# Patient Record
Sex: Female | Born: 1935
Health system: Southern US, Community
[De-identification: ages and names within clinical notes are randomized; demographics above are authoritative.]

## PROBLEM LIST (undated history)

## (undated) DIAGNOSIS — I10 Essential (primary) hypertension: Secondary | ICD-10-CM

## (undated) DIAGNOSIS — N189 Chronic kidney disease, unspecified: Secondary | ICD-10-CM

## (undated) DIAGNOSIS — E079 Disorder of thyroid, unspecified: Secondary | ICD-10-CM

## (undated) DIAGNOSIS — M1990 Unspecified osteoarthritis, unspecified site: Secondary | ICD-10-CM

## (undated) DIAGNOSIS — E785 Hyperlipidemia, unspecified: Secondary | ICD-10-CM

## (undated) DIAGNOSIS — H409 Unspecified glaucoma: Secondary | ICD-10-CM

## (undated) DIAGNOSIS — K635 Polyp of colon: Secondary | ICD-10-CM

## (undated) DIAGNOSIS — H269 Unspecified cataract: Secondary | ICD-10-CM

## (undated) DIAGNOSIS — R55 Syncope and collapse: Secondary | ICD-10-CM

## (undated) HISTORY — DX: Chronic kidney disease, unspecified: N18.9

## (undated) HISTORY — DX: Syncope and collapse: R55

## (undated) HISTORY — DX: Unspecified cataract: H26.9

## (undated) HISTORY — PX: INGUINAL HERNIA REPAIR: SUR1180

## (undated) HISTORY — PX: BLADDER SUSPENSION: SHX72

## (undated) HISTORY — PX: COLONOSCOPY: SHX174

## (undated) HISTORY — PX: ABDOMINAL HYSTERECTOMY: SHX81

## (undated) HISTORY — DX: Hyperlipidemia, unspecified: E78.5

## (undated) HISTORY — PX: EYE SURGERY: SHX253

## (undated) HISTORY — DX: Unspecified glaucoma: H40.9

## (undated) HISTORY — DX: Essential (primary) hypertension: I10

## (undated) HISTORY — PX: OTHER SURGICAL HISTORY: SHX169

## (undated) HISTORY — DX: Polyp of colon: K63.5

## (undated) HISTORY — DX: Unspecified osteoarthritis, unspecified site: M19.90

---

## 1998-08-20 ENCOUNTER — Ambulatory Visit (HOSPITAL_COMMUNITY): Admission: RE | Admit: 1998-08-20 | Discharge: 1998-08-20 | Payer: Self-pay | Admitting: Family Medicine

## 1999-09-27 ENCOUNTER — Ambulatory Visit (HOSPITAL_COMMUNITY): Admission: RE | Admit: 1999-09-27 | Discharge: 1999-09-27 | Payer: Self-pay

## 1999-10-18 ENCOUNTER — Encounter: Admission: RE | Admit: 1999-10-18 | Discharge: 2000-01-16 | Payer: Self-pay | Admitting: Family Medicine

## 2000-10-17 ENCOUNTER — Ambulatory Visit (HOSPITAL_COMMUNITY): Admission: RE | Admit: 2000-10-17 | Discharge: 2000-10-17 | Payer: Self-pay | Admitting: Family Medicine

## 2001-01-08 ENCOUNTER — Encounter: Admission: RE | Admit: 2001-01-08 | Discharge: 2001-01-08 | Payer: Self-pay | Admitting: Family Medicine

## 2001-02-05 ENCOUNTER — Encounter: Admission: RE | Admit: 2001-02-05 | Discharge: 2001-02-05 | Payer: Self-pay | Admitting: Family Medicine

## 2001-08-29 ENCOUNTER — Encounter: Admission: RE | Admit: 2001-08-29 | Discharge: 2001-08-29 | Payer: Self-pay | Admitting: Family Medicine

## 2001-09-05 ENCOUNTER — Encounter: Admission: RE | Admit: 2001-09-05 | Discharge: 2001-09-05 | Payer: Self-pay | Admitting: Family Medicine

## 2001-09-07 ENCOUNTER — Encounter: Admission: RE | Admit: 2001-09-07 | Discharge: 2001-10-31 | Payer: Self-pay | Admitting: Sports Medicine

## 2001-09-11 ENCOUNTER — Encounter: Admission: RE | Admit: 2001-09-11 | Discharge: 2001-09-11 | Payer: Self-pay | Admitting: Sports Medicine

## 2001-12-11 ENCOUNTER — Encounter: Admission: RE | Admit: 2001-12-11 | Discharge: 2001-12-11 | Payer: Self-pay | Admitting: Family Medicine

## 2001-12-14 ENCOUNTER — Encounter: Admission: RE | Admit: 2001-12-14 | Discharge: 2001-12-14 | Payer: Self-pay | Admitting: Family Medicine

## 2001-12-20 ENCOUNTER — Encounter: Payer: Self-pay | Admitting: Sports Medicine

## 2001-12-20 ENCOUNTER — Encounter: Admission: RE | Admit: 2001-12-20 | Discharge: 2001-12-20 | Payer: Self-pay | Admitting: Sports Medicine

## 2002-02-19 ENCOUNTER — Encounter (INDEPENDENT_AMBULATORY_CARE_PROVIDER_SITE_OTHER): Payer: Self-pay | Admitting: *Deleted

## 2002-02-19 LAB — CONVERTED CEMR LAB

## 2002-02-27 ENCOUNTER — Encounter: Admission: RE | Admit: 2002-02-27 | Discharge: 2002-02-27 | Payer: Self-pay | Admitting: Family Medicine

## 2002-03-04 ENCOUNTER — Encounter: Payer: Self-pay | Admitting: Sports Medicine

## 2002-03-04 ENCOUNTER — Encounter: Admission: RE | Admit: 2002-03-04 | Discharge: 2002-03-04 | Payer: Self-pay | Admitting: Sports Medicine

## 2002-05-10 ENCOUNTER — Encounter: Admission: RE | Admit: 2002-05-10 | Discharge: 2002-05-10 | Payer: Self-pay | Admitting: Family Medicine

## 2002-06-24 ENCOUNTER — Encounter: Admission: RE | Admit: 2002-06-24 | Discharge: 2002-06-24 | Payer: Self-pay | Admitting: Orthopaedic Surgery

## 2002-06-24 ENCOUNTER — Encounter: Payer: Self-pay | Admitting: Orthopaedic Surgery

## 2002-07-24 ENCOUNTER — Encounter: Admission: RE | Admit: 2002-07-24 | Discharge: 2002-07-24 | Payer: Self-pay | Admitting: Orthopaedic Surgery

## 2002-07-24 ENCOUNTER — Encounter: Payer: Self-pay | Admitting: Orthopaedic Surgery

## 2002-08-28 ENCOUNTER — Encounter: Admission: RE | Admit: 2002-08-28 | Discharge: 2002-08-28 | Payer: Self-pay | Admitting: Family Medicine

## 2002-09-05 ENCOUNTER — Encounter: Admission: RE | Admit: 2002-09-05 | Discharge: 2002-09-05 | Payer: Self-pay | Admitting: Family Medicine

## 2002-09-19 ENCOUNTER — Encounter: Admission: RE | Admit: 2002-09-19 | Discharge: 2002-09-19 | Payer: Self-pay | Admitting: Family Medicine

## 2002-10-11 ENCOUNTER — Encounter: Admission: RE | Admit: 2002-10-11 | Discharge: 2002-10-11 | Payer: Self-pay | Admitting: Family Medicine

## 2002-10-14 ENCOUNTER — Encounter: Admission: RE | Admit: 2002-10-14 | Discharge: 2002-10-14 | Payer: Self-pay | Admitting: Family Medicine

## 2002-11-11 ENCOUNTER — Encounter: Admission: RE | Admit: 2002-11-11 | Discharge: 2002-11-11 | Payer: Self-pay | Admitting: Family Medicine

## 2003-01-09 ENCOUNTER — Encounter: Admission: RE | Admit: 2003-01-09 | Discharge: 2003-01-09 | Payer: Self-pay | Admitting: Sports Medicine

## 2003-02-03 ENCOUNTER — Encounter: Admission: RE | Admit: 2003-02-03 | Discharge: 2003-02-03 | Payer: Self-pay | Admitting: Family Medicine

## 2003-02-25 ENCOUNTER — Encounter: Payer: Self-pay | Admitting: Otolaryngology

## 2003-02-25 ENCOUNTER — Encounter: Admission: RE | Admit: 2003-02-25 | Discharge: 2003-02-25 | Payer: Self-pay | Admitting: Otolaryngology

## 2003-04-11 ENCOUNTER — Emergency Department (HOSPITAL_COMMUNITY): Admission: EM | Admit: 2003-04-11 | Discharge: 2003-04-11 | Payer: Self-pay | Admitting: Emergency Medicine

## 2003-04-21 ENCOUNTER — Encounter: Admission: RE | Admit: 2003-04-21 | Discharge: 2003-04-21 | Payer: Self-pay | Admitting: Sports Medicine

## 2003-05-01 ENCOUNTER — Encounter: Admission: RE | Admit: 2003-05-01 | Discharge: 2003-05-01 | Payer: Self-pay | Admitting: Sports Medicine

## 2003-05-01 ENCOUNTER — Encounter: Payer: Self-pay | Admitting: Sports Medicine

## 2003-06-17 ENCOUNTER — Encounter: Admission: RE | Admit: 2003-06-17 | Discharge: 2003-06-17 | Payer: Self-pay | Admitting: Sports Medicine

## 2003-07-15 ENCOUNTER — Encounter: Admission: RE | Admit: 2003-07-15 | Discharge: 2003-07-15 | Payer: Self-pay | Admitting: Family Medicine

## 2003-12-24 ENCOUNTER — Encounter: Admission: RE | Admit: 2003-12-24 | Discharge: 2003-12-24 | Payer: Self-pay | Admitting: Family Medicine

## 2004-01-21 ENCOUNTER — Encounter: Admission: RE | Admit: 2004-01-21 | Discharge: 2004-01-21 | Payer: Self-pay | Admitting: Sports Medicine

## 2004-02-24 ENCOUNTER — Encounter: Admission: RE | Admit: 2004-02-24 | Discharge: 2004-02-24 | Payer: Self-pay | Admitting: Otolaryngology

## 2004-04-09 ENCOUNTER — Encounter: Admission: RE | Admit: 2004-04-09 | Discharge: 2004-04-09 | Payer: Self-pay | Admitting: Family Medicine

## 2004-06-15 ENCOUNTER — Ambulatory Visit: Payer: Self-pay | Admitting: Family Medicine

## 2004-07-05 ENCOUNTER — Ambulatory Visit: Payer: Self-pay | Admitting: Family Medicine

## 2004-07-27 ENCOUNTER — Ambulatory Visit: Payer: Self-pay | Admitting: Sports Medicine

## 2004-07-30 ENCOUNTER — Encounter: Admission: RE | Admit: 2004-07-30 | Discharge: 2004-07-30 | Payer: Self-pay | Admitting: Sports Medicine

## 2004-11-01 ENCOUNTER — Ambulatory Visit: Payer: Self-pay | Admitting: Sports Medicine

## 2005-01-05 ENCOUNTER — Ambulatory Visit: Payer: Self-pay | Admitting: Family Medicine

## 2005-07-25 ENCOUNTER — Ambulatory Visit: Payer: Self-pay | Admitting: Family Medicine

## 2005-08-04 ENCOUNTER — Ambulatory Visit: Payer: Self-pay | Admitting: Family Medicine

## 2005-08-17 ENCOUNTER — Ambulatory Visit: Payer: Self-pay | Admitting: Family Medicine

## 2005-10-19 ENCOUNTER — Ambulatory Visit: Payer: Self-pay | Admitting: Family Medicine

## 2005-11-04 ENCOUNTER — Encounter: Admission: RE | Admit: 2005-11-04 | Discharge: 2005-11-04 | Payer: Self-pay | Admitting: Sports Medicine

## 2006-01-30 ENCOUNTER — Ambulatory Visit: Payer: Self-pay | Admitting: Family Medicine

## 2006-06-08 ENCOUNTER — Ambulatory Visit: Payer: Self-pay | Admitting: Family Medicine

## 2006-06-13 ENCOUNTER — Ambulatory Visit: Payer: Self-pay | Admitting: Family Medicine

## 2006-06-15 ENCOUNTER — Encounter: Admission: RE | Admit: 2006-06-15 | Discharge: 2006-06-15 | Payer: Self-pay | Admitting: Sports Medicine

## 2006-08-30 ENCOUNTER — Ambulatory Visit: Payer: Self-pay | Admitting: Family Medicine

## 2006-08-31 ENCOUNTER — Encounter (INDEPENDENT_AMBULATORY_CARE_PROVIDER_SITE_OTHER): Payer: Self-pay | Admitting: *Deleted

## 2006-08-31 ENCOUNTER — Ambulatory Visit: Payer: Self-pay | Admitting: Family Medicine

## 2006-08-31 LAB — CONVERTED CEMR LAB
ALT: 23 units/L (ref 0–35)
BUN: 13 mg/dL (ref 6–23)
CO2: 28 meq/L (ref 19–32)
Calcium: 9.3 mg/dL (ref 8.4–10.5)
Chloride: 105 meq/L (ref 96–112)
Cholesterol: 230 mg/dL — ABNORMAL HIGH (ref 0–200)
Creatinine, Ser: 0.8 mg/dL (ref 0.40–1.20)
Glucose, Bld: 71 mg/dL (ref 70–99)
HDL: 82 mg/dL (ref >39)
Total CHOL/HDL Ratio: 2.8

## 2006-10-19 DIAGNOSIS — M159 Polyosteoarthritis, unspecified: Secondary | ICD-10-CM | POA: Insufficient documentation

## 2006-10-19 DIAGNOSIS — I10 Essential (primary) hypertension: Secondary | ICD-10-CM

## 2006-10-19 DIAGNOSIS — H409 Unspecified glaucoma: Secondary | ICD-10-CM | POA: Insufficient documentation

## 2006-10-19 DIAGNOSIS — M1710 Unilateral primary osteoarthritis, unspecified knee: Secondary | ICD-10-CM

## 2006-10-19 DIAGNOSIS — E7800 Pure hypercholesterolemia, unspecified: Secondary | ICD-10-CM

## 2006-10-19 DIAGNOSIS — E1140 Type 2 diabetes mellitus with diabetic neuropathy, unspecified: Secondary | ICD-10-CM | POA: Insufficient documentation

## 2006-10-19 DIAGNOSIS — E1169 Type 2 diabetes mellitus with other specified complication: Secondary | ICD-10-CM | POA: Insufficient documentation

## 2006-10-19 DIAGNOSIS — M8580 Other specified disorders of bone density and structure, unspecified site: Secondary | ICD-10-CM | POA: Insufficient documentation

## 2006-10-19 DIAGNOSIS — E669 Obesity, unspecified: Secondary | ICD-10-CM | POA: Insufficient documentation

## 2006-10-19 DIAGNOSIS — E1159 Type 2 diabetes mellitus with other circulatory complications: Secondary | ICD-10-CM | POA: Insufficient documentation

## 2006-10-20 ENCOUNTER — Encounter (INDEPENDENT_AMBULATORY_CARE_PROVIDER_SITE_OTHER): Payer: Self-pay | Admitting: *Deleted

## 2006-11-03 ENCOUNTER — Telehealth (INDEPENDENT_AMBULATORY_CARE_PROVIDER_SITE_OTHER): Payer: Self-pay | Admitting: *Deleted

## 2006-11-06 ENCOUNTER — Ambulatory Visit: Payer: Self-pay | Admitting: Family Medicine

## 2006-11-06 ENCOUNTER — Encounter (INDEPENDENT_AMBULATORY_CARE_PROVIDER_SITE_OTHER): Payer: Self-pay | Admitting: *Deleted

## 2006-11-09 ENCOUNTER — Encounter (INDEPENDENT_AMBULATORY_CARE_PROVIDER_SITE_OTHER): Payer: Self-pay | Admitting: *Deleted

## 2006-11-09 ENCOUNTER — Encounter: Admission: RE | Admit: 2006-11-09 | Discharge: 2006-11-09 | Payer: Self-pay | Admitting: Sports Medicine

## 2006-11-13 ENCOUNTER — Encounter: Admission: RE | Admit: 2006-11-13 | Discharge: 2006-11-13 | Payer: Self-pay | Admitting: Sports Medicine

## 2006-11-13 ENCOUNTER — Encounter (INDEPENDENT_AMBULATORY_CARE_PROVIDER_SITE_OTHER): Payer: Self-pay | Admitting: *Deleted

## 2006-11-15 ENCOUNTER — Encounter (INDEPENDENT_AMBULATORY_CARE_PROVIDER_SITE_OTHER): Payer: Self-pay | Admitting: *Deleted

## 2006-12-12 ENCOUNTER — Ambulatory Visit: Payer: Self-pay | Admitting: Family Medicine

## 2006-12-12 ENCOUNTER — Encounter (INDEPENDENT_AMBULATORY_CARE_PROVIDER_SITE_OTHER): Payer: Self-pay | Admitting: *Deleted

## 2006-12-13 ENCOUNTER — Encounter (INDEPENDENT_AMBULATORY_CARE_PROVIDER_SITE_OTHER): Payer: Self-pay | Admitting: *Deleted

## 2007-01-19 ENCOUNTER — Emergency Department (HOSPITAL_COMMUNITY): Admission: EM | Admit: 2007-01-19 | Discharge: 2007-01-19 | Payer: Self-pay | Admitting: Emergency Medicine

## 2007-02-12 ENCOUNTER — Ambulatory Visit: Payer: Self-pay | Admitting: Gastroenterology

## 2007-02-16 ENCOUNTER — Encounter: Payer: Self-pay | Admitting: Gastroenterology

## 2007-02-16 ENCOUNTER — Ambulatory Visit: Payer: Self-pay | Admitting: Gastroenterology

## 2007-02-16 DIAGNOSIS — D126 Benign neoplasm of colon, unspecified: Secondary | ICD-10-CM

## 2007-06-20 ENCOUNTER — Emergency Department (HOSPITAL_COMMUNITY): Admission: EM | Admit: 2007-06-20 | Discharge: 2007-06-21 | Payer: Self-pay | Admitting: Emergency Medicine

## 2007-06-27 ENCOUNTER — Ambulatory Visit: Payer: Self-pay | Admitting: Family Medicine

## 2007-07-02 ENCOUNTER — Encounter (INDEPENDENT_AMBULATORY_CARE_PROVIDER_SITE_OTHER): Payer: Self-pay | Admitting: *Deleted

## 2007-07-02 ENCOUNTER — Ambulatory Visit: Payer: Self-pay | Admitting: Family Medicine

## 2007-07-03 LAB — CONVERTED CEMR LAB
Albumin: 4.1 g/dL (ref 3.5–5.2)
CO2: 27 meq/L (ref 19–32)
Calcium: 9.2 mg/dL (ref 8.4–10.5)
Chloride: 102 meq/L (ref 96–112)
Direct LDL: 103 mg/dL — ABNORMAL HIGH
Glucose, Bld: 154 mg/dL — ABNORMAL HIGH (ref 70–99)
Sodium: 140 meq/L (ref 135–145)
Total Bilirubin: 0.6 mg/dL (ref 0.3–1.2)
Total Protein: 6.6 g/dL (ref 6.0–8.3)

## 2007-08-01 ENCOUNTER — Telehealth: Payer: Self-pay | Admitting: *Deleted

## 2007-08-06 ENCOUNTER — Telehealth: Payer: Self-pay | Admitting: *Deleted

## 2007-08-20 ENCOUNTER — Telehealth (INDEPENDENT_AMBULATORY_CARE_PROVIDER_SITE_OTHER): Payer: Self-pay | Admitting: *Deleted

## 2007-11-02 ENCOUNTER — Encounter (INDEPENDENT_AMBULATORY_CARE_PROVIDER_SITE_OTHER): Payer: Self-pay | Admitting: *Deleted

## 2007-11-02 ENCOUNTER — Ambulatory Visit: Payer: Self-pay | Admitting: Family Medicine

## 2007-11-02 DIAGNOSIS — L659 Nonscarring hair loss, unspecified: Secondary | ICD-10-CM | POA: Insufficient documentation

## 2007-11-02 LAB — CONVERTED CEMR LAB: Hgb A1c MFr Bld: 7.3 %

## 2007-11-03 ENCOUNTER — Encounter (INDEPENDENT_AMBULATORY_CARE_PROVIDER_SITE_OTHER): Payer: Self-pay | Admitting: *Deleted

## 2008-02-18 ENCOUNTER — Encounter: Admission: RE | Admit: 2008-02-18 | Discharge: 2008-02-18 | Payer: Self-pay | Admitting: *Deleted

## 2008-03-14 ENCOUNTER — Ambulatory Visit: Payer: Self-pay | Admitting: Family Medicine

## 2008-03-14 LAB — CONVERTED CEMR LAB: Hgb A1c MFr Bld: 8.7 %

## 2008-04-01 ENCOUNTER — Ambulatory Visit: Payer: Self-pay | Admitting: Family Medicine

## 2008-04-01 LAB — CONVERTED CEMR LAB

## 2008-04-07 ENCOUNTER — Encounter: Payer: Self-pay | Admitting: Family Medicine

## 2008-04-07 ENCOUNTER — Telehealth: Payer: Self-pay | Admitting: Family Medicine

## 2008-04-07 LAB — CONVERTED CEMR LAB
OCCULT 1: NEGATIVE
OCCULT 2: NEGATIVE
OCCULT 3: NEGATIVE

## 2008-06-09 ENCOUNTER — Ambulatory Visit: Payer: Self-pay | Admitting: Family Medicine

## 2008-06-09 ENCOUNTER — Telehealth (INDEPENDENT_AMBULATORY_CARE_PROVIDER_SITE_OTHER): Payer: Self-pay | Admitting: *Deleted

## 2008-06-09 LAB — CONVERTED CEMR LAB
Bilirubin Urine: NEGATIVE
Epithelial cells, urine: <5 /lpf
Glucose, Urine, Semiquant: >=1000
Ketones, urine, test strip: NEGATIVE
Specific Gravity, Urine: 1.025
pH: 6.5

## 2008-06-10 ENCOUNTER — Encounter: Payer: Self-pay | Admitting: Family Medicine

## 2008-06-19 ENCOUNTER — Encounter: Payer: Self-pay | Admitting: Family Medicine

## 2008-07-21 ENCOUNTER — Telehealth: Payer: Self-pay | Admitting: *Deleted

## 2008-07-21 ENCOUNTER — Ambulatory Visit: Payer: Self-pay | Admitting: Family Medicine

## 2008-07-21 LAB — CONVERTED CEMR LAB
Nitrite: NEGATIVE
Specific Gravity, Urine: 1.01
Urobilinogen, UA: 1
WBC Urine, dipstick: NEGATIVE

## 2008-07-22 ENCOUNTER — Encounter: Payer: Self-pay | Admitting: Family Medicine

## 2009-01-09 ENCOUNTER — Ambulatory Visit (HOSPITAL_COMMUNITY): Admission: RE | Admit: 2009-01-09 | Discharge: 2009-01-09 | Payer: Self-pay | Admitting: Urology

## 2009-02-03 ENCOUNTER — Ambulatory Visit (HOSPITAL_COMMUNITY): Admission: RE | Admit: 2009-02-03 | Discharge: 2009-02-04 | Payer: Self-pay | Admitting: Urology

## 2009-04-01 ENCOUNTER — Encounter: Admission: RE | Admit: 2009-04-01 | Discharge: 2009-04-01 | Payer: Self-pay | Admitting: Family Medicine

## 2009-04-01 ENCOUNTER — Telehealth: Payer: Self-pay | Admitting: Family Medicine

## 2009-04-03 ENCOUNTER — Ambulatory Visit: Payer: Self-pay | Admitting: Family Medicine

## 2009-04-03 ENCOUNTER — Ambulatory Visit (HOSPITAL_COMMUNITY): Admission: RE | Admit: 2009-04-03 | Discharge: 2009-04-03 | Payer: Self-pay | Admitting: Family Medicine

## 2009-04-03 ENCOUNTER — Encounter: Payer: Self-pay | Admitting: Family Medicine

## 2009-04-03 DIAGNOSIS — E1140 Type 2 diabetes mellitus with diabetic neuropathy, unspecified: Secondary | ICD-10-CM

## 2009-04-03 DIAGNOSIS — B351 Tinea unguium: Secondary | ICD-10-CM | POA: Insufficient documentation

## 2009-04-03 LAB — CONVERTED CEMR LAB
BUN: 16 mg/dL (ref 6–23)
CO2: 28 meq/L (ref 19–32)
Calcium: 9.2 mg/dL (ref 8.4–10.5)
Chloride: 104 meq/L (ref 96–112)
Cholesterol: 214 mg/dL — ABNORMAL HIGH (ref 0–200)
Creatinine, Ser: 0.99 mg/dL (ref 0.40–1.20)
Glucose, Bld: 169 mg/dL — ABNORMAL HIGH (ref 70–99)
HCT: 36.8 % (ref 36.0–46.0)
HDL: 82 mg/dL (ref >39)
Hemoglobin: 12 g/dL (ref 12.0–15.0)
Hgb A1c MFr Bld: 7.9 %
LDL Cholesterol: 124 mg/dL — ABNORMAL HIGH (ref 0–99)
MCHC: 32.6 g/dL (ref 30.0–36.0)
MCV: 95.6 fL (ref 78.0–100.0)
Platelets: 202 10*3/uL (ref 150–400)
Potassium: 4.3 meq/L (ref 3.5–5.3)
RBC: 3.85 M/uL — ABNORMAL LOW (ref 3.87–5.11)
RDW: 13.6 % (ref 11.5–15.5)
Sodium: 141 meq/L (ref 135–145)
Total CHOL/HDL Ratio: 2.6
Triglycerides: 42 mg/dL (ref <150)
VLDL: 8 mg/dL (ref 0–40)
WBC: 3.2 10*3/uL — ABNORMAL LOW (ref 4.0–10.5)

## 2009-04-06 ENCOUNTER — Encounter: Payer: Self-pay | Admitting: Family Medicine

## 2009-04-07 ENCOUNTER — Encounter: Admission: RE | Admit: 2009-04-07 | Discharge: 2009-04-07 | Payer: Self-pay | Admitting: Family Medicine

## 2009-04-07 ENCOUNTER — Encounter: Payer: Self-pay | Admitting: Family Medicine

## 2009-06-15 ENCOUNTER — Encounter: Payer: Self-pay | Admitting: *Deleted

## 2009-07-29 ENCOUNTER — Telehealth: Payer: Self-pay | Admitting: Family Medicine

## 2009-08-18 ENCOUNTER — Telehealth: Payer: Self-pay | Admitting: Family Medicine

## 2009-10-05 ENCOUNTER — Ambulatory Visit: Payer: Self-pay | Admitting: Family Medicine

## 2009-10-06 ENCOUNTER — Encounter: Admission: RE | Admit: 2009-10-06 | Discharge: 2009-10-06 | Payer: Self-pay | Admitting: Family Medicine

## 2009-12-02 ENCOUNTER — Encounter: Admission: RE | Admit: 2009-12-02 | Discharge: 2009-12-02 | Payer: Self-pay | Admitting: Family Medicine

## 2009-12-02 ENCOUNTER — Ambulatory Visit: Payer: Self-pay | Admitting: Family Medicine

## 2009-12-02 ENCOUNTER — Encounter: Payer: Self-pay | Admitting: Family Medicine

## 2009-12-02 LAB — CONVERTED CEMR LAB: Uric Acid, Serum: 4.2 mg/dL (ref 2.4–7.0)

## 2009-12-04 ENCOUNTER — Telehealth: Payer: Self-pay | Admitting: Family Medicine

## 2010-02-01 ENCOUNTER — Ambulatory Visit: Payer: Self-pay | Admitting: Family Medicine

## 2010-02-01 ENCOUNTER — Encounter: Payer: Self-pay | Admitting: Family Medicine

## 2010-02-01 LAB — CONVERTED CEMR LAB
ALT: 22 units/L (ref 0–35)
AST: 21 units/L (ref 0–37)
Albumin: 4 g/dL (ref 3.5–5.2)
Alkaline Phosphatase: 70 units/L (ref 39–117)
BUN: 17 mg/dL (ref 6–23)
Basophils Absolute: 0 10*3/uL (ref 0.0–0.1)
Basophils Relative: 1 % (ref 0–1)
CO2: 26 meq/L (ref 19–32)
Calcium: 9.1 mg/dL (ref 8.4–10.5)
Chloride: 101 meq/L (ref 96–112)
Creatinine, Ser: 0.9 mg/dL (ref 0.40–1.20)
Cyclic Citrullin Peptide Ab: <2 units (ref 0–5)
Eosinophils Absolute: 0.1 10*3/uL (ref 0.0–0.7)
Eosinophils Relative: 3 % (ref 0–5)
Glucose, Bld: 245 mg/dL — ABNORMAL HIGH (ref 70–99)
HCT: 36.2 % (ref 36.0–46.0)
Hemoglobin: 12.1 g/dL (ref 12.0–15.0)
Lymphocytes Relative: 25 % (ref 12–46)
Lymphs Abs: 0.8 10*3/uL (ref 0.7–4.0)
MCHC: 33.4 g/dL (ref 30.0–36.0)
MCV: 92.6 fL (ref 78.0–100.0)
Monocytes Absolute: 0.3 10*3/uL (ref 0.1–1.0)
Monocytes Relative: 10 % (ref 3–12)
Neutro Abs: 2 10*3/uL (ref 1.7–7.7)
Neutrophils Relative %: 62 % (ref 43–77)
Platelets: 231 10*3/uL (ref 150–400)
Potassium: 4.7 meq/L (ref 3.5–5.3)
RBC: 3.91 M/uL (ref 3.87–5.11)
RDW: 13 % (ref 11.5–15.5)
Rhuematoid fact SerPl-aCnc: <20 intl units/mL (ref 0–20)
Sodium: 137 meq/L (ref 135–145)
Total Bilirubin: 0.5 mg/dL (ref 0.3–1.2)
Total Protein: 6.3 g/dL (ref 6.0–8.3)
WBC: 3.2 10*3/uL — ABNORMAL LOW (ref 4.0–10.5)

## 2010-02-03 ENCOUNTER — Telehealth: Payer: Self-pay | Admitting: Family Medicine

## 2010-02-08 ENCOUNTER — Encounter: Admission: RE | Admit: 2010-02-08 | Discharge: 2010-02-08 | Payer: Self-pay | Admitting: Family Medicine

## 2010-02-11 ENCOUNTER — Telehealth: Payer: Self-pay | Admitting: Family Medicine

## 2010-02-17 ENCOUNTER — Ambulatory Visit: Payer: Self-pay | Admitting: Family Medicine

## 2010-02-17 LAB — CONVERTED CEMR LAB
Bilirubin Urine: NEGATIVE
Epithelial cells, urine: UNDETERMINED /lpf
Glucose, Urine, Semiquant: >=1000
Ketones, urine, test strip: NEGATIVE
Nitrite: NEGATIVE
Protein, U semiquant: 30
Specific Gravity, Urine: 1.02
Urobilinogen, UA: 0.2
pH: 5.5

## 2010-02-18 ENCOUNTER — Telehealth: Payer: Self-pay | Admitting: *Deleted

## 2010-02-18 ENCOUNTER — Encounter: Payer: Self-pay | Admitting: Family Medicine

## 2010-02-19 ENCOUNTER — Encounter: Admission: RE | Admit: 2010-02-19 | Discharge: 2010-02-19 | Payer: Self-pay | Admitting: Family Medicine

## 2010-02-25 ENCOUNTER — Encounter: Payer: Self-pay | Admitting: Family Medicine

## 2010-06-29 ENCOUNTER — Encounter: Admission: RE | Admit: 2010-06-29 | Discharge: 2010-06-29 | Payer: Self-pay | Admitting: Family Medicine

## 2010-07-20 ENCOUNTER — Ambulatory Visit: Payer: Self-pay | Admitting: Family Medicine

## 2010-07-20 ENCOUNTER — Telehealth: Payer: Self-pay | Admitting: *Deleted

## 2010-07-20 LAB — CONVERTED CEMR LAB
Bilirubin Urine: NEGATIVE
Glucose, Urine, Semiquant: 100
Ketones, urine, test strip: NEGATIVE
Nitrite: NEGATIVE
Protein, U semiquant: NEGATIVE
Specific Gravity, Urine: 1.015
Urobilinogen, UA: 0.2
pH: 6

## 2010-07-21 ENCOUNTER — Encounter: Payer: Self-pay | Admitting: Family Medicine

## 2010-07-30 ENCOUNTER — Telehealth: Payer: Self-pay | Admitting: Family Medicine

## 2010-09-21 NOTE — Progress Notes (Signed)
Summary: meds prob  Medications Added [facility] ASPIRIN 81 MG CHEW (ASPIRIN) Take 1 tablet by mouth once a day ENALAPRIL MALEATE 20 MG TABS (ENALAPRIL MALEATE) Take 1 tablet by mouth once a day LANTUS 100 UNIT/ML SOLN (INSULIN GLARGINE) Inject 30 unit subcutaneously once a day ZOCOR 40 MG TABS (SIMVASTATIN) Take 1 tablet by mouth at bedtime ZOCOR 80 MG TABS (SIMVASTATIN) One tab by mouth daily SENOKOT S 8.6-50 MG  TABS (SENNOSIDES-DOCUSATE SODIUM) 2 tabs by mouth two times a day DULCOLAX 10 MG  SUPP (BISACODYL) 1 suppository as needed for hard stools or no stools >3 days. MIRALAX   POWD (POLYETHYLENE GLYCOL 3350) 17g by mouth in 8 ounces fluid once daily AIMSCO INSULIN SYR ULTRA THIN 31G X 5/16" 0.3 ML  MISC (INSULIN SYRINGE-NEEDLE U-100) Use for injections as needed KEFLEX 500 MG CAPS (CEPHALEXIN) three times a day for 7 day KEFLEX 500 MG CAPS (CEPHALEXIN) three times a day for 7 day AMOXICILLIN 500 MG CAPS (AMOXICILLIN) three times a day for 7 days( Dx UTI) AMOXICILLIN 500 MG CAPS (AMOXICILLIN) three times a day for 7 days( Dx UTI) AMOXICILLIN 500 MG CAPS (AMOXICILLIN) three times a day for 10 days( Dx UTI) TESSALON PERLES 100 MG CAPS (BENZONATATE) take 1 pill up to 3 times days TESSALON PERLES 100 MG CAPS (BENZONATATE) take 1 pill up to 3 times days MOBIC 7.5 MG TABS (MELOXICAM) 1 tab by mouth daily for knee pain TRAMADOL HCL 50 MG  TABS (TRAMADOL HCL) one by mouth q 6 hours as needed pain KEFLEX 500 MG CAPS (CEPHALEXIN) one by mouth two times a day x 3 days       Phone Note Call from Patient Call back at Home Phone [phone]   Caller: Patient Summary of Call: pt states that the 2 rx's from yesterday went to wrong pharmacy - should be [facility] [address] Initial call taken by: De Nurse,  February 18, 2010 9:31 AM  Follow-up for Phone Call        changed & pt informed Follow-up by: Golden Circle RN,  February 18, 2010 9:41 AM    Prescriptions: KEFLEX 500 MG CAPS  (CEPHALEXIN) one by mouth two times a day x 3 days  #6 x 0   Entered by:   Golden Circle RN   Authorized by:   Helane Rima DO   Signed by:   Golden Circle RN on 02/18/2010   Method used:   Electronically to        [facility] [phone]* (retail)       [address]       [place], [place]  [address]       Ph: [phone]       Fax: [phone]   RxID:   [id number] MOBIC 7.5 MG TABS (MELOXICAM) 1 tab by mouth daily for knee pain  #90 x 0   Entered by:   Golden Circle RN   Authorized by:   Helane Rima DO   Signed by:   Golden Circle RN on 02/18/2010   Method used:   Electronically to        [facility] [phone]* (retail)       [address]       [place], [place]  [address]       Ph: [phone]       Fax: [phone]   RxID:   [id number]

## 2010-09-21 NOTE — Progress Notes (Signed)
Summary: UPDATED PROBLEM LIST

## 2010-09-21 NOTE — Assessment & Plan Note (Signed)
Summary: cloudy,bubbly urine//Kerie Badger   Vital Signs:  Patient profile:   77 year old female Height:      64 inches Weight:      183 pounds BMI:     31.53 Temp:     98.5 degrees F oral Pulse rate:   98 / minute BP sitting:   147 / 86  (left arm) Cuff size:   regular  Vitals Entered By: Tessie Fass CMA (February 17, 2010 3:50 PM) CC: cloudy urine, labs, DM Is Patient Diabetic? Yes Pain Assessment Patient in pain? no        Primary Care Provider:  Helane Rima DO  CC:  cloudy urine, labs, and DM.  History of Present Illness: 77 year old F:   1. Cloudy Urine: concerned about change in urine, denies dysuria, hematuria, abdominal pain, vaginal discharge, lesions, back pain, LE edema, weight gain, CP, SOB, change in BP, dehydration. No Hx of frequent UTIs.  2. Reviewed labs and studies from previous visit. Osteopenia on X-Ray. BG 245.  3. DM: checks BG each am, low 90s, high 140s, Rx Lantus 20 units daily, not watching diet or exercising.  Habits & Providers  Alcohol-Tobacco-Diet     Tobacco Status: quit  Current Medications (verified): 1)  Bayer Childrens Aspirin 81 Mg Chew (Aspirin) .... Take 1 Tablet By Mouth Once A Day 2)  Enalapril Maleate 20 Mg Tabs (Enalapril Maleate) .... Take 1 Tablet By Mouth Once A Day 3)  Lantus 100 Unit/ml Soln (Insulin Glargine) .... Inject 30 Unit Subcutaneously Once A Day 4)  Zocor 80 Mg Tabs (Simvastatin) .... One Tab By Mouth Daily 5)  Senokot S 8.6-50 Mg  Tabs (Sennosides-Docusate Sodium) .... 2 Tabs By Mouth Two Times A Day 6)  Dulcolax 10 Mg  Supp (Bisacodyl) .Marland Kitchen.. 1 Suppository As Needed For Hard Stools or No Stools >3 Days. 7)  Miralax   Powd (Polyethylene Glycol 3350) .Marland Kitchen.. 17g By Mouth in 8 Ounces Fluid Once Daily 8)  Aimsco Insulin Syr Ultra Thin 31g X 5/16" 0.3 Ml  Misc (Insulin Syringe-Needle U-100) .... Use For Injections As Needed 9)  Mobic 7.5 Mg Tabs (Meloxicam) .Marland Kitchen.. 1 Tab By Mouth Daily For Knee Pain 10)  Tramadol Hcl 50 Mg   Tabs (Tramadol Hcl) .... One By Mouth Q 6 Hours As Needed Pain 11)  Keflex 500 Mg Caps (Cephalexin) .... One By Mouth Two Times A Day X 3 Days  Allergies (verified): No Known Drug Allergies PMH-FH-SH reviewed for relevance  Review of Systems      See HPI  Physical Exam  General:  Well-developed,well-nourished,in no acute distress; alert,appropriate and cooperative throughout examination. Vitals reviewed.   Abdomen:  Bowel sounds positive,abdomen soft and non-tender without masses, organomegaly or hernias noted. Extremities:  No edema. Skin:  Intact without suspicious lesions or rashes. Psych:  Oriented X3, memory intact for recent and remote, normally interactive, good eye contact, and not anxious appearing.    Diabetes Management Exam:    Foot Exam (with socks and/or shoes not present):       Sensory-Pinprick/Light touch:          Left medial foot (L-4): normal          Left dorsal foot (L-5): normal          Left lateral foot (S-1): normal          Right medial foot (L-4): normal          Right dorsal foot (L-5): normal  Right lateral foot (S-1): normal       Sensory-Monofilament:          Left foot: normal          Right foot: normal       Inspection:          Left foot: normal          Right foot: normal       Nails:          Left foot: normal          Right foot: normal   Impression & Recommendations:  Problem # 1:  UTI (ICD-599.0) Assessment New  Urine sent for culture. Her updated medication list for this problem includes:    Keflex 500 Mg Caps (Cephalexin) ..... One by mouth two times a day x 3 days  Orders: [facility]- Est  Level 4 ([address]454)  Problem # 2:  DIABETES MELLITUS II, UNCOMPLICATED (ICD-250.00) Assessment: Unchanged  Advised patient to record BG at least two times a day for the next few weeks and to follow-up for review. New A1c will be due then as well.  No change to medications today. Her updated medication list for this problem includes:     Bayer Childrens Aspirin 81 Mg Chew (Aspirin) .Marland Kitchen... Take 1 tablet by mouth once a day    Enalapril Maleate 20 Mg Tabs (Enalapril maleate) .Marland Kitchen... Take 1 tablet by mouth once a day    Lantus 100 Unit/ml Soln (Insulin glargine) ..... Inject 30 unit subcutaneously once a day  Orders: [facility]- Est  Level 4 (09811)  Problem # 3:  OSTEOPENIA (ICD-733.90) Assessment: Unchanged  Ordered DEXA. Advised daily supplement with calcium plus vitamin D.   Orders: [facility]- Est  Level 4 (91478)  Complete Medication List: 1)  Bayer Childrens Aspirin 81 Mg Chew (Aspirin) .... Take 1 tablet by mouth once a day 2)  Enalapril Maleate 20 Mg Tabs (Enalapril maleate) .... Take 1 tablet by mouth once a day 3)  Lantus 100 Unit/ml Soln (Insulin glargine) .... Inject 30 unit subcutaneously once a day 4)  Zocor 80 Mg Tabs (Simvastatin) .... One tab by mouth daily 5)  Senokot S 8.6-50 Mg Tabs (Sennosides-docusate sodium) .... 2 tabs by mouth two times a day 6)  Dulcolax 10 Mg Supp (Bisacodyl) .Marland Kitchen.. 1 suppository as needed for hard stools or no stools >3 days. 7)  Miralax Powd (Polyethylene glycol 3350) .Marland Kitchen.. 17g by mouth in 8 ounces fluid once daily 8)  Aimsco Insulin Syr Ultra Thin 31g X 5/16" 0.3 Ml Misc (Insulin syringe-needle u-100) .... Use for injections as needed 9)  Mobic 7.5 Mg Tabs (Meloxicam) .Marland Kitchen.. 1 tab by mouth daily for knee pain 10)  Tramadol Hcl 50 Mg Tabs (Tramadol hcl) .... One by mouth q 6 hours as needed pain 11)  Keflex 500 Mg Caps (Cephalexin) .... One by mouth two times a day x 3 days  Other Orders: Urinalysis-[facility] (00000) Urine Culture-[facility] ([address]562-13086) Dexa scan (Dexa scan)  Patient Instructions: 1)  You have a Urinary Tract Infection. I am prescribing Keflex for this. 2)  Check your blood sugar two-three times daily or the next 3 weeks and follow up so that we can review them. 3)  We will check your bones for osteoporosis. I suggest taking a vitamin D (800 IU) plus calcium supplement (1200 mg) every  day.  Prescriptions: KEFLEX 500 MG CAPS (CEPHALEXIN) one by mouth two times a day x 3 days  #6 x 0  Entered and Authorized by:   Helane Rima DO   Signed by:   Helane Rima DO on 02/17/2010   Method used:   Electronically to        [facility]  [address] [id number]* (retail)       [address]       [place], [place]  [address]       Ph: [phone]       Fax: [phone]   RxID:   [id number] MOBIC 7.5 MG TABS (MELOXICAM) 1 tab by mouth daily for knee pain  #90 x 0   Entered and Authorized by:   Helane Rima DO   Signed by:   Helane Rima DO on 02/17/2010   Method used:   Print then Give to Patient   RxID:   [id number] MOBIC 7.5 MG TABS (MELOXICAM) 1 tab by mouth daily for knee pain  #90 x 0   Entered and Authorized by:   Helane Rima DO   Signed by:   Helane Rima DO on 02/17/2010   Method used:   Electronically to        [facility]  [address] [id number] (retail)       [address]       [place], [place]  [address]       Ph: [phone]       Fax: [phone]   RxID:   [id number]   Prevention & Chronic Care Immunizations   Influenza vaccine: Fluvax 3+  (06/09/2008)   Influenza vaccine deferral: Not available  (02/17/2010)   Influenza vaccine due: 06/09/2009    Tetanus booster: 08/22/2001: Done.   Tetanus booster due: 08/23/2011    Pneumococcal vaccine: Done.  (08/22/1998)   Pneumococcal vaccine due: None    H. zoster vaccine: Not documented  Colorectal Screening   Hemoccult: normal  (04/07/2008)   Hemoccult action/deferral: Not indicated  (02/17/2010)   Hemoccult due: 04/07/2009    Colonoscopy: Done.  (02/19/2001)   Colonoscopy due: 02/20/2011  Other Screening   Pap smear: Done.  (02/19/2002)   Pap smear due: Not Indicated    Mammogram: BI-RADS CATEGORY 2:  Benign finding(s).^MM DIGITAL DIAG LTD L  (04/07/2009)   Mammogram due: 02/14/2009    DXA bone density scan: Done.  (12/25/2003)   DXA bone density  action/deferral: Ordered  (02/17/2010)   DXA scan due: None    Smoking status: quit  (02/17/2010)  Diabetes Mellitus   HgbA1C: 8.0  (12/02/2009)   Hemoglobin A1C due: 02/02/2008    Eye exam: Not documented    Foot exam: yes  (02/17/2010)   Foot exam action/deferral: Do today   High risk foot: Not documented   Foot care education: Not documented   Foot exam due: 03/14/2009    Urine microalbumin/creatinine ratio: Not documented   Urine microalbumin action/deferral: Not indicated   Urine microalbumin/cr due: 04/01/2009    Diabetes flowsheet reviewed?: Yes   Progress toward A1C goal: Unchanged  Lipids   Total Cholesterol: 214  (04/03/2009)   LDL: 124  (04/03/2009)   LDL Direct: 103  (07/02/2007)   HDL: 82  (04/03/2009)   Triglycerides: 42  (04/03/2009)    SGOT (AST): 21  (02/01/2010)   SGPT (ALT): 22  (02/01/2010)   Alkaline phosphatase: 70  (02/01/2010)   Total bilirubin: 0.5  (02/01/2010)   Liver panel due: 07/04/2009    Lipid flowsheet reviewed?: Yes  Progress toward LDL goal: Unchanged  Hypertension   Last Blood Pressure: 147 / 86  (02/17/2010)   Serum creatinine: 0.90  (02/01/2010)   Serum potassium 4.7  (02/01/2010)    Hypertension flowsheet reviewed?: Yes   Progress toward BP goal: Deteriorated  Self-Management Support :   Personal Goals (by the next clinic visit) :     Personal A1C goal: 7  (02/17/2010)     Personal blood pressure goal: 130/80  (02/17/2010)     Personal LDL goal: 130  (02/17/2010)    Patient will work on the following items until the next clinic visit to reach self-care goals:     Medications and monitoring: take my medicines every day, bring all of my medications to every visit  (02/17/2010)     Eating: drink diet soda or water instead of juice or soda, eat more vegetables, use fresh or frozen vegetables, eat foods that are low in salt, eat baked foods instead of fried foods, eat fruit for snacks and desserts, limit or avoid alcohol   (02/17/2010)     Activity: take a 30 minute walk every day, take the stairs instead of the elevator, park at the far end of the parking lot  (02/17/2010)    Diabetes self-management support: Written self-care plan  (02/17/2010)   Diabetes care plan printed    Hypertension self-management support: Written self-care plan  (02/17/2010)   Hypertension self-care plan printed.    Lipid self-management support: Written self-care plan  (02/17/2010)   Lipid self-care plan printed.   Nursing Instructions: Schedule screening DXA bone density scan (see order)    Laboratory Results   Urine Tests  Date/Time Received: February 17, 2010 4:21 PM  Date/Time Reported: February 17, 2010 4:37 PM   Routine Urinalysis   Color: yellow Appearance: Cloudy Glucose: >=1000   (Normal Range: Negative) Bilirubin: negative   (Normal Range: Negative) Ketone: negative   (Normal Range: Negative) Spec. Gravity: 1.020   (Normal Range: 1.003-1.035) Blood: moderate   (Normal Range: Negative) pH: 5.5   (Normal Range: 5.0-8.0) Protein: 30   (Normal Range: Negative) Urobilinogen: 0.2   (Normal Range: 0-1) Nitrite: negative   (Normal Range: Negative) Leukocyte Esterace: large   (Normal Range: Negative)  Urine Microscopic WBC/HPF: loaded RBC/HPF: few present but unable to count due to large number of RBC's Bacteria/HPF: cocci in chains Epithelial/HPF: unable to see through [facility]    Comments: Ellery Plunk MD  February 17, 2010 4:39 PM

## 2010-09-21 NOTE — Assessment & Plan Note (Signed)
Summary: diabetes wp   Vital Signs:  Patient Profile:   77 Years Old Female Height:     62 inches Weight:      182.13 pounds BMI:     33.43 Temp:     98.4 degrees F Pulse rate:   80 / minute BP sitting:   122 / 70  Pt. in pain?   no  Vitals Entered By: Dennison Nancy RN (March 14, 2008 1:42 PM)              Is Patient Diabetic? Yes      PCP:  Angeline Slim MD   History of Present Illness: Hayley Fisher is a very pleasant 77 year old african [place] lady with:  1. DM, controlled with insulin: Hayley Fisher has been a diabetic for > 10 years. She checks her blood sugar every am and pm. She notes that her am sugars are normally 120-150 and that her pm sugars run a little higher at 150-200. She has had a few episodes of low blood sugar in the am. During those, she feels weak and shakey until she is able to get breakfast.   EYE: she sees her optho twice yearly and has an appt in Sept. FOOT: She checks her feet regularly.  2. HTN: The patient's BP was 122/70 in clinic today. She says that when she checks it at home it is usually good like today. She takes her Enalapril consistently and needs a new prescription today.  3. HLD: She has been on Zocor. Looking back in her records, I saw that she stopped taking this med earlier in the year as she was afraid that the Zocor was causing hair loss. She states that she has been back on the medication since speaking with her previous doctor.   4. Weight gain: Hayley Fisher says that she has gained weight over the past few months, but that she has been exercising regularly for the past few weeks. She does aerobics and/or weights MWF and walks on TTh.   5. Peripheral neuropathy: She does note some numbness and tingling in her hands and feet.     Diabetes Management History:      She says that she is exercising.       Prior Medications Reviewed Using: Patient Recall  Prior Medication List:  BAYER CHILDRENS ASPIRIN 81 MG CHEW (ASPIRIN) Take 1  tablet by mouth once a day ENALAPRIL MALEATE 20 MG TABS (ENALAPRIL MALEATE) Take 1 tablet by mouth once a day LANTUS 100 UNIT/ML SOLN (INSULIN GLARGINE) Inject 30 unit subcutaneously once a day ZOCOR 80 MG TABS (SIMVASTATIN) One tab by mouth daily SENOKOT S 8.6-50 MG  TABS (SENNOSIDES-DOCUSATE SODIUM) 2 tabs by mouth two times a day DULCOLAX 10 MG  SUPP (BISACODYL) 1 suppository as needed for hard stools or no stools >3 days. 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       Post menopause       DM shoes 2005       cardiolite- low risk, QGS=71% - 06/22/2004       HTN  HLD       Obesity       Peripheral Neuropathy  Past Surgical History:    Reviewed history from 12/19/2007 and no changes required:       Inguinal hernia age 13 -       laser cataract repair - 11/20/2004       TAH age 66 -       tympanostomy tube L ear - 08/23/2003       Sinus surgery       Hysterectomy       Hernia Surgery   Family History:    Reviewed history from 10/19/2006 and no changes required:       DTR has hypothyroidism, Father died of HTN and CVA age 52, Mother died of Colon CA age 32, siblings have DM  Social History:    Reviewed history from 10/19/2006 and no changes required:       Remote tobacco use, quit > 20 years ago.  No etoh use. Lives with her sister in a house in [place] with central air. Has four children.  Retired 2/05 and enjoying church work and gardening.  Son was a [employer] and captain of [employer] team Hayley Fisher) who played in the [employer] in the 1980s.   Risk Factors:  Tobacco use:  quit Drug use:  no Alcohol use:  no Exercise:   yes   Review of Systems       The patient complains of weight gain.  The patient denies fever, vision loss, chest pain, syncope, dyspnea on exertion, peripheral edema, prolonged cough, headaches, abdominal pain, severe indigestion/heartburn, muscle weakness, difficulty walking, and depression.     Physical Exam  General:     alert, well-developed, well-nourished, well-hydrated, appropriate dress, normal appearance, healthy-appearing, cooperative to examination, and good hygiene.   Head:     normocephalic and atraumatic.   Eyes:     vision grossly intact, pupils equal, pupils round, and pupils reactive to light.   Ears:     R ear normal and L ear normal.   Neck:     supple, full ROM, and no masses.   Lungs:     normal respiratory effort, normal breath sounds, no dullness, no crackles, and no wheezes.   Heart:     normal rate, regular rhythm, no murmur, no gallop, and no rub.   Abdomen:     soft, non-tender, normal bowel sounds, and no masses.   Msk:     normal ROM and no joint tenderness.   Pulses:     R dorsalis pedis normal and L dorsalis pedis normal.   Neurologic:     alert & oriented X3 and cranial nerves II-XII intact.   Psych:     Oriented X3.    Diabetes Management Exam:    Foot Exam (with socks and/or shoes not present):       Sensory-Pinprick/Light touch:          Left medial foot (L-4): normal          Left dorsal foot (L-5): normal          Left lateral foot (S-1): normal       Sensory-Monofilament:          Left foot: normal       Inspection:          Left foot: normal       Nails:          Left foot: normal  Impression & Recommendations:  Problem # 1:  DIABETES MELLITUS II, UNCOMPLICATED (ICD-250.00) Her A1 c was elevated at 8.7 today. We discussed her diet and new exercise routine. I will follow-up with another A1c in 3 months. No medication change at this time. Her updated medication list for this problem includes:    Bayer Childrens Aspirin 81  Mg Chew (Aspirin) .Marland Kitchen... Take 1 tablet by mouth once a day    Enalapril Maleate 20 Mg Tabs (Enalapril maleate) .Marland Kitchen... Take 1 tablet by mouth once a day    Lantus 100 Unit/ml Soln (Insulin glargine) ..... Inject 30 unit subcutaneously once a day  Orders: A1C-[facility] (16109) [facility]- Est  Level 4 (60454)  Future Orders: CBC w/Diff-[facility] (09811) ... 05/16/2008 Basic Met-[facility] ([id number]) ... 05/16/2008 UA Microalbumin-[facility] (13086) ... 05/16/2008   Problem # 2:  HYPERLIPIDEMIA (ICD-272.4) I am checking a lipid panel today because the patient was not previously taking her medication regularly. Her updated medication list for this problem includes:    Zocor 80 Mg Tabs (Simvastatin) ..... One tab by mouth daily  Orders: Lipid-[facility] ([id number]) [facility]- Est  Level 4 (28413)  Future Orders: CBC w/Diff-[facility] (24401) ... 05/16/2008 Lipid-[facility] ([id number]) ... 05/16/2008   Problem # 3:  HYPERTENSION, BENIGN SYSTEMIC (ICD-401.1) Stable, Refill Enalapril. Her updated medication list for this problem includes:    Enalapril Maleate 20 Mg Tabs (Enalapril maleate) .Marland Kitchen... Take 1 tablet by mouth once a day  Orders: [facility]- Est  Level 4 (03474)   Problem # 4:  OBESITY, NOS (ICD-278.00) I encouraged the patient to continue with her exercise routine. Orders: [facility]- Est  Level 4 (25956)  Future Orders: CBC w/Diff-[facility] (38756) ... 05/16/2008   Problem # 5:  GLAUCOMA (ICD-365.9) Assessment: Comment Only She will follow up with her eye doctor in Sept. Orders: [facility]- Est  Level 4 (43329)   Problem # 6:  PERIPHERAL NEUROPATHY Assessment: Unchanged No medication at this time. The patient states that she has tried Neurontin in the past and that it did not help with the pain, but only made her sleepy. I let her know that if it continues or worsens to let me know and that we can try Lyrica.  Complete Medication List: 1)  Bayer Childrens Aspirin 81 Mg Chew (Aspirin) .... Take 1 tablet by mouth once a day 2)  Enalapril  Maleate 20 Mg Tabs (Enalapril maleate) .... Take 1 tablet by mouth once a day 3)  Lantus 100 Unit/ml Soln (Insulin glargine) .... Inject 30 unit subcutaneously once a day 4)  Zocor 80 Mg Tabs (Simvastatin) .... One tab by mouth daily 5)  Senokot S 8.6-50 Mg Tabs (Sennosides-docusate sodium) .... 2 tabs by mouth two times a day 6)  Dulcolax 10 Mg Supp (Bisacodyl) .Marland Kitchen.. 1 suppository as needed for hard stools or no stools >3 days. 7)  Miralax Powd (Polyethylene glycol 3350) .Marland Kitchen.. 17g by mouth in 8 ounces fluid once daily 8)  Aimsco Insulin Syr Ultra Thin 31g X 5/16" 0.3 Ml Misc (Insulin syringe-needle u-100) .... Use for injections as needed  Diabetes Management Assessment/Plan:      The following lipid goals have been established for the patient: Total cholesterol goal of 200; LDL cholesterol goal of 100; HDL cholesterol goal of 40; Triglyceride goal of 200.     Complete Medication List: 1)  Bayer Childrens Aspirin 81 Mg Chew (Aspirin) .... Take 1 tablet by mouth once a day 2)  Enalapril Maleate 20 Mg Tabs (Enalapril maleate) .... Take 1 tablet by mouth  once a day 3)  Lantus 100 Unit/ml Soln (Insulin glargine) .... Inject 30 unit subcutaneously once a day 4)  Zocor 80 Mg Tabs (Simvastatin) .... One tab by mouth daily 5)  Senokot S 8.6-50 Mg Tabs (Sennosides-docusate sodium) .... 2 tabs by mouth two times a day 6)  Dulcolax 10 Mg Supp (Bisacodyl) .Marland Kitchen.. 1 suppository as needed for hard stools or no stools >3 days. 7)  Miralax Powd (Polyethylene glycol 3350) .Marland Kitchen.. 17g by mouth in 8 ounces fluid once daily 8)  Aimsco Insulin Syr Ultra Thin 31g X 5/16" 0.3 Ml Misc (Insulin syringe-needle u-100) .... Use for injections as needed  Diabetes Management Assessment/Plan:      The following lipid goals have been established for the patient: Total cholesterol goal of 200; LDL cholesterol goal of 100; HDL cholesterol goal of 40; Triglyceride goal of 200.     Patient Instructions: 1)  Please schedule a  follow-up appointment in 3 months. 2)  It is important that you exercise regularly at least 20 minutes 5 times a week. If you develop chest pain, have severe difficulty breathing, or feel very tired , stop exercising immediately and seek medical attention. 3)  You need to lose weight. Consider a lower calorie diet and regular exercise.  4)  Check your blood sugars regularly. If your readings are usually above : or below 70 you should contact our office. 5)  It is important that your Diabetic A1c level is checked every 3 months. 6)  Check your feet each night for sore areas, calluses or signs of infection. 7)  BMP prior to visit, ICD-9: 8)  Lipid Panel prior to visit, ICD-9: 9)  CBC w/ Diff prior to visit, ICD-9: 10)  HbgA1C prior to visit, ICD-9: 11)  Urine Microalbumin prior to visit, ICD-9:   Prescriptions: ENALAPRIL MALEATE 20 MG TABS (ENALAPRIL MALEATE) Take 1 tablet by mouth once a day  #30 x 11   Entered and Authorized by:   Helane Rima MD   Signed by:   Helane Rima MD on 03/14/2008   Method used:   Electronically sent to ...       [address]*       [address]       [place], [place]  [address]       Ph: [phone]       Fax: [phone]   RxID:   [id number]  ]  Vital Signs:  Patient Profile:   77 Years Old Female Height:     62 inches Weight:      182.13 pounds BMI:     33.43 Temp:     98.4 degrees F Pulse rate:   80 / minute BP sitting:   122 / 70                 Laboratory Results   Blood Tests   Date/Time Received: March 14, 2008 1:38 PM  Date/Time Reported: March 14, 2008 1:57 PM   HGBA1C: 8.7%   (Normal Range: Non-Diabetic - 3-6%   Control Diabetic - 6-8%)  Comments: ...........test performed by...........Marland KitchenTerese Door, CMA

## 2010-09-21 NOTE — Assessment & Plan Note (Signed)
Summary: f/up,tcb   Vital Signs:  Patient profile:   77 year old female Height:      62 inches Weight:      178.19 pounds BMI:     32.71 Temp:     97.0 degrees F oral Pulse rate:   87 / minute Pulse rhythm:   regular BP sitting:   116 / 80  (right arm)  Vitals Entered By: Modesta Messing LPN (April 03, 2009 8:53 AM) CC: Follow up visit for DM. Is Patient Diabetic? Yes  Pain Assessment Patient in pain? no        Diabetic Foot Exam Foot Inspection Is there a history of a foot ulcer?              No Is there a foot ulcer now?              No Is there swelling or an abnormal foot shape?          No Are the toenails long?                Yes Are the toenails thick?                Yes Are the toenails ingrown?              No Is there heavy callous build-up?              No Is there pain in the calf muscle (Intermittent claudication) when walking?    NoIs there a claw toe deformity?              No Is there elevated skin temperature?            No Is there limited ankle dorsiflexion?            No Is there foot or ankle muscle weakness?            No  Diabetic Foot Care Education Pulse Check          Right Foot          Left Foot Posterior Tibial:        normal            normal Dorsalis Pedis:        normal            normal    10-g (5.07) Semmes-Weinstein Monofilament Test           Right Foot          Left Foot Visual Inspection               Test Control      normal         normal Site 1         normal         normal Site 2         normal         normal Site 3         normal         normal Site 4         normal         normal Site 5         normal         normal Site 6         normal         normal Site 7  normal         normal Site 8         normal         normal Site 9         normal         normal Site 10         normal         normal  Impression      normal         normal   Primary Care Provider:  Helane Rima DO  CC:  Follow up visit for  DM.Marland Kitchen  History of Present Illness: Hayley Fisher is a pleasant 77 year old AAF presenting for f/u of DM, HTN, HLD.  1. HTN: Controlled. Rx Enalapril, ASA. She endorses mild achy midsternal CP, lasts few seconds, after some meals; not associated with SOB, diaphoresis, or exertion; no radiation to arm or jaw, no N/V/D, no LE edema, no sour taste in mouth, no Hx of reflux or MI. The patient also endorses bilateral intermittent leg cramps x 2 over the past few months. She states that she was evaluated in the hospital for this about 2 years ago. Pain is in legs and thighs, better with walking.  2. HLD: She admits that she has not been taking Zocor, stating "I was just on too many medicines and I think that it made my hair fall out." Last FLP 2008.  3. DM: A1c 7.1 on 11/09. Rx: Lantus 20 units in am. Checks BG two times a day. This am = 142 which the patient states is high for her. Lows x 2 (63,55) in am, felt "nervous," ate breakfast and felt better. + peripheral neuropathy, has been on neurontin in the past but was too sedating, does not want medications. saw eye doctor last month, has glaucoma. followed by podiatrist, has onychomycosis.  4. Weight: has been making better food choices and has lost 5 pounds since her last visit. She has not been exercising.  5. Health Maintenance: recent abnormal mammogram with possible mass left breast. she will be undergoing [place]. colonoscopy: patient does not know when last done, due 5 years (2 polyps removed). Due: BMP, FLP, A1c.      Current Medications (verified): 1)  Bayer Childrens Aspirin 81 Mg Chew (Aspirin) .... Take 1 Tablet By Mouth Once A Day 2)  Enalapril Maleate 20 Mg Tabs (Enalapril Maleate) .... Take 1 Tablet By Mouth Once A Day 3)  Lantus 100 Unit/ml Soln (Insulin Glargine) .... Inject 30 Unit Subcutaneously Once A Day 4)  Zocor 80 Mg Tabs (Simvastatin) .... One Tab By Mouth Daily 5)  Senokot S 8.6-50 Mg  Tabs (Sennosides-Docusate Sodium) .... 2  Tabs By Mouth Two Times A Day 6)  Dulcolax 10 Mg  Supp (Bisacodyl) .Marland Kitchen.. 1 Suppository As Needed For Hard Stools or No Stools >3 Days. 7)  Miralax   Powd (Polyethylene Glycol 3350) .Marland Kitchen.. 17g By Mouth in 8 Ounces Fluid Once Daily 8)  Aimsco Insulin Syr Ultra Thin 31g X 5/16" 0.3 Ml  Misc (Insulin Syringe-Needle U-100) .... Use For Injections As Needed 9)  Keflex 500 Mg Caps (Cephalexin) .... Three Times A Day For 7 Day 10)  Amoxicillin 500 Mg Caps (Amoxicillin) .... Three Times A Day For 7 Days( Dx Uti)  Allergies (verified): No Known Drug Allergies  Past History:  Family History: Last updated: 10/19/2006 DTR has hypothyroidism, Father died of HTN and CVA age 31, Mother died of Colon CA age 75,  siblings have DM  Social History: Last updated: 03/14/2008 Remote tobacco use, quit > 20 years ago.  No etoh use. Lives with her sister in a house in [place] with central air. Has four children.  Retired 2/05 and enjoying church work and gardening.  Son was a [employer] and captain of [employer] team Aleanna Menge) who played in the [employer] in the 1980s.  Past Medical History: G4P4 Leg length discrepency R > L Post menopause DM  Cardiolite- low risk, QGS=71% - 06/22/2004 HTN HLD Obesity Peripheral Neuropathy Glaucoma Onychomycosis  Past Surgical History: Inguinal hernia age 67 - laser cataract repair - 11/20/2004 TAH age 46 - tympanostomy tube L ear - 08/23/2003 Sinus surgery Hysterectomy Hernia Surgery Bladder Sling  Review of Systems       per HPI, otherwise negative  Physical Exam  General:  Well-developed, well-nourished, in no acute distress; alert, appropriate and cooperative throughout examination. Vital signs reviewed. Neck:  Supple, full ROM, and no masses.   Chest Wall:  Nontender to palpation. Lungs:  Normal respiratory effort, chest expands symmetrically. Lungs are clear to auscultation, no crackles or wheezes. Heart:  normal rate, regular rhythm, and grade  2/6 systolic murmur.   Pulses:  R dorsalis pedis normal and L dorsalis pedis normal.   Extremities:  no edema Psych:  Oriented X3, memory intact for recent and remote, normally interactive, good eye contact, and not anxious appearing.    Diabetes Management Exam:    Foot Exam (with socks and/or shoes not present):       Sensory-Monofilament:          Left foot: normal          Right foot: normal   Impression & Recommendations:  Problem # 1:  CHEST PAIN, ATYPICAL (ICD-786.59) Assessment New Atypical CP with normal EKG, reviewed with preceptor. Review of chart shows cardiolyte in 2005 and was low risk. Discussed this with patient, gave RED FLAGS.   Orders: 12 Lead EKG (12 Lead EKG) Cardiology Referral (Cardiology) [facility]- Est  Level 4 ([address]109)  Problem # 2:  DIABETES MELLITUS II, UNCOMPLICATED (ICD-250.00) Assessment: Deteriorated A1c 7.9. Reviewed diet and exercise recommendations. Will not increase Lantus as patient with hypoglycemic episodes. Consider Metformin if labs normal.  Her updated medication list for this problem includes:    Bayer Childrens Aspirin 81 Mg Chew (Aspirin) .Marland Kitchen... Take 1 tablet by mouth once a day    Enalapril Maleate 20 Mg Tabs (Enalapril maleate) .Marland Kitchen... Take 1 tablet by mouth once a day    Lantus 100 Unit/ml Soln (Insulin glargine) ..... Inject 30 unit subcutaneously once a day  Orders: A1C-[facility] (60454) Basic Met-[facility] (09811-91478) Podiatry Referral (Podiatry) Cardiology Referral (Cardiology) [facility]- Est  Level 4 (29562)  Problem # 3:  HYPERTENSION, BENIGN SYSTEMIC (ICD-401.1) Assessment: Improved Continue current treatment. Will check BMP. Her updated medication list for this problem includes:    Enalapril Maleate 20 Mg Tabs (Enalapril maleate) .Marland Kitchen... Take 1 tablet by mouth once a day  Orders: Basic Met-[facility] (13086-57846) Cardiology Referral (Cardiology) [facility]- Est  Level 4 (96295)  Problem # 4:  HYPERLIPIDEMIA (ICD-272.4) Assessment: Unchanged Patient  has not been taking medication. Will recheck FLP today. (Rx: Zocor 80 mg by mouth daily) Her updated medication list for this problem includes:    Zocor 80 Mg Tabs (Simvastatin) ..... One tab by mouth daily  Orders: Lipid-[facility] (28413-24401) Cardiology Referral (Cardiology) [facility]- Est  Level 4 (02725)  Problem # 5:  LEG CRAMPS (ICD-729.82) Assessment: Unchanged NO RED  FLAGS. Will check BMP.  Problem # 6:  GLAUCOMA (ICD-365.9) Assessment: Unchanged Followed by optho q 6 moths.  Problem # 7:  OBESITY, NOS (ICD-278.00) Discussed and encouraged exercise in the form of walking.  Orders: [facility]- Est  Level 4 (25366)  Problem # 8:  PERIPHERAL NEUROPATHY (ICD-356.9) Assessment: Unchanged Patient refuses medication at this time.  Problem # 9:  ONYCHOMYCOSIS, TOENAILS (ICD-110.1) Assessment: Unchanged Referral to podiatry for diabetic foot care including cutting toenails and possibly getting diabetic shoes.  Problem # 10:  Preventive Health Care (ICD-V70.0) Will f/u last colonoscopy. Will f/u breast US results. Labs today.  Complete Medication List: 1)  Bayer Childrens Aspirin 81 Mg Chew (Aspirin) .... Take 1 tablet by mouth once a day 2)  Enalapril Maleate 20 Mg Tabs (Enalapril maleate) .... Take 1 tablet by mouth once a day 3)  Lantus 100 Unit/ml Soln (Insulin glargine) .... Inject 30 unit subcutaneously once a day 4)  Zocor 80 Mg Tabs (Simvastatin) .... One tab by mouth daily 5)  Senokot S 8.6-50 Mg Tabs (Sennosides-docusate sodium) .... 2 tabs by mouth two times a day 6)  Dulcolax 10 Mg Supp (Bisacodyl) .Marland Kitchen.. 1 suppository as needed for hard stools or no stools >3 days. 7)  Miralax Powd (Polyethylene glycol 3350) .Marland Kitchen.. 17g by mouth in 8 ounces fluid once daily 8)  Aimsco Insulin Syr Ultra Thin 31g X 5/16" 0.3 Ml Misc (Insulin syringe-needle u-100) .... Use for injections as needed 9)  Keflex 500 Mg Caps (Cephalexin) .... Three times a day for 7 day 10)  Amoxicillin 500 Mg Caps  (Amoxicillin) .... Three times a day for 7 days( dx uti)  Other Orders: CBC-[facility] (44034)  Patient Instructions: 1)  It was great to see you today! 2)  Your EKG looked normal today. Please seek medical care if you have chest pain, especially if it comes when you are exerting yourself, or if it is associated with problems breathing, sweating, or nausea. 3)  We will let you know if any of your labs are abnormal. 4)  Keep up the good work with the weight loss! 5)  I will let you know if you are due for a colonoscopy. 6)  We are going to refer you to a podiatrist that takes Medicare. 7)  Call me if you have not been contacted about further breast imaging in 2-3 days. 8)  Follow up in 3 months or sooner if you have problems.  Laboratory Results   Blood Tests   Date/Time Received: April 03, 2009 9:01 AM  Date/Time Reported: April 03, 2009 9:53 AM   HGBA1C: 7.9%   (Normal Range: Non-Diabetic - 3-6%   Control Diabetic - 6-8%)  Comments: ...............test performed by......Marland KitchenBonnie A. [place], [place] (ASCP)      Prevention & Chronic Care Immunizations   Influenza vaccine: Fluvax 3+  (06/09/2008)   Influenza vaccine due: 06/09/2009    Tetanus booster: 08/22/2001: Done.   Tetanus booster due: 08/23/2011    Pneumococcal vaccine: Done.  (08/22/1998)   Pneumococcal vaccine due: None    H. zoster vaccine: Not documented  Colorectal Screening   Hemoccult: normal  (04/07/2008)   Hemoccult due: 04/07/2009    Colonoscopy: Done.  (02/19/2001)   Colonoscopy due: 02/20/2011  Other Screening   Pap smear: Done.  (02/19/2002)   Pap smear due: Not Indicated    Mammogram: mammograms for comparison - BI-RADS 0^MM DIGITAL SCREENING  (04/01/2009)   Mammogram due: 02/14/2009    DXA bone density scan: Done.  (  12/25/2003)   DXA scan due: None   Reports requested:  Smoking status: quit  (03/14/2008)  Diabetes Mellitus   HgbA1C: 7.9  (04/03/2009)   Hemoglobin A1C due: 02/02/2008     Eye exam: Not documented   Last eye exam report requested.    Foot exam: yes  (04/03/2009)   Foot exam action/deferral: Do today   High risk foot: Not documented   Foot care education: Not documented   Foot exam due: 03/14/2009    Urine microalbumin/creatinine ratio: Not documented   Urine microalbumin/cr due: 04/01/2009    Diabetes flowsheet reviewed?: Yes   Progress toward A1C goal: Deteriorated  Lipids   Total Cholesterol: 230  (08/31/2006)   LDL: 140  (08/31/2006)   LDL Direct: 103  (07/02/2007)   HDL: 82  (08/31/2006)   Triglycerides: 42  (08/31/2006)    SGOT (AST): 18  (07/02/2007)   SGPT (ALT): 15  (07/02/2007)   Alkaline phosphatase: 59  (07/02/2007)   Total bilirubin: 0.6  (07/02/2007)   Liver panel due: 07/04/2009    Lipid flowsheet reviewed?: Yes   Progress toward LDL goal: Unchanged  Hypertension   Last Blood Pressure: 116 / 80  (04/03/2009)   Serum creatinine: 0.92  (07/02/2007)   Serum potassium 4.4  (07/02/2007)    Hypertension flowsheet reviewed?: Yes   Progress toward BP goal: At goal  Self-Management Support :    Diabetes self-management support: Not documented    Hypertension self-management support: Not documented    Lipid self-management support: Not documented    Nursing Instructions: Diabetic foot exam today Request report of last diabetic eye exam

## 2010-09-21 NOTE — Assessment & Plan Note (Signed)
Summary: leg pain,tcb   Vital Signs:  Patient profile:   77 year old female Height:      64 inches Weight:      182 pounds BMI:     31.35 Temp:     99 degrees F Pulse rate:   95 / minute BP sitting:   136 / 82  (right arm) Cuff size:   regular  Vitals Entered By: Dennison Nancy RN (December 02, 2009 1:34 PM) CC: Right leg pain Is Patient Diabetic? Yes Pain Assessment Patient in pain? yes     Location: knee Intensity: 8   Primary Care Provider:  Helane Rima DO  CC:  Right leg pain.  History of Present Illness: 77 y/o F Dm, HTN, Obesity presents with R leg pain "on and off" for over a year, "feels like it's going to give away."  Painful at knee and travels upwards to hip.  If she sits for a long time, it's difficult to get back up.  No swelling.  +tingling sensation for years.  No falls.  Can bear weight.  Mostly pain in R knee when she goes up and down steps.    Habits & Providers  Alcohol-Tobacco-Diet     Tobacco Status: never  Current Medications (verified): 1)  Bayer Childrens Aspirin 81 Mg Chew (Aspirin) .... Take 1 Tablet By Mouth Once A Day 2)  Enalapril Maleate 20 Mg Tabs (Enalapril Maleate) .... Take 1 Tablet By Mouth Once A Day 3)  Lantus 100 Unit/ml Soln (Insulin Glargine) .... Inject 30 Unit Subcutaneously Once A Day 4)  Zocor 80 Mg Tabs (Simvastatin) .... One Tab By Mouth Daily 5)  Senokot S 8.6-50 Mg  Tabs (Sennosides-Docusate Sodium) .... 2 Tabs By Mouth Two Times A Day 6)  Dulcolax 10 Mg  Supp (Bisacodyl) .Marland Kitchen.. 1 Suppository As Needed For Hard Stools or No Stools >3 Days. 7)  Miralax   Powd (Polyethylene Glycol 3350) .Marland Kitchen.. 17g By Mouth in 8 Ounces Fluid Once Daily 8)  Aimsco Insulin Syr Ultra Thin 31g X 5/16" 0.3 Ml  Misc (Insulin Syringe-Needle U-100) .... Use For Injections As Needed 9)  Tessalon Perles 100 Mg Caps (Benzonatate) .... Take 1 Pill Up To 3 Times Days  Allergies (verified): No Known Drug Allergies  Past History:  Past Medical History: Last  updated: 04/03/2009 G4P4 Leg length discrepency R > L Post menopause DM  Cardiolite- low risk, QGS=71% - 06/22/2004 HTN HLD Obesity Peripheral Neuropathy Glaucoma Onychomycosis  Past Surgical History: Last updated: 04/03/2009 Inguinal hernia age 55 - laser cataract repair - 11/20/2004 TAH age 68 - tympanostomy tube L ear - 08/23/2003 Sinus surgery Hysterectomy Hernia Surgery Bladder Sling  Family History: Last updated: 10/19/2006 DTR has hypothyroidism, Father died of HTN and CVA age 65, Mother died of Colon CA age 13, siblings have DM  Social History: Last updated: 03/14/2008 Remote tobacco use, quit > 20 years ago.  No etoh use. Lives with her sister in a house in [place] with central air. Has four children.  Retired 2/05 and enjoying church work and gardening.  Son was a [employer] and captain of [employer] team Hendy Brindle) who played in the [employer] in the 1980s.  Risk Factors: Exercise: yes (03/14/2008)  Risk Factors: Smoking Status: never (12/02/2009)  Social History: Smoking Status:  never  Physical Exam  General:  Well-developed,well-nourished,in no acute distress; alert,appropriate and cooperative throughout examination. vitals reviewed.   Msk:  Right Knee: Normal to inspection with no erythema or obvious bony  abnormalities. + Effusion  + mild warmth to palpation + medial joint line tenderness  no patellar tenderness or condyle tenderness. ROM normal in flexion and extension and lower leg rotation. Ligaments with solid consistent endpoints including ACL, PCL, LCL, MCL. Negative Mcmurray's and provocative meniscal tests. Non painful patellar compression. Patellar and quadriceps tendons unremarkable. Hamstring and quadriceps strength is normal.      Impression & Recommendations:  Problem # 1:  KNEE PAIN, RIGHT (ICD-719.46) Assessment New Knee pain may be secondary to arthritic process vs gout vs infectious process.  Most likely not gout  or infectious.  Will get xray today to check for OA.  Will also check uric acid level.  Will try mobic for 2 wks. If not improved, we may try steroid injection.  Pt agreed to this plan.   Her updated medication list for this problem includes:    Mobic 7.5 Mg Tabs (Meloxicam) .Marland Kitchen... 1 tab by mouth daily for knee pain  Orders: Radiology other (Radiology Other) [facility]- Est Level  3 (16109) Uric Acid-[facility] (60454-09811)  Problem # 2:  DIABETES MELLITUS II, UNCOMPLICATED (ICD-250.00) Assessment: Comment Only Will check A1C today as pt has not had it checked since 03/2009.  Will defer to PCP for changes if necessary.   Her updated medication list for this problem includes:    Bayer Childrens Aspirin 81 Mg Chew (Aspirin) .Marland Kitchen... Take 1 tablet by mouth once a day    Enalapril Maleate 20 Mg Tabs (Enalapril maleate) .Marland Kitchen... Take 1 tablet by mouth once a day    Lantus 100 Unit/ml Soln (Insulin glargine) ..... Inject 30 unit subcutaneously once a day  Orders: A1C-[facility] (91478)  Complete Medication List: 1)  Bayer Childrens Aspirin 81 Mg Chew (Aspirin) .... Take 1 tablet by mouth once a day 2)  Enalapril Maleate 20 Mg Tabs (Enalapril maleate) .... Take 1 tablet by mouth once a day 3)  Lantus 100 Unit/ml Soln (Insulin glargine) .... Inject 30 unit subcutaneously once a day 4)  Zocor 80 Mg Tabs (Simvastatin) .... One tab by mouth daily 5)  Senokot S 8.6-50 Mg Tabs (Sennosides-docusate sodium) .... 2 tabs by mouth two times a day 6)  Dulcolax 10 Mg Supp (Bisacodyl) .Marland Kitchen.. 1 suppository as needed for hard stools or no stools >3 days. 7)  Miralax Powd (Polyethylene glycol 3350) .Marland Kitchen.. 17g by mouth in 8 ounces fluid once daily 8)  Aimsco Insulin Syr Ultra Thin 31g X 5/16" 0.3 Ml Misc (Insulin syringe-needle u-100) .... Use for injections as needed 9)  Mobic 7.5 Mg Tabs (Meloxicam) .Marland Kitchen.. 1 tab by mouth daily for knee pain  Patient Instructions: 1)  Please schedule a follow-up appointment in 2 weeks if still having knee  pain.  You can see Dr Earlene Plater or Dr Janalyn Harder if Dr Earlene Plater is not available.  2)  I will prescribe Mobic for your knee pain.  Take this once a day. 3)  If this does not help your pain, please come back in 2 wks.  We may need to try an injection into the knee.  Prescriptions: MOBIC 7.5 MG TABS (MELOXICAM) 1 tab by mouth daily for knee pain  #30 x 2   Entered and Authorized by:   Angeline Slim MD   Signed by:   Angeline Slim MD on 12/02/2009   Method used:   Electronically to        [facility] [phone]* (retail)       [address]  [place], [place]  [address]       Ph: [phone]       Fax: [phone]   RxID:   [id number]   Appended Document: A1c  8.0 %     Lab Visit  Laboratory Results   Blood Tests   Date/Time Received: December 02, 2009 2:02 PM  Date/Time Reported: December 02, 2009 2:40 PM   HGBA1C: 8.0%   (Normal Range: Non-Diabetic - 3-6%   Control Diabetic - 6-8%)  Comments: ...............test performed by......Marland KitchenBonnie A. [place], MLS (ASCP)cm    Orders Today:

## 2010-09-21 NOTE — Assessment & Plan Note (Signed)
Summary: cough   Vital Signs:  Patient profile:   77 year old female Weight:      178.5 pounds Temp:     98.7 degrees F oral Pulse rate:   85 / minute Pulse rhythm:   regular BP sitting:   121 / 51  (right arm) Cuff size:   regular  Vitals Entered By: Loralee Pacas CMA (October 05, 2009 1:36 PM)  Primary Care Provider:  Helane Rima DO  CC:  cough x 4 days.  History of Present Illness: 77 y/o F with a cough for the last 4 days. It started with a sore throat and a cough. It is worse at night. She is a little nauseated with eating but she continues to try to eat and is drinking well. She is drinking water, tea and Ginger Ale. She says that she has been taking Diabetic Tussin q4 hr for the cough and it's helping a little. She did not get a flu shot, but is not havin any aching or fever. She is not feeling SOB but is tired and walks more slowly. She is still using 2 pillows (her usual) to sleep. Pt says that when she coughs she has pain that shoots down her sides.   Current Medications (verified): 1)  Bayer Childrens Aspirin 81 Mg Chew (Aspirin) .... Take 1 Tablet By Mouth Once A Day 2)  Enalapril Maleate 20 Mg Tabs (Enalapril Maleate) .... Take 1 Tablet By Mouth Once A Day 3)  Lantus 100 Unit/ml Soln (Insulin Glargine) .... Inject 30 Unit Subcutaneously Once A Day 4)  Zocor 80 Mg Tabs (Simvastatin) .... One Tab By Mouth Daily 5)  Senokot S 8.6-50 Mg  Tabs (Sennosides-Docusate Sodium) .... 2 Tabs By Mouth Two Times A Day 6)  Dulcolax 10 Mg  Supp (Bisacodyl) .Marland Kitchen.. 1 Suppository As Needed For Hard Stools or No Stools >3 Days. 7)  Miralax   Powd (Polyethylene Glycol 3350) .Marland Kitchen.. 17g By Mouth in 8 Ounces Fluid Once Daily 8)  Aimsco Insulin Syr Ultra Thin 31g X 5/16" 0.3 Ml  Misc (Insulin Syringe-Needle U-100) .... Use For Injections As Needed 9)  Tessalon Perles 100 Mg Caps (Benzonatate) .... Take 1 Pill Up To 3 Times Days  Allergies (verified): No Known Drug Allergies  Review of  Systems        vitals reviewed and pertinent negatives and positives seen in HPI   Physical Exam  General:  Well-developed,well-nourished,in no acute distress; alert,appropriate and cooperative throughout examination Mouth:  Oral mucosa and oropharynx without lesions or exudates.  Teeth in good repair. Lungs:  end expiratory wheeze/rhonchi heard at first and then cleared with deep breaths.  Heart:  Normal rate and regular rhythm. S1 and S2 normal without gallop, murmur, click, rub or other extra sounds.   Impression & Recommendations:  Problem # 1:  COUGH (ICD-786.2) Assessment New Pt has been taking ACE-I for 4 years so this is not likely the cause of her cough. She likely ha a viral illness that is causing the cough. PT was given Rx for [employer]. Will also get CXR to r/o mild CHF or PNA as the cause. If pt has questionable CXR would bring the patient back and get a BNP. At this time my suspicion is low enough that I will not order it.   Orders: CXR- [facility]view (CXR) [facility]- Est Level  3 ([address]109)  Complete Medication List: 1)  Bayer Childrens Aspirin 81 Mg Chew (Aspirin) .... Take 1 tablet by mouth once a  day 2)  Enalapril Maleate 20 Mg Tabs (Enalapril maleate) .... Take 1 tablet by mouth once a day 3)  Lantus 100 Unit/ml Soln (Insulin glargine) .... Inject 30 unit subcutaneously once a day 4)  Zocor 80 Mg Tabs (Simvastatin) .... One tab by mouth daily 5)  Senokot S 8.6-50 Mg Tabs (Sennosides-docusate sodium) .... 2 tabs by mouth two times a day 6)  Dulcolax 10 Mg Supp (Bisacodyl) .Marland Kitchen.. 1 suppository as needed for hard stools or no stools >3 days. 7)  Miralax Powd (Polyethylene glycol 3350) .Marland Kitchen.. 17g by mouth in 8 ounces fluid once daily 8)  Aimsco Insulin Syr Ultra Thin 31g X 5/16" 0.3 Ml Misc (Insulin syringe-needle u-100) .... Use for injections as needed 9)  Tessalon Perles 100 Mg Caps (Benzonatate) .... Take 1 pill up to 3 times days  Patient Instructions: 1)  Continue to use  Diabetic Tussin, Use the Tesslon Perrles at night.  2)  Got get chest x-ray today or tomorrow. 3)  Take deep breaths every 15 mintues (during comomercials) 4)  If you have a hard time breathing call our office or go to the ED.  Prescriptions: TESSALON PERLES 100 MG CAPS (BENZONATATE) take 1 pill up to 3 times days  #40 x 1   Entered and Authorized by:   Jamie Brookes MD   Signed by:   Jamie Brookes MD on 10/05/2009   Method used:   Electronically to        [facility] [phone]* (retail)       [address]       [place], [place]  [address]       Ph: [phone]       Fax: [phone]   RxID:   [id number]

## 2010-09-21 NOTE — Assessment & Plan Note (Signed)
Summary: uti?,df   Vital Signs:  Patient profile:   77 year old female Height:      64 inches Weight:      179 pounds BMI:     30.84 Pulse rate:   98 / minute BP sitting:   153 / 97  (left arm) Cuff size:   regular  Vitals Entered By: Tessie Fass CMA (July 20, 2010 2:42 PM)  Primary Care Provider:  Helane Rima DO  CC:  dysuria.  History of Present Illness: 77 yo F:  1. Dysuria: x 2-3 days, associated with urinary frequency. Denies fever/chills, N/V/D, abdominal pain, back pain, rash. Not sexually active. Postmenopausal. Endorses mild vaginal itching with no discharge. + DM, high 200 and low 55. Controlled on Lantus 20 units daily.  Current Medications (verified): 1)  Bayer Childrens Aspirin 81 Mg Chew (Aspirin) .... Take 1 Tablet By Mouth Once A Day 2)  Enalapril Maleate 20 Mg Tabs (Enalapril Maleate) .... Take 1 Tablet By Mouth Once A Day 3)  Lantus 100 Unit/ml Soln (Insulin Glargine) .... Inject 30 Unit Subcutaneously Once A Day 4)  Zocor 80 Mg Tabs (Simvastatin) .... One Tab By Mouth Daily 5)  Senokot S 8.6-50 Mg  Tabs (Sennosides-Docusate Sodium) .... 2 Tabs By Mouth Two Times A Day 6)  Dulcolax 10 Mg  Supp (Bisacodyl) .Marland Kitchen.. 1 Suppository As Needed For Hard Stools or No Stools >3 Days. 7)  Miralax   Powd (Polyethylene Glycol 3350) .Marland Kitchen.. 17g By Mouth in 8 Ounces Fluid Once Daily 8)  Aimsco Insulin Syr Ultra Thin 31g X 5/16" 0.3 Ml  Misc (Insulin Syringe-Needle U-100) .... Use For Injections As Needed 9)  Mobic 7.5 Mg Tabs (Meloxicam) .Marland Kitchen.. 1 Tab By Mouth Daily For Knee Pain 10)  Tramadol Hcl 50 Mg  Tabs (Tramadol Hcl) .... One By Mouth Q 6 Hours As Needed Pain 11)  Keflex 500 Mg Caps (Cephalexin) .... One By Mouth Two Times A Day X 3 Days 12)  Caltrate 600+d 600-400 Mg-Unit  Tabs (Calcium Carbonate-Vitamin D) .... Take 1 Tablet By Mouth Two Times A Day  Allergies (verified): No Known Drug Allergies PMH-FH-SH reviewed for relevance  Review of Systems      See  HPI  Physical Exam  General:  Well-developed,well-nourished,in no acute distress; alert,appropriate and cooperative throughout examination. Vitals reviewed.   Abdomen:  Bowel sounds positive,abdomen soft and non-tender without masses, organomegaly or hernias noted. Genitalia:  Pelvic Exam:        External: normal female genitalia without lesions or masses, atrophic changes        Vagina: normal without lesions or masses        Cervix: normal without lesions or masses           Impression & Recommendations:  Problem # 1:  DYSURIA (ICD-788.1) Assessment New UCx. Rx Keflex. Her updated medication list for this problem includes:    Keflex 500 Mg Caps (Cephalexin) ..... One by mouth two times a day x 3 days  Orders: Urinalysis-[facility] (00000) Urine Culture-[facility] ([address]-60[address]) [facility]- Est Level  3 (09811)  Problem # 2:  UNSPECIFIED VAGINITIS AND VULVOVAGINITIS (ICD-616.10) Assessment: New Likely 2/2 atrophic changes versus hygeine as patient washing vagina with soap. Discussed hygeine.  Her updated medication list for this problem includes:    Keflex 500 Mg Caps (Cephalexin) ..... One by mouth two times a day x 3 days  Orders: Wet Prep- [facility] (87210) [facility]- Est Level  3 (91478)  Problem # 3:  DIABETES MELLITUS  II, UNCOMPLICATED (ICD-250.00) Assessment: Unchanged  Her updated medication list for this problem includes:    Bayer Childrens Aspirin 81 Mg Chew (Aspirin) .Marland Kitchen... Take 1 tablet by mouth once a day    Enalapril Maleate 20 Mg Tabs (Enalapril maleate) .Marland Kitchen... Take 1 tablet by mouth once a day    Lantus 100 Unit/ml Soln (Insulin glargine) ..... Inject 30 unit subcutaneously once a day  Complete Medication List: 1)  Bayer Childrens Aspirin 81 Mg Chew (Aspirin) .... Take 1 tablet by mouth once a day 2)  Enalapril Maleate 20 Mg Tabs (Enalapril maleate) .... Take 1 tablet by mouth once a day 3)  Lantus 100 Unit/ml Soln (Insulin glargine) .... Inject 30 unit subcutaneously once a day 4)   Zocor 80 Mg Tabs (Simvastatin) .... One tab by mouth daily 5)  Senokot S 8.6-50 Mg Tabs (Sennosides-docusate sodium) .... 2 tabs by mouth two times a day 6)  Dulcolax 10 Mg Supp (Bisacodyl) .Marland Kitchen.. 1 suppository as needed for hard stools or no stools >3 days. 7)  Miralax Powd (Polyethylene glycol 3350) .Marland Kitchen.. 17g by mouth in 8 ounces fluid once daily 8)  Aimsco Insulin Syr Ultra Thin 31g X 5/16" 0.3 Ml Misc (Insulin syringe-needle u-100) .... Use for injections as needed 9)  Mobic 7.5 Mg Tabs (Meloxicam) .Marland Kitchen.. 1 tab by mouth daily for knee pain 10)  Tramadol Hcl 50 Mg Tabs (Tramadol hcl) .... One by mouth q 6 hours as needed pain 11)  Keflex 500 Mg Caps (Cephalexin) .... One by mouth two times a day x 3 days 12)  Caltrate 600+d 600-400 Mg-unit Tabs (Calcium carbonate-vitamin d) .... Take 1 tablet by mouth two times a day Prescriptions: KEFLEX 500 MG CAPS (CEPHALEXIN) one by mouth two times a day x 3 days  #6 x 0   Entered and Authorized by:   Helane Rima DO   Signed by:   Helane Rima DO on 07/20/2010   Method used:   Electronically to        [facility] [phone]* (retail)       [address]       [place], [place]  [address]       Ph: [phone]       Fax: [phone]   RxID:   [id number]    Orders Added: 1)  Urinalysis-[facility] [00000] 2)  Wet Prep- [facility] [87210] 3)  Urine Culture-[facility] [[id number]] 4)  [facility]- Est Level  3 [47425]    Laboratory Results   Urine Tests  Date/Time Received: July 20, 2010 2:48 PM  Date/Time Reported: July 20, 2010 3:09 PM   Routine Urinalysis   Color: yellow Appearance: Clear Glucose: 100   (Normal Range: Negative) Bilirubin: negative   (Normal Range: Negative) Ketone: negative   (Normal Range: Negative) Spec. Gravity: 1.015   (Normal Range: 1.003-1.035) Blood: trace-intact   (Normal Range: Negative) pH: 6.0   (Normal Range: 5.0-8.0) Protein: negative   (Normal Range: Negative) Urobilinogen: 0.2   (Normal Range: 0-1) Nitrite:  negative   (Normal Range: Negative) Leukocyte Esterace: moderate   (Normal Range: Negative)  Urine Microscopic WBC/HPF: 1-5 RBC/HPF: rare Bacteria/HPF: 2+ Epithelial/HPF: 1-5    Comments: ...........test performed by...........Marland KitchenTerese Door, CMA  Date/Time Received: July 20, 2010 3:06 PM  Date/Time Reported: July 20, 2010 3:13 PM   [employer] Source: vaginal WBC/hpf: 1-5 Bacteria/hpf: 3+ Clue cells/hpf: none Yeast/hpf: none Trichomonas/hpf: none Comments: ...........test performed by...........Marland KitchenTerese Door, CMA

## 2010-09-21 NOTE — Progress Notes (Signed)
Summary: phn msg   Phone Note Other Incoming Call back at [phone]   Caller: [facility] Summary of Call: needs to know if the pts. testing frequency has changed over the last year.  The ref # is [id number] Initial call taken by: Clydell Hakim,  April 01, 2009 9:34 AM  Follow-up for Phone Call        will send  message to MD. Follow-up by: Theresia Lo RN,  April 01, 2009 1:00 PM  Additional Follow-up for Phone Call Additional follow up Details #1::        please clarify Additional Follow-up by: Helane Rima MD,  April 01, 2009 2:01 PM     Appended Document: phn msg pt needs appt for f/up DM. last A1C 11-09

## 2010-09-21 NOTE — Progress Notes (Signed)
Summary: Rx Req   Phone Note Refill Request Call back at Home Phone [phone] Message from:  Patient  Refills Requested: Medication #1:  ENALAPRIL MALEATE 20 MG TABS Take 1 tablet by mouth once a day PT USES [facility] [address]  Initial call taken by: Clydell Hakim,  July 29, 2009 2:17 PM  Follow-up for Phone Call        will forward to MD. Follow-up by: Theresia Lo RN,  July 29, 2009 2:20 PM    Prescriptions: ENALAPRIL MALEATE 20 MG TABS (ENALAPRIL MALEATE) Take 1 tablet by mouth once a day  #30 x 11   Entered and Authorized by:   Helane Rima DO   Signed by:   Helane Rima DO on 07/29/2009   Method used:   Electronically to        [facility] [phone]* (retail)       [address]       [place], [place]  [address]       Ph: [phone]       Fax: [phone]   RxID:   [id number]

## 2010-09-21 NOTE — Letter (Signed)
Summary: Results Letter  [facility]  [address]   [place], [place] [address]   Phone: [phone]  Fax: [phone]    02/25/2010  Hayley Fisher [address] [place], [place]  [address]  Dear [name],  Your recent bone scan shows that you have low bone mass, this is also known as PRE-Osteoporosis. I recommend that you take a supplement with 1200 mg of calcium and 800 International Units of vitamin D daily to protect your bones. We can discuss starting a medication for this at your next visit.   Sincerely,   Helane Rima DO  Appended Document: Results Letter mailed.

## 2010-09-21 NOTE — Progress Notes (Signed)
Summary: triage   Phone Note Call from Patient Call back at Home Phone [phone]   Caller: Patient Summary of Call: Pt having frequent urination. Initial call taken by: Clydell Hakim,  August 18, 2009 9:18 AM  Follow-up for Phone Call        c/o frequent urination. voiding hourly. denies burning,itching or pain. started last week. wanted something called in. told her she will need to be seen. States she has been drinking a lot.  states she had a "bladder tack" a few months ago. asked her to call her urologist. if they are unable to see her, call back. she agrees that urologist id the md to consult for this complaint Follow-up by: Golden Circle RN,  August 18, 2009 9:26 AM

## 2010-09-21 NOTE — Progress Notes (Signed)
Summary: Xray result, A1C reseult   Phone Note Outgoing Call   Call placed by: Shloimy Michalski MD,  December 04, 2009 8:49 AM Call placed to: Insurer Summary of Call: Called pt about Xray result (Right knee with degernerative disesase, effusion, +osteophyte).  Pt took Mobic for first time today so does not know if it helped pain. Discussed A1C with pt.  Advised pt to come to [facility] to see Dr Earlene Plater re management of her diabetes.  Pt agreed. Initial call taken by: Raiden Haydu MD,  December 04, 2009 8:52 AM    Called pt regar

## 2010-09-21 NOTE — Progress Notes (Signed)
Summary: triage   Phone Note Call from Patient Call back at Home Phone [phone]   Caller: Patient Summary of Call: The pain medicine that Dr. Earlene Plater gave her on Monday is making her feel strange. Initial call taken by: Clydell Hakim,  February 03, 2010 8:52 AM  Follow-up for Phone Call        just got the rx yesterday. took 1st at 2pm. felt like she was drunk, drowsy & nauseous. still feels funny this am. told her not to take another until I call her with md advice. it may help if she takes it with food. will ask md about cutting it in half. pt states tylenol & ibuprofen do not help. Follow-up by: Golden Circle RN,  February 03, 2010 9:00 AM  Additional Follow-up for Phone Call Additional follow up Details #1::        Patient called Emergency Line Regarding followup on her knee pain medication (see above). Advised that she should not take the tramadol for pain since it made her feel drowsy. She is in agreement with this plan, and had not been taking it. I told her that I would pass the message along to Dr. Earlene Plater regarding this, but that she could elevate, rest, and ice her knee to help relieve pain, or take ibuprofen.  Additional Follow-up by: Bobby Rumpf  MD,  February 03, 2010 6:22 PM     Appended Document: triage pt is needing to talk to nurse  Appended Document: triage states she never got the mobic. told her it was sent at same time as the tramadol. states she is taking ibuprofen. told her she cannot take both as the are in same class. must use one or the other & take with a meal. states she has ibu at home & will use that.   will not take tramadol again per pt. declined appt. I reinforced rest, elevation & ice off & on.

## 2010-09-21 NOTE — Letter (Signed)
Summary: Mammogram Result Letter  [facility]  [address]   [place], [place] [address]   Phone: [phone]  Fax: [phone]    04/07/2009  Hayley Fisher [address] [place], [place]  [address]  Dear Ms. Hemminger,  I am happy to inform you that the result of your breast ultrasound was normal. You have no findings worrisome for cancer. Please follow up with another mammogram in 1 year.  Sincerely,   Helane Rima DO   Appended Document: Mammogram Result Letter mailed

## 2010-09-21 NOTE — Progress Notes (Signed)
Summary: re: UTI and meds/TS   ---- Converted from flag ---- ---- 07/20/2010 3:48 PM, Helane Rima DO wrote: please call patient to let her know that she has a uti and i sent in keflex. ------------------------------  called pt. number busy.Arlyss Repress CMA,  July 20, 2010 5:10 PM called pt and informed of above. pt will pick up her abx from the pharmacy.Arlyss Repress CMA,  July 20, 2010 5:21 PM

## 2010-09-21 NOTE — Progress Notes (Signed)
Summary: triage   Phone Note Call from Patient Call back at Home Phone [phone]   Caller: Patient Summary of Call: Having a cloudy urine. Initial call taken by: Clydell Hakim,  February 11, 2010 2:50 PM  Follow-up for Phone Call        started 1 wk ago with cloudy urine. white urine. no pain. lots of bubbles. no burning. thinks she should get checked. no appts here now. she wanted to wait until tomorrow & see [place]. work in with her pcp told her to drink lots of water Follow-up by: Golden Circle RN,  February 11, 2010 2:53 PM

## 2010-09-21 NOTE — Progress Notes (Signed)
Summary: SPEAK TO RN   Phone Note Call from Patient Call back at Home Phone [phone]   Caller: Patient Summary of Call: RIGHT FOOT PAIN AND HAS DM WOULD LIKE TO SPEAK TO RN Initial call taken by: Dedra Skeens CMA,,  April 07, 2008 9:44 AM  Follow-up for Phone Call        has been told it is arthritis and wants to know if it is supposed to hurt. told her yes. she has not taken anything for the pain. advised tylenol every 4-5 hours. states they are slightly swollen at times. advised elevating when sitting & continue her exercise class 3 times a week as tolerated. she does not like taking meds. advised distraction techniques such as socializing, reading, tv. she did not want appt Follow-up by: Golden Circle RN,  April 07, 2008 10:20 AM

## 2010-09-21 NOTE — Assessment & Plan Note (Signed)
Summary: knee pain,df   Vital Signs:  Patient profile:   77 year old female Height:      64 inches Weight:      180 pounds BMI:     31.01 Temp:     98.0 degrees F oral Pulse rate:   80 / minute BP sitting:   109 / 68  (left arm) Cuff size:   large  Vitals Entered By: Tessie Fass CMA (February 01, 2010 1:32 PM) CC: right knee and leg pain Is Patient Diabetic? Yes Pain Assessment Patient in pain? yes     Location: right knee and leg Intensity: 9   Primary Care Provider:  Helane Rima DO  CC:  right knee and leg pain.  History of Present Illness: 77 y/o AAF:  1. Knee Pain: right knee, with radiation up thigh and down leg, worse with walking, using a stick, fell - was walking down steps and missed a step - fell forward and to the side, hit her right side/knee, no immediate pain or swelling, able to walk, no open lesions. Hx degenerative joint disease.  Habits & Providers  Alcohol-Tobacco-Diet     Tobacco Status: quit  Current Medications (verified): 1)  Bayer Childrens Aspirin 81 Mg Chew (Aspirin) .... Take 1 Tablet By Mouth Once A Day 2)  Enalapril Maleate 20 Mg Tabs (Enalapril Maleate) .... Take 1 Tablet By Mouth Once A Day 3)  Lantus 100 Unit/ml Soln (Insulin Glargine) .... Inject 30 Unit Subcutaneously Once A Day 4)  Zocor 80 Mg Tabs (Simvastatin) .... One Tab By Mouth Daily 5)  Senokot S 8.6-50 Mg  Tabs (Sennosides-Docusate Sodium) .... 2 Tabs By Mouth Two Times A Day 6)  Dulcolax 10 Mg  Supp (Bisacodyl) .Marland Kitchen.. 1 Suppository As Needed For Hard Stools or No Stools >3 Days. 7)  Miralax   Powd (Polyethylene Glycol 3350) .Marland Kitchen.. 17g By Mouth in 8 Ounces Fluid Once Daily 8)  Aimsco Insulin Syr Ultra Thin 31g X 5/16" 0.3 Ml  Misc (Insulin Syringe-Needle U-100) .... Use For Injections As Needed 9)  Mobic 7.5 Mg Tabs (Meloxicam) .Marland Kitchen.. 1 Tab By Mouth Daily For Knee Pain 10)  Tramadol Hcl 50 Mg  Tabs (Tramadol Hcl) .... One By Mouth Q 6 Hours As Needed Pain  Allergies  (verified): No Known Drug Allergies PMH-FH-SH reviewed for relevance  Social History: Smoking Status:  quit  Review of Systems General:  Denies chills and fever. MS:  Complains of joint pain and joint swelling; denies joint redness and loss of strength. Derm:  Denies lesion(s) and rash. Neuro:  Complains of falling down; denies numbness and tingling.  Physical Exam  General:  Well-developed,well-nourished,in no acute distress; alert,appropriate and cooperative throughout examination. Vitals reviewed.   Msk:  Right Knee: Normal to inspection with no erythema or obvious bony abnormalities.+ mild effusion. + medial joint line tenderness. no patellar or condyle tenderness. ROM normal in flexion and extension. Ligaments with solid consistent endpoints including ACL, PCL, LCL, MCL. Negative Mcmurray's and provocative meniscal tests. Patellar and quadriceps tendons unremarkable. Hamstring and quadriceps strength is normal. Patient noted to have multiple swollen joints - bilateral hands (PIPs, MCPs). Pulses:  R dorsalis pedis normal and L dorsalis pedis normal.   Extremities:  No edema. Neurologic:  Strength normal in all extremities and sensation intact to light touch.   Skin:  Intact without suspicious lesions or rashes. Psych:  Oriented X3, memory intact for recent and remote, normally interactive, good eye contact, and not anxious appearing.  Impression & Recommendations:  Problem # 1:  KNEE PAIN, RIGHT (ICD-719.46) Assessment New No red flags. Added Ultram to pain regimen. Rec ice to knee, fall precautions, follow up in 3-4 weeks if not improved.  Her updated medication list for this problem includes:    Bayer Childrens Aspirin 81 Mg Chew (Aspirin) .Marland Kitchen... Take 1 tablet by mouth once a day    Mobic 7.5 Mg Tabs (Meloxicam) .Marland Kitchen... 1 tab by mouth daily for knee pain    Tramadol Hcl 50 Mg Tabs (Tramadol hcl) ..... One by mouth q 6 hours as needed pain  Orders: [facility]- Est Level  3  (09811)  Problem # 2:  POLYARTHRITIS (ICD-719.49) Assessment: Unchanged Patient with multiple swollen joints and FamHx of arthritis - unknown type. Will evaluate for RA.  Orders: Comp Met-[facility] [id number]) CBC w/Diff-[facility] [id number]) Rheum Fact-[facility] [id number]) Sed Rate (ESR)-[facility] [id number]) Miscellaneous Lab Charge-[facility] [id number]) [facility]- Est Level  3 (24401)  Complete Medication List: 1)  Bayer Childrens Aspirin 81 Mg Chew (Aspirin) .... Take 1 tablet by mouth once a day 2)  Enalapril Maleate 20 Mg Tabs (Enalapril maleate) .... Take 1 tablet by mouth once a day 3)  Lantus 100 Unit/ml Soln (Insulin glargine) .... Inject 30 unit subcutaneously once a day 4)  Zocor 80 Mg Tabs (Simvastatin) .... One tab by mouth daily 5)  Senokot S 8.6-50 Mg Tabs (Sennosides-docusate sodium) .... 2 tabs by mouth two times a day 6)  Dulcolax 10 Mg Supp (Bisacodyl) .Marland Kitchen.. 1 suppository as needed for hard stools or no stools >3 days. 7)  Miralax Powd (Polyethylene glycol 3350) .Marland Kitchen.. 17g by mouth in 8 ounces fluid once daily 8)  Aimsco Insulin Syr Ultra Thin 31g X 5/16" 0.3 Ml Misc (Insulin syringe-needle u-100) .... Use for injections as needed 9)  Mobic 7.5 Mg Tabs (Meloxicam) .Marland Kitchen.. 1 tab by mouth daily for knee pain 10)  Tramadol Hcl 50 Mg Tabs (Tramadol hcl) .... One by mouth q 6 hours as needed pain  Other Orders: Diagnostic X-Ray/Fluoroscopy (Diagnostic X-Ray/Flu)  Patient Instructions: 1)  It was nice to see you today! 2)  Take the Mobic for pain control daily and the Ultram for breakthrough pain. Ice your knee for 10 minutes three times a day. 3)  Follow up in 1-2 weeks if not improving or if worsening. Prescriptions: TRAMADOL HCL 50 MG  TABS (TRAMADOL HCL) one by mouth q 6 hours as needed pain  #30 x 0   Entered and Authorized by:   Helane Rima DO   Signed by:   Helane Rima DO on 02/01/2010   Method used:   Electronically to        [facility] [phone]* (retail)       [address]       [place],  [place]  [address]       Ph: [phone]       Fax: [phone]   RxID:   [id number] MOBIC 7.5 MG TABS (MELOXICAM) 1 tab by mouth daily for knee pain  #90 x 0   Entered and Authorized by:   Helane Rima DO   Signed by:   Helane Rima DO on 02/01/2010   Method used:   Electronically to        [facility] [facility] [address] [id number] (retail)       [address] [facility]       [place], [place]  [address]  Ph: [phone]       Fax: [phone]   RxID:   [id number]   Laboratory Results   Blood Tests   Date/Time Received: February 01, 2010 1:57 PM  Date/Time Reported: February 01, 2010 3:17 PM   SED rate: 20 mm/hr  Comments: ...........test performed by...........Marland KitchenTerese Door, CMA

## 2010-11-24 ENCOUNTER — Other Ambulatory Visit: Payer: Self-pay | Admitting: Family Medicine

## 2010-11-24 DIAGNOSIS — E119 Type 2 diabetes mellitus without complications: Secondary | ICD-10-CM

## 2010-11-24 MED ORDER — INSULIN DETEMIR 100 UNIT/ML ~~LOC~~ SOLN: 15.0000 [IU] | Freq: Two times a day (BID) | SUBCUTANEOUS | Status: DC

## 2010-11-29 LAB — APTT: aPTT: 36 seconds (ref 24–37)

## 2010-11-29 LAB — BASIC METABOLIC PANEL
BUN: 14 mg/dL (ref 6–23)
CO2: 30 mEq/L (ref 19–32)
Calcium: 8.1 mg/dL — ABNORMAL LOW (ref 8.4–10.5)
Calcium: 8.6 mg/dL (ref 8.4–10.5)
Chloride: 105 mEq/L (ref 96–112)
Creatinine, Ser: 0.77 mg/dL (ref 0.4–1.2)
GFR calc Af Amer: >60 mL/min (ref >60)
GFR calc non Af Amer: >60 mL/min (ref >60)
Glucose, Bld: 162 mg/dL — ABNORMAL HIGH (ref 70–99)
Potassium: 3.8 mEq/L (ref 3.5–5.1)
Sodium: 139 mEq/L (ref 135–145)
Sodium: 137 mEq/L (ref 135–145)

## 2010-11-29 LAB — CBC
HCT: 33.7 % — ABNORMAL LOW (ref 36.0–46.0)
Hemoglobin: 11.5 g/dL — ABNORMAL LOW (ref 12.0–15.0)
Hemoglobin: 12.7 g/dL (ref 12.0–15.0)
MCHC: 34.1 g/dL (ref 30.0–36.0)
MCHC: 33.7 g/dL (ref 30.0–36.0)
MCV: 94.6 fL (ref 78.0–100.0)
Platelets: 200 10*3/uL (ref 150–400)
RBC: 3.56 MIL/uL — ABNORMAL LOW (ref 3.87–5.11)
RDW: 12.4 % (ref 11.5–15.5)

## 2010-11-29 LAB — GLUCOSE, CAPILLARY
Glucose-Capillary: 116 mg/dL — ABNORMAL HIGH (ref 70–99)
Glucose-Capillary: 125 mg/dL — ABNORMAL HIGH (ref 70–99)
Glucose-Capillary: 203 mg/dL — ABNORMAL HIGH (ref 70–99)

## 2010-11-29 LAB — PROTIME-INR
INR: 1 (ref 0.00–1.49)
Prothrombin Time: 13 seconds (ref 11.6–15.2)

## 2010-11-30 ENCOUNTER — Ambulatory Visit (INDEPENDENT_AMBULATORY_CARE_PROVIDER_SITE_OTHER): Payer: Medicare Other | Admitting: Family Medicine

## 2010-11-30 ENCOUNTER — Encounter: Payer: Self-pay | Admitting: Family Medicine

## 2010-11-30 VITALS — BP 125/79 | HR 87 | Temp 98.3°F | Wt 180.0 lb

## 2010-11-30 DIAGNOSIS — E119 Type 2 diabetes mellitus without complications: Secondary | ICD-10-CM

## 2010-11-30 LAB — BASIC METABOLIC PANEL
Calcium: 8.9 mg/dL (ref 8.4–10.5)
GFR calc Af Amer: >60 mL/min (ref >60)
GFR calc non Af Amer: >60 mL/min (ref >60)
Potassium: 5 mEq/L (ref 3.5–5.1)
Sodium: 139 mEq/L (ref 135–145)

## 2010-11-30 LAB — CBC
HCT: 35.8 % — ABNORMAL LOW (ref 36.0–46.0)
Hemoglobin: 11.5 g/dL — ABNORMAL LOW (ref 12.0–15.0)
RDW: 13.7 % (ref 11.5–15.5)
WBC: 2.8 10*3/uL — ABNORMAL LOW (ref 4.0–10.5)

## 2010-11-30 LAB — TYPE AND SCREEN: ABO/RH(D): O POS

## 2010-11-30 LAB — PROTIME-INR: INR: 1.1 (ref 0.00–1.49)

## 2010-11-30 LAB — APTT: aPTT: 30 seconds (ref 24–37)

## 2010-11-30 LAB — ABO/RH: ABO/RH(D): O POS

## 2010-12-08 ENCOUNTER — Encounter: Payer: Self-pay | Admitting: Family Medicine

## 2010-12-08 MED ORDER — INSULIN DETEMIR 100 UNIT/ML ~~LOC~~ SOLN: 10.0000 [IU] | Freq: Two times a day (BID) | SUBCUTANEOUS | Status: DC

## 2010-12-08 NOTE — Assessment & Plan Note (Signed)
Adjusted regimen to 10 units Levemir SQ BID. Patient to record BG and follow up in 3-4 weeks.

## 2010-12-08 NOTE — Progress Notes (Signed)
  Subjective:    Patient ID: Antwan Bribiesca, female    DOB: [birth year], 77 y.o.   MRN: [id number]  HPI  1. DM: Patient recently switched from Lantus to Levemir 2/2 insurance issues. Was well-controlled on Lantus. Since the switch, she has had a few low BG (in 60s and 70s). No CP, SOB, N/V/D, HA, dizziness. Upon detailed questioning, we found out that she was previously taking 20 units daily of Lantus instead of the documented 30 units Lantus daily.  Review of Systems SEE HPI.    Objective:   Physical Exam  Constitutional: She appears well-developed and well-nourished. No distress.  Cardiovascular: Normal rate, regular rhythm, normal heart sounds and intact distal pulses.   Pulmonary/Chest: Effort normal and breath sounds normal.  Abdominal: Soft. Bowel sounds are normal.      Assessment & Plan:

## 2011-01-04 NOTE — Op Note (Signed)
Hayley Fisher, Hayley Fisher              ACCOUNT NO.:  [id number]   MEDICAL RECORD NO.:  [id number]          PATIENT TYPE:  OIB   LOCATION:  1425                         FACILITY:  [facility]   PHYSICIAN:  Martina Sinner, MD DATE OF BIRTH:  Jul 02, 1931   DATE OF PROCEDURE:  02/03/2009  DATE OF DISCHARGE:                               OPERATIVE REPORT   PREOPERATIVE DIAGNOSIS:  Cystocele.   POSTOPERATIVE DIAGNOSIS:  Cystocele repaired plus cystoscopy.   ASSISTANT:  Delia Chimes, NP   SURGEON:  Martina Sinner, MD   HISTORY:  Hayley Fisher has a large grade 2 and one could argue a very  small grade 3 cystocele.  She has a slow flow with a hinge effect.   PROCEDURE IN DETAIL:  The patient was prepped and draped in usual  fashion.  Preoperative antibiotics were given.  Preoperative laboratory  tests were normal.  Leg positioning, extra care was taken to minimize  the risks of compartment syndrome neuropathy and DVT.   Under anesthesia, the vaginal cuff was actually quite well supported.  Only descended approximately 2 cm.  I could easily identify the vaginal  cuff with dimples.  I placed two 3-0 Vicryl sutures in this area.  There  is no question that her bladder neck at the urethrovesical angle was  markedly over corrected.  This could be the cause of her slow-flow.  It  actually made doing the anterior vaginal wall incision up into the level  of the proximal urethra difficult and I had used Allis clamps and placed  the bed very high in lithotomy position.   Between Allis clamps, I made a T-shaped anterior vaginal wall incision  and dissected to the white-line bilaterally.  She had a very short  anterior vaginal wall.  It was obvious that I had not gone back far  enough relative to the first dimples and went back approximately 2 more  centimeters.  I used a lot of sharp dissection and was careful to free  up the bladder apically and laterally.  I really felt if I went much  further, I would start rotating posteriorly.  In spite of this, she  really did not have a lot of length.  I was happy that I went to the  white-line bilaterally.  I did do not the urethrolysis of her over  correction of the urethrovesical angle.   She had a very narrow pelvis.  Her anatomy was very odd and she actually  had good lateral attachments and she was very well- attached near the  urethrovesical angle.  She had some apical weakness, but I was very  concerned about trying to do vault suspension.  I did not like how  narrow her pelvis was and I thought it was very much overkill for her  defect. I did not want to cause a lot of bleeding and again I did not  think there was enough defect to justify the sacrospinous fixation.   I did a two-layer closure.  I used 2-0 Vicryl.  It flattened her small  central defect.  I cystoscoped the patient  and gave methylene blue twice  and there is no question there was excellent blue jets bilaterally.  Cystoscopically, there was reasonable reduction of her cystocele.   I trimmed a few millimeters up to a centimeter of vaginal wall mucosa  bilaterally and closed the anterior vaginal wall with running 2-0 Vicryl  on a CT-1 needle.   At the end of the case, I still felt that there was somewhat of a hinge  especially near the apex.  Obviously, she was still over corrected at  the urethrovesical angle.  I did not feel like there was room to place a  graft and it would be of any value.   Vaginal pack with Estrace cream was inserted.  Urine output was good.  Leg position was good.   I think the only way to help the patient in future if she becomes  symptomatic again would be to do more of a limited apical repair  utilizing a robotic sacrocolpopexy.           ______________________________  Martina Sinner, MD  Electronically Signed     SAM/MEDQ  D:  02/03/2009  T:  02/03/2009  Job:  [id number]

## 2011-01-04 NOTE — Assessment & Plan Note (Signed)
[facility]                         GASTROENTEROLOGY OFFICE NOTE   [name], [name]                       MRN:          [id number]  DATE:02/12/2007                            DOB:          03/04/1931    REASON FOR CONSULTATION:  Family history of colorectal cancer.   Hayley Fisher is a pleasant 77 year old African-American female referred  through the courtesy of  Dr. Brunilda Payor for evaluation.   FAMILY HISTORY:  Pertinent for mother who developed colon cancer in her  37s.   Mrs. Kilburg last underwent colonoscopy in 2002.  Exam was entirely  normal.  She complains of mild chronic constipation with small scybalous  stools.  There is no history of melena or frank hematochezia.   PAST MEDICAL HISTORY:  Pertinent for:  1. Hypertension.  2. Diabetes.  3. Status post herniorrhaphy.  4. Status post hysterectomy.  5. She complains of an irregular urinary stream.   MEDICATIONS:  Include insulin, enalapril, a cholesterol medicine, and  baby aspirin.   ALLERGIES:  None.   SOCIAL HISTORY:  She neither smokes or drinks.  She is married and  retired.   REVIEW OF SYSTEMS:  Positive for polyuria.   PHYSICAL EXAMINATION:  VITAL SIGNS: Pulse 80, blood pressure 98/66.  Weight 183.  HEENT:  Extraocular movements intact.  Pupils equal, round, and reactive  to light and accommodation.  Sclerae are anicteric.  Conjunctivae are  pink.  NECK:  Supple without thyromegaly, adenopathy, or carotid bruits.  CHEST:  Clear to ausculation and percussion without adventitious sounds.  CARDIAC:  Regular rhythm; normal S1, S2.  There are no murmurs, gallops,  or rubs.  ABDOMEN:  Bowel sounds are normoactive.  Abdomen is soft,  nontender.  There are no abdominal masses, tenderness, splenic  enlargement, or hepatomegaly.  EXTREMITIES:  Full range of motion.  No  cyanosis, clubbing, or edema.  RECTAL:  Deferred.   IMPRESSION:  Family history of colorectal cancer.   RECOMMENDATIONS:  Colonoscopy.     Barbette Hair. Arlyce Dice, MD,FACG  Electronically Signed    RDK/MedQ  DD: 02/12/2007  DT: 02/12/2007  Job #: [id number]   cc:   Lindaann Slough, M.D.  Angeline Slim, M.D.

## 2011-03-17 ENCOUNTER — Ambulatory Visit (INDEPENDENT_AMBULATORY_CARE_PROVIDER_SITE_OTHER): Payer: Medicare Other | Admitting: Family Medicine

## 2011-03-17 ENCOUNTER — Encounter: Payer: Self-pay | Admitting: Family Medicine

## 2011-03-17 DIAGNOSIS — R82998 Other abnormal findings in urine: Secondary | ICD-10-CM

## 2011-03-17 DIAGNOSIS — L299 Pruritus, unspecified: Secondary | ICD-10-CM | POA: Insufficient documentation

## 2011-03-17 DIAGNOSIS — E119 Type 2 diabetes mellitus without complications: Secondary | ICD-10-CM

## 2011-03-17 DIAGNOSIS — R8290 Unspecified abnormal findings in urine: Secondary | ICD-10-CM | POA: Insufficient documentation

## 2011-03-17 MED ORDER — INSULIN GLARGINE 100 UNIT/ML ~~LOC~~ SOLN: 20.0000 [IU] | Freq: Every day | SUBCUTANEOUS | Status: DC

## 2011-03-17 NOTE — Assessment & Plan Note (Signed)
Absent any signs or symptoms of a urinary tract INFECTION. She likely has asymptomatic bacteriuria. We discussed the risks vs benefits of treating asymptomatic bacteriuria and she would decline treatment. As she would decline treatment we will not do confirmatory testing with UA. Will test and treat if she becomes symptomatic.

## 2011-03-17 NOTE — Progress Notes (Signed)
Generalized puritis x 1 week. Had a similar episode a few months ago that resolved spontaneously. The itching started for the first time following starting levemir. She denies any rash, fever or chills. No other new medications, soap, exposures, or clothing.   Also notes that her urine is cloudy and funny smelling. No dysuria however. Denies any urgency, pain, frequency fever or chills. No blood in her urine.   Exam:  BP 124/68  Pulse 82  Temp(Src) 97.7 F (36.5 C) (Oral)  Wt 170 lb 3.2 oz (77.202 kg) Gen: Well NAD HEENT: EOMI,  MMM Lungs: CTABL Nl WOB Heart: RRR no MRG Abd: NABS, NT, ND Exts: Non edematous BL  LE, warm and well perfused.  Skin: Some excoriated patches. No rash noted.

## 2011-03-17 NOTE — Patient Instructions (Signed)
Thank you for coming in today. I filled out a prior authorization form for Lantus. It will take a while.  Continue levemir.  Get some GOLD BOND ITCH and use a needed.  If the itching is really bad you can use some benadryl 12.5 - 25 mg at night as needed.  If your urine gets worse or you have fevers, chills,  Pain, feeling like you have to pee all the time or blood in your urine let me know and we will start treatment.  See Dr. Ashley Royalty in a few weeks.

## 2011-03-17 NOTE — Assessment & Plan Note (Signed)
Think may be due to levemir as pruritis is a common reaction. Skin exam is completely benign and she does not have any red flags for dangerous rash.  Plan: Switch to lantus 20 units qhs. Will fill out prior authorization form . Will also use gold bond itch and benadryl prn.  Will f/u with PCP in a few weeks.

## 2011-03-18 ENCOUNTER — Telehealth: Payer: Self-pay | Admitting: *Deleted

## 2011-03-18 NOTE — Telephone Encounter (Signed)
Approval received from medicare for lantus Insulin. Pharmacy notified.

## 2011-03-24 ENCOUNTER — Ambulatory Visit (INDEPENDENT_AMBULATORY_CARE_PROVIDER_SITE_OTHER): Payer: Medicare Other | Admitting: Ophthalmology

## 2011-03-24 DIAGNOSIS — E11319 Type 2 diabetes mellitus with unspecified diabetic retinopathy without macular edema: Secondary | ICD-10-CM

## 2011-03-24 DIAGNOSIS — H43819 Vitreous degeneration, unspecified eye: Secondary | ICD-10-CM

## 2011-03-24 DIAGNOSIS — H3581 Retinal edema: Secondary | ICD-10-CM

## 2011-06-01 LAB — CBC
Platelets: 254
RBC: 3.96
WBC: 3.7 — ABNORMAL LOW

## 2011-06-01 LAB — URINE MICROSCOPIC-ADD ON

## 2011-06-01 LAB — I-STAT 8, (EC8 V) (CONVERTED LAB)
Acid-Base Excess: 2
BUN: 9
Chloride: 104
HCT: 41
Hemoglobin: 13.9
Operator id: 234501
Potassium: 4.1
pCO2, Ven: 46.7

## 2011-06-01 LAB — DIFFERENTIAL
Lymphocytes Relative: 25
Lymphs Abs: 0.9
Monocytes Relative: 8
Neutro Abs: 2.4
Neutrophils Relative %: 65

## 2011-06-01 LAB — OCCULT BLOOD X 1 CARD TO LAB, STOOL: Fecal Occult Bld: NEGATIVE

## 2011-06-01 LAB — URINALYSIS, ROUTINE W REFLEX MICROSCOPIC
Bilirubin Urine: NEGATIVE
Glucose, UA: 500 — AB
Hgb urine dipstick: NEGATIVE
Protein, ur: NEGATIVE
Specific Gravity, Urine: 1.013
Urobilinogen, UA: 1

## 2011-06-01 LAB — HEPATIC FUNCTION PANEL
ALT: 22
AST: 26
Albumin: 3.8
Alkaline Phosphatase: 56
Total Bilirubin: 0.9
Total Protein: 6.8

## 2011-06-01 LAB — LIPASE, BLOOD: Lipase: 14

## 2011-06-29 ENCOUNTER — Other Ambulatory Visit: Payer: Self-pay | Admitting: Family Medicine

## 2011-06-29 DIAGNOSIS — E119 Type 2 diabetes mellitus without complications: Secondary | ICD-10-CM

## 2011-06-29 MED ORDER — INSULIN GLARGINE 100 UNIT/ML ~~LOC~~ SOLN: 20.0000 [IU] | Freq: Every day | SUBCUTANEOUS | Status: DC

## 2011-06-29 MED ORDER — "INSULIN SYRINGE-NEEDLE U-100 31G X 5/16"" 0.3 ML MISC": Status: DC

## 2011-07-01 ENCOUNTER — Ambulatory Visit: Payer: Medicare Other | Admitting: Family Medicine

## 2011-07-05 ENCOUNTER — Ambulatory Visit (INDEPENDENT_AMBULATORY_CARE_PROVIDER_SITE_OTHER): Payer: Medicare Other | Admitting: Family Medicine

## 2011-07-05 VITALS — BP 128/77 | HR 88 | Ht 65.0 in | Wt 176.0 lb

## 2011-07-05 DIAGNOSIS — L738 Other specified follicular disorders: Secondary | ICD-10-CM

## 2011-07-05 DIAGNOSIS — L678 Other hair color and hair shaft abnormalities: Secondary | ICD-10-CM

## 2011-07-05 DIAGNOSIS — L731 Pseudofolliculitis barbae: Secondary | ICD-10-CM

## 2011-07-05 DIAGNOSIS — E119 Type 2 diabetes mellitus without complications: Secondary | ICD-10-CM

## 2011-07-05 DIAGNOSIS — E785 Hyperlipidemia, unspecified: Secondary | ICD-10-CM

## 2011-07-05 DIAGNOSIS — I10 Essential (primary) hypertension: Secondary | ICD-10-CM

## 2011-07-05 LAB — CBC
HCT: 36.5 % (ref 36.0–46.0)
Hemoglobin: 11.8 g/dL — ABNORMAL LOW (ref 12.0–15.0)
MCHC: 32.3 g/dL (ref 30.0–36.0)
MCV: 93.1 fL (ref 78.0–100.0)
RDW: 13.4 % (ref 11.5–15.5)

## 2011-07-05 LAB — COMPREHENSIVE METABOLIC PANEL
ALT: 20 U/L (ref 0–35)
AST: 21 U/L (ref 0–37)
Alkaline Phosphatase: 67 U/L (ref 39–117)
BUN: 11 mg/dL (ref 6–23)
Creat: 0.87 mg/dL (ref 0.50–1.10)
Total Bilirubin: 0.6 mg/dL (ref 0.3–1.2)

## 2011-07-05 LAB — POCT GLYCOSYLATED HEMOGLOBIN (HGB A1C): Hemoglobin A1C: 9.1

## 2011-07-05 MED ORDER — "INSULIN SYRINGE-NEEDLE U-100 31G X 5/16"" 0.3 ML MISC": Status: DC

## 2011-07-05 NOTE — Assessment & Plan Note (Signed)
Well controlled, currently at goal.  Continue enalapril

## 2011-07-05 NOTE — Assessment & Plan Note (Signed)
A1c on 9.1 indicates worsening control of her diabetes.  Given that she reports low sugars in the morning i am hesitant to adjust her lantus at this point.  I think her post-prandial sugars may be running high and she may need meal time coverage.  I will have her keep a record of her am glucose as well as check post prandial glucose for the next two weeks.  She will then follow up and we will see where we need to make adjustments.   -Diabetic foot exam performed and  Recorded under diabetic foot exam portion of EMR

## 2011-07-05 NOTE — Patient Instructions (Signed)
It was nice meeting you today.  I would like for you to keep a log of your sugars for the next two weeks.  Check your sugar in the morning and also check it 1-2 hours after eating.  Come back to see me in two weeks so we can review and see where your sugars are running high.

## 2011-07-05 NOTE — Assessment & Plan Note (Signed)
Bump on neck consistent with ingrown hair, no signs of infection at this point.  Advised will likely resolve on its own, given red flags

## 2011-07-05 NOTE — Progress Notes (Signed)
  Subjective:    Patient ID: Hayley Fisher, female    DOB: 1931/06/27, 77 y.o.   MRN: [id number]  HPI 1.Diabetes: Currently only on Lantus 20 units daily.  She states she has been compliant with taking her insulin and checking her sugars.  She says that her sugar was 160 this am and she averages in the 90's to low 100s most mornings.  She gets little exercise but her diet is consistently high in vegetables. She says she has had episodes of low blood sugars (50-70) but does not occur often and only occurs if she does not eat full meals.  She feels jittery when her sugar is low.  No increased thirst or urination.  2. HTN:  BP has been well controlled on current medication.  She denies any chest pain, nausea, vomiting, headache, weakness.   3. HLD:  Has not been taking simvastatin.  Feels like she does not need medication for cholesterol.  No side effects from medication.  Tries to eat low fat diet high in vegetables and with less meat.   4. Bump on neck:  Has had bump on back side of neck for a couple days.  Recently had haircut and thinks this is a "hair bump."  Area is not painful but is itchy at times.  No fever, chills or change in size.   Review of Systems Per HPI    Objective:   Physical Exam  Constitutional: She appears well-developed and well-nourished. She appears distressed.  HENT:  Head: Normocephalic and atraumatic.  Eyes: Pupils are equal, round, and reactive to light.  Neck: Neck supple. No thyromegaly present.  Cardiovascular: Normal rate, regular rhythm and normal heart sounds.  Exam reveals no gallop and no friction rub.   No murmur heard. Pulmonary/Chest: Effort normal and breath sounds normal. No respiratory distress.  Musculoskeletal: She exhibits no edema.  Skin:       Small bump on back of neck, no fluctuation, redness or tenderness.            Assessment & Plan:

## 2011-07-05 NOTE — Assessment & Plan Note (Signed)
Currently not on her zocor.  Will check direct ldl today to assess need for continued statin.

## 2011-07-22 ENCOUNTER — Encounter: Payer: Self-pay | Admitting: Family Medicine

## 2011-07-22 ENCOUNTER — Ambulatory Visit (INDEPENDENT_AMBULATORY_CARE_PROVIDER_SITE_OTHER): Payer: Medicare Other | Admitting: Family Medicine

## 2011-07-22 VITALS — BP 117/68 | HR 82 | Ht 65.0 in | Wt 178.1 lb

## 2011-07-22 DIAGNOSIS — E119 Type 2 diabetes mellitus without complications: Secondary | ICD-10-CM

## 2011-07-22 DIAGNOSIS — Z23 Encounter for immunization: Secondary | ICD-10-CM

## 2011-07-22 LAB — POCT GLYCOSYLATED HEMOGLOBIN (HGB A1C): Hemoglobin A1C: 8

## 2011-07-22 NOTE — Patient Instructions (Signed)
It was nice seeing you today I would like for you to make an appointment with Dr. Raymondo Band in our pharmacy clinic.  I think you should be started on meal time insulin coverage to help control your sugars. Please bring your booklet with your recorded sugars. For your unsteadiness I would recommend trying a cane to see if this help stabilize you, I would use a four-prong cane for added stability.

## 2011-07-26 NOTE — Assessment & Plan Note (Signed)
It appears that her sugars are running high after meals which is causing her A1c to be elevated.  I think she needs meal time insulin but it is hard to be sure without seeing her log.  I think she could manage a meal time sliding scale.  I will refer her to pharmacy clinic and have her be sure to bring her log in to get started on meal time insulin.

## 2011-07-26 NOTE — Progress Notes (Signed)
  Subjective:    Patient ID: Hayley Fisher, female    DOB: 1931/03/17, 77 y.o.   MRN: [id number]  HPI 1.  Diabetes:  Here to f/u on diabetes.  At last appointment she was asked to keep a log of her post prandial sugars.  She states she has been keeping a log but she left it at home.  From memory she does recall that her post lunch sugars have been running around 300.  Reports occasional lows (50-70) if she does not eat a full meal.  She is compliant  With her lantus.    Review of Systems     Objective:   Physical Exam  Constitutional: She appears well-developed and well-nourished.  Cardiovascular: Normal rate, regular rhythm and normal heart sounds.   Pulmonary/Chest: Effort normal and breath sounds normal.  Musculoskeletal: She exhibits no edema.          Assessment & Plan:

## 2011-08-12 ENCOUNTER — Ambulatory Visit (INDEPENDENT_AMBULATORY_CARE_PROVIDER_SITE_OTHER): Payer: Medicare Other | Admitting: Pharmacist

## 2011-08-12 ENCOUNTER — Encounter: Payer: Self-pay | Admitting: Pharmacist

## 2011-08-12 DIAGNOSIS — E119 Type 2 diabetes mellitus without complications: Secondary | ICD-10-CM

## 2011-08-12 NOTE — Patient Instructions (Addendum)
Thank you for coming into pharmacy clinic today.  We would like to see your morning fasting blood sugars around 100 (90-120). We would like to avoid the symptoms of lows you are having in the morning when you wake up and we would rather see that you have higher blood sugars.   Please continue to take your Lantus but DECREASE the dose to 12 (twelve) units in the morning.  Please continue to check your blood sugar first thing in the morning. Please continue to check your blood sugar before you eat or 2 hours after you eat. Please bring these numbers with you to your next visit.  Please return to pharmacy clinic in one month (late January or early February) to follow up with your progress.

## 2011-08-12 NOTE — Progress Notes (Signed)
  Subjective:    Patient ID: Hayley Fisher, female    DOB: December 06, 1930, 77 y.o.   MRN: [id number]  HPI Pt presents to pharmacy clinic in good spirits for diabetes management with her blood sugar log as requested.  Review of this log book shows multiple AM fasting readings of < 80.  Patient states has sxs of nervousness, shakiness at 5:00 AM every morning this week.   Range in readings include 55-391  (majority of readings < 200)  She notes that she has been trying to eat less.   Following instruction on Novolog Flexpen administration, patient decided NOT to make suggested change.  She refused to start a new insulin; resistant to adding more injections to her regimen. She refused even one more shot daily.     Review of Systems     Objective:   Physical Exam  Blood pressure 170/84, pulse 98, height 5\' 3"  (1.6 m), weight 177 lb (80.287 kg).       Assessment & Plan:   Diabetes of 15 rs duration with insulin used for ~ 10 years currently under poor control of blood glucose based on  . Lab Results  Component Value Date   HGBA1C 8.0 07/22/2011     ,home fasting CBG readings of <80 and random CBG readings of 100-320.  Control is suboptimal due to suboptimal insulin regimen. Reports hypoglycemic events most mornings this week. Able to verbalize appropriate hypoglycemia management plan. Decreased dose of basal insulin Lantus (insulin glargine) from 20 units QAM to 12 units QAM. Patient will work on weight loss plan by eating less and continue on LANTUS only.   Written patient instructions provided.  Follow up in  Pharmacist Clinic Visit in late January.   Total time in face to face counseling 50 minutes.  Patient seen with  Maudry Mayhew, Pharmacy Resident.

## 2011-08-12 NOTE — Progress Notes (Signed)
  Subjective:    Patient ID: Hayley Fisher, female    DOB: [birth year], 77 y.o.   MRN: [id number]  HPI  Reviewed and agree with Dr. Macky Lower management     Review of Systems     Objective:   Physical Exam        Assessment & Plan:

## 2011-08-12 NOTE — Assessment & Plan Note (Signed)
  Diabetes of 15 rs duration with insulin used for ~ 10 years currently under poor control of blood glucose based on  . Lab Results  Component Value Date   HGBA1C 8.0 07/22/2011     ,home fasting CBG readings of <80 and random CBG readings of 100-320.  Control is suboptimal due to suboptimal insulin regimen. Reports hypoglycemic events most mornings this week. Able to verbalize appropriate hypoglycemia management plan. Decreased dose of basal insulin Lantus (insulin glargine) from 20 units QAM to 12 units QAM. Patient will work on weight loss plan by eating less and continue on LANTUS only.   Written patient instructions provided.  Follow up in  Pharmacist Clinic Visit in late January.   Total time in face to face counseling 50 minutes.  Patient seen with  Maudry Mayhew, Pharmacy Resident.

## 2011-09-08 ENCOUNTER — Other Ambulatory Visit: Payer: Self-pay | Admitting: Family Medicine

## 2011-09-08 DIAGNOSIS — Z1231 Encounter for screening mammogram for malignant neoplasm of breast: Secondary | ICD-10-CM

## 2011-09-18 ENCOUNTER — Emergency Department (HOSPITAL_COMMUNITY): Payer: Medicare Other

## 2011-09-18 ENCOUNTER — Emergency Department (HOSPITAL_COMMUNITY)
Admission: EM | Admit: 2011-09-18 | Discharge: 2011-09-18 | Disposition: A | Discharge status: Home / Self Care | Payer: Medicare Other | Attending: Emergency Medicine | Admitting: Emergency Medicine

## 2011-09-18 ENCOUNTER — Other Ambulatory Visit: Payer: Self-pay

## 2011-09-18 ENCOUNTER — Encounter (HOSPITAL_COMMUNITY): Payer: Self-pay | Admitting: Emergency Medicine

## 2011-09-18 DIAGNOSIS — Z7982 Long term (current) use of aspirin: Secondary | ICD-10-CM | POA: Diagnosis not present

## 2011-09-18 DIAGNOSIS — Z79899 Other long term (current) drug therapy: Secondary | ICD-10-CM | POA: Insufficient documentation

## 2011-09-18 DIAGNOSIS — R42 Dizziness and giddiness: Secondary | ICD-10-CM | POA: Diagnosis not present

## 2011-09-18 DIAGNOSIS — I10 Essential (primary) hypertension: Secondary | ICD-10-CM | POA: Diagnosis not present

## 2011-09-18 DIAGNOSIS — R110 Nausea: Secondary | ICD-10-CM | POA: Diagnosis not present

## 2011-09-18 DIAGNOSIS — Z794 Long term (current) use of insulin: Secondary | ICD-10-CM | POA: Insufficient documentation

## 2011-09-18 DIAGNOSIS — E119 Type 2 diabetes mellitus without complications: Secondary | ICD-10-CM | POA: Insufficient documentation

## 2011-09-18 DIAGNOSIS — E785 Hyperlipidemia, unspecified: Secondary | ICD-10-CM | POA: Insufficient documentation

## 2011-09-18 DIAGNOSIS — R5381 Other malaise: Secondary | ICD-10-CM | POA: Diagnosis not present

## 2011-09-18 DIAGNOSIS — R739 Hyperglycemia, unspecified: Secondary | ICD-10-CM

## 2011-09-18 DIAGNOSIS — M129 Arthropathy, unspecified: Secondary | ICD-10-CM | POA: Diagnosis not present

## 2011-09-18 DIAGNOSIS — J9819 Other pulmonary collapse: Secondary | ICD-10-CM | POA: Diagnosis not present

## 2011-09-18 DIAGNOSIS — G319 Degenerative disease of nervous system, unspecified: Secondary | ICD-10-CM | POA: Diagnosis not present

## 2011-09-18 DIAGNOSIS — I6789 Other cerebrovascular disease: Secondary | ICD-10-CM | POA: Diagnosis not present

## 2011-09-18 DIAGNOSIS — R5383 Other fatigue: Secondary | ICD-10-CM | POA: Diagnosis not present

## 2011-09-18 LAB — URINALYSIS, ROUTINE W REFLEX MICROSCOPIC
Bilirubin Urine: NEGATIVE
Glucose, UA: 250 mg/dL — AB
Hgb urine dipstick: NEGATIVE
Protein, ur: NEGATIVE mg/dL
Specific Gravity, Urine: 1.013 (ref 1.005–1.030)
Urobilinogen, UA: 0.2 mg/dL (ref 0.0–1.0)

## 2011-09-18 LAB — CBC
HCT: 36.4 % (ref 36.0–46.0)
Hemoglobin: 12.5 g/dL (ref 12.0–15.0)
MCHC: 34.3 g/dL (ref 30.0–36.0)
MCV: 91 fL (ref 78.0–100.0)
RDW: 12.3 % (ref 11.5–15.5)

## 2011-09-18 LAB — POCT I-STAT, CHEM 8
Chloride: 102 mEq/L (ref 96–112)
Glucose, Bld: 234 mg/dL — ABNORMAL HIGH (ref 70–99)
HCT: 39 % (ref 36.0–46.0)
Hemoglobin: 13.3 g/dL (ref 12.0–15.0)
Potassium: 3.9 mEq/L (ref 3.5–5.1)
Sodium: 138 mEq/L (ref 135–145)

## 2011-09-18 LAB — GLUCOSE, CAPILLARY: Glucose-Capillary: 195 mg/dL — ABNORMAL HIGH (ref 70–99)

## 2011-09-18 LAB — POCT I-STAT TROPONIN I

## 2011-09-18 MED ORDER — SODIUM CHLORIDE 0.9 % IV BOLUS (SEPSIS)
500.0000 mL | INTRAVENOUS | Status: AC
  Administered 2011-09-18: 500 mL via INTRAVENOUS

## 2011-09-18 NOTE — ED Notes (Signed)
Received pt from home with c/o dizziness onset this morning around 0644. Per EMS, 1st responders noted pt to be lethargic. Dizziness resolved during transport. Pt CBG 200 for EMS. Pt AAOx3.

## 2011-09-18 NOTE — ED Provider Notes (Signed)
2:17 PM Patient has been asymptomatic while in the ED and CDU. No significant labs to indicate why she felt dizzy this morning. Patient feels she's been trying to control her blood sugar at home and feels maybe it was too low at the time. Patient has close followup with family practice medicine scheduled for Tuesday. Advised patient should female has returned for worsening symptoms. Patient agrees with plan and is ready for discharge.   Results for orders placed during the hospital encounter of 09/18/11  CBC      Component Value Range   WBC 3.7 (*) 4.0 - 10.5 (K/uL)   RBC 4.00  3.87 - 5.11 (MIL/uL)   Hemoglobin 12.5  12.0 - 15.0 (g/dL)   HCT 16.1  09.6 - 04.5 (%)   MCV 91.0  78.0 - 100.0 (fL)   MCH 31.3  26.0 - 34.0 (pg)   MCHC 34.3  30.0 - 36.0 (g/dL)   RDW 40.9  81.1 - 91.4 (%)   Platelets 167  150 - 400 (K/uL)  URINALYSIS, ROUTINE W REFLEX MICROSCOPIC      Component Value Range   Color, Urine YELLOW  YELLOW    APPearance CLEAR  CLEAR    Specific Gravity, Urine 1.013  1.005 - 1.030    pH 5.0  5.0 - 8.0    Glucose, UA 250 (*) NEGATIVE (mg/dL)   Hgb urine dipstick NEGATIVE  NEGATIVE    Bilirubin Urine NEGATIVE  NEGATIVE    Ketones, ur 15 (*) NEGATIVE (mg/dL)   Protein, ur NEGATIVE  NEGATIVE (mg/dL)   Urobilinogen, UA 0.2  0.0 - 1.0 (mg/dL)   Nitrite NEGATIVE  NEGATIVE    Leukocytes, UA NEGATIVE  NEGATIVE   GLUCOSE, CAPILLARY      Component Value Range   Glucose-Capillary 195 (*) 70 - 99 (mg/dL)   Comment 1 Documented in Chart     Comment 2 Notify RN    POCT I-STAT, CHEM 8      Component Value Range   Sodium 138  135 - 145 (mEq/L)   Potassium 3.9  3.5 - 5.1 (mEq/L)   Chloride 102  96 - 112 (mEq/L)   BUN 17  6 - 23 (mg/dL)   Creatinine, Ser 7.82  0.50 - 1.10 (mg/dL)   Glucose, Bld 956 (*) 70 - 99 (mg/dL)   Calcium, Ion 2.13  0.86 - 1.32 (mmol/L)   TCO2 27  0 - 100 (mmol/L)   Hemoglobin 13.3  12.0 - 15.0 (g/dL)   HCT 57.8  46.9 - 62.9 (%)  POCT I-STAT TROPONIN I   Component Value Range   Troponin i, poc 0.00  0.00 - 0.08 (ng/mL)   Comment 3            Dg Chest 2 View  09/18/2011  *RADIOLOGY REPORT*  Clinical Data: Dizziness, weakness  CHEST - 2 VIEW  Comparison: Chest radiograph 10/06/2009  Findings: Normal mediastinum and heart silhouette ectatic aorta. There is trace atelectasis at the left lung base unchanged from prior.  No pulmonary edema.  No new infiltrate. Degenerative osteophytosis of the thoracic spine.  IMPRESSION: No acute cardiopulmonary process.  Original Report Authenticated By: Genevive Bi, M.D.   Ct Head Wo Contrast  09/18/2011  *RADIOLOGY REPORT*  Clinical Data: Dizziness  CT HEAD WITHOUT CONTRAST  Technique:  Contiguous axial images were obtained from the base of the skull through the vertex without contrast.  Comparison: The sinus CT 02/24/2004  Findings: No acute intracranial hemorrhage.  No focal mass lesion.  No CT evidence of acute infarction.   No midline shift or mass effect.  No hydrocephalus.  Basilar cisterns are patent.  Mild generalized cortical atrophy.  Very mild periventricular white matter hypodensities.  There is a permeative pattern of the clivus and sphenoid bone which is unchanged.   No fluid the paranasal sinuses  IMPRESSION:  1.  No acute intracranial findings. 2.  Mild atrophy.  Original Report Authenticated By: Genevive Bi, M.D.      Thomasene Lot, PA-C 09/18/11 1419

## 2011-09-18 NOTE — ED Provider Notes (Signed)
Medical screening examination/treatment/procedure(s) were conducted as a shared visit with non-physician practitioner(s) and myself.  I personally evaluated the patient during the encounter   Terius Jacuinde A. Patrica Duel, MD 09/18/11 [phone]

## 2011-09-18 NOTE — ED Provider Notes (Signed)
History     CSN: [id number]  Arrival date & time 09/18/11  0740   First MD Initiated Contact with Patient 09/18/11 [phone]      Chief Complaint  Patient presents with  . Dizziness  . Hyperglycemia   shouldn't brought in by EMS from home, reported dizziness while standing in her kitchen. She is unable to characterize whether it was lightheadedness or vertigo. She states, "felt dizzy.". Patient states "felt like colored were coming together.". She had no headache, no vomiting, but did have some mild nausea. No chest pain. No shortness of breath. No fevers. Denies any fall or trauma. EMS reported CBG to be 200. Denies any recent illness.  Any focal weakness. No headache or neck pain. Denies any numbness or tingling. (Consider location/radiation/quality/duration/timing/severity/associated sxs/prior treatment) HPI  Past Medical History  Diagnosis Date  . Hypertension   . Hyperlipidemia   . Diabetes mellitus   . Arthritis     Past Surgical History  Procedure Date  . Bladder tacking   . Bladder suspension     History reviewed. No pertinent family history.  History  Substance Use Topics  . Smoking status: Former Smoker    Quit date: 11/29/1980  . Smokeless tobacco: Never Used  . Alcohol Use: No    OB History    Grav Para Term Preterm Abortions TAB SAB Ect Mult Living                  Review of Systems  All other systems reviewed and are negative.    Allergies  Review of patient's allergies indicates no known allergies.  Home Medications   Current Outpatient Rx  Name Route Sig Dispense Refill  . ASPIRIN 81 MG PO CHEW Oral Chew 81 mg by mouth daily.      . ENALAPRIL MALEATE 20 MG PO TABS Oral Take 20 mg by mouth daily.      . INSULIN GLARGINE 100 UNIT/ML [place] SOLN Subcutaneous Inject 12 Units into the skin every morning.    . INSULIN SYRINGE-NEEDLE U-100 31G X 5/16" 0.3 ML MISC  [place] ultra thin. Use for injections as needed. 100 each 3  . PRESERVISION AREDS PO Oral  Take 1 tablet by mouth daily.      BP 111/61  Pulse 83  Temp(Src) 97.6 F (36.4 C) (Oral)  Resp 16  SpO2 99%  Physical Exam  Nursing note and vitals reviewed. Constitutional: She is oriented to person, place, and time. She appears well-developed and well-nourished.  HENT:  Head: Normocephalic and atraumatic.  Eyes: Conjunctivae and EOM are normal. Pupils are equal, round, and reactive to light.  Neck: Neck supple.  Cardiovascular: Normal rate and regular rhythm.  Exam reveals no gallop and no friction rub.   No murmur heard. Pulmonary/Chest: Breath sounds normal. She has no wheezes. She has no rales. She exhibits no tenderness.  Abdominal: Soft. Bowel sounds are normal. She exhibits no distension. There is no tenderness. There is no rebound and no guarding.  Musculoskeletal: Normal range of motion.  Neurological: She is alert and oriented to person, place, and time. She has normal reflexes. She displays normal reflexes. No cranial nerve deficit. She exhibits normal muscle tone. Coordination normal.  Skin: Skin is warm and dry. No rash noted.  Psychiatric: She has a normal mood and affect.    ED Course  Procedures (including critical care time)  Labs Reviewed  CBC - Abnormal; Notable for the following:    WBC 3.7 (*)  All other components within normal limits  GLUCOSE, CAPILLARY - Abnormal; Notable for the following:    Glucose-Capillary 195 (*)    All other components within normal limits  POCT I-STAT, CHEM 8 - Abnormal; Notable for the following:    Glucose, Bld 234 (*)    All other components within normal limits  POCT I-STAT TROPONIN I  I-STAT TROPONIN I  I-STAT, CHEM 8  URINALYSIS, ROUTINE W REFLEX MICROSCOPIC  POCT CBG MONITORING   Dg Chest 2 View  09/18/2011  *RADIOLOGY REPORT*  Clinical Data: Dizziness, weakness  CHEST - 2 VIEW  Comparison: Chest radiograph 10/06/2009  Findings: Normal mediastinum and heart silhouette ectatic aorta. There is trace atelectasis  at the left lung base unchanged from prior.  No pulmonary edema.  No new infiltrate. Degenerative osteophytosis of the thoracic spine.  IMPRESSION: No acute cardiopulmonary process.  Original Report Authenticated By: Genevive Bi, M.D.   Ct Head Wo Contrast  09/18/2011  *RADIOLOGY REPORT*  Clinical Data: Dizziness  CT HEAD WITHOUT CONTRAST  Technique:  Contiguous axial images were obtained from the base of the skull through the vertex without contrast.  Comparison: The sinus CT 02/24/2004  Findings: No acute intracranial hemorrhage.  No focal mass lesion. No CT evidence of acute infarction.   No midline shift or mass effect.  No hydrocephalus.  Basilar cisterns are patent.  Mild generalized cortical atrophy.  Very mild periventricular white matter hypodensities.  There is a permeative pattern of the clivus and sphenoid bone which is unchanged.   No fluid the paranasal sinuses  IMPRESSION:  1.  No acute intracranial findings. 2.  Mild atrophy.  Original Report Authenticated By: Genevive Bi, M.D.     No diagnosis found.    MDM  Pt is seen and examined;  Initial history and physical completed.  Will follow.    ECG, CBG, CT head, labs, UA, IVF.       Date: 09/18/2011  Rate: 82  Rhythm: normal sinus rhythm  QRS Axis: normal  Intervals: normal  ST/T Wave abnormalities: normal  Conduction Disutrbances:none  Narrative Interpretation:   Old EKG Reviewed: unchanged    Results for orders placed during the hospital encounter of 09/18/11  CBC      Component Value Range   WBC 3.7 (*) 4.0 - 10.5 (K/uL)   RBC 4.00  3.87 - 5.11 (MIL/uL)   Hemoglobin 12.5  12.0 - 15.0 (g/dL)   HCT 16.1  09.6 - 04.5 (%)   MCV 91.0  78.0 - 100.0 (fL)   MCH 31.3  26.0 - 34.0 (pg)   MCHC 34.3  30.0 - 36.0 (g/dL)   RDW 40.9  81.1 - 91.4 (%)   Platelets 167  150 - 400 (K/uL)  GLUCOSE, CAPILLARY      Component Value Range   Glucose-Capillary 195 (*) 70 - 99 (mg/dL)   Comment 1 Documented in Chart      Comment 2 Notify RN    POCT I-STAT, CHEM 8      Component Value Range   Sodium 138  135 - 145 (mEq/L)   Potassium 3.9  3.5 - 5.1 (mEq/L)   Chloride 102  96 - 112 (mEq/L)   BUN 17  6 - 23 (mg/dL)   Creatinine, Ser 7.82  0.50 - 1.10 (mg/dL)   Glucose, Bld 956 (*) 70 - 99 (mg/dL)   Calcium, Ion 2.13  0.86 - 1.32 (mmol/L)   TCO2 27  0 - 100 (mmol/L)   Hemoglobin 13.3  12.0 - 15.0 (g/dL)   HCT 16.1  09.6 - 04.5 (%)  POCT I-STAT TROPONIN I      Component Value Range   Troponin i, poc 0.00  0.00 - 0.08 (ng/mL)   Comment 3            Dg Chest 2 View  09/18/2011  *RADIOLOGY REPORT*  Clinical Data: Dizziness, weakness  CHEST - 2 VIEW  Comparison: Chest radiograph 10/06/2009  Findings: Normal mediastinum and heart silhouette ectatic aorta. There is trace atelectasis at the left lung base unchanged from prior.  No pulmonary edema.  No new infiltrate. Degenerative osteophytosis of the thoracic spine.  IMPRESSION: No acute cardiopulmonary process.  Original Report Authenticated By: Genevive Bi, M.D.           9:58 AM  Reassessed , feels better,  Await urine results.   Athanasia Stanwood A. Patrica Duel, MD 09/18/11 [id number]

## 2011-09-18 NOTE — ED Notes (Signed)
Pt is unable to void at this time. Pt will try again

## 2011-09-18 NOTE — ED Notes (Signed)
Pt report received from Tia, Charity fundraiser. Pt coming to cdu#3.

## 2011-09-18 NOTE — ED Notes (Signed)
Pt report to Marylu Lund, Charity fundraiser.  Pt taken to CDU3.

## 2011-09-18 NOTE — ED Notes (Signed)
Pt. Ambulates without asst.

## 2011-09-20 ENCOUNTER — Encounter: Payer: Self-pay | Admitting: Pharmacist

## 2011-09-20 ENCOUNTER — Ambulatory Visit (INDEPENDENT_AMBULATORY_CARE_PROVIDER_SITE_OTHER): Payer: Medicare Other | Admitting: Pharmacist

## 2011-09-20 VITALS — BP 148/88 | HR 96 | Ht 63.0 in | Wt 170.0 lb

## 2011-09-20 DIAGNOSIS — H409 Unspecified glaucoma: Secondary | ICD-10-CM

## 2011-09-20 DIAGNOSIS — E119 Type 2 diabetes mellitus without complications: Secondary | ICD-10-CM

## 2011-09-20 MED ORDER — NATEGLINIDE 120 MG PO TABS: 120.0000 mg | ORAL_TABLET | Freq: Three times a day (TID) | ORAL | Status: DC

## 2011-09-20 NOTE — Assessment & Plan Note (Signed)
Diabetes of 15-20 yrs duration currently under improving control of blood glucose based on  . Lab Results  Component Value Date   HGBA1C 8.0 07/22/2011     ,home fasting CBG readings of the low 100's with some fasting CBG's in the 50's and random CBG readings wide ranging from the 100s-300's. Control is suboptimal due to need for mealtime insulin for erratic postprandial control.  Able to verbalize appropriate hypoglycemia management plan. Decreased dose of basal insulin Lantus (insulin glargine) to 10 units every morning.  Patient was started on Starlix 120mg  30 minutes before meals.  She is to start taking with her biggest meal of the day.  If she sees improvement in her BG and doesn't notice BG values that are too low, she is to start taking Starlix TID with each meal.  Written patient instructions provided.  Follow up in  Pharmacist Clinic Visit on Tues February 19 at 9 AM.   Total time in face to face counseling 50 minutes.  Patient seen with Tamala Bari, PharmD Candidate, Albertine Grates, Pharmacy Resident, and Despina Hick, MD Resident.

## 2011-09-20 NOTE — Progress Notes (Signed)
  Subjective:    Patient ID: Hayley Fisher, female    DOB: Mar 26, 1931, 78 y.o.   MRN: [id number]  HPI Patient presents in a pleasant mood to discuss her diabetes. She went to the ER 2 days ago. She began to feel tired early that morning after starting chores, feeling nauseated, but wasn't able to check her BG. The paramedics told her that her BG was 200. She reports BG in the 300's only once, and BG normally in the high 100's, low 200's. Her reported lowest BG was 55 and she reports low BG values only once or twice a week. She reports early morning BG is in the low 100's.  From her downloaded BG readings, trends show her BG to be higher in the afternoon and evening hours.  She continues to be hesitant to suggestions of starting any more insulin injections.  She has followed through with her diet and exercise plans, and has lost about 10 lbs within the last month.    Review of Systems     Objective:   Physical Exam        Assessment & Plan:   Diabetes of 15-20 yrs duration currently under improving control of blood glucose based on  . Lab Results  Component Value Date   HGBA1C 8.0 07/22/2011     ,home fasting CBG readings of the low 100's with some fasting CBG's in the 50's and random CBG readings wide ranging from the 100s-300's. Control is suboptimal due to need for mealtime insulin for erratic postprandial control.  Able to verbalize appropriate hypoglycemia management plan. Decreased dose of basal insulin Lantus (insulin glargine) to 10 units every morning.  Patient was started on Starlix 120mg  30 minutes before meals.  She is to start taking with her biggest meal of the day.  If she sees improvement in her BG and doesn't notice BG values that are too low, she is to start taking Starlix TID with each meal.  Written patient instructions provided.  Follow up in  Pharmacist Clinic Visit on Tues February 19 at 9 AM.   Total time in face to face counseling 50 minutes.  Patient seen with Tamala Bari, PharmD Candidate, Albertine Grates, Pharmacy Resident, and Despina Hick, MD Resident.

## 2011-09-20 NOTE — Patient Instructions (Addendum)
It was good to see you today!  We are decreasing your Lantus to 10 units every morning.  We sent in a prescription for Starlix.  Start taking 1 tablet (120mg ) with your biggest meal of the day (30 minutes before you eat) and try to take it 3 times a day with each of your meals if your blood sugar doesn't get too low.  We scheduled you an appointment with Dr. Raymondo Band on Tues, February 19 at [facility]

## 2011-09-20 NOTE — Progress Notes (Deleted)
°  Subjective:    Patient ID: Hayley Fisher, female    DOB: 08-Jan-1931, 78 y.o.   MRN: [id number]  HPI Patient presents in a pleasant mood to discuss her diabetes.  She went to the ER 2 days ago.  She began to feel tired early that morning after starting chores, feeling nauseated, but wasn't able to check her BG.  The paramedics told her that her BG was 200.  She reports BG in the 300's only once, and BG normally in the high 100's, low 200's.  Her reported lowest BG was 55 and she reports low BG values only once or twice a week.  She reports early morning BG is in the low 100's.   Review of Systems     Objective:   Physical Exam        Assessment & Plan:

## 2011-09-20 NOTE — Progress Notes (Signed)
  Subjective:    Patient ID: Hayley Fisher, female    DOB: 12/21/1930, 78 y.o.   MRN: [id number]  HPI  Reviewed and agree with Dr. Koval's management     Review of Systems     Objective:   Physical Exam        Assessment & Plan:   

## 2011-09-29 ENCOUNTER — Ambulatory Visit (INDEPENDENT_AMBULATORY_CARE_PROVIDER_SITE_OTHER): Payer: Medicare Other | Admitting: Ophthalmology

## 2011-09-30 ENCOUNTER — Ambulatory Visit
Admission: RE | Admit: 2011-09-30 | Discharge: 2011-09-30 | Disposition: A | Discharge status: Home / Self Care | Payer: Medicare Other | Source: Ambulatory Visit | Attending: Family Medicine | Admitting: Family Medicine

## 2011-09-30 DIAGNOSIS — Z1231 Encounter for screening mammogram for malignant neoplasm of breast: Secondary | ICD-10-CM

## 2011-10-11 ENCOUNTER — Ambulatory Visit (INDEPENDENT_AMBULATORY_CARE_PROVIDER_SITE_OTHER): Payer: Medicare Other | Admitting: Pharmacist

## 2011-10-11 ENCOUNTER — Encounter: Payer: Self-pay | Admitting: Pharmacist

## 2011-10-11 VITALS — BP 116/74 | HR 88 | Ht 63.0 in | Wt 168.1 lb

## 2011-10-11 DIAGNOSIS — E119 Type 2 diabetes mellitus without complications: Secondary | ICD-10-CM

## 2011-10-11 MED ORDER — GLUCOSE BLOOD VI STRP: ORAL_STRIP | Status: DC

## 2011-10-11 NOTE — Progress Notes (Signed)
  Subjective:    Patient ID: Hayley Fisher, female    DOB: 1931/08/20, 78 y.o.   MRN: [id number]  HPI  Hayley Fisher is a 78yoF that presented for follow-up for her diabetes. At her previous visit, her Lantus dose was decreased from 12 units to 10 units every morning due to reports of hypoglycemia early in the morning. Since this dose decrease, she denies any hypoglycemic events. She was also started on Starlix 120mg  TID with meals. She reports taking the medication as directed but states that she doesn't feel like it's helping much as her CBGs remain >200s. She is running low on her test-strip drums so has not been able to test more often. Patient brought her AccuCheck Compact Plus to the clinic today--AM fasting readings range from 92-170's and bedtime readings 197-308. She continues to work on diet and exercise; she has lost an additional 2 lbs since her last visit with [place].   Review of Systems     Objective:   Physical Exam  Filed Vitals:   10/11/11 0857  Height: 5\' 3"  (1.6 m)  Weight: 168 lb 1.6 oz (76.25 kg)      Assessment & Plan:   Diabetes of 15-20  yrs duration currently under fair control of blood glucose based on  . Lab Results  Component Value Date   HGBA1C 8.0 07/22/2011     ,home fasting CBG readings of 92-170's and random CBG readings of 197-308. Control is suboptimal due to uncontrolled post-prandial CBGs. Denies hypoglycemic events.  Continued 10 units of basal insulin Lantus (insulin glargine) and Starlix 120mg  TID with meals. Instructed patient to test daily in the morning prior to eating as well as 2 hours after her largest meal of the day. Written patient instructions provided. Will consider mealtime insulin if 2-hr PPBG remain elevated on Starlix.  Follow up in  Pharmacist Clinic Visit 11/08/11 at 9am.   Total time in face to face counseling 45 minutes.  Patient seen with Su Hilt, PharmD Candidate and Benjaman Pott, Pharmacy Resident.

## 2011-10-11 NOTE — Patient Instructions (Addendum)
It was good to see you!  1) Continue injecting Lantus 10 units every morning. 2) Continue Starlix 120mg  three times a day before each meal. 3) Continue to check your blood sugar every morning. If the numbers in the 80's or lower often, call the pharmacy clinic to adjust your lantus. 3) Start checking your blood sugar 2 hours after the biggest meal of the day. 4) Keep up the good work with diet and exercise. 5) Call the clinic with the number of your mail-order pharmacy.  Follow-up in the pharmacy clinic in 1 month (March 19th at 9:00AM)

## 2011-10-11 NOTE — Assessment & Plan Note (Signed)
Diabetes of 15-20  yrs duration currently under fair control of blood glucose based on  . Lab Results  Component Value Date   HGBA1C 8.0 07/22/2011     ,home fasting CBG readings of 92-170's and random CBG readings of 197-308. Control is suboptimal due to uncontrolled post-prandial CBGs. Denies hypoglycemic events.  Continued 10 units of basal insulin Lantus (insulin glargine) and Starlix 120mg  TID with meals. Instructed patient to test daily in the morning prior to eating as well as 2 hours after her largest meal of the day. Written patient instructions provided. Will consider mealtime insulin if 2-hr PPBG remain elevated on Starlix.  Follow up in  Pharmacist Clinic Visit 11/08/11 at 9am.   Total time in face to face counseling 45 minutes.  Patient seen with Su Hilt, PharmD Candidate and Benjaman Pott, Pharmacy Resident.

## 2011-10-11 NOTE — Progress Notes (Signed)
  Subjective:    Patient ID: Hayley Fisher, female    DOB: 12/29/1930, 78 y.o.   MRN: [id number]  HPI  Reviewed and agree with Dr. Koval's management     Review of Systems     Objective:   Physical Exam        Assessment & Plan:   

## 2011-10-14 ENCOUNTER — Telehealth: Payer: Self-pay | Admitting: Pharmacist

## 2011-10-14 NOTE — Telephone Encounter (Signed)
Contacted patient in follow up of her "running out of supplies".  Patient requested additional supplies of "lancet drums"  [facility] contacted to increase frequency of supply testing to 3 times daily.   Patient will need more testing as she is potentially increasing to multishot per day regimen.     She is currently out of lancet drums.   [facility] [phone]) will fax request to attn of Dr. Everrett Coombe.

## 2011-11-08 ENCOUNTER — Ambulatory Visit (INDEPENDENT_AMBULATORY_CARE_PROVIDER_SITE_OTHER): Payer: Medicare Other | Admitting: Pharmacist

## 2011-11-08 ENCOUNTER — Encounter: Payer: Self-pay | Admitting: Pharmacist

## 2011-11-08 VITALS — BP 151/77 | HR 93 | Ht 63.0 in | Wt 164.0 lb

## 2011-11-08 DIAGNOSIS — I10 Essential (primary) hypertension: Secondary | ICD-10-CM

## 2011-11-08 DIAGNOSIS — H409 Unspecified glaucoma: Secondary | ICD-10-CM | POA: Diagnosis not present

## 2011-11-08 DIAGNOSIS — H4011X0 Primary open-angle glaucoma, stage unspecified: Secondary | ICD-10-CM | POA: Diagnosis not present

## 2011-11-08 DIAGNOSIS — H26499 Other secondary cataract, unspecified eye: Secondary | ICD-10-CM | POA: Diagnosis not present

## 2011-11-08 DIAGNOSIS — E119 Type 2 diabetes mellitus without complications: Secondary | ICD-10-CM

## 2011-11-08 LAB — POCT GLYCOSYLATED HEMOGLOBIN (HGB A1C): Hemoglobin A1C: 8.5

## 2011-11-08 NOTE — Assessment & Plan Note (Addendum)
Diabetes of many yrs duration currently under fair control of blood glucose based on: Lab Results  Component Value Date   HGBA1C 8.5 11/08/2011  home fasting CBG readings of 100-160 and evening CBG readings consistently in high 200s. Control is suboptimal due to insulin deficiency despite weight loss. Denies hypoglycemic events.  Able to verbalize appropriate hypoglycemia management plan. Started bolus insulin Novolog (insulin aspart) 3 units with evening meal. Samples of this drug were given to the patient, quantity 10 mL, Lot Number [id number] Continued basal insulin Lantus (insulin glargine) 10 units with breakfast. Decreased Starlix 120 mg frequency to BID with Breakfast and Lunch. Encouraged patient to continue exercise and weight loss plan. Written patient instructions provided.  Follow up in  Pharmacist Clinic Visit on April 23.   Total time in face to face counseling 30 minutes.  Patient seen with Birdena Crandall, PharmD Candidate and [place] Smith, Pharmacy Resident.

## 2011-11-08 NOTE — Assessment & Plan Note (Signed)
Blood pressure: not well controlled. Based on today's reading of 151/77 Pt has not yet taken her daily enalapril 20 mg. Instructed patient to take her BP meds prior to coming to next appointment.

## 2011-11-08 NOTE — Progress Notes (Signed)
  Subjective:    Patient ID: Hayley Fisher, female    DOB: October 03, 1930, 78 y.o.   MRN: [id number]  HPI   Reviewed and agree with Dr. Macky Lower documentation and management.     Review of Systems     Objective:   Physical Exam        Assessment & Plan:

## 2011-11-08 NOTE — Patient Instructions (Addendum)
It was a pleasure to see you today.  - Continue to take your 10 units of your LANTUS insulin every morning. - Continue to take your STARLIX at breakfast and lunch. - Stop taking STARLIX with your evening meal  - Start taking 3 units of NOVOLOG insulin in the evening with your dinner. (We have provided you with a sample) - Check blood sugar two times a day with some readings before dinner and 2 hours after dinner. - Follow up with me on April 23rd at 9am - If you have any questions or problems with your medication you can call me directly at [phone]

## 2011-11-08 NOTE — Progress Notes (Signed)
  Subjective:    Patient ID: Hayley Fisher, female    DOB: 1930-12-10, 78 y.o.   MRN: [id number]  HPI  Patient arrives in good spirits, states she left her meter at home. Patient has continued to work on weight loss, has lost an additional 4 lbs. Congratulated her on progress, and she is motivated to continue to work on her weight. No complaints on current regimen. Has some concerns about medications affecting her liver or kidnies - assured her that   Blood sugar has been running between 100-160 in the morning. Bedtime blood sugars have been regularly in the high 200's with some in the high 100's. Reports no readings in the 300 range.  Denies any hypoglycemic events. States that she continues to exercise daily, most days of the week.    Review of Systems As per above     Objective:   Physical Exam        Assessment & Plan:   Diabetes of many yrs duration currently under fair control of blood glucose based on: Lab Results  Component Value Date   HGBA1C 8.5 11/08/2011  home fasting CBG readings of 100-160 and evening CBG readings consistently in high 200s. Control is suboptimal due to insulin deficiency despite weight loss. Denies hypoglycemic events.  Able to verbalize appropriate hypoglycemia management plan. Started bolus insulin Novolog (insulin aspart) 3 units with evening meal. Samples of this drug were given to the patient, quantity 10 mL, Lot Number [id number] Continued basal insulin Lantus (insulin glargine) 10 units with breakfast. Decreased Starlix 120 mg frequency to BID with Breakfast and Lunch. Encouraged patient to continue exercise and weight loss plan. Written patient instructions provided.  Follow up in  Pharmacist Clinic Visit on April 23.   Total time in face to face counseling 30 minutes.  Patient seen with Birdena Crandall, PharmD Candidate and [place] Smith, Pharmacy Resident.   Blood pressure: not well controlled. Based on today's reading of 151/77 Pt has not yet taken her  daily enalapril 20 mg. Instructed patient to take her BP meds prior to coming to next appointment.   11/08/2011 9:50 AM

## 2011-11-16 ENCOUNTER — Other Ambulatory Visit: Payer: Self-pay

## 2011-11-16 ENCOUNTER — Telehealth: Payer: Self-pay | Admitting: Family Medicine

## 2011-11-16 ENCOUNTER — Ambulatory Visit (HOSPITAL_COMMUNITY)
Admission: RE | Admit: 2011-11-16 | Discharge: 2011-11-16 | Disposition: A | Discharge status: Home / Self Care | Payer: Medicare Other | Source: Ambulatory Visit | Attending: Family Medicine | Admitting: Family Medicine

## 2011-11-16 ENCOUNTER — Ambulatory Visit (INDEPENDENT_AMBULATORY_CARE_PROVIDER_SITE_OTHER): Payer: Medicare Other | Admitting: Family Medicine

## 2011-11-16 VITALS — BP 130/70 | HR 92 | Temp 98.2°F | Wt 168.0 lb

## 2011-11-16 DIAGNOSIS — R42 Dizziness and giddiness: Secondary | ICD-10-CM | POA: Diagnosis not present

## 2011-11-16 DIAGNOSIS — R002 Palpitations: Secondary | ICD-10-CM | POA: Diagnosis not present

## 2011-11-16 LAB — BASIC METABOLIC PANEL
BUN: 15 mg/dL (ref 6–23)
Calcium: 9 mg/dL (ref 8.4–10.5)
Creat: 0.82 mg/dL (ref 0.50–1.10)

## 2011-11-16 NOTE — Telephone Encounter (Signed)
Patient started having a rapid heart beat last night that has continued today.  She would like to speak to a nurse about it.

## 2011-11-16 NOTE — Telephone Encounter (Signed)
Patient states she   started having  having feeling of rapid heartbeat last night  while sitting.  This feeling continued all night  but she gradually went to sleep.  She has continued feeling this sensation today . At first of converstation she stated she did not feel this now but later in conversation she does.  Ask her to count her pulse and she counted 15 beats  for 15 seconds However she still feels it pounding. states this AM her BS was 80.  After lunch 224. Yesterday fasting 71 and at bedtime was 200. Consulted with Dr. Sheffield Slider and Dr. Mahala Menghini. Advised patient to come to office now and will evaluate.

## 2011-11-16 NOTE — Patient Instructions (Signed)
Thank you for coming in today. Continue your medicines as normal.  If it happens again check your blood sugar.  We will order a 48 hour EKG called a Holter Monitor.  We will also get some blood work.  Call or go to the emergency room if you get worse, have trouble breathing, have chest pains, or palpitations.  Follow up with Dr. Ashley Royalty about 1-2 weeks after the monitor or within 3-4 weeks.

## 2011-11-16 NOTE — Progress Notes (Signed)
Hayley Fisher is a 78 y.o. female who presents to [facility] today for palpitations. Starting yesterday evening [name] noted palpitations in her chest which lasted all night and into today. They're presently occurring. She denies any pain fevers trouble breathing swelling orthopnea.  She feels well otherwise and does not have a significant cardiac history. While she was having palpitations she checked her blood sugar and found it to be in the 200s.  Her diabetes medications were recently changed.     PMH, SH reviewed: Significant for diabetes ROS as above otherwise neg. No Chest pain, palpitations, SOB, Fever, Chills, Abd pain, N/V/D.  Medications reviewed. Current Outpatient Prescriptions  Medication Sig Dispense Refill  . aspirin 81 MG chewable tablet Chew 81 mg by mouth daily.        . brimonidine (ALPHAGAN P) 0.1 % SOLN Place 1 drop into both eyes 2 (two) times daily.      . enalapril (VASOTEC) 20 MG tablet Take 20 mg by mouth daily.        Marland Kitchen glucose blood (ACCU-CHEK COMPACT TEST DRUM) test strip Use as instructed to test twice daily (before breakfast and 2 hours after your largest meal).  100 each  3  . insulin aspart (NOVOLOG) 100 UNIT/ML injection Inject 3 Units into the skin daily before supper.      . insulin glargine (LANTUS) 100 UNIT/ML injection Inject 10 Units into the skin every morning.      . Insulin Syringe-Needle U-100 31G X 5/16" 0.3 ML MISC Aimsco ultra thin. Use for injections as needed.  100 each  3  . Multiple Vitamins-Minerals (PRESERVISION AREDS PO) Take 1 tablet by mouth daily.      . nateglinide (STARLIX) 120 MG tablet Take 120 mg by mouth 2 (two) times daily with breakfast and lunch.        Exam:  BP 130/70  Pulse 92  Temp 98.2 F (36.8 C)  Wt 168 lb (76.204 kg) Gen: Well NAD HEENT: EOMI,  MMM Lungs: CTABL Nl WOB Heart: RRR no MRG Abd: NABS, NT, ND Exts: Non edematous BL  LE, warm and well perfused.   EKG; normal sinus rhythm at 77 beats per minute with normal  intervals normal PR segment normal QRS normal ST and normal T waves.  Regular rhythm

## 2011-11-16 NOTE — Assessment & Plan Note (Signed)
I am not sure the etiology of the palpitations. However with a normal vital signs normal EKG normal fingerstick glucose I suspect the etiology is benign.  However this patient is 78 years old and does have diabetes.  Her pretest probability of having a dangerous process is higher due to her age and medical comorbidities. Therefore plan to further assess with a TSH, basic metabolic panel and 48 hour Holter monitor.  She will followup with her primary care doctor within 3 weeks or after the Holter monitor.  I discussed warning signs such as chest pain or trouble breathing with the patient who expresses understanding.

## 2011-11-17 LAB — TSH: TSH: 3.794 u[IU]/mL (ref 0.350–4.500)

## 2011-11-17 NOTE — Progress Notes (Signed)
Opened addendum to put in order for EKG . Has already been placed.

## 2011-11-28 ENCOUNTER — Encounter (INDEPENDENT_AMBULATORY_CARE_PROVIDER_SITE_OTHER): Payer: Medicare Other

## 2011-11-28 DIAGNOSIS — R002 Palpitations: Secondary | ICD-10-CM | POA: Diagnosis not present

## 2011-12-06 ENCOUNTER — Other Ambulatory Visit: Payer: Self-pay | Admitting: Family Medicine

## 2011-12-06 MED ORDER — ENALAPRIL MALEATE 20 MG PO TABS: 20.0000 mg | ORAL_TABLET | Freq: Every day | ORAL | Status: DC

## 2011-12-13 ENCOUNTER — Ambulatory Visit (INDEPENDENT_AMBULATORY_CARE_PROVIDER_SITE_OTHER): Payer: Medicare Other | Admitting: Pharmacist

## 2011-12-13 ENCOUNTER — Encounter: Payer: Self-pay | Admitting: Pharmacist

## 2011-12-13 VITALS — BP 136/68 | HR 86 | Ht 62.5 in | Wt 166.0 lb

## 2011-12-13 DIAGNOSIS — E119 Type 2 diabetes mellitus without complications: Secondary | ICD-10-CM

## 2011-12-13 NOTE — Patient Instructions (Signed)
Great to see you today.  Change Lantus to 8 units each morning.  Change Novolog to 4 units prior to evening meal.  Next visit with Dr. Ashley Royalty in the next 2 weeks.

## 2011-12-13 NOTE — Progress Notes (Signed)
  Subjective:    Patient ID: Hayley Fisher, female    DOB: 1931-01-01, 78 y.o.   MRN: [id number]  HPI  Patient arrives in good spirits.  She is happy with her blood sugar control lately.  She notes two episodes of low blood sugar readings but states that her readings are much improved.   Patient is NOT interesting in more shots per day.  She would like to only take one shot per day but realizes that her readings are better with the Novolog.   Patient was interested in learning more about her Holter Monitor report from [facility].  Results were suggested to be reviewed at next visit with Dr. Ashley Royalty.  (report printed and placed in Dr. Ashley Royalty box)  Review of Systems     Objective:   Physical Exam  Review of blood glucose meter reveals 7 day average cBG = 144  AM fasting readings are consistently 80-120  Late PM pre and post dinner remain higher that goals.   Routine readings > 200      Assessment & Plan:   Diabetes of many yrs duration currently under improved control of blood glucose based on   Lab Results  Component Value Date   HGBA1C 8.5 11/08/2011    ,home fasting CBG readings of <150 AND 7 day average of 144.  Patient was congratulated on continued maintenance of wt at 166lbs. CBG control is suboptimal due to lack of late post prandial coverage with breakfast and lunch and patient unwillingness to take more that 2 shots per day. Reports one hypoglycemic event.  Able to verbalize appropriate hypoglycemia management plan. Decreased dose of basal insulin Lantus (insulin glargine) to 8 units QAM. Increased dose of rapid insulin Novolog (insulin aspart) to 4 units prior to evening meal.  Written patient instructions provided.  Next visit with Dr. Ashley Royalty.   Follow up in  Pharmacist Clinic Visit PRN for readjustment of drug therapy regimen.   Total time in face to face counseling 35 minutes.

## 2011-12-13 NOTE — Assessment & Plan Note (Signed)
  Diabetes of many yrs duration currently under improved control of blood glucose based on   Lab Results  Component Value Date   HGBA1C 8.5 11/08/2011    ,home fasting CBG readings of <150 AND 7 day average of 144.  Patient was congratulated on continued maintenance of wt at 166lbs. CBG control is suboptimal due to lack of late post prandial coverage with breakfast and lunch and patient unwillingness to take more that 2 shots per day. Reports one hypoglycemic event.  Able to verbalize appropriate hypoglycemia management plan. Decreased dose of basal insulin Lantus (insulin glargine) to 8 units QAM. Increased dose of rapid insulin Novolog (insulin aspart) to 4 units prior to evening meal.  Written patient instructions provided.  Next visit with Dr. Ashley Royalty.   Follow up in  Pharmacist Clinic Visit PRN for readjustment of drug therapy regimen.   Total time in face to face counseling 35 minutes.

## 2011-12-13 NOTE — Progress Notes (Signed)
  Subjective:    Patient ID: Hayley Fisher, female    DOB: 11/10/1930, 78 y.o.   MRN: [id number]  HPI  Reviewed and agree with Dr. Koval's management     Review of Systems     Objective:   Physical Exam        Assessment & Plan:   

## 2012-01-02 ENCOUNTER — Encounter: Payer: Self-pay | Admitting: Family Medicine

## 2012-01-02 ENCOUNTER — Ambulatory Visit (INDEPENDENT_AMBULATORY_CARE_PROVIDER_SITE_OTHER): Payer: Medicare Other | Admitting: Family Medicine

## 2012-01-02 VITALS — BP 148/85 | HR 84 | Ht 63.0 in | Wt 163.0 lb

## 2012-01-02 DIAGNOSIS — I10 Essential (primary) hypertension: Secondary | ICD-10-CM

## 2012-01-02 DIAGNOSIS — E119 Type 2 diabetes mellitus without complications: Secondary | ICD-10-CM | POA: Diagnosis not present

## 2012-01-02 MED ORDER — ENALAPRIL MALEATE 20 MG PO TABS: 20.0000 mg | ORAL_TABLET | Freq: Every day | ORAL | Status: DC

## 2012-01-02 NOTE — Patient Instructions (Signed)
Thank you for coming in today, it was good to see you I would like for you to come in one month for your A1c, you can stop by the lab for this I will see you back in 2-3 months If the pain in your abdomen continues to bother you please return to see me sooner.

## 2012-01-08 NOTE — Progress Notes (Signed)
  Subjective:    Patient ID: Hayley Fisher, female    DOB: June 20, 1931, 78 y.o.   MRN: [id number]  HPI  1. DM:  Here to f/u on DM.  Has been working with pharmacy clinic to better control diabetes.  Currently using starlix in am and at lunch, lantus 8 units qpm and novolog 4 units before dinner.  She feels like her glucose is better controlled on this current regimen.  She brings in her meter today, that shows she still is getting some lows into the 60's-70's.  This is typically late at night or in the morning when she has eaten early in the evening.  At other times she has glucose >200 late at night and in the am, and her glucose seems to fluctuate quite a bit.  Overall though she doesn't feel that she is getting into the "symptomatic lows" that she was having before.  She last saw her eye doctor 3 months ago, and has another appt in a few weeks.   2. HTN:  Needs refills on enalapril.  Has not taken her BP meds this morning, and typically does not check this at home.  She is eating better to control her DM and BP better, incorporating more vegetables and cutting back on salty foods.  She did have some palpitations a few months ago and was given 24 hour holter that did not show any problems.  She denies any palpiations since that episode a few months ago.   Review of Systems  Constitutional: Negative for activity change and appetite change.  Respiratory: Negative for cough, chest tightness and shortness of breath.   Cardiovascular: Negative for chest pain, palpitations and leg swelling.  Neurological: Negative for dizziness and light-headedness.       Objective:   Physical Exam  Constitutional: She is oriented to person, place, and time. She appears well-developed and well-nourished. No distress.  Neck: Neck supple.  Cardiovascular: Normal rate, regular rhythm and normal heart sounds.   Pulmonary/Chest: Effort normal and breath sounds normal.  Musculoskeletal: She exhibits no edema.    Neurological: She is alert and oriented to person, place, and time.          Assessment & Plan:

## 2012-01-08 NOTE — Assessment & Plan Note (Signed)
BP not well controlled, given her other co-morbidities based on todays reading of 148/85.  She has not taken her enalapril today yet.  Instructed to take meds prior to coming in to next appointment.

## 2012-01-08 NOTE — Assessment & Plan Note (Signed)
CBG's improved on current regimen, continue for now.  Last A1c only 2 months ago, will enter future order for a1c next month and make adjustments as needed.  Foot exam done today.

## 2012-01-10 DIAGNOSIS — H409 Unspecified glaucoma: Secondary | ICD-10-CM | POA: Diagnosis not present

## 2012-01-10 DIAGNOSIS — H4011X0 Primary open-angle glaucoma, stage unspecified: Secondary | ICD-10-CM | POA: Diagnosis not present

## 2012-02-03 ENCOUNTER — Other Ambulatory Visit (INDEPENDENT_AMBULATORY_CARE_PROVIDER_SITE_OTHER): Payer: Medicare Other

## 2012-02-03 DIAGNOSIS — E119 Type 2 diabetes mellitus without complications: Secondary | ICD-10-CM

## 2012-02-03 NOTE — Progress Notes (Signed)
A1c = 8.3 % BAJORDAN, MLS

## 2012-04-17 ENCOUNTER — Other Ambulatory Visit: Payer: Self-pay | Admitting: *Deleted

## 2012-04-17 DIAGNOSIS — E119 Type 2 diabetes mellitus without complications: Secondary | ICD-10-CM

## 2012-04-17 MED ORDER — INSULIN GLARGINE 100 UNIT/ML ~~LOC~~ SOLN: 8.0000 [IU] | SUBCUTANEOUS | Status: DC

## 2012-07-24 DIAGNOSIS — H409 Unspecified glaucoma: Secondary | ICD-10-CM | POA: Diagnosis not present

## 2012-07-24 DIAGNOSIS — H4011X0 Primary open-angle glaucoma, stage unspecified: Secondary | ICD-10-CM | POA: Diagnosis not present

## 2012-07-24 DIAGNOSIS — H26499 Other secondary cataract, unspecified eye: Secondary | ICD-10-CM | POA: Diagnosis not present

## 2012-07-30 ENCOUNTER — Ambulatory Visit (INDEPENDENT_AMBULATORY_CARE_PROVIDER_SITE_OTHER): Payer: Medicare Other | Admitting: Family Medicine

## 2012-07-30 ENCOUNTER — Encounter: Payer: Self-pay | Admitting: Family Medicine

## 2012-07-30 VITALS — BP 137/77 | HR 73 | Ht 63.0 in | Wt 163.0 lb

## 2012-07-30 DIAGNOSIS — R1030 Lower abdominal pain, unspecified: Secondary | ICD-10-CM

## 2012-07-30 DIAGNOSIS — R109 Unspecified abdominal pain: Secondary | ICD-10-CM

## 2012-07-30 DIAGNOSIS — E119 Type 2 diabetes mellitus without complications: Secondary | ICD-10-CM

## 2012-07-30 DIAGNOSIS — Z23 Encounter for immunization: Secondary | ICD-10-CM | POA: Diagnosis not present

## 2012-07-30 LAB — CBC
HCT: 35.4 % — ABNORMAL LOW (ref 36.0–46.0)
Hemoglobin: 11.9 g/dL — ABNORMAL LOW (ref 12.0–15.0)
MCV: 91.5 fL (ref 78.0–100.0)
Platelets: 222 10*3/uL (ref 150–400)
RBC: 3.87 MIL/uL (ref 3.87–5.11)
WBC: 3.1 10*3/uL — ABNORMAL LOW (ref 4.0–10.5)

## 2012-07-30 LAB — COMPREHENSIVE METABOLIC PANEL
BUN: 13 mg/dL (ref 6–23)
CO2: 31 mEq/L (ref 19–32)
Creat: 0.79 mg/dL (ref 0.50–1.10)
Glucose, Bld: 132 mg/dL — ABNORMAL HIGH (ref 70–99)
Total Bilirubin: 0.6 mg/dL (ref 0.3–1.2)

## 2012-07-30 LAB — LIPID PANEL
HDL: 101 mg/dL (ref >39)
Total CHOL/HDL Ratio: 2.5 Ratio

## 2012-07-30 MED ORDER — INSULIN ASPART 100 UNIT/ML ~~LOC~~ SOLN: 2.0000 [IU] | Freq: Every day | SUBCUTANEOUS | Status: DC

## 2012-07-30 NOTE — Patient Instructions (Addendum)
Thank you for coming in today, it was good to see you You should still be on novolog to use before supper, start using 2 units I think the pain in your groin may be from scar tissue.  If you develop a bulge or change in bowels please return Follow up with me in one month, bring your meter.

## 2012-08-06 ENCOUNTER — Other Ambulatory Visit: Payer: Self-pay | Admitting: *Deleted

## 2012-08-06 DIAGNOSIS — R1030 Lower abdominal pain, unspecified: Secondary | ICD-10-CM | POA: Insufficient documentation

## 2012-08-06 NOTE — Assessment & Plan Note (Addendum)
Fairly well controlled, still with some post prandial highs.  Only using lantus at this time.  Stopped novolog.  She stopped starlix because she felt it was making her sick.  Continue lantus and novolog for now.

## 2012-08-06 NOTE — Progress Notes (Signed)
  Subjective:    Patient ID: Hayley Fisher, female    DOB: [birth year], 78 y.o.   MRN: [id number]  HPI 1. CHRONIC DIABETES  Disease Monitoring  Blood Sugar Ranges: 80-90, fasting; 150-250 post prandial  Polyuria: no   Visual problems: No   Medication Compliance: yes  Medication Side Effects  Hypoglycemia: no   Preventitive Health Care  Eye Exam: Last week, being referred to a retina specialist for her L eye  Foot Exam: Today  Diet pattern: Generally fairly healthy, good balance  Exercise: limited    2. Groin Pain:  Pain in groin for a few months off and on.  Over site of previous inguinal hernia repair.  Has not noticed any bulge.  Bowels are moving normally and without blood.  Denies nausea or vomiting, fever.    Review of Systems Per HPI    Objective:   Physical Exam  Constitutional: She appears well-nourished. No distress.  HENT:  Head: Normocephalic.  Cardiovascular: Normal rate and regular rhythm.   Pulmonary/Chest: Effort normal and breath sounds normal.  Genitourinary:       No bulge or pain illicited over inguinal region.   Musculoskeletal: She exhibits no edema.          Assessment & Plan:

## 2012-08-06 NOTE — Assessment & Plan Note (Signed)
No mass, bulge or tenderness on exam.  Possibly scar tissue from previous surgery.  No signs of hernia recurence at this time.

## 2012-08-07 MED ORDER — "INSULIN SYRINGE-NEEDLE U-100 31G X 5/16"" 0.3 ML MISC": Status: DC

## 2012-08-07 MED ORDER — ENALAPRIL MALEATE 20 MG PO TABS: 20.0000 mg | ORAL_TABLET | Freq: Every day | ORAL | Status: DC

## 2012-08-13 DIAGNOSIS — H4011X0 Primary open-angle glaucoma, stage unspecified: Secondary | ICD-10-CM | POA: Diagnosis not present

## 2012-08-13 DIAGNOSIS — E11311 Type 2 diabetes mellitus with unspecified diabetic retinopathy with macular edema: Secondary | ICD-10-CM | POA: Diagnosis not present

## 2012-08-13 DIAGNOSIS — H26499 Other secondary cataract, unspecified eye: Secondary | ICD-10-CM | POA: Diagnosis not present

## 2012-09-10 DIAGNOSIS — E11311 Type 2 diabetes mellitus with unspecified diabetic retinopathy with macular edema: Secondary | ICD-10-CM | POA: Diagnosis not present

## 2012-10-10 DIAGNOSIS — R110 Nausea: Secondary | ICD-10-CM | POA: Diagnosis not present

## 2012-10-10 DIAGNOSIS — R42 Dizziness and giddiness: Secondary | ICD-10-CM | POA: Diagnosis not present

## 2012-10-10 DIAGNOSIS — R404 Transient alteration of awareness: Secondary | ICD-10-CM | POA: Diagnosis not present

## 2012-10-26 ENCOUNTER — Ambulatory Visit: Payer: Medicare Other | Admitting: Family Medicine

## 2012-10-29 ENCOUNTER — Encounter: Payer: Self-pay | Admitting: Family Medicine

## 2012-10-29 ENCOUNTER — Ambulatory Visit (INDEPENDENT_AMBULATORY_CARE_PROVIDER_SITE_OTHER): Payer: Medicare Other | Admitting: Family Medicine

## 2012-10-29 VITALS — BP 144/78 | HR 88 | Temp 97.6°F | Ht 63.0 in | Wt 161.7 lb

## 2012-10-29 DIAGNOSIS — I10 Essential (primary) hypertension: Secondary | ICD-10-CM

## 2012-10-29 DIAGNOSIS — N390 Urinary tract infection, site not specified: Secondary | ICD-10-CM | POA: Diagnosis not present

## 2012-10-29 DIAGNOSIS — R300 Dysuria: Secondary | ICD-10-CM | POA: Diagnosis not present

## 2012-10-29 LAB — POCT UA - MICROSCOPIC ONLY

## 2012-10-29 LAB — POCT URINALYSIS DIPSTICK
Ketones, UA: NEGATIVE
Protein, UA: NEGATIVE
pH, UA: 6.5

## 2012-10-29 MED ORDER — CEPHALEXIN 500 MG PO CAPS: 500.0000 mg | ORAL_CAPSULE | Freq: Four times a day (QID) | ORAL | Status: DC

## 2012-10-29 NOTE — Assessment & Plan Note (Signed)
Treatment with Keflex x 7 days. Treat based on symptoms and U/A. Pyridium prn. Call or return to clinic prn if these symptoms worsen or fail to improve as anticipated, or if she begins to have any back pain, fevers, or chills.

## 2012-10-29 NOTE — Addendum Note (Signed)
Addended by: [place], Lizmary Nader on: 10/29/2012 12:19 PM   Modules accepted: Orders

## 2012-10-29 NOTE — Progress Notes (Signed)
  Subjective:    Patient ID: Hayley Fisher, female    DOB: Dec 17, 1930, 79 y.o.   MRN: [id number]  HPI  HPI 1.  Concern for UTI: Endorses history of hesitancy, urgency, incomplete voiding for 14 days.  Also with suprapubic pressure.  No history of incontinence.  No abdominal pain, nausea, vomiting, back pain, fevers, or chills.  No vaginal discharge, burning, or itching.   Review of Systems See HPI above for review of systems.       Objective:   Physical Exam Gen:  Alert, cooperative patient who appears stated age in no acute distress.  Vital signs reviewed. Mouth: MMM Cardiac:  Regular rate and rhythm without murmur auscultated.  Good S1/S2. Pulm:  Clear to auscultation bilaterally with good air movement.  No wheezes or rales noted.   Abdomen:  Soft/ND/NT.  No CVA tenderness BL       Review of Systems     Objective:   Physical Exam        Assessment & Plan:

## 2012-10-29 NOTE — Patient Instructions (Signed)
It does indeed look like you have a urinary tract infection.  Take the Keflex twice daily for the next 7 days.   We will send this for culture to make sure the antibiotics are the right ones.    Come back in about a month to make sure you no longer have sugar in your urine.  It was good to see you today!

## 2012-10-29 NOTE — Assessment & Plan Note (Signed)
Not at goal for diabetic today. However she has not taken her BP meds. Instructed to take when she returns home.   FU in 2-3 weeks for BP recheck

## 2012-10-31 ENCOUNTER — Telehealth: Payer: Self-pay | Admitting: Family Medicine

## 2012-10-31 MED ORDER — NITROFURANTOIN MONOHYD MACRO 100 MG PO CAPS: 100.0000 mg | ORAL_CAPSULE | Freq: Two times a day (BID) | ORAL | Status: DC

## 2012-10-31 NOTE — Telephone Encounter (Signed)
Called and discussed enteroccocus UTI.  Will switch to Macrobid as susceptibilities are still pending.  Patient has experienced some improvement with symptoms.  No fevers/chills/back pain.  I have sent in new prescription.

## 2012-11-13 ENCOUNTER — Ambulatory Visit (INDEPENDENT_AMBULATORY_CARE_PROVIDER_SITE_OTHER): Payer: Medicare Other | Admitting: Family Medicine

## 2012-11-13 ENCOUNTER — Encounter: Payer: Self-pay | Admitting: Family Medicine

## 2012-11-13 VITALS — BP 132/74 | HR 106 | Temp 98.1°F | Ht 63.0 in | Wt 162.4 lb

## 2012-11-13 DIAGNOSIS — N390 Urinary tract infection, site not specified: Secondary | ICD-10-CM | POA: Diagnosis not present

## 2012-11-13 DIAGNOSIS — R350 Frequency of micturition: Secondary | ICD-10-CM | POA: Diagnosis not present

## 2012-11-13 DIAGNOSIS — N760 Acute vaginitis: Secondary | ICD-10-CM

## 2012-11-13 LAB — POCT WET PREP (WET MOUNT)

## 2012-11-13 LAB — POCT URINALYSIS DIPSTICK
Glucose, UA: 100
Nitrite, UA: NEGATIVE
Protein, UA: NEGATIVE
Spec Grav, UA: 1.015
Urobilinogen, UA: 1

## 2012-11-13 LAB — POCT UA - MICROSCOPIC ONLY

## 2012-11-13 MED ORDER — LEVOFLOXACIN 250 MG PO TABS: 250.0000 mg | ORAL_TABLET | Freq: Every day | ORAL | Status: DC

## 2012-11-13 NOTE — Progress Notes (Signed)
  Subjective:    Patient ID: Hayley Fisher, female    DOB: 1931-02-20, 79 y.o.   MRN: [id number]  HPI   Here for urinary frequency, lower pelvic pain  Was seen on 3/10 for 14 days or urinary frequency, hesitancy, pelvic pressure.  Was started on Keflex which was changed to nitrofurantoin for culture showing enterococcus.  Patient states symptoms completely resolved for 3-4 days, then about 2 days ago started having itching, burning with urination, pelvic pressure, and frequent urination.  I have reviewed patient's  PMH, FH, and Social history and Medications as related to this visit. History of DM (reports cbg's 180's, is close to baseline), no history of bladder instrumentation recently.  Not sexually active.  Denies vaginal discharge Review of Systems See HPI    Objective:   Physical Exam  GEN: Alert & Oriented, No acute distress Abd:  + BS, soft, no tenderness to palpation GYN: no vestibular pain on palpation, no pain in palpation of urthera.  No vaginal discharge.       Assessment & Plan:

## 2012-11-13 NOTE — Patient Instructions (Signed)
Urinary Tract Infection  A urinary tract infection (UTI) is often caused by a germ (bacteria). A UTI is usually helped with medicine (antibiotics) that kills germs. Take all the medicine until it is gone. Do this even if you are feeling better. You are usually better in 7 to 10 days.  HOME CARE    Drink enough water and fluids to keep your pee (urine) clear or pale yellow. Drink:   Cranberry juice.   Water.   Avoid:   Caffeine.   Tea.   Bubbly (carbonated) drinks.   Alcohol.   Only take medicine as told by your doctor.   To prevent further infections:   Pee often.   After pooping (bowel movement), women should wipe from front to back. Use each tissue only once.   Pee before and after having sex (intercourse).  Ask your doctor when your test results will be ready. Make sure you follow up and get your test results.   GET HELP RIGHT AWAY IF:    There is very bad back pain or lower belly (abdominal) pain.   You get the chills.   You have a fever.   Your baby is older than 3 months with a rectal temperature of 102 F (38.9 C) or higher.   Your baby is 3 months old or younger with a rectal temperature of 100.4 F (38 C) or higher.   You feel sick to your stomach (nauseous) or throw up (vomit).   There is continued burning with peeing.   Your problems are not better in 3 days. Return sooner if you are getting worse.  MAKE SURE YOU:    Understand these instructions.   Will watch your condition.   Will get help right away if you are not doing well or get worse.  Document Released: 01/25/2008 Document Revised: 10/31/2011 Document Reviewed: 01/25/2008  [employer] Patient Information 2013 [employer].

## 2012-11-13 NOTE — Assessment & Plan Note (Signed)
Seems to be recurrent uti.  Pos leuk est today.  Will terat with 3 day course of levaquin, culture pending.

## 2012-11-14 LAB — URINE CULTURE
Colony Count: NO GROWTH
Organism ID, Bacteria: NO GROWTH

## 2012-12-21 ENCOUNTER — Encounter: Payer: Self-pay | Admitting: Family Medicine

## 2012-12-21 ENCOUNTER — Ambulatory Visit (INDEPENDENT_AMBULATORY_CARE_PROVIDER_SITE_OTHER): Payer: Medicare Other | Admitting: Family Medicine

## 2012-12-21 VITALS — BP 155/85 | HR 89 | Ht 63.0 in | Wt 163.1 lb

## 2012-12-21 DIAGNOSIS — E119 Type 2 diabetes mellitus without complications: Secondary | ICD-10-CM

## 2012-12-21 DIAGNOSIS — R198 Other specified symptoms and signs involving the digestive system and abdomen: Secondary | ICD-10-CM | POA: Insufficient documentation

## 2012-12-21 DIAGNOSIS — I10 Essential (primary) hypertension: Secondary | ICD-10-CM

## 2012-12-21 NOTE — Assessment & Plan Note (Signed)
Manual recheck 138/85.  No changes to medications at this time.

## 2012-12-21 NOTE — Patient Instructions (Addendum)
Thank you for coming in today, it was good to see you Please record your sugars after meals and follow up in one month Schedule a colonoscopy, if for some reason you need a referral let us know.

## 2012-12-21 NOTE — Progress Notes (Signed)
  Subjective:    Patient ID: Hayley Fisher, female    DOB: 1931/05/29, 79 y.o.   MRN: [id number]  Diabetes   1. DM: CHRONIC DIABETES  Disease Monitoring  Blood Sugar Ranges: Fasting blood glucose 70-130 typically, Has had a couple over 200.  Is not checking post prandial glucose.   Polyuria: no   Visual problems: no   Medication Compliance: yes  Medication Side Effects  Hypoglycemia: no   Preventitive Health Care  Eye Exam: Will schedule soon  Foot Exam: Done today  Diet pattern: Generally healthy, but does tend to eat some sweets and high carb foods  Exercise: Walking occasionally  2. Bowel problem:  Reports feeling of incomplete emptying after bowel movement.  BM are somewhat hard.  Denies any pain with BM.  Has not noticed any bleeding.  Has had colonoscopy in the past but believes >10 years ago.  Denies nausea, abdominal pain, weight loss, diarrhea.     Review of Systems Per HPI    Objective:   Physical Exam  Constitutional: She is oriented to person, place, and time. She appears well-nourished. No distress.  HENT:  Head: Normocephalic and atraumatic.  Neck: Neck supple.  Cardiovascular: Normal rate and regular rhythm.   Pulmonary/Chest: Effort normal and breath sounds normal.  Abdominal: Soft. Bowel sounds are normal. She exhibits no distension. There is no tenderness.  Musculoskeletal: She exhibits no edema.  Neurological: She is alert and oriented to person, place, and time.          Assessment & Plan:

## 2012-12-21 NOTE — Assessment & Plan Note (Signed)
Not well controlled.  Is only using novolog intermittently.  Fasting CBG's sound like they are ok.  Will have her record post prandial, follow up in one month and adjust novolog as needed.  Encouraged walking more.

## 2012-12-21 NOTE — Assessment & Plan Note (Signed)
Feeling of incomplete evacuation of stool.  Declines rectal exam, given information on colon cancer screening instructed to schedule, appears to be overdue for follow up screening.

## 2013-01-03 DIAGNOSIS — E11311 Type 2 diabetes mellitus with unspecified diabetic retinopathy with macular edema: Secondary | ICD-10-CM | POA: Diagnosis not present

## 2013-01-03 DIAGNOSIS — H26499 Other secondary cataract, unspecified eye: Secondary | ICD-10-CM | POA: Diagnosis not present

## 2013-01-03 DIAGNOSIS — H4011X0 Primary open-angle glaucoma, stage unspecified: Secondary | ICD-10-CM | POA: Diagnosis not present

## 2013-01-06 ENCOUNTER — Emergency Department (HOSPITAL_COMMUNITY)
Admission: EM | Admit: 2013-01-06 | Discharge: 2013-01-06 | Disposition: A | Discharge status: Home / Self Care | Payer: Medicare Other | Attending: Emergency Medicine | Admitting: Emergency Medicine

## 2013-01-06 ENCOUNTER — Encounter (HOSPITAL_COMMUNITY): Payer: Self-pay | Admitting: *Deleted

## 2013-01-06 DIAGNOSIS — N390 Urinary tract infection, site not specified: Secondary | ICD-10-CM | POA: Insufficient documentation

## 2013-01-06 DIAGNOSIS — R109 Unspecified abdominal pain: Secondary | ICD-10-CM | POA: Diagnosis not present

## 2013-01-06 DIAGNOSIS — R110 Nausea: Secondary | ICD-10-CM | POA: Diagnosis not present

## 2013-01-06 DIAGNOSIS — Z79899 Other long term (current) drug therapy: Secondary | ICD-10-CM | POA: Diagnosis not present

## 2013-01-06 DIAGNOSIS — E119 Type 2 diabetes mellitus without complications: Secondary | ICD-10-CM | POA: Insufficient documentation

## 2013-01-06 DIAGNOSIS — Z862 Personal history of diseases of the blood and blood-forming organs and certain disorders involving the immune mechanism: Secondary | ICD-10-CM | POA: Insufficient documentation

## 2013-01-06 DIAGNOSIS — R319 Hematuria, unspecified: Secondary | ICD-10-CM | POA: Diagnosis not present

## 2013-01-06 DIAGNOSIS — I10 Essential (primary) hypertension: Secondary | ICD-10-CM | POA: Insufficient documentation

## 2013-01-06 DIAGNOSIS — Z87891 Personal history of nicotine dependence: Secondary | ICD-10-CM | POA: Insufficient documentation

## 2013-01-06 DIAGNOSIS — Z7982 Long term (current) use of aspirin: Secondary | ICD-10-CM | POA: Diagnosis not present

## 2013-01-06 DIAGNOSIS — I440 Atrioventricular block, first degree: Secondary | ICD-10-CM | POA: Diagnosis not present

## 2013-01-06 DIAGNOSIS — Z8639 Personal history of other endocrine, nutritional and metabolic disease: Secondary | ICD-10-CM | POA: Insufficient documentation

## 2013-01-06 DIAGNOSIS — R42 Dizziness and giddiness: Secondary | ICD-10-CM | POA: Diagnosis not present

## 2013-01-06 DIAGNOSIS — M129 Arthropathy, unspecified: Secondary | ICD-10-CM | POA: Insufficient documentation

## 2013-01-06 DIAGNOSIS — Z794 Long term (current) use of insulin: Secondary | ICD-10-CM | POA: Diagnosis not present

## 2013-01-06 LAB — URINALYSIS, ROUTINE W REFLEX MICROSCOPIC
Bilirubin Urine: NEGATIVE
Glucose, UA: >1000 mg/dL — AB
Hgb urine dipstick: NEGATIVE
Ketones, ur: NEGATIVE mg/dL
Protein, ur: NEGATIVE mg/dL
Urobilinogen, UA: 1 mg/dL (ref 0.0–1.0)

## 2013-01-06 LAB — URINE MICROSCOPIC-ADD ON

## 2013-01-06 MED ORDER — NITROFURANTOIN MONOHYD MACRO 100 MG PO CAPS: 100.0000 mg | ORAL_CAPSULE | Freq: Two times a day (BID) | ORAL | Status: DC

## 2013-01-06 MED ORDER — NITROFURANTOIN MONOHYD MACRO 100 MG PO CAPS
100.0000 mg | ORAL_CAPSULE | Freq: Once | ORAL | Status: AC
  Administered 2013-01-06: 100 mg via ORAL
  Filled 2013-01-06: qty 1

## 2013-01-06 MED ORDER — ONDANSETRON 4 MG PO TBDP
4.0000 mg | ORAL_TABLET | Freq: Once | ORAL | Status: AC
  Administered 2013-01-06: 4 mg via ORAL
  Filled 2013-01-06: qty 1

## 2013-01-06 NOTE — ED Notes (Signed)
PT c/o sudden onset of hematuria tonight. Hx UTI, last treated in April for one. Pt c/o nausea starting at same time. Pt denies dysuria at this time. Pt c/o suprapubic pain starting x 2-3 weeks.

## 2013-01-06 NOTE — ED Notes (Signed)
Discharge instructions reviewed w/ pt., verbalizes understanding. One prescription provided at discharge. 

## 2013-01-06 NOTE — ED Provider Notes (Addendum)
History     CSN: [id number]  Arrival date & time 01/06/13  0258   First MD Initiated Contact with Patient 01/06/13 [phone]      Chief Complaint  Patient presents with  . Hematuria   HPI Hayley Fisher is a 79 y.o. female arrives complaining of hematuria, started tonight. Patient has a history of UTI and was treated in April. Patient also had some associated nausea that started at the same time. She's also complaining about intermittent sharp suprapubic pain is been present for about 2-3 weeks. No flank pain, no fever, no chills, chest pain or shortness of breath.  The hematuria occurred once. Patient does have a pertinent surgical history of prior bladder tacking and a bladder suspension. Patient says that she has a urinary tract infection. On review of systems patient, mentions she's had some dizziness on changing position the   Past Medical History  Diagnosis Date  . Hypertension   . Hyperlipidemia   . Diabetes mellitus   . Arthritis     Past Surgical History  Procedure Laterality Date  . Bladder tacking    . Bladder suspension      History reviewed. No pertinent family history.  History  Substance Use Topics  . Smoking status: Former Smoker    Quit date: 11/29/1980  . Smokeless tobacco: Never Used  . Alcohol Use: No    OB History   Grav Para Term Preterm Abortions TAB SAB Ect Mult Living                  Review of Systems At least 10pt or greater review of systems completed and are negative except where specified in the HPI.  Allergies  Levemir  Home Medications   Current Outpatient Rx  Name  Route  Sig  Dispense  Refill  . aspirin 81 MG chewable tablet   Oral   Chew 81 mg by mouth every morning.          . brimonidine (ALPHAGAN P) 0.1 % SOLN   Both Eyes   Place 1 drop into both eyes 2 (two) times daily.         Marland Kitchen docusate sodium (COLACE) 100 MG capsule   Oral   Take 100 mg by mouth 2 (two) times daily as needed for constipation.         .  enalapril (VASOTEC) 20 MG tablet   Oral   Take 20 mg by mouth every morning.         . insulin glargine (LANTUS) 100 UNIT/ML injection   Subcutaneous   Inject 8 Units into the skin every morning.   10 mL   6   . latanoprost (XALATAN) 0.005 % ophthalmic solution   Left Eye   Place 1 drop into the left eye at bedtime.          . Multiple Vitamins-Minerals (PRESERVISION AREDS PO)   Oral   Take 1 tablet by mouth every morning.            BP 108/64  Pulse 80  Temp(Src) 97.9 F (36.6 C) (Oral)  Resp 16  Wt 163 lb (73.936 kg)  BMI 28.88 kg/m2  SpO2 100%  Physical Exam  Nursing notes reviewed.  Electronic medical record reviewed. VITAL SIGNS:   Filed Vitals:   01/06/13 0717 01/06/13 0718 01/06/13 0719 01/06/13 0720  BP:      Pulse: 78 79 86 85  Temp:      TempSrc:  Resp:      Weight:      SpO2: 100% 100% 100% 99%   CONSTITUTIONAL: Awake, oriented, appears non-toxic HENT: Atraumatic, normocephalic, oral mucosa pink and moist, airway patent. Nares patent without drainage. External ears normal. EYES: Conjunctiva clear, EOMI, PERRLA NECK: Trachea midline, non-tender, supple CARDIOVASCULAR: Normal heart rate, Normal rhythm, No murmurs, rubs, gallops PULMONARY/CHEST: Clear to auscultation, no rhonchi, wheezes, or rales. Symmetrical breath sounds. Non-tender. ABDOMINAL: Non-distended, soft, non-tender - no rebound or guarding.  BS normal. NEUROLOGIC: Non-focal, moving all four extremities, no gross sensory or motor deficits. EXTREMITIES: No clubbing, cyanosis, or edema SKIN: Warm, Dry, No erythema, No rash  ED Course  Procedures (including critical care time)  Date: 01/06/2013  Rate: 75  Rhythm: normal sinus rhythm  QRS Axis: normal  Intervals: First degree AV block  ST/T Wave abnormalities: normal  Conduction Disutrbances: none  Narrative Interpretation: First degree AV block is new from prior EKG dated 11/16/2011, nonischemic EKG  Labs Reviewed   URINALYSIS, ROUTINE W REFLEX MICROSCOPIC - Abnormal; Notable for the following:    Glucose, UA >1000 (*)    Leukocytes, UA MODERATE (*)    All other components within normal limits  URINE CULTURE  URINE MICROSCOPIC-ADD ON   No results found.   1. UTI (lower urinary tract infection)       MDM  Patient afebrile, nontoxic, she's not septic appearing, she does have a moderate amount of leukocyte Estrace as well as 36 white cells per high-power field her urine, negative for nitrites however prior urine cultures grew enterococcus susceptible to Macrobid. Place the patient on Macrobid, do not think she needs bactericidal antibiotic at this time. Patient was able to get up walk to the bathroom by herself she was not dizzy, had a normal gait. Patient was not orthostatic when tested by nursing staff. Patient discharged home stable and good condition, her questions have been answered, she accepts and understands medical plan been dictated.        Jones Skene, MD 01/06/13 9604  Jones Skene, MD 01/06/13 1042

## 2013-01-07 LAB — URINE CULTURE: Colony Count: 30000

## 2013-01-31 ENCOUNTER — Encounter: Payer: Medicare Other | Admitting: Family Medicine

## 2013-01-31 ENCOUNTER — Ambulatory Visit (INDEPENDENT_AMBULATORY_CARE_PROVIDER_SITE_OTHER): Payer: Medicare Other | Admitting: Family Medicine

## 2013-01-31 DIAGNOSIS — R051 Acute cough: Secondary | ICD-10-CM | POA: Insufficient documentation

## 2013-01-31 DIAGNOSIS — R05 Cough: Secondary | ICD-10-CM

## 2013-01-31 DIAGNOSIS — R059 Cough, unspecified: Secondary | ICD-10-CM | POA: Diagnosis not present

## 2013-01-31 MED ORDER — HYDROCOD POLST-CHLORPHEN POLST 10-8 MG/5ML PO LQCR: 5.0000 mL | Freq: Every evening | ORAL | Status: DC | PRN

## 2013-01-31 NOTE — Progress Notes (Signed)
  Subjective:    Patient ID: Hayley Fisher, female    DOB: Aug 09, 1931, 79 y.o.   MRN: [id number]  HPI 1. Cough:  C/o cough for the past 7 days.  Cough is productive of yellow sputum. Does endorse some mild nasal congestion.  Denies sick contacts.  She denies any fevers, chills, shortness of breath, wheezing, chest pain, nausea or vomiting, swelling or legs, reflux symptoms.  Her cough is keeping her awake at night.  She has no history of CHF and is a former smoker and quit in 1982.     Review of Systems Per HPI    Objective:   Physical Exam  Constitutional: No distress.  Pleasant, well nourished.  HENT:  Mouth/Throat: Oropharynx is clear and moist. No oropharyngeal exudate.  Neck: Neck supple. No JVD present.  Cardiovascular: Normal rate, regular rhythm and normal heart sounds.   Pulmonary/Chest: Effort normal and breath sounds normal. No respiratory distress. She has no wheezes.  Musculoskeletal: She exhibits no edema and no tenderness (No calf tenderness or swelling. ).  Lymphadenopathy:    She has no cervical adenopathy.  Neurological: She is alert.          Assessment & Plan:

## 2013-01-31 NOTE — Patient Instructions (Addendum)
Thank you for coming in today, it was good to see you Be sure you are getting plenty of fluids to help with coughing up phlegm If you are not improving with 7-10 days please return.

## 2013-01-31 NOTE — Assessment & Plan Note (Addendum)
Likely related to URI.  Lung exam clear without wheezing or diminished air sounds and afebrile, doubt PNA.  Given rx for tussionex to use at night for cough.  Instructed to push fluids, follow up if not improving or for worsening symptoms.

## 2013-02-01 NOTE — Progress Notes (Signed)
Patient ID: Hayley Fisher, female   DOB: 1931/05/07, 79 y.o.   MRN: [id number]

## 2013-02-06 ENCOUNTER — Ambulatory Visit: Payer: Medicare Other | Admitting: Family Medicine

## 2013-02-27 ENCOUNTER — Telehealth: Payer: Self-pay | Admitting: Family Medicine

## 2013-02-27 NOTE — Telephone Encounter (Signed)
Pt is having trouble getting diabetic supplies from [employer] (mail order)  [phone]. The company is saying is not authorized for [employer] and Medicare. Please advise

## 2013-02-28 ENCOUNTER — Other Ambulatory Visit: Payer: Self-pay

## 2013-02-28 NOTE — Telephone Encounter (Signed)
Called pt. Advised to call her mail order and have them fax the request to Attn: Lupita Leash. She will have one of our Faculty doctors sign the request, so she can receive her supplies. Pt agreed. Lorenda Hatchet, Renato Battles

## 2013-03-04 ENCOUNTER — Other Ambulatory Visit: Payer: Self-pay | Admitting: Family Medicine

## 2013-03-05 ENCOUNTER — Other Ambulatory Visit: Payer: Self-pay | Admitting: Family Medicine

## 2013-03-06 NOTE — Telephone Encounter (Signed)
I believe that I have already re-filled the rx for Vasotec on 03/04/13. Would you be able to confirm this? It looks like it was sent.  Thank you. Marko Stai

## 2013-04-01 ENCOUNTER — Other Ambulatory Visit: Payer: Self-pay

## 2013-04-01 DIAGNOSIS — Z1231 Encounter for screening mammogram for malignant neoplasm of breast: Secondary | ICD-10-CM

## 2013-04-15 ENCOUNTER — Ambulatory Visit
Admission: RE | Admit: 2013-04-15 | Discharge: 2013-04-15 | Disposition: A | Discharge status: Home / Self Care | Payer: Medicare Other | Source: Ambulatory Visit

## 2013-04-15 DIAGNOSIS — Z1231 Encounter for screening mammogram for malignant neoplasm of breast: Secondary | ICD-10-CM | POA: Diagnosis not present

## 2013-04-18 DIAGNOSIS — H4011X0 Primary open-angle glaucoma, stage unspecified: Secondary | ICD-10-CM | POA: Diagnosis not present

## 2013-04-18 DIAGNOSIS — H26499 Other secondary cataract, unspecified eye: Secondary | ICD-10-CM | POA: Diagnosis not present

## 2013-04-18 DIAGNOSIS — E11311 Type 2 diabetes mellitus with unspecified diabetic retinopathy with macular edema: Secondary | ICD-10-CM | POA: Diagnosis not present

## 2013-05-01 ENCOUNTER — Ambulatory Visit (INDEPENDENT_AMBULATORY_CARE_PROVIDER_SITE_OTHER): Payer: Medicare Other | Admitting: Family Medicine

## 2013-05-01 ENCOUNTER — Encounter: Payer: Self-pay | Admitting: Family Medicine

## 2013-05-01 VITALS — BP 156/58 | HR 83 | Temp 97.9°F | Ht 63.0 in | Wt 161.2 lb

## 2013-05-01 DIAGNOSIS — E119 Type 2 diabetes mellitus without complications: Secondary | ICD-10-CM | POA: Diagnosis not present

## 2013-05-01 DIAGNOSIS — I10 Essential (primary) hypertension: Secondary | ICD-10-CM | POA: Diagnosis not present

## 2013-05-01 MED ORDER — ENALAPRIL MALEATE 20 MG PO TABS: ORAL_TABLET | ORAL | Status: DC

## 2013-05-01 NOTE — Assessment & Plan Note (Addendum)
Improved DM control. Did not bring glucometer today, but able to report range of readings. Current HgbA1c 7.4 (05/01/13), last HgbA1c 9.0 (12/21/12). CBGs: Low 70, High 200, Avg 100 - Checks CBG 2-3x daily (fasting AM, pre-prandial, PM) Meds: Lantus 8u daily in AM. (Not taking any additional Novolog insulin) - currently on ACEi. Reports compliance. Denies hypoglycemia. Complications: +peripheral neuropathy b/l LE HM: Eye Exam (q 6 months) - last 04/17/13, Foot Exam (05/01/13) Lifestyle: Significant modifications - dec meal size, inc frequency of small meals, dec carbs / sweets. Exercise regimen inc daily aerobic / weights following TV exer program.  Plan: 1. Significantly improved control. Continue current therapy, Lantus 8u daily 2. Advised patient to bring in glucometer or readings to next visit 3. Continue lifestyle modifications. 4. Discussed foot hygiene, examine for ulcers or wounds. Future Consideration: Risk factor modification - discussed plan to potentially resume statin therapy. Pt previously took Zocor, but stopped taking on own. Calculated 34yr ASCVD risk recommends moderate-high intensity statin. Patient will consider, discuss at next appointment.

## 2013-05-01 NOTE — Progress Notes (Signed)
Subjective:     Patient ID: Hayley Fisher, female   DOB: 06-28-1931, 79 y.o.   MRN: [id number]  HPI  CHRONIC DIABETES MELLITUS: Improved DM control. Did not bring glucometer today, but able to report range of readings.  Current HgbA1c 7.4 (05/01/13), last HgbA1c 9.0 (12/21/12).  CBGs: Low 70, High 200, Avg 100 - Checks CBG 2-3x daily (fasting AM, pre-prandial, PM)  Meds: Lantus 8u daily in AM. (Not taking any additional Novolog insulin) - currently on ACEi. Reports compliance.  Denies hypoglycemia, urinary frequency. Complications: +peripheral neuropathy b/l LE  HM: Eye Exam (q 6 months) - last 04/17/13, Foot Exam (05/01/13)  Lifestyle: Significant modifications - dec meal size, inc frequency of small meals, dec carbs / sweets. Exercise regimen inc daily aerobic / weights following TV exer program  CHRONIC HYPERTENSION: Systolic BP initially elevated today 156/58. Improved on re-check at 131/63.  Normally compliant with Enalapril 20mg  daily (although reports did not take today). Tolerating well, without complaints.  Denies HA, CP, edema, dizziness / lightheadedness  Social hx: Former smoker (quit 1982) Past Medical Hx: Glaucoma L > R, follows Ophthalmology every 6 months. Health maintenance: Eye exams every 6 months. Immunizations: needs flu shot today  Review of Systems  See above HPI.     Objective:   Physical Exam  BP 156/58  Pulse 83  Temp(Src) 97.9 F (36.6 C) (Oral)  Ht 5\' 3"  (1.6 m)  Wt 161 lb 3.2 oz (73.12 kg)  BMI 28.56 kg/m2  General - very pleasant, conversational, WDWN, NAD HEENT: PERRLA, EOMI Neck - soft, non-tender, no LAD Heart - RRR, no murmurs Lungs - CTAB Abd - soft, non-tender, non-distended, +BS Ext - no edema, non-tender, +2 peripheral pulses Neuro - awake, alert, oriented. Muscle str 4/5 bilateral extremities. Grossly non-focal. DM Foot Exam: +decreased sensation lateral aspect bilateral feet. No deformities, ulcers, or lesions noted.

## 2013-05-01 NOTE — Patient Instructions (Addendum)
Dear Hayley Fisher, Thank you for coming in to clinic today. HAPPY BIRTHDAY! And it was good to meet you!  Today we discussed your Diabetes, blood pressure, constipation. 1. Congratulations! You are doing a good job controlling your diabetes. Keep up the good work with your improved diet and exercise. 2. Please make sure to take your blood pressure meds daily. Also, try to check your BP at home or at drug store occasionally 3. For your constipation, I can resume your Colace. If you have any problems, please call back and I can send in Miralax prescription. Otherwise, increase fiber in your diet and stay hydrated to help. 4. We also discussed possibility of starting a cholesterol medication. We can discuss this more at your next visit.  Some important numbers from today's visit: HgA1C - 7.4 (last one was 9.0) BP - 156/58, re-checked at 131/63  Please schedule a follow-up appointment with 3 months for a DM follow-up.  If you have any other questions or concerns, please feel free to call the clinic to contact me. You may also schedule an earlier appointment if necessary.  However, if your symptoms get significantly worse, please go to the Emergency Department to seek immediate medical attention.  Saralyn Pilar, DO [facility]

## 2013-05-01 NOTE — Assessment & Plan Note (Addendum)
Systolic BP initially elevated today 156/58. Improved on re-check at 131/63. Normally compliant with Enalapril 20mg  daily (although reports did not take today). Tolerating well, without complaints. Denies HA, CP, edema, dizziness / lightheadedness.  Plan: 1. Continue Enalapril 20mg  daily - sent in refill. No change at this time. 2. Decrease salt intake, increase potassium rich foods (vegetables) 3. Continue exercise regimen 4. Check BP at home or at drug store occasionally

## 2013-05-17 ENCOUNTER — Other Ambulatory Visit: Payer: Self-pay | Admitting: Family Medicine

## 2013-05-17 DIAGNOSIS — E119 Type 2 diabetes mellitus without complications: Secondary | ICD-10-CM

## 2013-05-17 MED ORDER — INSULIN GLARGINE 100 UNIT/ML ~~LOC~~ SOLN
8.0000 [IU] | SUBCUTANEOUS | Status: DC
Start: 2013-05-17 — End: 2013-11-25

## 2013-07-25 ENCOUNTER — Other Ambulatory Visit: Payer: Self-pay | Admitting: Family Medicine

## 2013-07-25 NOTE — Telephone Encounter (Signed)
Will forward Rx refill request to PCP.  Chianti Goh, Darlyne Russian, CMA

## 2013-09-03 ENCOUNTER — Ambulatory Visit (INDEPENDENT_AMBULATORY_CARE_PROVIDER_SITE_OTHER): Payer: Medicare Other | Admitting: Family Medicine

## 2013-09-03 ENCOUNTER — Encounter: Payer: Self-pay | Admitting: Family Medicine

## 2013-09-03 VITALS — BP 137/81 | HR 81 | Temp 98.2°F | Ht 63.0 in | Wt 163.0 lb

## 2013-09-03 DIAGNOSIS — E119 Type 2 diabetes mellitus without complications: Secondary | ICD-10-CM | POA: Diagnosis not present

## 2013-09-03 DIAGNOSIS — I10 Essential (primary) hypertension: Secondary | ICD-10-CM

## 2013-09-03 DIAGNOSIS — E785 Hyperlipidemia, unspecified: Secondary | ICD-10-CM

## 2013-09-03 LAB — POCT GLYCOSYLATED HEMOGLOBIN (HGB A1C): Hemoglobin A1C: 8.2

## 2013-09-03 LAB — LDL CHOLESTEROL, DIRECT: Direct LDL: 119 mg/dL — ABNORMAL HIGH

## 2013-09-03 MED ORDER — ATORVASTATIN CALCIUM 20 MG PO TABS: 20.0000 mg | ORAL_TABLET | Freq: Every day | ORAL | Status: DC

## 2013-09-03 NOTE — Assessment & Plan Note (Signed)
H/o HLD, significant ASCVD risk factors and CAD equivalent with DM, will benefit from mod-high intensity statin. Already on ASA daily  Plan: 1. Start Atorvastatin 20mg  daily (moderate intensity, given age >2) 2. Direct LDL

## 2013-09-03 NOTE — Progress Notes (Signed)
Subjective:     Patient ID: Hayley Fisher, female   DOB: 1930-11-03, 80 y.o.   MRN: [id number]  HPI  CHRONIC DM, Type 2: Last HgbA1c 7.4 (05/01/13), elevated today at 8.2 Reports no specific concerns. CBGs: Avg 100-120s, Low 72, High 180. Checks CBGs up to 3x daily Meds: Lantus 8u daily in morning. Reports good compliance. Tolerating well w/o side-effects Currently on ACEi Complications: continued +peripheral neuropathy b/l LE  Lifestyle: -  Diet (reported poor over holidays, inc eating, inc sugar) -  Exercise (decreased recently, previously had been following TV aerobic regimen) Denies hypoglycemia, polyuria, visual changes, numbness or tingling.  CHRONIC HTN: Normotensive BP today, 137/81. Reports no complaints. Current Meds - Enalapril 20mg  daily   Reports good compliance, took meds today. Tolerating well, w/o complaints. Denies CP, dyspnea, claudication, HA, edema, dizziness / lightheadedness  HLD/ASCVD Risk Reduction: Last lipid panel checked in 2013, due for annual direct LDL monitoring. Currently not on statin therapy. Reports prior history of taking Zocor, unknown why she has stopped. Interested in starting statin today to help reduce 10 yr ASCVD risk, given DM.  Social Hx: Former smoker  Review of Systems  Per above HPI     Objective:   Physical Exam  BP 137/81  Pulse 81  Temp(Src) 98.2 F (36.8 C) (Oral)  Ht 5\' 3"  (1.6 m)  Wt 163 lb (73.936 kg)  BMI 28.88 kg/m2  Gen - very pleasant, well-appearing, NAD HEENT - PERRL, EOMI, patent nares w/o congestion, oropharynx clear, MMM Neck - supple, non-tender Heart - RRR, no murmurs heard Lungs - CTAB, no wheezing, crackles, or rhonchi. Normal work of breathing. Abd - soft, NTND, no masses, +active BS Ext - non-tender, no edema, peripheral pulses intact +2 b/l Skin - warm, dry, no rashes Neuro - awake, alert, oriented, grossly non-focal, intact muscle strength 5/5 b/l. Decreased distal sensation to light touch.  See DM Foot Exam report for monofilament testing.     Assessment:     See specific A&P problem list for details.      Plan:     See specific A&P problem list for details.

## 2013-09-03 NOTE — Patient Instructions (Signed)
Dear Hayley Fisher, Thank you for coming in to clinic today. It was good to see you!  Today we discussed your Diabetes and Cholesterol. 1. Your HgbA1c has increased from 7.4 to 8.2, and this is most likely because of increased food intake and decreased exercise. As we discussed, I do not plan to make changes to your insulin today, but would strongly encourage you to work on meeting lifestyle goals (improved diet and exercise) over the next 3 months to help reduce your HgbA1c. 2. Keep checking your sugar up to 3x daily, monitoring for low / high blood sugars as you are. 3. Keep taking all of your other medications. No changes today  We started a new medication today to help your cholesterol and reduce future cardiovascular risk. Atorvastatin (Lipitor) 20 mg, please take 1 tablet once daily as we discussed. This may cause some muscle aches and pains, if this happens we can discuss it at our next visit, these usually improve after a few weeks. If it is persistently worse, call and come in to be seen.  Some important numbers from today's visit: HgA1C - 8.2 BP - 137/81  Please schedule a follow-up appointment with me in 3 months for Diabetes Check-up.  If you have any other questions or concerns, please feel free to call the clinic to contact me. You may also schedule an earlier appointment if necessary.  However, if your symptoms get significantly worse, please go to the Emergency Department to seek immediate medical attention.  [name], [place]

## 2013-09-03 NOTE — Assessment & Plan Note (Signed)
Well-controlled HTN, BP today 137/81  Meds - Enalapril 20mg  daily No complications   Plan:  1. Continue current BP meds.  2. Lifestyle Mods - Diet/Exercise, Dec salt intake, inc K+ rich vegs

## 2013-09-03 NOTE — Assessment & Plan Note (Addendum)
Recent worsening of previously improved control Current HgbA1c 8.2 (09/03/13), last HgbA1c 7.4(05/01/13). Meds: Lantus 8u daily in AM only Complications - peripheral neuropathy HM: Eye Exam (performed 2014), Foot Exam (performed 09/03/13)  Plan:  1. Continue current therapy - no change today. Allow pt to improve lifestyle, plan to increase Lantus to 10u daily if continued increased A1c in 3 mo 2. Lifestyle Mods - Improved DM diet (dec carbs, inc vegs), exercise 3. Goal: decrease portion size, decrease sugar/sweets, increase TV aerobic exercise 4-5x weekly

## 2013-09-30 ENCOUNTER — Other Ambulatory Visit: Payer: Self-pay | Admitting: Family Medicine

## 2013-09-30 NOTE — Telephone Encounter (Signed)
Will fwd to MD.  Wana Mount L, CMA  

## 2013-10-01 ENCOUNTER — Other Ambulatory Visit: Payer: Self-pay | Admitting: *Deleted

## 2013-10-01 MED ORDER — "INSULIN SYRINGE-NEEDLE U-100 31G X 5/16"" 0.3 ML MISC": Status: DC

## 2013-10-16 DIAGNOSIS — H26499 Other secondary cataract, unspecified eye: Secondary | ICD-10-CM | POA: Diagnosis not present

## 2013-10-16 DIAGNOSIS — H4011X0 Primary open-angle glaucoma, stage unspecified: Secondary | ICD-10-CM | POA: Diagnosis not present

## 2013-11-22 ENCOUNTER — Other Ambulatory Visit: Payer: Self-pay | Admitting: Family Medicine

## 2013-11-29 ENCOUNTER — Encounter: Payer: Self-pay | Admitting: Family Medicine

## 2013-11-29 ENCOUNTER — Ambulatory Visit (INDEPENDENT_AMBULATORY_CARE_PROVIDER_SITE_OTHER): Payer: Medicare Other | Admitting: Family Medicine

## 2013-11-29 VITALS — BP 149/81 | HR 94 | Temp 98.1°F | Ht 63.0 in | Wt 162.0 lb

## 2013-11-29 DIAGNOSIS — R319 Hematuria, unspecified: Secondary | ICD-10-CM | POA: Diagnosis not present

## 2013-11-29 DIAGNOSIS — I10 Essential (primary) hypertension: Secondary | ICD-10-CM | POA: Diagnosis not present

## 2013-11-29 DIAGNOSIS — E119 Type 2 diabetes mellitus without complications: Secondary | ICD-10-CM

## 2013-11-29 DIAGNOSIS — N390 Urinary tract infection, site not specified: Secondary | ICD-10-CM

## 2013-11-29 LAB — POCT URINALYSIS DIPSTICK
BILIRUBIN UA: NEGATIVE
Blood, UA: NEGATIVE
GLUCOSE UA: NEGATIVE
KETONES UA: NEGATIVE
NITRITE UA: NEGATIVE
PH UA: 6
Protein, UA: NEGATIVE
Spec Grav, UA: <=1.005
Urobilinogen, UA: 0.2

## 2013-11-29 LAB — BASIC METABOLIC PANEL
BUN: 12 mg/dL (ref 6–23)
CHLORIDE: 103 meq/L (ref 96–112)
CO2: 28 meq/L (ref 19–32)
CREATININE: 0.77 mg/dL (ref 0.50–1.10)
Calcium: 8.9 mg/dL (ref 8.4–10.5)
Glucose, Bld: 139 mg/dL — ABNORMAL HIGH (ref 70–99)
POTASSIUM: 4.3 meq/L (ref 3.5–5.3)
Sodium: 139 mEq/L (ref 135–145)

## 2013-11-29 LAB — POCT UA - MICROSCOPIC ONLY

## 2013-11-29 LAB — POCT GLYCOSYLATED HEMOGLOBIN (HGB A1C): HEMOGLOBIN A1C: 6.7

## 2013-11-29 MED ORDER — CEPHALEXIN 500 MG PO CAPS: 500.0000 mg | ORAL_CAPSULE | Freq: Three times a day (TID) | ORAL | Status: DC

## 2013-11-29 NOTE — Assessment & Plan Note (Addendum)
Controlled HTN, BP today 149/81 Meds - Enalapril 20mg  daily No complications   Plan:  1. Continue current BP meds.  2. Lifestyle Mods - Diet/Exercise, Dec salt intake, Continue inc K+ rich vegs 3. BMET

## 2013-11-29 NOTE — Progress Notes (Deleted)
Subjective:     Patient ID: Hayley Fisher, female   DOB: 02-Nov-1930, 80 y.o.   MRN: [id number]  HPI   Review of Systems     Objective:   Physical Exam     Assessment:     ***    Plan:     ***

## 2013-11-29 NOTE — Assessment & Plan Note (Signed)
Symptomatic, with +UA - Gram stain with GPC clusters / chains  Plan: 1. Keflex 500mg  TID x 7 days 2. Urine culture (pending)

## 2013-11-29 NOTE — Progress Notes (Signed)
Patient ID: Hayley Fisher, female   DOB: 1930/12/21, 80 y.o.   MRN: [id number] Subjective:    HPI  HEMATURIA: - Reports recent complaint of "small spot of red in urine" 2 days ago. Since resolved. Now with clear urine. - Admits recent intermittent dysuria x past few days  CHRONIC DM, Type 2: Reports no specific concerns. CBGs: Avg 150s, Low 72, High 200. Checks CBGs up to 3x daily Meds: Lantus 8u daily in morning. Reports good compliance. Tolerating well w/o side-effects Currently on ACEi Complications: continued +peripheral neuropathy b/l LE  Lifestyle: -  Diet (inc vegetables / fruit, inc chicken/fish, no beef, dec portion size) -  Exercise (TV aerobic regimen, walking - up to 3-5x weekly) Denies hypoglycemia, polyuria, visual changes  CHRONIC HTN: Reports no complaints. Current Meds - Enalapril 20mg  daily   Reports good compliance, took meds today. Tolerating well, w/o complaints. Denies CP, dyspnea, claudication, HA, edema, dizziness / lightheadedness  Social Hx: Former smoker  Review of Systems  Per above HPI     Objective:   Physical Exam  BP 149/81  Pulse 94  Temp(Src) 98.1 F (36.7 C) (Oral)  Ht 5\' 3"  (1.6 m)  Wt 162 lb (73.483 kg)  BMI 28.70 kg/m2  Gen - very pleasant, well-appearing, NAD Heart - RRR, no murmurs heard Lungs - CTAB, no wheezing, crackles, or rhonchi. Normal work of breathing. Abd - soft, NTND, no masses, +active BS Ext - non-tender, no edema, peripheral pulses intact +2 b/l Skin - warm, dry, no rashes Neuro - awake, alert, oriented, grossly non-focal.Decreased distal sensation to light touch. See DM Foot Exam report for monofilament testing.     Assessment:     See specific A&P problem list for details.      Plan:     See specific A&P problem list for details.

## 2013-11-29 NOTE — Patient Instructions (Signed)
Dear Hayley Fisher, Thank you for coming in to clinic today. It was good to see you!  Today we discussed your Diabetes and Urinary Infection 1. Congratulations! Your HgbA1c has improved from 8.2 to 6.7! Great job! Keep up the good work with your exercise and dietary changes. No change to you insulin dose today. 2. You have a Urinary Tract Infection, we will culture your urine to confirm. I have sent an antibiotic (Keflex) to your pharmacy, you are to take one tablet 3 times a day for 7 days. We will call you if there needs to be a change to this antibiotic based on the culture. 3. I will mail you a letter with the results of your blood work. 4. Your BP is elevated today. Check occasionally at home. We will re-check next time.  Some important numbers from today's visit: HgA1C - 6.7 BP - 149/81  Please schedule a follow-up appointment with me in 3 months for Diabetes Check-up.  If you have any other questions or concerns, please feel free to call the clinic to contact me. You may also schedule an earlier appointment if necessary.  However, if your symptoms get significantly worse, please go to the Emergency Department to seek immediate medical attention.  [name], [place]

## 2013-11-29 NOTE — Assessment & Plan Note (Addendum)
Significant improved control Current HgbA1c 6.7 (11/29/13), last 8.2 (09/03/13) Meds: Lantus 8u daily in AM only Complications - peripheral neuropathy HM: Eye Exam (performed 2014), Foot Exam (performed 11/29/13)  Plan:  1. Continue current therapy 2. Continue successful lifestyle Mods / goals 3. RTC 3 months, if stable. q 6 months

## 2013-12-02 LAB — URINE CULTURE: Colony Count: >=100000

## 2013-12-19 ENCOUNTER — Telehealth: Payer: Self-pay | Admitting: Family Medicine

## 2013-12-19 NOTE — Telephone Encounter (Signed)
Patient seen on 11/29/13 and labs were drawn. Patient never received results by phone or letter. Would like her results. Please call patient.

## 2013-12-19 NOTE — Telephone Encounter (Signed)
Will fwd to PCP for review. Thanks. Mauricia Area

## 2013-12-20 NOTE — Telephone Encounter (Signed)
Reviewed chart. Called patient discussed lab results from 11/29/13, BMET (nml except inc gluc 139), UA (consistent with UTI), culture with strep viridans >100 CFU, should have been appropriately treated on antibiotics. Patient understood results and was appreciative for the call with results. I apologized for not mailing letter with results. Patient did still complain of some occasional back / hip pain, but feels that her UTI has resolved. She was advised to schedule clinic follow-up appointment as needed, and also to continue with routine DM follow-up 3 months after last visit.  [name], [place], PGY-1

## 2014-01-01 ENCOUNTER — Encounter: Payer: Self-pay | Admitting: Family Medicine

## 2014-01-01 ENCOUNTER — Ambulatory Visit (INDEPENDENT_AMBULATORY_CARE_PROVIDER_SITE_OTHER): Payer: Medicare Other | Admitting: Family Medicine

## 2014-01-01 VITALS — BP 118/69 | HR 85 | Temp 97.6°F | Ht 63.0 in | Wt 163.0 lb

## 2014-01-01 DIAGNOSIS — M5430 Sciatica, unspecified side: Secondary | ICD-10-CM | POA: Diagnosis not present

## 2014-01-01 MED ORDER — NAPROXEN 500 MG PO TABS: 500.0000 mg | ORAL_TABLET | Freq: Two times a day (BID) | ORAL | Status: DC

## 2014-01-01 MED ORDER — ACETAMINOPHEN 500 MG PO TABS: 500.0000 mg | ORAL_TABLET | Freq: Three times a day (TID) | ORAL | Status: DC | PRN

## 2014-01-01 NOTE — Patient Instructions (Signed)
Sciatica Sciatica is pain, weakness, numbness, or tingling along the path of the sciatic nerve. The nerve starts in the lower back and runs down the back of each leg. The nerve controls the muscles in the lower leg and in the back of the knee, while also providing sensation to the back of the thigh, lower leg, and the sole of your foot. Sciatica is a symptom of another medical condition. For instance, nerve damage or certain conditions, such as a herniated disk or bone spur on the spine, pinch or put pressure on the sciatic nerve. This causes the pain, weakness, or other sensations normally associated with sciatica. Generally, sciatica only affects one side of the body. CAUSES   Degenerative disk disease.  Herniated or slipped disc.  A pain disorder involving the narrow muscle in the buttocks (piriformis syndrome).  Pelvic injury or fracture.  SYMPTOMS  Symptoms can vary from mild to very severe. The symptoms usually travel from the low back to the buttocks and down the back of the leg. Symptoms can include:  Mild tingling or dull aches in the lower back, leg, or hip.  Numbness in the back of the calf or sole of the foot.  Burning sensations in the lower back, leg, or hip.  Sharp pains in the lower back, leg, or hip.  Leg weakness.  Severe back pain inhibiting movement. These symptoms may get worse with coughing, sneezing, laughing, or prolonged sitting or standing. Also, being overweight may worsen symptoms. DIAGNOSIS  Your caregiver will perform a physical exam to look for common symptoms of sciatica. He or she may ask you to do certain movements or activities that would trigger sciatic nerve pain. Other tests may be performed to find the cause of the sciatica. These may include:  Blood tests.  X-rays.  Imaging tests, such as an MRI or CT scan. TREATMENT  Treatment is directed at the cause of the sciatic pain. Sometimes, treatment is not necessary and the pain and discomfort  goes away on its own. If treatment is needed, your caregiver may suggest:  Over-the-counter medicines to relieve pain.  Prescription medicines, such as anti-inflammatory medicine, muscle relaxants, or narcotics.  Applying heat or ice to the painful area.  Steroid injections to lessen pain, irritation, and inflammation around the nerve.  Reducing activity during periods of pain.  Exercising and stretching to strengthen your abdomen and improve flexibility of your spine. Your caregiver may suggest losing weight if the extra weight makes the back pain worse.  Physical therapy.  Surgery to eliminate what is pressing or pinching the nerve, such as a bone spur or part of a herniated disk. HOME CARE INSTRUCTIONS   Only take over-the-counter or prescription medicines for pain or discomfort as directed by your caregiver.  Apply ice to the affected area for 20 minutes, 3 4 times a day for the first 48 72 hours. Then try heat in the same way.  Exercise, stretch, or perform your usual activities if these do not aggravate your pain.  Attend physical therapy sessions as directed by your caregiver.  Keep all follow-up appointments as directed by your caregiver.  Do not wear high heels or shoes that do not provide proper support.  Check your mattress to see if it is too soft. A firm mattress may lessen your pain and discomfort. SEEK IMMEDIATE MEDICAL CARE IF:   You lose control of your bowel or bladder (incontinence).  You have increasing weakness in the lower back, pelvis, buttocks, or legs.  You have redness or swelling of your back.  You have a burning sensation when you urinate.  You have pain that gets worse when you lie down or awakens you at night.  Your pain is worse than you have experienced in the past.  Your pain is lasting longer than 4 weeks.  You are suddenly losing weight without reason.   Take Naproxen twice a day and Tylenol 3 times a day. Try to schedule them for  3-4 days then as needed. F/u with your primary doctor in 1-2 weeks or sooner if needed.

## 2014-01-01 NOTE — Assessment & Plan Note (Signed)
Only symptomatic treatment at this time since there is no other signs that indicate more extensive work up. Nevertheless if this continues this studies may be appropriated. Recommended close f/u.

## 2014-01-01 NOTE — Progress Notes (Signed)
Family Medicine Office Visit Note   Subjective:   Patient ID: Hayley Fisher, female  DOB: 1930/10/07, 80 y.o.. MRN: [id number]   Pt that comes today complaining of buttock and left leg pain for about a week. She denies noticeable precipitating factor. Denies back pain, saddle anesthesia, focalized weakness to her LE or urinary/fecal incontinence. She also denies fever, chills, nausea vomiting or other systemic symptoms.   Review of Systems:  Per HPI  Objective:   Physical Exam: Gen:  NAD HEENT: Moist mucous membranes CV: Regular rate and rhythm, no murmurs rubs or gallops PULM: Clear to auscultation bilaterally. No wheezes/rales/rhonchi ABD: Soft, non tender, non distended, normal bowel sounds EXT: No edema Neuro: Alert and oriented x3. No focalization Back - Normal skin, Spine with normal alignment and no visual deformity.  No tenderness to vertebral process palpation. Paraspinous muscles are not tender and without spasm. Range of motion is not limited. Straight leg raise is positive on the left. Normal pulses bilaterally.   Assessment & Plan:

## 2014-01-07 ENCOUNTER — Other Ambulatory Visit: Payer: Self-pay | Admitting: Family Medicine

## 2014-01-07 DIAGNOSIS — I10 Essential (primary) hypertension: Secondary | ICD-10-CM

## 2014-02-04 ENCOUNTER — Other Ambulatory Visit: Payer: Self-pay | Admitting: Family Medicine

## 2014-02-10 NOTE — Telephone Encounter (Signed)
Patient calls inquiring status of this refill.

## 2014-03-19 ENCOUNTER — Other Ambulatory Visit: Payer: Self-pay

## 2014-03-19 DIAGNOSIS — Z1231 Encounter for screening mammogram for malignant neoplasm of breast: Secondary | ICD-10-CM

## 2014-04-01 DIAGNOSIS — H4011X0 Primary open-angle glaucoma, stage unspecified: Secondary | ICD-10-CM | POA: Diagnosis not present

## 2014-04-01 DIAGNOSIS — H26499 Other secondary cataract, unspecified eye: Secondary | ICD-10-CM | POA: Diagnosis not present

## 2014-04-16 ENCOUNTER — Ambulatory Visit
Admission: RE | Admit: 2014-04-16 | Discharge: 2014-04-16 | Disposition: A | Discharge status: Home / Self Care | Payer: Medicare Other | Source: Ambulatory Visit

## 2014-04-16 DIAGNOSIS — Z1231 Encounter for screening mammogram for malignant neoplasm of breast: Secondary | ICD-10-CM | POA: Diagnosis not present

## 2014-07-03 ENCOUNTER — Ambulatory Visit (INDEPENDENT_AMBULATORY_CARE_PROVIDER_SITE_OTHER): Payer: Medicare Other | Admitting: Family Medicine

## 2014-07-03 ENCOUNTER — Encounter: Payer: Self-pay | Admitting: Family Medicine

## 2014-07-03 VITALS — BP 144/77 | HR 98 | Temp 98.1°F | Ht 63.0 in | Wt 161.0 lb

## 2014-07-03 DIAGNOSIS — N390 Urinary tract infection, site not specified: Secondary | ICD-10-CM | POA: Insufficient documentation

## 2014-07-03 DIAGNOSIS — R319 Hematuria, unspecified: Secondary | ICD-10-CM

## 2014-07-03 DIAGNOSIS — E119 Type 2 diabetes mellitus without complications: Secondary | ICD-10-CM | POA: Diagnosis not present

## 2014-07-03 DIAGNOSIS — Z23 Encounter for immunization: Secondary | ICD-10-CM | POA: Diagnosis not present

## 2014-07-03 LAB — POCT URINALYSIS DIPSTICK
Bilirubin, UA: NEGATIVE
Glucose, UA: NEGATIVE
KETONES UA: NEGATIVE
NITRITE UA: NEGATIVE
PH UA: 6.5
PROTEIN UA: NEGATIVE
Spec Grav, UA: 1.01
UROBILINOGEN UA: 0.2

## 2014-07-03 LAB — POCT GLYCOSYLATED HEMOGLOBIN (HGB A1C): HEMOGLOBIN A1C: 8

## 2014-07-03 LAB — POCT UA - MICROSCOPIC ONLY

## 2014-07-03 MED ORDER — CEPHALEXIN 500 MG PO CAPS: 500.0000 mg | ORAL_CAPSULE | Freq: Three times a day (TID) | ORAL | Status: AC

## 2014-07-03 NOTE — Patient Instructions (Signed)
Take keflex (the antibiotic for your urinary tract infection) for the next 10 days.  If you continue to notice bleeding you must return to the clinic for evaluation.

## 2014-07-03 NOTE — Addendum Note (Signed)
Addended by: Lianne Bushy on: 07/03/2014 03:16 PM   Modules accepted: Orders

## 2014-07-03 NOTE — Progress Notes (Signed)
Patient ID: Hayley Fisher, female   DOB: March 12, 1931, 80 y.o.   MRN: [id number]   Subjective:  Hayley Fisher is a 80 y.o. female who complains of urinary frequency, and urinary spotting with red/pink x 2 days, without flank pain, fever, chills, or abnormal vaginal discharge or bleeding.   Has a history of UTIs, most recently 7 months ago.  Objective:  BP 144/77 mmHg  Pulse 98  Temp(Src) 98.1 F (36.7 C) (Oral)  Ht 5\' 3"  (1.6 m)  Wt 161 lb (73.029 kg)  BMI 28.53 kg/m2  Gen:  80 y.o. female in no distress Abd: Soft, NTND, BS present, no suprapubic tenderness. No CVA tenderness.  Urine dipstick shows positive for WBC's, positive for RBC's and positive for leukocytes.   Micro exam: 10-20 WBC's per HPF, 20-30 RBC's per HPF and 2+ bacteria.  Assessment & Plan:  Hayley Fisher is a 80 y.o. female with uncomplicated urinary tract infection.  Treatment per orders - also push fluids, may use Pyridium OTC prn. Call or return to clinic prn if these symptoms worsen or fail to improve as anticipated. Problem List Items Addressed This Visit      Genitourinary   Acute lower UTI    Hematuria and increased urinary frequency with positive urinalysis for bacteria, WBC, RBCs. Pt suspects urinary source but cannot rule out other sources. For now we will treat the UTI and reevaluate if bleeding continues past the  days of treatment. She has a history of UTIs and I suspect this is due to age-related pH changes as opposed to treatment failures, but will send this to culture and follow up sensitivities.  - Keflex x7 days - Urine culture pending.     Relevant Medications      cephALEXin (KEFLEX) capsule   Other Relevant Orders      Urine culture    Other Visit Diagnoses    Diabetes mellitus without complication    -  Primary    Relevant Orders       POCT HgB A1C (Completed)    Hematuria        Relevant Orders       POCT urinalysis dipstick (Completed)       POCT UA - Microscopic Only (Completed)

## 2014-07-03 NOTE — Addendum Note (Signed)
Addended by: [place] Pink on: 07/03/2014 03:43 PM   Modules accepted: Orders

## 2014-07-03 NOTE — Assessment & Plan Note (Signed)
Hematuria and increased urinary frequency with positive urinalysis for bacteria, WBC, RBCs. Pt suspects urinary source but cannot rule out other sources. For now we will treat the UTI and reevaluate if bleeding continues past the  days of treatment. She has a history of UTIs and I suspect this is due to age-related pH changes as opposed to treatment failures, but will send this to culture and follow up sensitivities.  - Keflex x7 days - Urine culture pending.

## 2014-07-05 LAB — URINE CULTURE: Colony Count: 50000

## 2014-09-08 ENCOUNTER — Encounter: Payer: Self-pay | Admitting: Family Medicine

## 2014-09-08 ENCOUNTER — Ambulatory Visit (INDEPENDENT_AMBULATORY_CARE_PROVIDER_SITE_OTHER): Payer: Medicare Other | Admitting: Family Medicine

## 2014-09-08 VITALS — BP 141/74 | HR 86 | Temp 97.7°F | Ht 63.0 in | Wt 166.0 lb

## 2014-09-08 DIAGNOSIS — N3000 Acute cystitis without hematuria: Secondary | ICD-10-CM

## 2014-09-08 DIAGNOSIS — N390 Urinary tract infection, site not specified: Secondary | ICD-10-CM

## 2014-09-08 DIAGNOSIS — R358 Other polyuria: Secondary | ICD-10-CM

## 2014-09-08 DIAGNOSIS — R3589 Other polyuria: Secondary | ICD-10-CM

## 2014-09-08 LAB — POCT URINALYSIS DIPSTICK
Bilirubin, UA: NEGATIVE
Glucose, UA: 250
KETONES UA: NEGATIVE
NITRITE UA: NEGATIVE
Protein, UA: NEGATIVE
RBC UA: NEGATIVE
Spec Grav, UA: 1.015
Urobilinogen, UA: 0.2
pH, UA: 6

## 2014-09-08 LAB — POCT UA - MICROSCOPIC ONLY

## 2014-09-08 MED ORDER — CEPHALEXIN 500 MG PO CAPS: 500.0000 mg | ORAL_CAPSULE | Freq: Three times a day (TID) | ORAL | Status: DC

## 2014-09-08 NOTE — Patient Instructions (Signed)
It sounds like a Urinary Tract Infection. Take Keflex antibiotic 3 times daily for 7 days. Finish all pills. If symptoms get significantly worse, please return sooner, otherwise if persistent symptoms return in 2 weeks for re-check  Please schedule a follow-up appointment with Dr. Parks Ranger as needed.  If you have any other questions or concerns, please feel free to call the clinic to contact me. You may also schedule an earlier appointment if necessary.  However, if your symptoms get significantly worse, please go to the Emergency Department to seek immediate medical attention.  Hayley Putnam, DO [facility]   Urinary Tract Infection Urinary tract infections (UTIs) can develop anywhere along your urinary tract. Your urinary tract is your body's drainage system for removing wastes and extra water. Your urinary tract includes two kidneys, two ureters, a bladder, and a urethra. Your kidneys are a pair of bean-shaped organs. Each kidney is about the size of your fist. They are located below your ribs, one on each side of your spine. CAUSES Infections are caused by microbes, which are microscopic organisms, including fungi, viruses, and bacteria. These organisms are so small that they can only be seen through a microscope. Bacteria are the microbes that most commonly cause UTIs. SYMPTOMS  Symptoms of UTIs may vary by age and gender of the patient and by the location of the infection. Symptoms in young women typically include a frequent and intense urge to urinate and a painful, burning feeling in the bladder or urethra during urination. Older women and men are more likely to be tired, shaky, and weak and have muscle aches and abdominal pain. A fever may mean the infection is in your kidneys. Other symptoms of a kidney infection include pain in your back or sides below the ribs, nausea, and vomiting. DIAGNOSIS To diagnose a UTI, your caregiver will ask you about your  symptoms. Your caregiver also will ask to provide a urine sample. The urine sample will be tested for bacteria and white blood cells. White blood cells are made by your body to help fight infection. TREATMENT  Typically, UTIs can be treated with medication. Because most UTIs are caused by a bacterial infection, they usually can be treated with the use of antibiotics. The choice of antibiotic and length of treatment depend on your symptoms and the type of bacteria causing your infection. HOME CARE INSTRUCTIONS  If you were prescribed antibiotics, take them exactly as your caregiver instructs you. Finish the medication even if you feel better after you have only taken some of the medication.  Drink enough water and fluids to keep your urine clear or pale yellow.  Avoid caffeine, tea, and carbonated beverages. They tend to irritate your bladder.  Empty your bladder often. Avoid holding urine for long periods of time.  Empty your bladder before and after sexual intercourse.  After a bowel movement, women should cleanse from front to back. Use each tissue only once. SEEK MEDICAL CARE IF:   You have back pain.  You develop a fever.  Your symptoms do not begin to resolve within 3 days. SEEK IMMEDIATE MEDICAL CARE IF:   You have severe back pain or lower abdominal pain.  You develop chills.  You have nausea or vomiting.  You have continued burning or discomfort with urination. MAKE SURE YOU:   Understand these instructions.  Will watch your condition.  Will get help right away if you are not doing well or get worse. Document Released: 05/18/2005 Document Revised: 02/07/2012  Document Reviewed: 09/16/2011 [facility] Patient Information 2015 [place], [place]. This information is not intended to replace advice given to you by your health care provider. Make sure you discuss any questions you have with your health care provider.

## 2014-09-08 NOTE — Assessment & Plan Note (Signed)
Consistent acute uncomplicated cystitis/UTI, concern with recurrent UTIs symptoms however seems to be (about 2x yearly, with last 11/2013 and 06/2014) previously resolved with Keflex. Unclear etiology of recurrence, can be age-related, seems to have appropriate hygiene and wiping. Known hx bladder suspension and seems to be voiding completely without other urinary symptoms.  Plan: 1. UA / micro - neg nitrite, small leuk, neg RBC, WBC 0-3 and bacteria trace (similar to previous UAs) not overwhelmingly convincing UA but with +symptoms 2. Ordered Urine culture 3. Keflex 500mg  TID x 7 days 4. RTC re-eval if no improvement 2 weeks, return precautions given for pyelo or acute worsening. 5. If continues with recurrent UTIs more frequently 3x within 6 months, would advise follow-up with Urology (Dr. Matilde Sprang)

## 2014-09-08 NOTE — Progress Notes (Signed)
   Subjective:    Patient ID: Hayley Fisher, female    DOB: September 13, 1930, 81 y.o.   MRN: [id number]  Patient presents for a same day appointment.  HPI  URINARY FREQUENCY: - Reports symptoms with increased urinary frequency and nocturia x 1 week without significant improvement, associated with intermittent central lower abdominal pain, some dysuria, and occasional nausea without vomiting. Similar to previous UTI, concerned with multiple UTIs over past few years (last 11/2013 and 06/2014), previously resolved with antibiotics. - History of Bladder Suspension/Tacking surgery per Dr. Matilde Sprang ([facility]), last follow-up about 2-3 years ago. - Denies fevers/chills, flank pain, vomiting, persistent abdominal pain, hematuria, urinary odor  I have reviewed and updated the following as appropriate: allergies and current medications  Social Hx: - Former smoker  Review of Systems  See above HPI    Objective:   Physical Exam  BP 141/74 mmHg  Pulse 86  Temp(Src) 97.7 F (36.5 C) (Oral)  Ht 5\' 3"  (1.6 m)  Wt 166 lb (75.297 kg)  BMI 29.41 kg/m2  Gen - well-appearing, pleasant, cooperative, NAD HEENT - MMM Abd - soft, minimal suprapubic tenderness otherwise abd NTND, no masses, +active BS MSK - Back non-tender over mid-lower back with no CVAT. Skin - warm, dry, no rashes     Assessment & Plan:   See specific A&P problem list for details.

## 2014-09-09 LAB — URINE CULTURE

## 2014-09-22 ENCOUNTER — Encounter: Payer: Self-pay | Admitting: Family Medicine

## 2014-09-22 ENCOUNTER — Ambulatory Visit (INDEPENDENT_AMBULATORY_CARE_PROVIDER_SITE_OTHER): Payer: Medicare Other | Admitting: Family Medicine

## 2014-09-22 VITALS — BP 148/94 | HR 101 | Temp 98.2°F | Ht 63.0 in | Wt 164.4 lb

## 2014-09-22 DIAGNOSIS — N390 Urinary tract infection, site not specified: Secondary | ICD-10-CM

## 2014-09-22 DIAGNOSIS — N760 Acute vaginitis: Secondary | ICD-10-CM

## 2014-09-22 DIAGNOSIS — R300 Dysuria: Secondary | ICD-10-CM | POA: Diagnosis not present

## 2014-09-22 LAB — POCT URINALYSIS DIPSTICK
Bilirubin, UA: NEGATIVE
GLUCOSE UA: 500
Ketones, UA: NEGATIVE
Nitrite, UA: NEGATIVE
PROTEIN UA: NEGATIVE
RBC UA: NEGATIVE
Spec Grav, UA: 1.01
UROBILINOGEN UA: 0.2
pH, UA: 6

## 2014-09-22 LAB — POCT UA - MICROSCOPIC ONLY

## 2014-09-22 MED ORDER — LEVOFLOXACIN 500 MG PO TABS: 500.0000 mg | ORAL_TABLET | Freq: Every day | ORAL | Status: DC

## 2014-09-22 MED ORDER — FLUCONAZOLE 150 MG PO TABS: ORAL_TABLET | ORAL | Status: DC

## 2014-09-22 NOTE — Progress Notes (Signed)
   Subjective:    Patient ID: Hayley Fisher, female    DOB: 22-Mar-1931, 81 y.o.   MRN: [id number]  HPI: Pt presents to clinic for recurrent dysuria (burning after urination) and frequent urination. She saw Dr. Parks Ranger on 1/18 for similar symptoms and was treated with Keflex for 7 days, which helped. Her symptoms cleared up but then returned about 2-3 days ago. She denies frank hematuria or bladder pain while urinating but does have occasional incomplete emptying symptoms. She denies fever / chills, abdominal pain, or frank vomiting, but does have some occasional nausea. She has no flank pain. She does endorse some vaginal itching, occasionally, but no vaginal discharge.  Of note, she had bladder suspension/tacking surgery  by Dr. Matilde Sprang at [facility] about 5 years ago. She last saw him about 2-3 year ago. Also of note, pt is a diabetic, and she takes Lantus 8 units daily. Her CBG's are usually 100's-200's.  Review of Systems: As above.     Objective:   Physical Exam BP 148/94 mmHg  Pulse 101  Temp(Src) 98.2 F (36.8 C) (Oral)  Ht 5\' 3"  (1.6 m)  Wt 164 lb 6.4 oz (74.571 kg)  BMI 29.13 kg/m2 Gen: well-appearing elderly female in NAD HEENT: [place]/AT, EOMI, PERRLA, MMM Cardio: RRR, no murmur appreciated Pulm: CTAB, no wheezes Abd: soft, nontender, BS+  No suprapubic tenderness, no CVA tenderness, no flank tenderness Ext: warm, well-perfused GU: exam deferred (pt declined)  UA: positive only for glucose and trace leukocytes, rare RBC on microscopy     Assessment & Plan:  81yo with recurrent dysuria / UTI-type symptoms, also with some symptoms consistent with vulvovaginitis - s/p recent treatment with Keflex for UTI, with culture showing 60k CFU/mL mixed phenotypes - s/p bladder surgery without having seen urology for a few years, at least - possible component of glucosuria causing some irritation - possible yeast infection given glucosuria and recent abx use  Plan: -  reculturing urine, with new Rx for Levaquin for broader coverage given symptoms did improve but then recurred - strongly recommended f/u with urology; should not need new referral but pt instructed to call if she does - Rx for Diflucan, one pill today and one pill in 1 week, to cover for possible yeast infection - instructed pt to f/u with Dr. Parks Ranger as needed, and to return to the clinic or present to the ED if symptoms worsen or progress  Note FYI to Dr. Henderson Baltimore, MD PGY-3, [facility] 09/22/2014, 7:42 PM

## 2014-09-22 NOTE — Patient Instructions (Signed)
Thank you for coming in, today!  I think you might have a urinary tract infection that didn't completely clear. I want you to take another antibiotic, once a day for 7 days, called Levaquin (levofloxacin). I also want you to take an antifungal medication that should help with the itching. It is called Diflucan (fluconazole). Take one pill today, then one pill next Monday.  Call Dr. Mikle Bosworth office at [phone] and make an appointment to see him about your urination symptoms. Come back to see Dr. Parks Ranger, as you need.  Please feel free to call with any questions or concerns at any time, at [phone]. --Dr. Venetia Maxon

## 2014-09-23 LAB — URINE CULTURE: Colony Count: 30000

## 2014-10-18 ENCOUNTER — Emergency Department (HOSPITAL_COMMUNITY)
Admission: EM | Admit: 2014-10-18 | Discharge: 2014-10-18 | Disposition: A | Discharge status: Home / Self Care | Payer: Medicare Other | Attending: Emergency Medicine | Admitting: Emergency Medicine

## 2014-10-18 ENCOUNTER — Emergency Department (HOSPITAL_COMMUNITY): Payer: Medicare Other

## 2014-10-18 ENCOUNTER — Encounter (HOSPITAL_COMMUNITY): Payer: Self-pay | Admitting: Emergency Medicine

## 2014-10-18 DIAGNOSIS — M5136 Other intervertebral disc degeneration, lumbar region: Secondary | ICD-10-CM

## 2014-10-18 DIAGNOSIS — E119 Type 2 diabetes mellitus without complications: Secondary | ICD-10-CM | POA: Diagnosis not present

## 2014-10-18 DIAGNOSIS — Z79899 Other long term (current) drug therapy: Secondary | ICD-10-CM | POA: Diagnosis not present

## 2014-10-18 DIAGNOSIS — Z87891 Personal history of nicotine dependence: Secondary | ICD-10-CM | POA: Diagnosis not present

## 2014-10-18 DIAGNOSIS — I10 Essential (primary) hypertension: Secondary | ICD-10-CM | POA: Diagnosis not present

## 2014-10-18 DIAGNOSIS — Z794 Long term (current) use of insulin: Secondary | ICD-10-CM | POA: Diagnosis not present

## 2014-10-18 DIAGNOSIS — Z7982 Long term (current) use of aspirin: Secondary | ICD-10-CM | POA: Diagnosis not present

## 2014-10-18 DIAGNOSIS — M1990 Unspecified osteoarthritis, unspecified site: Secondary | ICD-10-CM | POA: Diagnosis not present

## 2014-10-18 DIAGNOSIS — M545 Low back pain: Secondary | ICD-10-CM | POA: Diagnosis present

## 2014-10-18 LAB — URINALYSIS, ROUTINE W REFLEX MICROSCOPIC
Bilirubin Urine: NEGATIVE
Glucose, UA: 100 mg/dL — AB
HGB URINE DIPSTICK: NEGATIVE
KETONES UR: NEGATIVE mg/dL
NITRITE: NEGATIVE
Protein, ur: NEGATIVE mg/dL
Specific Gravity, Urine: 1.01 (ref 1.005–1.030)
Urobilinogen, UA: 0.2 mg/dL (ref 0.0–1.0)
pH: 6.5 (ref 5.0–8.0)

## 2014-10-18 LAB — CBC WITH DIFFERENTIAL/PLATELET
Basophils Absolute: 0 10*3/uL (ref 0.0–0.1)
Basophils Relative: 0 % (ref 0–1)
Eosinophils Absolute: 0.1 10*3/uL (ref 0.0–0.7)
Eosinophils Relative: 4 % (ref 0–5)
HCT: 34.8 % — ABNORMAL LOW (ref 36.0–46.0)
HEMOGLOBIN: 11.5 g/dL — AB (ref 12.0–15.0)
LYMPHS ABS: 0.9 10*3/uL (ref 0.7–4.0)
Lymphocytes Relative: 32 % (ref 12–46)
MCH: 31.1 pg (ref 26.0–34.0)
MCHC: 33 g/dL (ref 30.0–36.0)
MCV: 94.1 fL (ref 78.0–100.0)
Monocytes Absolute: 0.3 10*3/uL (ref 0.1–1.0)
Monocytes Relative: 10 % (ref 3–12)
NEUTROS ABS: 1.5 10*3/uL — AB (ref 1.7–7.7)
NEUTROS PCT: 54 % (ref 43–77)
PLATELETS: 213 10*3/uL (ref 150–400)
RBC: 3.7 MIL/uL — ABNORMAL LOW (ref 3.87–5.11)
RDW: 13.1 % (ref 11.5–15.5)
WBC: 2.7 10*3/uL — ABNORMAL LOW (ref 4.0–10.5)

## 2014-10-18 LAB — BASIC METABOLIC PANEL
Anion gap: 7 (ref 5–15)
BUN: 16 mg/dL (ref 6–23)
CHLORIDE: 102 mmol/L (ref 96–112)
CO2: 30 mmol/L (ref 19–32)
CREATININE: 0.75 mg/dL (ref 0.50–1.10)
Calcium: 9 mg/dL (ref 8.4–10.5)
GFR calc Af Amer: 90 mL/min — ABNORMAL LOW (ref >90)
GFR calc non Af Amer: 77 mL/min — ABNORMAL LOW (ref >90)
GLUCOSE: 104 mg/dL — AB (ref 70–99)
POTASSIUM: 4.2 mmol/L (ref 3.5–5.1)
Sodium: 139 mmol/L (ref 135–145)

## 2014-10-18 LAB — URINE MICROSCOPIC-ADD ON

## 2014-10-18 MED ORDER — TRAMADOL HCL 50 MG PO TABS
50.0000 mg | ORAL_TABLET | Freq: Once | ORAL | Status: AC
  Administered 2014-10-18: 50 mg via ORAL
  Filled 2014-10-18: qty 1

## 2014-10-18 MED ORDER — TRAMADOL HCL 50 MG PO TABS: 50.0000 mg | ORAL_TABLET | Freq: Four times a day (QID) | ORAL | Status: DC | PRN

## 2014-10-18 NOTE — ED Notes (Signed)
Patient transported to X-ray 

## 2014-10-18 NOTE — ED Notes (Signed)
Awake. Verbally responsive. A/O x4. Resp even and unlabored. No audible adventitious breath sounds noted. ABC's intact.  

## 2014-10-18 NOTE — ED Notes (Signed)
Patient states that she will take a cab home ED Charge to call cab company

## 2014-10-18 NOTE — Discharge Instructions (Signed)
Degenerative Disk Disease  Degenerative disk disease is a condition caused by the changes that occur in the cushions of the backbone (spinal disks) as you grow older. Spinal disks are soft and compressible disks located between the bones of the spine (vertebrae). They act like shock absorbers. Degenerative disk disease can affect the whole spine. However, the neck and lower back are most commonly affected. Many changes can occur in the spinal disks with aging, such as:   The spinal disks may dry and shrink.   Small tears may occur in the tough, outer covering of the disk (annulus).   The disk space may become smaller due to loss of water.   Abnormal growths in the bone (spurs) may occur. This can put pressure on the nerve roots exiting the spinal canal, causing pain.   The spinal canal may become narrowed.  CAUSES   Degenerative disk disease is a condition caused by the changes that occur in the spinal disks with aging. The exact cause is not known, but there is a genetic basis for many patients. Degenerative changes can occur due to loss of fluid in the disk. This makes the disk thinner and reduces the space between the backbones. Small cracks can develop in the outer layer of the disk. This can lead to the breakdown of the disk. You are more likely to get degenerative disk disease if you are overweight. Smoking cigarettes and doing heavy work such as weightlifting can also increase your risk of this condition. Degenerative changes can start after a sudden injury. Growth of bone spurs can compress the nerve roots and cause pain.   SYMPTOMS   The symptoms vary from person to person. Some people may have no pain, while others have severe pain. The pain may be so severe that it can limit your activities. The location of the pain depends on the part of your backbone that is affected. You will have neck or arm pain if a disk in the neck area is affected. You will have pain in your back, buttocks, or legs if a disk  in the lower back is affected. The pain becomes worse while bending, reaching up, or with twisting movements. The pain may start gradually and then get worse as time passes. It may also start after a major or minor injury. You may feel numbness or tingling in the arms or legs.   DIAGNOSIS   Your caregiver will ask you about your symptoms and about activities or habits that may cause the pain. He or she may also ask about any injuries, diseases, or treatments you have had earlier. Your caregiver will examine you to check for the range of movement that is possible in the affected area, to check for strength in your extremities, and to check for sensation in the areas of the arms and legs supplied by different nerve roots. An X-ray of the spine may be taken. Your caregiver may suggest other imaging tests, such as magnetic resonance imaging (MRI), if needed.   TREATMENT   Treatment includes rest, modifying your activities, and applying ice and heat. Your caregiver may prescribe medicines to reduce your pain and may ask you to do some exercises to strengthen your back. In some cases, you may need surgery. You and your caregiver will decide on the treatment that is best for you.  HOME CARE INSTRUCTIONS    Follow proper lifting and walking techniques as advised by your caregiver.   Maintain good posture.   Exercise regularly   as advised.   Perform relaxation exercises.   Change your sitting, standing, and sleeping habits as advised. Change positions frequently.   Lose weight as advised.   Stop smoking if you smoke.   Wear supportive footwear.  SEEK MEDICAL CARE IF:   Your pain does not go away within 1 to 4 weeks.  SEEK IMMEDIATE MEDICAL CARE IF:    Your pain is severe.   You notice weakness in your arms, hands, or legs.   You begin to lose control of your bladder or bowel movements.  MAKE SURE YOU:    Understand these instructions.   Will watch your condition.   Will get help right away if you are not doing  well or get worse.  Document Released: 06/05/2007 Document Revised: 10/31/2011 Document Reviewed: 12/10/2013  [employer] Patient Information 2015 [employer]. This information is not intended to replace advice given to you by your health care provider. Make sure you discuss any questions you have with your health care provider.

## 2014-10-18 NOTE — ED Notes (Signed)
Pt states she has seen MD x 2 for UTI, been given Abx, but it has not resolved. Pt c/o hip and leg pain. Pt denies dysuria.

## 2014-10-18 NOTE — ED Notes (Signed)
Updated pt and family that after all labs result, the EDP will be in to update them.

## 2014-10-18 NOTE — ED Notes (Signed)
Dr.Knapp at bedside  

## 2014-10-18 NOTE — ED Provider Notes (Signed)
CSN: [id number]     Arrival date & time 10/18/14  1304 History   First MD Initiated Contact with Patient 10/18/14 1530     Chief Complaint  Patient presents with  . Urinary Tract Infection  . Back Pain    HPI Pt has been having pain in her back for a few weeks to a month.  It has been gradually getting worse.  The pain is primarily in the right hip and lower back and radiates down her leg.  When she coughs, walks, bends her pain increases.   No numbness or weakness.  She tried some tylenol but it has not helped.  She has seen her doctor in the past and was told it could be related to arthritis.    She has not had any xrays  She did have a uti recently and has taken abx. She still has a little bit of dysuria but that is not bad.  She is having some urinary frequency.  No fevers.  Some chills. Past Medical History  Diagnosis Date  . Hypertension   . Hyperlipidemia   . Diabetes mellitus   . Arthritis    Past Surgical History  Procedure Laterality Date  . Bladder tacking    . Bladder suspension     History reviewed. No pertinent family history. History  Substance Use Topics  . Smoking status: Former Smoker    Quit date: 11/29/1980  . Smokeless tobacco: Never Used  . Alcohol Use: No   OB History    No data available     Review of Systems  All other systems reviewed and are negative.     Allergies  Levemir  Home Medications   Prior to Admission medications   Medication Sig Start Date End Date Taking? Authorizing Provider  aspirin 81 MG chewable tablet Chew 81 mg by mouth every morning.    Yes Historical Provider, MD  brimonidine (ALPHAGAN P) 0.1 % SOLN Place 1 drop into both eyes 2 (two) times daily. 09/20/11  Yes   enalapril (VASOTEC) 20 MG tablet TAKE ONE TABLET BY MOUTH ONCE DAILY 01/07/14  Yes [name], DO  Insulin Syringe-Needle U-100 (RELION INSULIN SYR 0.3ML/31G) 31G X 5/16" 0.3 ML MISC Use daily with each insulin injection. 10/01/13  Yes Alexander  Parks Ranger, DO  LANTUS 100 UNIT/ML injection INJECT 8 UNITS SUBCUTANEOUSLY IN THE MORNING   Yes Alexander Karamalegos, DO  latanoprost (XALATAN) 0.005 % ophthalmic solution Place 1 drop into the left eye at bedtime.  09/14/12  Yes Historical Provider, MD  Multiple Vitamins-Minerals (PRESERVISION AREDS PO) Take 1 tablet by mouth every morning.    Yes Historical Provider, MD  naproxen sodium (ANAPROX) 220 MG tablet Take 440 mg by mouth every 8 (eight) hours as needed (pain/headache).   Yes Historical Provider, MD  acetaminophen (TYLENOL) 500 MG tablet Take 1 tablet (500 mg total) by mouth every 8 (eight) hours as needed. 01/01/14   Dayarmys Piloto de Gwendalyn Ege, MD  traMADol (ULTRAM) 50 MG tablet Take 1 tablet (50 mg total) by mouth every 6 (six) hours as needed. 10/18/14   Dorie Rank, MD   BP 121/72 mmHg  Pulse 67  Temp(Src) 97.8 F (36.6 C) (Oral)  Resp 18  SpO2 99% Physical Exam  Constitutional: She appears well-developed and well-nourished. No distress.  HENT:  Head: Normocephalic and atraumatic.  Right Ear: External ear normal.  Left Ear: External ear normal.  Eyes: Conjunctivae are normal. Right eye exhibits no discharge. Left eye exhibits no  discharge. No scleral icterus.  Neck: Neck supple. No tracheal deviation present.  Cardiovascular: Normal rate, regular rhythm and intact distal pulses.   Pulmonary/Chest: Effort normal and breath sounds normal. No stridor. No respiratory distress. She has no wheezes. She has no rales.  Abdominal: Soft. Bowel sounds are normal. She exhibits no distension. There is no tenderness. There is no rebound and no guarding.  Musculoskeletal: She exhibits tenderness (mild paraspinal lumbar). She exhibits no edema.  Neurological: She is alert. She has normal strength. No cranial nerve deficit (no facial droop, extraocular movements intact, no slurred speech) or sensory deficit. She exhibits normal muscle tone. She displays no seizure activity. Coordination normal.   5/5 plantar flexion, sensation intact in the lower extremities   Skin: Skin is warm and dry. No rash noted.  Psychiatric: She has a normal mood and affect.  Nursing note and vitals reviewed.   ED Course  Procedures (including critical care time) Labs Review Labs Reviewed  URINALYSIS, ROUTINE W REFLEX MICROSCOPIC - Abnormal; Notable for the following:    Glucose, UA 100 (*)    Leukocytes, UA SMALL (*)    All other components within normal limits  CBC WITH DIFFERENTIAL/PLATELET - Abnormal; Notable for the following:    WBC 2.7 (*)    RBC 3.70 (*)    Hemoglobin 11.5 (*)    HCT 34.8 (*)    Neutro Abs 1.5 (*)    All other components within normal limits  BASIC METABOLIC PANEL - Abnormal; Notable for the following:    Glucose, Bld 104 (*)    GFR calc non Af Amer 77 (*)    GFR calc Af Amer 90 (*)    All other components within normal limits  URINE MICROSCOPIC-ADD ON    Imaging Review Dg Lumbar Spine Complete  10/18/2014   CLINICAL DATA:  UTI, back pain.  EXAM: LUMBAR SPINE - COMPLETE 4+ VIEW  COMPARISON:  CT 06/21/2007  FINDINGS: Degenerative disc disease diffusely throughout the visualized thoracolumbar spine. Degenerative facet disease throughout the lumbar spine. 8 mm of anterolisthesis of L3 on L4. 7 mm of anterolisthesis of L4 on L5. No fracture.  IMPRESSION: Diffuse degenerative disc disease and facet disease. No acute bony abnormality.   Electronically Signed   By: Rolm Baptise M.D.   On: 10/18/2014 16:11    Medications  traMADol (ULTRAM) tablet 50 mg (not administered)     MDM   Final diagnoses:  DDD (degenerative disc disease), lumbar    Suspect DDD as the etiology.  NV intact.  No uti.  Dc home with Donnie Mesa, MD 10/18/14 [id number]

## 2014-10-18 NOTE — ED Notes (Signed)
Patient called x1-no answer

## 2014-10-20 ENCOUNTER — Other Ambulatory Visit: Payer: Self-pay | Admitting: Family Medicine

## 2014-10-20 DIAGNOSIS — E119 Type 2 diabetes mellitus without complications: Secondary | ICD-10-CM

## 2014-10-27 ENCOUNTER — Other Ambulatory Visit: Payer: Self-pay | Admitting: Family Medicine

## 2014-10-27 DIAGNOSIS — I10 Essential (primary) hypertension: Secondary | ICD-10-CM

## 2014-11-05 DIAGNOSIS — H4011X1 Primary open-angle glaucoma, mild stage: Secondary | ICD-10-CM | POA: Diagnosis not present

## 2014-11-24 ENCOUNTER — Encounter: Payer: Self-pay | Admitting: Family Medicine

## 2014-11-24 ENCOUNTER — Ambulatory Visit (INDEPENDENT_AMBULATORY_CARE_PROVIDER_SITE_OTHER): Payer: Medicare Other | Admitting: Family Medicine

## 2014-11-24 VITALS — BP 159/64 | HR 90 | Temp 97.5°F | Wt 165.7 lb

## 2014-11-24 DIAGNOSIS — K5901 Slow transit constipation: Secondary | ICD-10-CM | POA: Insufficient documentation

## 2014-11-24 DIAGNOSIS — M5137 Other intervertebral disc degeneration, lumbosacral region: Secondary | ICD-10-CM | POA: Diagnosis present

## 2014-11-24 DIAGNOSIS — K5900 Constipation, unspecified: Secondary | ICD-10-CM | POA: Insufficient documentation

## 2014-11-24 DIAGNOSIS — R195 Other fecal abnormalities: Secondary | ICD-10-CM

## 2014-11-24 MED ORDER — TRAMADOL HCL 50 MG PO TABS: 50.0000 mg | ORAL_TABLET | Freq: Three times a day (TID) | ORAL | Status: DC | PRN

## 2014-11-24 MED ORDER — POLYETHYLENE GLYCOL 3350 17 GM/SCOOP PO POWD: 17.0000 g | Freq: Two times a day (BID) | ORAL | Status: DC | PRN

## 2014-11-24 NOTE — Patient Instructions (Signed)
Thank you for coming in, today!  I think your pain is a combination of arthritis and constipation. I want you to keep taking your stool softener. Start taking MiraLAX (polypropylene glycol) twice per day, for at least a week or two. If you have loose stools with it, drop back to once per day.  Take Tylenol for your back pain. You can take Aleve now and then if you need. I will give you a prescription for tramadol (the same pain medicine from the ED) for you to have if you absolutely need it.  Come back to see Dr. Raliegh Ip in 2-3 weeks. He may want to send you back to the stomach doctor at [facility], depending on how you feel. If you feel worse, or if you start having blood in your bowel movements, or if they stay very dark, call or come back sooner.  Please feel free to call with any questions or concerns at any time, at [phone]. --Dr. Venetia Maxon

## 2014-11-24 NOTE — Progress Notes (Signed)
   Subjective:    Patient ID: Hayley Fisher, female    DOB: 06/07/1931, 81 y.o.   MRN: [id number]  HPI: Pt presents to clinic for SDA for back pain for about 1 month; she has no specific injury or increased activity. She states she has not picked up anything heavy and has no pets, and has not picked up any children. She was seen in the ED late in February and had xrays that showed arthritis but no fracture. She was given tramadol, which helped, but she is now out. Aleve helps some, as well. She denies weight loss, night sweats. She denies blood in her stool but states "sometimes it's dark." She sometimes goes 3+ days without a BM. She does have constipation that she takes a stool softener, which helps sometimes; she does not take fiber or MiraLAX. She had a colonoscopy "somewhere around 2009" through [facility].  Review of Systems: As above.     Objective:   Physical Exam BP 159/64 mmHg  Pulse 115  Temp(Src) 97.5 F (36.4 C) (Oral)  Wt 165 lb 11.2 oz (75.161 kg) Manual recheck pulse ~90 Gen: well-appearing elderly adult female in NAD HEENT: [place]/AT, EOMI, PERRLA, MMM Cardio: RRR, no murmur Pulm: CTAB, no wheezes Abd: soft, nontender, BS+; moderately full but nondistended Ext: warm, well-perfused, no LE edema MSK: grossly normal appearance of spine, lumbosacral area, and hips bilaterally  No tenderness in midline over spinous processes of thoracic and lumbar areas  Minimal paraspinal lumbar tenderness  No bony prominence tenderness over sacrum / posterior pelvis  Negative sitting straight-leg lift test  Pt sits / stands / ambulates / changes positions without assistance, though slowly secondary to pain     Assessment & Plan:  81yo female with likely component of constipation on top of arthritic DDD spinal pain - history and exam not worrisome for neoplastic process, recent ED films show no fracture - pain managed reasonably with tramadol and Tylenol  Plan: - new Rx for MiraLAX BID and  continue OTC stool softener - refilled tramadol with instructions to use Tylenol primarily - defer further imaging or referral, for now - defer NSAIDs, given age - advised monitoring for continued "dark" stools or any blood in stool - advised f/u with PCP Dr. Parks Ranger in the relatively near future (2-3 weeks) for re-eval - if pain is persistent, or if there is change in bowel movement size / character, or any frank bleeding, would have low threshold for re-referral to [facility]  Note FYI to Dr. Henderson Baltimore, MD PGY-3, [facility] 11/25/2014, 12:08 AM

## 2014-11-25 ENCOUNTER — Encounter: Payer: Self-pay | Admitting: Family Medicine

## 2014-12-11 DIAGNOSIS — R339 Retention of urine, unspecified: Secondary | ICD-10-CM | POA: Diagnosis not present

## 2014-12-11 DIAGNOSIS — R351 Nocturia: Secondary | ICD-10-CM | POA: Diagnosis not present

## 2014-12-11 DIAGNOSIS — R350 Frequency of micturition: Secondary | ICD-10-CM | POA: Diagnosis not present

## 2014-12-11 DIAGNOSIS — N3020 Other chronic cystitis without hematuria: Secondary | ICD-10-CM | POA: Diagnosis not present

## 2014-12-12 ENCOUNTER — Ambulatory Visit (INDEPENDENT_AMBULATORY_CARE_PROVIDER_SITE_OTHER): Payer: Medicare Other | Admitting: Family Medicine

## 2014-12-12 ENCOUNTER — Encounter: Payer: Self-pay | Admitting: Family Medicine

## 2014-12-12 VITALS — BP 132/62 | HR 93 | Temp 98.2°F | Ht 63.0 in | Wt 161.5 lb

## 2014-12-12 DIAGNOSIS — E119 Type 2 diabetes mellitus without complications: Secondary | ICD-10-CM

## 2014-12-12 DIAGNOSIS — E7800 Pure hypercholesterolemia, unspecified: Secondary | ICD-10-CM

## 2014-12-12 DIAGNOSIS — I10 Essential (primary) hypertension: Secondary | ICD-10-CM

## 2014-12-12 DIAGNOSIS — E1149 Type 2 diabetes mellitus with other diabetic neurological complication: Secondary | ICD-10-CM

## 2014-12-12 DIAGNOSIS — E1140 Type 2 diabetes mellitus with diabetic neuropathy, unspecified: Secondary | ICD-10-CM

## 2014-12-12 LAB — LIPID PANEL
Cholesterol: 191 mg/dL (ref 0–200)
HDL: 82 mg/dL (ref >=46)
LDL Cholesterol: 95 mg/dL (ref 0–99)
TRIGLYCERIDES: 69 mg/dL (ref <150)
Total CHOL/HDL Ratio: 2.3 Ratio
VLDL: 14 mg/dL (ref 0–40)

## 2014-12-12 LAB — POCT GLYCOSYLATED HEMOGLOBIN (HGB A1C): Hemoglobin A1C: 9.1

## 2014-12-12 MED ORDER — HYDROCHLOROTHIAZIDE 12.5 MG PO TABS: 12.5000 mg | ORAL_TABLET | Freq: Every day | ORAL | Status: DC

## 2014-12-12 MED ORDER — ZOSTER VACCINE LIVE 19400 UNT/0.65ML ~~LOC~~ SOLR: 0.6500 mL | Freq: Once | SUBCUTANEOUS | Status: DC

## 2014-12-12 MED ORDER — INSULIN GLARGINE 100 UNIT/ML ~~LOC~~ SOLN: SUBCUTANEOUS | Status: DC

## 2014-12-12 NOTE — Assessment & Plan Note (Addendum)
Systolic BP of approximately 170 at urologist's office on 12/11/2014. 132/62 noted on manual measurement at current visit. Patient reports polyuria which is likely due to a combination glucosuria due to a one week history of CBGs in the 200- 300 range and a UTI currently treated with Cephalexin. Anticipate that blood pressure will rise with improved diabetic control. - prescribed HCTZ  Dois Davenport, MS3  Agree plus recheck in 6 weeks.

## 2014-12-12 NOTE — Assessment & Plan Note (Signed)
Mild but present.  Good foot care

## 2014-12-12 NOTE — Progress Notes (Signed)
Subjective:     Patient ID: Hayley Fisher, female   DOB: 03-Apr-1931, 81 y.o.   MRN: [id number]  HPI Hayley Fisher is an 81 year old female with a past medical history significant for Type II DM and HTN who presents today for follow-up.   HTN: Her blood pressure was elevated to approximately 174 systolic yesterday at her urologist's office and they suggested she make an appointment. She denies being nervous or having exerted herself before this measurement. She is currently taking 20mg  enalapril daily. Her blood pressure has fluctuated in the past-- max reading at this clinic is 159/64 on 11/24/2014. She denies chest pain, shortness of breath, and vision changes.  Type II DM: Over the past week, her glucometer readings have been higher than normal in the 200-300 range. Morning readings this week before eating have been in the 200s, mid-day readings before lunch have been in the 300s, and after dinner readings in the 200s. She has not noted any low blood glucose. She is taking 8 units of Lantus in the morning. She endorses polyuria and numbness/tingling in her toes. She denies vision changes, lightheadedness, sweating, and fatigue suggestive of hypoglycemia.   She is currently on Cephalexin prescribed by her urologist for a UTI.   Health Maintenance: Due for lipid panel, diabetic foot exam, and shingles vaccine.   Review of Systems See HPI.      Objective:   Physical Exam  BP 132/62 mmHg  Pulse 93  Temp(Src) 98.2 F (36.8 C) (Oral)  Ht 5\' 3"  (1.6 m)  Wt 161 lb 8 oz (73.256 kg)  BMI 28.62 kg/m2 Gen: well-appearing in NAD CV: RRR, no MRG, nl S1 and S2, 1+ pitting edema bilaterally Pulm: CTA bilaterally, no wheezes or crackles Extremities: DP and posterior tibial pulses present bilaterally. No lesions noted. No sensation to monofilament testing over dorsal surface of the metatarsophalangeal joints bilaterally. Intact to monofilament testing elsewhere.     Assessment:     Please see Problem  List.     Plan:     Please see Problem List.

## 2014-12-12 NOTE — Patient Instructions (Addendum)
I added a mild fluid pill, hydrocholorthiazide better known as HCTZ for your blood pressure. I sent in a prescription for the shingles vaccine.  Hopefully, Medicaid will pay for it.  Please let us know if you get the vaccine so we can update your records.   Take really good care of your feet.  You  I want you to increase your lantus 1 unit every day based on any morning blood sugar above 150.  See Dr. Raliegh Ip again in 6 weeks.

## 2014-12-12 NOTE — Progress Notes (Deleted)
Subjective:     Patient ID: Hayley Fisher, female   DOB: 02-Jul-1931, 81 y.o.   MRN: [id number] Written by: Dois Davenport, MS3  HPI Mrs. Voigt is an 81 year old female with a past medical history significant for Type II DM and HTN who presents today for follow-up.   HTN: Her blood pressure was elevated to approximately 831 systolic yesterday at her urologist's office and they suggested she make an appointment. She denies being nervous or having exerted herself before this measurement. She is currently taking 20mg  enalapril daily. Her blood pressure has fluctuated in the past-- max reading at this clinic is 159/64 on 11/24/2014. She denies chest pain, shortness of breath, and vision changes.  Type II DM: Over the past week, her glucometer readings have been higher than normal in the 200-300 range. Morning readings this week before eating have been in the 200s, mid-day readings before lunch have been in the 300s, and after dinner readings in the 200s. She has not noted any low blood glucose. She is taking 8 units of Lantus in the morning. She endorses polyuria and numbness/tingling in her toes. She denies vision changes, lightheadedness, sweating, and fatigue suggestive of hypoglycemia.   She is currently on Cephalexin prescribed by her urologist for a UTI.   Health Maintenance: Due for lipid panel, diabetic foot exam, and shingles vaccine.    Review of Systems  See HPI     Objective:   Physical Exam BP 148/76 mmHg  Pulse 93  Temp(Src) 98.2 F (36.8 C) (Oral)  Ht 5\' 3"  (1.6 m)  Wt 161 lb 8 oz (73.256 kg)  BMI 28.62 kg/m2 Gen: well-appearing, NAD CV: RRR, no MRG, nl S1 and S2 Physical Exam    Assessment:     Please see Problem List.     Plan:     Please see Problem List.

## 2014-12-12 NOTE — Progress Notes (Signed)
   Subjective:    Patient ID: Hayley Fisher, female    DOB: June 12, 1931, 81 y.o.   MRN: [id number]  HPI  I saw this patient and personally repeated the elements of the history and physical by med student Mounsey. Patient has high home blood sugars and an A1C documenting poor control BP is borderline in the office but has trend of being elevated.  Review of Systems     Objective:   Physical Exam Lungs clear Cardiac RRR without m or g Diabetic foot exam done and some neuropathy.       Assessment & Plan:

## 2014-12-12 NOTE — Assessment & Plan Note (Addendum)
Foot exam completed. Instructed on foot care. Increase lantus, see instructions.

## 2014-12-15 ENCOUNTER — Encounter: Payer: Self-pay | Admitting: Family Medicine

## 2015-01-02 DIAGNOSIS — R339 Retention of urine, unspecified: Secondary | ICD-10-CM | POA: Diagnosis not present

## 2015-01-02 DIAGNOSIS — N3020 Other chronic cystitis without hematuria: Secondary | ICD-10-CM | POA: Diagnosis not present

## 2015-01-23 ENCOUNTER — Encounter: Payer: Self-pay | Admitting: Family Medicine

## 2015-01-23 ENCOUNTER — Ambulatory Visit (INDEPENDENT_AMBULATORY_CARE_PROVIDER_SITE_OTHER): Payer: Medicare Other | Admitting: Family Medicine

## 2015-01-23 VITALS — BP 123/91 | HR 91 | Temp 98.0°F | Ht 63.0 in | Wt 163.1 lb

## 2015-01-23 DIAGNOSIS — M7918 Myalgia, other site: Secondary | ICD-10-CM

## 2015-01-23 DIAGNOSIS — M25551 Pain in right hip: Secondary | ICD-10-CM | POA: Diagnosis present

## 2015-01-23 DIAGNOSIS — M791 Myalgia: Secondary | ICD-10-CM | POA: Diagnosis not present

## 2015-01-23 DIAGNOSIS — G5701 Lesion of sciatic nerve, right lower limb: Secondary | ICD-10-CM | POA: Insufficient documentation

## 2015-01-23 MED ORDER — TRAMADOL HCL 50 MG PO TABS: 50.0000 mg | ORAL_TABLET | Freq: Three times a day (TID) | ORAL | Status: DC | PRN

## 2015-01-23 NOTE — Progress Notes (Signed)
   Subjective:    Patient ID: Hayley Fisher, female    DOB: [birth year], 81 y.o.   MRN: [id number]  HPI: Pt presents to clinic for SDA for right hip / leg pain, present for about 1 month, worse with standing and walking. She has had similar pain in the past, which was back pain; she was seen at the ED in February and had a lumbar xray which showed diffuse arthritis. She stumbled and fell back in March and fell backwards onto her bottom, but she did not have any pain at that point. Pain is rated 10/10, located in the back of her leg "under her bottom" around the side of her hip and into the front of her hip, with some radiation down past her knee but not all the way to her ankle. She denies frank paresthesias or weakness in either leg. She has taken Tylenol, which hasn't helped much.  Review of Systems: As above. Denies chest pain, SOB, fever / chills, N/V, abdominal pain, loss of weight. She has no constipation or urinary-type symptoms.     Objective:   Physical Exam BP 123/91 mmHg  Pulse 91  Temp(Src) 98 F (36.7 C) (Oral)  Ht 5\' 3"  (1.6 m)  Wt 163 lb 1.6 oz (73.982 kg)  BMI 28.90 kg/m2 Gen: well-appearing elderly adult female in NAD HEENT: [place]/AT, EOMI, PERRLA, MMM Cardio: RRR, no murmur appreciated Pulm: CTAB, no wheezes, normal WOB Abd: soft, nontender, BS+ Ext: warm, well-perfused, no LE edema MSK: right hip with diffuse mild bony tenderness but no tenderness over the greater trochanter  Normal ROM in flexion / extension (slightly more pain with extension) and internal / external rotation  More prominent pain with direct palpation over the piriformis on the right  Negative sitting straight-leg lift test for radicular-type symptoms Neuro: alert, oriented, gait obviously antalgic and slow, walks with cane  Strength 4+/5 in bilateral lower extremities (resisted extension especially increases pain in hip)     Assessment & Plan:  81yo female with known severe degenerative low back  arthritis presenting with right hip pain, likely osteoarthritis with possible piriformis muscle pain - strongly doubt frank fracture given fall was 3+ months ago and she had no pain at the time - doubt malignant process with no weight loss or other systemic symptoms - uncertain etiology for piriformis strain if this is present  Plan: - discussed various options for work-up treatment with pt, including xrays today, referral to specialist care, etc - pt opts for referral to orthopedics and xray at that time - Rx for tramadol for pain and Aleve for inflammation, in the meantime - pt with scheduled PCP Dr. Parks Ranger f/u on 6/15; will keep that appt or call back or return to clinic before then if needed  Note FYI to Dr. Demetrios Isaacs, MD PGY-3, [facility] 01/23/2015, 2:46 PM

## 2015-01-23 NOTE — Patient Instructions (Signed)
Thank you for coming in, today!  I think you have an irritated muscle in your hip and you probably also have arthritis in that hip. I will refer you to the orthopedic doctor -- they will do xrays there. Our office or their office will call you with an appointment date and time and can give you directions how to get there. I'm not sure which orthopedic office it will be.  You can take tramadol for pain and Aleve to help with inflammation, in the meantime. It's safe to take both of these medicines together.  Come back to see Dr. Raliegh Ip as scheduled, or sooner if you need. Please feel free to call with any questions or concerns at any time, at [phone]. --Dr. Venetia Maxon

## 2015-02-04 ENCOUNTER — Encounter: Payer: Self-pay | Admitting: Family Medicine

## 2015-02-04 ENCOUNTER — Ambulatory Visit (INDEPENDENT_AMBULATORY_CARE_PROVIDER_SITE_OTHER): Payer: Medicare Other | Admitting: Family Medicine

## 2015-02-04 VITALS — BP 152/90 | HR 101 | Temp 98.5°F | Ht 63.0 in | Wt 163.2 lb

## 2015-02-04 DIAGNOSIS — M5137 Other intervertebral disc degeneration, lumbosacral region: Secondary | ICD-10-CM

## 2015-02-04 DIAGNOSIS — G5701 Lesion of sciatic nerve, right lower limb: Secondary | ICD-10-CM | POA: Diagnosis not present

## 2015-02-04 DIAGNOSIS — N390 Urinary tract infection, site not specified: Secondary | ICD-10-CM

## 2015-02-04 DIAGNOSIS — I10 Essential (primary) hypertension: Secondary | ICD-10-CM | POA: Diagnosis not present

## 2015-02-04 MED ORDER — TRAMADOL HCL 50 MG PO TABS: 50.0000 mg | ORAL_TABLET | Freq: Three times a day (TID) | ORAL | Status: DC | PRN

## 2015-02-04 NOTE — Patient Instructions (Signed)
Dear Hayley Fisher, Thank you for coming in to clinic today. It was good to see you!  1. For your Low Back and Hips - It sounds like this is caused by your Arthritis and Muscle Spasms - this can lead to some nerve irritation with pain down your legs.  - Refilled Tramadol - take one tablet every 8 hours (or up to 3 times a day), try the 2-3x daily dosing for 1-2 weeks then can use more as needed - Continue over the counter Aleve 250mg  tabs - take 2 of these (with food) twice daily (morning and night) for 1-2 weeks, then Stop or reduce to 1 tab every 12 hours as needed - Try OTC Icy Hot spray or rub, continue heating pad 2. Follow-up with Orthopedics on Friday 6/17 - they should get X-rays 3. For your Urinary Symptoms - I don't think you have an infection - Continue the antibiotic for prevention. Call [facility] and ask to see a different one of their doctors if you would like a second opinion  Some important numbers from today's visit: BP - 152/90 Results -  Please schedule a follow-up appointment with Dr. Parks Ranger in 1 to 2 months for Diabetes / Blood Pressure, and can follow-up Back Pain  If you have any other questions or concerns, please feel free to call the clinic to contact me. You may also schedule an earlier appointment if necessary.  However, if your symptoms get significantly worse, please go to the Emergency Department to seek immediate medical attention.  Hayley Fisher, [name]

## 2015-02-04 NOTE — Progress Notes (Signed)
   Subjective:    Patient ID: Hayley Fisher, female    DOB: 03/12/1931, 81 y.o.   MRN: [id number]  HPI  BACK PAIN, acute on chronic osteoarthritis: - Last [facility] visit 01/23/15 for chronic LBP and R-hip pain, known history of severe degenerative OA, thought to be combination of OA + piriformis syndrome with gluteal MSK pain bilaterally - Has scheduled Ortho apt on Friday 02/06/15, did not have X-rays previously (decision was to get X-rays at ortho office) - Describes same pain as previously, stable without worsening, initial 10/10 since improved now about 6/10 on flares and improves to 2-3/10 with Tramadol and rest, describes bilateral lower back with some radiation bilaterally to buttocks/piriformis region, and radiates sometimes below her knees but not to her toes. - Taking Tramadol 50mg  q 8 hours (taking 1-2x daily) with significant relief. No longer taking regular Tylenol (minimal relief), but does take OTC Aleve 220 vs 250mg  PRN (not regularly and BID) also with significant improvement. - Ambulates with cane, improved since last visit - Denies any fevers/chills, n/v, falls / trauma / injury, leg weakness, numbness or tingling, saddle anesthesia, bladder / bowel incontinence or urinary retention  URINARY FREQUENCY / ODOR: - Followed by Dr. Matilde Sprang at [facility], h/o bladder suspension/tack surgery - Recent course with UTI symptoms in 08/2014 treated with Keflex with improvement and then intermittent worsening. She had followed up in interval with Urology, given course of Cipro for repeat UTI, and then started on Trimethoprim100mg  daily for UTI prophylaxis. - Currently states that her urinary symptoms have mostly resolved with exception of urinary odor. Improved frequency. - Denies any abd pain, dysuria, flank pain, hematuria, n/v  CHRONIC HTN: Reports - no new concerns. No recent home BP measurements. Current Meds - Enalapril 20mg  daily, HCTZ 12.5mg  daily   Reports good compliance, took  meds today. Tolerating well, w/o complaints. Denies CP, dyspnea, HA, edema, dizziness / lightheadedness  I have reviewed and updated the following as appropriate: allergies and current medications  Social Hx: - Former smoker  Review of Systems  See above HPI    Objective:   Physical Exam  BP 152/90 mmHg  Pulse 101  Temp(Src) 98.5 F (36.9 C) (Oral)  Ht 5\' 3"  (1.6 m)  Wt 163 lb 3.2 oz (74.027 kg)  BMI 28.92 kg/m2  Gen - well-appearing, NAD HEENT - MMM Heart - Mildly tachycardic, regular rhythm, no murmurs Abd - soft, resolved suprapubic tenderness, NTND, no masses, no guarding, +active BS MSK - Back - no deformity or spinous process tenderness. mild bilateral lower Lumbar paraspinal muscle +TTP and muscle hypertonicity/spasm. No CVAT. Seated SLR negative for radicular symptoms. Also +TTP mildly L>R lower gluteal piriformis region Skin - warm, dry, no rashes Neuro - awake, alert, oriented. Muscle str intact 5/5 grip and ankle dorsiflexion, intact distal sensation     Assessment & Plan:   See specific A&P problem list for details.

## 2015-02-05 NOTE — Assessment & Plan Note (Addendum)
Stable to mildly improved acute on chronic LBP, likely secondary to severe known L-spine OA, also current flare with presumed piriformis syndrome spasm with some intermittent sciatic symptoms - No recent imaging - Recently referred to Ortho - to f/u on 6/17 for imaging and eval - Pain improved on Tramadol PRN  Plan: 1. Continue current supportive care / symptom management 2. Refilled Tramadol x 1 month 3. Continue OTC Aleve at 500mg  BID x 1-2 weeks (stable SCr, renal function, no h/o GIB) for short course then can do PRN. May resume Tylenol, recommend topical icy hot and heating pad 4. Follow-up with Ortho on 6/17 5. RTC 1-3 months for LBP - consider adding Baclofen for spasms if persistence

## 2015-02-05 NOTE — Assessment & Plan Note (Signed)
Stable without acute UTI, just +urinary odor. Resolved symptoms - Followed by Dr. Matilde Sprang ([facility]) - Abx - s/p Keflex, Cipro within past 6 months  Plan: 1. Continue Trimethoprim 100mg  daily - UTI prophylaxis per Urology 2. Advised pt to continue f/u with Urology, may ask to see different provider if wants second opinion from their office

## 2015-02-05 NOTE — Assessment & Plan Note (Signed)
See Lumbar DDD above - Consider adding Gabapentin for sciatic pain if worsening (also known h/o peripheral DM neuropathy)

## 2015-02-05 NOTE — Assessment & Plan Note (Signed)
Elevated BP today (goal < 140/90), despite previously added HCTZ 12.5mg  - however likely mild inc BP due to acute LBP. No complications   Plan:  1. No change to meds today - continue Enalapril 20mg  daily, HCTZ 12.5mg  daily 2. Not due for labs - last SCr 0.75 (09/2014) 3. Lifestyle Mods - stay active, Dec salt intake, inc K+ rich vegs 4. Monitor BP at home or at drug store occasionally. 5. Consider titrate up HCTZ to 25mg  daily in future if BP remains elevated

## 2015-02-06 DIAGNOSIS — M545 Low back pain: Secondary | ICD-10-CM | POA: Diagnosis not present

## 2015-02-16 DIAGNOSIS — R339 Retention of urine, unspecified: Secondary | ICD-10-CM | POA: Diagnosis not present

## 2015-02-16 DIAGNOSIS — N3020 Other chronic cystitis without hematuria: Secondary | ICD-10-CM | POA: Diagnosis not present

## 2015-03-06 DIAGNOSIS — M545 Low back pain: Secondary | ICD-10-CM | POA: Diagnosis not present

## 2015-03-09 ENCOUNTER — Ambulatory Visit: Payer: Medicare Other | Attending: Orthopaedic Surgery | Admitting: Physical Therapy

## 2015-03-09 DIAGNOSIS — R209 Unspecified disturbances of skin sensation: Secondary | ICD-10-CM

## 2015-03-09 DIAGNOSIS — R293 Abnormal posture: Secondary | ICD-10-CM | POA: Diagnosis not present

## 2015-03-09 DIAGNOSIS — R29898 Other symptoms and signs involving the musculoskeletal system: Secondary | ICD-10-CM

## 2015-03-09 DIAGNOSIS — M5441 Lumbago with sciatica, right side: Secondary | ICD-10-CM | POA: Diagnosis not present

## 2015-03-09 DIAGNOSIS — M5442 Lumbago with sciatica, left side: Secondary | ICD-10-CM | POA: Diagnosis not present

## 2015-03-09 DIAGNOSIS — M6289 Other specified disorders of muscle: Secondary | ICD-10-CM | POA: Insufficient documentation

## 2015-03-09 DIAGNOSIS — R208 Other disturbances of skin sensation: Secondary | ICD-10-CM | POA: Insufficient documentation

## 2015-03-09 NOTE — Therapy (Signed)
[place], [place], [address] Phone: [phone]   Fax:  [phone]  Physical Therapy Evaluation  Patient Details  Name: Hayley Fisher MRN: [id number] Date of Birth: [birth year] Referring Provider:  Melrose Nakayama, MD  Encounter Date: 03/09/2015      PT End of Session - 03/09/15 1316    Visit Number 1   Number of Visits 12   Date for PT Re-Evaluation 04/24/15   PT Start Time 1020   PT Stop Time 1110   PT Time Calculation (min) 50 min   Activity Tolerance Patient tolerated treatment well      Past Medical History  Diagnosis Date  . Hypertension   . Hyperlipidemia   . Diabetes mellitus   . Arthritis     Past Surgical History  Procedure Laterality Date  . Bladder tacking    . Bladder suspension      There were no vitals filed for this visit.  Visit Diagnosis:  Bilateral low back pain with sciatica, sciatica laterality unspecified  Weakness of both hips  Abnormal posture  Sensory disturbance      Subjective Assessment - 03/09/15 1024    Subjective Patient complains of back pain and legs/hips, sensory disturbance in bilateral LEs, difficulty walking, back/ legs feel "tired".    R LE more painful than L LE.  She    Limitations Sitting;Lifting;Standing;Walking;House hold activities   How long can you sit comfortably? gets stiff   How long can you stand comfortably? 30 min    Patient Stated Goals to be able to walk and stand longer and work in my flowers longer, walk without the stick   Currently in Pain? Yes   Pain Score 6    Pain Location Back   Pain Orientation Posterior;Right;Left;Lower   Pain Descriptors / Indicators Nagging;Aching   Pain Type Chronic pain   Pain Radiating Towards post thighs and down back of calves   Pain Onset More than a month ago   Pain Frequency Intermittent   Aggravating Factors  bending, activity   Pain Relieving Factors positioning, meds   Effect of  Pain on Daily Activities keeps her from doing all she likes to do   Multiple Pain Sites No            [facility] PT Assessment - 03/09/15 1034    Assessment   Medical Diagnosis DDD   Onset Date/Surgical Date 10/09/14   Prior Therapy long ago   Precautions   Precautions None   Restrictions   Weight Bearing Restrictions No   Balance Screen   Has the patient fallen in the past 6 months Yes   How many times? 1  tripped over her shoe   Has the patient had a decrease in activity level because of a fear of falling?  Yes   Is the patient reluctant to leave their home because of a fear of falling?  No   Home Environment   Living Environment Private residence   Living Arrangements Other relatives   Home Access Stairs to enter   Entrance Stairs-Number of Steps 3   Entrance Stairs-Rails Left   Additional Comments uses cane    Prior Function   Level of Independence Independent   Vocation Retired   Leisure gardening   Cognition   Overall Cognitive Status Within Functional Limits for tasks assessed   Observation/Other Assessments   Focus on Therapeutic Outcomes (FOTO)  53%   Sensation   Light Touch Appears Intact  Additional Comments reports sensory changes in bilateral LEs   Coordination   Gross Motor Movements are Fluid and Coordinated Not tested   Posture/Postural Control   Posture/Postural Control Postural limitations   Postural Limitations Right pelvic obliquity;Flexed trunk;Weight shift left   Posture Comments high Rt. shoudler and high Rt. hip knees flexed  leans L   AROM   Lumbar Flexion WNL   Lumbar Extension 80-90 %  limited by pain   Lumbar - Right Side Bend WNL, felt good stretch on L    Lumbar - Left Side Bend can reach past the knee   Lumbar - Right Rotation 75%   Lumbar - Left Rotation 10-15%    Strength   Right Hip Flexion 3+/5   Right Hip ABduction 3-/5   Left Hip Flexion 3+/5   Left Hip ABduction 3/5   Right Knee Flexion 5/5   Right Knee Extension 5/5    Left Knee Flexion 3+/5   Left Knee Extension 4+/5   Right Ankle Dorsiflexion 4+/5   Left Ankle Dorsiflexion 4/5   Palpation   Palpation comment hypomobile and soreness in lumbar spine, no soreness in gluteals            PT Education - 03/09/15 1105    Education provided Yes   Education Details PT/POC, HEP, pain vs stretch, posture, MHP   Person(s) Educated Patient   Methods Explanation;Demonstration;Handout   Comprehension Verbalized understanding;Returned demonstration;Need further instruction             PT Long Term Goals - 03/09/15 1549    PT LONG TERM GOAL #1   Title Pt will be I with HEP as of last visit for core, hip and low back   Time 6   Period Weeks   Status New   PT LONG TERM GOAL #2   Title Pt will score <40% on FOTO to demo functional improvement   Time 6   Period Weeks   Status New   PT LONG TERM GOAL #3   Title Pt will be able do sit for 45 min for meals with min discomfort in low back   Time 6   Period Weeks   Status New   PT LONG TERM GOAL #4   Title Pt will be able to demo corrected standing posture without cueing   Time 6   Period Weeks   Status New   PT LONG TERM GOAL #5   Title Pt will be able to return to community exercise with no lasting increase in pain    Time 6   Period Weeks   Status New               Plan - 03/09/15 1318    Clinical Impression Statement This patient presents with signs and symptoms of degenerative process in spine, with abnormal posture, hip/core weakness and difficulty with all aspects of mobility.  She will benefit from skilled PT to improve functional mobility.    Pt will benefit from skilled therapeutic intervention in order to improve on the following deficits Abnormal gait;Decreased range of motion;Difficulty walking;Increased fascial restricitons;Impaired UE functional use;Decreased endurance;Pain;Decreased balance;Hypomobility;Impaired flexibility;Postural dysfunction;Impaired sensation;Decreased  strength;Decreased mobility;Decreased cognition   Rehab Potential Good   PT Frequency 2x / week   PT Duration 6 weeks   PT Treatment/Interventions ADLs/Self Care Home Management;Electrical Stimulation;Cryotherapy;Gait training;Stair training;DME Instruction;Moist Heat;Traction;Ultrasound;Functional mobility training;Therapeutic activities;Therapeutic exercise;Balance training;Neuromuscular re-education;Manual techniques;Passive range of motion;Patient/family education   PT Next Visit Plan check HEP given today, NuStep, modalities/manual for  pain    PT Home Exercise Plan knee to chest, hamstring, LTR and hip abd   Consulted and Agree with Plan of Care Patient          G-Codes - 03-24-2015 1554    Functional Assessment Tool Used FOTO   Functional Limitation Mobility: Walking and moving around   Mobility: Walking and Moving Around Current Status [phone]) At least 40 percent but less than 60 percent impaired, limited or restricted   Mobility: Walking and Moving Around Goal Status [phone]) At least 20 percent but less than 40 percent impaired, limited or restricted       Problem List Patient Active Problem List   Diagnosis Date Noted  . Piriformis syndrome of right side 01/23/2015  . DDD (degenerative disc disease), lumbosacral 11/24/2014  . Constipation 11/24/2014  . Dark stools 11/24/2014  . Acute lower UTI 07/03/2014  . Sciatic pain 01/01/2014  . Cough 01/31/2013  . Tenesmus (rectal) 12/21/2012  . Recurrent UTI 10/29/2012  . Groin pain 08/06/2012  . Vertigo-Dizzy, vague feeling 11/16/2011  . Heart palpitations 11/16/2011  . ONYCHOMYCOSIS, TOENAILS 04/03/2009  . Diabetic neuropathy associated with type 2 diabetes mellitus 04/03/2009  . ALOPECIA 11/02/2007  . COLONIC POLYPS, HYPERPLASTIC 02/16/2007  . Type 2 diabetes mellitus with diabetic neuropathy 10/19/2006  . Pure hypercholesterolemia 10/19/2006  . OBESITY, NOS 10/19/2006  . GLAUCOMA 10/19/2006  . HYPERTENSION, BENIGN  SYSTEMIC 10/19/2006  . OSTEOARTHRITIS, LOWER LEG 10/19/2006  . OSTEOPENIA 10/19/2006    Neri Vieyra 24-Mar-2015, 3:56 PM  [facility] [address] [place], [place], [address] Phone: [phone]   Fax:  [phone]   Raeford Razor, PT 03/24/15 3:56 PM Phone: [phone] Fax: [phone]

## 2015-03-09 NOTE — Patient Instructions (Signed)
Abduction: Side Leg Lift (Eccentric) - Side-Lying  1-2 Lie on side. Lift top leg slightly higher than shoulder level. Keep top leg straight with body, toes pointing forward. Slowly lower for 3-5 seconds. _10__ reps per set, __1-2_ sets per day, __5_ days per week.   Copyright  VHI. All rights reserved.      Lower Trunk Rotation Stretch   Keeping back flat and feet together, rotate knees to left side. Hold ___10-15_ seconds. Repeat _10___ times per set. Do __1-2__ sets per session. Do ___2_ sessions per day.  http://orth.exer.us/123   Copyright  VHI. All rights reserved.  Hamstring Stretch - Supine   Lie on back, uninvolved leg raised toward ceiling, towel around knee. Pull thigh toward chest until a stretch is felt on back of thigh. Hold for __20-30_ seconds. If possible, move towel up to calf and repeat. Repeat on involved leg. Repeat 3___ times. Do _2__ times per day.  Copyright  VHI. All rights reserved.  Knee to Chest   Lying supine, bend involved knee to chest _3__ times. Repeat with other leg. CAN ALSO PULL KNEE TO OPPOSITE SHOULDER Do __2_ times per day.  Copyright  VHI. All rights reserved.

## 2015-03-10 ENCOUNTER — Ambulatory Visit: Payer: Medicare Other | Admitting: Physical Therapy

## 2015-03-10 DIAGNOSIS — M5441 Lumbago with sciatica, right side: Secondary | ICD-10-CM | POA: Diagnosis not present

## 2015-03-10 DIAGNOSIS — R293 Abnormal posture: Secondary | ICD-10-CM | POA: Diagnosis not present

## 2015-03-10 DIAGNOSIS — R29898 Other symptoms and signs involving the musculoskeletal system: Secondary | ICD-10-CM

## 2015-03-10 DIAGNOSIS — R208 Other disturbances of skin sensation: Secondary | ICD-10-CM | POA: Diagnosis not present

## 2015-03-10 DIAGNOSIS — M5442 Lumbago with sciatica, left side: Principal | ICD-10-CM

## 2015-03-10 DIAGNOSIS — M6289 Other specified disorders of muscle: Secondary | ICD-10-CM | POA: Diagnosis not present

## 2015-03-10 DIAGNOSIS — R209 Unspecified disturbances of skin sensation: Secondary | ICD-10-CM

## 2015-03-10 NOTE — Therapy (Signed)
[place], [place], [address] Phone: [phone]   Fax:  [phone]  Physical Therapy Treatment  Patient Details  Name: Hayley Fisher MRN: [id number] Date of Birth: 1930/09/22 Referring Provider:  [name] *  Encounter Date: 03/10/2015      PT End of Session - 03/10/15 1614    Visit Number 2   Number of Visits 12   Date for PT Re-Evaluation 04/24/15   PT Start Time 8299   PT Stop Time 3716   PT Time Calculation (min) 56 min   Activity Tolerance Patient tolerated treatment well      Past Medical History  Diagnosis Date  . Hypertension   . Hyperlipidemia   . Diabetes mellitus   . Arthritis     Past Surgical History  Procedure Laterality Date  . Bladder tacking    . Bladder suspension      There were no vitals filed for this visit.  Visit Diagnosis:  Bilateral low back pain with sciatica, sciatica laterality unspecified  Weakness of both hips  Abnormal posture  Sensory disturbance      Subjective Assessment - 03/10/15 1555    Subjective Felt a little better after the treatment.    Currently in Pain? Yes   Pain Score 6    Pain Location Back   Pain Type Chronic pain            [facility] PT Assessment - 03/09/15 1034    Assessment   Medical Diagnosis DDD   Onset Date/Surgical Date 10/09/14   Prior Therapy long ago   Precautions   Precautions None   Restrictions   Weight Bearing Restrictions No   Balance Screen   Has the patient fallen in the past 6 months Yes   How many times? 1  tripped over her shoe   Has the patient had a decrease in activity level because of a fear of falling?  Yes   Is the patient reluctant to leave their home because of a fear of falling?  No   Home [employer] residence   Living Arrangements Other relatives   Home Access Stairs to enter   Entrance Stairs-Number of Steps 3   Entrance Stairs-Rails Left   Additional Comments uses cane    Prior Function   Level of Independence Independent   Vocation Retired   Leisure gardening   Cognition   Overall Cognitive Status Within Functional Limits for tasks assessed   Observation/Other Assessments   Focus on Therapeutic Outcomes (FOTO)  53%   Sensation   Light Touch Appears Intact   Additional Comments reports sensory changes in bilateral LEs   Coordination   Gross Motor Movements are Fluid and Coordinated Not tested   Posture/Postural Control   Posture/Postural Control Postural limitations   Postural Limitations Right pelvic obliquity;Flexed trunk;Weight shift left   Posture Comments high Rt. shoudler and high Rt. hip knees flexed  leans L   AROM   Lumbar Flexion WNL   Lumbar Extension 80-90 %  limited by pain   Lumbar - Right Side Bend WNL, felt good stretch on L    Lumbar - Left Side Bend can reach past the knee   Lumbar - Right Rotation 75%   Lumbar - Left Rotation 10-15%    Strength   Right Hip Flexion 3+/5   Right Hip ABduction 3-/5   Left Hip Flexion 3+/5   Left Hip ABduction 3/5   Right Knee  Flexion 5/5   Right Knee Extension 5/5   Left Knee Flexion 3+/5   Left Knee Extension 4+/5   Right Ankle Dorsiflexion 4+/5   Left Ankle Dorsiflexion 4/5   Palpation   Palpation comment hypomobile and soreness in lumbar spine, no soreness in gluteals                     [facility] Adult PT Treatment/Exercise - 03/10/15 1558    Transfers   Transfers Sit to Stand   Sit to Stand 5: Supervision  no UE support   Sit to Stand Details Tactile cues for weight shifting   Comments used mirror once in standing to correct lateral lean to L    Lumbar Exercises: Stretches   Active Hamstring Stretch 3 reps   Single Knee to Chest Stretch 3 reps;10 seconds   Lower Trunk Rotation Other (comment)   Lower Trunk Rotation Limitations 10 reps   Lumbar Exercises: Aerobic   Stationary Bike NuStep L5 for 5 min    Lumbar Exercises: Seated    Other Seated Lumbar Exercises used bolster to stretch trunk 3x 20 sec each side, cues to elongate trunk    Lumbar Exercises: Sidelying   Hip Abduction 10 reps   Hip Abduction Weights (lbs) 2 sets   Other Sidelying Lumbar Exercises QL stretch with towel roll to stretch L side for cryo   Cryotherapy   Number Minutes Cryotherapy 10 Minutes   Cryotherapy Location Lumbar Spine   Type of Cryotherapy Ice pack                PT Education - 03/10/15 1613    Education provided Yes   Education Details HEP reinforced   Person(s) Educated Patient   Methods Explanation   Comprehension Verbalized understanding;Returned demonstration             PT Long Term Goals - 03-12-2015 1549    PT LONG TERM GOAL #1   Title Pt will be I with HEP as of last visit for core, hip and low back   Time 6   Period Weeks   Status New   PT LONG TERM GOAL #2   Title Pt will score <40% on FOTO to demo functional improvement   Time 6   Period Weeks   Status New   PT LONG TERM GOAL #3   Title Pt will be able do sit for 45 min for meals with min discomfort in low back   Time 6   Period Weeks   Status New   PT LONG TERM GOAL #4   Title Pt will be able to demo corrected standing posture without cueing   Time 6   Period Weeks   Status New   PT LONG TERM GOAL #5   Title Pt will be able to return to community exercise with no lasting increase in pain    Time 6   Period Weeks   Status New               Plan - 03/10/15 1634    Clinical Impression Statement Patient was evaluated yesterday, tolerated exercises well, fatigued.    PT Next Visit Plan progress as tolerated, assess standing posture and self-correction (?lateral shift)   PT Home Exercise Plan knee to chest, hamstring, LTR and hip abd   Consulted and Agree with Plan of Care Patient          G-Codes - March 12, 2015 1554    Functional Assessment Tool  Used FOTO   Functional Limitation Mobility: Walking and moving around   Mobility:  Walking and Moving Around Current Status [phone]) At least 40 percent but less than 60 percent impaired, limited or restricted   Mobility: Walking and Moving Around Goal Status [phone]) At least 20 percent but less than 40 percent impaired, limited or restricted      Problem List Patient Active Problem List   Diagnosis Date Noted  . Piriformis syndrome of right side 01/23/2015  . DDD (degenerative disc disease), lumbosacral 11/24/2014  . Constipation 11/24/2014  . Dark stools 11/24/2014  . Acute lower UTI 07/03/2014  . Sciatic pain 01/01/2014  . Cough 01/31/2013  . Tenesmus (rectal) 12/21/2012  . Recurrent UTI 10/29/2012  . Groin pain 08/06/2012  . Vertigo-Dizzy, vague feeling 11/16/2011  . Heart palpitations 11/16/2011  . ONYCHOMYCOSIS, TOENAILS 04/03/2009  . Diabetic neuropathy associated with type 2 diabetes mellitus 04/03/2009  . ALOPECIA 11/02/2007  . COLONIC POLYPS, HYPERPLASTIC 02/16/2007  . Type 2 diabetes mellitus with diabetic neuropathy 10/19/2006  . Pure hypercholesterolemia 10/19/2006  . OBESITY, NOS 10/19/2006  . GLAUCOMA 10/19/2006  . HYPERTENSION, BENIGN SYSTEMIC 10/19/2006  . OSTEOARTHRITIS, LOWER LEG 10/19/2006  . OSTEOPENIA 10/19/2006    Aime Meloche 03/10/2015, 4:37 PM  [place], [place], [address] Phone: [phone]   Fax:  [phone]   Raeford Razor, PT 03/10/2015 4:38 PM Phone: [phone] Fax: [phone]

## 2015-03-17 ENCOUNTER — Ambulatory Visit: Payer: Medicare Other | Admitting: Rehabilitative and Restorative Service Providers"

## 2015-03-17 DIAGNOSIS — M5441 Lumbago with sciatica, right side: Secondary | ICD-10-CM

## 2015-03-17 DIAGNOSIS — R208 Other disturbances of skin sensation: Secondary | ICD-10-CM | POA: Diagnosis not present

## 2015-03-17 DIAGNOSIS — M6289 Other specified disorders of muscle: Secondary | ICD-10-CM | POA: Diagnosis not present

## 2015-03-17 DIAGNOSIS — M5442 Lumbago with sciatica, left side: Secondary | ICD-10-CM | POA: Diagnosis not present

## 2015-03-17 DIAGNOSIS — R293 Abnormal posture: Secondary | ICD-10-CM

## 2015-03-17 NOTE — Patient Instructions (Signed)
HEP issued: lumbar stabilization 1 issued; reviewed previously issued HEP with pt I.

## 2015-03-17 NOTE — Therapy (Signed)
[place], [place], [address] Phone: [phone]   Fax:  [phone]  Physical Therapy Treatment  Patient Details  Name: Hayley Fisher MRN: [id number] Date of Birth: Jun 08, 1931 Referring Provider:  [name] *  Encounter Date: 03/17/2015      PT End of Session - 03/17/15 1327    Visit Number 3   Number of Visits 12   Date for PT Re-Evaluation 04/24/15   PT Start Time 9326   PT Stop Time 1325   PT Time Calculation (min) 40 min   Activity Tolerance Patient tolerated treatment well   Behavior During Therapy [facility] for tasks assessed/performed      Past Medical History  Diagnosis Date  . Hypertension   . Hyperlipidemia   . Diabetes mellitus   . Arthritis     Past Surgical History  Procedure Laterality Date  . Bladder tacking    . Bladder suspension      There were no vitals filed for this visit.  Visit Diagnosis:  Bilateral low back pain with sciatica, sciatica laterality unspecified  Abnormal posture      Subjective Assessment - 03/17/15 1300    Subjective 5-6/10 pain radiating to my butt   Limitations Sitting;Lifting;Standing;Walking;House hold activities   How long can you sit comfortably? gets stiff   How long can you stand comfortably? 30-35 min   How long can you walk comfortably? can walk comfortably    Patient Stated Goals to be able to walk and stand longer and work in my flowers longer, walk without the stick   Currently in Pain? Yes   Pain Score 5    Pain Location Back   Pain Orientation Right   Pain Descriptors / Indicators Burning;Aching   Pain Type Chronic pain   Pain Radiating Towards bil glutes R > L   Pain Onset More than a month ago   Aggravating Factors  lumbar flexion   Pain Relieving Factors neutral spine, meds   Effect of Pain on Daily Activities keeps her from doing ADLs   Multiple Pain Sites No                         [facility]  Adult PT Treatment/Exercise - 03/17/15 0001    Lumbar Exercises: Seated   Other Seated Lumbar Exercises Nustep bil UE/LEs level 5 x 5 min with PT verbal cues for tilt and posture   Lumbar Exercises: Supine   Other Supine Lumbar Exercises tilt x 10, tilt with march x 20, tilt with SLR x 20; tilt with bridge x 20; tilt with unilat and bil clam shell x 20; supine figure 4 2x30 sec; HEP issued   Ankle Exercises: Standing   Other Standing Ankle Exercises bil Gastroc stretch off of 6 inch step 2x30 sec                     PT Long Term Goals - 03/17/15 1323    PT LONG TERM GOAL #1   Title Pt will be I with HEP as of last visit for core, hip and low back   Time 6   Period Weeks   Status On-going   PT LONG TERM GOAL #2   Title Pt will score <40% on FOTO to demo functional improvement   Time 6   Period Weeks   Status On-going   PT LONG TERM GOAL #3   Title Pt will be able do  sit for 45 min for meals with min discomfort in low back   Time 6   Period Weeks   Status On-going   PT LONG TERM GOAL #4   Title Pt will be able to demo corrected standing posture without cueing   Time 6   Period Weeks   Status On-going               Plan - 03/17/15 1311    Clinical Impression Statement pt prefers flexion based exercises and tolerates pelvic tilt and R supine piriformis stretch with reduction of pain.   Pt will benefit from skilled therapeutic intervention in order to improve on the following deficits Decreased activity tolerance;Difficulty walking;Pain   Rehab Potential Good   PT Frequency 2x / week   PT Duration 6 weeks   PT Treatment/Interventions ADLs/Self Care Home Management;Electrical Stimulation;Cryotherapy;Gait training;Stair training;DME Instruction;Moist Heat;Traction;Ultrasound;Functional mobility training;Therapeutic activities;Therapeutic exercise;Balance training;Neuromuscular re-education;Manual techniques;Passive range of motion;Patient/family education   PT  Next Visit Plan progress as tolerated, assess standing posture and self-correction (?lateral shift)   PT Home Exercise Plan reviewed HEP; issued new HEP of lumbar stabilization 1   Consulted and Agree with Plan of Care Patient        Problem List Patient Active Problem List   Diagnosis Date Noted  . Piriformis syndrome of right side 01/23/2015  . DDD (degenerative disc disease), lumbosacral 11/24/2014  . Constipation 11/24/2014  . Dark stools 11/24/2014  . Acute lower UTI 07/03/2014  . Sciatic pain 01/01/2014  . Cough 01/31/2013  . Tenesmus (rectal) 12/21/2012  . Recurrent UTI 10/29/2012  . Groin pain 08/06/2012  . Vertigo-Dizzy, vague feeling 11/16/2011  . Heart palpitations 11/16/2011  . ONYCHOMYCOSIS, TOENAILS 04/03/2009  . Diabetic neuropathy associated with type 2 diabetes mellitus 04/03/2009  . ALOPECIA 11/02/2007  . COLONIC POLYPS, HYPERPLASTIC 02/16/2007  . Type 2 diabetes mellitus with diabetic neuropathy 10/19/2006  . Pure hypercholesterolemia 10/19/2006  . OBESITY, NOS 10/19/2006  . GLAUCOMA 10/19/2006  . HYPERTENSION, BENIGN SYSTEMIC 10/19/2006  . OSTEOARTHRITIS, LOWER LEG 10/19/2006  . OSTEOPENIA 10/19/2006    ARTIS,Jayli Fogleman 03/17/2015, 1:29 PM  [facility] [address] [place], [place], [address] Phone: [phone]   Fax:  [phone]

## 2015-03-27 ENCOUNTER — Ambulatory Visit: Payer: Medicare Other | Attending: Orthopaedic Surgery | Admitting: Rehabilitative and Restorative Service Providers"

## 2015-03-27 DIAGNOSIS — R293 Abnormal posture: Secondary | ICD-10-CM | POA: Insufficient documentation

## 2015-03-27 DIAGNOSIS — M5441 Lumbago with sciatica, right side: Secondary | ICD-10-CM

## 2015-03-27 DIAGNOSIS — M5442 Lumbago with sciatica, left side: Secondary | ICD-10-CM | POA: Diagnosis not present

## 2015-03-27 NOTE — Therapy (Addendum)
[place], [place], [address] Phone: [phone]   Fax:  [phone]  Physical Therapy Treatment  Patient Details  Name: Hayley Fisher MRN: [id number] Date of Birth: Oct 19, 1930 Referring Provider:  [name] *  Encounter Date: 03/27/2015      PT End of Session - 03/27/15 1051    Visit Number 4   Number of Visits 12   Date for PT Re-Evaluation 04/24/15   PT Start Time 0934   PT Stop Time 1029   PT Time Calculation (min) 55 min   Activity Tolerance Patient tolerated treatment well;Patient limited by pain   Behavior During Therapy [facility] for tasks assessed/performed      Past Medical History  Diagnosis Date  . Hypertension   . Hyperlipidemia   . Diabetes mellitus   . Arthritis     Past Surgical History  Procedure Laterality Date  . Bladder tacking    . Bladder suspension      There were no vitals filed for this visit.  Visit Diagnosis:  Bilateral low back pain with sciatica, sciatica laterality unspecified  Abnormal posture      Subjective Assessment - 03/27/15 0939    Subjective 5-6/10 pain radiating to my butt; not much change   Limitations Sitting;Lifting;Standing;Walking;House hold activities   How long can you sit comfortably? 20-30 min   How long can you stand comfortably? 30-35 min   How long can you walk comfortably? about an hour    Patient Stated Goals to be able to walk and stand longer and work in my flowers longer, walk without the stick   Currently in Pain? Yes   Pain Score 7    Pain Location Hip   Pain Orientation Right   Pain Descriptors / Indicators Aching   Pain Type Chronic pain   Pain Radiating Towards bil glutes R > L   Pain Onset More than a month ago   Pain Frequency Intermittent   Aggravating Factors  lumbar extension   Pain Relieving Factors neutral spine or lumbar flexion   Effect of Pain on Daily Activities keeps her from doing too many  standing activities   Multiple Pain Sites No                         [facility] Adult PT Treatment/Exercise - 03/27/15 0001    Lumbar Exercises: Seated   Other Seated Lumbar Exercises NuStep bil UE/LEs level 5 x 5 min with PT verbal cues for proper posture and maintaining pelvic tilt   Lumbar Exercises: Supine   Other Supine Lumbar Exercises Review HEP; R leg lengthener x 10 with 5 sec hold; R ITB/Piriformis knee crossover 3x30 sec; butterfly stretch 3x30 sec  pt needed max verbal/tactile cues for correct tilt   Moist Heat Therapy   Number Minutes Moist Heat 15 Minutes   Moist Heat Location --  R glute   Manual Therapy   Manual Therapy --  STW/trigger point R glute med/Piriformis                     PT Long Term Goals - 03/27/15 0955    PT LONG TERM GOAL #1   Title Pt will be I with HEP as of last visit for core, hip and low back   Time 6   Period Weeks   Status On-going   PT LONG TERM GOAL #2   Title Pt will score <40% on FOTO  to demo functional improvement   Period Weeks   Status On-going   PT LONG TERM GOAL #3   Title Pt will be able do sit for 45 min for meals with min discomfort in low back   Time 6   Period Weeks   Status On-going   PT LONG TERM GOAL #4   Title Pt will be able to demo corrected standing posture without cueing   Time 6   Period Weeks   Status On-going               Plan - 03/27/15 0950    Clinical Impression Statement pt continues to prefer flexion based exercises; states she has good days and bad days; core/lumbar strength continues to be poor which is increasing LBP. R glute pain still present with Piriformis stretching needed   Pt will benefit from skilled therapeutic intervention in order to improve on the following deficits Decreased activity tolerance;Pain   Rehab Potential Good   Clinical Impairments Affecting Rehab Potential Pain   PT Frequency 2x / week   PT Duration 6 weeks   PT Treatment/Interventions  ADLs/Self Care Home Management;Electrical Stimulation;Cryotherapy;Gait training;Stair training;DME Instruction;Moist Heat;Traction;Ultrasound;Functional mobility training;Therapeutic activities;Therapeutic exercise;Balance training;Neuromuscular re-education;Manual techniques;Passive range of motion;Patient/family education   PT Next Visit Plan progress as tolerated, assess standing posture and self-correction (?lateral shift); continue to progress lumbar flexion exercises   PT Home Exercise Plan no new exercises performed due to pt intermittent compliance of HEP given at last tx   Consulted and Agree with Plan of Care Patient        Problem List Patient Active Problem List   Diagnosis Date Noted  . Piriformis syndrome of right side 01/23/2015  . DDD (degenerative disc disease), lumbosacral 11/24/2014  . Constipation 11/24/2014  . Dark stools 11/24/2014  . Acute lower UTI 07/03/2014  . Sciatic pain 01/01/2014  . Cough 01/31/2013  . Tenesmus (rectal) 12/21/2012  . Recurrent UTI 10/29/2012  . Groin pain 08/06/2012  . Vertigo-Dizzy, vague feeling 11/16/2011  . Heart palpitations 11/16/2011  . ONYCHOMYCOSIS, TOENAILS 04/03/2009  . Diabetic neuropathy associated with type 2 diabetes mellitus 04/03/2009  . ALOPECIA 11/02/2007  . COLONIC POLYPS, HYPERPLASTIC 02/16/2007  . Type 2 diabetes mellitus with diabetic neuropathy 10/19/2006  . Pure hypercholesterolemia 10/19/2006  . OBESITY, NOS 10/19/2006  . GLAUCOMA 10/19/2006  . HYPERTENSION, BENIGN SYSTEMIC 10/19/2006  . OSTEOARTHRITIS, LOWER LEG 10/19/2006  . OSTEOPENIA 10/19/2006    Myra Rude, PT 03/27/2015, 11:02 AM  [facility] [address] [place], [place], [address] Phone: [phone]   Fax:  [phone]    PHYSICAL THERAPY DISCHARGE SUMMARY  Visits from Start of Care: 4  Current functional level related to goals / functional outcomes: See above for goals met    Remaining deficits: See above, no DC assessment   Education / Equipment: HEP and posture, body mech Plan: Patient agrees to discharge.  Patient goals were not met. Patient is being discharged due to the physician's request.  ?????   Raeford Razor, PT 05/07/2015 3:56 PM Phone: [phone] Fax: [phone]

## 2015-03-30 DIAGNOSIS — M545 Low back pain: Secondary | ICD-10-CM | POA: Diagnosis not present

## 2015-03-31 ENCOUNTER — Ambulatory Visit: Payer: Medicare Other | Admitting: Physical Therapy

## 2015-04-03 ENCOUNTER — Encounter: Payer: Medicare Other | Admitting: Rehabilitative and Restorative Service Providers"

## 2015-04-03 DIAGNOSIS — M545 Low back pain: Secondary | ICD-10-CM | POA: Diagnosis not present

## 2015-04-07 ENCOUNTER — Ambulatory Visit: Payer: Medicare Other | Admitting: Family Medicine

## 2015-04-07 ENCOUNTER — Encounter: Payer: Medicare Other | Admitting: Physical Therapy

## 2015-04-08 ENCOUNTER — Ambulatory Visit (INDEPENDENT_AMBULATORY_CARE_PROVIDER_SITE_OTHER): Payer: Medicare Other | Admitting: Family Medicine

## 2015-04-08 ENCOUNTER — Encounter: Payer: Self-pay | Admitting: Family Medicine

## 2015-04-08 VITALS — BP 146/70 | HR 90 | Temp 98.2°F | Ht 63.0 in | Wt 157.0 lb

## 2015-04-08 DIAGNOSIS — E1149 Type 2 diabetes mellitus with other diabetic neurological complication: Secondary | ICD-10-CM

## 2015-04-08 DIAGNOSIS — E1140 Type 2 diabetes mellitus with diabetic neuropathy, unspecified: Secondary | ICD-10-CM | POA: Diagnosis not present

## 2015-04-08 LAB — GLUCOSE, CAPILLARY: Glucose-Capillary: 317 mg/dL — ABNORMAL HIGH (ref 65–99)

## 2015-04-08 LAB — POCT GLYCOSYLATED HEMOGLOBIN (HGB A1C): HEMOGLOBIN A1C: 10.6

## 2015-04-08 MED ORDER — INSULIN GLARGINE 100 UNIT/ML ~~LOC~~ SOLN: 12.0000 [IU] | Freq: Every day | SUBCUTANEOUS | Status: DC

## 2015-04-08 NOTE — Assessment & Plan Note (Addendum)
Control worsening since April 2015, continues to worsen chronically likely due to decreased physical activity (limited by back pain) and dietary compliance. There are no signs of DKA, infection (including recurrent UTI), etc. so no need for urgent tx. Will increase lantus modestly at first to 12u qAM and start a daily record of fasting AM CBGs. She will follow up in 2 - 3 weeks with PCP for further titration. She doesn't need perfect control but definitely needs improvement.

## 2015-04-08 NOTE — Patient Instructions (Signed)
Thank you for coming in today!  - Increase lantus insulin to 12 units every day.  - Bring a log of your daily morning blood sugars. - Follow up with Dr. Parks Ranger, your primary doctor, to discuss further changes to your insulin.  - Stay away from bread, rice, tortillas and potatoes and try to get more exercise even if this is just walking daily.   Our clinic's number is [phone]. Feel free to call any time with questions or concerns. We will answer any questions after hours with our 24-hour emergency line at that number as well.   - Dr. Bonner Puna

## 2015-04-08 NOTE — Progress Notes (Signed)
Subjective: Hayley Fisher is a 81 y.o. female here for diabetes follow up.   She was diagnosed with T2DM over 15 years ago. Began insulin "years ago." Takes 10 units of lantus in the mornings, reporting 100% compliance without recent fever, chills, night sweats, cough, dysuria, urinary urgency or frequency, polyuria, polydipsia, abdominal pain, N/V/D, foot wounds/ulcers. Checks blood sugar at home in the morning and sometimes in the middle of the day. It is difficult for her to recall the specific average numbers but was alarmed that last night it was > 200mg /dl and this morning it was 137. She does not record values or have her glucometer with her. Denies low blood sugars (had CBG in 60-70s > 6 months ago but none since). She is able to explain common hypoglycemic symptoms and appropriate action plan in the event of these symptoms/low CBG. No recent hospitalizations. She has recurrent UTIs with urinary retention for which she takes trimethoprim suppression for the past several months. Again, she denies UTI symptoms and also no vaginal discharge, bleeding, itching, irritation. She has not been exercising like she used to due to back pain and endorses eating bacon and fruit most days for breakfast. Doesn't eat a lot of rice, tortillas, or bread but eats a fair amount of potatoes.   - PMFSH: Non-smoker, no EtOH, no illicit drugs. - Medications: reviewed and updated  Objective: BP 146/70 mmHg  Pulse 90  Temp(Src) 98.2 F (36.8 C) (Oral)  Ht 5\' 3"  (1.6 m)  Wt 157 lb (71.215 kg)  BMI 27.82 kg/m2 Gen: Well-appearing 81 y.o.female in no distress HEENT: Normocephalic, sclerae/conjunctivae clear, PERRL, MMM, posterior oropharynx clear, fair dentition Neck: Neck supple, no masses or lymphadenopathy; thyroid not enlarged  Pulm: Non-labored; CTAB, no wheezes  CV: Regular rate, no murmur appreciated; no LE edema, no JVD GI: Normoactive BS; soft, non-tender, non-distended, no HSM Skin: No wounds or rashes,  no acanthosis nigricans Neuro: CN II-XII without deficits, sensation intact to light touch, steady gait.  Wt Readings from Last 3 Encounters:  04/08/15 157 lb (71.215 kg)  02/04/15 163 lb 3.2 oz (74.027 kg)  01/23/15 163 lb 1.6 oz (73.982 kg)   Lab Results  Component Value Date   HGBA1C 9.1 12/12/2014   Assessment & Plan: Hayley Fisher is a 81 y.o. female here for hyperglycemia.    See problem list for problem-specific plans.

## 2015-04-09 ENCOUNTER — Encounter: Payer: Medicare Other | Admitting: Physical Therapy

## 2015-04-14 ENCOUNTER — Encounter: Payer: Medicare Other | Admitting: Physical Therapy

## 2015-04-15 DIAGNOSIS — M545 Low back pain: Secondary | ICD-10-CM | POA: Diagnosis not present

## 2015-04-16 ENCOUNTER — Encounter: Payer: Medicare Other | Admitting: Physical Therapy

## 2015-04-24 DIAGNOSIS — M4806 Spinal stenosis, lumbar region: Secondary | ICD-10-CM | POA: Diagnosis not present

## 2015-05-01 ENCOUNTER — Telehealth: Payer: Self-pay | Admitting: Family Medicine

## 2015-05-01 NOTE — Telephone Encounter (Signed)
Patient also asking if pcp received the note she left here last month re: her medical supplies she needs.

## 2015-05-01 NOTE — Telephone Encounter (Signed)
Reviewed chart.  1. Colonoscopy - Last done by Dr. Erskine Emery ([facility]) 2008, I do not see full details of this report explaining when to do repeat, some hyperplastic colon polyps were found, likely anywhere from 5 to 10 years, so she is either due now or would be 2018 at latest. Patient has Medicare A & B, and Medicaid listed, as far as I know patient should simply call Dr. Kelby Fam office to discuss scheduling with them. Normally we do not do referral for colonoscopy. I have not received anything from their office regarding setting up this colonoscopy.  2. DM supplies - Yes. I did receive the form that she is referring to for DM testing supplies etc, and it was completed as usual and submitted back via fax. Because it is just a supply form, I do not believe it was scanned. If she has not received anything, then I would recommend that she contact her supplier / pharmacy and check on the status, alternatively she can have it re-sent.  [name], [place], PGY-3

## 2015-05-01 NOTE — Telephone Encounter (Signed)
Patient checking to see if pcp has received anything from an outside office requesting to set her up for a colonoscopy? If not, patient wants to know if this can be set up?

## 2015-05-04 NOTE — Telephone Encounter (Signed)
Spoke with patient and informed her for message from MD.

## 2015-05-14 DIAGNOSIS — M4806 Spinal stenosis, lumbar region: Secondary | ICD-10-CM | POA: Diagnosis not present

## 2015-05-20 DIAGNOSIS — M4806 Spinal stenosis, lumbar region: Secondary | ICD-10-CM | POA: Diagnosis not present

## 2015-05-22 ENCOUNTER — Telehealth: Payer: Self-pay | Admitting: Family Medicine

## 2015-05-22 DIAGNOSIS — D126 Benign neoplasm of colon, unspecified: Secondary | ICD-10-CM

## 2015-05-22 NOTE — Telephone Encounter (Signed)
Need referral to get a colonoscopy to [place] office.  Please let patient know when scheduleld.

## 2015-05-22 NOTE — Telephone Encounter (Signed)
Patient states they told her she needed referral when called, will forward to MD.

## 2015-05-25 NOTE — Telephone Encounter (Signed)
Referral placed for GI (colonoscopy), last colonoscopy 2008 with hyperplastic polyps (done by Dr. Deatra Ina - [facility])  [name], [place], PGY-3

## 2015-05-26 ENCOUNTER — Encounter: Payer: Self-pay | Admitting: Gastroenterology

## 2015-06-18 DIAGNOSIS — M160 Bilateral primary osteoarthritis of hip: Secondary | ICD-10-CM | POA: Diagnosis not present

## 2015-07-18 DIAGNOSIS — R55 Syncope and collapse: Secondary | ICD-10-CM | POA: Diagnosis not present

## 2015-07-18 DIAGNOSIS — Z794 Long term (current) use of insulin: Secondary | ICD-10-CM | POA: Diagnosis not present

## 2015-07-18 DIAGNOSIS — Z87891 Personal history of nicotine dependence: Secondary | ICD-10-CM | POA: Diagnosis not present

## 2015-07-18 DIAGNOSIS — Z79899 Other long term (current) drug therapy: Secondary | ICD-10-CM | POA: Diagnosis not present

## 2015-07-18 DIAGNOSIS — M1990 Unspecified osteoarthritis, unspecified site: Secondary | ICD-10-CM | POA: Diagnosis not present

## 2015-07-18 DIAGNOSIS — E119 Type 2 diabetes mellitus without complications: Secondary | ICD-10-CM | POA: Diagnosis not present

## 2015-07-18 DIAGNOSIS — R9431 Abnormal electrocardiogram [ECG] [EKG]: Secondary | ICD-10-CM | POA: Diagnosis not present

## 2015-07-18 DIAGNOSIS — R61 Generalized hyperhidrosis: Secondary | ICD-10-CM | POA: Diagnosis not present

## 2015-07-18 DIAGNOSIS — R110 Nausea: Secondary | ICD-10-CM | POA: Diagnosis not present

## 2015-07-18 DIAGNOSIS — I10 Essential (primary) hypertension: Secondary | ICD-10-CM | POA: Diagnosis not present

## 2015-07-22 ENCOUNTER — Encounter: Payer: Self-pay | Admitting: Gastroenterology

## 2015-07-22 ENCOUNTER — Ambulatory Visit (INDEPENDENT_AMBULATORY_CARE_PROVIDER_SITE_OTHER): Payer: Medicare Other | Admitting: Gastroenterology

## 2015-07-22 VITALS — BP 110/60 | HR 80 | Ht 61.5 in | Wt 152.2 lb

## 2015-07-22 DIAGNOSIS — Z800 Family history of malignant neoplasm of digestive organs: Secondary | ICD-10-CM | POA: Diagnosis not present

## 2015-07-22 DIAGNOSIS — K5909 Other constipation: Secondary | ICD-10-CM

## 2015-07-22 DIAGNOSIS — E119 Type 2 diabetes mellitus without complications: Secondary | ICD-10-CM | POA: Diagnosis not present

## 2015-07-22 DIAGNOSIS — Z794 Long term (current) use of insulin: Secondary | ICD-10-CM | POA: Diagnosis not present

## 2015-07-22 DIAGNOSIS — IMO0001 Reserved for inherently not codable concepts without codable children: Secondary | ICD-10-CM

## 2015-07-22 MED ORDER — NA SULFATE-K SULFATE-MG SULF 17.5-3.13-1.6 GM/177ML PO SOLN: 1.0000 | Freq: Once | ORAL | Status: DC

## 2015-07-22 NOTE — Patient Instructions (Signed)

## 2015-07-23 ENCOUNTER — Encounter: Payer: Self-pay | Admitting: Gastroenterology

## 2015-07-23 DIAGNOSIS — E119 Type 2 diabetes mellitus without complications: Secondary | ICD-10-CM

## 2015-07-23 DIAGNOSIS — Z794 Long term (current) use of insulin: Secondary | ICD-10-CM

## 2015-07-23 DIAGNOSIS — IMO0001 Reserved for inherently not codable concepts without codable children: Secondary | ICD-10-CM | POA: Insufficient documentation

## 2015-07-23 DIAGNOSIS — Z800 Family history of malignant neoplasm of digestive organs: Secondary | ICD-10-CM | POA: Insufficient documentation

## 2015-07-23 NOTE — Progress Notes (Signed)
07/23/2015 Hayley Fisher [id number] [birth year]   HISTORY OF PRESENT ILLNESS:  This is a pleasant 81 year old female who is previously known to Dr. Deatra Ina.  Her care will be assumed by Dr. Silverio Decamp in his absence.  Her last colonoscopy was in 01/2007 at which time she was found to have some colon polyps that were removed and were hyperplastic on pathology.  She was entered for recall in 2013 but did not proceed with colonoscopy at that time.  Has family history of colon cancer in her mother who was not diagnosed until in her 41's, but this was the reason for colonoscopy recall.  Comes in today to discuss colonoscopy.  She is concerned because she has back pain and she had a friend who complained of back pain and then had colon cancer.  She also complains of constipation and describes her stools as hard clumps like marbles.  Has sensation that she needs to move her bowels but then nothing comes out.  Uses miralax sporadically.  Denies rectal bleeding.  Says that she has lost about 11 pounds in 5 months but appetite is good.    Had an episode of syncope last weekend while at her daughter's in [place] and was taken to the hospital.  Records in [facility] show that EKG was ok, troponins negative.  CBC with Hgb of 11 grams.  Blood sugar quite high at 407, but otherwise CMP ok.  No further issues with syncope since that time and will be following up with PCP.   Past Medical History  Diagnosis Date  . Hypertension   . Hyperlipidemia   . Diabetes mellitus   . Arthritis   . Arthritis   . Hyperplastic colon polyp   . Glaucoma    Past Surgical History  Procedure Laterality Date  . Bladder tacking    . Bladder suspension    . Abdominal hysterectomy    . Inguinal hernia repair Right     reports that she quit smoking about 34 years ago. Her smoking use included Cigarettes. She has never used smokeless tobacco. She reports that she does not drink alcohol or use illicit drugs. family history  includes Bone cancer in her daughter; Breast cancer in her daughter; Colon cancer in her mother; Diabetes in her brother and sister; Other in her father. Allergies  Allergen Reactions  . Levemir [Insulin Detemir] Itching      Outpatient Encounter Prescriptions as of 07/22/2015  Medication Sig  . acetaminophen (TYLENOL) 500 MG tablet Take 1 tablet (500 mg total) by mouth every 8 (eight) hours as needed.  Marland Kitchen aspirin 81 MG chewable tablet Chew 81 mg by mouth every morning.   . B-D INS SYR HALF-UNIT .3CC/31G 31G X 5/16" 0.3 ML MISC USE DAILY WITH EACH INSULIN INJECTION  . brimonidine (ALPHAGAN P) 0.1 % SOLN Place 1 drop into both eyes 2 (two) times daily.  . enalapril (VASOTEC) 20 MG tablet TAKE ONE TABLET BY MOUTH ONCE DAILY  . insulin glargine (LANTUS) 100 UNIT/ML injection Inject 0.12 mLs (12 Units total) into the skin daily. Dose varies, currently on 10 units daily  . latanoprost (XALATAN) 0.005 % ophthalmic solution Place 1 drop into the left eye at bedtime.   . Multiple Vitamins-Minerals (PRESERVISION AREDS PO) Take 1 tablet by mouth every morning.   . polyethylene glycol powder (GLYCOLAX/MIRALAX) powder Take 17 g by mouth 2 (two) times daily as needed.  . Na Sulfate-K Sulfate-Mg Sulf (SUPREP BOWEL PREP) SOLN Take 1 kit  by mouth once.  . [DISCONTINUED] hydrochlorothiazide (HYDRODIURIL) 12.5 MG tablet Take 1 tablet (12.5 mg total) by mouth daily.  . [DISCONTINUED] Naproxen Sodium (ALEVE) 220 MG CAPS Take 250-500 mg by mouth 2 (two) times daily as needed.  . [DISCONTINUED] traMADol (ULTRAM) 50 MG tablet Take 1 tablet (50 mg total) by mouth every 8 (eight) hours as needed.  . [DISCONTINUED] trimethoprim (TRIMPEX) 100 MG tablet   . [DISCONTINUED] zoster vaccine live, PF, (ZOSTAVAX) 49753 UNT/0.65ML injection Inject 19,400 Units into the skin once. (Patient not taking: Reported on 02/05/2015)   No facility-administered encounter medications on file as of 07/22/2015.     REVIEW OF SYSTEMS  :  All other systems reviewed and negative except where noted in the History of Present Illness.   PHYSICAL EXAM: BP 110/60 mmHg  Pulse 80  Ht 5' 1.5" (1.562 m)  Wt 152 lb 4 oz (69.06 kg)  BMI 28.31 kg/m2 General: Well developed black female in no acute distress Head: Normocephalic and atraumatic Eyes:  Sclerae anicteric, conjunctiva pink. Ears: Normal auditory acuity Lungs: Clear throughout to auscultation Heart: Regular rate and rhythm Abdomen: Soft, non-distended.  Normal bowel sounds.  Non-tender. Rectal:  Will be done at the time of colonoscopy. Musculoskeletal: Symmetrical with no gross deformities  Skin: No lesions on visible extremities Extremities: No edema  Neurological: Alert oriented x 4, grossly non-focal Psychological:  Alert and cooperative. Normal mood and affect  ASSESSMENT AND PLAN: -Family history of colon cancer in patient's mother:  Last colonoscopy was 8 years ago.  Will schedule for colonoscopy with Dr. Silverio Decamp.  The risks, benefits, and alternatives to colonoscopy were discussed with the patient and she consents to proceed.  -Constipation:  Will begin using Miralax daily instead of prn. -IDDM:  Insulin will be adjusted prior to endoscopic procedure per protocol. Will resume normal dosing after procedure.  CC:  Hayley Fisher *

## 2015-07-24 NOTE — Progress Notes (Signed)
Reviewed and agree with documentation and assessment and plan. K. Veena Craig Ionescu , MD   

## 2015-07-29 ENCOUNTER — Ambulatory Visit (AMBULATORY_SURGERY_CENTER): Payer: Medicare Other | Admitting: Gastroenterology

## 2015-07-29 ENCOUNTER — Encounter: Payer: Self-pay | Admitting: Gastroenterology

## 2015-07-29 VITALS — BP 144/81 | HR 81 | Temp 97.1°F | Resp 16 | Ht 61.5 in | Wt 152.0 lb

## 2015-07-29 DIAGNOSIS — Z538 Procedure and treatment not carried out for other reasons: Secondary | ICD-10-CM | POA: Diagnosis not present

## 2015-07-29 DIAGNOSIS — Z1211 Encounter for screening for malignant neoplasm of colon: Secondary | ICD-10-CM | POA: Diagnosis not present

## 2015-07-29 DIAGNOSIS — Z800 Family history of malignant neoplasm of digestive organs: Secondary | ICD-10-CM | POA: Diagnosis not present

## 2015-07-29 LAB — GLUCOSE, CAPILLARY
Glucose-Capillary: 180 mg/dL — ABNORMAL HIGH (ref 65–99)
Glucose-Capillary: 145 mg/dL — ABNORMAL HIGH (ref 65–99)

## 2015-07-29 MED ORDER — SODIUM CHLORIDE 0.9 % IV SOLN: 500.0000 mL | INTRAVENOUS | Status: DC

## 2015-07-29 NOTE — Patient Instructions (Signed)
YOU HAD AN ENDOSCOPIC PROCEDURE TODAY AT THE [facility]:   Refer to the procedure report that was given to you for any specific questions about what was found during the examination.  If the procedure report does not answer your questions, please call your gastroenterologist to clarify.  If you requested that your care partner not be given the details of your procedure findings, then the procedure report has been included in a sealed envelope for you to review at your convenience later.  YOU SHOULD EXPECT: Some feelings of bloating in the abdomen. Passage of more gas than usual.  Walking can help get rid of the air that was put into your GI tract during the procedure and reduce the bloating. If you had a lower endoscopy (such as a colonoscopy or flexible sigmoidoscopy) you may notice spotting of blood in your stool or on the toilet paper. If you underwent a bowel prep for your procedure, you may not have a normal bowel movement for a few days.  Please Note:  You might notice some irritation and congestion in your nose or some drainage.  This is from the oxygen used during your procedure.  There is no need for concern and it should clear up in a day or so.  SYMPTOMS TO REPORT IMMEDIATELY:   Following lower endoscopy (colonoscopy or flexible sigmoidoscopy):  Excessive amounts of blood in the stool  Significant tenderness or worsening of abdominal pains  Swelling of the abdomen that is new, acute  Fever of 100F or higher   For urgent or emergent issues, a gastroenterologist can be reached at any hour by calling [phone].   DIET: Your first meal following the procedure should be a small meal and then it is ok to progress to your normal diet. Heavy or fried foods are harder to digest and may make you feel nauseous or bloated.  Likewise, meals heavy in dairy and vegetables can increase bloating.  Drink plenty of fluids but you should avoid alcoholic beverages for 24  hours.  ACTIVITY:  You should plan to take it easy for the rest of today and you should NOT DRIVE or use heavy machinery until tomorrow (because of the sedation medicines used during the test).    FOLLOW UP: Our staff will call the number listed on your records the next business day following your procedure to check on you and address any questions or concerns that you may have regarding the information given to you following your procedure. If we do not reach you, we will leave a message.  However, if you are feeling well and you are not experiencing any problems, there is no need to return our call.  We will assume that you have returned to your regular daily activities without incident.  If any biopsies were taken you will be contacted by phone or by letter within the next 1-3 weeks.  Please call us at [phone] if you have not heard about the biopsies in 3 weeks.    SIGNATURES/CONFIDENTIALITY: You and/or your care partner have signed paperwork which will be entered into your electronic medical record.  These signatures attest to the fact that that the information above on your After Visit Summary has been reviewed and is understood.  Full responsibility of the confidentiality of this discharge information lies with you and/or your care-partner. 

## 2015-07-29 NOTE — Progress Notes (Signed)
A/ox3 pleased with MAC, report to Karen RN 

## 2015-07-29 NOTE — Progress Notes (Signed)
Per pt she was transported to the ED in [place], [place].  She said she was at her daughter's house, and she felt light headed and dizzy.  Per pt, "I saw white spots and my daughter said I went out".  Pt report her daughter called emergent response and was taken to the ED.  Pt said she "checked" out ok.  And toldshe had "syncope".. Pt was d/c. maw

## 2015-07-29 NOTE — Op Note (Signed)
[place]  [address] [place], [address]   COLONOSCOPY PROCEDURE REPORT  PATIENT: Hayley Fisher, Hayley Fisher  MR#: [id number] BIRTHDATE: [birth year] , 89  yrs. old GENDER: female ENDOSCOPIST: Harl Bowie, MD REFERRED OF:[id number] [place]:  07/29/2015 PROCEDURE:   Colonoscopy, screening First Screening Colonoscopy - Avg.  risk and is 50 yrs.  old or older - No.  Prior Negative Screening - Now for repeat screening. Less than 10 yrs Prior Negative Screening - Now for repeat screening.  Above average risk  History of Adenoma - Now for follow-up colonoscopy & has been > or = to 3 yrs.  N/A  Polyps removed today? No ASA CLASS:   Class II INDICATIONS:Screening for colonic neoplasia and Colorectal Neoplasm Risk Assessment for this procedure is average risk.   Mother had colon cancer at age 2 MEDICATIONS: Propofol 150 mg IV  DESCRIPTION OF PROCEDURE:   After the risks benefits and alternatives of the procedure were thoroughly explained, informed consent was obtained.  The digital rectal exam revealed no abnormalities of the rectum.   The LB [id number]  endoscope was introduced through the anus and advanced to the poor prep with solid stool in the rectum, procedure was aborted. No adverse events experienced.   The quality of the prep was poor.  The instrument was then slowly withdrawn as the colon was fully examined. Estimated blood loss is zero unless otherwise noted in this procedure report.      COLON FINDINGS: Poor prep with solid stool in the rectum and procedure was aborted.  Retroflexion was not performed. The time to cecum = 0.4 Withdrawal time = 0.4   The scope was withdrawn and the procedure completed. COMPLICATIONS: There were no immediate complications.  ENDOSCOPIC IMPRESSION: Poor prep with solid stool in the rectum and procedure was aborted  RECOMMENDATIONS: Given your age, you will not need another colonoscopy for  colon cancer screening or polyp surveillance.  These types of tests usually stop around the age 45.  eSigned:  Harl Bowie, MD 07/29/2015 4:08 PM

## 2015-07-30 ENCOUNTER — Telehealth: Payer: Self-pay

## 2015-07-30 NOTE — Telephone Encounter (Signed)
  Follow up Call-  Call back number 07/29/2015  Post procedure Call Back phone  # [phone] hm  Permission to leave phone message Yes    Patient was called for follow up from procedure on 07/29/15. There was no answer, but left a message on voice mail to call if she had any questions or concerns.

## 2015-08-14 ENCOUNTER — Telehealth: Payer: Self-pay | Admitting: Gastroenterology

## 2015-08-14 NOTE — Telephone Encounter (Signed)
Patient stopped her Miralax several days ago. Yesterday she had 3 stools. Today she has had one. She has taken Kaopectate. Denies any abdominal pain. No nausea, vomiting or blood with her bowel movements. She agrees not to take any more anti-diarrheal medications. She will eat her normal diet and fluids. Call back if she acutely worsens or she fails to continue to improve.

## 2015-09-08 DIAGNOSIS — H401123 Primary open-angle glaucoma, left eye, severe stage: Secondary | ICD-10-CM | POA: Diagnosis not present

## 2015-09-08 DIAGNOSIS — H401112 Primary open-angle glaucoma, right eye, moderate stage: Secondary | ICD-10-CM | POA: Diagnosis not present

## 2015-09-08 DIAGNOSIS — E119 Type 2 diabetes mellitus without complications: Secondary | ICD-10-CM | POA: Diagnosis not present

## 2015-09-09 ENCOUNTER — Telehealth: Payer: Self-pay | Admitting: Family Medicine

## 2015-09-09 ENCOUNTER — Ambulatory Visit (INDEPENDENT_AMBULATORY_CARE_PROVIDER_SITE_OTHER): Payer: Medicare Other | Admitting: Family Medicine

## 2015-09-09 ENCOUNTER — Encounter: Payer: Self-pay | Admitting: Family Medicine

## 2015-09-09 VITALS — BP 130/70 | HR 99 | Temp 98.6°F | Ht 62.0 in | Wt 147.0 lb

## 2015-09-09 DIAGNOSIS — M5137 Other intervertebral disc degeneration, lumbosacral region: Secondary | ICD-10-CM

## 2015-09-09 DIAGNOSIS — E1140 Type 2 diabetes mellitus with diabetic neuropathy, unspecified: Secondary | ICD-10-CM | POA: Diagnosis not present

## 2015-09-09 DIAGNOSIS — E119 Type 2 diabetes mellitus without complications: Secondary | ICD-10-CM

## 2015-09-09 DIAGNOSIS — I10 Essential (primary) hypertension: Secondary | ICD-10-CM | POA: Diagnosis not present

## 2015-09-09 DIAGNOSIS — Z794 Long term (current) use of insulin: Secondary | ICD-10-CM

## 2015-09-09 DIAGNOSIS — Z23 Encounter for immunization: Secondary | ICD-10-CM

## 2015-09-09 LAB — POCT GLYCOSYLATED HEMOGLOBIN (HGB A1C): HEMOGLOBIN A1C: 9.6

## 2015-09-09 MED ORDER — ENALAPRIL MALEATE 20 MG PO TABS: 20.0000 mg | ORAL_TABLET | Freq: Every day | ORAL | Status: DC

## 2015-09-09 MED ORDER — METFORMIN HCL 500 MG PO TABS: 500.0000 mg | ORAL_TABLET | Freq: Two times a day (BID) | ORAL | Status: DC

## 2015-09-09 MED ORDER — INSULIN GLARGINE 100 UNIT/ML ~~LOC~~ SOLN: 10.0000 [IU] | Freq: Every day | SUBCUTANEOUS | Status: DC

## 2015-09-09 NOTE — Telephone Encounter (Signed)
Pt unable to receive her diabetic medical supplies because she states that the PCP needs to "update his records with Medicare" and then he needs to contact [facility] about this mater @ [phone]. Please advise. Sadie Reynolds, ASA

## 2015-09-09 NOTE — Patient Instructions (Signed)
Thank you for coming in to clinic today.  1. A1c is improved from 10.6 to 9.6. - Keep taking Lantus, start 12 units daily, every 3 to 5 days, if fasting morning blood sugar is >150 persistently then increase by 2 units, until you reach 20 units, then stop - Start Metformin 500mg  at night with dinner for 1 to 2 weeks, then if tolerated (can cause upset stomach, cramping, diarrhea, then gradually increase to twice daily one tab in morning with breakfast and one in evening with dinner - please call us back and leave message with your medical supply company fax number, and we will send them the test strip and lancet rx  2. For Back, keep following with [facility], keep taking pain medicine as prescribed, use heating pad, stay active - we requested records today  3. For Blood pressure. This is controlled. No changes today, future may consider additional med if elevated.  Please schedule a follow-up appointment with Dr Parks Ranger in 3 months for Diabetes  If you have any other questions or concerns, please feel free to call the clinic to contact me. You may also schedule an earlier appointment if necessary.  However, if your symptoms get significantly worse, please go to the Emergency Department to seek immediate medical attention.  Hayley Fisher, [place]

## 2015-09-09 NOTE — Progress Notes (Signed)
Subjective:    Patient ID: Hayley Fisher, female    DOB: 1931-04-04, 82 y.o.   MRN: [id number]  HPI   CHRONIC DM, Type 2: Reports -  She is concerned blood sugars still elevated since last visit few months ago. Has glucometer, she is unsure how to reset the dates / times info on this. CBGs: Avg 200s, Low 110, High 308. Checks CBGs 1x daily fasting AM - needs test strips and lancets Meds: Lantus 10u daily, was advised to go up to 12 previously but then stopped this Reports  good compliance. Tolerating well w/o side-effects Currently on ACEi Lifestyle: Diet (not following regular DM diet) / Exercise (no regular exercise) - Followed by Ophthalmology, reports no DM retinopathy. Does have history of Glaucoma worse in Left eye Denies hypoglycemia, polyuria, visual changes, numbness or tingling.  BACK PAIN, acute on chronic osteoarthritis: - Last [facility] visit 02/04/15 for chronic LBP and R-hip pain, known history of severe degenerative OA lumbar spine. Established now with [facility] for chronic low back pain. Has had multiple imaging studies including MRI. Additional treatments include Lumbar spinal epidural injections with some mixed results. Last injection possibly 05/2015 by her memory. - Today pain is stable, intermittent flares, currently 5/10, improves with rest. Using occasional heating pad. Taking Tylenol 3 (codeine) 300-30mg  1-2x daily, not every day, prescribed by Ortho. Discontinued Tramadol and no longer taking - Ambulates with cane, improved since last visit - Denies any fevers/chills, n/v, falls / trauma / injury, leg weakness, numbness or tingling, saddle anesthesia, bladder / bowel incontinence or urinary retention  CHRONIC HTN: Reports - no new concerns. No recent home BP measurements. Current Meds - Enalapril 20mg  daily Reports good compliance, took meds today. Tolerating well, w/o complaints. Denies CP, dyspnea, HA, edema, dizziness / lightheadedness  I have  reviewed and updated the following as appropriate: allergies and current medications  Social Hx: - Former smoker  Review of Systems  See above HPI    Objective:   Physical Exam  Constitutional: She appears well-developed and well-nourished. No distress.  Well appearing, elderly AAF  HENT:  Head: Normocephalic and atraumatic.  Mouth/Throat: Oropharynx is clear and moist.  Cardiovascular: Regular rhythm, normal heart sounds and intact distal pulses.   No murmur heard. Mildly tachycardic  Pulmonary/Chest: Effort normal and breath sounds normal. No respiratory distress. She has no wheezes. She has no rales.  Musculoskeletal: She exhibits no edema.  Back: no deformity, mild tenderness to palpation lower lumbar paraspinal muscles with mild spasms L>R. Seated SLR without radiculopathy.  Neurological: She is alert.  Skin: Skin is warm and dry. No rash noted. She is not diaphoretic.  Nursing note and vitals reviewed.   BP 130/70 mmHg  Pulse 99  Temp(Src) 98.6 F (37 C) (Oral)  Ht 5\' 2"  (1.575 m)  Wt 147 lb (66.679 kg)  BMI 26.88 kg/m2     Assessment & Plan:   Problem List Items Addressed This Visit    DDD (degenerative disc disease), lumbosacral    Stable, chronic persistent lumbar OA. No acute flare today. No worsening radicular symptoms. Last imaging - MRI Ortho 2016 (awaiting records)  Plan: 1. Request records, release signed today, from [facility] 2. Discontinued Tramadol - now taking Tylenol 3 1-2x daily from Ortho 3. Discontinue NSAIDs, can use during flares. Consider topical voltaren gel 4. Follow-up Ortho, return PRN      HYPERTENSION, BENIGN SYSTEMIC    Initially elevated SBP 180, recheck normal 130/70 manual, suspect initial false  reading. Otherwise BP seems controlled. May be raised with chronic back pain. No complications  Plan: 1. Continue Enalapril 20mg  daily. Never started HCTZ by report, can hold this for now 2. Lifestyle modifications -  reduced sodium in diet, start mild gradual exercise 3. RTC 3 months - check BMET, re-check BP, consider resuming low dose HCTZ 12.5mg  daily if needed in future      Relevant Medications   enalapril (VASOTEC) 20 MG tablet   Type 2 diabetes mellitus with diabetic neuropathy ([place]) - Primary    Improved control but still overall poor control. Current HgbA1c 9.6 (09/09/15) improved from last 10.6 (03/2015), concern given previously A1c as low as 6.7 ([id number]) Complications - peripheral neuropathy HM: Eye Exam (performed 08/2015)  Plan:  1. Increase Lantus as previously instructed to 12u daily, titrate up by +2 units every 3 days if fasting CBG AM persistently >150, max titrate up to 20u 2. Start Metformin 500mg  BID, titrate up gradually, unclear why had stopped metformin years ago 3. Continue lifestyle modifications - improve diet and exercise (limited by back pain) 4. RTC 3 months      Relevant Medications   metFORMIN (GLUCOPHAGE) 500 MG tablet   insulin glargine (LANTUS) 100 UNIT/ML injection   enalapril (VASOTEC) 20 MG tablet   Other Relevant Orders   POCT HgB A1C (CPT 83036) (Completed)    Other Visit Diagnoses    Encounter for immunization          Meds ordered this encounter  Medications  . metFORMIN (GLUCOPHAGE) 500 MG tablet    Sig: Take 1 tablet (500 mg total) by mouth 2 (two) times daily with a meal.    Dispense:  60 tablet    Refill:  3  . insulin glargine (LANTUS) 100 UNIT/ML injection    Sig: Inject 0.1-0.2 mLs (10-20 Units total) into the skin daily. Dose varies, currently on 12 units daily    Dispense:  10 vial    Refill:  3  . enalapril (VASOTEC) 20 MG tablet    Sig: Take 1 tablet (20 mg total) by mouth daily.    Dispense:  90 tablet    Refill:  3     Follow-up Return in about 3 months (around 12/08/2015) for diabetes, blood pressure.  [name], [place], PGY-3

## 2015-09-10 MED ORDER — ONETOUCH ULTRASOFT LANCETS MISC: Status: DC

## 2015-09-10 MED ORDER — "INSULIN SYRINGE-NEEDLE U-100 31G X 5/16"" 0.3 ML MISC": Status: DC

## 2015-09-10 MED ORDER — GLUCOSE BLOOD VI STRP: ORAL_STRIP | Status: DC

## 2015-09-10 NOTE — Assessment & Plan Note (Signed)
Stable, chronic persistent lumbar OA. No acute flare today. No worsening radicular symptoms. Last imaging - MRI Ortho 2016 (awaiting records)  Plan: 1. Request records, release signed today, from [facility] 2. Discontinued Tramadol - now taking Tylenol 3 1-2x daily from Ortho 3. Discontinue NSAIDs, can use during flares. Consider topical voltaren gel 4. Follow-up Ortho, return PRN

## 2015-09-10 NOTE — Assessment & Plan Note (Signed)
Initially elevated SBP 180, recheck normal 130/70 manual, suspect initial false reading. Otherwise BP seems controlled. May be raised with chronic back pain. No complications  Plan: 1. Continue Enalapril 20mg  daily. Never started HCTZ by report, can hold this for now 2. Lifestyle modifications - reduced sodium in diet, start mild gradual exercise 3. RTC 3 months - check BMET, re-check BP, consider resuming low dose HCTZ 12.5mg  daily if needed in future

## 2015-09-10 NOTE — Assessment & Plan Note (Addendum)
Improved control but still overall poor control. Current HgbA1c 9.6 (09/09/15) improved from last 10.6 (03/2015), concern given previously A1c as low as 6.7 ([id number]) Complications - peripheral neuropathy HM: Eye Exam (performed 08/2015)  Plan:  1. Increase Lantus as previously instructed to 12u daily, titrate up by +2 units every 3 days if fasting CBG AM persistently >150, max titrate up to 20u 2. Start Metformin 500mg  BID, titrate up gradually, unclear why had stopped metformin years ago 3. Continue lifestyle modifications - improve diet and exercise (limited by back pain) 4. RTC 3 months

## 2015-09-10 NOTE — Telephone Encounter (Addendum)
Patient was unable to get diabetic testing supplies (test strips and lancets) last time through [employer] (contact phone [phone]), she needs an attending physician that is [employer] certified to sign and provide NPI on new prescription for supplies, new rx to be faxed back to [facility] at Fax# [phone]  Medicare / [facility] [place]) Onetouch Ultra(Blue) VI strip Onetouch Ultrasoft ([employer], medicare also) lancets Up to 100 strips / lancets per mo - quantity sufficient for 3 times daily testing  Prescriptions printed, signed, and also signed by preceptor Dr Andrena Mews ([place] certified) including NPI # on each rx. Faxed to above # today 09/10/15.  [name], [place], PGY-3

## 2015-09-28 DIAGNOSIS — E113311 Type 2 diabetes mellitus with moderate nonproliferative diabetic retinopathy with macular edema, right eye: Secondary | ICD-10-CM | POA: Diagnosis not present

## 2015-10-02 ENCOUNTER — Telehealth: Payer: Self-pay | Admitting: Family Medicine

## 2015-10-02 NOTE — Telephone Encounter (Signed)
Patient asks PCP to complete and fax form for her medication.

## 2015-10-05 NOTE — Telephone Encounter (Signed)
Placed in md box. Hayley Fisher, CMA

## 2015-10-07 NOTE — Telephone Encounter (Signed)
Received form from [facility] requesting DM supplies. See telephone note from 09/09/15, I had preceptor attending Dr Gwendlyn Deutscher complete rx pad written for all requested DM supplies, attending is [employer] certified, however it did not work apparently and new form was sent by [facility].  [employer] form for DM supplies was completed as before, for DM2, on insulin, check CBG x 3 daily. Signed and replaced NPI by attending Dr Andrena Mews. To be faxed 10/07/15.  Hayley Fisher, [place], PGY-3

## 2015-11-02 DIAGNOSIS — E113311 Type 2 diabetes mellitus with moderate nonproliferative diabetic retinopathy with macular edema, right eye: Secondary | ICD-10-CM | POA: Diagnosis not present

## 2015-11-02 DIAGNOSIS — H401123 Primary open-angle glaucoma, left eye, severe stage: Secondary | ICD-10-CM | POA: Diagnosis not present

## 2015-11-09 ENCOUNTER — Ambulatory Visit (INDEPENDENT_AMBULATORY_CARE_PROVIDER_SITE_OTHER): Payer: Medicare Other | Admitting: Family Medicine

## 2015-11-09 ENCOUNTER — Encounter: Payer: Self-pay | Admitting: Family Medicine

## 2015-11-09 VITALS — BP 143/67 | HR 77 | Temp 98.1°F | Wt 157.0 lb

## 2015-11-09 DIAGNOSIS — I872 Venous insufficiency (chronic) (peripheral): Secondary | ICD-10-CM | POA: Diagnosis not present

## 2015-11-09 DIAGNOSIS — I10 Essential (primary) hypertension: Secondary | ICD-10-CM

## 2015-11-09 DIAGNOSIS — E1140 Type 2 diabetes mellitus with diabetic neuropathy, unspecified: Secondary | ICD-10-CM | POA: Diagnosis not present

## 2015-11-09 LAB — POCT GLYCOSYLATED HEMOGLOBIN (HGB A1C): Hemoglobin A1C: 8.5

## 2015-11-09 NOTE — Assessment & Plan Note (Addendum)
Continued improved control. A1c 9.6 to 8.5 (down overall from 10.6 in 03/2015). Now adhering and titrating insulin, checking CBGs. Complications - peripheral neuropathy  Plan:  1. Continue Lantus 20u daily for now 2. Resume Metformin 500mg  BID (self discontinued 1-2 month ago due to urinary frequency, seems unrelated, no GI side-effects, repeat trial) 3. Continue DM diet 4. On ASA 5. RTC 2-3 month for annual physical, will check labs CMET, lipids - counsel on starting statin therapy

## 2015-11-09 NOTE — Progress Notes (Signed)
Subjective:    Patient ID: Hayley Fisher, female    DOB: 1931/04/01, 82 y.o.   MRN: [id number]  CC: Diabetes f/u, Leg Swelling  HPI  CHRONIC DM, Type 2: Reports - improved blood sugars overall since last visit, now checking sugars regularly. She has titrated up her insulin dose CBGs: Avg 120-140s, Low 80, High 200. Checks CBGs 1x daily fasting AM - dose not have log today Meds: Lantus 20u daily (from 10u, gradual titration), in morning Reports  good compliance. Tolerating well w/o side-effects Currently on ACEi Lifestyle: Diet (now eating less starchy foods, more vegetables, drinks more water, no more night eating) / Exercise (no regular exercise) Denies hypoglycemia, polyuria, visual changes, numbness or tingling.  LOWER LEG SWELLING: Reports history of bilateral Left > Right lower extremity swelling, some swelling present for months off and on, recently few weeks worsening Left. Improves with elevation and worse at end of day. Does elevate feet (but not above heart level), no strict low sodium diet, not wearing compression. No history of DVT. Denies recent immobilization or long travel. Denies leg/calf pain or redness.  CHRONIC HTN: Reports - no home BP measurements. Current Meds - Enalapril 20mg  daily Reports good compliance, took meds today. Tolerating well, w/o complaints. Denies CP, dyspnea, HA, edema, dizziness / lightheadedness  Social History  Substance Use Topics  . Smoking status: Former Smoker    Types: Cigarettes    Quit date: 11/29/1980  . Smokeless tobacco: Never Used  . Alcohol Use: No   Review of Systems  See above HPI    Objective:   Physical Exam  Constitutional: She appears well-developed and well-nourished. No distress.  Well appearing, elderly AAF, resting in wheelchair, comfortable  HENT:  Mouth/Throat: Oropharynx is clear and moist.  Cardiovascular: Regular rhythm, normal heart sounds and intact distal pulses.   No murmur  heard. Pulmonary/Chest: Effort normal and breath sounds normal. No respiratory distress. She has no wheezes. She has no rales.  Musculoskeletal: She exhibits edema (bilateral lower extremity ankle and lower leg +1 pitting edema Left > Right extending to anterior lower leg/shin. No significant calf asymmetry, non-tender, Homan's negative, no erythema.).  Neurological: She is alert.  Skin: Skin is warm and dry. No rash noted. She is not diaphoretic.  Nursing note and vitals reviewed.   BP 143/67 mmHg  Pulse 77  Temp(Src) 98.1 F (36.7 C) (Oral)  Wt 157 lb (71.215 kg)     Assessment & Plan:   Problem List Items Addressed This Visit    Chronic venous insufficiency    Most consistent with venous insufficiency. Not concerning for DVT. Diff dx: prior labs and work-up unremarkable for CKD, CHF, or liver injury as other medical cause of edema. - Inadequate conservative care (not elevating above heart)  Plan: 1. Advised on proper leg elevation, may use additional pillows, low sodium diet, compression 2. Follow-up      HYPERTENSION, BENIGN SYSTEMIC    Improved today. Mild initial elevated SBP 150s, re-check in 140s. Within range for age. No complications  Plan: 1. Continue Enalapril 20mg  daily. 2. Lifestyle modifications - reduced sodium in diet, again advised to start mild exercise 3. RTC 2-3 months for DM f/u, will check labs CMET at that visit, future can add low dose HCTZ 12.5mg  if persistently elevated BP      Type 2 diabetes mellitus with diabetic neuropathy ([place]) - Primary    Continued improved control. A1c 9.6 to 8.5 (down overall from 10.6 in 03/2015). Now adhering and  titrating insulin, checking CBGs. Complications - peripheral neuropathy  Plan:  1. Continue Lantus 20u daily for now 2. Resume Metformin 500mg  BID (self discontinued 1-2 month ago due to urinary frequency, seems unrelated, no GI side-effects, repeat trial) 3. Continue DM diet 4. On ASA 5. RTC 2-3 month for  annual physical, will check labs CMET, lipids - counsel on starting statin therapy      Relevant Orders   POCT A1C (Completed)     No orders of the defined types were placed in this encounter.     Follow-up Return in about 2 months (around 01/09/2016) for annual physical, labs.  [name], [place], PGY-3

## 2015-11-09 NOTE — Assessment & Plan Note (Addendum)
Most consistent with venous insufficiency. Not concerning for DVT. Diff dx: prior labs and work-up unremarkable for CKD, CHF, or liver injury as other medical cause of edema. - Inadequate conservative care (not elevating above heart)  Plan: 1. Advised on proper leg elevation, may use additional pillows, low sodium diet, compression 2. Follow-up

## 2015-11-09 NOTE — Patient Instructions (Signed)
Thank you for coming in to clinic today.  1. Your Diabetes is doing well, A1c down to 8.5 from 9.6 and then 10.6 prior to that. - Keep up good work with Lantus insulin 20 units every day - Restart the Metformin 500mg  twice a day one tablet, it may cause some upset stomach and diarrhea but frequently urinating is not a side effect, this is from high blood sugar - Keep up with your improved diet it is working well!  2. Leg swelling from Venous Insufficiency - Elevate toes above your nose or above heart level - Low sodium diet   Please schedule a follow-up appointment with Dr Parks Ranger in 1 to 2 months for Annual Physical, Labs  If you have any other questions or concerns, please feel free to call the clinic to contact me. You may also schedule an earlier appointment if necessary.  However, if your symptoms get significantly worse, please go to the Emergency Department to seek immediate medical attention.  [name], [name]

## 2015-11-09 NOTE — Assessment & Plan Note (Signed)
Improved today. Mild initial elevated SBP 150s, re-check in 140s. Within range for age. No complications  Plan: 1. Continue Enalapril 20mg  daily. 2. Lifestyle modifications - reduced sodium in diet, again advised to start mild exercise 3. RTC 2-3 months for DM f/u, will check labs CMET at that visit, future can add low dose HCTZ 12.5mg  if persistently elevated BP

## 2015-12-11 ENCOUNTER — Other Ambulatory Visit: Payer: Self-pay | Admitting: *Deleted

## 2015-12-11 DIAGNOSIS — E119 Type 2 diabetes mellitus without complications: Secondary | ICD-10-CM

## 2015-12-11 MED ORDER — "INSULIN SYRINGE-NEEDLE U-100 31G X 5/16"" 0.3 ML MISC": Status: DC

## 2015-12-14 ENCOUNTER — Other Ambulatory Visit: Payer: Self-pay | Admitting: Family Medicine

## 2015-12-14 DIAGNOSIS — Z794 Long term (current) use of insulin: Principal | ICD-10-CM

## 2015-12-14 DIAGNOSIS — M545 Low back pain: Secondary | ICD-10-CM | POA: Diagnosis not present

## 2015-12-14 DIAGNOSIS — IMO0001 Reserved for inherently not codable concepts without codable children: Secondary | ICD-10-CM

## 2015-12-14 DIAGNOSIS — M160 Bilateral primary osteoarthritis of hip: Secondary | ICD-10-CM | POA: Diagnosis not present

## 2015-12-14 DIAGNOSIS — E119 Type 2 diabetes mellitus without complications: Principal | ICD-10-CM

## 2015-12-25 DIAGNOSIS — M545 Low back pain: Secondary | ICD-10-CM | POA: Diagnosis not present

## 2015-12-28 DIAGNOSIS — M47816 Spondylosis without myelopathy or radiculopathy, lumbar region: Secondary | ICD-10-CM | POA: Diagnosis not present

## 2015-12-28 DIAGNOSIS — M4806 Spinal stenosis, lumbar region: Secondary | ICD-10-CM | POA: Diagnosis not present

## 2015-12-29 DIAGNOSIS — H401112 Primary open-angle glaucoma, right eye, moderate stage: Secondary | ICD-10-CM | POA: Diagnosis not present

## 2015-12-29 DIAGNOSIS — H401123 Primary open-angle glaucoma, left eye, severe stage: Secondary | ICD-10-CM | POA: Diagnosis not present

## 2016-01-13 ENCOUNTER — Encounter: Payer: Self-pay | Admitting: Family Medicine

## 2016-01-13 DIAGNOSIS — M48061 Spinal stenosis, lumbar region without neurogenic claudication: Secondary | ICD-10-CM | POA: Insufficient documentation

## 2016-01-19 DIAGNOSIS — M47816 Spondylosis without myelopathy or radiculopathy, lumbar region: Secondary | ICD-10-CM | POA: Diagnosis not present

## 2016-03-04 DIAGNOSIS — M47816 Spondylosis without myelopathy or radiculopathy, lumbar region: Secondary | ICD-10-CM | POA: Diagnosis not present

## 2016-03-07 ENCOUNTER — Other Ambulatory Visit: Payer: Self-pay | Admitting: Family Medicine

## 2016-03-25 ENCOUNTER — Ambulatory Visit (INDEPENDENT_AMBULATORY_CARE_PROVIDER_SITE_OTHER): Payer: Medicare Other | Admitting: Internal Medicine

## 2016-03-25 ENCOUNTER — Encounter: Payer: Self-pay | Admitting: Internal Medicine

## 2016-03-25 VITALS — BP 140/80 | HR 84 | Temp 98.8°F | Ht 62.0 in | Wt 157.0 lb

## 2016-03-25 DIAGNOSIS — I10 Essential (primary) hypertension: Secondary | ICD-10-CM

## 2016-03-25 DIAGNOSIS — E1149 Type 2 diabetes mellitus with other diabetic neurological complication: Secondary | ICD-10-CM | POA: Diagnosis not present

## 2016-03-25 DIAGNOSIS — E1140 Type 2 diabetes mellitus with diabetic neuropathy, unspecified: Secondary | ICD-10-CM | POA: Diagnosis not present

## 2016-03-25 DIAGNOSIS — R209 Unspecified disturbances of skin sensation: Secondary | ICD-10-CM | POA: Diagnosis not present

## 2016-03-25 LAB — LIPID PANEL
Cholesterol: 211 mg/dL — ABNORMAL HIGH (ref 125–200)
HDL: 100 mg/dL (ref >=46)
LDL CALC: 105 mg/dL (ref <130)
TRIGLYCERIDES: 28 mg/dL (ref <150)
Total CHOL/HDL Ratio: 2.1 Ratio (ref <=5.0)
VLDL: 6 mg/dL (ref <30)

## 2016-03-25 LAB — COMPLETE METABOLIC PANEL WITHOUT GFR
ALT: 16 U/L (ref 6–29)
AST: 22 U/L (ref 10–35)
Albumin: 3.9 g/dL (ref 3.6–5.1)
Alkaline Phosphatase: 81 U/L (ref 33–130)
BUN: 13 mg/dL (ref 7–25)
CO2: 31 mmol/L (ref 20–31)
Calcium: 8.9 mg/dL (ref 8.6–10.4)
Chloride: 101 mmol/L (ref 98–110)
Creat: 0.74 mg/dL (ref 0.60–0.88)
GFR, Est African American: 87 mL/min
GFR, Est Non African American: 76 mL/min
Glucose, Bld: 130 mg/dL — ABNORMAL HIGH (ref 65–99)
Potassium: 4.2 mmol/L (ref 3.5–5.3)
Sodium: 137 mmol/L (ref 135–146)
Total Bilirubin: 0.5 mg/dL (ref 0.2–1.2)
Total Protein: 6.1 g/dL (ref 6.1–8.1)

## 2016-03-25 LAB — POCT GLYCOSYLATED HEMOGLOBIN (HGB A1C): Hemoglobin A1C: 7.6

## 2016-03-25 MED ORDER — GABAPENTIN 100 MG PO CAPS: 100.0000 mg | ORAL_CAPSULE | Freq: Two times a day (BID) | ORAL | 1 refills | Status: DC

## 2016-03-25 MED ORDER — ATORVASTATIN CALCIUM 40 MG PO TABS: 40.0000 mg | ORAL_TABLET | Freq: Every day | ORAL | 2 refills | Status: DC

## 2016-03-25 NOTE — Assessment & Plan Note (Addendum)
Glycemic control improving. A1C today 7.6 (improved from 8.5 five months ago).  - Continue 15U with additional 5U for blood sugar greater than 150 - Continue metformin 500 mg BID - F/u in three months with repeat A1C at that time

## 2016-03-25 NOTE — Patient Instructions (Addendum)
It was nice meeting you today Ms. Hilligoss!  Please begin taking gabapentin (Neurontin) twice a day every day for the tingling in your feet.   Also, please begin taking atorvastatin 40 mg once a day for high cholesterol.   I will call you with the results of your lab tests if we need to make any medication changes. I will see you back in three months to follow-up on your diabetes and nerve pain.  If you have any questions or concerns, please feel free to call the clinic.   Be well,  Dr. Avon Gully

## 2016-03-25 NOTE — Progress Notes (Signed)
   Subjective:    Patient ID: Hayley Fisher, female    DOB: [birth year], 82 y.o.   MRN: [id number]  HPI  Patient presents for HTN and DM f/u, as well as concern for boil on buttocks.   Type II DM Thinks DM is fairly well-controlled recently. Has been taking Lantus 15U daily, with instructions to take 5 additional units if fasting blood sugar is >150. She reports requiring 20U typically 1-2 times per week. Only checks blood sugar in the AM, and usually has values in the low 100s. Lowest she can remember is 70 but that was a while ago. Does endorse stinging in her lower extremities. Denies numbness or tingling in upper extremities. Denies numbness or weakness. Thinks she was on gabapentin in the past but is unsure why she stopped it. Is interested in beginning gabapentin again. Patient sees her ophthalmologist every six months.   Concern for boil Patient thinks she may have a boil on her buttocks or perhaps in the gluteal fold. Reports first noticing a bump a few months ago that went away then returned. She has been putting Vaseline and Neosporin on the area. Endorses pain when sitting. She is unsure if there has been any drainage.   HTN Taking medications as prescribed. Does not measure BP at home. Denies vision changes or HA.   Review of Systems See HPI.     Objective:   Physical Exam  Constitutional: She is oriented to person, place, and time. She appears well-developed and well-nourished. No distress.  HENT:  Head: Normocephalic and atraumatic.  Cardiovascular: Normal rate, regular rhythm and normal heart sounds.   No murmur heard. Pulmonary/Chest: Effort normal and breath sounds normal. No respiratory distress. She has no wheezes.  Neurological: She is alert and oriented to person, place, and time.  Skin: Skin is warm and dry.  Exam performed with chaperone in room. No masses or lesions noted on buttocks or in gluteal fold in area where patient reports pain.   Psychiatric: She has a  normal mood and affect. Her behavior is normal.  Vitals reviewed.     Assessment & Plan:  HYPERTENSION, BENIGN SYSTEMIC Well-controlled today at 140/80. Per Dr. Parks Ranger' most recent note, consider beginning low dose HCTZ if BP elevated, but since it is within acceptable range at this visit, will not begin HCTZ at this time.  - Continue current med regimen - Refills not needed today, but will be happy to fill out necessary form for patient to receive refills when necessary  Type 2 diabetes mellitus with diabetic neuropathy Glycemic control improving. A1C today 7.6 (improved from 8.5 five months ago).  - Continue 15U with additional 5U for blood sugar greater than 150 - Continue metformin 500 mg BID - F/u in three months with repeat A1C at that time  Diabetic neuropathy associated with type 2 diabetes mellitus Worsening and not currently on gabapentin.  - Resume gabapentin beginning with 100mg  BID - Can increase dose accordingly at f/u appt  Abnormal sensation of buttocks Patient with concern for boil, however no abnormal findings on physical exam. Patient can continue to use Vaseline on the affected area if she feels this improves her symptoms.  - Continue to monitor  Adin Hector, MD, MPH PGY-2 [place] Pager [phone]

## 2016-03-25 NOTE — Assessment & Plan Note (Signed)
Worsening and not currently on gabapentin.  - Resume gabapentin beginning with 100mg  BID - Can increase dose accordingly at f/u appt

## 2016-03-25 NOTE — Assessment & Plan Note (Signed)
Well-controlled today at 140/80. Per Dr. Parks Ranger' most recent note, consider beginning low dose HCTZ if BP elevated, but since it is within acceptable range at this visit, will not begin HCTZ at this time.  - Continue current med regimen - Refills not needed today, but will be happy to fill out necessary form for patient to receive refills when necessary

## 2016-03-25 NOTE — Assessment & Plan Note (Signed)
Patient with concern for boil, however no abnormal findings on physical exam. Patient can continue to use Vaseline on the affected area if she feels this improves her symptoms.  - Continue to monitor

## 2016-04-12 DIAGNOSIS — E113311 Type 2 diabetes mellitus with moderate nonproliferative diabetic retinopathy with macular edema, right eye: Secondary | ICD-10-CM | POA: Diagnosis not present

## 2016-04-12 DIAGNOSIS — H401112 Primary open-angle glaucoma, right eye, moderate stage: Secondary | ICD-10-CM | POA: Diagnosis not present

## 2016-04-12 DIAGNOSIS — H401123 Primary open-angle glaucoma, left eye, severe stage: Secondary | ICD-10-CM | POA: Diagnosis not present

## 2016-04-21 ENCOUNTER — Other Ambulatory Visit: Payer: Self-pay | Admitting: Internal Medicine

## 2016-05-04 ENCOUNTER — Other Ambulatory Visit: Payer: Self-pay | Admitting: Family Medicine

## 2016-05-04 DIAGNOSIS — E1140 Type 2 diabetes mellitus with diabetic neuropathy, unspecified: Secondary | ICD-10-CM

## 2016-05-04 DIAGNOSIS — Z794 Long term (current) use of insulin: Principal | ICD-10-CM

## 2016-06-01 ENCOUNTER — Other Ambulatory Visit: Payer: Self-pay | Admitting: Internal Medicine

## 2016-06-01 ENCOUNTER — Other Ambulatory Visit: Payer: Self-pay | Admitting: *Deleted

## 2016-06-01 MED ORDER — ATORVASTATIN CALCIUM 40 MG PO TABS: 40.0000 mg | ORAL_TABLET | Freq: Every day | ORAL | 2 refills | Status: DC

## 2016-07-01 ENCOUNTER — Other Ambulatory Visit: Payer: Self-pay | Admitting: Internal Medicine

## 2016-07-11 ENCOUNTER — Other Ambulatory Visit: Payer: Self-pay | Admitting: Family Medicine

## 2016-07-11 DIAGNOSIS — Z794 Long term (current) use of insulin: Principal | ICD-10-CM

## 2016-07-11 DIAGNOSIS — E1140 Type 2 diabetes mellitus with diabetic neuropathy, unspecified: Secondary | ICD-10-CM

## 2016-07-12 ENCOUNTER — Other Ambulatory Visit: Payer: Self-pay | Admitting: Internal Medicine

## 2016-07-12 DIAGNOSIS — IMO0001 Reserved for inherently not codable concepts without codable children: Secondary | ICD-10-CM

## 2016-07-12 DIAGNOSIS — E119 Type 2 diabetes mellitus without complications: Secondary | ICD-10-CM

## 2016-07-12 DIAGNOSIS — Z794 Long term (current) use of insulin: Principal | ICD-10-CM

## 2016-07-12 DIAGNOSIS — E1140 Type 2 diabetes mellitus with diabetic neuropathy, unspecified: Secondary | ICD-10-CM

## 2016-07-12 NOTE — Telephone Encounter (Signed)
Pt needs refills on lantus and syringes. Pt uses [facility]. Pt only has 3 doses left, needs refill ASAP. Please advise. Thanks! ep

## 2016-07-13 ENCOUNTER — Other Ambulatory Visit: Payer: Self-pay | Admitting: Internal Medicine

## 2016-07-13 DIAGNOSIS — E119 Type 2 diabetes mellitus without complications: Secondary | ICD-10-CM

## 2016-07-13 MED ORDER — "INSULIN SYRINGE-NEEDLE U-100 31G X 5/16"" 0.3 ML MISC": 12 refills | Status: DC

## 2016-07-13 MED ORDER — INSULIN GLARGINE 100 UNIT/ML ~~LOC~~ SOLN: 10.0000 [IU] | Freq: Every day | SUBCUTANEOUS | 3 refills | Status: DC

## 2016-08-09 ENCOUNTER — Encounter: Payer: Self-pay | Admitting: Internal Medicine

## 2016-08-09 ENCOUNTER — Ambulatory Visit (INDEPENDENT_AMBULATORY_CARE_PROVIDER_SITE_OTHER): Payer: Medicare Other | Admitting: Internal Medicine

## 2016-08-09 VITALS — BP 150/90 | HR 85 | Temp 97.7°F | Ht 62.0 in | Wt 155.0 lb

## 2016-08-09 DIAGNOSIS — N390 Urinary tract infection, site not specified: Secondary | ICD-10-CM

## 2016-08-09 DIAGNOSIS — R350 Frequency of micturition: Secondary | ICD-10-CM

## 2016-08-09 DIAGNOSIS — E1140 Type 2 diabetes mellitus with diabetic neuropathy, unspecified: Secondary | ICD-10-CM

## 2016-08-09 DIAGNOSIS — Z23 Encounter for immunization: Secondary | ICD-10-CM

## 2016-08-09 DIAGNOSIS — Z794 Long term (current) use of insulin: Secondary | ICD-10-CM | POA: Diagnosis not present

## 2016-08-09 DIAGNOSIS — E7800 Pure hypercholesterolemia, unspecified: Secondary | ICD-10-CM

## 2016-08-09 DIAGNOSIS — E1142 Type 2 diabetes mellitus with diabetic polyneuropathy: Secondary | ICD-10-CM | POA: Diagnosis not present

## 2016-08-09 DIAGNOSIS — I10 Essential (primary) hypertension: Secondary | ICD-10-CM | POA: Diagnosis not present

## 2016-08-09 LAB — POCT URINALYSIS DIPSTICK
Bilirubin, UA: NEGATIVE
Glucose, UA: NEGATIVE
Ketones, UA: NEGATIVE
Nitrite, UA: NEGATIVE
PROTEIN UA: NEGATIVE
SPEC GRAV UA: 1.015
UROBILINOGEN UA: 0.2
pH, UA: 6

## 2016-08-09 LAB — POCT UA - MICROSCOPIC ONLY: WBC, Ur, HPF, POC: >20

## 2016-08-09 LAB — POCT GLYCOSYLATED HEMOGLOBIN (HGB A1C): Hemoglobin A1C: 7.1

## 2016-08-09 MED ORDER — CEPHALEXIN 500 MG PO CAPS: 500.0000 mg | ORAL_CAPSULE | Freq: Three times a day (TID) | ORAL | 0 refills | Status: DC

## 2016-08-09 MED ORDER — ENALAPRIL MALEATE 20 MG PO TABS: 20.0000 mg | ORAL_TABLET | Freq: Every day | ORAL | 3 refills | Status: DC

## 2016-08-09 MED ORDER — GABAPENTIN 100 MG PO CAPS
ORAL_CAPSULE | ORAL | 9 refills | Status: DC
Start: 2016-08-09 — End: 2017-03-16

## 2016-08-09 NOTE — Assessment & Plan Note (Signed)
Glycemic control continues to improve, with A1C further decreased to 7.1 today (from 7.6 three months ago).  - Continue 15U Lantus with additional 5U for blood sugar >150 - Continue metformin 500mg  BID - Continue gabapentin 100mg  BID for neuropathy - F/u with ophtho next week as planned - F/u in three months for repeat A1C

## 2016-08-09 NOTE — Progress Notes (Signed)
   Subjective:   Patient: Hayley Fisher       Birthdate: [birth year]       MRN: [id number]      HPI  Hayley Fisher is a 82 y.o. female presenting for DM, HTN, and HLD f/u. Also with concerns for UTI.    Type II DM Currently taking 15U Lantus with additional 5U if CBG >150. Reports having to take the additional five units about 1-2 times per month. Only checking blood sugar in the morning before breakfast. Usually readings between 100-150. Lowest number she can remember is 60. Highest number was this morning at 200, however she ate cherries last night and knew it would be elevated. She never misses doses of her insulin. Is also taking metformin 500mg  BID, which she does miss occasionally. Reports significant improvement in LE neuropathy since beginning gabapentin 100mg  BID at last visit. Would like to continue on this. Is seeing her ophthalmologist next Tuesday.   HTN Taking enalapril. Does not miss doses. Does not check her blood pressure at home or at the pharmacy. Denies HA or changes in vision.   HLD Taking Lipitor as prescribed.   Concern for UTI Increased urinary frequency for past two weeks, as well as malodorous urine. Some vaginal itching but denies dysuria. No hematuria. Denies fevers, chills, abdominal pain. Of note, patient has history of recurrent UTIs for which she was previously seen at [facility] by Dr. Matilde Sprang. She was taking trimethoprim daily for UTI prophylaxis, but self-discontinued this a while ago, as she felt it was not necessary. She also has not been seen by urology in almost a year in a half. Last recorded UTI was in 01/2015. She does not think she has had a UTI since then.   Smoking status reviewed.   Review of Systems See HPI.     Objective:  Physical Exam  Constitutional: She is oriented to person, place, and time and well-developed, well-nourished, and in no distress.  HENT:  Head: Normocephalic and atraumatic.  Eyes: Conjunctivae and EOM are  normal. Right eye exhibits no discharge. Left eye exhibits no discharge.  Pulmonary/Chest: Effort normal. No respiratory distress.  Neurological: She is alert and oriented to person, place, and time.  Skin: Skin is warm and dry.  Psychiatric: Affect and judgment normal.      Assessment & Plan:  Type 2 diabetes mellitus with diabetic neuropathy Glycemic control continues to improve, with A1C further decreased to 7.1 today (from 7.6 three months ago).  - Continue 15U Lantus with additional 5U for blood sugar >150 - Continue metformin 500mg  BID - Continue gabapentin 100mg  BID for neuropathy - F/u with ophtho next week as planned - F/u in three months for repeat A1C  Acute lower UTI Increased urinary frequency and malodorous urine, with cloudy urine and leukocytes noted on UA. As patient has not had a UTI in over a year and a half, do not think re-establishing care with urology or resuming UTI prophylaxis is necessary at this time, but will certainly consider doing so if patient has another UTI soon.  - Keflex 500mg  TID x7d - Return if no improvement after abx  Pure hypercholesterolemia Continue Lipitor  HYPERTENSION, BENIGN SYSTEMIC BP 150/90, then improved to 148/82.  - Continue enalapril   Adin Hector, MD, MPH PGY-2 [facility] Pager [phone]

## 2016-08-09 NOTE — Assessment & Plan Note (Signed)
-  Continue Lipitor °

## 2016-08-09 NOTE — Patient Instructions (Signed)
It was nice seeing you again today Ms. Masaki!  Congratulations on your improved A1C! Please keep taking your diabetes medications as you have been (15 units of Lantus daily with 5 additional units if blood sugar above 150, as well as metformin 500 mg twice a day). It is also important to keep your appointment with your eye doctor next week.   For your urinary tract infection, please begin taking Keflex (an antibiotic) three times a day for the next seven days. If you are still having symptoms after finishing the antibiotic, please call to schedule another appointment.   Please continue to take all your other medications as you have been.   We will see you back in three months for your next diabetes follow up.   If you have any questions or concerns, please feel free to call the clinic.   Be well,  Dr. Avon Gully

## 2016-08-09 NOTE — Assessment & Plan Note (Signed)
Increased urinary frequency and malodorous urine, with cloudy urine and leukocytes noted on UA. As patient has not had a UTI in over a year and a half, do not think re-establishing care with urology or resuming UTI prophylaxis is necessary at this time, but will certainly consider doing so if patient has another UTI soon.  - Keflex 500mg  TID x7d - Return if no improvement after abx

## 2016-08-09 NOTE — Assessment & Plan Note (Signed)
BP 150/90, then improved to 148/82.  - Continue enalapril

## 2016-08-17 DIAGNOSIS — H401112 Primary open-angle glaucoma, right eye, moderate stage: Secondary | ICD-10-CM | POA: Diagnosis not present

## 2016-08-17 DIAGNOSIS — H401123 Primary open-angle glaucoma, left eye, severe stage: Secondary | ICD-10-CM | POA: Diagnosis not present

## 2016-08-31 ENCOUNTER — Other Ambulatory Visit: Payer: Self-pay | Admitting: Internal Medicine

## 2016-08-31 DIAGNOSIS — E1140 Type 2 diabetes mellitus with diabetic neuropathy, unspecified: Secondary | ICD-10-CM

## 2016-08-31 NOTE — Telephone Encounter (Signed)
Pt has switched to [employer] for her diabetic supplies--strips and lancets.  The company says they sent a letter to dr Avon Gully but it hasnt been returned.  Please advise the pt when the letter is received and faxed back to [employer]

## 2016-09-02 MED ORDER — ACCU-CHEK AVIVA PLUS W/DEVICE KIT: PACK | 0 refills | Status: DC

## 2016-09-02 MED ORDER — GLUCOSE BLOOD VI STRP: ORAL_STRIP | 12 refills | Status: DC

## 2016-09-02 MED ORDER — ACCU-CHEK SOFTCLIX LANCET DEV KIT: PACK | 0 refills | Status: DC

## 2016-09-02 MED ORDER — ACCU-CHEK SOFTCLIX LANCETS MISC: 12 refills | Status: DC

## 2016-09-02 NOTE — Telephone Encounter (Signed)
Pt informed and pt stated said she would get the insurance company to resend letter. Godric Lavell Kennon Holter, CMA

## 2016-09-13 ENCOUNTER — Telehealth: Payer: Self-pay | Admitting: Internal Medicine

## 2016-09-13 ENCOUNTER — Ambulatory Visit (INDEPENDENT_AMBULATORY_CARE_PROVIDER_SITE_OTHER): Payer: Medicare Other | Admitting: Family Medicine

## 2016-09-13 ENCOUNTER — Encounter: Payer: Self-pay | Admitting: Family Medicine

## 2016-09-13 VITALS — BP 140/78 | HR 66 | Temp 98.9°F | Wt 156.0 lb

## 2016-09-13 DIAGNOSIS — L89312 Pressure ulcer of right buttock, stage 2: Secondary | ICD-10-CM

## 2016-09-13 NOTE — Patient Instructions (Signed)
Pressure Injury A pressure injury, sometimes called a bedsore, is an injury to the skin and underlying tissue caused by pressure. Pressure on blood vessels causes decreased blood flow to the skin, which can eventually cause the skin tissue to die and break down into a wound. Pressure injuries usually occur:  Over bony parts of the body such as the tailbone, shoulders, elbows, hips, and heels.  Under medical devices such as respiratory equipment, stockings, tubes, and splints. Pressure injuries start as reddened areas on the skin and can lead to pain, muscle damage, and infection. Pressure injuries can vary in severity. What are the causes? Pressure injuries are caused by a lack of blood supply to an area of skin. They can occur from intense pressure over a short period of time or from less intense pressure over a long period of time. What increases the risk? This condition is more likely to develop in people who:  Are in the hospital or an extended care facility.  Are bedridden or in a wheelchair.  Have an injury or disease that keeps them from:  Moving normally.  Feeling pain or pressure.  Have a condition that:  Makes them sleepy or less alert.  Causes poor blood flow.  Need to wear a medical device.  Have poor control of their bladder or bowel functions (incontinence).  Have poor nutrition (malnutrition).  Are of certain ethnicities. People of African American and Latino or Hispanic descent are at higher risk compared to other ethnic groups. If you are at risk for pressure ulcers, your health care provider may recommend certain types of bedding to help prevent them. These may include foam or gel mattresses covered with one of the following:  A sheepskin blanket.  A pad that is filled with gel, air, water, or foam. What are the signs or symptoms? The main symptom is a blister or change in skin color that opens into a wound. Other symptoms include:  Red or dark areas of  skin that do not turn white or pale when pressed with a finger.  Pain, warmth, or change of skin texture. How is this diagnosed? This condition is diagnosed with a medical history and physical exam. You may also have tests, including:  Blood tests to check for infection or signs of poor nutrition.  Imaging studies to check for damage to the deep tissues under your skin.  Blood flow studies. Your pressure injury will be staged to determine its severity. Staging is an assessment of:  The depth of the pressure injury.  Which tissues are exposed because of the pressure injury.  The causes of the pressure injury. How is this treated? The main focus of treatment is to help your injury heal. This may be done by:  Relieving or redistributing pressure on your skin. This includes:  Frequently changing your position.  Eliminating or minimizing positions that caused the wound or that can make the wound worse.  Using specific bed mattresses and chair cushions.  Refitting, resizing, or replacing any medical devices, or padding the skin under them.  Using creams or powders to prevent rubbing (friction) on the skin.  Keeping your skin clean and dry. This may include using a skin cleanser or skin protectant as told by your health care provider. This may be a lotion, ointment, or spray.  Cleaning your injury and removing any dead tissue from the wound (debridement).  Placing a bandage (dressing) over your injury.  Preventing or treating infection. This may include antibiotic, antimicrobial, or   antiseptic medicines. Treatment may also include medicine for pain. Sometimes surgery is needed to close the wound with a flap of healthy skin or a piece of skin from another area of your body (graft). You may need surgery if other treatments are not working or if your injury is very deep. Follow these instructions at home: Wound care   Follow instructions from your health care provider about:  How  to take care of your wound.  When and how you should change your dressing.  When you should remove your dressing. If your dressing is dry and stuck when you try to remove it, moisten or wet the dressing with saline or water so that it can be removed without harming your skin or wound tissue.  Check your wound every day for signs of infection. Have a caregiver do this for you if you are not able. Watch for:  More redness, swelling, or pain.  More fluid, blood, or pus.  A bad smell. Skin Care   Keep your skin clean and dry. Gently pat your skin dry.  Do not rub or massage your skin.  Use a skin protectant only as told by your health care provider.  Check your skin every day for any changes in color or any new blisters or sores (ulcers). Have a caregiver do this for you if you are not able. Medicines   Take over-the-counter and prescription medicines only as told by your health care provider.  If you were prescribed an antibiotic medicine, take it or apply it as told by your health care provider. Do not stop taking or using the antibiotic even if your condition improves. Reducing and Redistributing Pressure   Do not lie or sit in one position for a long time. Move or change position every two hours or as told by your health care provider.  Use pillows or cushions to reduce pressure. Ask your health care provider to recommend cushions or pads for you.  Use medical devices that do not rub your skin. Tell your health care provider if one of your medical devices is causing a pressure injury to develop. General instructions    Eat a healthy diet that includes lots of protein. Ask your health care provider for diet advice.  Drink enough fluid to keep your urine clear or pale yellow.  Be as active as you can every day. Ask your health care provider to suggest safe exercises or activities.  Do not abuse drugs or alcohol.  Keep all follow-up visits as told by your health care  provider. This is important.  Do not smoke. Contact a health care provider if:   You have chills or fever.  Your pain medicine is not helping.  You have any changes in skin color.  You have new blisters or sores.  You develop warmth, redness, or swelling near a pressure injury.  You have a bad odor or pus coming from your pressure injury.  You lose control of your bowels or bladder.  You develop new symptoms.  Your wound does not improve after 1-2 weeks of treatment.  You develop a new medical condition, such as diabetes, peripheral vascular disease, or conditions that affect your defense (immune) system. This information is not intended to replace advice given to you by your health care provider. Make sure you discuss any questions you have with your health care provider. Document Released: 08/08/2005 Document Revised: 01/11/2016 Document Reviewed: 12/17/2014 Elsevier Interactive Patient Education  2017 [employer]vier Inc[employer]  

## 2016-09-13 NOTE — Progress Notes (Signed)
   Subjective: CC: buttock concern [id number] Profit is a 83 y.o. female presenting to clinic today for same day appointment. PCP: Adin Hector, MD Concerns today include:  1. Buttock concern Patient reports that she has had several months history of buttock concern.  She reports that sometimes it is painful and she feels like a lump is present.  She reports that she does sit a lot.  Denies fevers, chills, pain, nausea, vomiting, pain with sitting, exudate/ drainage from site.  She has been applying rubbing alcohol, peroxide and neosporin to area.  Patient does have a PMH of controlled DM.  Allergies  Allergen Reactions  . Levemir [Insulin Detemir] Itching    Social Hx reviewed: non smoker. MedHx, current medications and allergies reviewed.  Please see EMR. ROS: Per HPI  Objective: Office vital signs reviewed. BP 140/78   Pulse 66   Temp 98.9 F (37.2 C) (Oral)   Wt 156 lb (70.8 kg)   BMI 28.53 kg/m   Physical Examination:  General: Awake, alert, elderly, well appearing female, No acute distress Cardio: regular rate Pulm: normal work of breathing on room air MSK: uses walker for ambulation Skin: dry; 1.5 x 1 cm area of hypopigmentation with very early stages of skin breakdown on the right intergluteal cleft. Nontender, nonerythematous, nonbleeding, nonexudative, nonindurated, no fluctuance.  Assessment/ Plan: 83 y.o. female   1. Decubitus ulcer of right buttock, stage 2.  Clinically appears to be an early pressure wound.  There is no evidence of infection on exam.  Reviewed home care with patient, See AVS - Patient to keep area clean and dry - Change positions frequently - Apply barrier topical like A&D ointment or Vaseline to area - Return precautions reviewed - Follow up with PCP prn.   Janora Norlander, DO PGY-3, [facility]

## 2016-09-21 DIAGNOSIS — H35372 Puckering of macula, left eye: Secondary | ICD-10-CM | POA: Diagnosis not present

## 2016-09-21 DIAGNOSIS — H26492 Other secondary cataract, left eye: Secondary | ICD-10-CM | POA: Diagnosis not present

## 2016-11-02 ENCOUNTER — Other Ambulatory Visit: Payer: Self-pay | Admitting: Internal Medicine

## 2016-11-26 ENCOUNTER — Encounter (HOSPITAL_COMMUNITY): Payer: Self-pay

## 2016-11-26 ENCOUNTER — Emergency Department (HOSPITAL_COMMUNITY)
Admission: EM | Admit: 2016-11-26 | Discharge: 2016-11-26 | Disposition: A | Discharge status: Home / Self Care | Payer: Medicare Other | Attending: Emergency Medicine | Admitting: Emergency Medicine

## 2016-11-26 DIAGNOSIS — I129 Hypertensive chronic kidney disease with stage 1 through stage 4 chronic kidney disease, or unspecified chronic kidney disease: Secondary | ICD-10-CM | POA: Insufficient documentation

## 2016-11-26 DIAGNOSIS — N189 Chronic kidney disease, unspecified: Secondary | ICD-10-CM | POA: Insufficient documentation

## 2016-11-26 DIAGNOSIS — Z7982 Long term (current) use of aspirin: Secondary | ICD-10-CM | POA: Diagnosis not present

## 2016-11-26 DIAGNOSIS — Z87891 Personal history of nicotine dependence: Secondary | ICD-10-CM | POA: Insufficient documentation

## 2016-11-26 DIAGNOSIS — R310 Gross hematuria: Secondary | ICD-10-CM | POA: Diagnosis not present

## 2016-11-26 DIAGNOSIS — E1140 Type 2 diabetes mellitus with diabetic neuropathy, unspecified: Secondary | ICD-10-CM | POA: Insufficient documentation

## 2016-11-26 DIAGNOSIS — Z794 Long term (current) use of insulin: Secondary | ICD-10-CM | POA: Insufficient documentation

## 2016-11-26 DIAGNOSIS — E1122 Type 2 diabetes mellitus with diabetic chronic kidney disease: Secondary | ICD-10-CM | POA: Diagnosis not present

## 2016-11-26 DIAGNOSIS — N3001 Acute cystitis with hematuria: Secondary | ICD-10-CM | POA: Insufficient documentation

## 2016-11-26 DIAGNOSIS — R319 Hematuria, unspecified: Secondary | ICD-10-CM | POA: Diagnosis present

## 2016-11-26 LAB — URINALYSIS, ROUTINE W REFLEX MICROSCOPIC
Bilirubin Urine: NEGATIVE
GLUCOSE, UA: NEGATIVE mg/dL
Ketones, ur: NEGATIVE mg/dL
NITRITE: POSITIVE — AB
Protein, ur: 100 mg/dL — AB
SPECIFIC GRAVITY, URINE: 1.003 — AB (ref 1.005–1.030)
pH: 7 (ref 5.0–8.0)

## 2016-11-26 MED ORDER — CEPHALEXIN 500 MG PO CAPS: 500.0000 mg | ORAL_CAPSULE | Freq: Four times a day (QID) | ORAL | 0 refills | Status: DC

## 2016-11-26 MED ORDER — PHENAZOPYRIDINE HCL 200 MG PO TABS
200.0000 mg | ORAL_TABLET | Freq: Three times a day (TID) | ORAL | 0 refills | Status: DC
Start: 2016-11-26 — End: 2017-11-16

## 2016-11-26 MED ORDER — CEPHALEXIN 500 MG PO CAPS
500.0000 mg | ORAL_CAPSULE | Freq: Once | ORAL | Status: AC
  Administered 2016-11-26: 500 mg via ORAL
  Filled 2016-11-26: qty 1

## 2016-11-26 NOTE — ED Provider Notes (Signed)
[place] DEPT Provider Note   CSN: [id number] Arrival date & time: 11/26/16  0151     History   Chief Complaint Chief Complaint  Patient presents with  . Hematuria  . Polyuria  . Dysuria    HPI Hayley Fisher is a 83 y.o. female.  Patient with history of HTN, HLD, DM, CKD presents with complaint of hematuria for the past 2-3 days. No fever, abdominal or flank pain, nausea or vomiting. She also reports frequency of urination and dysuria. She is here with daughter whom she lives with and who denies any recent periods of confusion, disorientation or altered mental status. She is eating and drinking per her usual. No diarrhea, constipation, melena or bloody stools.    The history is provided by the patient. No language interpreter was used.    Past Medical History:  Diagnosis Date  . Arthritis   . Arthritis   . Cataract    bil cateracts removed  . Chronic kidney disease    "spot on one of my kidneys" per pt  . Diabetes mellitus   . Glaucoma   . Hyperlipidemia   . Hyperplastic colon polyp   . Hypertension   . Syncope     Patient Active Problem List   Diagnosis Date Noted  . Abnormal sensation of buttocks 03/25/2016  . Spinal stenosis of lumbar region 01/13/2016  . Chronic venous insufficiency 11/09/2015  . Family hx of colon cancer 07/23/2015  . IDDM (insulin dependent diabetes mellitus) ([place]) 07/23/2015  . Piriformis syndrome of right side 01/23/2015  . DDD (degenerative disc disease), lumbosacral 11/24/2014  . Constipation 11/24/2014  . Dark stools 11/24/2014  . Acute lower UTI 07/03/2014  . Sciatic pain 01/01/2014  . Cough 01/31/2013  . Tenesmus (rectal) 12/21/2012  . Recurrent UTI 10/29/2012  . Groin pain 08/06/2012  . Vertigo-Dizzy, vague feeling 11/16/2011  . Heart palpitations 11/16/2011  . ONYCHOMYCOSIS, TOENAILS 04/03/2009  . Diabetic neuropathy associated with type 2 diabetes mellitus ([place]) 04/03/2009  . ALOPECIA 11/02/2007  . COLONIC POLYPS,  HYPERPLASTIC 02/16/2007  . Type 2 diabetes mellitus with diabetic neuropathy ([place]) 10/19/2006  . Pure hypercholesterolemia 10/19/2006  . OBESITY, NOS 10/19/2006  . GLAUCOMA 10/19/2006  . HYPERTENSION, BENIGN SYSTEMIC 10/19/2006  . OSTEOARTHRITIS, LOWER LEG 10/19/2006  . OSTEOPENIA 10/19/2006    Past Surgical History:  Procedure Laterality Date  . ABDOMINAL HYSTERECTOMY    . BLADDER SUSPENSION    . bladder tacking    . COLONOSCOPY    . INGUINAL HERNIA REPAIR Right     OB History    No data available       Home Medications    Prior to Admission medications   Medication Sig Start Date End Date Taking? Authorizing Provider  ACCU-CHEK SOFTCLIX LANCETS lancets Use to test blood sugar up to 3 times a day. ICD-10 code: E11.40. 09/02/16   Verner Mould, MD  acetaminophen (TYLENOL) 500 MG tablet Take 1 tablet (500 mg total) by mouth every 8 (eight) hours as needed. 01/01/14   Dayarmys Piloto de Gwendalyn Ege, MD  aspirin 81 MG chewable tablet Chew 81 mg by mouth every morning.     Historical Provider, MD  atorvastatin (LIPITOR) 40 MG tablet TAKE 1 TABLET BY MOUTH EVERY DAY 07/01/16   Verner Mould, MD  BD INSULIN SYRINGE ULTRAFINE 31G X 15/64" 0.3 ML MISC USE TO INJECT daily WITH EACH insulin injection. 12/14/15   Olin Hauser, DO  Blood Glucose Monitoring Suppl (ACCU-CHEK AVIVA PLUS) w/Device KIT  Use to test blood sugar up to 3 times a day. ICD-10 code: E11.40. 09/02/16   Verner Mould, MD  brimonidine (ALPHAGAN P) 0.1 % SOLN Place 1 drop into both eyes 2 (two) times daily. 09/20/11     cephALEXin (KEFLEX) 500 MG capsule Take 1 capsule (500 mg total) by mouth 3 (three) times daily. 08/09/16   Verner Mould, MD  enalapril (VASOTEC) 20 MG tablet Take 1 tablet (20 mg total) by mouth daily. 08/09/16   Verner Mould, MD  gabapentin (NEURONTIN) 100 MG capsule TAKE 1 CAPSULE BY MOUTH 2 TIMES DAILY 08/09/16   Verner Mould, MD    gabapentin (NEURONTIN) 100 MG capsule TAKE 1 CAPSULE BY MOUTH 2 TIMES DAILY 11/02/16   Verner Mould, MD  glucose blood (ACCU-CHEK AVIVA PLUS) test strip Use to test blood sugar up to 3 times a day. ICD-10 code: E11.40. 09/02/16   Verner Mould, MD  insulin glargine (LANTUS) 100 UNIT/ML injection Inject 0.1-0.2 mLs (10-20 Units total) into the skin daily. Dose varies, currently on 12 units daily 07/13/16   Verner Mould, MD  Insulin Syringe-Needle U-100 (B-D INS SYR HALF-UNIT .3CC/31G) 31G X 5/16" 0.3 ML MISC USE DAILY WITH EACH INSULIN INJECTION 07/13/16   Verner Mould, MD  Lancets [facility] ULTRASOFT) lancets Check blood sugar up to 3 times daily as instructed. 09/10/15   Olin Hauser, DO  Lancets Misc. (ACCU-CHEK SOFTCLIX LANCET DEV) KIT Use as directed. ICD-10 code: E11.40. 09/02/16   Verner Mould, MD  latanoprost (XALATAN) 0.005 % ophthalmic solution Place 1 drop into the left eye at bedtime.  09/14/12   Historical Provider, MD  metFORMIN (GLUCOPHAGE) 500 MG tablet TAKE 1 TABLET BY MOUTH 2 TIMES DAILY WITH FOOD 04/26/16   Verner Mould, MD  Multiple Vitamins-Minerals (PRESERVISION AREDS PO) Take 1 tablet by mouth every morning.     Historical Provider, MD  polyethylene glycol powder (GLYCOLAX/MIRALAX) powder Take 17 g by mouth 2 (two) times daily as needed. 11/24/14   Sharon [address], MD    Family History Family History  Problem Relation Age of Onset  . Colon cancer Mother   . Diabetes Brother   . Diabetes Sister     x 2  . Other Father     cerebral hemorrhage  . Bone cancer Daughter   . Breast cancer Daughter   . Esophageal cancer Neg Hx   . Rectal cancer Neg Hx   . Stomach cancer Neg Hx     Social History Social History  Substance Use Topics  . Smoking status: Former Smoker    Types: Cigarettes    Quit date: 11/29/1980  . Smokeless tobacco: Never Used  . Alcohol use No     Allergies   Levemir  [insulin detemir]   Review of Systems Review of Systems  Constitutional: Negative for activity change, appetite change, chills and fever.  Respiratory: Negative.  Negative for cough.   Cardiovascular: Negative.  Negative for chest pain.  Gastrointestinal: Negative.  Negative for abdominal pain, nausea and vomiting.  Genitourinary: Positive for dysuria, frequency and hematuria. Negative for flank pain.  Musculoskeletal: Negative.  Negative for back pain.  Skin: Negative.   Neurological: Negative.  Negative for weakness.     Physical Exam Updated Vital Signs BP (!) 153/82 (BP Location: Left Arm)   Pulse 97   Temp 98.2 F (36.8 C) (Oral)   Resp 16   SpO2 98%   Physical Exam  Constitutional:  She is oriented to person, place, and time. She appears well-developed and well-nourished.  Very well appearing patient in NAD.  Neck: Normal range of motion.  Cardiovascular: Normal rate.   Pulmonary/Chest: Effort normal.  Abdominal: Soft. She exhibits no distension. There is no tenderness. There is no rebound and no guarding.  Musculoskeletal: Normal range of motion.  Neurological: She is alert and oriented to person, place, and time.  Skin: Skin is warm and dry.     ED Treatments / Results  Labs (all labs ordered are listed, but only abnormal results are displayed) Labs Reviewed  URINALYSIS, ROUTINE W REFLEX MICROSCOPIC - Abnormal; Notable for the following:       Result Value   APPearance HAZY (*)    Specific Gravity, Urine 1.003 (*)    Hgb urine dipstick LARGE (*)    Protein, ur 100 (*)    Nitrite POSITIVE (*)    Leukocytes, UA LARGE (*)    Bacteria, UA MANY (*)    Squamous Epithelial / LPF 0-5 (*)    All other components within normal limits    EKG  EKG Interpretation None       Radiology No results found.  Procedures Procedures (including critical care time)  Medications Ordered in ED Medications - No data to display   Initial Impression / Assessment and  Plan / ED Course  I have reviewed the triage vital signs and the nursing notes.  Pertinent labs & imaging results that were available during my care of the patient were reviewed by me and considered in my medical decision making (see chart for details).     Patient with symptoms of UTI and evidence infection on her lab results. She is very well appearing and without confusion, activity change, fever, vomiting or loss of appetite. VSS. Discussed with Dr. [name]. The patient can be discharged home with oral abx and PCP follow up. Return precautions discussed.   Final Clinical Impressions(s) / ED Diagnoses   Final diagnoses:  None   1. UTI 2. Hematuria   New Prescriptions New Prescriptions   No medications on file     Charlann Lange, Hershal Coria 11/26/16 [employer], MD 11/26/16 2330

## 2016-11-26 NOTE — ED Triage Notes (Signed)
Pt reports dysuria, polyuria, and hematuria since yesterday.

## 2016-11-28 LAB — URINE CULTURE

## 2016-11-29 ENCOUNTER — Telehealth: Payer: Self-pay | Admitting: Emergency Medicine

## 2016-11-29 NOTE — Telephone Encounter (Signed)
Post ED Visit - Positive Culture Follow-up  Culture report reviewed by antimicrobial stewardship pharmacist:  []  Elenor Quinones, Pharm.D. [x]  Heide Guile, Pharm.D., BCPS AQ-ID []  Parks Neptune, Pharm.D., BCPS []  Alycia Rossetti, Pharm.D., BCPS []  [place], [place].D., BCPS, AAHIVP []  Legrand Como, Pharm.D., BCPS, AAHIVP []  Salome Arnt, PharmD, BCPS []  Dimitri Ped, PharmD, BCPS []  Vincenza Hews, PharmD, BCPS  Positive urine culture Treated with cephalexin, organism sensitive to the same and no further patient follow-up is required at this time.  Hazle Nordmann 11/29/2016, 9:47 AM

## 2017-01-18 ENCOUNTER — Other Ambulatory Visit: Payer: Self-pay | Admitting: Internal Medicine

## 2017-01-18 DIAGNOSIS — Z794 Long term (current) use of insulin: Principal | ICD-10-CM

## 2017-01-18 DIAGNOSIS — E1140 Type 2 diabetes mellitus with diabetic neuropathy, unspecified: Secondary | ICD-10-CM

## 2017-02-14 ENCOUNTER — Ambulatory Visit: Payer: Medicare Other | Admitting: Family Medicine

## 2017-03-16 ENCOUNTER — Encounter: Payer: Self-pay | Admitting: Internal Medicine

## 2017-03-16 ENCOUNTER — Ambulatory Visit (INDEPENDENT_AMBULATORY_CARE_PROVIDER_SITE_OTHER): Payer: Medicare Other | Admitting: Internal Medicine

## 2017-03-16 VITALS — BP 142/90 | HR 111 | Temp 98.4°F | Ht 62.0 in | Wt 155.0 lb

## 2017-03-16 DIAGNOSIS — L98421 Non-pressure chronic ulcer of back limited to breakdown of skin: Secondary | ICD-10-CM | POA: Diagnosis not present

## 2017-03-16 DIAGNOSIS — K644 Residual hemorrhoidal skin tags: Secondary | ICD-10-CM | POA: Diagnosis not present

## 2017-03-16 DIAGNOSIS — E1149 Type 2 diabetes mellitus with other diabetic neurological complication: Secondary | ICD-10-CM

## 2017-03-16 DIAGNOSIS — L98429 Non-pressure chronic ulcer of back with unspecified severity: Secondary | ICD-10-CM

## 2017-03-16 DIAGNOSIS — R110 Nausea: Secondary | ICD-10-CM | POA: Diagnosis not present

## 2017-03-16 LAB — POCT GLYCOSYLATED HEMOGLOBIN (HGB A1C): HEMOGLOBIN A1C: 8.2

## 2017-03-16 MED ORDER — GABAPENTIN 300 MG PO CAPS: 300.0000 mg | ORAL_CAPSULE | Freq: Two times a day (BID) | ORAL | 0 refills | Status: DC

## 2017-03-16 MED ORDER — HYDROCORTISONE 2.5 % RE CREA: 1.0000 "application " | TOPICAL_CREAM | Freq: Two times a day (BID) | RECTAL | 0 refills | Status: DC

## 2017-03-16 NOTE — Assessment & Plan Note (Signed)
Infrequent and not particularly bothersome to patient. As such, will continue to monitor with no intervention currently indicated.

## 2017-03-16 NOTE — Patient Instructions (Addendum)
It was nice seeing you again today Ms. Ahlin!  We are increasing your gabapentin to 300 mg twice a day. You can continue taking three 100 mg tablets until you run out, then begin taking the 300 mg tablets that I prescribed today. Continue taking your insulin as you have been. Try to cut down on breads and fried foods like we discussed to try to bring down your A1C.   For your hemorrhoids, you can apply the Anusol rectal cream twice daily.   Please apply Vaseline to the ulcer on your bottom. If it becomes painful or seems to be getting worse, please schedule an appointment. Be sure to make sure you are not sitting or laying down all day, and that you are repositioning frequently when you are sitting or laying, so you will not make the ulcer worse.   I will see you back in three months to see how your blood sugars and A1C are doing.   If you have any questions or concerns, please feel free to call the clinic.   Be well,  Dr. Avon Gully

## 2017-03-16 NOTE — Assessment & Plan Note (Signed)
Stage 1. Currently not painful to patient. Discussed with Dr. Erin Hearing, who recommended reducing friction with use of Vaseline. Discussed this as well as importance of remaining mobile and frequent repositioning when sitting or laying down.  - Apply Vaseline BID - F/u if worsening or no improvement or becomes painful - Will be sure to reassess at next visit

## 2017-03-16 NOTE — Assessment & Plan Note (Signed)
A1C increased to 8.2 today, up from 7.1 at last visit 7 months ago. Likely due to patient's reported dietary changes, with increased carbohydrate intake. As patient with identifiable cause for elevated A1C, will not make insulin changes today, and will rather focus on resuming healthier diet that Hayley Fisher was following previously. No hypoglycemic episodes reported. Neuropathy not currently well-controlled with low dose of gabapentin 100mg  BID, so will increase.  - Continue Lantus 15U with additional 5U for CBG >500 - Increase gabapentin to 300mg  BID - F/u with ophtho next month as planned - F/u in three months for repeat A1C. Consider increasing insulin if A1C not improving or worsening.  - Continue metformin BID

## 2017-03-16 NOTE — Progress Notes (Signed)
Subjective:   Patient: Hayley Fisher       Birthdate: [birth year]       MRN: [id number]      HPI  Hayley Fisher is a 83 y.o. female presenting for DM f/u. Also reporting rectal itching and nausea.    Type II DM Taking 15U Lantus with additional 5U if CBG >500. Still takes additional 5U only about 1-2 times per month. Checks blood sugar first thing in AM. Normal readings between 100-150. Lowest 75. Says that her diet has changed some since her last appt in December, though she cannot say why. Has been eating more bread and fried foods. Also eating a lot of fruits, especially strawberries. Went to ophtho in December, and has another appt next month. Still endorses numbness and tingling of feet. Says that gabapentin does not seem to be helping anymore.   Rectal itching Patient reports rectal itching ongoing for the past week. Has also noticed a fishy odor when using the bathroom. Denies vaginal discharge or bleeding. Denies vaginal irritation or itching. Denies dysuria. Denies dyschezia, hematochezia, or melena. Has a history of hemorrhoids a long time ago and said they were pruritic. Has not tried anything to help with her symptoms.   Nausea Reports nausea occurring intermittently. Denies associated vomiting. Typically occurs after eating dinner. Goes away after drinking ginger ale. Cannot identify any specific foods which seem to cause nausea. Has had a couple episodes of nausea first thing in the morning. Is not particularly bothersome but she wanted to mention it.    Smoking status reviewed. Patient is former smoker.   Review of Systems See HPI.     Objective:  Physical Exam  Constitutional: She is oriented to person, place, and time and well-developed, well-nourished, and in no distress.  HENT:  Head: Normocephalic and atraumatic.  Pulmonary/Chest: Effort normal. No respiratory distress.  Genitourinary:  Genitourinary Comments: Exam performed with chaperone in room. Non-thrombosed  external hemorrhoids present.   Neurological: She is alert and oriented to person, place, and time. Gait normal.  Skin:  Small stage 1 sacral ulcer present. Mild erythema however no drainage, no signs of infection. Non-tender to palpation.   Psychiatric: Affect and judgment normal.      Assessment & Plan:  Diabetic neuropathy associated with type 2 diabetes mellitus A1C increased to 8.2 today, up from 7.1 at last visit 7 months ago. Likely due to patient's reported dietary changes, with increased carbohydrate intake. As patient with identifiable cause for elevated A1C, will not make insulin changes today, and will rather focus on resuming healthier diet that she was following previously. No hypoglycemic episodes reported. Neuropathy not currently well-controlled with low dose of gabapentin 100mg  BID, so will increase.  - Continue Lantus 15U with additional 5U for CBG >500 - Increase gabapentin to 300mg  BID - F/u with ophtho next month as planned - F/u in three months for repeat A1C. Consider increasing insulin if A1C not improving or worsening.  - Continue metformin BID   Nausea without vomiting Infrequent and not particularly bothersome to patient. As such, will continue to monitor with no intervention currently indicated.   External hemorrhoids Likely cause of patient's reported pruritis. Not thrombosed.  - Anusol BID  Sacral ulcer ([place]) Stage 1. Currently not painful to patient. Discussed with Dr. Erin Hearing, who recommended reducing friction with use of Vaseline. Discussed this as well as importance of remaining mobile and frequent repositioning when sitting or laying down.  - Apply Vaseline BID - F/u if worsening  or no improvement or becomes painful - Will be sure to reassess at next visit    Precepted with Dr. Erin Hearing.   Adin Hector, MD, MPH PGY-3 [facility] Pager [phone]

## 2017-03-16 NOTE — Assessment & Plan Note (Signed)
Likely cause of patient's reported pruritis. Not thrombosed.  - Anusol BID

## 2017-03-23 DIAGNOSIS — H401123 Primary open-angle glaucoma, left eye, severe stage: Secondary | ICD-10-CM | POA: Diagnosis not present

## 2017-03-23 DIAGNOSIS — E113293 Type 2 diabetes mellitus with mild nonproliferative diabetic retinopathy without macular edema, bilateral: Secondary | ICD-10-CM | POA: Diagnosis not present

## 2017-03-23 DIAGNOSIS — H401112 Primary open-angle glaucoma, right eye, moderate stage: Secondary | ICD-10-CM | POA: Diagnosis not present

## 2017-04-10 ENCOUNTER — Other Ambulatory Visit: Payer: Self-pay | Admitting: Internal Medicine

## 2017-06-06 ENCOUNTER — Other Ambulatory Visit: Payer: Self-pay | Admitting: Internal Medicine

## 2017-07-03 ENCOUNTER — Other Ambulatory Visit: Payer: Self-pay | Admitting: Internal Medicine

## 2017-07-04 ENCOUNTER — Other Ambulatory Visit: Payer: Self-pay | Admitting: Internal Medicine

## 2017-07-12 ENCOUNTER — Ambulatory Visit: Payer: Medicare Other | Admitting: Internal Medicine

## 2017-07-17 ENCOUNTER — Other Ambulatory Visit: Payer: Self-pay

## 2017-07-17 ENCOUNTER — Ambulatory Visit (INDEPENDENT_AMBULATORY_CARE_PROVIDER_SITE_OTHER): Payer: Medicare Other | Admitting: Internal Medicine

## 2017-07-17 ENCOUNTER — Encounter: Payer: Self-pay | Admitting: Internal Medicine

## 2017-07-17 VITALS — BP 166/90 | HR 95 | Temp 98.4°F | Ht 62.0 in | Wt 149.6 lb

## 2017-07-17 DIAGNOSIS — I10 Essential (primary) hypertension: Secondary | ICD-10-CM

## 2017-07-17 DIAGNOSIS — E1142 Type 2 diabetes mellitus with diabetic polyneuropathy: Secondary | ICD-10-CM

## 2017-07-17 DIAGNOSIS — L98421 Non-pressure chronic ulcer of back limited to breakdown of skin: Secondary | ICD-10-CM | POA: Diagnosis not present

## 2017-07-17 DIAGNOSIS — E1140 Type 2 diabetes mellitus with diabetic neuropathy, unspecified: Secondary | ICD-10-CM

## 2017-07-17 DIAGNOSIS — Z794 Long term (current) use of insulin: Secondary | ICD-10-CM | POA: Diagnosis not present

## 2017-07-17 LAB — POCT GLYCOSYLATED HEMOGLOBIN (HGB A1C): Hemoglobin A1C: 6.8

## 2017-07-17 NOTE — Assessment & Plan Note (Signed)
BP elevated to 166/90 in office today, however patient has not taken medication today. Is taking enalapril as prescribed.  - Continue enalapril - BMP today to monitor Cr

## 2017-07-17 NOTE — Progress Notes (Signed)
   Subjective:   Patient: Hayley Fisher       Birthdate: February 06, 1931       MRN: [id number]      HPI  Hayley Fisher is a 83 y.o. female presenting for f/u of DM, HTN, and sacral ulcer.   DM Has been taking Lantus 15U daily, as well as an additional 5U if blood sugar >150. Is prescribed additional 5U if blood sugar >500. Says she has only taken the additional 5 units about three times in the past few months. Checks blood sugar once a day around 7AM prior to eating breakfast. Readings typically in 100s. Blood sugar was 77 today. Patient said she felt well and did not feel dizzy or lightheaded. Saw ophtho a couple months ago as recommended. Was told there were no issues. Is still taking gabapentin. Was confused and thought she should increase dose to 600mg  BID after last visit. Michela Pitcher this made her feel "drunk", so she resumed 300mg  BID and is doing well on this dose.   HTN Does not check BP at home. Did not take medication today. Denies recurrent headaches. Denies vision changes.   Sacral ulcer Noted at last visit in 07/18. Stage 1 at that time. Encouraged patient to begin using Vaseline on the affected area. Patient has continued to do this, and has also been using hydrocortisone cream twice daily as well. Thinks that it is improving. Endorses minimal pain and only occasionally if she is in a certain position. Denies drainage or bleeding. Denies fevers, chills.   Smoking status reviewed. Patient is former smoker.   Review of Systems See HPI.     Objective:  Physical Exam  Constitutional: She is oriented to person, place, and time and well-developed, well-nourished, and in no distress.  HENT:  Head: Normocephalic and atraumatic.  Eyes: Conjunctivae and EOM are normal. Right eye exhibits no discharge. Left eye exhibits no discharge.  Pulmonary/Chest: Effort normal. No respiratory distress.  Neurological: She is alert and oriented to person, place, and time.  Able to walk, sit, and stand with  walker without difficulty  Skin:  Sacral ulcer in gluteal cleft on R no longer present. Small patch of dry skin at site of ulcer, however no ulceration or skin breakdown present at this time. Skin otherwise warm and dry.   Psychiatric: Affect and judgment normal.      Assessment & Plan:  Sacral ulcer ([place]) Now resolved. Only small area of dry skin noted at site with no ulceration or skin breakdown. Patient can continue putting Vaseline on the area for comfort and to reduce friction. Does not need to continue applying hydrocortisone.   HYPERTENSION, BENIGN SYSTEMIC BP elevated to 166/90 in office today, however patient has not taken medication today. Is taking enalapril as prescribed.  - Continue enalapril - BMP today to monitor Cr  Diabetic neuropathy associated with type 2 diabetes mellitus Well-controlled. A1C improved today to 6.8 (down from 8 in July). Neuropathy well-controlled on current dose of gabapentin. No reported episodes of hypoglycemia. Up to do with ophtho exam.  - Continue Lantus 15U qd with additional 5U if blood sugar >500 (clarified that she does not need to take 5U for blood sugar >150) - Continue metformin BID - Continue gabapentin 300mg  BID - F/u in 3 months for repeat A1C   Adin Hector, MD, MPH PGY-3 [place] Pager [phone]

## 2017-07-17 NOTE — Assessment & Plan Note (Signed)
Now resolved. Only small area of dry skin noted at site with no ulceration or skin breakdown. Patient can continue putting Vaseline on the area for comfort and to reduce friction. Does not need to continue applying hydrocortisone.

## 2017-07-17 NOTE — Patient Instructions (Addendum)
It was nice seeing you again today Ms. Strider!  Please continue taking 15 units of Lantus as you have been. Only take the 5 additional units if your blood sugar is greater than 500. Continue taking gabapentin 300 mg twice a day and metformin twice a day.   You can stop putting hydrocortisone cream on the ulcer and begin using only Vaseline.   Please continue taking all of your other medications as you have been.   We will see you back in three months to check your A1C again to see how your diabetes is doing.   If you have any questions or concerns, please feel free to call the clinic.   Be well,  Dr. Avon Gully

## 2017-07-17 NOTE — Assessment & Plan Note (Signed)
Well-controlled. A1C improved today to 6.8 (down from 8 in July). Neuropathy well-controlled on current dose of gabapentin. No reported episodes of hypoglycemia. Up to do with ophtho exam.  - Continue Lantus 15U qd with additional 5U if blood sugar >500 (clarified that she does not need to take 5U for blood sugar >150) - Continue metformin BID - Continue gabapentin 300mg  BID - F/u in 3 months for repeat A1C

## 2017-07-18 LAB — BASIC METABOLIC PANEL
BUN / CREAT RATIO: 19 (ref 12–28)
BUN: 14 mg/dL (ref 8–27)
CO2: 27 mmol/L (ref 20–29)
CREATININE: 0.75 mg/dL (ref 0.57–1.00)
Calcium: 9.4 mg/dL (ref 8.7–10.3)
Chloride: 102 mmol/L (ref 96–106)
GFR calc Af Amer: 85 mL/min/{1.73_m2} (ref >59)
GFR, EST NON AFRICAN AMERICAN: 73 mL/min/{1.73_m2} (ref >59)
GLUCOSE: 76 mg/dL (ref 65–99)
Potassium: 4.7 mmol/L (ref 3.5–5.2)
Sodium: 143 mmol/L (ref 134–144)

## 2017-08-01 ENCOUNTER — Other Ambulatory Visit: Payer: Self-pay | Admitting: Internal Medicine

## 2017-08-01 DIAGNOSIS — I10 Essential (primary) hypertension: Secondary | ICD-10-CM

## 2017-08-02 ENCOUNTER — Other Ambulatory Visit: Payer: Self-pay | Admitting: Internal Medicine

## 2017-08-02 DIAGNOSIS — E119 Type 2 diabetes mellitus without complications: Secondary | ICD-10-CM

## 2017-08-05 ENCOUNTER — Other Ambulatory Visit: Payer: Self-pay | Admitting: Family Medicine

## 2017-08-05 DIAGNOSIS — IMO0001 Reserved for inherently not codable concepts without codable children: Secondary | ICD-10-CM

## 2017-08-05 DIAGNOSIS — E119 Type 2 diabetes mellitus without complications: Principal | ICD-10-CM

## 2017-08-05 DIAGNOSIS — Z794 Long term (current) use of insulin: Principal | ICD-10-CM

## 2017-09-29 DIAGNOSIS — H401112 Primary open-angle glaucoma, right eye, moderate stage: Secondary | ICD-10-CM | POA: Diagnosis not present

## 2017-09-29 DIAGNOSIS — E113313 Type 2 diabetes mellitus with moderate nonproliferative diabetic retinopathy with macular edema, bilateral: Secondary | ICD-10-CM | POA: Diagnosis not present

## 2017-10-27 DIAGNOSIS — H401112 Primary open-angle glaucoma, right eye, moderate stage: Secondary | ICD-10-CM | POA: Diagnosis not present

## 2017-10-27 DIAGNOSIS — H401123 Primary open-angle glaucoma, left eye, severe stage: Secondary | ICD-10-CM | POA: Diagnosis not present

## 2017-10-30 ENCOUNTER — Other Ambulatory Visit: Payer: Self-pay | Admitting: Internal Medicine

## 2017-10-30 DIAGNOSIS — E1140 Type 2 diabetes mellitus with diabetic neuropathy, unspecified: Secondary | ICD-10-CM

## 2017-11-15 ENCOUNTER — Ambulatory Visit: Payer: Medicare Other | Admitting: Internal Medicine

## 2017-11-16 ENCOUNTER — Other Ambulatory Visit: Payer: Self-pay

## 2017-11-16 ENCOUNTER — Encounter: Payer: Self-pay | Admitting: Internal Medicine

## 2017-11-16 ENCOUNTER — Ambulatory Visit (INDEPENDENT_AMBULATORY_CARE_PROVIDER_SITE_OTHER): Payer: Medicare Other | Admitting: Internal Medicine

## 2017-11-16 VITALS — BP 150/80 | HR 97 | Temp 98.2°F | Wt 159.6 lb

## 2017-11-16 DIAGNOSIS — E1142 Type 2 diabetes mellitus with diabetic polyneuropathy: Secondary | ICD-10-CM

## 2017-11-16 DIAGNOSIS — Z794 Long term (current) use of insulin: Secondary | ICD-10-CM

## 2017-11-16 DIAGNOSIS — R350 Frequency of micturition: Secondary | ICD-10-CM | POA: Diagnosis not present

## 2017-11-16 DIAGNOSIS — I10 Essential (primary) hypertension: Secondary | ICD-10-CM

## 2017-11-16 DIAGNOSIS — IMO0001 Reserved for inherently not codable concepts without codable children: Secondary | ICD-10-CM

## 2017-11-16 DIAGNOSIS — E1140 Type 2 diabetes mellitus with diabetic neuropathy, unspecified: Secondary | ICD-10-CM | POA: Diagnosis not present

## 2017-11-16 DIAGNOSIS — E119 Type 2 diabetes mellitus without complications: Secondary | ICD-10-CM

## 2017-11-16 DIAGNOSIS — N390 Urinary tract infection, site not specified: Secondary | ICD-10-CM | POA: Diagnosis not present

## 2017-11-16 LAB — POCT GLYCOSYLATED HEMOGLOBIN (HGB A1C): Hemoglobin A1C: 7.3

## 2017-11-16 LAB — POCT UA - MICROSCOPIC ONLY: WBC, Ur, HPF, POC: >20

## 2017-11-16 LAB — POCT URINALYSIS DIP (MANUAL ENTRY)
Bilirubin, UA: NEGATIVE
Glucose, UA: 250 mg/dL — AB
Ketones, POC UA: NEGATIVE mg/dL
Nitrite, UA: NEGATIVE
PROTEIN UA: NEGATIVE mg/dL
Urobilinogen, UA: 0.2 E.U./dL
pH, UA: 6 (ref 5.0–8.0)

## 2017-11-16 MED ORDER — DULOXETINE HCL 60 MG PO CPEP: 60.0000 mg | ORAL_CAPSULE | Freq: Every day | ORAL | 1 refills | Status: DC

## 2017-11-16 MED ORDER — SULFAMETHOXAZOLE-TRIMETHOPRIM 400-80 MG PO TABS: 1.0000 | ORAL_TABLET | Freq: Two times a day (BID) | ORAL | 0 refills | Status: DC

## 2017-11-16 NOTE — Assessment & Plan Note (Signed)
A1C slightly increased from 06/2017 to 7.3 today, however still at goal for patient's age. Will continue current insulin regimen of Lantus 15U qd, with extra 5U if blood sugar >150. No abnormalities noted on foot exam today, and following closely with ophtho. F/u in 3 mo for repeat A1C.

## 2017-11-16 NOTE — Assessment & Plan Note (Signed)
BP at goal for age at 150/80. BMP checked at last visit with Cr WNL so will not repeat today.  - Continue enalapril

## 2017-11-16 NOTE — Assessment & Plan Note (Signed)
Urinary odor only, no dysuria. UA with leuks. Will treat with 3d course of Bactrim. Unclear if patient still following with [facility]. If has another UTI at next appt, would inquire about this and encourage her to reestablish care with them.

## 2017-11-16 NOTE — Assessment & Plan Note (Signed)
Poorly controlled with gabapentin 300mg  BID. Cannot increase dose further, as patient had significant side effects when increased at last appt. As sx becoming more troublesome, patient would like to try a different medication to see if this would be more effective. Will taper off gabapentin and begin Cymbalta (chosen over Lyrica as safer in geriatric pts). Decrease gabapentin to one 300mg  tab for next 7d, then stop altogether and begin Cymbalta 60mg  qd. F/u at next appt in 65mo, however encouraged pt to call sooner if having SE or worsening of sx.

## 2017-11-16 NOTE — Progress Notes (Signed)
Subjective:   Patient: Hayley Fisher       Birthdate: [birth year]       MRN: [id number]      HPI  Hayley Fisher is a 84 y.o. female presenting for f/u of DM, HTN, and malodorous urine.   Malodorous urine Began about 2w ago. Denies any other urinary sx, including dysuria, hematuria. Says that urine is not darker than usual. Denies abd pain, fevers, vomiting. Sometimes has N first thing in the AM but says this is not new. Has had flatulence recently but also not a new issue.   DM Blood sugars have been higher at home recently which she attributes to eating more starches. Says she has not started eating more starches for any particular reason, she just has. Highest blood sugar around 200. Lowest around 70. Endorses numbness and tingling in hands; denies sx in feet. Says this is not a new thing, but is becoming more bothersome, as she is now having difficulty writing. Is followed closely by ophtho as she has glaucoma. Has next ophtho appt in a couple weeks.   BP Taking enalapril as prescribed. Does not check BP at home. Denies headaches, changes in vision, dizziness, lightheadedness.   Smoking status reviewed. Patient is former smoker.   Review of Systems See HPI.     Objective:  Physical Exam  Constitutional: She is oriented to person, place, and time and well-developed, well-nourished, and in no distress.  HENT:  Head: Normocephalic and atraumatic.  Eyes: Pupils are equal, round, and reactive to light. EOM are normal. Right eye exhibits no discharge. Left eye exhibits no discharge. No scleral icterus.  Cardiovascular: Normal rate, regular rhythm and normal heart sounds.  No murmur heard. Pulmonary/Chest: Effort normal and breath sounds normal. No respiratory distress. She has no wheezes.  Neurological: She is alert and oriented to person, place, and time.  Skin: Skin is warm and dry.  Psychiatric: Affect and judgment normal.    Diabetic Foot Exam - Simple   Simple Foot  Form Diabetic Foot exam was performed with the following findings:  Yes 11/16/2017 12:06 PM  Visual Inspection No deformities, no ulcerations, no other skin breakdown bilaterally:  Yes Sensation Testing Intact to touch and monofilament testing bilaterally:  Yes Pulse Check Comments      Assessment & Plan:  HYPERTENSION, BENIGN SYSTEMIC BP at goal for age at 150/80. BMP checked at last visit with Cr WNL so will not repeat today.  - Continue enalapril  Diabetic neuropathy associated with type 2 diabetes mellitus Poorly controlled with gabapentin 300mg  BID. Cannot increase dose further, as patient had significant side effects when increased at last appt. As sx becoming more troublesome, patient would like to try a different medication to see if this would be more effective. Will taper off gabapentin and begin Cymbalta (chosen over Lyrica as safer in geriatric pts). Decrease gabapentin to one 300mg  tab for next 7d, then stop altogether and begin Cymbalta 60mg  qd. F/u at next appt in 24mo, however encouraged pt to call sooner if having SE or worsening of sx.   IDDM (insulin dependent diabetes mellitus) ([place]) A1C slightly increased from 06/2017 to 7.3 today, however still at goal for patient's age. Will continue current insulin regimen of Lantus 15U qd, with extra 5U if blood sugar >150. No abnormalities noted on foot exam today, and following closely with ophtho. F/u in 3 mo for repeat A1C.   Recurrent UTI Urinary odor only, no dysuria. UA with leuks. Will treat with  3d course of Bactrim. Unclear if patient still following with [facility]. If has another UTI at next appt, would inquire about this and encourage her to reestablish care with them.     Adin Hector, MD, MPH PGY-3 [facility] Pager [phone]

## 2017-11-16 NOTE — Patient Instructions (Addendum)
It was nice seeing you again today Hayley Fisher!  For your urinary tract infection, please begin taking Bactrim (antibiotic) one tablet twice a day for the next 3 days. If you are still having symptoms after finishing the antibiotic, please call to let us know.   Today we decided to stop taking gabapentin and begin taking the new medicine to help with nerve pain, Cymbalta.  Begin only taking one gabapentin tablet (300 mg) a day for the next 7 days. After that, you can stop taking gabapentin altogether, and begin taking one Cymbalta tablet a day (60 mg).   I will see you back in 3 months to see how you are doing.   If you have any questions or concerns, please feel free to call the clinic.   Be well,  Dr. Avon Gully

## 2017-11-17 ENCOUNTER — Telehealth: Payer: Self-pay | Admitting: Internal Medicine

## 2017-11-17 MED ORDER — DULOXETINE HCL 60 MG PO CPEP: 60.0000 mg | ORAL_CAPSULE | Freq: Every day | ORAL | 1 refills | Status: DC

## 2017-11-17 MED ORDER — SULFAMETHOXAZOLE-TRIMETHOPRIM 400-80 MG PO TABS: 1.0000 | ORAL_TABLET | Freq: Two times a day (BID) | ORAL | 0 refills | Status: DC

## 2017-11-17 NOTE — Telephone Encounter (Signed)
Pt was seen yesterday by Avon Gully and got 2 medications filled. Cymbalta and Bactrim. They were both sent to the wrong pharmacy. Her pharmacy is [facility] and that's the only pharmacy she uses. She would like these meds sent to [place] [facility] so she can get them. Please advise

## 2017-11-17 NOTE — Telephone Encounter (Signed)
Rx resent as requested. Pt informed and Rx canceled at previous pharmacy. Fleeger, Salome Spotted, CMA

## 2017-12-09 ENCOUNTER — Emergency Department (HOSPITAL_COMMUNITY)
Admission: EM | Admit: 2017-12-09 | Discharge: 2017-12-09 | Disposition: A | Discharge status: Home / Self Care | Payer: Medicare Other | Attending: Emergency Medicine | Admitting: Emergency Medicine

## 2017-12-09 ENCOUNTER — Emergency Department (HOSPITAL_COMMUNITY): Payer: Medicare Other

## 2017-12-09 ENCOUNTER — Other Ambulatory Visit: Payer: Self-pay

## 2017-12-09 ENCOUNTER — Encounter (HOSPITAL_COMMUNITY): Payer: Self-pay | Admitting: *Deleted

## 2017-12-09 DIAGNOSIS — I129 Hypertensive chronic kidney disease with stage 1 through stage 4 chronic kidney disease, or unspecified chronic kidney disease: Secondary | ICD-10-CM | POA: Diagnosis not present

## 2017-12-09 DIAGNOSIS — Z87891 Personal history of nicotine dependence: Secondary | ICD-10-CM | POA: Insufficient documentation

## 2017-12-09 DIAGNOSIS — Z79899 Other long term (current) drug therapy: Secondary | ICD-10-CM | POA: Diagnosis not present

## 2017-12-09 DIAGNOSIS — R112 Nausea with vomiting, unspecified: Secondary | ICD-10-CM | POA: Insufficient documentation

## 2017-12-09 DIAGNOSIS — N189 Chronic kidney disease, unspecified: Secondary | ICD-10-CM | POA: Diagnosis not present

## 2017-12-09 DIAGNOSIS — Z794 Long term (current) use of insulin: Secondary | ICD-10-CM | POA: Diagnosis not present

## 2017-12-09 DIAGNOSIS — K5900 Constipation, unspecified: Secondary | ICD-10-CM | POA: Insufficient documentation

## 2017-12-09 DIAGNOSIS — E119 Type 2 diabetes mellitus without complications: Secondary | ICD-10-CM | POA: Diagnosis not present

## 2017-12-09 DIAGNOSIS — R109 Unspecified abdominal pain: Secondary | ICD-10-CM | POA: Diagnosis not present

## 2017-12-09 DIAGNOSIS — J069 Acute upper respiratory infection, unspecified: Secondary | ICD-10-CM | POA: Diagnosis not present

## 2017-12-09 DIAGNOSIS — Z7982 Long term (current) use of aspirin: Secondary | ICD-10-CM | POA: Diagnosis not present

## 2017-12-09 LAB — COMPREHENSIVE METABOLIC PANEL WITH GFR
ALT: 23 U/L (ref 14–54)
AST: 25 U/L (ref 15–41)
Albumin: 3.5 g/dL (ref 3.5–5.0)
Alkaline Phosphatase: 62 U/L (ref 38–126)
Anion gap: 9 (ref 5–15)
BUN: 12 mg/dL (ref 6–20)
CO2: 26 mmol/L (ref 22–32)
Calcium: 8.5 mg/dL — ABNORMAL LOW (ref 8.9–10.3)
Chloride: 97 mmol/L — ABNORMAL LOW (ref 101–111)
Creatinine, Ser: 0.59 mg/dL (ref 0.44–1.00)
GFR calc Af Amer: >60 mL/min
GFR calc non Af Amer: >60 mL/min
Glucose, Bld: 304 mg/dL — ABNORMAL HIGH (ref 65–99)
Potassium: 4.2 mmol/L (ref 3.5–5.1)
Sodium: 132 mmol/L — ABNORMAL LOW (ref 135–145)
Total Bilirubin: 1 mg/dL (ref 0.3–1.2)
Total Protein: 6.5 g/dL (ref 6.5–8.1)

## 2017-12-09 LAB — URINALYSIS, ROUTINE W REFLEX MICROSCOPIC
Bilirubin Urine: NEGATIVE
Glucose, UA: >=500 mg/dL — AB
Hgb urine dipstick: NEGATIVE
Ketones, ur: NEGATIVE mg/dL
Nitrite: NEGATIVE
Protein, ur: NEGATIVE mg/dL
Specific Gravity, Urine: 1.013 (ref 1.005–1.030)
pH: 5 (ref 5.0–8.0)

## 2017-12-09 LAB — CBC
HCT: 36.3 % (ref 36.0–46.0)
Hemoglobin: 12.1 g/dL (ref 12.0–15.0)
MCH: 30.9 pg (ref 26.0–34.0)
MCHC: 33.3 g/dL (ref 30.0–36.0)
MCV: 92.8 fL (ref 78.0–100.0)
Platelets: 180 K/uL (ref 150–400)
RBC: 3.91 MIL/uL (ref 3.87–5.11)
RDW: 13.2 % (ref 11.5–15.5)
WBC: 4.1 K/uL (ref 4.0–10.5)

## 2017-12-09 LAB — LIPASE, BLOOD: Lipase: 133 U/L — ABNORMAL HIGH (ref 11–51)

## 2017-12-09 LAB — CBG MONITORING, ED: Glucose-Capillary: 207 mg/dL — ABNORMAL HIGH (ref 65–99)

## 2017-12-09 MED ORDER — ENALAPRIL MALEATE 10 MG PO TABS
20.0000 mg | ORAL_TABLET | Freq: Every day | ORAL | Status: DC
  Administered 2017-12-09: 20 mg via ORAL
  Filled 2017-12-09: qty 2

## 2017-12-09 MED ORDER — SODIUM CHLORIDE 0.9 % IJ SOLN
INTRAMUSCULAR | Status: AC
  Administered 2017-12-09: 23:00:00
  Filled 2017-12-09: qty 50

## 2017-12-09 MED ORDER — CEPHALEXIN 500 MG PO CAPS: 500.0000 mg | ORAL_CAPSULE | Freq: Two times a day (BID) | ORAL | 0 refills | Status: AC

## 2017-12-09 MED ORDER — IOPAMIDOL (ISOVUE-300) INJECTION 61%
INTRAVENOUS | Status: AC
  Administered 2017-12-09: 23:00:00
  Filled 2017-12-09: qty 100

## 2017-12-09 MED ORDER — IOPAMIDOL (ISOVUE-300) INJECTION 61%
100.0000 mL | Freq: Once | INTRAVENOUS | Status: AC | PRN
  Administered 2017-12-09: 100 mL via INTRAVENOUS

## 2017-12-09 MED ORDER — BENZONATATE 100 MG PO CAPS: 100.0000 mg | ORAL_CAPSULE | Freq: Three times a day (TID) | ORAL | 0 refills | Status: DC

## 2017-12-09 MED ORDER — ENALAPRIL MALEATE 10 MG PO TABS: 20.0000 mg | ORAL_TABLET | Freq: Every day | ORAL | Status: DC

## 2017-12-09 MED ORDER — SODIUM CHLORIDE 0.9 % IV BOLUS
500.0000 mL | Freq: Once | INTRAVENOUS | Status: AC
Start: 2017-12-09 — End: 2017-12-09
  Administered 2017-12-09: 500 mL via INTRAVENOUS

## 2017-12-09 NOTE — ED Notes (Signed)
Patient transported to CT 

## 2017-12-09 NOTE — Discharge Instructions (Signed)
You were given a prescription for antibiotics. Please take the antibiotic prescription fully.  You are also given a perception for benzonatate which can help with your cough.  I have prescribed a new medication for you today. It is important that when you pick the prescription up you discuss the potential interactions of this medication with other medications you are taking, including over the counter medications, with the pharmacists.   This new medication has potential side effects. Be sure to contact your primary care provider or return to the emergency department if you are experiencing new symptoms that you are unable to tolerate after starting the medication. You need to receive medical evaluation immediately if you start to experience blistering of the skin, rash, swelling, or difficulty breathing as these signs could indicate a more serious medication side effect.   You were given a referral for a gynecology office and you should call the office to schedule an appointment for follow-up about the fluid collection that was found on your CT scan.  You should follow-up with your primary care doctor in 1 week for reevaluation.  You should return to the emergency department immediately if you have any new or worsening symptoms.

## 2017-12-09 NOTE — ED Provider Notes (Signed)
[facility] DEPT Provider Note   CSN: [id number] Arrival date & time: 12/09/17  1828     History   Chief Complaint Chief Complaint  Patient presents with  . Emesis  . Weakness    HPI Hayley Fisher is a 84 y.o. female.  HPI   Patient is an 84 year old female with history of CKD, diabetes, hyperlipidemia, hypertension who presents the ED today with multiple complaints.  States that she started to have nausea 4 days ago after she ate dinner.  Had one episode of emesis that was nonbloody.  No Further episodes.  She has continued to feel nauseated since that time.  She also developed a cough on 4 days ago.  She has had sputum production that is yellow/clear.  No bloody sputum production.  She denies any associated shortness of breath.  She does endorse diffuse chest wall pain and abdominal pain with coughing only.  She has no chest pain when she does not cough.  She has no chest pain when she takes a deep breath.  She has had no fevers.  She does endorse rhinorrhea and congestion.  No sore throat or bilateral ear pain.  No abdominal pain or back pain. Reporting frequency but no dysuria or hematuria.  Patient has not taken her blood pressure medications today.  Past Medical History:  Diagnosis Date  . Arthritis   . Arthritis   . Cataract    bil cateracts removed  . Chronic kidney disease    "spot on one of my kidneys" per pt  . Diabetes mellitus   . Glaucoma   . Hyperlipidemia   . Hyperplastic colon polyp   . Hypertension   . Syncope     Patient Active Problem List   Diagnosis Date Noted  . Nausea without vomiting 03/16/2017  . External hemorrhoids 03/16/2017  . Sacral ulcer ([place]) 03/16/2017  . Abnormal sensation of buttocks 03/25/2016  . Spinal stenosis of lumbar region 01/13/2016  . Chronic venous insufficiency 11/09/2015  . Family hx of colon cancer 07/23/2015  . IDDM (insulin dependent diabetes mellitus) ([place]) 07/23/2015  . Piriformis  syndrome of right side 01/23/2015  . DDD (degenerative disc disease), lumbosacral 11/24/2014  . Constipation 11/24/2014  . Dark stools 11/24/2014  . Acute lower UTI 07/03/2014  . Sciatic pain 01/01/2014  . Cough 01/31/2013  . Tenesmus (rectal) 12/21/2012  . Recurrent UTI 10/29/2012  . Groin pain 08/06/2012  . Vertigo-Dizzy, vague feeling 11/16/2011  . Heart palpitations 11/16/2011  . ONYCHOMYCOSIS, TOENAILS 04/03/2009  . Diabetic neuropathy associated with type 2 diabetes mellitus ([place]) 04/03/2009  . ALOPECIA 11/02/2007  . COLONIC POLYPS, HYPERPLASTIC 02/16/2007  . Type 2 diabetes mellitus with diabetic neuropathy ([place]) 10/19/2006  . Pure hypercholesterolemia 10/19/2006  . OBESITY, NOS 10/19/2006  . GLAUCOMA 10/19/2006  . HYPERTENSION, BENIGN SYSTEMIC 10/19/2006  . OSTEOARTHRITIS, LOWER LEG 10/19/2006  . OSTEOPENIA 10/19/2006    Past Surgical History:  Procedure Laterality Date  . ABDOMINAL HYSTERECTOMY    . BLADDER SUSPENSION    . bladder tacking    . COLONOSCOPY    . EYE SURGERY    . INGUINAL HERNIA REPAIR Right      OB History   None      Home Medications    Prior to Admission medications   Medication Sig Start Date End Date Taking? Authorizing Provider  ACCU-CHEK AVIVA PLUS test strip USE TO TEST BLOOD SUGAR UP  TO 3 TIMES A DAY. 10/31/17  Yes Verner Mould, MD  ACCU-CHEK SOFTCLIX LANCETS lancets USE TO TEST BLOOD SUGAR UP  TO 3 TIMES A DAY. 10/31/17  Yes Verner Mould, MD  aspirin 81 MG chewable tablet Chew 81 mg by mouth every morning.    Yes [provider]  atorvastatin (LIPITOR) 40 MG tablet TAKE 1 TABLET BY MOUTH EVERY DAY 07/03/17  Yes Verner Mould, MD  BD INSULIN SYRINGE ULTRAFINE 31G X 15/64" 0.3 ML MISC USE TO INJECT daily WITH EACH insulin injection. 12/14/15  Yes Karamalegos, Devonne Doughty, DO  Blood Glucose Monitoring Suppl (ACCU-CHEK AVIVA PLUS) w/Device KIT Use to test blood sugar up to 3 times a day. ICD-10  code: E11.40. 09/02/16  Yes Verner Mould, MD  brimonidine (ALPHAGAN P) 0.1 % SOLN Place 1 drop into the left eye 2 (two) times daily.  09/20/11  Yes   DULoxetine (CYMBALTA) 60 MG capsule Take 1 capsule (60 mg total) by mouth daily. 11/17/17  Yes Verner Mould, MD  enalapril (VASOTEC) 20 MG tablet TAKE 1 TABLET BY MOUTH EVERY DAY 08/02/17  Yes Verner Mould, MD  guaiFENesin (DIABETIC TUSSIN EX) 100 MG/5ML liquid Take 200 mg by mouth 3 (three) times daily as needed for cough.   Yes [provider]  Insulin Syringe-Needle U-100 (BD INSULIN SYRINGE U/F) 31G X 5/16" 0.3 ML MISC Use with insulin daily 08/02/17  Yes Verner Mould, MD  Lancets [facility] ULTRASOFT) lancets Check blood sugar up to 3 times daily as instructed. 09/10/15  Yes Karamalegos, Devonne Doughty, DO  Lancets Misc. (ACCU-CHEK SOFTCLIX LANCET DEV) KIT Use as directed. ICD-10 code: E11.40. 09/02/16  Yes Verner Mould, MD  LANTUS 100 UNIT/ML injection Inject 10-20 units below the skin daily Patient taking differently: Inject 15 units below the skin daily 01/20/17  Yes Verner Mould, MD  latanoprost (XALATAN) 0.005 % ophthalmic solution Place 1 drop into the left eye at bedtime.  09/14/12  Yes [provider]  metFORMIN (GLUCOPHAGE) 500 MG tablet TAKE 1 TABLET BY MOUTH 2 TIMES DAILY WITH FOOD 06/06/17  Yes Verner Mould, MD  Multiple Vitamins-Minerals (PRESERVISION AREDS PO) Take 1 tablet by mouth every morning.    Yes [provider]  polyethylene glycol powder (GLYCOLAX/MIRALAX) powder Take 17 g by mouth 2 (two) times daily as needed. 11/24/14  Yes Street, Sharon Mt, MD  VYZULTA 0.024 % SOLN Place 1 drop into the right eye daily. 11/06/17  Yes [provider]  acetaminophen (TYLENOL) 500 MG tablet Take 1 tablet (500 mg total) by mouth every 8 (eight) hours as needed. Patient not taking: Reported on 12/09/2017 01/01/14   [place] Ege, Glennon Mac, MD  benzonatate (TESSALON) 100 MG capsule Take 1 capsule (100 mg total) by mouth every 8 (eight) hours. 12/09/17   Tamarius Rosenfield S, PA-C  cephALEXin (KEFLEX) 500 MG capsule Take 1 capsule (500 mg total) by mouth 2 (two) times daily for 5 days. 12/09/17 12/14/17  Abdalrahman Clementson S, PA-C  hydrocortisone 2.5 % cream Apply 1 application rectally 2 times daily Patient not taking: Reported on 12/09/2017 07/04/17   Verner Mould, MD  sulfamethoxazole-trimethoprim (BACTRIM) 400-80 MG tablet Take 1 tablet by mouth 2 (two) times daily. Patient not taking: Reported on 12/09/2017 11/17/17   Verner Mould, MD    Family History Family History  Problem Relation Age of Onset  . Colon cancer Mother   . Other Father        cerebral hemorrhage  . Diabetes Brother   .  Diabetes Sister        x 2  . Bone cancer Daughter   . Breast cancer Daughter   . Esophageal cancer Neg Hx   . Rectal cancer Neg Hx   . Stomach cancer Neg Hx     Social History Social History   Tobacco Use  . Smoking status: Former Smoker    Types: Cigarettes    Last attempt to quit: 11/29/1980    Years since quitting: 37.0  . Smokeless tobacco: Never Used  Substance Use Topics  . Alcohol use: No    Alcohol/week: 0.0 oz  . Drug use: No     Allergies   Levemir [insulin detemir]   Review of Systems Review of Systems  Constitutional: Negative for chills and fever.  HENT: Negative for ear pain and sore throat.   Eyes: Negative for visual disturbance.  Respiratory: Positive for cough.   Cardiovascular: Negative for chest pain.  Gastrointestinal: Positive for nausea. Negative for abdominal pain, blood in stool, constipation, diarrhea and vomiting.  Genitourinary: Positive for frequency. Negative for dysuria, hematuria and urgency.  Musculoskeletal: Negative for back pain.  Skin: Negative for rash.  Neurological: Negative for dizziness, light-headedness, numbness and headaches.  All other  systems reviewed and are negative.    Physical Exam Updated Vital Signs BP (!) 167/98 (BP Location: Left Arm)   Pulse 90   Temp 99 F (37.2 C) (Oral)   Resp 18   Ht '5\' 5"'  (1.651 m)   Wt 71.2 kg (157 lb)   SpO2 98%   BMI 26.13 kg/m   Physical Exam  Constitutional: She is oriented to person, place, and time. She appears well-developed and well-nourished. No distress.  HENT:  Head: Normocephalic and atraumatic.  Right Ear: External ear normal.  Left Ear: External ear normal.  Mouth/Throat: Oropharynx is clear and moist.  No pharyngeal erythema.  No tonsillar swelling or exudate.  Uvula midline.  No evidence of PTA.  Bilateral TMs without erythema or effusion.  No cervical adenopathy.  Nose normal.  Eyes: Pupils are equal, round, and reactive to light. Conjunctivae and EOM are normal.  Neck: Neck supple.  Cardiovascular: Normal rate, regular rhythm, normal heart sounds and intact distal pulses.  No murmur heard. Pulmonary/Chest: Effort normal and breath sounds normal. No respiratory distress. She has no wheezes.  Abdominal: Soft. Bowel sounds are normal. She exhibits no distension. There is no tenderness. There is no guarding.  Musculoskeletal: She exhibits no edema.  Neurological: She is alert and oriented to person, place, and time.  Skin: Skin is warm and dry.  Psychiatric: She has a normal mood and affect.  Nursing note and vitals reviewed.    ED Treatments / Results  Labs (all labs ordered are listed, but only abnormal results are displayed) Labs Reviewed  LIPASE, BLOOD - Abnormal; Notable for the following components:      Result Value   Lipase 133 (*)    All other components within normal limits  COMPREHENSIVE METABOLIC PANEL - Abnormal; Notable for the following components:   Sodium 132 (*)    Chloride 97 (*)    Glucose, Bld 304 (*)    Calcium 8.5 (*)    All other components within normal limits  URINALYSIS, ROUTINE W REFLEX MICROSCOPIC - Abnormal; Notable for  the following components:   APPearance HAZY (*)    Glucose, UA >=500 (*)    Leukocytes, UA SMALL (*)    Bacteria, UA MANY (*)    Squamous Epithelial /  LPF 0-5 (*)    All other components within normal limits  CBG MONITORING, ED - Abnormal; Notable for the following components:   Glucose-Capillary 207 (*)    All other components within normal limits  URINE CULTURE  CBC    EKG None  Radiology Dg Chest 2 View  Result Date: 12/09/2017 CLINICAL DATA:  Cough EXAM: CHEST - 2 VIEW COMPARISON:  09/18/2011 FINDINGS: Minimal left base atelectasis. Right lung clear. Heart is normal size. No effusions. No acute bony abnormality. IMPRESSION: Minimal left base atelectasis. Electronically Signed   By: Rolm Baptise M.D.   On: 12/09/2017 20:42   Ct Abdomen Pelvis W Contrast  Result Date: 12/09/2017 CLINICAL DATA:  Nausea and vomiting EXAM: CT ABDOMEN AND PELVIS WITH CONTRAST TECHNIQUE: Multidetector CT imaging of the abdomen and pelvis was performed using the standard protocol following bolus administration of intravenous contrast. CONTRAST:  139m ISOVUE-300 IOPAMIDOL (ISOVUE-300) INJECTION 61% COMPARISON:  Chest x-ray 12/09/2017, CT 06/21/2007 FINDINGS: Lower chest: Lung bases demonstrate no acute consolidation or effusion. Coronary vascular calcification. Hepatobiliary: No focal liver abnormality is seen. No gallstones, gallbladder wall thickening, or biliary dilatation. Pancreas: Unremarkable. No pancreatic ductal dilatation or surrounding inflammatory changes. Spleen: Normal in size without focal abnormality. Adrenals/Urinary Tract: Adrenal glands are within normal limits. Large 7.2 cm cyst mid left kidney. Distended urinary bladder. Stomach/Bowel: The stomach is nonenlarged. No dilated small bowel. Large volume of stool in the colon. Negative appendix. Vascular/Lymphatic: Moderate-to-marked aortic atherosclerosis. No aneurysmal dilatation. No significantly enlarged lymph nodes. Reproductive: Status post  hysterectomy. There appears to be complex fluid distension of the vaginal remnant. No adnexal mass. Other: Negative for free air or free fluid. Musculoskeletal: Advanced degenerative changes of the spine with 8 mm anterolisthesis L3 on L4 and grade 1 anterolisthesis of L4 on L5. IMPRESSION: 1. Negative for bowel obstruction or bowel wall thickening. Large volume of stool in the colon suggesting constipation 2. 7.2 cm cyst in the left kidney 3. Patient is status post hysterectomy. There appears to be new complex fluid distension of the upper vaginal remnant. Gynecologic consultation recommended. Electronically Signed   By: KDonavan FoilM.D.   On: 12/09/2017 23:00    Procedures Procedures (including critical care time)  Medications Ordered in ED Medications  enalapril (VASOTEC) tablet 20 mg (20 mg Oral Given 12/09/17 2305)  sodium chloride 0.9 % bolus 500 mL (0 mLs Intravenous Stopped 12/09/17 2337)  iopamidol (ISOVUE-300) 61 % injection (  Contrast Given 12/09/17 2322)  sodium chloride 0.9 % injection (  Given 12/09/17 2323)  iopamidol (ISOVUE-300) 61 % injection 100 mL (100 mLs Intravenous Contrast Given 12/09/17 2226)     Initial Impression / Assessment and Plan / ED Course  I have reviewed the triage vital signs and the nursing notes.  Pertinent labs & imaging results that were available during my care of the patient were reviewed by me and considered in my medical decision making (see chart for details).     Final Clinical Impressions(s) / ED Diagnoses   Final diagnoses:  Non-intractable vomiting with nausea, unspecified vomiting type  Upper respiratory tract infection, unspecified type  Constipation, unspecified constipation type   Patient presented with URI symptoms as well as nausea and one episode of vomiting last week.  Patient mildly hypertensive and not take her blood pressure medications today.  No evidence of endorgan damage.  Otherwise her vital signs are stable and she is  afebrile.  He is nontoxic appearing in no acute distress.  Pulmonary exam is benign.  Cardiac abdominal exam is also benign.  Labs reviewed in significant for elevated lipase.  CMP grossly benign however elevated glucose but no elevated anion gap no clinical signs or symptoms to suggest DKA.  UA negative for ketones.  Does have leukocytes, will send for culture.  CBC without leukocytosis or anemia.  Chest x-ray negative for pneumonia.  CT negative for acute intra-abdominal process.  Large volume stool in the colon suggesting constipation.  Also new complex fluid distention of her vaginal remnant recommended [place] consult.  Given hysterectomy was completed greater than 15 years ago suspect that this is not a new finding.  Gave referral to gynecology and communicated results to patient.  Suspect that patient's nausea and vomiting could be due to her constipation advised her to take MiraLAX at home.  Gave her cough medication for cold as well as Rx for antibiotic to treat her UTI.  Urine culture sent.  Advised patient follow-up with PCP next week and also have repeat blood pressure rechecked at that time.  Gave strict return precautions for any new or worsening symptoms.  All questions answered and patient and her family member at bedside understands plan and reasons to return immediately to the ED.   ED Discharge Orders        Ordered    cephALEXin (KEFLEX) 500 MG capsule  2 times daily     12/09/17 2321    benzonatate (TESSALON) 100 MG capsule  Every 8 hours     12/09/17 2321       Rodney Booze, PA-C 12/10/17 0055    Pixie Casino, MD 12/12/17 [phone]

## 2017-12-09 NOTE — ED Triage Notes (Signed)
URI symptoms started wed, cough with clear sputum, weakness, nausea since. In general "don't feel good"

## 2017-12-12 LAB — URINE CULTURE

## 2017-12-13 ENCOUNTER — Telehealth: Payer: Self-pay | Admitting: Emergency Medicine

## 2017-12-13 NOTE — Telephone Encounter (Signed)
Post ED Visit - Positive Culture Follow-up  Culture report reviewed by antimicrobial stewardship pharmacist:  []  Elenor Quinones, Pharm.D. []  Heide Guile, Pharm.D., BCPS AQ-ID []  Parks Ne[place]une, Pharm.D., BCPS []  Alycia Rossetti, Pharm.D., BCPS []  [place], Pharm.D., BCPS, AAHIVP []  Legrand Como, Pharm.D., BCPS, AAHIVP []  Salome Arnt, PharmD, BCPS []  Jalene Mullet, PharmD []  Vincenza Hews, PharmD, BCPS Jimmy Footman PharmD  Positive urine culture Treated with cephalexin, organism sensitive to the same and no further patient follow-up is required at this time.  Hazle Nordmann 12/13/2017, 12:58 PM

## 2017-12-22 ENCOUNTER — Encounter: Payer: Self-pay | Admitting: Internal Medicine

## 2017-12-22 ENCOUNTER — Ambulatory Visit (INDEPENDENT_AMBULATORY_CARE_PROVIDER_SITE_OTHER): Payer: Medicare Other | Admitting: Internal Medicine

## 2017-12-22 VITALS — BP 155/80 | HR 100 | Temp 98.7°F | Wt 152.4 lb

## 2017-12-22 DIAGNOSIS — N899 Noninflammatory disorder of vagina, unspecified: Secondary | ICD-10-CM | POA: Diagnosis not present

## 2017-12-22 DIAGNOSIS — R110 Nausea: Secondary | ICD-10-CM

## 2017-12-22 DIAGNOSIS — N898 Other specified noninflammatory disorders of vagina: Secondary | ICD-10-CM

## 2017-12-22 DIAGNOSIS — K5900 Constipation, unspecified: Secondary | ICD-10-CM | POA: Diagnosis not present

## 2017-12-22 MED ORDER — PEG 3350-KCL-NA BICARB-NACL 420 G PO SOLR: 236.0000 mL | Freq: Every day | ORAL | 0 refills | Status: DC | PRN

## 2017-12-22 NOTE — Assessment & Plan Note (Addendum)
Nausea is likely due to constipation.  I did a Hemoccult which was negative for any blood -NOTED significant stool burden in her rectal vault.  No concern for acute abdomen, given completely nontender abdominal exam. No history of  swallowing issues.  Negative CT for abdominal problems noted on 4/20. Does have hx of Diabetes however well controlled - therefore unlikely gastroparesis. Given age will do a light clean out - polyethylene glycol-electrolytes (NULYTELY/GOLYTELY) 420 g solution; Take 236 mLs by mouth daily as needed. Please take 8 oz or 1 glass of medication a day.  Dispense: 4000 mL; Refill: 0 - Continue miralax daily  - Ambulatory referral to Gastroenterology for further work up - Follow up in 3-4 days if no improvement in nausea or BMs

## 2017-12-22 NOTE — Progress Notes (Signed)
   [facility] Kerrin Mo, MD Phone: [phone]  Reason For Visit: SDA for Nausea   # Nausea  Patient states she feels nauseated - has been going for about 5 years.. Patient denies any feeling of something being stuck her throat. She states she swallowing just fine. She denies any abdominal pain. Patient states she threw up one time in April when she was seen in the  ED for nausea. She had a work a work up that included a CT positive for constipation and vaginal mass. Patient states she has severe constipation She has small little round bowels when she goes to the bathroom. She states yesterday she noticed some spotting on her panties and wonder if she had bleeding from her anus of vagina she was not sure.   No urinary symptoms  No fevers   Patient does have hx of diabetes with A1C 7.3 - well controlled   # Vaginal Mass  Vaginal mass noted on CT done in the ED.  She was given a referral to OB/GYN however she lost her papers.  She does note some spotting as above x1 on her underpants.  Past Medical History Reviewed problem list.  Medications- reviewed and updated No additions to family history  Objective: BP (!) 155/80 (BP Location: Left Arm, Patient Position: Sitting, Cuff Size: Normal)   Pulse 100   Temp 98.7 F (37.1 C) (Oral)   Wt 152 lb 6.4 oz (69.1 kg)   SpO2 96%   BMI 25.36 kg/m  Gen: NAD, alert, cooperative with exam GI: soft, non-tender, non-distended, bowel sounds present, no hepatomegaly, no splenomegaly Rectal Exam: Hard stool noted in the rectal vault    Assessment/Plan: See problem based a/p  Nausea without vomiting Nausea is likely due to constipation.  I did a Hemoccult which was negative for any blood -NOTED significant stool burden in her rectal vault.  No concern for acute abdomen, given completely nontender abdominal exam. No history of  swallowing issues.  Negative CT for abdominal problems noted on 4/20. Does have hx of Diabetes  however well controlled - therefore unlikely gastroparesis. Given age will do a light clean out - polyethylene glycol-electrolytes (NULYTELY/GOLYTELY) 420 g solution; Take 236 mLs by mouth daily as needed. Please take 8 oz or 1 glass of medication a day.  Dispense: 4000 mL; Refill: 0 - Continue miralax daily  - Ambulatory referral to Gastroenterology for further work up - Follow up in 3-4 days if no improvement      Vaginal mass CT done on 4/20 with vaginal mass. Patient indicates have spotting x1 last night on underwear. hemoccult negative therefore unlikely from rectum  - Urgent referral to [place]

## 2017-12-22 NOTE — Assessment & Plan Note (Signed)
CT done on 4/20 with vaginal mass. Patient indicates have spotting x1 last night on underwear. hemoccult negative therefore unlikely from rectum  - Urgent referral to [facility]

## 2017-12-22 NOTE — Patient Instructions (Addendum)
I am going to refer you to gynecology they should follow with with you soon.  I want you to start taking GoLYTELY medication that I have sent in for you.  I want you to take 1 glass of this every day to help with your bowel movements or 8 ounces.  I believe that your nausea is from severe constipation.  I will also place consult to gastroenterology to follow-up with you. I

## 2017-12-23 LAB — CBC
Hematocrit: 35.2 % (ref 34.0–46.6)
Hemoglobin: 11.4 g/dL (ref 11.1–15.9)
MCH: 30.7 pg (ref 26.6–33.0)
MCHC: 32.4 g/dL (ref 31.5–35.7)
MCV: 95 fL (ref 79–97)
Platelets: 404 10*3/uL — ABNORMAL HIGH (ref 150–379)
RBC: 3.71 x10E6/uL — AB (ref 3.77–5.28)
RDW: 13.7 % (ref 12.3–15.4)
WBC: 4.5 10*3/uL (ref 3.4–10.8)

## 2017-12-25 ENCOUNTER — Encounter: Payer: Self-pay | Admitting: Internal Medicine

## 2018-01-10 ENCOUNTER — Encounter: Payer: Self-pay | Admitting: Obstetrics and Gynecology

## 2018-01-10 ENCOUNTER — Other Ambulatory Visit: Payer: Self-pay | Admitting: Obstetrics and Gynecology

## 2018-01-10 ENCOUNTER — Ambulatory Visit (INDEPENDENT_AMBULATORY_CARE_PROVIDER_SITE_OTHER): Payer: Medicare Other | Admitting: Obstetrics and Gynecology

## 2018-01-10 VITALS — BP 173/115 | HR 115 | Resp 18 | Ht 61.0 in | Wt 153.4 lb

## 2018-01-10 DIAGNOSIS — N899 Noninflammatory disorder of vagina, unspecified: Secondary | ICD-10-CM

## 2018-01-10 DIAGNOSIS — N898 Other specified noninflammatory disorders of vagina: Secondary | ICD-10-CM

## 2018-01-16 NOTE — Progress Notes (Signed)
Ms Stencil presents for evaluation of "vaginal mass" noted on CT scan in April. Pt was being seen in ER at that time for N/V and abd pain.  She is S/P TAH in the past she thinks  For fibroids and heavy cycles. She also had a "bladder suspension" a few yrs ago. She has CKD,DM and HTN which are managed by her PCP. She is not sexual active Last pap is unknown  PE Pleasant elderly female in NAD Lungs clear  Heart RRR ABd soft + BS non tender GU nl EGBUS vaginal mucosa atrophic cuff intact Bimanual fullness noted above the vaginal cuff extending into the abd, no adnexal masses ot tenderness noted  CT scan reviewed  A/P " Vaginal Mass"  Uncertain as to etiology of " vaginal mass". Feel additional evaluation by MRI warranted. Discussed with pt. MRI of pelvis will be scheduled. F/U or referral to general surgery following MRI results.

## 2018-01-19 ENCOUNTER — Ambulatory Visit (HOSPITAL_COMMUNITY)
Admission: RE | Admit: 2018-01-19 | Discharge: 2018-01-19 | Disposition: A | Discharge status: Home / Self Care | Payer: Medicare Other | Source: Ambulatory Visit | Attending: Obstetrics and Gynecology | Admitting: Obstetrics and Gynecology

## 2018-01-19 DIAGNOSIS — N898 Other specified noninflammatory disorders of vagina: Secondary | ICD-10-CM

## 2018-01-19 DIAGNOSIS — Z90710 Acquired absence of both cervix and uterus: Secondary | ICD-10-CM | POA: Diagnosis not present

## 2018-01-19 DIAGNOSIS — N899 Noninflammatory disorder of vagina, unspecified: Secondary | ICD-10-CM | POA: Diagnosis not present

## 2018-01-19 MED ORDER — GADOBENATE DIMEGLUMINE 529 MG/ML IV SOLN
15.0000 mL | Freq: Once | INTRAVENOUS | Status: AC | PRN
  Administered 2018-01-19: 15 mL via INTRAVENOUS

## 2018-01-23 ENCOUNTER — Telehealth: Payer: Self-pay

## 2018-01-23 DIAGNOSIS — N898 Other specified noninflammatory disorders of vagina: Secondary | ICD-10-CM

## 2018-01-23 DIAGNOSIS — R1907 Generalized intra-abdominal and pelvic swelling, mass and lump: Secondary | ICD-10-CM

## 2018-01-23 NOTE — Telephone Encounter (Signed)
Left message for patient to call office regarding her MRI results and recommendations.

## 2018-01-23 NOTE — Telephone Encounter (Signed)
Patient aware of MRI results and recommendations.  CT scheduled, patient aware.

## 2018-01-29 ENCOUNTER — Encounter: Payer: Self-pay | Admitting: Internal Medicine

## 2018-01-29 ENCOUNTER — Ambulatory Visit (INDEPENDENT_AMBULATORY_CARE_PROVIDER_SITE_OTHER): Payer: Medicare Other | Admitting: Internal Medicine

## 2018-01-29 VITALS — BP 160/80 | HR 111 | Temp 98.1°F | Wt 152.6 lb

## 2018-01-29 DIAGNOSIS — R319 Hematuria, unspecified: Secondary | ICD-10-CM | POA: Insufficient documentation

## 2018-01-29 DIAGNOSIS — R310 Gross hematuria: Secondary | ICD-10-CM | POA: Diagnosis not present

## 2018-01-29 LAB — POCT URINALYSIS DIP (MANUAL ENTRY)
BILIRUBIN UA: NEGATIVE
BILIRUBIN UA: NEGATIVE mg/dL
NITRITE UA: POSITIVE — AB
Protein Ur, POC: NEGATIVE mg/dL
RBC UA: NEGATIVE
Spec Grav, UA: 1.01 (ref 1.010–1.025)
Urobilinogen, UA: 1 E.U./dL
pH, UA: 5.5 (ref 5.0–8.0)

## 2018-01-29 MED ORDER — SULFAMETHOXAZOLE-TRIMETHOPRIM 800-160 MG PO TABS: 1.0000 | ORAL_TABLET | Freq: Two times a day (BID) | ORAL | 0 refills | Status: DC

## 2018-01-29 NOTE — Addendum Note (Signed)
Addended by: [place] Pink on: 01/29/2018 03:00 PM   Modules accepted: Orders

## 2018-01-29 NOTE — Patient Instructions (Addendum)
It was nice seeing you again today Hayley Fisher!  Please begin taking Bactrim (antibiotic) one tablet twice a day for the next 3 days. Be sure to take all 6 tablets even if your symptoms improve. If you continue to see blood in your urine even after you have completed the antibiotics, please let us know.   If you have any questions or concerns, please feel free to call the clinic.   Be well,  Dr. Avon Gully

## 2018-01-29 NOTE — Progress Notes (Signed)
   Subjective:   Patient: Hayley Fisher       Birthdate: 02/10/1931       MRN: [id number]      HPI  Hayley Fisher is a 84 y.o. female presenting for same day appt for hematuria.   Hematuria Noticed first last night, then twice today. Not with every urination. Toilet bowl is red, not only red stains on toilet paper. Endorses some discomfort and itching with urination, no burning. Endorses increased urinary frequency but has been drinking more water than usual recently. Denies abdomina pain, vomiting, fevers. Does endorse nausea. Endorses malodorous urine.  Smoking status reviewed. Patient is former smoker.   Review of Systems See HPI.     Objective:  Physical Exam  Constitutional: She is oriented to person, place, and time. She appears well-developed and well-nourished. No distress.  HENT:  Head: Normocephalic and atraumatic.  Mouth/Throat: Oropharynx is clear and moist.  Pulmonary/Chest: Effort normal. No respiratory distress.  Neurological: She is alert and oriented to person, place, and time.  Psychiatric: She has a normal mood and affect. Her behavior is normal.      Assessment & Plan:  Hematuria Most likely 2/2 UTI, as UA with nitrites and leuks. Patient did not provide enough urine to perform microscopy, however no blood noted on dipstick. Hematuria less likely due to dehydration, as no reason for extreme dehydration/rhabdo and MMM on exam. Less likely ureteral stone or kidney stone, as patient denying any pain. Will treat with Bactrim x3d. F/u if no improvement after completing abx course.   Adin Hector, MD, MPH PGY-3 [facility] Pager [phone]

## 2018-01-29 NOTE — Assessment & Plan Note (Signed)
Most likely 2/2 UTI, as UA with nitrites and leuks. Patient did not provide enough urine to perform microscopy, however no blood noted on dipstick. Hematuria less likely due to dehydration, as no reason for extreme dehydration/rhabdo and MMM on exam. Less likely ureteral stone or kidney stone, as patient denying any pain. Will treat with Bactrim x3d. F/u if no improvement after completing abx course.

## 2018-01-31 LAB — URINE CULTURE

## 2018-02-01 ENCOUNTER — Telehealth: Payer: Self-pay | Admitting: Internal Medicine

## 2018-02-01 MED ORDER — AMOXICILLIN-POT CLAVULANATE 500-125 MG PO TABS: 500.0000 mg | ORAL_TABLET | Freq: Two times a day (BID) | ORAL | 0 refills | Status: DC

## 2018-02-01 NOTE — Telephone Encounter (Signed)
Called patient regarding urine culture sensitivities. Patient placed on Bactrim, however E.coli from culture resistant to Bactrim, along with multiple other abx. Will start on course of Augmentin. Patient says she has continued to see blood in her urine even after beginning Bactrim. Will start Augmentin today. Patient voiced understanding and appreciation.   Adin Hector, MD, MPH PGY-3 [facility] Pager [phone]

## 2018-02-08 ENCOUNTER — Other Ambulatory Visit: Payer: Self-pay | Admitting: Internal Medicine

## 2018-02-08 DIAGNOSIS — E1140 Type 2 diabetes mellitus with diabetic neuropathy, unspecified: Secondary | ICD-10-CM

## 2018-02-08 DIAGNOSIS — Z794 Long term (current) use of insulin: Principal | ICD-10-CM

## 2018-03-06 ENCOUNTER — Other Ambulatory Visit: Payer: Self-pay | Admitting: Internal Medicine

## 2018-03-07 ENCOUNTER — Telehealth: Payer: Self-pay | Admitting: Family Medicine

## 2018-03-07 NOTE — Telephone Encounter (Signed)
Daughter in law dropped off FL2 form to be completed by PCP.  Patient has appt. Scheduled for 03/21/18. Placed form in envelope and put in Dr. Ericka Pontiff mail box .

## 2018-03-09 NOTE — Telephone Encounter (Signed)
Noted. I will complete after appointment as scheduled.  Harriet Butte, [place], PGY-3

## 2018-03-21 ENCOUNTER — Ambulatory Visit: Payer: Medicare Other | Admitting: Family Medicine

## 2018-03-22 DIAGNOSIS — R54 Age-related physical debility: Secondary | ICD-10-CM | POA: Insufficient documentation

## 2018-04-03 ENCOUNTER — Encounter: Payer: Self-pay | Admitting: Family Medicine

## 2018-04-03 ENCOUNTER — Ambulatory Visit (INDEPENDENT_AMBULATORY_CARE_PROVIDER_SITE_OTHER): Payer: Medicare Other | Admitting: Family Medicine

## 2018-04-03 VITALS — BP 130/80 | HR 90 | Temp 98.6°F | Wt 145.0 lb

## 2018-04-03 DIAGNOSIS — R296 Repeated falls: Secondary | ICD-10-CM | POA: Insufficient documentation

## 2018-04-03 DIAGNOSIS — R2681 Unsteadiness on feet: Secondary | ICD-10-CM | POA: Diagnosis not present

## 2018-04-03 DIAGNOSIS — M8580 Other specified disorders of bone density and structure, unspecified site: Secondary | ICD-10-CM | POA: Diagnosis not present

## 2018-04-03 DIAGNOSIS — E1142 Type 2 diabetes mellitus with diabetic polyneuropathy: Secondary | ICD-10-CM

## 2018-04-03 NOTE — Patient Instructions (Signed)
Thank you for coming in to see [place] today. Please see below to review our plan for today's visit.  1.  I will arrange for physical therapy to evaluate your ability to walk along with home health aides to assess the safety of your home along with the necessary services so that you can perform your ADLs.   2.  I will have the forms filled out later today and you can pick them up at any point later tomorrow or any day after.  Please call the clinic at [phone] if your symptoms worsen or you have any concerns. It was our pleasure to serve you.  Harriet Butte, [place], PGY-3

## 2018-04-03 NOTE — Assessment & Plan Note (Signed)
Chronic.  Not on vitamin D or calcium supplements.  Does take multivitamin. - Ambulatory referral to home health for home safety assessment and consideration for home health aide particularly for bathing - FL 2 form completed along with CAP/DA form

## 2018-04-03 NOTE — Assessment & Plan Note (Signed)
Chronic.  On insulin.  No medications for polyneuropathy. - Advised patient to consider initiating Cymbalta daily as prescribed

## 2018-04-03 NOTE — Assessment & Plan Note (Signed)
Mechanical in nature.  Likely related to advanced age and degenerative joint disease. - Continue use of ambulatory walker at all times - Ambulatory referral to physical therapy for gait assessment

## 2018-04-03 NOTE — Progress Notes (Signed)
Subjective   Patient ID: Hayley Fisher    DOB: Nov 10, 1930, 84 y.o. female   MRN: [id number]  CC: "FL2"  HPI: Hayley Fisher is a 84 y.o. female who presents to clinic today for the following:  Stable gait: Patient is ambulatory by walker alone.  She is accompanied by her daughter who is primary caregiver and reports 3 falls over the last 2 months are related to mechanical falls.  Daughter states patient tends to lean forward and often grab objects too quickly before getting good footing and falls forward unable to maintain balance.  Patient does well when she takes her time.  She has good adherence with use of walker.  Diabetes with polyneuropathy: Patient has long-standing diabetes well controlled with insulin.  Patient is able to take her own scheduled medications and currently takes 15 units of Lantus daily.  She does have a history of Cymbalta after being discontinued on gabapentin which patient did not take because she was not aware of the new medication.  Osteopenia: Patient has diagnosis of osteopenia and is frail on exam.  She does have history of multiple falls.  She has use of walker with ambulation but has not been formally evaluated by physical therapy for gait assessment.  Daughter is hopeful that she can receive this along with home health care especially with her recent falls.  Her primary ADL need is for bathing and daughter would like home to be evaluated for safety.  She has a FL to form but is hopeful to avoid assisted living along with patient.  ROS: see HPI for pertinent.  [place]: DM with neuropathy, glaucoma, DDD with h/o mechanical falls, HTN, HLD, venous insufficiency, osteopenia, sacral ulcer. Surgical history right hernia repair, left glaucoma surgery, bladder tacking, TAH-BSO. Family history colon cancer (mother), breast cancer (daughter) DM. Smoking status reviewed. Medications reviewed.  Objective   BP 130/80   Pulse 90   Temp 98.6 F (37 C)   Wt 145 lb (65.8  kg)   SpO2 96%   BMI 27.40 kg/m  Vitals and nursing note reviewed.  General: frail elderly female, well nourished, well developed, NAD with non-toxic appearance HEENT: normocephalic, atraumatic, moist mucous membranes Cardiovascular: regular rate and rhythm without murmurs, rubs, or gallops Lungs: clear to auscultation bilaterally with normal work of breathing Abdomen: soft, non-tender, non-distended, normoactive bowel sounds Skin: warm, dry, no rashes or lesions, cap refill < 2 seconds Extremities: warm and well perfused, normal tone, no edema, slow and unsteady gait unless holding onto walker, 3/5 motor strength in all 4 extremities  Assessment & Plan   Frequent falls Mechanical in nature.  Likely related to advanced age and degenerative joint disease. - Continue use of ambulatory walker at all times - Ambulatory referral to physical therapy for gait assessment  Diabetic neuropathy associated with type 2 diabetes mellitus Chronic.  On insulin.  No medications for polyneuropathy. - Advised patient to consider initiating Cymbalta daily as prescribed  Osteopenia Chronic.  Not on vitamin D or calcium supplements.  Does take multivitamin. - Ambulatory referral to home health for home safety assessment and consideration for home health aide particularly for bathing - FL 2 form completed along with CAP/DA form  Orders Placed This Encounter  Procedures  . Ambulatory referral to Home Health    Referral Priority:   Routine    Referral Type:   Home Health Care    Referral Reason:   Specialty Services Required    Requested Specialty:   [place]  Services    Number of Visits Requested:   1   No orders of the defined types were placed in this encounter.   Harriet Butte, [place], PGY-3 04/03/2018, 6:34 PM

## 2018-04-20 ENCOUNTER — Telehealth: Payer: Self-pay

## 2018-04-20 NOTE — Telephone Encounter (Signed)
Orders for personal care services completed and faxed by Dr. Yisroel Ramming, copy in scan box. Copies also left at front desk for daughter-Jacqueline- to pick up. Wallace Cullens, RN

## 2018-04-24 ENCOUNTER — Other Ambulatory Visit: Payer: Self-pay | Admitting: General Practice

## 2018-04-24 DIAGNOSIS — N898 Other specified noninflammatory disorders of vagina: Secondary | ICD-10-CM

## 2018-04-25 ENCOUNTER — Ambulatory Visit (HOSPITAL_COMMUNITY): Admission: RE | Admit: 2018-04-25 | Payer: Medicare Other | Source: Ambulatory Visit

## 2018-04-25 ENCOUNTER — Telehealth: Payer: Self-pay

## 2018-04-25 ENCOUNTER — Other Ambulatory Visit: Payer: Medicare Other

## 2018-04-25 DIAGNOSIS — Z01812 Encounter for preprocedural laboratory examination: Secondary | ICD-10-CM

## 2018-04-25 NOTE — Telephone Encounter (Signed)
Received a call from [facility], Radiology, that the pt has a CT scan appt today at 1000 but has not had a recent BUN/Creatinine level drawn.  Pt called and pt's daughter stated that the pt is currently sleeping however they wanted to change her appt anyway.  I explained to the daughter that she can come in today btwn 8am and 7pm with the office closing btwn 12-1 for lunch for the blood draw and if she comes before 5pm will be able to reschedule her appt and also take her to pick up her contrast.  Pt's daughter stated understanding and that they will be here today.

## 2018-04-26 ENCOUNTER — Other Ambulatory Visit: Payer: Medicare Other

## 2018-04-26 DIAGNOSIS — Z01812 Encounter for preprocedural laboratory examination: Secondary | ICD-10-CM

## 2018-04-27 LAB — CREATININE, SERUM
CREATININE: 0.73 mg/dL (ref 0.57–1.00)
GFR, EST AFRICAN AMERICAN: 87 mL/min/{1.73_m2} (ref >59)
GFR, EST NON AFRICAN AMERICAN: 76 mL/min/{1.73_m2} (ref >59)

## 2018-04-27 LAB — BUN: BUN: 10 mg/dL (ref 8–27)

## 2018-05-02 ENCOUNTER — Ambulatory Visit (HOSPITAL_COMMUNITY): Admission: RE | Admit: 2018-05-02 | Payer: Medicare Other | Source: Ambulatory Visit

## 2018-05-16 ENCOUNTER — Ambulatory Visit (HOSPITAL_COMMUNITY)
Admission: RE | Admit: 2018-05-16 | Discharge: 2018-05-16 | Disposition: A | Discharge status: Home / Self Care | Payer: Medicare Other | Source: Ambulatory Visit | Attending: Obstetrics and Gynecology | Admitting: Obstetrics and Gynecology

## 2018-05-16 DIAGNOSIS — N281 Cyst of kidney, acquired: Secondary | ICD-10-CM | POA: Diagnosis not present

## 2018-05-16 DIAGNOSIS — N898 Other specified noninflammatory disorders of vagina: Secondary | ICD-10-CM | POA: Diagnosis not present

## 2018-05-16 DIAGNOSIS — Z90710 Acquired absence of both cervix and uterus: Secondary | ICD-10-CM | POA: Insufficient documentation

## 2018-05-16 MED ORDER — IOPAMIDOL (ISOVUE-300) INJECTION 61%
100.0000 mL | Freq: Once | INTRAVENOUS | Status: AC | PRN
  Administered 2018-05-16: 80 mL via INTRAVENOUS

## 2018-05-17 ENCOUNTER — Telehealth: Payer: Self-pay

## 2018-05-17 NOTE — Telephone Encounter (Signed)
Beth, nurse with [facility], LVM on nurse line stating the patient has declined PT and OT services because she is going to be out of town. Beth needs new VO allowing OT and Pt to start 10/3.   [phone]

## 2018-05-17 NOTE — Telephone Encounter (Signed)
Done.  Hayley Butte, DO [place], PGY-3

## 2018-05-24 ENCOUNTER — Telehealth: Payer: Self-pay | Admitting: *Deleted

## 2018-05-24 NOTE — Telephone Encounter (Signed)
Sharyn Lull requesting OT orders as follows:  1 x week for 1 week, then 2 x week for 3 weeks, then 1 x week for 1 week.  You can leave a detailed message on VM  Marinell Igarashi, Salome Spotted, [place]

## 2018-05-25 NOTE — Telephone Encounter (Signed)
Done

## 2018-05-28 ENCOUNTER — Telehealth: Payer: Self-pay | Admitting: Family Medicine

## 2018-05-28 NOTE — Telephone Encounter (Signed)
Done.  Hayley Butte, DO [place], PGY-3

## 2018-05-28 NOTE — Telephone Encounter (Signed)
Delila Pereyra Physical Therapist from [facility] is calling to request continued verbal orders.   Physical therapy 1 x for one week.  2x's a week for six weeks for strength balance and fall prevention.   The best call back number for [place] is [phone].

## 2018-05-29 ENCOUNTER — Telehealth: Payer: Self-pay | Admitting: *Deleted

## 2018-05-29 NOTE — Telephone Encounter (Signed)
Called pt to inform her that her CT scan was normal.  Informed her that she has, what appears, to be chronic constipation and that she should follow up with her PCP as needed for that constipation.  No further GYN follow up needed.  Pt verbalized understanding.

## 2018-05-29 NOTE — Telephone Encounter (Signed)
-----   Message from Chancy Milroy, MD sent at 05/28/2018  2:08 PM EDT ----- Please let pt know that her CT scan was normal. No evidence of vaginal mass Pt does have what appears to be chronic constipation. No further evaluation needed from a GYN standpoint.  Follow up with PCP as need for constipation. Thanks Legrand Como

## 2018-06-04 ENCOUNTER — Other Ambulatory Visit: Payer: Self-pay | Admitting: Internal Medicine

## 2018-06-18 ENCOUNTER — Other Ambulatory Visit: Payer: Self-pay | Admitting: Internal Medicine

## 2018-06-18 DIAGNOSIS — E119 Type 2 diabetes mellitus without complications: Secondary | ICD-10-CM

## 2018-07-05 ENCOUNTER — Telehealth: Payer: Self-pay | Admitting: Family Medicine

## 2018-07-05 NOTE — Telephone Encounter (Signed)
Physical Therapist Shanon Brow  from [place] is calling and would like to have verbal orders extending pt's therapy.  He would like to extend balance mobility and strength training 2 x week for 2 weeks.  David's call back number is [phone].

## 2018-07-06 NOTE — Telephone Encounter (Signed)
Verbal orders placed.  Harriet Butte, [place], PGY-3

## 2018-07-09 ENCOUNTER — Encounter (HOSPITAL_COMMUNITY): Payer: Self-pay | Admitting: Emergency Medicine

## 2018-07-09 ENCOUNTER — Emergency Department (HOSPITAL_COMMUNITY)
Admission: EM | Admit: 2018-07-09 | Discharge: 2018-07-09 | Disposition: A | Discharge status: Home / Self Care | Payer: Medicare Other | Attending: Emergency Medicine | Admitting: Emergency Medicine

## 2018-07-09 ENCOUNTER — Other Ambulatory Visit: Payer: Self-pay

## 2018-07-09 ENCOUNTER — Emergency Department (HOSPITAL_COMMUNITY): Payer: Medicare Other

## 2018-07-09 DIAGNOSIS — I129 Hypertensive chronic kidney disease with stage 1 through stage 4 chronic kidney disease, or unspecified chronic kidney disease: Secondary | ICD-10-CM | POA: Diagnosis not present

## 2018-07-09 DIAGNOSIS — Z87891 Personal history of nicotine dependence: Secondary | ICD-10-CM | POA: Diagnosis not present

## 2018-07-09 DIAGNOSIS — N189 Chronic kidney disease, unspecified: Secondary | ICD-10-CM | POA: Insufficient documentation

## 2018-07-09 DIAGNOSIS — N390 Urinary tract infection, site not specified: Secondary | ICD-10-CM

## 2018-07-09 DIAGNOSIS — Z7982 Long term (current) use of aspirin: Secondary | ICD-10-CM | POA: Diagnosis not present

## 2018-07-09 DIAGNOSIS — Z79899 Other long term (current) drug therapy: Secondary | ICD-10-CM | POA: Insufficient documentation

## 2018-07-09 DIAGNOSIS — E1122 Type 2 diabetes mellitus with diabetic chronic kidney disease: Secondary | ICD-10-CM | POA: Insufficient documentation

## 2018-07-09 DIAGNOSIS — R55 Syncope and collapse: Secondary | ICD-10-CM | POA: Diagnosis present

## 2018-07-09 DIAGNOSIS — Z794 Long term (current) use of insulin: Secondary | ICD-10-CM | POA: Insufficient documentation

## 2018-07-09 LAB — URINALYSIS, ROUTINE W REFLEX MICROSCOPIC
BILIRUBIN URINE: NEGATIVE
Glucose, UA: 150 mg/dL — AB
Hgb urine dipstick: NEGATIVE
KETONES UR: NEGATIVE mg/dL
Nitrite: NEGATIVE
Protein, ur: NEGATIVE mg/dL
SPECIFIC GRAVITY, URINE: 1.006 (ref 1.005–1.030)
pH: 7 (ref 5.0–8.0)

## 2018-07-09 LAB — BASIC METABOLIC PANEL
ANION GAP: 6 (ref 5–15)
BUN: 12 mg/dL (ref 8–23)
CALCIUM: 8.8 mg/dL — AB (ref 8.9–10.3)
CO2: 29 mmol/L (ref 22–32)
CREATININE: 0.73 mg/dL (ref 0.44–1.00)
Chloride: 102 mmol/L (ref 98–111)
GFR calc non Af Amer: >60 mL/min (ref >60)
Glucose, Bld: 271 mg/dL — ABNORMAL HIGH (ref 70–99)
Potassium: 3.8 mmol/L (ref 3.5–5.1)
SODIUM: 137 mmol/L (ref 135–145)

## 2018-07-09 LAB — CBC
HCT: 36.7 % (ref 36.0–46.0)
Hemoglobin: 11.8 g/dL — ABNORMAL LOW (ref 12.0–15.0)
MCH: 30.6 pg (ref 26.0–34.0)
MCHC: 32.2 g/dL (ref 30.0–36.0)
MCV: 95.3 fL (ref 80.0–100.0)
NRBC: 0 % (ref 0.0–0.2)
PLATELETS: 194 10*3/uL (ref 150–400)
RBC: 3.85 MIL/uL — AB (ref 3.87–5.11)
RDW: 13 % (ref 11.5–15.5)
WBC: 3.7 10*3/uL — AB (ref 4.0–10.5)

## 2018-07-09 LAB — TROPONIN I: Troponin I: <0.03 ng/mL (ref <0.03)

## 2018-07-09 MED ORDER — CEPHALEXIN 250 MG PO CAPS: 250.0000 mg | ORAL_CAPSULE | Freq: Four times a day (QID) | ORAL | 0 refills | Status: DC

## 2018-07-09 MED ORDER — SODIUM CHLORIDE 0.9 % IV BOLUS
1000.0000 mL | Freq: Once | INTRAVENOUS | Status: AC
  Administered 2018-07-09: 1000 mL via INTRAVENOUS

## 2018-07-09 MED ORDER — SODIUM CHLORIDE 0.9 % IV SOLN
1.0000 g | Freq: Once | INTRAVENOUS | Status: AC
  Administered 2018-07-09: 1 g via INTRAVENOUS
  Filled 2018-07-09: qty 10

## 2018-07-09 NOTE — Telephone Encounter (Signed)
Called and informed Shanon Brow on verbal orders per Dr. Yisroel Ramming.  Ozella Almond, [place]

## 2018-07-09 NOTE — ED Provider Notes (Signed)
[facility] EMERGENCY DEPARTMENT Provider Note   CSN: [id number] Arrival date & time: 07/09/18  0757     History   Chief Complaint Chief Complaint  Patient presents with  . Loss of Consciousness    HPI Hayley Fisher is a 84 y.o. female.  She presents by EMS after having a possible syncopal event at home.  She said she was on the toilet having a bowel movement when she had this feeling that she was going to pass out.  She denies that she passed all the way out and says she was able to call her sister.  She said that feeling lasted about 10 minutes.  She was nauseous with the episode.  No headache no chest pain no shortness of breath no vomiting no diarrhea no urinary symptoms.  She says she been constipated recently had a CAT scan.  She has some new eyedrops for her glaucoma.  She does not think she is been drinking as much she should.  The history is provided by the patient.  Loss of Consciousness   This is a new problem. The current episode started less than 1 hour ago. The problem has been resolved. There was no loss of consciousness. The problem is associated with bowel movements. Associated symptoms include light-headedness and nausea. Pertinent negatives include abdominal pain, chest pain, fever, focal weakness, headaches, seizures and vomiting. She has tried nothing for the symptoms. The treatment provided significant relief. Her past medical history is significant for DM and HTN.    Past Medical History:  Diagnosis Date  . Arthritis   . Arthritis   . Cataract    bil cateracts removed  . Chronic kidney disease    "spot on one of my kidneys" per pt  . Diabetes mellitus   . Glaucoma   . Hyperlipidemia   . Hyperplastic colon polyp   . Hypertension   . Syncope     Patient Active Problem List   Diagnosis Date Noted  . Unstable gait 04/03/2018  . Frequent falls 04/03/2018  . Hematuria 01/29/2018  . Vaginal mass 12/22/2017  . Nausea without vomiting  03/16/2017  . External hemorrhoids 03/16/2017  . Sacral ulcer ([place]) 03/16/2017  . Abnormal sensation of buttocks 03/25/2016  . Spinal stenosis of lumbar region 01/13/2016  . Chronic venous insufficiency 11/09/2015  . Family hx of colon cancer 07/23/2015  . IDDM (insulin dependent diabetes mellitus) ([place]) 07/23/2015  . Piriformis syndrome of right side 01/23/2015  . DDD (degenerative disc disease), lumbosacral 11/24/2014  . Constipation 11/24/2014  . Dark stools 11/24/2014  . Acute lower UTI 07/03/2014  . Sciatic pain 01/01/2014  . Cough 01/31/2013  . Tenesmus (rectal) 12/21/2012  . Recurrent UTI 10/29/2012  . Groin pain 08/06/2012  . Vertigo-Dizzy, vague feeling 11/16/2011  . Heart palpitations 11/16/2011  . ONYCHOMYCOSIS, TOENAILS 04/03/2009  . Diabetic neuropathy associated with type 2 diabetes mellitus ([place]) 04/03/2009  . ALOPECIA 11/02/2007  . COLONIC POLYPS, HYPERPLASTIC 02/16/2007  . Type 2 diabetes mellitus with diabetic neuropathy ([place]) 10/19/2006  . Pure hypercholesterolemia 10/19/2006  . OBESITY, NOS 10/19/2006  . GLAUCOMA 10/19/2006  . HYPERTENSION, BENIGN SYSTEMIC 10/19/2006  . OSTEOARTHRITIS, LOWER LEG 10/19/2006  . Osteopenia 10/19/2006    Past Surgical History:  Procedure Laterality Date  . ABDOMINAL HYSTERECTOMY    . BLADDER SUSPENSION    . bladder tacking    . COLONOSCOPY    . EYE SURGERY    . INGUINAL HERNIA REPAIR Right  OB History   None      Home Medications    Prior to Admission medications   Medication Sig Start Date End Date Taking? Authorizing Provider  ACCU-CHEK AVIVA PLUS test strip USE TO TEST BLOOD SUGAR UP  TO 3 TIMES A DAY. 10/31/17   Verner Mould, MD  ACCU-CHEK SOFTCLIX LANCETS lancets USE TO TEST BLOOD SUGAR UP  TO 3 TIMES A DAY. 10/31/17   Verner Mould, MD  acetaminophen (TYLENOL) 500 MG tablet Take 1 tablet (500 mg total) by mouth every 8 (eight) hours as needed. Patient not taking: Reported on  04/03/2018 01/01/14   [place] Ege, Glennon Mac, MD  amoxicillin-clavulanate (AUGMENTIN) 500-125 MG tablet Take 1 tablet (500 mg total) by mouth 2 (two) times daily. 02/01/18   Verner Mould, MD  aspirin 81 MG chewable tablet Chew 81 mg by mouth every morning.     [provider]  atorvastatin (LIPITOR) 40 MG tablet TAKE 1 TABLET BY MOUTH EVERY DAY 03/07/18   [place] Bing, DO  BD INSULIN SYRINGE ULTRAFINE 31G X 15/64" 0.3 ML MISC USE TO INJECT daily WITH EACH insulin injection. 12/14/15   Karamalegos, Devonne Doughty, DO  Blood Glucose Monitoring Suppl (ACCU-CHEK AVIVA PLUS) w/Device KIT Use to test blood sugar up to 3 times a day. ICD-10 code: E11.40. 09/02/16   Verner Mould, MD  brimonidine (ALPHAGAN P) 0.1 % SOLN Place 1 drop into the left eye 2 (two) times daily.  09/20/11     DULoxetine (CYMBALTA) 60 MG capsule TAKE 1 CAPSULE BY MOUTH EVERY DAY Patient not taking: Reported on 04/03/2018 03/06/18   [place] Bing, DO  enalapril (VASOTEC) 20 MG tablet TAKE 1 TABLET BY MOUTH EVERY DAY 08/02/17   Verner Mould, MD  guaiFENesin (DIABETIC TUSSIN EX) 100 MG/5ML liquid Take 200 mg by mouth 3 (three) times daily as needed for cough.    [provider]  hydrocortisone 2.5 % cream Apply 1 application rectally 2 times daily 07/04/17   Verner Mould, MD  insulin glargine (LANTUS) 100 UNIT/ML injection Inject 15 units below the skin daily 02/09/18   Verner Mould, MD  Lancets [facility] ULTRASOFT) lancets Check blood sugar up to 3 times daily as instructed. 09/10/15   Karamalegos, Devonne Doughty, DO  Lancets Misc. (ACCU-CHEK SOFTCLIX LANCET DEV) KIT Use as directed. ICD-10 code: E11.40. 09/02/16   Verner Mould, MD  latanoprost (XALATAN) 0.005 % ophthalmic solution Place 1 drop into the left eye at bedtime.  09/14/12   [provider]  metFORMIN (GLUCOPHAGE) 500 MG tablet TAKE 1 TABLET BY MOUTH 2 TIMES DAILY WITH FOOD  06/04/18   [place] Bing, DO  Multiple Vitamins-Minerals (PRESERVISION AREDS PO) Take 1 tablet by mouth every morning.     [provider]  polyethylene glycol powder (GLYCOLAX/MIRALAX) powder Take 17 g by mouth 2 (two) times daily as needed. 11/24/14   Street, Sharon Mt, MD  polyethylene glycol-electrolytes (NULYTELY/GOLYTELY) 420 g solution Take 236 mLs by mouth daily as needed. Please take 8 oz or 1 glass of medication a day. 12/22/17   Mikell, Jeani Sow, MD  sulfamethoxazole-trimethoprim (BACTRIM DS,SEPTRA DS) 800-160 MG tablet Take 1 tablet by mouth 2 (two) times daily. 01/29/18   Verner Mould, MD  TRUEPLUS INSULIN SYRINGE 31G X 5/16" 0.3 ML MISC Use syringe with insulin daily 06/18/18   [place] Bing, DO  VYZULTA 0.024 % SOLN Place 1 drop into the right eye daily. 11/06/17  [provider]    Family History Family History  Problem Relation Age of Onset  . Colon cancer Mother   . Other Father        cerebral hemorrhage  . Diabetes Brother   . Diabetes Sister        x 2  . Bone cancer Daughter   . Breast cancer Daughter 40  . Esophageal cancer Neg Hx   . Rectal cancer Neg Hx   . Stomach cancer Neg Hx     Social History Social History   Tobacco Use  . Smoking status: Former Smoker    Types: Cigarettes    Last attempt to quit: 11/29/1980    Years since quitting: 37.6  . Smokeless tobacco: Never Used  Substance Use Topics  . Alcohol use: No    Alcohol/week: 0.0 standard drinks  . Drug use: No     Allergies   Levemir [insulin detemir]   Review of Systems Review of Systems  Constitutional: Negative for fever.  HENT: Negative for sore throat.   Eyes: Negative for pain.  Respiratory: Negative for shortness of breath.   Cardiovascular: Positive for syncope. Negative for chest pain.  Gastrointestinal: Positive for constipation and nausea. Negative for abdominal pain and vomiting.  Genitourinary: Negative for dysuria.    Musculoskeletal: Negative for neck pain.  Skin: Negative for rash.  Neurological: Positive for light-headedness. Negative for focal weakness, seizures and headaches.     Physical Exam Updated Vital Signs BP (!) 185/100 (BP Location: Right Arm)   Pulse 80   Temp 98 F (36.7 C) (Oral)   Resp 16   Ht '5\' 1"'  (1.549 m)   Wt 66.7 kg   SpO2 100%   BMI 27.78 kg/m   Physical Exam  Constitutional: She appears well-developed and well-nourished. No distress.  HENT:  Head: Normocephalic and atraumatic.  Eyes: Conjunctivae are normal.  Neck: Neck supple.  Cardiovascular: Normal rate and regular rhythm.  No murmur heard. Pulmonary/Chest: Effort normal and breath sounds normal. No respiratory distress.  Abdominal: Soft. There is no tenderness.  Musculoskeletal: She exhibits no edema, tenderness or deformity.  Neurological: She is alert. She has normal strength. No sensory deficit. GCS eye subscore is 4. GCS verbal subscore is 5. GCS motor subscore is 6.  Skin: Skin is warm and dry. Capillary refill takes less than 2 seconds.  She is very mild skin breakdown in her sacral area.  Psychiatric: She has a normal mood and affect.  Nursing note and vitals reviewed.    ED Treatments / Results  Labs (all labs ordered are listed, but only abnormal results are displayed) Labs Reviewed  BASIC METABOLIC PANEL - Abnormal; Notable for the following components:      Result Value   Glucose, Bld 271 (*)    Calcium 8.8 (*)    All other components within normal limits  CBC - Abnormal; Notable for the following components:   WBC 3.7 (*)    RBC 3.85 (*)    Hemoglobin 11.8 (*)    All other components within normal limits  URINALYSIS, ROUTINE W REFLEX MICROSCOPIC - Abnormal; Notable for the following components:   APPearance CLOUDY (*)    Glucose, UA 150 (*)    Leukocytes, UA LARGE (*)    Bacteria, UA FEW (*)    All other components within normal limits  TROPONIN I    EKG EKG  Interpretation  Date/Time:  Monday July 09 2018 08:04:07 EST Ventricular Rate:  81 PR Interval:  QRS Duration: 85 QT Interval:  413 QTC Calculation: 480 R Axis:   -18 Text Interpretation:  Sinus rhythm Probable left atrial enlargement Borderline left axis deviation similar to prior 5/14 Confirmed by Aletta Edouard [phone]) on 07/09/2018 8:15:12 AM   Radiology Dg Chest 2 View  Result Date: 07/09/2018 CLINICAL DATA:  Cough and syncope EXAM: CHEST - 2 VIEW COMPARISON:  12/09/2017 FINDINGS: Hypoventilation. Mild bibasilar atelectasis. Negative for heart failure or effusion. IMPRESSION: Hypoventilation with mild bibasilar atelectasis. Electronically Signed   By: Franchot Gallo M.D.   On: 07/09/2018 08:36    Procedures Procedures (including critical care time)  Medications Ordered in ED Medications  sodium chloride 0.9 % bolus 1,000 mL (has no administration in time range)     Initial Impression / Assessment and Plan / ED Course  I have reviewed the triage vital signs and the nursing notes.  Pertinent labs & imaging results that were available during my care of the patient were reviewed by me and considered in my medical decision making (see chart for details).  Clinical Course as of Jul 09 1821  [facility] Jul 09, 2018  1134 Patient uses a walker at baseline.  She ambulated with assistance to the bathroom.  Antibiotics have infused in for a possible UTI.  She is feeling better now so we will discharge.   [MB]    Clinical Course User Index [MB] Hayden Rasmussen, MD     Final Clinical Impressions(s) / ED Diagnoses   Final diagnoses:  Near syncope  Lower urinary tract infectious disease    ED Discharge Orders         Ordered    cephALEXin (KEFLEX) 250 MG capsule  4 times daily     07/09/18 1136           Hayden Rasmussen, MD 07/09/18 Vernelle [place]

## 2018-07-09 NOTE — Discharge Instructions (Signed)
You were evaluated in the emergency department for a fainting episode.  You had lab work EKG and urinalysis done, and you have a possible urine infection.  You were feeling better after some IV fluids.  We are sending you home with a prescription for antibiotics to finish out for 1 week.  Please keep well-hydrated.  Follow-up with your doctor this week for recheck.  Return if any concerns.

## 2018-07-09 NOTE — ED Triage Notes (Signed)
Pt arrives via EMS from home with syncopal episode at home while trying to have a bowel movement, began feeling dizzy, passed out of the toilet. Neg head trauma. Pt slumped over on fire arrival. Pt alert, oriented x4 for EMS. 12 lead NSR. 160/80, HR 78, cbg 236. Ambulates with walker.

## 2018-10-04 ENCOUNTER — Other Ambulatory Visit: Payer: Self-pay

## 2018-10-04 DIAGNOSIS — I10 Essential (primary) hypertension: Secondary | ICD-10-CM

## 2018-10-04 MED ORDER — ENALAPRIL MALEATE 20 MG PO TABS: 20.0000 mg | ORAL_TABLET | Freq: Every day | ORAL | 3 refills | Status: DC

## 2018-10-04 MED ORDER — ATORVASTATIN CALCIUM 40 MG PO TABS: 40.0000 mg | ORAL_TABLET | Freq: Every day | ORAL | 3 refills | Status: DC

## 2018-10-16 ENCOUNTER — Other Ambulatory Visit: Payer: Self-pay

## 2018-10-16 ENCOUNTER — Encounter: Payer: Self-pay | Admitting: Family Medicine

## 2018-10-16 ENCOUNTER — Ambulatory Visit (INDEPENDENT_AMBULATORY_CARE_PROVIDER_SITE_OTHER): Payer: Medicare Other | Admitting: Family Medicine

## 2018-10-16 VITALS — BP 142/80 | HR 90 | Temp 98.4°F | Ht 63.0 in | Wt 144.8 lb

## 2018-10-16 DIAGNOSIS — E119 Type 2 diabetes mellitus without complications: Secondary | ICD-10-CM

## 2018-10-16 DIAGNOSIS — Z23 Encounter for immunization: Secondary | ICD-10-CM

## 2018-10-16 DIAGNOSIS — E1140 Type 2 diabetes mellitus with diabetic neuropathy, unspecified: Secondary | ICD-10-CM

## 2018-10-16 DIAGNOSIS — R059 Cough, unspecified: Secondary | ICD-10-CM

## 2018-10-16 DIAGNOSIS — Z794 Long term (current) use of insulin: Secondary | ICD-10-CM | POA: Diagnosis not present

## 2018-10-16 DIAGNOSIS — IMO0001 Reserved for inherently not codable concepts without codable children: Secondary | ICD-10-CM

## 2018-10-16 DIAGNOSIS — R05 Cough: Secondary | ICD-10-CM

## 2018-10-16 DIAGNOSIS — H6123 Impacted cerumen, bilateral: Secondary | ICD-10-CM | POA: Diagnosis not present

## 2018-10-16 DIAGNOSIS — K644 Residual hemorrhoidal skin tags: Secondary | ICD-10-CM

## 2018-10-16 LAB — POCT GLYCOSYLATED HEMOGLOBIN (HGB A1C): HbA1c, POC (controlled diabetic range): 8.4 % — AB (ref 0.0–7.0)

## 2018-10-16 MED ORDER — ALBUTEROL SULFATE HFA 108 (90 BASE) MCG/ACT IN AERS: 2.0000 | INHALATION_SPRAY | Freq: Four times a day (QID) | RESPIRATORY_TRACT | 0 refills | Status: DC | PRN

## 2018-10-16 MED ORDER — HYDROCORTISONE 2.5 % EX CREA: TOPICAL_CREAM | CUTANEOUS | 1 refills | Status: DC

## 2018-10-16 MED ORDER — CARBAMIDE PEROXIDE 6.5 % OT SOLN: 5.0000 [drp] | Freq: Two times a day (BID) | OTIC | 0 refills | Status: DC

## 2018-10-16 NOTE — Patient Instructions (Signed)
Use the Albuterol inhaler 2 puffs every 6 hours as needed for cough.  You can try this before you go to bed as well.  Let me know if this is helping in about 4 weeks.   Use the Debrox 5 drops in each ear once in the AM and once in the PM.  Come back in about 4 weeks to recheck you ears and your coughing.    I have refilled the hydrocortisone cream for your bottom.    It was good to see you today!

## 2018-10-18 ENCOUNTER — Encounter: Payer: Self-pay | Admitting: Family Medicine

## 2018-10-18 DIAGNOSIS — H6120 Impacted cerumen, unspecified ear: Secondary | ICD-10-CM | POA: Insufficient documentation

## 2018-10-18 NOTE — Progress Notes (Signed)
Subjective:    Hayley Fisher is a 85 y.o. female who presents to [facility] today for cough and runny nose:  1.  URI symptoms: Present 1 to 2 weeks ago.  Now with persistent cough.  Cough is worse at nighttime or when she goes out in the cold.  Cough is dry and nonproductive.  She has no postnasal drip symptoms or sinus congestion currently.  She has had no history of reflux or indigestion.  She was a prior long-term smoker.  Her daughter is present today and both report they sometimes hear wheezing.  No fevers or chills.  She is eating and drinking well.  2.  Hearing issues: Patient and daughter both state that she is having trouble hearing.  Patient thinks it is worse in her right ear.  Unclear exactly how this is been but sounds like at least months.  May be some occasional tinnitus.  No falls.  No vertigo symptoms.  Predated her URI.  3.  Diabetes:  Currently on 15 units insulin and BID Metformin.  No adverse effects from medication.  No hypoglycemic events.  No paresthesia or peripheral nerve pain.  No polyuria/polydipsia.    Lab Results  Component Value Date   HGBA1C 8.4 (A) 10/16/2018     ROS as above per HPI.     The following portions of the patient's history were reviewed and updated as appropriate: allergies, current medications, past medical history, family and social history, and problem list. Patient is a nonsmoker.    PMH reviewed.  Past Medical History:  Diagnosis Date  . Arthritis   . Arthritis   . Cataract    bil cateracts removed  . Chronic kidney disease    "spot on one of my kidneys" per pt  . Diabetes mellitus   . Glaucoma   . Hyperlipidemia   . Hyperplastic colon polyp   . Hypertension   . Syncope    Past Surgical History:  Procedure Laterality Date  . ABDOMINAL HYSTERECTOMY    . BLADDER SUSPENSION    . bladder tacking    . COLONOSCOPY    . EYE SURGERY    . INGUINAL HERNIA REPAIR Right     Medications reviewed. Current Outpatient Medications   Medication Sig Dispense Refill  . ACCU-CHEK AVIVA PLUS test strip USE TO TEST BLOOD SUGAR UP  TO 3 TIMES A DAY. 300 each 3  . ACCU-CHEK SOFTCLIX LANCETS lancets USE TO TEST BLOOD SUGAR UP  TO 3 TIMES A DAY. 300 each 3  . acetaminophen (TYLENOL) 500 MG tablet Take 1 tablet (500 mg total) by mouth every 8 (eight) hours as needed. (Patient not taking: Reported on 04/03/2018) 30 tablet 0  . albuterol (PROVENTIL HFA;VENTOLIN HFA) 108 (90 Base) MCG/ACT inhaler Inhale 2 puffs into the lungs every 6 (six) hours as needed for wheezing or shortness of breath. 1 Inhaler 0  . aspirin 81 MG chewable tablet Chew 81 mg by mouth every morning.     Marland Kitchen atorvastatin (LIPITOR) 40 MG tablet Take 1 tablet (40 mg total) by mouth daily. 90 tablet 3  . BD INSULIN SYRINGE ULTRAFINE 31G X 15/64" 0.3 ML MISC USE TO INJECT daily WITH EACH insulin injection. 100 each 11  . Blood Glucose Monitoring Suppl (ACCU-CHEK AVIVA PLUS) w/Device KIT Use to test blood sugar up to 3 times a day. ICD-10 code: E11.40. 1 kit 0  . brimonidine (ALPHAGAN P) 0.1 % SOLN Place 1 drop into the left eye 2 (two) times daily.     Marland Kitchen  carbamide peroxide (DEBROX) 6.5 % OTIC solution Place 5 drops into both ears 2 (two) times daily. 15 mL 0  . DULoxetine (CYMBALTA) 60 MG capsule TAKE 1 CAPSULE BY MOUTH EVERY DAY (Patient not taking: Reported on 04/03/2018) 90 capsule 1  . enalapril (VASOTEC) 20 MG tablet Take 1 tablet (20 mg total) by mouth daily. 90 tablet 3  . hydrocortisone 2.5 % cream Apply 1 application rectally 2 times daily 28 g 1  . insulin glargine (LANTUS) 100 UNIT/ML injection Inject 15 units below the skin daily 10 mL 11  . Lancets (ONETOUCH ULTRASOFT) lancets Check blood sugar up to 3 times daily as instructed. 100 each 12  . Lancets Misc. (ACCU-CHEK SOFTCLIX LANCET DEV) KIT Use as directed. ICD-10 code: E11.40. 1 kit 0  . latanoprost (XALATAN) 0.005 % ophthalmic solution Place 1 drop into the left eye at bedtime.     . metFORMIN (GLUCOPHAGE) 500  MG tablet TAKE 1 TABLET BY MOUTH 2 TIMES DAILY WITH FOOD 180 tablet 3  . Multiple Vitamins-Minerals (PRESERVISION AREDS PO) Take 1 tablet by mouth every morning.     Karen Chafe INSULIN SYRINGE 31G X 5/16" 0.3 ML MISC Use syringe with insulin daily 100 each 3  . VYZULTA 0.024 % SOLN Place 1 drop into the right eye daily.  0   No current facility-administered medications for this visit.      Objective:   Physical Exam BP (!) 142/80   Pulse 90   Temp 98.4 F (36.9 C) (Oral)   Ht _0  (1.6 m)   Wt 144 lb 12.8 oz (65.7 kg)   SpO2 97%   BMI 25.65 kg/m  Gen:  Patient sitting on exam table, appears stated age in no acute distress Head: Normocephalic atraumatic Eyes: EOMI, PERRL, sclera and conjunctiva non-erythematous Ears:  With cerumen impaction BL.  RIght worse than Left.  Left is moist.  Right is very dry appearing.   Nose:  Nasal turbinates normal BL Mouth: Mucosa membranes moist. Tonsils +2, nonenlarged, non-erythematous. Neck: No cervical lymphadenopathy noted Heart:  RRR, no murmurs auscultated. Pulm:  Minimal wheezing at bases.

## 2018-10-18 NOTE — Assessment & Plan Note (Signed)
Main symptom currently. -Prior smoker. -She has been treated in the past with albuterol.  Short-term course of this again today. -She has no diagnosis of COPD or asthma.  However does have wheezing. -Possibly start of bronchitis reactive/secondary to recent URI.  Her URI symptoms are now resolved. -Follow-up for reevaluation if no improvement in the next 1 week.

## 2018-10-18 NOTE — Assessment & Plan Note (Signed)
Mention she would like a refill in passing of her hydrocortisone cream.  I sent this in for her.

## 2018-10-18 NOTE — Assessment & Plan Note (Signed)
A1c today is 8.4.  She is just barely above 8.  Would not want to risk hypoglycemia.  Defer any changes to PCP but I am okay with continuing her insulin regimen at current dose.

## 2018-10-18 NOTE — Assessment & Plan Note (Signed)
Bilateral. Treat with Debrox. Follow-up in about a month or so for reexamination possible ear irrigation here in clinic.  Especially on the right she needs some moisturizer drops before any attempted irrigation because of how dry cerumen is on that side. This is likely cause of her hearing issues. If she still has issues after this may need referral to audiologist.

## 2018-10-22 ENCOUNTER — Other Ambulatory Visit: Payer: Self-pay

## 2018-10-22 MED ORDER — DULOXETINE HCL 60 MG PO CPEP: ORAL_CAPSULE | ORAL | 0 refills | Status: DC

## 2018-10-23 ENCOUNTER — Telehealth: Payer: Self-pay

## 2018-10-23 NOTE — Telephone Encounter (Signed)
Called patient and left voice mail asking her to call office to schedule and appointment for paperwork to be renewed per Dr. Yisroel Ramming.  Ozella Almond, [place]

## 2018-10-29 DIAGNOSIS — H401112 Primary open-angle glaucoma, right eye, moderate stage: Secondary | ICD-10-CM | POA: Diagnosis not present

## 2018-11-22 ENCOUNTER — Other Ambulatory Visit: Payer: Self-pay

## 2018-11-22 DIAGNOSIS — Z794 Long term (current) use of insulin: Principal | ICD-10-CM

## 2018-11-22 DIAGNOSIS — E1140 Type 2 diabetes mellitus with diabetic neuropathy, unspecified: Secondary | ICD-10-CM

## 2018-11-23 MED ORDER — INSULIN GLARGINE 100 UNIT/ML ~~LOC~~ SOLN: SUBCUTANEOUS | 11 refills | Status: DC

## 2018-12-20 DIAGNOSIS — H401123 Primary open-angle glaucoma, left eye, severe stage: Secondary | ICD-10-CM | POA: Diagnosis not present

## 2018-12-20 DIAGNOSIS — H401112 Primary open-angle glaucoma, right eye, moderate stage: Secondary | ICD-10-CM | POA: Diagnosis not present

## 2019-02-04 ENCOUNTER — Ambulatory Visit: Payer: Medicare Other | Admitting: Family Medicine

## 2019-03-19 ENCOUNTER — Ambulatory Visit (INDEPENDENT_AMBULATORY_CARE_PROVIDER_SITE_OTHER): Payer: Medicare Other | Admitting: Family Medicine

## 2019-03-19 ENCOUNTER — Other Ambulatory Visit: Payer: Self-pay

## 2019-03-19 ENCOUNTER — Encounter: Payer: Self-pay | Admitting: Family Medicine

## 2019-03-19 VITALS — BP 130/70 | HR 86 | Temp 98.4°F | Wt 142.0 lb

## 2019-03-19 DIAGNOSIS — Z794 Long term (current) use of insulin: Secondary | ICD-10-CM | POA: Diagnosis not present

## 2019-03-19 DIAGNOSIS — E119 Type 2 diabetes mellitus without complications: Secondary | ICD-10-CM | POA: Diagnosis not present

## 2019-03-19 DIAGNOSIS — L98421 Non-pressure chronic ulcer of back limited to breakdown of skin: Secondary | ICD-10-CM | POA: Diagnosis not present

## 2019-03-19 DIAGNOSIS — E1140 Type 2 diabetes mellitus with diabetic neuropathy, unspecified: Secondary | ICD-10-CM | POA: Diagnosis not present

## 2019-03-19 DIAGNOSIS — IMO0001 Reserved for inherently not codable concepts without codable children: Secondary | ICD-10-CM

## 2019-03-19 DIAGNOSIS — I872 Venous insufficiency (chronic) (peripheral): Secondary | ICD-10-CM

## 2019-03-19 LAB — POCT GLYCOSYLATED HEMOGLOBIN (HGB A1C): HbA1c, POC (controlled diabetic range): 8.3 % — AB (ref 0.0–7.0)

## 2019-03-19 MED ORDER — METFORMIN HCL ER 500 MG PO TB24: 1000.0000 mg | ORAL_TABLET | Freq: Two times a day (BID) | ORAL | 3 refills | Status: DC

## 2019-03-19 MED ORDER — INSULIN GLARGINE 100 UNIT/ML SOLOSTAR PEN: 100.0000 [IU] | PEN_INJECTOR | SUBCUTANEOUS | 11 refills | Status: DC

## 2019-03-19 MED ORDER — MUPIROCIN 2 % EX OINT: 1.0000 "application " | TOPICAL_OINTMENT | CUTANEOUS | 0 refills | Status: DC | PRN

## 2019-03-19 NOTE — Assessment & Plan Note (Addendum)
A1C slightly improved from 8.4 to 8.3 today. Target goal for age is 7.5-8.0. Does endorse occasional symptoms of hypoglycemia with current regimen if she forgets to eat. Given her age, I do not think tight control is necessary however I do believe there is room for small adjustments in order to obtain better glycemic control without risking hypoglycemia. Discussed options with patient and she is amendable to change. - Increase Metformin from 500mg  BID to 1000mg  BID - Patient instructed she can take 1500mg  QD (1000mg  qAM and 500mg  qHS) x 1 week, then increase to 1000mg  BID in order to avoid GI upset - Decrease Lantus from 15U to 10U qAM - continue to monitor CBGs - DME order for new glucometer placed - Per patient request, Lantus pen ordered to help with administration as arthritis makes it difficult. If not covered, patient will continue current method of administration - RTC in 3 months for A1C check - Return precautions discussed at length

## 2019-03-19 NOTE — Assessment & Plan Note (Addendum)
Chronic. Symmetric bilateral pitting edema, worse with prolonged sitting and improves with elevation.  No concern for DVT. No prior echocardiogram in chart however prior CXR without signs of cardiomegaly. Also most recent labs unremarkable for CKD or liver injury. Patient denies adequate feet elevation. - Advised proper leg elevation whenever possible, low sodium diet, compression socks - continue to monitor at follow up visit - consider echocardiogram if worsens or develops other symptoms concerning for CHF

## 2019-03-19 NOTE — Assessment & Plan Note (Signed)
Chronic sacral lesion with notable hypopigmentation and small scabbed area. No signs of acute ulceration, skin breakdown, or infection.  - recommended continuing use of doughnut when sitting - Mupirocin ointment PRN  - Advised to discontinue hydrocortisone and Neosporin - continue to monitor. Discussed return precautions including worsening pain, redness, bleeding, or discharge. Patient understood and agreed to plan.

## 2019-03-19 NOTE — Patient Instructions (Signed)
Thank you for coming to see me today! It was so nice to meet you.   Your A1C today was 8.3. Your goal is 7.5 to 8.0. We have decided to increase your Metformin to 1000mg  twice a day. You can take two 500mg  tablets in the morning and one 500mg  tablet in the evening for one week to help with stomach side effects. After one week you can increase to two 500mg  tablets twice a day. I would also like you to decrease your lantus to 10 units every morning. Please call me if you have any side effects with these changes.  Please come see me in 3 months for your next A1C check.  Take care, Dr. Tarry Kos

## 2019-03-19 NOTE — Progress Notes (Signed)
Subjective:   Patient ID: Hayley Fisher    DOB: May 10, 1931, 85 y.o. female   MRN: [id number]  Hayley Fisher is a 85 y.o. female here for diabetes recheck and ulcer on bottom.  Diabetes: Last A1C 8.4. Currently on 15U Lantus and Metformin 500mg  BID. Average blood sugar is 150 in the mornings, occasionally has up to 200's if something sweet later in the evening the night before.  No adverse effects from medication. Occasionally has episodes of lower blood sugar where she begins to feel bad if she skips a meal. This is occasional and has not had any loss of consciousness. Resolves with food.  Has numbness/tingling in feet bilaterally, unchanged.  No polyuria/polydipsia.   Ulcer on Bottom: Sore in center of bottom x 2 years. She notes it is the size nickel. Denies any blood or discharge. Occasionally has a scab. She has treated in the past with OTC antibiotic ointment and hydrocortisone. Improved with antibiotic ointment, otherwise Neosporin and hydrocortisone did not help. She also uses a donut whenever she sits to keep off the pressure. Pain has not worsened any, it is just there and she wants it to be looked at.   Review of Systems:  Per HPI.   [place], medications and smoking status reviewed.  Objective:   BP 130/70   Pulse 86   Temp 98.4 F (36.9 C) (Oral)   Wt 142 lb (64.4 kg)   SpO2 97%   BMI 25.15 kg/m  Vitals and nursing note reviewed.  General: pleasant elderly lady, in no acute distress with non-toxic appearance, sitting comfortably in exam chair with daughter at side HEENT: normocephalic, atraumatic CV: regular rate and rhythm without murmurs, rubs, or gallops, 2+ pitting edema bilaterally Lungs: clear to auscultation bilaterally with normal work of breathing Abdomen: soft, non-tender, non-distended, normoactive bowel sounds Skin: warm, dry Extremities: warm and well perfused, see foot exam below, small 1.5 x 1.5 cm hypopigmented area with small area of overlying scab at  apex of gluteal cleft without erythema, increased warmth, or signs of infection, no bleeding or discharge appreciated (see media) MSK: ROM grossly intact, strength intact, gait normal Neuro: Alert and oriented, speech normal  Diabetic Foot Exam - Simple   Simple Foot Form Diabetic Foot exam was performed with the following findings: Yes 03/19/2019  2:19 PM  Visual Inspection No deformities, no ulcerations, no other skin breakdown bilaterally: Yes See comments: Yes Sensation Testing Intact to touch and monofilament testing bilaterally: Yes See comments: Yes Pulse Check Posterior Tibialis and Dorsalis pulse intact bilaterally: Yes Comments Onychomycosis along toes bilaterally.  Mildly decreased sensation along dorsal aspect of foot bilaterally primarily along areas of increased pitting edema.         Assessment & Plan:   Type 2 diabetes mellitus with diabetic neuropathy A1C slightly improved from 8.4 to 8.3 today. Target goal for age is 7.5-8.0. Does endorse occasional symptoms of hypoglycemia with current regimen if she forgets to eat. Given her age, I do not think tight control is necessary however I do believe there is room for small adjustments in order to obtain better glycemic control without risking hypoglycemia. Discussed options with patient and she is amendable to change. - Increase Metformin from 500mg  BID to 1000mg  BID - Patient instructed she can take 1500mg  QD (1000mg  qAM and 500mg  qHS) x 1 week, then increase to 1000mg  BID in order to avoid GI upset - Decrease Lantus from 15U to 10U qAM - continue to monitor CBGs - DME order  for new glucometer placed - Per patient request, Lantus pen ordered to help with administration as arthritis makes it difficult. If not covered, patient will continue current method of administration - RTC in 3 months for A1C check - Return precautions discussed at length  Skin ulcer of sacrum, limited to breakdown of skin (HCC) Chronic sacral  lesion with notable hypopigmentation and small scabbed area. No signs of acute ulceration, skin breakdown, or infection.  - recommended continuing use of doughnut when sitting - Mupirocin ointment PRN  - Advised to discontinue hydrocortisone and Neosporin - continue to monitor. Discussed return precautions including worsening pain, redness, bleeding, or discharge. Patient understood and agreed to plan.   Chronic venous insufficiency Chronic. Symmetric bilateral pitting edema, worse with prolonged sitting and improves with elevation.  No concern for DVT. No prior echocardiogram in chart however prior CXR without signs of cardiomegaly. Also most recent labs unremarkable for CKD or liver injury. Patient denies adequate feet elevation. - Advised proper leg elevation whenever possible, low sodium diet, compression socks - continue to monitor at follow up visit - consider echocardiogram if worsens or develops other symptoms concerning for CHF  Orders Placed This Encounter  Procedures  . For home use only DME Glucometer  . HgB A1c   Meds ordered this encounter  Medications  . mupirocin ointment (BACTROBAN) 2 %    Sig: Place 1 application into the nose as needed.    Dispense:  22 g    Refill:  0  . Insulin Glargine (LANTUS) 100 UNIT/ML Solostar Pen    Sig: Inject 100 Units into the skin every morning.    Dispense:  3 mL    Refill:  11  . metFORMIN (GLUCOPHAGE-XR) 500 MG 24 hr tablet    Sig: Take 2 tablets (1,000 mg total) by mouth 2 (two) times a day.    Dispense:  360 tablet    Refill:  Roann, DO PGY-2, [facility] 03/19/2019 10:56 PM

## 2019-04-15 ENCOUNTER — Other Ambulatory Visit: Payer: Self-pay

## 2019-04-15 NOTE — Patient Outreach (Signed)
[facility]) Care Management  04/15/2019  Hayley Fisher 15-May-1931 [id number]   Medication Adherence call to Mrs. Hayley Fisher HIPPA Compliant Voice message left with a call back number. Mrs. Hayley Fisher is showing past due on Enalepril 20 mg under Hayley Fisher.  [place] Management Direct Dial [phone]  Fax [phone] [email or web address]

## 2019-04-17 ENCOUNTER — Other Ambulatory Visit: Payer: Self-pay | Admitting: Family Medicine

## 2019-04-17 MED ORDER — INSULIN GLARGINE 100 UNIT/ML ~~LOC~~ SOLN: 10.0000 [IU] | Freq: Every day | SUBCUTANEOUS | 11 refills | Status: DC

## 2019-04-17 NOTE — Progress Notes (Signed)
Patient called clinic to request the Lantus vial rather than lantus pen. Order placed.

## 2019-04-24 ENCOUNTER — Other Ambulatory Visit: Payer: Self-pay

## 2019-04-24 NOTE — Patient Outreach (Signed)
[facility]) Care Management  04/24/2019  Lamica Ostrow Dec 21, 1930 [id number]   Medication Adherence call to Mrs. Winifred Olive HIPPA Compliant Voice message left with a call back number. Mrs. Shehata is showing past due on Enalepril 20 mg and Atorvastatin 40 mg under [place].   [place] Management Direct Dial [phone]  Fax [phone] [email or web address]

## 2019-05-16 ENCOUNTER — Other Ambulatory Visit: Payer: Self-pay

## 2019-05-16 DIAGNOSIS — E1140 Type 2 diabetes mellitus with diabetic neuropathy, unspecified: Secondary | ICD-10-CM

## 2019-05-16 NOTE — Telephone Encounter (Signed)
[facility] calling to have the test strips and lancet Rx sent to them. Ottis Stain, CMA

## 2019-05-17 MED ORDER — ACCU-CHEK AVIVA PLUS VI STRP: ORAL_STRIP | 3 refills | Status: DC

## 2019-05-17 MED ORDER — ACCU-CHEK SOFTCLIX LANCETS MISC: 3 refills | Status: DC

## 2019-05-17 NOTE — Telephone Encounter (Signed)
Pt left a voicemail requesting her lancets and strips. She doesn't remember what pharmacy she is using since it has been a long time since she needed some supplies. jw

## 2019-06-26 ENCOUNTER — Other Ambulatory Visit: Payer: Self-pay

## 2019-06-26 DIAGNOSIS — E1140 Type 2 diabetes mellitus with diabetic neuropathy, unspecified: Secondary | ICD-10-CM

## 2019-06-26 DIAGNOSIS — Z794 Long term (current) use of insulin: Secondary | ICD-10-CM

## 2019-06-26 MED ORDER — METFORMIN HCL ER 500 MG PO TB24: 1000.0000 mg | ORAL_TABLET | Freq: Two times a day (BID) | ORAL | 3 refills | Status: DC

## 2019-06-26 NOTE — Telephone Encounter (Signed)
Refill ordered, however please have patient schedule diabetes follow up at earliest convenience as I would like to recheck A1C and check in on medication regimen. Thank you.

## 2019-07-10 ENCOUNTER — Other Ambulatory Visit: Payer: Self-pay

## 2019-07-10 DIAGNOSIS — I10 Essential (primary) hypertension: Secondary | ICD-10-CM

## 2019-07-10 MED ORDER — ENALAPRIL MALEATE 20 MG PO TABS: 20.0000 mg | ORAL_TABLET | Freq: Every day | ORAL | 3 refills | Status: DC

## 2019-07-10 NOTE — Telephone Encounter (Signed)
Pt informed. Fareeha Evon, CMA  

## 2019-07-10 NOTE — Telephone Encounter (Signed)
Will refill, however please have patient schedule an appointment at earliest convenience for a diabetes and HTN follow up. Thank you!

## 2019-07-24 ENCOUNTER — Other Ambulatory Visit: Payer: Self-pay | Admitting: *Deleted

## 2019-07-24 MED ORDER — ATORVASTATIN CALCIUM 40 MG PO TABS: 40.0000 mg | ORAL_TABLET | Freq: Every day | ORAL | 3 refills | Status: DC

## 2019-08-05 ENCOUNTER — Ambulatory Visit (INDEPENDENT_AMBULATORY_CARE_PROVIDER_SITE_OTHER): Payer: Medicare Other | Admitting: Family Medicine

## 2019-08-05 ENCOUNTER — Other Ambulatory Visit: Payer: Self-pay

## 2019-08-05 ENCOUNTER — Encounter: Payer: Self-pay | Admitting: Family Medicine

## 2019-08-05 VITALS — BP 127/80 | HR 100 | Wt 143.4 lb

## 2019-08-05 DIAGNOSIS — Z794 Long term (current) use of insulin: Secondary | ICD-10-CM | POA: Diagnosis not present

## 2019-08-05 DIAGNOSIS — I872 Venous insufficiency (chronic) (peripheral): Secondary | ICD-10-CM

## 2019-08-05 DIAGNOSIS — E1140 Type 2 diabetes mellitus with diabetic neuropathy, unspecified: Secondary | ICD-10-CM

## 2019-08-05 DIAGNOSIS — Z23 Encounter for immunization: Secondary | ICD-10-CM

## 2019-08-05 DIAGNOSIS — L98421 Non-pressure chronic ulcer of back limited to breakdown of skin: Secondary | ICD-10-CM

## 2019-08-05 DIAGNOSIS — E7800 Pure hypercholesterolemia, unspecified: Secondary | ICD-10-CM

## 2019-08-05 DIAGNOSIS — I10 Essential (primary) hypertension: Secondary | ICD-10-CM | POA: Diagnosis not present

## 2019-08-05 LAB — POCT GLYCOSYLATED HEMOGLOBIN (HGB A1C): HbA1c, POC (controlled diabetic range): 6.8 % (ref 0.0–7.0)

## 2019-08-05 NOTE — Patient Instructions (Addendum)
Please decrease your Lantus to 5 units every night. Keep a log of your blood sugars. If you continue to get low blood sugars in the <80 then I would say stop the Lantus altogether. Lets plan to see each other in 3 months for a check up (March 2021).  I obtained some blood work today. I will call you with the results.   Take care and have a happy holiday!  Dr. Tarry Kos

## 2019-08-05 NOTE — Progress Notes (Addendum)
Subjective:   Patient ID: Hayley Fisher    DOB: 1930/10/24, 85 y.o. female   MRN: [id number]  Hayley Fisher is a 85 y.o. female with a history of chronic venous insufficiency, HTN, T2DM, diabetic neuropathy, DDD, osteopenia, chronic skin ulcer of sacrum, and HLD here for diabetes follow up.   T2DM: Last A1C 8.3. A1C today 6.8. Currently taking Metformin 1000mg  BID and Lantus 10U qAm. Notes two episodes of hypoglycemia of 60-70 this past week. She notes this has happened several other times before as well. Notes she isnt eating as much recently which is probably why. CBG's range 70-150's with occasional 200-300's near the holidays. Denies any polyuria, polyphagia, polydispia. Due for diabetic eye exam. Appointment with ophthalmologist on 12/16.   HTN: BP 127/80 today. Currently on Enalapril 20mg  QD. Endorses compliance. Denies any chest pain, SOB, headaches or vision changes.   Chronic Ulcer: Patient has had chronic sore at gluteal cleft x 3 years. Patient would like for me to check on it again today. Denies any excessive pain, bleeding, or discharge.   Health Maintenace: Due for diabetic eye exam and Flu vaccine  Review of Systems:  Per HPI.   [place], medications and smoking status reviewed.  Objective:   BP 127/80   Pulse 100   Wt 143 lb 6.4 oz (65 kg)   SpO2 98%   BMI 25.40 kg/m  Vitals and nursing note reviewed.  General: pleasant older lady, sitting comfortably in exam chair with walker in front of her, well nourished, well developed, in no acute distress with non-toxic appearance CV: regular rate and rhythm without murmurs, rubs, or gallops, 1+ pitting edema to mid shin bilaterally Lungs: clear to auscultation bilaterally with normal work of breathing , breathing comfortably on room air Abdomen: soft, non-tender, non-distended,normoactive bowel sounds Skin: warm, dry, no rashes or lesions. Hypopigmented spot at gluteal cleft that is clearly a well healed prior ulcer, no  signs of skin breakdown, erythema, edema, or warmth.  Extremities: warm and well perfused, normal tone  Assessment & Plan:   HYPERTENSION, BENIGN SYSTEMIC BP well controlled on Enalapril. CMP obtained with good kidney function. - continue current medicines  Chronic venous insufficiency Stable. Continue to recommend low salt diet, LE elevation, and compression stockings. Discussed with patient. She understood and agreed to plan.  Type 2 diabetes mellitus with diabetic neuropathy (HCC) A1C significantly improved. Though could represent those several times of hypoglycemia. Will decrease Lantus. Patient instructed to monitor blood sugars. If fasting BS persistently <90 then she should stop Lantus altogether. She understood and agreed.  - Decrease Lantus to 5U qAm - Continue Metformin 1000mg  BID - RTC in 3 months for repeat A1C, sooner if any concerns - CMP obtained with stable kidney function   Skin ulcer of sacrum, limited to breakdown of skin (HCC) Chronic sacral lesion. No sign of active ulcer, skin break down, or infection. - continue doughnut when sitting - Mupirocin ointment PRN - Continue to monitor. Discussed strict return precautions including worsening pain, development of bleeding/discharge, redness or warmth. Patient understood and agreed to plan.  Pure hypercholesterolemia Lipid panel obtained and very well controlled with Lipitor. - continue Lipitor 40mg  QD   Health Maintenance: - Diabetic eye exam scheduled for 08/07/19 - Up to date on PNA vaccine  Orders Placed This Encounter  Procedures  . Flu Vaccine QUAD High Dose(Fluad)  . Comprehensive metabolic panel  . Lipid Panel  . HgB A1c    Mina Marble, DO PGY-2, [facility] 08/06/2019 8:21 PM

## 2019-08-06 ENCOUNTER — Encounter: Payer: Self-pay | Admitting: Family Medicine

## 2019-08-06 ENCOUNTER — Encounter (HOSPITAL_COMMUNITY): Payer: Self-pay | Admitting: Family Medicine

## 2019-08-06 LAB — LIPID PANEL
Chol/HDL Ratio: 1.9 ratio (ref 0.0–4.4)
Cholesterol, Total: 132 mg/dL (ref 100–199)
HDL: 68 mg/dL (ref >39)
LDL Chol Calc (NIH): 55 mg/dL (ref 0–99)
Triglycerides: 32 mg/dL (ref 0–149)
VLDL Cholesterol Cal: 9 mg/dL (ref 5–40)

## 2019-08-06 LAB — COMPREHENSIVE METABOLIC PANEL
ALT: 20 IU/L (ref 0–32)
AST: 28 IU/L (ref 0–40)
Albumin/Globulin Ratio: 2.3 — ABNORMAL HIGH (ref 1.2–2.2)
Albumin: 4.2 g/dL (ref 3.6–4.6)
Alkaline Phosphatase: 54 IU/L (ref 39–117)
BUN/Creatinine Ratio: 23 (ref 12–28)
BUN: 15 mg/dL (ref 8–27)
Bilirubin Total: 0.6 mg/dL (ref 0.0–1.2)
CO2: 26 mmol/L (ref 20–29)
Calcium: 9.3 mg/dL (ref 8.7–10.3)
Chloride: 101 mmol/L (ref 96–106)
Creatinine, Ser: 0.65 mg/dL (ref 0.57–1.00)
GFR calc Af Amer: 93 mL/min/{1.73_m2} (ref >59)
GFR calc non Af Amer: 81 mL/min/{1.73_m2} (ref >59)
Globulin, Total: 1.8 g/dL (ref 1.5–4.5)
Glucose: 119 mg/dL — ABNORMAL HIGH (ref 65–99)
Potassium: 4.5 mmol/L (ref 3.5–5.2)
Sodium: 140 mmol/L (ref 134–144)
Total Protein: 6 g/dL (ref 6.0–8.5)

## 2019-08-06 NOTE — Assessment & Plan Note (Signed)
Stable. Continue to recommend low salt diet, LE elevation, and compression stockings. Discussed with patient. She understood and agreed to plan.

## 2019-08-06 NOTE — Assessment & Plan Note (Signed)
Chronic sacral lesion. No sign of active ulcer, skin break down, or infection. - continue doughnut when sitting - Mupirocin ointment PRN - Continue to monitor. Discussed strict return precautions including worsening pain, development of bleeding/discharge, redness or warmth. Patient understood and agreed to plan.

## 2019-08-06 NOTE — Assessment & Plan Note (Signed)
BP well controlled on Enalapril. CMP obtained with good kidney function. - continue current medicines

## 2019-08-06 NOTE — Assessment & Plan Note (Signed)
A1C significantly improved. Though could represent those several times of hypoglycemia. Will decrease Lantus. Patient instructed to monitor blood sugars. If fasting BS persistently <90 then she should stop Lantus altogether. She understood and agreed.  - Decrease Lantus to 5U qAm - Continue Metformin 1000mg  BID - RTC in 3 months for repeat A1C, sooner if any concerns - CMP obtained with stable kidney function

## 2019-08-06 NOTE — Assessment & Plan Note (Signed)
Lipid panel obtained and very well controlled with Lipitor. - continue Lipitor 40mg  QD

## 2019-10-04 ENCOUNTER — Other Ambulatory Visit: Payer: Self-pay

## 2019-10-04 DIAGNOSIS — E119 Type 2 diabetes mellitus without complications: Secondary | ICD-10-CM

## 2019-10-04 MED ORDER — "INSULIN SYRINGE-NEEDLE U-100 31G X 5/16"" 0.3 ML MISC": 3 refills | Status: DC

## 2019-10-24 DIAGNOSIS — E113311 Type 2 diabetes mellitus with moderate nonproliferative diabetic retinopathy with macular edema, right eye: Secondary | ICD-10-CM | POA: Diagnosis not present

## 2019-10-31 ENCOUNTER — Encounter: Payer: Self-pay | Admitting: Family Medicine

## 2019-10-31 ENCOUNTER — Other Ambulatory Visit: Payer: Self-pay

## 2019-10-31 ENCOUNTER — Ambulatory Visit (INDEPENDENT_AMBULATORY_CARE_PROVIDER_SITE_OTHER): Payer: Medicare Other | Admitting: Family Medicine

## 2019-10-31 VITALS — BP 150/64 | HR 106 | Ht 63.0 in | Wt 145.2 lb

## 2019-10-31 DIAGNOSIS — E1140 Type 2 diabetes mellitus with diabetic neuropathy, unspecified: Secondary | ICD-10-CM

## 2019-10-31 DIAGNOSIS — I10 Essential (primary) hypertension: Secondary | ICD-10-CM

## 2019-10-31 DIAGNOSIS — Z794 Long term (current) use of insulin: Secondary | ICD-10-CM

## 2019-10-31 DIAGNOSIS — R3989 Other symptoms and signs involving the genitourinary system: Secondary | ICD-10-CM | POA: Insufficient documentation

## 2019-10-31 LAB — POCT WET PREP (WET MOUNT)
Clue Cells Wet Prep Whiff POC: NEGATIVE
Trichomonas Wet Prep HPF POC: ABSENT
WBC, Wet Prep HPF POC: >20

## 2019-10-31 LAB — POCT URINALYSIS DIP (MANUAL ENTRY)
Bilirubin, UA: NEGATIVE
Glucose, UA: NEGATIVE mg/dL
Nitrite, UA: NEGATIVE
Protein Ur, POC: 100 mg/dL — AB
Spec Grav, UA: 1.02 (ref 1.010–1.025)
Urobilinogen, UA: 0.2 E.U./dL
pH, UA: 6 (ref 5.0–8.0)

## 2019-10-31 LAB — POCT GLYCOSYLATED HEMOGLOBIN (HGB A1C): HbA1c, POC (controlled diabetic range): 7 % (ref 0.0–7.0)

## 2019-10-31 NOTE — Patient Instructions (Addendum)
Thank you for coming to see me today. It was a pleasure to see you.   Your diabetes is doing great. STOP the Lantus all together. Continue the Metformin. Follow up in 3 months.  Your blood pressure was elevated today. We will want to monitor this. Follow up in 3 months, sooner if problems.  I obtained some labs to evaluate your abnormal discharge.  I will call you with the results.  Please follow-up in 1 month if this continues. You may try Replens vaginal moisturizer if this is uncomfortable for you. We could also consider vaginal estrogen cream if this still is bothersome to you.  Please follow-up with me in 3 months for diabetes follow up.  If you have any questions or concerns, please do not hesitate to call the office at [phone].  Take Care,   Dr. Mina Marble, DO Resident Physician Hayley Fisher [phone]

## 2019-10-31 NOTE — Progress Notes (Signed)
Subjective:   Patient ID: Hayley Fisher    DOB: 03/05/1931, 86 y.o. female   MRN: [id number]  Donta Schwarm is a 86 y.o. female with a history of hypertension, type 2 diabetes, degenerative disc disease, osteopenia, chronic skin ulcer at sacrum, pure hypercholesterolemia here for diabetes follow-up  Diabetes: Last three A1C's below. Currently on Lantus 5U qAM, Metformin 1000mg  BID. Her lantus was decreased last visit due to some episodes of hypoglycemia. Endorses compliance. Notes CBGs range 120's, but had several in the 60-70's with occasional lightheadedness/dizziness. Denies any polyuria, polydipsia, polyphagia. Due for diabetic eye exam. She notes she saw her eye doctor 10/24/19  Lab Results  Component Value Date   HGBA1C 7.0 10/31/2019   HGBA1C 6.8 08/05/2019   HGBA1C 8.3 (A) 03/19/2019    HTN:  BP: (!) 150/113 today, Repeat 150/64. Currently on Enalapril 20mg  QD. Endorses compliance. Denies any chest pain, SOB, vision changes, or headaches.   Abnormal Urogenital Discharge: Patient endorsed "pink discharge" x 2 weeks. Denies any odor. She notes it occurs when she wipes. Denies any pelvic pain. Denies being sexually active. Denies any dysuria. She notes increased urination and frequency x 1 month. Denies any hematuria. Denies any pain with bowel movements. She notes her bowel movements have been on the softer side. History of abdominal hysterectomy.   Review of Systems:  Per HPI.   [place], medications and smoking status reviewed.  Objective:   BP (!) 150/64   Pulse (!) 106   Ht 5\' 3"  (1.6 m)   Wt 145 lb 4 oz (65.9 kg)   SpO2 96%   BMI 25.73 kg/m  Vitals and nursing note reviewed.  General: pleasant elderly female, well nourished, well developed, in no acute distress with non-toxic appearance CV: regular rate and rhythm without murmurs, rubs, or gallops Lungs: clear to auscultation bilaterally with normal work of breathing Skin: warm, dry Extremities: warm and well  perfused Neuro: Alert and oriented, speech normal Pelvic exam: VULVA: normal appearing vulva with no masses, tenderness or lesions, VAGINA: severely atrophic, small areas of bleeding along vaginal wall from speculum, signs of  Exam chaperoned by Cherrie Distance L.  Assessment & Plan:   HYPERTENSION, BENIGN SYSTEMIC Chronic, uncontrolled. BP elevated today. Denies any symptoms. Will continue to monitor. Continue Enalapril 20mg  QD. Follow up in 3 months.   Type 2 diabetes mellitus with diabetic neuropathy (HCC) A1C stable at 7.0 today. Continues to have few episodes of low blood sugars with hypoglycemic symptoms.  - Will stop Lantus - Continue Metformin 1000mg  BID - Diabetic eye exam completed last week. Will request results. - Follow up in 3 months for diabetes follow up.   Abnormal urogenital discharge Patient reports 2 weeks of pink vaginal discharge. Denies any pain, itching, or discomfort. Patient is s/p menopause and abdominal hysterectomy. Pelvic exam notable for atrophic vaginitis and easy bleeding with manipulation. Nontender to palpation. UA notable for red color, large leuks, negative nitrites, 100 protein and large RBC's. Urine culture pending. Wet prep negative.  Expect secondary to atrophic vaginitis, however unclear if also coming from bladder. Will follow up remaining urine studies. Would like to obtain follow up UA in about 1 month to evaluate for resolution of abnormal UA, sooner if worsening.   - Patient may try OTC  Replens vaginal moisturizer if becomes uncomfortable. Can also consider vaginal estrogen cream in the future  - repeat UA in 1 month to re-evaluate, future lab order placed.   Orders Placed This Encounter  Procedures  .  Urine Culture  . POCT glycosylated hemoglobin (Hb A1C)  . POCT urinalysis dipstick  . POCT Wet Prep [employer])  . POCT urinalysis dipstick    Standing Status:   Future    Standing Expiration Date:   02/03/2020    Mina Marble, DO PGY-2, [facility] 11/03/2019 12:48 PM

## 2019-10-31 NOTE — Assessment & Plan Note (Signed)
A1C stable at 7.0 today. Continues to have few episodes of low blood sugars with hypoglycemic symptoms.  - Will stop Lantus - Continue Metformin 1000mg  BID - Diabetic eye exam completed last week. Will request results. - Follow up in 3 months for diabetes follow up.

## 2019-10-31 NOTE — Assessment & Plan Note (Addendum)
Patient reports 2 weeks of pink vaginal discharge. Denies any pain, itching, or discomfort. Patient is s/p menopause and abdominal hysterectomy. Pelvic exam notable for atrophic vaginitis and easy bleeding with manipulation. Nontender to palpation. UA notable for red color, large leuks, negative nitrites, 100 protein and large RBC's. Urine culture pending. Wet prep negative.  Expect secondary to atrophic vaginitis, however unclear if also coming from bladder. Will follow up remaining urine studies. Would like to obtain follow up UA in about 1 month to evaluate for resolution of abnormal UA, sooner if worsening.   - Patient may try OTC  Replens vaginal moisturizer if becomes uncomfortable. Can also consider vaginal estrogen cream in the future  - repeat UA in 1 month to re-evaluate, future lab order placed.

## 2019-10-31 NOTE — Assessment & Plan Note (Addendum)
Chronic, uncontrolled. BP elevated today. Denies any symptoms. Will continue to monitor. Continue Enalapril 20mg  QD. Follow up in 3 months.

## 2019-11-17 IMAGING — CT CT ABD-PELV W/ CM
1 of 4 series · 14 of 32 positions shown, 19 images · IV contrast (OMNIPAQUE)
Comparison: MRI on 01/19/2018, and CTs on 12/09/2017 and 06/21/2007

CLINICAL DATA: Follow-up vaginal lesion.  Previous hysterectomy.

EXAM:
CT ABDOMEN AND PELVIS WITH CONTRAST
TECHNIQUE: Multidetector CT imaging of the abdomen and pelvis was performed
using the standard protocol following bolus administration of
intravenous contrast.
CONTRAST:  80mL PFXXW5-WOO IOPAMIDOL (PFXXW5-WOO) INJECTION 61%

[Series 2: routine abdomen/pelvis with · axial · 0.68mm/px · z∈[-436,-36]mm · 14 of 90 slices shown, 19 images]
[im 5/90  soft-tissue]
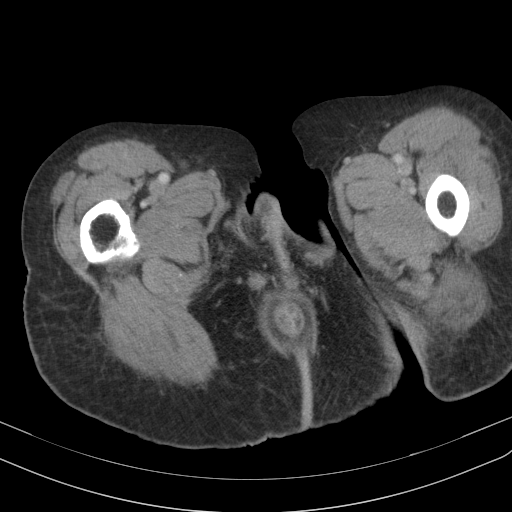
[im 5/90  bone]
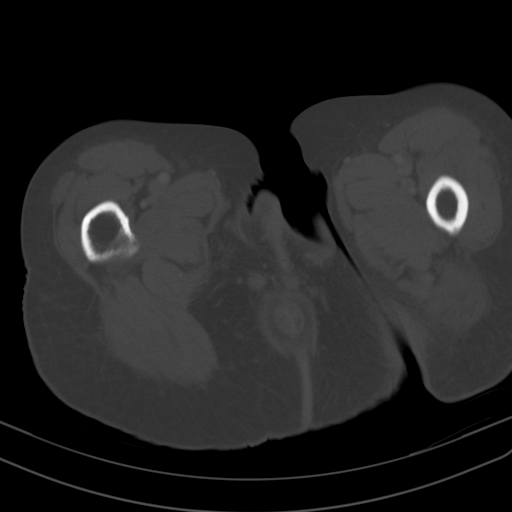
[im 10/90  soft-tissue]
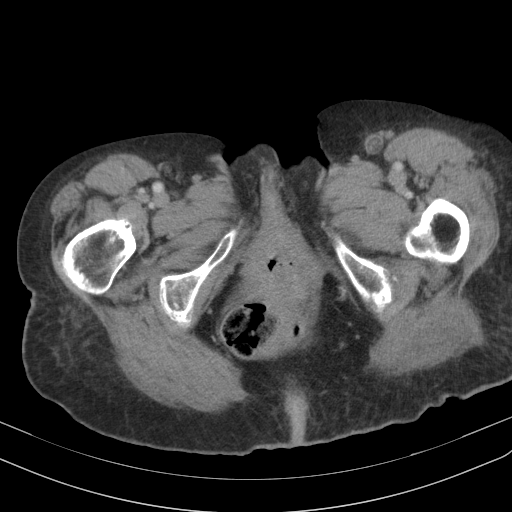
[im 20/90  soft-tissue]
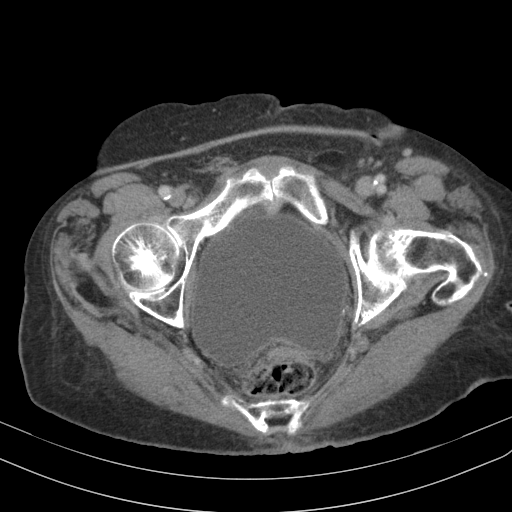
[im 25/90  soft-tissue]
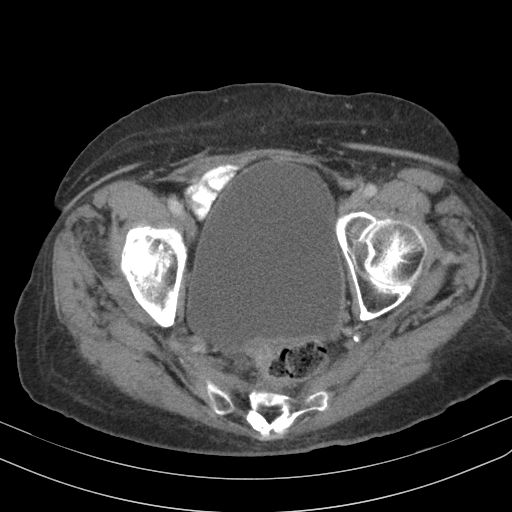
[im 30/90  soft-tissue]
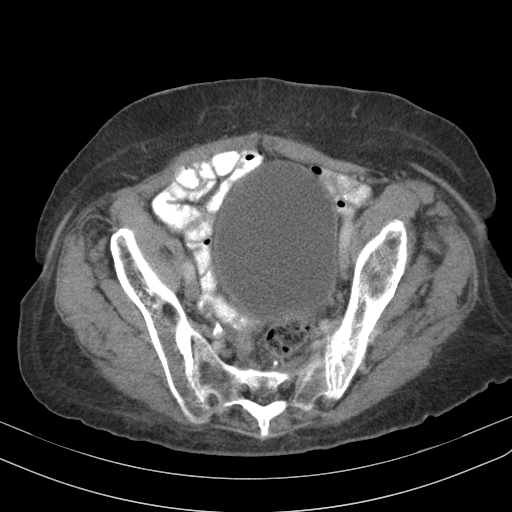
[im 40/90  soft-tissue]
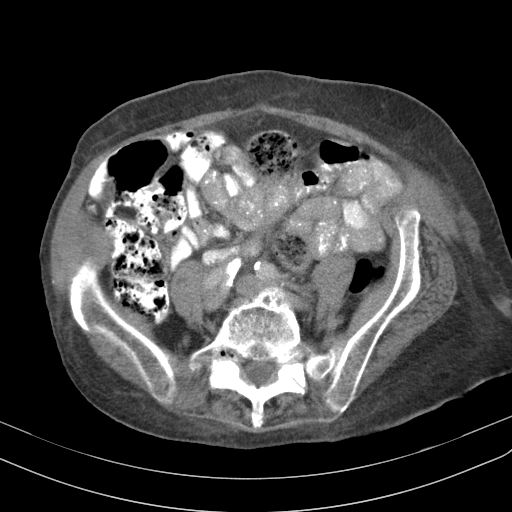
[im 45/90  soft-tissue]
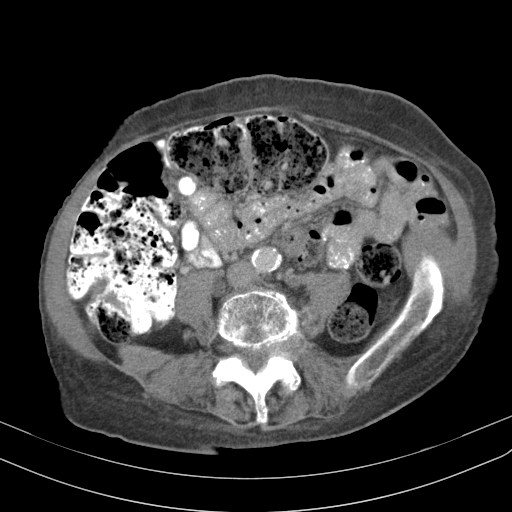
[im 50/90  soft-tissue]
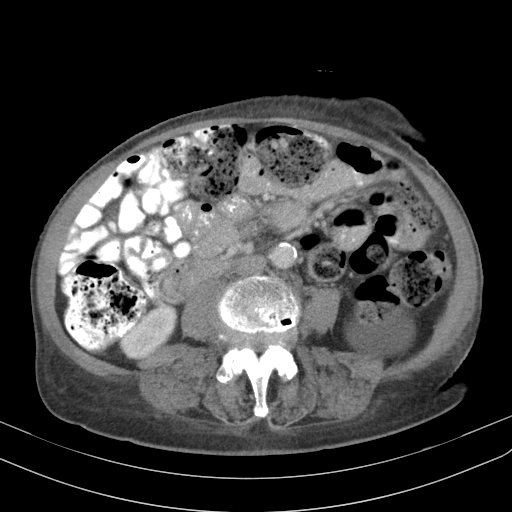
[im 60/90  soft-tissue]
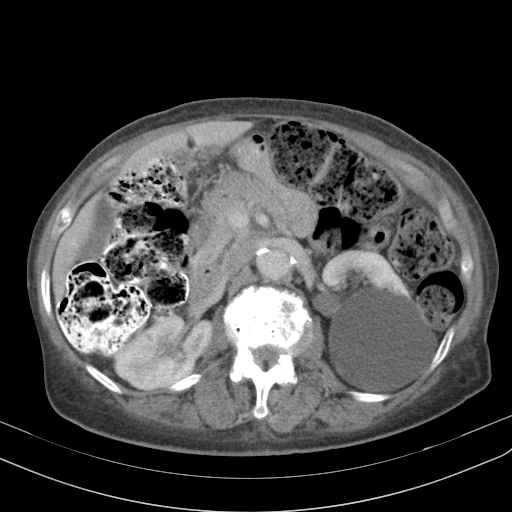
[im 60/90  bone]
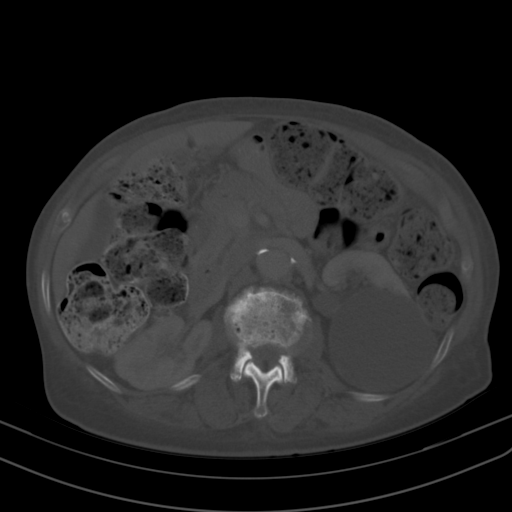
[im 65/90  soft-tissue]
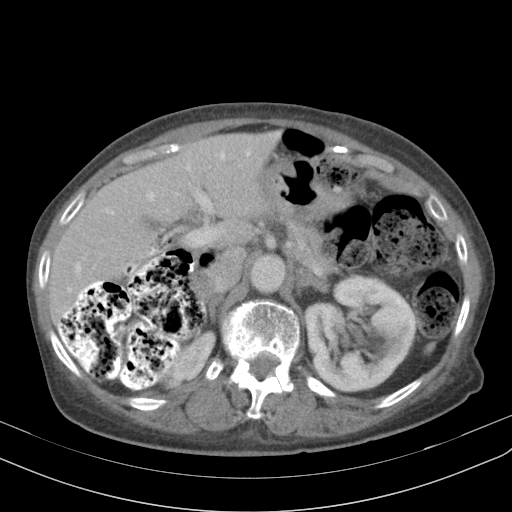
[im 70/90  soft-tissue]
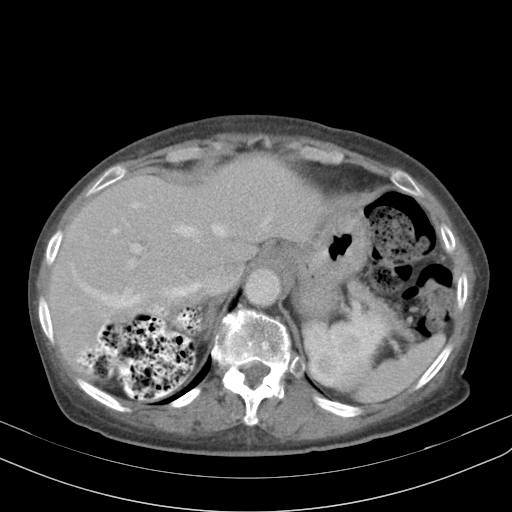
[im 70/90  lung]
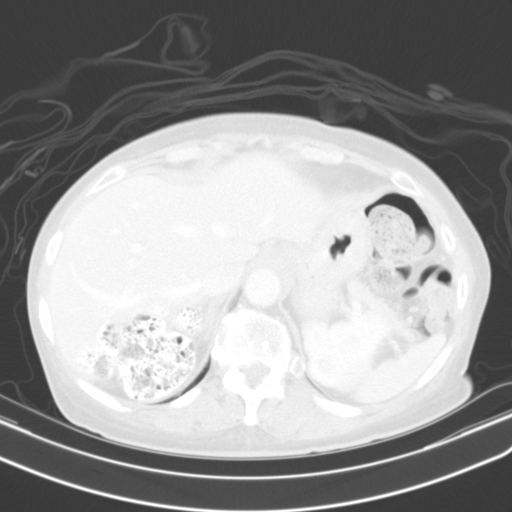
[im 75/90  lung]
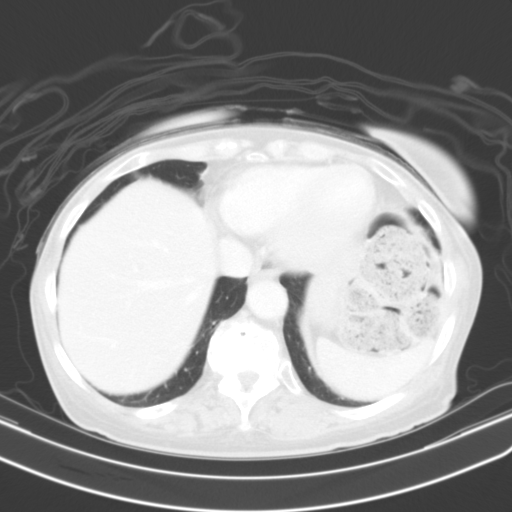
[im 80/90  soft-tissue]
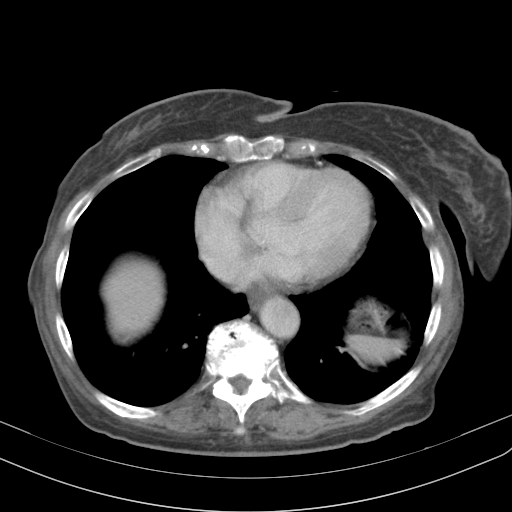
[im 80/90  lung]
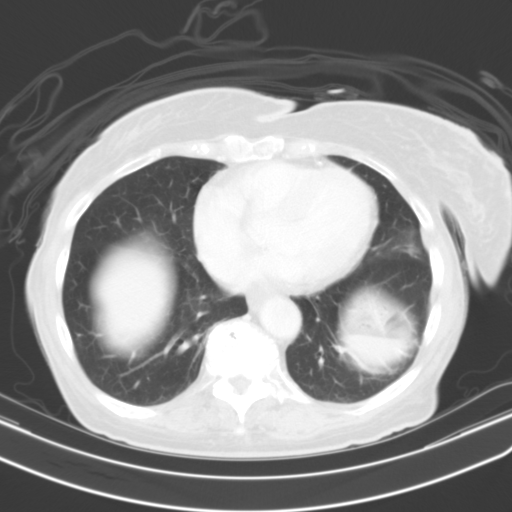
[im 85/90  soft-tissue]
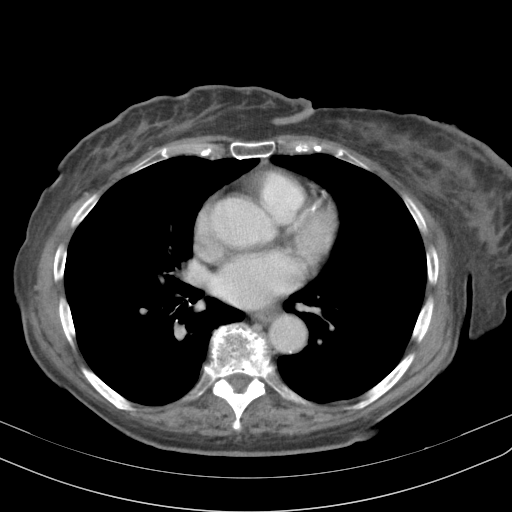
[im 85/90  lung]
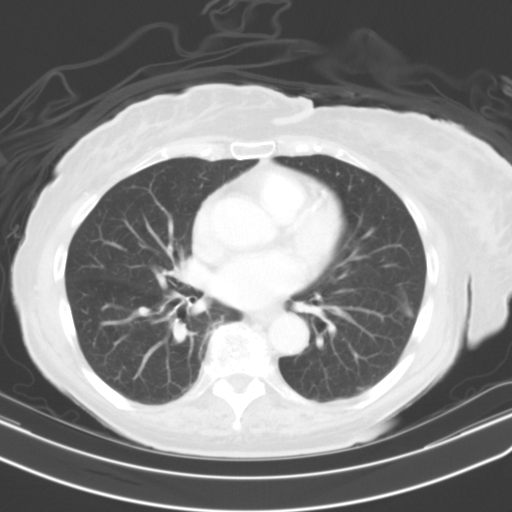

[14 of 32 positions shown; findings below may reference images not displayed]

FINDINGS: Lower Chest: No acute findings.

Hepatobiliary: No hepatic masses identified. Gallbladder is
unremarkable.

Pancreas:  No mass or inflammatory changes.

Spleen: Within normal limits in size and appearance.

Adrenals/Urinary Tract: No masses identified. No evidence of
hydronephrosis. Mild right renal parenchymal scarring and 7.5 cm
benign-appearing left renal cyst are again noted. Urinary bladder is
distended but otherwise unremarkable in appearance.

Stomach/Bowel: No evidence of obstruction, inflammatory process or
abnormal fluid collections. Large amount of stool seen throughout
the colon.

Vascular/Lymphatic: No pathologically enlarged lymph nodes. No
abdominal aortic aneurysm. Aortic atherosclerosis.

Reproductive: Prior hysterectomy again noted. Previously seen
complex fluid collection involving the vaginal cuff is no longer
visualized. No mass or other significant abnormality identified.

Other:  None.

Musculoskeletal:  No suspicious bone lesions identified.
IMPRESSION: No acute findings.

Previous hysterectomy. No residual fluid collection or mass
involving the vaginal cuff.

Large stool burden noted; recommend clinical correlation for
possible constipation.

## 2019-11-29 DIAGNOSIS — H401112 Primary open-angle glaucoma, right eye, moderate stage: Secondary | ICD-10-CM | POA: Diagnosis not present

## 2019-11-29 DIAGNOSIS — E113312 Type 2 diabetes mellitus with moderate nonproliferative diabetic retinopathy with macular edema, left eye: Secondary | ICD-10-CM | POA: Diagnosis not present

## 2019-11-29 DIAGNOSIS — H401123 Primary open-angle glaucoma, left eye, severe stage: Secondary | ICD-10-CM | POA: Diagnosis not present

## 2019-11-29 DIAGNOSIS — E113311 Type 2 diabetes mellitus with moderate nonproliferative diabetic retinopathy with macular edema, right eye: Secondary | ICD-10-CM | POA: Diagnosis not present

## 2019-12-05 ENCOUNTER — Other Ambulatory Visit: Payer: Medicare Other

## 2019-12-06 ENCOUNTER — Other Ambulatory Visit (INDEPENDENT_AMBULATORY_CARE_PROVIDER_SITE_OTHER): Payer: Medicare Other

## 2019-12-06 ENCOUNTER — Other Ambulatory Visit: Payer: Self-pay

## 2019-12-06 DIAGNOSIS — R3989 Other symptoms and signs involving the genitourinary system: Secondary | ICD-10-CM

## 2019-12-06 LAB — POCT UA - MICROSCOPIC ONLY

## 2019-12-06 LAB — POCT URINALYSIS DIP (MANUAL ENTRY)
Bilirubin, UA: NEGATIVE
Blood, UA: NEGATIVE
Glucose, UA: 500 mg/dL — AB
Ketones, POC UA: NEGATIVE mg/dL
Nitrite, UA: NEGATIVE
Protein Ur, POC: NEGATIVE mg/dL
Spec Grav, UA: 1.015 (ref 1.010–1.025)
Urobilinogen, UA: 0.2 E.U./dL
pH, UA: 5.5 (ref 5.0–8.0)

## 2020-01-29 DIAGNOSIS — E113311 Type 2 diabetes mellitus with moderate nonproliferative diabetic retinopathy with macular edema, right eye: Secondary | ICD-10-CM | POA: Diagnosis not present

## 2020-01-29 DIAGNOSIS — E113312 Type 2 diabetes mellitus with moderate nonproliferative diabetic retinopathy with macular edema, left eye: Secondary | ICD-10-CM | POA: Diagnosis not present

## 2020-01-29 DIAGNOSIS — H401123 Primary open-angle glaucoma, left eye, severe stage: Secondary | ICD-10-CM | POA: Diagnosis not present

## 2020-02-26 ENCOUNTER — Other Ambulatory Visit: Payer: Self-pay

## 2020-02-26 ENCOUNTER — Encounter: Payer: Self-pay | Admitting: Family Medicine

## 2020-02-26 ENCOUNTER — Ambulatory Visit (INDEPENDENT_AMBULATORY_CARE_PROVIDER_SITE_OTHER): Payer: Medicare Other | Admitting: Family Medicine

## 2020-02-26 VITALS — BP 145/80 | HR 96 | Ht 64.0 in | Wt 139.4 lb

## 2020-02-26 DIAGNOSIS — L98421 Non-pressure chronic ulcer of back limited to breakdown of skin: Secondary | ICD-10-CM | POA: Diagnosis not present

## 2020-02-26 DIAGNOSIS — E1140 Type 2 diabetes mellitus with diabetic neuropathy, unspecified: Secondary | ICD-10-CM

## 2020-02-26 DIAGNOSIS — M8580 Other specified disorders of bone density and structure, unspecified site: Secondary | ICD-10-CM

## 2020-02-26 DIAGNOSIS — I872 Venous insufficiency (chronic) (peripheral): Secondary | ICD-10-CM

## 2020-02-26 DIAGNOSIS — I10 Essential (primary) hypertension: Secondary | ICD-10-CM | POA: Diagnosis not present

## 2020-02-26 LAB — POCT GLYCOSYLATED HEMOGLOBIN (HGB A1C): HbA1c, POC (controlled diabetic range): 8.2 % — AB (ref 0.0–7.0)

## 2020-02-26 NOTE — Assessment & Plan Note (Signed)
No active ulcer present today on exam today.  Recommended frequent position changing and seat cushions to help protect formation of recurrent ulcers.  Patient to continue to monitor and RTC if worsening pain, erythema, discharge.

## 2020-02-26 NOTE — Assessment & Plan Note (Signed)
Lower extremity swelling present on exam today.  She notes this is chronic, and intermittent.  No other symptoms to indicate cardiac or renal contribution.  Last UA in April 2021 - for protein.  Recommended lower extremity elevation, low-salt diet, and compression stockings to help improve her swelling.  Patient voiced understanding agreement with plan.

## 2020-02-26 NOTE — Patient Instructions (Signed)
Thank you for coming to see me today. It was a pleasure to see you.   Please start a Vitamin D and Calcium Supplement.   - Take Vitamin D 1000IU daily  - Take Calcium 250-500mg  daily  We are checking some labs today, I will call you if they are abnormal will send you a MyChart message or a letter if they are normal.  If you do not hear about your labs in the next 2 weeks please let us know.  Please follow-up with me in 6 months for check up.  If you have any questions or concerns, please do not hesitate to call the office at [phone].  Take Care,  Dr. Mina Marble, DO Resident Physician [place] [phone]

## 2020-02-26 NOTE — Assessment & Plan Note (Signed)
Recommended starting calcium and vitamin D supplementation for bone protection.

## 2020-02-26 NOTE — Assessment & Plan Note (Addendum)
Chronic, controlled.  - continue Metformin 1000 mg BID - lifestyle modifications including healthy eating, portion control, and limiting carbohydrates - follow up 6 months for A1C, BMP, lipid panel

## 2020-02-26 NOTE — Assessment & Plan Note (Signed)
Blood pressure below goal.  Continue enalapril 20 mg daily.  Last BMP with normal kidney function.  Will obtain repeat BMP in December 2021.

## 2020-02-26 NOTE — Progress Notes (Signed)
   Subjective:   Patient ID: Hayley Fisher    DOB: November 22, 1930, 86 y.o. female   MRN: [id number]  Hayley Fisher is a 86 y.o. female with a history of hypertension, type 2 diabetes, degenerative disc disease, osteopenia, chronic skin ulcer at sacrum, pure hypercholesterolemia here for follow-up.  Diabetes: Last three A1C's below. Currently on Metformin 1000mg  BID. Endorses compliance. Denies any hypoglycemia. Notes CBG's average 250, lowest number 90. Notes her BS this AM was 112. Denies any polyuria, polydipsia, polyphagia.   Lab Results  Component Value Date   HGBA1C 8.2 (A) 02/26/2020   HGBA1C 7.0 10/31/2019   HGBA1C 6.8 08/05/2019    HTN:  BP: (!) 145/80 today. Currently on Enalapril 20mg  QD. Endorses compliance. Non-smoker. Denies any chest pain, SOB, vision changes, or headaches.   Moderate Osteopenia: Last Dexa scan in 2011 notable for T-score of -1.8. Not currently on any Vitamin D or calcium supplementation. Notes she had a fall about 1 month ago where she stepped down "a little too far" and just continued downward. Did not hit her head or lose consciousness.   Review of Systems:  Per HPI.   Objective:   BP (!) 145/80   Pulse 96   Ht 5\' 4"  (1.626 m)   Wt 139 lb 6.4 oz (63.2 kg)   SpO2 97%   BMI 23.93 kg/m  Vitals and nursing note reviewed.  General: Pleasant elderly female, sitting comfortably in exam chair, well nourished, well developed, in no acute distress with non-toxic appearance HEENT: normocephalic, atraumatic, moist mucous membranes, oropharynx without erythema or exudate Neck: supple, normal ROM CV: regular rate and rhythm without murmurs, rubs, or gallops, 2-3+ pitting edema to knees bilaterally Lungs: clear to auscultation bilaterally with normal work of breathing Skin: warm, dry, chronic hypopigmentation and thickened skin overlying where chronic skin ulcer was present at superior gluteal cleft, no erythema, discharge or bleeding, no active ulcer  present Extremities: warm and well perfused, normal tone MSK: gait normal but slow with rolling walker Neuro: Alert and oriented, speech normal  Assessment & Plan:   HYPERTENSION, BENIGN SYSTEMIC Blood pressure below goal.  Continue enalapril 20 mg daily.  Last BMP with normal kidney function.  Will obtain repeat BMP in December 2021.  Chronic venous insufficiency Lower extremity swelling present on exam today.  She notes this is chronic, and intermittent.  No other symptoms to indicate cardiac or renal contribution.  Last UA in April 2021 - for protein.  Recommended lower extremity elevation, low-salt diet, and compression stockings to help improve her swelling.  Patient voiced understanding agreement with plan.  Type 2 diabetes mellitus with diabetic neuropathy (HCC) Chronic, controlled.  - continue Metformin 1000 mg BID - lifestyle modifications including healthy eating, portion control, and limiting carbohydrates - follow up 6 months for A1C, BMP, lipid panel  Osteopenia Recommended starting calcium and vitamin D supplementation for bone protection.  Skin ulcer of sacrum, limited to breakdown of skin (HCC) No active ulcer present today on exam today.  Recommended frequent position changing and seat cushions to help protect formation of recurrent ulcers.  Patient to continue to monitor and RTC if worsening pain, erythema, discharge.  Orders Placed This Encounter  Procedures  . POCT glycosylated hemoglobin (Hb A1C)   No orders of the defined types were placed in this encounter.   Mina Marble, DO PGY-3, [facility] 02/26/2020 5:03 PM

## 2020-03-26 ENCOUNTER — Other Ambulatory Visit: Payer: Self-pay | Admitting: Family Medicine

## 2020-03-26 DIAGNOSIS — Z794 Long term (current) use of insulin: Secondary | ICD-10-CM

## 2020-05-08 ENCOUNTER — Other Ambulatory Visit: Payer: Self-pay | Admitting: Family Medicine

## 2020-05-27 DIAGNOSIS — E113311 Type 2 diabetes mellitus with moderate nonproliferative diabetic retinopathy with macular edema, right eye: Secondary | ICD-10-CM | POA: Diagnosis not present

## 2020-05-27 DIAGNOSIS — E113312 Type 2 diabetes mellitus with moderate nonproliferative diabetic retinopathy with macular edema, left eye: Secondary | ICD-10-CM | POA: Diagnosis not present

## 2020-06-17 ENCOUNTER — Other Ambulatory Visit: Payer: Self-pay | Admitting: Family Medicine

## 2020-06-17 DIAGNOSIS — I10 Essential (primary) hypertension: Secondary | ICD-10-CM

## 2020-06-18 NOTE — Assessment & Plan Note (Addendum)
Chronic.  - lipid panel obtained today for follow up and at goal - continue lipitor 40mg  QD

## 2020-06-18 NOTE — Assessment & Plan Note (Addendum)
Chronic, uncontrolled. A1C 10.2 today with hyperglycemia.  A1C goal <8.  - Continue Metformin 1000mg  BID - Start Victoza 0.6mg  daily, increase to 1.2mg  daily after 1 week - continue lifestyle modifications including healthy eating, portion control and limiting carbohydrates - due for diabetic foot exam - plan to get at f/u visit in Jan - follow up 3 months

## 2020-06-18 NOTE — Assessment & Plan Note (Addendum)
Chronic, below goal. Continue Enalapril 20mg  QD BMP obtained with stable kidney function

## 2020-06-18 NOTE — Progress Notes (Signed)
Subjective:   Patient ID: Hayley Fisher    DOB: [birth year], 86 y.o. female   MRN: [id number]  Stanislawa Gaffin is a 86 y.o. female with a history of HTN, chronic venous insufficiency, T2DM, DDD of lumbosacral area, osteopenia, chronic skin sore of sacrum, HLD here for chronic condition follow up.  Frequent Urination: Patient endorses frequent urination. Denies dysuria. Has noted some spots of blood on her depends at times. She also noted some abnormal smell from her genital area.  Diabetes: Last three A1C's below. Currently on Metformin 500mg  QD. Endorses compliance. Denies any hypoglycemia. Endorses polyuria. Due for diabetic foot exam  Lab Results  Component Value Date   HGBA1C 10.2 (A) 06/19/2020   HGBA1C 8.2 (A) 02/26/2020   HGBA1C 7.0 10/31/2019    Health Maintenance: Health Maintenance Due  Topic  . FOOT EXAM   . INFLUENZA VACCINE     Review of Systems:  Per HPI.   Objective:   BP (!) 146/90   Pulse 88   Wt 130 lb 6.4 oz (59.1 kg)   SpO2 96%   BMI 22.38 kg/m  Vitals and nursing note reviewed.  General: pleasant elderly woman, sitting comfortably in exam chair, well nourished, well developed, in no acute distress with non-toxic appearance Resp: breathing comfortably on room air, speaking in full sentences Skin: warm, dry, thickened skin-colored patch on superior sacrum without erythema, edema, bleeding or discharge  Extremities: warm and well perfused, normal tone MSK:  gait normal Neuro: Alert and oriented, speech normal Pelvic exam: VULVA: normal appearing vulva with no masses, tenderness or lesions, VAGINA: severely atrophic, scant blood obtained with application of q-tip to vaginal wall, exam chaperoned by Eusebio Friendly.  Assessment & Plan:   HYPERTENSION, BENIGN SYSTEMIC Chronic, below goal. Continue Enalapril 20mg  QD BMP obtained with stable kidney function  Type 2 diabetes mellitus with diabetic neuropathy (HCC) Chronic, uncontrolled. A1C 10.2 today  with hyperglycemia.  A1C goal <8.  - Continue Metformin 1000mg  BID - Start Victoza 0.6mg  daily, increase to 1.2mg  daily after 1 week - continue lifestyle modifications including healthy eating, portion control and limiting carbohydrates - due for diabetic foot exam - plan to get at f/u visit in Jan - follow up 3 months  Hyperlipidemia associated with type 2 diabetes mellitus ([place]) Chronic.  - lipid panel obtained today for follow up and at goal - continue lipitor 40mg  QD  Vaginal candidiasis Likely secondary to hyperglycemia. Diflucan 150mg  x 1, then repeat in 72 hours.  Skin ulcer of sacrum, limited to breakdown of skin ([place]) Thickened skin. No evidence of breakdown or infection. Provided reassurance. Recommended frequent repositioning. Patient to continue to monitor and RTC if increase pain or develops discharge or swelling  Atrophic vaginitis Evident on pelvic exam. No obvious blood present however application of Q-tip to vaginal wall resulted in scant blood. Likely cause for spots of blood complicated by acute vaginal candidiasis.  Osteopenia Order placed. Will discuss and have patient schedule in jan 2022.  Positive Blood on UA Dipstick: No UTI symptoms. Small leuks, negative nitrites, moderate bacteria. Suspect colonization. Vaginal exam notable for significant atrophy complicated with candidiasis. Given asymptomatic, will treat yeast infection and hyperglycemia. Suspect false positive blood on dipstick. Given age and asymptomatic will not further evaluate at this time.  Orders Placed This Encounter  Procedures  . DG Bone Density    Standing Status:   Future    Standing Expiration Date:   06/18/2021    Order Specific Question:   Reason  for Exam (SYMPTOM  OR DIAGNOSIS REQUIRED)    Answer:   osteopenia    Order Specific Question:   Preferred imaging location?    Answer:   [facility]  . Basic Metabolic Panel  . Lipid Panel  . POCT glycosylated hemoglobin (Hb A1C)  .  POCT urinalysis dipstick  . POCT Wet Prep [employer])  . POCT UA - Microscopic Only   Meds ordered this encounter  Medications  . liraglutide (VICTOZA) 18 MG/3ML SOPN    Sig: Inject 0.6 mg into the skin daily. 0.6 mg once daily for 1 week,then increase to 1.2 mg once daily.    Dispense:  6 mL    Refill:  3  . fluconazole (DIFLUCAN) 150 MG tablet    Sig: Take 1 tablet (150 mg total) by mouth once for 1 dose. Then take 1 tablet in 72 hours.    Dispense:  2 tablet    Refill:  0    Mina Marble, DO PGY-3, [facility] 06/22/2020 5:43 PM

## 2020-06-19 ENCOUNTER — Other Ambulatory Visit: Payer: Self-pay

## 2020-06-19 ENCOUNTER — Encounter: Payer: Self-pay | Admitting: Family Medicine

## 2020-06-19 ENCOUNTER — Ambulatory Visit (INDEPENDENT_AMBULATORY_CARE_PROVIDER_SITE_OTHER): Payer: Medicare Other | Admitting: Family Medicine

## 2020-06-19 VITALS — BP 146/90 | HR 88 | Wt 130.4 lb

## 2020-06-19 DIAGNOSIS — B373 Candidiasis of vulva and vagina: Secondary | ICD-10-CM | POA: Diagnosis not present

## 2020-06-19 DIAGNOSIS — R3129 Other microscopic hematuria: Secondary | ICD-10-CM

## 2020-06-19 DIAGNOSIS — E1140 Type 2 diabetes mellitus with diabetic neuropathy, unspecified: Secondary | ICD-10-CM | POA: Diagnosis not present

## 2020-06-19 DIAGNOSIS — Z13820 Encounter for screening for osteoporosis: Secondary | ICD-10-CM

## 2020-06-19 DIAGNOSIS — E1169 Type 2 diabetes mellitus with other specified complication: Secondary | ICD-10-CM

## 2020-06-19 DIAGNOSIS — B3731 Acute candidiasis of vulva and vagina: Secondary | ICD-10-CM

## 2020-06-19 DIAGNOSIS — E785 Hyperlipidemia, unspecified: Secondary | ICD-10-CM

## 2020-06-19 DIAGNOSIS — I10 Essential (primary) hypertension: Secondary | ICD-10-CM

## 2020-06-19 DIAGNOSIS — N952 Postmenopausal atrophic vaginitis: Secondary | ICD-10-CM

## 2020-06-19 DIAGNOSIS — N898 Other specified noninflammatory disorders of vagina: Secondary | ICD-10-CM

## 2020-06-19 DIAGNOSIS — Z794 Long term (current) use of insulin: Secondary | ICD-10-CM

## 2020-06-19 DIAGNOSIS — L98421 Non-pressure chronic ulcer of back limited to breakdown of skin: Secondary | ICD-10-CM

## 2020-06-19 DIAGNOSIS — M8580 Other specified disorders of bone density and structure, unspecified site: Secondary | ICD-10-CM

## 2020-06-19 LAB — POCT URINALYSIS DIP (MANUAL ENTRY)
Bilirubin, UA: NEGATIVE
Glucose, UA: >=1000 mg/dL — AB
Nitrite, UA: NEGATIVE
Protein Ur, POC: NEGATIVE mg/dL
Spec Grav, UA: 1.015 (ref 1.010–1.025)
Urobilinogen, UA: 0.2 E.U./dL
pH, UA: 5 (ref 5.0–8.0)

## 2020-06-19 LAB — POCT WET PREP (WET MOUNT)
Clue Cells Wet Prep Whiff POC: NEGATIVE
Trichomonas Wet Prep HPF POC: ABSENT

## 2020-06-19 LAB — POCT GLYCOSYLATED HEMOGLOBIN (HGB A1C): Hemoglobin A1C: 10.2 % — AB (ref 4.0–5.6)

## 2020-06-19 LAB — POCT UA - MICROSCOPIC ONLY

## 2020-06-19 NOTE — Patient Instructions (Signed)
It was a pleasure to see you today!  Thank you for choosing [facility] for your primary care.  Hayley Fisher was seen for diabetes  Our plans for today were:  Your A1C increased. I will start a new medicine. I will call you with the details of this medicien  I have obtained labs. I will call you with the results and recommended treatments.    You should return to our clinic in 3 months for diabetes follow up.   Best Wishes,   Mina Marble, DO

## 2020-06-20 LAB — BASIC METABOLIC PANEL
BUN/Creatinine Ratio: 21 (ref 12–28)
BUN: 16 mg/dL (ref 8–27)
CO2: 25 mmol/L (ref 20–29)
Calcium: 9.1 mg/dL (ref 8.7–10.3)
Chloride: 95 mmol/L — ABNORMAL LOW (ref 96–106)
Creatinine, Ser: 0.75 mg/dL (ref 0.57–1.00)
GFR calc Af Amer: 83 mL/min/{1.73_m2} (ref >59)
GFR calc non Af Amer: 72 mL/min/{1.73_m2} (ref >59)
Glucose: 357 mg/dL — ABNORMAL HIGH (ref 65–99)
Potassium: 4.2 mmol/L (ref 3.5–5.2)
Sodium: 134 mmol/L (ref 134–144)

## 2020-06-20 LAB — LIPID PANEL
Chol/HDL Ratio: 2 ratio (ref 0.0–4.4)
Cholesterol, Total: 137 mg/dL (ref 100–199)
HDL: 67 mg/dL (ref >39)
LDL Chol Calc (NIH): 57 mg/dL (ref 0–99)
Triglycerides: 61 mg/dL (ref 0–149)
VLDL Cholesterol Cal: 13 mg/dL (ref 5–40)

## 2020-06-21 DIAGNOSIS — N952 Postmenopausal atrophic vaginitis: Secondary | ICD-10-CM | POA: Insufficient documentation

## 2020-06-21 DIAGNOSIS — B373 Candidiasis of vulva and vagina: Secondary | ICD-10-CM | POA: Insufficient documentation

## 2020-06-21 DIAGNOSIS — R3129 Other microscopic hematuria: Secondary | ICD-10-CM | POA: Insufficient documentation

## 2020-06-21 DIAGNOSIS — B3731 Acute candidiasis of vulva and vagina: Secondary | ICD-10-CM | POA: Insufficient documentation

## 2020-06-21 MED ORDER — FLUCONAZOLE 150 MG PO TABS: 150.0000 mg | ORAL_TABLET | Freq: Once | ORAL | 0 refills | Status: AC

## 2020-06-21 MED ORDER — LIRAGLUTIDE 18 MG/3ML ~~LOC~~ SOPN: 0.6000 mg | PEN_INJECTOR | Freq: Every day | SUBCUTANEOUS | 3 refills | Status: DC

## 2020-06-21 NOTE — Assessment & Plan Note (Deleted)
No UTI symptoms. Small leuks, negative nitrites, moderate bacteria. Suspect colonization. Vaginal exam notable for significant atrophy complicated with candidiasis. Given asymptomatic, will treat yeast infection and hyperglycemia and obtain repeat UA +/- urine culture in 1 month to evaluate for resolution.  - lab visit scheduled for 12/1, future orders placed

## 2020-06-21 NOTE — Assessment & Plan Note (Addendum)
Thickened skin. No evidence of breakdown or infection. Provided reassurance. Recommended frequent repositioning. Patient to continue to monitor and RTC if increase pain or develops discharge or swelling

## 2020-06-21 NOTE — Assessment & Plan Note (Signed)
Likely secondary to hyperglycemia. Diflucan 150mg  x 1, then repeat in 72 hours.

## 2020-06-21 NOTE — Assessment & Plan Note (Signed)
Evident on pelvic exam. No obvious blood present however application of Q-tip to vaginal wall resulted in scant blood. Likely cause for spots of blood complicated by acute vaginal candidiasis.

## 2020-06-22 NOTE — Assessment & Plan Note (Signed)
Order placed. Will discuss and have patient schedule in jan 2022.

## 2020-06-29 ENCOUNTER — Other Ambulatory Visit: Payer: Self-pay | Admitting: Family Medicine

## 2020-06-29 DIAGNOSIS — E1140 Type 2 diabetes mellitus with diabetic neuropathy, unspecified: Secondary | ICD-10-CM

## 2020-07-02 ENCOUNTER — Encounter (HOSPITAL_COMMUNITY): Payer: Self-pay | Admitting: Emergency Medicine

## 2020-07-02 ENCOUNTER — Emergency Department (HOSPITAL_COMMUNITY): Payer: Medicare Other

## 2020-07-02 ENCOUNTER — Other Ambulatory Visit: Payer: Self-pay

## 2020-07-02 ENCOUNTER — Inpatient Hospital Stay (HOSPITAL_COMMUNITY)
Admission: EM | Admit: 2020-07-02 | Discharge: 2020-07-06 | DRG: 639 | Disposition: A | Discharge status: Skilled Nursing Facility | Payer: Medicare Other | Attending: Internal Medicine | Admitting: Internal Medicine

## 2020-07-02 DIAGNOSIS — R000 Tachycardia, unspecified: Secondary | ICD-10-CM | POA: Diagnosis not present

## 2020-07-02 DIAGNOSIS — E860 Dehydration: Secondary | ICD-10-CM

## 2020-07-02 DIAGNOSIS — I10 Essential (primary) hypertension: Secondary | ICD-10-CM | POA: Diagnosis not present

## 2020-07-02 DIAGNOSIS — R279 Unspecified lack of coordination: Secondary | ICD-10-CM | POA: Diagnosis not present

## 2020-07-02 DIAGNOSIS — Z800 Family history of malignant neoplasm of digestive organs: Secondary | ICD-10-CM | POA: Diagnosis not present

## 2020-07-02 DIAGNOSIS — E785 Hyperlipidemia, unspecified: Secondary | ICD-10-CM | POA: Diagnosis present

## 2020-07-02 DIAGNOSIS — H409 Unspecified glaucoma: Secondary | ICD-10-CM | POA: Diagnosis present

## 2020-07-02 DIAGNOSIS — Z743 Need for continuous supervision: Secondary | ICD-10-CM | POA: Diagnosis not present

## 2020-07-02 DIAGNOSIS — Z87891 Personal history of nicotine dependence: Secondary | ICD-10-CM

## 2020-07-02 DIAGNOSIS — Z803 Family history of malignant neoplasm of breast: Secondary | ICD-10-CM | POA: Diagnosis not present

## 2020-07-02 DIAGNOSIS — E1165 Type 2 diabetes mellitus with hyperglycemia: Secondary | ICD-10-CM

## 2020-07-02 DIAGNOSIS — R739 Hyperglycemia, unspecified: Secondary | ICD-10-CM | POA: Diagnosis not present

## 2020-07-02 DIAGNOSIS — E875 Hyperkalemia: Secondary | ICD-10-CM

## 2020-07-02 DIAGNOSIS — Z20822 Contact with and (suspected) exposure to covid-19: Secondary | ICD-10-CM | POA: Diagnosis present

## 2020-07-02 DIAGNOSIS — Z833 Family history of diabetes mellitus: Secondary | ICD-10-CM | POA: Diagnosis not present

## 2020-07-02 DIAGNOSIS — E1169 Type 2 diabetes mellitus with other specified complication: Secondary | ICD-10-CM | POA: Diagnosis not present

## 2020-07-02 DIAGNOSIS — E1159 Type 2 diabetes mellitus with other circulatory complications: Secondary | ICD-10-CM | POA: Diagnosis present

## 2020-07-02 DIAGNOSIS — E1100 Type 2 diabetes mellitus with hyperosmolarity without nonketotic hyperglycemic-hyperosmolar coma (NKHHC): Secondary | ICD-10-CM | POA: Diagnosis not present

## 2020-07-02 DIAGNOSIS — Z7984 Long term (current) use of oral hypoglycemic drugs: Secondary | ICD-10-CM | POA: Diagnosis not present

## 2020-07-02 DIAGNOSIS — E872 Acidosis, unspecified: Secondary | ICD-10-CM

## 2020-07-02 DIAGNOSIS — Z888 Allergy status to other drugs, medicaments and biological substances status: Secondary | ICD-10-CM | POA: Diagnosis not present

## 2020-07-02 DIAGNOSIS — E1110 Type 2 diabetes mellitus with ketoacidosis without coma: Principal | ICD-10-CM | POA: Diagnosis present

## 2020-07-02 DIAGNOSIS — Z7982 Long term (current) use of aspirin: Secondary | ICD-10-CM

## 2020-07-02 DIAGNOSIS — I499 Cardiac arrhythmia, unspecified: Secondary | ICD-10-CM | POA: Diagnosis not present

## 2020-07-02 DIAGNOSIS — Z79899 Other long term (current) drug therapy: Secondary | ICD-10-CM

## 2020-07-02 DIAGNOSIS — M6281 Muscle weakness (generalized): Secondary | ICD-10-CM | POA: Diagnosis not present

## 2020-07-02 DIAGNOSIS — N390 Urinary tract infection, site not specified: Secondary | ICD-10-CM

## 2020-07-02 DIAGNOSIS — R0902 Hypoxemia: Secondary | ICD-10-CM | POA: Diagnosis not present

## 2020-07-02 DIAGNOSIS — R531 Weakness: Secondary | ICD-10-CM | POA: Diagnosis not present

## 2020-07-02 DIAGNOSIS — F0390 Unspecified dementia without behavioral disturbance: Secondary | ICD-10-CM | POA: Diagnosis present

## 2020-07-02 HISTORY — DX: Hyperkalemia: E87.5

## 2020-07-02 HISTORY — DX: Acidosis: E87.2

## 2020-07-02 HISTORY — DX: Acidosis, unspecified: E87.20

## 2020-07-02 LAB — BLOOD GAS, VENOUS
Acid-base deficit: 14.2 mmol/L — ABNORMAL HIGH (ref 0.0–2.0)
Bicarbonate: 12.5 mmol/L — ABNORMAL LOW (ref 20.0–28.0)
O2 Saturation: 47.6 %
Patient temperature: 98.6
pCO2, Ven: 33 mmHg — ABNORMAL LOW (ref 44.0–60.0)
pH, Ven: 7.203 — ABNORMAL LOW (ref 7.250–7.430)
pO2, Ven: 31.9 mmHg — CL (ref 32.0–45.0)

## 2020-07-02 LAB — URINALYSIS, ROUTINE W REFLEX MICROSCOPIC
Bilirubin Urine: NEGATIVE
Glucose, UA: >=500 mg/dL — AB
Hgb urine dipstick: NEGATIVE
Ketones, ur: 80 mg/dL — AB
Nitrite: NEGATIVE
Protein, ur: NEGATIVE mg/dL
Specific Gravity, Urine: 1.021 (ref 1.005–1.030)
pH: 5 (ref 5.0–8.0)

## 2020-07-02 LAB — CBC
HCT: 40.2 % (ref 36.0–46.0)
Hemoglobin: 12.5 g/dL (ref 12.0–15.0)
MCH: 31.2 pg (ref 26.0–34.0)
MCHC: 31.1 g/dL (ref 30.0–36.0)
MCV: 100.2 fL — ABNORMAL HIGH (ref 80.0–100.0)
Platelets: 235 10*3/uL (ref 150–400)
RBC: 4.01 MIL/uL (ref 3.87–5.11)
RDW: 13.8 % (ref 11.5–15.5)
WBC: 4.2 10*3/uL (ref 4.0–10.5)
nRBC: 0 % (ref 0.0–0.2)

## 2020-07-02 LAB — BASIC METABOLIC PANEL
Anion gap: 22 — ABNORMAL HIGH (ref 5–15)
BUN: 31 mg/dL — ABNORMAL HIGH (ref 8–23)
CO2: 13 mmol/L — ABNORMAL LOW (ref 22–32)
Calcium: 9.1 mg/dL (ref 8.9–10.3)
Chloride: 98 mmol/L (ref 98–111)
Creatinine, Ser: 1.14 mg/dL — ABNORMAL HIGH (ref 0.44–1.00)
GFR, Estimated: 47 mL/min — ABNORMAL LOW (ref >60)
Glucose, Bld: 610 mg/dL (ref 70–99)
Potassium: 6 mmol/L — ABNORMAL HIGH (ref 3.5–5.1)
Sodium: 133 mmol/L — ABNORMAL LOW (ref 135–145)

## 2020-07-02 LAB — CBG MONITORING, ED
Glucose-Capillary: 348 mg/dL — ABNORMAL HIGH (ref 70–99)
Glucose-Capillary: 378 mg/dL — ABNORMAL HIGH (ref 70–99)
Glucose-Capillary: 544 mg/dL (ref 70–99)

## 2020-07-02 MED ORDER — DEXTROSE 50 % IV SOLN: 0.0000 mL | INTRAVENOUS | Status: DC | PRN

## 2020-07-02 MED ORDER — SODIUM CHLORIDE 0.9 % IV SOLN: INTRAVENOUS | Status: DC

## 2020-07-02 MED ORDER — INSULIN REGULAR(HUMAN) IN NACL 100-0.9 UT/100ML-% IV SOLN
INTRAVENOUS | Status: DC
  Administered 2020-07-02: 8.5 [IU]/h via INTRAVENOUS
  Filled 2020-07-02: qty 100

## 2020-07-02 MED ORDER — INSULIN ASPART 100 UNIT/ML ~~LOC~~ SOLN
10.0000 [IU] | Freq: Once | SUBCUTANEOUS | Status: AC
  Administered 2020-07-02: 10 [IU] via INTRAVENOUS
  Filled 2020-07-02: qty 0.1

## 2020-07-02 MED ORDER — SODIUM CHLORIDE 0.9 % IV BOLUS
500.0000 mL | Freq: Once | INTRAVENOUS | Status: AC
  Administered 2020-07-02: 500 mL via INTRAVENOUS

## 2020-07-02 MED ORDER — DIPHENHYDRAMINE HCL 50 MG/ML IJ SOLN
12.5000 mg | Freq: Once | INTRAMUSCULAR | Status: AC
  Administered 2020-07-02: 12.5 mg via INTRAVENOUS
  Filled 2020-07-02: qty 1

## 2020-07-02 MED ORDER — DEXTROSE IN LACTATED RINGERS 5 % IV SOLN: INTRAVENOUS | Status: DC

## 2020-07-02 MED ORDER — LACTATED RINGERS IV SOLN: INTRAVENOUS | Status: DC

## 2020-07-02 NOTE — ED Provider Notes (Addendum)
[facility] DEPT Provider Note   CSN: [id number] Arrival date & time: 07/02/20  1813     History Chief Complaint  Patient presents with  . Hyperglycemia    Hayley Fisher is a 86 y.o. female.  Patient brought in by EMS from home.  Complaining of weakness confusion but patient's main complaint to me was that she was thirsty.  Patient upon arrival here was quite alert and oriented.  Patient not appearing lethargic.  Patient was seen by family medicine at [facility] who she is followed by on October 29.  They noted that her A1c on that day was 10.2.  Blood sugars that were running high.  Her was Metformin was to be continued at 1000 mg twice daily.  And they started Victoza 0.6 mg daily which was to be increased to 1.2 mg after 1 week.  Not clear whether patient did that increase or not.  Patient afebrile here heart rate around 100.  Blood pressure 149/81 room air oxygen sats upper 90s to 100%.  Respiratory rate 16        Past Medical History:  Diagnosis Date  . Arthritis   . Arthritis   . Cataract    bil cateracts removed  . Chronic kidney disease    "spot on one of my kidneys" per pt  . Diabetes mellitus   . Glaucoma   . Hyperlipidemia   . Hyperplastic colon polyp   . Hypertension   . Syncope     Patient Active Problem List   Diagnosis Date Noted  . Vaginal candidiasis 06/21/2020  . Atrophic vaginitis 06/21/2020  . Skin ulcer of sacrum, limited to breakdown of skin ([place]) 03/16/2017  . Chronic venous insufficiency 11/09/2015  . DDD (degenerative disc disease), lumbosacral 11/24/2014  . Type 2 diabetes mellitus with diabetic neuropathy ([place]) 10/19/2006  . Hyperlipidemia associated with type 2 diabetes mellitus ([place]) 10/19/2006  . HYPERTENSION, BENIGN SYSTEMIC 10/19/2006  . Osteopenia 10/19/2006    Past Surgical History:  Procedure Laterality Date  . ABDOMINAL HYSTERECTOMY    . BLADDER SUSPENSION    . bladder tacking    . COLONOSCOPY      . EYE SURGERY    . INGUINAL HERNIA REPAIR Right      OB History   No obstetric history on file.     Family History  Problem Relation Age of Onset  . Colon cancer Mother   . Other Father        cerebral hemorrhage  . Diabetes Brother   . Diabetes Sister        x 2  . Bone cancer Daughter   . Breast cancer Daughter 12  . Esophageal cancer Neg Hx   . Rectal cancer Neg Hx   . Stomach cancer Neg Hx     Social History   Tobacco Use  . Smoking status: Former Smoker    Types: Cigarettes    Quit date: 11/29/1980    Years since quitting: 39.6  . Smokeless tobacco: Never Used  Substance Use Topics  . Alcohol use: No    Alcohol/week: 0.0 standard drinks  . Drug use: No    Home Medications Prior to Admission medications   Medication Sig Start Date End Date Taking? Authorizing Provider  ACCU-CHEK AVIVA PLUS test strip USE TO TEST BLOOD SUGAR UP TO 3 TIMES A DAY. 06/30/20   Mullis, Kiersten P, DO  Accu-Chek Softclix Lancets lancets USE TO TEST BLOOD SUGAR UP TO 3 TIMES A DAY. 06/30/20  Mullis, Kiersten P, DO  aspirin 81 MG chewable tablet Chew 81 mg by mouth every morning.    Yes [provider]  atorvastatin (LIPITOR) 40 MG tablet Take 1 tablet (40 mg total) by mouth daily. 07/24/19  Yes Milus Banister C, DO  brimonidine (ALPHAGAN P) 0.1 % SOLN Place 1 drop into the left eye 2 (two) times daily.  09/20/11  Yes   carbamide peroxide (DEBROX) 6.5 % OTIC solution Place 5 drops into both ears 2 (two) times daily. Patient taking differently: Place 2 drops into both ears 2 (two) times daily.  10/16/18  Yes Alveda Reasons, MD  dorzolamide-timolol (COSOPT) 22.3-6.8 MG/ML ophthalmic solution Place 1 drop into the left eye 2 (two) times daily. 05/18/20  Yes [provider]  DULoxetine (CYMBALTA) 60 MG capsule TAKE 1 CAPSULE BY MOUTH EVERY DAY Patient taking differently: Take 60 mg by mouth daily.  10/22/18  Yes Harriet Butte, DO  enalapril (VASOTEC) 20 MG tablet TAKE 1  TABLET BY MOUTH EVERY DAY Patient taking differently: Take 20 mg by mouth daily.  06/17/20  Yes Mullis, Kiersten P, DO  liraglutide (VICTOZA) 18 MG/3ML SOPN Inject 0.6 mg into the skin daily. 0.6 mg once daily for 1 week,then increase to 1.2 mg once daily. Patient taking differently: Inject 0.6 mg into the skin daily.  06/21/20  Yes Mullis, Kiersten P, DO  metFORMIN (GLUCOPHAGE-XR) 500 MG 24 hr tablet TAKE 2 TABLETS BY MOUTH 2 TIMES DAILY Patient taking differently: Take 1,000 mg by mouth in the morning and at bedtime.  03/26/20  Yes Mullis, Kiersten P, DO  VYZULTA 0.024 % SOLN Place 1 drop into the right eye daily. 11/06/17  Yes [provider]  albuterol (PROVENTIL HFA;VENTOLIN HFA) 108 (90 Base) MCG/ACT inhaler Inhale 2 puffs into the lungs every 6 (six) hours as needed for wheezing or shortness of breath. Patient not taking: Reported on 07/02/2020 10/16/18   Alveda Reasons, MD  hydrocortisone 2.5 % cream Apply 1 application rectally 2 times daily Patient not taking: Reported on 07/02/2020 10/16/18   Alveda Reasons, MD  mupirocin ointment (BACTROBAN) 2 % Place 1 application into the nose as needed. Patient not taking: Reported on 07/02/2020 03/19/19   Mina Marble P, DO  Accu-Chek Softclix Lancets lancets USE TO TEST BLOOD SUGAR UP  TO 3 TIMES A DAY. 05/17/19   Mullis, Kiersten P, DO  glucose blood (ACCU-CHEK AVIVA PLUS) test strip USE TO TEST BLOOD SUGAR UP  TO 3 TIMES A DAY. 05/17/19   Mullis, Kiersten P, DO    Allergies    Levemir [insulin detemir]  Review of Systems   Review of Systems  Constitutional: Negative for chills and fever.  HENT: Negative for congestion, rhinorrhea and sore throat.   Eyes: Negative for visual disturbance.  Respiratory: Negative for cough and shortness of breath.   Cardiovascular: Negative for chest pain and leg swelling.  Gastrointestinal: Negative for abdominal pain, diarrhea, nausea and vomiting.  Endocrine: Positive for polyuria.    Genitourinary: Positive for frequency. Negative for dysuria.  Musculoskeletal: Negative for back pain and neck pain.  Skin: Negative for rash.  Neurological: Negative for dizziness, light-headedness and headaches.  Hematological: Does not bruise/bleed easily.  Psychiatric/Behavioral: Negative for confusion.    Physical Exam Updated Vital Signs BP 129/74 (BP Location: Left Arm)   Pulse (!) 102   Temp 97.9 F (36.6 C) (Oral)   Resp 15   SpO2 94%   Physical Exam Vitals and nursing note reviewed.  Constitutional:      General: She is not in acute distress.    Appearance: Normal appearance. She is well-developed. She is not ill-appearing or toxic-appearing.  HENT:     Head: Normocephalic and atraumatic.  Eyes:     Extraocular Movements: Extraocular movements intact.     Conjunctiva/sclera: Conjunctivae normal.     Pupils: Pupils are equal, round, and reactive to light.  Cardiovascular:     Rate and Rhythm: Regular rhythm. Tachycardia present.     Heart sounds: No murmur heard.   Pulmonary:     Effort: Pulmonary effort is normal. No respiratory distress.     Breath sounds: Normal breath sounds.  Abdominal:     Palpations: Abdomen is soft.     Tenderness: There is no abdominal tenderness.  Musculoskeletal:        General: Swelling present.     Cervical back: Neck supple.  Skin:    General: Skin is warm and dry.  Neurological:     General: No focal deficit present.     Mental Status: She is alert and oriented to person, place, and time.     Cranial Nerves: No cranial nerve deficit.     Sensory: No sensory deficit.     Motor: No weakness.     ED Results / Procedures / Treatments   Labs (all labs ordered are listed, but only abnormal results are displayed) Labs Reviewed  BASIC METABOLIC PANEL - Abnormal; Notable for the following components:      Result Value   Sodium 133 (*)    Potassium 6.0 (*)    CO2 13 (*)    Glucose, Bld 610 (*)    BUN 31 (*)    Creatinine,  Ser 1.14 (*)    GFR, Estimated 47 (*)    Anion gap 22 (*)    All other components within normal limits  CBC - Abnormal; Notable for the following components:   MCV 100.2 (*)    All other components within normal limits  URINALYSIS, ROUTINE W REFLEX MICROSCOPIC - Abnormal; Notable for the following components:   APPearance HAZY (*)    Glucose, UA >=500 (*)    Ketones, ur 80 (*)    Leukocytes,Ua MODERATE (*)    Bacteria, UA RARE (*)    All other components within normal limits  BLOOD GAS, VENOUS - Abnormal; Notable for the following components:   pH, Ven 7.203 (*)    pCO2, Ven 33.0 (*)    pO2, Ven 31.9 (*)    Bicarbonate 12.5 (*)    Acid-base deficit 14.2 (*)    All other components within normal limits  CBG MONITORING, ED - Abnormal; Notable for the following components:   Glucose-Capillary 544 (*)    All other components within normal limits  RESPIRATORY PANEL BY RT PCR (FLU A&B, COVID)  URINE CULTURE  BETA-HYDROXYBUTYRIC ACID  OSMOLALITY  CBG MONITORING, ED  CBG MONITORING, ED  CBG MONITORING, ED  CBG MONITORING, ED    EKG EKG Interpretation  Date/Time:  Thursday July 02 2020 20:32:52 EST Ventricular Rate:  99 PR Interval:    QRS Duration: 95 QT Interval:  348 QTC Calculation: 447 R Axis:   32 Text Interpretation: Sinus tachycardia LAE, consider biatrial enlargement Consider inferior infarct Consider anterior infarct Nonspecific T abnormalities, lateral leads Confirmed by Fredia Sorrow [phone]) on 07/02/2020 8:42:52 PM   Radiology DG Chest Port 1 View  Result Date: 07/02/2020 CLINICAL DATA:  Hyperglycemia EXAM: PORTABLE CHEST 1 VIEW COMPARISON:  07/09/2018 FINDINGS: The heart size and mediastinal contours are within normal limits. Both lungs are clear. The visualized skeletal structures are unremarkable. IMPRESSION: No active disease. Electronically Signed   By: Randa Ngo M.D.   On: 07/02/2020 20:22    Procedures Procedures (including critical care  time) CRITICAL CARE Performed by: Fredia Sorrow Total critical care time: 60 minutes Critical care time was exclusive of separately billable procedures and treating other patients. Critical care was necessary to treat or prevent imminent or life-threatening deterioration. Critical care was time spent personally by me on the following activities: development of treatment plan with patient and/or surrogate as well as nursing, discussions with consultants, evaluation of patient's response to treatment, examination of patient, obtaining history from patient or surrogate, ordering and performing treatments and interventions, ordering and review of laboratory studies, ordering and review of radiographic studies, pulse oximetry and re-evaluation of patient's condition.   Medications Ordered in ED Medications  0.9 %  sodium chloride infusion ( Intravenous New Bag/Given 07/02/20 2009)  insulin regular, human (MYXREDLIN) 100 units/ 100 mL infusion (has no administration in time range)  lactated ringers infusion (has no administration in time range)  dextrose 5 % in lactated ringers infusion (has no administration in time range)  dextrose 50 % solution 0-50 mL (has no administration in time range)  sodium chloride 0.9 % bolus 500 mL (500 mLs Intravenous New Bag/Given 07/02/20 2009)  insulin aspart (novoLOG) injection 10 Units (10 Units Intravenous Given 07/02/20 2047)  diphenhydrAMINE (BENADRYL) injection 12.5 mg (12.5 mg Intravenous Given 07/02/20 2048)    ED Course  I have reviewed the triage vital signs and the nursing notes.  Pertinent labs & imaging results that were available during my care of the patient were reviewed by me and considered in my medical decision making (see chart for details).    MDM Rules/Calculators/A&P                         Patient clinically quite alert.  Mental status standpoint.  Blood sugar elevated in the 500s here.  Based on that patient ordered 10 units of  regular insulin 500 cc bolus of fluid and then 100 cc an hour.  While labs were pending.  Chest x-ray came back without any acute findings.  EKG was some changes.  But mostly will the tachycardia.  Patient denies any chest pain.  No abdominal pain no vomiting.  Patient's venous blood gas has significant pH of 7.2.  Room air sats are fine upper 90s to 100%.  Urinalysis raises some question of urinary tract infection urine culture sent.  Suspect a mixed picture here.  Not classically HHS.  Patient's mental status is pretty darn good.  Blood sugar being over 600 goes along with that.  BPH being is low as it is goes more along with DKA.  But further labs will help sort this out.  Insulin infusion ordered.  Patient's mental status being quite normal goes against HHS.  Blood sugar being over 600 goes along with it.  pH being very low goes more along with DKA.  Go ahead and order the hyperglycemia crisis panel.   Discussed with critical care.  They felt patient was adequate for stepdown admission even on the insulin drip.  Hospitalist has seen patient and feels comfortable admitting her.   Final Clinical Impression(s) / ED Diagnoses Final diagnoses:  Hyperglycemia  Dehydration    Rx / DC Orders ED Discharge Orders  None       Fredia Sorrow, MD 07/02/20 2246    Fredia Sorrow, MD 07/02/20 [phone]

## 2020-07-02 NOTE — ED Triage Notes (Signed)
BIBA from home, c/c weakness and confusion. Patient is currently AOx4 but lethargic. Had insulin change earlier this week and states her sugar has been trending upwards since.  500 cc bolus CBG 575 BP 160/92 P 78 RR 16 99% RA

## 2020-07-02 NOTE — ED Notes (Signed)
Date and time results received: 07/02/20 7:53 PM  Test: Glucose Critical Value: 610  Name of Provider Notified: Rogene Houston, EDP

## 2020-07-02 NOTE — H&P (Signed)
History and Physical    Hayley Fisher NOM:[id number] DOB: 09/21/1930 DOA: 07/02/2020  PCP: Danna Hefty, DO   Patient coming from:  home  Chief Complaint: elevated blood sugar, weakness, confusion.  HPI: Hayley Fisher is a 86 y.o. female with medical history significant for DMT2, HTN, OA presents by EMS for elevated blood sugar with generalized weakness and confusion per report. Pt is alert and able to answer questions at this time but is not a good historian. She reports that she is more thirsty than normal and has to urinate more frequently the past two days. She denies any burning or pain with urination.  She does not appear lethargic. Patient was seen by family medicine at [facility] who she is followed by on October 29.  They noted that her blood sugars at home were consistently high and her A1c on that day was 10.2.  Her medication of Metformin was to be continued at 1000 mg twice daily and they started Victoza 0.6 mg daily which was to be increased to 1.2 mg after 1 week.  Not clear whether patient did that increase or not as pt is not a good historian in terms of her medication and doses.  ED Course:  She was found to have a blood sugar over 600 and a pH of 7.2 on ABG. She was started on insulin in the ER and given IVF hydration. UA shows UTI and culture was sent to lab.   Review of Systems:  General: Denies weakness, fever, chills, weight loss, night sweats.  Denies dizziness.  Denies change in appetite HENT: Denies head trauma, headache, denies change in hearing, tinnitus.  Denies nasal congestion or bleeding.  Denies sore throat, sores in mouth.  Denies difficulty swallowing Eyes: Denies blurry vision, pain in eye, drainage.  Denies discoloration of eyes. Neck: Denies pain.  Denies swelling.  Denies pain with movement. Cardiovascular: Denies chest pain, palpitations.  Denies edema.  Denies orthopnea Respiratory: Denies shortness of breath, cough.  Denies wheezing.  Denies sputum  production Gastrointestinal: Denies abdominal pain, swelling.  Denies nausea, vomiting, diarrhea.  Denies melena.  Denies hematemesis. Musculoskeletal: Denies limitation of movement.  Denies deformity or swelling.  Denies pain.  Denies arthralgias or myalgias. Genitourinary: Denies pelvic pain.  Denies urinary frequency or hesitancy.  Denies dysuria.  Skin: Denies rash.  Denies petechiae, purpura, ecchymosis. Neurological: Denies headache.  Denies syncope.  Denies seizure activity.  Denies weakness or paresthesia.  Denies slurred speech, drooping face.  Denies visual change. Psychiatric: Denies depression, anxiety.  Denies suicidal thoughts or ideation.  Denies hallucinations.  Past Medical History:  Diagnosis Date  . Arthritis   . Arthritis   . Cataract    bil cateracts removed  . Chronic kidney disease    "spot on one of my kidneys" per pt  . Diabetes mellitus   . Glaucoma   . Hyperlipidemia   . Hyperplastic colon polyp   . Hypertension   . Syncope     Past Surgical History:  Procedure Laterality Date  . ABDOMINAL HYSTERECTOMY    . BLADDER SUSPENSION    . bladder tacking    . COLONOSCOPY    . EYE SURGERY    . INGUINAL HERNIA REPAIR Right     Social History  reports that she quit smoking about 39 years ago. Her smoking use included cigarettes. She has never used smokeless tobacco. She reports that she does not drink alcohol and does not use drugs.  Allergies  Allergen Reactions  .  Levemir [Insulin Detemir] Itching    Family History  Problem Relation Age of Onset  . Colon cancer Mother   . Other Father        cerebral hemorrhage  . Diabetes Brother   . Diabetes Sister        x 2  . Bone cancer Daughter   . Breast cancer Daughter 90  . Esophageal cancer Neg Hx   . Rectal cancer Neg Hx   . Stomach cancer Neg Hx      Prior to Admission medications   Medication Sig Start Date End Date Taking? Authorizing Provider  ACCU-CHEK AVIVA PLUS test strip USE TO TEST  BLOOD SUGAR UP TO 3 TIMES A DAY. 06/30/20   Mullis, Kiersten P, DO  Accu-Chek Softclix Lancets lancets USE TO TEST BLOOD SUGAR UP TO 3 TIMES A DAY. 06/30/20   Mullis, Kiersten P, DO  aspirin 81 MG chewable tablet Chew 81 mg by mouth every morning.    Yes [provider]  atorvastatin (LIPITOR) 40 MG tablet Take 1 tablet (40 mg total) by mouth daily. 07/24/19  Yes Milus Banister C, DO  brimonidine (ALPHAGAN P) 0.1 % SOLN Place 1 drop into the left eye 2 (two) times daily.  09/20/11  Yes   carbamide peroxide (DEBROX) 6.5 % OTIC solution Place 5 drops into both ears 2 (two) times daily. Patient taking differently: Place 2 drops into both ears 2 (two) times daily.  10/16/18  Yes Alveda Reasons, MD  dorzolamide-timolol (COSOPT) 22.3-6.8 MG/ML ophthalmic solution Place 1 drop into the left eye 2 (two) times daily. 05/18/20  Yes [provider]  DULoxetine (CYMBALTA) 60 MG capsule TAKE 1 CAPSULE BY MOUTH EVERY DAY Patient taking differently: Take 60 mg by mouth daily.  10/22/18  Yes Harriet Butte, DO  enalapril (VASOTEC) 20 MG tablet TAKE 1 TABLET BY MOUTH EVERY DAY Patient taking differently: Take 20 mg by mouth daily.  06/17/20  Yes Mullis, Kiersten P, DO  liraglutide (VICTOZA) 18 MG/3ML SOPN Inject 0.6 mg into the skin daily. 0.6 mg once daily for 1 week,then increase to 1.2 mg once daily. Patient taking differently: Inject 0.6 mg into the skin daily.  06/21/20  Yes Mullis, Kiersten P, DO  metFORMIN (GLUCOPHAGE-XR) 500 MG 24 hr tablet TAKE 2 TABLETS BY MOUTH 2 TIMES DAILY Patient taking differently: Take 1,000 mg by mouth in the morning and at bedtime.  03/26/20  Yes Mullis, Kiersten P, DO  VYZULTA 0.024 % SOLN Place 1 drop into the right eye daily. 11/06/17  Yes [provider]  albuterol (PROVENTIL HFA;VENTOLIN HFA) 108 (90 Base) MCG/ACT inhaler Inhale 2 puffs into the lungs every 6 (six) hours as needed for wheezing or shortness of breath. Patient not taking: Reported on  07/02/2020 10/16/18   Alveda Reasons, MD  hydrocortisone 2.5 % cream Apply 1 application rectally 2 times daily Patient not taking: Reported on 07/02/2020 10/16/18   Alveda Reasons, MD  mupirocin ointment (BACTROBAN) 2 % Place 1 application into the nose as needed. Patient not taking: Reported on 07/02/2020 03/19/19   Mina Marble P, DO  Accu-Chek Softclix Lancets lancets USE TO TEST BLOOD SUGAR UP  TO 3 TIMES A DAY. 05/17/19   Mullis, Kiersten P, DO  glucose blood (ACCU-CHEK AVIVA PLUS) test strip USE TO TEST BLOOD SUGAR UP  TO 3 TIMES A DAY. 05/17/19   Danna Hefty, DO    Physical Exam: Vitals:   07/02/20 2130 07/02/20 2200 07/02/20 2230 07/02/20  2300  BP: 129/74 128/89 114/72 115/68  Pulse: (!) 102     Resp: 15 17 (!) 27 (!) 26  Temp:      TempSrc:      SpO2: 94%       Constitutional: NAD, calm, comfortable Vitals:   07/02/20 2130 07/02/20 2200 07/02/20 2230 07/02/20 2300  BP: 129/74 128/89 114/72 115/68  Pulse: (!) 102     Resp: 15 17 (!) 27 (!) 26  Temp:      TempSrc:      SpO2: 94%      General: WDWN, Alert and oriented x2.  Eyes: EOMI, PERRL, lids and conjunctivae normal.  Sclera nonicteric HENT:  [place]/AT, external ears normal.  Nares patent without epistasis.  Mucous membranes are dry. Posterior pharynx clear of any exudate or lesions.  Neck: Soft, normal range of motion, supple, no masses, no thyromegaly.  Trachea midline Respiratory: clear to auscultation bilaterally, no wheezing, no crackles. Normal respiratory effort. No accessory muscle use.  Cardiovascular: Regular rate and rhythm, no murmurs / rubs / gallops. Mild lower extremity edema. 1+ pedal pulses.  Abdomen: Soft, no tenderness, nondistended, no rebound or guarding.  No masses palpated. No hepatosplenomegaly. Bowel sounds normoactive Musculoskeletal: FROM. no clubbing / cyanosis. Normal muscle tone.  Skin: Warm, dry, intact no rashes, lesions, ulcers. No induration Neurologic: CN 2-12 grossly  intact.  Normal speech.  Sensation intact, patella DTR +1 bilaterally. Strength 4/5 in all extremities.   Psychiatric: Normal mood and affect.    Labs on Admission: I have personally reviewed following labs and imaging studies  CBC: Recent Labs  Lab 07/02/20 1843  WBC 4.2  HGB 12.5  HCT 40.2  MCV 100.2*  PLT 585    Basic Metabolic Panel: Recent Labs  Lab 07/02/20 1843  NA 133*  K 6.0*  CL 98  CO2 13*  GLUCOSE 610*  BUN 31*  CREATININE 1.14*  CALCIUM 9.1    GFR: Estimated Creatinine Clearance: 30 mL/min (A) (by C-G formula based on SCr of 1.14 mg/dL (H)).  Liver Function Tests: No results for input(s): AST, ALT, ALKPHOS, BILITOT, PROT, ALBUMIN in the last 168 hours.  Urine analysis:    Component Value Date/Time   COLORURINE YELLOW 07/02/2020 1945   APPEARANCEUR HAZY (A) 07/02/2020 1945   LABSPEC 1.021 07/02/2020 1945   PHURINE 5.0 07/02/2020 1945   GLUCOSEU >=500 (A) 07/02/2020 1945   HGBUR NEGATIVE 07/02/2020 1945   HGBUR trace-intact 07/20/2010 1433   BILIRUBINUR NEGATIVE 07/02/2020 1945   BILIRUBINUR negative 06/19/2020 1630   BILIRUBINUR NEG 08/09/2016 1500   KETONESUR 80 (A) 07/02/2020 1945   PROTEINUR NEGATIVE 07/02/2020 1945   UROBILINOGEN 0.2 06/19/2020 1630   UROBILINOGEN 0.2 10/18/2014 1624   NITRITE NEGATIVE 07/02/2020 1945   LEUKOCYTESUR MODERATE (A) 07/02/2020 1945    Radiological Exams on Admission: DG Chest Port 1 View  Result Date: 07/02/2020 CLINICAL DATA:  Hyperglycemia EXAM: PORTABLE CHEST 1 VIEW COMPARISON:  07/09/2018 FINDINGS: The heart size and mediastinal contours are within normal limits. Both lungs are clear. The visualized skeletal structures are unremarkable. IMPRESSION: No active disease. Electronically Signed   By: Randa Ngo M.D.   On: 07/02/2020 20:22    EKG: Independently reviewed. NSR. Nonspecific ST changes. No acute ST elevation or depression. QTc is 447.   Assessment/Plan Principal Problem:   Uncontrolled  type 2 diabetes mellitus with hyperglycemia ([place]) Hayley Fisher is admitted to ICU.  She is started on insulin infusion in ER which will be continued  until blood sugars controlled.  Patient on IV fluids with LR.  Will change to D5 LR with 20 mEq of potassium once blood sugars below 250.  When blood sugars below 250 will wean insulin infusion per protocol Kept n.p.o. except for sips of water until blood sugar below 250.  Active Problems:   Metabolic acidosis We will monitor with above therapies.    Hyperkalemia Dissipate potassium level will improve with insulin treatment.  We will check every 6 hour BMPs.    HYPERTENSION, BENIGN SYSTEMIC Continue home dose of enalapril.  Monitor blood pressure.    UTI (urinary tract infection) Started on Rocephin.  Urine culture obtained and will be monitored.    DVT prophylaxis: Lovenox for DVT prophylaxis.  Code Status:   Full code  Family Communication:  Diagnosis and plan discussed with patient.  Patient verbales understanding and agrees with plan.  Further recommendations to follow as clinically indicated Disposition Plan:   Patient is from:  Home  Anticipated DC to:  Home  Anticipated DC date:  Anticipate greater than 2 midnight stay in the hospital to treat acute medical condition  Anticipated DC barriers: No barriers to discharge identified at this time  Admission status:  Inpatient   Yevonne Aline Denham Mose MD [facility]  How to contact the [facility] Attending or Consulting provider [place] or covering provider during after hours [place], for this patient?   1. Check the care team in [facility] and look for a) attending/consulting TR[facility] provider listed and b) the [facility] team listed 2. Log into [email or web address] and use [place]'s universal password to access. If you do not have the password, please contact the hospital operator. 3. Locate the [facility] provider you are looking for under [facility] and page to a number that you can be directly  reached. 4. If you still have difficulty reaching the provider, please page the [facility] (Director on Call) for the Hospitalists listed on amion for assistance.  07/02/2020, 11:30 PM

## 2020-07-02 NOTE — ED Notes (Signed)
Pt ambulated with walker to restroom and stated her legs felt "tired in the back."

## 2020-07-02 NOTE — ED Notes (Signed)
Date and time results received: 07/02/20 2035 (use smartphrase ".now" to insert current time)  Test: PO2  (VBG)  Critical Value: 31.9  Name of Provider Notified:Dr. Farris Has  Orders Received? Or Actions Taken?: N/A

## 2020-07-02 NOTE — ED Notes (Signed)
Pt report given to Rutherford Nail, RN.  SBAR information covered at this time.  NO additional questions were asked at this time.  Pt resting quietly in bed.  NADN.  Will continue to monitor.

## 2020-07-02 NOTE — ED Notes (Signed)
PT to Rescus A at this time.  Pt reports having her diabetes meds changed 4 days ago and that her glucose has been running high ever since.  Pt A&O x 4 at this time.  NADN.  Will continue to monitor.

## 2020-07-03 DIAGNOSIS — E1165 Type 2 diabetes mellitus with hyperglycemia: Secondary | ICD-10-CM

## 2020-07-03 LAB — GLUCOSE, CAPILLARY
Glucose-Capillary: 102 mg/dL — ABNORMAL HIGH (ref 70–99)
Glucose-Capillary: 116 mg/dL — ABNORMAL HIGH (ref 70–99)
Glucose-Capillary: 133 mg/dL — ABNORMAL HIGH (ref 70–99)
Glucose-Capillary: 159 mg/dL — ABNORMAL HIGH (ref 70–99)
Glucose-Capillary: 167 mg/dL — ABNORMAL HIGH (ref 70–99)
Glucose-Capillary: 191 mg/dL — ABNORMAL HIGH (ref 70–99)
Glucose-Capillary: 201 mg/dL — ABNORMAL HIGH (ref 70–99)
Glucose-Capillary: 202 mg/dL — ABNORMAL HIGH (ref 70–99)
Glucose-Capillary: 205 mg/dL — ABNORMAL HIGH (ref 70–99)
Glucose-Capillary: 211 mg/dL — ABNORMAL HIGH (ref 70–99)
Glucose-Capillary: 226 mg/dL — ABNORMAL HIGH (ref 70–99)
Glucose-Capillary: 240 mg/dL — ABNORMAL HIGH (ref 70–99)
Glucose-Capillary: 246 mg/dL — ABNORMAL HIGH (ref 70–99)
Glucose-Capillary: 289 mg/dL — ABNORMAL HIGH (ref 70–99)
Glucose-Capillary: 78 mg/dL (ref 70–99)
Glucose-Capillary: 97 mg/dL (ref 70–99)

## 2020-07-03 LAB — BASIC METABOLIC PANEL
Anion gap: 10 (ref 5–15)
Anion gap: 8 (ref 5–15)
BUN: 27 mg/dL — ABNORMAL HIGH (ref 8–23)
BUN: 27 mg/dL — ABNORMAL HIGH (ref 8–23)
CO2: 19 mmol/L — ABNORMAL LOW (ref 22–32)
CO2: 22 mmol/L (ref 22–32)
Calcium: 8.6 mg/dL — ABNORMAL LOW (ref 8.9–10.3)
Calcium: 8.8 mg/dL — ABNORMAL LOW (ref 8.9–10.3)
Chloride: 106 mmol/L (ref 98–111)
Chloride: 108 mmol/L (ref 98–111)
Creatinine, Ser: 0.77 mg/dL (ref 0.44–1.00)
Creatinine, Ser: 0.76 mg/dL (ref 0.44–1.00)
GFR, Estimated: >60 mL/min (ref >60)
GFR, Estimated: >60 mL/min (ref >60)
Glucose, Bld: 220 mg/dL — ABNORMAL HIGH (ref 70–99)
Glucose, Bld: 120 mg/dL — ABNORMAL HIGH (ref 70–99)
Potassium: 4.7 mmol/L (ref 3.5–5.1)
Potassium: 4.6 mmol/L (ref 3.5–5.1)
Sodium: 135 mmol/L (ref 135–145)
Sodium: 138 mmol/L (ref 135–145)

## 2020-07-03 LAB — URINE CULTURE: Culture: <10000 — AB

## 2020-07-03 LAB — OSMOLALITY: Osmolality: 315 mOsm/kg — ABNORMAL HIGH (ref 275–295)

## 2020-07-03 LAB — CBC
HCT: 34.3 % — ABNORMAL LOW (ref 36.0–46.0)
Hemoglobin: 11 g/dL — ABNORMAL LOW (ref 12.0–15.0)
MCH: 31.1 pg (ref 26.0–34.0)
MCHC: 32.1 g/dL (ref 30.0–36.0)
MCV: 96.9 fL (ref 80.0–100.0)
Platelets: 204 10*3/uL (ref 150–400)
RBC: 3.54 MIL/uL — ABNORMAL LOW (ref 3.87–5.11)
RDW: 13.6 % (ref 11.5–15.5)
WBC: 4 10*3/uL (ref 4.0–10.5)
nRBC: 0 % (ref 0.0–0.2)

## 2020-07-03 LAB — RESPIRATORY PANEL BY RT PCR (FLU A&B, COVID)
Influenza A by PCR: NEGATIVE
Influenza B by PCR: NEGATIVE
SARS Coronavirus 2 by RT PCR: NEGATIVE

## 2020-07-03 LAB — TROPONIN I (HIGH SENSITIVITY)
Troponin I (High Sensitivity): 3 ng/L (ref <18)
Troponin I (High Sensitivity): 4 ng/L (ref <18)
Troponin I (High Sensitivity): 4 ng/L (ref <18)

## 2020-07-03 LAB — BETA-HYDROXYBUTYRIC ACID: Beta-Hydroxybutyric Acid: 3.79 mmol/L — ABNORMAL HIGH (ref 0.05–0.27)

## 2020-07-03 LAB — MRSA PCR SCREENING: MRSA by PCR: NEGATIVE

## 2020-07-03 MED ORDER — KCL-LACTATED RINGERS-D5W 20 MEQ/L IV SOLN
INTRAVENOUS | Status: DC
  Filled 2020-07-03 (×2): qty 1000

## 2020-07-03 MED ORDER — BRIMONIDINE TARTRATE 0.15 % OP SOLN
1.0000 [drp] | Freq: Two times a day (BID) | OPHTHALMIC | Status: DC
  Administered 2020-07-03 – 2020-07-06 (×7): 1 [drp] via OPHTHALMIC
  Filled 2020-07-03: qty 5

## 2020-07-03 MED ORDER — LACTATED RINGERS IV SOLN: INTRAVENOUS | Status: DC

## 2020-07-03 MED ORDER — LATANOPROST 0.005 % OP SOLN
1.0000 [drp] | Freq: Every day | OPHTHALMIC | Status: DC
  Administered 2020-07-03 – 2020-07-05 (×3): 1 [drp] via OPHTHALMIC
  Filled 2020-07-03: qty 2.5

## 2020-07-03 MED ORDER — DORZOLAMIDE HCL-TIMOLOL MAL 2-0.5 % OP SOLN
1.0000 [drp] | Freq: Two times a day (BID) | OPHTHALMIC | Status: DC
  Administered 2020-07-03 – 2020-07-06 (×7): 1 [drp] via OPHTHALMIC
  Filled 2020-07-03: qty 10

## 2020-07-03 MED ORDER — INSULIN ASPART 100 UNIT/ML ~~LOC~~ SOLN
0.0000 [IU] | Freq: Three times a day (TID) | SUBCUTANEOUS | Status: DC
  Administered 2020-07-03: 3 [IU] via SUBCUTANEOUS
  Administered 2020-07-03: 0 [IU] via SUBCUTANEOUS
  Administered 2020-07-04 (×2): 5 [IU] via SUBCUTANEOUS
  Administered 2020-07-04: 7 [IU] via SUBCUTANEOUS
  Administered 2020-07-05: 3 [IU] via SUBCUTANEOUS
  Administered 2020-07-05: 2 [IU] via SUBCUTANEOUS
  Administered 2020-07-05: 9 [IU] via SUBCUTANEOUS
  Administered 2020-07-06: 3 [IU] via SUBCUTANEOUS
  Administered 2020-07-06: 1 [IU] via SUBCUTANEOUS
  Administered 2020-07-06: 5 [IU] via SUBCUTANEOUS

## 2020-07-03 MED ORDER — ENOXAPARIN SODIUM 40 MG/0.4ML ~~LOC~~ SOLN
40.0000 mg | SUBCUTANEOUS | Status: DC
  Administered 2020-07-03 – 2020-07-06 (×4): 40 mg via SUBCUTANEOUS
  Filled 2020-07-03 (×4): qty 0.4

## 2020-07-03 MED ORDER — ORAL CARE MOUTH RINSE
15.0000 mL | Freq: Two times a day (BID) | OROMUCOSAL | Status: DC
  Administered 2020-07-03 – 2020-07-06 (×8): 15 mL via OROMUCOSAL

## 2020-07-03 MED ORDER — DULOXETINE HCL 60 MG PO CPEP
60.0000 mg | ORAL_CAPSULE | Freq: Every day | ORAL | Status: DC
  Administered 2020-07-03 – 2020-07-06 (×4): 60 mg via ORAL
  Filled 2020-07-03: qty 2
  Filled 2020-07-03: qty 1
  Filled 2020-07-03: qty 2
  Filled 2020-07-03: qty 1

## 2020-07-03 MED ORDER — ENALAPRIL MALEATE 10 MG PO TABS
20.0000 mg | ORAL_TABLET | Freq: Every day | ORAL | Status: DC
  Administered 2020-07-03: 20 mg via ORAL
  Filled 2020-07-03 (×4): qty 2

## 2020-07-03 MED ORDER — INSULIN ASPART 100 UNIT/ML ~~LOC~~ SOLN
0.0000 [IU] | Freq: Every day | SUBCUTANEOUS | Status: DC
  Administered 2020-07-03: 2 [IU] via SUBCUTANEOUS
  Administered 2020-07-05: 4 [IU] via SUBCUTANEOUS

## 2020-07-03 MED ORDER — INSULIN GLARGINE 100 UNIT/ML ~~LOC~~ SOLN
10.0000 [IU] | SUBCUTANEOUS | Status: DC
  Administered 2020-07-03: 10 [IU] via SUBCUTANEOUS
  Filled 2020-07-03 (×2): qty 0.1

## 2020-07-03 MED ORDER — ATORVASTATIN CALCIUM 40 MG PO TABS
40.0000 mg | ORAL_TABLET | Freq: Every day | ORAL | Status: DC
  Administered 2020-07-03 – 2020-07-06 (×4): 40 mg via ORAL
  Filled 2020-07-03 (×4): qty 1

## 2020-07-03 MED ORDER — ASPIRIN 81 MG PO CHEW
81.0000 mg | CHEWABLE_TABLET | Freq: Every morning | ORAL | Status: DC
  Administered 2020-07-03 – 2020-07-06 (×4): 81 mg via ORAL
  Filled 2020-07-03 (×4): qty 1

## 2020-07-03 MED ORDER — SODIUM CHLORIDE 0.9 % IV SOLN
1.0000 g | INTRAVENOUS | Status: DC
  Administered 2020-07-03 – 2020-07-04 (×2): 1 g via INTRAVENOUS
  Filled 2020-07-03: qty 10
  Filled 2020-07-03: qty 1

## 2020-07-03 MED ORDER — CHLORHEXIDINE GLUCONATE CLOTH 2 % EX PADS
6.0000 | MEDICATED_PAD | Freq: Every day | CUTANEOUS | Status: DC
  Administered 2020-07-03 – 2020-07-05 (×3): 6 via TOPICAL

## 2020-07-03 MED ORDER — LATANOPROSTENE BUNOD 0.024 % OP SOLN: 1.0000 [drp] | Freq: Every day | OPHTHALMIC | Status: DC

## 2020-07-03 MED ORDER — DEXTROSE 50 % IV SOLN: 0.0000 mL | INTRAVENOUS | Status: DC | PRN

## 2020-07-03 NOTE — Progress Notes (Signed)
During OT therapist requested assistance with getting patient back into bed due to patient being unresponsive. Patient was unresponsive to sternal rub. Patients pulse could not be palpated. BP was checked and responsiveness returned. Patient return to baseline.  Contacted physician and transfer orders were dc'ed. Will continue monitoring throughout the night.

## 2020-07-03 NOTE — Progress Notes (Signed)
PT Cancellation Note  Patient Details Name: Hayley Fisher MRN: [id number] DOB: August 30, 1930   Cancelled Treatment:    Reason Eval/Treat Not Completed: Other (comment) (RN requests hold until tomorrow. pt experienced episode where she lost conciousness while up on [facility] today. Will follow up at later date/time as schedule allows and pt able.)   Gwynneth Albright PT, [place]  Office [phone] Pager [phone]  07/03/2020 4:56 PM

## 2020-07-03 NOTE — Progress Notes (Signed)
Inpatient Diabetes Program Recommendations  AACE/ADA: New Consensus Statement on Inpatient Glycemic Control (2015)  Target Ranges:  Prepandial:   less than 140 mg/dL      Peak postprandial:   less than 180 mg/dL (1-2 hours)      Critically ill patients:  140 - 180 mg/dL   Lab Results  Component Value Date   GLUCAP 201 (H) 07/03/2020   HGBA1C 10.2 (A) 06/19/2020    Review of Glycemic Control  Diabetes history: DM2 Outpatient Diabetes medications: Victoza 0.6 mg QD, metformin 1000 mg bid Current orders for Inpatient glycemic control: Lantus 10 units QD, Novolog 0-9 units tidwc and hs  HgbA1C - 10.2%  Inpatient Diabetes Program Recommendations:  Agree with orders.  Pt may do well with checking blood sugars 2-3x/day and taking metformin 1000 mg bid regularly each day.  Would not restart SGLT2 d/t risk for DKA.  Consider glimepiride 1-2 mg QD  May need low dose Lantus if FBS is consistently > 180 mg/dL.  Will need to f/u with PCP and take blood sugar log to appt for MD to review.  Continue to follow.  Thank you. Lorenda Peck, RD, LDN, CDE Inpatient Diabetes Coordinator [phone]

## 2020-07-03 NOTE — Evaluation (Signed)
Occupational Therapy Evaluation Patient Details Name: Dianely Krehbiel MRN: [id number] DOB: 1931-01-14 Today's Date: 07/03/2020    History of Present Illness 86 year old female with history of type 2 diabetes on Victoza and Metformin, hypertension, dementia who lives at home with her sister brought to ER with very high blood sugar.   Clinical Impression   Ms. Shyanne Mcclary is an 86 year old woman who typically lives at home with her sister and per reports uses a RW and able to perform BADLs - though reports could be unreliable. Family reports an aide comes daily. On evaluation patient demonstrates generalized weakness, poor activity tolerance, and poor balance resulting in a significant decline in functional abilities. Patient max assist to transfer to side of bed, exhibits poor balance and using UEs to prop her self. BP taken at edge of bed and found to be 108/75. Patient total assist to don socks. Patient reporting she needs to use the bathroom. Patient mod assist to stand and max assist to transfer to [facility] with RW. Patient became unresponsive on [facility] and requiring total assist for toileting and quick return to bed with assistance of nursing. HR noted to drop into 70s during unresponsiveness and BP 146/88 in supine. Patient will benefit from skilled OT services while in hospital to improve deficits and learn compensatory strategies as needed in order to return to PLOF.  At this time therapist has to recommend short term rehab. If patient improves significantly will update plan of care.     Follow Up Recommendations  SNF    Equipment Recommendations  None recommended by OT    Recommendations for Other Services       Precautions / Restrictions Precautions Precautions: Fall Restrictions Weight Bearing Restrictions: No      Mobility Bed Mobility Overal bed mobility: Needs Assistance Bed Mobility: Supine to Sit     Supine to sit: Max assist;HOB elevated     General bed mobility  comments: Patient attempting to assist with transfer using bed rails - needing assistance for trunk lift off and scooting to edge.    Transfers Overall transfer level: Needs assistance Equipment used: Rolling walker (2 wheeled) Transfers: Sit to/from [facility] Sit to Stand: Mod assist Stand pivot transfers: Max assist       General transfer comment: Mod assist to stand from bed with RW. Max assist to perform stand pivot to BSc - needed assistance to manage walker and physical assist to complete pivot. Poor posture over walker.    Balance Overall balance assessment: Needs assistance Sitting-balance support: Bilateral upper extremity supported;Feet supported Sitting balance-Leahy Scale: Poor     Standing balance support: During functional activity;Bilateral upper extremity supported Standing balance-Leahy Scale: Poor                             ADL either performed or assessed with clinical judgement   ADL Overall ADL's : Needs assistance/impaired Eating/Feeding: Set up;Sitting   Grooming: Set up;Sitting   Upper Body Bathing: Set up;Minimal assistance;Sitting   Lower Body Bathing: Maximal assistance;Sitting/lateral leans   Upper Body Dressing : Minimal assistance;Set up   Lower Body Dressing: Total assistance;Bed level   Toilet Transfer: Maximal assistance;Stand-pivot;RW;BSC;Total assistance Toilet Transfer Details (indicate cue type and reason): use of RW to stand pivot to [facility]. Patient required max assist to complete pivot and manage RW. Total assist to transfer back to bed after becoming unresponsive. Toileting- Clothing Manipulation and Hygiene: Total assistance Toileting - Clothing  Manipulation Details (indicate cue type and reason): Patient became unresponsive on BSC. Rn in room to assist. Total assist for pericare.             Vision   Vision Assessment?: No apparent visual deficits     Perception     Praxis      Pertinent  Vitals/Pain Pain Assessment: No/denies pain     Hand Dominance     Extremity/Trunk Assessment Upper Extremity Assessment Upper Extremity Assessment: Generalized weakness   Lower Extremity Assessment Lower Extremity Assessment: Defer to PT evaluation   Cervical / Trunk Assessment Cervical / Trunk Assessment: Kyphotic   Communication Communication Communication: No difficulties   Cognition Arousal/Alertness: Awake/alert Behavior During Therapy: WFL for tasks assessed/performed Overall Cognitive Status: No family/caregiver present to determine baseline cognitive functioning                                 General Comments: Initially alert and able to follow commands. Slow processing time.   General Comments       Exercises     Shoulder Instructions      Home Living Family/patient expects to be discharged to:: Skilled nursing facility Living Arrangements: Other relatives (sister)                           Home Equipment: Gilford Rile - 2 wheels          Prior Functioning/Environment Level of Independence: Independent with assistive device(s)        Comments: Per Rn report and chart patient lives at home with her sister. Patient states she uses a rw and independent with BADLs. Rn reports patient has an aide that comes daily.        OT Problem List: Decreased strength;Decreased activity tolerance;Impaired balance (sitting and/or standing);Decreased safety awareness;Decreased cognition;Decreased knowledge of use of DME or AE      OT Treatment/Interventions: Self-care/ADL training;Therapeutic exercise;DME and/or AE instruction;Therapeutic activities;Patient/family education;Balance training    OT Goals(Current goals can be found in the care plan section) Acute Rehab OT Goals OT Goal Formulation: Patient unable to participate in goal setting Time For Goal Achievement: 07/17/20 Potential to Achieve Goals: Good  OT Frequency: Min 2X/week    Barriers to D/C:            Co-evaluation              AM-PAC OT "6 Clicks" Daily Activity     Outcome Measure Help from another person eating meals?: A Little Help from another person taking care of personal grooming?: A Little Help from another person toileting, which includes using toliet, bedpan, or urinal?: Total Help from another person bathing (including washing, rinsing, drying)?: A Lot Help from another person to put on and taking off regular upper body clothing?: A Little Help from another person to put on and taking off regular lower body clothing?: Total 6 Click Score: 13   End of Session Equipment Utilized During Treatment: Rolling walker Nurse Communication: Mobility status  Activity Tolerance: Patient limited by fatigue (became unresponsive) Patient left:    OT Visit Diagnosis: Unsteadiness on feet (R26.81);Muscle weakness (generalized) (M62.81)                Time: 3500-9381 OT Time Calculation (min): 32 min Charges:  OT General Charges $OT Visit: 1 Visit OT Evaluation $OT Eval Moderate Complexity: 1 Mod  Delrose Rohwer, OTR/L Acute Care  Rehab Services  Office [phone] Pager: [place] 07/03/2020, 4:03 PM

## 2020-07-03 NOTE — TOC Initial Note (Signed)
Transition of Care [facility]) - Initial/Assessment Note    Patient Details  Name: Hayley Fisher MRN: [id number] Date of Birth: 09-08-1930  Transition of Care [facility] CM/SW Contact:    Leeroy Cha, RN Phone Number: 07/03/2020, 8:53 AM  Clinical Narrative:                 86 y.o. female with medical history significant for DMT2, HTN, OA presents by EMS for elevated blood sugar with generalized weakness and confusion per report. Pt is alert and able to answer questions at this time but is not a good historian. She reports that she is more thirsty than normal and has to urinate more frequently the past two days. She denies any burning or pain with urination. She does not appear lethargic. Patient was seen by family medicine at [facility] who she is followed by on October 29. They noted that her blood sugars at home were consistently high and her A1c on that day was 10.2. Her medication of Metformin was to be continued at 1000 mg twice daily and they started Victoza 0.6 mg daily which was to be increased to 1.2 mg after 1 week. Not clear whether patient did that increase or not as pt is not a good historian in terms of her medication and doses.  ED Course:  She was found to have a blood sugar over 600 and a pH of 7.2 on ABG. She was started on insulin in the ER and given IVF hydration. UA shows UTI and culture was sent to lab.  plan is to return to home lives alone but does have family in the area Following for progression.  Expected Discharge Plan: Home/Self Care Barriers to Discharge: No Barriers Identified   Patient Goals and CMS Choice Patient states their goals for this hospitalization and ongoing recovery are:: to go home CMS Medicare.gov Compare Post Acute Care list provided to:: Patient    Expected Discharge Plan and Services Expected Discharge Plan: Home/Self Care   Discharge Planning Services: CM Consult   Living arrangements for the past 2 months: Single Family Home                                       Prior Living Arrangements/Services Living arrangements for the past 2 months: Single Family Home Lives with:: Self Patient language and need for interpreter reviewed:: Yes Do you feel safe going back to the place where you live?: Yes      Need for Family Participation in Patient Care: Yes (Comment) Care giver support system in place?: Yes (comment)   Criminal Activity/Legal Involvement Pertinent to Current Situation/Hospitalization: No - Comment as needed  Activities of Daily Living      Permission Sought/Granted                  Emotional Assessment Appearance:: Appears stated age Attitude/Demeanor/Rapport: Engaged Affect (typically observed): Calm Orientation: : Oriented to Place, Oriented to Self, Oriented to  Time, Oriented to Situation Alcohol / Substance Use: Not Applicable Psych Involvement: No (comment)  Admission diagnosis:  Dehydration [E86.0] Hyperglycemia [R73.9] Type 2 diabetes mellitus with hyperosmolar hyperglycemic state (HHS) ([place]) [E11.00, E11.65] Patient Active Problem List   Diagnosis Date Noted  . Uncontrolled type 2 diabetes mellitus with hyperglycemia ([place]) 07/02/2020  . Metabolic acidosis 35/00/9381  . UTI (urinary tract infection) 07/02/2020  . Type 2 diabetes mellitus with hyperosmolar hyperglycemic state (HHS) ([place])  07/02/2020  . Hyperkalemia 07/02/2020  . Vaginal candidiasis 06/21/2020  . Atrophic vaginitis 06/21/2020  . Skin ulcer of sacrum, limited to breakdown of skin ([place]) 03/16/2017  . Chronic venous insufficiency 11/09/2015  . DDD (degenerative disc disease), lumbosacral 11/24/2014  . Type 2 diabetes mellitus with diabetic neuropathy ([place]) 10/19/2006  . Hyperlipidemia associated with type 2 diabetes mellitus ([place]) 10/19/2006  . HYPERTENSION, BENIGN SYSTEMIC 10/19/2006  . Osteopenia 10/19/2006   PCP:  Danna Hefty, DO Pharmacy:   [facility] [place], [place] - [address]  [address] [place] [address] Phone: [phone] Fax: [phone]     Social Determinants of Health (SDOH) Interventions    Readmission Risk Interventions No flowsheet data found.

## 2020-07-03 NOTE — Progress Notes (Signed)
PROGRESS NOTE    Hayley Fisher  YTK:[id number] DOB: [birth year] DOA: 07/02/2020 PCP: Danna Hefty, DO    Brief Narrative:  86 year old female with history of type 2 diabetes on Victoza and Metformin, hypertension, dementia who lives at home with her sister brought to ER with very high blood sugar.  Patient is poor historian.  She was noted to be urinating more frequently for past 2 days.  Her blood sugars were consistently high, unsure who called the ambulance. In the emergency room, hemodynamically stable.  Blood sugars more than 600 with pH 7.2 and anion gap more than 20.  Urinalysis abnormal.  Assessment & Plan:   Principal Problem:   Uncontrolled type 2 diabetes mellitus with hyperglycemia (HCC) Active Problems:   HYPERTENSION, BENIGN SYSTEMIC   Metabolic acidosis   UTI (urinary tract infection)   Hyperkalemia  Diabetic ketoacidosis with underlying history of type 2 diabetes, uncontrolled: Uncontrolled blood sugars, probably acidosis precipitated by concomitant UTI. Unsure medication intake at home, she has progressive dementia and apparently takes her Victoza herself. Admitted and treated with IV fluid, electrolyte replacement, IV insulin with clinical and biochemical improvement.  Anion gap closed. Change to subcu insulin, low-dose Lantus and sliding scale insulin.  Patient likely has very low insulin requirement.  A1c 10. Recently increased dose of Victoza to 1.2 mg/day and on Metformin 1000 mg twice a day.  This should be sufficient to control her blood sugars. Discussed with family members, they will have more supervision about medicine intake at home.  Essential hypertension: Blood pressure is stable.  All home medications resumed including enalapril.  Acute UTI present on admission: On Rocephin.  Continue until final cultures.  Dementia without behavioral disturbances: Lives at home with family.  Has adequate support.  Will have PT OT evaluation today.  Will need  evaluation for safe discharge home.  Called and discussed with patient's daughter on the phone and updated. Patient is clinically stabilized.  She can be transferred to [place] bed.   DVT prophylaxis: enoxaparin (LOVENOX) injection 40 mg Start: 07/03/20 1000   Code Status: Full code Family Communication: Daughter on the phone Disposition Plan: Status is: Inpatient  Remains inpatient appropriate because:Inpatient level of care appropriate due to severity of illness   Dispo: The patient is from: Home              Anticipated d/c is to: Home              Anticipated d/c date is: 2 days              Patient currently is not medically stable to d/c.         Consultants:   None  Procedures:   None  Antimicrobials:   Rocephin, 11/11---   Subjective: Patient seen and examined.  Pleasant and conversant but mostly forgetful.  No other overnight events.  Blood sugars noted to be less than 100.  Anion gap closed.  Patient herself denies any nausea vomiting.  She is hungry.  Objective: Vitals:   07/03/20 0320 07/03/20 0400 07/03/20 0500 07/03/20 0800  BP:  (!) 89/47 (!) 114/57   Pulse:      Resp:  15 16   Temp: (!) 97.5 F (36.4 C)   97.9 F (36.6 C)  TempSrc: Oral   Oral  SpO2:  97% 98%     Intake/Output Summary (Last 24 hours) at 07/03/2020 1131 Last data filed at 07/03/2020 0800 Gross per 24 hour  Intake 850.63 ml  Output 150 ml  Net 700.63 ml   There were no vitals filed for this visit.  Examination:  General exam: Appears calm and comfortable, not in any distress. Pleasantly confused and unable to recall most of the events. Respiratory system: Clear to auscultation. Respiratory effort normal. Cardiovascular system: S1 & S2 heard, RRR. No JVD, murmurs, rubs, gallops or clicks. No pedal edema. Gastrointestinal system: Abdomen is nondistended, soft and nontender. No organomegaly or masses felt. Normal bowel sounds heard. Central nervous system: Alert and  oriented x1. Extremities: Symmetric 5 x 5 power. Skin: No rashes, lesions or ulcers Psychiatry: Judgement and insight appear compromised.Mood & affect appropriate.     Data Reviewed: I have personally reviewed following labs and imaging studies  CBC: Recent Labs  Lab 07/02/20 1843 07/03/20 0255  WBC 4.2 4.0  HGB 12.5 11.0*  HCT 40.2 34.3*  MCV 100.2* 96.9  PLT 235 810   Basic Metabolic Panel: Recent Labs  Lab 07/02/20 1843 07/03/20 0255 07/03/20 0635  NA 133* 135 138  K 6.0* 4.7 4.6  CL 98 106 108  CO2 13* 19* 22  GLUCOSE 610* 220* 120*  BUN 31* 27* 27*  CREATININE 1.14* 0.77 0.76  CALCIUM 9.1 8.6* 8.8*   GFR: Estimated Creatinine Clearance: 42.8 mL/min (by C-G formula based on SCr of 0.76 mg/dL). Liver Function Tests: No results for input(s): AST, ALT, ALKPHOS, BILITOT, PROT, ALBUMIN in the last 168 hours. No results for input(s): LIPASE, AMYLASE in the last 168 hours. No results for input(s): AMMONIA in the last 168 hours. Coagulation Profile: No results for input(s): INR, PROTIME in the last 168 hours. Cardiac Enzymes: No results for input(s): CKTOTAL, CKMB, CKMBINDEX, TROPONINI in the last 168 hours. BNP (last 3 results) No results for input(s): PROBNP in the last 8760 hours. HbA1C: No results for input(s): HGBA1C in the last 72 hours. CBG: Recent Labs  Lab 07/03/20 0639 07/03/20 0750 07/03/20 0854 07/03/20 0950 07/03/20 1057  GLUCAP 167* 102* 78 97 116*   Lipid Profile: No results for input(s): CHOL, HDL, LDLCALC, TRIG, CHOLHDL, LDLDIRECT in the last 72 hours. Thyroid Function Tests: No results for input(s): TSH, T4TOTAL, FREET4, T3FREE, THYROIDAB in the last 72 hours. Anemia Panel: No results for input(s): VITAMINB12, FOLATE, FERRITIN, TIBC, IRON, RETICCTPCT in the last 72 hours. Sepsis Labs: No results for input(s): PROCALCITON, LATICACIDVEN in the last 168 hours.  Recent Results (from the past 240 hour(s))  Respiratory Panel by RT PCR (Flu  A&B, Covid) - Nasopharyngeal Swab     Status: None   Collection Time: 07/02/20  9:47 PM   Specimen: Nasopharyngeal Swab  Result Value Ref Range Status   SARS Coronavirus 2 by RT PCR NEGATIVE NEGATIVE Final    Comment: (NOTE) SARS-CoV-2 target nucleic acids are NOT DETECTED.  The SARS-CoV-2 RNA is generally detectable in upper respiratoy specimens during the acute phase of infection. The lowest concentration of SARS-CoV-2 viral copies this assay can detect is 131 copies/mL. A negative result does not preclude SARS-Cov-2 infection and should not be used as the sole basis for treatment or other patient management decisions. A negative result may occur with  improper specimen collection/handling, submission of specimen other than nasopharyngeal swab, presence of viral mutation(s) within the areas targeted by this assay, and inadequate number of viral copies (<131 copies/mL). A negative result must be combined with clinical observations, patient history, and epidemiological information. The expected result is Negative.  Fact Sheet for Patients:  PinkCheek.be  Fact Sheet for Healthcare Providers:  GravelBags.it  This test is no t yet approved or cleared by the [place] and  has been authorized for detection and/or diagnosis of SARS-CoV-2 by FDA under an Emergency Use Authorization (EUA). This EUA will remain  in effect (meaning this test can be used) for the duration of the COVID-19 declaration under Section 564(b)(1) of the Act, 21 U.S.C. section 360bbb-3(b)(1), unless the authorization is terminated or revoked sooner.     Influenza A by PCR NEGATIVE NEGATIVE Final   Influenza B by PCR NEGATIVE NEGATIVE Final    Comment: (NOTE) The Xpert Xpress SARS-CoV-2/FLU/RSV assay is intended as an aid in  the diagnosis of influenza from Nasopharyngeal swab specimens and  should not be used as a sole basis for treatment. Nasal  washings and  aspirates are unacceptable for Xpert Xpress SARS-CoV-2/FLU/RSV  testing.  Fact Sheet for Patients: PinkCheek.be  Fact Sheet for Healthcare Providers: GravelBags.it  This test is not yet approved or cleared by the [place] FDA and  has been authorized for detection and/or diagnosis of SARS-CoV-2 by  FDA under an Emergency Use Authorization (EUA). This EUA will remain  in effect (meaning this test can be used) for the duration of the  Covid-19 declaration under Section 564(b)(1) of the Act, 21  U.S.C. section 360bbb-3(b)(1), unless the authorization is  terminated or revoked. Performed at [facility], [place] [address], [place], [place] [address]   MRSA PCR Screening     Status: None   Collection Time: 07/03/20 12:09 AM   Specimen: Nasopharyngeal  Result Value Ref Range Status   MRSA by PCR NEGATIVE NEGATIVE Final    Comment:        The GeneXpert MRSA Assay (FDA approved for NASAL specimens only), is one component of a comprehensive MRSA colonization surveillance program. It is not intended to diagnose MRSA infection nor to guide or monitor treatment for MRSA infections. Performed at [facility], [place] [address], [place], [place] [address]          Radiology Studies: DG Chest Port 1 View  Result Date: 07/02/2020 CLINICAL DATA:  Hyperglycemia EXAM: PORTABLE CHEST 1 VIEW COMPARISON:  07/09/2018 FINDINGS: The heart size and mediastinal contours are within normal limits. Both lungs are clear. The visualized skeletal structures are unremarkable. IMPRESSION: No active disease. Electronically Signed   By: Randa Ngo M.D.   On: 07/02/2020 20:22        Scheduled Meds: . aspirin  81 mg Oral q morning - 10a  . atorvastatin  40 mg Oral Daily  . brimonidine  1 drop Left Eye BID  . Chlorhexidine Gluconate Cloth  6 each Topical Daily  . dorzolamide-timolol  1  drop Left Eye BID  . DULoxetine  60 mg Oral Daily  . enalapril  20 mg Oral Daily  . enoxaparin (LOVENOX) injection  40 mg Subcutaneous Q24H  . insulin aspart  0-5 Units Subcutaneous QHS  . insulin aspart  0-9 Units Subcutaneous TID WC  . insulin glargine  10 Units Subcutaneous Q24H  . latanoprost  1 drop Right Eye QHS  . mouth rinse  15 mL Mouth Rinse BID   Continuous Infusions: . cefTRIAXone (ROCEPHIN)  IV Stopped (07/03/20 0226)  . insulin Stopped (07/03/20 0752)     LOS: 1 day    Time spent: 35 minutes    Barb Merino, MD [facility] Pager [phone]

## 2020-07-04 LAB — PHOSPHORUS: Phosphorus: 2.9 mg/dL (ref 2.5–4.6)

## 2020-07-04 LAB — COMPREHENSIVE METABOLIC PANEL
ALT: 25 U/L (ref 0–44)
AST: 23 U/L (ref 15–41)
Albumin: 3 g/dL — ABNORMAL LOW (ref 3.5–5.0)
Alkaline Phosphatase: 52 U/L (ref 38–126)
Anion gap: 7 (ref 5–15)
BUN: 30 mg/dL — ABNORMAL HIGH (ref 8–23)
CO2: 21 mmol/L — ABNORMAL LOW (ref 22–32)
Calcium: 8.4 mg/dL — ABNORMAL LOW (ref 8.9–10.3)
Chloride: 107 mmol/L (ref 98–111)
Creatinine, Ser: 0.83 mg/dL (ref 0.44–1.00)
GFR, Estimated: >60 mL/min (ref >60)
Glucose, Bld: 260 mg/dL — ABNORMAL HIGH (ref 70–99)
Potassium: 4.9 mmol/L (ref 3.5–5.1)
Sodium: 135 mmol/L (ref 135–145)
Total Bilirubin: 1 mg/dL (ref 0.3–1.2)
Total Protein: 5 g/dL — ABNORMAL LOW (ref 6.5–8.1)

## 2020-07-04 LAB — CBC WITH DIFFERENTIAL/PLATELET
Abs Immature Granulocytes: 0.02 10*3/uL (ref 0.00–0.07)
Basophils Absolute: 0 10*3/uL (ref 0.0–0.1)
Basophils Relative: 0 %
Eosinophils Absolute: 0 10*3/uL (ref 0.0–0.5)
Eosinophils Relative: 1 %
HCT: 33.9 % — ABNORMAL LOW (ref 36.0–46.0)
Hemoglobin: 11.1 g/dL — ABNORMAL LOW (ref 12.0–15.0)
Immature Granulocytes: 1 %
Lymphocytes Relative: 9 %
Lymphs Abs: 0.3 10*3/uL — ABNORMAL LOW (ref 0.7–4.0)
MCH: 31.3 pg (ref 26.0–34.0)
MCHC: 32.7 g/dL (ref 30.0–36.0)
MCV: 95.5 fL (ref 80.0–100.0)
Monocytes Absolute: 0.3 10*3/uL (ref 0.1–1.0)
Monocytes Relative: 9 %
Neutro Abs: 2.7 10*3/uL (ref 1.7–7.7)
Neutrophils Relative %: 80 %
Platelets: 171 10*3/uL (ref 150–400)
RBC: 3.55 MIL/uL — ABNORMAL LOW (ref 3.87–5.11)
RDW: 13.6 % (ref 11.5–15.5)
WBC: 3.3 10*3/uL — ABNORMAL LOW (ref 4.0–10.5)
nRBC: 0 % (ref 0.0–0.2)

## 2020-07-04 LAB — GLUCOSE, CAPILLARY
Glucose-Capillary: 338 mg/dL — ABNORMAL HIGH (ref 70–99)
Glucose-Capillary: 283 mg/dL — ABNORMAL HIGH (ref 70–99)
Glucose-Capillary: 297 mg/dL — ABNORMAL HIGH (ref 70–99)
Glucose-Capillary: 179 mg/dL — ABNORMAL HIGH (ref 70–99)

## 2020-07-04 LAB — MAGNESIUM: Magnesium: 1.8 mg/dL (ref 1.7–2.4)

## 2020-07-04 MED ORDER — INSULIN GLARGINE 100 UNIT/ML ~~LOC~~ SOLN
15.0000 [IU] | Freq: Every day | SUBCUTANEOUS | Status: DC
  Administered 2020-07-04 – 2020-07-06 (×3): 15 [IU] via SUBCUTANEOUS
  Filled 2020-07-04 (×3): qty 0.15

## 2020-07-04 MED ORDER — MAGNESIUM OXIDE 400 (241.3 MG) MG PO TABS
400.0000 mg | ORAL_TABLET | Freq: Two times a day (BID) | ORAL | Status: DC
  Administered 2020-07-04 – 2020-07-06 (×5): 400 mg via ORAL
  Filled 2020-07-04 (×5): qty 1

## 2020-07-04 NOTE — Progress Notes (Signed)
While receiving report from off going nurse, pt noted to have no IV access. Pt has no IV medications ordered tonight and per MD note is planned to d/c tomorrow. NP on call, [place], notified. Ok to leave IV out for now.

## 2020-07-04 NOTE — Evaluation (Signed)
Physical Therapy Evaluation Patient Details Name: Hayley Fisher MRN: [id number] DOB: [birth year] Today's Date: 07/04/2020   History of Present Illness  86 year old female with history of type 2 diabetes on Victoza and Metformin, hypertension, dementia who lives at home with her sister brought to ER with very high blood sugar.  Clinical Impression  Pt admitted as above and presenting with functional mobility limitations 2* generalized weakness, limited endurance and ambulatory balance deficits.  Pt would benefit from follow up rehab at SNF level to maximize IND and safety prior to return home with 29 yo sister.    Follow Up Recommendations SNF    Equipment Recommendations  None recommended by PT    Recommendations for Other Services       Precautions / Restrictions Precautions Precautions: Fall Restrictions Weight Bearing Restrictions: No      Mobility  Bed Mobility Overal bed mobility: Needs Assistance Bed Mobility: Supine to Sit     Supine to sit: Min assist;Mod assist     General bed mobility comments: Increased time with assist to manage LEs and to bring trunk to upright    Transfers Overall transfer level: Needs assistance Equipment used: Rolling walker (2 wheeled) Transfers: Sit to/from Stand Sit to Stand: Min assist;Mod assist         General transfer comment: cues for safe transition position and use of UEs to self assist  Ambulation/Gait Ambulation/Gait assistance: Min assist;+2 safety/equipment Gait Distance (Feet): 48 Feet (and 22) Assistive device: Rolling walker (2 wheeled) Gait Pattern/deviations: Step-to pattern;Step-through pattern;Decreased step length - right;Decreased step length - left;Shuffle;Trunk flexed Gait velocity: decr   General Gait Details: cues for posture and position from RW.  Physical assist for support/stability.  Distance ltd by LE fatigue  Stairs            Wheelchair Mobility    Modified Rankin (Stroke Patients  Only)       Balance Overall balance assessment: Needs assistance Sitting-balance support: Bilateral upper extremity supported;Feet supported Sitting balance-Leahy Scale: Fair     Standing balance support: During functional activity;Bilateral upper extremity supported Standing balance-Leahy Scale: Poor                               Pertinent Vitals/Pain Pain Assessment: No/denies pain    Home Living Family/patient expects to be discharged to:: Skilled nursing facility Living Arrangements: Other relatives             Home Equipment: Gilford Rile - 2 wheels      Prior Function Level of Independence: Independent with assistive device(s)         Comments: Per Rn report and chart patient lives at home with her sister. Patient states she uses a rw and independent with BADLs. Rn reports patient has an aide that comes daily.     Hand Dominance        Extremity/Trunk Assessment   Upper Extremity Assessment Upper Extremity Assessment: Generalized weakness    Lower Extremity Assessment Lower Extremity Assessment: Generalized weakness    Cervical / Trunk Assessment Cervical / Trunk Assessment: Kyphotic  Communication   Communication: No difficulties  Cognition Arousal/Alertness: Awake/alert Behavior During Therapy: WFL for tasks assessed/performed Overall Cognitive Status: No family/caregiver present to determine baseline cognitive functioning                                 General Comments: Initially alert  and able to follow commands. Slow processing time.      General Comments      Exercises     Assessment/Plan    PT Assessment Patient needs continued PT services  PT Problem List Decreased strength;Decreased activity tolerance;Decreased balance;Decreased mobility;Decreased knowledge of use of DME       PT Treatment Interventions DME instruction;Gait training;Functional mobility training;Therapeutic activities;Therapeutic  exercise;Patient/family education    PT Goals (Current goals can be found in the Care Plan section)  Acute Rehab PT Goals Patient Stated Goal: I have to get my legs stronger so I can get around PT Goal Formulation: With patient Time For Goal Achievement: 07/18/20 Potential to Achieve Goals: Good    Frequency Min 3X/week   Barriers to discharge Decreased caregiver support Lives with 96 y/o sister    Co-evaluation               AM-PAC PT "6 Clicks" Mobility  Outcome Measure Help needed turning from your back to your side while in a flat bed without using bedrails?: A Little Help needed moving from lying on your back to sitting on the side of a flat bed without using bedrails?: A Lot Help needed moving to and from a bed to a chair (including a wheelchair)?: A Lot Help needed standing up from a chair using your arms (e.g., wheelchair or bedside chair)?: A Lot Help needed to walk in hospital room?: A Lot Help needed climbing 3-5 steps with a railing? : A Lot 6 Click Score: 13    End of Session Equipment Utilized During Treatment: Gait belt Activity Tolerance: Patient tolerated treatment well;Patient limited by fatigue Patient left: in chair;with call bell/phone within reach;with chair alarm set Nurse Communication: Mobility status PT Visit Diagnosis: Difficulty in walking, not elsewhere classified (R26.2);Muscle weakness (generalized) (M62.81)    Time: 5366-4403 PT Time Calculation (min) (ACUTE ONLY): 24 min   Charges:   PT Evaluation $PT Eval Low Complexity: 1 Low PT Treatments $Gait Training: 8-22 mins        Debe Coder PT Acute Rehabilitation Services Pager [phone] Office [phone]   Flecia Shutter 07/04/2020, 1:13 PM

## 2020-07-04 NOTE — Progress Notes (Signed)
PROGRESS NOTE    Hayley Fisher  NGE:[id number] DOB: 1931-05-22 DOA: 07/02/2020 PCP: Danna Hefty, DO    Brief Narrative:  86 year old female with history of type 2 diabetes on Victoza and Metformin, hypertension, dementia who lives at home with her sister brought to ER with very high blood sugar.  Patient is poor historian.  She was noted to be urinating more frequently for past 2 days.  Her blood sugars were consistently high, unsure who called the ambulance. In the emergency room, hemodynamically stable.  Blood sugars more than 600 with pH 7.2 and anion gap more than 20.  Urinalysis abnormal.  Assessment & Plan:   Principal Problem:   Uncontrolled type 2 diabetes mellitus with hyperglycemia (HCC) Active Problems:   HYPERTENSION, BENIGN SYSTEMIC   Metabolic acidosis   UTI (urinary tract infection)   Hyperkalemia  Diabetic ketoacidosis with underlying history of type 2 diabetes, uncontrolled: Unsure medication intake at home, she has progressive dementia and apparently takes her Victoza herself. Admitted and treated with IV fluid, electrolyte replacement, IV insulin with clinical and biochemical improvement.  Anion gap closed. Hemoglobin A1c is 10.   Patient developed DKA, will need treatment with insulin equivalent.   On Lantus 10 units, increase to 15 units.  Keep on sliding scale insulin. We will discharge patient on Lantus 15 units at night and Metformin 1000 mg twice a day.  She will receive insulin supervised by her family.  Essential hypertension: Blood pressure is stable.  All home medications resumed including enalapril.  Acute UTI present on admission: Ruled out.  Less than 10,000 colonies of E. coli.  Discontinue Rocephin.    Dementia without behavioral disturbances: Lives at home with family.  Has adequate support.  Discharged with home health OT PT.  Still has significantly elevated blood sugars.  Continue to work on adjusting dose of insulin. Can transfer to  telemetry bed. Work with PT OT again today. Discussed with patient's daughter at the bedside, she will make necessary arrangements at home today for her to facilitate go home tomorrow.   DVT prophylaxis: enoxaparin (LOVENOX) injection 40 mg Start: 07/03/20 1000   Code Status: Full code Family Communication: Daughter at the bedside. Disposition Plan: Status is: Inpatient  Remains inpatient appropriate because:Inpatient level of care appropriate due to severity of illness   Dispo: The patient is from: Home              Anticipated d/c is to: Home with home health.              Anticipated d/c date is: Architectural technologist.              Patient currently is not medically stable to d/c.         Consultants:   None  Procedures:   None  Antimicrobials:   Rocephin, 11/11---   Subjective: Seen and examined.  No overnight events.  She is more awake and conversant today.  She wanted me to look at her sore on her back.  Blood sugars more than 200.  Denies any nausea vomiting.  Daughter at the bedside.  Objective: Vitals:   07/04/20 0428 07/04/20 0500 07/04/20 0600 07/04/20 0700  BP:   128/71   Pulse:   90 88  Resp:  14 12 13   Temp: 98.2 F (36.8 C)     TempSrc: Oral     SpO2:   (!) 89% 98%    Intake/Output Summary (Last 24 hours) at 07/04/2020 1043 Last data filed  at 07/04/2020 0050 Gross per 24 hour  Intake 420 ml  Output 1000 ml  Net -580 ml   There were no vitals filed for this visit.  Examination:  General exam: Appears calm and comfortable, not in any distress. Patient is alert and awake.  She is oriented x2.  She is more conversant today. Respiratory system: Clear to auscultation. Respiratory effort normal. Cardiovascular system: S1 & S2 heard, RRR. No JVD, murmurs, rubs, gallops or clicks. No pedal edema. Gastrointestinal system: Abdomen is nondistended, soft and nontender. No organomegaly or masses felt. Normal bowel sounds heard. Central nervous system: Alert  and oriented x2. Extremities: Symmetric 5 x 5 power. Skin: No rashes, lesions or ulcers Psychiatry: Judgement and insight appear compromised.Mood & affect appropriate.  Patient has healed pressure ulcer scar on her buttock.    Data Reviewed: I have personally reviewed following labs and imaging studies  CBC: Recent Labs  Lab 07/02/20 1843 07/03/20 0255 07/04/20 0258  WBC 4.2 4.0 3.3*  NEUTROABS  --   --  2.7  HGB 12.5 11.0* 11.1*  HCT 40.2 34.3* 33.9*  MCV 100.2* 96.9 95.5  PLT 235 204 811   Basic Metabolic Panel: Recent Labs  Lab 07/02/20 1843 07/03/20 0255 07/03/20 0635 07/04/20 0258  NA 133* 135 138 135  K 6.0* 4.7 4.6 4.9  CL 98 106 108 107  CO2 13* 19* 22 21*  GLUCOSE 610* 220* 120* 260*  BUN 31* 27* 27* 30*  CREATININE 1.14* 0.77 0.76 0.83  CALCIUM 9.1 8.6* 8.8* 8.4*  MG  --   --   --  1.8  PHOS  --   --   --  2.9   GFR: CrCl cannot be calculated (Unknown ideal weight.). Liver Function Tests: Recent Labs  Lab 07/04/20 0258  AST 23  ALT 25  ALKPHOS 52  BILITOT 1.0  PROT 5.0*  ALBUMIN 3.0*   No results for input(s): LIPASE, AMYLASE in the last 168 hours. No results for input(s): AMMONIA in the last 168 hours. Coagulation Profile: No results for input(s): INR, PROTIME in the last 168 hours. Cardiac Enzymes: No results for input(s): CKTOTAL, CKMB, CKMBINDEX, TROPONINI in the last 168 hours. BNP (last 3 results) No results for input(s): PROBNP in the last 8760 hours. HbA1C: No results for input(s): HGBA1C in the last 72 hours. CBG: Recent Labs  Lab 07/03/20 1442 07/03/20 1553 07/03/20 1639 07/03/20 2134 07/04/20 0948  GLUCAP 191* 211* 201* 202* 338*   Lipid Profile: No results for input(s): CHOL, HDL, LDLCALC, TRIG, CHOLHDL, LDLDIRECT in the last 72 hours. Thyroid Function Tests: No results for input(s): TSH, T4TOTAL, FREET4, T3FREE, THYROIDAB in the last 72 hours. Anemia Panel: No results for input(s): VITAMINB12, FOLATE, FERRITIN,  TIBC, IRON, RETICCTPCT in the last 72 hours. Sepsis Labs: No results for input(s): PROCALCITON, LATICACIDVEN in the last 168 hours.  Recent Results (from the past 240 hour(s))  Respiratory Panel by RT PCR (Flu A&B, Covid) - Nasopharyngeal Swab     Status: None   Collection Time: 07/02/20  9:47 PM   Specimen: Nasopharyngeal Swab  Result Value Ref Range Status   SARS Coronavirus 2 by RT PCR NEGATIVE NEGATIVE Final    Comment: (NOTE) SARS-CoV-2 target nucleic acids are NOT DETECTED.  The SARS-CoV-2 RNA is generally detectable in upper respiratoy specimens during the acute phase of infection. The lowest concentration of SARS-CoV-2 viral copies this assay can detect is 131 copies/mL. A negative result does not preclude SARS-Cov-2 infection and should not  be used as the sole basis for treatment or other patient management decisions. A negative result may occur with  improper specimen collection/handling, submission of specimen other than nasopharyngeal swab, presence of viral mutation(s) within the areas targeted by this assay, and inadequate number of viral copies (<131 copies/mL). A negative result must be combined with clinical observations, patient history, and epidemiological information. The expected result is Negative.  Fact Sheet for Patients:  PinkCheek.be  Fact Sheet for Healthcare Providers:  GravelBags.it  This test is no t yet approved or cleared by the [place] FDA and  has been authorized for detection and/or diagnosis of SARS-CoV-2 by FDA under an Emergency Use Authorization (EUA). This EUA will remain  in effect (meaning this test can be used) for the duration of the COVID-19 declaration under Section 564(b)(1) of the Act, 21 U.S.C. section 360bbb-3(b)(1), unless the authorization is terminated or revoked sooner.     Influenza A by PCR NEGATIVE NEGATIVE Final   Influenza B by PCR NEGATIVE NEGATIVE Final     Comment: (NOTE) The Xpert Xpress SARS-CoV-2/FLU/RSV assay is intended as an aid in  the diagnosis of influenza from Nasopharyngeal swab specimens and  should not be used as a sole basis for treatment. Nasal washings and  aspirates are unacceptable for Xpert Xpress SARS-CoV-2/FLU/RSV  testing.  Fact Sheet for Patients: PinkCheek.be  Fact Sheet for Healthcare Providers: GravelBags.it  This test is not yet approved or cleared by the [place] FDA and  has been authorized for detection and/or diagnosis of SARS-CoV-2 by  FDA under an Emergency Use Authorization (EUA). This EUA will remain  in effect (meaning this test can be used) for the duration of the  Covid-19 declaration under Section 564(b)(1) of the Act, 21  U.S.C. section 360bbb-3(b)(1), unless the authorization is  terminated or revoked. Performed at [name], [place] [address] [place], [place] [address]   Urine Culture     Status: Abnormal   Collection Time: 07/02/20 10:14 PM   Specimen: Urine, Random  Result Value Ref Range Status   Specimen Description   Final    URINE, RANDOM Performed at [place] [address], [place], [place] [address]    Special Requests   Final    NONE Performed at [facility], [place] [address] [place], [place] [address]    Culture (A)  Final    <10,000 COLONIES/mL INSIGNIFICANT GROWTH Performed at [place] [address] [place], [place] [address]    Report Status 07/03/2020 FINAL  Final  MRSA PCR Screening     Status: None   Collection Time: 07/03/20 12:09 AM   Specimen: Nasopharyngeal  Result Value Ref Range Status   MRSA by PCR NEGATIVE NEGATIVE Final    Comment:        The GeneXpert MRSA Assay (FDA approved for NASAL specimens only), is one component of a comprehensive MRSA colonization surveillance program. It is not intended to diagnose  MRSA infection nor to guide or monitor treatment for MRSA infections. Performed at [facility], [place] [address] [place], [place] [address]          Radiology Studies: DG Chest Port 1 View  Result Date: 07/02/2020 CLINICAL DATA:  Hyperglycemia EXAM: PORTABLE CHEST 1 VIEW COMPARISON:  07/09/2018 FINDINGS: The heart size and mediastinal contours are within normal limits. Both lungs are clear. The visualized skeletal structures are unremarkable. IMPRESSION: No active disease. Electronically Signed   By: Legrand Como  Owens Shark M.D.   On: 07/02/2020 20:22        Scheduled Meds: . aspirin  81 mg Oral q morning - 10a  . atorvastatin  40 mg Oral Daily  . brimonidine  1 drop Left Eye BID  . Chlorhexidine Gluconate Cloth  6 each Topical Daily  . dorzolamide-timolol  1 drop Left Eye BID  . DULoxetine  60 mg Oral Daily  . enalapril  20 mg Oral Daily  . enoxaparin (LOVENOX) injection  40 mg Subcutaneous Q24H  . insulin aspart  0-5 Units Subcutaneous QHS  . insulin aspart  0-9 Units Subcutaneous TID WC  . insulin glargine  15 Units Subcutaneous Daily  . latanoprost  1 drop Right Eye QHS  . mouth rinse  15 mL Mouth Rinse BID   Continuous Infusions: . cefTRIAXone (ROCEPHIN)  IV Stopped (07/04/20 0050)     LOS: 2 days    Time spent: 30 minutes    Barb Merino, MD [facility] Pager [phone]

## 2020-07-05 LAB — GLUCOSE, CAPILLARY
Glucose-Capillary: 186 mg/dL — ABNORMAL HIGH (ref 70–99)
Glucose-Capillary: 356 mg/dL — ABNORMAL HIGH (ref 70–99)
Glucose-Capillary: 217 mg/dL — ABNORMAL HIGH (ref 70–99)
Glucose-Capillary: 347 mg/dL — ABNORMAL HIGH (ref 70–99)

## 2020-07-05 NOTE — TOC Progression Note (Signed)
Transition of Care [facility]) - Progression Note    Patient Details  Name: Valjean Ruppel MRN: [id number] Date of Birth: 1931-03-16  Transition of Care [facility]) CM/SW Contact  Shade Flood, LCSW Phone Number: 07/05/2020, 1:14 PM  Clinical Narrative:     Received update from MD that pt and family now want SNF at dc. Spoke with pt's daughter who states that she spoke with her mother again and they have agree upon SNF. Discussed CMS provider options and will refer as requested. Pt will need insurance auth prior to dc.   TOC will follow.  Expected Discharge Plan: [place] Barriers to Discharge: Continued Medical Work up  Expected Discharge Plan and Services Expected Discharge Plan: [place]   Discharge Planning Services: CM Consult   Living arrangements for the past 2 months: Single Family Home                                       Social Determinants of Health (SDOH) Interventions    Readmission Risk Interventions Readmission Risk Prevention Plan 07/05/2020  Transportation Screening Complete  Home Care Screening Complete  Medication Review (RN CM) Complete  Some recent data might be hidden

## 2020-07-05 NOTE — TOC Progression Note (Signed)
Transition of Care [facility]) - Progression Note    Patient Details  Name: Hayley Fisher MRN: [id number] Date of Birth: September 18, 1930  Transition of Care [facility]) CM/SW Contact  Shade Flood, LCSW Phone Number: 07/05/2020, 11:00 AM  Clinical Narrative:     TOC following. PT recommending SNF rehab at dc. Met with pt today to discuss. Per pt, she lives with her sister and she was independent in ADLs prior to admission. Pt uses a RW at home though she states she needs a new one. She would like TOC to arrange prior to dc. Pt states that neither she nor her sister drive but that other family members or friends help with transport on errands, appointments, pharmacy, etc. Pt states she prefers to go home with [facility] at dc. She states she has had [facility] in the past though she does not remember which provider she had.   Pt's RN came by the room during [facility] visit and stated that she had spoken with pt's daughter and family is agreeable to pt returning home with HH at dc. RN also stated that pt did very well with her this AM when she got up to get to the bathroom and then the recliner.   TOC will follow for [facility] and DME needs. Anticipating dc home tomorrow.  Expected Discharge Plan: [place] Barriers to Discharge: Continued Medical Work up  Expected Discharge Plan and Services Expected Discharge Plan: [place]   Discharge Planning Services: CM Consult   Living arrangements for the past 2 months: Single Family Home                                       Social Determinants of Health (SDOH) Interventions    Readmission Risk Interventions Readmission Risk Prevention Plan 07/05/2020  Transportation Screening Complete  Home Care Screening Complete  Medication Review (RN CM) Complete  Some recent data might be hidden

## 2020-07-05 NOTE — Progress Notes (Signed)
PROGRESS NOTE    Hayley Fisher  ERX:[id number] DOB: 11-03-1930 DOA: 07/02/2020 PCP: Danna Hefty, DO    Brief Narrative:  86 year old female with history of type 2 diabetes on Victoza and Metformin, hypertension, dementia who lives at home with her sister brought to ER with very high blood sugar.  Patient is poor historian.  She was noted to be urinating more frequently for past 2 days.  Her blood sugars were consistently high, unsure who called the ambulance. In the emergency room, hemodynamically stable.  Blood sugars more than 600 with pH 7.2 and anion gap more than 20.  Urinalysis abnormal.  Assessment & Plan:   Principal Problem:   Uncontrolled type 2 diabetes mellitus with hyperglycemia (HCC) Active Problems:   HYPERTENSION, BENIGN SYSTEMIC   Metabolic acidosis   UTI (urinary tract infection)   Hyperkalemia  Diabetic ketoacidosis with underlying history of type 2 diabetes, uncontrolled: Unsure medication intake at home, she has progressive dementia and apparently takes her Victoza herself. Admitted and treated with IV fluid, electrolyte replacement, IV insulin with clinical and biochemical improvement.  Anion gap closed. Hemoglobin A1c is 10.   Patient developed DKA, will need treatment with insulin equivalent.   On Lantus 15 units .Keep on sliding scale insulin. We will discharge patient on Lantus 15 units at night and Metformin 1000 mg twice a day.    Essential hypertension: Blood pressure is stable.  All home medications resumed including enalapril.  Acute UTI present on admission: Ruled out.  Less than 10,000 colonies of E. coli.  Discontinue Rocephin.    Dementia without behavioral disturbances: Lives at home with family. Deconditioned with ongoing medical issues and DKA. She mobilized with PT OT. Recommended inpatient therapies at a skilled nursing facility.   DVT prophylaxis: enoxaparin (LOVENOX) injection 40 mg Start: 07/03/20 1000   Code Status: Full  code Family Communication: Daughter on the phone. Disposition Plan: Status is: Inpatient  Remains inpatient appropriate because:Inpatient level of care appropriate due to severity of illness   Dispo: The patient is from: Home              Anticipated d/c is to: Skilled nursing facility.              Anticipated d/c date is: Architectural technologist.              Patient currently is medically stable.         Consultants:   None  Procedures:   None  Antimicrobials:   Rocephin, 11/11--- 11/13.   Subjective: Seen and examined. No overnight events. Blood sugars better now. Patient is more awake and interactive. Found very deconditioned and needed 2 people support to mobilize. She is more worried about her sore on her back.  Objective: Vitals:   07/04/20 2101 07/05/20 0549 07/05/20 0937 07/05/20 0939  BP: (!) 96/54 118/88 (!) 88/52 (!) 96/53  Pulse: 89 90 94 92  Resp: 20 20    Temp: 98.3 F (36.8 C) 98.4 F (36.9 C)    TempSrc:      SpO2: 100% 99%    Weight:      Height:        Intake/Output Summary (Last 24 hours) at 07/05/2020 1117 Last data filed at 07/05/2020 0030 Gross per 24 hour  Intake 480 ml  Output 200 ml  Net 280 ml   Filed Weights   07/04/20 1613  Weight: 58 kg    Examination:  Physical Exam Constitutional:      Appearance:  Normal appearance.     Comments: Patient looks comfortable. She is frail looking and appropriate for her age.  HENT:     Head: Atraumatic.  Cardiovascular:     Heart sounds: Normal heart sounds.  Pulmonary:     Breath sounds: Normal breath sounds.  Musculoskeletal:     Cervical back: Neck supple.  Skin:    General: Skin is warm and dry.     Comments: Patient has a dry healed pressure ulcer on her buttock.      Data Reviewed: I have personally reviewed following labs and imaging studies  CBC: Recent Labs  Lab 07/02/20 1843 07/03/20 0255 07/04/20 0258  WBC 4.2 4.0 3.3*  NEUTROABS  --   --  2.7  HGB 12.5 11.0* 11.1*   HCT 40.2 34.3* 33.9*  MCV 100.2* 96.9 95.5  PLT 235 204 883   Basic Metabolic Panel: Recent Labs  Lab 07/02/20 1843 07/03/20 0255 07/03/20 0635 07/04/20 0258  NA 133* 135 138 135  K 6.0* 4.7 4.6 4.9  CL 98 106 108 107  CO2 13* 19* 22 21*  GLUCOSE 610* 220* 120* 260*  BUN 31* 27* 27* 30*  CREATININE 1.14* 0.77 0.76 0.83  CALCIUM 9.1 8.6* 8.8* 8.4*  MG  --   --   --  1.8  PHOS  --   --   --  2.9   GFR: Estimated Creatinine Clearance: 39.1 mL/min (by C-G formula based on SCr of 0.83 mg/dL). Liver Function Tests: Recent Labs  Lab 07/04/20 0258  AST 23  ALT 25  ALKPHOS 52  BILITOT 1.0  PROT 5.0*  ALBUMIN 3.0*   No results for input(s): LIPASE, AMYLASE in the last 168 hours. No results for input(s): AMMONIA in the last 168 hours. Coagulation Profile: No results for input(s): INR, PROTIME in the last 168 hours. Cardiac Enzymes: No results for input(s): CKTOTAL, CKMB, CKMBINDEX, TROPONINI in the last 168 hours. BNP (last 3 results) No results for input(s): PROBNP in the last 8760 hours. HbA1C: No results for input(s): HGBA1C in the last 72 hours. CBG: Recent Labs  Lab 07/04/20 0948 07/04/20 1141 07/04/20 1710 07/04/20 2100 07/05/20 0745  GLUCAP 338* 283* 297* 179* 186*   Lipid Profile: No results for input(s): CHOL, HDL, LDLCALC, TRIG, CHOLHDL, LDLDIRECT in the last 72 hours. Thyroid Function Tests: No results for input(s): TSH, T4TOTAL, FREET4, T3FREE, THYROIDAB in the last 72 hours. Anemia Panel: No results for input(s): VITAMINB12, FOLATE, FERRITIN, TIBC, IRON, RETICCTPCT in the last 72 hours. Sepsis Labs: No results for input(s): PROCALCITON, LATICACIDVEN in the last 168 hours.  Recent Results (from the past 240 hour(s))  Respiratory Panel by RT PCR (Flu A&B, Covid) - Nasopharyngeal Swab     Status: None   Collection Time: 07/02/20  9:47 PM   Specimen: Nasopharyngeal Swab  Result Value Ref Range Status   SARS Coronavirus 2 by RT PCR NEGATIVE NEGATIVE  Final    Comment: (NOTE) SARS-CoV-2 target nucleic acids are NOT DETECTED.  The SARS-CoV-2 RNA is generally detectable in upper respiratoy specimens during the acute phase of infection. The lowest concentration of SARS-CoV-2 viral copies this assay can detect is 131 copies/mL. A negative result does not preclude SARS-Cov-2 infection and should not be used as the sole basis for treatment or other patient management decisions. A negative result may occur with  improper specimen collection/handling, submission of specimen other than nasopharyngeal swab, presence of viral mutation(s) within the areas targeted by this assay, and inadequate number  of viral copies (<131 copies/mL). A negative result must be combined with clinical observations, patient history, and epidemiological information. The expected result is Negative.  Fact Sheet for Patients:  PinkCheek.be  Fact Sheet for Healthcare Providers:  GravelBags.it  This test is no t yet approved or cleared by the [place] FDA and  has been authorized for detection and/or diagnosis of SARS-CoV-2 by FDA under an Emergency Use Authorization (EUA). This EUA will remain  in effect (meaning this test can be used) for the duration of the COVID-19 declaration under Section 564(b)(1) of the Act, 21 U.S.C. section 360bbb-3(b)(1), unless the authorization is terminated or revoked sooner.     Influenza A by PCR NEGATIVE NEGATIVE Final   Influenza B by PCR NEGATIVE NEGATIVE Final    Comment: (NOTE) The Xpert Xpress SARS-CoV-2/FLU/RSV assay is intended as an aid in  the diagnosis of influenza from Nasopharyngeal swab specimens and  should not be used as a sole basis for treatment. Nasal washings and  aspirates are unacceptable for Xpert Xpress SARS-CoV-2/FLU/RSV  testing.  Fact Sheet for Patients: PinkCheek.be  Fact Sheet for Healthcare  Providers: GravelBags.it  This test is not yet approved or cleared by the [place] FDA and  has been authorized for detection and/or diagnosis of SARS-CoV-2 by  FDA under an Emergency Use Authorization (EUA). This EUA will remain  in effect (meaning this test can be used) for the duration of the  Covid-19 declaration under Section 564(b)(1) of the Act, 21  U.S.C. section 360bbb-3(b)(1), unless the authorization is  terminated or revoked. Performed at [facility], [place] [address], [place], [place] [address]   Urine Culture     Status: Abnormal   Collection Time: 07/02/20 10:14 PM   Specimen: Urine, Random  Result Value Ref Range Status   Specimen Description   Final    URINE, RANDOM Performed at [facility] [address], [place], [place] [address]    Special Requests   Final    NONE Performed at [facility], [place] [address] [place], [place] [address]    Culture (A)  Final    <10,000 COLONIES/mL INSIGNIFICANT GROWTH Performed at [facility] [address] [place], [place] [address]    Report Status 07/03/2020 FINAL  Final  MRSA PCR Screening     Status: None   Collection Time: 07/03/20 12:09 AM   Specimen: Nasopharyngeal  Result Value Ref Range Status   MRSA by PCR NEGATIVE NEGATIVE Final    Comment:        The GeneXpert MRSA Assay (FDA approved for NASAL specimens only), is one component of a comprehensive MRSA colonization surveillance program. It is not intended to diagnose MRSA infection nor to guide or monitor treatment for MRSA infections. Performed at [facility], [place] [address] [place], [place] [address]          Radiology Studies: No results found.      Scheduled Meds: . aspirin  81 mg Oral q morning - 10a  . atorvastatin  40 mg Oral Daily  . brimonidine  1 drop Left Eye BID  . Chlorhexidine Gluconate Cloth  6 each Topical  Daily  . dorzolamide-timolol  1 drop Left Eye BID  . DULoxetine  60 mg Oral Daily  . enalapril  20 mg Oral Daily  . enoxaparin (LOVENOX) injection  40 mg Subcutaneous Q24H  . insulin aspart  0-5 Units Subcutaneous QHS  . insulin aspart  0-9 Units Subcutaneous TID WC  . insulin glargine  15 Units Subcutaneous Daily  . latanoprost  1 drop Right Eye QHS  . magnesium oxide  400 mg Oral BID  . mouth rinse  15 mL Mouth Rinse BID   Continuous Infusions:    LOS: 3 days    Time spent: 30 minutes    Barb Merino, MD [facility] Pager [phone]

## 2020-07-05 NOTE — NC FL2 (Signed)
[place] MEDICAID FL2 LEVEL OF CARE SCREENING TOOL     IDENTIFICATION  Patient Name: Hayley Fisher Birthdate: April 09, 1931 Sex: female Admission Date (Current Location): 07/02/2020  [facility] and [place] Number:  Herbalist and Address:  [facility],  [place] [address], [place]      Provider Number: [id number]  Attending Physician Name and Address:  Barb Merino, MD  Relative Name and Phone Number:       Current Level of Care: Hospital Recommended Level of Care: [place] Prior Approval Number:    Date Approved/Denied:   PASRR Number: [id number]  Discharge Plan: SNF    Current Diagnoses: Patient Active Problem List   Diagnosis Date Noted  . Uncontrolled type 2 diabetes mellitus with hyperglycemia ([place]) 07/02/2020  . Metabolic acidosis 31/49/7026  . UTI (urinary tract infection) 07/02/2020  . Type 2 diabetes mellitus with hyperosmolar hyperglycemic state (HHS) ([place]) 07/02/2020  . Hyperkalemia 07/02/2020  . Vaginal candidiasis 06/21/2020  . Atrophic vaginitis 06/21/2020  . Skin ulcer of sacrum, limited to breakdown of skin ([place]) 03/16/2017  . Chronic venous insufficiency 11/09/2015  . DDD (degenerative disc disease), lumbosacral 11/24/2014  . Type 2 diabetes mellitus with diabetic neuropathy ([place]) 10/19/2006  . Hyperlipidemia associated with type 2 diabetes mellitus ([place]) 10/19/2006  . HYPERTENSION, BENIGN SYSTEMIC 10/19/2006  . Osteopenia 10/19/2006    Orientation RESPIRATION BLADDER Height & Weight     Self, Time, Situation, Place  Normal Continent Weight: 127 lb 13.9 oz (58 kg) Height:  5\' 1"  (154.9 cm)  BEHAVIORAL SYMPTOMS/MOOD NEUROLOGICAL BOWEL NUTRITION STATUS      Continent Diet (see dc summary)  AMBULATORY STATUS COMMUNICATION OF NEEDS Skin   Extensive Assist Verbally Normal                       Personal Care Assistance Level of Assistance  Bathing, Feeding, Dressing Bathing Assistance: Limited  assistance Feeding assistance: Independent Dressing Assistance: Limited assistance     Functional Limitations Info  Sight, Hearing, Speech Sight Info: Adequate Hearing Info: Adequate Speech Info: Adequate    SPECIAL CARE FACTORS FREQUENCY  PT (By licensed PT), OT (By licensed OT)     PT Frequency: 5x week OT Frequency: 3x week            Contractures Contractures Info: Not present    Additional Factors Info  Code Status, Allergies Code Status Info: Full Allergies Info: Levemir           Current Medications (07/05/2020):  This is the current hospital active medication list Current Facility-Administered Medications  Medication Dose Route Frequency Provider Last Rate Last Admin  . aspirin chewable tablet 81 mg  81 mg Oral q morning - 10a Chotiner, Yevonne Aline, MD   81 mg at 07/05/20 0940  . atorvastatin (LIPITOR) tablet 40 mg  40 mg Oral Daily Chotiner, Yevonne Aline, MD   40 mg at 07/05/20 0941  . brimonidine (ALPHAGAN) 0.15 % ophthalmic solution 1 drop  1 drop Left Eye BID Chotiner, Yevonne Aline, MD   1 drop at 07/05/20 0942  . Chlorhexidine Gluconate Cloth 2 % PADS 6 each  6 each Topical Daily Chotiner, Yevonne Aline, MD   6 each at 07/05/20 0941  . dextrose 50 % solution 0-50 mL  0-50 mL Intravenous PRN Fredia Sorrow, MD      . dextrose 50 % solution 0-50 mL  0-50 mL Intravenous PRN Chotiner, Yevonne Aline, MD      .  dorzolamide-timolol (COSOPT) 22.3-6.8 MG/ML ophthalmic solution 1 drop  1 drop Left Eye BID Chotiner, Yevonne Aline, MD   1 drop at 07/05/20 0942  . DULoxetine (CYMBALTA) DR capsule 60 mg  60 mg Oral Daily Chotiner, Yevonne Aline, MD   60 mg at 07/05/20 0941  . enalapril (VASOTEC) tablet 20 mg  20 mg Oral Daily Chotiner, Yevonne Aline, MD   20 mg at 07/03/20 1017  . enoxaparin (LOVENOX) injection 40 mg  40 mg Subcutaneous Q24H Chotiner, Yevonne Aline, MD   40 mg at 07/05/20 0941  . insulin aspart (novoLOG) injection 0-5 Units  0-5 Units Subcutaneous QHS Barb Merino, MD   2 Units at  07/03/20 2234  . insulin aspart (novoLOG) injection 0-9 Units  0-9 Units Subcutaneous TID WC Barb Merino, MD   9 Units at 07/05/20 1207  . insulin glargine (LANTUS) injection 15 Units  15 Units Subcutaneous Daily Barb Merino, MD   15 Units at 07/05/20 0941  . latanoprost (XALATAN) 0.005 % ophthalmic solution 1 drop  1 drop Right Eye QHS Chotiner, Yevonne Aline, MD   1 drop at 07/04/20 2145  . magnesium oxide (MAG-OX) tablet 400 mg  400 mg Oral BID Barb Merino, MD   400 mg at 07/05/20 0941  . MEDLINE mouth rinse  15 mL Mouth Rinse BID Chotiner, Yevonne Aline, MD   15 mL at 07/05/20 7829     Discharge Medications: Please see discharge summary for a list of discharge medications.  Relevant Imaging Results:  Relevant Lab Results:   Additional Information SSN: [id number], LCSW

## 2020-07-05 NOTE — Plan of Care (Signed)
  Problem: Education: Goal: Knowledge of General Education information will improve Description: Including pain rating scale, medication(s)/side effects and non-pharmacologic comfort measures Outcome: Progressing   Problem: Clinical Measurements: Goal: Will remain free from infection Outcome: Progressing Goal: Respiratory complications will improve Outcome: Progressing   Problem: Activity: Goal: Risk for activity intolerance will decrease Outcome: Progressing   Problem: Nutrition: Goal: Adequate nutrition will be maintained Outcome: Progressing   Problem: Coping: Goal: Level of anxiety will decrease Outcome: Progressing   Problem: Elimination: Goal: Will not experience complications related to bowel motility Outcome: Progressing Goal: Will not experience complications related to urinary retention Outcome: Progressing   Problem: Pain Managment: Goal: General experience of comfort will improve Outcome: Progressing   Problem: Safety: Goal: Ability to remain free from injury will improve Outcome: Progressing   Problem: Skin Integrity: Goal: Risk for impaired skin integrity will decrease Outcome: Progressing   

## 2020-07-06 DIAGNOSIS — M6281 Muscle weakness (generalized): Secondary | ICD-10-CM | POA: Diagnosis not present

## 2020-07-06 DIAGNOSIS — R54 Age-related physical debility: Secondary | ICD-10-CM | POA: Diagnosis not present

## 2020-07-06 DIAGNOSIS — G301 Alzheimer's disease with late onset: Secondary | ICD-10-CM | POA: Diagnosis not present

## 2020-07-06 DIAGNOSIS — R279 Unspecified lack of coordination: Secondary | ICD-10-CM | POA: Diagnosis not present

## 2020-07-06 DIAGNOSIS — E1100 Type 2 diabetes mellitus with hyperosmolarity without nonketotic hyperglycemic-hyperosmolar coma (NKHHC): Secondary | ICD-10-CM | POA: Diagnosis not present

## 2020-07-06 DIAGNOSIS — R262 Difficulty in walking, not elsewhere classified: Secondary | ICD-10-CM | POA: Diagnosis not present

## 2020-07-06 DIAGNOSIS — E875 Hyperkalemia: Secondary | ICD-10-CM | POA: Diagnosis not present

## 2020-07-06 DIAGNOSIS — E785 Hyperlipidemia, unspecified: Secondary | ICD-10-CM | POA: Diagnosis not present

## 2020-07-06 DIAGNOSIS — E1165 Type 2 diabetes mellitus with hyperglycemia: Secondary | ICD-10-CM | POA: Diagnosis not present

## 2020-07-06 DIAGNOSIS — R531 Weakness: Secondary | ICD-10-CM | POA: Diagnosis not present

## 2020-07-06 DIAGNOSIS — E1110 Type 2 diabetes mellitus with ketoacidosis without coma: Secondary | ICD-10-CM | POA: Diagnosis not present

## 2020-07-06 DIAGNOSIS — I10 Essential (primary) hypertension: Secondary | ICD-10-CM | POA: Diagnosis not present

## 2020-07-06 DIAGNOSIS — H409 Unspecified glaucoma: Secondary | ICD-10-CM | POA: Diagnosis not present

## 2020-07-06 DIAGNOSIS — Z743 Need for continuous supervision: Secondary | ICD-10-CM | POA: Diagnosis not present

## 2020-07-06 DIAGNOSIS — E1169 Type 2 diabetes mellitus with other specified complication: Secondary | ICD-10-CM | POA: Diagnosis not present

## 2020-07-06 DIAGNOSIS — N390 Urinary tract infection, site not specified: Secondary | ICD-10-CM | POA: Diagnosis not present

## 2020-07-06 LAB — GLUCOSE, CAPILLARY
Glucose-Capillary: 126 mg/dL — ABNORMAL HIGH (ref 70–99)
Glucose-Capillary: 296 mg/dL — ABNORMAL HIGH (ref 70–99)
Glucose-Capillary: 211 mg/dL — ABNORMAL HIGH (ref 70–99)

## 2020-07-06 LAB — RESPIRATORY PANEL BY RT PCR (FLU A&B, COVID)
Influenza A by PCR: NEGATIVE
Influenza B by PCR: NEGATIVE
SARS Coronavirus 2 by RT PCR: NEGATIVE

## 2020-07-06 MED ORDER — INSULIN ASPART 100 UNIT/ML ~~LOC~~ SOLN
0.0000 [IU] | Freq: Three times a day (TID) | SUBCUTANEOUS | 11 refills | Status: DC
Start: 2020-07-06 — End: 2020-07-30

## 2020-07-06 MED ORDER — INSULIN GLARGINE 100 UNIT/ML ~~LOC~~ SOLN: 15.0000 [IU] | Freq: Every day | SUBCUTANEOUS | 11 refills | Status: DC

## 2020-07-06 MED ORDER — METFORMIN HCL 500 MG PO TABS
1000.0000 mg | ORAL_TABLET | Freq: Two times a day (BID) | ORAL | Status: DC
  Administered 2020-07-06 (×2): 1000 mg via ORAL
  Filled 2020-07-06 (×2): qty 2

## 2020-07-06 NOTE — Progress Notes (Signed)
PTAR on unit to transport patient to SNF, telemetry monitor removed and personal belongings with patient

## 2020-07-06 NOTE — Discharge Summary (Signed)
Physician Discharge Summary  Hayley Fisher WVP:[id number] DOB: 04-Jan-1931 DOA: 07/02/2020  PCP: Danna Hefty, DO  Admit date: 07/02/2020 Discharge date: 07/06/2020  Admitted From: Home Disposition: Skilled nursing facility  Recommendations for Outpatient Follow-up:  1. Follow up with PCP in 1-2 weeks, schedule follow-up   Home Health: Not applicable Equipment/Devices: Not applicable  Discharge Condition: Stable CODE STATUS: Full code Diet recommendation: Low-carb diet  Discharge summary: 86 year old female with history of type 2 diabetes on Victoza and Metformin, hypertension, dementia who lives at home with her sister brought to ER with very high blood sugar.  Patient is poor historian.  She was noted to be urinating more frequently for past 2 days.  Her blood sugars were consistently high, unsure who called the ambulance. In the emergency room, hemodynamically stable.  Blood sugars more than 600 with pH 7.2 and anion gap more than 20.  Urinalysis abnormal.  Assessment & Plan of care:  Diabetic ketoacidosis with underlying history of type 2 diabetes, uncontrolled: Unsure medication intake at home, she has progressive dementia and apparently takes her Victoza herself. Admitted and treated with IV fluid, electrolyte replacement, IV insulin with clinical and biochemical improvement.  Anion gap closed. Hemoglobin A1c is 10.   Patient developed DKA, will need treatment with insulin equivalent.   On Lantus 15 units .Keep on sliding scale insulin. We will discharge patient on Lantus 15 units at night and Metformin 1000 mg twice a day as well as sliding scale as she is going to a skilled nursing facility. From skilled nursing facility when she go home, she should be on her simplified regimen  of once a day long-acting insulin and Metformin.  Essential hypertension: Blood pressure is stable.  All home medications resumed including enalapril.  Acute UTI present on admission:   Ruled out.   Dementia without behavioral disturbances: Lives at home with family. Deconditioned with ongoing medical issues and DKA. She mobilized with PT OT. Recommended inpatient therapies at a skilled nursing facility.  Patient is clinically stable to transfer to skilled level of care today.  She will benefit with inpatient therapies before returning home independent to live with her families.   Discharge Diagnoses:  Principal Problem:   Uncontrolled type 2 diabetes mellitus with hyperglycemia (HCC) Active Problems:   HYPERTENSION, BENIGN SYSTEMIC   Metabolic acidosis   UTI (urinary tract infection)   Hyperkalemia    Discharge Instructions  Discharge Instructions    Diet - low sodium heart healthy   Complete by: As directed    Diet Carb Modified   Complete by: As directed    Increase activity slowly   Complete by: As directed      Allergies as of 07/06/2020      Reactions   Levemir [insulin Detemir] Itching      Medication List    STOP taking these medications   albuterol 108 (90 Base) MCG/ACT inhaler Commonly known as: VENTOLIN HFA   hydrocortisone 2.5 % cream   liraglutide 18 MG/3ML Sopn Commonly known as: VICTOZA   mupirocin ointment 2 % Commonly known as: Bactroban     TAKE these medications   Accu-Chek Aviva Plus test strip Generic drug: glucose blood USE TO TEST BLOOD SUGAR UP TO 3 TIMES A DAY.   Accu-Chek Softclix Lancets lancets USE TO TEST BLOOD SUGAR UP TO 3 TIMES A DAY.   aspirin 81 MG chewable tablet Chew 81 mg by mouth every morning.   atorvastatin 40 MG tablet Commonly known as: LIPITOR Take  1 tablet (40 mg total) by mouth daily.   brimonidine 0.1 % Soln Commonly known as: ALPHAGAN P Place 1 drop into the left eye 2 (two) times daily.   carbamide peroxide 6.5 % OTIC solution Commonly known as: Debrox Place 5 drops into both ears 2 (two) times daily. What changed: how much to take   dorzolamide-timolol 22.3-6.8 MG/ML  ophthalmic solution Commonly known as: COSOPT Place 1 drop into the left eye 2 (two) times daily.   DULoxetine 60 MG capsule Commonly known as: CYMBALTA TAKE 1 CAPSULE BY MOUTH EVERY DAY What changed:   how much to take  how to take this  when to take this  additional instructions   enalapril 20 MG tablet Commonly known as: VASOTEC TAKE 1 TABLET BY MOUTH EVERY DAY   insulin aspart 100 UNIT/ML injection Commonly known as: novoLOG Inject 0-9 Units into the skin 3 (three) times daily with meals.   insulin glargine 100 UNIT/ML injection Commonly known as: LANTUS Inject 0.15 mLs (15 Units total) into the skin daily. Start taking on: July 07, 2020   metFORMIN 500 MG 24 hr tablet Commonly known as: GLUCOPHAGE-XR TAKE 2 TABLETS BY MOUTH 2 TIMES DAILY What changed: when to take this   Vyzulta 0.024 % Soln Generic drug: Latanoprostene Bunod Place 1 drop into the right eye daily.       Contact information for follow-up providers    Mullis, Kiersten P, DO Follow up in 2 week(s).   Specialty: Family Medicine Contact information: [address] [place] [address] [phone]            Contact information for after-discharge care    Destination    [facility] Preferred SNF .   Service: Skilled Nursing Contact information: [address] [address] [phone]                 Allergies  Allergen Reactions  . Levemir [Insulin Detemir] Itching    Consultations:  None   Procedures/Studies: DG Chest Port 1 View  Result Date: 07/02/2020 CLINICAL DATA:  Hyperglycemia EXAM: PORTABLE CHEST 1 VIEW COMPARISON:  07/09/2018 FINDINGS: The heart size and mediastinal contours are within normal limits. Both lungs are clear. The visualized skeletal structures are unremarkable. IMPRESSION: No active disease. Electronically Signed   By: Randa Ngo M.D.   On: 07/02/2020 20:22    (Echo, Carotid, EGD,  Colonoscopy, ERCP)    Subjective: Patient was seen and examined.  No overnight events.  She is very pleasant.  She is aware about need to go to a skilled nursing facility.   Discharge Exam: Vitals:   07/05/20 2140 07/06/20 0552  BP: 123/70 102/82  Pulse: (!) 101 94  Resp:  18  Temp: 97.9 F (36.6 C) 98.2 F (36.8 C)  SpO2: 97% 98%   Vitals:   07/05/20 0939 07/05/20 1319 07/05/20 2140 07/06/20 0552  BP: (!) 96/53 (!) 100/53 123/70 102/82  Pulse: 92 (!) 52 (!) 101 94  Resp:  17  18  Temp:  98.5 F (36.9 C) 97.9 F (36.6 C) 98.2 F (36.8 C)  TempSrc:  Oral Oral Oral  SpO2:  97% 97% 98%  Weight:      Height:        General: Pt is alert, awake, not in acute distress Patient is oriented x2.  She is mostly very well oriented and communicating.  She understands most of the instructions. Cardiovascular: RRR, S1/S2 +, no rubs, no gallops Respiratory:  CTA bilaterally, no wheezing, no rhonchi Abdominal: Soft, NT, ND, bowel sounds + Extremities: no edema, no cyanosis Patient has a healed ulcer on her buttock with some dry flaky skin.    The results of significant diagnostics from this hospitalization (including imaging, microbiology, ancillary and laboratory) are listed below for reference.     Microbiology: Recent Results (from the past 240 hour(s))  Respiratory Panel by RT PCR (Flu A&B, Covid) - Nasopharyngeal Swab     Status: None   Collection Time: 07/02/20  9:47 PM   Specimen: Nasopharyngeal Swab  Result Value Ref Range Status   SARS Coronavirus 2 by RT PCR NEGATIVE NEGATIVE Final    Comment: (NOTE) SARS-CoV-2 target nucleic acids are NOT DETECTED.  The SARS-CoV-2 RNA is generally detectable in upper respiratoy specimens during the acute phase of infection. The lowest concentration of SARS-CoV-2 viral copies this assay can detect is 131 copies/mL. A negative result does not preclude SARS-Cov-2 infection and should not be used as the sole basis for treatment  or other patient management decisions. A negative result may occur with  improper specimen collection/handling, submission of specimen other than nasopharyngeal swab, presence of viral mutation(s) within the areas targeted by this assay, and inadequate number of viral copies (<131 copies/mL). A negative result must be combined with clinical observations, patient history, and epidemiological information. The expected result is Negative.  Fact Sheet for Patients:  PinkCheek.be  Fact Sheet for Healthcare Providers:  GravelBags.it  This test is no t yet approved or cleared by the [place] FDA and  has been authorized for detection and/or diagnosis of SARS-CoV-2 by FDA under an Emergency Use Authorization (EUA). This EUA will remain  in effect (meaning this test can be used) for the duration of the COVID-19 declaration under Section 564(b)(1) of the Act, 21 U.S.C. section 360bbb-3(b)(1), unless the authorization is terminated or revoked sooner.     Influenza A by PCR NEGATIVE NEGATIVE Final   Influenza B by PCR NEGATIVE NEGATIVE Final    Comment: (NOTE) The Xpert Xpress SARS-CoV-2/FLU/RSV assay is intended as an aid in  the diagnosis of influenza from Nasopharyngeal swab specimens and  should not be used as a sole basis for treatment. Nasal washings and  aspirates are unacceptable for Xpert Xpress SARS-CoV-2/FLU/RSV  testing.  Fact Sheet for Patients: PinkCheek.be  Fact Sheet for Healthcare Providers: GravelBags.it  This test is not yet approved or cleared by the [place] FDA and  has been authorized for detection and/or diagnosis of SARS-CoV-2 by  FDA under an Emergency Use Authorization (EUA). This EUA will remain  in effect (meaning this test can be used) for the duration of the  Covid-19 declaration under Section 564(b)(1) of the Act, 21  U.S.C.  section 360bbb-3(b)(1), unless the authorization is  terminated or revoked. Performed at [facility], [place] [address], [place], [place] [address]   Urine Culture     Status: Abnormal   Collection Time: 07/02/20 10:14 PM   Specimen: Urine, Random  Result Value Ref Range Status   Specimen Description   Final    URINE, RANDOM Performed at [place] [address], [place], [place] [address]    Special Requests   Final    NONE Performed at [facility], [place] [address], [place], [place] [address]    Culture (A)  Final    <10,000 COLONIES/mL INSIGNIFICANT GROWTH Performed at [place] [address] [place], [place] [address]  Report Status 07/03/2020 FINAL  Final  MRSA PCR Screening     Status: None   Collection Time: 07/03/20 12:09 AM   Specimen: Nasopharyngeal  Result Value Ref Range Status   MRSA by PCR NEGATIVE NEGATIVE Final    Comment:        The GeneXpert MRSA Assay (FDA approved for NASAL specimens only), is one component of a comprehensive MRSA colonization surveillance program. It is not intended to diagnose MRSA infection nor to guide or monitor treatment for MRSA infections. Performed at [facility], [place] [address] [place], [place] [address]      Labs: BNP (last 3 results) No results for input(s): BNP in the last 8760 hours. Basic Metabolic Panel: Recent Labs  Lab 07/02/20 1843 07/03/20 0255 07/03/20 0635 07/04/20 0258  NA 133* 135 138 135  K 6.0* 4.7 4.6 4.9  CL 98 106 108 107  CO2 13* 19* 22 21*  GLUCOSE 610* 220* 120* 260*  BUN 31* 27* 27* 30*  CREATININE 1.14* 0.77 0.76 0.83  CALCIUM 9.1 8.6* 8.8* 8.4*  MG  --   --   --  1.8  PHOS  --   --   --  2.9   Liver Function Tests: Recent Labs  Lab 07/04/20 0258  AST 23  ALT 25  ALKPHOS 52  BILITOT 1.0  PROT 5.0*  ALBUMIN 3.0*   No results for input(s): LIPASE, AMYLASE in the last 168 hours. No  results for input(s): AMMONIA in the last 168 hours. CBC: Recent Labs  Lab 07/02/20 1843 07/03/20 0255 07/04/20 0258  WBC 4.2 4.0 3.3*  NEUTROABS  --   --  2.7  HGB 12.5 11.0* 11.1*  HCT 40.2 34.3* 33.9*  MCV 100.2* 96.9 95.5  PLT 235 204 171   Cardiac Enzymes: No results for input(s): CKTOTAL, CKMB, CKMBINDEX, TROPONINI in the last 168 hours. BNP: Invalid input(s): POCBNP CBG: Recent Labs  Lab 07/05/20 1140 07/05/20 1657 07/05/20 2108 07/06/20 0731 07/06/20 1155  GLUCAP 356* 217* 347* 126* 296*   D-Dimer No results for input(s): DDIMER in the last 72 hours. Hgb A1c No results for input(s): HGBA1C in the last 72 hours. Lipid Profile No results for input(s): CHOL, HDL, LDLCALC, TRIG, CHOLHDL, LDLDIRECT in the last 72 hours. Thyroid function studies No results for input(s): TSH, T4TOTAL, T3FREE, THYROIDAB in the last 72 hours.  Invalid input(s): FREET3 Anemia work up No results for input(s): VITAMINB12, FOLATE, FERRITIN, TIBC, IRON, RETICCTPCT in the last 72 hours. Urinalysis    Component Value Date/Time   COLORURINE YELLOW 07/02/2020 1945   APPEARANCEUR HAZY (A) 07/02/2020 1945   LABSPEC 1.021 07/02/2020 1945   PHURINE 5.0 07/02/2020 1945   GLUCOSEU >=500 (A) 07/02/2020 1945   HGBUR NEGATIVE 07/02/2020 1945   HGBUR trace-intact 07/20/2010 1433   BILIRUBINUR NEGATIVE 07/02/2020 1945   BILIRUBINUR negative 06/19/2020 1630   BILIRUBINUR NEG 08/09/2016 1500   KETONESUR 80 (A) 07/02/2020 1945   PROTEINUR NEGATIVE 07/02/2020 1945   UROBILINOGEN 0.2 06/19/2020 1630   UROBILINOGEN 0.2 10/18/2014 1624   NITRITE NEGATIVE 07/02/2020 1945   LEUKOCYTESUR MODERATE (A) 07/02/2020 1945   Sepsis Labs Invalid input(s): PROCALCITONIN,  WBC,  LACTICIDVEN Microbiology Recent Results (from the past 240 hour(s))  Respiratory Panel by RT PCR (Flu A&B, Covid) - Nasopharyngeal Swab     Status: None   Collection Time: 07/02/20  9:47 PM   Specimen: Nasopharyngeal Swab  Result  Value Ref Range Status   SARS Coronavirus 2 by  RT PCR NEGATIVE NEGATIVE Final    Comment: (NOTE) SARS-CoV-2 target nucleic acids are NOT DETECTED.  The SARS-CoV-2 RNA is generally detectable in upper respiratoy specimens during the acute phase of infection. The lowest concentration of SARS-CoV-2 viral copies this assay can detect is 131 copies/mL. A negative result does not preclude SARS-Cov-2 infection and should not be used as the sole basis for treatment or other patient management decisions. A negative result may occur with  improper specimen collection/handling, submission of specimen other than nasopharyngeal swab, presence of viral mutation(s) within the areas targeted by this assay, and inadequate number of viral copies (<131 copies/mL). A negative result must be combined with clinical observations, patient history, and epidemiological information. The expected result is Negative.  Fact Sheet for Patients:  PinkCheek.be  Fact Sheet for Healthcare Providers:  GravelBags.it  This test is no t yet approved or cleared by the [place] FDA and  has been authorized for detection and/or diagnosis of SARS-CoV-2 by FDA under an Emergency Use Authorization (EUA). This EUA will remain  in effect (meaning this test can be used) for the duration of the COVID-19 declaration under Section 564(b)(1) of the Act, 21 U.S.C. section 360bbb-3(b)(1), unless the authorization is terminated or revoked sooner.     Influenza A by PCR NEGATIVE NEGATIVE Final   Influenza B by PCR NEGATIVE NEGATIVE Final    Comment: (NOTE) The Xpert Xpress SARS-CoV-2/FLU/RSV assay is intended as an aid in  the diagnosis of influenza from Nasopharyngeal swab specimens and  should not be used as a sole basis for treatment. Nasal washings and  aspirates are unacceptable for Xpert Xpress SARS-CoV-2/FLU/RSV  testing.  Fact Sheet for  Patients: PinkCheek.be  Fact Sheet for Healthcare Providers: GravelBags.it  This test is not yet approved or cleared by the [place] FDA and  has been authorized for detection and/or diagnosis of SARS-CoV-2 by  FDA under an Emergency Use Authorization (EUA). This EUA will remain  in effect (meaning this test can be used) for the duration of the  Covid-19 declaration under Section 564(b)(1) of the Act, 21  U.S.C. section 360bbb-3(b)(1), unless the authorization is  terminated or revoked. Performed at [facility], [place] [address] [place], [place] [address]   Urine Culture     Status: Abnormal   Collection Time: 07/02/20 10:14 PM   Specimen: Urine, Random  Result Value Ref Range Status   Specimen Description   Final    URINE, RANDOM Performed at [facility] [address], [place], [place] [address]    Special Requests   Final    NONE Performed at [facility], [place] [address], [place], [place] [address]    Culture (A)  Final    <10,000 COLONIES/mL INSIGNIFICANT GROWTH Performed at [place] [address], [place], [place] [address]    Report Status 07/03/2020 FINAL  Final  MRSA PCR Screening     Status: None   Collection Time: 07/03/20 12:09 AM   Specimen: Nasopharyngeal  Result Value Ref Range Status   MRSA by PCR NEGATIVE NEGATIVE Final    Comment:        The GeneXpert MRSA Assay (FDA approved for NASAL specimens only), is one component of a comprehensive MRSA colonization surveillance program. It is not intended to diagnose MRSA infection nor to guide or monitor treatment for MRSA infections. Performed at [facility], [place] [address], [place], [place] [address]  Time coordinating discharge:  35 minutes  SIGNED:   Barb Merino, MD  [facility] 07/06/2020, 1:14 PM

## 2020-07-06 NOTE — Care Management Important Message (Signed)
Important Message  Patient Details IM Letter given to the Patient. Name: Hayley Fisher MRN: [id number] Date of Birth: 08-Jan-1931   Medicare Important Message Given:  Yes     Kerin Salen 07/06/2020, 12:10 PM

## 2020-07-06 NOTE — TOC Progression Note (Addendum)
Transition of Care [facility]) - Progression Note    Patient Details  Name: Hayley Fisher MRN: [id number] Date of Birth: [birth year]  Transition of Care [facility]) CM/SW Contact  Kevontay Burks, Juliann Pulse, RN Phone Number: 07/06/2020, 9:32 AM  Clinical Narrative:   Awaiting insurance auth,SNF choice, & covid results within 24hrs of d/c.Jackie dtr will call back to review SNF choices. 11:45a-[facility] chosen for SNF.received DCVU-DTHY#[id number],VJK 11/17-fax clinicals [phone].need covid ordered once I know if [facility] has bed available. 1p-[facility] has bed.await covid, & d/c summary.   Expected Discharge Plan: Skilled Nursing Facility Barriers to Discharge: Insurance Authorization  Expected Discharge Plan and Services Expected Discharge Plan: [place]   Discharge Planning Services: CM Consult Post Acute Care Choice: [place] Living arrangements for the past 2 months: Single Family Home                                       Social Determinants of Health (SDOH) Interventions    Readmission Risk Interventions Readmission Risk Prevention Plan 07/05/2020  Transportation Screening Complete  Home Care Screening Complete  Medication Review (RN CM) Complete  Some recent data might be hidden

## 2020-07-06 NOTE — Plan of Care (Signed)

## 2020-07-06 NOTE — TOC Transition Note (Addendum)
Transition of Care [facility]) - CM/SW Discharge Note   Patient Details  Name: Hayley Fisher MRN: [id number] Date of Birth: [birth year]  [facility] Care [facility]) CM/SW Contact:  Dessa Phi, RN Phone Number: 07/06/2020, 2:01 PM   Clinical Narrative: d/c today [place] rep Juliann Pulse aware going to rm#[id number],nsg report tel#[phone]-PTAR called. Nsg to call patient's dtr [name] once Corey Harold arrives-she lives 2 blocks away & will meet patient @ facility-nsg aware.No further CM needs.      Final next level of care: Skilled Nursing Facility Barriers to Discharge: No Barriers Identified   Patient Goals and CMS Choice Patient states their goals for this hospitalization and ongoing recovery are:: to go home CMS Medicare.gov Compare Post Acute Care list provided to:: Patient Represenative (must comment) Choice offered to / list presented to : Adult Children  Discharge Placement                       Discharge Plan and Services   Discharge Planning Services: CM Consult Post Acute Care Choice: Skilled Nursing Facility                               Social Determinants of Health (SDOH) Interventions     Readmission Risk Interventions Readmission Risk Prevention Plan 07/05/2020  Transportation Screening Complete  Home Care Screening Complete  Medication Review (RN CM) Complete  Some recent data might be hidden

## 2020-07-06 NOTE — Progress Notes (Signed)
Physical Therapy Treatment Patient Details Name: Hayley Fisher MRN: [id number] DOB: August 27, 1930 Today's Date: 07/06/2020    History of Present Illness 86 year old female with history of type 2 diabetes on Victoza and Metformin, hypertension, dementia who lives at home with her sister brought to ER with very high blood sugar.    PT Comments    Progressing with mobility. Pt continues to require Mod assist for mobility. Pt is at risk for falls when mobilizing. Continue to recommend SNF.    Follow Up Recommendations  SNF     Equipment Recommendations  None recommended by PT    Recommendations for Other Services       Precautions / Restrictions Precautions Precautions: Fall Restrictions Weight Bearing Restrictions: No    Mobility  Bed Mobility               General bed mobility comments: oob in recliner  Transfers Overall transfer level: Needs assistance Equipment used: Rolling walker (2 wheeled) Transfers: Sit to/from Stand Sit to Stand: Mod assist         General transfer comment: Assist to power up, stabilize, control descent. VCs safety, hand placement. Increased time. Wide BOS. Mild posterior bias.  Ambulation/Gait Ambulation/Gait assistance: Min assist Gait Distance (Feet): 15 Feet (x2) Assistive device: Rolling walker (2 wheeled) Gait Pattern/deviations: Wide base of support;Trunk flexed;Decreased stride length;Step-through pattern;Decreased step length - right;Decreased step length - left     General Gait Details: Cues for safety, posture, RW proximity. Assist to stabilize pt and manage RW. Seated rest break between walks. Pt fatigues easily.   Stairs             Wheelchair Mobility    Modified Rankin (Stroke Patients Only)       Balance Overall balance assessment: Needs assistance         Standing balance support: Bilateral upper extremity supported Standing balance-Leahy Scale: Poor                               Cognition Arousal/Alertness: Awake/alert Behavior During Therapy: WFL for tasks assessed/performed Overall Cognitive Status: No family/caregiver present to determine baseline cognitive functioning                                        Exercises General Exercises - Lower Extremity Long Arc Quad: AROM;Both;10 reps;Seated Hip ABduction/ADduction: AROM;Both;10 reps;Seated Hip Flexion/Marching: AROM;Both;10 reps;Seated    General Comments        Pertinent Vitals/Pain Pain Assessment: No/denies pain    Home Living                      Prior Function            PT Goals (current goals can now be found in the care plan section) Progress towards PT goals: Progressing toward goals    Frequency    Min 3X/week      PT Plan Current plan remains appropriate    Co-evaluation              AM-PAC PT "6 Clicks" Mobility   Outcome Measure  Help needed turning from your back to your side while in a flat bed without using bedrails?: A Little Help needed moving from lying on your back to sitting on the side of a flat bed without using bedrails?: A Lot Help needed  moving to and from a bed to a chair (including a wheelchair)?: A Lot Help needed standing up from a chair using your arms (e.g., wheelchair or bedside chair)?: A Lot Help needed to walk in hospital room?: A Lot Help needed climbing 3-5 steps with a railing? : A Lot 6 Click Score: 13    End of Session Equipment Utilized During Treatment: Gait belt Activity Tolerance: Patient limited by fatigue;Patient tolerated treatment well Patient left: in chair;with call bell/phone within reach;with chair alarm set   PT Visit Diagnosis: Muscle weakness (generalized) (M62.81);Difficulty in walking, not elsewhere classified (R26.2)     Time: 1884-1660 PT Time Calculation (min) (ACUTE ONLY): 13 min  Charges:  $Gait Training: 8-22 mins                         Doreatha Massed, PT Acute Rehabilitation   Office: [phone] Pager: [phone]

## 2020-07-06 NOTE — Progress Notes (Signed)
Pt alert and aware sitting up in chair. Pt's door was open I spoke and introduced myself. She was happy that I stopped by. She said that hated that she was not able to go to church yesterday. She stated that she belongs to a [employer] here in [place]. The chaplain offered caring and supportive presence, prayers and song and blessings. Further wills will be offered.

## 2020-07-09 DIAGNOSIS — E1110 Type 2 diabetes mellitus with ketoacidosis without coma: Secondary | ICD-10-CM | POA: Diagnosis not present

## 2020-07-09 DIAGNOSIS — G301 Alzheimer's disease with late onset: Secondary | ICD-10-CM | POA: Diagnosis not present

## 2020-07-09 DIAGNOSIS — N390 Urinary tract infection, site not specified: Secondary | ICD-10-CM | POA: Diagnosis not present

## 2020-07-09 DIAGNOSIS — I10 Essential (primary) hypertension: Secondary | ICD-10-CM | POA: Diagnosis not present

## 2020-07-21 DIAGNOSIS — E1165 Type 2 diabetes mellitus with hyperglycemia: Secondary | ICD-10-CM | POA: Diagnosis not present

## 2020-07-21 DIAGNOSIS — M6281 Muscle weakness (generalized): Secondary | ICD-10-CM | POA: Diagnosis not present

## 2020-07-21 DIAGNOSIS — R262 Difficulty in walking, not elsewhere classified: Secondary | ICD-10-CM | POA: Diagnosis not present

## 2020-07-21 DIAGNOSIS — R54 Age-related physical debility: Secondary | ICD-10-CM | POA: Diagnosis not present

## 2020-07-22 ENCOUNTER — Other Ambulatory Visit: Payer: Medicare Other

## 2020-07-23 DIAGNOSIS — I10 Essential (primary) hypertension: Secondary | ICD-10-CM | POA: Diagnosis not present

## 2020-07-23 DIAGNOSIS — M6281 Muscle weakness (generalized): Secondary | ICD-10-CM | POA: Diagnosis not present

## 2020-07-23 DIAGNOSIS — E1165 Type 2 diabetes mellitus with hyperglycemia: Secondary | ICD-10-CM | POA: Diagnosis not present

## 2020-07-23 DIAGNOSIS — E785 Hyperlipidemia, unspecified: Secondary | ICD-10-CM | POA: Diagnosis not present

## 2020-07-25 DIAGNOSIS — I10 Essential (primary) hypertension: Secondary | ICD-10-CM | POA: Diagnosis not present

## 2020-07-25 DIAGNOSIS — E785 Hyperlipidemia, unspecified: Secondary | ICD-10-CM | POA: Diagnosis not present

## 2020-07-25 DIAGNOSIS — Z9181 History of falling: Secondary | ICD-10-CM | POA: Diagnosis not present

## 2020-07-25 DIAGNOSIS — E1110 Type 2 diabetes mellitus with ketoacidosis without coma: Secondary | ICD-10-CM | POA: Diagnosis not present

## 2020-07-25 DIAGNOSIS — E1165 Type 2 diabetes mellitus with hyperglycemia: Secondary | ICD-10-CM | POA: Diagnosis not present

## 2020-07-25 DIAGNOSIS — Z87891 Personal history of nicotine dependence: Secondary | ICD-10-CM | POA: Diagnosis not present

## 2020-07-25 DIAGNOSIS — N390 Urinary tract infection, site not specified: Secondary | ICD-10-CM | POA: Diagnosis not present

## 2020-07-25 DIAGNOSIS — M1990 Unspecified osteoarthritis, unspecified site: Secondary | ICD-10-CM | POA: Diagnosis not present

## 2020-07-25 DIAGNOSIS — Z7984 Long term (current) use of oral hypoglycemic drugs: Secondary | ICD-10-CM | POA: Diagnosis not present

## 2020-07-25 DIAGNOSIS — Z794 Long term (current) use of insulin: Secondary | ICD-10-CM | POA: Diagnosis not present

## 2020-07-26 ENCOUNTER — Telehealth: Payer: Self-pay | Admitting: Family Medicine

## 2020-07-26 ENCOUNTER — Other Ambulatory Visit: Payer: Self-pay | Admitting: Family Medicine

## 2020-07-26 NOTE — Telephone Encounter (Signed)
Received Hayley Fisher from [facility] asking whether I can give a verbal order for home health nurse to go to patient's home.   [name] MD PGY-2, [place] [facility]

## 2020-07-27 ENCOUNTER — Emergency Department (HOSPITAL_COMMUNITY): Payer: Medicare Other

## 2020-07-27 ENCOUNTER — Encounter (HOSPITAL_COMMUNITY): Payer: Self-pay

## 2020-07-27 ENCOUNTER — Emergency Department (HOSPITAL_COMMUNITY)
Admission: EM | Admit: 2020-07-27 | Discharge: 2020-07-28 | Disposition: A | Discharge status: Home / Self Care | Payer: Medicare Other | Attending: Emergency Medicine | Admitting: Emergency Medicine

## 2020-07-27 ENCOUNTER — Other Ambulatory Visit: Payer: Self-pay

## 2020-07-27 DIAGNOSIS — Z7982 Long term (current) use of aspirin: Secondary | ICD-10-CM | POA: Diagnosis not present

## 2020-07-27 DIAGNOSIS — R110 Nausea: Secondary | ICD-10-CM | POA: Diagnosis not present

## 2020-07-27 DIAGNOSIS — R1013 Epigastric pain: Secondary | ICD-10-CM | POA: Diagnosis not present

## 2020-07-27 DIAGNOSIS — Z794 Long term (current) use of insulin: Secondary | ICD-10-CM | POA: Diagnosis not present

## 2020-07-27 DIAGNOSIS — N3090 Cystitis, unspecified without hematuria: Secondary | ICD-10-CM

## 2020-07-27 DIAGNOSIS — Z7984 Long term (current) use of oral hypoglycemic drugs: Secondary | ICD-10-CM | POA: Diagnosis not present

## 2020-07-27 DIAGNOSIS — I129 Hypertensive chronic kidney disease with stage 1 through stage 4 chronic kidney disease, or unspecified chronic kidney disease: Secondary | ICD-10-CM | POA: Insufficient documentation

## 2020-07-27 DIAGNOSIS — Z87891 Personal history of nicotine dependence: Secondary | ICD-10-CM | POA: Diagnosis not present

## 2020-07-27 DIAGNOSIS — R739 Hyperglycemia, unspecified: Secondary | ICD-10-CM | POA: Diagnosis present

## 2020-07-27 DIAGNOSIS — L89151 Pressure ulcer of sacral region, stage 1: Secondary | ICD-10-CM | POA: Insufficient documentation

## 2020-07-27 DIAGNOSIS — E1140 Type 2 diabetes mellitus with diabetic neuropathy, unspecified: Secondary | ICD-10-CM | POA: Insufficient documentation

## 2020-07-27 DIAGNOSIS — E1165 Type 2 diabetes mellitus with hyperglycemia: Secondary | ICD-10-CM | POA: Diagnosis not present

## 2020-07-27 DIAGNOSIS — K5900 Constipation, unspecified: Secondary | ICD-10-CM | POA: Diagnosis not present

## 2020-07-27 DIAGNOSIS — R6889 Other general symptoms and signs: Secondary | ICD-10-CM | POA: Diagnosis not present

## 2020-07-27 DIAGNOSIS — Z743 Need for continuous supervision: Secondary | ICD-10-CM | POA: Diagnosis not present

## 2020-07-27 DIAGNOSIS — E1122 Type 2 diabetes mellitus with diabetic chronic kidney disease: Secondary | ICD-10-CM | POA: Insufficient documentation

## 2020-07-27 DIAGNOSIS — N189 Chronic kidney disease, unspecified: Secondary | ICD-10-CM | POA: Diagnosis not present

## 2020-07-27 DIAGNOSIS — R109 Unspecified abdominal pain: Secondary | ICD-10-CM | POA: Diagnosis not present

## 2020-07-27 DIAGNOSIS — I499 Cardiac arrhythmia, unspecified: Secondary | ICD-10-CM | POA: Diagnosis not present

## 2020-07-27 LAB — BASIC METABOLIC PANEL
Anion gap: 14 (ref 5–15)
BUN: 16 mg/dL (ref 8–23)
CO2: 26 mmol/L (ref 22–32)
Calcium: 9.2 mg/dL (ref 8.9–10.3)
Chloride: 97 mmol/L — ABNORMAL LOW (ref 98–111)
Creatinine, Ser: 0.5 mg/dL (ref 0.44–1.00)
GFR, Estimated: >60 mL/min (ref >60)
Glucose, Bld: 286 mg/dL — ABNORMAL HIGH (ref 70–99)
Potassium: 4 mmol/L (ref 3.5–5.1)
Sodium: 137 mmol/L (ref 135–145)

## 2020-07-27 LAB — CBC
HCT: 35.2 % — ABNORMAL LOW (ref 36.0–46.0)
Hemoglobin: 11.4 g/dL — ABNORMAL LOW (ref 12.0–15.0)
MCH: 31.8 pg (ref 26.0–34.0)
MCHC: 32.4 g/dL (ref 30.0–36.0)
MCV: 98.3 fL (ref 80.0–100.0)
Platelets: 210 10*3/uL (ref 150–400)
RBC: 3.58 MIL/uL — ABNORMAL LOW (ref 3.87–5.11)
RDW: 13.8 % (ref 11.5–15.5)
WBC: 2.5 10*3/uL — ABNORMAL LOW (ref 4.0–10.5)
nRBC: 0 % (ref 0.0–0.2)

## 2020-07-27 LAB — CBG MONITORING, ED
Glucose-Capillary: 235 mg/dL — ABNORMAL HIGH (ref 70–99)
Glucose-Capillary: 286 mg/dL — ABNORMAL HIGH (ref 70–99)

## 2020-07-27 MED ORDER — IOHEXOL 300 MG/ML  SOLN
100.0000 mL | Freq: Once | INTRAMUSCULAR | Status: AC | PRN
  Administered 2020-07-27: 100 mL via INTRAVENOUS

## 2020-07-27 NOTE — ED Triage Notes (Addendum)
Per EMS- Patient c/o abdominal pain, intermittent nausea, and hyperglycemia x 4-5 days. EMS [id number]. Patient received 500 ml NS prior to arrival to the ED.  During triage, the patient states she has pain on her tailbone from an ulcer. Patient states 10/10 pain

## 2020-07-27 NOTE — ED Notes (Signed)
Pt placed on purewick at 80 mmHg.  

## 2020-07-27 NOTE — ED Provider Notes (Signed)
[place] DEPT Provider Note   CSN: [id number] Arrival date & time: 07/27/20  1656     History Chief Complaint  Patient presents with  . Hyperglycemia  . Abdominal Pain  . coccyx ulcer    Hayley Fisher is a 86 y.o. female.  The history is provided by the patient and medical records.  Hyperglycemia Associated symptoms: abdominal pain and nausea   Abdominal Pain Associated symptoms: nausea     86 year old female with history of arthritis, chronic kidney disease, diabetes, hyperlipidemia, hypertension, presenting to the ED with multiple concerns.  1.  Hyperglycemia--admission for same in November.  Medications have been adjusted (started victoza) but sugars have still been spiking.  Does report eating cereal and pizza today which may have contributed.  Admit to urinary frequency but denies any dysuria or hematuria.  2.  Abdominal pain-- started over the past 2 days.  Epigastric and wrap around her side.  She has had nausea without vomiting.  Able to eat/drink without issue.  No diarrhea.    3.  decubitus ulcer-- stage I, no opening, bleeding, or purulent drainage.  States she was given barrier cream to use but area is still quite sore.  Past Medical History:  Diagnosis Date  . Arthritis   . Arthritis   . Cataract    bil cateracts removed  . Chronic kidney disease    "spot on one of my kidneys" per pt  . Diabetes mellitus   . Glaucoma   . Hyperlipidemia   . Hyperplastic colon polyp   . Hypertension   . Syncope     Patient Active Problem List   Diagnosis Date Noted  . Uncontrolled type 2 diabetes mellitus with hyperglycemia ([place]) 07/02/2020  . Metabolic acidosis 56/43/3295  . UTI (urinary tract infection) 07/02/2020  . Type 2 diabetes mellitus with hyperosmolar hyperglycemic state (HHS) ([place]) 07/02/2020  . Hyperkalemia 07/02/2020  . Vaginal candidiasis 06/21/2020  . Atrophic vaginitis 06/21/2020  . Skin ulcer of sacrum, limited to  breakdown of skin ([place]) 03/16/2017  . Chronic venous insufficiency 11/09/2015  . DDD (degenerative disc disease), lumbosacral 11/24/2014  . Type 2 diabetes mellitus with diabetic neuropathy ([place]) 10/19/2006  . Hyperlipidemia associated with type 2 diabetes mellitus ([place]) 10/19/2006  . HYPERTENSION, BENIGN SYSTEMIC 10/19/2006  . Osteopenia 10/19/2006    Past Surgical History:  Procedure Laterality Date  . ABDOMINAL HYSTERECTOMY    . BLADDER SUSPENSION    . bladder tacking    . COLONOSCOPY    . EYE SURGERY    . INGUINAL HERNIA REPAIR Right      OB History   No obstetric history on file.     Family History  Problem Relation Age of Onset  . Colon cancer Mother   . Other Father        cerebral hemorrhage  . Diabetes Brother   . Diabetes Sister        x 2  . Bone cancer Daughter   . Breast cancer Daughter 80  . Esophageal cancer Neg Hx   . Rectal cancer Neg Hx   . Stomach cancer Neg Hx     Social History   Tobacco Use  . Smoking status: Former Smoker    Types: Cigarettes    Quit date: 11/29/1980    Years since quitting: 39.6  . Smokeless tobacco: Never Used  Vaping Use  . Vaping Use: Never used  Substance Use Topics  . Alcohol use: No    Alcohol/week: 0.0 standard  drinks  . Drug use: No    Home Medications Prior to Admission medications   Medication Sig Start Date End Date Taking? Authorizing Provider  ACCU-CHEK AVIVA PLUS test strip USE TO TEST BLOOD SUGAR UP TO 3 TIMES A DAY. 06/30/20   Mullis, Kiersten P, DO  Accu-Chek Softclix Lancets lancets USE TO TEST BLOOD SUGAR UP TO 3 TIMES A DAY. 06/30/20   Mullis, Kiersten P, DO  aspirin 81 MG chewable tablet Chew 81 mg by mouth every morning.     [provider]  atorvastatin (LIPITOR) 40 MG tablet Take 1 tablet (40 mg total) by mouth daily. 07/24/19   Milus Banister C, DO  brimonidine (ALPHAGAN P) 0.1 % SOLN Place 1 drop into the left eye 2 (two) times daily.  09/20/11     carbamide peroxide (DEBROX) 6.5 %  OTIC solution Place 5 drops into both ears 2 (two) times daily. Patient taking differently: Place 2 drops into both ears 2 (two) times daily.  10/16/18   Alveda Reasons, MD  dorzolamide-timolol (COSOPT) 22.3-6.8 MG/ML ophthalmic solution Place 1 drop into the left eye 2 (two) times daily. 05/18/20   [provider]  DULoxetine (CYMBALTA) 60 MG capsule TAKE 1 CAPSULE BY MOUTH EVERY DAY Patient taking differently: Take 60 mg by mouth daily.  10/22/18   Harriet Butte, DO  enalapril (VASOTEC) 20 MG tablet TAKE 1 TABLET BY MOUTH EVERY DAY Patient taking differently: Take 20 mg by mouth daily.  06/17/20   Mullis, Kiersten P, DO  insulin aspart (NOVOLOG) 100 UNIT/ML injection Inject 0-9 Units into the skin 3 (three) times daily with meals. 07/06/20   Barb Merino, MD  insulin glargine (LANTUS) 100 UNIT/ML injection Inject 0.15 mLs (15 Units total) into the skin daily. 07/07/20   Barb Merino, MD  metFORMIN (GLUCOPHAGE-XR) 500 MG 24 hr tablet TAKE 2 TABLETS BY MOUTH 2 TIMES DAILY Patient taking differently: Take 1,000 mg by mouth in the morning and at bedtime.  03/26/20   Mullis, Kiersten P, DO  VYZULTA 0.024 % SOLN Place 1 drop into the right eye daily. 11/06/17   [provider]  Accu-Chek Softclix Lancets lancets USE TO TEST BLOOD SUGAR UP  TO 3 TIMES A DAY. 05/17/19   Mullis, Kiersten P, DO  glucose blood (ACCU-CHEK AVIVA PLUS) test strip USE TO TEST BLOOD SUGAR UP  TO 3 TIMES A DAY. 05/17/19   Mullis, Kiersten P, DO    Allergies    Levemir [insulin detemir]  Review of Systems   Review of Systems  Gastrointestinal: Positive for abdominal pain and nausea.  Skin: Positive for wound.  All other systems reviewed and are negative.   Physical Exam Updated Vital Signs BP 127/73 (BP Location: Left Arm)   Pulse 100   Temp 98.2 F (36.8 C) (Oral)   Resp 17   Ht 5\' 1"  (1.549 m)   Wt 54.4 kg   SpO2 97%   BMI 22.67 kg/m   Physical Exam Vitals and nursing note reviewed.   Constitutional:      Appearance: She is well-developed.  HENT:     Head: Normocephalic and atraumatic.  Eyes:     Conjunctiva/sclera: Conjunctivae normal.     Pupils: Pupils are equal, round, and reactive to light.  Cardiovascular:     Rate and Rhythm: Normal rate and regular rhythm.     Heart sounds: Normal heart sounds.  Pulmonary:     Effort: Pulmonary effort is normal.     Breath sounds:  Normal breath sounds.  Abdominal:     General: Bowel sounds are normal.     Palpations: Abdomen is soft.     Tenderness: There is abdominal tenderness in the right upper quadrant and epigastric area.       Comments: Mild epigastric tenderness > RUQ, no murphy's sign  Genitourinary:   Musculoskeletal:        General: Normal range of motion.     Cervical back: Normal range of motion.  Skin:    General: Skin is warm and dry.  Neurological:     Mental Status: She is alert and oriented to person, place, and time.     ED Results / Procedures / Treatments   Labs (all labs ordered are listed, but only abnormal results are displayed) Labs Reviewed  BASIC METABOLIC PANEL - Abnormal; Notable for the following components:      Result Value   Chloride 97 (*)    Glucose, Bld 286 (*)    All other components within normal limits  CBC - Abnormal; Notable for the following components:   WBC 2.5 (*)    RBC 3.58 (*)    Hemoglobin 11.4 (*)    HCT 35.2 (*)    All other components within normal limits  URINALYSIS, ROUTINE W REFLEX MICROSCOPIC - Abnormal; Notable for the following components:   APPearance CLOUDY (*)    Glucose, UA >=500 (*)    Hgb urine dipstick SMALL (*)    Ketones, ur 5 (*)    Leukocytes,Ua LARGE (*)    WBC, UA >50 (*)    Bacteria, UA RARE (*)    All other components within normal limits  CBG MONITORING, ED - Abnormal; Notable for the following components:   Glucose-Capillary 235 (*)    All other components within normal limits  CBG MONITORING, ED - Abnormal; Notable for  the following components:   Glucose-Capillary 286 (*)    All other components within normal limits  HEPATIC FUNCTION PANEL  LIPASE, BLOOD  CBG MONITORING, ED    EKG None  Radiology CT ABDOMEN PELVIS W CONTRAST  Result Date: 07/27/2020 CLINICAL DATA:  Abdominal pain and epigastric pain EXAM: CT ABDOMEN AND PELVIS WITH CONTRAST TECHNIQUE: Multidetector CT imaging of the abdomen and pelvis was performed using the standard protocol following bolus administration of intravenous contrast. CONTRAST:  115mL OMNIPAQUE IOHEXOL 300 MG/ML  SOLN COMPARISON:  None. FINDINGS: Lower chest: The visualized heart size within normal limits. No pericardial fluid/thickening. No hiatal hernia. The visualized portions of the lungs are clear. Hepatobiliary: The liver is normal in density without focal abnormality.The main portal vein is patent. No evidence of calcified gallstones, gallbladder wall thickening or biliary dilatation. Pancreas: Unremarkable. No pancreatic ductal dilatation or surrounding inflammatory changes. Spleen: Normal in size without focal abnormality. Adrenals/Urinary Tract: Both adrenal glands appear normal. There is a 6 cm low-density lesion seen within the upper pole of the left kidney. The right kidney is unremarkable. The bladder is fluid-filled and mildly distended. Stomach/Bowel: The stomach is unremarkable. Of there appears to be some fecalization of the distal ileal loops. A large amount colonic stool is seen throughout to the level the rectum. Vascular/Lymphatic: There are no enlarged mesenteric, retroperitoneal, or pelvic lymph nodes. Scattered aortic atherosclerotic calcifications are seen without aneurysmal dilatation. Reproductive: The patient is status post hysterectomy. No adnexal masses is seen. There is a small amount of complex fluid again seen within the vaginal cuff dating back to 2019. Other: No evidence of abdominal wall mass  or hernia. Mild diffuse anasarca seen. Musculoskeletal: No  acute or significant osseous findings. Degenerative changes seen throughout the thoracolumbar spine. IMPRESSION: Large amount of colonic stool seen throughout. No definite evidence of obstruction. Aortic Atherosclerosis (ICD10-I70.0). Electronically Signed   By: Prudencio Pair M.D.   On: 07/27/2020 22:59    Procedures Procedures (including critical care time)  Medications Ordered in ED Medications  iohexol (OMNIPAQUE) 300 MG/ML solution 100 mL (100 mLs Intravenous Contrast Given 07/27/20 2244)    ED Course  I have reviewed the triage vital signs and the nursing notes.  Pertinent labs & imaging results that were available during my care of the patient were reviewed by me and considered in my medical decision making (see chart for details).    MDM Rules/Calculators/A&P  86 year old female presenting to the ED with multiple complaints.  1.  Hyperglycemia-- recent admission last month for same.  Medications have been adjusted.  Still having spikes in blood sugar.  Does admit to eating cereal and pizza today.  Does have mild hyperglycemia here at 286, but normal bicarb and anion gap.  Clinically not concerning for DKA.  Has already received  77cc NS with EMS.  Continue home regimen, carb modified diet.  2.  Abdominal pain-- states epigastric w/nausea but no vomiting.  No diarrhea.  No fever/chills.  Urinary frequency reported, however this may be due to her hyperglycemia.  It appears she was recently started on Victoza which has possibility to cause pancreatitis.  Initial labs reassuring.  Added LFT's and lipase which are normal.  CT with findings of stool burden, encouraged stool softener if needed.Marland Kitchen  UA does have bacteria, > 50 WBC.  Given urinary symptoms, will treat with course of keflex.  3.  Decubitus ulcer-- stage 1 along gluteal cleft.  Skin remains closed without bleeding or signs of infection.  No acute findings on CT.  Has been using barrier cream without relief.  Will be given patch to  use for added skin protection.  Continue good wound care and frequent position changes at home.  Close follow-up with PCP encouraged.  Return here for any new/acute changes.  Final Clinical Impression(s) / ED Diagnoses Final diagnoses:  Hyperglycemia  Epigastric pain  Cystitis    Rx / DC Orders ED Discharge Orders         Ordered    cephALEXin (KEFLEX) 500 MG capsule  2 times daily        07/28/20 0040    ondansetron (ZOFRAN ODT) 4 MG disintegrating tablet  Every 8 hours PRN        07/28/20 0040           Larene Pickett, PA-C 07/28/20 0124    Merryl Hacker, MD 07/28/20 [phone]

## 2020-07-28 LAB — HEPATIC FUNCTION PANEL
ALT: 22 U/L (ref 0–44)
AST: 25 U/L (ref 15–41)
Albumin: 4 g/dL (ref 3.5–5.0)
Alkaline Phosphatase: 53 U/L (ref 38–126)
Bilirubin, Direct: 0.1 mg/dL (ref 0.0–0.2)
Indirect Bilirubin: 0.7 mg/dL (ref 0.3–0.9)
Total Bilirubin: 0.8 mg/dL (ref 0.3–1.2)
Total Protein: 6.8 g/dL (ref 6.5–8.1)

## 2020-07-28 LAB — URINALYSIS, ROUTINE W REFLEX MICROSCOPIC
Bilirubin Urine: NEGATIVE
Glucose, UA: >=500 mg/dL — AB
Ketones, ur: 5 mg/dL — AB
Nitrite: NEGATIVE
Protein, ur: NEGATIVE mg/dL
Specific Gravity, Urine: 1.014 (ref 1.005–1.030)
WBC, UA: >50 WBC/hpf — ABNORMAL HIGH (ref 0–5)
pH: 5 (ref 5.0–8.0)

## 2020-07-28 LAB — LIPASE, BLOOD: Lipase: 18 U/L (ref 11–51)

## 2020-07-28 MED ORDER — CEPHALEXIN 500 MG PO CAPS: 500.0000 mg | ORAL_CAPSULE | Freq: Two times a day (BID) | ORAL | 0 refills | Status: DC

## 2020-07-28 MED ORDER — ONDANSETRON 4 MG PO TBDP: 4.0000 mg | ORAL_TABLET | Freq: Three times a day (TID) | ORAL | 0 refills | Status: DC | PRN

## 2020-07-28 NOTE — Telephone Encounter (Signed)
Not entirely sure why you received the message. But yes this is appropriate. Please let me know if you need me to do anything.

## 2020-07-28 NOTE — Discharge Instructions (Signed)
Labs today looked good.  CT did show some stool back up, can try stool softener if you like or increase vegetables/fiber.  Urine did show some infection. Can try the patch on the buttock to see if this helps sore area. Take the prescribed medication as directed.  Can take probiotic with antibiotic. Follow-up with your primary care doctor. Return to the ED for new or worsening symptoms.

## 2020-07-29 ENCOUNTER — Other Ambulatory Visit: Payer: Self-pay

## 2020-07-29 ENCOUNTER — Encounter: Payer: Self-pay | Admitting: Family Medicine

## 2020-07-29 ENCOUNTER — Other Ambulatory Visit: Payer: Self-pay | Admitting: Family Medicine

## 2020-07-29 ENCOUNTER — Ambulatory Visit (INDEPENDENT_AMBULATORY_CARE_PROVIDER_SITE_OTHER): Payer: Medicare Other | Admitting: Family Medicine

## 2020-07-29 ENCOUNTER — Telehealth: Payer: Self-pay

## 2020-07-29 VITALS — BP 165/80 | HR 100 | Wt 128.2 lb

## 2020-07-29 DIAGNOSIS — Z9181 History of falling: Secondary | ICD-10-CM | POA: Diagnosis not present

## 2020-07-29 DIAGNOSIS — I152 Hypertension secondary to endocrine disorders: Secondary | ICD-10-CM

## 2020-07-29 DIAGNOSIS — E1140 Type 2 diabetes mellitus with diabetic neuropathy, unspecified: Secondary | ICD-10-CM

## 2020-07-29 DIAGNOSIS — E1159 Type 2 diabetes mellitus with other circulatory complications: Secondary | ICD-10-CM

## 2020-07-29 DIAGNOSIS — Z794 Long term (current) use of insulin: Secondary | ICD-10-CM

## 2020-07-29 DIAGNOSIS — Z7984 Long term (current) use of oral hypoglycemic drugs: Secondary | ICD-10-CM | POA: Diagnosis not present

## 2020-07-29 DIAGNOSIS — N3000 Acute cystitis without hematuria: Secondary | ICD-10-CM

## 2020-07-29 DIAGNOSIS — E1110 Type 2 diabetes mellitus with ketoacidosis without coma: Secondary | ICD-10-CM | POA: Diagnosis not present

## 2020-07-29 DIAGNOSIS — E785 Hyperlipidemia, unspecified: Secondary | ICD-10-CM | POA: Diagnosis not present

## 2020-07-29 DIAGNOSIS — M1990 Unspecified osteoarthritis, unspecified site: Secondary | ICD-10-CM | POA: Diagnosis not present

## 2020-07-29 DIAGNOSIS — E1165 Type 2 diabetes mellitus with hyperglycemia: Secondary | ICD-10-CM | POA: Diagnosis not present

## 2020-07-29 DIAGNOSIS — Z87891 Personal history of nicotine dependence: Secondary | ICD-10-CM | POA: Diagnosis not present

## 2020-07-29 DIAGNOSIS — L89301 Pressure ulcer of unspecified buttock, stage 1: Secondary | ICD-10-CM | POA: Diagnosis not present

## 2020-07-29 DIAGNOSIS — I10 Essential (primary) hypertension: Secondary | ICD-10-CM | POA: Diagnosis not present

## 2020-07-29 DIAGNOSIS — N390 Urinary tract infection, site not specified: Secondary | ICD-10-CM | POA: Diagnosis not present

## 2020-07-29 MED ORDER — INSULIN GLARGINE 100 UNIT/ML SOLOSTAR PEN: 15.0000 [IU] | PEN_INJECTOR | SUBCUTANEOUS | 0 refills | Status: DC

## 2020-07-29 NOTE — Progress Notes (Signed)
SUBJECTIVE:   CHIEF COMPLAINT / HPI: Follow-up hospitalization/ED visit/endocrinology referral  Hayley Fisher is an 86 year old female presenting with her daughter to discuss the following:  Hospitalization/ED visit follow-up: Admitted for DKA from 11/11-11/15 with CBGs over 600 on arrival.  Unclear etiology.  Managed with DKA protocol and discharged to SNF.  She was discharged home with Lantus, however has not been taking this and is continued her Victoza/Metformin.  She was seen again in the ED on 12/6 for hyperglycemia and stage I decubitus ulcer.  No evidence of DKA/HHS.  CT abdomen without any acute findings.  Started on Keflex for urinary symptoms, reports these symptoms have now resolved.  Diabetes:  Still having elevated sugars and intermittently elevated blood pressures (after having trouble with lower ones before), would like referral to endocrinology. Ever since starting Victoza in 05/2020, sugars have been elevated. Anywhere from 200-400s before meals.  Prior to switching to Victoza, she was on Lantus 10 units and metformin 2000 mg daily.  Reports that her CBGs were under reasonable control with this regimen, however had gotten slightly higher than before in the past several months to what she suspects was dietary indiscretions.  States " I have eaten what I want in the past few months."  She often feels nauseous with the Victoza which has become increasingly bothersome to her, however still eating as normal.  Ulcer: Stage I.  She reports that the decubitus ulcer has not improved, however has not worsened in severity.  Has not noticed any signs of infection.  Daughter is placing a barrier cream every day.  They have not been using any pads on the area.   Hypertension: Daughter is worried that her blood pressure is elevated today.  She has been taking enalapril 20 mg at home.  Has a BP cuff at home, but has not checked yet.  She denies any vision changes, lightheadedness/dizziness, chest  pain, headache, shortness of breath.  PERTINENT  PMH / PSH: Hyperlipidemia, T2DM, chronic venous insufficiency, hypertension, osteopenia  OBJECTIVE:   BP (!) 165/80   Pulse 100   Wt 128 lb 3.2 oz (58.2 kg)   SpO2 98%   BMI 24.22 kg/m   General: Alert, NAD HEENT: NCAT, MMM Lungs: No increased WOB  Abdomen: soft Msk: Moves all extremities spontaneously, gait with rolling walker   Ext: Warm, dry, 2+ distal pulses, no edema  Derm: mild superficial skin abrasion without breakdown (approximately 1.5cm, stage I) with barrier cream overlying region present in superior intergluteal cleft. No surrounding erythema, drainage, or warmth to touch.   ASSESSMENT/PLAN:   Type 2 diabetes mellitus with diabetic neuropathy (HCC) A1c 10.2 in 05/2020, poorly controlled currently. Due to patient intolerance and subjectively erratic CBGS, will discontinue Victoza and restart Lantus. Rx'd Lantus pen 15 U qam. Instructed to monitor fasting/2-hr postprandial glucose and keep a journal, bring into next visit. Encouraged balancing diet for further A1c reduction. Maintain metformin XR 1000 BID as is. Placed endocrine referral per family request.   UTI (urinary tract infection) Currently on Keflex with symptomatic resolution. Cont to monitor.   Hypertension associated with diabetes ([place]) Elevated today, 165/80, however goal for patient would be optimally be around 140-150/90. Given age, history of orthostatic hypotension with pre-syncope, and limited mobility--would not strive towards more stringent goal. Discussed monitoring BP at home and keeping journal to bring in at next visit. Will keep Enalapril 20mg  as is for now.    Stage 1 decubitus ulcer No s/sx of infection on exam.  Encouraged frequent position movements to avoid constant pressure. Continue barrier cream and start placing nonadherent padding for support and protection. Cont to monitor.     F/u in 1-2 weeks with journals in hand for above concerns,  sooner if needed.   Hayley Fisher, [place]

## 2020-07-29 NOTE — Telephone Encounter (Signed)
Hayley Fisher, [place] OT, calls nurse line requesting VO for [facility] OT as follows:  1x a week for 1 week  2x a week for 1 week  1x a week for 3 weeks  Verbal given per [facility] protocol.

## 2020-07-29 NOTE — Telephone Encounter (Signed)
Patients daughter calls nurse line reporting Lantus was supposed to be called in today. Will forward to [place] who saw patient.

## 2020-07-29 NOTE — Patient Instructions (Signed)
It was wonderful to see you both today.  For her blood pressure: Continue to take the enalapril daily.  Please check her blood pressure daily and keep a journal of this and bring this into next visit.  Ultimate goal for her would be around 140-150/80-90.   For her sugar: Please continue to check her sugar fasting before she eats and 2 hours after her largest meal and keep a journal of these numbers as well.   For ulcer: You can continue to use the cream on it and make sure that it stays clean.  You can place the nonadherent pad for some extra cushion and protection.  Please follow-up in the next 1-2 weeks to check in.

## 2020-07-30 ENCOUNTER — Encounter: Payer: Self-pay | Admitting: Family Medicine

## 2020-07-30 DIAGNOSIS — M1990 Unspecified osteoarthritis, unspecified site: Secondary | ICD-10-CM | POA: Diagnosis not present

## 2020-07-30 DIAGNOSIS — N390 Urinary tract infection, site not specified: Secondary | ICD-10-CM | POA: Diagnosis not present

## 2020-07-30 DIAGNOSIS — Z794 Long term (current) use of insulin: Secondary | ICD-10-CM | POA: Diagnosis not present

## 2020-07-30 DIAGNOSIS — E1165 Type 2 diabetes mellitus with hyperglycemia: Secondary | ICD-10-CM | POA: Diagnosis not present

## 2020-07-30 DIAGNOSIS — E785 Hyperlipidemia, unspecified: Secondary | ICD-10-CM | POA: Diagnosis not present

## 2020-07-30 DIAGNOSIS — Z87891 Personal history of nicotine dependence: Secondary | ICD-10-CM | POA: Diagnosis not present

## 2020-07-30 DIAGNOSIS — Z7984 Long term (current) use of oral hypoglycemic drugs: Secondary | ICD-10-CM | POA: Diagnosis not present

## 2020-07-30 DIAGNOSIS — Z9181 History of falling: Secondary | ICD-10-CM | POA: Diagnosis not present

## 2020-07-30 DIAGNOSIS — L8991 Pressure ulcer of unspecified site, stage 1: Secondary | ICD-10-CM | POA: Insufficient documentation

## 2020-07-30 DIAGNOSIS — I10 Essential (primary) hypertension: Secondary | ICD-10-CM | POA: Diagnosis not present

## 2020-07-30 DIAGNOSIS — E1110 Type 2 diabetes mellitus with ketoacidosis without coma: Secondary | ICD-10-CM | POA: Diagnosis not present

## 2020-07-30 NOTE — Assessment & Plan Note (Signed)
Currently on Keflex with symptomatic resolution. Cont to monitor.

## 2020-07-30 NOTE — Assessment & Plan Note (Addendum)
Elevated today, 165/80, however goal for patient would be optimally be around 140-150/90. Given age, history of orthostatic hypotension with pre-syncope, and limited mobility--would not strive towards more stringent goal. Discussed monitoring BP at home and keeping journal to bring in at next visit. Will keep Enalapril 20mg  as is for now.

## 2020-07-30 NOTE — Assessment & Plan Note (Signed)
No s/sx of infection on exam. Encouraged frequent position movements to avoid constant pressure. Continue barrier cream and start placing nonadherent padding for support and protection. Cont to monitor.

## 2020-07-30 NOTE — Assessment & Plan Note (Addendum)
A1c 10.2 in 05/2020, poorly controlled currently. Due to patient intolerance and subjectively erratic CBGS, will discontinue Victoza and restart Lantus. Rx'd Lantus pen 15 U qam. Instructed to monitor fasting/2-hr postprandial glucose and keep a journal, bring into next visit. Encouraged balancing diet for further A1c reduction. Maintain metformin XR 1000 BID as is. Placed endocrine referral per family request.

## 2020-07-31 ENCOUNTER — Telehealth: Payer: Self-pay

## 2020-07-31 DIAGNOSIS — E1165 Type 2 diabetes mellitus with hyperglycemia: Secondary | ICD-10-CM | POA: Diagnosis not present

## 2020-07-31 DIAGNOSIS — Z7984 Long term (current) use of oral hypoglycemic drugs: Secondary | ICD-10-CM | POA: Diagnosis not present

## 2020-07-31 DIAGNOSIS — M1990 Unspecified osteoarthritis, unspecified site: Secondary | ICD-10-CM | POA: Diagnosis not present

## 2020-07-31 DIAGNOSIS — N390 Urinary tract infection, site not specified: Secondary | ICD-10-CM | POA: Diagnosis not present

## 2020-07-31 DIAGNOSIS — Z794 Long term (current) use of insulin: Secondary | ICD-10-CM | POA: Diagnosis not present

## 2020-07-31 DIAGNOSIS — I10 Essential (primary) hypertension: Secondary | ICD-10-CM | POA: Diagnosis not present

## 2020-07-31 DIAGNOSIS — Z9181 History of falling: Secondary | ICD-10-CM | POA: Diagnosis not present

## 2020-07-31 DIAGNOSIS — E1110 Type 2 diabetes mellitus with ketoacidosis without coma: Secondary | ICD-10-CM | POA: Diagnosis not present

## 2020-07-31 DIAGNOSIS — E785 Hyperlipidemia, unspecified: Secondary | ICD-10-CM | POA: Diagnosis not present

## 2020-07-31 DIAGNOSIS — Z87891 Personal history of nicotine dependence: Secondary | ICD-10-CM | POA: Diagnosis not present

## 2020-07-31 NOTE — Telephone Encounter (Signed)
Janett Billow from [place] calling for nursing evaluation verbal orders.  Verbal orders given per [facility] protocol  Talbot Grumbling, RN

## 2020-08-03 DIAGNOSIS — E785 Hyperlipidemia, unspecified: Secondary | ICD-10-CM | POA: Diagnosis not present

## 2020-08-03 DIAGNOSIS — Z7984 Long term (current) use of oral hypoglycemic drugs: Secondary | ICD-10-CM | POA: Diagnosis not present

## 2020-08-03 DIAGNOSIS — E1165 Type 2 diabetes mellitus with hyperglycemia: Secondary | ICD-10-CM | POA: Diagnosis not present

## 2020-08-03 DIAGNOSIS — E1110 Type 2 diabetes mellitus with ketoacidosis without coma: Secondary | ICD-10-CM | POA: Diagnosis not present

## 2020-08-03 DIAGNOSIS — N390 Urinary tract infection, site not specified: Secondary | ICD-10-CM | POA: Diagnosis not present

## 2020-08-03 DIAGNOSIS — Z87891 Personal history of nicotine dependence: Secondary | ICD-10-CM | POA: Diagnosis not present

## 2020-08-03 DIAGNOSIS — M1990 Unspecified osteoarthritis, unspecified site: Secondary | ICD-10-CM | POA: Diagnosis not present

## 2020-08-03 DIAGNOSIS — Z794 Long term (current) use of insulin: Secondary | ICD-10-CM | POA: Diagnosis not present

## 2020-08-03 DIAGNOSIS — I10 Essential (primary) hypertension: Secondary | ICD-10-CM | POA: Diagnosis not present

## 2020-08-03 DIAGNOSIS — Z9181 History of falling: Secondary | ICD-10-CM | POA: Diagnosis not present

## 2020-08-04 ENCOUNTER — Other Ambulatory Visit: Payer: Self-pay | Admitting: Family Medicine

## 2020-08-04 DIAGNOSIS — Z7984 Long term (current) use of oral hypoglycemic drugs: Secondary | ICD-10-CM | POA: Diagnosis not present

## 2020-08-04 DIAGNOSIS — M1990 Unspecified osteoarthritis, unspecified site: Secondary | ICD-10-CM | POA: Diagnosis not present

## 2020-08-04 DIAGNOSIS — I10 Essential (primary) hypertension: Secondary | ICD-10-CM | POA: Diagnosis not present

## 2020-08-04 DIAGNOSIS — Z794 Long term (current) use of insulin: Secondary | ICD-10-CM | POA: Diagnosis not present

## 2020-08-04 DIAGNOSIS — E785 Hyperlipidemia, unspecified: Secondary | ICD-10-CM | POA: Diagnosis not present

## 2020-08-04 DIAGNOSIS — E1165 Type 2 diabetes mellitus with hyperglycemia: Secondary | ICD-10-CM | POA: Diagnosis not present

## 2020-08-04 DIAGNOSIS — Z87891 Personal history of nicotine dependence: Secondary | ICD-10-CM | POA: Diagnosis not present

## 2020-08-04 DIAGNOSIS — Z9181 History of falling: Secondary | ICD-10-CM | POA: Diagnosis not present

## 2020-08-04 DIAGNOSIS — N390 Urinary tract infection, site not specified: Secondary | ICD-10-CM | POA: Diagnosis not present

## 2020-08-04 DIAGNOSIS — E1110 Type 2 diabetes mellitus with ketoacidosis without coma: Secondary | ICD-10-CM | POA: Diagnosis not present

## 2020-08-05 ENCOUNTER — Ambulatory Visit: Payer: Medicare Other | Admitting: Family Medicine

## 2020-08-06 DIAGNOSIS — I10 Essential (primary) hypertension: Secondary | ICD-10-CM | POA: Diagnosis not present

## 2020-08-06 DIAGNOSIS — M1990 Unspecified osteoarthritis, unspecified site: Secondary | ICD-10-CM | POA: Diagnosis not present

## 2020-08-06 DIAGNOSIS — Z7984 Long term (current) use of oral hypoglycemic drugs: Secondary | ICD-10-CM | POA: Diagnosis not present

## 2020-08-06 DIAGNOSIS — Z87891 Personal history of nicotine dependence: Secondary | ICD-10-CM | POA: Diagnosis not present

## 2020-08-06 DIAGNOSIS — Z794 Long term (current) use of insulin: Secondary | ICD-10-CM | POA: Diagnosis not present

## 2020-08-06 DIAGNOSIS — E785 Hyperlipidemia, unspecified: Secondary | ICD-10-CM | POA: Diagnosis not present

## 2020-08-06 DIAGNOSIS — Z9181 History of falling: Secondary | ICD-10-CM | POA: Diagnosis not present

## 2020-08-06 DIAGNOSIS — E1165 Type 2 diabetes mellitus with hyperglycemia: Secondary | ICD-10-CM | POA: Diagnosis not present

## 2020-08-06 DIAGNOSIS — N390 Urinary tract infection, site not specified: Secondary | ICD-10-CM | POA: Diagnosis not present

## 2020-08-06 DIAGNOSIS — E1110 Type 2 diabetes mellitus with ketoacidosis without coma: Secondary | ICD-10-CM | POA: Diagnosis not present

## 2020-08-07 DIAGNOSIS — I10 Essential (primary) hypertension: Secondary | ICD-10-CM | POA: Diagnosis not present

## 2020-08-07 DIAGNOSIS — E1110 Type 2 diabetes mellitus with ketoacidosis without coma: Secondary | ICD-10-CM | POA: Diagnosis not present

## 2020-08-07 DIAGNOSIS — N390 Urinary tract infection, site not specified: Secondary | ICD-10-CM | POA: Diagnosis not present

## 2020-08-07 DIAGNOSIS — Z794 Long term (current) use of insulin: Secondary | ICD-10-CM | POA: Diagnosis not present

## 2020-08-07 DIAGNOSIS — Z7984 Long term (current) use of oral hypoglycemic drugs: Secondary | ICD-10-CM | POA: Diagnosis not present

## 2020-08-07 DIAGNOSIS — M1990 Unspecified osteoarthritis, unspecified site: Secondary | ICD-10-CM | POA: Diagnosis not present

## 2020-08-07 DIAGNOSIS — Z87891 Personal history of nicotine dependence: Secondary | ICD-10-CM | POA: Diagnosis not present

## 2020-08-07 DIAGNOSIS — E785 Hyperlipidemia, unspecified: Secondary | ICD-10-CM | POA: Diagnosis not present

## 2020-08-07 DIAGNOSIS — E1165 Type 2 diabetes mellitus with hyperglycemia: Secondary | ICD-10-CM | POA: Diagnosis not present

## 2020-08-07 DIAGNOSIS — Z9181 History of falling: Secondary | ICD-10-CM | POA: Diagnosis not present

## 2020-08-10 ENCOUNTER — Telehealth: Payer: Self-pay

## 2020-08-10 ENCOUNTER — Other Ambulatory Visit: Payer: Self-pay | Admitting: Family Medicine

## 2020-08-10 ENCOUNTER — Encounter: Payer: Self-pay | Admitting: Endocrinology

## 2020-08-10 DIAGNOSIS — Z87891 Personal history of nicotine dependence: Secondary | ICD-10-CM | POA: Diagnosis not present

## 2020-08-10 DIAGNOSIS — Z794 Long term (current) use of insulin: Secondary | ICD-10-CM | POA: Diagnosis not present

## 2020-08-10 DIAGNOSIS — N390 Urinary tract infection, site not specified: Secondary | ICD-10-CM | POA: Diagnosis not present

## 2020-08-10 DIAGNOSIS — E785 Hyperlipidemia, unspecified: Secondary | ICD-10-CM | POA: Diagnosis not present

## 2020-08-10 DIAGNOSIS — M1990 Unspecified osteoarthritis, unspecified site: Secondary | ICD-10-CM | POA: Diagnosis not present

## 2020-08-10 DIAGNOSIS — E1110 Type 2 diabetes mellitus with ketoacidosis without coma: Secondary | ICD-10-CM | POA: Diagnosis not present

## 2020-08-10 DIAGNOSIS — E1165 Type 2 diabetes mellitus with hyperglycemia: Secondary | ICD-10-CM | POA: Diagnosis not present

## 2020-08-10 DIAGNOSIS — I10 Essential (primary) hypertension: Secondary | ICD-10-CM | POA: Diagnosis not present

## 2020-08-10 DIAGNOSIS — Z9181 History of falling: Secondary | ICD-10-CM | POA: Diagnosis not present

## 2020-08-10 DIAGNOSIS — Z7984 Long term (current) use of oral hypoglycemic drugs: Secondary | ICD-10-CM | POA: Diagnosis not present

## 2020-08-10 MED ORDER — INSULIN GLARGINE 100 UNIT/ML ~~LOC~~ SOLN: 15.0000 [IU] | Freq: Every day | SUBCUTANEOUS | 11 refills | Status: DC

## 2020-08-10 MED ORDER — ALCOHOL SWABS PADS: 1.0000 | MEDICATED_PAD | Freq: Every day | 2 refills | Status: DC

## 2020-08-10 MED ORDER — "INSULIN SYRINGE-NEEDLE U-100 31G X 5/16"" 0.3 ML MISC": 1.0000 | Freq: Every day | 1 refills | Status: DC

## 2020-08-10 NOTE — Telephone Encounter (Signed)
Please inform [place] nurse that vial rx has been called in per request.

## 2020-08-10 NOTE — Telephone Encounter (Signed)
Silvana Newness, [place] Nurse, calls nurse line requesting vial and supplies to be sent in for Lantus. Anissa reports the patient just can not get the hang of using the pen and is much better using the vial. Please send to [employer].

## 2020-08-12 DIAGNOSIS — E1110 Type 2 diabetes mellitus with ketoacidosis without coma: Secondary | ICD-10-CM | POA: Diagnosis not present

## 2020-08-12 DIAGNOSIS — Z7984 Long term (current) use of oral hypoglycemic drugs: Secondary | ICD-10-CM | POA: Diagnosis not present

## 2020-08-12 DIAGNOSIS — I10 Essential (primary) hypertension: Secondary | ICD-10-CM | POA: Diagnosis not present

## 2020-08-12 DIAGNOSIS — E1165 Type 2 diabetes mellitus with hyperglycemia: Secondary | ICD-10-CM | POA: Diagnosis not present

## 2020-08-12 DIAGNOSIS — M1990 Unspecified osteoarthritis, unspecified site: Secondary | ICD-10-CM | POA: Diagnosis not present

## 2020-08-12 DIAGNOSIS — E785 Hyperlipidemia, unspecified: Secondary | ICD-10-CM | POA: Diagnosis not present

## 2020-08-12 DIAGNOSIS — Z87891 Personal history of nicotine dependence: Secondary | ICD-10-CM | POA: Diagnosis not present

## 2020-08-12 DIAGNOSIS — N390 Urinary tract infection, site not specified: Secondary | ICD-10-CM | POA: Diagnosis not present

## 2020-08-12 DIAGNOSIS — Z794 Long term (current) use of insulin: Secondary | ICD-10-CM | POA: Diagnosis not present

## 2020-08-12 DIAGNOSIS — Z9181 History of falling: Secondary | ICD-10-CM | POA: Diagnosis not present

## 2020-08-17 ENCOUNTER — Other Ambulatory Visit: Payer: Self-pay

## 2020-08-17 ENCOUNTER — Ambulatory Visit (INDEPENDENT_AMBULATORY_CARE_PROVIDER_SITE_OTHER): Payer: Medicare Other | Admitting: Family Medicine

## 2020-08-17 ENCOUNTER — Encounter: Payer: Self-pay | Admitting: Family Medicine

## 2020-08-17 VITALS — BP 130/70 | HR 98 | Ht 64.0 in | Wt 130.5 lb

## 2020-08-17 DIAGNOSIS — E1159 Type 2 diabetes mellitus with other circulatory complications: Secondary | ICD-10-CM

## 2020-08-17 DIAGNOSIS — I10 Essential (primary) hypertension: Secondary | ICD-10-CM | POA: Diagnosis not present

## 2020-08-17 DIAGNOSIS — Z9181 History of falling: Secondary | ICD-10-CM | POA: Diagnosis not present

## 2020-08-17 DIAGNOSIS — E1140 Type 2 diabetes mellitus with diabetic neuropathy, unspecified: Secondary | ICD-10-CM

## 2020-08-17 DIAGNOSIS — M1990 Unspecified osteoarthritis, unspecified site: Secondary | ICD-10-CM | POA: Diagnosis not present

## 2020-08-17 DIAGNOSIS — Z7984 Long term (current) use of oral hypoglycemic drugs: Secondary | ICD-10-CM | POA: Diagnosis not present

## 2020-08-17 DIAGNOSIS — I152 Hypertension secondary to endocrine disorders: Secondary | ICD-10-CM | POA: Diagnosis not present

## 2020-08-17 DIAGNOSIS — E1110 Type 2 diabetes mellitus with ketoacidosis without coma: Secondary | ICD-10-CM | POA: Diagnosis not present

## 2020-08-17 DIAGNOSIS — E1165 Type 2 diabetes mellitus with hyperglycemia: Secondary | ICD-10-CM | POA: Diagnosis not present

## 2020-08-17 DIAGNOSIS — Z23 Encounter for immunization: Secondary | ICD-10-CM

## 2020-08-17 DIAGNOSIS — Z794 Long term (current) use of insulin: Secondary | ICD-10-CM | POA: Diagnosis not present

## 2020-08-17 DIAGNOSIS — Z87891 Personal history of nicotine dependence: Secondary | ICD-10-CM | POA: Diagnosis not present

## 2020-08-17 DIAGNOSIS — E785 Hyperlipidemia, unspecified: Secondary | ICD-10-CM | POA: Diagnosis not present

## 2020-08-17 DIAGNOSIS — N390 Urinary tract infection, site not specified: Secondary | ICD-10-CM | POA: Diagnosis not present

## 2020-08-17 NOTE — Patient Instructions (Signed)
It was a pleasure to see you today!  Thank you for choosing [facility] for your primary care.   Our plans for today were:  Continue your Lantus 15 units and your Metformin 1000mg  twice a day  Continue yoru Lipitor 40mg  daily for your cholesterol  Continue your Enalapril 20mg  daily for your blood pressure   Follow up on 09/21/20 at 10:30am with Dr. Tarry Kos  Best Wishes,   Mina Marble, DO

## 2020-08-17 NOTE — Assessment & Plan Note (Addendum)
Chronic, improving. CBG's much better controlled.  Continue lantus 15U daily and Metformin 1000mg  BID Follow up 09/21/20 at 10:30 am with PCP for A1C Diabetic foot exam performed today and WNL Referral to podiatry for diabetic foot care given thickened toe nails

## 2020-08-17 NOTE — Progress Notes (Signed)
   Subjective:   Patient ID: Hayley Fisher    DOB: 1930/11/30, 86 y.o. female   MRN: [id number]  Hayley Fisher is a 86 y.o. female with a history of chronic venous insufficiency, HTN, HLD, T2DM, DDD, osteopenia, chronic skin ulcer of skin here for follow up  Diabetes: Last three A1C's below. Currently on Lantus 15U QD, Metformin 1000mg  BID. Endorses compliance. Notes CBGs are on average in the 120's. Yesterday morning was 112, this morning was 137. Denies any hypoglycemia. Denies any polyuria, polydipsia, polyphagia. Due for diabetic foot exam.  Lab Results  Component Value Date   HGBA1C 10.2 (A) 06/19/2020   HGBA1C 8.2 (A) 02/26/2020   HGBA1C 7.0 10/31/2019    HTN:  BP: 130/70 today. Currently on Enalapril 20mg  QD. Endorses compliance. Non-smoker. Denies any chest pain, SOB, vision changes, or headaches.   Health Maintenance: There are no preventive care reminders to display for this patient. Review of Systems:  Per HPI.   Objective:   BP 130/70   Pulse 98   Ht 5\' 4"  (1.626 m)   Wt 130 lb 8 oz (59.2 kg)   SpO2 97%   BMI 22.40 kg/m  Vitals and nursing note reviewed.  General: pleasant elderly woman, sitting comfortably in exam chair, well nourished, well developed, in no acute distress with non-toxic appearance Resp: breathing comfortably on room air, speaking in full sentences Skin: warm, dry Extremities: warm and well perfused, normal tone Neuro: Alert and oriented, speech normal  Diabetic foot exam was performed with the following findings:   No deformities, ulcerations, or other skin breakdown Normal sensation of 10g monofilament Intact posterior tibialis and dorsalis pedis pulses Thickened toenails    Assessment & Plan:   Hypertension associated with diabetes ([place]) Chronic, well controlled. On statin. Nonsmoker. Continue Enalapril 20mg  QD Last BMP on 07/27/20 with normal kidney function.  Type 2 diabetes mellitus with diabetic neuropathy (HCC) Chronic,  improving. CBG's much better controlled.  Continue lantus 15U daily and Metformin 1000mg  BID Follow up 09/21/20 at 10:30 am with PCP for A1C Diabetic foot exam performed today and WNL Referral to podiatry for diabetic foot care given thickened toe nails  Orders Placed This Encounter  Procedures  . Flu Vaccine QUAD High Dose(Fluad)  . Ambulatory referral to Podiatry    Referral Priority:   Routine    Referral Type:   Consultation    Referral Reason:   Specialty Services Required    Requested Specialty:   Podiatry    Number of Visits Requested:   1   No orders of the defined types were placed in this encounter.   Mina Marble, DO PGY-3, [facility] 08/17/2020 11:43 AM

## 2020-08-17 NOTE — Assessment & Plan Note (Signed)
Chronic, well controlled. On statin. Nonsmoker. Continue Enalapril 20mg  QD Last BMP on 07/27/20 with normal kidney function.

## 2020-08-18 DIAGNOSIS — N390 Urinary tract infection, site not specified: Secondary | ICD-10-CM | POA: Diagnosis not present

## 2020-08-18 DIAGNOSIS — E785 Hyperlipidemia, unspecified: Secondary | ICD-10-CM | POA: Diagnosis not present

## 2020-08-18 DIAGNOSIS — Z87891 Personal history of nicotine dependence: Secondary | ICD-10-CM | POA: Diagnosis not present

## 2020-08-18 DIAGNOSIS — M1990 Unspecified osteoarthritis, unspecified site: Secondary | ICD-10-CM | POA: Diagnosis not present

## 2020-08-18 DIAGNOSIS — E1110 Type 2 diabetes mellitus with ketoacidosis without coma: Secondary | ICD-10-CM | POA: Diagnosis not present

## 2020-08-18 DIAGNOSIS — Z7984 Long term (current) use of oral hypoglycemic drugs: Secondary | ICD-10-CM | POA: Diagnosis not present

## 2020-08-18 DIAGNOSIS — Z794 Long term (current) use of insulin: Secondary | ICD-10-CM | POA: Diagnosis not present

## 2020-08-18 DIAGNOSIS — E1165 Type 2 diabetes mellitus with hyperglycemia: Secondary | ICD-10-CM | POA: Diagnosis not present

## 2020-08-18 DIAGNOSIS — I10 Essential (primary) hypertension: Secondary | ICD-10-CM | POA: Diagnosis not present

## 2020-08-18 DIAGNOSIS — Z9181 History of falling: Secondary | ICD-10-CM | POA: Diagnosis not present

## 2020-08-20 DIAGNOSIS — E1165 Type 2 diabetes mellitus with hyperglycemia: Secondary | ICD-10-CM | POA: Diagnosis not present

## 2020-08-20 DIAGNOSIS — I10 Essential (primary) hypertension: Secondary | ICD-10-CM | POA: Diagnosis not present

## 2020-08-20 DIAGNOSIS — Z9181 History of falling: Secondary | ICD-10-CM | POA: Diagnosis not present

## 2020-08-20 DIAGNOSIS — Z794 Long term (current) use of insulin: Secondary | ICD-10-CM | POA: Diagnosis not present

## 2020-08-20 DIAGNOSIS — E1110 Type 2 diabetes mellitus with ketoacidosis without coma: Secondary | ICD-10-CM | POA: Diagnosis not present

## 2020-08-20 DIAGNOSIS — M1990 Unspecified osteoarthritis, unspecified site: Secondary | ICD-10-CM | POA: Diagnosis not present

## 2020-08-20 DIAGNOSIS — Z87891 Personal history of nicotine dependence: Secondary | ICD-10-CM | POA: Diagnosis not present

## 2020-08-20 DIAGNOSIS — Z7984 Long term (current) use of oral hypoglycemic drugs: Secondary | ICD-10-CM | POA: Diagnosis not present

## 2020-08-20 DIAGNOSIS — E785 Hyperlipidemia, unspecified: Secondary | ICD-10-CM | POA: Diagnosis not present

## 2020-08-20 DIAGNOSIS — N390 Urinary tract infection, site not specified: Secondary | ICD-10-CM | POA: Diagnosis not present

## 2020-08-24 DIAGNOSIS — E785 Hyperlipidemia, unspecified: Secondary | ICD-10-CM | POA: Diagnosis not present

## 2020-08-24 DIAGNOSIS — E1165 Type 2 diabetes mellitus with hyperglycemia: Secondary | ICD-10-CM | POA: Diagnosis not present

## 2020-08-24 DIAGNOSIS — N390 Urinary tract infection, site not specified: Secondary | ICD-10-CM | POA: Diagnosis not present

## 2020-08-24 DIAGNOSIS — Z87891 Personal history of nicotine dependence: Secondary | ICD-10-CM | POA: Diagnosis not present

## 2020-08-24 DIAGNOSIS — Z9181 History of falling: Secondary | ICD-10-CM | POA: Diagnosis not present

## 2020-08-24 DIAGNOSIS — E1110 Type 2 diabetes mellitus with ketoacidosis without coma: Secondary | ICD-10-CM | POA: Diagnosis not present

## 2020-08-24 DIAGNOSIS — M1990 Unspecified osteoarthritis, unspecified site: Secondary | ICD-10-CM | POA: Diagnosis not present

## 2020-08-24 DIAGNOSIS — I10 Essential (primary) hypertension: Secondary | ICD-10-CM | POA: Diagnosis not present

## 2020-08-24 DIAGNOSIS — Z794 Long term (current) use of insulin: Secondary | ICD-10-CM | POA: Diagnosis not present

## 2020-08-24 DIAGNOSIS — Z7984 Long term (current) use of oral hypoglycemic drugs: Secondary | ICD-10-CM | POA: Diagnosis not present

## 2020-08-25 DIAGNOSIS — E785 Hyperlipidemia, unspecified: Secondary | ICD-10-CM | POA: Diagnosis not present

## 2020-08-25 DIAGNOSIS — E1165 Type 2 diabetes mellitus with hyperglycemia: Secondary | ICD-10-CM | POA: Diagnosis not present

## 2020-08-25 DIAGNOSIS — M1990 Unspecified osteoarthritis, unspecified site: Secondary | ICD-10-CM | POA: Diagnosis not present

## 2020-08-25 DIAGNOSIS — Z7984 Long term (current) use of oral hypoglycemic drugs: Secondary | ICD-10-CM | POA: Diagnosis not present

## 2020-08-25 DIAGNOSIS — Z9181 History of falling: Secondary | ICD-10-CM | POA: Diagnosis not present

## 2020-08-25 DIAGNOSIS — Z794 Long term (current) use of insulin: Secondary | ICD-10-CM | POA: Diagnosis not present

## 2020-08-25 DIAGNOSIS — Z87891 Personal history of nicotine dependence: Secondary | ICD-10-CM | POA: Diagnosis not present

## 2020-08-25 DIAGNOSIS — E1110 Type 2 diabetes mellitus with ketoacidosis without coma: Secondary | ICD-10-CM | POA: Diagnosis not present

## 2020-08-25 DIAGNOSIS — I10 Essential (primary) hypertension: Secondary | ICD-10-CM | POA: Diagnosis not present

## 2020-08-25 DIAGNOSIS — N390 Urinary tract infection, site not specified: Secondary | ICD-10-CM | POA: Diagnosis not present

## 2020-08-26 DIAGNOSIS — E113311 Type 2 diabetes mellitus with moderate nonproliferative diabetic retinopathy with macular edema, right eye: Secondary | ICD-10-CM | POA: Diagnosis not present

## 2020-08-26 DIAGNOSIS — E113312 Type 2 diabetes mellitus with moderate nonproliferative diabetic retinopathy with macular edema, left eye: Secondary | ICD-10-CM | POA: Diagnosis not present

## 2020-08-26 DIAGNOSIS — E113313 Type 2 diabetes mellitus with moderate nonproliferative diabetic retinopathy with macular edema, bilateral: Secondary | ICD-10-CM | POA: Diagnosis not present

## 2020-08-27 ENCOUNTER — Telehealth: Payer: Self-pay

## 2020-08-27 ENCOUNTER — Ambulatory Visit: Payer: Medicare Other | Admitting: Podiatry

## 2020-08-27 DIAGNOSIS — E1110 Type 2 diabetes mellitus with ketoacidosis without coma: Secondary | ICD-10-CM | POA: Diagnosis not present

## 2020-08-27 DIAGNOSIS — Z7984 Long term (current) use of oral hypoglycemic drugs: Secondary | ICD-10-CM | POA: Diagnosis not present

## 2020-08-27 DIAGNOSIS — N390 Urinary tract infection, site not specified: Secondary | ICD-10-CM | POA: Diagnosis not present

## 2020-08-27 DIAGNOSIS — E1165 Type 2 diabetes mellitus with hyperglycemia: Secondary | ICD-10-CM | POA: Diagnosis not present

## 2020-08-27 DIAGNOSIS — I10 Essential (primary) hypertension: Secondary | ICD-10-CM | POA: Diagnosis not present

## 2020-08-27 DIAGNOSIS — R300 Dysuria: Secondary | ICD-10-CM

## 2020-08-27 DIAGNOSIS — Z794 Long term (current) use of insulin: Secondary | ICD-10-CM | POA: Diagnosis not present

## 2020-08-27 DIAGNOSIS — E785 Hyperlipidemia, unspecified: Secondary | ICD-10-CM | POA: Diagnosis not present

## 2020-08-27 DIAGNOSIS — M1990 Unspecified osteoarthritis, unspecified site: Secondary | ICD-10-CM | POA: Diagnosis not present

## 2020-08-27 DIAGNOSIS — Z9181 History of falling: Secondary | ICD-10-CM | POA: Diagnosis not present

## 2020-08-27 DIAGNOSIS — Z87891 Personal history of nicotine dependence: Secondary | ICD-10-CM | POA: Diagnosis not present

## 2020-08-27 NOTE — Telephone Encounter (Signed)
Agree and order cosigned.

## 2020-08-27 NOTE — Telephone Encounter (Signed)
Saunders Glance, HH Nurse, calls nurse line reporting possible UTI in patient. Anissa reports the patient is experiencing frequency and burning, no other symptoms at this time. Anissa reports symptoms have been present since Monday. Anissa requesting a VO to collect a urine on patient. Will place orders in epic for [facility].

## 2020-08-28 ENCOUNTER — Telehealth: Payer: Self-pay | Admitting: Family Medicine

## 2020-08-28 NOTE — Telephone Encounter (Signed)
Patients daughter Jethro Bastos is calling and would like to know if Dr. Tarry Kos can place a new referral for her home health. She is very unhappy with [facility]. She would like a referral to be placed with [facility] or [facility].   Please call daughter with any questions at [phone]

## 2020-08-30 ENCOUNTER — Other Ambulatory Visit: Payer: Self-pay | Admitting: Family Medicine

## 2020-08-30 DIAGNOSIS — Z794 Long term (current) use of insulin: Secondary | ICD-10-CM

## 2020-08-30 DIAGNOSIS — R2681 Unsteadiness on feet: Secondary | ICD-10-CM

## 2020-08-30 DIAGNOSIS — E1140 Type 2 diabetes mellitus with diabetic neuropathy, unspecified: Secondary | ICD-10-CM

## 2020-08-30 NOTE — Progress Notes (Signed)
alth

## 2020-08-30 NOTE — Telephone Encounter (Signed)
Please inform daughter that I have placed new referral. I am not sure if this referral will be able to replace current [place] aid given selection is based on insurance and availability. However I have done my best to help them if possible.

## 2020-08-31 DIAGNOSIS — E1110 Type 2 diabetes mellitus with ketoacidosis without coma: Secondary | ICD-10-CM | POA: Diagnosis not present

## 2020-08-31 DIAGNOSIS — E1165 Type 2 diabetes mellitus with hyperglycemia: Secondary | ICD-10-CM | POA: Diagnosis not present

## 2020-08-31 DIAGNOSIS — Z794 Long term (current) use of insulin: Secondary | ICD-10-CM | POA: Diagnosis not present

## 2020-08-31 DIAGNOSIS — M1990 Unspecified osteoarthritis, unspecified site: Secondary | ICD-10-CM | POA: Diagnosis not present

## 2020-08-31 DIAGNOSIS — Z87891 Personal history of nicotine dependence: Secondary | ICD-10-CM | POA: Diagnosis not present

## 2020-08-31 DIAGNOSIS — Z7984 Long term (current) use of oral hypoglycemic drugs: Secondary | ICD-10-CM | POA: Diagnosis not present

## 2020-08-31 DIAGNOSIS — E785 Hyperlipidemia, unspecified: Secondary | ICD-10-CM | POA: Diagnosis not present

## 2020-08-31 DIAGNOSIS — Z9181 History of falling: Secondary | ICD-10-CM | POA: Diagnosis not present

## 2020-08-31 DIAGNOSIS — N390 Urinary tract infection, site not specified: Secondary | ICD-10-CM | POA: Diagnosis not present

## 2020-08-31 DIAGNOSIS — I10 Essential (primary) hypertension: Secondary | ICD-10-CM | POA: Diagnosis not present

## 2020-09-01 ENCOUNTER — Other Ambulatory Visit: Payer: Self-pay | Admitting: Family Medicine

## 2020-09-01 ENCOUNTER — Telehealth: Payer: Self-pay | Admitting: Family Medicine

## 2020-09-01 DIAGNOSIS — R300 Dysuria: Secondary | ICD-10-CM

## 2020-09-01 MED ORDER — NITROFURANTOIN MONOHYD MACRO 100 MG PO CAPS: 100.0000 mg | ORAL_CAPSULE | Freq: Two times a day (BID) | ORAL | 0 refills | Status: AC

## 2020-09-01 NOTE — Telephone Encounter (Signed)
Patient daughter calling requesting the result from her mothers urine test. She says her mother is in pain and no one has called her regarding this. Please advise.

## 2020-09-01 NOTE — Telephone Encounter (Signed)
Follow up. No evidence of pyelo at this time per daughter. Normal kidney function.

## 2020-09-01 NOTE — Telephone Encounter (Signed)
Spoke to daughter. Informed her that no urine results are in the system. Recommended she bring in urine sample today to have evaluated. Will due POC UA and urine culture. She notes that she has frequency, urgency, and dysuria. Will empirically treat with Nitrofurantoin 100mg  BID x 5d. Prior urine cultures with resistance to Bactrim and Cirpo. Sensative to cephalosporin and Macrobid. Recently treated with Keflex in December.

## 2020-09-02 ENCOUNTER — Telehealth: Payer: Self-pay | Admitting: Family Medicine

## 2020-09-02 DIAGNOSIS — H9193 Unspecified hearing loss, bilateral: Secondary | ICD-10-CM

## 2020-09-02 NOTE — Telephone Encounter (Signed)
Spoke to daughter about urine results which indicate E.coli positive urine sensitive to nitrofurantoin. Patient already feeling somewhat better. Plan to repeat urine at follow up visit on 1/31 to ensure improvement.  Daughter also notes that patient's morning blood sugars have been low, sometimes in the 50's. Recommended she decrease lantus to 10 units. Keep blood sugar log. Follow up on 09/21/20.   Daughter notes "hard of hearing - she cant hear thunder". Has never been evaluated or had hearing aids before. Would like audiology referral. Referral placed.

## 2020-09-02 NOTE — Telephone Encounter (Signed)
Spoke to daughter. See phone note for details.

## 2020-09-02 NOTE — Telephone Encounter (Signed)
Spoke to patient's daughter Geni Bers, she states that she is currently going to " try out a new girl form the same agency for 30 days".  Please keep the referral open for a transfer to another agency if things don't work out with this one.  She also states that her mother needs a referral to see and Audiologist.  Geni Bers states that she is very appreciative of all the help she has received from [facility] especially Dr. Tarry Kos.  Ozella Almond, [place]

## 2020-09-04 DIAGNOSIS — M1990 Unspecified osteoarthritis, unspecified site: Secondary | ICD-10-CM | POA: Diagnosis not present

## 2020-09-04 DIAGNOSIS — E1165 Type 2 diabetes mellitus with hyperglycemia: Secondary | ICD-10-CM | POA: Diagnosis not present

## 2020-09-04 DIAGNOSIS — Z87891 Personal history of nicotine dependence: Secondary | ICD-10-CM | POA: Diagnosis not present

## 2020-09-04 DIAGNOSIS — Z794 Long term (current) use of insulin: Secondary | ICD-10-CM | POA: Diagnosis not present

## 2020-09-04 DIAGNOSIS — N390 Urinary tract infection, site not specified: Secondary | ICD-10-CM | POA: Diagnosis not present

## 2020-09-04 DIAGNOSIS — Z7984 Long term (current) use of oral hypoglycemic drugs: Secondary | ICD-10-CM | POA: Diagnosis not present

## 2020-09-04 DIAGNOSIS — Z9181 History of falling: Secondary | ICD-10-CM | POA: Diagnosis not present

## 2020-09-04 DIAGNOSIS — E1110 Type 2 diabetes mellitus with ketoacidosis without coma: Secondary | ICD-10-CM | POA: Diagnosis not present

## 2020-09-04 DIAGNOSIS — I10 Essential (primary) hypertension: Secondary | ICD-10-CM | POA: Diagnosis not present

## 2020-09-04 DIAGNOSIS — E785 Hyperlipidemia, unspecified: Secondary | ICD-10-CM | POA: Diagnosis not present

## 2020-09-05 ENCOUNTER — Other Ambulatory Visit: Payer: Self-pay | Admitting: Student in an Organized Health Care Education/Training Program

## 2020-09-05 NOTE — Progress Notes (Signed)
**  After Hours/ Emergency Line Call*  S:  Patient's daughter calls from [place] with concerns about her mother. Per her report, EMS just left the house. Pt called them because she just didn't feel good, just has a weird sensation.  Completed UTI tx and continues to have urinary frequency and dysuria with cloudy appearing urine.  Not nauseous, just gets dizzy. Denies fevers.  BP was 116/74 BS 3 hundred-something. Recent decrease in lantus dose.  A/P: symptoms possibly due to medication side effects, urine infection, hyperglycemia, COVID. EMS assessed and did not transport to hospital due to stable vitals and glucose.  Offered ED evaluation vs appointment for f/u and daughter elected to make apt in clinic. - scheduled with Dr. Jeannine Kitten 1/17 afternoon.  - ED precautions given

## 2020-09-07 ENCOUNTER — Ambulatory Visit: Payer: Medicare Other | Admitting: Family Medicine

## 2020-09-07 ENCOUNTER — Telehealth (INDEPENDENT_AMBULATORY_CARE_PROVIDER_SITE_OTHER): Payer: Medicare Other | Admitting: Family Medicine

## 2020-09-07 DIAGNOSIS — E1140 Type 2 diabetes mellitus with diabetic neuropathy, unspecified: Secondary | ICD-10-CM | POA: Diagnosis not present

## 2020-09-07 DIAGNOSIS — Z794 Long term (current) use of insulin: Secondary | ICD-10-CM

## 2020-09-07 DIAGNOSIS — N390 Urinary tract infection, site not specified: Secondary | ICD-10-CM

## 2020-09-07 DIAGNOSIS — L299 Pruritus, unspecified: Secondary | ICD-10-CM

## 2020-09-07 MED ORDER — FLUCONAZOLE 150 MG PO TABS: ORAL_TABLET | ORAL | 0 refills | Status: DC

## 2020-09-07 NOTE — Assessment & Plan Note (Signed)
Chronic. Most recent UTI treated with Macrobid (culture sensitive).  Still having some persistent symptoms. Daughter reports 6-7 UTI's in the last year. Diabetes appears to be well controlled. Concern for other cause of patient's frequent UTI symptoms. Per daughter request, urology referral placed.

## 2020-09-07 NOTE — Assessment & Plan Note (Signed)
Chronic, well controlled. No further hypoglycemic episodes with well controlled fasting blood sugars. Continue Lantus 10U and Metformin 1000mg  BID Follow up with Endocrinology as scheduled

## 2020-09-07 NOTE — Assessment & Plan Note (Signed)
Endorsing genital irritation and itching with dysuria. S/p completion of Macrobid x 5 days (culture sensitive). Suspect possible yeast infection causing the dysuria. Will empirically treat with Diflucan x2. If no improvement, patient will need to follow up in person for UA, urine culture, and pelvic exam. Daughter, patient, and home health aid understand and agrees with plan.

## 2020-09-07 NOTE — Progress Notes (Signed)
[place] Telemedicine Visit  Patient consented to have virtual visit and was identified by name and date of birth. Method of visit: Video  Encounter participants: Patient: Hayley Fisher - located at home Provider: Danna Hefty - located at home Others (if applicable): [name], Home health aid - Verdene Lennert (CNA)   Chief Complaint: diabetes and urinary symptoms.   HPI: Urinary symptoms: Patient was treated for UTI that was sensitive to [place]. She completed her antibiotics. Still urinating frequently, very itchy, dysuria. Having to wake up 20 times a night.  Per daughter, she has had about 6-7 UTI's over the last year. Denies any fevers, flank pain.   Diabetes: Last three A1C's below. Currently on Lantus 10U and Metformin 1000mg  BID. Endorses compliance. Notes Fasting CBGs range 109, 59 (change from 15U to 10U), 90, 260, 144, 129. Denies any further hypoglycemia after change.  Lab Results  Component Value Date   HGBA1C 10.2 (A) 06/19/2020   HGBA1C 8.2 (A) 02/26/2020   HGBA1C 7.0 10/31/2019    ROS: per HPI  Pertinent PMHx: frequent UTIs, Type 2 DM  Exam:  There were no vitals taken for this visit.  Respiratory: breathing comfortably on room air, speaking in full sentences  Assessment/Plan:  Type 2 diabetes mellitus with diabetic neuropathy (HCC) Chronic, well controlled. No further hypoglycemic episodes with well controlled fasting blood sugars. Continue Lantus 10U and Metformin 1000mg  BID Follow up with Endocrinology as scheduled  [place] Endorsing genital irritation and itching with dysuria. S/p completion of Macrobid x 5 days (culture sensitive). Suspect possible yeast infection causing the dysuria. Will empirically treat with Diflucan x2. If no improvement, patient will need to follow up in person for UA, urine culture, and pelvic exam. Daughter, patient, and home health aid understand and agrees with plan.   Frequent  UTI Chronic. Most recent UTI treated with Macrobid (culture sensitive).  Still having some persistent symptoms. Daughter reports 6-7 UTI's in the last year. Diabetes appears to be well controlled. Concern for other cause of patient's frequent UTI symptoms. Per daughter request, urology referral placed.    Time spent during visit with patient: 15 minutes  Mina Marble, [place], PGY3 09/07/2020 12:12 PM

## 2020-09-09 ENCOUNTER — Telehealth (INDEPENDENT_AMBULATORY_CARE_PROVIDER_SITE_OTHER): Payer: Medicare Other | Admitting: Family Medicine

## 2020-09-09 ENCOUNTER — Other Ambulatory Visit (INDEPENDENT_AMBULATORY_CARE_PROVIDER_SITE_OTHER): Payer: Medicare Other

## 2020-09-09 ENCOUNTER — Other Ambulatory Visit: Payer: Self-pay

## 2020-09-09 DIAGNOSIS — R300 Dysuria: Secondary | ICD-10-CM

## 2020-09-09 DIAGNOSIS — R3589 Other polyuria: Secondary | ICD-10-CM

## 2020-09-09 LAB — POCT URINALYSIS DIP (MANUAL ENTRY)
Bilirubin, UA: NEGATIVE
Glucose, UA: 100 mg/dL — AB
Ketones, POC UA: NEGATIVE mg/dL
Nitrite, UA: NEGATIVE
Protein Ur, POC: 30 mg/dL — AB
Spec Grav, UA: 1.01 (ref 1.010–1.025)
Urobilinogen, UA: 0.2 E.U./dL
pH, UA: 5.5 (ref 5.0–8.0)

## 2020-09-09 NOTE — Progress Notes (Signed)
[place] Telemedicine Visit  Patient consented to have virtual visit and was identified by name and date of birth. Method of visit: Video  Encounter participants: Patient: Meeyah Ovitt - located at home Provider: Danna Hefty - located at home Others (if applicable): [name]  Chief Complaint: increased urine frequency  HPI: Spoke to [place] regarding continued increased urinary frequency. She has completed course of diflucan as prescribed. She also completed course of Macrobid for UTI (culture sensitive). Urology referral has been placed. She notes that she just constantly has to pee. Denies any difficulty urinating - can be small or large amount. She does not have difficulty with incontinence. She denies difficulty getting to the bathroom in time. Denies fevers or chills.   ROS: per HPI  Pertinent PMHx: h/o frequent UTI's, h/o bladder suspension,   Exam:  There were no vitals taken for this visit.  Respiratory: breathing comfortably on room air, speaking in full sentences  Assessment/Plan:  Polyuria Acute. Unclear etiology. Report of freqeuent urination of at least 40 times per night. This is affecting patient's sleep. No associated dysuria or incontinenence. No concern for obstruction. No signs of systemic infection. Diabetes appears to be better controlled based on last assessment 2 days ago. Currently being treated for yeast infection. Does have a history of bladder suspension surgery years ago. Differential at this time includes another UTI, DI, glucosuria/osmotic diuresis, overactive bladder. - Lab visit scheduled for CMP, UA with microcsopy and urine culture - if unremarkable for cause, plan to obtain 24-hour urine for further urine studies - urology referral already in place    Time spent during visit with patient: 25 minutes  Mina Marble, [place], PGY3 09/09/2020 11:30 AM

## 2020-09-09 NOTE — Assessment & Plan Note (Addendum)
Acute. Unclear etiology. Report of freqeuent urination of at least 40 times per night. This is affecting patient's sleep. No associated dysuria or incontinenence. No concern for obstruction. No signs of systemic infection. Diabetes appears to be better controlled based on last assessment 2 days ago. Currently being treated for yeast infection. Does have a history of bladder suspension surgery years ago. Differential at this time includes another UTI, DI, glucosuria/osmotic diuresis, overactive bladder. - Lab visit scheduled for CMP, UA with microcsopy and urine culture - if unremarkable for cause, plan to obtain 24-hour urine for further urine studies - urology referral already in place

## 2020-09-10 ENCOUNTER — Other Ambulatory Visit: Payer: Self-pay | Admitting: Family Medicine

## 2020-09-10 DIAGNOSIS — Z87891 Personal history of nicotine dependence: Secondary | ICD-10-CM | POA: Diagnosis not present

## 2020-09-10 DIAGNOSIS — R3589 Other polyuria: Secondary | ICD-10-CM | POA: Diagnosis not present

## 2020-09-10 DIAGNOSIS — E785 Hyperlipidemia, unspecified: Secondary | ICD-10-CM | POA: Diagnosis not present

## 2020-09-10 DIAGNOSIS — Z7984 Long term (current) use of oral hypoglycemic drugs: Secondary | ICD-10-CM | POA: Diagnosis not present

## 2020-09-10 DIAGNOSIS — M1990 Unspecified osteoarthritis, unspecified site: Secondary | ICD-10-CM | POA: Diagnosis not present

## 2020-09-10 DIAGNOSIS — Z794 Long term (current) use of insulin: Secondary | ICD-10-CM | POA: Diagnosis not present

## 2020-09-10 DIAGNOSIS — E1165 Type 2 diabetes mellitus with hyperglycemia: Secondary | ICD-10-CM | POA: Diagnosis not present

## 2020-09-10 DIAGNOSIS — Z9181 History of falling: Secondary | ICD-10-CM | POA: Diagnosis not present

## 2020-09-10 DIAGNOSIS — N390 Urinary tract infection, site not specified: Secondary | ICD-10-CM | POA: Diagnosis not present

## 2020-09-10 DIAGNOSIS — I10 Essential (primary) hypertension: Secondary | ICD-10-CM | POA: Diagnosis not present

## 2020-09-10 DIAGNOSIS — E1110 Type 2 diabetes mellitus with ketoacidosis without coma: Secondary | ICD-10-CM | POA: Diagnosis not present

## 2020-09-11 ENCOUNTER — Telehealth: Payer: Self-pay | Admitting: Family Medicine

## 2020-09-11 ENCOUNTER — Encounter: Payer: Self-pay | Admitting: Family Medicine

## 2020-09-11 DIAGNOSIS — N3289 Other specified disorders of bladder: Secondary | ICD-10-CM | POA: Insufficient documentation

## 2020-09-11 DIAGNOSIS — R350 Frequency of micturition: Secondary | ICD-10-CM | POA: Insufficient documentation

## 2020-09-11 LAB — URINE CULTURE

## 2020-09-11 NOTE — Telephone Encounter (Signed)
Patients daughter is calling and would like for Dr. Tarry Kos to call her to discuss the continued issues she is having with her uti.   The best call back is [phone]

## 2020-09-11 NOTE — Telephone Encounter (Signed)
**   After Hours Emergency Line Call **  The patient's daughter [name] called the after-hours emergency line reporting that the patient, Hayley Fisher, continues to have urinary frequency at home. The patient reports that she cannot sleep, that she is urinating every 15-20 minutes. She does experience some burning with urination but reports that the Diflucan she took for the yeast helped some. She denies having any issues with incontinence, she can always tell when she needs to go to the bathroom, however sometimes she has difficulty making it to the bathroom in time and may have a small accident.  The patient's PCP Dr. Tarry Kos has been working this patient up for various causes of her urinary frequency. I reassured the patient's daughter that the most recent urine labs are negative for any sort of infection, however bladder irritation and overactive bladder are other potential causes of the patient's symptoms. Low suspicion at this point in time for her diabetes insipidus, however this is not yet officially been ruled out.  After speaking with the patient's daughter, I sent to the patient's daughter a message via [facility] listing various bladder irritants that the patient can avoid in hopes of reducing the urinary frequency and urgency. Also recommended she try Azo and Zyrtec to help reduce any bladder irritation. Ideally the patient would take hydroxyzine, however I am hesitant to prescribe this in an 87 year old patient.  With help from the patient's daughter, I scheduled the patient with her PCP for Wednesday, January 26 at 3:30 PM.   Milus Banister, [place], PGY-3 09/11/2020 9:19 PM

## 2020-09-14 ENCOUNTER — Ambulatory Visit: Payer: Medicare Other | Admitting: Podiatry

## 2020-09-15 NOTE — Progress Notes (Signed)
   Subjective:   Patient ID: Hayley Fisher    DOB: 04-12-1931, 87 y.o. female   MRN: [id number]  Caragh Gasper is a 87 y.o. female with a history of chronic venous insufficiency, HTN, HLD, T2DM, DDD, osteopenia, chronic skin ulcer of sacrum, frequent UTI, bladder irritation, polyuria  here for follow up of polyuria  Polyuria  Pelvic pressure; Patient presents today for follow up of continued polyuria. She endorses going at least 40 times a night. She hasnt slept due to this. Recently treated for UTI with macrobid. Repeat UA with large leuks, 100 glucose, small RBC. Urine culture showed mixed flora, reincubated for further analysis. She has been referred to urology - still awaiting appointment with [facility].  She has been avoiding bladder irritants and has tried Azo, Zyrtec 10mg  QD over the last two days. She is not sure if she has seen any improvement.  Denies use of mannitol, SGLT-2, lasix, or any other OTC medications not listed. She notes her blood sugars have been well controlled and are usually in the 120's. She denies any dysuria, hematuria..  Denies excessive water intake.  Review of Systems:  Per HPI.   Objective:   BP 130/75   Pulse (!) 103   Wt 130 lb 12.8 oz (59.3 kg)   SpO2 95%   BMI 22.45 kg/m  Vitals and nursing note reviewed.  General: pleasant elderly woman, sitting comfortably in exam chair, well nourished, well developed, in no acute distress with non-toxic appearance Resp: breathing comfortably on room air, speaking in full sentences Neuro: Alert and oriented, speech normal  Assessment & Plan:   Urinary frequency Acute. Does not appear to be related to hyperglycemia, primary polydipsia, or infection. CMP obtained to rule out diabetes insipidus. Still awaiting urology appointment.  - trial Myrbetriq 25mg  daily - continue Zyrtec and Azo - follow up if no improvement, repeat UA and consider urine studies at that time - follow up CMP    Gastroesophageal  reflux disease without esophagitis Chronic. Has used omeprazole in the past. Open to trial of Pepcid given less long-term side effects. If inadequate control of symptoms will restart PPI - Pepcid 20mg  QD to BID  - OTC Tums, Gaviscon, Maalox, Mylanta PRN for breakthrough pain - Daughter to call office if inadequate control of symptoms with H2-blocker and will plan to switch back to PPI.   Orders Placed This Encounter  Procedures  . Comprehensive metabolic panel   Meds ordered this encounter  Medications  . mirabegron ER (MYRBETRIQ) 25 MG TB24 tablet    Sig: Take 1 tablet (25 mg total) by mouth daily.    Dispense:  30 tablet    Refill:  1  . famotidine (PEPCID) 20 MG tablet    Sig: Take 1 tablet (20 mg total) by mouth daily. If needed can take twice a day.    Dispense:  60 tablet    Refill:  [place], DO PGY-3, [facility] 09/16/2020 8:39 PM

## 2020-09-16 ENCOUNTER — Encounter: Payer: Self-pay | Admitting: Family Medicine

## 2020-09-16 ENCOUNTER — Ambulatory Visit (INDEPENDENT_AMBULATORY_CARE_PROVIDER_SITE_OTHER): Payer: Medicare Other | Admitting: Family Medicine

## 2020-09-16 ENCOUNTER — Other Ambulatory Visit: Payer: Self-pay

## 2020-09-16 VITALS — BP 130/75 | HR 103 | Wt 130.8 lb

## 2020-09-16 DIAGNOSIS — I10 Essential (primary) hypertension: Secondary | ICD-10-CM | POA: Diagnosis not present

## 2020-09-16 DIAGNOSIS — R350 Frequency of micturition: Secondary | ICD-10-CM

## 2020-09-16 DIAGNOSIS — M1990 Unspecified osteoarthritis, unspecified site: Secondary | ICD-10-CM | POA: Diagnosis not present

## 2020-09-16 DIAGNOSIS — K219 Gastro-esophageal reflux disease without esophagitis: Secondary | ICD-10-CM

## 2020-09-16 DIAGNOSIS — E1165 Type 2 diabetes mellitus with hyperglycemia: Secondary | ICD-10-CM | POA: Diagnosis not present

## 2020-09-16 DIAGNOSIS — Z794 Long term (current) use of insulin: Secondary | ICD-10-CM | POA: Diagnosis not present

## 2020-09-16 DIAGNOSIS — N390 Urinary tract infection, site not specified: Secondary | ICD-10-CM | POA: Diagnosis not present

## 2020-09-16 DIAGNOSIS — E1110 Type 2 diabetes mellitus with ketoacidosis without coma: Secondary | ICD-10-CM | POA: Diagnosis not present

## 2020-09-16 DIAGNOSIS — E785 Hyperlipidemia, unspecified: Secondary | ICD-10-CM | POA: Diagnosis not present

## 2020-09-16 DIAGNOSIS — Z9181 History of falling: Secondary | ICD-10-CM | POA: Diagnosis not present

## 2020-09-16 DIAGNOSIS — Z87891 Personal history of nicotine dependence: Secondary | ICD-10-CM | POA: Diagnosis not present

## 2020-09-16 DIAGNOSIS — Z7984 Long term (current) use of oral hypoglycemic drugs: Secondary | ICD-10-CM | POA: Diagnosis not present

## 2020-09-16 HISTORY — DX: Gastro-esophageal reflux disease without esophagitis: K21.9

## 2020-09-16 MED ORDER — FAMOTIDINE 20 MG PO TABS: 20.0000 mg | ORAL_TABLET | Freq: Every day | ORAL | 11 refills | Status: DC

## 2020-09-16 MED ORDER — MIRABEGRON ER 25 MG PO TB24: 25.0000 mg | ORAL_TABLET | Freq: Every day | ORAL | 1 refills | Status: DC

## 2020-09-16 NOTE — Assessment & Plan Note (Signed)
Acute. Does not appear to be related to hyperglycemia, primary polydipsia, or infection. CMP obtained to rule out diabetes insipidus. Still awaiting urology appointment.  - trial Myrbetriq 25mg  daily - continue Zyrtec and Azo - follow up if no improvement, repeat UA and consider urine studies at that time - follow up CMP

## 2020-09-16 NOTE — Assessment & Plan Note (Signed)
Chronic. Has used omeprazole in the past. Open to trial of Pepcid given less long-term side effects. If inadequate control of symptoms will restart PPI - Pepcid 20mg  QD to BID  - OTC Tums, Gaviscon, Maalox, Mylanta PRN for breakthrough pain - Daughter to call office if inadequate control of symptoms with H2-blocker and will plan to switch back to PPI.

## 2020-09-16 NOTE — Patient Instructions (Signed)
It was a pleasure to see you today!  Thank you for choosing [facility] for your primary care.  Our plans for today were:  I am checking blood word to find out why you  May be peeing so much  You can start the medicine called Mirabegron 25mg . Take this medicine daily.   Please expect a call from the urologist office to schedule an appointment for further evaluation.   Best Wishes,   Mina Marble, DO

## 2020-09-17 LAB — COMPREHENSIVE METABOLIC PANEL
ALT: 23 IU/L (ref 0–32)
AST: 23 IU/L (ref 0–40)
Albumin/Globulin Ratio: 1.9 (ref 1.2–2.2)
Albumin: 4.2 g/dL (ref 3.6–4.6)
Alkaline Phosphatase: 74 IU/L (ref 44–121)
BUN/Creatinine Ratio: 23 (ref 12–28)
BUN: 16 mg/dL (ref 8–27)
Bilirubin Total: 0.4 mg/dL (ref 0.0–1.2)
CO2: 25 mmol/L (ref 20–29)
Calcium: 9.4 mg/dL (ref 8.7–10.3)
Chloride: 98 mmol/L (ref 96–106)
Creatinine, Ser: 0.69 mg/dL (ref 0.57–1.00)
GFR calc Af Amer: 91 mL/min/{1.73_m2} (ref >59)
GFR calc non Af Amer: 79 mL/min/{1.73_m2} (ref >59)
Globulin, Total: 2.2 g/dL (ref 1.5–4.5)
Glucose: 188 mg/dL — ABNORMAL HIGH (ref 65–99)
Potassium: 5 mmol/L (ref 3.5–5.2)
Sodium: 139 mmol/L (ref 134–144)
Total Protein: 6.4 g/dL (ref 6.0–8.5)

## 2020-09-18 ENCOUNTER — Other Ambulatory Visit: Payer: Self-pay | Admitting: Family Medicine

## 2020-09-18 DIAGNOSIS — R350 Frequency of micturition: Secondary | ICD-10-CM

## 2020-09-18 DIAGNOSIS — N390 Urinary tract infection, site not specified: Secondary | ICD-10-CM

## 2020-09-18 DIAGNOSIS — L299 Pruritus, unspecified: Secondary | ICD-10-CM

## 2020-09-18 LAB — URINE CULTURE

## 2020-09-18 MED ORDER — SULFAMETHOXAZOLE-TRIMETHOPRIM 800-160 MG PO TABS
1.0000 | ORAL_TABLET | Freq: Two times a day (BID) | ORAL | 0 refills | Status: AC
Start: 2020-09-18 — End: 2020-09-21

## 2020-09-18 MED ORDER — FLUCONAZOLE 150 MG PO TABS
ORAL_TABLET | ORAL | 0 refills | Status: DC
Start: 2020-09-18 — End: 2020-12-08

## 2020-09-20 NOTE — Progress Notes (Unsigned)
Subjective:   Patient ID: Hayley Fisher    DOB: 01/12/1931, 87 y.o. female   MRN: [id number]  Hayley Fisher is a 87 y.o. female with a history of frequent UTIs with persistent polyuria, HTN, GERD, HLD, T2DM, DDD, osteopenia, chronic ulcer of sacrum here for Dm follow up  Diabetes: Last three A1C's below. Currently on Metformin 1000mg  BID and Lantus 10U QD. Endorses compliance. Notes CBGs range 143 this AM, range 97-175. Denies any hypoglycemia. Denies any polyuria, polydipsia, polyphagia.  Lab Results  Component Value Date   HGBA1C 7.3 (A) 09/21/2020   HGBA1C 10.2 (A) 06/19/2020   HGBA1C 8.2 (A) 02/26/2020    HTN:  BP: 118/62 today. Currently on Enalapril 20mg  QD. Endorses compliance. Non-smoker. Denies any chest pain, SOB, vision changes, or headaches.  Last CM Pon 09/16/20 with stable kidney function and electrolytes    HLD: Last lipid panel below. Currently on Lipitor 40mg  QD. Endorses compliance. Denies any muscles aches or weakness.   Lab Results  Component Value Date   CHOL 137 06/19/2020   HDL 67 06/19/2020   LDLCALC 57 06/19/2020   LDLDIRECT 119 (H) 09/03/2013   TRIG 61 06/19/2020   CHOLHDL 2.0 06/19/2020   Frequent UTI's: Patient diagnosed with 3rd UTI since December. Most recent urine culture notable for Klebsiella and E.Coli. Results discussed with daughter/caregiver Hayley Fisher last week. Patient and home health aid present for exam today. Note that they were unaware to start Bactrim BID x 3 days based on susceptibilities. She was told to hold Myrbetriq. She has been referred to urology with appointment scheduled for 2/17. Denies fever/chills.  GERD:  Previously on PPI. Recently transitioned to Pepcid 20mg  QD to BID. Patient notes improved symptom control.  Review of Systems:  Per HPI.   Objective:   BP 118/62   Pulse 88   Wt 130 lb (59 kg)   BMI 22.31 kg/m  Vitals and nursing note reviewed.  General: pleasant elderly female, sitting comfortably in exam  chair, well nourished, well developed, in no acute distress with non-toxic appearance Resp: breathing comfortably on room air, speaking in full sentences Skin: warm, dry, fingers do not feel cold to touch  Extremities: warm and well perfused MSK: gait slow, uses walker at baseline Neuro: Alert and oriented, speech normal  Assessment & Plan:   Hypertension associated with diabetes ([place]) Chronic. Nonsmoker. On statin. Recent CMP with normal and stable kidney function. BP goal <150/90 - continue Enalapril 20mg  QD  Gastroesophageal reflux disease without esophagitis Chronic. Transitioned to H2-blocker given long term risks.  - continue Pepcid - If inadequate control of symptoms, will restart Omeprazole  Osteopenia Due for DEXA scan however given age, unclear if benefit of starting bisphosphate Strongly encourage continue Vitamin D and calcium supplementation Continue to discuss at follow up visit   Frequent UTI Acute. Recurrent. Diabetes controlled, thus unlikely cause - Follow up with urology on 2/17 - Start Bacrim DS BID x 3 days - Ensure good hydration to help flush bladder - Continue Azo PRN for symptoms - Hold Zyrtec and Myrbetriq until UTI is treated. Restart if frequent urination continues after treatment of UTI - follow up if no improvement or worsening symptoms.  Hyperlipidemia associated with type 2 diabetes mellitus (HCC) Chronic. LDL<70 - at goal. - continue Atorvastatin 40mg  QD  Normocytic anemia Chronic. Baseline Hgb 11. Last Hgb 11 in Dec. 2021. Repeat CBC obtained today per patient request. Langley Gauss acute bleed. Ferritin today to monitor for cause. Given low WBC, RBC, and  Hgb - possibly decline in bone marrow cellularity with age. Will follow up results and further evaluate if indicated.  Orders Placed This Encounter  Procedures  . CBC  . Ferritin  . HgB A1c   Meds ordered this encounter  Medications  . metFORMIN (GLUCOPHAGE-XR) 500 MG 24 hr tablet    Sig:  Take 2 tablets (1,000 mg total) by mouth 2 (two) times daily.    Dispense:  360 tablet    Refill:  3    This prescription was filled on 03/26/2020. Any refills authorized will be placed on file.  . enalapril (VASOTEC) 20 MG tablet    Sig: Take 1 tablet (20 mg total) by mouth daily.    Dispense:  90 tablet    Refill:  3    This prescription was filled on 06/17/2020. Any refills authorized will be placed on file.  Marland Kitchen atorvastatin (LIPITOR) 40 MG tablet    Sig: Take 1 tablet (40 mg total) by mouth daily.    Dispense:  90 tablet    Refill:  [place], DO PGY-3, [facility] 09/21/2020 11:31 AM

## 2020-09-21 ENCOUNTER — Other Ambulatory Visit: Payer: Self-pay

## 2020-09-21 ENCOUNTER — Encounter: Payer: Self-pay | Admitting: Family Medicine

## 2020-09-21 ENCOUNTER — Ambulatory Visit (INDEPENDENT_AMBULATORY_CARE_PROVIDER_SITE_OTHER): Payer: Medicare Other | Admitting: Family Medicine

## 2020-09-21 VITALS — BP 118/62 | HR 88 | Wt 130.0 lb

## 2020-09-21 DIAGNOSIS — K219 Gastro-esophageal reflux disease without esophagitis: Secondary | ICD-10-CM | POA: Diagnosis not present

## 2020-09-21 DIAGNOSIS — E785 Hyperlipidemia, unspecified: Secondary | ICD-10-CM

## 2020-09-21 DIAGNOSIS — M8580 Other specified disorders of bone density and structure, unspecified site: Secondary | ICD-10-CM | POA: Diagnosis not present

## 2020-09-21 DIAGNOSIS — D649 Anemia, unspecified: Secondary | ICD-10-CM

## 2020-09-21 DIAGNOSIS — E1159 Type 2 diabetes mellitus with other circulatory complications: Secondary | ICD-10-CM

## 2020-09-21 DIAGNOSIS — E1169 Type 2 diabetes mellitus with other specified complication: Secondary | ICD-10-CM | POA: Diagnosis not present

## 2020-09-21 DIAGNOSIS — I152 Hypertension secondary to endocrine disorders: Secondary | ICD-10-CM

## 2020-09-21 DIAGNOSIS — Z794 Long term (current) use of insulin: Secondary | ICD-10-CM | POA: Diagnosis not present

## 2020-09-21 DIAGNOSIS — N390 Urinary tract infection, site not specified: Secondary | ICD-10-CM

## 2020-09-21 DIAGNOSIS — R6889 Other general symptoms and signs: Secondary | ICD-10-CM

## 2020-09-21 DIAGNOSIS — I10 Essential (primary) hypertension: Secondary | ICD-10-CM

## 2020-09-21 DIAGNOSIS — E1140 Type 2 diabetes mellitus with diabetic neuropathy, unspecified: Secondary | ICD-10-CM

## 2020-09-21 LAB — POCT GLYCOSYLATED HEMOGLOBIN (HGB A1C): Hemoglobin A1C: 7.3 % — AB (ref 4.0–5.6)

## 2020-09-21 MED ORDER — METFORMIN HCL ER 500 MG PO TB24: 1000.0000 mg | ORAL_TABLET | Freq: Two times a day (BID) | ORAL | 3 refills | Status: DC

## 2020-09-21 MED ORDER — ENALAPRIL MALEATE 20 MG PO TABS: 20.0000 mg | ORAL_TABLET | Freq: Every day | ORAL | 3 refills | Status: DC

## 2020-09-21 MED ORDER — ATORVASTATIN CALCIUM 40 MG PO TABS: 40.0000 mg | ORAL_TABLET | Freq: Every day | ORAL | 3 refills | Status: DC

## 2020-09-21 NOTE — Patient Instructions (Addendum)
It was a pleasure to see you today!  Thank you for choosing [facility] for your primary care.    Our plans for today were:  Diabetes: Continue Lantus 10 units daily and Metformin 1000mg  twice a day  UTI: She has a UTI. Her urine culture showed two bug: E. Coli and Klebsiella. Start taking the antibiotic called Bactrim twice a day for 3 days.   While taking the antibiotic, be sure to drink lots of fluids to help flush out the bladder  Hold the Myrbetric (mirabegron) while taking the antibiotic. Restart if your frequent urination continues.  If she is having pelvic pain or burning when she pees - you can take the Azo to help with this. Otherwise, you can hold this medicine.  Hold the Zyrtec until after the antibiotics. Restart if her frequent urination continues after completing the antibiotics  I have called in Fluconazole (Diflucan) to be used if she develops a yeast infection after she completes the antibiotic.  Blood pressure: continue the Elanapril   Cholesterol: continue Atorvastatin 40mg  daily  You should return to our clinic in 3 months for diabetes follow up.   Best Wishes,   Mina Marble, DO

## 2020-09-21 NOTE — Assessment & Plan Note (Signed)
Chronic. Transitioned to H2-blocker given long term risks.  - continue Pepcid - If inadequate control of symptoms, will restart Omeprazole

## 2020-09-21 NOTE — Assessment & Plan Note (Signed)
Chronic. Baseline Hgb 11. Last Hgb 11 in Dec. 2021. Repeat CBC obtained today per patient request. Langley Gauss acute bleed. Ferritin today to monitor for cause. Given low WBC, RBC, and Hgb - possibly decline in bone marrow cellularity with age. Will follow up results and further evaluate if indicated.

## 2020-09-21 NOTE — Assessment & Plan Note (Signed)
Chronic. LDL<70 - at goal. - continue Atorvastatin 40mg  QD

## 2020-09-21 NOTE — Assessment & Plan Note (Signed)
Chronic. Nonsmoker. On statin. Recent CMP with normal and stable kidney function. BP goal <150/90 - continue Enalapril 20mg  QD

## 2020-09-21 NOTE — Assessment & Plan Note (Signed)
Acute. Recurrent. Diabetes controlled, thus unlikely cause - Follow up with urology on 2/17 - Start Bacrim DS BID x 3 days - Ensure good hydration to help flush bladder - Continue Azo PRN for symptoms - Hold Zyrtec and Myrbetriq until UTI is treated. Restart if frequent urination continues after treatment of UTI - follow up if no improvement or worsening symptoms.

## 2020-09-21 NOTE — Assessment & Plan Note (Signed)
Due for DEXA scan however given age, unclear if benefit of starting bisphosphate Strongly encourage continue Vitamin D and calcium supplementation Continue to discuss at follow up visit

## 2020-09-22 LAB — CBC
Hematocrit: 32 % — ABNORMAL LOW (ref 34.0–46.6)
Hemoglobin: 10.5 g/dL — ABNORMAL LOW (ref 11.1–15.9)
MCH: 30.3 pg (ref 26.6–33.0)
MCHC: 32.8 g/dL (ref 31.5–35.7)
MCV: 93 fL (ref 79–97)
Platelets: 305 10*3/uL (ref 150–450)
RBC: 3.46 x10E6/uL — ABNORMAL LOW (ref 3.77–5.28)
RDW: 12.6 % (ref 11.7–15.4)
WBC: 4.4 10*3/uL (ref 3.4–10.8)

## 2020-09-22 LAB — FERRITIN: Ferritin: 30 ng/mL (ref 15–150)

## 2020-09-25 ENCOUNTER — Other Ambulatory Visit: Payer: Self-pay | Admitting: Family Medicine

## 2020-09-25 DIAGNOSIS — D509 Iron deficiency anemia, unspecified: Secondary | ICD-10-CM

## 2020-09-25 MED ORDER — FERROUS BISGLYCINATE CHELATE 28 MG PO CAPS: 1.0000 | ORAL_CAPSULE | Freq: Every day | ORAL | 11 refills | Status: DC

## 2020-09-28 ENCOUNTER — Other Ambulatory Visit: Payer: Self-pay

## 2020-09-30 ENCOUNTER — Ambulatory Visit: Payer: Medicare Other | Admitting: Audiologist

## 2020-09-30 ENCOUNTER — Ambulatory Visit (INDEPENDENT_AMBULATORY_CARE_PROVIDER_SITE_OTHER): Payer: Medicare Other | Admitting: Endocrinology

## 2020-09-30 ENCOUNTER — Other Ambulatory Visit: Payer: Self-pay

## 2020-09-30 VITALS — BP 150/74 | HR 127 | Ht 62.0 in | Wt 132.4 lb

## 2020-09-30 DIAGNOSIS — Z794 Long term (current) use of insulin: Secondary | ICD-10-CM

## 2020-09-30 DIAGNOSIS — E1140 Type 2 diabetes mellitus with diabetic neuropathy, unspecified: Secondary | ICD-10-CM | POA: Diagnosis not present

## 2020-09-30 LAB — TSH: TSH: 6.97 u[IU]/mL — ABNORMAL HIGH (ref 0.35–4.50)

## 2020-09-30 LAB — POCT GLYCOSYLATED HEMOGLOBIN (HGB A1C): Hemoglobin A1C: 8 % — AB (ref 4.0–5.6)

## 2020-09-30 LAB — T4, FREE: Free T4: 0.76 ng/dL (ref 0.60–1.60)

## 2020-09-30 MED ORDER — LEVOTHYROXINE SODIUM 25 MCG PO TABS: 25.0000 ug | ORAL_TABLET | Freq: Every day | ORAL | 3 refills | Status: DC

## 2020-09-30 MED ORDER — METFORMIN HCL ER 500 MG PO TB24: 1000.0000 mg | ORAL_TABLET | ORAL | 3 refills | Status: DC

## 2020-09-30 MED ORDER — INSULIN GLARGINE 100 UNIT/ML ~~LOC~~ SOLN
10.0000 [IU] | SUBCUTANEOUS | 11 refills | Status: DC
Start: 2020-09-30 — End: 2020-11-09

## 2020-09-30 NOTE — Progress Notes (Signed)
Subjective:    Patient ID: Hayley Fisher, female    DOB: 1930/12/16, 87 y.o.   MRN: [id number]  HPI pt is referred by Dr Tarry Kos, for diabetes.  Pt states DM was dx'ed in 6967; it is complicated by DR and PN; pt says her diet is good, but exercise is not good; she has never had GDM, pancreatitis, pancreatic surgery, or severe hypoglycemia.  Main symptom is fatigue.  Care assistant is with pt today.  Pt says she gives her own insulin, and checks her own cbg.  Pt says cbg varies from 65-300.  It is in general higher as the day goes on.  She prefers vial over pen.  Past Medical History:  Diagnosis Date  . Arthritis   . Arthritis   . Cataract    bil cateracts removed  . Chronic kidney disease    "spot on one of my kidneys" per pt  . Diabetes mellitus   . Glaucoma   . Hyperkalemia 07/02/2020  . Hyperlipidemia   . Hyperplastic colon polyp   . Hypertension   . Metabolic acidosis 89/38/1017  . Syncope     Past Surgical History:  Procedure Laterality Date  . ABDOMINAL HYSTERECTOMY    . BLADDER SUSPENSION    . bladder tacking    . COLONOSCOPY    . EYE SURGERY    . INGUINAL HERNIA REPAIR Right     Social History   Socioeconomic History  . Marital status: Single    Spouse name: Not on file  . Number of children: 4  . Years of education: Not on file  . Highest education level: Not on file  Occupational History  . Occupation: retired  Tobacco Use  . Smoking status: Former Smoker    Types: Cigarettes    Quit date: 11/29/1980    Years since quitting: 39.8  . Smokeless tobacco: Never Used  Vaping Use  . Vaping Use: Never used  Substance and Sexual Activity  . Alcohol use: No    Alcohol/week: 0.0 standard drinks  . Drug use: No  . Sexual activity: Yes    Birth control/protection: None  Other Topics Concern  . Not on file  Social History Narrative  . Not on file   Social Determinants of Health   Financial Resource Strain: Not on file  Food Insecurity: Not on file   Transportation Needs: Not on file  Physical Activity: Not on file  Stress: Not on file  Social Connections: Not on file  Intimate Partner Violence: Not on file    Current Outpatient Medications on File Prior to Visit  Medication Sig Dispense Refill  . ACCU-CHEK AVIVA PLUS test strip USE TO TEST BLOOD SUGAR UP TO 3 TIMES A DAY. 300 strip 3  . Accu-Chek Softclix Lancets lancets USE TO TEST BLOOD SUGAR UP TO 3 TIMES A DAY. 300 each 3  . Alcohol Swabs PADS 1 Piece by Does not apply route daily. 120 each 2  . aspirin 81 MG chewable tablet Chew 81 mg by mouth every morning.    Marland Kitchen atorvastatin (LIPITOR) 40 MG tablet Take 1 tablet (40 mg total) by mouth daily. 90 tablet 3  . brimonidine (ALPHAGAN P) 0.1 % SOLN Place 1 drop into the left eye 2 (two) times daily.     . carbamide peroxide (DEBROX) 6.5 % OTIC solution Place 5 drops into both ears 2 (two) times daily. (Patient taking differently: Place 2 drops into both ears 2 (two) times daily.) 15 mL 0  .  dorzolamide-timolol (COSOPT) 22.3-6.8 MG/ML ophthalmic solution Place 1 drop into the left eye 2 (two) times daily.    . DULoxetine (CYMBALTA) 60 MG capsule TAKE 1 CAPSULE BY MOUTH EVERY DAY (Patient taking differently: Take 60 mg by mouth daily.) 90 capsule 0  . enalapril (VASOTEC) 20 MG tablet Take 1 tablet (20 mg total) by mouth daily. 90 tablet 3  . famotidine (PEPCID) 20 MG tablet Take 1 tablet (20 mg total) by mouth daily. If needed can take twice a day. 60 tablet 11  . Ferrous Bisglycinate Chelate 28 MG CAPS Take 1 tablet by mouth daily. 90 capsule 11  . fluconazole (DIFLUCAN) 150 MG tablet Take one tablet (150mg ) now, then take one tablet in 72 hours. 2 tablet 0  . Insulin Syringe-Needle U-100 (BD INSULIN SYRINGE U/F) 31G X 5/16" 0.3 ML MISC 1 Syringe by Does not apply route daily. 100 each 1  . mirabegron ER (MYRBETRIQ) 25 MG TB24 tablet Take 1 tablet (25 mg total) by mouth daily. 30 tablet 1  . ondansetron (ZOFRAN ODT) 4 MG disintegrating  tablet Take 1 tablet (4 mg total) by mouth every 8 (eight) hours as needed for nausea. 10 tablet 0  . VYZULTA 0.024 % SOLN Place 1 drop into the right eye daily.  0   No current facility-administered medications on file prior to visit.    Allergies  Allergen Reactions  . Levemir [Insulin Detemir] Itching    Family History  Problem Relation Age of Onset  . Colon cancer Mother   . Other Father        cerebral hemorrhage  . Diabetes Brother   . Diabetes Sister        x 2  . Bone cancer Daughter   . Breast cancer Daughter 53  . Esophageal cancer Neg Hx   . Rectal cancer Neg Hx   . Stomach cancer Neg Hx     BP (!) 150/74 (BP Location: Right Arm, Patient Position: Sitting, Cuff Size: Normal)   Pulse (!) 127   Ht 5\' 2"  (1.575 m)   Wt 132 lb 6.4 oz (60.1 kg)   SpO2 93%   BMI 24.22 kg/m   Review of Systems denies chest pain, sob, n/v, and depression.  She has regained the weight she lost in late 2021.  She has mild memory loss.    Objective:   Physical Exam VITAL SIGNS:  See vs page GENERAL: no distress Pulses: dorsalis pedis intact bilat.   MSK: no deformity of the feet CV: no leg edema Skin:  no ulcer on the feet.  normal color and temp on the feet. Neuro: sensation is intact to touch on the feet.    Lab Results  Component Value Date   HGBA1C 7.3 (A) 09/21/2020   Lab Results  Component Value Date   CREATININE 0.69 09/16/2020   BUN 16 09/16/2020   NA 139 09/16/2020   K 5.0 09/16/2020   CL 98 09/16/2020   CO2 25 09/16/2020    I have reviewed outside records, and summarized: Pt was noted to have elevated A1c, and referred here.  She had DKA 11/21, and was started on insulin then.  Later, lantus was decreased to 10 units qam, due to hypoglycemia     Assessment & Plan:  Type 1 DM: she can phase out metformin, to simplify med regimen.  Tachycardia: check TFT  Patient Instructions  good diet and exercise significantly improve the control of your diabetes.   please let me know  if you wish to be referred to a dietician.  high blood sugar is very risky to your health.  you should see an eye doctor and dentist every year.  It is very important to get all recommended vaccinations.  Controlling your blood pressure and cholesterol drastically reduces the damage diabetes does to your body.  Those who smoke should quit.  Please discuss these with your doctor.  check your blood sugar 4 times a day.  vary the time of day when you check, between before the 3 meals, and at bedtime.  also check if you have symptoms of your blood sugar being too high or too low.  please keep a record of the readings and bring it to your next appointment here (or you can bring the meter itself).  You can write it on any piece of paper.  please call us sooner if your blood sugar goes below 70, or if most of your readings are over 200. Please reduce the metformin to the morning only.  Blood tests are requested for you today.  We'll let you know about the results.  Please come back for a follow-up appointment in 1 month.

## 2020-09-30 NOTE — Patient Instructions (Addendum)
good diet and exercise significantly improve the control of your diabetes.  please let me know if you wish to be referred to a dietician.  high blood sugar is very risky to your health.  you should see an eye doctor and dentist every year.  It is very important to get all recommended vaccinations.  Controlling your blood pressure and cholesterol drastically reduces the damage diabetes does to your body.  Those who smoke should quit.  Please discuss these with your doctor.  check your blood sugar 4 times a day.  vary the time of day when you check, between before the 3 meals, and at bedtime.  also check if you have symptoms of your blood sugar being too high or too low.  please keep a record of the readings and bring it to your next appointment here (or you can bring the meter itself).  You can write it on any piece of paper.  please call us sooner if your blood sugar goes below 70, or if most of your readings are over 200. Please reduce the metformin to the morning only.  Blood tests are requested for you today.  We'll let you know about the results.  Please come back for a follow-up appointment in 1 month.

## 2020-10-01 ENCOUNTER — Ambulatory Visit: Payer: Medicare Other | Attending: Family Medicine | Admitting: Audiologist

## 2020-10-01 DIAGNOSIS — H903 Sensorineural hearing loss, bilateral: Secondary | ICD-10-CM | POA: Insufficient documentation

## 2020-10-01 NOTE — Procedures (Signed)
Outpatient Audiology and [place] [place], [place]  [id number] [phone]  AUDIOLOGICAL  EVALUATION  NAME: Hayley Fisher     DOB:   08/23/1930      MRN: [id number]                                                                                     DATE: 10/01/2020     REFERENT: Danna Hefty, DO STATUS: Outpatient DIAGNOSIS: Sensorineural hearing loss, bilateral   History: Hayley Fisher was seen for an audiological evaluation. Hayley Fisher was accompanied to the appointment by her caretaker.  Hayley Fisher is receiving a hearing evaluation due to concerns for hearing loss. Hayley Fisher has difficulty hearing in background noise, crowds, and when people are at a distance. This difficulty began gradually. She had a hearing test a few years ago that showed mild loss in both ears, per her reporting.  No pain or pressure reported in either ear.  Tinnitus denied for both ears.  Medical history positive for diabetes with neuropathy which is a risk factor for hearing loss. No other relevant case history reported.    Evaluation:   Otoscopy showed significant cerumen buildup, bilaterally  Tympanometry results were consistent with normal middle ear function, bilaterally    Audiometric testing was completed using conventional audiometry with supraural transducer. Speech Recognition Thresholds were consistent with pure tone averages. Word Recognition was excellent at an elevated level. Pure tone thresholds show normal sloping to severe sensorineural hearing loss in both ears. Test results are consistent with presbycusis.   Results:  The test results were reviewed with Hayley Fisher and her caretaker. She was counseled on the nature and degree of hers hearing loss. She was provided with a copy of her audiogram that illustrate her degree of hearing loss in both ears. Her hearing loss is in the high frequencies preventing [place] from hearing high frequency consonants such as /s/ /sh/  /f/ /t/ and /th/. These sounds help differentiate the words she hears. Without these sounds, speech is muffled and unclear unless someone is face to face within 5 feet without a mask. Hayley Fisher is an excellent hearing aid candidate. With hearing aids the clarity of speech will improve and Hayley Fisher will not have to work so hard to hear. Hayley Fisher was provided with a list of audiologists that dispense hearing aids.   Recommendations: 1. Amplification is necessary for both ears. Hearing aids can be purchased from a variety of locations. See provided list for locations in the [place] area.  2. TV Ears and Caption Call phone recommended. However, patient does not have Internet in the home. Use of closed captions on TV recommended.  3. Use Debrox with bulb to regularly remove wax.  Debrox Earwax Removal Drops are a safe and inexpensive in-home solution for wax removal. Debrox Earwax Removal Kit includes a soft rubber bulb syringe to rinse your ear after using Debrox Earwax Removal Drops. Excessive earwax build-up can lead to ear discomfort and reduced hearing, which can affect your day-to-day life. The kit can be purchased over the counter at [facility], [facility], [facility], and most other pharmacies.  How to use the Debrox Earwax Removal Drops Kit:  4. tilt  head sideways. 5. place 5 to 10 drops into ear. 6. tip of applicator should not enter ear canal. 7. keep drops in ear for several minutes by keeping head tilted or placing cotton in the ear. 8. use twice daily for up to four days  9. gently flush ear with water, using soft rubber bulb syringe after final treatment (on 4th day)    Laurence Ferrari, Au.D., CCC-A 10/01/2020  11:48 AM  Cc: Danna Hefty, DO

## 2020-10-08 DIAGNOSIS — R3914 Feeling of incomplete bladder emptying: Secondary | ICD-10-CM | POA: Diagnosis not present

## 2020-10-08 DIAGNOSIS — N8111 Cystocele, midline: Secondary | ICD-10-CM | POA: Diagnosis not present

## 2020-10-08 DIAGNOSIS — N390 Urinary tract infection, site not specified: Secondary | ICD-10-CM | POA: Diagnosis not present

## 2020-10-15 DIAGNOSIS — R3914 Feeling of incomplete bladder emptying: Secondary | ICD-10-CM | POA: Diagnosis not present

## 2020-10-20 DIAGNOSIS — R413 Other amnesia: Secondary | ICD-10-CM | POA: Insufficient documentation

## 2020-10-22 ENCOUNTER — Other Ambulatory Visit: Payer: Self-pay

## 2020-10-22 ENCOUNTER — Encounter (HOSPITAL_COMMUNITY): Payer: Self-pay | Admitting: Emergency Medicine

## 2020-10-22 ENCOUNTER — Emergency Department (HOSPITAL_COMMUNITY)
Admission: EM | Admit: 2020-10-22 | Discharge: 2020-10-23 | Disposition: A | Discharge status: Home / Self Care | Payer: Medicare Other | Attending: Emergency Medicine | Admitting: Emergency Medicine

## 2020-10-22 DIAGNOSIS — Z5321 Procedure and treatment not carried out due to patient leaving prior to being seen by health care provider: Secondary | ICD-10-CM | POA: Insufficient documentation

## 2020-10-22 DIAGNOSIS — N3020 Other chronic cystitis without hematuria: Secondary | ICD-10-CM | POA: Diagnosis not present

## 2020-10-22 DIAGNOSIS — R319 Hematuria, unspecified: Secondary | ICD-10-CM | POA: Insufficient documentation

## 2020-10-22 DIAGNOSIS — R300 Dysuria: Secondary | ICD-10-CM | POA: Insufficient documentation

## 2020-10-22 DIAGNOSIS — R109 Unspecified abdominal pain: Secondary | ICD-10-CM | POA: Diagnosis not present

## 2020-10-22 DIAGNOSIS — R3914 Feeling of incomplete bladder emptying: Secondary | ICD-10-CM | POA: Diagnosis not present

## 2020-10-22 LAB — BASIC METABOLIC PANEL
Anion gap: 15 (ref 5–15)
BUN: 19 mg/dL (ref 8–23)
CO2: 24 mmol/L (ref 22–32)
Calcium: 8.5 mg/dL — ABNORMAL LOW (ref 8.9–10.3)
Chloride: 95 mmol/L — ABNORMAL LOW (ref 98–111)
Creatinine, Ser: 0.78 mg/dL (ref 0.44–1.00)
GFR, Estimated: >60 mL/min (ref >60)
Glucose, Bld: 255 mg/dL — ABNORMAL HIGH (ref 70–99)
Potassium: 4.3 mmol/L (ref 3.5–5.1)
Sodium: 134 mmol/L — ABNORMAL LOW (ref 135–145)

## 2020-10-22 LAB — URINALYSIS, ROUTINE W REFLEX MICROSCOPIC
Bacteria, UA: NONE SEEN
Bilirubin Urine: NEGATIVE
Glucose, UA: 150 mg/dL — AB
Ketones, ur: NEGATIVE mg/dL
Nitrite: NEGATIVE
Protein, ur: 100 mg/dL — AB
RBC / HPF: >50 RBC/hpf — ABNORMAL HIGH (ref 0–5)
Specific Gravity, Urine: 1.019 (ref 1.005–1.030)
WBC, UA: >50 WBC/hpf — ABNORMAL HIGH (ref 0–5)
pH: 5 (ref 5.0–8.0)

## 2020-10-22 LAB — CBC WITH DIFFERENTIAL/PLATELET
Abs Immature Granulocytes: 0.02 10*3/uL (ref 0.00–0.07)
Basophils Absolute: 0 10*3/uL (ref 0.0–0.1)
Basophils Relative: 0 %
Eosinophils Absolute: 0 10*3/uL (ref 0.0–0.5)
Eosinophils Relative: 1 %
HCT: 31.9 % — ABNORMAL LOW (ref 36.0–46.0)
Hemoglobin: 10.3 g/dL — ABNORMAL LOW (ref 12.0–15.0)
Immature Granulocytes: 1 %
Lymphocytes Relative: 26 %
Lymphs Abs: 0.8 10*3/uL (ref 0.7–4.0)
MCH: 30.9 pg (ref 26.0–34.0)
MCHC: 32.3 g/dL (ref 30.0–36.0)
MCV: 95.8 fL (ref 80.0–100.0)
Monocytes Absolute: 0.4 10*3/uL (ref 0.1–1.0)
Monocytes Relative: 14 %
Neutro Abs: 1.9 10*3/uL (ref 1.7–7.7)
Neutrophils Relative %: 58 %
Platelets: 227 10*3/uL (ref 150–400)
RBC: 3.33 MIL/uL — ABNORMAL LOW (ref 3.87–5.11)
RDW: 14.6 % (ref 11.5–15.5)
WBC: 3.2 10*3/uL — ABNORMAL LOW (ref 4.0–10.5)
nRBC: 0 % (ref 0.0–0.2)

## 2020-10-22 NOTE — ED Triage Notes (Signed)
Patient reports bloody urine at foley catheter drainage bag today ( inserted today ) with dysuria , currently taking oral antibiotic . No fever or chills .

## 2020-10-22 NOTE — ED Notes (Signed)
Pt friend/family wit her states she is taking her home, LWBS

## 2020-10-23 ENCOUNTER — Telehealth: Payer: Self-pay

## 2020-10-23 ENCOUNTER — Ambulatory Visit (INDEPENDENT_AMBULATORY_CARE_PROVIDER_SITE_OTHER)
Admission: EM | Admit: 2020-10-23 | Discharge: 2020-10-23 | Disposition: A | Discharge status: Home / Self Care | Payer: Medicare Other | Source: Home / Self Care

## 2020-10-23 ENCOUNTER — Other Ambulatory Visit: Payer: Self-pay

## 2020-10-23 ENCOUNTER — Encounter: Payer: Self-pay | Admitting: Family Medicine

## 2020-10-23 ENCOUNTER — Ambulatory Visit (INDEPENDENT_AMBULATORY_CARE_PROVIDER_SITE_OTHER): Payer: Medicare Other

## 2020-10-23 ENCOUNTER — Encounter (HOSPITAL_COMMUNITY): Payer: Self-pay

## 2020-10-23 DIAGNOSIS — K6289 Other specified diseases of anus and rectum: Secondary | ICD-10-CM

## 2020-10-23 DIAGNOSIS — R319 Hematuria, unspecified: Secondary | ICD-10-CM

## 2020-10-23 DIAGNOSIS — R102 Pelvic and perineal pain: Secondary | ICD-10-CM

## 2020-10-23 DIAGNOSIS — R109 Unspecified abdominal pain: Secondary | ICD-10-CM | POA: Diagnosis not present

## 2020-10-23 DIAGNOSIS — K529 Noninfective gastroenteritis and colitis, unspecified: Secondary | ICD-10-CM

## 2020-10-23 DIAGNOSIS — K5900 Constipation, unspecified: Secondary | ICD-10-CM

## 2020-10-23 HISTORY — DX: Disorder of thyroid, unspecified: E07.9

## 2020-10-23 LAB — CBC WITH DIFFERENTIAL/PLATELET
Abs Immature Granulocytes: 0.03 10*3/uL (ref 0.00–0.07)
Basophils Absolute: 0 10*3/uL (ref 0.0–0.1)
Basophils Relative: 0 %
Eosinophils Absolute: 0 10*3/uL (ref 0.0–0.5)
Eosinophils Relative: 1 %
HCT: 31.5 % — ABNORMAL LOW (ref 36.0–46.0)
Hemoglobin: 10.5 g/dL — ABNORMAL LOW (ref 12.0–15.0)
Immature Granulocytes: 1 %
Lymphocytes Relative: 30 %
Lymphs Abs: 0.9 10*3/uL (ref 0.7–4.0)
MCH: 31.3 pg (ref 26.0–34.0)
MCHC: 33.3 g/dL (ref 30.0–36.0)
MCV: 93.8 fL (ref 80.0–100.0)
Monocytes Absolute: 0.3 10*3/uL (ref 0.1–1.0)
Monocytes Relative: 11 %
Neutro Abs: 1.7 10*3/uL (ref 1.7–7.7)
Neutrophils Relative %: 57 %
Platelets: 228 10*3/uL (ref 150–400)
RBC: 3.36 MIL/uL — ABNORMAL LOW (ref 3.87–5.11)
RDW: 14.6 % (ref 11.5–15.5)
WBC: 2.9 10*3/uL — ABNORMAL LOW (ref 4.0–10.5)
nRBC: 0 % (ref 0.0–0.2)

## 2020-10-23 LAB — POCT URINALYSIS DIPSTICK, ED / UC
Bilirubin Urine: NEGATIVE
Glucose, UA: NEGATIVE mg/dL
Ketones, ur: NEGATIVE mg/dL
Nitrite: NEGATIVE
Protein, ur: >=300 mg/dL — AB
Specific Gravity, Urine: 1.015 (ref 1.005–1.030)
Urobilinogen, UA: 1 mg/dL (ref 0.0–1.0)
pH: 6.5 (ref 5.0–8.0)

## 2020-10-23 LAB — COMPREHENSIVE METABOLIC PANEL
ALT: 16 U/L (ref 0–44)
AST: 20 U/L (ref 15–41)
Albumin: 2.9 g/dL — ABNORMAL LOW (ref 3.5–5.0)
Alkaline Phosphatase: 51 U/L (ref 38–126)
Anion gap: 11 (ref 5–15)
BUN: 13 mg/dL (ref 8–23)
CO2: 27 mmol/L (ref 22–32)
Calcium: 8.7 mg/dL — ABNORMAL LOW (ref 8.9–10.3)
Chloride: 97 mmol/L — ABNORMAL LOW (ref 98–111)
Creatinine, Ser: 0.7 mg/dL (ref 0.44–1.00)
GFR, Estimated: >60 mL/min (ref >60)
Glucose, Bld: 130 mg/dL — ABNORMAL HIGH (ref 70–99)
Potassium: 4.7 mmol/L (ref 3.5–5.1)
Sodium: 135 mmol/L (ref 135–145)
Total Bilirubin: 0.6 mg/dL (ref 0.3–1.2)
Total Protein: 5.8 g/dL — ABNORMAL LOW (ref 6.5–8.1)

## 2020-10-23 MED ORDER — POLYETHYLENE GLYCOL 3350 17 GM/SCOOP PO POWD: 17.0000 g | Freq: Every day | ORAL | 0 refills | Status: DC

## 2020-10-23 MED ORDER — BISACODYL 10 MG RE SUPP
10.0000 mg | RECTAL | 0 refills | Status: DC | PRN
Start: 2020-10-23 — End: 2020-12-15

## 2020-10-23 NOTE — Telephone Encounter (Signed)
Im not able to call patient at the moment as I am in clinic. I would recommend she reach out to the urology office if she is having bleeding and leaking from the catheter. If she is in severe pain, she should likely be seen in ED (possibly shorter wait time at [facility] or [facility]).

## 2020-10-23 NOTE — ED Provider Notes (Signed)
[address]    CSN: [id number] Arrival date & time: 10/23/20  1256      History   Chief Complaint Chief Complaint  Patient presents with  . Hematuria  . Vaginal Pain  . Rectal Pain    HPI Hayley Fisher is a 87 y.o. female.   Hayley Fisher presents with her caregiver with complaints of hematuria as well as rectal and vaginal pressure. She has had diarrhea now for approximately 2 weeks. She previously had been constipated as she had been taking immodium. She stopped the immodium and now has approximately 8 stools a day, even over night it wakes her. She has a foley catheter, which is managed by urology, she had it replaced yesterday. Urine output was noted to be bloody, so doxycycline was initiated for concern for UTI. Blood/ dark urine still present. No known fevers. No nausea, vomiting or loss of appetite. She went to the ER last night and labs were drawn but she left before being seen. Caregiver states that Hayley Fisher has had complaints of much rectal pain which prompts today's visit. Prior to antibiotics started yesterday, most recent antibiotics were given approximately 1.5 months ago. Hayley Fisher is sitting comfortably on exam table on initial history and exam without acute distress.     ROS per HPI, negative if not otherwise mentioned.      Past Medical History:  Diagnosis Date  . Arthritis   . Arthritis   . Cataract    bil cateracts removed  . Chronic kidney disease    "spot on one of my kidneys" per pt  . Diabetes mellitus   . Glaucoma   . Hyperkalemia 07/02/2020  . Hyperlipidemia   . Hyperplastic colon polyp   . Hypertension   . Metabolic acidosis 14/43/1540  . Syncope   . Thyroid disease     Patient Active Problem List   Diagnosis Date Noted  . Normocytic anemia 09/21/2020  . Gastroesophageal reflux disease without esophagitis 09/16/2020  . Urinary frequency 09/11/2020  . Stage 1 decubitus ulcer 07/30/2020  . Skin ulcer of sacrum, limited to  breakdown of skin ([place]) 03/16/2017  . Chronic venous insufficiency 11/09/2015  . DDD (degenerative disc disease), lumbosacral 11/24/2014  . Frequent UTI 10/29/2012  . Type 2 diabetes mellitus with diabetic neuropathy ([place]) 10/19/2006  . Hyperlipidemia associated with type 2 diabetes mellitus ([place]) 10/19/2006  . Hypertension associated with diabetes ([place]) 10/19/2006  . Osteopenia 10/19/2006    Past Surgical History:  Procedure Laterality Date  . ABDOMINAL HYSTERECTOMY    . BLADDER SUSPENSION    . bladder tacking    . COLONOSCOPY    . EYE SURGERY    . INGUINAL HERNIA REPAIR Right     OB History   No obstetric history on file.      Home Medications    Prior to Admission medications   Medication Sig Start Date End Date Taking? Authorizing Provider  bisacodyl (DULCOLAX) 10 MG suppository Place 1 suppository (10 mg total) rectally as needed for moderate constipation. 10/23/20  Yes Gwendolyne Welford, Lanelle Bal B, NP  ferrous sulfate 324 MG TBEC Take 65 mg by mouth.   Yes [provider]  polyethylene glycol powder (GLYCOLAX/MIRALAX) 17 GM/SCOOP powder Take 17 g by mouth daily. 10/23/20  Yes Aleysia Oltmann, Lanelle Bal B, NP  ACCU-CHEK AVIVA PLUS test strip USE TO TEST BLOOD SUGAR UP TO 3 TIMES A DAY. 06/30/20   Mullis, Kiersten P, DO  Accu-Chek Softclix Lancets lancets USE TO TEST BLOOD SUGAR UP TO 3  TIMES A DAY. 06/30/20   Mullis, Kiersten P, DO  Alcohol Swabs PADS 1 Piece by Does not apply route daily. 08/10/20   Mullis, Kiersten P, DO  aspirin 81 MG chewable tablet Chew 81 mg by mouth every morning.    [provider]  atorvastatin (LIPITOR) 40 MG tablet Take 1 tablet (40 mg total) by mouth daily. 09/21/20   Mullis, Kiersten P, DO  brimonidine (ALPHAGAN P) 0.1 % SOLN Place 1 drop into the left eye 2 (two) times daily.  09/20/11     carbamide peroxide (DEBROX) 6.5 % OTIC solution Place 5 drops into both ears 2 (two) times daily. Patient taking differently: Place 2 drops into both ears 2 (two) times  daily. 10/16/18   Alveda Reasons, MD  dorzolamide-timolol (COSOPT) 22.3-6.8 MG/ML ophthalmic solution Place 1 drop into the left eye 2 (two) times daily. 05/18/20   [provider]  DULoxetine (CYMBALTA) 60 MG capsule TAKE 1 CAPSULE BY MOUTH EVERY DAY Patient taking differently: Take 60 mg by mouth daily. 10/22/18   Harriet Butte, DO  enalapril (VASOTEC) 20 MG tablet Take 1 tablet (20 mg total) by mouth daily. 09/21/20   Mullis, Kiersten P, DO  famotidine (PEPCID) 20 MG tablet Take 1 tablet (20 mg total) by mouth daily. If needed can take twice a day. 09/16/20   Mullis, Kiersten P, DO  Ferrous Bisglycinate Chelate 28 MG CAPS Take 1 tablet by mouth daily. 09/25/20   Mullis, Kiersten P, DO  fluconazole (DIFLUCAN) 150 MG tablet Take one tablet (150mg ) now, then take one tablet in 72 hours. 09/18/20   Mullis, Kiersten P, DO  insulin glargine (LANTUS) 100 UNIT/ML injection Inject 0.1 mLs (10 Units total) into the skin every morning. 09/30/20   Renato Shin, MD  Insulin Syringe-Needle U-100 (BD INSULIN SYRINGE U/F) 31G X 5/16" 0.3 ML MISC 1 Syringe by Does not apply route daily. 08/10/20   Mullis, Kiersten P, DO  levothyroxine (SYNTHROID) 25 MCG tablet Take 1 tablet (25 mcg total) by mouth daily before breakfast. 09/30/20   Renato Shin, MD  metFORMIN (GLUCOPHAGE-XR) 500 MG 24 hr tablet Take 2 tablets (1,000 mg total) by mouth every morning. 09/30/20   Renato Shin, MD  mirabegron ER (MYRBETRIQ) 25 MG TB24 tablet Take 1 tablet (25 mg total) by mouth daily. 09/16/20   Mullis, Kiersten P, DO  ondansetron (ZOFRAN ODT) 4 MG disintegrating tablet Take 1 tablet (4 mg total) by mouth every 8 (eight) hours as needed for nausea. 07/28/20   Larene Pickett, PA-C  tamsulosin (FLOMAX) 0.4 MG CAPS capsule Take 0.4 mg by mouth daily. 10/08/20   [provider]  VYZULTA 0.024 % SOLN Place 1 drop into the right eye daily. 11/06/17   [provider]    Family History Family History  Problem Relation Age  of Onset  . Colon cancer Mother   . Other Father        cerebral hemorrhage  . Diabetes Brother   . Diabetes Sister        x 2  . Bone cancer Daughter   . Breast cancer Daughter 72  . Esophageal cancer Neg Hx   . Rectal cancer Neg Hx   . Stomach cancer Neg Hx     Social History Social History   Tobacco Use  . Smoking status: Former Smoker    Types: Cigarettes    Quit date: 11/29/1980    Years since quitting: 39.9  . Smokeless tobacco: Never Used  Vaping  Use  . Vaping Use: Never used  Substance Use Topics  . Alcohol use: No    Alcohol/week: 0.0 standard drinks  . Drug use: No     Allergies   Levemir [insulin detemir]   Review of Systems Review of Systems   Physical Exam Triage Vital Signs ED Triage Vitals  Enc Vitals Group     BP 10/23/20 1344 134/75     Pulse Rate 10/23/20 1344 (!) 101     Resp 10/23/20 1344 17     Temp 10/23/20 1344 98.2 F (36.8 C)     Temp src --      SpO2 10/23/20 1344 97 %     Weight --      Height --      Head Circumference --      Peak Flow --      Pain Score 10/23/20 1339 10     Pain Loc --      Pain Edu? --      Excl. in [place]? --    No data found.  Updated Vital Signs BP 134/75   Pulse (!) 101   Temp 98.2 F (36.8 C)   Resp 17   SpO2 97%   Visual Acuity Right Eye Distance:   Left Eye Distance:   Bilateral Distance:    Right Eye Near:   Left Eye Near:    Bilateral Near:     Physical Exam Exam conducted with a chaperone present.  Constitutional:      General: She is not in acute distress.    Appearance: She is well-developed.     Comments: Sitting comfortably on exam table in no distress   Cardiovascular:     Rate and Rhythm: Normal rate.  Pulmonary:     Effort: Pulmonary effort is normal.  Genitourinary:    Comments: External vaginal and vulva without visible abnormalities or any visible prolapse; rectum with some external hemorrhoids but otherwise without visible prolapse or abscess; no abdnormal findings  on DRE or any gross blood or palpable stool; foley catheter in place with amber urine present; stool collected is solid small stool with small amount of mucus, no liquid stool Skin:    General: Skin is warm and dry.  Neurological:     Mental Status: She is alert and oriented to person, place, and time.      UC Treatments / Results  Labs (all labs ordered are listed, but only abnormal results are displayed) Labs Reviewed  COMPREHENSIVE METABOLIC PANEL - Abnormal; Notable for the following components:      Result Value   Chloride 97 (*)    Glucose, Bld 130 (*)    Calcium 8.7 (*)    Total Protein 5.8 (*)    Albumin 2.9 (*)    All other components within normal limits  CBC WITH DIFFERENTIAL/PLATELET - Abnormal; Notable for the following components:   WBC 2.9 (*)    RBC 3.36 (*)    Hemoglobin 10.5 (*)    HCT 31.5 (*)    All other components within normal limits  POCT URINALYSIS DIPSTICK, ED / UC - Abnormal; Notable for the following components:   Hgb urine dipstick LARGE (*)    Protein, ur >=300 (*)    Leukocytes,Ua SMALL (*)    All other components within normal limits  URINE CULTURE  GASTROINTESTINAL PANEL BY PCR, STOOL (REPLACES STOOL CULTURE)    EKG   Radiology DG Abd 2 Views  Result Date: 10/23/2020 CLINICAL DATA:  Hematuria, pelvic, rectal and vaginal pain for a few days. EXAM: ABDOMEN - 2 VIEW COMPARISON:  CT abdomen and pelvis 07/27/2020. FINDINGS: No free intraperitoneal air is identified. The bowel gas pattern is nonobstructive. There is a large volume of stool throughout the colon. Aortic atherosclerosis is noted. No acute bony abnormality. Convex left scoliosis and multilevel lumbar degenerative change. IMPRESSION: No acute finding. Large volume of stool throughout the colon. Electronically Signed   By: Inge Rise M.D.   On: 10/23/2020 15:32    Procedures Procedures (including critical care time)  Medications Ordered in UC Medications - No data to  display  Initial Impression / Assessment and Plan / UC Course  I have reviewed the triage vital signs and the nursing notes.  Pertinent labs & imaging results that were available during my care of the patient were reviewed by me and considered in my medical decision making (see chart for details).     Patient is comfortable appearing without acute distress or pain here in clinic. Exam is overall benign. Labs without acute findings. On antibiotics already for UTI, to continue these. Large stool burden on plain films- I suspect this is source of pain, pressure and stool frequency. Stool here is not liquid, deferred cdif testing. Constipation treatment discussed. Caregiver with concerns about leaking catheter, nursing staff reinflated balloon to try to troubleshoot this. They state they have appointment next week with PCP and urology already. Er precautions provided. Patient and caregiver verbalized understanding and agreeable to plan.   Final Clinical Impressions(s) / UC Diagnoses   Final diagnoses:  Constipation, unspecified constipation type  Rectal pain  Hematuria, unspecified type     Discharge Instructions     There is a large amount of stool visible on xray which I feel is contributing to your frequent stool as well as the pain you are experiencing.  You may try a suppository to see if this is helpful in passing stool and relieving rectal pain.  Miralax daily with increased water intake can also help to promote a large stool  Drink plenty of water and fiber in your diet to prevent constipation.  Continue with the antibiotics you have previously been prescribed.  Go to the ER for any worsening of symptoms.  Your basic labs today look well.     ED Prescriptions    Medication Sig Dispense Auth. Provider   bisacodyl (DULCOLAX) 10 MG suppository Place 1 suppository (10 mg total) rectally as needed for moderate constipation. 12 suppository Sahvanna Mcmanigal B, NP   polyethylene glycol  powder (GLYCOLAX/MIRALAX) 17 GM/SCOOP powder Take 17 g by mouth daily. 255 g Zigmund Gottron, NP     PDMP not reviewed this encounter.   Zigmund Gottron, NP 10/23/20 1555

## 2020-10-23 NOTE — Telephone Encounter (Signed)
Patients daughter calls nurse line requesting I call patients caregiver to discuss "some things," as patients daughter lives in [place]. I called Verdene Lennert (patients caregiver) and she reported increased pain in patients hip and UTI infection. Verdene Lennert reported they went to her Urologist yesterday and was told to followup with PCP office for vaginal polyps and "rectal issues." Verdene Lennert reported they also gave her an antibiotic shot and sent her home with doxycycline 100mg  BID for 14 days. Verdene Lennert reports she is unsure why, urology just stated she has an infection, she assumes UTI. Patient had blood in her bag yesterday, however has resolved once she started antibiotics. Verdene Lennert is more concerned for increase pain in her hip. I could hear the patient crying in the background. Verdene Lennert stated she has been crying all night. Verdene Lennert took her to ED last night, however the wait time was long so they left. Apt with [facility] scheduled for Monday. Verdene Lennert advised to take her to [facility] for pain control. Veronica voiced understanding.

## 2020-10-23 NOTE — Discharge Instructions (Signed)
There is a large amount of stool visible on xray which I feel is contributing to your frequent stool as well as the pain you are experiencing.  You may try a suppository to see if this is helpful in passing stool and relieving rectal pain.  Miralax daily with increased water intake can also help to promote a large stool  Drink plenty of water and fiber in your diet to prevent constipation.  Continue with the antibiotics you have previously been prescribed.  Go to the ER for any worsening of symptoms.  Your basic labs today look well.

## 2020-10-23 NOTE — ED Triage Notes (Signed)
Pt in with c/o hematuria and vaginal pain that has been going on for a few days. Also c/o anal pain.  Pt states that it feels like a lot of pressure in her anus  Pt has not had medication for pain

## 2020-10-24 LAB — GASTROINTESTINAL PANEL BY PCR, STOOL (REPLACES STOOL CULTURE)

## 2020-10-24 LAB — URINE CULTURE: Culture: NO GROWTH

## 2020-10-26 ENCOUNTER — Other Ambulatory Visit: Payer: Self-pay

## 2020-10-26 ENCOUNTER — Ambulatory Visit (INDEPENDENT_AMBULATORY_CARE_PROVIDER_SITE_OTHER): Payer: Medicare Other | Admitting: Student in an Organized Health Care Education/Training Program

## 2020-10-26 DIAGNOSIS — K5901 Slow transit constipation: Secondary | ICD-10-CM | POA: Diagnosis not present

## 2020-10-26 DIAGNOSIS — N3001 Acute cystitis with hematuria: Secondary | ICD-10-CM

## 2020-10-26 MED ORDER — CATHETER SELF-ADHESIVE URINARY MISC: 1.0000 | Freq: Every day | 0 refills | Status: DC

## 2020-10-26 MED ORDER — SENNA 8.6 MG PO TABS: 1.0000 | ORAL_TABLET | Freq: Every day | ORAL | 0 refills | Status: DC

## 2020-10-26 NOTE — Progress Notes (Signed)
   SUBJECTIVE:   CHIEF COMPLAINT / HPI: f/u ED  Foley catheter-patient was having discomfort, leakage, bleeding with Foley catheter and was seen in ED on 3/3 where they replaced her Foley catheter and initiated treatment for UTI.  Patient endorses great improvement in her comfort level with the catheter where it is placed now and has not noticed any more bleeding.  She continues to be adherent with the antibiotic treatment.  She would like for me to recheck the placement of the Foley today.  Constipation-at urgent care on 3/4 patient was diagnosed with constipation based off physical exam and KUB.  She was prescribed MiraLAX and suppositories.  Patient's daughter has been giving her MiraLAX up to 3 times per day and gave her 1 suppository yesterday which came out with small bowel movement today.  Patient states that she is not uncomfortable or feeling backed up or full of stool at the moment.  She is having very small amount of stool described as a palpable amount daily.  She denies straining with bowel movements. She and her daughter endorse that she has a healthy diet and eats a fair amount of food and drinks a lot of water and ginger ale, Cranberry juice Eats a lot of fruits, apples, bananas, oranges. Banana couple times a week Almond milk in cereal. Small amount of cheese.   OBJECTIVE:   BP 118/75   Pulse (!) 117   Wt 135 lb (61.2 kg)   SpO2 96%   BMI 24.69 kg/m   Daughter was in room for sensitive exam General: NAD, pleasant, able to participate in exam Cardiac: RRR, normal heart sounds, positive systolic ejection murmur 2+ radial and PT pulses bilaterally Respiratory: CTAB, normal effort, No wheezes, rales or rhonchi Abdomen: soft, nontender, nondistended, no hepatic or splenomegaly, +BS  GU: External exam of Foley catheter revealed proper placement and drainage of straw-colored urine without frank blood. Extremities: Trace edema. WWP. Skin: warm and dry, no rashes noted Neuro:  alert and oriented, no focal deficits Psych: Normal affect and mood  ASSESSMENT/PLAN:   UTI (urinary tract infection) Patient currently on antibiotic treatment and has Foley catheter in place and draining well. Patient endorses mild discomfort with movement of the Foley Prescribed adhesive pads to place on patient's leg and minimize the amount of commotion with the Foley Follow-up with urology 3/10.  Caregiver states that they plan to remove the Foley at that visit  Constipation by delayed colonic transit Chronic constipation.  Patient failed MiraLAX and suppository x1 Based on chart review from PCP and urgent care provider, it does not appear that patient has obstructive bowel but more likely slow transit.  Patient has history of hemorrhoids and likely rectocele which would exacerbate her condition -Recommended continuing MiraLAX and suppository as needed but also paring with 1-2 times per day senna which is prescribed today.  -Continue drinking plenty of water and decrease bananas and dairy in diet and increase fiber -Consider referral to gynecologist for evaluation of rectocele  -S/p total abdominal hysterectomy ~1998. Has a history of atrophic vaginitis with cystocele.        [place]

## 2020-10-26 NOTE — Patient Instructions (Addendum)
It was a pleasure to see you today!  To summarize our discussion for this visit:  I am prescribing an additional laxative that is a stimulant to pair with the miralax.   Continue drinking plenty of water. Cut back on bananas and try to eat more leafy vegetables.   I sent in some adhesive patches to help keep your catheter in place. Please follow up with your urologist this week to discuss when you can discontinue the catheter.   Some additional health maintenance measures we should update are: Health Maintenance Due  Topic Date Due  . OPHTHALMOLOGY EXAM  10/23/2020  .   Please let me know in a week or so if you are not seeing improvement.  Call the clinic at [phone] if your symptoms worsen or you have any concerns.   Thank you for allowing me to take part in your care,  Dr. Doristine Mango

## 2020-10-27 ENCOUNTER — Encounter: Payer: Self-pay | Admitting: Student in an Organized Health Care Education/Training Program

## 2020-10-27 NOTE — Assessment & Plan Note (Signed)
Patient currently on antibiotic treatment and has Foley catheter in place and draining well. Patient endorses mild discomfort with movement of the Foley Prescribed adhesive pads to place on patient's leg and minimize the amount of commotion with the Foley Follow-up with urology 3/10.  Caregiver states that they plan to remove the Foley at that visit

## 2020-10-27 NOTE — Assessment & Plan Note (Addendum)
Chronic constipation.  Patient failed MiraLAX and suppository x1 Based on chart review from PCP and urgent care provider, it does not appear that patient has obstructive bowel but more likely slow transit.  Patient has history of hemorrhoids and likely rectocele which would exacerbate her condition -Recommended continuing MiraLAX and suppository as needed but also paring with 1-2 times per day senna which is prescribed today.  -Continue drinking plenty of water and decrease bananas and dairy in diet and increase fiber -Consider referral to gynecologist for evaluation of rectocele  -S/p total abdominal hysterectomy ~1998. Has a history of atrophic vaginitis with cystocele.

## 2020-11-03 ENCOUNTER — Ambulatory Visit: Payer: Medicare Other | Admitting: Endocrinology

## 2020-11-04 DIAGNOSIS — R3914 Feeling of incomplete bladder emptying: Secondary | ICD-10-CM | POA: Diagnosis not present

## 2020-11-04 DIAGNOSIS — N139 Obstructive and reflux uropathy, unspecified: Secondary | ICD-10-CM | POA: Diagnosis not present

## 2020-11-04 DIAGNOSIS — R8271 Bacteriuria: Secondary | ICD-10-CM | POA: Diagnosis not present

## 2020-11-05 ENCOUNTER — Other Ambulatory Visit: Payer: Self-pay | Admitting: Family Medicine

## 2020-11-05 DIAGNOSIS — E1140 Type 2 diabetes mellitus with diabetic neuropathy, unspecified: Secondary | ICD-10-CM

## 2020-11-05 DIAGNOSIS — Z794 Long term (current) use of insulin: Secondary | ICD-10-CM

## 2020-11-06 DIAGNOSIS — R3914 Feeling of incomplete bladder emptying: Secondary | ICD-10-CM | POA: Diagnosis not present

## 2020-11-06 DIAGNOSIS — N3020 Other chronic cystitis without hematuria: Secondary | ICD-10-CM | POA: Diagnosis not present

## 2020-11-06 DIAGNOSIS — R338 Other retention of urine: Secondary | ICD-10-CM | POA: Diagnosis not present

## 2020-11-09 ENCOUNTER — Ambulatory Visit (INDEPENDENT_AMBULATORY_CARE_PROVIDER_SITE_OTHER): Payer: Medicare Other | Admitting: Endocrinology

## 2020-11-09 ENCOUNTER — Other Ambulatory Visit: Payer: Self-pay

## 2020-11-09 VITALS — BP 160/90 | HR 111 | Ht 62.0 in | Wt 140.2 lb

## 2020-11-09 DIAGNOSIS — E1140 Type 2 diabetes mellitus with diabetic neuropathy, unspecified: Secondary | ICD-10-CM

## 2020-11-09 DIAGNOSIS — Z794 Long term (current) use of insulin: Secondary | ICD-10-CM | POA: Diagnosis not present

## 2020-11-09 LAB — POCT GLYCOSYLATED HEMOGLOBIN (HGB A1C): Hemoglobin A1C: 7.1 % — AB (ref 4.0–5.6)

## 2020-11-09 MED ORDER — RYBELSUS 3 MG PO TABS: 3.0000 mg | ORAL_TABLET | Freq: Every day | ORAL | 11 refills | Status: DC

## 2020-11-09 MED ORDER — METFORMIN HCL ER 500 MG PO TB24: 1000.0000 mg | ORAL_TABLET | ORAL | 3 refills | Status: DC

## 2020-11-09 NOTE — Patient Instructions (Addendum)
Your blood pressure is high today.  Please see your primary care provider soon, to have it rechecked check your blood sugar 4 times a day.  vary the time of day when you check, between before the 3 meals, and at bedtime.  also check if you have symptoms of your blood sugar being too high or too low.  please keep a record of the readings and bring it to your next appointment here (or you can bring the meter itself).  You can write it on any piece of paper.  please call us sooner if your blood sugar goes below 70, or if most of your readings are over 200.   I have sent a prescription to your pharmacy, to change the Lantus to "Rybelsus," and: Please continue the same metformin.   Blood tests are requested for you today.  We'll let you know about the results.   Please come back for a follow-up appointment in 2 months.

## 2020-11-09 NOTE — Progress Notes (Unsigned)
Subjective:    Patient ID: Hayley Fisher, female    DOB: 11/26/1930, 87 y.o.   MRN: [id number]  HPI Pt returns for f/u of diabetes mellitus: DM type: 2 Dx'ed: 2703 Complications: DR and PN  Therapy: insulin since 2021, and metofrmin GDM: never DKA: never Severe hypoglycemia: never Pancreatitis: never Pancreatic imaging: normal on 2021 CT SDOH: Care assistant is with pt for appts.  However, pt gives her own insulin, and checks her own cbg Other: She prefers vial over pen Interval history: no cbg record, but states cbg's varies from 65-200.  It is in general higher as the day goes on.  She takes metformin, 1000 units BID.  Pt also has chronic primary hypothyroidism.  She takes synthroid as rx'ed.    Past Medical History:  Diagnosis Date  . Arthritis   . Arthritis   . Cataract    bil cateracts removed  . Chronic kidney disease    "spot on one of my kidneys" per pt  . Diabetes mellitus   . Glaucoma   . Hyperkalemia 07/02/2020  . Hyperlipidemia   . Hyperplastic colon polyp   . Hypertension   . Metabolic acidosis 50/04/3817  . Syncope   . Thyroid disease     Past Surgical History:  Procedure Laterality Date  . ABDOMINAL HYSTERECTOMY    . BLADDER SUSPENSION    . bladder tacking    . COLONOSCOPY    . EYE SURGERY    . INGUINAL HERNIA REPAIR Right     Social History   Socioeconomic History  . Marital status: Single    Spouse name: Not on file  . Number of children: 4  . Years of education: Not on file  . Highest education level: Not on file  Occupational History  . Occupation: retired  Tobacco Use  . Smoking status: Former Smoker    Types: Cigarettes    Quit date: 11/29/1980    Years since quitting: 39.9  . Smokeless tobacco: Never Used  Vaping Use  . Vaping Use: Never used  Substance and Sexual Activity  . Alcohol use: No    Alcohol/week: 0.0 standard drinks  . Drug use: No  . Sexual activity: Yes    Birth control/protection: None  Other Topics  Concern  . Not on file  Social History Narrative  . Not on file   Social Determinants of Health   Financial Resource Strain: Not on file  Food Insecurity: Not on file  Transportation Needs: Not on file  Physical Activity: Not on file  Stress: Not on file  Social Connections: Not on file  Intimate Partner Violence: Not on file    Current Outpatient Medications on File Prior to Visit  Medication Sig Dispense Refill  . ACCU-CHEK AVIVA PLUS test strip USE TO TEST BLOOD SUGAR UP TO 3 TIMES A DAY. 300 strip 3  . Accu-Chek Softclix Lancets lancets USE TO TEST BLOOD SUGAR UP TO 3 TIMES A DAY. 300 each 3  . Alcohol Swabs PADS 1 Piece by Does not apply route daily. 120 each 2  . aspirin 81 MG chewable tablet Chew 81 mg by mouth every morning.    Marland Kitchen atorvastatin (LIPITOR) 40 MG tablet Take 1 tablet (40 mg total) by mouth daily. 90 tablet 3  . bisacodyl (DULCOLAX) 10 MG suppository Place 1 suppository (10 mg total) rectally as needed for moderate constipation. 12 suppository 0  . brimonidine (ALPHAGAN P) 0.1 % SOLN Place 1 drop into the left eye 2 (two)  times daily.     . carbamide peroxide (DEBROX) 6.5 % OTIC solution Place 5 drops into both ears 2 (two) times daily. (Patient taking differently: Place 2 drops into both ears 2 (two) times daily.) 15 mL 0  . Catheter Self-Adhesive Urinary MISC 1 patch by Does not apply route daily. 12 each 0  . dorzolamide-timolol (COSOPT) 22.3-6.8 MG/ML ophthalmic solution Place 1 drop into the left eye 2 (two) times daily.    . DULoxetine (CYMBALTA) 60 MG capsule TAKE 1 CAPSULE BY MOUTH EVERY DAY (Patient taking differently: Take 60 mg by mouth daily.) 90 capsule 0  . enalapril (VASOTEC) 20 MG tablet Take 1 tablet (20 mg total) by mouth daily. 90 tablet 3  . famotidine (PEPCID) 20 MG tablet Take 1 tablet (20 mg total) by mouth daily. If needed can take twice a day. 60 tablet 11  . Ferrous Bisglycinate Chelate 28 MG CAPS Take 1 tablet by mouth daily. 90 capsule 11   . ferrous sulfate 324 MG TBEC Take 65 mg by mouth.    . fluconazole (DIFLUCAN) 150 MG tablet Take one tablet (150mg ) now, then take one tablet in 72 hours. 2 tablet 0  . Insulin Syringe-Needle U-100 (BD INSULIN SYRINGE U/F) 31G X 5/16" 0.3 ML MISC 1 Syringe by Does not apply route daily. 100 each 1  . mirabegron ER (MYRBETRIQ) 25 MG TB24 tablet Take 1 tablet (25 mg total) by mouth daily. 30 tablet 1  . ondansetron (ZOFRAN ODT) 4 MG disintegrating tablet Take 1 tablet (4 mg total) by mouth every 8 (eight) hours as needed for nausea. 10 tablet 0  . polyethylene glycol powder (GLYCOLAX/MIRALAX) 17 GM/SCOOP powder Take 17 g by mouth daily. 255 g 0  . senna (SENOKOT) 8.6 MG TABS tablet Take 1 tablet (8.6 mg total) by mouth daily. 120 tablet 0  . tamsulosin (FLOMAX) 0.4 MG CAPS capsule Take 0.4 mg by mouth daily.    Marland Kitchen VYZULTA 0.024 % SOLN Place 1 drop into the right eye daily.  0   No current facility-administered medications on file prior to visit.    Allergies  Allergen Reactions  . Levemir [Insulin Detemir] Itching    Family History  Problem Relation Age of Onset  . Colon cancer Mother   . Other Father        cerebral hemorrhage  . Diabetes Brother   . Diabetes Sister        x 2  . Bone cancer Daughter   . Breast cancer Daughter 65  . Esophageal cancer Neg Hx   . Rectal cancer Neg Hx   . Stomach cancer Neg Hx     BP (!) 160/90 (BP Location: Right Arm, Patient Position: Sitting, Cuff Size: Normal)   Pulse (!) 111   Ht 5\' 2"  (1.575 m)   Wt 140 lb 3.2 oz (63.6 kg)   SpO2 94%   BMI 25.64 kg/m    Review of Systems     Objective:   Physical Exam VITAL SIGNS:  See vs page GENERAL: no distress Pulses: dorsalis pedis are absent bilat (poss due to edema).   MSK: no deformity of the feet CV: 2+ leg edema Skin:  no ulcer on the feet.  normal color and temp on the feet.  Neuro: sensation is intact to touch on the feet.  Ext: there is bilateral onychomycosis of the toenails.     Lab Results  Component Value Date   HGBA1C 7.1 (A) 11/09/2020    Lab Results  Component Value Date   CREATININE 0.70 10/23/2020   BUN 13 10/23/2020   NA 135 10/23/2020   K 4.7 10/23/2020   CL 97 (L) 10/23/2020   CO2 27 10/23/2020   Lab Results  Component Value Date   TSH 5.38 (H) 11/09/2020        Assessment & Plan:  HTN: is noted today.  Type 2 DM: well-controlled.  She may be manageable off insulin.  I have sent a prescription to your pharmacy, to change to Rybelsus. Hypothyroidism: uncontrolled. I have sent a prescription to your pharmacy, to increase synthroid

## 2020-11-10 LAB — T4, FREE: Free T4: 0.97 ng/dL (ref 0.60–1.60)

## 2020-11-10 LAB — TSH: TSH: 5.38 u[IU]/mL — ABNORMAL HIGH (ref 0.35–4.50)

## 2020-11-10 MED ORDER — LEVOTHYROXINE SODIUM 50 MCG PO TABS: 50.0000 ug | ORAL_TABLET | Freq: Every day | ORAL | 3 refills | Status: DC

## 2020-11-11 ENCOUNTER — Ambulatory Visit: Payer: Medicare Other | Admitting: Podiatry

## 2020-11-18 ENCOUNTER — Telehealth: Payer: Self-pay | Admitting: Endocrinology

## 2020-11-18 NOTE — Telephone Encounter (Signed)
Ok, please resume the insulin, and continue the same Rybelsus.  I'll see you soon.

## 2020-11-18 NOTE — Telephone Encounter (Signed)
Ever since the change in her insulin, starting 3/26 pt's blood sugars have been running consistently over 300. Pt and daughter are really worried and is asking for the nurse to give her a call to see what could be going on. She has scheduled an appt to see Dr. Loanne Drilling for this Friday 4/1 and just wants to see if she should keep it and come in for Dr to assess?    Callback # [phone]

## 2020-11-18 NOTE — Telephone Encounter (Signed)
Daughter says mother is not drinking anything sweet, nor eating any deserts or having cold cereal and milk.  Says blood sugars started going high when Dr. Loanne Drilling stopped the insulin and started the Rybellus.   Reminded her of the appointment with dr. Loanne Drilling on Friday.

## 2020-11-19 ENCOUNTER — Ambulatory Visit (INDEPENDENT_AMBULATORY_CARE_PROVIDER_SITE_OTHER): Payer: Medicare Other | Admitting: Podiatry

## 2020-11-19 ENCOUNTER — Encounter: Payer: Self-pay | Admitting: Podiatry

## 2020-11-19 ENCOUNTER — Other Ambulatory Visit: Payer: Self-pay

## 2020-11-19 ENCOUNTER — Ambulatory Visit: Payer: Medicare Other | Admitting: Podiatry

## 2020-11-19 DIAGNOSIS — E1140 Type 2 diabetes mellitus with diabetic neuropathy, unspecified: Secondary | ICD-10-CM

## 2020-11-19 DIAGNOSIS — B351 Tinea unguium: Secondary | ICD-10-CM | POA: Diagnosis not present

## 2020-11-19 DIAGNOSIS — B353 Tinea pedis: Secondary | ICD-10-CM | POA: Diagnosis not present

## 2020-11-19 DIAGNOSIS — M79675 Pain in left toe(s): Secondary | ICD-10-CM

## 2020-11-19 DIAGNOSIS — M79674 Pain in right toe(s): Secondary | ICD-10-CM

## 2020-11-19 DIAGNOSIS — Z794 Long term (current) use of insulin: Secondary | ICD-10-CM

## 2020-11-19 MED ORDER — KETOCONAZOLE 2 % EX CREA: 1.0000 "application " | TOPICAL_CREAM | Freq: Every day | CUTANEOUS | 2 refills | Status: DC

## 2020-11-19 NOTE — Progress Notes (Signed)
  Subjective:  Patient ID: Hayley Fisher, female    DOB: 12/05/1930,  MRN: [id number]  Chief Complaint  Patient presents with  . Nail Problem    Nail trim     87 y.o. female presents with the above complaint. History confirmed with patient.  Here with her daughter, the nails become thickened elongated and painful  Objective:  Physical Exam: warm, good capillary refill, no trophic changes or ulcerative lesions, normal DP and PT pulses, abnormal sensory exam with loss of protective sensation.  She has thickened elongated nails with subungual debris and brown and yellow discoloration x10.  She has a dry scaling rash on the plantar foot and interdigitally this is pruritic. Assessment:   1. Type 2 diabetes mellitus with diabetic neuropathy, with long-term current use of insulin ([place])   2. Onychomycosis   3. Pain due to onychomycosis of toenails of both feet   4. Tinea pedis of both feet      Plan:  Patient was evaluated and treated and all questions answered.  Patient educated on diabetes. Discussed proper diabetic foot care and discussed risks and complications of disease. Educated patient in depth on reasons to return to the office immediately should he/she discover anything concerning or new on the feet. All questions answered. Discussed proper shoes as well.   Discussed the etiology and treatment options for the condition in detail with the patient. Educated patient on the topical and oral treatment options for mycotic nails. Recommended debridement of the nails today. Sharp and mechanical debridement performed of all painful and mycotic nails today. Nails debrided in length and thickness using a nail nipper to level of comfort. Discussed treatment options including appropriate shoe gear. Follow up as needed for painful nails.  Discussed the etiology and treatment options for tinea pedis.  Discussed topical and oral treatment.  Recommended topical treatment with 2% ketoconazole cream.   This was sent to the patient's pharmacy.  Also discussed appropriate foot hygiene, use of antifungal spray such as Tinactin in shoes, as well as cleaning her foot surfaces such as showers and bathroom floors with bleach.   Return in about 3 months (around 02/18/2021) for at risk diabetic foot care.

## 2020-11-20 ENCOUNTER — Ambulatory Visit (INDEPENDENT_AMBULATORY_CARE_PROVIDER_SITE_OTHER): Payer: Medicare Other | Admitting: Endocrinology

## 2020-11-20 ENCOUNTER — Ambulatory Visit: Payer: Medicare Other | Admitting: Endocrinology

## 2020-11-20 VITALS — BP 160/60 | HR 115 | Ht 61.0 in | Wt 131.8 lb

## 2020-11-20 DIAGNOSIS — E1140 Type 2 diabetes mellitus with diabetic neuropathy, unspecified: Secondary | ICD-10-CM | POA: Diagnosis not present

## 2020-11-20 DIAGNOSIS — Z794 Long term (current) use of insulin: Secondary | ICD-10-CM | POA: Diagnosis not present

## 2020-11-20 LAB — POCT GLYCOSYLATED HEMOGLOBIN (HGB A1C): Hemoglobin A1C: 7.9 % — AB (ref 4.0–5.6)

## 2020-11-20 MED ORDER — INSULIN GLARGINE 100 UNIT/ML ~~LOC~~ SOLN: 5.0000 [IU] | SUBCUTANEOUS | 11 refills | Status: DC

## 2020-11-20 NOTE — Patient Instructions (Addendum)
Your blood pressure is high today.  Please see your primary care provider soon, to have it rechecked check your blood sugar 4 times a day.  vary the time of day when you check, between before the 3 meals, and at bedtime.  also check if you have symptoms of your blood sugar being too high or too low.  please keep a record of the readings and bring it to your next appointment here (or you can bring the meter itself).  You can write it on any piece of paper.  please call us sooner if your blood sugar goes below 70, or if most of your readings are over 200.   Please resume the Lantus at 5 units each morning; and continue the same metformin and Rybelsus.    Please come back for a follow-up appointment in 1 month.

## 2020-11-20 NOTE — Progress Notes (Signed)
Subjective:    Patient ID: Hayley Fisher, female    DOB: 18-Oct-1930, 87 y.o.   MRN: [id number]  HPI Pt returns for f/u of diabetes mellitus:  DM type: Insulin-requiring type 2.   Dx'ed: 8850 Complications: DR and PN  Therapy: insulin since 2021, and metformin.   GDM: never DKA: never Severe hypoglycemia: never Pancreatitis: never Pancreatic imaging: normal on 2021 CT SDOH: Care assistant is with pt for appts.  However, pt gives her own insulin, and checks her own cbg.   Other: She prefers vial over pen Interval history: no cbg record, but states cbg's varies from 200-300.  It is in general higher as the day goes on.  She has not resumed the Lantus.    Pt also has chronic primary hypothyroidism (she has never had thyroid imaging).  She takes synthroid as rx'ed.  Past Medical History:  Diagnosis Date  . Arthritis   . Arthritis   . Cataract    bil cateracts removed  . Chronic kidney disease    "spot on one of my kidneys" per pt  . Diabetes mellitus   . Glaucoma   . Hyperkalemia 07/02/2020  . Hyperlipidemia   . Hyperplastic colon polyp   . Hypertension   . Metabolic acidosis 27/74/1287  . Syncope   . Thyroid disease     Past Surgical History:  Procedure Laterality Date  . ABDOMINAL HYSTERECTOMY    . BLADDER SUSPENSION    . bladder tacking    . COLONOSCOPY    . EYE SURGERY    . INGUINAL HERNIA REPAIR Right     Social History   Socioeconomic History  . Marital status: Single    Spouse name: Not on file  . Number of children: 4  . Years of education: Not on file  . Highest education level: Not on file  Occupational History  . Occupation: retired  Tobacco Use  . Smoking status: Former Smoker    Types: Cigarettes    Quit date: 11/29/1980    Years since quitting: 40.0  . Smokeless tobacco: Never Used  Vaping Use  . Vaping Use: Never used  Substance and Sexual Activity  . Alcohol use: No    Alcohol/week: 0.0 standard drinks  . Drug use: No  . Sexual  activity: Yes    Birth control/protection: None  Other Topics Concern  . Not on file  Social History Narrative  . Not on file   Social Determinants of Health   Financial Resource Strain: Not on file  Food Insecurity: Not on file  Transportation Needs: Not on file  Physical Activity: Not on file  Stress: Not on file  Social Connections: Not on file  Intimate Partner Violence: Not on file    Current Outpatient Medications on File Prior to Visit  Medication Sig Dispense Refill  . ACCU-CHEK AVIVA PLUS test strip USE TO TEST BLOOD SUGAR UP TO 3 TIMES A DAY. 300 strip 3  . Accu-Chek Softclix Lancets lancets USE TO TEST BLOOD SUGAR UP TO 3 TIMES A DAY. 300 each 3  . Alcohol Swabs PADS 1 Piece by Does not apply route daily. 120 each 2  . aspirin 81 MG chewable tablet Chew 81 mg by mouth every morning.    Marland Kitchen atorvastatin (LIPITOR) 40 MG tablet Take 1 tablet (40 mg total) by mouth daily. 90 tablet 3  . bisacodyl (DULCOLAX) 10 MG suppository Place 1 suppository (10 mg total) rectally as needed for moderate constipation. 12 suppository 0  .  brimonidine (ALPHAGAN P) 0.1 % SOLN Place 1 drop into the left eye 2 (two) times daily.     . carbamide peroxide (DEBROX) 6.5 % OTIC solution Place 5 drops into both ears 2 (two) times daily. (Patient taking differently: Place 2 drops into both ears 2 (two) times daily.) 15 mL 0  . Catheter Self-Adhesive Urinary MISC 1 patch by Does not apply route daily. 12 each 0  . dorzolamide-timolol (COSOPT) 22.3-6.8 MG/ML ophthalmic solution Place 1 drop into the left eye 2 (two) times daily.    . DULoxetine (CYMBALTA) 60 MG capsule TAKE 1 CAPSULE BY MOUTH EVERY DAY (Patient taking differently: Take 60 mg by mouth daily.) 90 capsule 0  . enalapril (VASOTEC) 20 MG tablet Take 1 tablet (20 mg total) by mouth daily. 90 tablet 3  . famotidine (PEPCID) 20 MG tablet Take 1 tablet (20 mg total) by mouth daily. If needed can take twice a day. 60 tablet 11  . Ferrous Bisglycinate  Chelate 28 MG CAPS Take 1 tablet by mouth daily. 90 capsule 11  . ferrous sulfate 324 MG TBEC Take 65 mg by mouth.    . fluconazole (DIFLUCAN) 150 MG tablet Take one tablet (150mg ) now, then take one tablet in 72 hours. 2 tablet 0  . ketoconazole (NIZORAL) 2 % cream Apply 1 application topically daily. 60 g 2  . levothyroxine (SYNTHROID) 50 MCG tablet Take 1 tablet (50 mcg total) by mouth daily. 90 tablet 3  . metFORMIN (GLUCOPHAGE-XR) 500 MG 24 hr tablet Take 2 tablets (1,000 mg total) by mouth every morning. 360 tablet 3  . mirabegron ER (MYRBETRIQ) 25 MG TB24 tablet Take 1 tablet (25 mg total) by mouth daily. 30 tablet 1  . ondansetron (ZOFRAN ODT) 4 MG disintegrating tablet Take 1 tablet (4 mg total) by mouth every 8 (eight) hours as needed for nausea. 10 tablet 0  . polyethylene glycol powder (GLYCOLAX/MIRALAX) 17 GM/SCOOP powder Take 17 g by mouth daily. 255 g 0  . Semaglutide (RYBELSUS) 3 MG TABS Take 3 mg by mouth daily. 30 tablet 11  . senna (SENOKOT) 8.6 MG TABS tablet Take 1 tablet (8.6 mg total) by mouth daily. 120 tablet 0  . SURE COMFORT INSULIN SYRINGE 31G X 5/16" 0.3 ML MISC use 1 syringe with insulin daily 100 each 1  . tamsulosin (FLOMAX) 0.4 MG CAPS capsule Take 0.4 mg by mouth daily.    Marland Kitchen VYZULTA 0.024 % SOLN Place 1 drop into the right eye daily.  0   No current facility-administered medications on file prior to visit.    Allergies  Allergen Reactions  . Levemir [Insulin Detemir] Itching    Family History  Problem Relation Age of Onset  . Colon cancer Mother   . Other Father        cerebral hemorrhage  . Diabetes Brother   . Diabetes Sister        x 2  . Bone cancer Daughter   . Breast cancer Daughter 73  . Esophageal cancer Neg Hx   . Rectal cancer Neg Hx   . Stomach cancer Neg Hx     BP (!) 160/60 (BP Location: Right Arm, Patient Position: Sitting, Cuff Size: Normal)   Pulse (!) 115   Ht 5\' 1"  (1.549 m)   Wt 131 lb 12.8 oz (59.8 kg)   SpO2 93%   BMI  24.90 kg/m    Review of Systems     Objective:   Physical Exam VITAL SIGNS:  See vs page GENERAL: no distress GAIT: steady, with a walker.     Lab Results  Component Value Date   HGBA1C 7.9 (A) 11/20/2020       Assessment & Plan:  HTN: is noted today Insulin-requiring type 2 DM: uncontrolled.  We may still be able to d/c insulin, buts she first needs to increase oral rx.    Patient Instructions  Your blood pressure is high today.  Please see your primary care provider soon, to have it rechecked check your blood sugar 4 times a day.  vary the time of day when you check, between before the 3 meals, and at bedtime.  also check if you have symptoms of your blood sugar being too high or too low.  please keep a record of the readings and bring it to your next appointment here (or you can bring the meter itself).  You can write it on any piece of paper.  please call us sooner if your blood sugar goes below 70, or if most of your readings are over 200.   Please resume the Lantus at 5 units each morning; and continue the same metformin and Rybelsus.    Please come back for a follow-up appointment in 1 month.

## 2020-11-20 NOTE — Telephone Encounter (Signed)
Pt have appt with Dr. Loanne Drilling today.

## 2020-11-24 ENCOUNTER — Telehealth: Payer: Self-pay | Admitting: Endocrinology

## 2020-11-24 NOTE — Telephone Encounter (Signed)
LVM @ [phone]. I do not know which family member called regarding pt but I did LVM at the # provided and advised them of what Ellison's recommendations were.

## 2020-11-24 NOTE — Telephone Encounter (Signed)
Family member calling to advise that patients fasting blood sugar is 489. She has taken AM medication with no reduction.  Patient is slow and sluggish at this time.  Call back number is [phone]

## 2020-11-24 NOTE — Telephone Encounter (Signed)
If AMS, go to ER.  If not, increase back to 10 units qd, starting today

## 2020-11-24 NOTE — Telephone Encounter (Signed)
Please Advise

## 2020-11-27 DIAGNOSIS — E113313 Type 2 diabetes mellitus with moderate nonproliferative diabetic retinopathy with macular edema, bilateral: Secondary | ICD-10-CM | POA: Diagnosis not present

## 2020-11-27 DIAGNOSIS — H401123 Primary open-angle glaucoma, left eye, severe stage: Secondary | ICD-10-CM | POA: Diagnosis not present

## 2020-11-27 DIAGNOSIS — E113311 Type 2 diabetes mellitus with moderate nonproliferative diabetic retinopathy with macular edema, right eye: Secondary | ICD-10-CM | POA: Diagnosis not present

## 2020-11-27 DIAGNOSIS — E113312 Type 2 diabetes mellitus with moderate nonproliferative diabetic retinopathy with macular edema, left eye: Secondary | ICD-10-CM | POA: Diagnosis not present

## 2020-11-30 NOTE — Telephone Encounter (Signed)
Spoke with daughter [name] and gave her Ellison's recommendations for her mom.

## 2020-11-30 NOTE — Telephone Encounter (Signed)
Daughter [name] called back regarding the message of pt's high BS. She states she didn't get a voicemail and that she would like a call ASAP with those instructions from the Dr at [phone].

## 2020-12-01 ENCOUNTER — Emergency Department (HOSPITAL_COMMUNITY): Payer: Medicare Other

## 2020-12-01 ENCOUNTER — Encounter (HOSPITAL_COMMUNITY): Payer: Self-pay

## 2020-12-01 ENCOUNTER — Emergency Department (HOSPITAL_COMMUNITY)
Admission: EM | Admit: 2020-12-01 | Discharge: 2020-12-01 | Disposition: A | Discharge status: Home / Self Care | Payer: Medicare Other | Attending: Emergency Medicine | Admitting: Emergency Medicine

## 2020-12-01 DIAGNOSIS — E1122 Type 2 diabetes mellitus with diabetic chronic kidney disease: Secondary | ICD-10-CM | POA: Diagnosis not present

## 2020-12-01 DIAGNOSIS — R739 Hyperglycemia, unspecified: Secondary | ICD-10-CM | POA: Diagnosis present

## 2020-12-01 DIAGNOSIS — Z87891 Personal history of nicotine dependence: Secondary | ICD-10-CM | POA: Diagnosis not present

## 2020-12-01 DIAGNOSIS — E1140 Type 2 diabetes mellitus with diabetic neuropathy, unspecified: Secondary | ICD-10-CM | POA: Diagnosis not present

## 2020-12-01 DIAGNOSIS — R350 Frequency of micturition: Secondary | ICD-10-CM | POA: Insufficient documentation

## 2020-12-01 DIAGNOSIS — Z7984 Long term (current) use of oral hypoglycemic drugs: Secondary | ICD-10-CM | POA: Insufficient documentation

## 2020-12-01 DIAGNOSIS — Z79899 Other long term (current) drug therapy: Secondary | ICD-10-CM | POA: Insufficient documentation

## 2020-12-01 DIAGNOSIS — E1165 Type 2 diabetes mellitus with hyperglycemia: Secondary | ICD-10-CM | POA: Diagnosis not present

## 2020-12-01 DIAGNOSIS — D72829 Elevated white blood cell count, unspecified: Secondary | ICD-10-CM | POA: Diagnosis not present

## 2020-12-01 DIAGNOSIS — I499 Cardiac arrhythmia, unspecified: Secondary | ICD-10-CM | POA: Diagnosis not present

## 2020-12-01 DIAGNOSIS — N189 Chronic kidney disease, unspecified: Secondary | ICD-10-CM | POA: Insufficient documentation

## 2020-12-01 DIAGNOSIS — R531 Weakness: Secondary | ICD-10-CM | POA: Insufficient documentation

## 2020-12-01 DIAGNOSIS — Z7982 Long term (current) use of aspirin: Secondary | ICD-10-CM | POA: Insufficient documentation

## 2020-12-01 DIAGNOSIS — R0602 Shortness of breath: Secondary | ICD-10-CM | POA: Diagnosis not present

## 2020-12-01 DIAGNOSIS — R0902 Hypoxemia: Secondary | ICD-10-CM | POA: Diagnosis not present

## 2020-12-01 DIAGNOSIS — J9811 Atelectasis: Secondary | ICD-10-CM | POA: Diagnosis not present

## 2020-12-01 DIAGNOSIS — I129 Hypertensive chronic kidney disease with stage 1 through stage 4 chronic kidney disease, or unspecified chronic kidney disease: Secondary | ICD-10-CM | POA: Insufficient documentation

## 2020-12-01 DIAGNOSIS — Z743 Need for continuous supervision: Secondary | ICD-10-CM | POA: Diagnosis not present

## 2020-12-01 DIAGNOSIS — Z794 Long term (current) use of insulin: Secondary | ICD-10-CM | POA: Diagnosis not present

## 2020-12-01 LAB — CBC WITH DIFFERENTIAL/PLATELET
Abs Immature Granulocytes: 0.03 10*3/uL (ref 0.00–0.07)
Basophils Absolute: 0 10*3/uL (ref 0.0–0.1)
Basophils Relative: 0 %
Eosinophils Absolute: 0 10*3/uL (ref 0.0–0.5)
Eosinophils Relative: 1 %
HCT: 31.2 % — ABNORMAL LOW (ref 36.0–46.0)
Hemoglobin: 10.2 g/dL — ABNORMAL LOW (ref 12.0–15.0)
Immature Granulocytes: 1 %
Lymphocytes Relative: 13 %
Lymphs Abs: 0.5 10*3/uL — ABNORMAL LOW (ref 0.7–4.0)
MCH: 30 pg (ref 26.0–34.0)
MCHC: 32.7 g/dL (ref 30.0–36.0)
MCV: 91.8 fL (ref 80.0–100.0)
Monocytes Absolute: 0.8 10*3/uL (ref 0.1–1.0)
Monocytes Relative: 19 %
Neutro Abs: 2.6 10*3/uL (ref 1.7–7.7)
Neutrophils Relative %: 66 %
Platelets: 318 10*3/uL (ref 150–400)
RBC: 3.4 MIL/uL — ABNORMAL LOW (ref 3.87–5.11)
RDW: 14.3 % (ref 11.5–15.5)
WBC: 4 10*3/uL (ref 4.0–10.5)
nRBC: 0 % (ref 0.0–0.2)

## 2020-12-01 LAB — URINALYSIS, ROUTINE W REFLEX MICROSCOPIC
Bacteria, UA: NONE SEEN
Bilirubin Urine: NEGATIVE
Glucose, UA: >=500 mg/dL — AB
Hgb urine dipstick: NEGATIVE
Ketones, ur: 5 mg/dL — AB
Nitrite: NEGATIVE
Protein, ur: 100 mg/dL — AB
Specific Gravity, Urine: 1.012 (ref 1.005–1.030)
WBC, UA: >50 WBC/hpf — ABNORMAL HIGH (ref 0–5)
pH: 5 (ref 5.0–8.0)

## 2020-12-01 LAB — COMPREHENSIVE METABOLIC PANEL
ALT: 16 U/L (ref 0–44)
AST: 21 U/L (ref 15–41)
Albumin: 3 g/dL — ABNORMAL LOW (ref 3.5–5.0)
Alkaline Phosphatase: 69 U/L (ref 38–126)
Anion gap: 14 (ref 5–15)
BUN: 18 mg/dL (ref 8–23)
CO2: 22 mmol/L (ref 22–32)
Calcium: 8.5 mg/dL — ABNORMAL LOW (ref 8.9–10.3)
Chloride: 97 mmol/L — ABNORMAL LOW (ref 98–111)
Creatinine, Ser: 0.91 mg/dL (ref 0.44–1.00)
GFR, Estimated: >60 mL/min (ref >60)
Glucose, Bld: 262 mg/dL — ABNORMAL HIGH (ref 70–99)
Potassium: 4.7 mmol/L (ref 3.5–5.1)
Sodium: 133 mmol/L — ABNORMAL LOW (ref 135–145)
Total Bilirubin: 0.5 mg/dL (ref 0.3–1.2)
Total Protein: 5.9 g/dL — ABNORMAL LOW (ref 6.5–8.1)

## 2020-12-01 MED ORDER — FLUCONAZOLE 150 MG PO TABS
150.0000 mg | ORAL_TABLET | Freq: Once | ORAL | Status: AC
  Administered 2020-12-01: 150 mg via ORAL
  Filled 2020-12-01: qty 1

## 2020-12-01 MED ORDER — LACTATED RINGERS IV BOLUS
1500.0000 mL | Freq: Once | INTRAVENOUS | Status: AC
  Administered 2020-12-01: 1500 mL via INTRAVENOUS

## 2020-12-01 NOTE — Discharge Instructions (Signed)
Please call your endocrinologist and let them know how your blood sugars have been trending.  See if they want to make any medication changes.

## 2020-12-01 NOTE — ED Provider Notes (Signed)
[facility] DEPT Provider Note   CSN: [id number] Arrival date & time: 12/01/20  1935     History Chief Complaint  Patient presents with  . Weakness  . Hyperglycemia  . Urinary Frequency    Hayley Fisher is a 87 y.o. female.  87 yo F with a chief complaints of hyperglycemia.  Going on for a few weeks.  Change medication but with persistent hyperglycemia.  Complaining of increased urinary frequency but denies any other specific complaint.  Denies chest pain abdominal pain cough or fever.   Weakness Associated symptoms: frequency   Associated symptoms: no arthralgias, no chest pain, no dizziness, no dysuria, no fever, no headaches, no myalgias, no nausea, no shortness of breath, no urgency and no vomiting   Hyperglycemia Associated symptoms: weakness   Associated symptoms: no chest pain, no dizziness, no dysuria, no fever, no nausea, no shortness of breath and no vomiting   Urinary Frequency Pertinent negatives include no chest pain, no headaches and no shortness of breath.       Past Medical History:  Diagnosis Date  . Arthritis   . Arthritis   . Cataract    bil cateracts removed  . Chronic kidney disease    "spot on one of my kidneys" per pt  . Diabetes mellitus   . Glaucoma   . Hyperkalemia 07/02/2020  . Hyperlipidemia   . Hyperplastic colon polyp   . Hypertension   . Metabolic acidosis 31/49/7026  . Syncope   . Thyroid disease     Patient Active Problem List   Diagnosis Date Noted  . Normocytic anemia 09/21/2020  . Gastroesophageal reflux disease without esophagitis 09/16/2020  . Urinary frequency 09/11/2020  . Stage 1 decubitus ulcer 07/30/2020  . UTI (urinary tract infection) 07/02/2020  . Skin ulcer of sacrum, limited to breakdown of skin ([place]) 03/16/2017  . Chronic venous insufficiency 11/09/2015  . DDD (degenerative disc disease), lumbosacral 11/24/2014  . Constipation by delayed colonic transit 11/24/2014  . Frequent  UTI 10/29/2012  . Type 2 diabetes mellitus with diabetic neuropathy ([place]) 10/19/2006  . Hyperlipidemia associated with type 2 diabetes mellitus ([place]) 10/19/2006  . Hypertension associated with diabetes ([place]) 10/19/2006  . Osteopenia 10/19/2006    Past Surgical History:  Procedure Laterality Date  . ABDOMINAL HYSTERECTOMY    . BLADDER SUSPENSION    . bladder tacking    . COLONOSCOPY    . EYE SURGERY    . INGUINAL HERNIA REPAIR Right      OB History   No obstetric history on file.     Family History  Problem Relation Age of Onset  . Colon cancer Mother   . Other Father        cerebral hemorrhage  . Diabetes Brother   . Diabetes Sister        x 2  . Bone cancer Daughter   . Breast cancer Daughter 60  . Esophageal cancer Neg Hx   . Rectal cancer Neg Hx   . Stomach cancer Neg Hx     Social History   Tobacco Use  . Smoking status: Former Smoker    Types: Cigarettes    Quit date: 11/29/1980    Years since quitting: 40.0  . Smokeless tobacco: Never Used  Vaping Use  . Vaping Use: Never used  Substance Use Topics  . Alcohol use: No    Alcohol/week: 0.0 standard drinks  . Drug use: No    Home Medications Prior to Admission medications  Medication Sig Start Date End Date Taking? Authorizing Provider  ACCU-CHEK AVIVA PLUS test strip USE TO TEST BLOOD SUGAR UP TO 3 TIMES A DAY. 06/30/20   Mullis, Kiersten P, DO  Accu-Chek Softclix Lancets lancets USE TO TEST BLOOD SUGAR UP TO 3 TIMES A DAY. 06/30/20   Mullis, Kiersten P, DO  Alcohol Swabs PADS 1 Piece by Does not apply route daily. 08/10/20   Mullis, Kiersten P, DO  aspirin 81 MG chewable tablet Chew 81 mg by mouth every morning.    [provider]  atorvastatin (LIPITOR) 40 MG tablet Take 1 tablet (40 mg total) by mouth daily. 09/21/20   Mullis, Kiersten P, DO  bisacodyl (DULCOLAX) 10 MG suppository Place 1 suppository (10 mg total) rectally as needed for moderate constipation. 10/23/20   Zigmund Gottron, NP   brimonidine (ALPHAGAN P) 0.1 % SOLN Place 1 drop into the left eye 2 (two) times daily.  09/20/11     carbamide peroxide (DEBROX) 6.5 % OTIC solution Place 5 drops into both ears 2 (two) times daily. Patient taking differently: Place 2 drops into both ears 2 (two) times daily. 10/16/18   Alveda Reasons, MD  Catheter Self-Adhesive Urinary MISC 1 patch by Does not apply route daily. 10/26/20   Anderson, Chelsey L, DO  dorzolamide-timolol (COSOPT) 22.3-6.8 MG/ML ophthalmic solution Place 1 drop into the left eye 2 (two) times daily. 05/18/20   [provider]  DULoxetine (CYMBALTA) 60 MG capsule TAKE 1 CAPSULE BY MOUTH EVERY DAY Patient taking differently: Take 60 mg by mouth daily. 10/22/18   Harriet Butte, DO  enalapril (VASOTEC) 20 MG tablet Take 1 tablet (20 mg total) by mouth daily. 09/21/20   Mullis, Kiersten P, DO  famotidine (PEPCID) 20 MG tablet Take 1 tablet (20 mg total) by mouth daily. If needed can take twice a day. 09/16/20   Mullis, Kiersten P, DO  Ferrous Bisglycinate Chelate 28 MG CAPS Take 1 tablet by mouth daily. 09/25/20   Mullis, Kiersten P, DO  ferrous sulfate 324 MG TBEC Take 65 mg by mouth.    [provider]  fluconazole (DIFLUCAN) 150 MG tablet Take one tablet (150mg ) now, then take one tablet in 72 hours. 09/18/20   Mullis, Kiersten P, DO  insulin glargine (LANTUS) 100 UNIT/ML injection Inject 0.05 mLs (5 Units total) into the skin every morning. 11/20/20   Renato Shin, MD  ketoconazole (NIZORAL) 2 % cream Apply 1 application topically daily. 11/19/20   Criselda Peaches, DPM  levothyroxine (SYNTHROID) 50 MCG tablet Take 1 tablet (50 mcg total) by mouth daily. 11/10/20   Renato Shin, MD  metFORMIN (GLUCOPHAGE-XR) 500 MG 24 hr tablet Take 2 tablets (1,000 mg total) by mouth every morning. 11/09/20   Renato Shin, MD  mirabegron ER (MYRBETRIQ) 25 MG TB24 tablet Take 1 tablet (25 mg total) by mouth daily. 09/16/20   Mullis, Kiersten P, DO  ondansetron (ZOFRAN ODT) 4  MG disintegrating tablet Take 1 tablet (4 mg total) by mouth every 8 (eight) hours as needed for nausea. 07/28/20   Larene Pickett, PA-C  polyethylene glycol powder (GLYCOLAX/MIRALAX) 17 GM/SCOOP powder Take 17 g by mouth daily. 10/23/20   Zigmund Gottron, NP  Semaglutide (RYBELSUS) 3 MG TABS Take 3 mg by mouth daily. 11/09/20   Renato Shin, MD  senna (SENOKOT) 8.6 MG TABS tablet Take 1 tablet (8.6 mg total) by mouth daily. 10/26/20   Anderson, Chelsey L, DO  SURE COMFORT INSULIN SYRINGE 31G X  5/16" 0.3 ML MISC use 1 syringe with insulin daily 11/12/20   Mullis, Kiersten P, DO  tamsulosin (FLOMAX) 0.4 MG CAPS capsule Take 0.4 mg by mouth daily. 10/08/20   [provider]  VYZULTA 0.024 % SOLN Place 1 drop into the right eye daily. 11/06/17   [provider]    Allergies    Levemir [insulin detemir]  Review of Systems   Review of Systems  Constitutional: Negative for chills and fever.  HENT: Negative for congestion and rhinorrhea.   Eyes: Negative for redness and visual disturbance.  Respiratory: Negative for shortness of breath and wheezing.   Cardiovascular: Negative for chest pain and palpitations.  Gastrointestinal: Negative for nausea and vomiting.  Genitourinary: Positive for frequency. Negative for dysuria and urgency.  Musculoskeletal: Negative for arthralgias and myalgias.  Skin: Negative for pallor and wound.  Neurological: Positive for weakness. Negative for dizziness and headaches.    Physical Exam Updated Vital Signs BP 120/69   Pulse 90   Resp 12   SpO2 99%   Physical Exam Vitals and nursing note reviewed.  Constitutional:      General: She is not in acute distress.    Appearance: She is well-developed. She is not diaphoretic.  HENT:     Head: Normocephalic and atraumatic.  Eyes:     Pupils: Pupils are equal, round, and reactive to light.  Cardiovascular:     Rate and Rhythm: Normal rate and regular rhythm.     Heart sounds: No murmur heard. No  friction rub. No gallop.   Pulmonary:     Effort: Pulmonary effort is normal.     Breath sounds: No wheezing or rales.  Abdominal:     General: There is no distension.     Palpations: Abdomen is soft.     Tenderness: There is no abdominal tenderness.  Musculoskeletal:        General: No tenderness.     Cervical back: Normal range of motion and neck supple.  Skin:    General: Skin is warm and dry.  Neurological:     Mental Status: She is alert and oriented to person, place, and time.  Psychiatric:        Behavior: Behavior normal.     ED Results / Procedures / Treatments   Labs (all labs ordered are listed, but only abnormal results are displayed) Labs Reviewed  CBC WITH DIFFERENTIAL/PLATELET - Abnormal; Notable for the following components:      Result Value   RBC 3.40 (*)    Hemoglobin 10.2 (*)    HCT 31.2 (*)    Lymphs Abs 0.5 (*)    All other components within normal limits  COMPREHENSIVE METABOLIC PANEL - Abnormal; Notable for the following components:   Sodium 133 (*)    Chloride 97 (*)    Glucose, Bld 262 (*)    Calcium 8.5 (*)    Total Protein 5.9 (*)    Albumin 3.0 (*)    All other components within normal limits  URINALYSIS, ROUTINE W REFLEX MICROSCOPIC - Abnormal; Notable for the following components:   APPearance TURBID (*)    Glucose, UA >=500 (*)    Ketones, ur 5 (*)    Protein, ur 100 (*)    Leukocytes,Ua LARGE (*)    WBC, UA >50 (*)    Non Squamous Epithelial 0-5 (*)    All other components within normal limits    EKG None  Radiology DG Chest [facility] 1 View  Result  Date: 12/01/2020 CLINICAL DATA:  Shortness of breath. EXAM: PORTABLE CHEST 1 VIEW COMPARISON:  July 02, 2020 FINDINGS: Persistently decreased lung volumes are seen. Very mild linear atelectasis noted within the bilateral lung bases, left greater than right. There is no evidence of a pleural effusion or pneumothorax. The heart size and mediastinal contours are within normal limits. The  visualized skeletal structures are unremarkable. IMPRESSION: Persistently decreased lung volumes with very mild bibasilar linear atelectasis. Electronically Signed   By: Virgina Norfolk M.D.   On: 12/01/2020 20:26    Procedures Procedures   Medications Ordered in ED Medications  fluconazole (DIFLUCAN) tablet 150 mg (has no administration in time range)  lactated ringers bolus 1,500 mL (1,500 mLs Intravenous New Bag/Given 12/01/20 2018)    ED Course  I have reviewed the triage vital signs and the nursing notes.  Pertinent labs & imaging results that were available during my care of the patient were reviewed by me and considered in my medical decision making (see chart for details).    MDM Rules/Calculators/A&P                          87 yo F with a cc of increased urinary frequency and hyperglycemia.  Going on for a few weeks now.  Is seen her family doctor and had had some medication changes.  Has had persistent hypoglycemia fatigue urinary frequency.  Will check blood work to evaluate for DKA.  Fluid bolus.  Reassess.  Lab work without acidosis to her anion gap.  Mild elevation of her blood sugar in the 200s.  Will discharge the patient home.  UA with large leukocyte esterase but no visible bacteria.  There were yeast seen.  Given Diflucan.  10:32 PM:  I have discussed the diagnosis/risks/treatment options with the patient and believe the pt to be eligible for discharge home to follow-up with PCP. We also discussed returning to the ED immediately if new or worsening sx occur. We discussed the sx which are most concerning (e.g., sudden worsening pain, fever, inability to tolerate by mouth) that necessitate immediate return. Medications administered to the patient during their visit and any new prescriptions provided to the patient are listed below.  Medications given during this visit Medications  fluconazole (DIFLUCAN) tablet 150 mg (has no administration in time range)  lactated  ringers bolus 1,500 mL (1,500 mLs Intravenous New Bag/Given 12/01/20 2018)     The patient appears reasonably screen and/or stabilized for discharge and I doubt any other medical condition or other [facility] requiring further screening, evaluation, or treatment in the ED at this time prior to discharge.   Final Clinical Impression(s) / ED Diagnoses Final diagnoses:  Hyperglycemia    Rx / DC Orders ED Discharge Orders    None       Deno Etienne, DO 12/01/20 2232

## 2020-12-01 NOTE — ED Triage Notes (Addendum)
Pt BIB EMS  Per EMS pt is being brought in for urinary frequency for a week, dark color, foul smelling. History of UTI pt does in and out cath herself at home, Hyperglycemia, 200-500 for a week now. Weakness for a week. Pt has a home health nurse that reported these symptoms to EMS. Her primary provider was treating her for high blood sugar rather than the urinary symptoms. Reported to be more weak than normal. Not eating very well, reports that she has been drinking fluids. Pt has a daughter that is in [employer] that has been contacted. (Daughters [name] and [employer]). Veronica (home health nurse) is supposed to pick up pt upon discharge.   Vitals  128/70 Hr 108 Temp 98.7 F  98% room air  cbg 282

## 2020-12-07 ENCOUNTER — Telehealth: Payer: Self-pay | Admitting: *Deleted

## 2020-12-07 ENCOUNTER — Telehealth: Payer: Self-pay | Admitting: Endocrinology

## 2020-12-07 NOTE — Telephone Encounter (Signed)
Patient's daughter returns call to nurse line to check status of previous message.   Talbot Grumbling, RN

## 2020-12-07 NOTE — Telephone Encounter (Signed)
Daughter Hayley Fisher calls and is requesting an abx for her mom's UTI.  Hayley Fisher reports that she was given one in the hospital but is still having cloudiness, dysuria and dark urine.  Advised I would send a message to PCP to see if appt needed to be made. Christen Bame, CMA

## 2020-12-07 NOTE — Telephone Encounter (Signed)
Pt daughter is calling regarding pt blood sugar being 302 this morning and blood sugar 312 last night. Pt's daughter would like a call back regarding on what to do.

## 2020-12-08 ENCOUNTER — Telehealth: Payer: Self-pay

## 2020-12-08 ENCOUNTER — Other Ambulatory Visit: Payer: Self-pay | Admitting: Family Medicine

## 2020-12-08 DIAGNOSIS — L299 Pruritus, unspecified: Secondary | ICD-10-CM

## 2020-12-08 DIAGNOSIS — R300 Dysuria: Secondary | ICD-10-CM

## 2020-12-08 MED ORDER — CEPHALEXIN 500 MG PO CAPS: 500.0000 mg | ORAL_CAPSULE | Freq: Four times a day (QID) | ORAL | 0 refills | Status: DC

## 2020-12-08 MED ORDER — FLUCONAZOLE 150 MG PO TABS: ORAL_TABLET | ORAL | 0 refills | Status: DC

## 2020-12-08 NOTE — Telephone Encounter (Signed)
Patient's daughter returns call to nurse line. Informed of below. Urine collection kit will be picked up from our office and returned when sample has been provided.   Talbot Grumbling, RN

## 2020-12-08 NOTE — Telephone Encounter (Signed)
Berle Mull pt's caregiver stopped by asking if a script could be called in for  Hemorrhoids. And wanted to inform that pt's urine maybe orange due to taking AZO pills. Call her daughter [name]

## 2020-12-08 NOTE — Telephone Encounter (Signed)
Please inform Hayley Fisher that it would be helpful to have a urine sample for antibiotic selection given her history of some resistance. Color of urine does not indicate infection, but the pain with urination does. She can either come in for a visit or drop of urine to be sampled. I will call in a antibiotic in the mean time until we get the culture results. She should report to the ED if she develops any fevers, change in mental status, or back pain. She should drink plenty of water.

## 2020-12-08 NOTE — Telephone Encounter (Signed)
LVM with daughter [name] with the new instructions for pt in reguards to starting glimepiride and increasing Rybelsus and ask that she give [place] a call if she has any questions/concerns.

## 2020-12-08 NOTE — Telephone Encounter (Signed)
I have called in an antibiotic to treat her empirically at this time. I have also called in a antifungal that she can take after she completes the antibiotics to treat any possible yeast infections since she has had those before. I have placed the future orders for the urine and urine culture. I will contact them with the results when I get them. I recommend that she follow up with the urologist to look to see if she is retaining urine again.

## 2020-12-09 ENCOUNTER — Other Ambulatory Visit: Payer: Self-pay

## 2020-12-09 ENCOUNTER — Encounter (HOSPITAL_COMMUNITY): Payer: Self-pay

## 2020-12-09 ENCOUNTER — Inpatient Hospital Stay (HOSPITAL_COMMUNITY)
Admission: EM | Admit: 2020-12-09 | Discharge: 2020-12-15 | DRG: 637 | Disposition: A | Discharge status: Skilled Nursing Facility | Payer: Medicare Other | Attending: Internal Medicine | Admitting: Internal Medicine

## 2020-12-09 DIAGNOSIS — Z87440 Personal history of urinary (tract) infections: Secondary | ICD-10-CM

## 2020-12-09 DIAGNOSIS — F05 Delirium due to known physiological condition: Secondary | ICD-10-CM | POA: Diagnosis not present

## 2020-12-09 DIAGNOSIS — E785 Hyperlipidemia, unspecified: Secondary | ICD-10-CM | POA: Diagnosis present

## 2020-12-09 DIAGNOSIS — Z833 Family history of diabetes mellitus: Secondary | ICD-10-CM | POA: Diagnosis not present

## 2020-12-09 DIAGNOSIS — E1159 Type 2 diabetes mellitus with other circulatory complications: Secondary | ICD-10-CM | POA: Diagnosis present

## 2020-12-09 DIAGNOSIS — Z888 Allergy status to other drugs, medicaments and biological substances status: Secondary | ICD-10-CM | POA: Diagnosis not present

## 2020-12-09 DIAGNOSIS — Z20822 Contact with and (suspected) exposure to covid-19: Secondary | ICD-10-CM | POA: Diagnosis not present

## 2020-12-09 DIAGNOSIS — R739 Hyperglycemia, unspecified: Secondary | ICD-10-CM | POA: Diagnosis not present

## 2020-12-09 DIAGNOSIS — R17 Unspecified jaundice: Secondary | ICD-10-CM | POA: Diagnosis present

## 2020-12-09 DIAGNOSIS — E1165 Type 2 diabetes mellitus with hyperglycemia: Principal | ICD-10-CM | POA: Diagnosis present

## 2020-12-09 DIAGNOSIS — Z87891 Personal history of nicotine dependence: Secondary | ICD-10-CM | POA: Diagnosis not present

## 2020-12-09 DIAGNOSIS — Z743 Need for continuous supervision: Secondary | ICD-10-CM | POA: Diagnosis not present

## 2020-12-09 DIAGNOSIS — I10 Essential (primary) hypertension: Secondary | ICD-10-CM | POA: Diagnosis not present

## 2020-12-09 DIAGNOSIS — G9341 Metabolic encephalopathy: Secondary | ICD-10-CM | POA: Diagnosis present

## 2020-12-09 DIAGNOSIS — D631 Anemia in chronic kidney disease: Secondary | ICD-10-CM | POA: Diagnosis present

## 2020-12-09 DIAGNOSIS — Y92009 Unspecified place in unspecified non-institutional (private) residence as the place of occurrence of the external cause: Secondary | ICD-10-CM

## 2020-12-09 DIAGNOSIS — Z803 Family history of malignant neoplasm of breast: Secondary | ICD-10-CM

## 2020-12-09 DIAGNOSIS — Z7982 Long term (current) use of aspirin: Secondary | ICD-10-CM

## 2020-12-09 DIAGNOSIS — Z90710 Acquired absence of both cervix and uterus: Secondary | ICD-10-CM

## 2020-12-09 DIAGNOSIS — E871 Hypo-osmolality and hyponatremia: Secondary | ICD-10-CM | POA: Diagnosis not present

## 2020-12-09 DIAGNOSIS — E1169 Type 2 diabetes mellitus with other specified complication: Secondary | ICD-10-CM | POA: Diagnosis present

## 2020-12-09 DIAGNOSIS — I152 Hypertension secondary to endocrine disorders: Secondary | ICD-10-CM | POA: Diagnosis not present

## 2020-12-09 DIAGNOSIS — N189 Chronic kidney disease, unspecified: Secondary | ICD-10-CM | POA: Diagnosis present

## 2020-12-09 DIAGNOSIS — Z8719 Personal history of other diseases of the digestive system: Secondary | ICD-10-CM

## 2020-12-09 DIAGNOSIS — N390 Urinary tract infection, site not specified: Secondary | ICD-10-CM | POA: Diagnosis present

## 2020-12-09 DIAGNOSIS — G9340 Encephalopathy, unspecified: Secondary | ICD-10-CM | POA: Diagnosis not present

## 2020-12-09 DIAGNOSIS — I499 Cardiac arrhythmia, unspecified: Secondary | ICD-10-CM | POA: Diagnosis not present

## 2020-12-09 DIAGNOSIS — E1122 Type 2 diabetes mellitus with diabetic chronic kidney disease: Secondary | ICD-10-CM | POA: Diagnosis not present

## 2020-12-09 DIAGNOSIS — E872 Acidosis: Secondary | ICD-10-CM | POA: Diagnosis not present

## 2020-12-09 DIAGNOSIS — W19XXXA Unspecified fall, initial encounter: Secondary | ICD-10-CM | POA: Diagnosis present

## 2020-12-09 DIAGNOSIS — R0902 Hypoxemia: Secondary | ICD-10-CM | POA: Diagnosis not present

## 2020-12-09 DIAGNOSIS — N3001 Acute cystitis with hematuria: Secondary | ICD-10-CM | POA: Diagnosis not present

## 2020-12-09 DIAGNOSIS — K5900 Constipation, unspecified: Secondary | ICD-10-CM | POA: Diagnosis present

## 2020-12-09 DIAGNOSIS — Z800 Family history of malignant neoplasm of digestive organs: Secondary | ICD-10-CM

## 2020-12-09 DIAGNOSIS — D649 Anemia, unspecified: Secondary | ICD-10-CM | POA: Diagnosis not present

## 2020-12-09 DIAGNOSIS — Z79899 Other long term (current) drug therapy: Secondary | ICD-10-CM

## 2020-12-09 DIAGNOSIS — Z7984 Long term (current) use of oral hypoglycemic drugs: Secondary | ICD-10-CM

## 2020-12-09 DIAGNOSIS — Z794 Long term (current) use of insulin: Secondary | ICD-10-CM

## 2020-12-09 DIAGNOSIS — K644 Residual hemorrhoidal skin tags: Secondary | ICD-10-CM | POA: Diagnosis present

## 2020-12-09 DIAGNOSIS — K649 Unspecified hemorrhoids: Secondary | ICD-10-CM | POA: Diagnosis not present

## 2020-12-09 DIAGNOSIS — F0390 Unspecified dementia without behavioral disturbance: Secondary | ICD-10-CM | POA: Diagnosis present

## 2020-12-09 DIAGNOSIS — Z79890 Hormone replacement therapy (postmenopausal): Secondary | ICD-10-CM

## 2020-12-09 DIAGNOSIS — E039 Hypothyroidism, unspecified: Secondary | ICD-10-CM | POA: Diagnosis present

## 2020-12-09 DIAGNOSIS — R531 Weakness: Secondary | ICD-10-CM | POA: Diagnosis not present

## 2020-12-09 HISTORY — DX: Hypothyroidism, unspecified: E03.9

## 2020-12-09 LAB — VITAMIN B12: Vitamin B-12: 609 pg/mL (ref 180–914)

## 2020-12-09 LAB — BASIC METABOLIC PANEL
Anion gap: 10 (ref 5–15)
Anion gap: 8 (ref 5–15)
BUN: 30 mg/dL — ABNORMAL HIGH (ref 8–23)
BUN: 25 mg/dL — ABNORMAL HIGH (ref 8–23)
CO2: 25 mmol/L (ref 22–32)
CO2: 27 mmol/L (ref 22–32)
Calcium: 8.8 mg/dL — ABNORMAL LOW (ref 8.9–10.3)
Calcium: 8.4 mg/dL — ABNORMAL LOW (ref 8.9–10.3)
Chloride: 94 mmol/L — ABNORMAL LOW (ref 98–111)
Chloride: 100 mmol/L (ref 98–111)
Creatinine, Ser: 0.85 mg/dL (ref 0.44–1.00)
Creatinine, Ser: 0.88 mg/dL (ref 0.44–1.00)
GFR, Estimated: >60 mL/min (ref >60)
GFR, Estimated: >60 mL/min (ref >60)
Glucose, Bld: 414 mg/dL — ABNORMAL HIGH (ref 70–99)
Glucose, Bld: 306 mg/dL — ABNORMAL HIGH (ref 70–99)
Potassium: 5.1 mmol/L (ref 3.5–5.1)
Potassium: 4.7 mmol/L (ref 3.5–5.1)
Sodium: 129 mmol/L — ABNORMAL LOW (ref 135–145)
Sodium: 135 mmol/L (ref 135–145)

## 2020-12-09 LAB — IRON AND TIBC
Iron: 82 ug/dL (ref 28–170)
Saturation Ratios: 32 % — ABNORMAL HIGH (ref 10.4–31.8)
TIBC: 260 ug/dL (ref 250–450)
UIBC: 178 ug/dL

## 2020-12-09 LAB — CBC
HCT: 26.1 % — ABNORMAL LOW (ref 36.0–46.0)
Hemoglobin: 8.3 g/dL — ABNORMAL LOW (ref 12.0–15.0)
MCH: 30.2 pg (ref 26.0–34.0)
MCHC: 31.8 g/dL (ref 30.0–36.0)
MCV: 94.9 fL (ref 80.0–100.0)
Platelets: 356 10*3/uL (ref 150–400)
RBC: 2.75 MIL/uL — ABNORMAL LOW (ref 3.87–5.11)
RDW: 14.9 % (ref 11.5–15.5)
WBC: 6.4 10*3/uL (ref 4.0–10.5)
nRBC: 0.3 % — ABNORMAL HIGH (ref 0.0–0.2)

## 2020-12-09 LAB — URINALYSIS, ROUTINE W REFLEX MICROSCOPIC
Bilirubin Urine: NEGATIVE
Glucose, UA: >=500 mg/dL — AB
Ketones, ur: NEGATIVE mg/dL
Nitrite: POSITIVE — AB
Protein, ur: 100 mg/dL — AB
Specific Gravity, Urine: 1.013 (ref 1.005–1.030)
WBC, UA: >50 WBC/hpf — ABNORMAL HIGH (ref 0–5)
pH: 5 (ref 5.0–8.0)

## 2020-12-09 LAB — RETICULOCYTES
Immature Retic Fract: 23.2 % — ABNORMAL HIGH (ref 2.3–15.9)
RBC.: 2.77 MIL/uL — ABNORMAL LOW (ref 3.87–5.11)
Retic Count, Absolute: 114.4 10*3/uL (ref 19.0–186.0)
Retic Ct Pct: 4.1 % — ABNORMAL HIGH (ref 0.4–3.1)

## 2020-12-09 LAB — LACTIC ACID, PLASMA: Lactic Acid, Venous: 1.3 mmol/L (ref 0.5–1.9)

## 2020-12-09 LAB — POC OCCULT BLOOD, ED: Fecal Occult Bld: NEGATIVE

## 2020-12-09 LAB — FERRITIN: Ferritin: 547 ng/mL — ABNORMAL HIGH (ref 11–307)

## 2020-12-09 LAB — HEPATIC FUNCTION PANEL
ALT: 15 U/L (ref 0–44)
AST: 19 U/L (ref 15–41)
Albumin: 3.3 g/dL — ABNORMAL LOW (ref 3.5–5.0)
Alkaline Phosphatase: 73 U/L (ref 38–126)
Bilirubin, Direct: 0.7 mg/dL — ABNORMAL HIGH (ref 0.0–0.2)
Indirect Bilirubin: 1.5 mg/dL — ABNORMAL HIGH (ref 0.3–0.9)
Total Bilirubin: 2.2 mg/dL — ABNORMAL HIGH (ref 0.3–1.2)
Total Protein: 6.5 g/dL (ref 6.5–8.1)

## 2020-12-09 LAB — CBG MONITORING, ED
Glucose-Capillary: 377 mg/dL — ABNORMAL HIGH (ref 70–99)
Glucose-Capillary: 342 mg/dL — ABNORMAL HIGH (ref 70–99)
Glucose-Capillary: 346 mg/dL — ABNORMAL HIGH (ref 70–99)

## 2020-12-09 LAB — GLUCOSE, CAPILLARY
Glucose-Capillary: 258 mg/dL — ABNORMAL HIGH (ref 70–99)
Glucose-Capillary: 296 mg/dL — ABNORMAL HIGH (ref 70–99)
Glucose-Capillary: 192 mg/dL — ABNORMAL HIGH (ref 70–99)

## 2020-12-09 LAB — RESP PANEL BY RT-PCR (FLU A&B, COVID) ARPGX2
Influenza A by PCR: NEGATIVE
Influenza B by PCR: NEGATIVE
SARS Coronavirus 2 by RT PCR: NEGATIVE

## 2020-12-09 LAB — LACTATE DEHYDROGENASE: LDH: 152 U/L (ref 98–192)

## 2020-12-09 LAB — FOLATE: Folate: 15.2 ng/mL (ref >5.9)

## 2020-12-09 MED ORDER — INSULIN ASPART 100 UNIT/ML ~~LOC~~ SOLN
7.0000 [IU] | Freq: Once | SUBCUTANEOUS | Status: AC
  Administered 2020-12-09: 7 [IU] via SUBCUTANEOUS

## 2020-12-09 MED ORDER — LACTATED RINGERS IV BOLUS
1000.0000 mL | Freq: Once | INTRAVENOUS | Status: AC
  Administered 2020-12-09: 1000 mL via INTRAVENOUS

## 2020-12-09 MED ORDER — ACETAMINOPHEN 650 MG RE SUPP: 650.0000 mg | Freq: Four times a day (QID) | RECTAL | Status: DC | PRN

## 2020-12-09 MED ORDER — INSULIN ASPART 100 UNIT/ML ~~LOC~~ SOLN
0.0000 [IU] | Freq: Every day | SUBCUTANEOUS | Status: DC
  Administered 2020-12-10: 4 [IU] via SUBCUTANEOUS
  Administered 2020-12-11: 2 [IU] via SUBCUTANEOUS
  Administered 2020-12-14: 3 [IU] via SUBCUTANEOUS
  Filled 2020-12-09: qty 0.05

## 2020-12-09 MED ORDER — HYDROCORTISONE (PERIANAL) 2.5 % EX CREA
TOPICAL_CREAM | Freq: Two times a day (BID) | CUTANEOUS | Status: DC
  Administered 2020-12-10 – 2020-12-14 (×2): 1 via RECTAL
  Filled 2020-12-09: qty 28.35

## 2020-12-09 MED ORDER — ACETAMINOPHEN 325 MG PO TABS
650.0000 mg | ORAL_TABLET | Freq: Four times a day (QID) | ORAL | Status: DC | PRN
  Administered 2020-12-09 – 2020-12-13 (×3): 650 mg via ORAL
  Filled 2020-12-09 (×4): qty 2

## 2020-12-09 MED ORDER — LEVOTHYROXINE SODIUM 50 MCG PO TABS
50.0000 ug | ORAL_TABLET | Freq: Every day | ORAL | Status: DC
  Administered 2020-12-09 – 2020-12-15 (×7): 50 ug via ORAL
  Filled 2020-12-09 (×7): qty 1

## 2020-12-09 MED ORDER — BISACODYL 5 MG PO TBEC: 5.0000 mg | DELAYED_RELEASE_TABLET | Freq: Every day | ORAL | Status: DC | PRN

## 2020-12-09 MED ORDER — INSULIN ASPART 100 UNIT/ML ~~LOC~~ SOLN
0.0000 [IU] | Freq: Three times a day (TID) | SUBCUTANEOUS | Status: DC
  Administered 2020-12-09: 7 [IU] via SUBCUTANEOUS
  Administered 2020-12-09 – 2020-12-10 (×3): 5 [IU] via SUBCUTANEOUS
  Administered 2020-12-10: 2 [IU] via SUBCUTANEOUS
  Administered 2020-12-10: 5 [IU] via SUBCUTANEOUS
  Administered 2020-12-11: 3 [IU] via SUBCUTANEOUS
  Administered 2020-12-11 (×2): 7 [IU] via SUBCUTANEOUS
  Filled 2020-12-09: qty 0.09

## 2020-12-09 MED ORDER — FAMOTIDINE 20 MG PO TABS
20.0000 mg | ORAL_TABLET | Freq: Every day | ORAL | Status: DC
  Administered 2020-12-09 – 2020-12-15 (×6): 20 mg via ORAL
  Filled 2020-12-09 (×7): qty 1

## 2020-12-09 MED ORDER — LACTATED RINGERS IV SOLN: INTRAVENOUS | Status: DC

## 2020-12-09 MED ORDER — METOPROLOL TARTRATE 5 MG/5ML IV SOLN
2.5000 mg | Freq: Once | INTRAVENOUS | Status: AC
  Administered 2020-12-09: 2.5 mg via INTRAVENOUS
  Filled 2020-12-09: qty 5

## 2020-12-09 MED ORDER — ONDANSETRON HCL 4 MG/2ML IJ SOLN: 4.0000 mg | Freq: Four times a day (QID) | INTRAMUSCULAR | Status: DC | PRN

## 2020-12-09 MED ORDER — SODIUM CHLORIDE 0.9 % IV SOLN
1.0000 g | INTRAVENOUS | Status: DC
  Administered 2020-12-09 – 2020-12-14 (×6): 1 g via INTRAVENOUS
  Filled 2020-12-09: qty 10
  Filled 2020-12-09 (×2): qty 1
  Filled 2020-12-09 (×2): qty 10
  Filled 2020-12-09: qty 1

## 2020-12-09 MED ORDER — INSULIN GLARGINE 100 UNIT/ML ~~LOC~~ SOLN
5.0000 [IU] | Freq: Every day | SUBCUTANEOUS | Status: DC
  Administered 2020-12-09 – 2020-12-10 (×2): 5 [IU] via SUBCUTANEOUS
  Filled 2020-12-09 (×4): qty 0.05

## 2020-12-09 MED ORDER — POLYETHYLENE GLYCOL 3350 17 G PO PACK: 17.0000 g | PACK | Freq: Every day | ORAL | Status: DC | PRN

## 2020-12-09 MED ORDER — TAMSULOSIN HCL 0.4 MG PO CAPS
0.4000 mg | ORAL_CAPSULE | Freq: Every day | ORAL | Status: DC
  Administered 2020-12-09 – 2020-12-15 (×7): 0.4 mg via ORAL
  Filled 2020-12-09 (×7): qty 1

## 2020-12-09 MED ORDER — WITCH HAZEL-GLYCERIN EX PADS
MEDICATED_PAD | CUTANEOUS | Status: DC | PRN
  Administered 2020-12-10 (×2): 1 via TOPICAL
  Filled 2020-12-09: qty 100

## 2020-12-09 MED ORDER — ONDANSETRON HCL 4 MG PO TABS
4.0000 mg | ORAL_TABLET | Freq: Four times a day (QID) | ORAL | Status: DC | PRN
Start: 2020-12-09 — End: 2020-12-15

## 2020-12-09 NOTE — ED Notes (Signed)
Pt departed the dept to the floor via stretcher and transport by ED tech.

## 2020-12-09 NOTE — H&P (Signed)
History and Physical    Hayley Fisher GOT:[id number] DOB: 05-19-1931 DOA: 12/09/2020  PCP: Danna Hefty, DO   Patient coming from: Home   Chief Complaint: Increased confusion and fatigue, elevated CBGs, UTI   HPI: Hayley Fisher is a 87 y.o. female with medical history significant for insulin-dependent diabetes mellitus, hypothyroidism, hypertension, anemia, presenting to emergency department for evaluation of fatigue, increased confusion, general weakness, and elevated blood sugars.  Patient is accompanied by a daughter who assists with the history.  Patient was diagnosed with UTI approximately 1 week ago but had trouble getting the antibiotic and is only taken 1 dose.  She has been increasingly confused and fatigued, family reports that her glucose has been running high.  Patient denies any chest pain, shortness of breath, cough, abdominal pain, vomiting, or diarrhea.  EMS was called out and reports CBG of 535.  ED Course: Upon arrival to the ED, patient is found to be afebrile, saturating mid to upper 90s on room air, tachycardic, and with stable blood pressure.  Chemistry panel notable for glucose of 414 and total bilirubin 2.2.  CBC features a hemoglobin of 8.3, down from 10.2 a week ago.  Fecal occult blood testing is negative.  Patient was given a liter of LR.  Urinalysis and COVID-19 screening test are pending.  Review of Systems:  All other systems reviewed and apart from HPI, are negative.  Past Medical History:  Diagnosis Date  . Arthritis   . Arthritis   . Cataract    bil cateracts removed  . Chronic kidney disease    "spot on one of my kidneys" per pt  . Diabetes mellitus   . Glaucoma   . Hyperkalemia 07/02/2020  . Hyperlipidemia   . Hyperplastic colon polyp   . Hypertension   . Hypothyroidism 12/09/2020  . Metabolic acidosis 59/74/1638  . Syncope   . Thyroid disease     Past Surgical History:  Procedure Laterality Date  . ABDOMINAL HYSTERECTOMY    . BLADDER  SUSPENSION    . bladder tacking    . COLONOSCOPY    . EYE SURGERY    . INGUINAL HERNIA REPAIR Right     Social History:   reports that she quit smoking about 40 years ago. Her smoking use included cigarettes. She has never used smokeless tobacco. She reports that she does not drink alcohol and does not use drugs.  Allergies  Allergen Reactions  . Levemir [Insulin Detemir] Itching    Family History  Problem Relation Age of Onset  . Colon cancer Mother   . Other Father        cerebral hemorrhage  . Diabetes Brother   . Diabetes Sister        x 2  . Bone cancer Daughter   . Breast cancer Daughter 43  . Esophageal cancer Neg Hx   . Rectal cancer Neg Hx   . Stomach cancer Neg Hx      Prior to Admission medications   Medication Sig Start Date End Date Taking? Authorizing Provider  ACCU-CHEK AVIVA PLUS test strip USE TO TEST BLOOD SUGAR UP TO 3 TIMES A DAY. 06/30/20   Mullis, Kiersten P, DO  Accu-Chek Softclix Lancets lancets USE TO TEST BLOOD SUGAR UP TO 3 TIMES A DAY. 06/30/20   Mullis, Kiersten P, DO  Alcohol Swabs PADS 1 Piece by Does not apply route daily. 08/10/20   Mullis, Kiersten P, DO  aspirin 81 MG chewable tablet Chew 81 mg by mouth  every morning.    [provider]  atorvastatin (LIPITOR) 40 MG tablet Take 1 tablet (40 mg total) by mouth daily. 09/21/20   Mullis, Kiersten P, DO  bisacodyl (DULCOLAX) 10 MG suppository Place 1 suppository (10 mg total) rectally as needed for moderate constipation. 10/23/20   Zigmund Gottron, NP  brimonidine (ALPHAGAN P) 0.1 % SOLN Place 1 drop into the left eye 2 (two) times daily.  09/20/11     carbamide peroxide (DEBROX) 6.5 % OTIC solution Place 5 drops into both ears 2 (two) times daily. Patient taking differently: Place 2 drops into both ears 2 (two) times daily. 10/16/18   Alveda Reasons, MD  Catheter Self-Adhesive Urinary MISC 1 patch by Does not apply route daily. 10/26/20   Anderson, Chelsey L, DO  cephALEXin (KEFLEX) 500  MG capsule Take 1 capsule (500 mg total) by mouth 4 (four) times daily for 5 days. 12/08/20 12/13/20  Mullis, Kiersten P, DO  dorzolamide-timolol (COSOPT) 22.3-6.8 MG/ML ophthalmic solution Place 1 drop into the left eye 2 (two) times daily. 05/18/20   [provider]  DULoxetine (CYMBALTA) 60 MG capsule TAKE 1 CAPSULE BY MOUTH EVERY DAY Patient taking differently: Take 60 mg by mouth daily. 10/22/18   Harriet Butte, DO  enalapril (VASOTEC) 20 MG tablet Take 1 tablet (20 mg total) by mouth daily. 09/21/20   Mullis, Kiersten P, DO  famotidine (PEPCID) 20 MG tablet Take 1 tablet (20 mg total) by mouth daily. If needed can take twice a day. 09/16/20   Mullis, Kiersten P, DO  Ferrous Bisglycinate Chelate 28 MG CAPS Take 1 tablet by mouth daily. 09/25/20   Mullis, Kiersten P, DO  ferrous sulfate 324 MG TBEC Take 65 mg by mouth.    [provider]  fluconazole (DIFLUCAN) 150 MG tablet Take one tablet (151m) now, then take one tablet in 72 hours. Take after completion of antibiotics. 12/08/20   Mullis, Kiersten P, DO  insulin glargine (LANTUS) 100 UNIT/ML injection Inject 0.05 mLs (5 Units total) into the skin every morning. 11/20/20   ERenato Shin MD  ketoconazole (NIZORAL) 2 % cream Apply 1 application topically daily. 11/19/20   MCriselda Peaches DPM  levothyroxine (SYNTHROID) 50 MCG tablet Take 1 tablet (50 mcg total) by mouth daily. 11/10/20   ERenato Shin MD  metFORMIN (GLUCOPHAGE-XR) 500 MG 24 hr tablet Take 2 tablets (1,000 mg total) by mouth every morning. 11/09/20   ERenato Shin MD  mirabegron ER (MYRBETRIQ) 25 MG TB24 tablet Take 1 tablet (25 mg total) by mouth daily. 09/16/20   Mullis, Kiersten P, DO  ondansetron (ZOFRAN ODT) 4 MG disintegrating tablet Take 1 tablet (4 mg total) by mouth every 8 (eight) hours as needed for nausea. 07/28/20   SLarene Pickett PA-C  polyethylene glycol powder (GLYCOLAX/MIRALAX) 17 GM/SCOOP powder Take 17 g by mouth daily. 10/23/20   BZigmund Gottron NP   Semaglutide (RYBELSUS) 3 MG TABS Take 3 mg by mouth daily. 11/09/20   ERenato Shin MD  senna (SENOKOT) 8.6 MG TABS tablet Take 1 tablet (8.6 mg total) by mouth daily. 10/26/20   ADoristine MangoL, DO  SURE COMFORT INSULIN SYRINGE 31G X 5/16" 0.3 ML MISC use 1 syringe with insulin daily 11/12/20   Mullis, Kiersten P, DO  tamsulosin (FLOMAX) 0.4 MG CAPS capsule Take 0.4 mg by mouth daily. 10/08/20   [provider]  VYZULTA 0.024 % SOLN Place 1 drop into the right eye daily. 11/06/17   [provider]    Physical Exam: Vitals:   12/09/20 0353 12/09/20 0400 12/09/20 0430 12/09/20 0500  BP:  119/69 (!) 143/70 127/66  Pulse: (!) 114 (!) 115 (!) 116 (!) 110  Resp: 20 13 (!) 25 14  Temp:      TempSrc:      SpO2: 96% 97% 96% 98%    Constitutional: NAD, calm  Eyes: PERTLA, lids and conjunctivae normal ENMT: Mucous membranes are moist. Posterior pharynx clear of any exudate or lesions.   Neck:  supple, no masses  Respiratory:  no wheezing, no crackles. No accessory muscle use.  Cardiovascular: Rate ~100 and regular. Pretibial pitting edema.   Abdomen: No distension, no tenderness, soft. Bowel sounds active.  Musculoskeletal: no clubbing / cyanosis. No joint deformity upper and lower extremities.   Skin: no significant rashes, lesions, ulcers. Warm, dry, well-perfused. Neurologic: CN 2-12 grossly intact. Sensation intact. Moving all extremities.  Psychiatric: Alert and oriented to person, place, and situation. Calm and cooperative.    Labs and Imaging on Admission: I have personally reviewed following labs and imaging studies  CBC: Recent Labs  Lab 12/09/20 0315  WBC 6.4  HGB 8.3*  HCT 26.1*  MCV 94.9  PLT 212   Basic Metabolic Panel: Recent Labs  Lab 12/09/20 0315  NA 129*  K 5.1  CL 94*  CO2 25  GLUCOSE 414*  BUN 30*  CREATININE 0.85  CALCIUM 8.8*   GFR: CrCl cannot be calculated (Unknown ideal weight.). Liver Function Tests: Recent Labs  Lab  12/09/20 0315  AST 19  ALT 15  ALKPHOS 73  BILITOT 2.2*  PROT 6.5  ALBUMIN 3.3*   No results for input(s): LIPASE, AMYLASE in the last 168 hours. No results for input(s): AMMONIA in the last 168 hours. Coagulation Profile: No results for input(s): INR, PROTIME in the last 168 hours. Cardiac Enzymes: No results for input(s): CKTOTAL, CKMB, CKMBINDEX, TROPONINI in the last 168 hours. BNP (last 3 results) No results for input(s): PROBNP in the last 8760 hours. HbA1C: No results for input(s): HGBA1C in the last 72 hours. CBG: Recent Labs  Lab 12/09/20 0241 12/09/20 0601  GLUCAP 377* 342*   Lipid Profile: No results for input(s): CHOL, HDL, LDLCALC, TRIG, CHOLHDL, LDLDIRECT in the last 72 hours. Thyroid Function Tests: No results for input(s): TSH, T4TOTAL, FREET4, T3FREE, THYROIDAB in the last 72 hours. Anemia Panel: Recent Labs    12/09/20 0315  RETICCTPCT 4.1*   Urine analysis:    Component Value Date/Time   COLORURINE YELLOW 12/01/2020 2100   APPEARANCEUR TURBID (A) 12/01/2020 2100   LABSPEC 1.012 12/01/2020 2100   PHURINE 5.0 12/01/2020 2100   GLUCOSEU >=500 (A) 12/01/2020 2100   HGBUR NEGATIVE 12/01/2020 2100   HGBUR trace-intact 07/20/2010 1433   BILIRUBINUR NEGATIVE 12/01/2020 2100   BILIRUBINUR negative 09/09/2020 1604   BILIRUBINUR NEG 08/09/2016 1500   KETONESUR 5 (A) 12/01/2020 2100   PROTEINUR 100 (A) 12/01/2020 2100   UROBILINOGEN 1.0 10/23/2020 1420   NITRITE NEGATIVE 12/01/2020 2100   LEUKOCYTESUR LARGE (A) 12/01/2020 2100   Sepsis Labs: '@LABRCNTIP' (procalcitonin:4,lacticidven:4) )No results found for this or any previous visit (from the past 240 hour(s)).   Radiological Exams on Admission: No results found.  EKG: Independently reviewed. Sinus tachycardia, rate 107.   Assessment/Plan   1. Acute encephalopathy  - Presents with increased confusion and fatigue, attributed by her family to recurrent UTI  - Anticipate improvement with treatment  of UTI, glycemic-control, and IVF hydration  -  Culture urine and start Rocephin, control sugars, continue IVF hydration, continue supportive care    2. UTI  - Diagnosed with UTI 1 wk ago but was delayed in picking up antibiotic and has only taken one dose, now presenting with increased confusion and fatigue  - Not septic on admission  - Culture urine, start Rocephin for now    3. Anemia - Hgb is 8.3 on admission, down from 10.2 one week ago  - FOBT is negative; bilirubin is 2.2, previously normal  - Check anemia panel, LDH, and haptoglobin, repeat CBC tomorrow    4. Hyperbilirubinemia  - Conjugated bilirubin is 0.7 and unconjugated 1.5 with normal alk phos and transaminases  - Abdominal exam benign, Hgb down 2 g in one wk with negative FOBT  - Check LDH and haptoglobin, treat UTI, continue IVF hydration, repeat CMP tomorrow    5. Insulin-dependent DM  - Serum glucose 414 in ED without DKA  - A1c was 7.9% three wks ago - Continue CBG checks, insulin    6. Hypothyroidism  - TSH was 5.38 and free T4 0.97 one month ago  - Continue Synthroid    DVT prophylaxis: SCDs  Code Status: Full  Level of Care: Level of care: Med-Surg Family Communication: Daughter updated at bedside Disposition Plan:  Patient is from: Home  Anticipated d/c is to: TBD Anticipated d/c date is: 12/10/20 Patient currently: Pending glycemic-control, repeat CBC, UA/culture  Consults called: None  Admission status: Observation     Vianne Bulls, MD [facility]  12/09/2020, 6:25 AM

## 2020-12-09 NOTE — ED Notes (Signed)
Pt unable to sign waiver for triage for mse, pt confused, no family or visitor at bedside with pt.

## 2020-12-09 NOTE — ED Notes (Signed)
Pt had a large urine output, pt was change and reposition.

## 2020-12-09 NOTE — ED Notes (Signed)
Pt awake and alert. Attached to the cardiac monitor x3. Pt's HR is tachy at 111. VS are otherwise stable.

## 2020-12-09 NOTE — ED Notes (Signed)
Report received from Caroline, RN.

## 2020-12-09 NOTE — ED Provider Notes (Signed)
[place] DEPT Provider Note   CSN: [id number] Arrival date & time: 12/09/20  0230   History Chief Complaint  Patient presents with  . Hyperglycemia    Hayley Fisher is a 87 y.o. female.  The history is provided by a relative. The history is limited by the condition of the patient (Confusion).  Hyperglycemia She has history of hypertension, diabetes, hyperlipidemia and was brought in because of a fall at home tonight and elevated glucose.  Glucose was 522.  Daughter has also noted increasing confusion.  She was diagnosed with a UTI and started on cephalexin tonight.  She is only received 1 dose of cephalexin.  She has not had any known fever.  She frequently gets confused when she has a UTI.  She does have some underlying dementia.  Her only other complaint is a painful hemorrhoid.  EMS reports blood glucose of 535.  Past Medical History:  Diagnosis Date  . Arthritis   . Arthritis   . Cataract    bil cateracts removed  . Chronic kidney disease    "spot on one of my kidneys" per pt  . Diabetes mellitus   . Glaucoma   . Hyperkalemia 07/02/2020  . Hyperlipidemia   . Hyperplastic colon polyp   . Hypertension   . Metabolic acidosis 57/84/6962  . Syncope   . Thyroid disease     Patient Active Problem List   Diagnosis Date Noted  . Normocytic anemia 09/21/2020  . Gastroesophageal reflux disease without esophagitis 09/16/2020  . Urinary frequency 09/11/2020  . Stage 1 decubitus ulcer 07/30/2020  . UTI (urinary tract infection) 07/02/2020  . Skin ulcer of sacrum, limited to breakdown of skin ([place]) 03/16/2017  . Chronic venous insufficiency 11/09/2015  . DDD (degenerative disc disease), lumbosacral 11/24/2014  . Constipation by delayed colonic transit 11/24/2014  . Frequent UTI 10/29/2012  . Type 2 diabetes mellitus with diabetic neuropathy ([place]) 10/19/2006  . Hyperlipidemia associated with type 2 diabetes mellitus ([place]) 10/19/2006  .  Hypertension associated with diabetes ([place]) 10/19/2006  . Osteopenia 10/19/2006    Past Surgical History:  Procedure Laterality Date  . ABDOMINAL HYSTERECTOMY    . BLADDER SUSPENSION    . bladder tacking    . COLONOSCOPY    . EYE SURGERY    . INGUINAL HERNIA REPAIR Right      OB History   No obstetric history on file.     Family History  Problem Relation Age of Onset  . Colon cancer Mother   . Other Father        cerebral hemorrhage  . Diabetes Brother   . Diabetes Sister        x 2  . Bone cancer Daughter   . Breast cancer Daughter 32  . Esophageal cancer Neg Hx   . Rectal cancer Neg Hx   . Stomach cancer Neg Hx     Social History   Tobacco Use  . Smoking status: Former Smoker    Types: Cigarettes    Quit date: 11/29/1980    Years since quitting: 40.0  . Smokeless tobacco: Never Used  Vaping Use  . Vaping Use: Never used  Substance Use Topics  . Alcohol use: No    Alcohol/week: 0.0 standard drinks  . Drug use: No    Home Medications Prior to Admission medications   Medication Sig Start Date End Date Taking? Authorizing Provider  ACCU-CHEK AVIVA PLUS test strip USE TO TEST BLOOD SUGAR UP TO 3 TIMES  A DAY. 06/30/20   Mullis, Kiersten P, DO  Accu-Chek Softclix Lancets lancets USE TO TEST BLOOD SUGAR UP TO 3 TIMES A DAY. 06/30/20   Mullis, Kiersten P, DO  Alcohol Swabs PADS 1 Piece by Does not apply route daily. 08/10/20   Mullis, Kiersten P, DO  aspirin 81 MG chewable tablet Chew 81 mg by mouth every morning.    [provider]  atorvastatin (LIPITOR) 40 MG tablet Take 1 tablet (40 mg total) by mouth daily. 09/21/20   Mullis, Kiersten P, DO  bisacodyl (DULCOLAX) 10 MG suppository Place 1 suppository (10 mg total) rectally as needed for moderate constipation. 10/23/20   Zigmund Gottron, NP  brimonidine (ALPHAGAN P) 0.1 % SOLN Place 1 drop into the left eye 2 (two) times daily.  09/20/11     carbamide peroxide (DEBROX) 6.5 % OTIC solution Place 5 drops into  both ears 2 (two) times daily. Patient taking differently: Place 2 drops into both ears 2 (two) times daily. 10/16/18   Alveda Reasons, MD  Catheter Self-Adhesive Urinary MISC 1 patch by Does not apply route daily. 10/26/20   Anderson, Chelsey L, DO  cephALEXin (KEFLEX) 500 MG capsule Take 1 capsule (500 mg total) by mouth 4 (four) times daily for 5 days. 12/08/20 12/13/20  Mullis, Kiersten P, DO  dorzolamide-timolol (COSOPT) 22.3-6.8 MG/ML ophthalmic solution Place 1 drop into the left eye 2 (two) times daily. 05/18/20   [provider]  DULoxetine (CYMBALTA) 60 MG capsule TAKE 1 CAPSULE BY MOUTH EVERY DAY Patient taking differently: Take 60 mg by mouth daily. 10/22/18   Harriet Butte, DO  enalapril (VASOTEC) 20 MG tablet Take 1 tablet (20 mg total) by mouth daily. 09/21/20   Mullis, Kiersten P, DO  famotidine (PEPCID) 20 MG tablet Take 1 tablet (20 mg total) by mouth daily. If needed can take twice a day. 09/16/20   Mullis, Kiersten P, DO  Ferrous Bisglycinate Chelate 28 MG CAPS Take 1 tablet by mouth daily. 09/25/20   Mullis, Kiersten P, DO  ferrous sulfate 324 MG TBEC Take 65 mg by mouth.    [provider]  fluconazole (DIFLUCAN) 150 MG tablet Take one tablet (150mg ) now, then take one tablet in 72 hours. Take after completion of antibiotics. 12/08/20   Mullis, Kiersten P, DO  insulin glargine (LANTUS) 100 UNIT/ML injection Inject 0.05 mLs (5 Units total) into the skin every morning. 11/20/20   Renato Shin, MD  ketoconazole (NIZORAL) 2 % cream Apply 1 application topically daily. 11/19/20   Criselda Peaches, DPM  levothyroxine (SYNTHROID) 50 MCG tablet Take 1 tablet (50 mcg total) by mouth daily. 11/10/20   Renato Shin, MD  metFORMIN (GLUCOPHAGE-XR) 500 MG 24 hr tablet Take 2 tablets (1,000 mg total) by mouth every morning. 11/09/20   Renato Shin, MD  mirabegron ER (MYRBETRIQ) 25 MG TB24 tablet Take 1 tablet (25 mg total) by mouth daily. 09/16/20   Mullis, Kiersten P, DO  ondansetron  (ZOFRAN ODT) 4 MG disintegrating tablet Take 1 tablet (4 mg total) by mouth every 8 (eight) hours as needed for nausea. 07/28/20   Larene Pickett, PA-C  polyethylene glycol powder (GLYCOLAX/MIRALAX) 17 GM/SCOOP powder Take 17 g by mouth daily. 10/23/20   Zigmund Gottron, NP  Semaglutide (RYBELSUS) 3 MG TABS Take 3 mg by mouth daily. 11/09/20   Renato Shin, MD  senna (SENOKOT) 8.6 MG TABS tablet Take 1 tablet (8.6 mg total) by mouth daily. 10/26/20   Doristine Mango  L, DO  SURE COMFORT INSULIN SYRINGE 31G X 5/16" 0.3 ML MISC use 1 syringe with insulin daily 11/12/20   Mullis, Kiersten P, DO  tamsulosin (FLOMAX) 0.4 MG CAPS capsule Take 0.4 mg by mouth daily. 10/08/20   [provider]  VYZULTA 0.024 % SOLN Place 1 drop into the right eye daily. 11/06/17   [provider]    Allergies    Levemir [insulin detemir]  Review of Systems   Review of Systems  Unable to perform ROS: Mental status change    Physical Exam Updated Vital Signs BP (!) 143/70   Pulse (!) 116   Temp 98.1 F (36.7 C) (Oral)   Resp (!) 25   SpO2 96%   Physical Exam Vitals and nursing note reviewed.   87 year old female, resting comfortably and in no acute distress. Vital signs are significant for elevated heart rate, elevated respiratory rate, borderline elevated blood pressure. Oxygen saturation is 96%, which is normal. Head is normocephalic and atraumatic. PERRLA, EOMI. Oropharynx is clear. Neck is nontender and supple without adenopathy or JVD. Back is nontender and there is no CVA tenderness. Lungs are clear without rales, wheezes, or rhonchi. Chest is nontender. Heart has regular rate and rhythm without murmur. Abdomen is soft, flat, nontender without masses or hepatosplenomegaly and peristalsis is normoactive. Rectal: Large external hemorrhoid present.  Sphincter tone is slightly decreased.  Small amount of stool present which is brown and tested Hemoccult negative. Extremities have no  cyanosis or edema, full range of motion is present. Skin is warm and dry without rash. Neurologic: Mental status is normal, cranial nerves are intact, there are no motor or sensory deficits.  ED Results / Procedures / Treatments   Labs (all labs ordered are listed, but only abnormal results are displayed) Labs Reviewed  BASIC METABOLIC PANEL - Abnormal; Notable for the following components:      Result Value   Sodium 129 (*)    Chloride 94 (*)    Glucose, Bld 414 (*)    BUN 30 (*)    Calcium 8.8 (*)    All other components within normal limits  CBC - Abnormal; Notable for the following components:   RBC 2.75 (*)    Hemoglobin 8.3 (*)    HCT 26.1 (*)    nRBC 0.3 (*)    All other components within normal limits  CBG MONITORING, ED - Abnormal; Notable for the following components:   Glucose-Capillary 377 (*)    All other components within normal limits  URINALYSIS, ROUTINE W REFLEX MICROSCOPIC  HEPATIC FUNCTION PANEL  LACTIC ACID, PLASMA  LACTIC ACID, PLASMA  VITAMIN B12  FOLATE  IRON AND TIBC  FERRITIN  RETICULOCYTES  POC OCCULT BLOOD, ED  CBG MONITORING, ED  TYPE AND SCREEN   Radiology No results found.  Procedures Procedures   Medications Ordered in ED Medications  lactated ringers bolus 1,000 mL (has no administration in time range)    ED Course  I have reviewed the triage vital signs and the nursing notes.  Pertinent lab results that were available during my care of the patient were reviewed by me and considered in my medical decision making (see chart for details).  MDM Rules/Calculators/A&P Increasing confusion in setting of UTI, for which treatment was just initiated tonight.  Fall with generalized weakness and increasing confusion.  No apparent injury from fall.  Glucose in the ED is 414.  Sodium is slightly low at 129, but appropriate for degree of  hyperglycemia.  Hemoglobin is low at 8.3 which is a significant drop compared with 4/12 at which time  hemoglobin was 10.2.  BUN has increased while creatinine is been stable consistent with upper GI bleed which may have been transient.  Patient's stool is Hemoccult negative, source of blood loss is not clear.  Urinalysis is still pending.  She will need to be admitted for probable IV antibiotics and consideration for possible blood transfusion.  Case is discussed with Dr. Myna Hidalgo of [facility], who agrees to admit the patient.  Final Clinical Impression(s) / ED Diagnoses Final diagnoses:  Acute on chronic anemia  Hyperglycemia    Rx / DC Orders ED Discharge Orders    None       Delora Fuel, MD 79/02/40 [phone]

## 2020-12-09 NOTE — ED Notes (Signed)
ED TO INPATIENT HANDOFF REPORT  Name/Age/Gender Hayley Fisher 87 y.o. female  Code Status    Code Status Orders  (From admission, onward)         Start     Ordered   12/09/20 0617  Full code  Continuous        12/09/20 0617        Code Status History    Date Active Date Inactive Code Status Order ID Comments User Context   07/03/2020 0050 07/06/2020 2358 Full Code [id number]  Chotiner, Yevonne Aline, MD Inpatient   Advance Care Planning Activity      Home/SNF/Other home  Chief Complaint Acute encephalopathy [G93.40]  Level of Care/Admitting Diagnosis ED Disposition    ED Disposition Condition [place]: [facility] [[id number]]  Level of Care: Med-Surg [16]  Covid Evaluation: Asymptomatic Screening Protocol (No Symptoms)  Diagnosis: Acute encephalopathy [914782]  Admitting Physician: Vianne Bulls [[id number]]  Attending Physician: Vianne Bulls [[id number]]       Medical History Past Medical History:  Diagnosis Date  . Arthritis   . Arthritis   . Cataract    bil cateracts removed  . Chronic kidney disease    "spot on one of my kidneys" per pt  . Diabetes mellitus   . Glaucoma   . Hyperkalemia 07/02/2020  . Hyperlipidemia   . Hyperplastic colon polyp   . Hypertension   . Hypothyroidism 12/09/2020  . Metabolic acidosis 96/29/5284  . Syncope   . Thyroid disease     Allergies Allergies  Allergen Reactions  . Levemir [Insulin Detemir] Itching    IV Location/Drains/Wounds Patient Lines/Drains/Airways Status    Active Line/Drains/Airways    Name Placement date Placement time Site Days   Peripheral IV 12/09/20 Left Forearm 12/09/20  0343  Forearm  less than 1          Labs/Imaging Results for orders placed or performed during the hospital encounter of 12/09/20 (from the past 48 hour(s))  CBG monitoring, ED     Status: Abnormal   Collection Time: 12/09/20  2:41 AM  Result Value Ref Range   Glucose-Capillary  377 (H) 70 - 99 mg/dL    Comment: Glucose reference range applies only to samples taken after fasting for at least 8 hours.  Basic metabolic panel     Status: Abnormal   Collection Time: 12/09/20  3:15 AM  Result Value Ref Range   Sodium 129 (L) 135 - 145 mmol/L   Potassium 5.1 3.5 - 5.1 mmol/L   Chloride 94 (L) 98 - 111 mmol/L   CO2 25 22 - 32 mmol/L   Glucose, Bld 414 (H) 70 - 99 mg/dL    Comment: Glucose reference range applies only to samples taken after fasting for at least 8 hours.   BUN 30 (H) 8 - 23 mg/dL   Creatinine, Ser 0.85 0.44 - 1.00 mg/dL   Calcium 8.8 (L) 8.9 - 10.3 mg/dL   GFR, Estimated >60 >60 mL/min    Comment: (NOTE) Calculated using the CKD-EPI Creatinine Equation (2021)    Anion gap 10 5 - 15    Comment: Performed at [facility], [place] [address] [place], [place] [address]  CBC     Status: Abnormal   Collection Time: 12/09/20  3:15 AM  Result Value Ref Range   WBC 6.4 4.0 - 10.5 K/uL   RBC 2.75 (L) 3.87 - 5.11 MIL/uL   Hemoglobin 8.3 (L) 12.0 -  15.0 g/dL   HCT 26.1 (L) 36.0 - 46.0 %   MCV 94.9 80.0 - 100.0 fL   MCH 30.2 26.0 - 34.0 pg   MCHC 31.8 30.0 - 36.0 g/dL   RDW 14.9 11.5 - 15.5 %   Platelets 356 150 - 400 K/uL   nRBC 0.3 (H) 0.0 - 0.2 %    Comment: Performed at [facility], [place] [address], [place], [place] [address]  Hepatic function panel     Status: Abnormal   Collection Time: 12/09/20  3:15 AM  Result Value Ref Range   Total Protein 6.5 6.5 - 8.1 g/dL   Albumin 3.3 (L) 3.5 - 5.0 g/dL   AST 19 15 - 41 U/L   ALT 15 0 - 44 U/L   Alkaline Phosphatase 73 38 - 126 U/L   Total Bilirubin 2.2 (H) 0.3 - 1.2 mg/dL   Bilirubin, Direct 0.7 (H) 0.0 - 0.2 mg/dL   Indirect Bilirubin 1.5 (H) 0.3 - 0.9 mg/dL    Comment: Performed at [facility], [place] [address], [place], [place] [address]  Reticulocytes     Status: Abnormal   Collection Time: 12/09/20  3:15 AM  Result Value Ref Range   Retic Ct  Pct 4.1 (H) 0.4 - 3.1 %   RBC. 2.77 (L) 3.87 - 5.11 MIL/uL   Retic Count, Absolute 114.4 19.0 - 186.0 K/uL   Immature Retic Fract 23.2 (H) 2.3 - 15.9 %    Comment: Performed at [facility], [place] [address], [place], [place] [address]  POC occult blood, ED Provider will collect     Status: None   Collection Time: 12/09/20  5:43 AM  Result Value Ref Range   Fecal Occult Bld NEGATIVE NEGATIVE  POC CBG, ED     Status: Abnormal   Collection Time: 12/09/20  6:01 AM  Result Value Ref Range   Glucose-Capillary 342 (H) 70 - 99 mg/dL    Comment: Glucose reference range applies only to samples taken after fasting for at least 8 hours.   No results found.  Pending Labs Unresulted Labs (From admission, onward)          Start     Ordered   12/10/20 0500  Comprehensive metabolic panel  Tomorrow morning,   R        12/09/20 0617   12/10/20 0500  CBC WITH DIFFERENTIAL  Tomorrow morning,   R        12/09/20 0617   12/09/20 0609  Lactate dehydrogenase  Once,   STAT        12/09/20 0608   12/09/20 0609  Haptoglobin  Once,   STAT        12/09/20 0608   12/09/20 0601  Culture, Urine  Once,   STAT        12/09/20 0600   12/09/20 0547  SARS CORONAVIRUS 2 (TAT 6-24 HRS) Nasopharyngeal Urine, Catheterized  (Tier 3 - Symptomatic/asymptomatic)  Once,   STAT       Question Answer Comment  Is this test for diagnosis or screening Screening   Symptomatic for COVID-19 as defined by CDC No   Hospitalized for COVID-19 No   Admitted to ICU for COVID-19 No   Previously tested for COVID-19 Yes   Resident in a congregate (group) care setting No   Employed in healthcare setting No   Pregnant No   Has patient completed COVID vaccination(s) (2 doses of Pfizer/Moderna 1 dose of [employer]  Johnson) Yes   Has patient completed COVID Booster / 3rd dose Yes      12/09/20 0548   12/09/20 0536  Lactic acid, plasma  Now then every 2 hours,   STAT      12/09/20 0536   12/09/20 0536  Type and screen  [facility]  Once,   STAT       Comments: No Name    12/09/20 0536   12/09/20 0536  Vitamin B12  (Anemia Panel (PNL))  ONCE - STAT,   STAT        12/09/20 0536   12/09/20 0536  Folate  (Anemia Panel (PNL))  ONCE - STAT,   STAT        12/09/20 0536   12/09/20 0536  Iron and TIBC  (Anemia Panel (PNL))  ONCE - STAT,   STAT        12/09/20 0536   12/09/20 0536  Ferritin  (Anemia Panel (PNL))  ONCE - STAT,   STAT        12/09/20 0536   12/09/20 0232  Urinalysis, Routine w reflex microscopic Urine, Catheterized  Once,   STAT        12/09/20 0231          Vitals/Pain Today's Vitals   12/09/20 0353 12/09/20 0400 12/09/20 0430 12/09/20 0500  BP:  119/69 (!) 143/70 127/66  Pulse: (!) 114 (!) 115 (!) 116 (!) 110  Resp: 20 13 (!) 25 14  Temp:      TempSrc:      SpO2: 96% 97% 96% 98%    Isolation Precautions No active isolations  Medications Medications  levothyroxine (SYNTHROID) tablet 50 mcg (50 mcg Oral Given 12/09/20 0175)  famotidine (PEPCID) tablet 20 mg (has no administration in time range)  tamsulosin (FLOMAX) capsule 0.4 mg (has no administration in time range)  insulin glargine (LANTUS) injection 5 Units (has no administration in time range)  insulin aspart (novoLOG) injection 0-9 Units (has no administration in time range)  insulin aspart (novoLOG) injection 0-5 Units (has no administration in time range)  lactated ringers infusion ( Intravenous New Bag/Given 12/09/20 1025)  acetaminophen (TYLENOL) tablet 650 mg (has no administration in time range)    Or  acetaminophen (TYLENOL) suppository 650 mg (has no administration in time range)  polyethylene glycol (MIRALAX / GLYCOLAX) packet 17 g (has no administration in time range)  bisacodyl (DULCOLAX) EC tablet 5 mg (has no administration in time range)  ondansetron (ZOFRAN) tablet 4 mg (has no administration in time range)    Or  ondansetron (ZOFRAN) injection 4 mg (has no  administration in time range)  cefTRIAXone (ROCEPHIN) 1 g in sodium chloride 0.9 % 100 mL IVPB (has no administration in time range)  lactated ringers bolus 1,000 mL (1,000 mLs Intravenous New Bag/Given 12/09/20 0608)    Mobility Assistance needed

## 2020-12-09 NOTE — ED Triage Notes (Signed)
Pt arrived by EMS from home.  EMS called by pt's daughter because pt fell at home. Pt with hx of diabetes, blood glucose pta 535 reported by EMS. EMS reported that  pt lowered herself to the ground, no injuries found.  Daughter reported to EMS that pt had a uti on 4/12 and did not start taking the antibiotic until yesterday 4/19 due to issue picking up the medication.  Daughter told EMS the pt has been experiencing increased weakness and episodes of mania with uti over past few days. BGL 377 on arrival. Pt a&ox3.

## 2020-12-09 NOTE — Progress Notes (Signed)
Hayley Fisher is a 87 y.o. female with a history of IDDM, hypothyroidism, hypertension, anemia who was admitted early this morning by Dr. Myna Hidalgo for acute encephalopathy secondary to a UTI.  Currently, she is awake and alert with family at bedside. She does not appear to be altered mentally. She is complaining of rectal pain from hemorrhoids and states that Preparation H at home does not work. Other than that she denies any complaints   PE: General: Awake and alert, no acute distress  Heart: S1 and S2 auscultated, no murmurs  Lungs: Clear to auscultation bilaterally, no wheeze  Rectal: external hemorrhoids that are not erythematous, swollen, bleeding or tender to palpation   A/P  1. Acute encephalopathy, likely delirium secondary to UTI, hyponatremia and hyperglycemia a. Appears at/near baseline mental status 2. UTI a. Follow up cultures b. Continue IV fluids c. Continue antibiotics 3. External hemorrhoids a. They do not appear thrombosed, no surgical intervention warranted at this time b. Anusol cream BID c. Tucks Pads d. Would hold off on stimulant laxatives like senna or dulcolax and stick with fiber/miralax 4. Insulin Dependent DM with hyperglycemia a. Not in DKA b. Likely from underlying infection c. Continue current management 5. Hypothyroidism a. Continue synthroid 6. Hyperlipidemia a. Continue statin 7. Hypertension a. Stable off ACE-inhibitor    Harold Hedge, DO [facility] Pager [phone]

## 2020-12-10 DIAGNOSIS — Z888 Allergy status to other drugs, medicaments and biological substances status: Secondary | ICD-10-CM | POA: Diagnosis not present

## 2020-12-10 DIAGNOSIS — Z8719 Personal history of other diseases of the digestive system: Secondary | ICD-10-CM | POA: Diagnosis not present

## 2020-12-10 DIAGNOSIS — K5900 Constipation, unspecified: Secondary | ICD-10-CM | POA: Diagnosis not present

## 2020-12-10 DIAGNOSIS — Z833 Family history of diabetes mellitus: Secondary | ICD-10-CM | POA: Diagnosis not present

## 2020-12-10 DIAGNOSIS — Y92009 Unspecified place in unspecified non-institutional (private) residence as the place of occurrence of the external cause: Secondary | ICD-10-CM | POA: Diagnosis not present

## 2020-12-10 DIAGNOSIS — R404 Transient alteration of awareness: Secondary | ICD-10-CM | POA: Diagnosis not present

## 2020-12-10 DIAGNOSIS — K644 Residual hemorrhoidal skin tags: Secondary | ICD-10-CM | POA: Diagnosis present

## 2020-12-10 DIAGNOSIS — K649 Unspecified hemorrhoids: Secondary | ICD-10-CM | POA: Diagnosis not present

## 2020-12-10 DIAGNOSIS — E118 Type 2 diabetes mellitus with unspecified complications: Secondary | ICD-10-CM | POA: Diagnosis not present

## 2020-12-10 DIAGNOSIS — E1122 Type 2 diabetes mellitus with diabetic chronic kidney disease: Secondary | ICD-10-CM | POA: Diagnosis present

## 2020-12-10 DIAGNOSIS — Z90710 Acquired absence of both cervix and uterus: Secondary | ICD-10-CM | POA: Diagnosis not present

## 2020-12-10 DIAGNOSIS — N3001 Acute cystitis with hematuria: Secondary | ICD-10-CM | POA: Diagnosis not present

## 2020-12-10 DIAGNOSIS — W19XXXA Unspecified fall, initial encounter: Secondary | ICD-10-CM | POA: Diagnosis present

## 2020-12-10 DIAGNOSIS — R279 Unspecified lack of coordination: Secondary | ICD-10-CM | POA: Diagnosis not present

## 2020-12-10 DIAGNOSIS — E1169 Type 2 diabetes mellitus with other specified complication: Secondary | ICD-10-CM | POA: Diagnosis not present

## 2020-12-10 DIAGNOSIS — R488 Other symbolic dysfunctions: Secondary | ICD-10-CM | POA: Diagnosis not present

## 2020-12-10 DIAGNOSIS — Z87891 Personal history of nicotine dependence: Secondary | ICD-10-CM | POA: Diagnosis not present

## 2020-12-10 DIAGNOSIS — E871 Hypo-osmolality and hyponatremia: Secondary | ICD-10-CM | POA: Diagnosis not present

## 2020-12-10 DIAGNOSIS — M6281 Muscle weakness (generalized): Secondary | ICD-10-CM | POA: Diagnosis not present

## 2020-12-10 DIAGNOSIS — Z7984 Long term (current) use of oral hypoglycemic drugs: Secondary | ICD-10-CM | POA: Diagnosis not present

## 2020-12-10 DIAGNOSIS — G9340 Encephalopathy, unspecified: Secondary | ICD-10-CM | POA: Diagnosis not present

## 2020-12-10 DIAGNOSIS — R278 Other lack of coordination: Secondary | ICD-10-CM | POA: Diagnosis not present

## 2020-12-10 DIAGNOSIS — D649 Anemia, unspecified: Secondary | ICD-10-CM | POA: Diagnosis not present

## 2020-12-10 DIAGNOSIS — E785 Hyperlipidemia, unspecified: Secondary | ICD-10-CM | POA: Diagnosis present

## 2020-12-10 DIAGNOSIS — E039 Hypothyroidism, unspecified: Secondary | ICD-10-CM | POA: Diagnosis not present

## 2020-12-10 DIAGNOSIS — E872 Acidosis: Secondary | ICD-10-CM | POA: Diagnosis not present

## 2020-12-10 DIAGNOSIS — D631 Anemia in chronic kidney disease: Secondary | ICD-10-CM | POA: Diagnosis present

## 2020-12-10 DIAGNOSIS — Z743 Need for continuous supervision: Secondary | ICD-10-CM | POA: Diagnosis not present

## 2020-12-10 DIAGNOSIS — N189 Chronic kidney disease, unspecified: Secondary | ICD-10-CM | POA: Diagnosis present

## 2020-12-10 DIAGNOSIS — R17 Unspecified jaundice: Secondary | ICD-10-CM | POA: Diagnosis not present

## 2020-12-10 DIAGNOSIS — Z803 Family history of malignant neoplasm of breast: Secondary | ICD-10-CM | POA: Diagnosis not present

## 2020-12-10 DIAGNOSIS — F05 Delirium due to known physiological condition: Secondary | ICD-10-CM | POA: Diagnosis not present

## 2020-12-10 DIAGNOSIS — R739 Hyperglycemia, unspecified: Secondary | ICD-10-CM | POA: Diagnosis not present

## 2020-12-10 DIAGNOSIS — I152 Hypertension secondary to endocrine disorders: Secondary | ICD-10-CM | POA: Diagnosis not present

## 2020-12-10 DIAGNOSIS — Z20822 Contact with and (suspected) exposure to covid-19: Secondary | ICD-10-CM | POA: Diagnosis not present

## 2020-12-10 DIAGNOSIS — E1159 Type 2 diabetes mellitus with other circulatory complications: Secondary | ICD-10-CM | POA: Diagnosis not present

## 2020-12-10 DIAGNOSIS — N390 Urinary tract infection, site not specified: Secondary | ICD-10-CM | POA: Diagnosis not present

## 2020-12-10 DIAGNOSIS — G9341 Metabolic encephalopathy: Secondary | ICD-10-CM | POA: Diagnosis not present

## 2020-12-10 DIAGNOSIS — E1165 Type 2 diabetes mellitus with hyperglycemia: Secondary | ICD-10-CM | POA: Diagnosis not present

## 2020-12-10 LAB — COMPREHENSIVE METABOLIC PANEL
ALT: 31 U/L (ref 0–44)
AST: 47 U/L — ABNORMAL HIGH (ref 15–41)
Albumin: 2.6 g/dL — ABNORMAL LOW (ref 3.5–5.0)
Alkaline Phosphatase: 53 U/L (ref 38–126)
Anion gap: 6 (ref 5–15)
BUN: 23 mg/dL (ref 8–23)
CO2: 28 mmol/L (ref 22–32)
Calcium: 8.6 mg/dL — ABNORMAL LOW (ref 8.9–10.3)
Chloride: 105 mmol/L (ref 98–111)
Creatinine, Ser: 0.83 mg/dL (ref 0.44–1.00)
GFR, Estimated: >60 mL/min (ref >60)
Glucose, Bld: 110 mg/dL — ABNORMAL HIGH (ref 70–99)
Potassium: 4.4 mmol/L (ref 3.5–5.1)
Sodium: 139 mmol/L (ref 135–145)
Total Bilirubin: 1.7 mg/dL — ABNORMAL HIGH (ref 0.3–1.2)
Total Protein: 5.2 g/dL — ABNORMAL LOW (ref 6.5–8.1)

## 2020-12-10 LAB — CBC WITH DIFFERENTIAL/PLATELET
Abs Immature Granulocytes: 0.09 10*3/uL — ABNORMAL HIGH (ref 0.00–0.07)
Basophils Absolute: 0 10*3/uL (ref 0.0–0.1)
Basophils Relative: 0 %
Eosinophils Absolute: 0.1 10*3/uL (ref 0.0–0.5)
Eosinophils Relative: 1 %
HCT: 21 % — ABNORMAL LOW (ref 36.0–46.0)
Hemoglobin: 6.6 g/dL — CL (ref 12.0–15.0)
Immature Granulocytes: 2 %
Lymphocytes Relative: 10 %
Lymphs Abs: 0.4 10*3/uL — ABNORMAL LOW (ref 0.7–4.0)
MCH: 30 pg (ref 26.0–34.0)
MCHC: 31.4 g/dL (ref 30.0–36.0)
MCV: 95.5 fL (ref 80.0–100.0)
Monocytes Absolute: 0.8 10*3/uL (ref 0.1–1.0)
Monocytes Relative: 18 %
Neutro Abs: 3 10*3/uL (ref 1.7–7.7)
Neutrophils Relative %: 69 %
Platelets: 299 10*3/uL (ref 150–400)
RBC: 2.2 MIL/uL — ABNORMAL LOW (ref 3.87–5.11)
RDW: 15.5 % (ref 11.5–15.5)
WBC: 4.3 10*3/uL (ref 4.0–10.5)
nRBC: 1.2 % — ABNORMAL HIGH (ref 0.0–0.2)

## 2020-12-10 LAB — PREPARE RBC (CROSSMATCH)

## 2020-12-10 LAB — GLUCOSE, CAPILLARY
Glucose-Capillary: 168 mg/dL — ABNORMAL HIGH (ref 70–99)
Glucose-Capillary: 274 mg/dL — ABNORMAL HIGH (ref 70–99)
Glucose-Capillary: 297 mg/dL — ABNORMAL HIGH (ref 70–99)
Glucose-Capillary: 313 mg/dL — ABNORMAL HIGH (ref 70–99)

## 2020-12-10 LAB — HEMOGLOBIN AND HEMATOCRIT, BLOOD
HCT: 28.5 % — ABNORMAL LOW (ref 36.0–46.0)
HCT: 29.6 % — ABNORMAL LOW (ref 36.0–46.0)
Hemoglobin: 9.2 g/dL — ABNORMAL LOW (ref 12.0–15.0)
Hemoglobin: 9.5 g/dL — ABNORMAL LOW (ref 12.0–15.0)

## 2020-12-10 LAB — HAPTOGLOBIN: Haptoglobin: 168 mg/dL (ref 41–333)

## 2020-12-10 MED ORDER — DOCUSATE SODIUM 100 MG PO CAPS
100.0000 mg | ORAL_CAPSULE | Freq: Two times a day (BID) | ORAL | Status: DC
  Administered 2020-12-10 – 2020-12-15 (×11): 100 mg via ORAL
  Filled 2020-12-10 (×11): qty 1

## 2020-12-10 MED ORDER — SODIUM CHLORIDE 0.9% IV SOLUTION: Freq: Once | INTRAVENOUS | Status: AC

## 2020-12-10 MED ORDER — INSULIN ASPART 100 UNIT/ML ~~LOC~~ SOLN
2.0000 [IU] | Freq: Three times a day (TID) | SUBCUTANEOUS | Status: DC
  Administered 2020-12-10 – 2020-12-11 (×2): 2 [IU] via SUBCUTANEOUS

## 2020-12-10 NOTE — Progress Notes (Signed)
PROGRESS NOTE    Hayley Fisher    Code Status: Full Code  SAY:[id number] DOB: Nov 05, 1930 DOA: 12/09/2020 LOS: 0 days  PCP: Danna Hefty, DO CC:  Chief Complaint  Patient presents with  . Hyperglycemia       Hospital Summary   This is an 87 year old female with a history of IDDM, hypothyroidism, hypertension, anemia, constipation who was admitted on 4/20 for acute delirium/metabolic encephalopathy thought to be secondary to a UTI. Her encephalopathy has since resolved but she has had a drop in her Hb from 10.2 on 4/12 -> 8.3 on 4/20 and 6.6 on 4/21 which required 1 u PRBCs. She has not had any overt GI bleeding and iron studies indicated acute phase reaction.     A & P   Principal Problem:   Acute encephalopathy Active Problems:   Hypertension associated with diabetes ([place])   Uncontrolled type 2 diabetes mellitus with hyperglycemia ([facility])   UTI (urinary tract infection)   Acute on chronic anemia   Hyperbilirubinemia   Hypothyroidism  1. Acute encephalopathy, likely metabolic and delirium secondary to UTI, hyponatremia and hyperglycemia, resolved a. Appears at/near baseline mental status  2. UTI a. Day 2/3 antibiotics b. Continue ceftriaxone pending urine cultures  3. Acute on chronic normocytic anemia, suspect dilutional  a. Hb 10.2 on 4/12 -> 8.3 on admit -> 6.6 today b. Iron studies indicate an acute phase reaction, FOBT negative, LDH normal and haptoglobin pending c. Hemodynamically stable d. 1 u PRBCs ordered overnight e. Repeat H/H after transfusion f. Transfuse for Hb < 7.0 or symptomatic  4. Sinus tachycardia a. Possibly reactive to anemia and/or infection b. Follow up after blood transfusion  5. External hemorrhoids  Constipation a. They do not appear thrombosed, no surgical intervention warranted at this time b. Anusol cream BID c. Tucks Pads d. Would hold off on stimulant laxatives like senna or dulcolax  e. Continue miralax and add on  docusate  6. Insulin Dependent DM with hyperglycemia, Likely from underlying infection a. Not in DKA b. Hyperglycemia resolved c. Continue current management  7. Hypothyroidism a. Continue synthroid  8. Hyperlipidemia a. Continue statin  9. Hypertension a. Stable off ACE-inhibitor    DVT prophylaxis: SCDs Start: 12/09/20 2355   Family Communication: Patient's daughter has been updated   Disposition Plan:  Status is: Observation  The patient will require care spanning > 2 midnights and should be moved to inpatient because: IV treatments appropriate due to intensity of illness or inability to take PO and Inpatient level of care appropriate due to severity of illness  Dispo: The patient is from: Home              Anticipated d/c is to: Home              Patient currently is not medically stable to d/c.   Difficult to place patient No           Pressure injury documentation    None  Consultants  None  Procedures  4/21: 1 u PRBC  Antibiotics   Anti-infectives (From admission, onward)   Start     Dose/Rate Route Frequency Ordered Stop   12/09/20 0630  cefTRIAXone (ROCEPHIN) 1 g in sodium chloride 0.9 % 100 mL IVPB        1 g 200 mL/hr over 30 Minutes Intravenous Every 24 hours 12/09/20 7322          Subjective   Patient seen and examined at bedside  in no acute distress and resting comfortably. Blood ordered for Hb 6.6 overnight. Complaining of persistent rectal pain from her hemorrhoids. Admits to constipation and denies any bloody BM. Denies any other complaints at this time. Tolerating diet well.   Objective   Vitals:   12/09/20 2013 12/10/20 0057 12/10/20 0535 12/10/20 0802  BP: (!) 101/54 112/60 125/63 129/68  Pulse: (!) 109 (!) 101 (!) 102 97  Resp: 18 15 16 18   Temp: 99.1 F (37.3 C) 98.7 F (37.1 C) 97.9 F (36.6 C) 98.5 F (36.9 C)  TempSrc: Oral Oral Oral Oral  SpO2: 100% 99% 100% 100%    Intake/Output Summary (Last 24 hours) at  12/10/2020 0840 Last data filed at 12/10/2020 0548 Gross per 24 hour  Intake 1212.53 ml  Output 1900 ml  Net -687.47 ml   There were no vitals filed for this visit.  Examination:  Physical Exam Vitals and nursing note reviewed.  Constitutional:      Appearance: Normal appearance.  HENT:     Head: Normocephalic and atraumatic.  Eyes:     Conjunctiva/sclera: Conjunctivae normal.  Cardiovascular:     Rate and Rhythm: Normal rate and regular rhythm.  Pulmonary:     Effort: Pulmonary effort is normal.     Breath sounds: Normal breath sounds.  Abdominal:     General: Abdomen is flat.     Palpations: Abdomen is soft.  Musculoskeletal:        General: No swelling or tenderness.  Skin:    Coloration: Skin is not jaundiced or pale.  Neurological:     Mental Status: She is alert. Mental status is at baseline.  Psychiatric:        Mood and Affect: Mood normal.        Behavior: Behavior normal.     Data Reviewed: I have personally reviewed following labs and imaging studies  CBC: Recent Labs  Lab 12/09/20 0315 12/10/20 0319  WBC 6.4 4.3  NEUTROABS  --  3.0  HGB 8.3* 6.6*  HCT 26.1* 21.0*  MCV 94.9 95.5  PLT 356 101   Basic Metabolic Panel: Recent Labs  Lab 12/09/20 0315 12/09/20 1459 12/10/20 0319  NA 129* 135 139  K 5.1 4.7 4.4  CL 94* 100 105  CO2 25 27 28   GLUCOSE 414* 306* 110*  BUN 30* 25* 23  CREATININE 0.85 0.88 0.83  CALCIUM 8.8* 8.4* 8.6*   GFR: CrCl cannot be calculated (Unknown ideal weight.). Liver Function Tests: Recent Labs  Lab 12/09/20 0315 12/10/20 0319  AST 19 47*  ALT 15 31  ALKPHOS 73 53  BILITOT 2.2* 1.7*  PROT 6.5 5.2*  ALBUMIN 3.3* 2.6*   No results for input(s): LIPASE, AMYLASE in the last 168 hours. No results for input(s): AMMONIA in the last 168 hours. Coagulation Profile: No results for input(s): INR, PROTIME in the last 168 hours. Cardiac Enzymes: No results for input(s): CKTOTAL, CKMB, CKMBINDEX, TROPONINI in the  last 168 hours. BNP (last 3 results) No results for input(s): PROBNP in the last 8760 hours. HbA1C: No results for input(s): HGBA1C in the last 72 hours. CBG: Recent Labs  Lab 12/09/20 0741 12/09/20 1250 12/09/20 1754 12/09/20 2036 12/10/20 0738  GLUCAP 346* 258* 296* 192* 168*   Lipid Profile: No results for input(s): CHOL, HDL, LDLCALC, TRIG, CHOLHDL, LDLDIRECT in the last 72 hours. Thyroid Function Tests: No results for input(s): TSH, T4TOTAL, FREET4, T3FREE, THYROIDAB in the last 72 hours. Anemia Panel: Recent Labs  12/09/20 0315 12/09/20 0609  VITAMINB12  --  609  FOLATE  --  15.2  FERRITIN  --  547*  TIBC  --  260  IRON  --  82  RETICCTPCT 4.1*  --    Sepsis Labs: Recent Labs  Lab 12/09/20 0615  LATICACIDVEN 1.3    Recent Results (from the past 240 hour(s))  Resp Panel by RT-PCR (Flu A&B, Covid) Nasopharyngeal Swab     Status: None   Collection Time: 12/09/20  6:09 AM   Specimen: Nasopharyngeal Swab; Nasopharyngeal(NP) swabs in vial transport medium  Result Value Ref Range Status   SARS Coronavirus 2 by RT PCR NEGATIVE NEGATIVE Final    Comment: (NOTE) SARS-CoV-2 target nucleic acids are NOT DETECTED.  The SARS-CoV-2 RNA is generally detectable in upper respiratory specimens during the acute phase of infection. The lowest concentration of SARS-CoV-2 viral copies this assay can detect is 138 copies/mL. A negative result does not preclude SARS-Cov-2 infection and should not be used as the sole basis for treatment or other patient management decisions. A negative result may occur with  improper specimen collection/handling, submission of specimen other than nasopharyngeal swab, presence of viral mutation(s) within the areas targeted by this assay, and inadequate number of viral copies(<138 copies/mL). A negative result must be combined with clinical observations, patient history, and epidemiological information. The expected result is Negative.  Fact  Sheet for Patients:  EntrepreneurPulse.com.au  Fact Sheet for Healthcare Providers:  IncredibleEmployment.be  This test is no t yet approved or cleared by the [place] FDA and  has been authorized for detection and/or diagnosis of SARS-CoV-2 by FDA under an Emergency Use Authorization (EUA). This EUA will remain  in effect (meaning this test can be used) for the duration of the COVID-19 declaration under Section 564(b)(1) of the Act, 21 U.S.C.section 360bbb-3(b)(1), unless the authorization is terminated  or revoked sooner.       Influenza A by PCR NEGATIVE NEGATIVE Final   Influenza B by PCR NEGATIVE NEGATIVE Final    Comment: (NOTE) The Xpert Xpress SARS-CoV-2/FLU/RSV plus assay is intended as an aid in the diagnosis of influenza from Nasopharyngeal swab specimens and should not be used as a sole basis for treatment. Nasal washings and aspirates are unacceptable for Xpert Xpress SARS-CoV-2/FLU/RSV testing.  Fact Sheet for Patients: EntrepreneurPulse.com.au  Fact Sheet for Healthcare Providers: IncredibleEmployment.be  This test is not yet approved or cleared by the [place] FDA and has been authorized for detection and/or diagnosis of SARS-CoV-2 by FDA under an Emergency Use Authorization (EUA). This EUA will remain in effect (meaning this test can be used) for the duration of the COVID-19 declaration under Section 564(b)(1) of the Act, 21 U.S.C. section 360bbb-3(b)(1), unless the authorization is terminated or revoked.  Performed at [facility], [place] [address], [place], [place] [address]          Radiology Studies: No results found.      Scheduled Meds: . docusate sodium  100 mg Oral BID  . famotidine  20 mg Oral Daily  . hydrocortisone   Rectal BID  . insulin aspart  0-5 Units Subcutaneous QHS  . insulin aspart  0-9 Units Subcutaneous TID WC  . insulin  glargine  5 Units Subcutaneous Daily  . levothyroxine  50 mcg Oral Q0600  . tamsulosin  0.4 mg Oral Daily   Continuous Infusions: . cefTRIAXone (ROCEPHIN)  IV 1 g (12/10/20 6503)     Time spent: 28 minutes with over  50% of the time coordinating the patient's care    Harold Hedge, DO [facility]   Call night coverage person covering after 7pm

## 2020-12-10 NOTE — Progress Notes (Signed)
Critical Hgb 6.6. Paged B. Kyere.

## 2020-12-10 NOTE — Evaluation (Signed)
Physical Therapy Evaluation Patient Details Name: Hayley Fisher MRN: [id number] DOB: 01-21-1931 Today's Date: 12/10/2020   History of Present Illness  Patient is 87 y.o. female admitted on 12/09/20 for acute delirium/metabolic encephalopathy thought to be secondary to a UTI. PMH significant for IDDM, hypothyroidism, HTN, anemia, constipation.    Clinical Impression  Lynsey Ange is 87 y.o. female admitted with above HPI and diagnosis. Patient is currently limited by functional impairments below (see PT problem list). Patient lives alone and reports she has assist from a home aid ~ 4hrs per day to complete ADL's. She uses a rollator for mobilitiy at baseline. Currently patient requires min assist for trasnsfers and gait with RW and was limited to ambulate ~ 10' due to fatigue. Patient will benefit from continued skilled PT interventions to address impairments and progress independence with mobility, recommending SNFvs HHPT pendign progress in acute setting and assist available at discharge. Acute PT will follow and progress as able.     Follow Up Recommendations Home health PT;SNF (HHPT vs SNF pending progress and assist available from family and home aid)    Equipment Recommendations  None recommended by PT    Recommendations for Other Services OT consult     Precautions / Restrictions Precautions Precautions: Fall Restrictions Weight Bearing Restrictions: No      Mobility  Bed Mobility Overal bed mobility: Needs Assistance Bed Mobility: Supine to Sit     Supine to sit: Min guard;HOB elevated     General bed mobility comments: pt taking extra time to pivot to EOB, able to bring LE's off EOB and use bed rail to raise trunk without assist.    Transfers Overall transfer level: Needs assistance Equipment used: Rolling walker (2 wheeled) Transfers: Sit to/from Stand Sit to Stand: Min assist         General transfer comment: cues for technique to rise from EOB, pt using  single UE for power up, min assist to steady with rise to RW.  Ambulation/Gait Ambulation/Gait assistance: Min assist Gait Distance (Feet): 10 Feet Assistive device: Rolling walker (2 wheeled) Gait Pattern/deviations: Step-through pattern;Decreased stride length;Trunk flexed;Shuffle Gait velocity: decr   General Gait Details: cues required for posture and to sequence steps to turn and ambulate towards doorway. assist needed to steady due to shuffling gait and low foot clearance 2/2 decreased hip/knee flexion. assist for walker position. distance limited by fatigue.  Stairs            Wheelchair Mobility    Modified Rankin (Stroke Patients Only)       Balance                                             Pertinent Vitals/Pain Pain Assessment: No/denies pain    Home Living Family/patient expects to be discharged to:: Private residence Living Arrangements: Other relatives Available Help at Discharge: Family Type of Home: House Home Access: [place]: One [place]: Environmental consultant - 4 wheels;Shower seat Additional Comments: pt reports she lives with her older sister and they help each other.    Prior Function Level of Independence: Independent with assistive device(s);Needs assistance   Gait / Transfers Assistance Needed: pt reports she using a rollator for household mobility.     Comments: pt has an aid who assists with bathing/dressing and is there for 4 hours every day.  Hand Dominance        Extremity/Trunk Assessment   Upper Extremity Assessment Upper Extremity Assessment: Defer to OT evaluation;Generalized weakness    Lower Extremity Assessment Lower Extremity Assessment: Generalized weakness    Cervical / Trunk Assessment Cervical / Trunk Assessment: Kyphotic  Communication   Communication: No difficulties  Cognition Arousal/Alertness: Awake/alert Behavior During Therapy: WFL for tasks  assessed/performed Overall Cognitive Status: No family/caregiver present to determine baseline cognitive functioning                                 General Comments: pt is pleasant and A&O to self, place, time, and somewhat situation. She follows all cues/commands safely and in appropriate time.      General Comments      Exercises     Assessment/Plan    PT Assessment Patient needs continued PT services  PT Problem List Decreased strength;Decreased balance;Decreased activity tolerance;Decreased mobility;Decreased knowledge of use of DME;Decreased safety awareness;Decreased knowledge of precautions       PT Treatment Interventions DME instruction;Gait training;Functional mobility training;Therapeutic activities;Therapeutic exercise;Balance training;Neuromuscular re-education;Patient/family education    PT Goals (Current goals can be found in the Care Plan section)  Acute Rehab PT Goals Patient Stated Goal: go home PT Goal Formulation: With patient Time For Goal Achievement: 12/24/20 Potential to Achieve Goals: Good    Frequency Min 3X/week   Barriers to discharge        Co-evaluation               AM-PAC PT "6 Clicks" Mobility  Outcome Measure Help needed turning from your back to your side while in a flat bed without using bedrails?: A Little Help needed moving from lying on your back to sitting on the side of a flat bed without using bedrails?: A Little Help needed moving to and from a bed to a chair (including a wheelchair)?: A Little Help needed standing up from a chair using your arms (e.g., wheelchair or bedside chair)?: A Little Help needed to walk in hospital room?: A Little Help needed climbing 3-5 steps with a railing? : A Lot 6 Click Score: 17    End of Session Equipment Utilized During Treatment: Gait belt Activity Tolerance: Patient tolerated treatment well Patient left: in chair;with call bell/phone within reach;with chair alarm  set;with family/visitor present Nurse Communication: Mobility status PT Visit Diagnosis: Muscle weakness (generalized) (M62.81);Difficulty in walking, not elsewhere classified (R26.2);Unsteadiness on feet (R26.81)    Time: 5784-6962 PT Time Calculation (min) (ACUTE ONLY): 24 min   Charges:   PT Evaluation $PT Eval Low Complexity: 1 Low PT Treatments $Gait Training: 8-22 mins        Verner Mould, DPT Acute Rehabilitation Services Office [phone] Pager [phone]    Jacques Navy 12/10/2020, 5:25 PM

## 2020-12-10 NOTE — Progress Notes (Signed)
Inpatient Diabetes Program Recommendations  AACE/ADA: New Consensus Statement on Inpatient Glycemic Control (2015)  Target Ranges:  Prepandial:   less than 140 mg/dL      Peak postprandial:   less than 180 mg/dL (1-2 hours)      Critically ill patients:  140 - 180 mg/dL   Lab Results  Component Value Date   GLUCAP 274 (H) 12/10/2020   HGBA1C 7.9 (A) 11/20/2020    Review of Glycemic Control Results for Hayley Fisher, Hayley Fisher (MRN [id number]) as of 12/10/2020 12:15  Ref. Range 12/09/2020 07:41 12/09/2020 12:50 12/09/2020 17:54 12/09/2020 20:36 12/10/2020 07:38 12/10/2020 11:35  Glucose-Capillary Latest Ref Range: 70 - 99 mg/dL 346 (H) 258 (H) 296 (H) 192 (H) 168 (H) 274 (H)   Diabetes history: DM 2 Outpatient Diabetes medications:  Lantus 5 units daily Metformin 1000 mg bid Rybelsus 3 mg daily  Hospital meds:  Novolog sensitive tid with meals and HS Lantus 5 units daily  Inpatient Diabetes Program Recommendations:    Consider adding Novolog 2 units tid with meals (hold if patient eats less than 50% or NPO).  Thanks, Adah Perl, RN, BC-ADM Inpatient Diabetes Coordinator Pager [phone] (8a-5p)

## 2020-12-11 LAB — TYPE AND SCREEN
ABO/RH(D): O POS
Antibody Screen: NEGATIVE
Unit division: 0

## 2020-12-11 LAB — BASIC METABOLIC PANEL
Anion gap: 8 (ref 5–15)
BUN: 21 mg/dL (ref 8–23)
CO2: 26 mmol/L (ref 22–32)
Calcium: 8.2 mg/dL — ABNORMAL LOW (ref 8.9–10.3)
Chloride: 100 mmol/L (ref 98–111)
Creatinine, Ser: 0.74 mg/dL (ref 0.44–1.00)
GFR, Estimated: >60 mL/min (ref >60)
Glucose, Bld: 189 mg/dL — ABNORMAL HIGH (ref 70–99)
Potassium: 4.2 mmol/L (ref 3.5–5.1)
Sodium: 134 mmol/L — ABNORMAL LOW (ref 135–145)

## 2020-12-11 LAB — CBC
HCT: 27.9 % — ABNORMAL LOW (ref 36.0–46.0)
Hemoglobin: 9.1 g/dL — ABNORMAL LOW (ref 12.0–15.0)
MCH: 30 pg (ref 26.0–34.0)
MCHC: 32.6 g/dL (ref 30.0–36.0)
MCV: 92.1 fL (ref 80.0–100.0)
Platelets: 279 10*3/uL (ref 150–400)
RBC: 3.03 MIL/uL — ABNORMAL LOW (ref 3.87–5.11)
RDW: 17.2 % — ABNORMAL HIGH (ref 11.5–15.5)
WBC: 4.1 10*3/uL (ref 4.0–10.5)
nRBC: 0.7 % — ABNORMAL HIGH (ref 0.0–0.2)

## 2020-12-11 LAB — BPAM RBC
Blood Product Expiration Date: 202205162359
ISSUE DATE / TIME: 202204210811
Unit Type and Rh: 5100

## 2020-12-11 LAB — GLUCOSE, CAPILLARY
Glucose-Capillary: 223 mg/dL — ABNORMAL HIGH (ref 70–99)
Glucose-Capillary: 319 mg/dL — ABNORMAL HIGH (ref 70–99)
Glucose-Capillary: 347 mg/dL — ABNORMAL HIGH (ref 70–99)
Glucose-Capillary: 204 mg/dL — ABNORMAL HIGH (ref 70–99)

## 2020-12-11 MED ORDER — INSULIN ASPART 100 UNIT/ML ~~LOC~~ SOLN
0.0000 [IU] | Freq: Three times a day (TID) | SUBCUTANEOUS | Status: DC
  Administered 2020-12-12: 5 [IU] via SUBCUTANEOUS
  Administered 2020-12-12: 3 [IU] via SUBCUTANEOUS
  Administered 2020-12-12: 5 [IU] via SUBCUTANEOUS
  Administered 2020-12-13: 11 [IU] via SUBCUTANEOUS
  Administered 2020-12-13: 3 [IU] via SUBCUTANEOUS
  Administered 2020-12-13: 10 [IU] via SUBCUTANEOUS
  Administered 2020-12-14: 15 [IU] via SUBCUTANEOUS
  Administered 2020-12-14: 11 [IU] via SUBCUTANEOUS
  Administered 2020-12-14: 5 [IU] via SUBCUTANEOUS
  Administered 2020-12-15: 2 [IU] via SUBCUTANEOUS
  Administered 2020-12-15: 5 [IU] via SUBCUTANEOUS

## 2020-12-11 MED ORDER — SODIUM CHLORIDE 0.9 % IV SOLN
INTRAVENOUS | Status: DC | PRN
  Administered 2020-12-11 – 2020-12-14 (×3): 250 mL via INTRAVENOUS

## 2020-12-11 MED ORDER — INSULIN GLARGINE 100 UNIT/ML ~~LOC~~ SOLN
7.0000 [IU] | Freq: Every day | SUBCUTANEOUS | Status: DC
  Administered 2020-12-11 – 2020-12-14 (×4): 7 [IU] via SUBCUTANEOUS
  Filled 2020-12-11 (×4): qty 0.07

## 2020-12-11 MED ORDER — INSULIN ASPART 100 UNIT/ML ~~LOC~~ SOLN
3.0000 [IU] | Freq: Three times a day (TID) | SUBCUTANEOUS | Status: DC
  Administered 2020-12-11 – 2020-12-12 (×4): 3 [IU] via SUBCUTANEOUS

## 2020-12-11 MED ORDER — INSULIN ASPART 100 UNIT/ML ~~LOC~~ SOLN
5.0000 [IU] | Freq: Once | SUBCUTANEOUS | Status: AC
  Administered 2020-12-11: 5 [IU] via SUBCUTANEOUS

## 2020-12-11 NOTE — Progress Notes (Signed)
Physical Therapy Treatment Patient Details Name: Hayley Fisher MRN: [id number] DOB: [birth year] Today's Date: 12/11/2020    History of Present Illness Patient is 87 y.o. female admitted on 12/09/20 for acute delirium/metabolic encephalopathy thought to be secondary to a UTI. PMH significant for IDDM, hypothyroidism, HTN, anemia, constipation.    PT Comments    Patient progressing well and increased ambulation distance to 25' in one bout prior to requiring rest break. Patient continues to require min assist and cues for posture and safe management of RW. She required assist for pericare in bathroom due to difficulty reaching and maintaining balance. EOS educated on need for increased assistance at home if pt returns home and on HHPT services. Pt still wishes to go home and does not want to rehab in SNF setting. Acute PT will follow and continue to progress pt as able.    Follow Up Recommendations  Home health PT;SNF (HHPT vs SNF pending progress and assist available from family and home aid)     Equipment Recommendations  None recommended by PT    Recommendations for Other Services OT consult     Precautions / Restrictions Precautions Precautions: Fall Restrictions Weight Bearing Restrictions: No    Mobility  Bed Mobility Overal bed mobility: Needs Assistance Bed Mobility: Supine to Sit     Supine to sit: Min guard;HOB elevated     General bed mobility comments: pt taking extra time to pivot to EOB, able to bring LE's off EOB and use bed rail to raise trunk without assist.    Transfers Overall transfer level: Needs assistance Equipment used: Rolling walker (2 wheeled) Transfers: Sit to/from Stand Sit to Stand: Min assist         General transfer comment: cues for hand placement with power up and assit for rise from EOB, recliner, and toilet.  Ambulation/Gait Ambulation/Gait assistance: Min assist Gait Distance (Feet): 45 Feet (25,12,8) Assistive device: Rolling  walker (2 wheeled) Gait Pattern/deviations: Step-through pattern;Decreased stride length;Trunk flexed;Shuffle Gait velocity: decr   General Gait Details: cues for proximity to RW as pt has tendency to keep walker too far ahead with trunk flexed. pt fatigues easily and requested seated rest after ~25', no overt LOB however min assist for cues and walker mangement.   Stairs             Wheelchair Mobility    Modified Rankin (Stroke Patients Only)       Balance Overall balance assessment: Needs assistance Sitting-balance support: Feet supported;Bilateral upper extremity supported Sitting balance-Leahy Scale: Fair     Standing balance support: During functional activity;Bilateral upper extremity supported Standing balance-Leahy Scale: Poor Standing balance comment: heavily reliant on UE support for gait. pt attempting toileting hygiene; assist needed for pericare as pt unable to effectively reach.                            Cognition Arousal/Alertness: Awake/alert Behavior During Therapy: WFL for tasks assessed/performed Overall Cognitive Status: No family/caregiver present to determine baseline cognitive functioning                                 General Comments: pt is pleasant and A&O to self, place, time, and somewhat situation. She follows all cues/commands safely and in appropriate time. Pt wants to go home.      Exercises      General Comments  Pertinent Vitals/Pain Pain Assessment: No/denies pain    Home Living                      Prior Function            PT Goals (current goals can now be found in the care plan section) Acute Rehab PT Goals Patient Stated Goal: go home PT Goal Formulation: With patient Time For Goal Achievement: 12/24/20 Potential to Achieve Goals: Good Progress towards PT goals: Progressing toward goals    Frequency    Min 3X/week      PT Plan Current plan remains appropriate     Co-evaluation              AM-PAC PT "6 Clicks" Mobility   Outcome Measure  Help needed turning from your back to your side while in a flat bed without using bedrails?: A Little Help needed moving from lying on your back to sitting on the side of a flat bed without using bedrails?: A Little Help needed moving to and from a bed to a chair (including a wheelchair)?: A Little Help needed standing up from a chair using your arms (e.g., wheelchair or bedside chair)?: A Little Help needed to walk in hospital room?: A Little Help needed climbing 3-5 steps with a railing? : A Lot 6 Click Score: 17    End of Session Equipment Utilized During Treatment: Gait belt Activity Tolerance: Patient tolerated treatment well Patient left: in chair;with call bell/phone within reach;with chair alarm set;with family/visitor present Nurse Communication: Mobility status PT Visit Diagnosis: Muscle weakness (generalized) (M62.81);Difficulty in walking, not elsewhere classified (R26.2);Unsteadiness on feet (R26.81)     Time: 5277-8242 PT Time Calculation (min) (ACUTE ONLY): 31 min  Charges:  $Gait Training: 8-22 mins $Therapeutic Activity: 8-22 mins                     Verner Mould, DPT Acute Rehabilitation Services Office [phone] Pager [phone]     Jacques Navy 12/11/2020, 12:33 PM

## 2020-12-11 NOTE — Plan of Care (Signed)
  Problem: Education: Goal: Knowledge of General Education information will improve Description Including pain rating scale, medication(s)/side effects and non-pharmacologic comfort measures Outcome: Progressing   

## 2020-12-11 NOTE — TOC Initial Note (Signed)
Transition of Care [facility]) - Initial/Assessment Note    Patient Details  Name: Hayley Fisher MRN: [id number] Date of Birth: 18-Jul-1931  [facility] CM/SW Contact:    Lennart Pall, LCSW Phone Number: 12/11/2020, 4:58 PM  Clinical Narrative:                 Met briefly with pt and spoke with daughter, [name], today to review dc needs.  Daughter reports that she is currently staying with pt and providing assistance as well as pt having an aide.  Daughter confirms plan is for pt to dc home - not considering SNF. MD has placed orders for HHPT/OT. Daughter requests agency, [facility], as she has had them in the past.  Have contacted Angie with [facility] who is still waiting to here from branch if they can staff pt's case.  Will ask weekend TOC to follow up.  Expected Discharge Plan: [place] Barriers to Discharge: Continued Medical Work up   Patient Goals and CMS Choice Patient states their goals for this hospitalization and ongoing recovery are:: return home      Expected Discharge Plan and Services Expected Discharge Plan: [place] In-house Referral: Clinical Social Work     Living arrangements for the past 2 months: Single Family Home                                      Prior Living Arrangements/Services Living arrangements for the past 2 months: Single Family Home Lives with:: Siblings Patient language and need for interpreter reviewed:: Yes Do you feel safe going back to the place where you live?: Yes      Need for Family Participation in Patient Care: Yes (Comment) Care giver support system in place?: Yes (comment)   Criminal Activity/Legal Involvement Pertinent to Current Situation/Hospitalization: No - Comment as needed  Activities of Daily Living Home Assistive Devices/Equipment: Eyeglasses,CBG Meter,Walker (specify type),Wheelchair,Other (Comment) (self cath kits, front wheeled walker) ADL Screening (condition at  time of admission) Patient's cognitive ability adequate to safely complete daily activities?: No Is the patient deaf or have difficulty hearing?: Yes Does the patient have difficulty seeing, even when wearing glasses/contacts?: Yes Does the patient have difficulty concentrating, remembering, or making decisions?: Yes Patient able to express need for assistance with ADLs?: Yes Does the patient have difficulty dressing or bathing?: Yes Independently performs ADLs?: No Communication: Independent Dressing (OT): Needs assistance Is this a change from baseline?: Change from baseline, expected to last >3 days Grooming: Independent Feeding: Needs assistance Is this a change from baseline?: Change from baseline, expected to last >3 days Bathing: Needs assistance Is this a change from baseline?: Change from baseline, expected to last >3 days Toileting: Needs assistance Is this a change from baseline?: Change from baseline, expected to last >3days In/Out Bed: Needs assistance Is this a change from baseline?: Change from baseline, expected to last >3 days Walks in Home: Needs assistance Is this a change from baseline?: Change from baseline, expected to last >3 days Does the patient have difficulty walking or climbing stairs?: Yes (secondary to weakness) Weakness of Legs: Both Weakness of Arms/Hands: Both  Permission Sought/Granted Permission sought to share information with : Family Supports Permission granted to share information with : Yes, Verbal Permission Granted  Share Information with NAME: [name]     Permission granted to share info w Relationship: daughter  Permission granted to share info w Contact Information: [phone]  Emotional Assessment Appearance:: Appears stated age Attitude/Demeanor/Rapport: Gracious Affect (typically observed): Accepting Orientation: : Oriented to Place,Oriented to Situation Alcohol / Substance Use: Not Applicable Psych Involvement: No  (comment)  Admission diagnosis:  Hyperglycemia [R73.9] Acute encephalopathy [G93.40] Acute on chronic anemia [D64.9] UTI (urinary tract infection) [N39.0] Patient Active Problem List   Diagnosis Date Noted  . Acute encephalopathy 12/09/2020  . Hyperbilirubinemia 12/09/2020  . Hypothyroidism 12/09/2020  . Acute on chronic anemia 09/21/2020  . Gastroesophageal reflux disease without esophagitis 09/16/2020  . Urinary frequency 09/11/2020  . Stage 1 decubitus ulcer 07/30/2020  . Uncontrolled type 2 diabetes mellitus with hyperglycemia ([place]) 07/02/2020  . UTI (urinary tract infection) 07/02/2020  . Skin ulcer of sacrum, limited to breakdown of skin ([place]) 03/16/2017  . Chronic venous insufficiency 11/09/2015  . DDD (degenerative disc disease), lumbosacral 11/24/2014  . Constipation by delayed colonic transit 11/24/2014  . Frequent UTI 10/29/2012  . Type 2 diabetes mellitus with diabetic neuropathy ([place]) 10/19/2006  . Hyperlipidemia associated with type 2 diabetes mellitus ([place]) 10/19/2006  . Hypertension associated with diabetes ([place]) 10/19/2006  . Osteopenia 10/19/2006   PCP:  Danna Hefty, DO Pharmacy:   [facility] [place], [place] - [address] [place] [address] Phone: [phone] Fax: [phone]     Social Determinants of Health ([place]) Interventions    Readmission Risk Interventions Readmission Risk Prevention Plan 07/05/2020  Transportation Screening Complete  Home Care Screening Complete  Medication Review (RN CM) Complete  Some recent data might be hidden

## 2020-12-11 NOTE — Progress Notes (Addendum)
PROGRESS NOTE    Hayley Fisher    Code Status: Full Code  FAO:[id number] DOB: May 13, 1931 DOA: 12/09/2020 LOS: 1 days  PCP: Danna Hefty, DO CC:  Chief Complaint  Patient presents with  . Hyperglycemia       Hospital Summary   This is an 87 year old female with a history of IDDM, hypothyroidism, hypertension, anemia, constipation who was admitted on 4/20 for acute delirium/metabolic encephalopathy thought to be secondary to a UTI. Her encephalopathy has since resolved but she has had a drop in her Hb from 10.2 on 4/12 -> 8.3 on 4/20 and 6.6 on 4/21 which required 1 u PRBCs. She has not had any overt GI bleeding and iron studies indicated acute phase reaction.     A & P   Principal Problem:   Acute encephalopathy Active Problems:   Hypertension associated with diabetes ([place])   Uncontrolled type 2 diabetes mellitus with hyperglycemia (HCC)   UTI (urinary tract infection)   Acute on chronic anemia   Hyperbilirubinemia   Hypothyroidism  1. Acute encephalopathy, likely metabolic and delirium secondary to UTI, hyponatremia and hyperglycemia, resolved  2. UTI a. Day 3/3 ceftriaxone  3. Acute on chronic normocytic anemia, suspect dilutional, resolved a. Hb 10.2 on 4/12 -> 8.3 on admit -> 6.6 today -> received 1 unit PRBCs and now stable at 9 b. Iron studies indicate an acute phase reaction, FOBT negative, LDH normal and haptoglobin pending c. Transfuse for Hb < 7.0 or symptomatic  4. Sinus tachycardia, Possibly reactive to anemia and/or infection a. Resolved with blood transfusion and treatment of infection  5. External hemorrhoids  Constipation, improved a. Significantly improved symptoms today b. Anusol cream BID c. Tucks Pads d. Would hold off on stimulant laxatives like senna or dulcolax  e. Continue miralax and added on docusate this hospitalization  6. Insulin Dependent DM with hyperglycemia, Likely from underlying infection a. Not in DKA b. Increase Lantus to  7 units c. Continue current management  7. Hypothyroidism a. Continue synthroid  8. Hyperlipidemia a. Continue statin  9. Hypertension a. Stable off ACE-inhibitor    DVT prophylaxis: SCDs Start: 12/09/20 0616   Family Communication: daughter updated today  Disposition Plan: Plan to discharge home tomorrow with home health once she has more resources/help available at home Status is: Inpatient  Remains inpatient appropriate because:Unsafe d/c plan   Dispo: The patient is from: Home              Anticipated d/c is to: Home              Patient currently is medically stable to d/c.   Difficult to place patient No                 Pressure injury documentation    None  Consultants  None  Procedures  4/21: 1 u PRBC  Antibiotics   Anti-infectives (From admission, onward)   Start     Dose/Rate Route Frequency Ordered Stop   12/09/20 0630  cefTRIAXone (ROCEPHIN) 1 g in sodium chloride 0.9 % 100 mL IVPB        1 g 200 mL/hr over 30 Minutes Intravenous Every 24 hours 12/09/20 6962          Subjective   Patient feeling much better today and states that her hemorrhoids are significantly improved symptomatically.  She denies any complaints at this time and no overnight events.  Objective   Vitals:   12/10/20 0841 12/10/20 1157 12/10/20  2052 12/11/20 0608  BP: 129/76 (!) 152/96 118/69 139/74  Pulse: (!) 102 (!) 109 95 92  Resp: 18 18 18 15   Temp: 98.4 F (36.9 C) 97.9 F (36.6 C) 98.1 F (36.7 C) 98 F (36.7 C)  TempSrc: Oral Oral Oral Oral  SpO2: 98% 100%  100%  Weight:  59.8 kg    Height:  5' 0.98" (1.549 m)      Intake/Output Summary (Last 24 hours) at 12/11/2020 1235 Last data filed at 12/11/2020 0920 Gross per 24 hour  Intake 60 ml  Output 1050 ml  Net -990 ml   Filed Weights   12/10/20 1157  Weight: 59.8 kg    Examination:  Physical Exam Vitals and nursing note reviewed.  Constitutional:      Appearance: Normal appearance.   HENT:     Head: Normocephalic and atraumatic.  Eyes:     Conjunctiva/sclera: Conjunctivae normal.  Cardiovascular:     Rate and Rhythm: Normal rate and regular rhythm.  Pulmonary:     Effort: Pulmonary effort is normal.     Breath sounds: Normal breath sounds.  Abdominal:     General: Abdomen is flat.     Palpations: Abdomen is soft.  Musculoskeletal:        General: No swelling or tenderness.  Skin:    Coloration: Skin is not jaundiced or pale.  Neurological:     Mental Status: She is alert. Mental status is at baseline.  Psychiatric:        Mood and Affect: Mood normal.        Behavior: Behavior normal.     Data Reviewed: I have personally reviewed following labs and imaging studies  CBC: Recent Labs  Lab 12/09/20 0315 12/10/20 0319 12/10/20 1430 12/10/20 2023 12/11/20 0307  WBC 6.4 4.3  --   --  4.1  NEUTROABS  --  3.0  --   --   --   HGB 8.3* 6.6* 9.2* 9.5* 9.1*  HCT 26.1* 21.0* 28.5* 29.6* 27.9*  MCV 94.9 95.5  --   --  92.1  PLT 356 299  --   --  [id number]   Basic Metabolic Panel: Recent Labs  Lab 12/09/20 0315 12/09/20 1459 12/10/20 0319 12/11/20 0307  NA 129* 135 139 134*  K 5.1 4.7 4.4 4.2  CL 94* 100 105 100  CO2 25 27 28 26   GLUCOSE 414* 306* 110* 189*  BUN 30* 25* 23 21  CREATININE 0.85 0.88 0.83 0.74  CALCIUM 8.8* 8.4* 8.6* 8.2*   GFR: Estimated Creatinine Clearance: 41.1 mL/min (by C-G formula based on SCr of 0.74 mg/dL). Liver Function Tests: Recent Labs  Lab 12/09/20 0315 12/10/20 0319  AST 19 47*  ALT 15 31  ALKPHOS 73 53  BILITOT 2.2* 1.7*  PROT 6.5 5.2*  ALBUMIN 3.3* 2.6*   No results for input(s): LIPASE, AMYLASE in the last 168 hours. No results for input(s): AMMONIA in the last 168 hours. Coagulation Profile: No results for input(s): INR, PROTIME in the last 168 hours. Cardiac Enzymes: No results for input(s): CKTOTAL, CKMB, CKMBINDEX, TROPONINI in the last 168 hours. BNP (last 3 results) No results for input(s): PROBNP  in the last 8760 hours. HbA1C: No results for input(s): HGBA1C in the last 72 hours. CBG: Recent Labs  Lab 12/10/20 1135 12/10/20 1702 12/10/20 2049 12/11/20 0811 12/11/20 1147  GLUCAP 274* 297* 313* 223* 319*   Lipid Profile: No results for input(s): CHOL, HDL, LDLCALC, TRIG, CHOLHDL, LDLDIRECT in the  last 72 hours. Thyroid Function Tests: No results for input(s): TSH, T4TOTAL, FREET4, T3FREE, THYROIDAB in the last 72 hours. Anemia Panel: Recent Labs    12/09/20 0315 12/09/20 0609  VITAMINB12  --  609  FOLATE  --  15.2  FERRITIN  --  547*  TIBC  --  260  IRON  --  82  RETICCTPCT 4.1*  --    Sepsis Labs: Recent Labs  Lab 12/09/20 0615  LATICACIDVEN 1.3    Recent Results (from the past 240 hour(s))  Resp Panel by RT-PCR (Flu A&B, Covid) Nasopharyngeal Swab     Status: None   Collection Time: 12/09/20  6:09 AM   Specimen: Nasopharyngeal Swab; Nasopharyngeal(NP) swabs in vial transport medium  Result Value Ref Range Status   SARS Coronavirus 2 by RT PCR NEGATIVE NEGATIVE Final    Comment: (NOTE) SARS-CoV-2 target nucleic acids are NOT DETECTED.  The SARS-CoV-2 RNA is generally detectable in upper respiratory specimens during the acute phase of infection. The lowest concentration of SARS-CoV-2 viral copies this assay can detect is 138 copies/mL. A negative result does not preclude SARS-Cov-2 infection and should not be used as the sole basis for treatment or other patient management decisions. A negative result may occur with  improper specimen collection/handling, submission of specimen other than nasopharyngeal swab, presence of viral mutation(s) within the areas targeted by this assay, and inadequate number of viral copies(<138 copies/mL). A negative result must be combined with clinical observations, patient history, and epidemiological information. The expected result is Negative.  Fact Sheet for Patients:  EntrepreneurPulse.com.au  Fact  Sheet for Healthcare Providers:  IncredibleEmployment.be  This test is no t yet approved or cleared by the [place] FDA and  has been authorized for detection and/or diagnosis of SARS-CoV-2 by FDA under an Emergency Use Authorization (EUA). This EUA will remain  in effect (meaning this test can be used) for the duration of the COVID-19 declaration under Section 564(b)(1) of the Act, 21 U.S.C.section 360bbb-3(b)(1), unless the authorization is terminated  or revoked sooner.       Influenza A by PCR NEGATIVE NEGATIVE Final   Influenza B by PCR NEGATIVE NEGATIVE Final    Comment: (NOTE) The Xpert Xpress SARS-CoV-2/FLU/RSV plus assay is intended as an aid in the diagnosis of influenza from Nasopharyngeal swab specimens and should not be used as a sole basis for treatment. Nasal washings and aspirates are unacceptable for Xpert Xpress SARS-CoV-2/FLU/RSV testing.  Fact Sheet for Patients: EntrepreneurPulse.com.au  Fact Sheet for Healthcare Providers: IncredibleEmployment.be  This test is not yet approved or cleared by the [place] FDA and has been authorized for detection and/or diagnosis of SARS-CoV-2 by FDA under an Emergency Use Authorization (EUA). This EUA will remain in effect (meaning this test can be used) for the duration of the COVID-19 declaration under Section 564(b)(1) of the Act, 21 U.S.C. section 360bbb-3(b)(1), unless the authorization is terminated or revoked.  Performed at [facility], [place] [address] [place], [place] [address]          Radiology Studies: No results found.      Scheduled Meds: . docusate sodium  100 mg Oral BID  . famotidine  20 mg Oral Daily  . hydrocortisone   Rectal BID  . insulin aspart  0-5 Units Subcutaneous QHS  . insulin aspart  0-9 Units Subcutaneous TID WC  . insulin aspart  2 Units Subcutaneous TID WC  . insulin glargine  7 Units  Subcutaneous Daily  .  levothyroxine  50 mcg Oral Q0600  . tamsulosin  0.4 mg Oral Daily   Continuous Infusions: . cefTRIAXone (ROCEPHIN)  IV 1 g (12/11/20 0538)     Time spent: 20 minutes with over 50% of the time coordinating the patient's care    Harold Hedge, DO [facility]   Call night coverage person covering after 7pm

## 2020-12-12 LAB — URINALYSIS, ROUTINE W REFLEX MICROSCOPIC
Bacteria, UA: NONE SEEN
Bilirubin Urine: NEGATIVE
Glucose, UA: >=500 mg/dL — AB
Ketones, ur: NEGATIVE mg/dL
Nitrite: NEGATIVE
Protein, ur: 100 mg/dL — AB
Specific Gravity, Urine: 1.015 (ref 1.005–1.030)
WBC, UA: >50 WBC/hpf — ABNORMAL HIGH (ref 0–5)
pH: 5 (ref 5.0–8.0)

## 2020-12-12 LAB — GLUCOSE, CAPILLARY
Glucose-Capillary: 159 mg/dL — ABNORMAL HIGH (ref 70–99)
Glucose-Capillary: 223 mg/dL — ABNORMAL HIGH (ref 70–99)
Glucose-Capillary: 209 mg/dL — ABNORMAL HIGH (ref 70–99)
Glucose-Capillary: 106 mg/dL — ABNORMAL HIGH (ref 70–99)

## 2020-12-12 MED ORDER — CARVEDILOL 6.25 MG PO TABS
6.2500 mg | ORAL_TABLET | Freq: Two times a day (BID) | ORAL | Status: DC
  Administered 2020-12-12 – 2020-12-13 (×2): 6.25 mg via ORAL
  Filled 2020-12-12 (×2): qty 1

## 2020-12-12 MED ORDER — INSULIN ASPART 100 UNIT/ML ~~LOC~~ SOLN
5.0000 [IU] | Freq: Three times a day (TID) | SUBCUTANEOUS | Status: DC
  Administered 2020-12-13 – 2020-12-15 (×8): 5 [IU] via SUBCUTANEOUS

## 2020-12-12 MED ORDER — CARVEDILOL 6.25 MG PO TABS: 6.2500 mg | ORAL_TABLET | Freq: Two times a day (BID) | ORAL | Status: DC

## 2020-12-12 MED ORDER — FLUCONAZOLE 100 MG PO TABS
100.0000 mg | ORAL_TABLET | Freq: Once | ORAL | Status: AC
  Administered 2020-12-12: 100 mg via ORAL
  Filled 2020-12-12: qty 1

## 2020-12-12 MED ORDER — METOPROLOL TARTRATE 5 MG/5ML IV SOLN: 2.5000 mg | Freq: Once | INTRAVENOUS | Status: DC

## 2020-12-12 NOTE — TOC Progression Note (Signed)
Transition of Care [facility]) - Progression Note    Patient Details  Name: Hayley Fisher MRN: [id number] Date of Birth: [birth year]  Transition of Care [facility]) CM/SW Contact  Ross Ludwig, [place] Phone Number: 12/12/2020, 5:10 PM  Clinical Narrative:     Patient has been faxed out for SNF placement awaiting bed offers.  Patient will also need insurance authorization.   Expected Discharge Plan: [place] Barriers to Discharge: Continued Medical Work up  Expected Discharge Plan and Services Expected Discharge Plan: [place] In-house Referral: Clinical Social Work     Living arrangements for the past 2 months: [place]: PT,OT [place]: [place] Date [place]: 12/12/20 Time [place]: [id number] Representative spoke with at [place]: [place] ([place]) Interventions    Readmission Risk Interventions Readmission Risk Prevention Plan 07/05/2020  Transportation Screening Complete  Home Care Screening Complete  Medication Review (RN CM) Complete  Some recent data might be hidden

## 2020-12-12 NOTE — TOC Progression Note (Signed)
Transition of Care [facility]) - Progression Note    Patient Details  Name: Hayley Fisher MRN: [id number] Date of Birth: [birth year]  Transition of [facility]) CM/SW Contact  Joaquin Courts, RN Phone Number: 12/12/2020, 1:28 PM  Clinical Narrative:    CM reached out to patient's daughter to update on status of [place] services, unfortunately [facility] is unable to service.  CM confirmed with [facility] rep Tommi Rumps that agency would be ab;e to provide HHPT/OT services, daughter is aware and in agreement with [facility].  [name] (daughter) reports she is now ill and will not be abel to manage her mothers care in the home, reports she is now in agreement to place patient in SNF for short term rehab.  CM discussed placement process and spoke with CSW to initiate bed search.  [name] states she would prefer [facility] but is agreeable to a broad facility search in case [facility] is unable to offer bed.      Expected Discharge Plan: [place] Barriers to Discharge: Continued Medical Work up  Expected Discharge Plan and Services Expected Discharge Plan: [place] In-house Referral: Clinical Social Work     Living arrangements for the past 2 months: [place]: PT,OT [place]: [place] Date [place]: 12/12/20 Time [place]: 9924 Representative spoke with at [place]: [place] ([place]) Interventions    Readmission Risk Interventions Readmission Risk Prevention Plan 07/05/2020  Transportation Screening Complete  Home Care Screening Complete  Medication Review (RN CM) Complete  Some recent data might be hidden

## 2020-12-12 NOTE — NC FL2 (Signed)
[place] LEVEL OF CARE SCREENING TOOL     IDENTIFICATION  Patient Name: Hayley Fisher Birthdate: 03-10-1931 Sex: female Admission Date (Current Location): 12/09/2020  [place] and [place] Number:  Kathleen Argue [phone] [place] and Address:  [facility],  [place], [place]      Provider Number: [id number]  Attending Physician Name and Address:  Harold Hedge, MD  Relative Name and Phone Number:  [name] Daughter [phone]  [phone]  [name] Sister [phone]    Current Level of Care: Hospital Recommended Level of Care: [place] Prior Approval Number:    Date Approved/Denied:   PASRR Number: [id number]  Discharge Plan: SNF    Current Diagnoses: Patient Active Problem List   Diagnosis Date Noted  . Acute encephalopathy 12/09/2020  . Hyperbilirubinemia 12/09/2020  . Hypothyroidism 12/09/2020  . Acute on chronic anemia 09/21/2020  . Gastroesophageal reflux disease without esophagitis 09/16/2020  . Urinary frequency 09/11/2020  . Stage 1 decubitus ulcer 07/30/2020  . Uncontrolled type 2 diabetes mellitus with hyperglycemia ([place]) 07/02/2020  . UTI (urinary tract infection) 07/02/2020  . Skin ulcer of sacrum, limited to breakdown of skin ([place]) 03/16/2017  . Chronic venous insufficiency 11/09/2015  . DDD (degenerative disc disease), lumbosacral 11/24/2014  . Constipation by delayed colonic transit 11/24/2014  . Frequent UTI 10/29/2012  . Type 2 diabetes mellitus with diabetic neuropathy ([place]) 10/19/2006  . Hyperlipidemia associated with type 2 diabetes mellitus ([place]) 10/19/2006  . Hypertension associated with diabetes ([place]) 10/19/2006  . Osteopenia 10/19/2006    Orientation RESPIRATION BLADDER Height & Weight     Self,Place,Situation,Time  Normal Incontinent Weight: 131 lb 13.4 oz (59.8 kg) Height:  5' 0.98" (154.9 cm)  BEHAVIORAL SYMPTOMS/MOOD NEUROLOGICAL BOWEL NUTRITION STATUS       Continent Diet  AMBULATORY STATUS COMMUNICATION OF NEEDS Skin   Limited Assist Verbally Normal                       Personal Care Assistance Level of Assistance  Bathing,Dressing,Feeding Bathing Assistance: Limited assistance Feeding assistance: Independent Dressing Assistance: Limited assistance     Functional Limitations Info  Sight,Speech,Hearing Sight Info: Adequate Hearing Info: Adequate Speech Info: Adequate    SPECIAL CARE FACTORS FREQUENCY  PT (By licensed PT),OT (By licensed OT)     PT Frequency: Minimum 5x a week OT Frequency: Minimum 5x a week            Contractures Contractures Info: Not present    Additional Factors Info  Code Status,Allergies Code Status Info: Full Code Allergies Info: Levemir           Current Medications (12/12/2020):  This is the current hospital active medication list Current Facility-Administered Medications  Medication Dose Route Frequency Provider Last Rate Last Admin  . 0.9 %  sodium chloride infusion   Intravenous PRN Harold Hedge, MD 10 mL/hr at 12/12/20 1648 250 mL at 12/12/20 1648  . acetaminophen (TYLENOL) tablet 650 mg  650 mg Oral Q6H PRN Opyd, Ilene Qua, MD   650 mg at 12/10/20 2106   Or  . acetaminophen (TYLENOL) suppository 650 mg  650 mg Rectal Q6H PRN Opyd, Ilene Qua, MD      . cefTRIAXone (ROCEPHIN) 1 g in sodium chloride 0.9 % 100 mL IVPB  1 g Intravenous Q24H Opyd, Ilene Qua, MD 200 mL/hr at 12/12/20 0648 1 g at 12/12/20 0648  . docusate sodium (COLACE) capsule 100 mg  100 mg Oral BID  Harold Hedge, MD   100 mg at 12/12/20 0845  . famotidine (PEPCID) tablet 20 mg  20 mg Oral Daily Opyd, Ilene Qua, MD   20 mg at 12/12/20 0845  . hydrocortisone (ANUSOL-HC) 2.5 % rectal cream   Rectal BID Harold Hedge, MD   Given at 12/12/20 [phone]  . insulin aspart (novoLOG) injection 0-15 Units  0-15 Units Subcutaneous TID WC Harold Hedge, MD   5 Units at 12/12/20 1649  . insulin aspart (novoLOG) injection 0-5 Units   0-5 Units Subcutaneous QHS Vianne Bulls, MD   2 Units at 12/11/20 2237  . insulin aspart (novoLOG) injection 3 Units  3 Units Subcutaneous TID WC Harold Hedge, MD   3 Units at 12/12/20 1650  . insulin glargine (LANTUS) injection 7 Units  7 Units Subcutaneous Daily Harold Hedge, MD   7 Units at 12/12/20 [phone]  . levothyroxine (SYNTHROID) tablet 50 mcg  50 mcg Oral Q0600 Vianne Bulls, MD   50 mcg at 12/12/20 [phone]  . ondansetron (ZOFRAN) tablet 4 mg  4 mg Oral Q6H PRN Opyd, Ilene Qua, MD       Or  . ondansetron (ZOFRAN) injection 4 mg  4 mg Intravenous Q6H PRN Opyd, Ilene Qua, MD      . polyethylene glycol (MIRALAX / GLYCOLAX) packet 17 g  17 g Oral Daily PRN Opyd, Ilene Qua, MD      . tamsulosin (FLOMAX) capsule 0.4 mg  0.4 mg Oral Daily Opyd, Ilene Qua, MD   0.4 mg at 12/12/20 0845  . witch hazel-glycerin (TUCKS) pad   Topical PRN Harold Hedge, MD   1 application at 93/81/01 2107     Discharge Medications: Please see discharge summary for a list of discharge medications.  Relevant Imaging Results:  Relevant Lab Results:   Additional Information SSN [id number]  Ross Ludwig, LCSW

## 2020-12-12 NOTE — Plan of Care (Signed)

## 2020-12-12 NOTE — Plan of Care (Signed)
°  Problem: Health Behavior/Discharge Planning: °Goal: Ability to manage health-related needs will improve °Outcome: Progressing °  °Problem: Skin Integrity: °Goal: Risk for impaired skin integrity will decrease °Outcome: Progressing °  °

## 2020-12-12 NOTE — Progress Notes (Signed)
PROGRESS NOTE    Hayley Fisher    Code Status: Full Code  RFX:[id number] DOB: 14-Oct-1930 DOA: 12/09/2020 LOS: 2 days  PCP: Danna Hefty, DO CC:  Chief Complaint  Patient presents with  . Hyperglycemia       Hospital Summary   This is an 87 year old female with a history of IDDM, hypothyroidism, hypertension, anemia, constipation who was admitted on 4/20 for acute delirium/metabolic encephalopathy thought to be secondary to a UTI. Her encephalopathy has since resolved but she has had a drop in her Hb from 10.2 on 4/12 -> 8.3 on 4/20 and 6.6 on 4/21 which required 1 u PRBCs. She has not had any overt GI bleeding and iron studies indicated acute phase reaction.   Patient completed 3 days of ceftriaxone for a UTI and had improvement in her blood sugars.  She was evaluated by PT who recommended Home health or SNF.  Patient and family initially opted for home health and when she was medically stable for discharge on 4/23 family changed their mind to SNF.  Discharge was canceled for 4/23.    A & P   Principal Problem:   Acute encephalopathy Active Problems:   Hypertension associated with diabetes ([place])   Uncontrolled type 2 diabetes mellitus with hyperglycemia ([facility])   UTI (urinary tract infection)   Acute on chronic anemia   Hyperbilirubinemia   Hypothyroidism  1. Acute encephalopathy, likely metabolic and delirium secondary to UTI, hyponatremia and hyperglycemia, resolved  2. UTI a. Completed 3 days ceftriaxone b. UA cloudy with sediment on exam today and noted multiple positive UAs in the past with yeast c. repeat UA d. Given fluconazole x1  3. Acute on chronic normocytic anemia, suspect dilutional, resolved a. Hb 10.2 on 4/12 -> 8.3 on admit -> 6.6 -> received 1 unit PRBCs and now stable b. Iron studies indicate an acute phase reaction, FOBT negative, LDH normal and haptoglobin negative c. Transfuse for Hb < 7.0 or symptomatic  4. Sinus tachycardia, Possibly reactive  to anemia and/or infection a. Coreg as below  5. External hemorrhoids  Constipation, improved a. Symptoms resolved b. Anusol cream BID c. Tucks Pads d. Would hold off on stimulant laxatives like senna or dulcolax  e. Continue miralax and added on docusate this hospitalization  6. Insulin Dependent DM with hyperglycemia a. HbA1c 7.9 b. Continue Lantus 7 units, NovoLog increased to 5 units 3 times daily with meals, moderate and at bedtime sliding scale  7. Hypothyroidism a. Continue synthroid  8. Hyperlipidemia a. Continue statin  9. Hypertension a. Will start Coreg due to tachycardia and hypertension    DVT prophylaxis: SCDs Start: 12/09/20 0616   Family Communication: daughter updated today   Disposition Plan:  available at home Status is: Inpatient  Remains inpatient appropriate because:Unsafe d/c plan   Dispo: The patient is from: Home              Anticipated d/c is to: SNF              Patient currently is medically stable to d/c.   Difficult to place patient No                 Pressure injury documentation    None  Consultants  None  Procedures  4/21: 1 u PRBC  Antibiotics   Anti-infectives (From admission, onward)   Start     Dose/Rate Route Frequency Ordered Stop   12/12/20 1000  fluconazole (DIFLUCAN) tablet 100 mg  100 mg Oral  Once 12/12/20 0912 12/12/20 1104   12/09/20 0630  cefTRIAXone (ROCEPHIN) 1 g in sodium chloride 0.9 % 100 mL IVPB        1 g 200 mL/hr over 30 Minutes Intravenous Every 24 hours 12/09/20 4818          Subjective   Patient feels well today with no complaints and no overnight events. Objective   Vitals:   12/11/20 0608 12/11/20 2148 12/12/20 0622 12/12/20 1354  BP: 139/74 (!) 121/92 (!) 153/76 (!) 141/77  Pulse: 92 (!) 104 100 (!) 101  Resp: 15 17 20 19   Temp: 98 F (36.7 C) 99 F (37.2 C) 98.6 F (37 C) 99.7 F (37.6 C)  TempSrc: Oral Oral Oral Oral  SpO2: 100% 98% 100% 100%  Weight:       Height:        Intake/Output Summary (Last 24 hours) at 12/12/2020 1723 Last data filed at 12/12/2020 1400 Gross per 24 hour  Intake 1017.56 ml  Output 725 ml  Net 292.56 ml   Filed Weights   12/10/20 1157  Weight: 59.8 kg    Examination:  Physical Exam Vitals and nursing note reviewed.  Constitutional:      Appearance: Normal appearance.  HENT:     Head: Normocephalic and atraumatic.  Eyes:     Conjunctiva/sclera: Conjunctivae normal.  Cardiovascular:     Rate and Rhythm: Normal rate and regular rhythm.  Pulmonary:     Effort: Pulmonary effort is normal.     Breath sounds: Normal breath sounds.  Abdominal:     General: Abdomen is flat.     Palpations: Abdomen is soft.  Genitourinary:    Comments: Urine yellow and cloudy with white sediment Musculoskeletal:        General: No swelling or tenderness.  Skin:    Coloration: Skin is not jaundiced or pale.  Neurological:     Mental Status: She is alert. Mental status is at baseline.  Psychiatric:        Mood and Affect: Mood normal.        Behavior: Behavior normal.     Data Reviewed: I have personally reviewed following labs and imaging studies  CBC: Recent Labs  Lab 12/09/20 0315 12/10/20 0319 12/10/20 1430 12/10/20 2023 12/11/20 0307  WBC 6.4 4.3  --   --  4.1  NEUTROABS  --  3.0  --   --   --   HGB 8.3* 6.6* 9.2* 9.5* 9.1*  HCT 26.1* 21.0* 28.5* 29.6* 27.9*  MCV 94.9 95.5  --   --  92.1  PLT 356 299  --   --  563   Basic Metabolic Panel: Recent Labs  Lab 12/09/20 0315 12/09/20 1459 12/10/20 0319 12/11/20 0307  NA 129* 135 139 134*  K 5.1 4.7 4.4 4.2  CL 94* 100 105 100  CO2 25 27 28 26   GLUCOSE 414* 306* 110* 189*  BUN 30* 25* 23 21  CREATININE 0.85 0.88 0.83 0.74  CALCIUM 8.8* 8.4* 8.6* 8.2*   GFR: Estimated Creatinine Clearance: 41.1 mL/min (by C-G formula based on SCr of 0.74 mg/dL). Liver Function Tests: Recent Labs  Lab 12/09/20 0315 12/10/20 0319  AST 19 47*  ALT 15 31   ALKPHOS 73 53  BILITOT 2.2* 1.7*  PROT 6.5 5.2*  ALBUMIN 3.3* 2.6*   No results for input(s): LIPASE, AMYLASE in the last 168 hours. No results for input(s): AMMONIA in the last 168 hours. Coagulation  Profile: No results for input(s): INR, PROTIME in the last 168 hours. Cardiac Enzymes: No results for input(s): CKTOTAL, CKMB, CKMBINDEX, TROPONINI in the last 168 hours. BNP (last 3 results) No results for input(s): PROBNP in the last 8760 hours. HbA1C: No results for input(s): HGBA1C in the last 72 hours. CBG: Recent Labs  Lab 12/11/20 1709 12/11/20 2150 12/12/20 0735 12/12/20 1149 12/12/20 1633  GLUCAP 347* 204* 159* 223* 209*   Lipid Profile: No results for input(s): CHOL, HDL, LDLCALC, TRIG, CHOLHDL, LDLDIRECT in the last 72 hours. Thyroid Function Tests: No results for input(s): TSH, T4TOTAL, FREET4, T3FREE, THYROIDAB in the last 72 hours. Anemia Panel: No results for input(s): VITAMINB12, FOLATE, FERRITIN, TIBC, IRON, RETICCTPCT in the last 72 hours. Sepsis Labs: Recent Labs  Lab 12/09/20 0615  LATICACIDVEN 1.3    Recent Results (from the past 240 hour(s))  Resp Panel by RT-PCR (Flu A&B, Covid) Nasopharyngeal Swab     Status: None   Collection Time: 12/09/20  6:09 AM   Specimen: Nasopharyngeal Swab; Nasopharyngeal(NP) swabs in vial transport medium  Result Value Ref Range Status   SARS Coronavirus 2 by RT PCR NEGATIVE NEGATIVE Final    Comment: (NOTE) SARS-CoV-2 target nucleic acids are NOT DETECTED.  The SARS-CoV-2 RNA is generally detectable in upper respiratory specimens during the acute phase of infection. The lowest concentration of SARS-CoV-2 viral copies this assay can detect is 138 copies/mL. A negative result does not preclude SARS-Cov-2 infection and should not be used as the sole basis for treatment or other patient management decisions. A negative result may occur with  improper specimen collection/handling, submission of specimen other than  nasopharyngeal swab, presence of viral mutation(s) within the areas targeted by this assay, and inadequate number of viral copies(<138 copies/mL). A negative result must be combined with clinical observations, patient history, and epidemiological information. The expected result is Negative.  Fact Sheet for Patients:  EntrepreneurPulse.com.au  Fact Sheet for Healthcare Providers:  IncredibleEmployment.be  This test is no t yet approved or cleared by the [place] FDA and  has been authorized for detection and/or diagnosis of SARS-CoV-2 by FDA under an Emergency Use Authorization (EUA). This EUA will remain  in effect (meaning this test can be used) for the duration of the COVID-19 declaration under Section 564(b)(1) of the Act, 21 U.S.C.section 360bbb-3(b)(1), unless the authorization is terminated  or revoked sooner.       Influenza A by PCR NEGATIVE NEGATIVE Final   Influenza B by PCR NEGATIVE NEGATIVE Final    Comment: (NOTE) The Xpert Xpress SARS-CoV-2/FLU/RSV plus assay is intended as an aid in the diagnosis of influenza from Nasopharyngeal swab specimens and should not be used as a sole basis for treatment. Nasal washings and aspirates are unacceptable for Xpert Xpress SARS-CoV-2/FLU/RSV testing.  Fact Sheet for Patients: EntrepreneurPulse.com.au  Fact Sheet for Healthcare Providers: IncredibleEmployment.be  This test is not yet approved or cleared by the [place] FDA and has been authorized for detection and/or diagnosis of SARS-CoV-2 by FDA under an Emergency Use Authorization (EUA). This EUA will remain in effect (meaning this test can be used) for the duration of the COVID-19 declaration under Section 564(b)(1) of the Act, 21 U.S.C. section 360bbb-3(b)(1), unless the authorization is terminated or revoked.  Performed at [facility], [place] [address] [place], [place] [address]          Radiology Studies: No results found.      Scheduled Meds: . docusate sodium  100  mg Oral BID  . famotidine  20 mg Oral Daily  . hydrocortisone   Rectal BID  . insulin aspart  0-15 Units Subcutaneous TID WC  . insulin aspart  0-5 Units Subcutaneous QHS  . insulin aspart  3 Units Subcutaneous TID WC  . insulin glargine  7 Units Subcutaneous Daily  . levothyroxine  50 mcg Oral Q0600  . tamsulosin  0.4 mg Oral Daily   Continuous Infusions: . sodium chloride 250 mL (12/12/20 1648)  . cefTRIAXone (ROCEPHIN)  IV 1 g (12/12/20 7341)     Time spent: 26 minutes with over 50% of the time coordinating the patient's care    Harold Hedge, DO [facility]   Call night coverage person covering after 7pm

## 2020-12-13 DIAGNOSIS — I152 Hypertension secondary to endocrine disorders: Secondary | ICD-10-CM

## 2020-12-13 DIAGNOSIS — E1159 Type 2 diabetes mellitus with other circulatory complications: Secondary | ICD-10-CM

## 2020-12-13 LAB — BASIC METABOLIC PANEL
Anion gap: 8 (ref 5–15)
BUN: 15 mg/dL (ref 8–23)
CO2: 27 mmol/L (ref 22–32)
Calcium: 8.1 mg/dL — ABNORMAL LOW (ref 8.9–10.3)
Chloride: 100 mmol/L (ref 98–111)
Creatinine, Ser: 0.69 mg/dL (ref 0.44–1.00)
GFR, Estimated: >60 mL/min (ref >60)
Glucose, Bld: 131 mg/dL — ABNORMAL HIGH (ref 70–99)
Potassium: 4.2 mmol/L (ref 3.5–5.1)
Sodium: 135 mmol/L (ref 135–145)

## 2020-12-13 LAB — URINE CULTURE
Culture: 10000 — AB
Culture: NO GROWTH

## 2020-12-13 LAB — CBC
HCT: 28.5 % — ABNORMAL LOW (ref 36.0–46.0)
Hemoglobin: 9.3 g/dL — ABNORMAL LOW (ref 12.0–15.0)
MCH: 30.5 pg (ref 26.0–34.0)
MCHC: 32.6 g/dL (ref 30.0–36.0)
MCV: 93.4 fL (ref 80.0–100.0)
Platelets: 264 10*3/uL (ref 150–400)
RBC: 3.05 MIL/uL — ABNORMAL LOW (ref 3.87–5.11)
RDW: 17.6 % — ABNORMAL HIGH (ref 11.5–15.5)
WBC: 4.2 10*3/uL (ref 4.0–10.5)
nRBC: 0 % (ref 0.0–0.2)

## 2020-12-13 LAB — GLUCOSE, CAPILLARY
Glucose-Capillary: 175 mg/dL — ABNORMAL HIGH (ref 70–99)
Glucose-Capillary: 238 mg/dL — ABNORMAL HIGH (ref 70–99)
Glucose-Capillary: 321 mg/dL — ABNORMAL HIGH (ref 70–99)
Glucose-Capillary: 176 mg/dL — ABNORMAL HIGH (ref 70–99)

## 2020-12-13 MED ORDER — CARVEDILOL 3.125 MG PO TABS: 3.1250 mg | ORAL_TABLET | Freq: Two times a day (BID) | ORAL | Status: DC

## 2020-12-13 MED ORDER — SODIUM CHLORIDE 0.9 % IV BOLUS
500.0000 mL | Freq: Once | INTRAVENOUS | Status: AC
  Administered 2020-12-13: 500 mL via INTRAVENOUS

## 2020-12-13 MED ORDER — SENNA 8.6 MG PO TABS
1.0000 | ORAL_TABLET | Freq: Every day | ORAL | Status: DC
  Administered 2020-12-13 – 2020-12-14 (×2): 8.6 mg via ORAL
  Filled 2020-12-13 (×2): qty 1

## 2020-12-13 NOTE — TOC Progression Note (Signed)
Transition of Care [facility]) - Progression Note    Patient Details  Name: Iolanda Folson MRN: [id number] Date of Birth: 10-04-1930  Transition of [facility]) CM/SW Contact  Elliot Gurney [place], [place] Phone Number: [phone] 12/13/2020, 9:49 AM  Clinical Narrative:    Phone call to patient's daughter to present her with bed offers for [facility], [facility] and [facility]. Patient's daughter declined Geophysicist/field seismologist, she is willing to consider [facility] and [facility], however would really like patient to go to [facility]. Per patient's daughter, they do not have transportation and [facility] closer to their home. Phone call to [facility] to discuss referral sent and bed availability.  Voicemail message left requesting a return call.  Transition of Care to continue to follow patient for discharge needs.  [address], LCSW Transition of Care [phone]   Expected Discharge Plan: [place] Barriers to Discharge: Continued Medical Work up  Expected Discharge Plan and Services Expected Discharge Plan: [place] In-house Referral: Clinical Social Work     Living arrangements for the past 2 months: [place]: PT,OT [place]: [place] Date [place]: 12/12/20 Time [place]: 4920 Representative spoke with at [place]: [place] ([place]) Interventions    Readmission Risk Interventions Readmission Risk Prevention Plan 07/05/2020  Transportation Screening Complete  Home Care Screening Complete  Medication Review (RN CM) Complete  Some recent data might be hidden

## 2020-12-13 NOTE — Progress Notes (Signed)
Attempted to return [name] (daughter)'s phone call and no answer. Spoke with patient's sister to give an update.

## 2020-12-13 NOTE — Plan of Care (Signed)
  Problem: Pain Managment: Goal: General experience of comfort will improve Outcome: Progressing   

## 2020-12-13 NOTE — Progress Notes (Signed)
PROGRESS NOTE    Knute NeuCharlene Durnin    Code Status: Full Code  WGN:[id number] DOB: [birth year] DOA: 12/09/2020 LOS: 3 days  PCP: Joana ReamerMullis, Kiersten P, DO CC:  Chief Complaint  Patient presents with  . Hyperglycemia       Hospital Summary   This is an 87 year old female with a history of IDDM, hypothyroidism, hypertension, anemia, constipation who was admitted on 4/20 for acute delirium/metabolic encephalopathy thought to be secondary to a UTI. Her encephalopathy has since resolved but she has had a drop in her Hb from 10.2 on 4/12 -> 8.3 on 4/20 and 6.6 on 4/21 which required 1 u PRBCs. She has not had any overt GI bleeding and iron studies indicated acute phase reaction.   Patient completed 3 days of ceftriaxone for a UTI and had improvement in her blood sugars.  She was evaluated by PT who recommended Home health or SNF.  Patient and family initially opted for home health and when she was medically stable for discharge on 4/23 family changed their mind to SNF.  Discharge was canceled for 4/23.    A & P   Principal Problem:   Acute encephalopathy Active Problems:   Hypertension associated with diabetes ([facility])   Uncontrolled type 2 diabetes mellitus with hyperglycemia ([facility])   UTI (urinary tract infection)   Acute on chronic anemia   Hyperbilirubinemia   Hypothyroidism  1. Acute encephalopathy, likely metabolic and delirium secondary to UTI, hyponatremia and hyperglycemia, resolved  2. UTI, resolved a. Completed 3 days ceftriaxone b. Urine culture still pending as it was received at the lab late and still in process c. UA cloudy with sediment on exam 4/23 and noted multiple positive UAs in the past with yeast and Given fluconazole x1, repeat UA turbid, with glucosuria and moderate leukocytes  3. Acute on chronic normocytic anemia, suspect dilutional, resolved a. Hb 10.2 on 4/12 -> 8.3 on admit -> 6.6 -> received 1 unit PRBCs and now stable b. Iron studies indicate an acute phase  reaction, FOBT negative, LDH normal and haptoglobin negative c. Transfuse for Hb < 7.0 or symptomatic  4. Sinus tachycardia  hypertension a. Started on Coreg for rates and pressure but had borderline hypotension today and was given 500 cc NS bolus and Coreg discontinued.  Rates were better controlled.  Follow-up in a.m. and consider starting lower dose Coreg 3.125 mg twice daily  5. External hemorrhoids  Constipation, symptomatically resolved a. Anusol cream BID b. Tucks Pads c. Continue miralax and added on docusate this hospitalization.  We will try short course of senna  6. Insulin Dependent DM with hyperglycemia a. HbA1c 7.9 b. Continue Lantus 7 units, NovoLog increased to 5 units 3 times daily with meals, moderate and at bedtime sliding scale  7. Hypothyroidism a. Continue synthroid  8. Hyperlipidemia a. Continue statin  9. Rales suspect atelectasis a. Incentive spirometry    DVT prophylaxis: SCDs Start: 12/09/20 0616   Family Communication: daughter updated yesterday  Disposition Plan: Pending SNF availability Status is: Inpatient  Remains inpatient appropriate because:Unsafe d/c plan   Dispo: The patient is from: Home              Anticipated d/c is to: SNF              Patient currently is medically stable to d/c.   Difficult to place patient No                 Pressure injury documentation  None  Consultants  None  Procedures  4/21: 1 u PRBC  Antibiotics   Anti-infectives (From admission, onward)   Start     Dose/Rate Route Frequency Ordered Stop   12/12/20 1000  fluconazole (DIFLUCAN) tablet 100 mg        100 mg Oral  Once 12/12/20 0912 12/12/20 1104   12/09/20 0630  cefTRIAXone (ROCEPHIN) 1 g in sodium chloride 0.9 % 100 mL IVPB        1 g 200 mL/hr over 30 Minutes Intravenous Every 24 hours 12/09/20 2725          Subjective   She is frustrated she still hospitalized and is hoping that she can potentially go home but  understands that she will need support from her daughter who is unable to care for her currently and asking for SNF.  Patient is understanding at this time.  No overnight events or complaints other than constipation  Objective   Vitals:   12/12/20 2214 12/13/20 0714 12/13/20 0957 12/13/20 1349  BP: 122/80 114/65 (!) 97/47 99/66  Pulse: 98 100  84  Resp: 16 18  17   Temp: 99.6 F (37.6 C) 98.8 F (37.1 C)  98.3 F (36.8 C)  TempSrc: Oral Oral  Oral  SpO2: 100% 100%  98%  Weight:      Height:        Intake/Output Summary (Last 24 hours) at 12/13/2020 1436 Last data filed at 12/13/2020 0930 Gross per 24 hour  Intake 619.96 ml  Output 900 ml  Net -280.04 ml   Filed Weights   12/10/20 1157  Weight: 59.8 kg    Examination:  Physical Exam Vitals and nursing note reviewed.  Constitutional:      Appearance: Normal appearance.  HENT:     Head: Normocephalic and atraumatic.  Eyes:     Conjunctiva/sclera: Conjunctivae normal.  Cardiovascular:     Rate and Rhythm: Normal rate and regular rhythm.  Pulmonary:     Effort: Pulmonary effort is normal.     Breath sounds: Rales present.  Abdominal:     General: Abdomen is flat.     Palpations: Abdomen is soft.  Musculoskeletal:        General: No swelling or tenderness.  Skin:    Coloration: Skin is not jaundiced or pale.  Neurological:     Mental Status: She is alert. Mental status is at baseline.  Psychiatric:        Mood and Affect: Mood normal.        Behavior: Behavior normal.     Data Reviewed: I have personally reviewed following labs and imaging studies  CBC: Recent Labs  Lab 12/09/20 0315 12/10/20 0319 12/10/20 1430 12/10/20 2023 12/11/20 0307 12/13/20 0329  WBC 6.4 4.3  --   --  4.1 4.2  NEUTROABS  --  3.0  --   --   --   --   HGB 8.3* 6.6* 9.2* 9.5* 9.1* 9.3*  HCT 26.1* 21.0* 28.5* 29.6* 27.9* 28.5*  MCV 94.9 95.5  --   --  92.1 93.4  PLT 356 299  --   --  279 366   Basic Metabolic Panel: Recent  Labs  Lab 12/09/20 0315 12/09/20 1459 12/10/20 0319 12/11/20 0307 12/13/20 0329  NA 129* 135 139 134* 135  K 5.1 4.7 4.4 4.2 4.2  CL 94* 100 105 100 100  CO2 25 27 28 26 27   GLUCOSE 414* 306* 110* 189* 131*  BUN 30* 25* 23  21 15  CREATININE 0.85 0.88 0.83 0.74 0.69  CALCIUM 8.8* 8.4* 8.6* 8.2* 8.1*   GFR: Estimated Creatinine Clearance: 41.1 mL/min (by C-G formula based on SCr of 0.69 mg/dL). Liver Function Tests: Recent Labs  Lab 12/09/20 0315 12/10/20 0319  AST 19 47*  ALT 15 31  ALKPHOS 73 53  BILITOT 2.2* 1.7*  PROT 6.5 5.2*  ALBUMIN 3.3* 2.6*   No results for input(s): LIPASE, AMYLASE in the last 168 hours. No results for input(s): AMMONIA in the last 168 hours. Coagulation Profile: No results for input(s): INR, PROTIME in the last 168 hours. Cardiac Enzymes: No results for input(s): CKTOTAL, CKMB, CKMBINDEX, TROPONINI in the last 168 hours. BNP (last 3 results) No results for input(s): PROBNP in the last 8760 hours. HbA1C: No results for input(s): HGBA1C in the last 72 hours. CBG: Recent Labs  Lab 12/12/20 1149 12/12/20 1633 12/12/20 2215 12/13/20 0819 12/13/20 1158  GLUCAP 223* 209* 106* 175* 238*   Lipid Profile: No results for input(s): CHOL, HDL, LDLCALC, TRIG, CHOLHDL, LDLDIRECT in the last 72 hours. Thyroid Function Tests: No results for input(s): TSH, T4TOTAL, FREET4, T3FREE, THYROIDAB in the last 72 hours. Anemia Panel: No results for input(s): VITAMINB12, FOLATE, FERRITIN, TIBC, IRON, RETICCTPCT in the last 72 hours. Sepsis Labs: Recent Labs  Lab 12/09/20 0615  LATICACIDVEN 1.3    Recent Results (from the past 240 hour(s))  Resp Panel by RT-PCR (Flu A&B, Covid) Nasopharyngeal Swab     Status: None   Collection Time: 12/09/20  6:09 AM   Specimen: Nasopharyngeal Swab; Nasopharyngeal(NP) swabs in vial transport medium  Result Value Ref Range Status   SARS Coronavirus 2 by RT PCR NEGATIVE NEGATIVE Final    Comment: (NOTE) SARS-CoV-2  target nucleic acids are NOT DETECTED.  The SARS-CoV-2 RNA is generally detectable in upper respiratory specimens during the acute phase of infection. The lowest concentration of SARS-CoV-2 viral copies this assay can detect is 138 copies/mL. A negative result does not preclude SARS-Cov-2 infection and should not be used as the sole basis for treatment or other patient management decisions. A negative result may occur with  improper specimen collection/handling, submission of specimen other than nasopharyngeal swab, presence of viral mutation(s) within the areas targeted by this assay, and inadequate number of viral copies(<138 copies/mL). A negative result must be combined with clinical observations, patient history, and epidemiological information. The expected result is Negative.  Fact Sheet for Patients:  EntrepreneurPulse.com.au  Fact Sheet for Healthcare Providers:  IncredibleEmployment.be  This test is no t yet approved or cleared by the [place] FDA and  has been authorized for detection and/or diagnosis of SARS-CoV-2 by FDA under an Emergency Use Authorization (EUA). This EUA will remain  in effect (meaning this test can be used) for the duration of the COVID-19 declaration under Section 564(b)(1) of the Act, 21 U.S.C.section 360bbb-3(b)(1), unless the authorization is terminated  or revoked sooner.       Influenza A by PCR NEGATIVE NEGATIVE Final   Influenza B by PCR NEGATIVE NEGATIVE Final    Comment: (NOTE) The Xpert Xpress SARS-CoV-2/FLU/RSV plus assay is intended as an aid in the diagnosis of influenza from Nasopharyngeal swab specimens and should not be used as a sole basis for treatment. Nasal washings and aspirates are unacceptable for Xpert Xpress SARS-CoV-2/FLU/RSV testing.  Fact Sheet for Patients: EntrepreneurPulse.com.au  Fact Sheet for Healthcare  Providers: IncredibleEmployment.be  This test is not yet approved or cleared by the [place] FDA and has  been authorized for detection and/or diagnosis of SARS-CoV-2 by FDA under an Emergency Use Authorization (EUA). This EUA will remain in effect (meaning this test can be used) for the duration of the COVID-19 declaration under Section 564(b)(1) of the Act, 21 U.S.C. section 360bbb-3(b)(1), unless the authorization is terminated or revoked.  Performed at [facility], [place] [address], [place], [place] [address]   Urine Culture     Status: None (Preliminary result)   Collection Time: 12/09/20  6:09 AM   Specimen: Urine, Catheterized  Result Value Ref Range Status   Specimen Description   Final    URINE, CATHETERIZED Performed at [place] [address], [place], [place] [address]    Special Requests   Final    NONE Performed at [facility], [place] [address] [place], [place] [address]    Culture   Final    CULTURE REINCUBATED FOR BETTER GROWTH Performed at [facility], [place] [address] [place], [place] [address]    Report Status PENDING  Incomplete         Radiology Studies: No results found.      Scheduled Meds: . carvedilol  3.125 mg Oral BID WC  . docusate sodium  100 mg Oral BID  . famotidine  20 mg Oral Daily  . hydrocortisone   Rectal BID  . insulin aspart  0-15 Units Subcutaneous TID WC  . insulin aspart  0-5 Units Subcutaneous QHS  . insulin aspart  5 Units Subcutaneous TID WC  . insulin glargine  7 Units Subcutaneous Daily  . levothyroxine  50 mcg Oral Q0600  . senna  1 tablet Oral Daily  . tamsulosin  0.4 mg Oral Daily   Continuous Infusions: . sodium chloride 10 mL/hr at 12/12/20 1800  . cefTRIAXone (ROCEPHIN)  IV 1 g (12/13/20 5170)     Time spent: 25 minutes with over 50% of the time coordinating the patient's care    Harold Hedge, DO [facility]   Call night coverage person covering after 7pm

## 2020-12-14 DIAGNOSIS — K649 Unspecified hemorrhoids: Secondary | ICD-10-CM

## 2020-12-14 LAB — RESP PANEL BY RT-PCR (FLU A&B, COVID) ARPGX2
Influenza A by PCR: NEGATIVE
Influenza B by PCR: NEGATIVE
SARS Coronavirus 2 by RT PCR: NEGATIVE

## 2020-12-14 LAB — GLUCOSE, CAPILLARY
Glucose-Capillary: 239 mg/dL — ABNORMAL HIGH (ref 70–99)
Glucose-Capillary: 311 mg/dL — ABNORMAL HIGH (ref 70–99)
Glucose-Capillary: 400 mg/dL — ABNORMAL HIGH (ref 70–99)
Glucose-Capillary: 239 mg/dL — ABNORMAL HIGH (ref 70–99)

## 2020-12-14 MED ORDER — POLYETHYLENE GLYCOL 3350 17 G PO PACK
17.0000 g | PACK | Freq: Every day | ORAL | Status: DC
  Administered 2020-12-15: 17 g via ORAL
  Filled 2020-12-14: qty 1

## 2020-12-14 MED ORDER — INSULIN GLARGINE 100 UNIT/ML ~~LOC~~ SOLN
12.0000 [IU] | Freq: Every day | SUBCUTANEOUS | Status: DC
  Administered 2020-12-15: 12 [IU] via SUBCUTANEOUS
  Filled 2020-12-14: qty 0.12

## 2020-12-14 NOTE — Progress Notes (Signed)
Physical Therapy Treatment Patient Details Name: Hayley Fisher MRN: [id number] DOB: 1930-10-05 Today's Date: 12/14/2020    History of Present Illness Patient is 87 y.o. female admitted on 12/09/20 for acute delirium/metabolic encephalopathy thought to be secondary to a UTI. PMH significant for IDDM, hypothyroidism, HTN, anemia, constipation.    PT Comments    Patient continues to require min assist for safety with transfers and gait. She is heavily reliant on external support of RW and therapist to perform dynamic gait and static standing activities for self care and dressing. Repeated cues needed for safe use of RW during gait as pt has flexed posture and poor safety awareness for walker position. EOS pt completed seated LE exercises for strengthening and encouraged to perform throughout the day while sitting up. Acute PT will continue to progress pt as able.   Follow Up Recommendations  SNF     Equipment Recommendations  None recommended by PT    Recommendations for Other Services OT consult     Precautions / Restrictions Precautions Precautions: Fall Restrictions Weight Bearing Restrictions: No    Mobility  Bed Mobility               General bed mobility comments: pt OOB in recliner    Transfers Overall transfer level: Needs assistance Equipment used: Rolling walker (2 wheeled) Transfers: Sit to/from Stand Sit to Stand: Min assist         General transfer comment: cues for hand placement with power up and assist for rise and safe reach back to recliner.  Ambulation/Gait Ambulation/Gait assistance: Min assist Gait Distance (Feet): 30 Feet Assistive device: Rolling walker (2 wheeled) Gait Pattern/deviations: Step-through pattern;Decreased stride length;Trunk flexed;Shuffle Gait velocity: decr   General Gait Details: Cues for safe proximity to RW as pt tends to keep walker too far ahead of self. VC's for posture as pt tends to remain in flexed position. pt  ambulated short distance in hallway and back to .room   Stairs             Wheelchair Mobility    Modified Rankin (Stroke Patients Only)       Balance Overall balance assessment: Needs assistance Sitting-balance support: Feet supported;Bilateral upper extremity supported Sitting balance-Leahy Scale: Fair Sitting balance - Comments: pt required min gaurd/assist to steady while leaning forward to start donning breif in sitting.   Standing balance support: During functional activity;Bilateral upper extremity supported Standing balance-Leahy Scale: Poor Standing balance comment: pt reliant on external support of counter to perform pericare and use of RW and min assist from therapist to steady self while donning/pulling up new brief.                            Cognition Arousal/Alertness: Awake/alert Behavior During Therapy: WFL for tasks assessed/performed Overall Cognitive Status: No family/caregiver present to determine baseline cognitive functioning                                 General Comments: pt is pleasant and A&O to self, place, time, and somewhat situation..      Exercises General Exercises - Lower Extremity Ankle Circles/Pumps: AROM;Both;10 reps;Seated Long Arc Quad: AROM;Both;10 reps;Seated    General Comments        Pertinent Vitals/Pain Pain Assessment: No/denies pain    Home Living  Prior Function            PT Goals (current goals can now be found in the care plan section) Acute Rehab PT Goals Patient Stated Goal: go home PT Goal Formulation: With patient Time For Goal Achievement: 12/24/20 Potential to Achieve Goals: Good Progress towards PT goals: Progressing toward goals    Frequency    Min 3X/week      PT Plan Current plan remains appropriate    Co-evaluation              AM-PAC PT "6 Clicks" Mobility   Outcome Measure  Help needed turning from your back to your  side while in a flat bed without using bedrails?: A Little Help needed moving from lying on your back to sitting on the side of a flat bed without using bedrails?: A Little Help needed moving to and from a bed to a chair (including a wheelchair)?: A Little Help needed standing up from a chair using your arms (e.g., wheelchair or bedside chair)?: A Little Help needed to walk in hospital room?: A Little Help needed climbing 3-5 steps with a railing? : A Lot 6 Click Score: 17    End of Session Equipment Utilized During Treatment: Gait belt Activity Tolerance: Patient tolerated treatment well Patient left: in chair;with call bell/phone within reach;with chair alarm set;with family/visitor present Nurse Communication: Mobility status PT Visit Diagnosis: Muscle weakness (generalized) (M62.81);Difficulty in walking, not elsewhere classified (R26.2);Unsteadiness on feet (R26.81)     Time: 0867-6195 PT Time Calculation (min) (ACUTE ONLY): 26 min  Charges:  $Gait Training: 8-22 mins $Therapeutic Activity: 8-22 mins                     Hayley Fisher, DPT Acute Rehabilitation Services Office [phone] Pager [phone]     Hayley Fisher 12/14/2020, 1:32 PM

## 2020-12-14 NOTE — TOC Progression Note (Signed)
Transition of Care [facility] - Progression Note    Patient Details  Name: Hayley Fisher MRN: [id number] Date of Birth: 07-Feb-1931  Transition of [facility]) CM/SW Contact  Lennart Pall, LCSW Phone Number: 12/14/2020, 12:46 PM  Clinical Narrative:    Have presented SNF bed offers to daughter and she is researching and to let me know choice this afternoon.  Still await insurance auth.  Hope for [employer] tomorrow.  COVID test negative.   Expected Discharge Plan: [place] Barriers to Discharge: Continued Medical Work up  Expected Discharge Plan and Services Expected Discharge Plan: [place] In-house Referral: Clinical Social Work     Living arrangements for the past 2 months: [place]: PT,OT [place]: [place] Date [place]: 12/12/20 Time [place]: [id number] Representative spoke with at [place]: [place] ([place]) Interventions    Readmission Risk Interventions Readmission Risk Prevention Plan 07/05/2020  Transportation Screening Complete  Home Care Screening Complete  Medication Review (RN CM) Complete  Some recent data might be hidden

## 2020-12-14 NOTE — Plan of Care (Signed)
  Problem: Nutrition: Goal: Adequate nutrition will be maintained Outcome: Progressing   Problem: Elimination: Goal: Will not experience complications related to bowel motility Outcome: Progressing   Problem: Pain Managment: Goal: General experience of comfort will improve Outcome: Progressing   Problem: Safety: Goal: Ability to remain free from injury will improve Outcome: Progressing   

## 2020-12-14 NOTE — Care Management Important Message (Signed)
Important Message  Patient Details IM Letter given to the Patient. Name: Hayley Fisher MRN: [id number] Date of Birth: [birth year]   Medicare Important Message Given:  Yes     Kerin Salen 12/14/2020, 12:41 PM

## 2020-12-14 NOTE — Progress Notes (Signed)
PROGRESS NOTE    Hayley Fisher    Code Status: Full Code  [id number] DOB: 12-10-1930 DOA: 12/09/2020 LOS: 4 days  PCP: Danna Hefty, DO CC:  Chief Complaint  Patient presents with  . Hyperglycemia       Hospital Summary   This is an 87 year old female with a history of IDDM, hypothyroidism, hypertension, anemia, constipation who was admitted on 4/20 for acute delirium/metabolic encephalopathy thought to be secondary to a UTI and possibly from hyperglycemia (414). Her encephalopathy has since resolved after treatment for her UTI and hyperglycemia but she has had a drop in her Hb from 10.2 on 4/12 -> 8.3 on 4/20 and 6.6 on 4/21 which required 1 u PRBCs. She has not had any overt GI bleeding and iron studies indicated acute phase reaction.   Patient completed ceftriaxone for a UTI and had improvement in her blood sugars. She was also given fluconazole x1 for yeast.  She was evaluated by PT who recommended Home health or SNF.  Patient and family initially opted for home health and when she was medically stable for discharge on 4/23 family changed their mind to SNF.  Discharge was canceled for 4/23. Currently waiting on SNF availability    A & P   Principal Problem:   Acute encephalopathy Active Problems:   Hypertension associated with diabetes ([place])   Uncontrolled type 2 diabetes mellitus with hyperglycemia ([facility])   UTI (urinary tract infection)   Acute on chronic anemia   Hyperbilirubinemia   Hypothyroidism  1. Acute encephalopathy, likely metabolic and delirium secondary to UTI  and hyperglycemia, resolved  2. UTI, resolved a. UA borderline positive with pyuria b. Completed ceftriaxone and fluconazole x1 c. Urine culture with yeast and no bacteria  3. Acute on chronic normocytic anemia, suspect dilutional, resolved a. Hb 10.2 on 4/12 -> 8.3 on admit -> 6.6 -> received 1 unit PRBCs and now stable b. Iron studies indicate an acute phase reaction, FOBT negative, LDH  normal and haptoglobin negative c. Transfuse for Hb < 7.0 or symptomatic  4. Sinus tachycardia  hypertension, stable  5. External hemorrhoids  Constipation, symptomatically resolved a. Had a BM yesterday b. Hemorrhoids are stable today and non tender c. No need for surgical consult at this time d. Anusol cream BID e. Tucks Pads f. Change miralax to daily and continue colace BID g. Discontinue senna for now and try to stay away from bowel stimulants  6. Insulin Dependent DM with hyperglycemia a. HbA1c 7.9 b. Continue Lantus 7 units, NovoLog increased to 5 units 3 times daily with meals, moderate and at bedtime sliding scale  7. Hypothyroidism a. Continue synthroid  8. Hyperlipidemia a. Continue statin  9. Rales suspect atelectasis, improved a. Incentive spirometry    DVT prophylaxis: SCDs Start: 12/09/20 0616   Family Communication: daughter updated today  Disposition Plan: Pending SNF availability Status is: Inpatient  Remains inpatient appropriate because:Unsafe d/c plan   Dispo: The patient is from: Home              Anticipated d/c is to: SNF              Patient currently is medically stable to d/c.   Difficult to place patient No                 Pressure injury documentation    None  Consultants  None  Procedures  4/21: 1 u PRBC  Antibiotics   Anti-infectives (From admission, onward)  Start     Dose/Rate Route Frequency Ordered Stop   12/12/20 1000  fluconazole (DIFLUCAN) tablet 100 mg        100 mg Oral  Once 12/12/20 0912 12/12/20 1104   12/09/20 0630  cefTRIAXone (ROCEPHIN) 1 g in sodium chloride 0.9 % 100 mL IVPB  Status:  Discontinued        1 g 200 mL/hr over 30 Minutes Intravenous Every 24 hours 12/09/20 [id number] 12/14/20 1342        Subjective   Doing well today and no complaints. Says her hemorrhoids are feeling better but had one firm BM last night followed by a softer one afterwards. No other issues or overnight  events.  Objective   Vitals:   12/13/20 1700 12/13/20 2200 12/14/20 0530 12/14/20 1339  BP: 104/72 121/66 136/73 122/66  Pulse:  91 90 (!) 101  Resp:  16 16 18   Temp:  98.8 F (37.1 C) 98.2 F (36.8 C) 98.6 F (37 C)  TempSrc:   Oral Oral  SpO2:  100% 100% 100%  Weight:      Height:        Intake/Output Summary (Last 24 hours) at 12/14/2020 1437 Last data filed at 12/14/2020 1300 Gross per 24 hour  Intake 1460 ml  Output 1800 ml  Net -340 ml   Filed Weights   12/10/20 1157  Weight: 59.8 kg    Examination:  Physical Exam Vitals and nursing note reviewed. Exam conducted with a chaperone present.  Constitutional:      Appearance: Normal appearance.  HENT:     Head: Normocephalic and atraumatic.  Eyes:     Conjunctiva/sclera: Conjunctivae normal.  Cardiovascular:     Rate and Rhythm: Normal rate and regular rhythm.  Pulmonary:     Effort: Pulmonary effort is normal.     Breath sounds: Normal breath sounds.  Abdominal:     General: Abdomen is flat.     Palpations: Abdomen is soft.  Genitourinary:    Comments: External hemorrhoids, nontender not erythematous Musculoskeletal:        General: No swelling or tenderness.  Skin:    Coloration: Skin is not jaundiced or pale.  Neurological:     Mental Status: She is alert. Mental status is at baseline.  Psychiatric:        Mood and Affect: Mood normal.        Behavior: Behavior normal.        Data Reviewed: I have personally reviewed following labs and imaging studies  CBC: Recent Labs  Lab 12/09/20 0315 12/10/20 0319 12/10/20 1430 12/10/20 2023 12/11/20 0307 12/13/20 0329  WBC 6.4 4.3  --   --  4.1 4.2  NEUTROABS  --  3.0  --   --   --   --   HGB 8.3* 6.6* 9.2* 9.5* 9.1* 9.3*  HCT 26.1* 21.0* 28.5* 29.6* 27.9* 28.5*  MCV 94.9 95.5  --   --  92.1 93.4  PLT 356 299  --   --  279 [id number]   Basic Metabolic Panel: Recent Labs  Lab 12/09/20 0315 12/09/20 1459 12/10/20 0319 12/11/20 0307 12/13/20 0329   NA 129* 135 139 134* 135  K 5.1 4.7 4.4 4.2 4.2  CL 94* 100 105 100 100  CO2 25 27 28 26 27   GLUCOSE 414* 306* 110* 189* 131*  BUN 30* 25* 23 21 15   CREATININE 0.85 0.88 0.83 0.74 0.69  CALCIUM 8.8* 8.4* 8.6* 8.2* 8.1*   GFR: Estimated  Creatinine Clearance: 41.1 mL/min (by C-G formula based on SCr of 0.69 mg/dL). Liver Function Tests: Recent Labs  Lab 12/09/20 0315 12/10/20 0319  AST 19 47*  ALT 15 31  ALKPHOS 73 53  BILITOT 2.2* 1.7*  PROT 6.5 5.2*  ALBUMIN 3.3* 2.6*   No results for input(s): LIPASE, AMYLASE in the last 168 hours. No results for input(s): AMMONIA in the last 168 hours. Coagulation Profile: No results for input(s): INR, PROTIME in the last 168 hours. Cardiac Enzymes: No results for input(s): CKTOTAL, CKMB, CKMBINDEX, TROPONINI in the last 168 hours. BNP (last 3 results) No results for input(s): PROBNP in the last 8760 hours. HbA1C: No results for input(s): HGBA1C in the last 72 hours. CBG: Recent Labs  Lab 12/13/20 1158 12/13/20 1649 12/13/20 2201 12/14/20 0737 12/14/20 1108  GLUCAP 238* 321* 176* 239* 311*   Lipid Profile: No results for input(s): CHOL, HDL, LDLCALC, TRIG, CHOLHDL, LDLDIRECT in the last 72 hours. Thyroid Function Tests: No results for input(s): TSH, T4TOTAL, FREET4, T3FREE, THYROIDAB in the last 72 hours. Anemia Panel: No results for input(s): VITAMINB12, FOLATE, FERRITIN, TIBC, IRON, RETICCTPCT in the last 72 hours. Sepsis Labs: Recent Labs  Lab 12/09/20 0615  LATICACIDVEN 1.3    Recent Results (from the past 240 hour(s))  Resp Panel by RT-PCR (Flu A&B, Covid) Nasopharyngeal Swab     Status: None   Collection Time: 12/09/20  6:09 AM   Specimen: Nasopharyngeal Swab; Nasopharyngeal(NP) swabs in vial transport medium  Result Value Ref Range Status   SARS Coronavirus 2 by RT PCR NEGATIVE NEGATIVE Final    Comment: (NOTE) SARS-CoV-2 target nucleic acids are NOT DETECTED.  The SARS-CoV-2 RNA is generally detectable in  upper respiratory specimens during the acute phase of infection. The lowest concentration of SARS-CoV-2 viral copies this assay can detect is 138 copies/mL. A negative result does not preclude SARS-Cov-2 infection and should not be used as the sole basis for treatment or other patient management decisions. A negative result may occur with  improper specimen collection/handling, submission of specimen other than nasopharyngeal swab, presence of viral mutation(s) within the areas targeted by this assay, and inadequate number of viral copies(<138 copies/mL). A negative result must be combined with clinical observations, patient history, and epidemiological information. The expected result is Negative.  Fact Sheet for Patients:  EntrepreneurPulse.com.au  Fact Sheet for Healthcare Providers:  IncredibleEmployment.be  This test is no t yet approved or cleared by the [place] FDA and  has been authorized for detection and/or diagnosis of SARS-CoV-2 by FDA under an Emergency Use Authorization (EUA). This EUA will remain  in effect (meaning this test can be used) for the duration of the COVID-19 declaration under Section 564(b)(1) of the Act, 21 U.S.C.section 360bbb-3(b)(1), unless the authorization is terminated  or revoked sooner.       Influenza A by PCR NEGATIVE NEGATIVE Final   Influenza B by PCR NEGATIVE NEGATIVE Final    Comment: (NOTE) The Xpert Xpress SARS-CoV-2/FLU/RSV plus assay is intended as an aid in the diagnosis of influenza from Nasopharyngeal swab specimens and should not be used as a sole basis for treatment. Nasal washings and aspirates are unacceptable for Xpert Xpress SARS-CoV-2/FLU/RSV testing.  Fact Sheet for Patients: EntrepreneurPulse.com.au  Fact Sheet for Healthcare Providers: IncredibleEmployment.be  This test is not yet approved or cleared by the [place] FDA and has been  authorized for detection and/or diagnosis of SARS-CoV-2 by FDA under an Emergency Use Authorization (EUA). This EUA will remain  in effect (meaning this test can be used) for the duration of the COVID-19 declaration under Section 564(b)(1) of the Act, 21 U.S.C. section 360bbb-3(b)(1), unless the authorization is terminated or revoked.  Performed at [facility], [place] [address], [place], [place] [address]   Urine Culture     Status: Abnormal   Collection Time: 12/09/20  6:09 AM   Specimen: Urine, Catheterized  Result Value Ref Range Status   Specimen Description   Final    URINE, CATHETERIZED Performed at [place] [address] [place], [place] [address]    Special Requests   Final    NONE Performed at [facility], [place] [address] [place], [place] [address]    Culture 10,000 COLONIES/mL YEAST (A)  Final   Report Status 12/13/2020 FINAL  Final  Culture, Urine     Status: None   Collection Time: 12/12/20  1:54 PM   Specimen: Urine, Clean Catch  Result Value Ref Range Status   Specimen Description   Final    URINE, CLEAN CATCH Performed at [facility], [place] [address] [place], [place] [address]    Special Requests   Final    NONE Performed at [facility], [place] [address], [place], [place] [address]    Culture   Final    NO GROWTH Performed at [facility], [place] [address] [place],  [address]    Report Status 12/13/2020 FINAL  Final  Resp Panel by RT-PCR (Flu A&B, Covid) Nasopharyngeal Swab     Status: None   Collection Time: 12/14/20 11:16 AM   Specimen: Nasopharyngeal Swab; Nasopharyngeal(NP) swabs in vial transport medium  Result Value Ref Range Status   SARS Coronavirus 2 by RT PCR NEGATIVE NEGATIVE Final    Comment: (NOTE) SARS-CoV-2 target nucleic acids are NOT DETECTED.  The SARS-CoV-2 RNA is generally detectable in upper respiratory specimens during  the acute phase of infection. The lowest concentration of SARS-CoV-2 viral copies this assay can detect is 138 copies/mL. A negative result does not preclude SARS-Cov-2 infection and should not be used as the sole basis for treatment or other patient management decisions. A negative result may occur with  improper specimen collection/handling, submission of specimen other than nasopharyngeal swab, presence of viral mutation(s) within the areas targeted by this assay, and inadequate number of viral copies(<138 copies/mL). A negative result must be combined with clinical observations, patient history, and epidemiological information. The expected result is Negative.  Fact Sheet for Patients:  EntrepreneurPulse.com.au  Fact Sheet for Healthcare Providers:  IncredibleEmployment.be  This test is no t yet approved or cleared by the [place] FDA and  has been authorized for detection and/or diagnosis of SARS-CoV-2 by FDA under an Emergency Use Authorization (EUA). This EUA will remain  in effect (meaning this test can be used) for the duration of the COVID-19 declaration under Section 564(b)(1) of the Act, 21 U.S.C.section 360bbb-3(b)(1), unless the authorization is terminated  or revoked sooner.       Influenza A by PCR NEGATIVE NEGATIVE Final   Influenza B by PCR NEGATIVE NEGATIVE Final    Comment: (NOTE) The Xpert Xpress SARS-CoV-2/FLU/RSV plus assay is intended as an aid in the diagnosis of influenza from Nasopharyngeal swab specimens and should not be used as a sole basis for treatment. Nasal washings and aspirates are unacceptable for Xpert Xpress SARS-CoV-2/FLU/RSV testing.  Fact Sheet for Patients: EntrepreneurPulse.com.au  Fact Sheet for Healthcare Providers: IncredibleEmployment.be  This test is not yet approved or cleared by the [place] and has been authorized for detection and/or  diagnosis of SARS-CoV-2 by FDA under an Emergency Use Authorization (EUA). This EUA will remain in effect (meaning this test can be used) for the duration of the COVID-19 declaration under Section 564(b)(1) of the Act, 21 U.S.C. section 360bbb-3(b)(1), unless the authorization is terminated or revoked.  Performed at [facility], [place] [address] [place], [place] [address]          Radiology Studies: No results found.      Scheduled Meds: . docusate sodium  100 mg Oral BID  . famotidine  20 mg Oral Daily  . hydrocortisone   Rectal BID  . insulin aspart  0-15 Units Subcutaneous TID WC  . insulin aspart  0-5 Units Subcutaneous QHS  . insulin aspart  5 Units Subcutaneous TID WC  . insulin glargine  7 Units Subcutaneous Daily  . levothyroxine  50 mcg Oral Q0600  . senna  1 tablet Oral Daily  . tamsulosin  0.4 mg Oral Daily   Continuous Infusions: . sodium chloride 250 mL (12/14/20 0645)     Time spent: 26 minutes with over 50% of the time coordinating the patient's care    Harold Hedge, DO [facility]   Call night coverage person covering after 7pm

## 2020-12-15 ENCOUNTER — Telehealth: Payer: Self-pay | Admitting: Family Medicine

## 2020-12-15 DIAGNOSIS — E782 Mixed hyperlipidemia: Secondary | ICD-10-CM | POA: Diagnosis not present

## 2020-12-15 DIAGNOSIS — D638 Anemia in other chronic diseases classified elsewhere: Secondary | ICD-10-CM | POA: Diagnosis not present

## 2020-12-15 DIAGNOSIS — R739 Hyperglycemia, unspecified: Secondary | ICD-10-CM | POA: Diagnosis not present

## 2020-12-15 DIAGNOSIS — R6889 Other general symptoms and signs: Secondary | ICD-10-CM | POA: Diagnosis not present

## 2020-12-15 DIAGNOSIS — E118 Type 2 diabetes mellitus with unspecified complications: Secondary | ICD-10-CM | POA: Diagnosis not present

## 2020-12-15 DIAGNOSIS — K649 Unspecified hemorrhoids: Secondary | ICD-10-CM | POA: Diagnosis not present

## 2020-12-15 DIAGNOSIS — E785 Hyperlipidemia, unspecified: Secondary | ICD-10-CM | POA: Diagnosis not present

## 2020-12-15 DIAGNOSIS — I959 Hypotension, unspecified: Secondary | ICD-10-CM | POA: Diagnosis not present

## 2020-12-15 DIAGNOSIS — R3914 Feeling of incomplete bladder emptying: Secondary | ICD-10-CM | POA: Diagnosis not present

## 2020-12-15 DIAGNOSIS — G9340 Encephalopathy, unspecified: Secondary | ICD-10-CM | POA: Diagnosis not present

## 2020-12-15 DIAGNOSIS — I10 Essential (primary) hypertension: Secondary | ICD-10-CM | POA: Diagnosis not present

## 2020-12-15 DIAGNOSIS — L98421 Non-pressure chronic ulcer of back limited to breakdown of skin: Secondary | ICD-10-CM | POA: Diagnosis not present

## 2020-12-15 DIAGNOSIS — J9811 Atelectasis: Secondary | ICD-10-CM | POA: Diagnosis not present

## 2020-12-15 DIAGNOSIS — E039 Hypothyroidism, unspecified: Secondary | ICD-10-CM | POA: Diagnosis not present

## 2020-12-15 DIAGNOSIS — R319 Hematuria, unspecified: Secondary | ICD-10-CM | POA: Diagnosis not present

## 2020-12-15 DIAGNOSIS — R488 Other symbolic dysfunctions: Secondary | ICD-10-CM | POA: Diagnosis not present

## 2020-12-15 DIAGNOSIS — F32A Depression, unspecified: Secondary | ICD-10-CM | POA: Diagnosis not present

## 2020-12-15 DIAGNOSIS — R4182 Altered mental status, unspecified: Secondary | ICD-10-CM | POA: Diagnosis present

## 2020-12-15 DIAGNOSIS — E1165 Type 2 diabetes mellitus with hyperglycemia: Secondary | ICD-10-CM | POA: Diagnosis not present

## 2020-12-15 DIAGNOSIS — R609 Edema, unspecified: Secondary | ICD-10-CM | POA: Diagnosis not present

## 2020-12-15 DIAGNOSIS — R5381 Other malaise: Secondary | ICD-10-CM | POA: Diagnosis not present

## 2020-12-15 DIAGNOSIS — E440 Moderate protein-calorie malnutrition: Secondary | ICD-10-CM | POA: Diagnosis not present

## 2020-12-15 DIAGNOSIS — R339 Retention of urine, unspecified: Secondary | ICD-10-CM | POA: Diagnosis not present

## 2020-12-15 DIAGNOSIS — D649 Anemia, unspecified: Secondary | ICD-10-CM | POA: Diagnosis not present

## 2020-12-15 DIAGNOSIS — R6520 Severe sepsis without septic shock: Secondary | ICD-10-CM | POA: Diagnosis not present

## 2020-12-15 DIAGNOSIS — R2681 Unsteadiness on feet: Secondary | ICD-10-CM | POA: Diagnosis not present

## 2020-12-15 DIAGNOSIS — A419 Sepsis, unspecified organism: Secondary | ICD-10-CM | POA: Diagnosis not present

## 2020-12-15 DIAGNOSIS — R279 Unspecified lack of coordination: Secondary | ICD-10-CM | POA: Diagnosis not present

## 2020-12-15 DIAGNOSIS — K5900 Constipation, unspecified: Secondary | ICD-10-CM | POA: Diagnosis not present

## 2020-12-15 DIAGNOSIS — R531 Weakness: Secondary | ICD-10-CM | POA: Diagnosis not present

## 2020-12-15 DIAGNOSIS — E1169 Type 2 diabetes mellitus with other specified complication: Secondary | ICD-10-CM | POA: Diagnosis not present

## 2020-12-15 DIAGNOSIS — R404 Transient alteration of awareness: Secondary | ICD-10-CM | POA: Diagnosis not present

## 2020-12-15 DIAGNOSIS — I82412 Acute embolism and thrombosis of left femoral vein: Secondary | ICD-10-CM | POA: Diagnosis not present

## 2020-12-15 DIAGNOSIS — Z20822 Contact with and (suspected) exposure to covid-19: Secondary | ICD-10-CM | POA: Diagnosis not present

## 2020-12-15 DIAGNOSIS — Z515 Encounter for palliative care: Secondary | ICD-10-CM | POA: Diagnosis not present

## 2020-12-15 DIAGNOSIS — I152 Hypertension secondary to endocrine disorders: Secondary | ICD-10-CM | POA: Diagnosis not present

## 2020-12-15 DIAGNOSIS — N3020 Other chronic cystitis without hematuria: Secondary | ICD-10-CM | POA: Diagnosis not present

## 2020-12-15 DIAGNOSIS — N390 Urinary tract infection, site not specified: Secondary | ICD-10-CM | POA: Diagnosis not present

## 2020-12-15 DIAGNOSIS — Z7189 Other specified counseling: Secondary | ICD-10-CM | POA: Diagnosis not present

## 2020-12-15 DIAGNOSIS — L89151 Pressure ulcer of sacral region, stage 1: Secondary | ICD-10-CM | POA: Diagnosis not present

## 2020-12-15 DIAGNOSIS — I82403 Acute embolism and thrombosis of unspecified deep veins of lower extremity, bilateral: Secondary | ICD-10-CM | POA: Diagnosis not present

## 2020-12-15 DIAGNOSIS — Z743 Need for continuous supervision: Secondary | ICD-10-CM | POA: Diagnosis not present

## 2020-12-15 DIAGNOSIS — F0390 Unspecified dementia without behavioral disturbance: Secondary | ICD-10-CM | POA: Diagnosis present

## 2020-12-15 DIAGNOSIS — R410 Disorientation, unspecified: Secondary | ICD-10-CM | POA: Diagnosis not present

## 2020-12-15 DIAGNOSIS — M6281 Muscle weakness (generalized): Secondary | ICD-10-CM | POA: Diagnosis not present

## 2020-12-15 DIAGNOSIS — R278 Other lack of coordination: Secondary | ICD-10-CM | POA: Diagnosis not present

## 2020-12-15 DIAGNOSIS — E11649 Type 2 diabetes mellitus with hypoglycemia without coma: Secondary | ICD-10-CM | POA: Diagnosis not present

## 2020-12-15 DIAGNOSIS — I129 Hypertensive chronic kidney disease with stage 1 through stage 4 chronic kidney disease, or unspecified chronic kidney disease: Secondary | ICD-10-CM | POA: Diagnosis not present

## 2020-12-15 DIAGNOSIS — G9341 Metabolic encephalopathy: Secondary | ICD-10-CM | POA: Diagnosis not present

## 2020-12-15 DIAGNOSIS — R8271 Bacteriuria: Secondary | ICD-10-CM | POA: Diagnosis not present

## 2020-12-15 DIAGNOSIS — G928 Other toxic encephalopathy: Secondary | ICD-10-CM | POA: Diagnosis not present

## 2020-12-15 DIAGNOSIS — E1122 Type 2 diabetes mellitus with diabetic chronic kidney disease: Secondary | ICD-10-CM | POA: Diagnosis not present

## 2020-12-15 DIAGNOSIS — E1159 Type 2 diabetes mellitus with other circulatory complications: Secondary | ICD-10-CM | POA: Diagnosis not present

## 2020-12-15 DIAGNOSIS — N179 Acute kidney failure, unspecified: Secondary | ICD-10-CM | POA: Diagnosis not present

## 2020-12-15 DIAGNOSIS — N189 Chronic kidney disease, unspecified: Secondary | ICD-10-CM | POA: Diagnosis not present

## 2020-12-15 DIAGNOSIS — R627 Adult failure to thrive: Secondary | ICD-10-CM | POA: Diagnosis not present

## 2020-12-15 DIAGNOSIS — R413 Other amnesia: Secondary | ICD-10-CM | POA: Diagnosis not present

## 2020-12-15 DIAGNOSIS — I739 Peripheral vascular disease, unspecified: Secondary | ICD-10-CM | POA: Diagnosis not present

## 2020-12-15 LAB — GLUCOSE, CAPILLARY
Glucose-Capillary: 140 mg/dL — ABNORMAL HIGH (ref 70–99)
Glucose-Capillary: 238 mg/dL — ABNORMAL HIGH (ref 70–99)

## 2020-12-15 MED ORDER — WITCH HAZEL-GLYCERIN EX PADS: MEDICATED_PAD | CUTANEOUS | 12 refills | Status: DC | PRN

## 2020-12-15 MED ORDER — POLYETHYLENE GLYCOL 3350 17 GM/SCOOP PO POWD: 17.0000 g | Freq: Every day | ORAL | 0 refills | Status: DC

## 2020-12-15 MED ORDER — DOCUSATE SODIUM 100 MG PO CAPS
100.0000 mg | ORAL_CAPSULE | Freq: Two times a day (BID) | ORAL | 0 refills | Status: DC
Start: 2020-12-15 — End: 2021-08-09

## 2020-12-15 MED ORDER — INSULIN GLARGINE 100 UNIT/ML ~~LOC~~ SOLN: 15.0000 [IU] | Freq: Every day | SUBCUTANEOUS | 11 refills | Status: DC

## 2020-12-15 NOTE — Discharge Summary (Signed)
Physician Discharge Summary  Hayley Fisher [id number] DOB: 1930-11-09   PCP: Danna Hefty, DO  Admit date: 12/09/2020 Discharge date: 12/15/2020 Length of Stay: 5 days   Code Status: Full Code  Admitted From:  Home Discharged to:  SNF Discharge Condition:  Stable  Recommendations for Outpatient Follow-up   1. Follow up with PCP after Dc from SNF to discuss diabetes 2. Recommend against routine use of stimulant laxatives: senna, dulcolax and use stool softeners, stiz baths, anusol, tucks pads, etc. For hemorrhoids  Hospital Summary   This is an 87 year old female with a history of IDDM, hypothyroidism, hypertension, anemia, constipation who was admitted on 4/20 for acute delirium/metabolic encephalopathy thought to be secondary to a UTI and possibly from hyperglycemia (414). Her encephalopathy has since resolved after treatment for her UTI and hyperglycemia but she has had a drop in her Hb from 10.2 on 4/12 -> 8.3 on 4/20 and 6.6 on 4/21 which required 1 u PRBCs, suspected to be dilutional. She didn't have any overt GI bleeding and iron studies indicated acute phase reaction.   Patient completed empiric ceftriaxone for a UTI and several days later a dose of empiric fluconazole prior to her urine culture resulting which eventually ended up only growing yeast after she completed treatment.  She had improvement in her blood sugars. She was evaluated by PT who recommended Home health or SNF.  Patient and family initially opted for home health and when she was medically stable for discharge on 4/23 family changed their mind to SNF which prolonged her hospitalization. She was discharged in stable condition with increased Lantus to 15 u daily.   She also complained of rectal pain during her hospitalization due to external hemorrhoids which were not thrombosed. She had improved symptoms after anusol cream and tucks pads and BID colace and QD miralax and PRN senna   A & P   Principal  Problem:   Acute encephalopathy Active Problems:   Hypertension associated with diabetes ([place])   Uncontrolled type 2 diabetes mellitus with hyperglycemia ([facility])   UTI (urinary tract infection)   Acute on chronic anemia   Hyperbilirubinemia   Hypothyroidism    Acute encephalopathy, likely metabolic and delirium secondary to UTI  and hyperglycemia, resolved  UTI, resolved a. UA borderline positive with pyuria b. Completed ceftriaxone and fluconazole x1 c. Urine culture with yeast and no bacteria  Acute on chronic normocytic anemia, suspect dilutional, resolved a. Hb 10.2 on 4/12 -> 8.3 on admit -> 6.6 -> received 1 unit PRBCs and now stable b. Iron studies indicate an acute phase reaction, FOBT negative, LDH normal and haptoglobin negative  Sinus tachycardia  hypertension, stable  External hemorrhoids  Constipation, symptomatically resolved a. No need for surgical consult at this time b. Anusol cream BID c. Tucks Pads d. Change miralax to daily and continue colace BID e. Discontinue senna for now and try to stay away from bowel stimulants if able and prolonged sitting on the toilet  Insulin Dependent DM with hyperglycemia a. HbA1c 7.9 b. Lantus increased to 15 units. Continue home metformin and semaglutide  Hypothyroidism a. Continue synthroid  Hyperlipidemia a. Continue statin  Rales suspect atelectasis, resolved     Consultants  . none  Procedures  . none  Antibiotics   Anti-infectives (From admission, onward)   Start     Dose/Rate Route Frequency Ordered Stop   12/12/20 1000  fluconazole (DIFLUCAN) tablet 100 mg        100 mg Oral  Once 12/12/20 0912 12/12/20 1104   12/09/20 0630  cefTRIAXone (ROCEPHIN) 1 g in sodium chloride 0.9 % 100 mL IVPB  Status:  Discontinued        1 g 200 mL/hr over 30 Minutes Intravenous Every 24 hours 12/09/20 6283 12/14/20 1342       Subjective  Patient seen and examined at bedside no acute distress and resting  comfortably.  No events overnight.  Tolerating diet. In good spirits and anticipating discharge.   Denies any chest pain, shortness of breath, fever, nausea, vomiting, urinary or bowel complaints. Otherwise ROS negative    Objective   Discharge Exam: Vitals:   12/14/20 2054 12/15/20 0543  BP: 128/70 121/65  Pulse: (!) 103 93  Resp: 16 16  Temp: 99.3 F (37.4 C) 99 F (37.2 C)  SpO2: 99% 100%   Vitals:   12/14/20 0530 12/14/20 1339 12/14/20 2054 12/15/20 0543  BP: 136/73 122/66 128/70 121/65  Pulse: 90 (!) 101 (!) 103 93  Resp: 16 18 16 16   Temp: 98.2 F (36.8 C) 98.6 F (37 C) 99.3 F (37.4 C) 99 F (37.2 C)  TempSrc: Oral Oral Oral Oral  SpO2: 100% 100% 99% 100%  Weight:      Height:        Physical Exam Vitals and nursing note reviewed.  Constitutional:      Appearance: Normal appearance.  HENT:     Head: Normocephalic and atraumatic.  Eyes:     Conjunctiva/sclera: Conjunctivae normal.  Cardiovascular:     Rate and Rhythm: Normal rate and regular rhythm.  Pulmonary:     Effort: Pulmonary effort is normal.     Breath sounds: Normal breath sounds.  Abdominal:     General: Abdomen is flat.     Palpations: Abdomen is soft.  Musculoskeletal:        General: No swelling or tenderness.  Skin:    Coloration: Skin is not jaundiced or pale.  Neurological:     Mental Status: She is alert. Mental status is at baseline.  Psychiatric:        Mood and Affect: Mood normal.        Behavior: Behavior normal.       The results of significant diagnostics from this hospitalization (including imaging, microbiology, ancillary and laboratory) are listed below for reference.     Microbiology: Recent Results (from the past 240 hour(s))  Resp Panel by RT-PCR (Flu A&B, Covid) Nasopharyngeal Swab     Status: None   Collection Time: 12/09/20  6:09 AM   Specimen: Nasopharyngeal Swab; Nasopharyngeal(NP) swabs in vial transport medium  Result Value Ref Range Status   SARS  Coronavirus 2 by RT PCR NEGATIVE NEGATIVE Final    Comment: (NOTE) SARS-CoV-2 target nucleic acids are NOT DETECTED.  The SARS-CoV-2 RNA is generally detectable in upper respiratory specimens during the acute phase of infection. The lowest concentration of SARS-CoV-2 viral copies this assay can detect is 138 copies/mL. A negative result does not preclude SARS-Cov-2 infection and should not be used as the sole basis for treatment or other patient management decisions. A negative result may occur with  improper specimen collection/handling, submission of specimen other than nasopharyngeal swab, presence of viral mutation(s) within the areas targeted by this assay, and inadequate number of viral copies(<138 copies/mL). A negative result must be combined with clinical observations, patient history, and epidemiological information. The expected result is Negative.  Fact Sheet for Patients:  EntrepreneurPulse.com.au  Fact Sheet for Healthcare Providers:  IncredibleEmployment.be  This test is no t yet approved or cleared by the [place] and  has been authorized for detection and/or diagnosis of SARS-CoV-2 by FDA under an Emergency Use Authorization (EUA). This EUA will remain  in effect (meaning this test can be used) for the duration of the COVID-19 declaration under Section 564(b)(1) of the Act, 21 U.S.C.section 360bbb-3(b)(1), unless the authorization is terminated  or revoked sooner.       Influenza A by PCR NEGATIVE NEGATIVE Final   Influenza B by PCR NEGATIVE NEGATIVE Final    Comment: (NOTE) The Xpert Xpress SARS-CoV-2/FLU/RSV plus assay is intended as an aid in the diagnosis of influenza from Nasopharyngeal swab specimens and should not be used as a sole basis for treatment. Nasal washings and aspirates are unacceptable for Xpert Xpress SARS-CoV-2/FLU/RSV testing.  Fact Sheet for  Patients: EntrepreneurPulse.com.au  Fact Sheet for Healthcare Providers: IncredibleEmployment.be  This test is not yet approved or cleared by the [place] FDA and has been authorized for detection and/or diagnosis of SARS-CoV-2 by FDA under an Emergency Use Authorization (EUA). This EUA will remain in effect (meaning this test can be used) for the duration of the COVID-19 declaration under Section 564(b)(1) of the Act, 21 U.S.C. section 360bbb-3(b)(1), unless the authorization is terminated or revoked.  Performed at [facility], [place] [address] [place], [place] [address]   Urine Culture     Status: Abnormal   Collection Time: 12/09/20  6:09 AM   Specimen: Urine, Catheterized  Result Value Ref Range Status   Specimen Description   Final    URINE, CATHETERIZED Performed at [facility] [address] [place], [place] [address]    Special Requests   Final    NONE Performed at [facility], [place] [address], [place], [place] [address]    Culture 10,000 COLONIES/mL YEAST (A)  Final   Report Status 12/13/2020 FINAL  Final  Culture, Urine     Status: None   Collection Time: 12/12/20  1:54 PM   Specimen: Urine, Clean Catch  Result Value Ref Range Status   Specimen Description   Final    URINE, CLEAN CATCH Performed at [facility], [place] [address], [place], [place] [address]    Special Requests   Final    NONE Performed at [facility], [place] [address] [place], [place] [address]    Culture   Final    NO GROWTH Performed at [facility], [place] [address], [place], [place] [address]    Report Status 12/13/2020 FINAL  Final  Resp Panel by RT-PCR (Flu A&B, Covid) Nasopharyngeal Swab     Status: None   Collection Time: 12/14/20 11:16 AM   Specimen: Nasopharyngeal Swab; Nasopharyngeal(NP) swabs in vial transport medium  Result Value Ref Range  Status   SARS Coronavirus 2 by RT PCR NEGATIVE NEGATIVE Final    Comment: (NOTE) SARS-CoV-2 target nucleic acids are NOT DETECTED.  The SARS-CoV-2 RNA is generally detectable in upper respiratory specimens during the acute phase of infection. The lowest concentration of SARS-CoV-2 viral copies this assay can detect is 138 copies/mL. A negative result does not preclude SARS-Cov-2 infection and should not be used as the sole basis for treatment or other patient management decisions. A negative result may occur with  improper specimen collection/handling, submission of specimen other than nasopharyngeal swab, presence of viral mutation(s) within the areas targeted by this assay, and inadequate number of  viral copies(<138 copies/mL). A negative result must be combined with clinical observations, patient history, and epidemiological information. The expected result is Negative.  Fact Sheet for Patients:  EntrepreneurPulse.com.au  Fact Sheet for Healthcare Providers:  IncredibleEmployment.be  This test is no t yet approved or cleared by the [place] FDA and  has been authorized for detection and/or diagnosis of SARS-CoV-2 by FDA under an Emergency Use Authorization (EUA). This EUA will remain  in effect (meaning this test can be used) for the duration of the COVID-19 declaration under Section 564(b)(1) of the Act, 21 U.S.C.section 360bbb-3(b)(1), unless the authorization is terminated  or revoked sooner.       Influenza A by PCR NEGATIVE NEGATIVE Final   Influenza B by PCR NEGATIVE NEGATIVE Final    Comment: (NOTE) The Xpert Xpress SARS-CoV-2/FLU/RSV plus assay is intended as an aid in the diagnosis of influenza from Nasopharyngeal swab specimens and should not be used as a sole basis for treatment. Nasal washings and aspirates are unacceptable for Xpert Xpress SARS-CoV-2/FLU/RSV testing.  Fact Sheet for  Patients: EntrepreneurPulse.com.au  Fact Sheet for Healthcare Providers: IncredibleEmployment.be  This test is not yet approved or cleared by the [place] FDA and has been authorized for detection and/or diagnosis of SARS-CoV-2 by FDA under an Emergency Use Authorization (EUA). This EUA will remain in effect (meaning this test can be used) for the duration of the COVID-19 declaration under Section 564(b)(1) of the Act, 21 U.S.C. section 360bbb-3(b)(1), unless the authorization is terminated or revoked.  Performed at [facility], [place] [address], [place], [place] [address]      Labs: BNP (last 3 results) No results for input(s): BNP in the last 8760 hours. Basic Metabolic Panel: Recent Labs  Lab 12/09/20 0315 12/09/20 1459 12/10/20 0319 12/11/20 0307 12/13/20 0329  NA 129* 135 139 134* 135  K 5.1 4.7 4.4 4.2 4.2  CL 94* 100 105 100 100  CO2 25 27 28 26 27   GLUCOSE 414* 306* 110* 189* 131*  BUN 30* 25* 23 21 15   CREATININE 0.85 0.88 0.83 0.74 0.69  CALCIUM 8.8* 8.4* 8.6* 8.2* 8.1*   Liver Function Tests: Recent Labs  Lab 12/09/20 0315 12/10/20 0319  AST 19 47*  ALT 15 31  ALKPHOS 73 53  BILITOT 2.2* 1.7*  PROT 6.5 5.2*  ALBUMIN 3.3* 2.6*   No results for input(s): LIPASE, AMYLASE in the last 168 hours. No results for input(s): AMMONIA in the last 168 hours. CBC: Recent Labs  Lab 12/09/20 0315 12/10/20 0319 12/10/20 1430 12/10/20 2023 12/11/20 0307 12/13/20 0329  WBC 6.4 4.3  --   --  4.1 4.2  NEUTROABS  --  3.0  --   --   --   --   HGB 8.3* 6.6* 9.2* 9.5* 9.1* 9.3*  HCT 26.1* 21.0* 28.5* 29.6* 27.9* 28.5*  MCV 94.9 95.5  --   --  92.1 93.4  PLT 356 299  --   --  279 264   Cardiac Enzymes: No results for input(s): CKTOTAL, CKMB, CKMBINDEX, TROPONINI in the last 168 hours. BNP: Invalid input(s): POCBNP CBG: Recent Labs  Lab 12/14/20 0737 12/14/20 1108 12/14/20 1657 12/14/20 2056  12/15/20 0755  GLUCAP 239* 311* 400* 239* 140*   D-Dimer No results for input(s): DDIMER in the last 72 hours. Hgb A1c No results for input(s): HGBA1C in the last 72 hours. Lipid Profile No results for input(s): CHOL, HDL, LDLCALC, TRIG, CHOLHDL, LDLDIRECT in the last 72 hours.  Thyroid function studies No results for input(s): TSH, T4TOTAL, T3FREE, THYROIDAB in the last 72 hours.  Invalid input(s): FREET3 Anemia work up No results for input(s): VITAMINB12, FOLATE, FERRITIN, TIBC, IRON, RETICCTPCT in the last 72 hours. Urinalysis    Component Value Date/Time   COLORURINE YELLOW 12/12/2020 1354   APPEARANCEUR TURBID (A) 12/12/2020 1354   LABSPEC 1.015 12/12/2020 1354   PHURINE 5.0 12/12/2020 1354   GLUCOSEU >=500 (A) 12/12/2020 1354   HGBUR SMALL (A) 12/12/2020 1354   HGBUR trace-intact 07/20/2010 1433   BILIRUBINUR NEGATIVE 12/12/2020 1354   BILIRUBINUR negative 09/09/2020 1604   BILIRUBINUR NEG 08/09/2016 1500   KETONESUR NEGATIVE 12/12/2020 1354   PROTEINUR 100 (A) 12/12/2020 1354   UROBILINOGEN 1.0 10/23/2020 1420   NITRITE NEGATIVE 12/12/2020 1354   LEUKOCYTESUR MODERATE (A) 12/12/2020 1354   Sepsis Labs Invalid input(s): PROCALCITONIN,  WBC,  LACTICIDVEN Microbiology Recent Results (from the past 240 hour(s))  Resp Panel by RT-PCR (Flu A&B, Covid) Nasopharyngeal Swab     Status: None   Collection Time: 12/09/20  6:09 AM   Specimen: Nasopharyngeal Swab; Nasopharyngeal(NP) swabs in vial transport medium  Result Value Ref Range Status   SARS Coronavirus 2 by RT PCR NEGATIVE NEGATIVE Final    Comment: (NOTE) SARS-CoV-2 target nucleic acids are NOT DETECTED.  The SARS-CoV-2 RNA is generally detectable in upper respiratory specimens during the acute phase of infection. The lowest concentration of SARS-CoV-2 viral copies this assay can detect is 138 copies/mL. A negative result does not preclude SARS-Cov-2 infection and should not be used as the sole basis for  treatment or other patient management decisions. A negative result may occur with  improper specimen collection/handling, submission of specimen other than nasopharyngeal swab, presence of viral mutation(s) within the areas targeted by this assay, and inadequate number of viral copies(<138 copies/mL). A negative result must be combined with clinical observations, patient history, and epidemiological information. The expected result is Negative.  Fact Sheet for Patients:  EntrepreneurPulse.com.au  Fact Sheet for Healthcare Providers:  IncredibleEmployment.be  This test is no t yet approved or cleared by the [place] FDA and  has been authorized for detection and/or diagnosis of SARS-CoV-2 by FDA under an Emergency Use Authorization (EUA). This EUA will remain  in effect (meaning this test can be used) for the duration of the COVID-19 declaration under Section 564(b)(1) of the Act, 21 U.S.C.section 360bbb-3(b)(1), unless the authorization is terminated  or revoked sooner.       Influenza A by PCR NEGATIVE NEGATIVE Final   Influenza B by PCR NEGATIVE NEGATIVE Final    Comment: (NOTE) The Xpert Xpress SARS-CoV-2/FLU/RSV plus assay is intended as an aid in the diagnosis of influenza from Nasopharyngeal swab specimens and should not be used as a sole basis for treatment. Nasal washings and aspirates are unacceptable for Xpert Xpress SARS-CoV-2/FLU/RSV testing.  Fact Sheet for Patients: EntrepreneurPulse.com.au  Fact Sheet for Healthcare Providers: IncredibleEmployment.be  This test is not yet approved or cleared by the [place] FDA and has been authorized for detection and/or diagnosis of SARS-CoV-2 by FDA under an Emergency Use Authorization (EUA). This EUA will remain in effect (meaning this test can be used) for the duration of the COVID-19 declaration under Section 564(b)(1) of the Act, 21  U.S.C. section 360bbb-3(b)(1), unless the authorization is terminated or revoked.  Performed at [facility], [place] [address] [place], [place] [address]   Urine Culture     Status: Abnormal   Collection Time: 12/09/20  6:09 AM   Specimen: Urine, Catheterized  Result Value Ref Range Status   Specimen Description   Final    URINE, CATHETERIZED Performed at [facility] [address], [place], [place] [address]    Special Requests   Final    NONE Performed at [facility], [place] [address] [place], [place] [address]    Culture 10,000 COLONIES/mL YEAST (A)  Final   Report Status 12/13/2020 FINAL  Final  Culture, Urine     Status: None   Collection Time: 12/12/20  1:54 PM   Specimen: Urine, Clean Catch  Result Value Ref Range Status   Specimen Description   Final    URINE, CLEAN CATCH Performed at [facility], [place] [address] [place], [place] [address]    Special Requests   Final    NONE Performed at [facility], [place] [address] [place], [place] [address]    Culture   Final    NO GROWTH Performed at [facility], [place] [address], [place], [place] [address]    Report Status 12/13/2020 FINAL  Final  Resp Panel by RT-PCR (Flu A&B, Covid) Nasopharyngeal Swab     Status: None   Collection Time: 12/14/20 11:16 AM   Specimen: Nasopharyngeal Swab; Nasopharyngeal(NP) swabs in vial transport medium  Result Value Ref Range Status   SARS Coronavirus 2 by RT PCR NEGATIVE NEGATIVE Final    Comment: (NOTE) SARS-CoV-2 target nucleic acids are NOT DETECTED.  The SARS-CoV-2 RNA is generally detectable in upper respiratory specimens during the acute phase of infection. The lowest concentration of SARS-CoV-2 viral copies this assay can detect is 138 copies/mL. A negative result does not preclude SARS-Cov-2 infection and should not be used as the sole basis for treatment or other patient  management decisions. A negative result may occur with  improper specimen collection/handling, submission of specimen other than nasopharyngeal swab, presence of viral mutation(s) within the areas targeted by this assay, and inadequate number of viral copies(<138 copies/mL). A negative result must be combined with clinical observations, patient history, and epidemiological information. The expected result is Negative.  Fact Sheet for Patients:  EntrepreneurPulse.com.au  Fact Sheet for Healthcare Providers:  IncredibleEmployment.be  This test is no t yet approved or cleared by the [place] FDA and  has been authorized for detection and/or diagnosis of SARS-CoV-2 by FDA under an Emergency Use Authorization (EUA). This EUA will remain  in effect (meaning this test can be used) for the duration of the COVID-19 declaration under Section 564(b)(1) of the Act, 21 U.S.C.section 360bbb-3(b)(1), unless the authorization is terminated  or revoked sooner.       Influenza A by PCR NEGATIVE NEGATIVE Final   Influenza B by PCR NEGATIVE NEGATIVE Final    Comment: (NOTE) The Xpert Xpress SARS-CoV-2/FLU/RSV plus assay is intended as an aid in the diagnosis of influenza from Nasopharyngeal swab specimens and should not be used as a sole basis for treatment. Nasal washings and aspirates are unacceptable for Xpert Xpress SARS-CoV-2/FLU/RSV testing.  Fact Sheet for Patients: EntrepreneurPulse.com.au  Fact Sheet for Healthcare Providers: IncredibleEmployment.be  This test is not yet approved or cleared by the [place] FDA and has been authorized for detection and/or diagnosis of SARS-CoV-2 by FDA under an Emergency Use Authorization (EUA). This EUA will remain in effect (meaning this test can be used) for the duration of the COVID-19 declaration under Section 564(b)(1) of the Act, 21 U.S.C. section 360bbb-3(b)(1),  unless  the authorization is terminated or revoked.  Performed at [facility], [place] [address], [place], [place] [address]     Discharge Instructions     Discharge Instructions    Diet - low sodium heart healthy   Complete by: As directed    Diet Carb Modified   Complete by: As directed    Discharge instructions   Complete by: As directed    You were seen in the hospital for confusion thought to be from a UTI. You also had anemia that required a blood transfusion.   Upon discharge: - increase your Lantus to 15 units daily and continue your metformin and Semaglutide - monitor your stool for any blood or a black tarry consistency as this can be signs of a GI bleed - You can take miralax daily for constipation and purchase Colace (docusate) from the pharmacy to use as needed for constipation as well. Try not to use Dulcolax and Senna to treat constipation as this can worsen your hemorrhoids  - You can use Anusol rectal cream and Tucks Pad for your hemorrhoids - see your primary care physician if any worsening pain   If you have any change or worsening of your symptoms do not hesitate to contact your primary care physician.   Increase activity slowly   Complete by: As directed      Allergies as of 12/15/2020      Reactions   Levemir [insulin Detemir] Itching      Medication List    STOP taking these medications   bisacodyl 10 MG suppository Commonly known as: Dulcolax   cephALEXin 500 MG capsule Commonly known as: KEFLEX   Ferrous Bisglycinate Chelate 28 MG Caps   fluconazole 150 MG tablet Commonly known as: DIFLUCAN   senna 8.6 MG Tabs tablet Commonly known as: SENOKOT     TAKE these medications   Accu-Chek Aviva Plus test strip Generic drug: glucose blood USE TO TEST BLOOD SUGAR UP TO 3 TIMES A DAY.   Accu-Chek Softclix Lancets lancets USE TO TEST BLOOD SUGAR UP TO 3 TIMES A DAY.   Alcohol Swabs Pads 1 Piece by Does not apply route  daily.   aspirin 81 MG chewable tablet Chew 81 mg by mouth every morning.   atorvastatin 40 MG tablet Commonly known as: LIPITOR Take 1 tablet (40 mg total) by mouth daily.   brimonidine 0.1 % Soln Commonly known as: ALPHAGAN P Place 1 drop into both eyes 2 (two) times daily.   carbamide peroxide 6.5 % OTIC solution Commonly known as: Debrox Place 5 drops into both ears 2 (two) times daily. What changed:   how much to take  when to take this  reasons to take this   Catheter Self-Adhesive Urinary Misc 1 patch by Does not apply route daily.   docusate sodium 100 MG capsule Commonly known as: COLACE Take 1 capsule (100 mg total) by mouth 2 (two) times daily.   dorzolamide-timolol 22.3-6.8 MG/ML ophthalmic solution Commonly known as: COSOPT Place 1 drop into both eyes 2 (two) times daily.   DULoxetine 60 MG capsule Commonly known as: CYMBALTA TAKE 1 CAPSULE BY MOUTH EVERY DAY What changed:   how much to take  how to take this  when to take this  additional instructions   enalapril 20 MG tablet Commonly known as: VASOTEC Take 1 tablet (20 mg total) by mouth daily.   famotidine 20 MG tablet Commonly known as: Pepcid Take 1 tablet (20 mg total) by  mouth daily. If needed can take twice a day.   ferrous sulfate 325 (65 FE) MG tablet Take 325 mg by mouth daily with breakfast.   insulin glargine 100 UNIT/ML injection Commonly known as: LANTUS Inject 0.15 mLs (15 Units total) into the skin daily. What changed:   how much to take  when to take this   ketoconazole 2 % cream Commonly known as: NIZORAL Apply 1 application topically daily. What changed:   when to take this  reasons to take this   levothyroxine 50 MCG tablet Commonly known as: SYNTHROID Take 1 tablet (50 mcg total) by mouth daily.   metFORMIN 500 MG 24 hr tablet Commonly known as: GLUCOPHAGE-XR Take 2 tablets (1,000 mg total) by mouth every morning.   mirabegron ER 25 MG Tb24  tablet Commonly known as: Myrbetriq Take 1 tablet (25 mg total) by mouth daily.   ondansetron 4 MG disintegrating tablet Commonly known as: Zofran ODT Take 1 tablet (4 mg total) by mouth every 8 (eight) hours as needed for nausea.   polyethylene glycol powder 17 GM/SCOOP powder Commonly known as: GLYCOLAX/MIRALAX Take 17 g by mouth daily. What changed:   when to take this  reasons to take this   PreserVision AREDS 2+Multi Vit Caps Take 1 capsule by mouth daily.   Rybelsus 3 MG Tabs Generic drug: Semaglutide Take 3 mg by mouth daily.   Sure Comfort Insulin Syringe 31G X 5/16" 0.3 ML Misc Generic drug: Insulin Syringe-Needle U-100 use 1 syringe with insulin daily   tamsulosin 0.4 MG Caps capsule Commonly known as: FLOMAX Take 0.4 mg by mouth daily.   Vyzulta 0.024 % Soln Generic drug: Latanoprostene Bunod Place 1 drop into both eyes at bedtime.   witch hazel-glycerin pad Commonly known as: TUCKS Apply topically as needed for itching.       Allergies  Allergen Reactions  . Levemir [Insulin Detemir] Itching    Dispo: The patient is from: Home              Anticipated d/c is to: SNF              Patient currently is medically stable to d/c.        Time coordinating discharge: Over 30 minutes   SIGNED:   Harold Hedge, D.O. [facility] Pager: [phone]  12/15/2020, 9:25 AM

## 2020-12-15 NOTE — Plan of Care (Signed)
Pt ready for DC to [facility]

## 2020-12-15 NOTE — Consult Note (Signed)
   [facility] Inpatient Consult   12/15/2020  Hayley Fisher December 15, 1930 [id number]  Patient screened for high risk score for unplanned readmission. Chart reviewed to assess for potential [place] [facility] community service needs. Per review, patient is being recommended for a skilled nursing facility level of care.  Of note, [facility] Care Management services does not replace or interfere with any services that are arranged by inpatient case management or social work.  Netta Cedars, MSN, [facility] Liaison Nurse Mobile Phone [phone]  Toll free office [phone]

## 2020-12-15 NOTE — Telephone Encounter (Signed)
Patient's daughter [name] is calling and would like for Dr. Tarry Kos to call her as soon as possible. She would like to discuss issues patient has been having since being in the hospital.   The best call back is [phone]

## 2020-12-15 NOTE — TOC Transition Note (Signed)
Transition of Care [facility]) - CM/SW Discharge Note   Patient Details  Name: Hayley Fisher MRN: [id number] Date of Birth: 14-Jun-1931  [facility] CM/SW Contact:  Lennart Pall, LCSW Phone Number: 12/15/2020, 10:35 AM   Clinical Narrative:     Pt and daughter have accepted a SNF bed at [facility] and pt medically cleared for dc today.  Insurance auth received (ref# [id number]).  Will transport via [place] - called at 10:45am.  RN to call report to [phone].  No further TOC needs.  Final next level of care: Skilled Nursing Facility Barriers to Discharge: Barriers Resolved   Patient Goals and CMS Choice Patient states their goals for this hospitalization and ongoing recovery are:: return home CMS Medicare.gov Compare Post Acute Care list provided to:: Patient Represenative (must comment) (daughter) Choice offered to / list presented to : Adult Children  Discharge Placement   Existing PASRR number confirmed : 12/12/20          Patient chooses bed at: [facility] Patient to be transferred to facility by: [place] Name of family member notified: daughter, [name] Patient and family notified of of transfer: 12/15/20  Discharge Plan and Services In-house Referral: Clinical Social Work              DME Arranged: N/A DME Agency: NA       HH Arranged: NA HH Agency: NA Date HH Agency Contacted: 12/12/20 Time [place]: [id number] Representative spoke with at [place]: NA  Social Determinants of Health ([place]) Interventions     Readmission Risk Interventions Readmission Risk Prevention Plan 07/05/2020  Transportation Screening Complete  Home Care Screening Complete  Medication Review (RN CM) Complete  Some recent data might be hidden

## 2020-12-15 NOTE — Plan of Care (Signed)
Plan of care reviewed. Continue current plan. 

## 2020-12-16 ENCOUNTER — Encounter: Payer: Self-pay | Admitting: Family Medicine

## 2020-12-16 DIAGNOSIS — R5381 Other malaise: Secondary | ICD-10-CM | POA: Diagnosis not present

## 2020-12-16 DIAGNOSIS — D649 Anemia, unspecified: Secondary | ICD-10-CM | POA: Diagnosis not present

## 2020-12-16 DIAGNOSIS — N390 Urinary tract infection, site not specified: Secondary | ICD-10-CM | POA: Diagnosis not present

## 2020-12-16 NOTE — Telephone Encounter (Signed)
Spoke to daughter [name] regarding concerns. Patient recently discharged and currently in [facility] for skill nursing. [name] requests forms of Community Alternatives Program for Disabled Adults be completed in order to help keep [name] in her home and to receive the assistance they need for this to happen. Forms found in physical box and completed.  Recommended she follow up with me once she discharged from [facility]  Patient would like benefit from geriatric reevaluation given changes in mentation and symptoms concerning for sundowning.

## 2020-12-16 NOTE — Telephone Encounter (Signed)
Patient has received treatment at discharge from hospital. Evidence of external hemorrhoids with improvement in symptoms with Anusol cream, Tucks pads, and treatment for constipation.

## 2020-12-16 NOTE — Telephone Encounter (Signed)
Forms completed. Daughter Hayley Fisher to call tomorrow with appropriate fax number.

## 2020-12-18 ENCOUNTER — Telehealth: Payer: Self-pay

## 2020-12-18 NOTE — Telephone Encounter (Signed)
Received a call from [place] at the [facility] CAP program.  She said they are an alternative program designed to keep patients from having to reside in an institutional setting.  Patient is currently in a skill facility and family is wanting to bring her home but this would required assistance from CAP. Tammy is refaxing the form for Dr. Tarry Kos to correct to portion of the form stating that patient doesn't qualify for an institution.  If provider can please mark through that answer and check yes, this would allow patients application to at least be processed and then she would be evaluated.  Will forward to MD.  Johnney Ou

## 2020-12-18 NOTE — Telephone Encounter (Signed)
Pt's daughter [name], called to check status of forms and provided fax# [phone] for paperwork to be faxed to her. She requested Dr. Tarry Kos to give her a call

## 2020-12-18 NOTE — Telephone Encounter (Signed)
Please inform [name] that forms have been completed and placed up front to be faxed.

## 2020-12-18 NOTE — Telephone Encounter (Signed)
Form was received and placed in provider's box for correction.  Please call Tammy with any questions [phone]. Ritta Hammes,CMA

## 2020-12-20 NOTE — Telephone Encounter (Signed)
Form was corrected and placed back in box to be faxed.

## 2020-12-21 DIAGNOSIS — R319 Hematuria, unspecified: Secondary | ICD-10-CM | POA: Diagnosis not present

## 2020-12-22 DIAGNOSIS — R5381 Other malaise: Secondary | ICD-10-CM | POA: Diagnosis not present

## 2020-12-22 DIAGNOSIS — E039 Hypothyroidism, unspecified: Secondary | ICD-10-CM | POA: Diagnosis not present

## 2020-12-22 DIAGNOSIS — G9341 Metabolic encephalopathy: Secondary | ICD-10-CM | POA: Diagnosis not present

## 2020-12-22 DIAGNOSIS — D649 Anemia, unspecified: Secondary | ICD-10-CM | POA: Diagnosis not present

## 2020-12-22 DIAGNOSIS — N390 Urinary tract infection, site not specified: Secondary | ICD-10-CM | POA: Diagnosis not present

## 2020-12-22 DIAGNOSIS — N189 Chronic kidney disease, unspecified: Secondary | ICD-10-CM | POA: Diagnosis not present

## 2020-12-22 DIAGNOSIS — R2681 Unsteadiness on feet: Secondary | ICD-10-CM | POA: Diagnosis not present

## 2020-12-22 DIAGNOSIS — I739 Peripheral vascular disease, unspecified: Secondary | ICD-10-CM | POA: Diagnosis not present

## 2020-12-22 DIAGNOSIS — E1165 Type 2 diabetes mellitus with hyperglycemia: Secondary | ICD-10-CM | POA: Diagnosis not present

## 2020-12-22 DIAGNOSIS — M6281 Muscle weakness (generalized): Secondary | ICD-10-CM | POA: Diagnosis not present

## 2020-12-22 DIAGNOSIS — E782 Mixed hyperlipidemia: Secondary | ICD-10-CM | POA: Diagnosis not present

## 2020-12-22 DIAGNOSIS — I10 Essential (primary) hypertension: Secondary | ICD-10-CM | POA: Diagnosis not present

## 2020-12-29 ENCOUNTER — Ambulatory Visit: Payer: Medicare Other | Admitting: Endocrinology

## 2020-12-29 DIAGNOSIS — E782 Mixed hyperlipidemia: Secondary | ICD-10-CM | POA: Diagnosis not present

## 2020-12-29 DIAGNOSIS — R2681 Unsteadiness on feet: Secondary | ICD-10-CM | POA: Diagnosis not present

## 2020-12-29 DIAGNOSIS — D649 Anemia, unspecified: Secondary | ICD-10-CM | POA: Diagnosis not present

## 2020-12-29 DIAGNOSIS — M6281 Muscle weakness (generalized): Secondary | ICD-10-CM | POA: Diagnosis not present

## 2020-12-29 DIAGNOSIS — G9341 Metabolic encephalopathy: Secondary | ICD-10-CM | POA: Diagnosis not present

## 2020-12-29 DIAGNOSIS — N390 Urinary tract infection, site not specified: Secondary | ICD-10-CM | POA: Diagnosis not present

## 2020-12-29 DIAGNOSIS — N189 Chronic kidney disease, unspecified: Secondary | ICD-10-CM | POA: Diagnosis not present

## 2020-12-29 DIAGNOSIS — I739 Peripheral vascular disease, unspecified: Secondary | ICD-10-CM | POA: Diagnosis not present

## 2020-12-29 DIAGNOSIS — I10 Essential (primary) hypertension: Secondary | ICD-10-CM | POA: Diagnosis not present

## 2020-12-29 DIAGNOSIS — E039 Hypothyroidism, unspecified: Secondary | ICD-10-CM | POA: Diagnosis not present

## 2020-12-31 DIAGNOSIS — I10 Essential (primary) hypertension: Secondary | ICD-10-CM | POA: Diagnosis not present

## 2020-12-31 DIAGNOSIS — R5381 Other malaise: Secondary | ICD-10-CM | POA: Diagnosis not present

## 2020-12-31 DIAGNOSIS — E1165 Type 2 diabetes mellitus with hyperglycemia: Secondary | ICD-10-CM | POA: Diagnosis not present

## 2020-12-31 DIAGNOSIS — N390 Urinary tract infection, site not specified: Secondary | ICD-10-CM | POA: Diagnosis not present

## 2020-12-31 DIAGNOSIS — G9341 Metabolic encephalopathy: Secondary | ICD-10-CM | POA: Diagnosis not present

## 2020-12-31 DIAGNOSIS — R2681 Unsteadiness on feet: Secondary | ICD-10-CM | POA: Diagnosis not present

## 2020-12-31 DIAGNOSIS — N189 Chronic kidney disease, unspecified: Secondary | ICD-10-CM | POA: Diagnosis not present

## 2020-12-31 DIAGNOSIS — R319 Hematuria, unspecified: Secondary | ICD-10-CM | POA: Diagnosis not present

## 2020-12-31 DIAGNOSIS — E782 Mixed hyperlipidemia: Secondary | ICD-10-CM | POA: Diagnosis not present

## 2020-12-31 DIAGNOSIS — M6281 Muscle weakness (generalized): Secondary | ICD-10-CM | POA: Diagnosis not present

## 2020-12-31 DIAGNOSIS — E039 Hypothyroidism, unspecified: Secondary | ICD-10-CM | POA: Diagnosis not present

## 2020-12-31 DIAGNOSIS — D649 Anemia, unspecified: Secondary | ICD-10-CM | POA: Diagnosis not present

## 2020-12-31 DIAGNOSIS — I739 Peripheral vascular disease, unspecified: Secondary | ICD-10-CM | POA: Diagnosis not present

## 2021-01-05 DIAGNOSIS — G9341 Metabolic encephalopathy: Secondary | ICD-10-CM | POA: Diagnosis not present

## 2021-01-05 DIAGNOSIS — N390 Urinary tract infection, site not specified: Secondary | ICD-10-CM | POA: Diagnosis not present

## 2021-01-05 DIAGNOSIS — M6281 Muscle weakness (generalized): Secondary | ICD-10-CM | POA: Diagnosis not present

## 2021-01-05 DIAGNOSIS — D649 Anemia, unspecified: Secondary | ICD-10-CM | POA: Diagnosis not present

## 2021-01-05 DIAGNOSIS — E782 Mixed hyperlipidemia: Secondary | ICD-10-CM | POA: Diagnosis not present

## 2021-01-05 DIAGNOSIS — R2681 Unsteadiness on feet: Secondary | ICD-10-CM | POA: Diagnosis not present

## 2021-01-05 DIAGNOSIS — I739 Peripheral vascular disease, unspecified: Secondary | ICD-10-CM | POA: Diagnosis not present

## 2021-01-05 DIAGNOSIS — R410 Disorientation, unspecified: Secondary | ICD-10-CM | POA: Diagnosis not present

## 2021-01-05 DIAGNOSIS — E039 Hypothyroidism, unspecified: Secondary | ICD-10-CM | POA: Diagnosis not present

## 2021-01-05 DIAGNOSIS — N189 Chronic kidney disease, unspecified: Secondary | ICD-10-CM | POA: Diagnosis not present

## 2021-01-05 DIAGNOSIS — I10 Essential (primary) hypertension: Secondary | ICD-10-CM | POA: Diagnosis not present

## 2021-01-06 DIAGNOSIS — R5381 Other malaise: Secondary | ICD-10-CM | POA: Diagnosis not present

## 2021-01-07 DIAGNOSIS — N189 Chronic kidney disease, unspecified: Secondary | ICD-10-CM | POA: Diagnosis not present

## 2021-01-07 DIAGNOSIS — I10 Essential (primary) hypertension: Secondary | ICD-10-CM | POA: Diagnosis not present

## 2021-01-07 DIAGNOSIS — N390 Urinary tract infection, site not specified: Secondary | ICD-10-CM | POA: Diagnosis not present

## 2021-01-07 DIAGNOSIS — D649 Anemia, unspecified: Secondary | ICD-10-CM | POA: Diagnosis not present

## 2021-01-07 DIAGNOSIS — M6281 Muscle weakness (generalized): Secondary | ICD-10-CM | POA: Diagnosis not present

## 2021-01-07 DIAGNOSIS — G9341 Metabolic encephalopathy: Secondary | ICD-10-CM | POA: Diagnosis not present

## 2021-01-07 DIAGNOSIS — E039 Hypothyroidism, unspecified: Secondary | ICD-10-CM | POA: Diagnosis not present

## 2021-01-07 DIAGNOSIS — E782 Mixed hyperlipidemia: Secondary | ICD-10-CM | POA: Diagnosis not present

## 2021-01-07 DIAGNOSIS — I739 Peripheral vascular disease, unspecified: Secondary | ICD-10-CM | POA: Diagnosis not present

## 2021-01-07 DIAGNOSIS — R2681 Unsteadiness on feet: Secondary | ICD-10-CM | POA: Diagnosis not present

## 2021-01-08 DIAGNOSIS — N390 Urinary tract infection, site not specified: Secondary | ICD-10-CM | POA: Diagnosis not present

## 2021-01-08 DIAGNOSIS — R5381 Other malaise: Secondary | ICD-10-CM | POA: Diagnosis not present

## 2021-01-11 ENCOUNTER — Ambulatory Visit: Payer: Medicare Other | Admitting: Endocrinology

## 2021-01-12 DIAGNOSIS — N3020 Other chronic cystitis without hematuria: Secondary | ICD-10-CM | POA: Diagnosis not present

## 2021-01-12 DIAGNOSIS — I739 Peripheral vascular disease, unspecified: Secondary | ICD-10-CM | POA: Diagnosis not present

## 2021-01-12 DIAGNOSIS — I10 Essential (primary) hypertension: Secondary | ICD-10-CM | POA: Diagnosis not present

## 2021-01-12 DIAGNOSIS — N189 Chronic kidney disease, unspecified: Secondary | ICD-10-CM | POA: Diagnosis not present

## 2021-01-12 DIAGNOSIS — E039 Hypothyroidism, unspecified: Secondary | ICD-10-CM | POA: Diagnosis not present

## 2021-01-12 DIAGNOSIS — G9341 Metabolic encephalopathy: Secondary | ICD-10-CM | POA: Diagnosis not present

## 2021-01-12 DIAGNOSIS — E782 Mixed hyperlipidemia: Secondary | ICD-10-CM | POA: Diagnosis not present

## 2021-01-12 DIAGNOSIS — N390 Urinary tract infection, site not specified: Secondary | ICD-10-CM | POA: Diagnosis not present

## 2021-01-12 DIAGNOSIS — M6281 Muscle weakness (generalized): Secondary | ICD-10-CM | POA: Diagnosis not present

## 2021-01-12 DIAGNOSIS — D649 Anemia, unspecified: Secondary | ICD-10-CM | POA: Diagnosis not present

## 2021-01-12 DIAGNOSIS — R3914 Feeling of incomplete bladder emptying: Secondary | ICD-10-CM | POA: Diagnosis not present

## 2021-01-12 DIAGNOSIS — R2681 Unsteadiness on feet: Secondary | ICD-10-CM | POA: Diagnosis not present

## 2021-01-13 ENCOUNTER — Encounter: Payer: Self-pay | Admitting: Adult Health Nurse Practitioner

## 2021-01-13 ENCOUNTER — Other Ambulatory Visit: Payer: Self-pay

## 2021-01-13 ENCOUNTER — Non-Acute Institutional Stay: Payer: Medicare Other | Admitting: Adult Health Nurse Practitioner

## 2021-01-13 VITALS — HR 88 | Wt 137.6 lb

## 2021-01-13 DIAGNOSIS — F0390 Unspecified dementia without behavioral disturbance: Secondary | ICD-10-CM

## 2021-01-13 DIAGNOSIS — Z515 Encounter for palliative care: Secondary | ICD-10-CM | POA: Diagnosis not present

## 2021-01-13 DIAGNOSIS — N390 Urinary tract infection, site not specified: Secondary | ICD-10-CM

## 2021-01-13 NOTE — Progress Notes (Signed)
[facility] Pall[facility] Consult Note Telephone: [phone]  Fax: [phone]    Date of encounter: 01/13/21 PATIENT NAME: Hayley Fisher [address] [place] [address]   [phone] (home)  DOB: 09/29/1930 MRN: [id number] PRIMARY CARE PROVIDER:    Danna Hefty, DO,  [address] [place] [address] [phone]  REFERRING PROVIDER:   Roddie Mc PA  RESPONSIBLE PARTY:    Contact Information    Name Relation Home Work [address]   [name] Daughter [phone]  [phone]   Elba Barman [phone]         I met face to face with patient in facility. Palliative Care was asked to follow this patient by consultation request of  Roddie Mc PA to address advance care planning and complex medical decision making. This is the initial visit.   Spoke with daughter via telephone to update on today's visit                                  [place] / RECOMMENDATIONS:   Advance Care Planning/Goals of Care: Goals include to maximize quality of life and symptom management. Our advance care planning conversation included a discussion about: Daughter did state that she needed to fill out healthcare and financial power of attorney and advanced directives.  She provided me her email and sent her a copy of the [place] of attorney and advanced directives.  Have advised her to work with Education officer, museum at facility for guidance on the financial power of attorney.  Briefly went over what she thinks her mom's medical wishes would be.  She states that she wants to talk to her mom about a DNR.  Patient will remain full code until family has ACP discussion.  CODE STATUS: Full code  Symptom Management/Plan:  Frequent UTIs: Daughter does state that her mother is most confused and has declined functional status with UTIs.  States that this improves once she gets treatment for UTI.  Continue  PT/OT as ordered to regain strength.  Dementia: Functional status somewhat declined but this could be due to deconditioning after hospitalization for UTI.  Appetite is good and patient is gaining weight.  Daughter does state that the goal is for her to go back to her home with her sister.  Daughter does have concerns about durable medical equipment that her mother may need when she does go home and discussed that physical therapy will help her with what her mom will need when she does go home.  Daughter does state that once her mom goes home that she will need care around-the-clock.  She does get some hours throughout the week through [facility] PCS services but she will need more than this.  Encouraged to work with Education officer, museum at facility with assistance for in home care.  May need to discuss options for ALF if family unable to provide 24/7 care in the home.   Follow up Palliative Care Visit: Palliative care will continue to follow for complex medical decision making, advance care planning, and clarification of goals. Return 2-4 weeks or prn.  Daughter encouraged to call with any questions or concerns.  I spent 45 minutes providing this consultation. More than 50% of the time in this consultation was spent in counseling and care coordination.   PPS: 40%  HOSPICE ELIGIBILITY/DIAGNOSIS: TBD  Chief Complaint: Initial palliative visit  HISTORY OF PRESENT  ILLNESS:  Hayley Fisher is a 87 y.o. 87 year old female  with dementia, DMT2, hypothyroidism, HTN, anemia, CKD, PVD, glaucoma, HLD, hyperplastic colon polyp.  Patient had hospitalization 11/11 through 07/06/2020 for DKA.  She was also seen in the ED on 07/27/2020 for hyperglycemia.  On 10/23/2020 she was in the ED for hematuria and treated with Keflex for UTI.  12/01/2020 was in the ED for hyperglycemia and then again on 4/20 through 12/15/2020 was hospitalized for metabolic encephalopathy related to UTI and hyperglycemia.  During this hospitalization she did  have drop in hemoglobin from 10.2-6.6 which required 1 unit of PRBCs.  No overt GI bleeding was found.  Patient is now at [facility] for short-term rehab with PT/OT/ST.  Staff does report that she eats good at times and she has gained 5 pounds since being at the facility and current weight is 137.6 with BMI of 26.  Patient does state that her bottom hurts due to a pressure ulcer.  Staff reports that she does not have pressure ulcer and that her skin looks good.  She was noted to have stage I pressure ulcer to coccyx at her ED visit in December.  Patient has no other concerns.  Staff have no other concerns at this time.  Daughter does state that the goal is to bring her back to her home in [place] where she will live with her sister.  Daughter does state that she will need around-the-clock care in the home.  States that her mom does get PCS services through Medicaid.  History obtained from review of EMR and interview with family, facility staff and Ms. Lietz.  I reviewed available labs, medications, imaging, studies and related documents from the EMR.  Records reviewed and summarized above.   Physical Exam:  Constitutional: NAD General: WNWD EYES: anicteric sclera, lids intact, no discharge  ENMT: intact hearing, oral mucous membranes moist CV: S1S2, RRR, no LE edema Pulmonary: LCTA, no increased work of breathing, no cough Abdomen: normo-active BS + 4 quadrants, soft and non tender GU: Foley catheter in place with dark yellow urine noted in bag, no hematuria noted MSK: patient in wheelchair today but states that she walks in walker at home; arthritic changes noted to hands Skin: warm and dry, no rashes on visible skin Neuro:  A and O to person and place Hem/lymph/immuno: no widespread bruising   CURRENT PROBLEM LIST:  Patient Active Problem List   Diagnosis Date Noted  . Acute encephalopathy 12/09/2020  . Hyperbilirubinemia 12/09/2020  . Hypothyroidism 12/09/2020  . Memory changes 10/20/2020   . Acute on chronic anemia 09/21/2020  . Gastroesophageal reflux disease without esophagitis 09/16/2020  . Urinary frequency 09/11/2020  . Stage 1 decubitus ulcer 07/30/2020  . Uncontrolled type 2 diabetes mellitus with hyperglycemia ([place]) 07/02/2020  . Unstable gait 04/03/2018  . Frequent falls 04/03/2018  . Age-related physical debility 03/22/2018  . Skin ulcer of sacrum, limited to breakdown of skin ([place]) 03/16/2017  . Spinal stenosis of lumbar region 01/13/2016  . Chronic venous insufficiency 11/09/2015  . DDD (degenerative disc disease), lumbosacral 11/24/2014  . Constipation by delayed colonic transit 11/24/2014  . Frequent UTI 10/29/2012  . Type 2 diabetes mellitus with diabetic neuropathy ([place]) 10/19/2006  . Hyperlipidemia associated with type 2 diabetes mellitus ([place]) 10/19/2006  . Hypertension associated with diabetes ([place]) 10/19/2006  . Osteopenia 10/19/2006   PAST MEDICAL HISTORY:  Active Ambulatory Problems    Diagnosis Date Noted  . Type 2 diabetes mellitus with diabetic neuropathy (HCC)  10/19/2006  . Hyperlipidemia associated with type 2 diabetes mellitus ([place]) 10/19/2006  . Hypertension associated with diabetes ([place]) 10/19/2006  . Osteopenia 10/19/2006  . Frequent UTI 10/29/2012  . DDD (degenerative disc disease), lumbosacral 11/24/2014  . Constipation by delayed colonic transit 11/24/2014  . Chronic venous insufficiency 11/09/2015  . Spinal stenosis of lumbar region 01/13/2016  . Skin ulcer of sacrum, limited to breakdown of skin ([place]) 03/16/2017  . Unstable gait 04/03/2018  . Frequent falls 04/03/2018  . Uncontrolled type 2 diabetes mellitus with hyperglycemia ([place]) 07/02/2020  . Stage 1 decubitus ulcer 07/30/2020  . Urinary frequency 09/11/2020  . Gastroesophageal reflux disease without esophagitis 09/16/2020  . Acute on chronic anemia 09/21/2020  . Acute encephalopathy 12/09/2020  . Hyperbilirubinemia 12/09/2020  . Hypothyroidism 12/09/2020  . Memory  changes 10/20/2020  . Age-related physical debility 03/22/2018   Resolved Ambulatory Problems    Diagnosis Date Noted  . ONYCHOMYCOSIS, TOENAILS 04/03/2009  . COLONIC POLYPS, HYPERPLASTIC 02/16/2007  . OBESITY, NOS 10/19/2006  . Diabetic neuropathy associated with type 2 diabetes mellitus ([place]) 04/03/2009  . GLAUCOMA 10/19/2006  . ALOPECIA 11/02/2007  . OSTEOARTHRITIS, LOWER LEG 10/19/2006  . Pruritus 03/17/2011  . Foul smelling urine 03/17/2011  . Ingrown hair 07/05/2011  . Vertigo-Dizzy, vague feeling 11/16/2011  . Heart palpitations 11/16/2011  . Groin pain 08/06/2012  . Tenesmus (rectal) 12/21/2012  . Cough 01/31/2013  . Sciatic pain 01/01/2014  . Acute lower UTI 07/03/2014  . Dark stools 11/24/2014  . Piriformis syndrome of right side 01/23/2015  . Family hx of colon cancer 07/23/2015  . IDDM (insulin dependent diabetes mellitus) 07/23/2015  . Abnormal sensation of buttocks 03/25/2016  . Nausea without vomiting 03/16/2017  . External hemorrhoids 03/16/2017  . Vaginal mass 12/22/2017  . Hematuria 01/29/2018  . Cerumen impaction 10/18/2018  . Abnormal urogenital discharge 10/31/2019  . Vaginal candidiasis 06/21/2020  . Atrophic vaginitis 06/21/2020  . Metabolic acidosis 51/88/4166  . UTI (urinary tract infection) 07/02/2020  . Type 2 diabetes mellitus with hyperosmolar hyperglycemic state (HHS) ([place]) 07/02/2020  . Hyperkalemia 07/02/2020  . Polyuria 09/09/2020  . Bladder irritation 09/11/2020   Past Medical History:  Diagnosis Date  . Arthritis   . Arthritis   . Cataract   . Chronic kidney disease   . Diabetes mellitus   . Glaucoma   . Hyperlipidemia   . Hyperplastic colon polyp   . Hypertension   . Syncope   . Thyroid disease    SOCIAL HX:  Social History   Tobacco Use  . Smoking status: Former Smoker    Types: Cigarettes    Quit date: 11/29/1980    Years since quitting: 40.1  . Smokeless tobacco: Never Used  Substance Use Topics  . Alcohol use: No     Alcohol/week: 0.0 standard drinks   FAMILY HX:  Family History  Problem Relation Age of Onset  . Colon cancer Mother   . Other Father        cerebral hemorrhage  . Diabetes Brother   . Diabetes Sister        x 2  . Bone cancer Daughter   . Breast cancer Daughter 46  . Esophageal cancer Neg Hx   . Rectal cancer Neg Hx   . Stomach cancer Neg Hx       ALLERGIES:  Allergies  Allergen Reactions  . Levemir [Insulin Detemir] Itching     PERTINENT MEDICATIONS:  Outpatient Encounter Medications as of 01/13/2021  Medication Sig  . ACCU-CHEK AVIVA  PLUS test strip USE TO TEST BLOOD SUGAR UP TO 3 TIMES A DAY.  Marland Kitchen Accu-Chek Softclix Lancets lancets USE TO TEST BLOOD SUGAR UP TO 3 TIMES A DAY.  Marland Kitchen Alcohol Swabs PADS 1 Piece by Does not apply route daily.  Marland Kitchen aspirin 81 MG chewable tablet Chew 81 mg by mouth every morning.  Marland Kitchen atorvastatin (LIPITOR) 40 MG tablet Take 1 tablet (40 mg total) by mouth daily.  . brimonidine (ALPHAGAN P) 0.1 % SOLN Place 1 drop into both eyes 2 (two) times daily.  . carbamide peroxide (DEBROX) 6.5 % OTIC solution Place 5 drops into both ears 2 (two) times daily. (Patient taking differently: Place 2 drops into both ears 2 (two) times daily as needed (ear wax).)  . Catheter Self-Adhesive Urinary MISC 1 patch by Does not apply route daily.  Marland Kitchen docusate sodium (COLACE) 100 MG capsule Take 1 capsule (100 mg total) by mouth 2 (two) times daily.  . dorzolamide-timolol (COSOPT) 22.3-6.8 MG/ML ophthalmic solution Place 1 drop into both eyes 2 (two) times daily.  . DULoxetine (CYMBALTA) 60 MG capsule TAKE 1 CAPSULE BY MOUTH EVERY DAY (Patient taking differently: Take 60 mg by mouth daily.)  . enalapril (VASOTEC) 20 MG tablet Take 1 tablet (20 mg total) by mouth daily.  . famotidine (PEPCID) 20 MG tablet Take 1 tablet (20 mg total) by mouth daily. If needed can take twice a day.  . ferrous sulfate 325 (65 FE) MG tablet Take 325 mg by mouth daily with breakfast.  . insulin  glargine (LANTUS) 100 UNIT/ML injection Inject 0.15 mLs (15 Units total) into the skin daily.  Marland Kitchen ketoconazole (NIZORAL) 2 % cream Apply 1 application topically daily. (Patient taking differently: Apply 1 application topically daily as needed for irritation.)  . levothyroxine (SYNTHROID) 50 MCG tablet Take 1 tablet (50 mcg total) by mouth daily.  . metFORMIN (GLUCOPHAGE-XR) 500 MG 24 hr tablet Take 2 tablets (1,000 mg total) by mouth every morning.  . mirabegron ER (MYRBETRIQ) 25 MG TB24 tablet Take 1 tablet (25 mg total) by mouth daily.  . Multiple Vitamins-Minerals (PRESERVISION AREDS 2+MULTI VIT) CAPS Take 1 capsule by mouth daily.  . ondansetron (ZOFRAN ODT) 4 MG disintegrating tablet Take 1 tablet (4 mg total) by mouth every 8 (eight) hours as needed for nausea.  . polyethylene glycol powder (GLYCOLAX/MIRALAX) 17 GM/SCOOP powder Take 17 g by mouth daily.  . Semaglutide (RYBELSUS) 3 MG TABS Take 3 mg by mouth daily.  Haig Prophet COMFORT INSULIN SYRINGE 31G X 5/16" 0.3 ML MISC use 1 syringe with insulin daily  . tamsulosin (FLOMAX) 0.4 MG CAPS capsule Take 0.4 mg by mouth daily.  Marland Kitchen VYZULTA 0.024 % SOLN Place 1 drop into both eyes at bedtime.  Marland Kitchen witch hazel-glycerin (TUCKS) pad Apply topically as needed for itching.   No facility-administered encounter medications on file as of 01/13/2021.    Thank you for the opportunity to participate in the care of Ms. Conrow.  The palliative care team will continue to follow. Please call our office at [phone] if we can be of additional assistance.   Anzlee Hinesley Jenetta Downer, NP , DNP  This chart was dictated using voice recognition software. Despite best efforts to proofread, errors can occur which can change the documentation meaning.   COVID-19 PATIENT SCREENING TOOL Asked and negative response unless otherwise noted:   Have you had symptoms of covid, tested positive or been in contact with someone with symptoms/positive test in the past 5-10 days?  Negative

## 2021-01-14 ENCOUNTER — Emergency Department (HOSPITAL_COMMUNITY): Payer: Medicare Other

## 2021-01-14 ENCOUNTER — Encounter (HOSPITAL_COMMUNITY): Payer: Self-pay | Admitting: Emergency Medicine

## 2021-01-14 ENCOUNTER — Inpatient Hospital Stay (HOSPITAL_COMMUNITY)
Admission: EM | Admit: 2021-01-14 | Discharge: 2021-01-21 | DRG: 871 | Disposition: A | Discharge status: Skilled Nursing Facility | Payer: Medicare Other | Source: Skilled Nursing Facility | Attending: Internal Medicine | Admitting: Internal Medicine

## 2021-01-14 DIAGNOSIS — I82412 Acute embolism and thrombosis of left femoral vein: Secondary | ICD-10-CM | POA: Diagnosis not present

## 2021-01-14 DIAGNOSIS — E118 Type 2 diabetes mellitus with unspecified complications: Secondary | ICD-10-CM | POA: Diagnosis not present

## 2021-01-14 DIAGNOSIS — E785 Hyperlipidemia, unspecified: Secondary | ICD-10-CM | POA: Diagnosis present

## 2021-01-14 DIAGNOSIS — R404 Transient alteration of awareness: Secondary | ICD-10-CM | POA: Diagnosis not present

## 2021-01-14 DIAGNOSIS — D649 Anemia, unspecified: Secondary | ICD-10-CM | POA: Diagnosis not present

## 2021-01-14 DIAGNOSIS — I959 Hypotension, unspecified: Secondary | ICD-10-CM | POA: Diagnosis present

## 2021-01-14 DIAGNOSIS — M6281 Muscle weakness (generalized): Secondary | ICD-10-CM | POA: Diagnosis not present

## 2021-01-14 DIAGNOSIS — R410 Disorientation, unspecified: Secondary | ICD-10-CM | POA: Diagnosis not present

## 2021-01-14 DIAGNOSIS — E1165 Type 2 diabetes mellitus with hyperglycemia: Secondary | ICD-10-CM | POA: Diagnosis present

## 2021-01-14 DIAGNOSIS — L98421 Non-pressure chronic ulcer of back limited to breakdown of skin: Secondary | ICD-10-CM | POA: Diagnosis present

## 2021-01-14 DIAGNOSIS — R339 Retention of urine, unspecified: Secondary | ICD-10-CM | POA: Diagnosis not present

## 2021-01-14 DIAGNOSIS — Z888 Allergy status to other drugs, medicaments and biological substances status: Secondary | ICD-10-CM

## 2021-01-14 DIAGNOSIS — R4182 Altered mental status, unspecified: Secondary | ICD-10-CM | POA: Diagnosis not present

## 2021-01-14 DIAGNOSIS — Z7984 Long term (current) use of oral hypoglycemic drugs: Secondary | ICD-10-CM

## 2021-01-14 DIAGNOSIS — I129 Hypertensive chronic kidney disease with stage 1 through stage 4 chronic kidney disease, or unspecified chronic kidney disease: Secondary | ICD-10-CM | POA: Diagnosis not present

## 2021-01-14 DIAGNOSIS — F32A Depression, unspecified: Secondary | ICD-10-CM | POA: Diagnosis not present

## 2021-01-14 DIAGNOSIS — E1122 Type 2 diabetes mellitus with diabetic chronic kidney disease: Secondary | ICD-10-CM | POA: Diagnosis not present

## 2021-01-14 DIAGNOSIS — N179 Acute kidney failure, unspecified: Secondary | ICD-10-CM | POA: Diagnosis not present

## 2021-01-14 DIAGNOSIS — N189 Chronic kidney disease, unspecified: Secondary | ICD-10-CM | POA: Diagnosis present

## 2021-01-14 DIAGNOSIS — I82403 Acute embolism and thrombosis of unspecified deep veins of lower extremity, bilateral: Secondary | ICD-10-CM | POA: Diagnosis not present

## 2021-01-14 DIAGNOSIS — Z87440 Personal history of urinary (tract) infections: Secondary | ICD-10-CM

## 2021-01-14 DIAGNOSIS — Z79899 Other long term (current) drug therapy: Secondary | ICD-10-CM

## 2021-01-14 DIAGNOSIS — R531 Weakness: Secondary | ICD-10-CM

## 2021-01-14 DIAGNOSIS — N390 Urinary tract infection, site not specified: Secondary | ICD-10-CM | POA: Diagnosis not present

## 2021-01-14 DIAGNOSIS — Z7189 Other specified counseling: Secondary | ICD-10-CM

## 2021-01-14 DIAGNOSIS — F0390 Unspecified dementia without behavioral disturbance: Secondary | ICD-10-CM | POA: Diagnosis present

## 2021-01-14 DIAGNOSIS — E039 Hypothyroidism, unspecified: Secondary | ICD-10-CM | POA: Diagnosis not present

## 2021-01-14 DIAGNOSIS — Z79890 Hormone replacement therapy (postmenopausal): Secondary | ICD-10-CM

## 2021-01-14 DIAGNOSIS — R278 Other lack of coordination: Secondary | ICD-10-CM | POA: Diagnosis not present

## 2021-01-14 DIAGNOSIS — K5900 Constipation, unspecified: Secondary | ICD-10-CM | POA: Diagnosis not present

## 2021-01-14 DIAGNOSIS — R627 Adult failure to thrive: Secondary | ICD-10-CM | POA: Diagnosis present

## 2021-01-14 DIAGNOSIS — A419 Sepsis, unspecified organism: Secondary | ICD-10-CM | POA: Diagnosis not present

## 2021-01-14 DIAGNOSIS — E11649 Type 2 diabetes mellitus with hypoglycemia without coma: Secondary | ICD-10-CM | POA: Diagnosis not present

## 2021-01-14 DIAGNOSIS — R6520 Severe sepsis without septic shock: Secondary | ICD-10-CM | POA: Diagnosis not present

## 2021-01-14 DIAGNOSIS — R488 Other symbolic dysfunctions: Secondary | ICD-10-CM | POA: Diagnosis not present

## 2021-01-14 DIAGNOSIS — D638 Anemia in other chronic diseases classified elsewhere: Secondary | ICD-10-CM | POA: Diagnosis not present

## 2021-01-14 DIAGNOSIS — Z800 Family history of malignant neoplasm of digestive organs: Secondary | ICD-10-CM

## 2021-01-14 DIAGNOSIS — Z833 Family history of diabetes mellitus: Secondary | ICD-10-CM

## 2021-01-14 DIAGNOSIS — G928 Other toxic encephalopathy: Secondary | ICD-10-CM | POA: Diagnosis present

## 2021-01-14 DIAGNOSIS — E1169 Type 2 diabetes mellitus with other specified complication: Secondary | ICD-10-CM | POA: Diagnosis not present

## 2021-01-14 DIAGNOSIS — L89151 Pressure ulcer of sacral region, stage 1: Secondary | ICD-10-CM | POA: Diagnosis not present

## 2021-01-14 DIAGNOSIS — Z743 Need for continuous supervision: Secondary | ICD-10-CM | POA: Diagnosis not present

## 2021-01-14 DIAGNOSIS — R413 Other amnesia: Secondary | ICD-10-CM | POA: Diagnosis present

## 2021-01-14 DIAGNOSIS — G9340 Encephalopathy, unspecified: Secondary | ICD-10-CM | POA: Diagnosis not present

## 2021-01-14 DIAGNOSIS — R609 Edema, unspecified: Secondary | ICD-10-CM | POA: Diagnosis not present

## 2021-01-14 DIAGNOSIS — J9811 Atelectasis: Secondary | ICD-10-CM | POA: Diagnosis not present

## 2021-01-14 DIAGNOSIS — Z20822 Contact with and (suspected) exposure to covid-19: Secondary | ICD-10-CM | POA: Diagnosis present

## 2021-01-14 DIAGNOSIS — Z7982 Long term (current) use of aspirin: Secondary | ICD-10-CM

## 2021-01-14 DIAGNOSIS — Z515 Encounter for palliative care: Secondary | ICD-10-CM

## 2021-01-14 DIAGNOSIS — Z8719 Personal history of other diseases of the digestive system: Secondary | ICD-10-CM

## 2021-01-14 DIAGNOSIS — I10 Essential (primary) hypertension: Secondary | ICD-10-CM | POA: Diagnosis not present

## 2021-01-14 DIAGNOSIS — Z803 Family history of malignant neoplasm of breast: Secondary | ICD-10-CM

## 2021-01-14 DIAGNOSIS — Z794 Long term (current) use of insulin: Secondary | ICD-10-CM

## 2021-01-14 DIAGNOSIS — Z90710 Acquired absence of both cervix and uterus: Secondary | ICD-10-CM

## 2021-01-14 DIAGNOSIS — Z87891 Personal history of nicotine dependence: Secondary | ICD-10-CM

## 2021-01-14 DIAGNOSIS — R6889 Other general symptoms and signs: Secondary | ICD-10-CM | POA: Diagnosis not present

## 2021-01-14 DIAGNOSIS — E440 Moderate protein-calorie malnutrition: Secondary | ICD-10-CM | POA: Diagnosis present

## 2021-01-14 DIAGNOSIS — H409 Unspecified glaucoma: Secondary | ICD-10-CM | POA: Diagnosis present

## 2021-01-14 LAB — CBC WITH DIFFERENTIAL/PLATELET
Abs Immature Granulocytes: 0.1 10*3/uL — ABNORMAL HIGH (ref 0.00–0.07)
Basophils Absolute: 0 10*3/uL (ref 0.0–0.1)
Basophils Relative: 0 %
Eosinophils Absolute: 0.1 10*3/uL (ref 0.0–0.5)
Eosinophils Relative: 2 %
HCT: 24 % — ABNORMAL LOW (ref 36.0–46.0)
Hemoglobin: 7.4 g/dL — ABNORMAL LOW (ref 12.0–15.0)
Immature Granulocytes: 2 %
Lymphocytes Relative: 8 %
Lymphs Abs: 0.6 10*3/uL — ABNORMAL LOW (ref 0.7–4.0)
MCH: 30.3 pg (ref 26.0–34.0)
MCHC: 30.8 g/dL (ref 30.0–36.0)
MCV: 98.4 fL (ref 80.0–100.0)
Monocytes Absolute: 0.5 10*3/uL (ref 0.1–1.0)
Monocytes Relative: 7 %
Neutro Abs: 5.5 10*3/uL (ref 1.7–7.7)
Neutrophils Relative %: 81 %
Platelets: 362 10*3/uL (ref 150–400)
RBC: 2.44 MIL/uL — ABNORMAL LOW (ref 3.87–5.11)
RDW: 16 % — ABNORMAL HIGH (ref 11.5–15.5)
WBC: 6.8 10*3/uL (ref 4.0–10.5)
nRBC: 0 % (ref 0.0–0.2)

## 2021-01-14 LAB — COMPREHENSIVE METABOLIC PANEL
ALT: 9 U/L (ref 0–44)
AST: 16 U/L (ref 15–41)
Albumin: 2.4 g/dL — ABNORMAL LOW (ref 3.5–5.0)
Alkaline Phosphatase: 87 U/L (ref 38–126)
Anion gap: 11 (ref 5–15)
BUN: 38 mg/dL — ABNORMAL HIGH (ref 8–23)
CO2: 22 mmol/L (ref 22–32)
Calcium: 8.5 mg/dL — ABNORMAL LOW (ref 8.9–10.3)
Chloride: 100 mmol/L (ref 98–111)
Creatinine, Ser: 1 mg/dL (ref 0.44–1.00)
GFR, Estimated: 55 mL/min — ABNORMAL LOW (ref >60)
Glucose, Bld: 92 mg/dL (ref 70–99)
Potassium: 4.6 mmol/L (ref 3.5–5.1)
Sodium: 133 mmol/L — ABNORMAL LOW (ref 135–145)
Total Bilirubin: 1.2 mg/dL (ref 0.3–1.2)
Total Protein: 6 g/dL — ABNORMAL LOW (ref 6.5–8.1)

## 2021-01-14 LAB — URINALYSIS, ROUTINE W REFLEX MICROSCOPIC
Bacteria, UA: NONE SEEN
Bilirubin Urine: NEGATIVE
Glucose, UA: 50 mg/dL — AB
Ketones, ur: NEGATIVE mg/dL
Nitrite: NEGATIVE
Protein, ur: 100 mg/dL — AB
Specific Gravity, Urine: 1.016 (ref 1.005–1.030)
pH: 5 (ref 5.0–8.0)

## 2021-01-14 LAB — RESP PANEL BY RT-PCR (FLU A&B, COVID) ARPGX2
Influenza A by PCR: NEGATIVE
Influenza B by PCR: NEGATIVE
SARS Coronavirus 2 by RT PCR: NEGATIVE

## 2021-01-14 LAB — LIPASE, BLOOD: Lipase: 21 U/L (ref 11–51)

## 2021-01-14 LAB — LACTIC ACID, PLASMA: Lactic Acid, Venous: 4 mmol/L (ref 0.5–1.9)

## 2021-01-14 LAB — TSH: TSH: 1.173 u[IU]/mL (ref 0.350–4.500)

## 2021-01-14 LAB — POC OCCULT BLOOD, ED: Fecal Occult Bld: NEGATIVE

## 2021-01-14 MED ORDER — LACTATED RINGERS IV BOLUS (SEPSIS)
1000.0000 mL | Freq: Once | INTRAVENOUS | Status: AC
  Administered 2021-01-14: 1000 mL via INTRAVENOUS

## 2021-01-14 MED ORDER — SODIUM CHLORIDE 0.9 % IV SOLN
1.0000 g | Freq: Once | INTRAVENOUS | Status: DC
  Filled 2021-01-14: qty 10

## 2021-01-14 MED ORDER — VANCOMYCIN HCL IN DEXTROSE 1-5 GM/200ML-% IV SOLN
1000.0000 mg | Freq: Once | INTRAVENOUS | Status: DC
  Filled 2021-01-14: qty 200

## 2021-01-14 MED ORDER — METRONIDAZOLE 500 MG/100ML IV SOLN
500.0000 mg | Freq: Once | INTRAVENOUS | Status: AC
  Administered 2021-01-14: 500 mg via INTRAVENOUS
  Filled 2021-01-14: qty 100

## 2021-01-14 MED ORDER — SODIUM CHLORIDE 0.9 % IV SOLN: INTRAVENOUS | Status: DC

## 2021-01-14 MED ORDER — VANCOMYCIN HCL 1500 MG/300ML IV SOLN
1500.0000 mg | Freq: Once | INTRAVENOUS | Status: AC
  Administered 2021-01-15: 1500 mg via INTRAVENOUS
  Filled 2021-01-14: qty 300

## 2021-01-14 MED ORDER — SODIUM CHLORIDE 0.9 % IV SOLN: 10.0000 mL/h | Freq: Once | INTRAVENOUS | Status: DC

## 2021-01-14 MED ORDER — LACTATED RINGERS IV SOLN: INTRAVENOUS | Status: DC

## 2021-01-14 MED ORDER — SODIUM CHLORIDE 0.9 % IV SOLN
2.0000 g | Freq: Once | INTRAVENOUS | Status: AC
  Administered 2021-01-14: 2 g via INTRAVENOUS
  Filled 2021-01-14: qty 2

## 2021-01-14 MED ORDER — SODIUM CHLORIDE 0.9 % IV BOLUS
500.0000 mL | Freq: Once | INTRAVENOUS | Status: AC
  Administered 2021-01-14: 500 mL via INTRAVENOUS

## 2021-01-14 NOTE — ED Provider Notes (Signed)
[facility] DEPT Provider Note   CSN: [id number] Arrival date & time: 01/14/21  1825     History Chief Complaint  Patient presents with  . Failure To Thrive  . Weakness    Hayley Fisher is a 87 y.o. female.  Patient is brought in by EMS from [facility].  Staff reports decreased p.o. intake, left-sided weakness, and decreased level of consciousness for 1 week.  Apparently patient had a Foley placed on May 24 being treated with an antibiotic for urinary tract infection and seems to be Macrobid.  Upon arrival here patient seems to be alert and oriented able to say where she is from.  EMS got the report that she was actually at her baseline.  Temp upon arrival was 97.7.  Heart rate was 100 respirations 16 blood pressure 94/54 but repeat blood pressure was 101/51.  Oxygen saturation was 96-98% on room air.  Patient denies any abdominal pain chest pain shortness of breath.  Any pain with urination.  Patient is lying on her left side in fetal position.  But she will move her feet and move her arms.        Past Medical History:  Diagnosis Date  . Arthritis   . Arthritis   . Cataract    bil cateracts removed  . Chronic kidney disease    "spot on one of my kidneys" per pt  . Diabetes mellitus   . Glaucoma   . Hyperkalemia 07/02/2020  . Hyperlipidemia   . Hyperplastic colon polyp   . Hypertension   . Hypothyroidism 12/09/2020  . Metabolic acidosis 61/44/3154  . Syncope   . Thyroid disease     Patient Active Problem List   Diagnosis Date Noted  . Acute encephalopathy 12/09/2020  . Hyperbilirubinemia 12/09/2020  . Hypothyroidism 12/09/2020  . Memory changes 10/20/2020  . Acute on chronic anemia 09/21/2020  . Gastroesophageal reflux disease without esophagitis 09/16/2020  . Urinary frequency 09/11/2020  . Stage 1 decubitus ulcer 07/30/2020  . Uncontrolled type 2 diabetes mellitus with hyperglycemia ([place]) 07/02/2020  . Unstable gait 04/03/2018   . Frequent falls 04/03/2018  . Age-related physical debility 03/22/2018  . Skin ulcer of sacrum, limited to breakdown of skin ([place]) 03/16/2017  . Spinal stenosis of lumbar region 01/13/2016  . Chronic venous insufficiency 11/09/2015  . DDD (degenerative disc disease), lumbosacral 11/24/2014  . Constipation by delayed colonic transit 11/24/2014  . Frequent UTI 10/29/2012  . Type 2 diabetes mellitus with diabetic neuropathy ([place]) 10/19/2006  . Hyperlipidemia associated with type 2 diabetes mellitus ([place]) 10/19/2006  . Hypertension associated with diabetes ([place]) 10/19/2006  . Osteopenia 10/19/2006    Past Surgical History:  Procedure Laterality Date  . ABDOMINAL HYSTERECTOMY    . BLADDER SUSPENSION    . bladder tacking    . COLONOSCOPY    . EYE SURGERY    . INGUINAL HERNIA REPAIR Right      OB History   No obstetric history on file.     Family History  Problem Relation Age of Onset  . Colon cancer Mother   . Other Father        cerebral hemorrhage  . Diabetes Brother   . Diabetes Sister        x 2  . Bone cancer Daughter   . Breast cancer Daughter 22  . Esophageal cancer Neg Hx   . Rectal cancer Neg Hx   . Stomach cancer Neg Hx     Social History  Tobacco Use  . Smoking status: Former Smoker    Types: Cigarettes    Quit date: 11/29/1980    Years since quitting: 40.1  . Smokeless tobacco: Never Used  Vaping Use  . Vaping Use: Never used  Substance Use Topics  . Alcohol use: No    Alcohol/week: 0.0 standard drinks  . Drug use: No    Home Medications Prior to Admission medications   Medication Sig Start Date End Date Taking? Authorizing Provider  ACCU-CHEK AVIVA PLUS test strip USE TO TEST BLOOD SUGAR UP TO 3 TIMES A DAY. 06/30/20   Mullis, Kiersten P, DO  Accu-Chek Softclix Lancets lancets USE TO TEST BLOOD SUGAR UP TO 3 TIMES A DAY. 06/30/20   Mullis, Kiersten P, DO  Alcohol Swabs PADS 1 Piece by Does not apply route daily. 08/10/20   Mullis, Kiersten P,  DO  aspirin 81 MG chewable tablet Chew 81 mg by mouth every morning.    [provider]  atorvastatin (LIPITOR) 40 MG tablet Take 1 tablet (40 mg total) by mouth daily. 09/21/20   Mullis, Kiersten P, DO  brimonidine (ALPHAGAN P) 0.1 % SOLN Place 1 drop into both eyes 2 (two) times daily. 09/20/11     carbamide peroxide (DEBROX) 6.5 % OTIC solution Place 5 drops into both ears 2 (two) times daily. Patient taking differently: Place 2 drops into both ears 2 (two) times daily as needed (ear wax). 10/16/18   Alveda Reasons, MD  Catheter Self-Adhesive Urinary MISC 1 patch by Does not apply route daily. 10/26/20   Anderson, Chelsey L, DO  docusate sodium (COLACE) 100 MG capsule Take 1 capsule (100 mg total) by mouth 2 (two) times daily. 12/15/20   Harold Hedge, MD  dorzolamide-timolol (COSOPT) 22.3-6.8 MG/ML ophthalmic solution Place 1 drop into both eyes 2 (two) times daily. 05/18/20   [provider]  DULoxetine (CYMBALTA) 60 MG capsule TAKE 1 CAPSULE BY MOUTH EVERY DAY Patient taking differently: Take 60 mg by mouth daily. 10/22/18   Harriet Butte, DO  enalapril (VASOTEC) 20 MG tablet Take 1 tablet (20 mg total) by mouth daily. 09/21/20   Mullis, Kiersten P, DO  famotidine (PEPCID) 20 MG tablet Take 1 tablet (20 mg total) by mouth daily. If needed can take twice a day. 09/16/20   Mullis, Kiersten P, DO  ferrous sulfate 325 (65 FE) MG tablet Take 325 mg by mouth daily with breakfast.    [provider]  insulin glargine (LANTUS) 100 UNIT/ML injection Inject 0.15 mLs (15 Units total) into the skin daily. 12/15/20   Harold Hedge, MD  ketoconazole (NIZORAL) 2 % cream Apply 1 application topically daily. Patient taking differently: Apply 1 application topically daily as needed for irritation. 11/19/20   Criselda Peaches, DPM  levothyroxine (SYNTHROID) 50 MCG tablet Take 1 tablet (50 mcg total) by mouth daily. 11/10/20   Renato Shin, MD  metFORMIN (GLUCOPHAGE-XR) 500 MG 24 hr tablet  Take 2 tablets (1,000 mg total) by mouth every morning. 11/09/20   Renato Shin, MD  mirabegron ER (MYRBETRIQ) 25 MG TB24 tablet Take 1 tablet (25 mg total) by mouth daily. 09/16/20   Mullis, Kiersten P, DO  Multiple Vitamins-Minerals (PRESERVISION AREDS 2+MULTI VIT) CAPS Take 1 capsule by mouth daily.    [provider]  ondansetron (ZOFRAN ODT) 4 MG disintegrating tablet Take 1 tablet (4 mg total) by mouth every 8 (eight) hours as needed for nausea. 07/28/20   Larene Pickett, PA-C  polyethylene  glycol powder (GLYCOLAX/MIRALAX) 17 GM/SCOOP powder Take 17 g by mouth daily. 12/15/20   Harold Hedge, MD  Semaglutide (RYBELSUS) 3 MG TABS Take 3 mg by mouth daily. 11/09/20   Renato Shin, MD  SURE COMFORT INSULIN SYRINGE 31G X 5/16" 0.3 ML MISC use 1 syringe with insulin daily 11/12/20   Mullis, Kiersten P, DO  tamsulosin (FLOMAX) 0.4 MG CAPS capsule Take 0.4 mg by mouth daily. 10/08/20   [provider]  VYZULTA 0.024 % SOLN Place 1 drop into both eyes at bedtime. 11/06/17   [provider]  witch hazel-glycerin (TUCKS) pad Apply topically as needed for itching. 12/15/20   Harold Hedge, MD    Allergies    Levemir [insulin detemir]  Review of Systems   Review of Systems  Constitutional: Negative for chills and fever.  HENT: Negative for ear pain and sore throat.   Eyes: Negative for pain and visual disturbance.  Respiratory: Negative for cough and shortness of breath.   Cardiovascular: Negative for chest pain and palpitations.  Gastrointestinal: Negative for abdominal pain and vomiting.  Genitourinary: Negative for dysuria and hematuria.  Musculoskeletal: Negative for arthralgias and back pain.  Skin: Negative for color change and rash.  Neurological: Negative for seizures and syncope.  Psychiatric/Behavioral: Positive for confusion.  All other systems reviewed and are negative.   Physical Exam Updated Vital Signs BP (!) 105/54   Pulse 97   Temp 97.7 F (36.5  C) (Oral)   Resp 17   SpO2 100%   Physical Exam Vitals and nursing note reviewed.  Constitutional:      General: She is not in acute distress.    Appearance: Normal appearance. She is well-developed. She is not ill-appearing.  HENT:     Head: Normocephalic and atraumatic.     Mouth/Throat:     Mouth: Mucous membranes are dry.  Eyes:     Extraocular Movements: Extraocular movements intact.     Conjunctiva/sclera: Conjunctivae normal.     Pupils: Pupils are equal, round, and reactive to light.  Cardiovascular:     Rate and Rhythm: Normal rate and regular rhythm.     Heart sounds: No murmur heard.   Pulmonary:     Effort: Pulmonary effort is normal. No respiratory distress.     Breath sounds: Normal breath sounds.  Abdominal:     Palpations: Abdomen is soft.     Tenderness: There is no abdominal tenderness.  Genitourinary:    Comments: Foley catheter in place.  Urine is dark.  And at the proximal part of the tubing it seems to be purulence. Musculoskeletal:     Cervical back: Normal range of motion and neck supple. No rigidity.  Skin:    General: Skin is warm and dry.     Capillary Refill: Capillary refill takes 2 to 3 seconds.  Neurological:     General: No focal deficit present.     Mental Status: She is alert.     Cranial Nerves: No cranial nerve deficit.     Sensory: No sensory deficit.     Comments: Patient without any significant focal deficits.  Will move arms and fingers and hands and move her feet.  No cranial nerve deficits are obvious.  No facial droop.     ED Results / Procedures / Treatments   Labs (all labs ordered are listed, but only abnormal results are displayed) Labs Reviewed  URINE CULTURE  CULTURE, BLOOD (ROUTINE X 2)  CULTURE, BLOOD (ROUTINE X 2)  RESP PANEL BY RT-PCR (FLU A&B, COVID) ARPGX2  CBC WITH DIFFERENTIAL/PLATELET  COMPREHENSIVE METABOLIC PANEL  URINALYSIS, ROUTINE W REFLEX MICROSCOPIC  LIPASE, BLOOD  LACTIC ACID, PLASMA  TSH     EKG EKG Interpretation  Date/Time:  Thursday Jan 14 2021 22:13:39 EDT Ventricular Rate:  94 PR Interval:  157 QRS Duration: 85 QT Interval:  360 QTC Calculation: 451 R Axis:   8 Text Interpretation: Sinus rhythm Atrial premature complex Probable left atrial enlargement Inferior infarct, old No significant change since last tracing Confirmed by Fredia Sorrow [phone]) on 01/14/2021 10:49:05 PM   Radiology DG Chest Port 1 View  Result Date: 01/14/2021 CLINICAL DATA:  Failure to thrive EXAM: PORTABLE CHEST 1 VIEW COMPARISON:  12/01/2020 FINDINGS: Lung volumes are small and there is bibasilar atelectasis, unchanged from prior examination. No pneumothorax or pleural effusion. Cardiac size within normal limits. Pulmonary vascularity is normal. No acute bone abnormality. IMPRESSION: Stable pulmonary hypoinflation. Electronically Signed   By: Fidela Salisbury MD   On: 01/14/2021 22:26    Procedures Procedures   Medications Ordered in ED Medications  0.9 %  sodium chloride infusion ( Intravenous New Bag/Given 01/14/21 2214)  sodium chloride 0.9 % bolus 500 mL (500 mLs Intravenous New Bag/Given (Non-Interop) 01/14/21 2213)    ED Course  I have reviewed the triage vital signs and the nursing notes.  Pertinent labs & imaging results that were available during my care of the patient were reviewed by me and considered in my medical decision making (see chart for details).    MDM Rules/Calculators/A&P                          Patient here without any significant focal deficit.  Seems to be fairly alert with eyes have a complaints.  On exam it does appear that there is purulent urine concerning for urinary tract infection.  Patient apparently been on Macrobid.  Urine will be sent for culture blood culture sent.  Lactic acid sent.  Patient has a history of hypothyroidism so TSH has been ordered.  Also work-up will include chest x-ray and CT head based on the nursing home concerns.  Patient  possibly could be early sepsis.  Based on a little bit of tachycardia and that initial blood pressure was 94/54.  But then repeats have been systolics in the 952.  Chest x-ray shows no evidence of any acute abnormalities.  EKG without any significant changes from old EKGs.  Patient will be turned over to the overnight physician Dr. Kingsley Callander to follow-up on the labs and head CT results.      Final Clinical Impression(s) / ED Diagnoses Final diagnoses:  Weakness    Rx / DC Orders ED Discharge Orders    None       Fredia Sorrow, MD 01/14/21 2309

## 2021-01-14 NOTE — ED Notes (Signed)
ED Provider at bedside. 

## 2021-01-14 NOTE — ED Triage Notes (Signed)
Pt BIB EMS from [facility]. Staff reports decreased intake, left sided weakness, and decreased LOC x 1 week. Foley placed 5/24. Pt already being tx with abx for UTI. Oriented to baseline, A&O x 2.

## 2021-01-14 NOTE — Progress Notes (Signed)
A consult was received from an ED physician for Cefepime + Vancomycin per pharmacy dosing.  The patient's profile has been reviewed for ht/wt/allergies/indication/available labs.   A one time order has been placed for Cefepime 2gm IV + Vancomycin 1500mg  IV.  Further antibiotics/pharmacy consults should be ordered by admitting physician if indicated.                       Thank you, Netta Cedars PharmD 01/14/2021  11:41 PM

## 2021-01-14 NOTE — ED Provider Notes (Signed)
Care assumed from Dr. Rogene Houston. Patient with decreased p.o. intake, known UTI with Foley in place.  His failure to thrive at home with decreased activity level and decreased mentation.  Patient being treated for suspected sepsis.  Lactate is 4.  Patient started on broad-spectrum antibiotics and IV fluids  Hemoccult is negative.  Patient was given a blood transfusion by previous provider.  Concern for sepsis likely urinary source.  She is started on broad-spectrum antibiotics and IV fluids after cultures were obtained. Chest x-ray is negative. CT head negative.   Admission discussed with Dr. Roel Cluck.   .Critical Care Performed by: Ezequiel Essex, MD Authorized by: Ezequiel Essex, MD   Critical care provider statement:    Critical care time (minutes):  45   Critical care was necessary to treat or prevent imminent or life-threatening deterioration of the following conditions:  Sepsis   Critical care was time spent personally by me on the following activities:  Discussions with consultants, evaluation of patient's response to treatment, examination of patient, ordering and performing treatments and interventions, ordering and review of laboratory studies, ordering and review of radiographic studies, pulse oximetry, re-evaluation of patient's condition, obtaining history from patient or surrogate and review of old charts      Ezequiel Essex, MD 01/15/21 [phone]

## 2021-01-15 ENCOUNTER — Inpatient Hospital Stay (HOSPITAL_COMMUNITY): Payer: Medicare Other

## 2021-01-15 ENCOUNTER — Encounter (HOSPITAL_COMMUNITY): Payer: Self-pay | Admitting: Internal Medicine

## 2021-01-15 ENCOUNTER — Other Ambulatory Visit: Payer: Self-pay

## 2021-01-15 DIAGNOSIS — Z7189 Other specified counseling: Secondary | ICD-10-CM | POA: Diagnosis not present

## 2021-01-15 DIAGNOSIS — N390 Urinary tract infection, site not specified: Secondary | ICD-10-CM | POA: Diagnosis not present

## 2021-01-15 DIAGNOSIS — E1165 Type 2 diabetes mellitus with hyperglycemia: Secondary | ICD-10-CM | POA: Diagnosis not present

## 2021-01-15 DIAGNOSIS — E440 Moderate protein-calorie malnutrition: Secondary | ICD-10-CM | POA: Diagnosis not present

## 2021-01-15 DIAGNOSIS — I959 Hypotension, unspecified: Secondary | ICD-10-CM | POA: Diagnosis present

## 2021-01-15 DIAGNOSIS — Z95828 Presence of other vascular implants and grafts: Secondary | ICD-10-CM | POA: Diagnosis not present

## 2021-01-15 DIAGNOSIS — I82403 Acute embolism and thrombosis of unspecified deep veins of lower extremity, bilateral: Secondary | ICD-10-CM | POA: Diagnosis present

## 2021-01-15 DIAGNOSIS — N3281 Overactive bladder: Secondary | ICD-10-CM | POA: Diagnosis not present

## 2021-01-15 DIAGNOSIS — R413 Other amnesia: Secondary | ICD-10-CM

## 2021-01-15 DIAGNOSIS — E039 Hypothyroidism, unspecified: Secondary | ICD-10-CM

## 2021-01-15 DIAGNOSIS — R531 Weakness: Secondary | ICD-10-CM | POA: Diagnosis not present

## 2021-01-15 DIAGNOSIS — F32A Depression, unspecified: Secondary | ICD-10-CM | POA: Diagnosis present

## 2021-01-15 DIAGNOSIS — I10 Essential (primary) hypertension: Secondary | ICD-10-CM | POA: Diagnosis not present

## 2021-01-15 DIAGNOSIS — I129 Hypertensive chronic kidney disease with stage 1 through stage 4 chronic kidney disease, or unspecified chronic kidney disease: Secondary | ICD-10-CM | POA: Diagnosis present

## 2021-01-15 DIAGNOSIS — D649 Anemia, unspecified: Secondary | ICD-10-CM | POA: Diagnosis not present

## 2021-01-15 DIAGNOSIS — Z7409 Other reduced mobility: Secondary | ICD-10-CM | POA: Diagnosis not present

## 2021-01-15 DIAGNOSIS — N189 Chronic kidney disease, unspecified: Secondary | ICD-10-CM | POA: Diagnosis present

## 2021-01-15 DIAGNOSIS — K219 Gastro-esophageal reflux disease without esophagitis: Secondary | ICD-10-CM | POA: Diagnosis not present

## 2021-01-15 DIAGNOSIS — R627 Adult failure to thrive: Secondary | ICD-10-CM | POA: Diagnosis not present

## 2021-01-15 DIAGNOSIS — N179 Acute kidney failure, unspecified: Secondary | ICD-10-CM | POA: Diagnosis not present

## 2021-01-15 DIAGNOSIS — E785 Hyperlipidemia, unspecified: Secondary | ICD-10-CM | POA: Diagnosis not present

## 2021-01-15 DIAGNOSIS — E8809 Other disorders of plasma-protein metabolism, not elsewhere classified: Secondary | ICD-10-CM | POA: Diagnosis not present

## 2021-01-15 DIAGNOSIS — I152 Hypertension secondary to endocrine disorders: Secondary | ICD-10-CM | POA: Diagnosis not present

## 2021-01-15 DIAGNOSIS — G9340 Encephalopathy, unspecified: Secondary | ICD-10-CM | POA: Diagnosis not present

## 2021-01-15 DIAGNOSIS — M48061 Spinal stenosis, lumbar region without neurogenic claudication: Secondary | ICD-10-CM | POA: Diagnosis not present

## 2021-01-15 DIAGNOSIS — E1122 Type 2 diabetes mellitus with diabetic chronic kidney disease: Secondary | ICD-10-CM | POA: Diagnosis present

## 2021-01-15 DIAGNOSIS — K5900 Constipation, unspecified: Secondary | ICD-10-CM

## 2021-01-15 DIAGNOSIS — R609 Edema, unspecified: Secondary | ICD-10-CM | POA: Diagnosis not present

## 2021-01-15 DIAGNOSIS — Z743 Need for continuous supervision: Secondary | ICD-10-CM | POA: Diagnosis not present

## 2021-01-15 DIAGNOSIS — F0390 Unspecified dementia without behavioral disturbance: Secondary | ICD-10-CM | POA: Diagnosis present

## 2021-01-15 DIAGNOSIS — D638 Anemia in other chronic diseases classified elsewhere: Secondary | ICD-10-CM | POA: Diagnosis present

## 2021-01-15 DIAGNOSIS — Z20822 Contact with and (suspected) exposure to covid-19: Secondary | ICD-10-CM | POA: Diagnosis present

## 2021-01-15 DIAGNOSIS — Z86718 Personal history of other venous thrombosis and embolism: Secondary | ICD-10-CM | POA: Diagnosis not present

## 2021-01-15 DIAGNOSIS — E1140 Type 2 diabetes mellitus with diabetic neuropathy, unspecified: Secondary | ICD-10-CM | POA: Diagnosis not present

## 2021-01-15 DIAGNOSIS — G928 Other toxic encephalopathy: Secondary | ICD-10-CM | POA: Diagnosis present

## 2021-01-15 DIAGNOSIS — L89151 Pressure ulcer of sacral region, stage 1: Secondary | ICD-10-CM | POA: Diagnosis present

## 2021-01-15 DIAGNOSIS — L98421 Non-pressure chronic ulcer of back limited to breakdown of skin: Secondary | ICD-10-CM

## 2021-01-15 DIAGNOSIS — R4182 Altered mental status, unspecified: Secondary | ICD-10-CM | POA: Diagnosis present

## 2021-01-15 DIAGNOSIS — G9341 Metabolic encephalopathy: Secondary | ICD-10-CM | POA: Diagnosis not present

## 2021-01-15 DIAGNOSIS — E11649 Type 2 diabetes mellitus with hypoglycemia without coma: Secondary | ICD-10-CM | POA: Diagnosis present

## 2021-01-15 DIAGNOSIS — I82409 Acute embolism and thrombosis of unspecified deep veins of unspecified lower extremity: Secondary | ICD-10-CM | POA: Diagnosis not present

## 2021-01-15 DIAGNOSIS — Z515 Encounter for palliative care: Secondary | ICD-10-CM | POA: Diagnosis not present

## 2021-01-15 DIAGNOSIS — A419 Sepsis, unspecified organism: Principal | ICD-10-CM

## 2021-01-15 DIAGNOSIS — M6281 Muscle weakness (generalized): Secondary | ICD-10-CM | POA: Diagnosis not present

## 2021-01-15 DIAGNOSIS — E1169 Type 2 diabetes mellitus with other specified complication: Secondary | ICD-10-CM | POA: Diagnosis not present

## 2021-01-15 DIAGNOSIS — R5381 Other malaise: Secondary | ICD-10-CM | POA: Diagnosis not present

## 2021-01-15 DIAGNOSIS — I82412 Acute embolism and thrombosis of left femoral vein: Secondary | ICD-10-CM | POA: Diagnosis present

## 2021-01-15 DIAGNOSIS — R6520 Severe sepsis without septic shock: Secondary | ICD-10-CM | POA: Diagnosis not present

## 2021-01-15 DIAGNOSIS — H409 Unspecified glaucoma: Secondary | ICD-10-CM | POA: Diagnosis not present

## 2021-01-15 LAB — COMPREHENSIVE METABOLIC PANEL
ALT: 8 U/L (ref 0–44)
AST: 13 U/L — ABNORMAL LOW (ref 15–41)
Albumin: 1.9 g/dL — ABNORMAL LOW (ref 3.5–5.0)
Alkaline Phosphatase: 67 U/L (ref 38–126)
Anion gap: 5 (ref 5–15)
BUN: 29 mg/dL — ABNORMAL HIGH (ref 8–23)
CO2: 25 mmol/L (ref 22–32)
Calcium: 7.5 mg/dL — ABNORMAL LOW (ref 8.9–10.3)
Chloride: 105 mmol/L (ref 98–111)
Creatinine, Ser: 0.61 mg/dL (ref 0.44–1.00)
GFR, Estimated: >60 mL/min (ref >60)
Glucose, Bld: 70 mg/dL (ref 70–99)
Potassium: 3.9 mmol/L (ref 3.5–5.1)
Sodium: 135 mmol/L (ref 135–145)
Total Bilirubin: 1.1 mg/dL (ref 0.3–1.2)
Total Protein: 4.9 g/dL — ABNORMAL LOW (ref 6.5–8.1)

## 2021-01-15 LAB — RETICULOCYTES
Immature Retic Fract: 26.9 % — ABNORMAL HIGH (ref 2.3–15.9)
RBC.: 2.88 MIL/uL — ABNORMAL LOW (ref 3.87–5.11)
Retic Count, Absolute: 68.3 10*3/uL (ref 19.0–186.0)
Retic Ct Pct: 2.4 % (ref 0.4–3.1)

## 2021-01-15 LAB — CBC WITH DIFFERENTIAL/PLATELET
Abs Immature Granulocytes: 0.09 10*3/uL — ABNORMAL HIGH (ref 0.00–0.07)
Basophils Absolute: 0 10*3/uL (ref 0.0–0.1)
Basophils Relative: 0 %
Eosinophils Absolute: 0.1 10*3/uL (ref 0.0–0.5)
Eosinophils Relative: 2 %
HCT: 21.1 % — ABNORMAL LOW (ref 36.0–46.0)
Hemoglobin: 6.6 g/dL — CL (ref 12.0–15.0)
Immature Granulocytes: 2 %
Lymphocytes Relative: 8 %
Lymphs Abs: 0.4 10*3/uL — ABNORMAL LOW (ref 0.7–4.0)
MCH: 30.6 pg (ref 26.0–34.0)
MCHC: 31.3 g/dL (ref 30.0–36.0)
MCV: 97.7 fL (ref 80.0–100.0)
Monocytes Absolute: 0.4 10*3/uL (ref 0.1–1.0)
Monocytes Relative: 7 %
Neutro Abs: 4.7 10*3/uL (ref 1.7–7.7)
Neutrophils Relative %: 81 %
Platelets: 320 10*3/uL (ref 150–400)
RBC: 2.16 MIL/uL — ABNORMAL LOW (ref 3.87–5.11)
RDW: 15.4 % (ref 11.5–15.5)
WBC: 5.7 10*3/uL (ref 4.0–10.5)
nRBC: 0 % (ref 0.0–0.2)

## 2021-01-15 LAB — CBC
HCT: 27.6 % — ABNORMAL LOW (ref 36.0–46.0)
Hemoglobin: 8.9 g/dL — ABNORMAL LOW (ref 12.0–15.0)
MCH: 30.9 pg (ref 26.0–34.0)
MCHC: 32.2 g/dL (ref 30.0–36.0)
MCV: 95.8 fL (ref 80.0–100.0)
Platelets: 260 10*3/uL (ref 150–400)
RBC: 2.88 MIL/uL — ABNORMAL LOW (ref 3.87–5.11)
RDW: 15.6 % — ABNORMAL HIGH (ref 11.5–15.5)
WBC: 5.9 10*3/uL (ref 4.0–10.5)
nRBC: 0 % (ref 0.0–0.2)

## 2021-01-15 LAB — VITAMIN B12: Vitamin B-12: 1049 pg/mL — ABNORMAL HIGH (ref 180–914)

## 2021-01-15 LAB — PROCALCITONIN: Procalcitonin: 0.1 ng/mL

## 2021-01-15 LAB — PROTIME-INR
INR: 1.1 (ref 0.8–1.2)
Prothrombin Time: 13.8 seconds (ref 11.4–15.2)

## 2021-01-15 LAB — APTT: aPTT: 36 seconds (ref 24–36)

## 2021-01-15 LAB — FOLATE: Folate: 9.7 ng/mL (ref >5.9)

## 2021-01-15 LAB — MRSA PCR SCREENING: MRSA by PCR: NEGATIVE

## 2021-01-15 LAB — LACTIC ACID, PLASMA
Lactic Acid, Venous: 3.4 mmol/L (ref 0.5–1.9)
Lactic Acid, Venous: 1.7 mmol/L (ref 0.5–1.9)
Lactic Acid, Venous: 1.6 mmol/L (ref 0.5–1.9)

## 2021-01-15 LAB — CK: Total CK: 27 U/L — ABNORMAL LOW (ref 38–234)

## 2021-01-15 LAB — PREALBUMIN: Prealbumin: 6.5 mg/dL — ABNORMAL LOW (ref 18–38)

## 2021-01-15 LAB — PHOSPHORUS
Phosphorus: 2.4 mg/dL — ABNORMAL LOW (ref 2.5–4.6)
Phosphorus: 2.4 mg/dL — ABNORMAL LOW (ref 2.5–4.6)

## 2021-01-15 LAB — OSMOLALITY, URINE: Osmolality, Ur: 392 mOsm/kg (ref 300–900)

## 2021-01-15 LAB — CBG MONITORING, ED
Glucose-Capillary: 70 mg/dL (ref 70–99)
Glucose-Capillary: 115 mg/dL — ABNORMAL HIGH (ref 70–99)
Glucose-Capillary: 119 mg/dL — ABNORMAL HIGH (ref 70–99)
Glucose-Capillary: 207 mg/dL — ABNORMAL HIGH (ref 70–99)
Glucose-Capillary: 212 mg/dL — ABNORMAL HIGH (ref 70–99)

## 2021-01-15 LAB — MAGNESIUM
Magnesium: 1.1 mg/dL — ABNORMAL LOW (ref 1.7–2.4)
Magnesium: 1.2 mg/dL — ABNORMAL LOW (ref 1.7–2.4)

## 2021-01-15 LAB — IRON AND TIBC
Iron: 105 ug/dL (ref 28–170)
Saturation Ratios: 62 % — ABNORMAL HIGH (ref 10.4–31.8)
TIBC: 168 ug/dL — ABNORMAL LOW (ref 250–450)
UIBC: 63 ug/dL

## 2021-01-15 LAB — SODIUM, URINE, RANDOM: Sodium, Ur: 34 mmol/L

## 2021-01-15 LAB — TSH: TSH: 4.932 u[IU]/mL — ABNORMAL HIGH (ref 0.350–4.500)

## 2021-01-15 LAB — CREATININE, URINE, RANDOM: Creatinine, Urine: 96.25 mg/dL

## 2021-01-15 LAB — GLUCOSE, CAPILLARY
Glucose-Capillary: 235 mg/dL — ABNORMAL HIGH (ref 70–99)
Glucose-Capillary: 248 mg/dL — ABNORMAL HIGH (ref 70–99)

## 2021-01-15 LAB — PREPARE RBC (CROSSMATCH)

## 2021-01-15 LAB — FERRITIN: Ferritin: 815 ng/mL — ABNORMAL HIGH (ref 11–307)

## 2021-01-15 MED ORDER — ATORVASTATIN CALCIUM 40 MG PO TABS
40.0000 mg | ORAL_TABLET | Freq: Every day | ORAL | Status: DC
  Administered 2021-01-15 – 2021-01-20 (×6): 40 mg via ORAL
  Filled 2021-01-15 (×6): qty 1

## 2021-01-15 MED ORDER — FAMOTIDINE 20 MG PO TABS
20.0000 mg | ORAL_TABLET | Freq: Every day | ORAL | Status: DC
  Administered 2021-01-15 – 2021-01-20 (×6): 20 mg via ORAL
  Filled 2021-01-15 (×6): qty 1

## 2021-01-15 MED ORDER — METRONIDAZOLE 500 MG/100ML IV SOLN
500.0000 mg | Freq: Three times a day (TID) | INTRAVENOUS | Status: DC
Start: 2021-01-15 — End: 2021-01-15
  Administered 2021-01-15: 500 mg via INTRAVENOUS
  Filled 2021-01-15: qty 100

## 2021-01-15 MED ORDER — TAMSULOSIN HCL 0.4 MG PO CAPS
0.4000 mg | ORAL_CAPSULE | Freq: Every day | ORAL | Status: DC
  Administered 2021-01-15 – 2021-01-20 (×6): 0.4 mg via ORAL
  Filled 2021-01-15 (×6): qty 1

## 2021-01-15 MED ORDER — DOCUSATE SODIUM 100 MG PO CAPS
100.0000 mg | ORAL_CAPSULE | Freq: Two times a day (BID) | ORAL | Status: DC
  Administered 2021-01-15 – 2021-01-20 (×12): 100 mg via ORAL
  Filled 2021-01-15 (×12): qty 1

## 2021-01-15 MED ORDER — SODIUM CHLORIDE 0.9 % IV SOLN
75.0000 mL/h | INTRAVENOUS | Status: AC
  Administered 2021-01-15: 75 mL/h via INTRAVENOUS

## 2021-01-15 MED ORDER — LATANOPROSTENE BUNOD 0.024 % OP SOLN: 1.0000 [drp] | Freq: Every day | OPHTHALMIC | Status: DC

## 2021-01-15 MED ORDER — ASPIRIN 81 MG PO CHEW
81.0000 mg | CHEWABLE_TABLET | Freq: Every morning | ORAL | Status: DC
  Administered 2021-01-15 – 2021-01-20 (×6): 81 mg via ORAL
  Filled 2021-01-15 (×6): qty 1

## 2021-01-15 MED ORDER — SODIUM CHLORIDE 0.9 % IV SOLN
2.0000 g | Freq: Two times a day (BID) | INTRAVENOUS | Status: DC
  Administered 2021-01-15: 2 g via INTRAVENOUS
  Filled 2021-01-15: qty 2

## 2021-01-15 MED ORDER — LATANOPROST 0.005 % OP SOLN
1.0000 [drp] | Freq: Every day | OPHTHALMIC | Status: DC
  Administered 2021-01-16 – 2021-01-20 (×6): 1 [drp] via OPHTHALMIC
  Filled 2021-01-15 (×2): qty 2.5

## 2021-01-15 MED ORDER — POTASSIUM PHOSPHATES 15 MMOLE/5ML IV SOLN
20.0000 mmol | Freq: Once | INTRAVENOUS | Status: AC
  Administered 2021-01-15: 20 mmol via INTRAVENOUS
  Filled 2021-01-15: qty 6.67

## 2021-01-15 MED ORDER — POLYETHYLENE GLYCOL 3350 17 G PO PACK: 17.0000 g | PACK | Freq: Every day | ORAL | Status: DC | PRN

## 2021-01-15 MED ORDER — WITCH HAZEL-GLYCERIN EX PADS: MEDICATED_PAD | CUTANEOUS | Status: DC | PRN

## 2021-01-15 MED ORDER — INSULIN GLARGINE 100 UNIT/ML ~~LOC~~ SOLN
15.0000 [IU] | Freq: Every day | SUBCUTANEOUS | Status: DC
  Administered 2021-01-16 – 2021-01-20 (×5): 15 [IU] via SUBCUTANEOUS
  Filled 2021-01-15 (×5): qty 0.15

## 2021-01-15 MED ORDER — MAGNESIUM SULFATE 4 GM/100ML IV SOLN
4.0000 g | Freq: Once | INTRAVENOUS | Status: AC
  Administered 2021-01-15: 4 g via INTRAVENOUS
  Filled 2021-01-15: qty 100

## 2021-01-15 MED ORDER — DEXTROSE 50 % IV SOLN
1.0000 | Freq: Once | INTRAVENOUS | Status: AC
  Administered 2021-01-15: 50 mL via INTRAVENOUS
  Filled 2021-01-15: qty 50

## 2021-01-15 MED ORDER — HYDROCODONE-ACETAMINOPHEN 5-325 MG PO TABS
1.0000 | ORAL_TABLET | ORAL | Status: DC | PRN
  Administered 2021-01-15: 2 via ORAL
  Filled 2021-01-15: qty 2

## 2021-01-15 MED ORDER — DORZOLAMIDE HCL-TIMOLOL MAL 2-0.5 % OP SOLN
1.0000 [drp] | Freq: Two times a day (BID) | OPHTHALMIC | Status: DC
  Administered 2021-01-15 – 2021-01-20 (×12): 1 [drp] via OPHTHALMIC
  Filled 2021-01-15: qty 10

## 2021-01-15 MED ORDER — VANCOMYCIN HCL IN DEXTROSE 1-5 GM/200ML-% IV SOLN: 1000.0000 mg | INTRAVENOUS | Status: DC

## 2021-01-15 MED ORDER — SODIUM CHLORIDE 0.9 % IV SOLN
1.0000 g | INTRAVENOUS | Status: DC
  Administered 2021-01-15 – 2021-01-17 (×3): 1 g via INTRAVENOUS
  Filled 2021-01-15: qty 10
  Filled 2021-01-15: qty 1
  Filled 2021-01-15 (×2): qty 10

## 2021-01-15 MED ORDER — DULOXETINE HCL 60 MG PO CPEP
60.0000 mg | ORAL_CAPSULE | Freq: Every day | ORAL | Status: DC
  Administered 2021-01-15 – 2021-01-20 (×6): 60 mg via ORAL
  Filled 2021-01-15 (×3): qty 1
  Filled 2021-01-15: qty 2
  Filled 2021-01-15 (×2): qty 1

## 2021-01-15 MED ORDER — ACETAMINOPHEN 650 MG RE SUPP: 650.0000 mg | Freq: Four times a day (QID) | RECTAL | Status: DC | PRN

## 2021-01-15 MED ORDER — BISACODYL 10 MG RE SUPP: 10.0000 mg | Freq: Every day | RECTAL | Status: DC | PRN

## 2021-01-15 MED ORDER — ACETAMINOPHEN 325 MG PO TABS: 650.0000 mg | ORAL_TABLET | Freq: Four times a day (QID) | ORAL | Status: DC | PRN

## 2021-01-15 MED ORDER — GERHARDT'S BUTT CREAM
TOPICAL_CREAM | Freq: Three times a day (TID) | CUTANEOUS | Status: AC
  Administered 2021-01-15: 1 via TOPICAL
  Filled 2021-01-15: qty 1

## 2021-01-15 MED ORDER — LEVOTHYROXINE SODIUM 50 MCG PO TABS
50.0000 ug | ORAL_TABLET | Freq: Every day | ORAL | Status: DC
  Administered 2021-01-15 – 2021-01-20 (×6): 50 ug via ORAL
  Filled 2021-01-15 (×6): qty 1

## 2021-01-15 MED ORDER — ENSURE ENLIVE PO LIQD
237.0000 mL | Freq: Two times a day (BID) | ORAL | Status: DC
  Administered 2021-01-16 – 2021-01-20 (×9): 237 mL via ORAL

## 2021-01-15 MED ORDER — INSULIN ASPART 100 UNIT/ML IJ SOLN
0.0000 [IU] | INTRAMUSCULAR | Status: DC
  Administered 2021-01-15 (×3): 3 [IU] via SUBCUTANEOUS
  Administered 2021-01-16: 2 [IU] via SUBCUTANEOUS
  Administered 2021-01-16: 3 [IU] via SUBCUTANEOUS
  Administered 2021-01-16: 2 [IU] via SUBCUTANEOUS
  Administered 2021-01-17: 7 [IU] via SUBCUTANEOUS
  Administered 2021-01-17: 2 [IU] via SUBCUTANEOUS
  Administered 2021-01-17: 7 [IU] via SUBCUTANEOUS
  Administered 2021-01-17: 5 [IU] via SUBCUTANEOUS
  Administered 2021-01-18: 1 [IU] via SUBCUTANEOUS
  Administered 2021-01-18: 5 [IU] via SUBCUTANEOUS
  Administered 2021-01-18: 2 [IU] via SUBCUTANEOUS
  Administered 2021-01-18: 3 [IU] via SUBCUTANEOUS
  Administered 2021-01-18: 7 [IU] via SUBCUTANEOUS
  Administered 2021-01-19: 1 [IU] via SUBCUTANEOUS
  Administered 2021-01-19: 2 [IU] via SUBCUTANEOUS
  Administered 2021-01-19: 7 [IU] via SUBCUTANEOUS
  Administered 2021-01-19: 5 [IU] via SUBCUTANEOUS
  Administered 2021-01-19: 7 [IU] via SUBCUTANEOUS
  Administered 2021-01-19: 1 [IU] via SUBCUTANEOUS
  Administered 2021-01-20 (×3): 3 [IU] via SUBCUTANEOUS
  Administered 2021-01-20: 5 [IU] via SUBCUTANEOUS
  Administered 2021-01-20: 2 [IU] via SUBCUTANEOUS
  Administered 2021-01-21: 3 [IU] via SUBCUTANEOUS
  Filled 2021-01-15: qty 0.09

## 2021-01-15 NOTE — ED Notes (Signed)
Patient was given her lunch tray. Patient was asleep however I did wake her to ask if she would like to eat. She said she did not want to eat at this time. I made sure her call bell was in her hand and told her to call me if she would like help eating.

## 2021-01-15 NOTE — Progress Notes (Signed)
PROGRESS NOTE    Hayley Fisher  RSW:[id number] DOB: [birth year] DOA: 01/14/2021 PCP: Danna Hefty, DO   Brief Narrative:  Hayley Fisher is a 87 y.o. female with medical history significant of IDDM, hypothyroidism, hypertension, anemia, constipation, dementia was brought from SNF with decreased p.o. intake noted to decreased LOC for about a week or so. Apparently patient had a Foley placed on May 24 and was being treated for UTI with Macrobid.however upon arrival to the ED, patient was alert and oriented.  Hemodynamically stable.  Recently admitted in April 2022 for acute delirium and metabolic encephalopathy thought to be secondary to UTI and hypoglycemia this improved and she was able to be discharged home to [facility] on 15 December 2020.  Patient tends to get frequent UTIs and usually that causes her to get confused.  Assessment & Plan:   Active Problems:   Hyperlipidemia associated with type 2 diabetes mellitus (HCC)   Skin ulcer of sacrum, limited to breakdown of skin ([place])   Uncontrolled type 2 diabetes mellitus with hyperglycemia (HCC)   Acute on chronic anemia   Acute encephalopathy   Hypothyroidism   Memory changes   Sepsis ([place])  Severe sepsis secondary to UTI, POA: Patient met severe sepsis criteria based on tachycardia, tachypnea, lactic acid of 4, acute encephalopathy and UTI.  She was started on broad-spectrum antibiotics and was continued on cefepime, Flagyl and vancomycin.  Since the source is UTI, she does not need all these antibiotics.  I will discontinue all of them and place her on Rocephin only and follow urine as well as blood culture and tailor antibiotics accordingly. Lactic acidosis has resolved.  Acute toxic encephalopathy secondary to UTI: Patient reportedly was alert and oriented upon arrival to the ED yesterday.  This morning on my evaluation, she was alert but she could not answer questions regarding orientation however her family visited her about 30 to 40  minutes after I saw her and after talking to them over the phone, they tell me that they found the patient alert and oriented as well.  I wonder if she has underlying dementia.  Hypothyroidism: Continue Synthroid.  Acute on chronic anemia: Initially her hemoglobin was 7.7, for this, 2 units of PRBC transfusion were ordered however repeat CBC was drawn before having blood was transfused and it shows drop in hemoglobin to 6.6.  She received 2 units of PRBC transfusion and posttransfusion hemoglobin is 8.9.  MCV within normal range.  FOBT is negative.  I will order iron studies, folate, B12 and repeat FOBT.  Renal function is normal and she does not have acute kidney injury.  Bilateral lower extremity DVT: Patient had bilateral lower extremity edema for which Doppler lower extremity ultrasound was done and she seems to have intermediate age bilateral DVT.  Due to acute on chronic anemia, she is not a candidate for anticoagulant.  I had a lengthy discussion with patient's daughterJackie over the phone and she is in agreement with the decision of not starting her on any anticoagulant.  I have consulted IR for IVC filter placement.  Hyperlipidemia: Continue Lipitor.  Type 2 diabetes mellitus: Blood sugar very labile, was hypoglycemic earlier and now slightly hyperglycemic.  I will not make any changes to her current regimen of Lantus 15 units and SSI.  Mild to moderate protein calorie malnutrition: Prealbumin 6.5 only.  We will consult dietitian for this.  Hypophosphatemia: We will replace with K-Phos and repeat labs in the morning.  Hypomagnesemia: 1.2.  Will replace.  Recheck in the morning.  Stage I pressure ulcer at the sacrum: Management per wound care.  Pictures as below        DVT prophylaxis: SCDs Start: 01/15/21 0123   Code Status: Full Code  Family Communication: None present at bedside.  Plan of care discussed with patient's daughter over the phone.  Status is:  Inpatient  Remains inpatient appropriate because:Inpatient level of care appropriate due to severity of illness   Dispo: The patient is from: SNF              Anticipated d/c is to: SNF              Patient currently is not medically stable to d/c.   Difficult to place patient No        Estimated body mass index is 26.24 kg/m as calculated from the following:   Height as of this encounter: _0  (1.549 m).   Weight as of this encounter: 63 kg.      Nutritional status:               Consultants:   None  Procedures:   None  Antimicrobials:  Anti-infectives (From admission, onward)   Start     Dose/Rate Route Frequency Ordered Stop   01/16/21 2200  vancomycin (VANCOCIN) IVPB 1000 mg/200 mL premix  Status:  Discontinued        1,000 mg 200 mL/hr over 60 Minutes Intravenous Every 36 hours 01/15/21 0101 01/15/21 1418   01/15/21 1800  cefTRIAXone (ROCEPHIN) 1 g in sodium chloride 0.9 % 100 mL IVPB        1 g 200 mL/hr over 30 Minutes Intravenous Every 24 hours 01/15/21 1418     01/15/21 1200  ceFEPIme (MAXIPIME) 2 g in sodium chloride 0.9 % 100 mL IVPB  Status:  Discontinued        2 g 200 mL/hr over 30 Minutes Intravenous Every 12 hours 01/15/21 0101 01/15/21 1418   01/15/21 0800  metroNIDAZOLE (FLAGYL) IVPB 500 mg  Status:  Discontinued        500 mg 100 mL/hr over 60 Minutes Intravenous Every 8 hours 01/15/21 0051 01/15/21 1418   01/14/21 2345  ceFEPIme (MAXIPIME) 2 g in sodium chloride 0.9 % 100 mL IVPB        2 g 200 mL/hr over 30 Minutes Intravenous  Once 01/14/21 2334 01/15/21 0035   01/14/21 2345  metroNIDAZOLE (FLAGYL) IVPB 500 mg        500 mg 100 mL/hr over 60 Minutes Intravenous  Once 01/14/21 2334 01/15/21 0053   01/14/21 2345  vancomycin (VANCOCIN) IVPB 1000 mg/200 mL premix  Status:  Discontinued        1,000 mg 200 mL/hr over 60 Minutes Intravenous  Once 01/14/21 2334 01/14/21 2341   01/14/21 2345  vancomycin (VANCOREADY) IVPB 1500 mg/300  mL        1,500 mg 150 mL/hr over 120 Minutes Intravenous  Once 01/14/21 2341 01/15/21 0237   01/14/21 2330  cefTRIAXone (ROCEPHIN) 1 g in sodium chloride 0.9 % 100 mL IVPB  Status:  Discontinued        1 g 200 mL/hr over 30 Minutes Intravenous  Once 01/14/21 2322 01/14/21 2337         Subjective: Patient seen and examined.  The only complaint she has is pain at the sacral area.  No other complaint.  She was alert but not oriented.  Objective: Vitals:   01/15/21 0700 01/15/21 9407  01/15/21 1023 01/15/21 1315  BP: 124/70 127/68 (!) 116/59 136/76  Pulse: 89 92 95 91  Resp: _0 Temp: 97.8 F (36.6 C) (!) 97.3 F (36.3 C)    TempSrc:  Oral    SpO2: 97% 98% 99% 99%  Weight:      Height:        Intake/Output Summary (Last 24 hours) at 01/15/2021 1430 Last data filed at 01/15/2021 1420 Gross per 24 hour  Intake 4037.69 ml  Output --  Net 4037.69 ml   Filed Weights   01/15/21 0037  Weight: 63 kg    Examination:  General exam: Appears calm and comfortable  Respiratory system: Clear to auscultation. Respiratory effort normal. Cardiovascular system: S1 & S2 heard, RRR. No JVD, murmurs, rubs, gallops or clicks. No pedal edema. Gastrointestinal system: Abdomen is nondistended, soft and nontender. No organomegaly or masses felt. Normal bowel sounds heard. Central nervous system: Alert but pleasantly confused. No focal neurological deficits. Extremities: Symmetric 5 x 5 power. Skin: Stage I pressure ulcer sacrum.    Data Reviewed: I have personally reviewed following labs and imaging studies  CBC: Recent Labs  Lab 01/14/21 2209 01/15/21 0451 01/15/21 0944  WBC 6.8 5.7 5.9  NEUTROABS 5.5 4.7  --   HGB 7.4* 6.6* 8.9*  HCT 24.0* 21.1* 27.6*  MCV 98.4 97.7 95.8  PLT 362 320 500   Basic Metabolic Panel: Recent Labs  Lab 01/14/21 2209 01/15/21 0451  NA 133* 135  K 4.6 3.9  CL 100 105  CO2 22 25  GLUCOSE 92 70  BUN 38* 29*  CREATININE 1.00 0.61   CALCIUM 8.5* 7.5*  MG  --  1.2*  1.1*  PHOS  --  2.4*  2.4*   GFR: Estimated Creatinine Clearance: 42.2 mL/min (by C-G formula based on SCr of 0.61 mg/dL). Liver Function Tests: Recent Labs  Lab 01/14/21 2209 01/15/21 0451  AST 16 13*  ALT 9 8  ALKPHOS 87 67  BILITOT 1.2 1.1  PROT 6.0* 4.9*  ALBUMIN 2.4* 1.9*   Recent Labs  Lab 01/14/21 2209  LIPASE 21   No results for input(s): AMMONIA in the last 168 hours. Coagulation Profile: Recent Labs  Lab 01/15/21 0030  INR 1.1   Cardiac Enzymes: Recent Labs  Lab 01/15/21 0451  CKTOTAL 27*   BNP (last 3 results) No results for input(s): PROBNP in the last 8760 hours. HbA1C: No results for input(s): HGBA1C in the last 72 hours. CBG: Recent Labs  Lab 01/15/21 0348 01/15/21 0759 01/15/21 0805 01/15/21 1117  GLUCAP 70 115* 119* 207*   Lipid Profile: No results for input(s): CHOL, HDL, LDLCALC, TRIG, CHOLHDL, LDLDIRECT in the last 72 hours. Thyroid Function Tests: Recent Labs    01/15/21 0451  TSH 4.932*   Anemia Panel: No results for input(s): VITAMINB12, FOLATE, FERRITIN, TIBC, IRON, RETICCTPCT in the last 72 hours. Sepsis Labs: Recent Labs  Lab 01/14/21 2153 01/15/21 0033 01/15/21 0451 01/15/21 0717  PROCALCITON  --   --  0.10  --   LATICACIDVEN 4.0* 3.4* 1.7 1.6    Recent Results (from the past 240 hour(s))  Resp Panel by RT-PCR (Flu A&B, Covid) Urine, Bag (ped)     Status: None   Collection Time: 01/14/21  9:54 PM   Specimen: Urine, Bag (ped); Nasopharyngeal(NP) swabs in vial transport medium  Result Value Ref Range Status   SARS Coronavirus 2 by RT PCR NEGATIVE NEGATIVE Final    Comment: (NOTE) SARS-CoV-2 target  nucleic acids are NOT DETECTED.  The SARS-CoV-2 RNA is generally detectable in upper respiratory specimens during the acute phase of infection. The lowest concentration of SARS-CoV-2 viral copies this assay can detect is 138 copies/mL. A negative result does not preclude  SARS-Cov-2 infection and should not be used as the sole basis for treatment or other patient management decisions. A negative result may occur with  improper specimen collection/handling, submission of specimen other than nasopharyngeal swab, presence of viral mutation(s) within the areas targeted by this assay, and inadequate number of viral copies(<138 copies/mL). A negative result must be combined with clinical observations, patient history, and epidemiological information. The expected result is Negative.  Fact Sheet for Patients:  EntrepreneurPulse.com.au  Fact Sheet for Healthcare Providers:  IncredibleEmployment.be  This test is no t yet approved or cleared by the [place] FDA and  has been authorized for detection and/or diagnosis of SARS-CoV-2 by FDA under an Emergency Use Authorization (EUA). This EUA will remain  in effect (meaning this test can be used) for the duration of the COVID-19 declaration under Section 564(b)(1) of the Act, 21 U.S.C.section 360bbb-3(b)(1), unless the authorization is terminated  or revoked sooner.       Influenza A by PCR NEGATIVE NEGATIVE Final   Influenza B by PCR NEGATIVE NEGATIVE Final    Comment: (NOTE) The Xpert Xpress SARS-CoV-2/FLU/RSV plus assay is intended as an aid in the diagnosis of influenza from Nasopharyngeal swab specimens and should not be used as a sole basis for treatment. Nasal washings and aspirates are unacceptable for Xpert Xpress SARS-CoV-2/FLU/RSV testing.  Fact Sheet for Patients: EntrepreneurPulse.com.au  Fact Sheet for Healthcare Providers: IncredibleEmployment.be  This test is not yet approved or cleared by the [place] FDA and has been authorized for detection and/or diagnosis of SARS-CoV-2 by FDA under an Emergency Use Authorization (EUA). This EUA will remain in effect (meaning this test can be used) for the duration of  the COVID-19 declaration under Section 564(b)(1) of the Act, 21 U.S.C. section 360bbb-3(b)(1), unless the authorization is terminated or revoked.  Performed at [facility], [place] [address], [place], [place] [address]   Culture, blood (Routine X 2) w Reflex to ID Panel     Status: None (Preliminary result)   Collection Time: 01/14/21 10:09 PM   Specimen: BLOOD RIGHT FOREARM  Result Value Ref Range Status   Specimen Description   Final    BLOOD RIGHT FOREARM Performed at [facility], [place] [address] [place], [place] [address]    Special Requests   Final    BOTTLES DRAWN AEROBIC AND ANAEROBIC Blood Culture results may not be optimal due to an inadequate volume of blood received in culture bottles Performed at [place] [address] [place], [place] [address]    Culture PENDING  Incomplete   Report Status PENDING  Incomplete  Culture, blood (Routine X 2) w Reflex to ID Panel     Status: None (Preliminary result)   Collection Time: 01/14/21 10:09 PM   Specimen: BLOOD RIGHT FOREARM  Result Value Ref Range Status   Specimen Description   Final    BLOOD RIGHT FOREARM Performed at [facility], [place] [address] [place], [place] [address]    Special Requests   Final    BOTTLES DRAWN AEROBIC AND ANAEROBIC Blood Culture results may not be optimal due to an excessive volume of blood received in culture bottles Performed at [facility]  [address] [place], [place] [address]    Culture PENDING  Incomplete   Report Status PENDING  Incomplete  MRSA PCR Screening     Status: None   Collection Time: 01/15/21  2:35 AM   Specimen: Nasal Mucosa; Nasopharyngeal  Result Value Ref Range Status   MRSA by PCR NEGATIVE NEGATIVE Final    Comment:        The GeneXpert MRSA Assay (FDA approved for NASAL specimens only), is one component of a comprehensive MRSA colonization surveillance program. It is not intended to  diagnose MRSA infection nor to guide or monitor treatment for MRSA infections. Performed at [facility], [place] [address], [place],  [address]       Radiology Studies: DG Abd 1 View  Result Date: 01/15/2021 CLINICAL DATA:  Constipation EXAM: ABDOMEN - 1 VIEW COMPARISON:  10/23/2020 FINDINGS: Normal abdominal gas pattern. Relatively small volume stool. No gross free intraperitoneal gas. Underpenetration precludes evaluation of the visceral shadows. Vascular calcifications noted within the abdominal aorta. IMPRESSION: Normal abdominal gas pattern. Electronically Signed   By: Fidela Salisbury MD   On: 01/15/2021 01:51   CT Head Wo Contrast  Result Date: 01/14/2021 CLINICAL DATA:  Mental status change. EXAM: CT HEAD WITHOUT CONTRAST TECHNIQUE: Contiguous axial images were obtained from the base of the skull through the vertex without intravenous contrast. COMPARISON:  CT head 09/18/2011. FINDINGS: Brain: Cerebral ventricle sizes are concordant with the degree of cerebral volume loss. Patchy and confluent areas of decreased attenuation are noted throughout the deep and periventricular white matter of the cerebral hemispheres bilaterally, compatible with chronic microvascular ischemic disease. No evidence of large-territorial acute infarction. No parenchymal hemorrhage. No mass lesion. No extra-axial collection. No mass effect or midline shift. No hydrocephalus. Basilar cisterns are patent. Empty sella. Findings is often a normal anatomic variant but can be associated with idiopathic intracranial hypertension (pseudotumor cerebri). Vascular: No hyperdense vessel. Atherosclerotic calcifications are present within the cavernous internal carotid arteries. Skull: No acute fracture. Similar-appearing permeative pattern of the clivus and sphenoid bone which is unchanged. Status post left mastoidectomy again noted. Sinuses/Orbits: Paranasal sinuses and mastoid air cells are clear. Lens  replacement. Otherwise the orbits are unremarkable. Other: None. IMPRESSION: No acute intracranial abnormality. Electronically Signed   By: Iven Finn M.D.   On: 01/14/2021 23:08   DG Chest Port 1 View  Result Date: 01/14/2021 CLINICAL DATA:  Failure to thrive EXAM: PORTABLE CHEST 1 VIEW COMPARISON:  12/01/2020 FINDINGS: Lung volumes are small and there is bibasilar atelectasis, unchanged from prior examination. No pneumothorax or pleural effusion. Cardiac size within normal limits. Pulmonary vascularity is normal. No acute bone abnormality. IMPRESSION: Stable pulmonary hypoinflation. Electronically Signed   By: Fidela Salisbury MD   On: 01/14/2021 22:26   VAS [place] LOWER EXTREMITY VENOUS (DVT)  Result Date: 01/15/2021  Lower Venous DVT Study Patient Name:  Hayley Fisher  Date of Exam:   01/15/2021 Medical Rec #: [id number]       Accession #:    [id number] Date of Birth: [birth year]       Patient Gender: F Patient Age:   89Y Exam Location:  [facility] Procedure:      VAS [place] LOWER EXTREMITY VENOUS (DVT) Referring Phys: [place] --------------------------------------------------------------------------------  Indications: Edema.  Risk Factors: None identified. Limitations: Poor ultrasound/tissue interface and patient positioning. Comparison Study: No prior studies. Performing Technologist: Oliver Hum RVT  Examination Guidelines: A complete evaluation includes B-mode imaging, spectral Doppler, color Doppler, and power  Doppler as needed of all accessible portions of each vessel. Bilateral testing is considered an integral part of a complete examination. Limited examinations for reoccurring indications may be performed as noted. The reflux portion of the exam is performed with the patient in reverse Trendelenburg.  +---------+---------------+---------+-----------+----------+-------------------+ RIGHT    CompressibilityPhasicitySpontaneityPropertiesThrombus Aging       +---------+---------------+---------+-----------+----------+-------------------+ CFV      Full           Yes      Yes                                      +---------+---------------+---------+-----------+----------+-------------------+ SFJ      Full                                                             +---------+---------------+---------+-----------+----------+-------------------+ FV Prox  Full                                                             +---------+---------------+---------+-----------+----------+-------------------+ FV Mid   Full                                                             +---------+---------------+---------+-----------+----------+-------------------+ FV DistalFull                                                             +---------+---------------+---------+-----------+----------+-------------------+ PFV      Partial        Yes      Yes                  Age Indeterminate   +---------+---------------+---------+-----------+----------+-------------------+ POP      Full           Yes      Yes                                      +---------+---------------+---------+-----------+----------+-------------------+ PTV      Full                                                             +---------+---------------+---------+-----------+----------+-------------------+ PERO  Not well visualized +---------+---------------+---------+-----------+----------+-------------------+   +---------+---------------+---------+-----------+----------+-------------------+ LEFT     CompressibilityPhasicitySpontaneityPropertiesThrombus Aging      +---------+---------------+---------+-----------+----------+-------------------+ CFV      Full           Yes      Yes                                      +---------+---------------+---------+-----------+----------+-------------------+  SFJ      Full                                                             +---------+---------------+---------+-----------+----------+-------------------+ FV Prox  Full                                                             +---------+---------------+---------+-----------+----------+-------------------+ FV Mid   Partial                                      Age Indeterminate   +---------+---------------+---------+-----------+----------+-------------------+ FV DistalFull                                                             +---------+---------------+---------+-----------+----------+-------------------+ PFV      Full                                                             +---------+---------------+---------+-----------+----------+-------------------+ POP      Full           Yes      Yes                                      +---------+---------------+---------+-----------+----------+-------------------+ PTV      Full                                                             +---------+---------------+---------+-----------+----------+-------------------+ PERO                                                  Not well visualized +---------+---------------+---------+-----------+----------+-------------------+     Summary: RIGHT: - Findings consistent with age indeterminate deep vein thrombosis involving the right proximal profunda vein. -  No cystic structure found in the popliteal fossa.  LEFT: - Findings consistent with age indeterminate deep vein thrombosis involving the left femoral vein. - No cystic structure found in the popliteal fossa.  *See table(s) above for measurements and observations. Electronically signed by Monica Martinez MD on 01/15/2021 at 12:35:38 PM.    Final     Scheduled Meds: . aspirin  81 mg Oral q morning  . atorvastatin  40 mg Oral Daily  . docusate sodium  100 mg Oral BID  . dorzolamide-timolol  1 drop Both Eyes BID   . DULoxetine  60 mg Oral Daily  . famotidine  20 mg Oral Daily  . Gerhardt's butt cream   Topical TID  . insulin aspart  0-9 Units Subcutaneous Q4H  . insulin glargine  15 Units Subcutaneous Daily  . latanoprost  1 drop Both Eyes QHS  . levothyroxine  50 mcg Oral Daily  . tamsulosin  0.4 mg Oral Daily   Continuous Infusions: . sodium chloride Stopped (01/14/21 2347)  . sodium chloride Stopped (01/14/21 2320)  . cefTRIAXone (ROCEPHIN)  IV    . lactated ringers Stopped (01/15/21 0212)  . potassium PHOSPHATE IVPB (in mmol) 20 mmol (01/15/21 0937)     LOS: 0 days   Time spent: 41 minutes   Darliss Cheney, MD [facility]  01/15/2021, 2:30 PM   How to contact the [facility] Attending or Consulting provider [place] or covering provider during after hours [place], for this patient?  1. Check the care team in [facility] and look for a) attending/consulting [facility] provider listed and b) the [facility] team listed. Page or secure chat [phone]. 2. Log into [email or web address] and use [place] [facility]'s universal password to access. If you do not have the password, please contact the hospital operator. 3. Locate the [facility] provider you are looking for under [facility] and page to a number that you can be directly reached. 4. If you still have difficulty reaching the provider, please page the [facility] (Director on Call) for the Hospitalists listed on amion for assistance.

## 2021-01-15 NOTE — Plan of Care (Signed)
  Problem: Education: Goal: Knowledge of General Education information will improve Description Including pain rating scale, medication(s)/side effects and non-pharmacologic comfort measures Outcome: Progressing   

## 2021-01-15 NOTE — ED Notes (Signed)
Family provided update

## 2021-01-15 NOTE — Progress Notes (Signed)
PT Cancellation Note  Patient Details Name: Hayley Fisher MRN: [id number] DOB: 1930/12/21   Cancelled Treatment:    Reason Eval/Treat Not Completed: Medical issues which prohibited therapy, Noted  hgb 6.6, to have Palliative consult, comes from SNF. Will check on tomorrow.   Claretha Cooper 01/15/2021, 8:52 AM  [place] Pager [phone] Office [phone]

## 2021-01-15 NOTE — Progress Notes (Signed)
..   Transition of Care [facility]) - Emergency Department Mini Assessment   Patient Details  Name: Hayley Fisher MRN: [id number] Date of Birth: Feb 06, 1931  Transition of [facility]) CM/SW Contact:    Jennamarie Goings C Tarpley-Carter, LCSWA Phone Number: 01/15/2021, 2:40 PM   Clinical Narrative: TOC CSW contact Joni [phone] at her request on behalf of the family.  Joni wanted to explain to CSW that pt is from [place], but pts family is from [place], [place].  The family would be returning home today and would return if they needed too.  Joni would be a great contact person for the family.  CSW explained Pallative Care to [facility] and disclosed that someone would be reaching out to the family.  Wen Merced Tarpley-Carter, MSW, LCSW-A Pronouns:  She, Her, Hers                  [facility] ED Transitions of CareClinical Social Worker [email or web address] [phone]     ED Mini Assessment: What brought you to the Emergency Department? : Failure to Thrive and Weakness  Barriers to Discharge: No Barriers Identified     Means of departure: Ambulance  Interventions which prevented an admission or readmission: Other (must enter comment) ([place])    Patient Contact and Communications Key Contact 1: Joni   Spoke with: Patria Mane Date: 01/15/21,     Contact Phone Number: [phone] Call outcome: CSW spoke with Joni and explained [place].  Joni explained to [place] that pt is from [place] and should not return home.  Patient states their goals for this hospitalization and ongoing recovery are:: N/A   Choice offered to / list presented to : NA  Admission diagnosis:  Sepsis [facility]) [A41.9] Patient Active Problem List   Diagnosis Date Noted  . Sepsis ([place]) 01/15/2021  . Acute encephalopathy 12/09/2020  . Hyperbilirubinemia 12/09/2020  . Hypothyroidism 12/09/2020  . Memory changes 10/20/2020  . Acute on chronic anemia 09/21/2020  . Gastroesophageal reflux  disease without esophagitis 09/16/2020  . Urinary frequency 09/11/2020  . Stage 1 decubitus ulcer 07/30/2020  . Uncontrolled type 2 diabetes mellitus with hyperglycemia ([place]) 07/02/2020  . Unstable gait 04/03/2018  . Frequent falls 04/03/2018  . Age-related physical debility 03/22/2018  . Skin ulcer of sacrum, limited to breakdown of skin ([place]) 03/16/2017  . Spinal stenosis of lumbar region 01/13/2016  . Chronic venous insufficiency 11/09/2015  . DDD (degenerative disc disease), lumbosacral 11/24/2014  . Constipation by delayed colonic transit 11/24/2014  . Frequent UTI 10/29/2012  . Type 2 diabetes mellitus with diabetic neuropathy ([place]) 10/19/2006  . Hyperlipidemia associated with type 2 diabetes mellitus ([place]) 10/19/2006  . Hypertension associated with diabetes ([place]) 10/19/2006  . Osteopenia 10/19/2006   PCP:  Danna Hefty, DO Pharmacy:   [facility] [place], [place] - [address] [place] [address] Phone: [phone] Fax: [phone]

## 2021-01-15 NOTE — ED Notes (Signed)
Daughter updated 

## 2021-01-15 NOTE — H&P (Addendum)
Hayley Fisher AYT:[id number] DOB: 09-12-1930 DOA: 01/14/2021     PCP: Danna Hefty, DO   Outpatient Specialists:  NONE    Patient arrived to ER on 01/14/21 at 1825 Referred by Attending Rancour, Annie Main, MD   Patient coming from:  From facility [facility]  Chief Complaint:   Chief Complaint  Patient presents with  . Failure To Thrive  . Weakness    HPI: Hayley Fisher is a 87 y.o. female with medical history significant of IDDM, hypothyroidism, hypertension, anemia, constipation, dementia    Presented with   decreased p.o. intake noted to   decreased LOC for about a week or so. Note on 24 May had a Foley placed.  She recently been on antibiotics for UTI.  On Macrobid  Recently admitted in April 2022 for acute delirium and metabolic encephalopathy thought to be secondary to UTI and hypoglycemia this improved and she was able to be discharged home to SNF on 15 December 2020 Patient does have known history of anemia with initial hemoglobin 6.6 required 1 unit of red blood cells no GI bleeding was noted Hypoglycemia was treated with increasing her Lantus to 15 units a day. Patient gets frequent UTIs and usually that causes her to get confused. She was seen by palliative care yesterday and was maintained to be full code.   Has   been vaccinated against COVID  and boosted   Initial COVID TEST  NEGATIVE   Lab Results  Component Value Date   SARSCOV2NAA NEGATIVE 01/14/2021   [place] NEGATIVE 12/14/2020   [place] NEGATIVE 12/09/2020   [place] NEGATIVE 07/06/2020     Regarding pertinent Chronic problems:    Hyperlipidemia -  on statins Lipitor Lipid Panel     Component Value Date/Time   CHOL 137 06/19/2020 1638   TRIG 61 06/19/2020 1638   HDL 67 06/19/2020 1638   CHOLHDL 2.0 06/19/2020 1638   CHOLHDL 2.1 03/25/2016 0921   VLDL 6 03/25/2016 0921   LDLCALC 57 06/19/2020 1638   LDLDIRECT 119 (H) 09/03/2013 1108   LABVLDL 13 06/19/2020 1638      HTN on enalapril  DM 2 -  Lab Results  Component Value Date   HGBA1C 7.9 (A) 11/20/2020   on insulin    Hypothyroidism:  Lab Results  Component Value Date   TSH 1.173 01/14/2021   on synthroid     Dementia/depression- on Cymbalta    Chronic anemia - baseline hg Hemoglobin & Hematocrit  Recent Labs    12/11/20 0307 12/13/20 0329 01/14/21 2209  HGB 9.1* 9.3* 7.4*     While in ER: On arrival slightly hypertensive with blood pressure down to 94/50 4 repeat 1 was up to 101/51 Hemoglobin noted down to 7.4 Initially was called sepsis started on cefepime and vancomycin Lactic acid noted to be 4 Ordered 1 unit of blood  Hemoccult neg from below  ED Triage Vitals  Enc Vitals Group     BP 01/14/21 1844 (!) 94/54     Pulse Rate 01/14/21 1844 100     Resp 01/14/21 1844 16     Temp 01/14/21 1844 97.7 F (36.5 C)     Temp Source 01/14/21 1844 Oral     SpO2 01/14/21 1835 96 %     Weight 01/15/21 0037 138 lb 14.2 oz (63 kg)     Height 01/15/21 0037 '5\' 1"'  (1.549 m)     Head Circumference --      Peak Flow --  Pain Score --      Pain Loc --      Pain Edu? --      Excl. in [place]? --   TMAX(24)@     _________________________________________ Significant initial  Findings: Abnormal Labs Reviewed  CBC WITH DIFFERENTIAL/PLATELET - Abnormal; Notable for the following components:      Result Value   RBC 2.44 (*)    Hemoglobin 7.4 (*)    HCT 24.0 (*)    RDW 16.0 (*)    Lymphs Abs 0.6 (*)    Abs Immature Granulocytes 0.10 (*)    All other components within normal limits  COMPREHENSIVE METABOLIC PANEL - Abnormal; Notable for the following components:   Sodium 133 (*)    BUN 38 (*)    Calcium 8.5 (*)    Total Protein 6.0 (*)    Albumin 2.4 (*)    GFR, Estimated 55 (*)    All other components within normal limits  URINALYSIS, ROUTINE W REFLEX MICROSCOPIC - Abnormal; Notable for the following components:   APPearance TURBID (*)    Glucose, UA 50 (*)    Hgb urine dipstick  MODERATE (*)    Protein, ur 100 (*)    Leukocytes,Ua MODERATE (*)    All other components within normal limits  LACTIC ACID, PLASMA - Abnormal; Notable for the following components:   Lactic Acid, Venous 4.0 (*)    All other components within normal limits   ____________________________________________ Ordered CT HEAD   NON acute  CXR -  NON acute    ECG: Ordered Personally reviewed by me showing: HR :94 Rhythm:   NSR,    no evidence of ischemic changes QTC 429 ____________________ This patient meets SIRS Criteria and may be septic.     The recent clinical data is shown below. Vitals:   01/14/21 2300 01/14/21 2330 01/15/21 0000 01/15/21 0037  BP: 109/64 109/64 126/63   Pulse:  97 98   Resp: '15 15 10   ' Temp:  (!) 97.4 F (36.3 C)    TempSrc:  Rectal    SpO2:  100% 95%   Weight:    63 kg  Height:    '5\' 1"'  (1.549 m)      WBC     Component Value Date/Time   WBC 6.8 01/14/2021 2209   LYMPHSABS 0.6 (L) 01/14/2021 2209   MONOABS 0.5 01/14/2021 2209   EOSABS 0.1 01/14/2021 2209   BASOSABS 0.0 01/14/2021 2209     Lactic Acid, Venous    Component Value Date/Time   LATICACIDVEN 4.0 (HH) 01/14/2021 2153     Procalcitonin  Ordered    UA   evidence of UTI     Urine analysis:    Component Value Date/Time   COLORURINE YELLOW 01/14/2021 2152   APPEARANCEUR TURBID (A) 01/14/2021 2152   LABSPEC 1.016 01/14/2021 2152   PHURINE 5.0 01/14/2021 2152   GLUCOSEU 50 (A) 01/14/2021 2152   HGBUR MODERATE (A) 01/14/2021 2152   HGBUR trace-intact 07/20/2010 1433   BILIRUBINUR NEGATIVE 01/14/2021 2152   BILIRUBINUR negative 09/09/2020 1604   BILIRUBINUR NEG 08/09/2016 1500   KETONESUR NEGATIVE 01/14/2021 2152   PROTEINUR 100 (A) 01/14/2021 2152   UROBILINOGEN 1.0 10/23/2020 1420   NITRITE NEGATIVE 01/14/2021 2152   LEUKOCYTESUR MODERATE (A) 01/14/2021 2152    Results for orders placed or performed during the hospital encounter of 01/14/21  Resp Panel by RT-PCR (Flu  A&B, Covid) Urine, Bag (ped)     Status: None  Collection Time: 01/14/21  9:54 PM   Specimen: Urine, Bag (ped); Nasopharyngeal(NP) swabs in vial transport medium  Result Value Ref Range Status   SARS Coronavirus 2 by RT PCR        Comment:    Influenza A by PCR NEGATIVE NEGATIVE Final   Influenza B by PCR NEGATIVE NEGATIVE Final         _______________________________________________ Hospitalist was called for admission for sepsis due to UTI  The following Work up has been ordered so far:  Orders Placed This Encounter  Procedures  . Urine Culture  . Culture, blood (Routine X 2) w Reflex to ID Panel  . Resp Panel by RT-PCR (Flu A&B, Covid) Nasopharyngeal Swab  . DG Chest Port 1 View  . CT Head Wo Contrast  . CBC with Differential/Platelet  . Comprehensive metabolic panel  . Urinalysis, Routine w reflex microscopic  . Lipase, blood  . Lactic acid, plasma  . TSH  . Protime-INR  . APTT  . Lactic acid, plasma  . Diet NPO time specified  . Cardiac monitoring  . Informed Consent Details: Physician/Practitioner Attestation; Transcribe to consent form and obtain patient signature  . Cardiac monitoring  . Document height and weight  . Assess and Document Glasgow Coma Scale  . Document vital signs within 1-hour of fluid bolus completion. Notify provider of abnormal vital signs despite fluid resuscitation.  . DO NOT delay antibiotics if unable to obtain blood culture.  . Refer to Sidebar Report: Sepsis Sidebar ED/IP  . Notify provider for difficulties obtaining IV access.  . Insert peripheral IV x 2  . Initiate Carrier Fluid Protocol  . Code Sepsis activation.  This occurs automatically when order is signed and prioritizes pharmacy, lab, and radiology services for STAT collections and interventions.  If [facility] downtime, call [facility] [phone]) to activate Code Sepsis.  . Consult to hospitalist  . Airborne and Contact precautions  . Pulse oximetry, continuous  . POC occult blood,  ED RN will collect  . ED EKG  . ED EKG 12-Lead  . Type and screen [place]  . Prepare RBC (crossmatch)    Following Medications were ordered in ER: Medications  0.9 %  sodium chloride infusion ( Intravenous Stopped 01/14/21 2347)  0.9 %  sodium chloride infusion (0 mL/hr Intravenous Hold 01/14/21 2320)  lactated ringers infusion ( Intravenous New Bag/Given 01/14/21 2352)  metroNIDAZOLE (FLAGYL) IVPB 500 mg (500 mg Intravenous New Bag/Given 01/14/21 2354)  vancomycin (VANCOREADY) IVPB 1500 mg/300 mL (1,500 mg Intravenous New Bag/Given 01/15/21 0037)  sodium chloride 0.9 % bolus 500 mL (0 mLs Intravenous Stopped 01/14/21 2333)  ceFEPIme (MAXIPIME) 2 g in sodium chloride 0.9 % 100 mL IVPB (0 g Intravenous Stopped 01/15/21 0035)  lactated ringers bolus 1,000 mL (1,000 mLs Intravenous New Bag/Given 01/14/21 2346)    And  lactated ringers bolus 1,000 mL (1,000 mLs Intravenous New Bag/Given 01/14/21 2346)        Consult Orders  (From admission, onward)         Start     Ordered   01/15/21 0022  Consult to hospitalist  Once       Provider:  (Not yet assigned)  Question Answer Comment  Place call to: [facility]   Reason for Consult Admit      01/15/21 0021            OTHER Significant initial  Findings:  labs showing:    Recent Labs  Lab 01/14/21 2209  NA 133*  K 4.6  CO2 22  GLUCOSE 92  BUN 38*  CREATININE 1.00  CALCIUM 8.5*    Cr    Up from baseline see below Lab Results  Component Value Date   CREATININE 1.00 01/14/2021   CREATININE 0.69 12/13/2020   CREATININE 0.74 12/11/2020    Recent Labs  Lab 01/14/21 2209  AST 16  ALT 9  ALKPHOS 87  BILITOT 1.2  PROT 6.0*  ALBUMIN 2.4*   Lab Results  Component Value Date   CALCIUM 8.5 (L) 01/14/2021   PHOS 2.9 07/04/2020          Plt: Lab Results  Component Value Date   PLT 362 01/14/2021         Recent Labs  Lab 01/14/21 2209  WBC 6.8  NEUTROABS 5.5  HGB 7.4*  HCT 24.0*   MCV 98.4  PLT 362    HG/HCT   Down   from baseline see below    Component Value Date/Time   HGB 7.4 (L) 01/14/2021 2209   HGB 10.5 (L) 09/21/2020 1113   HCT 24.0 (L) 01/14/2021 2209   HCT 32.0 (L) 09/21/2020 1113   MCV 98.4 01/14/2021 2209   MCV 93 09/21/2020 1113      Recent Labs  Lab 01/14/21 2209  LIPASE 21   No results for input(s): AMMONIA in the last 168 hours.   Cardiac Panel (last 3 results) No results for input(s): CKTOTAL, CKMB, TROPONINI, RELINDX in the last 72 hours.     DM  labs:  HbA1C: Recent Labs    09/30/20 0929 11/09/20 0956 11/20/20 1109  HGBA1C 8.0* 7.1* 7.9*       CBG (last 3)  No results for input(s): GLUCAP in the last 72 hours.        Cultures:    Component Value Date/Time   SDES  12/12/2020 1354    URINE, CLEAN CATCH Performed at [facility], [place] [address], [place], [place] [address]    SPECREQUEST  12/12/2020 1354    NONE Performed at [facility], [place] [address], [place], [place] [address]    CULT  12/12/2020 1354    NO GROWTH Performed at [facility] [address], [place],  [address]    REPTSTATUS 12/13/2020 FINAL 12/12/2020 1354     Radiological Exams on Admission: CT Head Wo Contrast  Result Date: 01/14/2021 CLINICAL DATA:  Mental status change. EXAM: CT HEAD WITHOUT CONTRAST TECHNIQUE: Contiguous axial images were obtained from the base of the skull through the vertex without intravenous contrast. COMPARISON:  CT head 09/18/2011. FINDINGS: Brain: Cerebral ventricle sizes are concordant with the degree of cerebral volume loss. Patchy and confluent areas of decreased attenuation are noted throughout the deep and periventricular white matter of the cerebral hemispheres bilaterally, compatible with chronic microvascular ischemic disease. No evidence of large-territorial acute infarction. No parenchymal hemorrhage. No mass lesion. No extra-axial collection. No mass  effect or midline shift. No hydrocephalus. Basilar cisterns are patent. Empty sella. Findings is often a normal anatomic variant but can be associated with idiopathic intracranial hypertension (pseudotumor cerebri). Vascular: No hyperdense vessel. Atherosclerotic calcifications are present within the cavernous internal carotid arteries. Skull: No acute fracture. Similar-appearing permeative pattern of the clivus and sphenoid bone which is unchanged. Status post left mastoidectomy again noted. Sinuses/Orbits: Paranasal sinuses and mastoid air cells are clear. Lens replacement. Otherwise the orbits are unremarkable. Other: None. IMPRESSION: No acute intracranial abnormality. Electronically Signed  By: Iven Finn M.D.   On: 01/14/2021 23:08   DG Chest Port 1 View  Result Date: 01/14/2021 CLINICAL DATA:  Failure to thrive EXAM: PORTABLE CHEST 1 VIEW COMPARISON:  12/01/2020 FINDINGS: Lung volumes are small and there is bibasilar atelectasis, unchanged from prior examination. No pneumothorax or pleural effusion. Cardiac size within normal limits. Pulmonary vascularity is normal. No acute bone abnormality. IMPRESSION: Stable pulmonary hypoinflation. Electronically Signed   By: Fidela Salisbury MD   On: 01/14/2021 22:26   _______________________________________________________________________________________________________ Latest  Blood pressure 126/63, pulse 98, temperature (!) 97.4 F (36.3 C), temperature source Rectal, resp. rate 10, height '5\' 1"'  (1.549 m), weight 63 kg, SpO2 95 %.   Review of Systems:    Pertinent positives include:   fatigue  Constitutional:  No weight loss, night sweats, Fevers, chills, , weight loss  HEENT:  No headaches, Difficulty swallowing,Tooth/dental problems,Sore throat,  No sneezing, itching, ear ache, nasal congestion, post nasal drip,  Cardio-vascular:  No chest pain, Orthopnea, PND, anasarca, dizziness, palpitations.no Bilateral lower extremity swelling  GI:  No  heartburn, indigestion, abdominal pain, nausea, vomiting, diarrhea, change in bowel habits, loss of appetite, melena, blood in stool, hematemesis Resp:  no shortness of breath at rest. No dyspnea on exertion, No excess mucus, no productive cough, No non-productive cough, No coughing up of blood.No change in color of mucus.No wheezing. Skin:  no rash or lesions. No jaundice GU:  no dysuria, change in color of urine, no urgency or frequency. No straining to urinate.  No flank pain.  Musculoskeletal:  No joint pain or no joint swelling. No decreased range of motion. No back pain.  Psych:  No change in mood or affect. No depression or anxiety. No memory loss.  Neuro: no localizing neurological complaints, no tingling, no weakness, no double vision, no gait abnormality, no slurred speech, no confusion  All systems reviewed and apart from [place] all are negative _______________________________________________________________________________________________ Past Medical History:   Past Medical History:  Diagnosis Date  . Arthritis   . Arthritis   . Cataract    bil cateracts removed  . Chronic kidney disease    "spot on one of my kidneys" per pt  . Diabetes mellitus   . Glaucoma   . Hyperkalemia 07/02/2020  . Hyperlipidemia   . Hyperplastic colon polyp   . Hypertension   . Hypothyroidism 12/09/2020  . Metabolic acidosis 19/41/7408  . Syncope   . Thyroid disease       Past Surgical History:  Procedure Laterality Date  . ABDOMINAL HYSTERECTOMY    . BLADDER SUSPENSION    . bladder tacking    . COLONOSCOPY    . EYE SURGERY    . INGUINAL HERNIA REPAIR Right     Social History:  Ambulatory   Gilford Rile     reports that she quit smoking about 40 years ago. Her smoking use included cigarettes. She has never used smokeless tobacco. She reports that she does not drink alcohol and does not use drugs.     Family History:   Family History  Problem Relation Age of Onset  . Colon  cancer Mother   . Other Father        cerebral hemorrhage  . Diabetes Brother   . Diabetes Sister        x 2  . Bone cancer Daughter   . Breast cancer Daughter 82  . Esophageal cancer Neg Hx   . Rectal cancer Neg Hx   . Stomach cancer  Neg Hx    ______________________________________________________________________________________________ Allergies: Allergies  Allergen Reactions  . Levemir [Insulin Detemir] Itching     Prior to Admission medications   Medication Sig Start Date End Date Taking? Authorizing Provider  ACCU-CHEK AVIVA PLUS test strip USE TO TEST BLOOD SUGAR UP TO 3 TIMES A DAY. 06/30/20   Mullis, Kiersten P, DO  Accu-Chek Softclix Lancets lancets USE TO TEST BLOOD SUGAR UP TO 3 TIMES A DAY. 06/30/20   Mullis, Kiersten P, DO  Alcohol Swabs PADS 1 Piece by Does not apply route daily. 08/10/20   Mullis, Kiersten P, DO  aspirin 81 MG chewable tablet Chew 81 mg by mouth every morning.    [provider]  atorvastatin (LIPITOR) 40 MG tablet Take 1 tablet (40 mg total) by mouth daily. 09/21/20   Mullis, Kiersten P, DO  brimonidine (ALPHAGAN P) 0.1 % SOLN Place 1 drop into both eyes 2 (two) times daily. 09/20/11     carbamide peroxide (DEBROX) 6.5 % OTIC solution Place 5 drops into both ears 2 (two) times daily. Patient taking differently: Place 2 drops into both ears 2 (two) times daily as needed (ear wax). 10/16/18   Alveda Reasons, MD  Catheter Self-Adhesive Urinary MISC 1 patch by Does not apply route daily. 10/26/20   Anderson, Chelsey L, DO  docusate sodium (COLACE) 100 MG capsule Take 1 capsule (100 mg total) by mouth 2 (two) times daily. 12/15/20   Harold Hedge, MD  dorzolamide-timolol (COSOPT) 22.3-6.8 MG/ML ophthalmic solution Place 1 drop into both eyes 2 (two) times daily. 05/18/20   [provider]  DULoxetine (CYMBALTA) 60 MG capsule TAKE 1 CAPSULE BY MOUTH EVERY DAY Patient taking differently: Take 60 mg by mouth daily. 10/22/18   Harriet Butte, DO   enalapril (VASOTEC) 20 MG tablet Take 1 tablet (20 mg total) by mouth daily. 09/21/20   Mullis, Kiersten P, DO  famotidine (PEPCID) 20 MG tablet Take 1 tablet (20 mg total) by mouth daily. If needed can take twice a day. 09/16/20   Mullis, Kiersten P, DO  ferrous sulfate 325 (65 FE) MG tablet Take 325 mg by mouth daily with breakfast.    [provider]  insulin glargine (LANTUS) 100 UNIT/ML injection Inject 0.15 mLs (15 Units total) into the skin daily. 12/15/20   Harold Hedge, MD  ketoconazole (NIZORAL) 2 % cream Apply 1 application topically daily. Patient taking differently: Apply 1 application topically daily as needed for irritation. 11/19/20   Criselda Peaches, DPM  levothyroxine (SYNTHROID) 50 MCG tablet Take 1 tablet (50 mcg total) by mouth daily. 11/10/20   Renato Shin, MD  metFORMIN (GLUCOPHAGE-XR) 500 MG 24 hr tablet Take 2 tablets (1,000 mg total) by mouth every morning. 11/09/20   Renato Shin, MD  mirabegron ER (MYRBETRIQ) 25 MG TB24 tablet Take 1 tablet (25 mg total) by mouth daily. 09/16/20   Mullis, Kiersten P, DO  Multiple Vitamins-Minerals (PRESERVISION AREDS 2+MULTI VIT) CAPS Take 1 capsule by mouth daily.    [provider]  ondansetron (ZOFRAN ODT) 4 MG disintegrating tablet Take 1 tablet (4 mg total) by mouth every 8 (eight) hours as needed for nausea. 07/28/20   Larene Pickett, PA-C  polyethylene glycol powder (GLYCOLAX/MIRALAX) 17 GM/SCOOP powder Take 17 g by mouth daily. 12/15/20   Harold Hedge, MD  Semaglutide (RYBELSUS) 3 MG TABS Take 3 mg by mouth daily. 11/09/20   Renato Shin, MD  SURE COMFORT INSULIN SYRINGE 31G X 5/16" 0.3 ML  MISC use 1 syringe with insulin daily 11/12/20   Mullis, Kiersten P, DO  tamsulosin (FLOMAX) 0.4 MG CAPS capsule Take 0.4 mg by mouth daily. 10/08/20   [provider]  VYZULTA 0.024 % SOLN Place 1 drop into both eyes at bedtime. 11/06/17   [provider]  witch hazel-glycerin (TUCKS) pad Apply topically as  needed for itching. 12/15/20   Harold Hedge, MD    ___________________________________________________________________________________________________ Physical Exam: Vitals with BMI 01/15/2021 01/14/2021 01/14/2021  Height '5\' 1"'  - -  Weight 138 lbs 14 oz - -  BMI 34.74 - -  Systolic 259 563 875  Diastolic 63 64 64  Pulse 98 97 -    1. General:  in No  Acute distress   Chronically ill  -appearing 2. Psychological: Alert and   Oriented 3. Head/ENT:    Dry Mucous Membranes                          Head Non traumatic, neck supple                            Poor Dentition 4. SKIN:   decreased Skin turgor,  Skin clean Dry  (nursing   noted pressure ulcer on the sacrum stage 1) 5. Heart: Regular rate and rhythm cardaiac Murmur, no Rub or gallop 6. Lungs:  no wheezes or crackles   7. Abdomen: Soft,  non-tender, Non distended  bowel sounds present 8. Lower extremities: no clubbing, cyanosis, bilateral edema L>R 9. Neurologically mild weakness on the right per pt this is chronic otherwise intact cranial nerves II through XII intact 10. MSK: Normal range of motion    Chart has been reviewed  ______________________________________________________________________________________________  Assessment/Plan   87 y.o. female with medical history significant of IDDM, hypothyroidism, hypertension, anemia, constipation, dementia Admitted for sepsis due to UTI  Present on Admission: . Sepsis ([place]) -   -SIRS criteria met with   Hr >90  ,   RR >20 Today's Vitals   01/15/21 0000 01/15/21 0030 01/15/21 0037 01/15/21 0101  BP: 126/63 140/73  140/75  Pulse: 98 94  94  Resp: 10 13  (!) 23  Temp:    97.6 F (36.4 C)  TempSrc:    Oral  SpO2: 95% 98%  100%  Weight:   63 kg   Height:   '5\' 1"'  (1.549 m)        -Most likely source being:  Urinary    Patient meeting criteria for Severe sepsis with    evidence of end organ damage/organ dysfunction such as   Acute Kidney Injury with Cr > 2,  Lab  Results  Component Value Date   CREATININE 1.00 01/14/2021   CREATININE 0.69 12/13/2020    elevated lactic acid >2     Component Value Date/Time   LATICACIDVEN 4.0 (HH) 01/14/2021 6433    acute metabolic encephalopathy       - Obtain serial lactic acid and procalcitonin level.  - Initiated IV antibiotics cefepime, vanc, flagyl  - await results of blood and urine culture  - Rehydrate aggressively        30cc/kg fluid   1:18 AM  . Uncontrolled type 2 diabetes mellitus with hyperglycemia (HCC) -  - Order Sensitive  SSI   - continue home insulin regimen    -  check TSH and HgA1C  - Hold by mouth medications    .  Memory changes - dementia monitor for any sign of sundowning  . Hypothyroidism - - Check TSH continue home medications at current dose  . Hyperlipidemia associated with type 2 diabetes mellitus ([place]) - continue statin  . Acute on chronic anemia - 1 unit of PRBC ordered repeat CBC after transfusion  . Acute encephalopathy -   - most likely multifactorial secondary to combination of  infection  ehydration secondary to decreased by mouth intake,   - Will rehydrate   - treat underlining infection   - Hold contributing medications   - currently evidence of improvemnt  - neurological exam appears to be unchanged from baseline   AKI - -  evidence of acute renal failure due to presence of following: Cr increased >0.3 from baseline   likely secondary to dehydration,        check FeNA       Rehydrate with IV fluids      HOLD  ACE and nephrotoxic medications  History does not suggest urinary retention or obstruction        Constipation - obtain KUB, bowel regimen   cardiac murmur - obtain echo  Leg edema L >R check doppler, could be due to hopoalbuminemia  Pressure sacral decub - wound care consult Other plan as per orders.  DVT prophylaxis:  SCD     Code Status:    Code Status: Prior FULL CODE as per patient   I had personally discussed CODE STATUS with  patient      Family Communication:   Family not at  Bedside    Disposition Plan:                              Back to current facility when stable                             Following barriers for discharge:                            Electrolytes corrected                               Anemia corrected                               able to transition to PO antibiotics                             Will need to be able to tolerate PO                          Would benefit from PT/OT eval prior to DC  Ordered                   Swallow eval - SLP ordered                   Diabetes care coordinator                   Transition of care consulted                   Nutrition    consulted  Palliative care    consulted                                Consults called: none   Admission status:  ED Disposition    ED Disposition Condition [place]: [facility] [[id number]]  Level of Care: Progressive [102]  Admit to Progressive based on following criteria: MULTISYSTEM THREATS such as stable sepsis, metabolic/electrolyte imbalance with or without encephalopathy that is responding to early treatment.  May admit patient to [facility] or [facility] if equivalent level of care is available:: No  Covid Evaluation: Asymptomatic Screening Protocol (No Symptoms)  Diagnosis: Sepsis [facility] [facility] Hospital) [[id number]]  Admitting Physician: Toy Baker [[id number]]  Attending Physician: Toy Baker [[id number]]  Estimated length of stay: past midnight tomorrow  Certification:: I certify this patient will need inpatient services for at least 2 midnights          inpatient     I Expect 2 midnight stay secondary to severity of patient's current illness need for inpatient interventions justified by the following:  hemodynamic instability despite optimal treatment ( hypotension )  Severe lab/radiological/exam abnormalities including:     UTI, elevated lactic acid and extensive comorbidities including:    DM2     dementia .    That are currently affecting medical management.   I expect  patient to be hospitalized for 2 midnights requiring inpatient medical care.  Patient is at high risk for adverse outcome (such as loss of life or disability) if not treated.  Indication for inpatient stay as follows:  Severe change from baseline regarding mental status Hemodynamic instability despite maximal medical therapy,     Need for IV antibiotics, IV fluids,      Level of care   Progressive tele indefinitely please discontinue once patient no longer qualifies COVID-19 Labs    Lab Results  Component Value Date   [place] NEGATIVE 01/14/2021     Precautions: admitted as   Covid Negative    PPE: Used by the provider:   N95   eye Goggles,  Gloves      Cyree Chuong 01/15/2021, 1:14 AM    [facility]     after 2 AM please page floor coverage PA If 7AM-7PM, please contact the day team taking care of the patient using [email or web address]   Patient was evaluated in the context of the global COVID-19 pandemic, which necessitated consideration that the patient might be at risk for infection with the SARS-CoV-2 virus that causes COVID-19. Institutional protocols and algorithms that pertain to the evaluation of patients at risk for COVID-19 are in a state of rapid change based on information released by regulatory bodies including the CDC and federal and state organizations. These policies and algorithms were followed during the patient's care.

## 2021-01-15 NOTE — Progress Notes (Signed)
Pharmacy Antibiotic Note  Hayley Fisher is a 87 y.o. female admitted on 01/14/2021 with sepsis.  Pharmacy has been consulted for Vancomycin & Cefepime dosing. Patient recently diagnosed with UTI as outpatient - foley placed & Macrobid course started 5/24.  This is current presumed source of infection. Cx pending. CXR negative.   Plan: Cefepime 2gm IV q12h for CrCl 30-53ml/min Vancomycin 1gm IV q36h to target AUC 400-550 Check Vancomycin levels at steady state Monitor renal function and cx data    Height: 5\' 1"  (154.9 cm) Weight: 63 kg (138 lb 14.2 oz) IBW/kg (Calculated) : 47.8  Temp (24hrs), Avg:97.6 F (36.4 C), Min:97.4 F (36.3 C), Max:97.7 F (36.5 C)  Recent Labs  Lab 01/14/21 2153 01/14/21 2209  WBC  --  6.8  CREATININE  --  1.00  LATICACIDVEN 4.0*  --     Estimated Creatinine Clearance: 33.7 mL/min (by C-G formula based on SCr of 1 mg/dL).    Allergies  Allergen Reactions  . Levemir [Insulin Detemir] Itching    Antimicrobials this admission: 5/27 Cefepime >>  5/27 Vancomycin >>  5/27 Flagyl >>  Dose adjustments this admission:  Microbiology results: 5/26 BCx:  5/26 UCx:   5/26 Resp PCR: negative for COVID, influenza  Thank you for allowing pharmacy to be a part of this patient's care.  Netta Cedars PharmD 01/15/2021 1:01 AM

## 2021-01-15 NOTE — ED Notes (Signed)
Family requesting to speak to Education officer, museum. Social worker made aware. Family in room at this time.

## 2021-01-15 NOTE — Progress Notes (Signed)
OT Cancellation Note  Patient Details Name: Hayley Fisher MRN: [id number] DOB: 12/01/1930   Cancelled Treatment:    Reason Eval/Treat Not Completed: Medical issues which prohibited therapy Noted  hgb 6.6, to have Palliative consult, comes from SNF. Will check on tomorrow.  Delbert Phenix OT OT pager: [place] 01/15/2021, 9:41 AM

## 2021-01-15 NOTE — Consult Note (Signed)
Chief Complaint: Patient was seen in consultation today for IVC filter placement Chief Complaint  Patient presents with  . Failure To Thrive  . Weakness    Referring Physician(s): [place]  Supervising Physician: Ruthann Cancer  Patient Status: [facility] - In-pt  History of Present Illness: Hayley Fisher is an 87 y.o. female with past medical history significant for diabetes, hypothyroidism, hyperlipidemia, stage I pressure ulcer of the sacrum,  glaucoma, hypertension, anemia, constipation, and dementia who presented to [facility] from skilled nursing facility 5/26 with decreased p.o. intake along with tachycardia, tachypnea, lactic acid of 4, acute encephalopathy and UTI.  She also has bilateral lower extremity edema. Lower extremity venous Doppler study done today revealed findings consistent with age-indeterminate deep vein thromboses involving the right proximal profunda vein and left femoral vein.  Hemoglobin at 0400 today was 6.6, currently 8.9 posttransfusion of 2 units packed red blood cells.  Creatinine normal, COVID-19 negative.  Blood cultures pending.  Fecal occult blood negative.  Per hospitalist due to acute on chronic anemia patient is not a candidate for anticoagulation.  Request now received for IVC filter placement. Past Medical History:  Diagnosis Date  . Arthritis   . Arthritis   . Cataract    bil cateracts removed  . Chronic kidney disease    "spot on one of my kidneys" per pt  . Diabetes mellitus   . Glaucoma   . Hyperkalemia 07/02/2020  . Hyperlipidemia   . Hyperplastic colon polyp   . Hypertension   . Hypothyroidism 12/09/2020  . Metabolic acidosis 16/60/6301  . Syncope   . Thyroid disease     Past Surgical History:  Procedure Laterality Date  . ABDOMINAL HYSTERECTOMY    . BLADDER SUSPENSION    . bladder tacking    . COLONOSCOPY    . EYE SURGERY    . INGUINAL HERNIA REPAIR Right     Allergies: Levemir [insulin  detemir]  Medications: Prior to Admission medications   Medication Sig Start Date End Date Taking? Authorizing Provider  ACCU-CHEK AVIVA PLUS test strip USE TO TEST BLOOD SUGAR UP TO 3 TIMES A DAY. Patient taking differently: 1 each by Other route See admin instructions. USE TO TEST BLOOD SUGAR UP  TO 3 TIMES A DAY. 06/30/20  Yes Mullis, Kiersten P, DO  Accu-Chek Softclix Lancets lancets USE TO TEST BLOOD SUGAR UP TO 3 TIMES A DAY. Patient taking differently: 1 each by Other route See admin instructions. USE TO TEST BLOOD SUGAR UP  TO 3 TIMES A DAY. 06/30/20  Yes Mullis, Kiersten P, DO  Alcohol Swabs PADS 1 Piece by Does not apply route daily. 08/10/20  Yes Mullis, Kiersten P, DO  aspirin 81 MG chewable tablet Chew 81 mg by mouth every morning.   Yes [provider]  atorvastatin (LIPITOR) 40 MG tablet Take 1 tablet (40 mg total) by mouth daily. 09/21/20  Yes Mullis, Kiersten P, DO  brimonidine (ALPHAGAN P) 0.1 % SOLN Place 1 drop into both eyes 2 (two) times daily. 09/20/11  Yes   carbamide peroxide (DEBROX) 6.5 % OTIC solution Place 5 drops into both ears 2 (two) times daily. Patient taking differently: Place 2 drops into both ears 2 (two) times daily as needed (ear wax). 10/16/18  Yes Alveda Reasons, MD  Catheter Self-Adhesive Urinary MISC 1 patch by Does not apply route daily. 10/26/20  Yes Anderson, Chelsey L, DO  docusate sodium (COLACE) 100 MG capsule Take 1 capsule (100 mg total) by mouth  2 (two) times daily. 12/15/20  Yes Harold Hedge, MD  dorzolamide-timolol (COSOPT) 22.3-6.8 MG/ML ophthalmic solution Place 1 drop into both eyes 2 (two) times daily. 05/18/20  Yes [provider]  DULoxetine (CYMBALTA) 60 MG capsule TAKE 1 CAPSULE BY MOUTH EVERY DAY Patient taking differently: Take 60 mg by mouth daily. 10/22/18  Yes Harriet Butte, DO  enalapril (VASOTEC) 20 MG tablet Take 1 tablet (20 mg total) by mouth daily. 09/21/20  Yes Mullis, Kiersten P, DO  famotidine (PEPCID) 20 MG  tablet Take 1 tablet (20 mg total) by mouth daily. If needed can take twice a day. 09/16/20  Yes Mullis, Kiersten P, DO  ferrous sulfate 325 (65 FE) MG tablet Take 325 mg by mouth daily with breakfast.   Yes [provider]  insulin glargine (LANTUS) 100 UNIT/ML injection Inject 0.15 mLs (15 Units total) into the skin daily. 12/15/20  Yes Harold Hedge, MD  insulin lispro (HUMALOG) 100 UNIT/ML injection Inject 2-15 Units into the skin See admin instructions. Per sliding scale, subcutaneous with meals if blood sugar is 70-150 = 0 units  151-200 = 2 units  201-250 = 5 units  251-300 = 8 units  301-350 = 11 units  351-400 = 15 units  >400 = Call MD.   Inject units into skin according to the sliding scale three times daily with meals.   Yes [provider]  ketoconazole (NIZORAL) 2 % cream Apply 1 application topically daily. 11/19/20  Yes McDonald, Stephan Minister, DPM  levothyroxine (SYNTHROID) 50 MCG tablet Take 1 tablet (50 mcg total) by mouth daily. 11/10/20  Yes Renato Shin, MD  metFORMIN (GLUCOPHAGE) 1000 MG tablet Take 1,000 mg by mouth daily. 01/11/21  Yes [provider]  mirabegron ER (MYRBETRIQ) 25 MG TB24 tablet Take 1 tablet (25 mg total) by mouth daily. 09/16/20  Yes Mullis, Kiersten P, DO  Multiple Vitamins-Minerals (PRESERVISION AREDS 2+MULTI VIT) CAPS Take 1 capsule by mouth daily.   Yes [provider]  nitrofurantoin, macrocrystal-monohydrate, (MACROBID) 100 MG capsule Take 100 mg by mouth 2 (two) times daily. 01/09/21 01/15/21 Yes [provider]  ondansetron (ZOFRAN ODT) 4 MG disintegrating tablet Take 1 tablet (4 mg total) by mouth every 8 (eight) hours as needed for nausea. 07/28/20  Yes Larene Pickett, PA-C  polyethylene glycol powder (GLYCOLAX/MIRALAX) 17 GM/SCOOP powder Take 17 g by mouth daily. 12/15/20  Yes Harold Hedge, MD  Semaglutide (RYBELSUS) 3 MG TABS Take 3 mg by mouth daily. 11/09/20  Yes Renato Shin, MD   sulfamethoxazole-trimethoprim (BACTRIM DS) 800-160 MG tablet Take 1 tablet by mouth 2 (two) times daily. 01/08/21 01/15/21 Yes [provider]  SURE COMFORT INSULIN SYRINGE 31G X 5/16" 0.3 ML MISC use 1 syringe with insulin daily Patient taking differently: Inject 1 Syringe into the skin daily. 11/12/20  Yes Mullis, Kiersten P, DO  tamsulosin (FLOMAX) 0.4 MG CAPS capsule Take 0.4 mg by mouth daily. 10/08/20  Yes [provider]  VYZULTA 0.024 % SOLN Place 1 drop into both eyes at bedtime. 11/06/17  Yes [provider]  witch hazel-glycerin (TUCKS) pad Apply topically as needed for itching. 12/15/20  Yes Harold Hedge, MD  metFORMIN (GLUCOPHAGE-XR) 500 MG 24 hr tablet Take 2 tablets (1,000 mg total) by mouth every morning. Patient not taking: Reported on 01/15/2021 11/09/20   Renato Shin, MD     Family History  Problem Relation Age of Onset  . Colon cancer Mother   . Other Father  cerebral hemorrhage  . Diabetes Brother   . Diabetes Sister        x 2  . Bone cancer Daughter   . Breast cancer Daughter 76  . Esophageal cancer Neg Hx   . Rectal cancer Neg Hx   . Stomach cancer Neg Hx     Social History   Socioeconomic History  . Marital status: Single    Spouse name: Not on file  . Number of children: 4  . Years of education: Not on file  . Highest education level: Not on file  Occupational History  . Occupation: retired  Tobacco Use  . Smoking status: Former Smoker    Types: Cigarettes    Quit date: 11/29/1980    Years since quitting: 40.1  . Smokeless tobacco: Never Used  Vaping Use  . Vaping Use: Never used  Substance and Sexual Activity  . Alcohol use: No    Alcohol/week: 0.0 standard drinks  . Drug use: No  . Sexual activity: Yes    Birth control/protection: None  Other Topics Concern  . Not on file  Social History Narrative  . Not on file   Social Determinants of Health   Financial Resource Strain: Not on file  Food Insecurity:  Not on file  Transportation Needs: Not on file  Physical Activity: Not on file  Stress: Not on file  Social Connections: Not on file     Review of Systems currently denies fever, headache, chest pain, dyspnea, cough, abdominal pain, nausea, vomiting or bleeding.  She does have some discomfort in the sacral region from pressure ulcer  Vital Signs: BP 138/66   Pulse 89   Temp 97.8 F (36.6 C)   Resp 20   Ht 5\' 1"  (1.549 m)   Wt 138 lb 14.2 oz (63 kg)   SpO2 98%   BMI 26.24 kg/m   Physical Exam awake, will answer simple questions okay but not fully oriented; chest clear to auscultation bilaterally.  Heart with regular rate and rhythm.  Abdomen soft, positive bowel sounds, nontender.  Bilateral lower extremity edema noted.  Stage I pressure ulcer of the sacrum.  Imaging: DG Abd 1 View  Result Date: 01/15/2021 CLINICAL DATA:  Constipation EXAM: ABDOMEN - 1 VIEW COMPARISON:  10/23/2020 FINDINGS: Normal abdominal gas pattern. Relatively small volume stool. No gross free intraperitoneal gas. Underpenetration precludes evaluation of the visceral shadows. Vascular calcifications noted within the abdominal aorta. IMPRESSION: Normal abdominal gas pattern. Electronically Signed   By: Fidela Salisbury MD   On: 01/15/2021 01:51   CT Head Wo Contrast  Result Date: 01/14/2021 CLINICAL DATA:  Mental status change. EXAM: CT HEAD WITHOUT CONTRAST TECHNIQUE: Contiguous axial images were obtained from the base of the skull through the vertex without intravenous contrast. COMPARISON:  CT head 09/18/2011. FINDINGS: Brain: Cerebral ventricle sizes are concordant with the degree of cerebral volume loss. Patchy and confluent areas of decreased attenuation are noted throughout the deep and periventricular white matter of the cerebral hemispheres bilaterally, compatible with chronic microvascular ischemic disease. No evidence of large-territorial acute infarction. No parenchymal hemorrhage. No mass lesion. No  extra-axial collection. No mass effect or midline shift. No hydrocephalus. Basilar cisterns are patent. Empty sella. Findings is often a normal anatomic variant but can be associated with idiopathic intracranial hypertension (pseudotumor cerebri). Vascular: No hyperdense vessel. Atherosclerotic calcifications are present within the cavernous internal carotid arteries. Skull: No acute fracture. Similar-appearing permeative pattern of the clivus and sphenoid bone which is unchanged. Status post  left mastoidectomy again noted. Sinuses/Orbits: Paranasal sinuses and mastoid air cells are clear. Lens replacement. Otherwise the orbits are unremarkable. Other: None. IMPRESSION: No acute intracranial abnormality. Electronically Signed   By: Iven Finn M.D.   On: 01/14/2021 23:08   DG Chest Port 1 View  Result Date: 01/14/2021 CLINICAL DATA:  Failure to thrive EXAM: PORTABLE CHEST 1 VIEW COMPARISON:  12/01/2020 FINDINGS: Lung volumes are small and there is bibasilar atelectasis, unchanged from prior examination. No pneumothorax or pleural effusion. Cardiac size within normal limits. Pulmonary vascularity is normal. No acute bone abnormality. IMPRESSION: Stable pulmonary hypoinflation. Electronically Signed   By: Fidela Salisbury MD   On: 01/14/2021 22:26   VAS [place] LOWER EXTREMITY VENOUS (DVT)  Result Date: 01/15/2021  Lower Venous DVT Study Patient Name:  Hayley Fisher  Date of Exam:   01/15/2021 Medical Rec #: [id number]       Accession #:    [id number] Date of Birth: 1931/05/11       Patient Gender: F Patient Age:   89Y Exam Location:  [facility] Procedure:      VAS [place] LOWER EXTREMITY VENOUS (DVT) Referring Phys: [place] --------------------------------------------------------------------------------  Indications: Edema.  Risk Factors: None identified. Limitations: Poor ultrasound/tissue interface and patient positioning. Comparison Study: No prior studies. Performing Technologist: Oliver Hum RVT  Examination Guidelines: A complete evaluation includes B-mode imaging, spectral Doppler, color Doppler, and power Doppler as needed of all accessible portions of each vessel. Bilateral testing is considered an integral part of a complete examination. Limited examinations for reoccurring indications may be performed as noted. The reflux portion of the exam is performed with the patient in reverse Trendelenburg.  +---------+---------------+---------+-----------+----------+-------------------+ RIGHT    CompressibilityPhasicitySpontaneityPropertiesThrombus Aging      +---------+---------------+---------+-----------+----------+-------------------+ CFV      Full           Yes      Yes                                      +---------+---------------+---------+-----------+----------+-------------------+ SFJ      Full                                                             +---------+---------------+---------+-----------+----------+-------------------+ FV Prox  Full                                                             +---------+---------------+---------+-----------+----------+-------------------+ FV Mid   Full                                                             +---------+---------------+---------+-----------+----------+-------------------+ FV DistalFull                                                             +---------+---------------+---------+-----------+----------+-------------------+  PFV      Partial        Yes      Yes                  Age Indeterminate   +---------+---------------+---------+-----------+----------+-------------------+ POP      Full           Yes      Yes                                      +---------+---------------+---------+-----------+----------+-------------------+ PTV      Full                                                              +---------+---------------+---------+-----------+----------+-------------------+ PERO                                                  Not well visualized +---------+---------------+---------+-----------+----------+-------------------+   +---------+---------------+---------+-----------+----------+-------------------+ LEFT     CompressibilityPhasicitySpontaneityPropertiesThrombus Aging      +---------+---------------+---------+-----------+----------+-------------------+ CFV      Full           Yes      Yes                                      +---------+---------------+---------+-----------+----------+-------------------+ SFJ      Full                                                             +---------+---------------+---------+-----------+----------+-------------------+ FV Prox  Full                                                             +---------+---------------+---------+-----------+----------+-------------------+ FV Mid   Partial                                      Age Indeterminate   +---------+---------------+---------+-----------+----------+-------------------+ FV DistalFull                                                             +---------+---------------+---------+-----------+----------+-------------------+ PFV      Full                                                             +---------+---------------+---------+-----------+----------+-------------------+  POP      Full           Yes      Yes                                      +---------+---------------+---------+-----------+----------+-------------------+ PTV      Full                                                             +---------+---------------+---------+-----------+----------+-------------------+ PERO                                                  Not well visualized +---------+---------------+---------+-----------+----------+-------------------+      Summary: RIGHT: - Findings consistent with age indeterminate deep vein thrombosis involving the right proximal profunda vein. - No cystic structure found in the popliteal fossa.  LEFT: - Findings consistent with age indeterminate deep vein thrombosis involving the left femoral vein. - No cystic structure found in the popliteal fossa.  *See table(s) above for measurements and observations. Electronically signed by Monica Martinez MD on 01/15/2021 at 12:35:38 PM.    Final     Labs:  CBC: Recent Labs    12/13/20 0329 01/14/21 2209 01/15/21 0451 01/15/21 0944  WBC 4.2 6.8 5.7 5.9  HGB 9.3* 7.4* 6.6* 8.9*  HCT 28.5* 24.0* 21.1* 27.6*  PLT 264 362 320 260    COAGS: Recent Labs    01/15/21 0030  INR 1.1  APTT 36    BMP: Recent Labs    06/19/20 1638 07/02/20 1843 09/16/20 1644 10/22/20 1951 12/11/20 0307 12/13/20 0329 01/14/21 2209 01/15/21 0451  NA 134   < > 139   < > 134* 135 133* 135  K 4.2   < > 5.0   < > 4.2 4.2 4.6 3.9  CL 95*   < > 98   < > 100 100 100 105  CO2 25   < > 25   < > 26 27 22 25   GLUCOSE 357*   < > 188*   < > 189* 131* 92 70  BUN 16   < > 16   < > 21 15 38* 29*  CALCIUM 9.1   < > 9.4   < > 8.2* 8.1* 8.5* 7.5*  CREATININE 0.75   < > 0.69   < > 0.74 0.69 1.00 0.61  GFRNONAA 72   < > 79   < > >60 >60 55* >60  GFRAA 83  --  91  --   --   --   --   --    < > = values in this interval not displayed.    LIVER FUNCTION TESTS: Recent Labs    12/09/20 0315 12/10/20 0319 01/14/21 2209 01/15/21 0451  BILITOT 2.2* 1.7* 1.2 1.1  AST 19 47* 16 13*  ALT 15 31 9 8   ALKPHOS 73 53 87 67  PROT 6.5 5.2* 6.0* 4.9*  ALBUMIN 3.3* 2.6* 2.4* 1.9*    TUMOR MARKERS: No results for input(s): AFPTM, CEA, CA199, CHROMGRNA in the last 8760 hours.  Assessment and Plan: 87 y.o. female with past medical history significant for diabetes, hypothyroidism, hyperlipidemia, stage I pressure ulcer of the sacrum,  glaucoma, hypertension, anemia, constipation, and dementia who  [facility]ed [facility] from skilled nursing facility 5/26 with decreased p.o. intake along with tachycardia, tachypnea, lactic acid of 4, acute encephalopathy and UTI.  She also has bilateral lower extremity edema. Lower extremity venous Doppler study done today revealed findings consistent with age-indeterminate deep vein thromboses involving the right proximal profunda vein and left femoral vein.  Hemoglobin at 0400 today was 6.6, currently 8.9 posttransfusion of 2 units packed red blood cells.  Creatinine normal, COVID-19 negative.  Blood cultures pending.  Fecal occult blood negative.  Per hospitalist due to acute on chronic anemia patient is not a candidate for anticoagulation.  Request now received for IVC filter placement.  Case has been reviewed by Dr. Earleen Newport and discussed with Dr. Doristine Bosworth.Risks and benefits discussed with the patient/daughter including, but not limited to bleeding, infection, contrast induced renal failure, filter fracture or migration which can lead to emergency surgery or even death, strut penetration with damage or irritation to adjacent structures and caval thrombosis.  All of the patient's questions were answered, patient is agreeable to proceed. Consent signed and in chart.  Procedure scheduled for 5/28   Thank you for this interesting consult.  I greatly enjoyed meeting Hayley Fisher and look forward to participating in their care.  A copy of this report was sent to the requesting provider on this date.  Electronically Signed: D. Rowe Robert, PA-C 01/15/2021, 4:43 PM   I spent a total of 30 minutes    in face to face in clinical consultation, greater than 50% of which was counseling/coordinating care for IVC filter placement

## 2021-01-15 NOTE — Progress Notes (Signed)
Clinical/Bedside Swallow Evaluation Patient Details  Name: Hayley Fisher MRN: [id number] Date of Birth: February 28, 1931  Today's Date: 01/15/2021 Time: SLP Start Time (ACUTE ONLY): 1025 SLP Stop Time (ACUTE ONLY): 1036 SLP Time Calculation (min) (ACUTE ONLY): 11 min  Past Medical History:  Past Medical History:  Diagnosis Date   Arthritis    Arthritis    Cataract    bil cateracts removed   Chronic kidney disease    "spot on one of my kidneys" per pt   Diabetes mellitus    Glaucoma    Hyperkalemia 07/02/2020   Hyperlipidemia    Hyperplastic colon polyp    Hypertension    Hypothyroidism 12/16/621   Metabolic acidosis 76/28/3151   Syncope    Thyroid disease    Past Surgical History:  Past Surgical History:  Procedure Laterality Date   ABDOMINAL HYSTERECTOMY     BLADDER SUSPENSION     bladder tacking     COLONOSCOPY     EYE SURGERY     INGUINAL HERNIA REPAIR Right    HPI:  Hayley Fisher is a 87 y.o. female admitted with FTT and weakness, poor po intake recently due to AMS.  Pt is a resident of [facility] - she has undergone palliative consult with desire for full code per notes.  PMH + for IDDM, hypothyroidism, hypertension, anemia, constipation, dementia.  Pt Recently admitted in April 2022 for acute delirium and metabolic encephalopathy thought to be secondary to UTI and hypoglycemia this improved and she was able to be discharged home to [facility] on 15 December 2020.  She has a wound and is being followed by wound care.  DG abdomen showed small volume stool and CXR showed general hypoinflation.  Pt has been treated with H2 blocker and previously PPI for reflux without esophagitis.   Assessment / Plan / Recommendation Clinical Impression  Patient presents with functional oropharyngeal swallow ability based on clinical swallow evaluation.  NO s/s of aspiration and swallow was timely.  Pt has upper denture and lower partial.  Pt denies any dysphagia but does endorse some "heartburn".   Largest barrier to adequate po per pt statement is her gustatory changes.  She is currently on a carb modified and heart healthy diet.  Recommend consider dietician consult to maximize po intake to aid wound healing, etc.  No follow up needed. SLP Visit Diagnosis: Dysphagia, unspecified (R13.10)    Aspiration Risk  No limitations    Diet Recommendation Regular;Thin liquid   Liquid Administration via: Cup;Straw Medication Administration: Whole meds with liquid Supervision: Patient able to self feed Compensations: Slow rate;Small sips/bites Postural Changes: Seated upright at 90 degrees;Remain upright for at least 30 minutes after po intake    Other  Recommendations Recommended Consults: Other (Comment) (dietician)   Follow up Recommendations    N/a    Frequency and Duration   N/a         Prognosis    N/a    Swallow Study   General Date of Onset: 01/15/21 HPI: Hayley Fisher is a 87 y.o. female admitted with FTT and weakness, poor po intake recently due to AMS.  Pt is a resident of [facility] - she has undergone palliative consult with desire for full code per notes.  PMH + for IDDM, hypothyroidism, hypertension, anemia, constipation, dementia.  Pt Recently admitted in April 2022 for acute delirium and metabolic encephalopathy thought to be secondary to UTI and hypoglycemia this improved and she was able to be discharged home to SNF on  15 December 2020.  She has a wound and is being followed by wound care.  DG abdomen showed small volume stool and CXR showed general hypoinflation.  Pt has been treated with H2 blocker and previously PPI for reflux without esophagitis. Type of Study: Bedside Swallow Evaluation Previous Swallow Assessment: none Diet Prior to this Study: Regular Temperature Spikes Noted: No Respiratory Status: Room air History of Recent Intubation: No Behavior/Cognition: Alert Oral Cavity Assessment: Within Functional Limits Oral Care Completed by SLP:  No Self-Feeding Abilities: Total assist Patient Positioning: Upright in bed Baseline Vocal Quality: Normal Volitional Cough: Strong Volitional Swallow: Able to elicit    Oral/Motor/Sensory Function Overall Oral Motor/Sensory Function: Within functional limits   Ice Chips Ice chips: Not tested   Thin Liquid Thin Liquid: Within functional limits    Nectar Thick Nectar Thick Liquid: Not tested   Honey Thick Honey Thick Liquid: Not tested   Puree Puree: Within functional limits   Solid     Solid: Within functional limits     Kathleen Lime, MS [facility] SLP Acute Rehab Services Office [phone] Pager [phone]  Macario Golds 01/15/2021,11:05 AM

## 2021-01-15 NOTE — Consult Note (Signed)
WOC Nurse Consult Note: Reason for Consult:Moisture associated skin damage, partial thickness skin loss plus Stage 2 PI Wound type:Moisture, pressure Pressure Injury POA: Yes Measurement:To be obtained by Bedside RN today Wound EIH:DTPN, moist Drainage (amount, consistency, odor) none Periwound:macerated Dressing procedure/placement/frequency: I will provide guidance for Nursing via the Orders for placement of a sacral prophylactic foam, and use Gerhart's Butt cream to the buttocks and medial and posterior thighs.  Heels to be floated with Prevalon Boots.  Turning and repositioning per house protocol.  [place] nursing team will not follow, but will remain available to this patient, the nursing and medical teams.  Please re-consult if needed. Thanks, Maudie Flakes, MSN, RN, [place], Arther Abbott  Pager# [phone]

## 2021-01-15 NOTE — ED Notes (Signed)
MD Mansy paged about patients cbg:70. He gave a verbal order of d50 and hold the long acting insulin due today.

## 2021-01-15 NOTE — Progress Notes (Signed)
Bilateral lower extremity venous duplex has been completed. Preliminary results can be found in CV Proc through chart review.  Results were given to the patient's nurse, Caryl Pina.  01/15/21 9:39 AM Hayley Fisher RVT

## 2021-01-15 NOTE — Progress Notes (Signed)
Results for DORETTE, HARTEL (MRN [id number]) as of 01/15/2021 17:02  Ref. Range 01/14/2021 21:54  RESP PANEL BY RT-PCR (FLU A&B, COVID) [id number] Unknown Rpt  Influenza A By PCR Latest Ref Range: NEGATIVE  NEGATIVE  Influenza B By PCR Latest Ref Range: NEGATIVE  NEGATIVE  SARS Coronavirus 2 by RT PCR Latest Ref Range: NEGATIVE  NEGATIVE   PPE discontinued per protocol. All above results are negative.

## 2021-01-15 NOTE — Sepsis Progress Note (Signed)
Notified bedside nurse of need to draw repeat lactic acid. 

## 2021-01-15 NOTE — ED Notes (Signed)
Foley bag changed and securing device applied to left upper inner thigh.

## 2021-01-15 NOTE — Sepsis Progress Note (Signed)
Elink Following for Code Sepsis  

## 2021-01-15 NOTE — Sepsis Progress Note (Signed)
Notified provider of need to order repeat lactic acid. ° °

## 2021-01-16 ENCOUNTER — Other Ambulatory Visit (HOSPITAL_COMMUNITY): Payer: Medicare Other

## 2021-01-16 ENCOUNTER — Inpatient Hospital Stay (HOSPITAL_COMMUNITY): Payer: Medicare Other

## 2021-01-16 DIAGNOSIS — Z7189 Other specified counseling: Secondary | ICD-10-CM | POA: Diagnosis not present

## 2021-01-16 DIAGNOSIS — Z515 Encounter for palliative care: Secondary | ICD-10-CM

## 2021-01-16 DIAGNOSIS — Z95828 Presence of other vascular implants and grafts: Secondary | ICD-10-CM

## 2021-01-16 DIAGNOSIS — A419 Sepsis, unspecified organism: Secondary | ICD-10-CM | POA: Diagnosis not present

## 2021-01-16 DIAGNOSIS — R531 Weakness: Secondary | ICD-10-CM | POA: Diagnosis not present

## 2021-01-16 DIAGNOSIS — N179 Acute kidney failure, unspecified: Secondary | ICD-10-CM | POA: Diagnosis not present

## 2021-01-16 DIAGNOSIS — R6520 Severe sepsis without septic shock: Secondary | ICD-10-CM | POA: Diagnosis not present

## 2021-01-16 HISTORY — PX: IR IVC FILTER PLMT / S&I /IMG GUID/MOD SED: IMG701

## 2021-01-16 HISTORY — DX: Presence of other vascular implants and grafts: Z95.828

## 2021-01-16 LAB — GLUCOSE, CAPILLARY
Glucose-Capillary: 159 mg/dL — ABNORMAL HIGH (ref 70–99)
Glucose-Capillary: 120 mg/dL — ABNORMAL HIGH (ref 70–99)
Glucose-Capillary: 110 mg/dL — ABNORMAL HIGH (ref 70–99)
Glucose-Capillary: 78 mg/dL (ref 70–99)
Glucose-Capillary: 97 mg/dL (ref 70–99)
Glucose-Capillary: 175 mg/dL — ABNORMAL HIGH (ref 70–99)

## 2021-01-16 LAB — CBC
HCT: 30.9 % — ABNORMAL LOW (ref 36.0–46.0)
Hemoglobin: 9.8 g/dL — ABNORMAL LOW (ref 12.0–15.0)
MCH: 30.3 pg (ref 26.0–34.0)
MCHC: 31.7 g/dL (ref 30.0–36.0)
MCV: 95.7 fL (ref 80.0–100.0)
Platelets: 313 10*3/uL (ref 150–400)
RBC: 3.23 MIL/uL — ABNORMAL LOW (ref 3.87–5.11)
RDW: 15.5 % (ref 11.5–15.5)
WBC: 5.8 10*3/uL (ref 4.0–10.5)
nRBC: 0 % (ref 0.0–0.2)

## 2021-01-16 LAB — BASIC METABOLIC PANEL
Anion gap: 5 (ref 5–15)
BUN: 21 mg/dL (ref 8–23)
CO2: 28 mmol/L (ref 22–32)
Calcium: 8.2 mg/dL — ABNORMAL LOW (ref 8.9–10.3)
Chloride: 103 mmol/L (ref 98–111)
Creatinine, Ser: 0.68 mg/dL (ref 0.44–1.00)
GFR, Estimated: >60 mL/min (ref >60)
Glucose, Bld: 220 mg/dL — ABNORMAL HIGH (ref 70–99)
Potassium: 4.9 mmol/L (ref 3.5–5.1)
Sodium: 136 mmol/L (ref 135–145)

## 2021-01-16 LAB — URINE CULTURE: Culture: NO GROWTH

## 2021-01-16 LAB — PHOSPHORUS: Phosphorus: 2.7 mg/dL (ref 2.5–4.6)

## 2021-01-16 LAB — MAGNESIUM: Magnesium: 2 mg/dL (ref 1.7–2.4)

## 2021-01-16 LAB — HEMOGLOBIN A1C
Hgb A1c MFr Bld: 6.4 % — ABNORMAL HIGH (ref 4.8–5.6)
Mean Plasma Glucose: 137 mg/dL

## 2021-01-16 LAB — OCCULT BLOOD X 1 CARD TO LAB, STOOL: Fecal Occult Bld: NEGATIVE

## 2021-01-16 MED ORDER — FENTANYL CITRATE (PF) 100 MCG/2ML IJ SOLN
INTRAMUSCULAR | Status: AC
  Filled 2021-01-16: qty 2

## 2021-01-16 MED ORDER — LIDOCAINE HCL (PF) 1 % IJ SOLN
INTRAMUSCULAR | Status: AC
  Filled 2021-01-16: qty 30

## 2021-01-16 MED ORDER — CHLORHEXIDINE GLUCONATE CLOTH 2 % EX PADS
6.0000 | MEDICATED_PAD | Freq: Every day | CUTANEOUS | Status: DC
  Administered 2021-01-16 – 2021-01-20 (×5): 6 via TOPICAL

## 2021-01-16 MED ORDER — MIDAZOLAM HCL 2 MG/2ML IJ SOLN
INTRAMUSCULAR | Status: AC
  Filled 2021-01-16: qty 2

## 2021-01-16 NOTE — Progress Notes (Signed)
PROGRESS NOTE    Hayley Fisher  DQQ:[id number] DOB: [birth year] DOA: 01/14/2021 PCP: Danna Hefty, DO   Brief Narrative:  Hayley Fisher is a 87 y.o. female with medical history significant of IDDM, hypothyroidism, hypertension, anemia, constipation, dementia was brought from SNF with decreased p.o. intake noted to decreased LOC for about a week or so. Apparently patient had a Foley placed on May 24 and was being treated for UTI with Macrobid.however upon arrival to the ED, patient was alert and oriented.  Hemodynamically stable.  Recently admitted in April 2022 for acute delirium and metabolic encephalopathy thought to be secondary to UTI and hypoglycemia this improved and she was able to be discharged home to [facility] on 15 December 2020.  Patient tends to get frequent UTIs and usually that causes her to get confused.  Assessment & Plan:   Active Problems:   Hyperlipidemia associated with type 2 diabetes mellitus ([place])   Skin ulcer of sacrum, limited to breakdown of skin ([place])   Uncontrolled type 2 diabetes mellitus with hyperglycemia (HCC)   Acute on chronic anemia   Acute encephalopathy   Hypothyroidism   Memory changes   Sepsis ([place])  Severe sepsis secondary to UTI, POA: Sepsis parameters resolved.  Culture negative.  Continue Rocephin.  Acute toxic encephalopathy secondary to UTI: Per family, she was alert and oriented yesterday.  However I have seen her twice since yesterday, she is alert but not oriented.  I wondered if she had dementia.  Daughter confirmed that patient does have dementia but she is not on any medications.  Based off of that, this could be just baseline for the patient.  Hypothyroidism: Continue Synthroid.  Acute on chronic anemia: Initially her hemoglobin was 7.7 and repeat hemoglobin was 6.6.  She received 2 units of PRBC transfusion and posttransfusion hemoglobin is 8.9.  And now 9.8.  FOBT x1 negative this time and it was negative during recent  hospitalization as well indicating that she likely does not have GI bleed.  Work-up so far indicates anemia of chronic disease.  Bilateral lower extremity DVT: Discussed with IR, she is planned for IVC filter today.  Hyperlipidemia: Continue Lipitor.  Type 2 diabetes mellitus: Blood sugar controlled.  Continue current regimen.  Mild to moderate protein calorie malnutrition: Prealbumin 6.5 only.  Dietitian consulted  Hypophosphatemia: Resolved.  Hypomagnesemia: Resolved  Stage I pressure ulcer at the sacrum: Management per wound care.  Pictures as below        DVT prophylaxis: SCDs Start: 01/15/21 0123   Code Status: Full Code  Family Communication: None present at bedside.  Plan of care discussed with patient's daughter/POA [name] over the phone.  Status is: Inpatient  Remains inpatient appropriate because:Inpatient level of care appropriate due to severity of illness   Dispo: The patient is from: SNF              Anticipated d/c is to: SNF              Patient currently is not medically stable to d/c.   Difficult to place patient No        Estimated body mass index is 27.7 kg/m as calculated from the following:   Height as of this encounter: 5\' 1"  (1.549 m).   Weight as of this encounter: 66.5 kg.      Nutritional status:               Consultants:   IR and palliative care  Procedures:   None  Antimicrobials:  Anti-infectives (From admission, onward)   Start     Dose/Rate Route Frequency Ordered Stop   01/16/21 2200  vancomycin (VANCOCIN) IVPB 1000 mg/200 mL premix  Status:  Discontinued        1,000 mg 200 mL/hr over 60 Minutes Intravenous Every 36 hours 01/15/21 0101 01/15/21 1418   01/15/21 1800  cefTRIAXone (ROCEPHIN) 1 g in sodium chloride 0.9 % 100 mL IVPB        1 g 200 mL/hr over 30 Minutes Intravenous Every 24 hours 01/15/21 1418     01/15/21 1200  ceFEPIme (MAXIPIME) 2 g in sodium chloride 0.9 % 100 mL IVPB  Status:   Discontinued        2 g 200 mL/hr over 30 Minutes Intravenous Every 12 hours 01/15/21 0101 01/15/21 1418   01/15/21 0800  metroNIDAZOLE (FLAGYL) IVPB 500 mg  Status:  Discontinued        500 mg 100 mL/hr over 60 Minutes Intravenous Every 8 hours 01/15/21 0051 01/15/21 1418   01/14/21 2345  ceFEPIme (MAXIPIME) 2 g in sodium chloride 0.9 % 100 mL IVPB        2 g 200 mL/hr over 30 Minutes Intravenous  Once 01/14/21 2334 01/15/21 0035   01/14/21 2345  metroNIDAZOLE (FLAGYL) IVPB 500 mg        500 mg 100 mL/hr over 60 Minutes Intravenous  Once 01/14/21 2334 01/15/21 0053   01/14/21 2345  vancomycin (VANCOCIN) IVPB 1000 mg/200 mL premix  Status:  Discontinued        1,000 mg 200 mL/hr over 60 Minutes Intravenous  Once 01/14/21 2334 01/14/21 2341   01/14/21 2345  vancomycin (VANCOREADY) IVPB 1500 mg/300 mL        1,500 mg 150 mL/hr over 120 Minutes Intravenous  Once 01/14/21 2341 01/15/21 0237   01/14/21 2330  cefTRIAXone (ROCEPHIN) 1 g in sodium chloride 0.9 % 100 mL IVPB  Status:  Discontinued        1 g 200 mL/hr over 30 Minutes Intravenous  Once 01/14/21 2322 01/14/21 2337         Subjective: Seen and examined.  She is alert and has no complaints.  Not oriented.  She is pleasant.  Objective: Vitals:   01/15/21 1731 01/15/21 2101 01/16/21 0109 01/16/21 0511  BP:  (!) 153/78 (!) 162/93 (!) 159/91  Pulse:  94 89 88  Resp:   20 16  Temp:  98.5 F (36.9 C) 98.6 F (37 C) 98.6 F (37 C)  TempSrc:  Oral Oral Oral  SpO2:  98% 100% 98%  Weight: 66.5 kg     Height: 5\' 1"  (1.549 m)       Intake/Output Summary (Last 24 hours) at 01/16/2021 1037 Last data filed at 01/16/2021 0858 Gross per 24 hour  Intake 1114.04 ml  Output 3750 ml  Net -2635.96 ml   Filed Weights   01/15/21 0037 01/15/21 1731  Weight: 63 kg 66.5 kg    Examination:  General exam: Appears calm and comfortable  Respiratory system: Clear to auscultation. Respiratory effort normal. Cardiovascular system: S1 &  S2 heard, RRR. No JVD, murmurs, rubs, gallops or clicks. No pedal edema. Gastrointestinal system: Abdomen is nondistended, soft and nontender. No organomegaly or masses felt. Normal bowel sounds heard. Central nervous system: Alert but not oriented. No focal neurological deficits. Extremities: Symmetric 5 x 5 power. Skin: stage I sacral decubitus ulcer, as pictured above Psychiatry: Judgement and insight poor   Data Reviewed: I have  personally reviewed following labs and imaging studies  CBC: Recent Labs  Lab 01/14/21 2209 01/15/21 0451 01/15/21 0944 01/16/21 0207  WBC 6.8 5.7 5.9 5.8  NEUTROABS 5.5 4.7  --   --   HGB 7.4* 6.6* 8.9* 9.8*  HCT 24.0* 21.1* 27.6* 30.9*  MCV 98.4 97.7 95.8 95.7  PLT 362 320 260 532   Basic Metabolic Panel: Recent Labs  Lab 01/14/21 2209 01/15/21 0451 01/16/21 0207  NA 133* 135 136  K 4.6 3.9 4.9  CL 100 105 103  CO2 22 25 28   GLUCOSE 92 70 220*  BUN 38* 29* 21  CREATININE 1.00 0.61 0.68  CALCIUM 8.5* 7.5* 8.2*  MG  --  1.2*  1.1* 2.0  PHOS  --  2.4*  2.4* 2.7   GFR: Estimated Creatinine Clearance: 43.3 mL/min (by C-G formula based on SCr of 0.68 mg/dL). Liver Function Tests: Recent Labs  Lab 01/14/21 2209 01/15/21 0451  AST 16 13*  ALT 9 8  ALKPHOS 87 67  BILITOT 1.2 1.1  PROT 6.0* 4.9*  ALBUMIN 2.4* 1.9*   Recent Labs  Lab 01/14/21 2209  LIPASE 21   No results for input(s): AMMONIA in the last 168 hours. Coagulation Profile: Recent Labs  Lab 01/15/21 0030  INR 1.1   Cardiac Enzymes: Recent Labs  Lab 01/15/21 0451  CKTOTAL 27*   BNP (last 3 results) No results for input(s): PROBNP in the last 8760 hours. HbA1C: Recent Labs    01/14/21 2209  HGBA1C 6.4*   CBG: Recent Labs  Lab 01/15/21 1516 01/15/21 2145 01/15/21 2348 01/16/21 0414 01/16/21 0731  GLUCAP 212* 235* 248* 159* 120*   Lipid Profile: No results for input(s): CHOL, HDL, LDLCALC, TRIG, CHOLHDL, LDLDIRECT in the last 72 hours. Thyroid  Function Tests: Recent Labs    01/15/21 0451  TSH 4.932*   Anemia Panel: Recent Labs    01/15/21 1429 01/15/21 1430  VITAMINB12 1,049*  --   FOLATE  --  9.7  FERRITIN 815*  --   TIBC 168*  --   IRON 105  --   RETICCTPCT 2.4  --    Sepsis Labs: Recent Labs  Lab 01/14/21 2153 01/15/21 0033 01/15/21 0451 01/15/21 0717  PROCALCITON  --   --  0.10  --   LATICACIDVEN 4.0* 3.4* 1.7 1.6    Recent Results (from the past 240 hour(s))  Urine Culture     Status: None   Collection Time: 01/14/21  9:52 PM   Specimen: Urine, Clean Catch  Result Value Ref Range Status   Specimen Description   Final    URINE, CLEAN CATCH Performed at [facility], [place] [address] [place], [place] [address]    Special Requests   Final    NONE Performed at [facility], [place] [address] [place], [place] [address]    Culture   Final    NO GROWTH Performed at [facility], [place] [address], [place],  [address]    Report Status 01/16/2021 FINAL  Final  Resp Panel by RT-PCR (Flu A&B, Covid) Urine, Bag (ped)     Status: None   Collection Time: 01/14/21  9:54 PM   Specimen: Urine, Bag (ped); Nasopharyngeal(NP) swabs in vial transport medium  Result Value Ref Range Status   SARS Coronavirus 2 by RT PCR NEGATIVE NEGATIVE Final    Comment: (NOTE) SARS-CoV-2 target nucleic acids are NOT DETECTED.  The SARS-CoV-2 RNA is generally detectable in upper respiratory  specimens during the acute phase of infection. The lowest concentration of SARS-CoV-2 viral copies this assay can detect is 138 copies/mL. A negative result does not preclude SARS-Cov-2 infection and should not be used as the sole basis for treatment or other patient management decisions. A negative result may occur with  improper specimen collection/handling, submission of specimen other than nasopharyngeal swab, presence of viral mutation(s) within the areas targeted by this assay, and  inadequate number of viral copies(<138 copies/mL). A negative result must be combined with clinical observations, patient history, and epidemiological information. The expected result is Negative.  Fact Sheet for Patients:  EntrepreneurPulse.com.au  Fact Sheet for Healthcare Providers:  IncredibleEmployment.be  This test is no t yet approved or cleared by the [place] FDA and  has been authorized for detection and/or diagnosis of SARS-CoV-2 by FDA under an Emergency Use Authorization (EUA). This EUA will remain  in effect (meaning this test can be used) for the duration of the COVID-19 declaration under Section 564(b)(1) of the Act, 21 U.S.C.section 360bbb-3(b)(1), unless the authorization is terminated  or revoked sooner.       Influenza A by PCR NEGATIVE NEGATIVE Final   Influenza B by PCR NEGATIVE NEGATIVE Final    Comment: (NOTE) The Xpert Xpress SARS-CoV-2/FLU/RSV plus assay is intended as an aid in the diagnosis of influenza from Nasopharyngeal swab specimens and should not be used as a sole basis for treatment. Nasal washings and aspirates are unacceptable for Xpert Xpress SARS-CoV-2/FLU/RSV testing.  Fact Sheet for Patients: EntrepreneurPulse.com.au  Fact Sheet for Healthcare Providers: IncredibleEmployment.be  This test is not yet approved or cleared by the [place] FDA and has been authorized for detection and/or diagnosis of SARS-CoV-2 by FDA under an Emergency Use Authorization (EUA). This EUA will remain in effect (meaning this test can be used) for the duration of the COVID-19 declaration under Section 564(b)(1) of the Act, 21 U.S.C. section 360bbb-3(b)(1), unless the authorization is terminated or revoked.  Performed at [facility], [place] [address], [place], [place] [address]   Culture, blood (Routine X 2) w Reflex to ID Panel     Status: None (Preliminary  result)   Collection Time: 01/14/21 10:09 PM   Specimen: BLOOD RIGHT FOREARM  Result Value Ref Range Status   Specimen Description   Final    BLOOD RIGHT FOREARM Performed at [facility], [place] [address], [place], [place] [address]    Special Requests   Final    BOTTLES DRAWN AEROBIC AND ANAEROBIC Blood Culture results may not be optimal due to an inadequate volume of blood received in culture bottles Performed at [place] [address] [place], [place] [address]    Culture   Final    NO GROWTH 1 DAY Performed at [facility], [place] [address], [place], [place] [address]    Report Status PENDING  Incomplete  Culture, blood (Routine X 2) w Reflex to ID Panel     Status: None (Preliminary result)   Collection Time: 01/14/21 10:09 PM   Specimen: BLOOD RIGHT FOREARM  Result Value Ref Range Status   Specimen Description   Final    BLOOD RIGHT FOREARM Performed at [facility], [place] [address], [place], [place] [address]    Special Requests   Final    BOTTLES DRAWN AEROBIC AND ANAEROBIC Blood Culture results may not be optimal due to an excessive volume of blood received in culture bottles Performed at [facility]  [place] [address], [place], [place] [address]    Culture   Final    NO GROWTH 1 DAY Performed at [facility], [place] [address], [place], [place] [address]    Report Status PENDING  Incomplete  MRSA PCR Screening     Status: None   Collection Time: 01/15/21  2:35 AM   Specimen: Nasal Mucosa; Nasopharyngeal  Result Value Ref Range Status   MRSA by PCR NEGATIVE NEGATIVE Final    Comment:        The GeneXpert MRSA Assay (FDA approved for NASAL specimens only), is one component of a comprehensive MRSA colonization surveillance program. It is not intended to diagnose MRSA infection nor to guide or monitor treatment for MRSA infections. Performed at [facility] [place] [address],  [place], [place] [address]       Radiology Studies: DG Abd 1 View  Result Date: 01/15/2021 CLINICAL DATA:  Constipation EXAM: ABDOMEN - 1 VIEW COMPARISON:  10/23/2020 FINDINGS: Normal abdominal gas pattern. Relatively small volume stool. No gross free intraperitoneal gas. Underpenetration precludes evaluation of the visceral shadows. Vascular calcifications noted within the abdominal aorta. IMPRESSION: Normal abdominal gas pattern. Electronically Signed   By: Fidela Salisbury MD   On: 01/15/2021 01:51   CT Head Wo Contrast  Result Date: 01/14/2021 CLINICAL DATA:  Mental status change. EXAM: CT HEAD WITHOUT CONTRAST TECHNIQUE: Contiguous axial images were obtained from the base of the skull through the vertex without intravenous contrast. COMPARISON:  CT head 09/18/2011. FINDINGS: Brain: Cerebral ventricle sizes are concordant with the degree of cerebral volume loss. Patchy and confluent areas of decreased attenuation are noted throughout the deep and periventricular white matter of the cerebral hemispheres bilaterally, compatible with chronic microvascular ischemic disease. No evidence of large-territorial acute infarction. No parenchymal hemorrhage. No mass lesion. No extra-axial collection. No mass effect or midline shift. No hydrocephalus. Basilar cisterns are patent. Empty sella. Findings is often a normal anatomic variant but can be associated with idiopathic intracranial hypertension (pseudotumor cerebri). Vascular: No hyperdense vessel. Atherosclerotic calcifications are present within the cavernous internal carotid arteries. Skull: No acute fracture. Similar-appearing permeative pattern of the clivus and sphenoid bone which is unchanged. Status post left mastoidectomy again noted. Sinuses/Orbits: Paranasal sinuses and mastoid air cells are clear. Lens replacement. Otherwise the orbits are unremarkable. Other: None. IMPRESSION: No acute intracranial abnormality. Electronically Signed   By: Iven Finn  M.D.   On: 01/14/2021 23:08   DG Chest Port 1 View  Result Date: 01/14/2021 CLINICAL DATA:  Failure to thrive EXAM: PORTABLE CHEST 1 VIEW COMPARISON:  12/01/2020 FINDINGS: Lung volumes are small and there is bibasilar atelectasis, unchanged from prior examination. No pneumothorax or pleural effusion. Cardiac size within normal limits. Pulmonary vascularity is normal. No acute bone abnormality. IMPRESSION: Stable pulmonary hypoinflation. Electronically Signed   By: Fidela Salisbury MD   On: 01/14/2021 22:26   VAS [place] LOWER EXTREMITY VENOUS (DVT)  Result Date: 01/15/2021  Lower Venous DVT Study Patient Name:  DANEILLE DESILVA  Date of Exam:   01/15/2021 Medical Rec #: [id number]       Accession #:    [id number] Date of Birth: [birth year]       Patient Gender: F Patient Age:   89Y Exam Location:  [facility] Procedure:      VAS [place] LOWER EXTREMITY VENOUS (DVT) Referring Phys: [place] --------------------------------------------------------------------------------  Indications: Edema.  Risk Factors: None identified. Limitations: Poor ultrasound/tissue interface and  patient positioning. Comparison Study: No prior studies. Performing Technologist: Oliver Hum RVT  Examination Guidelines: A complete evaluation includes B-mode imaging, spectral Doppler, color Doppler, and power Doppler as needed of all accessible portions of each vessel. Bilateral testing is considered an integral part of a complete examination. Limited examinations for reoccurring indications may be performed as noted. The reflux portion of the exam is performed with the patient in reverse Trendelenburg.  +---------+---------------+---------+-----------+----------+-------------------+ RIGHT    CompressibilityPhasicitySpontaneityPropertiesThrombus Aging      +---------+---------------+---------+-----------+----------+-------------------+ CFV      Full           Yes      Yes                                       +---------+---------------+---------+-----------+----------+-------------------+ SFJ      Full                                                             +---------+---------------+---------+-----------+----------+-------------------+ FV Prox  Full                                                             +---------+---------------+---------+-----------+----------+-------------------+ FV Mid   Full                                                             +---------+---------------+---------+-----------+----------+-------------------+ FV DistalFull                                                             +---------+---------------+---------+-----------+----------+-------------------+ PFV      Partial        Yes      Yes                  Age Indeterminate   +---------+---------------+---------+-----------+----------+-------------------+ POP      Full           Yes      Yes                                      +---------+---------------+---------+-----------+----------+-------------------+ PTV      Full                                                             +---------+---------------+---------+-----------+----------+-------------------+ PERO  Not well visualized +---------+---------------+---------+-----------+----------+-------------------+   +---------+---------------+---------+-----------+----------+-------------------+ LEFT     CompressibilityPhasicitySpontaneityPropertiesThrombus Aging      +---------+---------------+---------+-----------+----------+-------------------+ CFV      Full           Yes      Yes                                      +---------+---------------+---------+-----------+----------+-------------------+ SFJ      Full                                                             +---------+---------------+---------+-----------+----------+-------------------+ FV  Prox  Full                                                             +---------+---------------+---------+-----------+----------+-------------------+ FV Mid   Partial                                      Age Indeterminate   +---------+---------------+---------+-----------+----------+-------------------+ FV DistalFull                                                             +---------+---------------+---------+-----------+----------+-------------------+ PFV      Full                                                             +---------+---------------+---------+-----------+----------+-------------------+ POP      Full           Yes      Yes                                      +---------+---------------+---------+-----------+----------+-------------------+ PTV      Full                                                             +---------+---------------+---------+-----------+----------+-------------------+ PERO                                                  Not well visualized +---------+---------------+---------+-----------+----------+-------------------+     Summary: RIGHT: - Findings consistent with age indeterminate deep vein thrombosis involving the right proximal profunda vein. -  No cystic structure found in the popliteal fossa.  LEFT: - Findings consistent with age indeterminate deep vein thrombosis involving the left femoral vein. - No cystic structure found in the popliteal fossa.  *See table(s) above for measurements and observations. Electronically signed by Monica Martinez MD on 01/15/2021 at 12:35:38 PM.    Final     Scheduled Meds: . aspirin  81 mg Oral q morning  . atorvastatin  40 mg Oral Daily  . Chlorhexidine Gluconate Cloth  6 each Topical Daily  . docusate sodium  100 mg Oral BID  . dorzolamide-timolol  1 drop Both Eyes BID  . DULoxetine  60 mg Oral Daily  . famotidine  20 mg Oral Daily  . feeding supplement  237 mL Oral BID BM   . insulin aspart  0-9 Units Subcutaneous Q4H  . insulin glargine  15 Units Subcutaneous Daily  . latanoprost  1 drop Both Eyes QHS  . levothyroxine  50 mcg Oral Daily  . tamsulosin  0.4 mg Oral Daily   Continuous Infusions: . sodium chloride 75 mL/hr at 01/16/21 0457  . sodium chloride Stopped (01/14/21 2320)  . cefTRIAXone (ROCEPHIN)  IV 1 g (01/15/21 1906)     LOS: 1 day   Time spent: 30 minutes   Darliss Cheney, MD [facility]  01/16/2021, 10:37 AM   How to contact the [facility] Attending or Consulting provider [place] or covering provider during after hours [place], for this patient?  1. Check the care team in [facility] and look for a) attending/consulting TR[facility] provider listed and b) the [facility] team listed. Page or secure chat [phone]. 2. Log into [email or web address] and use [place]'s universal password to access. If you do not have the password, please contact the hospital operator. 3. Locate the [facility] provider you are looking for under [facility] and page to a number that you can be directly reached. 4. If you still have difficulty reaching the provider, please page the [facility] (Director on Call) for the Hospitalists listed on amion for assistance.

## 2021-01-16 NOTE — Progress Notes (Signed)
PT Cancellation Note  Patient Details Name: [name] MRN: [id number] DOB: [birth year]   Cancelled Treatment:    Reason Eval/Treat Not Completed: Medical issues which prohibited therapy, going for IVC, has bilateral leg DVT's.   Claretha Cooper 01/16/2021, 10:03 AM [place] Pager [phone] Office [phone]

## 2021-01-16 NOTE — Progress Notes (Signed)
OT Cancellation Note  Patient Details Name: Hayley Fisher MRN: [id number] DOB: Jul 22, 1931   Cancelled Treatment:    Reason Eval/Treat Not Completed: Medical issues which prohibited therapy. Pt with B LE DVT, plan for IVC filter. Will continue to follow.   Malka So 01/16/2021, 10:06 AM  Nestor Lewandowsky, OTR/L Acute Rehabilitation Services Pager: [phone] Office: [phone]

## 2021-01-16 NOTE — Consult Note (Signed)
Consultation Note Date: 01/16/2021   Patient Name: Hayley Fisher  DOB: August 16, 1931  MRN: [id number]  Age / Sex: 87 y.o., female  PCP: Danna Hefty, DO Referring Physician: Darliss Cheney, MD  Reason for Consultation: Establishing goals of care  HPI/Patient Profile: 88 y.o. female admitted on 01/14/2021   Clinical Assessment and Goals of Care: 87 year old lady came from nursing facility, has been recently seen and evaluated by all [facility] palliative at her facility.  Patient has past medical history significant for diabetes dyslipidemia glaucoma hypertension dementia.  More recently she has had recurrent urinary tract infections.  Patient admitted with shortness of breath decreased oral intake during work-up found to have lower extremity venous Doppler study showing bilateral age-indeterminate deep vein thrombosis along with low hemoglobin of 6.6 g/dL.  Patient admitted to hospital medicine service given packed red blood cell transfusions, seen and evaluated by interventional radiology and to be considered for IVC filter placement.  Palliative service inpatient consultation requested for continuation of goals of care discussions.  Patient is awake alert resting in bed.  She is able to answer a few simple questions.  She does not know why she is in the hospital.  She denies chest discomfort or shortness of breath.  Call placed and I was able to reach daughter [name] who is noted to be her healthcare power of attorney agent. I introduced myself and palliative care as follows: Palliative medicine is specialized medical care for people living with serious illness. It focuses on providing relief from the symptoms and stress of a serious illness. The goal is to improve quality of life for both the patient and the family.  Goals of care: Broad aims of medical therapy in relation to the patient's values and  preferences. Our aim is to provide medical care aimed at enabling patients to achieve the goals that matter most to them, given the circumstances of their particular medical situation and their constraints.   We reviewed about scope of current hospitalization.  Discussed about impending IVC filter placement today.  Discussed about daughter's thoughts from recent outpatient palliative consultation.  CODE STATUS discussions had been undertaken.  We reviewed again about differences between full code versus DO NOT RESUSCITATE.  [name] states that she is continuing the discussions with her other 2 siblings and patient remains full code at this time.  [name] asked about the patient's hemoglobin and requested some more medical data from the chart that was provided on the phone.  SLP has seen the patient and has requested regular diet with thin liquids.  See below.  HCPOA Daughter [name] is noted to be healthcare power of attorney agent.    SUMMARY OF RECOMMENDATIONS   Full code/full scope for now Continue current mode of care, IVC filter placement today Recommend continuation of palliative services at patient's facility upon discharge for ongoing goals of care discussions and CODE STATUS discussions. Thank you for the consult.  Code Status/Advance Care Planning:  Full code    Symptom Management:  Palliative Prophylaxis:   Delirium Protocol  Additional Recommendations (Limitations, Scope, Preferences):  Full Scope Treatment  Psycho-social/Spiritual:   Desire for further Chaplaincy support:yes  Additional Recommendations: Caregiving  Support/Resources  Prognosis:   Unable to determine  Discharge Planning: [place] for rehab with Palliative care service follow-up      Primary Diagnoses: Present on Admission: . Sepsis ([place]) . Uncontrolled type 2 diabetes mellitus with hyperglycemia ([place]) . Memory changes . Hypothyroidism . Hyperlipidemia  associated with type 2 diabetes mellitus ([place]) . Acute on chronic anemia . Acute encephalopathy . Skin ulcer of sacrum, limited to breakdown of skin ([place])   I have reviewed the medical record, interviewed the patient and family, and examined the patient. The following aspects are pertinent.  Past Medical History:  Diagnosis Date  . Arthritis   . Arthritis   . Cataract    bil cateracts removed  . Chronic kidney disease    "spot on one of my kidneys" per pt  . Diabetes mellitus   . Glaucoma   . Hyperkalemia 07/02/2020  . Hyperlipidemia   . Hyperplastic colon polyp   . Hypertension   . Hypothyroidism 12/09/2020  . Metabolic acidosis 50/35/4656  . Syncope   . Thyroid disease    Social History   Socioeconomic History  . Marital status: Single    Spouse name: Not on file  . Number of children: 4  . Years of education: Not on file  . Highest education level: Not on file  Occupational History  . Occupation: retired  Tobacco Use  . Smoking status: Former Smoker    Types: Cigarettes    Quit date: 11/29/1980    Years since quitting: 40.1  . Smokeless tobacco: Never Used  Vaping Use  . Vaping Use: Never used  Substance and Sexual Activity  . Alcohol use: No    Alcohol/week: 0.0 standard drinks  . Drug use: No  . Sexual activity: Yes    Birth control/protection: None  Other Topics Concern  . Not on file  Social History Narrative  . Not on file   Social Determinants of Health   Financial Resource Strain: Not on file  Food Insecurity: Not on file  Transportation Needs: Not on file  Physical Activity: Not on file  Stress: Not on file  Social Connections: Not on file   Family History  Problem Relation Age of Onset  . Colon cancer Mother   . Other Father        cerebral hemorrhage  . Diabetes Brother   . Diabetes Sister        x 2  . Bone cancer Daughter   . Breast cancer Daughter 32  . Esophageal cancer Neg Hx   . Rectal cancer Neg Hx   . Stomach cancer Neg  Hx    Scheduled Meds: . aspirin  81 mg Oral q morning  . atorvastatin  40 mg Oral Daily  . Chlorhexidine Gluconate Cloth  6 each Topical Daily  . docusate sodium  100 mg Oral BID  . dorzolamide-timolol  1 drop Both Eyes BID  . DULoxetine  60 mg Oral Daily  . famotidine  20 mg Oral Daily  . feeding supplement  237 mL Oral BID BM  . insulin aspart  0-9 Units Subcutaneous Q4H  . insulin glargine  15 Units Subcutaneous Daily  . latanoprost  1 drop Both Eyes QHS  . levothyroxine  50 mcg Oral Daily  . tamsulosin  0.4 mg Oral Daily  Continuous Infusions: . sodium chloride 75 mL/hr at 01/16/21 0457  . sodium chloride Stopped (01/14/21 2320)  . cefTRIAXone (ROCEPHIN)  IV 1 g (01/15/21 1906)   PRN Meds:.acetaminophen **OR** acetaminophen, bisacodyl, HYDROcodone-acetaminophen, polyethylene glycol, witch hazel-glycerin Medications Prior to Admission:  Prior to Admission medications   Medication Sig Start Date End Date Taking? Authorizing Provider  ACCU-CHEK AVIVA PLUS test strip USE TO TEST BLOOD SUGAR UP TO 3 TIMES A DAY. Patient taking differently: 1 each by Other route See admin instructions. USE TO TEST BLOOD SUGAR UP  TO 3 TIMES A DAY. 06/30/20  Yes Mullis, Kiersten P, DO  Accu-Chek Softclix Lancets lancets USE TO TEST BLOOD SUGAR UP TO 3 TIMES A DAY. Patient taking differently: 1 each by Other route See admin instructions. USE TO TEST BLOOD SUGAR UP  TO 3 TIMES A DAY. 06/30/20  Yes Mullis, Kiersten P, DO  Alcohol Swabs PADS 1 Piece by Does not apply route daily. 08/10/20  Yes Mullis, Kiersten P, DO  aspirin 81 MG chewable tablet Chew 81 mg by mouth every morning.   Yes [provider]  atorvastatin (LIPITOR) 40 MG tablet Take 1 tablet (40 mg total) by mouth daily. 09/21/20  Yes Mullis, Kiersten P, DO  brimonidine (ALPHAGAN P) 0.1 % SOLN Place 1 drop into both eyes 2 (two) times daily. 09/20/11  Yes   carbamide peroxide (DEBROX) 6.5 % OTIC solution Place 5 drops into both ears 2  (two) times daily. Patient taking differently: Place 2 drops into both ears 2 (two) times daily as needed (ear wax). 10/16/18  Yes Alveda Reasons, MD  Catheter Self-Adhesive Urinary MISC 1 patch by Does not apply route daily. 10/26/20  Yes Anderson, Chelsey L, DO  docusate sodium (COLACE) 100 MG capsule Take 1 capsule (100 mg total) by mouth 2 (two) times daily. 12/15/20  Yes Harold Hedge, MD  dorzolamide-timolol (COSOPT) 22.3-6.8 MG/ML ophthalmic solution Place 1 drop into both eyes 2 (two) times daily. 05/18/20  Yes [provider]  DULoxetine (CYMBALTA) 60 MG capsule TAKE 1 CAPSULE BY MOUTH EVERY DAY Patient taking differently: Take 60 mg by mouth daily. 10/22/18  Yes Harriet Butte, DO  enalapril (VASOTEC) 20 MG tablet Take 1 tablet (20 mg total) by mouth daily. 09/21/20  Yes Mullis, Kiersten P, DO  famotidine (PEPCID) 20 MG tablet Take 1 tablet (20 mg total) by mouth daily. If needed can take twice a day. 09/16/20  Yes Mullis, Kiersten P, DO  ferrous sulfate 325 (65 FE) MG tablet Take 325 mg by mouth daily with breakfast.   Yes [provider]  insulin glargine (LANTUS) 100 UNIT/ML injection Inject 0.15 mLs (15 Units total) into the skin daily. 12/15/20  Yes Harold Hedge, MD  insulin lispro (HUMALOG) 100 UNIT/ML injection Inject 2-15 Units into the skin See admin instructions. Per sliding scale, subcutaneous with meals if blood sugar is 70-150 = 0 units  151-200 = 2 units  201-250 = 5 units  251-300 = 8 units  301-350 = 11 units  351-400 = 15 units  >400 = Call MD.   Inject units into skin according to the sliding scale three times daily with meals.   Yes [provider]  ketoconazole (NIZORAL) 2 % cream Apply 1 application topically daily. 11/19/20  Yes McDonald, Stephan Minister, DPM  levothyroxine (SYNTHROID) 50 MCG tablet Take 1 tablet (50 mcg total) by mouth daily. 11/10/20  Yes Renato Shin, MD  metFORMIN (GLUCOPHAGE) 1000 MG tablet Take 1,000 mg  by mouth daily. 01/11/21   Yes [provider]  mirabegron ER (MYRBETRIQ) 25 MG TB24 tablet Take 1 tablet (25 mg total) by mouth daily. 09/16/20  Yes Mullis, Kiersten P, DO  Multiple Vitamins-Minerals (PRESERVISION AREDS 2+MULTI VIT) CAPS Take 1 capsule by mouth daily.   Yes [provider]  ondansetron (ZOFRAN ODT) 4 MG disintegrating tablet Take 1 tablet (4 mg total) by mouth every 8 (eight) hours as needed for nausea. 07/28/20  Yes Larene Pickett, PA-C  polyethylene glycol powder (GLYCOLAX/MIRALAX) 17 GM/SCOOP powder Take 17 g by mouth daily. 12/15/20  Yes Harold Hedge, MD  Semaglutide (RYBELSUS) 3 MG TABS Take 3 mg by mouth daily. 11/09/20  Yes Renato Shin, MD  SURE COMFORT INSULIN SYRINGE 31G X 5/16" 0.3 ML MISC use 1 syringe with insulin daily Patient taking differently: Inject 1 Syringe into the skin daily. 11/12/20  Yes Mullis, Kiersten P, DO  tamsulosin (FLOMAX) 0.4 MG CAPS capsule Take 0.4 mg by mouth daily. 10/08/20  Yes [provider]  VYZULTA 0.024 % SOLN Place 1 drop into both eyes at bedtime. 11/06/17  Yes [provider]  witch hazel-glycerin (TUCKS) pad Apply topically as needed for itching. 12/15/20  Yes Harold Hedge, MD  metFORMIN (GLUCOPHAGE-XR) 500 MG 24 hr tablet Take 2 tablets (1,000 mg total) by mouth every morning. Patient not taking: Reported on 01/15/2021 11/09/20   Renato Shin, MD   Allergies  Allergen Reactions  . Levemir [Insulin Detemir] Itching   Review of Systems +weakness  Physical Exam Elderly lady resting in bed she is able to state her name, she is not fully oriented.  Denies pain Regular breath sounds S1-S2 She has 1-2+ bilateral lower extremity edema Chart reviewed, pictures of stage I pressure ulcer of the sacrum area present on admission noted Abdomen is not tender  Vital Signs: BP (!) 159/91 (BP Location: Left Arm)   Pulse 88   Temp 98.6 F (37 C) (Oral)   Resp 16   Ht 5\' 1"  (1.549 m)   Wt 66.5 kg   SpO2 98%   BMI 27.70 kg/m   Pain Scale: 0-10   Pain Score: 0-No pain   SpO2: SpO2: 98 % O2 Device:SpO2: 98 % O2 Flow Rate: .   IO: Intake/output summary:   Intake/Output Summary (Last 24 hours) at 01/16/2021 1017 Last data filed at 01/16/2021 0858 Gross per 24 hour  Intake 1114.04 ml  Output 3750 ml  Net -2635.96 ml    LBM: Last BM Date: 01/15/21 Baseline Weight: Weight: 63 kg Most recent weight: Weight: 66.5 kg     Palliative Assessment/Data:   PPS 40%  Time In:  9.20 Time Out:  10.20 Time Total:  60 min.  Greater than 50%  of this time was spent counseling and coordinating care related to the above assessment and plan.  Signed by: Loistine Chance, MD   Please contact Palliative Medicine Team phone at [phone] for questions and concerns.  For individual provider: See Shea Evans

## 2021-01-16 NOTE — Plan of Care (Signed)

## 2021-01-16 NOTE — Procedures (Signed)
Pre procedural Dx: DVT with contraindication to anticoagulation. Post Procedural Dx: Same  Successful placement of an infrarenal IVC filter via the R IJ.  EBL: Trace  Complications: None immediate  Ronny Bacon, MD Pager #: [phone]

## 2021-01-17 DIAGNOSIS — G9340 Encephalopathy, unspecified: Secondary | ICD-10-CM | POA: Diagnosis not present

## 2021-01-17 LAB — CBC
HCT: 30.7 % — ABNORMAL LOW (ref 36.0–46.0)
Hemoglobin: 9.8 g/dL — ABNORMAL LOW (ref 12.0–15.0)
MCH: 30.7 pg (ref 26.0–34.0)
MCHC: 31.9 g/dL (ref 30.0–36.0)
MCV: 96.2 fL (ref 80.0–100.0)
Platelets: 316 10*3/uL (ref 150–400)
RBC: 3.19 MIL/uL — ABNORMAL LOW (ref 3.87–5.11)
RDW: 15.7 % — ABNORMAL HIGH (ref 11.5–15.5)
WBC: 5.1 10*3/uL (ref 4.0–10.5)
nRBC: 0 % (ref 0.0–0.2)

## 2021-01-17 LAB — GLUCOSE, CAPILLARY
Glucose-Capillary: 156 mg/dL — ABNORMAL HIGH (ref 70–99)
Glucose-Capillary: 266 mg/dL — ABNORMAL HIGH (ref 70–99)
Glucose-Capillary: 339 mg/dL — ABNORMAL HIGH (ref 70–99)
Glucose-Capillary: 339 mg/dL — ABNORMAL HIGH (ref 70–99)
Glucose-Capillary: 349 mg/dL — ABNORMAL HIGH (ref 70–99)
Glucose-Capillary: 63 mg/dL — ABNORMAL LOW (ref 70–99)
Glucose-Capillary: 77 mg/dL (ref 70–99)
Glucose-Capillary: 78 mg/dL (ref 70–99)
Glucose-Capillary: 90 mg/dL (ref 70–99)

## 2021-01-17 LAB — BASIC METABOLIC PANEL
Anion gap: 4 — ABNORMAL LOW (ref 5–15)
BUN: 15 mg/dL (ref 8–23)
CO2: 28 mmol/L (ref 22–32)
Calcium: 8.1 mg/dL — ABNORMAL LOW (ref 8.9–10.3)
Chloride: 107 mmol/L (ref 98–111)
Creatinine, Ser: 0.57 mg/dL (ref 0.44–1.00)
GFR, Estimated: >60 mL/min (ref >60)
Glucose, Bld: 87 mg/dL (ref 70–99)
Potassium: 4.2 mmol/L (ref 3.5–5.1)
Sodium: 139 mmol/L (ref 135–145)

## 2021-01-17 MED ORDER — AMLODIPINE BESYLATE 5 MG PO TABS
5.0000 mg | ORAL_TABLET | Freq: Every day | ORAL | Status: DC
  Administered 2021-01-17 – 2021-01-20 (×4): 5 mg via ORAL
  Filled 2021-01-17 (×4): qty 1

## 2021-01-17 NOTE — Evaluation (Signed)
Occupational Therapy Evaluation Patient Details Name: Hayley Fisher MRN: [id number] DOB: 1930-11-13 Today's Date: 01/17/2021    History of Present Illness 87 y.o. female brought from SNF with decreased p.o. intake noted to decreased LOC for about a week or so. admitted with sepsis d/t UTI and bil DVT; infrarenal IVC filter placed 5/28. PMH includes with medical history significant of IDDM, hypothyroidism, hypertension, anemia, constipation, dementia   Clinical Impression   Patient is pleasant however poor historian with hx of dementia. Knows she is in [place]. Per chart patient admitted from [facility], unsure if for rehab vs if patient has transitioned to [facility] resident. Patient denies using a walker for mobility. Currently patient presents with weakness, decreased activity tolerance, balance and safety. Needing increased time for processing and to initiate bed mobility with mod A and transfer to recliner with walker and min x2. Recommend continued acute OT services to maximize endurance, balance and safety to reduce caregiver burden with self care. Would recommend return to SNF if family is unable to provide current assist levels and provide 24/7 supervision.     Follow Up Recommendations  SNF    Equipment Recommendations  None recommended by OT       Precautions / Restrictions Precautions Precautions: Fall Restrictions Weight Bearing Restrictions: No      Mobility Bed Mobility Overal bed mobility: Needs Assistance Bed Mobility: Supine to Sit     Supine to sit: Mod assist;HOB elevated     General bed mobility comments: needing increased cues to initiate up to edge of bed, mod A for trunk support and initiate mobilizing LEs    Transfers Overall transfer level: Needs assistance Equipment used: Rolling walker (2 wheeled) Transfers: Sit to/from [facility] Sit to Stand: Min assist;+2 physical assistance;+2 safety/equipment Stand pivot transfers: Min  assist;+2 physical assistance;+2 safety/equipment       General transfer comment: first attempt patient unable to extend posture to upright position. with second attempt and cues bring LEs underneath her patient able to power up to standing with min x2. patient needing assist to stabilize and sequence taking few steps to recliner, limited eccentric control into chair    Balance Overall balance assessment: Needs assistance Sitting-balance support: Feet supported Sitting balance-Leahy Scale: Fair     Standing balance support: Bilateral upper extremity supported Standing balance-Leahy Scale: Poor Standing balance comment: reliant on UE support                           ADL either performed or assessed with clinical judgement   ADL Overall ADL's : Needs assistance/impaired Eating/Feeding: Set up;Sitting Eating/Feeding Details (indicate cue type and reason): needed assist to open container, able to eat finger food Grooming: Set up;Sitting   Upper Body Bathing: Minimal assistance;Sitting   Lower Body Bathing: Moderate assistance;Sitting/lateral leans;Sit to/from stand   Upper Body Dressing : Minimal assistance;Sitting   Lower Body Dressing: Maximal assistance;Sit to/from stand;Sitting/lateral leans Lower Body Dressing Details (indicate cue type and reason): poor standing balance/tolerance, limited balance Toilet Transfer: Minimal assistance;+2 for physical assistance;+2 for safety/equipment;Cueing for safety;Cueing for sequencing;RW Toilet Transfer Details (indicate cue type and reason): to recliner chair; needs assist to power up to standing and stabilize. cues to sequence taking steps over toward recliner chair, limited eccentric control into chair. appears to fatigue quickly Toileting- Clothing Manipulation and Hygiene: Maximal assistance;Sit to/from stand;Sitting/lateral lean       Functional mobility during ADLs: Minimal assistance;+2 for physical assistance;+2 for  safety/equipment;Cueing  for sequencing;Rolling walker General ADL Comments: unsure of patient's baseline however currently requiring mod to max A for LB ADL and min x2 for functional transfers due to weakness, limited balance, activity tolerance                  Pertinent Vitals/Pain Pain Assessment: Faces Faces Pain Scale: Hurts a little bit Pain Location: generalized Pain Descriptors / Indicators: Moaning Pain Intervention(s): Monitored during session     Hand Dominance Right   Extremity/Trunk Assessment Upper Extremity Assessment Upper Extremity Assessment: Generalized weakness;RUE deficits/detail RUE Deficits / Details: note patient keeping 2nd-5th digits in flexed position, states her fingers have been like this a "long time" with AAROM lacking extension ~ 25 degrees at PIP joints   Lower Extremity Assessment Lower Extremity Assessment: Defer to PT evaluation   Cervical / Trunk Assessment Cervical / Trunk Assessment: Kyphotic   Communication Communication Communication: No difficulties   Cognition Arousal/Alertness: Awake/alert Behavior During Therapy: WFL for tasks assessed/performed Overall Cognitive Status: History of cognitive impairments - at baseline                                 General Comments: patient needing increased processing time with directions, knows she is in [place] expects to be discharged to:: Unsure                                 Additional Comments: per chart patient coming [facility]      Prior Functioning/Environment          Comments: patient is pleasantly confused unable to provide history. patient denies using an assistive device for ambulation. per chart from [facility] place unsure if this is for rehab vs LTC resident        OT Problem List: Decreased strength;Decreased activity tolerance;Impaired balance (sitting and/or standing);Decreased  safety awareness      OT Treatment/Interventions: Therapeutic exercise;Self-care/ADL training;Therapeutic activities;Patient/family education;Balance training    OT Goals(Current goals can be found in the care plan section) Acute Rehab OT Goals Patient Stated Goal: agreeable to get up to chair OT Goal Formulation: With patient Time For Goal Achievement: 01/31/21 Potential to Achieve Goals: Good  OT Frequency: Min 2X/week           Co-evaluation PT/OT/SLP Co-Evaluation/Treatment: Yes Reason for Co-Treatment: To address functional/ADL transfers;For patient/therapist safety   OT goals addressed during session: ADL's and self-care      AM-PAC OT "6 Clicks" Daily Activity     Outcome Measure Help from another person eating meals?: A Little Help from another person taking care of personal grooming?: A Little Help from another person toileting, which includes using toliet, bedpan, or urinal?: A Lot Help from another person bathing (including washing, rinsing, drying)?: A Lot Help from another person to put on and taking off regular upper body clothing?: A Little Help from another person to put on and taking off regular lower body clothing?: A Lot 6 Click Score: 15   End of Session Equipment Utilized During Treatment: Rolling walker Nurse Communication: Mobility status  Activity Tolerance: Patient tolerated treatment well Patient left: in chair;with call bell/phone within reach;with chair alarm set  OT Visit Diagnosis: Unsteadiness on feet (R26.81);Other abnormalities of gait and mobility (R26.89)  Time: 5183-4373 OT Time Calculation (min): 14 min Charges:  OT General Charges $OT Visit: 1 Visit OT Evaluation $OT Eval Low Complexity: [place] OT OT pager: [place] 01/17/2021, 11:44 AM

## 2021-01-17 NOTE — Progress Notes (Signed)
Initial Nutrition Assessment  DOCUMENTATION CODES:   Not applicable  INTERVENTION:    Ensure Enlive po BID, each supplement provides 350 kcal and 20 grams of protein  MVI daily   NUTRITION DIAGNOSIS:   Increased nutrient needs related to acute illness as evidenced by estimated needs.  GOAL:   Patient will meet greater than or equal to 90% of their needs  MONITOR:   PO intake,Supplement acceptance,Weight trends,Labs,I & O's  REASON FOR ASSESSMENT:   Consult Assessment of nutrition requirement/status  ASSESSMENT:   Patient with PMH significant for DM, HTN, dementia, and CKD. Presents this admission with sepsis secondary to UTI.  Unable to obtain nutrition/weight history from patient given history of dementia. Last meal completion this admission charted as 75%. Recommend continued liberalized diet given advanced age. RD to provide supplementation to maximize kcal and protein.   Weight noted to increase from from 59.8 kg on 4/21 to 66.5 kg this admission. Will need to obtain NFPE to assess for malnutrition.   Height noted to be 5'5 vs 5'1.    Medications: colace, SS novolog, lantus Labs: CBG 63-175  Diet Order:   Diet Order            Diet regular Room service appropriate? Yes; Fluid consistency: Thin  Diet effective now                 EDUCATION NEEDS:   Not appropriate for education at this time  Skin:  Skin Assessment: Reviewed RN Assessment  Last BM:  5/27  Height:   Ht Readings from Last 1 Encounters:  01/17/21 5\' 5"  (1.651 m)    Weight:   Wt Readings from Last 1 Encounters:  01/15/21 66.5 kg    BMI:  Body mass index is 24.4 kg/m.  Estimated Nutritional Needs:   Kcal:  1700-1900 kcal  Protein:  85-100 grams  Fluid:  >/= 1.7 L/day  Mariana Single RD, LDN Clinical Nutrition Pager listed in [place]

## 2021-01-17 NOTE — Progress Notes (Signed)
PROGRESS NOTE    Hayley Fisher  WVP:[id number] DOB: 02-24-1931 DOA: 01/14/2021 PCP: Danna Hefty, DO   Brief Narrative:  Hayley Fisher is a 87 y.o. female with medical history significant of IDDM, hypothyroidism, hypertension, anemia, constipation, dementia was brought from SNF with decreased p.o. intake noted to decreased LOC for about a week or so. Apparently patient had a Foley placed on May 24 and was being treated for UTI with Macrobid.however upon arrival to the ED, patient was alert and oriented.  Hemodynamically stable.  Recently admitted in April 2022 for acute delirium and metabolic encephalopathy thought to be secondary to UTI and hypoglycemia this improved and she was able to be discharged home to [facility] on 15 December 2020.  Patient tends to get frequent UTIs and usually that causes her to get confused.  Assessment & Plan:   Active Problems:   Hyperlipidemia associated with type 2 diabetes mellitus ([place])   Skin ulcer of sacrum, limited to breakdown of skin ([place])   Uncontrolled type 2 diabetes mellitus with hyperglycemia (H[facility])   Acute on chronic anemia   Acute encephalopathy   Hypothyroidism   Memory changes   Sepsis ([place])  Severe sepsis secondary to UTI, POA: Sepsis parameters resolved.  Culture negative.  Continue Rocephin and discontinue after today's dose which will be day 3 or 4.  Acute toxic encephalopathy secondary to UTI: Patient remains alert as usual.  She is slightly more oriented today.  Hypothyroidism: Continue Synthroid.  Acute on chronic anemia: Initially her hemoglobin was 7.7 and repeat hemoglobin was 6.6.  She received 2 units of PRBC transfusion and posttransfusion hemoglobin is 8.9.  And now 9.8.  FOBT x1 negative this time and it was negative during recent hospitalization as well indicating that she likely does not have GI bleed.  Work-up so far indicates anemia of chronic disease.  Essential hypertension: Blood pressure elevated.  Does not  appear to be on any medications prior to admission.  Will initiate on amlodipine 5 mg.  Bilateral lower extremity DVT: Status post IVC filter by IR on 01/16/2021.  Hyperlipidemia: Continue Lipitor.  Type 2 diabetes mellitus: Blood sugar labile, hypoglycemic this morning and now hyperglycemic.  Continue current regimen.  Mild to moderate protein calorie malnutrition: Prealbumin 6.5 only.  Dietitian consulted  Hypophosphatemia: Resolved.  Hypomagnesemia: Resolved  Stage I pressure ulcer at the sacrum: Management per wound care.  Pictures as below      Generalized weakness: Seen by OT and they recommend SNF.  PT to see patient.  Will likely need to go to SNF.  DVT prophylaxis: SCDs Start: 01/15/21 0123   Code Status: Full Code  Family Communication: None present at bedside.   Status is: Inpatient  Remains inpatient appropriate because:Inpatient level of care appropriate due to severity of illness   Dispo: The patient is from: SNF              Anticipated d/c is to: SNF              Patient currently is not medically stable to d/c.   Difficult to place patient No        Estimated body mass index is 24.4 kg/m as calculated from the following:   Height as of this encounter: 5\' 5"  (1.651 m).   Weight as of this encounter: 66.5 kg.      Nutritional status:  Nutrition Problem: Increased nutrient needs Etiology: acute illness   Signs/Symptoms: estimated needs   Interventions: Ensure Enlive (each supplement  provides 350kcal and 20 grams of protein)    Consultants:   IR and palliative care  Procedures:   None  Antimicrobials:  Anti-infectives (From admission, onward)   Start     Dose/Rate Route Frequency Ordered Stop   01/16/21 2200  vancomycin (VANCOCIN) IVPB 1000 mg/200 mL premix  Status:  Discontinued        1,000 mg 200 mL/hr over 60 Minutes Intravenous Every 36 hours 01/15/21 0101 01/15/21 1418   01/15/21 1800  cefTRIAXone (ROCEPHIN) 1 g in sodium  chloride 0.9 % 100 mL IVPB        1 g 200 mL/hr over 30 Minutes Intravenous Every 24 hours 01/15/21 1418     01/15/21 1200  ceFEPIme (MAXIPIME) 2 g in sodium chloride 0.9 % 100 mL IVPB  Status:  Discontinued        2 g 200 mL/hr over 30 Minutes Intravenous Every 12 hours 01/15/21 0101 01/15/21 1418   01/15/21 0800  metroNIDAZOLE (FLAGYL) IVPB 500 mg  Status:  Discontinued        500 mg 100 mL/hr over 60 Minutes Intravenous Every 8 hours 01/15/21 0051 01/15/21 1418   01/14/21 2345  ceFEPIme (MAXIPIME) 2 g in sodium chloride 0.9 % 100 mL IVPB        2 g 200 mL/hr over 30 Minutes Intravenous  Once 01/14/21 2334 01/15/21 0035   01/14/21 2345  metroNIDAZOLE (FLAGYL) IVPB 500 mg        500 mg 100 mL/hr over 60 Minutes Intravenous  Once 01/14/21 2334 01/15/21 0053   01/14/21 2345  vancomycin (VANCOCIN) IVPB 1000 mg/200 mL premix  Status:  Discontinued        1,000 mg 200 mL/hr over 60 Minutes Intravenous  Once 01/14/21 2334 01/14/21 2341   01/14/21 2345  vancomycin (VANCOREADY) IVPB 1500 mg/300 mL        1,500 mg 150 mL/hr over 120 Minutes Intravenous  Once 01/14/21 2341 01/15/21 0237   01/14/21 2330  cefTRIAXone (ROCEPHIN) 1 g in sodium chloride 0.9 % 100 mL IVPB  Status:  Discontinued        1 g 200 mL/hr over 30 Minutes Intravenous  Once 01/14/21 2322 01/14/21 2337         Subjective: Seen and examined.  She has no complaints.  She states that she feels better today and she looks more alert and oriented today.  Objective: Vitals:   01/16/21 1145 01/16/21 2013 01/17/21 0408 01/17/21 0931  BP: (!) 166/85 133/79 (!) 161/83   Pulse: 82 (!) 103 88   Resp: 18 18 20    Temp: 97.6 F (36.4 C) 98.8 F (37.1 C) 98.7 F (37.1 C)   TempSrc: Oral  Oral   SpO2: 98% 97% 99%   Weight:      Height:    5\' 5"  (1.651 m)    Intake/Output Summary (Last 24 hours) at 01/17/2021 1156 Last data filed at 01/17/2021 1000 Gross per 24 hour  Intake 690 ml  Output 1900 ml  Net -1210 ml   Filed  Weights   01/15/21 0037 01/15/21 1731  Weight: 63 kg 66.5 kg    Examination:  General exam: Appears calm and comfortable  Respiratory system: Clear to auscultation. Respiratory effort normal. Cardiovascular system: S1 & S2 heard, RRR. No JVD, murmurs, rubs, gallops or clicks. No pedal edema. Gastrointestinal system: Abdomen is nondistended, soft and nontender. No organomegaly or masses felt. Normal bowel sounds heard. Central nervous system: Alert and oriented. No focal neurological  deficits. Extremities: Symmetric 5 x 5 power. Skin: No rashes, lesions or ulcers.  Psychiatry: Judgement and insight appear poor.  Data Reviewed: I have personally reviewed following labs and imaging studies  CBC: Recent Labs  Lab 01/14/21 2209 01/15/21 0451 01/15/21 0944 01/16/21 0207 01/17/21 0344  WBC 6.8 5.7 5.9 5.8 5.1  NEUTROABS 5.5 4.7  --   --   --   HGB 7.4* 6.6* 8.9* 9.8* 9.8*  HCT 24.0* 21.1* 27.6* 30.9* 30.7*  MCV 98.4 97.7 95.8 95.7 96.2  PLT 362 320 260 313 660   Basic Metabolic Panel: Recent Labs  Lab 01/14/21 2209 01/15/21 0451 01/16/21 0207 01/17/21 0344  NA 133* 135 136 139  K 4.6 3.9 4.9 4.2  CL 100 105 103 107  CO2 22 25 28 28   GLUCOSE 92 70 220* 87  BUN 38* 29* 21 15  CREATININE 1.00 0.61 0.68 0.57  CALCIUM 8.5* 7.5* 8.2* 8.1*  MG  --  1.2*  1.1* 2.0  --   PHOS  --  2.4*  2.4* 2.7  --    GFR: Estimated Creatinine Clearance: 44.6 mL/min (by C-G formula based on SCr of 0.57 mg/dL). Liver Function Tests: Recent Labs  Lab 01/14/21 2209 01/15/21 0451  AST 16 13*  ALT 9 8  ALKPHOS 87 67  BILITOT 1.2 1.1  PROT 6.0* 4.9*  ALBUMIN 2.4* 1.9*   Recent Labs  Lab 01/14/21 2209  LIPASE 21   No results for input(s): AMMONIA in the last 168 hours. Coagulation Profile: Recent Labs  Lab 01/15/21 0030  INR 1.1   Cardiac Enzymes: Recent Labs  Lab 01/15/21 0451  CKTOTAL 27*   BNP (last 3 results) No results for input(s): PROBNP in the last 8760  hours. HbA1C: Recent Labs    01/14/21 2209  HGBA1C 6.4*   CBG: Recent Labs  Lab 01/17/21 0357 01/17/21 0605 01/17/21 0636 01/17/21 0753 01/17/21 1136  GLUCAP 78 77 90 63* 266*   Lipid Profile: No results for input(s): CHOL, HDL, LDLCALC, TRIG, CHOLHDL, LDLDIRECT in the last 72 hours. Thyroid Function Tests: Recent Labs    01/15/21 0451  TSH 4.932*   Anemia Panel: Recent Labs    01/15/21 1429 01/15/21 1430  VITAMINB12 1,049*  --   FOLATE  --  9.7  FERRITIN 815*  --   TIBC 168*  --   IRON 105  --   RETICCTPCT 2.4  --    Sepsis Labs: Recent Labs  Lab 01/14/21 2153 01/15/21 0033 01/15/21 0451 01/15/21 0717  PROCALCITON  --   --  0.10  --   LATICACIDVEN 4.0* 3.4* 1.7 1.6    Recent Results (from the past 240 hour(s))  Urine Culture     Status: None   Collection Time: 01/14/21  9:52 PM   Specimen: Urine, Clean Catch  Result Value Ref Range Status   Specimen Description   Final    URINE, CLEAN CATCH Performed at [facility], [place] [address] [place], [place] [address]    Special Requests   Final    NONE Performed at [facility], [place] [address], [place], [place] [address]    Culture   Final    NO GROWTH Performed at [facility], [place] [address] [place], [place] [address]    Report Status 01/16/2021 FINAL  Final  Resp Panel by RT-PCR (Flu A&B, Covid) Urine, Bag (ped)     Status: None   Collection Time: 01/14/21  9:54 PM  Specimen: Urine, Bag (ped); Nasopharyngeal(NP) swabs in vial transport medium  Result Value Ref Range Status   SARS Coronavirus 2 by RT PCR NEGATIVE NEGATIVE Final    Comment: (NOTE) SARS-CoV-2 target nucleic acids are NOT DETECTED.  The SARS-CoV-2 RNA is generally detectable in upper respiratory specimens during the acute phase of infection. The lowest concentration of SARS-CoV-2 viral copies this assay can detect is 138 copies/mL. A negative result does not preclude  SARS-Cov-2 infection and should not be used as the sole basis for treatment or other patient management decisions. A negative result may occur with  improper specimen collection/handling, submission of specimen other than nasopharyngeal swab, presence of viral mutation(s) within the areas targeted by this assay, and inadequate number of viral copies(<138 copies/mL). A negative result must be combined with clinical observations, patient history, and epidemiological information. The expected result is Negative.  Fact Sheet for Patients:  EntrepreneurPulse.com.au  Fact Sheet for Healthcare Providers:  IncredibleEmployment.be  This test is no t yet approved or cleared by the [place] FDA and  has been authorized for detection and/or diagnosis of SARS-CoV-2 by FDA under an Emergency Use Authorization (EUA). This EUA will remain  in effect (meaning this test can be used) for the duration of the COVID-19 declaration under Section 564(b)(1) of the Act, 21 U.S.C.section 360bbb-3(b)(1), unless the authorization is terminated  or revoked sooner.       Influenza A by PCR NEGATIVE NEGATIVE Final   Influenza B by PCR NEGATIVE NEGATIVE Final    Comment: (NOTE) The Xpert Xpress SARS-CoV-2/FLU/RSV plus assay is intended as an aid in the diagnosis of influenza from Nasopharyngeal swab specimens and should not be used as a sole basis for treatment. Nasal washings and aspirates are unacceptable for Xpert Xpress SARS-CoV-2/FLU/RSV testing.  Fact Sheet for Patients: EntrepreneurPulse.com.au  Fact Sheet for Healthcare Providers: IncredibleEmployment.be  This test is not yet approved or cleared by the [place] FDA and has been authorized for detection and/or diagnosis of SARS-CoV-2 by FDA under an Emergency Use Authorization (EUA). This EUA will remain in effect (meaning this test can be used) for the duration of  the COVID-19 declaration under Section 564(b)(1) of the Act, 21 U.S.C. section 360bbb-3(b)(1), unless the authorization is terminated or revoked.  Performed at [facility], [place] [address] [place], [place] [address]   Culture, blood (Routine X 2) w Reflex to ID Panel     Status: None (Preliminary result)   Collection Time: 01/14/21 10:09 PM   Specimen: BLOOD RIGHT FOREARM  Result Value Ref Range Status   Specimen Description   Final    BLOOD RIGHT FOREARM Performed at [facility], [place] [address], [place], [place] [address]    Special Requests   Final    BOTTLES DRAWN AEROBIC AND ANAEROBIC Blood Culture results may not be optimal due to an inadequate volume of blood received in culture bottles Performed at [place] [address] [place], [place] [address]    Culture   Final    NO GROWTH 2 DAYS Performed at [place] [address], [place], [place] [address]    Report Status PENDING  Incomplete  Culture, blood (Routine X 2) w Reflex to ID Panel     Status: None (Preliminary result)   Collection Time: 01/14/21 10:09 PM   Specimen: BLOOD RIGHT FOREARM  Result Value Ref Range Status   Specimen Description   Final    BLOOD RIGHT FOREARM Performed  at [facility], [place] [address], [place], [place] [address]    Special Requests   Final    BOTTLES DRAWN AEROBIC AND ANAEROBIC Blood Culture results may not be optimal due to an excessive volume of blood received in culture bottles Performed at [place] [address], [place], [place] [address]    Culture   Final    NO GROWTH 2 DAYS Performed at [place] [address], [place], [place] [address]    Report Status PENDING  Incomplete  MRSA PCR Screening     Status: None   Collection Time: 01/15/21  2:35 AM   Specimen: Nasal Mucosa; Nasopharyngeal  Result Value Ref Range Status   MRSA by PCR NEGATIVE NEGATIVE Final    Comment:         The GeneXpert MRSA Assay (FDA approved for NASAL specimens only), is one component of a comprehensive MRSA colonization surveillance program. It is not intended to diagnose MRSA infection nor to guide or monitor treatment for MRSA infections. Performed at [facility], [place] [address], [place], [place] [address]       Radiology Studies: IR IVC FILTER PLMT / S&I Burke Keels GUID/MOD SED  Result Date: 01/16/2021 INDICATION: DVT with contraindication to anticoagulation. Please perform IVC filter placement for caval interruption purposes. Given patient's advanced age and multiple medical comorbidities, the IVC filter will be considered a permanent device and the patient will not be actively followed by the interventional radiology department for retrieval. EXAM: ULTRASOUND GUIDANCE FOR VASCULAR ACCESS IVC CATHETERIZATION AND VENOGRAM IVC FILTER INSERTION COMPARISON:  CT abdomen and pelvis-12/09/2017 MEDICATIONS: None. ANESTHESIA/SEDATION: None CONTRAST:  None; CO2 was utilized for this examination FLUOROSCOPY TIME:  1 minute, 30 seconds (83 mGy) COMPLICATIONS: None immediate PROCEDURE: Informed consent was obtained from the patient following explanation of the procedure, risks, benefits and alternatives. The patient understands, agrees and consents for the procedure. All questions were addressed. A time out was performed prior to the initiation of the procedure. Maximal barrier sterile technique utilized including caps, mask, sterile gowns, sterile gloves, large sterile drape, hand hygiene, and Betadine prep. Under sterile condition and local anesthesia, right internal jugular venous access was performed with ultrasound. An ultrasound image was saved and sent to PACS. Over a guidewire, the IVC filter delivery sheath and inner dilator were advanced into the IVC just above the IVC bifurcation. Contrast injection was performed for an IVC venogram. Through the delivery sheath, a retrievable  [place] IVC filter was deployed below the level of the renal veins and above the IVC bifurcation. Limited post deployment venacavagram was performed. The delivery sheath was removed and hemostasis was obtained with manual compression. A dressing was placed. The patient tolerated the procedure well without immediate post procedural complication. FINDINGS: The IVC is patent. No evidence of thrombus, stenosis, or occlusion. No variant venous anatomy. Successful placement of the IVC filter below the level of the renal veins. IMPRESSION: Successful ultrasound and fluoroscopically guided placement of an infrarenal retrievable IVC filter via right jugular approach. PLAN: Due to patient related comorbidities and/or clinical necessity, this IVC filter should be considered a permanent device. This patient will not be actively followed for future filter retrieval. Electronically Signed   By: Sandi Mariscal M.D.   On: 01/16/2021 13:43    Scheduled Meds: . aspirin  81 mg Oral q morning  . atorvastatin  40 mg Oral Daily  . Chlorhexidine Gluconate Cloth  6 each Topical Daily  .  docusate sodium  100 mg Oral BID  . dorzolamide-timolol  1 drop Both Eyes BID  . DULoxetine  60 mg Oral Daily  . famotidine  20 mg Oral Daily  . feeding supplement  237 mL Oral BID BM  . insulin aspart  0-9 Units Subcutaneous Q4H  . insulin glargine  15 Units Subcutaneous Daily  . latanoprost  1 drop Both Eyes QHS  . levothyroxine  50 mcg Oral Daily  . tamsulosin  0.4 mg Oral Daily   Continuous Infusions: . sodium chloride 75 mL/hr at 01/16/21 0457  . sodium chloride Stopped (01/14/21 2320)  . cefTRIAXone (ROCEPHIN)  IV 1 g (01/16/21 1600)     LOS: 2 days   Time spent: 28 minutes   Darliss Cheney, MD [facility]  01/17/2021, 11:56 AM   How to contact the [facility] Attending or Consulting provider [place] or covering provider during after hours [place], for this patient?  1. Check the care team in [facility] and look for a)  attending/consulting TR[facility] provider listed and b) the [facility] team listed. Page or secure chat [phone]. 2. Log into [email or web address] and use [place]'s universal password to access. If you do not have the password, please contact the hospital operator. 3. Locate the [facility] provider you are looking for under [facility] and page to a number that you can be directly reached. 4. If you still have difficulty reaching the provider, please page the [facility] (Director on Call) for the Hospitalists listed on amion for assistance.

## 2021-01-17 NOTE — Plan of Care (Signed)

## 2021-01-17 NOTE — Evaluation (Signed)
Physical Therapy Evaluation Patient Details Name: Hayley Fisher MRN: [id number] DOB: 1930-12-09 Today's Date: 01/17/2021   History of Present Illness  87 y.o. female brought from SNF with decreased p.o. intake noted to decreased LOC for about a week or so. admitted with sepsis d/t UTI and bil DVT; infrarenal IVC filter placed 5/28. PMH includes with medical history significant of IDDM, hypothyroidism, hypertension, anemia, constipation, dementia  Clinical Impression  Pt admitted with above diagnosis.  Pt cooperative, requires incr time and multi-modal cues to follow functional commands. Pt generally appears weaker and more deconditioned than last admission in April 2022. Agree with return to SNF unless pt is able to have 24hour care at home.  Pt currently with functional limitations due to the deficits listed below (see PT Problem List). Pt will benefit from skilled PT to increase their independence and safety with mobility to allow discharge to the venue listed below.       Follow Up Recommendations SNF    Equipment Recommendations  None recommended by PT    Recommendations for Other Services       Precautions / Restrictions Precautions Precautions: Fall Restrictions Weight Bearing Restrictions: No      Mobility  Bed Mobility Overal bed mobility: Needs Assistance Bed Mobility: Supine to Sit     Supine to sit: Mod assist;HOB elevated     General bed mobility comments: needing increased cues to initiate up to edge of bed, mod A for trunk support and initiate mobilizing LEs    Transfers Overall transfer level: Needs assistance Equipment used: Rolling walker (2 wheeled) Transfers: Sit to/from [facility] Sit to Stand: Min assist;+2 physical assistance;+2 safety/equipment Stand pivot transfers: Min assist;+2 physical assistance;+2 safety/equipment       General transfer comment: first attempt patient unable to extend hips/knees/trunk to upright position.  with second attempt and cues bring LEs underneath her patient able to power up to standing with min x2. patient needing assist to stabilize and sequence taking few steps to recliner, limited eccentric control into chair  Ambulation/Gait             General Gait Details: SPT only  Stairs            Wheelchair Mobility    Modified Rankin (Stroke Patients Only)       Balance Overall balance assessment: Needs assistance Sitting-balance support: Feet supported;No upper extremity supported Sitting balance-Leahy Scale: Fair     Standing balance support: Bilateral upper extremity supported Standing balance-Leahy Scale: Poor Standing balance comment: reliant on UE support                             Pertinent Vitals/Pain Pain Assessment: Faces Faces Pain Scale: Hurts a little bit Pain Location: generalized Pain Descriptors / Indicators: Moaning Pain Intervention(s): Monitored during session    Home Living Family/patient expects to be discharged to:: Unsure                 Additional Comments: per chart patient coming from [facility]    Prior Function Level of Independence:  (pt IND amb with device per PT notes from ~ 1 month ago)         Comments: patient is pleasantly confused unable to provide history. patient denies using an assistive device for ambulation. per chart from [facility] unsure if this is for rehab vs LTC resident     Hand Dominance   Dominant Hand: Right  Extremity/Trunk Assessment   Upper Extremity Assessment Upper Extremity Assessment: Defer to OT evaluation RUE Deficits / Details: note patient keeping 2nd-5th digits in flexed position, states her fingers have been like this a "long time" with AAROM lacking extension ~ 25 degrees at PIP joints    Lower Extremity Assessment Lower Extremity Assessment: Generalized weakness    Cervical / Trunk Assessment Cervical / Trunk Assessment: Kyphotic  Communication    Communication: No difficulties  Cognition Arousal/Alertness: Awake/alert Behavior During Therapy: WFL for tasks assessed/performed Overall Cognitive Status: History of cognitive impairments - at baseline                                 General Comments: patient needing increased processing time with directions, knows she is in [place]. tends to mimic      General Comments      Exercises     Assessment/Plan    PT Assessment Patient needs continued PT services  PT Problem List Decreased balance;Decreased activity tolerance;Decreased strength;Decreased mobility;Decreased safety awareness;Decreased cognition;Decreased knowledge of use of DME       PT Treatment Interventions DME instruction;Therapeutic exercise;Gait training;Functional mobility training;Therapeutic activities;Patient/family education;Balance training    PT Goals (Current goals can be found in the Care Plan section)  Acute Rehab PT Goals Patient Stated Goal: agreeable to get up to chair PT Goal Formulation: With patient Time For Goal Achievement: 01/31/21 Potential to Achieve Goals: Good    Frequency Min 2X/week   Barriers to discharge        Co-evaluation PT/OT/SLP Co-Evaluation/Treatment: Yes Reason for Co-Treatment: To address functional/ADL transfers PT goals addressed during session: Mobility/safety with mobility OT goals addressed during session: ADL's and self-care       AM-PAC PT "6 Clicks" Mobility  Outcome Measure Help needed turning from your back to your side while in a flat bed without using bedrails?: A Little Help needed moving from lying on your back to sitting on the side of a flat bed without using bedrails?: A Lot Help needed moving to and from a bed to a chair (including a wheelchair)?: A Lot Help needed standing up from a chair using your arms (e.g., wheelchair or bedside chair)?: A Lot Help needed to walk in hospital room?: A Lot Help needed climbing 3-5 steps with  a railing? : Total 6 Click Score: 12    End of Session   Activity Tolerance: Patient tolerated treatment well Patient left: with call bell/phone within reach;in chair;with chair alarm set   PT Visit Diagnosis: Other abnormalities of gait and mobility (R26.89);Muscle weakness (generalized) (M62.81)    Time: 7169-6789 PT Time Calculation (min) (ACUTE ONLY): 14 min   Charges:   PT Evaluation $PT Eval Low Complexity: [place], PT  Acute Rehab Dept ([place]) [phone] Pager [phone]  01/17/2021   [facility] 01/17/2021, 12:52 PM

## 2021-01-18 DIAGNOSIS — R531 Weakness: Secondary | ICD-10-CM | POA: Diagnosis not present

## 2021-01-18 DIAGNOSIS — Z7189 Other specified counseling: Secondary | ICD-10-CM | POA: Diagnosis not present

## 2021-01-18 DIAGNOSIS — G9340 Encephalopathy, unspecified: Secondary | ICD-10-CM | POA: Diagnosis not present

## 2021-01-18 DIAGNOSIS — Z515 Encounter for palliative care: Secondary | ICD-10-CM | POA: Diagnosis not present

## 2021-01-18 LAB — TYPE AND SCREEN
ABO/RH(D): O POS
Antibody Screen: NEGATIVE
Unit division: 0
Unit division: 0

## 2021-01-18 LAB — BPAM RBC
Blood Product Expiration Date: 202206282359
Blood Product Expiration Date: 202206282359
ISSUE DATE / TIME: 202205270045
ISSUE DATE / TIME: 202205270432
Unit Type and Rh: 5100
Unit Type and Rh: 5100

## 2021-01-18 LAB — GLUCOSE, CAPILLARY
Glucose-Capillary: 244 mg/dL — ABNORMAL HIGH (ref 70–99)
Glucose-Capillary: 160 mg/dL — ABNORMAL HIGH (ref 70–99)
Glucose-Capillary: 74 mg/dL (ref 70–99)
Glucose-Capillary: 132 mg/dL — ABNORMAL HIGH (ref 70–99)
Glucose-Capillary: 256 mg/dL — ABNORMAL HIGH (ref 70–99)
Glucose-Capillary: 328 mg/dL — ABNORMAL HIGH (ref 70–99)

## 2021-01-18 NOTE — Care Management Important Message (Signed)
Medicare IM given to the patient by Qusay Villada. 

## 2021-01-18 NOTE — Progress Notes (Signed)
Daily Progress Note   Patient Name: Hayley Fisher       Date: 01/18/2021 DOB: 1931-04-17  Age: 87 y.o. MRN#: [id number] Attending Physician: Darliss Cheney, MD Primary Care Physician: Danna Hefty, DO Admit Date: 01/14/2021  Reason for Consultation/Follow-up: Establishing goals of care  Subjective:  awake alert, has baseline dementia, she is oriented to person but not to place.  She states that she does not have any pain.  Oral intake is reasonable.  Status post IVC filter placement.  Length of Stay: 3  Current Medications: Scheduled Meds:  . amLODipine  5 mg Oral Daily  . aspirin  81 mg Oral q morning  . atorvastatin  40 mg Oral Daily  . Chlorhexidine Gluconate Cloth  6 each Topical Daily  . docusate sodium  100 mg Oral BID  . dorzolamide-timolol  1 drop Both Eyes BID  . DULoxetine  60 mg Oral Daily  . famotidine  20 mg Oral Daily  . feeding supplement  237 mL Oral BID BM  . insulin aspart  0-9 Units Subcutaneous Q4H  . insulin glargine  15 Units Subcutaneous Daily  . latanoprost  1 drop Both Eyes QHS  . levothyroxine  50 mcg Oral Daily  . tamsulosin  0.4 mg Oral Daily    Continuous Infusions: . sodium chloride Stopped (01/14/21 2320)    PRN Meds: acetaminophen **OR** acetaminophen, bisacodyl, HYDROcodone-acetaminophen, polyethylene glycol, witch hazel-glycerin  Physical Exam         Has baseline dementia Appears calm and comfortable Resting in bed Lungs are clear to auscultation S1-S2 Abdomen does not have any tenderness No edema Stage I pressure ulcer on the sacrum present on admission pictures noted in the chart  Vital Signs: BP (!) 156/84 (BP Location: Left Arm)   Pulse 96   Temp 98.4 F (36.9 C) (Oral)   Resp 18   Ht 5\' 5"  (1.651 m)   Wt 66.5 kg    SpO2 100%   BMI 24.40 kg/m  SpO2: SpO2: 100 % O2 Device: O2 Device: Room Air O2 Flow Rate: O2 Flow Rate (L/min): 4 L/min  Intake/output summary:   Intake/Output Summary (Last 24 hours) at 01/18/2021 1008 Last data filed at 01/18/2021 0900 Gross per 24 hour  Intake 1832.79 ml  Output 2050 ml  Net -217.21 ml   LBM:  Last BM Date: 01/15/21 Baseline Weight: Weight: 63 kg Most recent weight: Weight: 66.5 kg       Palliative Assessment/Data:     Palliative performance scale 40% Patient Active Problem List   Diagnosis Date Noted  . Sepsis ([place]) 01/15/2021  . Acute encephalopathy 12/09/2020  . Hyperbilirubinemia 12/09/2020  . Hypothyroidism 12/09/2020  . Memory changes 10/20/2020  . Acute on chronic anemia 09/21/2020  . Gastroesophageal reflux disease without esophagitis 09/16/2020  . Urinary frequency 09/11/2020  . Stage 1 decubitus ulcer 07/30/2020  . Uncontrolled type 2 diabetes mellitus with hyperglycemia ([place]) 07/02/2020  . Unstable gait 04/03/2018  . Frequent falls 04/03/2018  . Age-related physical debility 03/22/2018  . Skin ulcer of sacrum, limited to breakdown of skin ([place]) 03/16/2017  . Spinal stenosis of lumbar region 01/13/2016  . Chronic venous insufficiency 11/09/2015  . DDD (degenerative disc disease), lumbosacral 11/24/2014  . Constipation by delayed colonic transit 11/24/2014  . Frequent UTI 10/29/2012  . Type 2 diabetes mellitus with diabetic neuropathy ([place]) 10/19/2006  . Hyperlipidemia associated with type 2 diabetes mellitus ([address]) 10/19/2006  . Hypertension associated with diabetes ([place]) 10/19/2006  . Osteopenia 10/19/2006    Palliative Care Assessment & Plan   Patient Profile:   Assessment: 87 year old lady came from nursing facility, has been recently seen and evaluated by [facility] palliative at her facility.  Patient has past medical history significant for diabetes dyslipidemia glaucoma hypertension dementia.  More recently she has had  recurrent urinary tract infections.  Patient admitted with shortness of breath decreased oral intake during work-up found to have lower extremity venous Doppler study showing bilateral age-indeterminate deep vein thrombosis along with low hemoglobin of 6.6 g/dL.  Patient admitted to hospital medicine service given packed red blood cell transfusions, seen and evaluated by interventional radiology and to be considered for IVC filter placement.  Palliative service inpatient consultation requested for continuation of goals of care discussions.   Recommendations/Plan: Status post IVC filter placement Seen by PT and OT with recommendations for continuation of SNF Oral intake is a reasonable No further recommendations from palliative care, recommend continuation of palliative services at her SNF.  Goals of Care and Additional Recommendations: Limitations on Scope of Treatment: Full Scope Treatment  Code Status:    Code Status Orders  (From admission, onward)         Start     Ordered   01/15/21 0123  Full code  Continuous        01/15/21 0122        Code Status History    Date Active Date Inactive Code Status Order ID Comments User Context   12/09/2020 0617 12/15/2020 1812 Full Code [id number]  Vianne Bulls, MD ED   07/03/2020 0050 07/06/2020 2358 Full Code [id number]  Chotiner, Yevonne Aline, MD Inpatient   Advance Care Planning Activity      Prognosis:  Unable to determine  Discharge Planning: [place] for rehab with Palliative care service follow-up  Care plan was discussed with  Patient and her RN.   Thank you for allowing the Palliative Medicine Team to assist in the care of this patient.   Time In: 9 Time Out: 9.25 Total Time 25 Prolonged Time Billed No       Greater than 50%  of this time was spent counseling and coordinating care related to the above assessment and plan.  Loistine Chance, MD  Please contact Palliative Medicine Team phone at [phone] for  questions and concerns.

## 2021-01-18 NOTE — Progress Notes (Signed)
PROGRESS NOTE    Hayley Fisher  GMW:[id number] DOB: October 11, 1930 DOA: 01/14/2021 PCP: Danna Hefty, DO   Brief Narrative:  Hayley Fisher is a 87 y.o. female with medical history significant of IDDM, hypothyroidism, hypertension, anemia, constipation, dementia was brought from SNF with decreased p.o. intake noted to decreased LOC for about a week or so. Apparently patient had a Foley placed on May 24 and was being treated for UTI with Macrobid.however upon arrival to the ED, patient was alert and oriented.  Hemodynamically stable.  She has a history of frequent UTIs and she often gets confused.  She was subsequently diagnosed and admitted with severe sepsis secondary to UTI.  Started on Rocephin which she received for 3 days.  Cultures remain negative.  Encephalopathy resolved.  Anemia as below.  Recently admitted in April 2022 for acute delirium and metabolic encephalopathy thought to be secondary to UTI and hypoglycemia this improved and she was able to be discharged home to [facility] on 15 December 2020.  Assessment & Plan:   Active Problems:   Hyperlipidemia associated with type 2 diabetes mellitus ([place])   Skin ulcer of sacrum, limited to breakdown of skin ([place])   Uncontrolled type 2 diabetes mellitus with hyperglycemia ([facility])   Acute on chronic anemia   Acute encephalopathy   Hypothyroidism   Memory changes   Sepsis ([place])  Severe sepsis secondary to UTI, POA: Sepsis parameters resolved.  Culture negative.  Received Rocephin for 3 to 4 days.  Acute toxic encephalopathy secondary to UTI: Patient remains alert and partially oriented which is her baseline due to having history of dementia.  Hypothyroidism: Continue Synthroid.  Acute on chronic anemia: Initially her hemoglobin was 7.7 and repeat hemoglobin was 6.6.  She received 2 units of PRBC transfusion and posttransfusion hemoglobin is 8.9.  And now 9.8.  FOBT x1 negative this time and it was negative during recent hospitalization as  well indicating that she likely does not have GI bleed.  Work-up so far indicates anemia of chronic disease.  Essential hypertension: Blood pressure now better.  Started on amlodipine here.  Continue amlodipine 5 mg p.o. daily.  Bilateral lower extremity DVT: Status post IVC filter by IR on 01/16/2021.  Hyperlipidemia: Continue Lipitor.  Type 2 diabetes mellitus: Blood sugar labile, hypoglycemic this morning and now hyperglycemic.  Continue current regimen.  Mild to moderate protein calorie malnutrition: Prealbumin 6.5 only.  Dietitian consulted  Hypophosphatemia: Resolved.  Hypomagnesemia: Resolved  Stage I pressure ulcer at the sacrum: Management per wound care.  Pictures as below      Generalized weakness: PT OT recommends SNF.  TOC on board.  DVT prophylaxis: SCDs Start: 01/15/21 0123   Code Status: Full Code  Family Communication: None present at bedside.   Status is: Inpatient  Remains inpatient appropriate because:Inpatient level of care appropriate due to severity of illness   Dispo: The patient is from: SNF              Anticipated d/c is to: SNF              Patient currently is medically stable to d/c.   Difficult to place patient No        Estimated body mass index is 24.4 kg/m as calculated from the following:   Height as of this encounter: 5\' 5"  (1.651 m).   Weight as of this encounter: 66.5 kg.  Pressure Injury 01/15/21 Sacrum Stage 2 -  Partial thickness loss of dermis presenting as a shallow  open injury with a red, pink wound bed without slough. (Active)  01/15/21 2050  Location: Sacrum  Location Orientation:   Staging: Stage 2 -  Partial thickness loss of dermis presenting as a shallow open injury with a red, pink wound bed without slough.  Wound Description (Comments):   Present on Admission: Yes     Nutritional status:  Nutrition Problem: Increased nutrient needs Etiology: acute illness   Signs/Symptoms: estimated  needs   Interventions: Ensure Enlive (each supplement provides 350kcal and 20 grams of protein)    Consultants:   IR and palliative care  Procedures:   None  Antimicrobials:  Anti-infectives (From admission, onward)   Start     Dose/Rate Route Frequency Ordered Stop   01/16/21 2200  vancomycin (VANCOCIN) IVPB 1000 mg/200 mL premix  Status:  Discontinued        1,000 mg 200 mL/hr over 60 Minutes Intravenous Every 36 hours 01/15/21 0101 01/15/21 1418   01/15/21 1800  cefTRIAXone (ROCEPHIN) 1 g in sodium chloride 0.9 % 100 mL IVPB  Status:  Discontinued        1 g 200 mL/hr over 30 Minutes Intravenous Every 24 hours 01/15/21 1418 01/18/21 0821   01/15/21 1200  ceFEPIme (MAXIPIME) 2 g in sodium chloride 0.9 % 100 mL IVPB  Status:  Discontinued        2 g 200 mL/hr over 30 Minutes Intravenous Every 12 hours 01/15/21 0101 01/15/21 1418   01/15/21 0800  metroNIDAZOLE (FLAGYL) IVPB 500 mg  Status:  Discontinued        500 mg 100 mL/hr over 60 Minutes Intravenous Every 8 hours 01/15/21 0051 01/15/21 1418   01/14/21 2345  ceFEPIme (MAXIPIME) 2 g in sodium chloride 0.9 % 100 mL IVPB        2 g 200 mL/hr over 30 Minutes Intravenous  Once 01/14/21 2334 01/15/21 0035   01/14/21 2345  metroNIDAZOLE (FLAGYL) IVPB 500 mg        500 mg 100 mL/hr over 60 Minutes Intravenous  Once 01/14/21 2334 01/15/21 0053   01/14/21 2345  vancomycin (VANCOCIN) IVPB 1000 mg/200 mL premix  Status:  Discontinued        1,000 mg 200 mL/hr over 60 Minutes Intravenous  Once 01/14/21 2334 01/14/21 2341   01/14/21 2345  vancomycin (VANCOREADY) IVPB 1500 mg/300 mL        1,500 mg 150 mL/hr over 120 Minutes Intravenous  Once 01/14/21 2341 01/15/21 0237   01/14/21 2330  cefTRIAXone (ROCEPHIN) 1 g in sodium chloride 0.9 % 100 mL IVPB  Status:  Discontinued        1 g 200 mL/hr over 30 Minutes Intravenous  Once 01/14/21 2322 01/14/21 2337         Subjective: Seen and examined.  No complaints except back pain and  the pressure ulcer.  Alert and partially oriented at her baseline.  Objective: Vitals:   01/17/21 0931 01/17/21 1321 01/17/21 2025 01/18/21 0402  BP:  135/79 135/78 (!) 156/84  Pulse:  (!) 106 (!) 102 96  Resp:  18 18 18   Temp:  98.5 F (36.9 C) 99 F (37.2 C) 98.4 F (36.9 C)  TempSrc:  Oral Oral Oral  SpO2:  97% 100% 100%  Weight:      Height: 5\' 5"  (1.651 m)       Intake/Output Summary (Last 24 hours) at 01/18/2021 1336 Last data filed at 01/18/2021 1022 Gross per 24 hour  Intake 1832.79 ml  Output 2850  ml  Net -1017.21 ml   Filed Weights   01/15/21 0037 01/15/21 1731  Weight: 63 kg 66.5 kg    Examination:  General exam: Appears calm and comfortable  Respiratory system: Clear to auscultation. Respiratory effort normal. Cardiovascular system: S1 & S2 heard, RRR. No JVD, murmurs, rubs, gallops or clicks. No pedal edema. Gastrointestinal system: Abdomen is nondistended, soft and nontender. No organomegaly or masses felt. Normal bowel sounds heard. Central nervous system: Alert and oriented x2. No focal neurological deficits. Extremities: Symmetric 5 x 5 power. Skin: No rashes, lesions or ulcers.  Psychiatry: Judgement and insight appear poor   Data Reviewed: I have personally reviewed following labs and imaging studies  CBC: Recent Labs  Lab 01/14/21 2209 01/15/21 0451 01/15/21 0944 01/16/21 0207 01/17/21 0344  WBC 6.8 5.7 5.9 5.8 5.1  NEUTROABS 5.5 4.7  --   --   --   HGB 7.4* 6.6* 8.9* 9.8* 9.8*  HCT 24.0* 21.1* 27.6* 30.9* 30.7*  MCV 98.4 97.7 95.8 95.7 96.2  PLT 362 320 260 313 093   Basic Metabolic Panel: Recent Labs  Lab 01/14/21 2209 01/15/21 0451 01/16/21 0207 01/17/21 0344  NA 133* 135 136 139  K 4.6 3.9 4.9 4.2  CL 100 105 103 107  CO2 22 25 28 28   GLUCOSE 92 70 220* 87  BUN 38* 29* 21 15  CREATININE 1.00 0.61 0.68 0.57  CALCIUM 8.5* 7.5* 8.2* 8.1*  MG  --  1.2*  1.1* 2.0  --   PHOS  --  2.4*  2.4* 2.7  --    GFR: Estimated  Creatinine Clearance: 44.6 mL/min (by C-G formula based on SCr of 0.57 mg/dL). Liver Function Tests: Recent Labs  Lab 01/14/21 2209 01/15/21 0451  AST 16 13*  ALT 9 8  ALKPHOS 87 67  BILITOT 1.2 1.1  PROT 6.0* 4.9*  ALBUMIN 2.4* 1.9*   Recent Labs  Lab 01/14/21 2209  LIPASE 21   No results for input(s): AMMONIA in the last 168 hours. Coagulation Profile: Recent Labs  Lab 01/15/21 0030  INR 1.1   Cardiac Enzymes: Recent Labs  Lab 01/15/21 0451  CKTOTAL 27*   BNP (last 3 results) No results for input(s): PROBNP in the last 8760 hours. HbA1C: No results for input(s): HGBA1C in the last 72 hours. CBG: Recent Labs  Lab 01/17/21 2021 01/18/21 0109 01/18/21 0357 01/18/21 0755 01/18/21 1218  GLUCAP 349* 244* 160* 74 132*   Lipid Profile: No results for input(s): CHOL, HDL, LDLCALC, TRIG, CHOLHDL, LDLDIRECT in the last 72 hours. Thyroid Function Tests: No results for input(s): TSH, T4TOTAL, FREET4, T3FREE, THYROIDAB in the last 72 hours. Anemia Panel: Recent Labs    01/15/21 1429 01/15/21 1430  VITAMINB12 1,049*  --   FOLATE  --  9.7  FERRITIN 815*  --   TIBC 168*  --   IRON 105  --   RETICCTPCT 2.4  --    Sepsis Labs: Recent Labs  Lab 01/14/21 2153 01/15/21 0033 01/15/21 0451 01/15/21 0717  PROCALCITON  --   --  0.10  --   LATICACIDVEN 4.0* 3.4* 1.7 1.6    Recent Results (from the past 240 hour(s))  Urine Culture     Status: None   Collection Time: 01/14/21  9:52 PM   Specimen: Urine, Clean Catch  Result Value Ref Range Status   Specimen Description   Final    URINE, CLEAN CATCH Performed at [facility], [place] [address] [place],  [place] [address]    Special Requests   Final    NONE Performed at [facility], [place] [address], [place], [place] [address]    Culture   Final    NO GROWTH Performed at [facility], [place] [address] [place], [place] [address]    Report Status 01/16/2021 FINAL  Final   Resp Panel by RT-PCR (Flu A&B, Covid) Urine, Bag (ped)     Status: None   Collection Time: 01/14/21  9:54 PM   Specimen: Urine, Bag (ped); Nasopharyngeal(NP) swabs in vial transport medium  Result Value Ref Range Status   SARS Coronavirus 2 by RT PCR NEGATIVE NEGATIVE Final    Comment: (NOTE) SARS-CoV-2 target nucleic acids are NOT DETECTED.  The SARS-CoV-2 RNA is generally detectable in upper respiratory specimens during the acute phase of infection. The lowest concentration of SARS-CoV-2 viral copies this assay can detect is 138 copies/mL. A negative result does not preclude SARS-Cov-2 infection and should not be used as the sole basis for treatment or other patient management decisions. A negative result may occur with  improper specimen collection/handling, submission of specimen other than nasopharyngeal swab, presence of viral mutation(s) within the areas targeted by this assay, and inadequate number of viral copies(<138 copies/mL). A negative result must be combined with clinical observations, patient history, and epidemiological information. The expected result is Negative.  Fact Sheet for Patients:  EntrepreneurPulse.com.au  Fact Sheet for Healthcare Providers:  IncredibleEmployment.be  This test is no t yet approved or cleared by the [place] FDA and  has been authorized for detection and/or diagnosis of SARS-CoV-2 by FDA under an Emergency Use Authorization (EUA). This EUA will remain  in effect (meaning this test can be used) for the duration of the COVID-19 declaration under Section 564(b)(1) of the Act, 21 U.S.C.section 360bbb-3(b)(1), unless the authorization is terminated  or revoked sooner.       Influenza A by PCR NEGATIVE NEGATIVE Final   Influenza B by PCR NEGATIVE NEGATIVE Final    Comment: (NOTE) The Xpert Xpress SARS-CoV-2/FLU/RSV plus assay is intended as an aid in the diagnosis of influenza from Nasopharyngeal  swab specimens and should not be used as a sole basis for treatment. Nasal washings and aspirates are unacceptable for Xpert Xpress SARS-CoV-2/FLU/RSV testing.  Fact Sheet for Patients: EntrepreneurPulse.com.au  Fact Sheet for Healthcare Providers: IncredibleEmployment.be  This test is not yet approved or cleared by the [place] FDA and has been authorized for detection and/or diagnosis of SARS-CoV-2 by FDA under an Emergency Use Authorization (EUA). This EUA will remain in effect (meaning this test can be used) for the duration of the COVID-19 declaration under Section 564(b)(1) of the Act, 21 U.S.C. section 360bbb-3(b)(1), unless the authorization is terminated or revoked.  Performed at [facility], [place] [address] [place], [place] [address]   Culture, blood (Routine X 2) w Reflex to ID Panel     Status: None (Preliminary result)   Collection Time: 01/14/21 10:09 PM   Specimen: BLOOD RIGHT FOREARM  Result Value Ref Range Status   Specimen Description   Final    BLOOD RIGHT FOREARM Performed at [facility], [place] [address], [place], [place] [address]    Special Requests   Final    BOTTLES DRAWN AEROBIC AND ANAEROBIC Blood Culture results may not be optimal due to an inadequate volume of blood received in culture bottles Performed at [place] [address] [place], [place]  [address]    Culture   Final    NO GROWTH 3 DAYS Performed at [facility], [place] [address], [place], [place] [address]    Report Status PENDING  Incomplete  Culture, blood (Routine X 2) w Reflex to ID Panel     Status: None (Preliminary result)   Collection Time: 01/14/21 10:09 PM   Specimen: BLOOD RIGHT FOREARM  Result Value Ref Range Status   Specimen Description   Final    BLOOD RIGHT FOREARM Performed at [facility], [place] [address], [place], [place] [address]    Special Requests   Final     BOTTLES DRAWN AEROBIC AND ANAEROBIC Blood Culture results may not be optimal due to an excessive volume of blood received in culture bottles Performed at [place] [address] [place], [place] [address]    Culture   Final    NO GROWTH 3 DAYS Performed at [facility], [place] [address] [place], [place] [address]    Report Status PENDING  Incomplete  MRSA PCR Screening     Status: None   Collection Time: 01/15/21  2:35 AM   Specimen: Nasal Mucosa; Nasopharyngeal  Result Value Ref Range Status   MRSA by PCR NEGATIVE NEGATIVE Final    Comment:        The GeneXpert MRSA Assay (FDA approved for NASAL specimens only), is one component of a comprehensive MRSA colonization surveillance program. It is not intended to diagnose MRSA infection nor to guide or monitor treatment for MRSA infections. Performed at [facility], [place] [address] [place],  [address]       Radiology Studies: No results found.  Scheduled Meds: . amLODipine  5 mg Oral Daily  . aspirin  81 mg Oral q morning  . atorvastatin  40 mg Oral Daily  . Chlorhexidine Gluconate Cloth  6 each Topical Daily  . docusate sodium  100 mg Oral BID  . dorzolamide-timolol  1 drop Both Eyes BID  . DULoxetine  60 mg Oral Daily  . famotidine  20 mg Oral Daily  . feeding supplement  237 mL Oral BID BM  . insulin aspart  0-9 Units Subcutaneous Q4H  . insulin glargine  15 Units Subcutaneous Daily  . latanoprost  1 drop Both Eyes QHS  . levothyroxine  50 mcg Oral Daily  . tamsulosin  0.4 mg Oral Daily   Continuous Infusions: . sodium chloride Stopped (01/14/21 2320)     LOS: 3 days   Time spent: 27 minutes   Darliss Cheney, MD [facility]  01/18/2021, 1:36 PM   How to contact the [facility] Attending or Consulting provider [place] or covering provider during after hours [place], for this patient?  1. Check the care team in [facility] and look for a) attending/consulting TRH  provider listed and b) the [facility] team listed. Page or secure chat [phone]. 2. Log into [email or web address] and use [place]'s universal password to access. If you do not have the password, please contact the hospital operator. 3. Locate the [facility] provider you are looking for under [facility] and page to a number that you can be directly reached. 4. If you still have difficulty reaching the provider, please page the [facility] (Director on Call) for the Hospitalists listed on amion for assistance.

## 2021-01-19 DIAGNOSIS — G9340 Encephalopathy, unspecified: Secondary | ICD-10-CM | POA: Diagnosis not present

## 2021-01-19 LAB — BASIC METABOLIC PANEL
Anion gap: 5 (ref 5–15)
BUN: 22 mg/dL (ref 8–23)
CO2: 29 mmol/L (ref 22–32)
Calcium: 7.9 mg/dL — ABNORMAL LOW (ref 8.9–10.3)
Chloride: 105 mmol/L (ref 98–111)
Creatinine, Ser: 0.56 mg/dL (ref 0.44–1.00)
GFR, Estimated: >60 mL/min (ref >60)
Glucose, Bld: 149 mg/dL — ABNORMAL HIGH (ref 70–99)
Potassium: 3.9 mmol/L (ref 3.5–5.1)
Sodium: 139 mmol/L (ref 135–145)

## 2021-01-19 LAB — GLUCOSE, CAPILLARY
Glucose-Capillary: 133 mg/dL — ABNORMAL HIGH (ref 70–99)
Glucose-Capillary: 139 mg/dL — ABNORMAL HIGH (ref 70–99)
Glucose-Capillary: 190 mg/dL — ABNORMAL HIGH (ref 70–99)
Glucose-Capillary: 272 mg/dL — ABNORMAL HIGH (ref 70–99)
Glucose-Capillary: 309 mg/dL — ABNORMAL HIGH (ref 70–99)
Glucose-Capillary: 338 mg/dL — ABNORMAL HIGH (ref 70–99)

## 2021-01-19 LAB — CBC WITH DIFFERENTIAL/PLATELET
Abs Immature Granulocytes: 0.2 10*3/uL — ABNORMAL HIGH (ref 0.00–0.07)
Basophils Absolute: 0 10*3/uL (ref 0.0–0.1)
Basophils Relative: 0 %
Eosinophils Absolute: 0.1 10*3/uL (ref 0.0–0.5)
Eosinophils Relative: 2 %
HCT: 30.2 % — ABNORMAL LOW (ref 36.0–46.0)
Hemoglobin: 9.5 g/dL — ABNORMAL LOW (ref 12.0–15.0)
Immature Granulocytes: 4 %
Lymphocytes Relative: 14 %
Lymphs Abs: 0.7 10*3/uL (ref 0.7–4.0)
MCH: 30.8 pg (ref 26.0–34.0)
MCHC: 31.5 g/dL (ref 30.0–36.0)
MCV: 98.1 fL (ref 80.0–100.0)
Monocytes Absolute: 0.5 10*3/uL (ref 0.1–1.0)
Monocytes Relative: 9 %
Neutro Abs: 3.8 10*3/uL (ref 1.7–7.7)
Neutrophils Relative %: 71 %
Platelets: 263 10*3/uL (ref 150–400)
RBC: 3.08 MIL/uL — ABNORMAL LOW (ref 3.87–5.11)
RDW: 15.9 % — ABNORMAL HIGH (ref 11.5–15.5)
WBC: 5.2 10*3/uL (ref 4.0–10.5)
nRBC: 0 % (ref 0.0–0.2)

## 2021-01-19 NOTE — Progress Notes (Signed)
PROGRESS NOTE    Hayley Fisher  JYN:[id number] DOB: Mar 25, 1931 DOA: 01/14/2021 PCP: Danna Hefty, DO   Brief Narrative:  Hayley Fisher is a 87 y.o. female with medical history significant of IDDM, hypothyroidism, hypertension, anemia, constipation, dementia was brought from SNF with decreased p.o. intake noted to decreased LOC for about a week or so. Apparently patient had a Foley placed on May 24 and was being treated for UTI with Macrobid.however upon arrival to the ED, patient was alert and oriented.  Hemodynamically stable.  She has a history of frequent UTIs and she often gets confused.  She was subsequently diagnosed and admitted with severe sepsis secondary to UTI.  Started on Rocephin which she received for 3 days.  Cultures remain negative.  Encephalopathy resolved.  Anemia as below.  Recently admitted in April 2022 for acute delirium and metabolic encephalopathy thought to be secondary to UTI and hypoglycemia this improved and she was able to be discharged home to [facility] on 15 December 2020.  Assessment & Plan:   Active Problems:   Hyperlipidemia associated with type 2 diabetes mellitus ([place])   Skin ulcer of sacrum, limited to breakdown of skin ([place])   Uncontrolled type 2 diabetes mellitus with hyperglycemia (HCC)   Acute on chronic anemia   Acute encephalopathy   Hypothyroidism   Memory changes   Sepsis ([place])  Severe sepsis secondary to UTI, POA: Sepsis parameters resolved.  Culture negative.  Received Rocephin for 3 to 4 days.  Acute toxic encephalopathy secondary to UTI: Patient remains alert and partially oriented which is her baseline due to having history of dementia.  Hypothyroidism: Continue Synthroid.  Acute on chronic anemia: Initially her hemoglobin was 7.7 and repeat hemoglobin was 6.6.  She received 2 units of PRBC transfusion and posttransfusion hemoglobin is 8.9.  And has remained over 9 ever since.  FOBT x1 negative this time and it was negative during  recent hospitalization as well indicating that she likely does not have GI bleed.  Work-up so far indicates anemia of chronic disease.  Essential hypertension: Blood pressure now better.  Started on amlodipine here.  Continue amlodipine 5 mg p.o. daily.  Bilateral lower extremity DVT: Status post IVC filter by IR on 01/16/2021.  Hyperlipidemia: Continue Lipitor.  Type 2 diabetes mellitus: Blood sugar remains very labile, hypoglycemic yesterday early morning, normal blood sugar this morning and now hyperglycemic.  Continue current Lantus 15 units and SSI.  Mild to moderate protein calorie malnutrition: Prealbumin 6.5 only.  Dietitian consulted  Hypophosphatemia: Resolved.  Hypomagnesemia: Resolved  Stage I pressure ulcer at the sacrum: Management per wound care.  Pictures as below      Generalized weakness: PT OT recommends SNF.  TOC on board.  DVT prophylaxis: SCDs Start: 01/15/21 0123   Code Status: Full Code  Family Communication: None present at bedside. Discussed with family yesterday.  Status is: Inpatient  Remains inpatient appropriate because:Inpatient level of care appropriate due to severity of illness   Dispo: The patient is from: SNF              Anticipated d/c is to: SNF              Patient currently is medically stable to d/c.   Difficult to place patient No        Estimated body mass index is 24.4 kg/m as calculated from the following:   Height as of this encounter: 5\' 5"  (1.651 m).   Weight as of this encounter: 66.5  kg.  Pressure Injury 01/15/21 Sacrum Stage 2 -  Partial thickness loss of dermis presenting as a shallow open injury with a red, pink wound bed without slough. (Active)  01/15/21 2050  Location: Sacrum  Location Orientation:   Staging: Stage 2 -  Partial thickness loss of dermis presenting as a shallow open injury with a red, pink wound bed without slough.  Wound Description (Comments):   Present on Admission: Yes     Nutritional  status:  Nutrition Problem: Increased nutrient needs Etiology: acute illness   Signs/Symptoms: estimated needs   Interventions: Ensure Enlive (each supplement provides 350kcal and 20 grams of protein)    Consultants:   IR and palliative care  Procedures:   None  Antimicrobials:  Anti-infectives (From admission, onward)   Start     Dose/Rate Route Frequency Ordered Stop   01/16/21 2200  vancomycin (VANCOCIN) IVPB 1000 mg/200 mL premix  Status:  Discontinued        1,000 mg 200 mL/hr over 60 Minutes Intravenous Every 36 hours 01/15/21 0101 01/15/21 1418   01/15/21 1800  cefTRIAXone (ROCEPHIN) 1 g in sodium chloride 0.9 % 100 mL IVPB  Status:  Discontinued        1 g 200 mL/hr over 30 Minutes Intravenous Every 24 hours 01/15/21 1418 01/18/21 0821   01/15/21 1200  ceFEPIme (MAXIPIME) 2 g in sodium chloride 0.9 % 100 mL IVPB  Status:  Discontinued        2 g 200 mL/hr over 30 Minutes Intravenous Every 12 hours 01/15/21 0101 01/15/21 1418   01/15/21 0800  metroNIDAZOLE (FLAGYL) IVPB 500 mg  Status:  Discontinued        500 mg 100 mL/hr over 60 Minutes Intravenous Every 8 hours 01/15/21 0051 01/15/21 1418   01/14/21 2345  ceFEPIme (MAXIPIME) 2 g in sodium chloride 0.9 % 100 mL IVPB        2 g 200 mL/hr over 30 Minutes Intravenous  Once 01/14/21 2334 01/15/21 0035   01/14/21 2345  metroNIDAZOLE (FLAGYL) IVPB 500 mg        500 mg 100 mL/hr over 60 Minutes Intravenous  Once 01/14/21 2334 01/15/21 0053   01/14/21 2345  vancomycin (VANCOCIN) IVPB 1000 mg/200 mL premix  Status:  Discontinued        1,000 mg 200 mL/hr over 60 Minutes Intravenous  Once 01/14/21 2334 01/14/21 2341   01/14/21 2345  vancomycin (VANCOREADY) IVPB 1500 mg/300 mL        1,500 mg 150 mL/hr over 120 Minutes Intravenous  Once 01/14/21 2341 01/15/21 0237   01/14/21 2330  cefTRIAXone (ROCEPHIN) 1 g in sodium chloride 0.9 % 100 mL IVPB  Status:  Discontinued        1 g 200 mL/hr over 30 Minutes Intravenous   Once 01/14/21 2322 01/14/21 2337         Subjective: Seen and examined.  Patient is alert as usual but she is more oriented today.  She was able to answer all the questions today.  She has no complaint.  Objective: Vitals:   01/18/21 0402 01/18/21 1451 01/18/21 2305 01/19/21 0425  BP: (!) 156/84 132/68 130/80 (!) 143/88  Pulse: 96 (!) 101 (!) 106 93  Resp: 18 18  20   Temp: 98.4 F (36.9 C) 98.9 F (37.2 C) (!) 97.4 F (36.3 C) 98.5 F (36.9 C)  TempSrc: Oral Oral Oral   SpO2: 100% 99% 100% 100%  Weight:      Height:  Intake/Output Summary (Last 24 hours) at 01/19/2021 1322 Last data filed at 01/19/2021 0500 Gross per 24 hour  Intake --  Output 1400 ml  Net -1400 ml   Filed Weights   01/15/21 0037 01/15/21 1731  Weight: 63 kg 66.5 kg    Examination: General exam: Appears calm and comfortable  Respiratory system: Clear to auscultation. Respiratory effort normal. Cardiovascular system: S1 & S2 heard, RRR. No JVD, murmurs, rubs, gallops or clicks. No pedal edema. Gastrointestinal system: Abdomen is nondistended, soft and nontender. No organomegaly or masses felt. Normal bowel sounds heard. Central nervous system: Alert and oriented. No focal neurological deficits. Extremities: Symmetric 5 x 5 power. Skin: No rashes, lesions or ulcers.  Psychiatry: Judgement and insight appear poor.  Data Reviewed: I have personally reviewed following labs and imaging studies  CBC: Recent Labs  Lab 01/14/21 2209 01/15/21 0451 01/15/21 0944 01/16/21 0207 01/17/21 0344 01/19/21 0343  WBC 6.8 5.7 5.9 5.8 5.1 5.2  NEUTROABS 5.5 4.7  --   --   --  3.8  HGB 7.4* 6.6* 8.9* 9.8* 9.8* 9.5*  HCT 24.0* 21.1* 27.6* 30.9* 30.7* 30.2*  MCV 98.4 97.7 95.8 95.7 96.2 98.1  PLT 362 320 260 313 316 100   Basic Metabolic Panel: Recent Labs  Lab 01/14/21 2209 01/15/21 0451 01/16/21 0207 01/17/21 0344 01/19/21 0343  NA 133* 135 136 139 139  K 4.6 3.9 4.9 4.2 3.9  CL 100 105 103  107 105  CO2 22 25 28 28 29   GLUCOSE 92 70 220* 87 149*  BUN 38* 29* 21 15 22   CREATININE 1.00 0.61 0.68 0.57 0.56  CALCIUM 8.5* 7.5* 8.2* 8.1* 7.9*  MG  --  1.2*  1.1* 2.0  --   --   PHOS  --  2.4*  2.4* 2.7  --   --    GFR: Estimated Creatinine Clearance: 44.6 mL/min (by C-G formula based on SCr of 0.56 mg/dL). Liver Function Tests: Recent Labs  Lab 01/14/21 2209 01/15/21 0451  AST 16 13*  ALT 9 8  ALKPHOS 87 67  BILITOT 1.2 1.1  PROT 6.0* 4.9*  ALBUMIN 2.4* 1.9*   Recent Labs  Lab 01/14/21 2209  LIPASE 21   No results for input(s): AMMONIA in the last 168 hours. Coagulation Profile: Recent Labs  Lab 01/15/21 0030  INR 1.1   Cardiac Enzymes: Recent Labs  Lab 01/15/21 0451  CKTOTAL 27*   BNP (last 3 results) No results for input(s): PROBNP in the last 8760 hours. HbA1C: No results for input(s): HGBA1C in the last 72 hours. CBG: Recent Labs  Lab 01/18/21 2001 01/19/21 0050 01/19/21 0422 01/19/21 0758 01/19/21 1231  GLUCAP 328* 190* 133* 139* 309*   Lipid Profile: No results for input(s): CHOL, HDL, LDLCALC, TRIG, CHOLHDL, LDLDIRECT in the last 72 hours. Thyroid Function Tests: No results for input(s): TSH, T4TOTAL, FREET4, T3FREE, THYROIDAB in the last 72 hours. Anemia Panel: No results for input(s): VITAMINB12, FOLATE, FERRITIN, TIBC, IRON, RETICCTPCT in the last 72 hours. Sepsis Labs: Recent Labs  Lab 01/14/21 2153 01/15/21 0033 01/15/21 0451 01/15/21 0717  PROCALCITON  --   --  0.10  --   LATICACIDVEN 4.0* 3.4* 1.7 1.6    Recent Results (from the past 240 hour(s))  Urine Culture     Status: None   Collection Time: 01/14/21  9:52 PM   Specimen: Urine, Clean Catch  Result Value Ref Range Status   Specimen Description   Final    URINE,  CLEAN CATCH Performed at [facility], [place] [address] [place], [place] [address]    Special Requests   Final    NONE Performed at [facility], [place] [address], [place], [place] [address]    Culture   Final    NO GROWTH Performed at [facility], [place] [address], [place], [place] [address]    Report Status 01/16/2021 FINAL  Final  Resp Panel by RT-PCR (Flu A&B, Covid) Urine, Bag (ped)     Status: None   Collection Time: 01/14/21  9:54 PM   Specimen: Urine, Bag (ped); Nasopharyngeal(NP) swabs in vial transport medium  Result Value Ref Range Status   SARS Coronavirus 2 by RT PCR NEGATIVE NEGATIVE Final    Comment: (NOTE) SARS-CoV-2 target nucleic acids are NOT DETECTED.  The SARS-CoV-2 RNA is generally detectable in upper respiratory specimens during the acute phase of infection. The lowest concentration of SARS-CoV-2 viral copies this assay can detect is 138 copies/mL. A negative result does not preclude SARS-Cov-2 infection and should not be used as the sole basis for treatment or other patient management decisions. A negative result may occur with  improper specimen collection/handling, submission of specimen other than nasopharyngeal swab, presence of viral mutation(s) within the areas targeted by this assay, and inadequate number of viral copies(<138 copies/mL). A negative result must be combined with clinical observations, patient history, and epidemiological information. The expected result is Negative.  Fact Sheet for Patients:  EntrepreneurPulse.com.au  Fact Sheet for Healthcare Providers:  IncredibleEmployment.be  This test is no t yet approved or cleared by the [place] FDA and  has been authorized for detection and/or diagnosis of SARS-CoV-2 by FDA under an Emergency Use Authorization (EUA). This EUA will remain  in effect (meaning this test can be used) for the duration of the COVID-19 declaration under Section 564(b)(1) of the Act, 21 U.S.C.section 360bbb-3(b)(1), unless the authorization is terminated  or revoked sooner.       Influenza A by PCR NEGATIVE NEGATIVE Final    Influenza B by PCR NEGATIVE NEGATIVE Final    Comment: (NOTE) The Xpert Xpress SARS-CoV-2/FLU/RSV plus assay is intended as an aid in the diagnosis of influenza from Nasopharyngeal swab specimens and should not be used as a sole basis for treatment. Nasal washings and aspirates are unacceptable for Xpert Xpress SARS-CoV-2/FLU/RSV testing.  Fact Sheet for Patients: EntrepreneurPulse.com.au  Fact Sheet for Healthcare Providers: IncredibleEmployment.be  This test is not yet approved or cleared by the [place] FDA and has been authorized for detection and/or diagnosis of SARS-CoV-2 by FDA under an Emergency Use Authorization (EUA). This EUA will remain in effect (meaning this test can be used) for the duration of the COVID-19 declaration under Section 564(b)(1) of the Act, 21 U.S.C. section 360bbb-3(b)(1), unless the authorization is terminated or revoked.  Performed at [facility], [place] [address], [place], [place] [address]   Culture, blood (Routine X 2) w Reflex to ID Panel     Status: None (Preliminary result)   Collection Time: 01/14/21 10:09 PM   Specimen: BLOOD RIGHT FOREARM  Result Value Ref Range Status   Specimen Description   Final    BLOOD RIGHT FOREARM Performed at [facility], [place] [address], [place], [place] [address]    Special Requests   Final    BOTTLES DRAWN AEROBIC AND ANAEROBIC Blood Culture results may not be optimal due to an inadequate volume of blood received in culture  bottles Performed at [place] [address], [place], [place] [address]    Culture   Final    NO GROWTH 4 DAYS Performed at [facility], [place] [address] [place], [place] [address]    Report Status PENDING  Incomplete  Culture, blood (Routine X 2) w Reflex to ID Panel     Status: None (Preliminary result)   Collection Time: 01/14/21 10:09 PM   Specimen: BLOOD RIGHT FOREARM  Result Value Ref  Range Status   Specimen Description   Final    BLOOD RIGHT FOREARM Performed at [facility], [place] [address], [place], [place] [address]    Special Requests   Final    BOTTLES DRAWN AEROBIC AND ANAEROBIC Blood Culture results may not be optimal due to an excessive volume of blood received in culture bottles Performed at [place] [address] [place], [place] [address]    Culture   Final    NO GROWTH 4 DAYS Performed at [facility], [place] [address] [place], [place] [address]    Report Status PENDING  Incomplete  MRSA PCR Screening     Status: None   Collection Time: 01/15/21  2:35 AM   Specimen: Nasal Mucosa; Nasopharyngeal  Result Value Ref Range Status   MRSA by PCR NEGATIVE NEGATIVE Final    Comment:        The GeneXpert MRSA Assay (FDA approved for NASAL specimens only), is one component of a comprehensive MRSA colonization surveillance program. It is not intended to diagnose MRSA infection nor to guide or monitor treatment for MRSA infections. Performed at [facility], [place] [address] [place], [place] [address]       Radiology Studies: No results found.  Scheduled Meds: . amLODipine  5 mg Oral Daily  . aspirin  81 mg Oral q morning  . atorvastatin  40 mg Oral Daily  . Chlorhexidine Gluconate Cloth  6 each Topical Daily  . docusate sodium  100 mg Oral BID  . dorzolamide-timolol  1 drop Both Eyes BID  . DULoxetine  60 mg Oral Daily  . famotidine  20 mg Oral Daily  . feeding supplement  237 mL Oral BID BM  . insulin aspart  0-9 Units Subcutaneous Q4H  . insulin glargine  15 Units Subcutaneous Daily  . latanoprost  1 drop Both Eyes QHS  . levothyroxine  50 mcg Oral Daily  . tamsulosin  0.4 mg Oral Daily   Continuous Infusions: . sodium chloride Stopped (01/14/21 2320)     LOS: 4 days   Time spent: 26 minutes   Darliss Cheney, MD [facility]  01/19/2021, 1:22 PM   How to contact the [facility]  Attending or Consulting provider [place] or covering provider during after hours [place], for this patient?  1. Check the care team in [facility] and look for a) attending/consulting TRH provider listed and b) the [facility] team listed. Page or secure chat [phone]. 2. Log into [email or web address] and use [place]'s universal password to access. If you do not have the password, please contact the hospital operator. 3. Locate the [facility] provider you are looking for under [facility] and page to a number that you can be directly reached. 4. If you still have difficulty reaching the provider, please page the [facility] (Director on Call) for the Hospitalists listed on amion for assistance.

## 2021-01-19 NOTE — NC FL2 (Signed)
[place] LEVEL OF CARE SCREENING TOOL     IDENTIFICATION  Patient Name: Hayley Fisher Birthdate: August 10, 1931 Sex: female Admission Date (Current Location): 01/14/2021  [facility] and [place] Number:  Herbalist and Address:  [facility],  [place] [address] [place]      Provider Number: [id number]  Attending Physician Name and Address:  Darliss Cheney, MD  Relative Name and Phone Number:  [name] Daughter [phone],GBTDVV,OHYW Sister [phone]    Current Level of Care: Hospital Recommended Level of Care: [place] Prior Approval Number:    Date Approved/Denied:   PASRR Number: [id number]  Discharge Plan: SNF    Current Diagnoses: Patient Active Problem List   Diagnosis Date Noted  . Sepsis ([place]) 01/15/2021  . Acute encephalopathy 12/09/2020  . Hyperbilirubinemia 12/09/2020  . Hypothyroidism 12/09/2020  . Memory changes 10/20/2020  . Acute on chronic anemia 09/21/2020  . Gastroesophageal reflux disease without esophagitis 09/16/2020  . Urinary frequency 09/11/2020  . Stage 1 decubitus ulcer 07/30/2020  . Uncontrolled type 2 diabetes mellitus with hyperglycemia ([place]) 07/02/2020  . Unstable gait 04/03/2018  . Frequent falls 04/03/2018  . Age-related physical debility 03/22/2018  . Skin ulcer of sacrum, limited to breakdown of skin ([place]) 03/16/2017  . Spinal stenosis of lumbar region 01/13/2016  . Chronic venous insufficiency 11/09/2015  . DDD (degenerative disc disease), lumbosacral 11/24/2014  . Constipation by delayed colonic transit 11/24/2014  . Frequent UTI 10/29/2012  . Type 2 diabetes mellitus with diabetic neuropathy ([place]) 10/19/2006  . Hyperlipidemia associated with type 2 diabetes mellitus ([place]) 10/19/2006  . Hypertension associated with diabetes ([place]) 10/19/2006  . Osteopenia 10/19/2006    Orientation RESPIRATION BLADDER Height & Weight     Self,Place  Normal External catheter  Weight: 66.5 kg Height:  5\' 5"  (165.1 cm)  BEHAVIORAL SYMPTOMS/MOOD NEUROLOGICAL BOWEL NUTRITION STATUS      Incontinent Diet (Regular)  AMBULATORY STATUS COMMUNICATION OF NEEDS Skin   Extensive Assist Verbally Other (Comment) (Stage 2 on sacrum)                       Personal Care Assistance Level of Assistance  Bathing,Feeding,Dressing Bathing Assistance: Maximum assistance Feeding assistance: Independent Dressing Assistance: Maximum assistance     Functional Limitations Info  Sight,Hearing,Speech Sight Info: Impaired Hearing Info: Adequate Speech Info: Adequate    SPECIAL CARE FACTORS FREQUENCY  PT (By licensed PT),OT (By licensed OT)     PT Frequency: x5 week OT Frequency: x5 week            Contractures Contractures Info: Not present    Additional Factors Info  Code Status,Allergies,Insulin Sliding Scale Code Status Info: FULL Allergies Info: Levemir (Insulin Detemir)   Insulin Sliding Scale Info: insulin aspart (novoLOG) injection 0-9 Units  Dose: 0-9 Units  Freq: Every 4 hours Route: [place],insulin glargine (LANTUS) injection 15 Units  Dose: 15 Units  Freq: Daily Route: [place]       Current Medications (01/19/2021):  This is the current hospital active medication list Current Facility-Administered Medications  Medication Dose Route Frequency Provider Last Rate Last Admin  . 0.9 %  sodium chloride infusion  10 mL/hr Intravenous Once Toy Baker, MD   Held at 01/14/21 2320  . acetaminophen (TYLENOL) tablet 650 mg  650 mg Oral Q6H PRN Doutova, Anastassia, MD       Or  . acetaminophen (TYLENOL) suppository 650 mg  650 mg Rectal Q6H PRN Toy Baker, MD      .  amLODipine (NORVASC) tablet 5 mg  5 mg Oral Daily Darliss Cheney, MD   5 mg at 01/19/21 0939  . aspirin chewable tablet 81 mg  81 mg Oral q morning Doutova, Anastassia, MD   81 mg at 01/18/21 0917  . atorvastatin (LIPITOR) tablet 40 mg  40 mg Oral Daily Doutova, Nyoka Lint, MD   40 mg at 01/19/21  0939  . bisacodyl (DULCOLAX) suppository 10 mg  10 mg Rectal Daily PRN Toy Baker, MD      . Chlorhexidine Gluconate Cloth 2 % PADS 6 each  6 each Topical Daily Opyd, Ilene Qua, MD   6 each at 01/18/21 0913  . docusate sodium (COLACE) capsule 100 mg  100 mg Oral BID Toy Baker, MD   100 mg at 01/19/21 0939  . dorzolamide-timolol (COSOPT) 22.3-6.8 MG/ML ophthalmic solution 1 drop  1 drop Both Eyes BID Toy Baker, MD   1 drop at 01/19/21 0942  . DULoxetine (CYMBALTA) DR capsule 60 mg  60 mg Oral Daily Doutova, Anastassia, MD   60 mg at 01/19/21 0939  . famotidine (PEPCID) tablet 20 mg  20 mg Oral Daily Doutova, Anastassia, MD   20 mg at 01/19/21 0939  . feeding supplement (ENSURE ENLIVE / ENSURE PLUS) liquid 237 mL  237 mL Oral BID BM Doutova, Anastassia, MD   237 mL at 01/18/21 1635  . HYDROcodone-acetaminophen (NORCO/VICODIN) 5-325 MG per tablet 1-2 tablet  1-2 tablet Oral Q4H PRN Toy Baker, MD   2 tablet at 01/15/21 0234  . insulin aspart (novoLOG) injection 0-9 Units  0-9 Units Subcutaneous Q4H Toy Baker, MD   1 Units at 01/19/21 0940  . insulin glargine (LANTUS) injection 15 Units  15 Units Subcutaneous Daily Toy Baker, MD   15 Units at 01/19/21 0940  . latanoprost (XALATAN) 0.005 % ophthalmic solution 1 drop  1 drop Both Eyes QHS Doutova, Anastassia, MD   1 drop at 01/18/21 2234  . levothyroxine (SYNTHROID) tablet 50 mcg  50 mcg Oral Daily Toy Baker, MD   50 mcg at 01/19/21 0612  . polyethylene glycol (MIRALAX / GLYCOLAX) packet 17 g  17 g Oral Daily PRN Doutova, Anastassia, MD      . tamsulosin (FLOMAX) capsule 0.4 mg  0.4 mg Oral Daily Doutova, Anastassia, MD   0.4 mg at 01/19/21 0939  . witch hazel-glycerin (TUCKS) pad   Topical PRN Toy Baker, MD         Discharge Medications: Please see discharge summary for a list of discharge medications.  Relevant Imaging Results:  Relevant Lab Results:   Additional  Information [phone]  Purcell Mouton, RN

## 2021-01-19 NOTE — TOC Progression Note (Signed)
Transition of Care [facility]) - Progression Note    Patient Details  Name: Hayley Fisher MRN: [id number] Date of Birth: 1931-06-18  Transition of [facility]) CM/SW Contact  Purcell Mouton, RN Phone Number: 01/19/2021, 4:33 PM  Clinical Narrative:    Spoke with pt's daughter concerning SNF. The only bed offers were [facility] and [facility]. Daughter asked for another day, explained to daughter that pt would be ready for discharge in the am, need to start insurance authorization.       Barriers to Discharge: No Barriers Identified  Expected Discharge Plan and Services                                                 Social Determinants of Health (SDOH) Interventions    Readmission Risk Interventions Readmission Risk Prevention Plan 07/05/2020  Transportation Screening Complete  Home Care Screening Complete  Medication Review (RN CM) Complete  Some recent data might be hidden

## 2021-01-20 DIAGNOSIS — R6520 Severe sepsis without septic shock: Secondary | ICD-10-CM | POA: Diagnosis not present

## 2021-01-20 DIAGNOSIS — E1165 Type 2 diabetes mellitus with hyperglycemia: Secondary | ICD-10-CM | POA: Diagnosis not present

## 2021-01-20 DIAGNOSIS — G9340 Encephalopathy, unspecified: Secondary | ICD-10-CM | POA: Diagnosis not present

## 2021-01-20 DIAGNOSIS — A419 Sepsis, unspecified organism: Secondary | ICD-10-CM | POA: Diagnosis not present

## 2021-01-20 LAB — CULTURE, BLOOD (ROUTINE X 2)
Culture: NO GROWTH
Culture: NO GROWTH

## 2021-01-20 LAB — GLUCOSE, CAPILLARY
Glucose-Capillary: 160 mg/dL — ABNORMAL HIGH (ref 70–99)
Glucose-Capillary: 185 mg/dL — ABNORMAL HIGH (ref 70–99)
Glucose-Capillary: 209 mg/dL — ABNORMAL HIGH (ref 70–99)
Glucose-Capillary: 215 mg/dL — ABNORMAL HIGH (ref 70–99)
Glucose-Capillary: 219 mg/dL — ABNORMAL HIGH (ref 70–99)
Glucose-Capillary: 220 mg/dL — ABNORMAL HIGH (ref 70–99)
Glucose-Capillary: 271 mg/dL — ABNORMAL HIGH (ref 70–99)
Glucose-Capillary: 62 mg/dL — ABNORMAL LOW (ref 70–99)

## 2021-01-20 LAB — RESP PANEL BY RT-PCR (FLU A&B, COVID) ARPGX2
Influenza A by PCR: NEGATIVE
Influenza B by PCR: NEGATIVE
SARS Coronavirus 2 by RT PCR: NEGATIVE

## 2021-01-20 MED ORDER — DEBROX 6.5 % OT SOLN: 2.0000 [drp] | Freq: Two times a day (BID) | OTIC | Status: DC | PRN

## 2021-01-20 NOTE — Progress Notes (Signed)
[facility] 0722 Manufacturing engineer [facility]) Hospital Liaison note:  This patient is currently enrolled in [facility] outpatient-based Palliative Care. Will continue to follow for disposition.  Please call with any outpatient palliative questions or concerns.  Thank you, Lorelee Market, LPN [facility] Liaison [phone]

## 2021-01-20 NOTE — Progress Notes (Signed)
Physical Therapy Treatment Patient Details Name: Hayley Fisher MRN: [id number] DOB: 04-15-1931 Today's Date: 01/20/2021    History of Present Illness 87 y.o. female brought from SNF with decreased p.o. intake noted to decreased LOC for about a week or so. admitted with sepsis d/t UTI and bil DVT; infrarenal IVC filter placed 5/28. PMH includes with medical history significant of IDDM, hypothyroidism, hypertension, anemia, constipation, dementia    PT Comments    Pt pleasant and agreeable to mobilize.  Pt assisted with standing and pivoting to recliner.  Pt too fatigued to ambulate today.  Continue to recommend SNF upon d/c.  Follow Up Recommendations  SNF     Equipment Recommendations  None recommended by PT    Recommendations for Other Services       Precautions / Restrictions Precautions Precautions: Fall    Mobility  Bed Mobility Overal bed mobility: Needs Assistance Bed Mobility: Supine to Sit     Supine to sit: Mod assist;HOB elevated     General bed mobility comments: pt able to initiate however requiring assist to rise trunk and scoot to EOB    Transfers Overall transfer level: Needs assistance Equipment used: Rolling walker (2 wheeled) Transfers: Sit to/from [facility] Sit to Stand: Mod assist Stand pivot transfers: Min assist       General transfer comment: multimodal cues for hand placement, weight shifting, small steps over to recliner; fatigued after transfer  Ambulation/Gait             General Gait Details: pt felt unable to ambulate today but agreeable for OOB to recliner   Stairs             Wheelchair Mobility    Modified Rankin (Stroke Patients Only)       Balance Overall balance assessment: Needs assistance       Postural control: Posterior lean Standing balance support: Bilateral upper extremity supported Standing balance-Leahy Scale: Poor Standing balance comment: reliant on UE support                             Cognition Arousal/Alertness: Awake/alert Behavior During Therapy: WFL for tasks assessed/performed Overall Cognitive Status: History of cognitive impairments - at baseline                                 General Comments: able to follow simple commands with increased time      Exercises      General Comments        Pertinent Vitals/Pain Pain Assessment: Faces Faces Pain Scale: Hurts little more Pain Location: "back there"  "buttocks" Pain Descriptors / Indicators: Sore Pain Intervention(s): Repositioned;Monitored during session    Home Living                      Prior Function            PT Goals (current goals can now be found in the care plan section) Progress towards PT goals: Progressing toward goals    Frequency    Min 2X/week      PT Plan Current plan remains appropriate    Co-evaluation              AM-PAC PT "6 Clicks" Mobility   Outcome Measure  Help needed turning from your back to your side while in a flat bed without using bedrails?: A  Little Help needed moving from lying on your back to sitting on the side of a flat bed without using bedrails?: A Lot Help needed moving to and from a bed to a chair (including a wheelchair)?: A Lot Help needed standing up from a chair using your arms (e.g., wheelchair or bedside chair)?: A Lot Help needed to walk in hospital room?: A Lot Help needed climbing 3-5 steps with a railing? : Total 6 Click Score: 12    End of Session Equipment Utilized During Treatment: Gait belt Activity Tolerance: Patient limited by fatigue Patient left: with call bell/phone within reach;in chair;with chair alarm set   PT Visit Diagnosis: Other abnormalities of gait and mobility (R26.89);Muscle weakness (generalized) (M62.81)     Time: 3361-2244 PT Time Calculation (min) (ACUTE ONLY): 11 min  Charges:  $Therapeutic Activity: 8-22 mins                     Jannette Spanner PT,  DPT Acute Rehabilitation Services Pager: [phone] Office: [phone]  Donella Pascarella,KATHrine E 01/20/2021, 11:46 AM

## 2021-01-20 NOTE — TOC Progression Note (Signed)
Transition of Care [facility]) - Progression Note    Patient Details  Name: Hayley Fisher MRN: [id number] Date of Birth: [birth year]  [facility] CM/SW Contact  Purcell Mouton, RN Phone Number: 01/20/2021, 3:27 PM  Clinical Narrative:    Pt will discharge to [facility]. Daughter is aware. Pt was placed on Will Call List for PTAR. RN is aware. Waiting for COVID test.       Barriers to Discharge: No Barriers Identified  Expected Discharge Plan and Services           Expected Discharge Date: 01/20/21                                     Social Determinants of Health (SDOH) Interventions    Readmission Risk Interventions Readmission Risk Prevention Plan 07/05/2020  Transportation Screening Complete  Home Care Screening Complete  Medication Review (RN CM) Complete  Some recent data might be hidden

## 2021-01-20 NOTE — Discharge Summary (Signed)
Physician Discharge Summary  Hayley Fisher NAT:[id number] DOB: 12-26-1930 DOA: 01/14/2021  PCP: Danna Hefty, DO  Admit date: 01/14/2021 Discharge date: 01/20/2021  Admitted From: SNF Disposition: SNF  Recommendations for Outpatient Follow-up:  1. Follow up with PCP in 1 week with repeat CBC/BMP 2. Outpatient follow-up with palliative care 3. Follow up in ED if symptoms worsen or new appear   Home Health: No Equipment/Devices: None  Discharge Condition: Guarded CODE STATUS: Full Diet recommendation: Heart healthy/carb modified/diet as per SLP recommendations  Brief/Interim Summary: 87 y.o.femalewith medical history significant of IDDM, hypothyroidism, hypertension, anemia, constipation, dementia, recent admission in April 2022 for acute delirium/metabolic encephalopathy secondary to UTI and hypoglycemia with significant improvement after treatment.  She was brought from SNF with decreased p.o. intake and confusion.  Apparently, patient had a Foley catheter placed on 01/12/2021 and was being treated for UTI with Macrobid.  On presentation to the ED, patient was alert and oriented.  She was admitted with severe sepsis secondary UTI and started on IV Rocephin.  Her encephalopathy subsequently resolved; cultures remained negative; Rocephin was discontinued.  She had a drop in hemoglobin requiring packed red cells transfusion.  She was also found to have bilateral lower extremity age indeterminate DVT for which he underwent IVC filter placement by IR on 01/16/2021.  Palliative care evaluated the patient and has signed off.  Currently remains full code.  She will be discharged to SNF once bed is available.  Discharge Diagnoses:   Severe sepsis: Present on admission; resolved UTI: Present on admission -Treated with Rocephin which was subsequently discontinued.  Cultures negative so far. -Currently hemodynamically stable and afebrile. -She will be discharged to SNF once bed is  available.  Acute metabolic encephalopathy: Possibly from sepsis and UTI History of dementia -Mental status is probably back to baseline.   - Palliative care evaluated the patient and has signed off.  Currently remains full code.  Recommend outpatient palliative care follow-up  Acute on chronic anemia of chronic disease -Status post 2 units packed red cells transfusion during this hospitalization.  Hemoglobin on 01/19/2021 was 9.5.  No signs of overt GI bleed.  Monitor as an outpatient  Bilateral lower extremity age indeterminate DVT -underwent IVC filter placement by IR on 01/16/2021  Essential hypertension -Blood pressure intermittently elevated.  Continue home regimen  Diabetes mellitus type 2 with hyperglycemia and hypoglycemia -Continue home regimen along with carb modified diet.  Hyperlipidemia -Continue Lipitor  Severe hypoalbuminemia -Follow nutrition recommendations.  Stage II pressure ulcer at the sacrum: Present on admission -Management as per wound care  Generalized deconditioning -PT/OT recommend SNF.    Discharge Instructions  Discharge Instructions    Amb Referral to Palliative Care   Complete by: As directed    Diet - low sodium heart healthy   Complete by: As directed    Diet Carb Modified   Complete by: As directed    Increase activity slowly   Complete by: As directed    No wound care   Complete by: As directed      Allergies as of 01/20/2021      Reactions   Levemir [insulin Detemir] Itching      Medication List    STOP taking these medications   Alcohol Swabs Pads   nitrofurantoin (macrocrystal-monohydrate) 100 MG capsule Commonly known as: MACROBID   sulfamethoxazole-trimethoprim 800-160 MG tablet Commonly known as: BACTRIM DS     TAKE these medications   Accu-Chek Aviva Plus test strip Generic drug: glucose blood USE  TO TEST BLOOD SUGAR UP TO 3 TIMES A DAY. What changed: See the new instructions.   Accu-Chek Softclix  Lancets lancets USE TO TEST BLOOD SUGAR UP TO 3 TIMES A DAY. What changed: See the new instructions.   aspirin 81 MG chewable tablet Chew 81 mg by mouth every morning.   atorvastatin 40 MG tablet Commonly known as: LIPITOR Take 1 tablet (40 mg total) by mouth daily.   brimonidine 0.1 % Soln Commonly known as: ALPHAGAN P Place 1 drop into both eyes 2 (two) times daily.   Catheter Self-Adhesive Urinary Misc 1 patch by Does not apply route daily.   Debrox 6.5 % OTIC solution Generic drug: carbamide peroxide Place 2 drops into both ears 2 (two) times daily as needed (ear wax).   docusate sodium 100 MG capsule Commonly known as: COLACE Take 1 capsule (100 mg total) by mouth 2 (two) times daily.   dorzolamide-timolol 22.3-6.8 MG/ML ophthalmic solution Commonly known as: COSOPT Place 1 drop into both eyes 2 (two) times daily.   DULoxetine 60 MG capsule Commonly known as: CYMBALTA TAKE 1 CAPSULE BY MOUTH EVERY DAY What changed:   how much to take  how to take this  when to take this  additional instructions   enalapril 20 MG tablet Commonly known as: VASOTEC Take 1 tablet (20 mg total) by mouth daily.   famotidine 20 MG tablet Commonly known as: Pepcid Take 1 tablet (20 mg total) by mouth daily. If needed can take twice a day.   ferrous sulfate 325 (65 FE) MG tablet Take 325 mg by mouth daily with breakfast.   HumaLOG 100 UNIT/ML injection Generic drug: insulin lispro Inject 2-15 Units into the skin See admin instructions. Per sliding scale, subcutaneous with meals if blood sugar is 70-150 = 0 units  151-200 = 2 units  201-250 = 5 units  251-300 = 8 units  301-350 = 11 units  351-400 = 15 units  >400 = Call MD.   Inject units into skin according to the sliding scale three times daily with meals.   insulin glargine 100 UNIT/ML injection Commonly known as: LANTUS Inject 0.15 mLs (15 Units total) into the skin daily.   ketoconazole 2 % cream Commonly known  as: NIZORAL Apply 1 application topically daily.   levothyroxine 50 MCG tablet Commonly known as: SYNTHROID Take 1 tablet (50 mcg total) by mouth daily.   metFORMIN 1000 MG tablet Commonly known as: GLUCOPHAGE Take 1,000 mg by mouth daily. What changed: Another medication with the same name was removed. Continue taking this medication, and follow the directions you see here.   mirabegron ER 25 MG Tb24 tablet Commonly known as: Myrbetriq Take 1 tablet (25 mg total) by mouth daily.   ondansetron 4 MG disintegrating tablet Commonly known as: Zofran ODT Take 1 tablet (4 mg total) by mouth every 8 (eight) hours as needed for nausea.   polyethylene glycol powder 17 GM/SCOOP powder Commonly known as: GLYCOLAX/MIRALAX Take 17 g by mouth daily.   PreserVision AREDS 2+Multi Vit Caps Take 1 capsule by mouth daily.   Rybelsus 3 MG Tabs Generic drug: Semaglutide Take 3 mg by mouth daily.   Sure Comfort Insulin Syringe 31G X 5/16" 0.3 ML Misc Generic drug: Insulin Syringe-Needle U-100 use 1 syringe with insulin daily What changed: See the new instructions.   tamsulosin 0.4 MG Caps capsule Commonly known as: FLOMAX Take 0.4 mg by mouth daily.   Vyzulta 0.024 % Soln Generic drug: Latanoprostene Bunod  Place 1 drop into both eyes at bedtime.   witch hazel-glycerin pad Commonly known as: TUCKS Apply topically as needed for itching.       Contact information for after-discharge care    Destination    [facility] Preferred SNF .   Service: Skilled Nursing Contact information: [address] [address] [phone]                 Allergies  Allergen Reactions  . Levemir [Insulin Detemir] Itching    Consultations: IR and palliative care   Procedures/Studies: DG Abd 1 View  Result Date: 01/15/2021 CLINICAL DATA:  Constipation EXAM: ABDOMEN - 1 VIEW COMPARISON:  10/23/2020 FINDINGS: Normal abdominal gas pattern. Relatively  small volume stool. No gross free intraperitoneal gas. Underpenetration precludes evaluation of the visceral shadows. Vascular calcifications noted within the abdominal aorta. IMPRESSION: Normal abdominal gas pattern. Electronically Signed   By: Fidela Salisbury MD   On: 01/15/2021 01:51   CT Head Wo Contrast  Result Date: 01/14/2021 CLINICAL DATA:  Mental status change. EXAM: CT HEAD WITHOUT CONTRAST TECHNIQUE: Contiguous axial images were obtained from the base of the skull through the vertex without intravenous contrast. COMPARISON:  CT head 09/18/2011. FINDINGS: Brain: Cerebral ventricle sizes are concordant with the degree of cerebral volume loss. Patchy and confluent areas of decreased attenuation are noted throughout the deep and periventricular white matter of the cerebral hemispheres bilaterally, compatible with chronic microvascular ischemic disease. No evidence of large-territorial acute infarction. No parenchymal hemorrhage. No mass lesion. No extra-axial collection. No mass effect or midline shift. No hydrocephalus. Basilar cisterns are patent. Empty sella. Findings is often a normal anatomic variant but can be associated with idiopathic intracranial hypertension (pseudotumor cerebri). Vascular: No hyperdense vessel. Atherosclerotic calcifications are present within the cavernous internal carotid arteries. Skull: No acute fracture. Similar-appearing permeative pattern of the clivus and sphenoid bone which is unchanged. Status post left mastoidectomy again noted. Sinuses/Orbits: Paranasal sinuses and mastoid air cells are clear. Lens replacement. Otherwise the orbits are unremarkable. Other: None. IMPRESSION: No acute intracranial abnormality. Electronically Signed   By: Iven Finn M.D.   On: 01/14/2021 23:08   IR IVC FILTER PLMT / S&I Burke Keels GUID/MOD SED  Result Date: 01/16/2021 INDICATION: DVT with contraindication to anticoagulation. Please perform IVC filter placement for caval interruption  purposes. Given patient's advanced age and multiple medical comorbidities, the IVC filter will be considered a permanent device and the patient will not be actively followed by the interventional radiology department for retrieval. EXAM: ULTRASOUND GUIDANCE FOR VASCULAR ACCESS IVC CATHETERIZATION AND VENOGRAM IVC FILTER INSERTION COMPARISON:  CT abdomen and pelvis-12/09/2017 MEDICATIONS: None. ANESTHESIA/SEDATION: None CONTRAST:  None; CO2 was utilized for this examination FLUOROSCOPY TIME:  1 minute, 30 seconds (83 mGy) COMPLICATIONS: None immediate PROCEDURE: Informed consent was obtained from the patient following explanation of the procedure, risks, benefits and alternatives. The patient understands, agrees and consents for the procedure. All questions were addressed. A time out was performed prior to the initiation of the procedure. Maximal barrier sterile technique utilized including caps, mask, sterile gowns, sterile gloves, large sterile drape, hand hygiene, and Betadine prep. Under sterile condition and local anesthesia, right internal jugular venous access was performed with ultrasound. An ultrasound image was saved and sent to PACS. Over a guidewire, the IVC filter delivery sheath and inner dilator were advanced into the IVC just above the IVC bifurcation. Contrast injection was performed for an IVC venogram. Through the delivery sheath, a retrievable [facility]  IVC filter was deployed below the level of the renal veins and above the IVC bifurcation. Limited post deployment venacavagram was performed. The delivery sheath was removed and hemostasis was obtained with manual compression. A dressing was placed. The patient tolerated the procedure well without immediate post procedural complication. FINDINGS: The IVC is patent. No evidence of thrombus, stenosis, or occlusion. No variant venous anatomy. Successful placement of the IVC filter below the level of the renal veins. IMPRESSION: Successful ultrasound and  fluoroscopically guided placement of an infrarenal retrievable IVC filter via right jugular approach. PLAN: Due to patient related comorbidities and/or clinical necessity, this IVC filter should be considered a permanent device. This patient will not be actively followed for future filter retrieval. Electronically Signed   By: Sandi Mariscal M.D.   On: 01/16/2021 13:43   DG Chest Port 1 View  Result Date: 01/14/2021 CLINICAL DATA:  Failure to thrive EXAM: PORTABLE CHEST 1 VIEW COMPARISON:  12/01/2020 FINDINGS: Lung volumes are small and there is bibasilar atelectasis, unchanged from prior examination. No pneumothorax or pleural effusion. Cardiac size within normal limits. Pulmonary vascularity is normal. No acute bone abnormality. IMPRESSION: Stable pulmonary hypoinflation. Electronically Signed   By: Fidela Salisbury MD   On: 01/14/2021 22:26   VAS [place] LOWER EXTREMITY VENOUS (DVT)  Result Date: 01/15/2021  Lower Venous DVT Study Patient Name:  CAPRISHA BRIDGETT  Date of Exam:   01/15/2021 Medical Rec #: [id number]       Accession #:    [id number] Date of Birth: [birth year]       Patient Gender: F Patient Age:   89Y Exam Location:  [facility] Procedure:      VAS [place] LOWER EXTREMITY VENOUS (DVT) Referring Phys: [place] --------------------------------------------------------------------------------  Indications: Edema.  Risk Factors: None identified. Limitations: Poor ultrasound/tissue interface and patient positioning. Comparison Study: No prior studies. Performing Technologist: Oliver Hum RVT  Examination Guidelines: A complete evaluation includes B-mode imaging, spectral Doppler, color Doppler, and power Doppler as needed of all accessible portions of each vessel. Bilateral testing is considered an integral part of a complete examination. Limited examinations for reoccurring indications may be performed as noted. The reflux portion of the exam is performed with the patient in reverse  Trendelenburg.  +---------+---------------+---------+-----------+----------+-------------------+ RIGHT    CompressibilityPhasicitySpontaneityPropertiesThrombus Aging      +---------+---------------+---------+-----------+----------+-------------------+ CFV      Full           Yes      Yes                                      +---------+---------------+---------+-----------+----------+-------------------+ SFJ      Full                                                             +---------+---------------+---------+-----------+----------+-------------------+ FV Prox  Full                                                             +---------+---------------+---------+-----------+----------+-------------------+ FV Mid  Full                                                             +---------+---------------+---------+-----------+----------+-------------------+ FV DistalFull                                                             +---------+---------------+---------+-----------+----------+-------------------+ PFV      Partial        Yes      Yes                  Age Indeterminate   +---------+---------------+---------+-----------+----------+-------------------+ POP      Full           Yes      Yes                                      +---------+---------------+---------+-----------+----------+-------------------+ PTV      Full                                                             +---------+---------------+---------+-----------+----------+-------------------+ PERO                                                  Not well visualized +---------+---------------+---------+-----------+----------+-------------------+   +---------+---------------+---------+-----------+----------+-------------------+ LEFT     CompressibilityPhasicitySpontaneityPropertiesThrombus Aging       +---------+---------------+---------+-----------+----------+-------------------+ CFV      Full           Yes      Yes                                      +---------+---------------+---------+-----------+----------+-------------------+ SFJ      Full                                                             +---------+---------------+---------+-----------+----------+-------------------+ FV Prox  Full                                                             +---------+---------------+---------+-----------+----------+-------------------+ FV Mid   Partial  Age Indeterminate   +---------+---------------+---------+-----------+----------+-------------------+ FV DistalFull                                                             +---------+---------------+---------+-----------+----------+-------------------+ PFV      Full                                                             +---------+---------------+---------+-----------+----------+-------------------+ POP      Full           Yes      Yes                                      +---------+---------------+---------+-----------+----------+-------------------+ PTV      Full                                                             +---------+---------------+---------+-----------+----------+-------------------+ PERO                                                  Not well visualized +---------+---------------+---------+-----------+----------+-------------------+     Summary: RIGHT: - Findings consistent with age indeterminate deep vein thrombosis involving the right proximal profunda vein. - No cystic structure found in the popliteal fossa.  LEFT: - Findings consistent with age indeterminate deep vein thrombosis involving the left femoral vein. - No cystic structure found in the popliteal fossa.  *See table(s) above for measurements and observations. Electronically  signed by Monica Martinez MD on 01/15/2021 at 12:35:38 PM.    Final        Subjective: Patient seen and examined at bedside.  Poor historian.  No overnight fever, vomiting reported.  Discharge Exam: Vitals:   01/20/21 1030 01/20/21 1241  BP: 130/69 (!) 150/93  Pulse:  93  Resp:  18  Temp:  98.5 F (36.9 C)  SpO2:  100%    General exam: Elderly female lying in bed.  Awake, very poor historian.  Currently on room air.  Slightly confused. Respiratory system: Bilateral decreased breath sounds at bases with some scattered crackles Cardiovascular system: S1 & S2 heard, Rate controlled Gastrointestinal system: Abdomen is nondistended, soft and nontender. Normal bowel sounds heard. Extremities: No cyanosis, clubbing; trace lower extremity edema     The results of significant diagnostics from this hospitalization (including imaging, microbiology, ancillary and laboratory) are listed below for reference.     Microbiology: Recent Results (from the past 240 hour(s))  Urine Culture     Status: None   Collection Time: 01/14/21  9:52 PM   Specimen: Urine, Clean Catch  Result Value Ref Range Status   Specimen Description   Final    URINE, CLEAN CATCH Performed at [facility], [place] [address] [place], [place] [address]    Special Requests   Final    NONE Performed at [facility], [place] [address] [place], [place] [address]    Culture   Final    NO GROWTH Performed at [facility], [place] [address] [place], [place] [address]    Report Status 01/16/2021 FINAL  Final  Resp Panel by RT-PCR (Flu A&B, Covid) Urine, Bag (ped)     Status: None   Collection Time: 01/14/21  9:54 PM   Specimen: Urine, Bag (ped); Nasopharyngeal(NP) swabs in vial transport medium  Result Value Ref Range Status   SARS Coronavirus 2 by RT PCR NEGATIVE NEGATIVE Final    Comment: (NOTE) SARS-CoV-2 target nucleic acids are NOT DETECTED.  The SARS-CoV-2 RNA is  generally detectable in upper respiratory specimens during the acute phase of infection. The lowest concentration of SARS-CoV-2 viral copies this assay can detect is 138 copies/mL. A negative result does not preclude SARS-Cov-2 infection and should not be used as the sole basis for treatment or other patient management decisions. A negative result may occur with  improper specimen collection/handling, submission of specimen other than nasopharyngeal swab, presence of viral mutation(s) within the areas targeted by this assay, and inadequate number of viral copies(<138 copies/mL). A negative result must be combined with clinical observations, patient history, and epidemiological information. The expected result is Negative.  Fact Sheet for Patients:  EntrepreneurPulse.com.au  Fact Sheet for Healthcare Providers:  IncredibleEmployment.be  This test is no t yet approved or cleared by the [place] FDA and  has been authorized for detection and/or diagnosis of SARS-CoV-2 by FDA under an Emergency Use Authorization (EUA). This EUA will remain  in effect (meaning this test can be used) for the duration of the COVID-19 declaration under Section 564(b)(1) of the Act, 21 U.S.C.section 360bbb-3(b)(1), unless the authorization is terminated  or revoked sooner.       Influenza A by PCR NEGATIVE NEGATIVE Final   Influenza B by PCR NEGATIVE NEGATIVE Final    Comment: (NOTE) The Xpert Xpress SARS-CoV-2/FLU/RSV plus assay is intended as an aid in the diagnosis of influenza from Nasopharyngeal swab specimens and should not be used as a sole basis for treatment. Nasal washings and aspirates are unacceptable for Xpert Xpress SARS-CoV-2/FLU/RSV testing.  Fact Sheet for Patients: EntrepreneurPulse.com.au  Fact Sheet for Healthcare Providers: IncredibleEmployment.be  This test is not yet approved or cleared by the [place] FDA and has been authorized for detection and/or diagnosis of SARS-CoV-2 by FDA under an Emergency Use Authorization (EUA). This EUA will remain in effect (meaning this test can be used) for the duration of the COVID-19 declaration under Section 564(b)(1) of the Act, 21 U.S.C. section 360bbb-3(b)(1), unless the authorization is terminated or revoked.  Performed at [facility], [place] [address], [place], [place] [address]   Culture, blood (Routine X 2) w Reflex to ID Panel     Status: None   Collection Time: 01/14/21 10:09 PM   Specimen: BLOOD RIGHT FOREARM  Result Value Ref Range Status   Specimen Description   Final    BLOOD RIGHT FOREARM Performed at [facility], [place] [address], [place], [place] [address]    Special Requests   Final    BOTTLES DRAWN AEROBIC AND ANAEROBIC Blood Culture results may not be optimal due to an inadequate volume of blood received in culture bottles Performed at [facility], [address] [place], [place] [address]    Culture   Final    NO GROWTH 5 DAYS Performed at [facility], [place] [address] [place], [place] [address]    Report Status 01/20/2021 FINAL  Final  Culture, blood (Routine X 2) w Reflex to ID Panel     Status: None   Collection Time: 01/14/21 10:09 PM   Specimen: BLOOD RIGHT FOREARM  Result Value Ref Range Status   Specimen Description   Final    BLOOD RIGHT FOREARM Performed at [facility], [place] [address] [place], [place] [address]    Special Requests   Final    BOTTLES DRAWN AEROBIC AND ANAEROBIC Blood Culture results may not be optimal due to an excessive volume of blood received in culture bottles Performed at [place] [address] [place], [place] [address]    Culture   Final    NO GROWTH 5 DAYS Performed at [facility], [place] [address], [place], [place] [address]    Report Status 01/20/2021 FINAL  Final  MRSA PCR Screening      Status: None   Collection Time: 01/15/21  2:35 AM   Specimen: Nasal Mucosa; Nasopharyngeal  Result Value Ref Range Status   MRSA by PCR NEGATIVE NEGATIVE Final    Comment:        The GeneXpert MRSA Assay (FDA approved for NASAL specimens only), is one component of a comprehensive MRSA colonization surveillance program. It is not intended to diagnose MRSA infection nor to guide or monitor treatment for MRSA infections. Performed at [facility], [place] [address] [place], [place] [address]      Labs: BNP (last 3 results) No results for input(s): BNP in the last 8760 hours. Basic Metabolic Panel: Recent Labs  Lab 01/14/21 2209 01/15/21 0451 01/16/21 0207 01/17/21 0344 01/19/21 0343  NA 133* 135 136 139 139  K 4.6 3.9 4.9 4.2 3.9  CL 100 105 103 107 105  CO2 22 25 28 28 29   GLUCOSE 92 70 220* 87 149*  BUN 38* 29* 21 15 22   CREATININE 1.00 0.61 0.68 0.57 0.56  CALCIUM 8.5* 7.5* 8.2* 8.1* 7.9*  MG  --  1.2*  1.1* 2.0  --   --   PHOS  --  2.4*  2.4* 2.7  --   --    Liver Function Tests: Recent Labs  Lab 01/14/21 2209 01/15/21 0451  AST 16 13*  ALT 9 8  ALKPHOS 87 67  BILITOT 1.2 1.1  PROT 6.0* 4.9*  ALBUMIN 2.4* 1.9*   Recent Labs  Lab 01/14/21 2209  LIPASE 21   No results for input(s): AMMONIA in the last 168 hours. CBC: Recent Labs  Lab 01/14/21 2209 01/15/21 0451 01/15/21 0944 01/16/21 0207 01/17/21 0344 01/19/21 0343  WBC 6.8 5.7 5.9 5.8 5.1 5.2  NEUTROABS 5.5 4.7  --   --   --  3.8  HGB 7.4* 6.6* 8.9* 9.8* 9.8* 9.5*  HCT 24.0* 21.1* 27.6* 30.9* 30.7* 30.2*  MCV 98.4 97.7 95.8 95.7 96.2 98.1  PLT 362 320 260 313 316 263   Cardiac Enzymes: Recent Labs  Lab 01/15/21 0451  CKTOTAL 27*   BNP: Invalid input(s): POCBNP CBG: Recent Labs  Lab 01/20/21 0006 01/20/21 0402 01/20/21 0747 01/20/21 0826 01/20/21 1144  GLUCAP 219* 160* 62* 185* 215*   D-Dimer No results for input(s): DDIMER in the last 72 hours. Hgb  A1c No results  for input(s): HGBA1C in the last 72 hours. Lipid Profile No results for input(s): CHOL, HDL, LDLCALC, TRIG, CHOLHDL, LDLDIRECT in the last 72 hours. Thyroid function studies No results for input(s): TSH, T4TOTAL, T3FREE, THYROIDAB in the last 72 hours.  Invalid input(s): FREET3 Anemia work up No results for input(s): VITAMINB12, FOLATE, FERRITIN, TIBC, IRON, RETICCTPCT in the last 72 hours. Urinalysis    Component Value Date/Time   COLORURINE YELLOW 01/14/2021 2152   APPEARANCEUR TURBID (A) 01/14/2021 2152   LABSPEC 1.016 01/14/2021 2152   PHURINE 5.0 01/14/2021 2152   GLUCOSEU 50 (A) 01/14/2021 2152   HGBUR MODERATE (A) 01/14/2021 2152   HGBUR trace-intact 07/20/2010 1433   BILIRUBINUR NEGATIVE 01/14/2021 2152   BILIRUBINUR negative 09/09/2020 1604   BILIRUBINUR NEG 08/09/2016 1500   KETONESUR NEGATIVE 01/14/2021 2152   PROTEINUR 100 (A) 01/14/2021 2152   UROBILINOGEN 1.0 10/23/2020 1420   NITRITE NEGATIVE 01/14/2021 2152   LEUKOCYTESUR MODERATE (A) 01/14/2021 2152   Sepsis Labs Invalid input(s): PROCALCITONIN,  WBC,  LACTICIDVEN Microbiology Recent Results (from the past 240 hour(s))  Urine Culture     Status: None   Collection Time: 01/14/21  9:52 PM   Specimen: Urine, Clean Catch  Result Value Ref Range Status   Specimen Description   Final    URINE, CLEAN CATCH Performed at [facility], [place] [address] [place], [place] [address]    Special Requests   Final    NONE Performed at [facility], [place] [address], [place], [place] [address]    Culture   Final    NO GROWTH Performed at [facility], [place] [address] [place], [place] [address]    Report Status 01/16/2021 FINAL  Final  Resp Panel by RT-PCR (Flu A&B, Covid) Urine, Bag (ped)     Status: None   Collection Time: 01/14/21  9:54 PM   Specimen: Urine, Bag (ped); Nasopharyngeal(NP) swabs in vial transport medium  Result Value Ref Range Status   SARS  Coronavirus 2 by RT PCR NEGATIVE NEGATIVE Final    Comment: (NOTE) SARS-CoV-2 target nucleic acids are NOT DETECTED.  The SARS-CoV-2 RNA is generally detectable in upper respiratory specimens during the acute phase of infection. The lowest concentration of SARS-CoV-2 viral copies this assay can detect is 138 copies/mL. A negative result does not preclude SARS-Cov-2 infection and should not be used as the sole basis for treatment or other patient management decisions. A negative result may occur with  improper specimen collection/handling, submission of specimen other than nasopharyngeal swab, presence of viral mutation(s) within the areas targeted by this assay, and inadequate number of viral copies(<138 copies/mL). A negative result must be combined with clinical observations, patient history, and epidemiological information. The expected result is Negative.  Fact Sheet for Patients:  EntrepreneurPulse.com.au  Fact Sheet for Healthcare Providers:  IncredibleEmployment.be  This test is no t yet approved or cleared by the [place] FDA and  has been authorized for detection and/or diagnosis of SARS-CoV-2 by FDA under an Emergency Use Authorization (EUA). This EUA will remain  in effect (meaning this test can be used) for the duration of the COVID-19 declaration under Section 564(b)(1) of the Act, 21 U.S.C.section 360bbb-3(b)(1), unless the authorization is terminated  or revoked sooner.       Influenza A by PCR NEGATIVE NEGATIVE Final   Influenza B by PCR NEGATIVE NEGATIVE Final    Comment: (NOTE) The Xpert Xpress SARS-CoV-2/FLU/RSV plus assay is intended as an aid in  the diagnosis of influenza from Nasopharyngeal swab specimens and should not be used as a sole basis for treatment. Nasal washings and aspirates are unacceptable for Xpert Xpress SARS-CoV-2/FLU/RSV testing.  Fact Sheet for  Patients: EntrepreneurPulse.com.au  Fact Sheet for Healthcare Providers: IncredibleEmployment.be  This test is not yet approved or cleared by the [place] FDA and has been authorized for detection and/or diagnosis of SARS-CoV-2 by FDA under an Emergency Use Authorization (EUA). This EUA will remain in effect (meaning this test can be used) for the duration of the COVID-19 declaration under Section 564(b)(1) of the Act, 21 U.S.C. section 360bbb-3(b)(1), unless the authorization is terminated or revoked.  Performed at [facility], [place] [address] [place], [place] [address]   Culture, blood (Routine X 2) w Reflex to ID Panel     Status: None   Collection Time: 01/14/21 10:09 PM   Specimen: BLOOD RIGHT FOREARM  Result Value Ref Range Status   Specimen Description   Final    BLOOD RIGHT FOREARM Performed at [facility], [place] [address] [place], [place] [address]    Special Requests   Final    BOTTLES DRAWN AEROBIC AND ANAEROBIC Blood Culture results may not be optimal due to an inadequate volume of blood received in culture bottles Performed at [place] [address] [place], [place] [address]    Culture   Final    NO GROWTH 5 DAYS Performed at [facility], [place] [address], [place], [place] [address]    Report Status 01/20/2021 FINAL  Final  Culture, blood (Routine X 2) w Reflex to ID Panel     Status: None   Collection Time: 01/14/21 10:09 PM   Specimen: BLOOD RIGHT FOREARM  Result Value Ref Range Status   Specimen Description   Final    BLOOD RIGHT FOREARM Performed at [facility], [place] [address], [place], [place] [address]    Special Requests   Final    BOTTLES DRAWN AEROBIC AND ANAEROBIC Blood Culture results may not be optimal due to an excessive volume of blood received in culture bottles Performed at [place] [address], [place],  [place] [address]    Culture   Final    NO GROWTH 5 DAYS Performed at [facility], [place] [address], [place], [place] [address]    Report Status 01/20/2021 FINAL  Final  MRSA PCR Screening     Status: None   Collection Time: 01/15/21  2:35 AM   Specimen: Nasal Mucosa; Nasopharyngeal  Result Value Ref Range Status   MRSA by PCR NEGATIVE NEGATIVE Final    Comment:        The GeneXpert MRSA Assay (FDA approved for NASAL specimens only), is one component of a comprehensive MRSA colonization surveillance program. It is not intended to diagnose MRSA infection nor to guide or monitor treatment for MRSA infections. Performed at [facility], [place] [address] [place], [place] [address]      Time coordinating discharge: 35 minutes  SIGNED:   Aline August, MD  [facility] 01/20/2021, 2:17 PM

## 2021-01-20 NOTE — Progress Notes (Signed)
Patient ID: Hayley Fisher, female   DOB: [birth year], 87 y.o.   MRN: [id number]  PROGRESS NOTE    Rethel Sebek  MHD:[id number] DOB: [birth year] DOA: 01/14/2021 PCP: Danna Hefty, DO   Brief Narrative:  87 y.o.femalewith medical history significant of IDDM, hypothyroidism, hypertension, anemia, constipation, dementia, recent admission in April 2022 for acute delirium/metabolic encephalopathy secondary to UTI and hypoglycemia with significant improvement after treatment.  She was brought from SNF with decreased p.o. intake and confusion.  Apparently, patient had a Foley catheter placed on 01/12/2021 and was being treated for UTI with Macrobid.  On presentation to the ED, patient was alert and oriented.  She was admitted with severe sepsis secondary UTI and started on IV Rocephin.  Her encephalopathy subsequently resolved; cultures remained negative; Rocephin was discontinued.  She had a drop in hemoglobin requiring packed red cells transfusion.  She was also found to have bilateral lower extremity age indeterminate DVT for which he underwent IVC filter placement by IR on 01/16/2021.  Palliative care evaluated the patient and has signed off.  Currently remains full code.  Currently medically stable for discharge to SNF.  Assessment & Plan:   Severe sepsis: Present on admission; resolved UTI: Present on admission -Treated with Rocephin which was subsequently discontinued.  Cultures negative so far. -Currently hemodynamically stable and afebrile.  Acute metabolic encephalopathy: Possibly from sepsis and UTI History of dementia -Mental status is probably back to baseline.  Monitor mental status.  Fall precautions - Palliative care evaluated the patient and has signed off.  Currently remains full code.  Recommend outpatient palliative care follow-up  Acute on chronic anemia of chronic disease -Status post 2 units packed red cells transfusion during this hospitalization.  Hemoglobin on 01/19/2021  was 9.5.  No signs of overt GI bleed.  Monitor intermittently  Bilateral lower extremity age indeterminate DVT -underwent IVC filter placement by IR on 01/16/2021  Essential hypertension -Blood pressure intermittently elevated.  Continue amlodipine  Diabetes mellitus type 2 with hyperglycemia and hypoglycemia -Continue Lantus along with CBGs and SSI  Hyperlipidemia -Continue Lipitor  Severe hypoalbuminemia -Follow nutrition recommendations.  Stage II pressure ulcer at the sacrum: Present on admission -Management as per wound care  Generalized deconditioning -PT/OT recommend SNF.  Social worker following  DVT prophylaxis: SCDs Code Status: Full Family Communication: None at bedside Disposition Plan: Status is: Inpatient  Remains inpatient appropriate because:Inpatient level of care appropriate due to severity of illness   Dispo: The patient is from: SNF              Anticipated d/c is to: SNF              Patient currently is medically stable to d/c.   Difficult to place patient No  Consultants: IR and palliative care  Procedures: IVC filter placement on 01/16/2021 by IR  Antimicrobials:  Anti-infectives (From admission, onward)   Start     Dose/Rate Route Frequency Ordered Stop   01/16/21 2200  vancomycin (VANCOCIN) IVPB 1000 mg/200 mL premix  Status:  Discontinued        1,000 mg 200 mL/hr over 60 Minutes Intravenous Every 36 hours 01/15/21 0101 01/15/21 1418   01/15/21 1800  cefTRIAXone (ROCEPHIN) 1 g in sodium chloride 0.9 % 100 mL IVPB  Status:  Discontinued        1 g 200 mL/hr over 30 Minutes Intravenous Every 24 hours 01/15/21 1418 01/18/21 0821   01/15/21 1200  ceFEPIme (MAXIPIME) 2 g in sodium chloride 0.9 %  100 mL IVPB  Status:  Discontinued        2 g 200 mL/hr over 30 Minutes Intravenous Every 12 hours 01/15/21 0101 01/15/21 1418   01/15/21 0800  metroNIDAZOLE (FLAGYL) IVPB 500 mg  Status:  Discontinued        500 mg 100 mL/hr over 60 Minutes  Intravenous Every 8 hours 01/15/21 0051 01/15/21 1418   01/14/21 2345  ceFEPIme (MAXIPIME) 2 g in sodium chloride 0.9 % 100 mL IVPB        2 g 200 mL/hr over 30 Minutes Intravenous  Once 01/14/21 2334 01/15/21 0035   01/14/21 2345  metroNIDAZOLE (FLAGYL) IVPB 500 mg        500 mg 100 mL/hr over 60 Minutes Intravenous  Once 01/14/21 2334 01/15/21 0053   01/14/21 2345  vancomycin (VANCOCIN) IVPB 1000 mg/200 mL premix  Status:  Discontinued        1,000 mg 200 mL/hr over 60 Minutes Intravenous  Once 01/14/21 2334 01/14/21 2341   01/14/21 2345  vancomycin (VANCOREADY) IVPB 1500 mg/300 mL        1,500 mg 150 mL/hr over 120 Minutes Intravenous  Once 01/14/21 2341 01/15/21 0237   01/14/21 2330  cefTRIAXone (ROCEPHIN) 1 g in sodium chloride 0.9 % 100 mL IVPB  Status:  Discontinued        1 g 200 mL/hr over 30 Minutes Intravenous  Once 01/14/21 2322 01/14/21 2337       Subjective: Patient seen and examined at bedside.  Poor historian.  No overnight fever, vomiting reported.  Objective: Vitals:   01/19/21 1350 01/19/21 2213 01/20/21 0425 01/20/21 1030  BP: 126/76 135/78 (!) 147/81 130/69  Pulse: 93 91 90   Resp: 18 16 17    Temp: 98.5 F (36.9 C) 99 F (37.2 C) 98.4 F (36.9 C)   TempSrc: Oral Oral Oral   SpO2: 96% 100% 100%   Weight:      Height:        Intake/Output Summary (Last 24 hours) at 01/20/2021 1042 Last data filed at 01/20/2021 0235 Gross per 24 hour  Intake 240 ml  Output 1450 ml  Net -1210 ml   Filed Weights   01/15/21 0037 01/15/21 1731  Weight: 63 kg 66.5 kg    Examination:  General exam: Elderly female lying in bed.  Awake, very poor historian.  Currently on room air.  Slightly confused. Respiratory system: Bilateral decreased breath sounds at bases with some scattered crackles Cardiovascular system: S1 & S2 heard, Rate controlled Gastrointestinal system: Abdomen is nondistended, soft and nontender. Normal bowel sounds heard. Extremities: No cyanosis,  clubbing; trace lower extremity edema   Data Reviewed: I have personally reviewed following labs and imaging studies  CBC: Recent Labs  Lab 01/14/21 2209 01/15/21 0451 01/15/21 0944 01/16/21 0207 01/17/21 0344 01/19/21 0343  WBC 6.8 5.7 5.9 5.8 5.1 5.2  NEUTROABS 5.5 4.7  --   --   --  3.8  HGB 7.4* 6.6* 8.9* 9.8* 9.8* 9.5*  HCT 24.0* 21.1* 27.6* 30.9* 30.7* 30.2*  MCV 98.4 97.7 95.8 95.7 96.2 98.1  PLT 362 320 260 313 316 735   Basic Metabolic Panel: Recent Labs  Lab 01/14/21 2209 01/15/21 0451 01/16/21 0207 01/17/21 0344 01/19/21 0343  NA 133* 135 136 139 139  K 4.6 3.9 4.9 4.2 3.9  CL 100 105 103 107 105  CO2 22 25 28 28 29   GLUCOSE 92 70 220* 87 149*  BUN 38* 29* 21 15 22  CREATININE 1.00 0.61 0.68 0.57 0.56  CALCIUM 8.5* 7.5* 8.2* 8.1* 7.9*  MG  --  1.2*  1.1* 2.0  --   --   PHOS  --  2.4*  2.4* 2.7  --   --    GFR: Estimated Creatinine Clearance: 44.6 mL/min (by C-G formula based on SCr of 0.56 mg/dL). Liver Function Tests: Recent Labs  Lab 01/14/21 2209 01/15/21 0451  AST 16 13*  ALT 9 8  ALKPHOS 87 67  BILITOT 1.2 1.1  PROT 6.0* 4.9*  ALBUMIN 2.4* 1.9*   Recent Labs  Lab 01/14/21 2209  LIPASE 21   No results for input(s): AMMONIA in the last 168 hours. Coagulation Profile: Recent Labs  Lab 01/15/21 0030  INR 1.1   Cardiac Enzymes: Recent Labs  Lab 01/15/21 0451  CKTOTAL 27*   BNP (last 3 results) No results for input(s): PROBNP in the last 8760 hours. HbA1C: No results for input(s): HGBA1C in the last 72 hours. CBG: Recent Labs  Lab 01/19/21 2025 01/20/21 0006 01/20/21 0402 01/20/21 0747 01/20/21 0826  GLUCAP 272* 219* 160* 62* 185*   Lipid Profile: No results for input(s): CHOL, HDL, LDLCALC, TRIG, CHOLHDL, LDLDIRECT in the last 72 hours. Thyroid Function Tests: No results for input(s): TSH, T4TOTAL, FREET4, T3FREE, THYROIDAB in the last 72 hours. Anemia Panel: No results for input(s): VITAMINB12, FOLATE, FERRITIN,  TIBC, IRON, RETICCTPCT in the last 72 hours. Sepsis Labs: Recent Labs  Lab 01/14/21 2153 01/15/21 0033 01/15/21 0451 01/15/21 0717  PROCALCITON  --   --  0.10  --   LATICACIDVEN 4.0* 3.4* 1.7 1.6    Recent Results (from the past 240 hour(s))  Urine Culture     Status: None   Collection Time: 01/14/21  9:52 PM   Specimen: Urine, Clean Catch  Result Value Ref Range Status   Specimen Description   Final    URINE, CLEAN CATCH Performed at [facility], [place] [address] [place], [place] [address]    Special Requests   Final    NONE Performed at [facility], [place] [address], [place], [place] [address]    Culture   Final    NO GROWTH Performed at [facility], [place] [address], [place], [place] [address]    Report Status 01/16/2021 FINAL  Final  Resp Panel by RT-PCR (Flu A&B, Covid) Urine, Bag (ped)     Status: None   Collection Time: 01/14/21  9:54 PM   Specimen: Urine, Bag (ped); Nasopharyngeal(NP) swabs in vial transport medium  Result Value Ref Range Status   SARS Coronavirus 2 by RT PCR NEGATIVE NEGATIVE Final    Comment: (NOTE) SARS-CoV-2 target nucleic acids are NOT DETECTED.  The SARS-CoV-2 RNA is generally detectable in upper respiratory specimens during the acute phase of infection. The lowest concentration of SARS-CoV-2 viral copies this assay can detect is 138 copies/mL. A negative result does not preclude SARS-Cov-2 infection and should not be used as the sole basis for treatment or other patient management decisions. A negative result may occur with  improper specimen collection/handling, submission of specimen other than nasopharyngeal swab, presence of viral mutation(s) within the areas targeted by this assay, and inadequate number of viral copies(<138 copies/mL). A negative result must be combined with clinical observations, patient history, and epidemiological information. The expected result is Negative.  Fact  Sheet for Patients:  EntrepreneurPulse.com.au  Fact Sheet for Healthcare Providers:  IncredibleEmployment.be  This test is no t yet approved  or cleared by the [place] and  has been authorized for detection and/or diagnosis of SARS-CoV-2 by FDA under an Emergency Use Authorization (EUA). This EUA will remain  in effect (meaning this test can be used) for the duration of the COVID-19 declaration under Section 564(b)(1) of the Act, 21 U.S.C.section 360bbb-3(b)(1), unless the authorization is terminated  or revoked sooner.       Influenza A by PCR NEGATIVE NEGATIVE Final   Influenza B by PCR NEGATIVE NEGATIVE Final    Comment: (NOTE) The Xpert Xpress SARS-CoV-2/FLU/RSV plus assay is intended as an aid in the diagnosis of influenza from Nasopharyngeal swab specimens and should not be used as a sole basis for treatment. Nasal washings and aspirates are unacceptable for Xpert Xpress SARS-CoV-2/FLU/RSV testing.  Fact Sheet for Patients: EntrepreneurPulse.com.au  Fact Sheet for Healthcare Providers: IncredibleEmployment.be  This test is not yet approved or cleared by the [place] FDA and has been authorized for detection and/or diagnosis of SARS-CoV-2 by FDA under an Emergency Use Authorization (EUA). This EUA will remain in effect (meaning this test can be used) for the duration of the COVID-19 declaration under Section 564(b)(1) of the Act, 21 U.S.C. section 360bbb-3(b)(1), unless the authorization is terminated or revoked.  Performed at [facility], [place] [address], [place], [place] [address]   Culture, blood (Routine X 2) w Reflex to ID Panel     Status: None   Collection Time: 01/14/21 10:09 PM   Specimen: BLOOD RIGHT FOREARM  Result Value Ref Range Status   Specimen Description   Final    BLOOD RIGHT FOREARM Performed at [facility], [place] [address],  [place], [place] [address]    Special Requests   Final    BOTTLES DRAWN AEROBIC AND ANAEROBIC Blood Culture results may not be optimal due to an inadequate volume of blood received in culture bottles Performed at [place] [address] [place], [place] [address]    Culture   Final    NO GROWTH 5 DAYS Performed at [facility], [place] [address], [place], [place] [address]    Report Status 01/20/2021 FINAL  Final  Culture, blood (Routine X 2) w Reflex to ID Panel     Status: None   Collection Time: 01/14/21 10:09 PM   Specimen: BLOOD RIGHT FOREARM  Result Value Ref Range Status   Specimen Description   Final    BLOOD RIGHT FOREARM Performed at [facility], [place] [address], [place], [place] [address]    Special Requests   Final    BOTTLES DRAWN AEROBIC AND ANAEROBIC Blood Culture results may not be optimal due to an excessive volume of blood received in culture bottles Performed at [place] [address] [place], [place] [address]    Culture   Final    NO GROWTH 5 DAYS Performed at [facility], [place] [address], [place], [place] [address]    Report Status 01/20/2021 FINAL  Final  MRSA PCR Screening     Status: None   Collection Time: 01/15/21  2:35 AM   Specimen: Nasal Mucosa; Nasopharyngeal  Result Value Ref Range Status   MRSA by PCR NEGATIVE NEGATIVE Final    Comment:        The GeneXpert MRSA Assay (FDA approved for NASAL specimens only), is one component of a comprehensive MRSA colonization surveillance program. It is not intended to diagnose MRSA infection nor to guide  or monitor treatment for MRSA infections. Performed at [facility], [place] [address] [place],  [place] [address]          Radiology Studies: No results found.      Scheduled Meds: . amLODipine  5 mg Oral Daily  . aspirin  81 mg Oral q morning  . atorvastatin  40 mg Oral Daily  . Chlorhexidine Gluconate Cloth   6 each Topical Daily  . docusate sodium  100 mg Oral BID  . dorzolamide-timolol  1 drop Both Eyes BID  . DULoxetine  60 mg Oral Daily  . famotidine  20 mg Oral Daily  . feeding supplement  237 mL Oral BID BM  . insulin aspart  0-9 Units Subcutaneous Q4H  . insulin glargine  15 Units Subcutaneous Daily  . latanoprost  1 drop Both Eyes QHS  . levothyroxine  50 mcg Oral Daily  . tamsulosin  0.4 mg Oral Daily   Continuous Infusions: . sodium chloride Stopped (01/14/21 2320)          Aline August, MD [facility] 01/20/2021, 10:42 AM

## 2021-01-20 NOTE — Progress Notes (Signed)
Inpatient Diabetes Program Recommendations  AACE/ADA: New Consensus Statement on Inpatient Glycemic Control (2015)  Target Ranges:  Prepandial:   less than 140 mg/dL      Peak postprandial:   less than 180 mg/dL (1-2 hours)      Critically ill patients:  140 - 180 mg/dL   Lab Results  Component Value Date   GLUCAP 185 (H) 01/20/2021   HGBA1C 6.4 (H) 01/14/2021    Review of Glycemic Control Results for Hayley Fisher, Hayley Fisher (MRN [id number]) as of 01/20/2021 09:52  Ref. Range 01/19/2021 07:58 01/19/2021 12:31 01/19/2021 15:56 01/19/2021 20:25 01/20/2021 00:06 01/20/2021 04:02 01/20/2021 07:47 01/20/2021 08:26  Glucose-Capillary Latest Ref Range: 70 - 99 mg/dL 139 (H) 309 (H) 338 (H) 272 (H) 219 (H) 160 (H) 62 (L) 185 (H)   Diabetes history: DM 2 Outpatient Diabetes medications: Lantus 15 units Daily, Metformin 1000 mg Daily, Rybelsus 3 mg Daily, Humalog 2-15 units tid Current orders for Inpatient glycemic control:  Lantus 15 units Daily Novolog 0-9 units tid  Ensure Enlive bid between meals   Inpatient Diabetes Program Recommendations:    Pending d/c to SNF If pt remains in hospital, may benefit from Novolog meal coverage 3 units tid if eating >50% of meals.  Thanks,  Tama Headings RN, MSN, BC-ADM Inpatient Diabetes Coordinator Team Pager [phone] (8a-5p)

## 2021-01-20 NOTE — Progress Notes (Addendum)
3rd attempt to call report on pt [phone], called 4th time to leave a message for a call back to [phone].  Jerene Pitch

## 2021-01-20 NOTE — Progress Notes (Signed)
As of 7 pm no one has called [phone] for report, oncoming nurse and charge nurse made aware. Hayley Fisher

## 2021-01-20 NOTE — Progress Notes (Addendum)
Called facility 2x to give report on pt, No answer will try again.  Jerene Pitch [phone]

## 2021-01-21 DIAGNOSIS — D649 Anemia, unspecified: Secondary | ICD-10-CM | POA: Diagnosis not present

## 2021-01-21 DIAGNOSIS — Z95828 Presence of other vascular implants and grafts: Secondary | ICD-10-CM | POA: Diagnosis not present

## 2021-01-21 DIAGNOSIS — G9341 Metabolic encephalopathy: Secondary | ICD-10-CM | POA: Diagnosis not present

## 2021-01-21 DIAGNOSIS — E1169 Type 2 diabetes mellitus with other specified complication: Secondary | ICD-10-CM | POA: Diagnosis not present

## 2021-01-21 DIAGNOSIS — E1140 Type 2 diabetes mellitus with diabetic neuropathy, unspecified: Secondary | ICD-10-CM | POA: Diagnosis not present

## 2021-01-21 DIAGNOSIS — M48061 Spinal stenosis, lumbar region without neurogenic claudication: Secondary | ICD-10-CM | POA: Diagnosis not present

## 2021-01-21 DIAGNOSIS — K5900 Constipation, unspecified: Secondary | ICD-10-CM | POA: Diagnosis not present

## 2021-01-21 DIAGNOSIS — K219 Gastro-esophageal reflux disease without esophagitis: Secondary | ICD-10-CM | POA: Diagnosis not present

## 2021-01-21 DIAGNOSIS — Z7409 Other reduced mobility: Secondary | ICD-10-CM | POA: Diagnosis not present

## 2021-01-21 DIAGNOSIS — Z86718 Personal history of other venous thrombosis and embolism: Secondary | ICD-10-CM | POA: Diagnosis not present

## 2021-01-21 DIAGNOSIS — E039 Hypothyroidism, unspecified: Secondary | ICD-10-CM | POA: Diagnosis not present

## 2021-01-21 DIAGNOSIS — R5383 Other fatigue: Secondary | ICD-10-CM | POA: Diagnosis not present

## 2021-01-21 DIAGNOSIS — E440 Moderate protein-calorie malnutrition: Secondary | ICD-10-CM | POA: Diagnosis not present

## 2021-01-21 DIAGNOSIS — Z743 Need for continuous supervision: Secondary | ICD-10-CM | POA: Diagnosis not present

## 2021-01-21 DIAGNOSIS — N390 Urinary tract infection, site not specified: Secondary | ICD-10-CM | POA: Diagnosis not present

## 2021-01-21 DIAGNOSIS — R627 Adult failure to thrive: Secondary | ICD-10-CM | POA: Diagnosis not present

## 2021-01-21 DIAGNOSIS — I152 Hypertension secondary to endocrine disorders: Secondary | ICD-10-CM | POA: Diagnosis not present

## 2021-01-21 DIAGNOSIS — Z515 Encounter for palliative care: Secondary | ICD-10-CM | POA: Diagnosis not present

## 2021-01-21 DIAGNOSIS — R5381 Other malaise: Secondary | ICD-10-CM | POA: Diagnosis not present

## 2021-01-21 DIAGNOSIS — E785 Hyperlipidemia, unspecified: Secondary | ICD-10-CM | POA: Diagnosis not present

## 2021-01-21 DIAGNOSIS — R410 Disorientation, unspecified: Secondary | ICD-10-CM | POA: Diagnosis not present

## 2021-01-21 DIAGNOSIS — R531 Weakness: Secondary | ICD-10-CM | POA: Diagnosis not present

## 2021-01-21 DIAGNOSIS — M6281 Muscle weakness (generalized): Secondary | ICD-10-CM | POA: Diagnosis not present

## 2021-01-21 DIAGNOSIS — I10 Essential (primary) hypertension: Secondary | ICD-10-CM | POA: Diagnosis not present

## 2021-01-21 DIAGNOSIS — H409 Unspecified glaucoma: Secondary | ICD-10-CM | POA: Diagnosis not present

## 2021-01-21 DIAGNOSIS — E1165 Type 2 diabetes mellitus with hyperglycemia: Secondary | ICD-10-CM | POA: Diagnosis not present

## 2021-01-21 DIAGNOSIS — E8809 Other disorders of plasma-protein metabolism, not elsewhere classified: Secondary | ICD-10-CM | POA: Diagnosis not present

## 2021-01-21 DIAGNOSIS — N3281 Overactive bladder: Secondary | ICD-10-CM | POA: Diagnosis not present

## 2021-01-28 ENCOUNTER — Telehealth: Payer: Self-pay

## 2021-01-28 NOTE — Telephone Encounter (Signed)
-----   Message from [facility], DO sent at 01/25/2021  1:40 PM EDT ----- Regarding: FMLA forms Please inform daughter, Cameron Sprang, that I have completed her FMLA forms as requested and have placed them up front to be faxed/picked up by her. She can be reached at [phone] (cell) or [phone] (work). Thank you!!

## 2021-01-28 NOTE — Telephone Encounter (Signed)
FMLA forms faxed and a copy was made for batch scanning. The daughter has been updated.

## 2021-01-29 ENCOUNTER — Non-Acute Institutional Stay: Payer: Medicare Other | Admitting: Hospice

## 2021-01-29 ENCOUNTER — Other Ambulatory Visit: Payer: Self-pay

## 2021-01-29 DIAGNOSIS — F0390 Unspecified dementia without behavioral disturbance: Secondary | ICD-10-CM

## 2021-01-29 DIAGNOSIS — R531 Weakness: Secondary | ICD-10-CM

## 2021-01-29 DIAGNOSIS — Z515 Encounter for palliative care: Secondary | ICD-10-CM

## 2021-01-29 NOTE — Progress Notes (Signed)
[facility] Consult Note Telephone: [phone]  Fax: [phone]  PATIENT NAME: Hayley Fisher DOB: 01/18/1931 MRN: [id number]  PRIMARY CARE PROVIDER:   Danna Hefty, DO [address], [place] [address] [place],  [place] [address]  REFERRING PROVIDER: Martinique Miller NP  RESPONSIBLE PARTY:   Contact Information     Name Relation Home Work Mobile   [name] Daughter [phone]  [phone]   Elba Barman [phone]       Al Corpus - daughter in law- [phone]  Visit is to build trust and highlight Palliative Medicine as specialized medical care for people living with serious illness, aimed at facilitating better quality of life through symptoms relief, assisting with advance care planning and complex medical decision making.  NP called and spoke with Hayley Fisher a and updated her on visit.  RECOMMENDATIONS/PLAN:   Advance Care Planning/Code Status: Patient is a Full code  Goals of Care: Goals of care include to maximize quality of life and symptom management.  Visit consisted of counseling and education dealing with the complex and emotionally intense issues of symptom management and palliative care in the setting of serious and potentially life-threatening illness. Palliative care team will continue to support patient, patient's family, and medical team.  Symptom management/Plan:  Weakness: PT/OT is ongoing for ambulation and strengthening. Provide assistance during meals to ensure adequate oral intake.  Routine CBC BMP Dementia: Continue ongoing supportive care.  Safety/fall precautions. Type 2 diabetes mellitus: Continue metformin as ordered.  A1c every 3 months.  Follow up: Palliative care will continue to follow for complex medical decision making, advance care planning, and clarification of goals. Return 6 weeks or prn. Encouraged to call provider sooner with any concerns.  CHIEF COMPLAINT: Palliative  follow up visit/Weakness  HISTORY OF PRESENT ILLNESS:  Hayley Fisher a 87 y.o. female with multiple medical problems including weakness, acute on chronic, worsening in the past 2 weeks impairing activities of daily living and quality of life weakness is worse when patient tries to do self-care. Ongoing PT OT is helpful.  Hospitalization 5/26-01/20/2021 for weakness related to decreased p.o. intake and UTI for which she was treated with IV Rocephin.  She denies urinary symptoms; no pain/discomfort. History of recurrent UTI hypertension, DVT for which she underwent IVC filter placement by IR on 01/16/2021.  History of dementia, hypothyroidism.  History obtained from review of EMR, discussion with primary team, family and/or patient. Records reviewed and summarized above. All 10 point systems reviewed and are negative except as documented in history of present illness above  Review and summarization of Epic records shows history from other than patient.   Palliative Care was asked to follow this patient o help address complex decision making in the context of advance care planning and goals of care clarification.   PPS: 40%  ROS General: NAD EYES: denies vision changes ENMT: denies dysphagia Cardiovascular: denies chest pain/discomfort Pulmonary: denies cough, denies SOB Abdomen: endorses good appetite, denies constipation/diarrhea GU: denies dysuria, urinary frequency MSK: Endorses weakness,  no falls reported Skin: denies rashes or wounds Neurological: denies pain, denies insomnia Psych: Endorses positive mood Heme/lymph/immuno: denies bruises, abnormal bleeding  PHYSICAL EXAM  General: In no acute distress, appropriately dressed Cardiovascular: regular rate and rhythm; no edema in BLE Pulmonary: no cough, no increased work of breathing, normal respiratory effort Abdomen: soft, non tender, no guarding, positive bowel sounds in all quadrants GU:  no suprapubic tenderness Eyes: Normal lids,  no discharge ENMT: Moist  mucous membranes Musculoskeletal:  weakness Skin: no rash to visible skin, warm without cyanosis,  Psych: non-anxious affect Neurological: Weakness but otherwise non focal, intermittent confusion/memory loss Heme/lymph/immuno: no bruises, no bleeding  PERTINENT MEDICATIONS:  Outpatient Encounter Medications as of 01/29/2021  Medication Sig   ACCU-CHEK AVIVA PLUS test strip USE TO TEST BLOOD SUGAR UP TO 3 TIMES A DAY. (Patient taking differently: 1 each by Other route See admin instructions. USE TO TEST BLOOD SUGAR UP  TO 3 TIMES A DAY.)   Accu-Chek Softclix Lancets lancets USE TO TEST BLOOD SUGAR UP TO 3 TIMES A DAY. (Patient taking differently: 1 each by Other route See admin instructions. USE TO TEST BLOOD SUGAR UP  TO 3 TIMES A DAY.)   aspirin 81 MG chewable tablet Chew 81 mg by mouth every morning.   atorvastatin (LIPITOR) 40 MG tablet Take 1 tablet (40 mg total) by mouth daily.   brimonidine (ALPHAGAN P) 0.1 % SOLN Place 1 drop into both eyes 2 (two) times daily.   carbamide peroxide (DEBROX) 6.5 % OTIC solution Place 2 drops into both ears 2 (two) times daily as needed (ear wax).   Catheter Self-Adhesive Urinary MISC 1 patch by Does not apply route daily.   docusate sodium (COLACE) 100 MG capsule Take 1 capsule (100 mg total) by mouth 2 (two) times daily.   dorzolamide-timolol (COSOPT) 22.3-6.8 MG/ML ophthalmic solution Place 1 drop into both eyes 2 (two) times daily.   DULoxetine (CYMBALTA) 60 MG capsule TAKE 1 CAPSULE BY MOUTH EVERY DAY (Patient taking differently: Take 60 mg by mouth daily.)   enalapril (VASOTEC) 20 MG tablet Take 1 tablet (20 mg total) by mouth daily.   famotidine (PEPCID) 20 MG tablet Take 1 tablet (20 mg total) by mouth daily. If needed can take twice a day.   ferrous sulfate 325 (65 FE) MG tablet Take 325 mg by mouth daily with breakfast.   insulin glargine (LANTUS) 100 UNIT/ML injection Inject 0.15 mLs (15 Units total) into the skin  daily.   insulin lispro (HUMALOG) 100 UNIT/ML injection Inject 2-15 Units into the skin See admin instructions. Per sliding scale, subcutaneous with meals if blood sugar is 70-150 = 0 units  151-200 = 2 units  201-250 = 5 units  251-300 = 8 units  301-350 = 11 units  351-400 = 15 units  >400 = Call MD.   Inject units into skin according to the sliding scale three times daily with meals.   ketoconazole (NIZORAL) 2 % cream Apply 1 application topically daily.   levothyroxine (SYNTHROID) 50 MCG tablet Take 1 tablet (50 mcg total) by mouth daily.   metFORMIN (GLUCOPHAGE) 1000 MG tablet Take 1,000 mg by mouth daily.   mirabegron ER (MYRBETRIQ) 25 MG TB24 tablet Take 1 tablet (25 mg total) by mouth daily.   Multiple Vitamins-Minerals (PRESERVISION AREDS 2+MULTI VIT) CAPS Take 1 capsule by mouth daily.   ondansetron (ZOFRAN ODT) 4 MG disintegrating tablet Take 1 tablet (4 mg total) by mouth every 8 (eight) hours as needed for nausea.   polyethylene glycol powder (GLYCOLAX/MIRALAX) 17 GM/SCOOP powder Take 17 g by mouth daily.   Semaglutide (RYBELSUS) 3 MG TABS Take 3 mg by mouth daily.   SURE COMFORT INSULIN SYRINGE 31G X 5/16" 0.3 ML MISC use 1 syringe with insulin daily (Patient taking differently: Inject 1 Syringe into the skin daily.)   tamsulosin (FLOMAX) 0.4 MG CAPS capsule Take 0.4 mg by mouth daily.   VYZULTA 0.024 % SOLN Place  1 drop into both eyes at bedtime.   witch hazel-glycerin (TUCKS) pad Apply topically as needed for itching.   No facility-administered encounter medications on file as of 01/29/2021.    HOSPICE ELIGIBILITY/DIAGNOSIS: TBD  PAST MEDICAL HISTORY:  Past Medical History:  Diagnosis Date   Arthritis    Arthritis    Cataract    bil cateracts removed   Chronic kidney disease    "spot on one of my kidneys" per pt   Diabetes mellitus    Glaucoma    Hyperkalemia 07/02/2020   Hyperlipidemia    Hyperplastic colon polyp    Hypertension    Hypothyroidism 3/88/8280    Metabolic acidosis 03/49/1791   Syncope    Thyroid disease      Review lab tests/diagnostics Results for REENE, HARLACHER (MRN [id number]) as of 01/29/2021 11:10  Ref. Range 01/19/2021 03:43  Sodium Latest Ref Range: 135 - 145 mmol/L 139  Potassium Latest Ref Range: 3.5 - 5.1 mmol/L 3.9  Chloride Latest Ref Range: 98 - 111 mmol/L 105  CO2 Latest Ref Range: 22 - 32 mmol/L 29  Glucose Latest Ref Range: 70 - 99 mg/dL 149 (H)  BUN Latest Ref Range: 8 - 23 mg/dL 22  Creatinine Latest Ref Range: 0.44 - 1.00 mg/dL 0.56  Calcium Latest Ref Range: 8.9 - 10.3 mg/dL 7.9 (L)  Anion gap Latest Ref Range: 5 - 15  5  GFR, Estimated Latest Ref Range: >60 mL/min >60    ALLERGIES:  Allergies  Allergen Reactions   Levemir [Insulin Detemir] Itching      Thank you for the opportunity to participate in the care of Daniyah Fohl Please call our office at [phone] if we can be of additional assistance.  Note: Portions of this note were generated with [employer]. Dictation errors may occur despite best attempts at proofreading.  Teodoro Spray, NP

## 2021-02-11 DIAGNOSIS — R5383 Other fatigue: Secondary | ICD-10-CM | POA: Diagnosis not present

## 2021-02-11 DIAGNOSIS — E1165 Type 2 diabetes mellitus with hyperglycemia: Secondary | ICD-10-CM | POA: Diagnosis not present

## 2021-02-11 DIAGNOSIS — R410 Disorientation, unspecified: Secondary | ICD-10-CM | POA: Diagnosis not present

## 2021-02-11 DIAGNOSIS — R627 Adult failure to thrive: Secondary | ICD-10-CM | POA: Diagnosis not present

## 2021-02-12 DIAGNOSIS — E039 Hypothyroidism, unspecified: Secondary | ICD-10-CM | POA: Diagnosis not present

## 2021-02-12 DIAGNOSIS — H409 Unspecified glaucoma: Secondary | ICD-10-CM | POA: Diagnosis not present

## 2021-02-12 DIAGNOSIS — R410 Disorientation, unspecified: Secondary | ICD-10-CM | POA: Diagnosis not present

## 2021-02-12 DIAGNOSIS — Z86718 Personal history of other venous thrombosis and embolism: Secondary | ICD-10-CM | POA: Diagnosis not present

## 2021-02-12 DIAGNOSIS — K219 Gastro-esophageal reflux disease without esophagitis: Secondary | ICD-10-CM | POA: Diagnosis not present

## 2021-02-12 DIAGNOSIS — I10 Essential (primary) hypertension: Secondary | ICD-10-CM | POA: Diagnosis not present

## 2021-02-12 DIAGNOSIS — R627 Adult failure to thrive: Secondary | ICD-10-CM | POA: Diagnosis not present

## 2021-02-12 DIAGNOSIS — E440 Moderate protein-calorie malnutrition: Secondary | ICD-10-CM | POA: Diagnosis not present

## 2021-02-12 DIAGNOSIS — M6281 Muscle weakness (generalized): Secondary | ICD-10-CM | POA: Diagnosis not present

## 2021-02-12 DIAGNOSIS — E1165 Type 2 diabetes mellitus with hyperglycemia: Secondary | ICD-10-CM | POA: Diagnosis not present

## 2021-02-15 DIAGNOSIS — M6281 Muscle weakness (generalized): Secondary | ICD-10-CM | POA: Diagnosis not present

## 2021-02-16 DIAGNOSIS — I10 Essential (primary) hypertension: Secondary | ICD-10-CM | POA: Diagnosis not present

## 2021-02-16 DIAGNOSIS — E119 Type 2 diabetes mellitus without complications: Secondary | ICD-10-CM | POA: Diagnosis not present

## 2021-02-16 DIAGNOSIS — R278 Other lack of coordination: Secondary | ICD-10-CM | POA: Diagnosis not present

## 2021-02-16 DIAGNOSIS — G629 Polyneuropathy, unspecified: Secondary | ICD-10-CM | POA: Diagnosis not present

## 2021-02-16 DIAGNOSIS — R627 Adult failure to thrive: Secondary | ICD-10-CM | POA: Diagnosis not present

## 2021-02-16 DIAGNOSIS — M4800 Spinal stenosis, site unspecified: Secondary | ICD-10-CM | POA: Diagnosis not present

## 2021-02-16 DIAGNOSIS — K219 Gastro-esophageal reflux disease without esophagitis: Secondary | ICD-10-CM | POA: Diagnosis not present

## 2021-02-16 DIAGNOSIS — M6281 Muscle weakness (generalized): Secondary | ICD-10-CM | POA: Diagnosis not present

## 2021-02-16 DIAGNOSIS — E550 Rickets, active: Secondary | ICD-10-CM | POA: Diagnosis not present

## 2021-02-17 DIAGNOSIS — N390 Urinary tract infection, site not specified: Secondary | ICD-10-CM | POA: Diagnosis not present

## 2021-02-17 DIAGNOSIS — M2450 Contracture, unspecified joint: Secondary | ICD-10-CM | POA: Diagnosis not present

## 2021-02-18 DIAGNOSIS — E119 Type 2 diabetes mellitus without complications: Secondary | ICD-10-CM | POA: Diagnosis not present

## 2021-02-18 DIAGNOSIS — D649 Anemia, unspecified: Secondary | ICD-10-CM | POA: Diagnosis not present

## 2021-02-18 DIAGNOSIS — E785 Hyperlipidemia, unspecified: Secondary | ICD-10-CM | POA: Diagnosis not present

## 2021-02-18 DIAGNOSIS — E039 Hypothyroidism, unspecified: Secondary | ICD-10-CM | POA: Diagnosis not present

## 2021-02-18 DIAGNOSIS — R278 Other lack of coordination: Secondary | ICD-10-CM | POA: Diagnosis not present

## 2021-02-18 DIAGNOSIS — I10 Essential (primary) hypertension: Secondary | ICD-10-CM | POA: Diagnosis not present

## 2021-02-18 DIAGNOSIS — M6281 Muscle weakness (generalized): Secondary | ICD-10-CM | POA: Diagnosis not present

## 2021-02-20 ENCOUNTER — Emergency Department (HOSPITAL_COMMUNITY): Payer: Medicare Other

## 2021-02-20 ENCOUNTER — Other Ambulatory Visit: Payer: Self-pay

## 2021-02-20 ENCOUNTER — Encounter (HOSPITAL_COMMUNITY): Payer: Self-pay

## 2021-02-20 ENCOUNTER — Inpatient Hospital Stay (HOSPITAL_COMMUNITY)
Admission: EM | Admit: 2021-02-20 | Discharge: 2021-02-26 | DRG: 871 | Disposition: A | Discharge status: Home / Self Care | Payer: Medicare Other | Attending: Internal Medicine | Admitting: Internal Medicine

## 2021-02-20 DIAGNOSIS — I152 Hypertension secondary to endocrine disorders: Secondary | ICD-10-CM | POA: Diagnosis not present

## 2021-02-20 DIAGNOSIS — R410 Disorientation, unspecified: Secondary | ICD-10-CM | POA: Diagnosis not present

## 2021-02-20 DIAGNOSIS — Z888 Allergy status to other drugs, medicaments and biological substances status: Secondary | ICD-10-CM | POA: Diagnosis not present

## 2021-02-20 DIAGNOSIS — E860 Dehydration: Secondary | ICD-10-CM

## 2021-02-20 DIAGNOSIS — K219 Gastro-esophageal reflux disease without esophagitis: Secondary | ICD-10-CM | POA: Diagnosis not present

## 2021-02-20 DIAGNOSIS — E440 Moderate protein-calorie malnutrition: Secondary | ICD-10-CM | POA: Insufficient documentation

## 2021-02-20 DIAGNOSIS — A4159 Other Gram-negative sepsis: Principal | ICD-10-CM | POA: Diagnosis present

## 2021-02-20 DIAGNOSIS — R4189 Other symptoms and signs involving cognitive functions and awareness: Secondary | ICD-10-CM

## 2021-02-20 DIAGNOSIS — E1159 Type 2 diabetes mellitus with other circulatory complications: Secondary | ICD-10-CM | POA: Diagnosis present

## 2021-02-20 DIAGNOSIS — I10 Essential (primary) hypertension: Secondary | ICD-10-CM | POA: Diagnosis not present

## 2021-02-20 DIAGNOSIS — Z743 Need for continuous supervision: Secondary | ICD-10-CM | POA: Diagnosis not present

## 2021-02-20 DIAGNOSIS — E1165 Type 2 diabetes mellitus with hyperglycemia: Secondary | ICD-10-CM | POA: Diagnosis present

## 2021-02-20 DIAGNOSIS — Z95828 Presence of other vascular implants and grafts: Secondary | ICD-10-CM

## 2021-02-20 DIAGNOSIS — G9341 Metabolic encephalopathy: Secondary | ICD-10-CM | POA: Diagnosis not present

## 2021-02-20 DIAGNOSIS — Z7984 Long term (current) use of oral hypoglycemic drugs: Secondary | ICD-10-CM | POA: Diagnosis not present

## 2021-02-20 DIAGNOSIS — N390 Urinary tract infection, site not specified: Secondary | ICD-10-CM | POA: Diagnosis present

## 2021-02-20 DIAGNOSIS — R6520 Severe sepsis without septic shock: Secondary | ICD-10-CM | POA: Diagnosis present

## 2021-02-20 DIAGNOSIS — Z86718 Personal history of other venous thrombosis and embolism: Secondary | ICD-10-CM | POA: Diagnosis not present

## 2021-02-20 DIAGNOSIS — E785 Hyperlipidemia, unspecified: Secondary | ICD-10-CM | POA: Diagnosis not present

## 2021-02-20 DIAGNOSIS — Z87891 Personal history of nicotine dependence: Secondary | ICD-10-CM | POA: Diagnosis not present

## 2021-02-20 DIAGNOSIS — Z794 Long term (current) use of insulin: Secondary | ICD-10-CM

## 2021-02-20 DIAGNOSIS — E039 Hypothyroidism, unspecified: Secondary | ICD-10-CM | POA: Diagnosis present

## 2021-02-20 DIAGNOSIS — Z7982 Long term (current) use of aspirin: Secondary | ICD-10-CM

## 2021-02-20 DIAGNOSIS — Z79899 Other long term (current) drug therapy: Secondary | ICD-10-CM | POA: Diagnosis not present

## 2021-02-20 DIAGNOSIS — R4182 Altered mental status, unspecified: Secondary | ICD-10-CM | POA: Diagnosis present

## 2021-02-20 DIAGNOSIS — R000 Tachycardia, unspecified: Secondary | ICD-10-CM | POA: Diagnosis not present

## 2021-02-20 DIAGNOSIS — E1169 Type 2 diabetes mellitus with other specified complication: Secondary | ICD-10-CM | POA: Diagnosis present

## 2021-02-20 DIAGNOSIS — Z20822 Contact with and (suspected) exposure to covid-19: Secondary | ICD-10-CM | POA: Diagnosis present

## 2021-02-20 DIAGNOSIS — Z803 Family history of malignant neoplasm of breast: Secondary | ICD-10-CM

## 2021-02-20 DIAGNOSIS — A419 Sepsis, unspecified organism: Secondary | ICD-10-CM | POA: Diagnosis not present

## 2021-02-20 DIAGNOSIS — Z6824 Body mass index (BMI) 24.0-24.9, adult: Secondary | ICD-10-CM

## 2021-02-20 DIAGNOSIS — E1140 Type 2 diabetes mellitus with diabetic neuropathy, unspecified: Secondary | ICD-10-CM

## 2021-02-20 DIAGNOSIS — R001 Bradycardia, unspecified: Secondary | ICD-10-CM | POA: Diagnosis not present

## 2021-02-20 DIAGNOSIS — F0390 Unspecified dementia without behavioral disturbance: Secondary | ICD-10-CM

## 2021-02-20 DIAGNOSIS — R2681 Unsteadiness on feet: Secondary | ICD-10-CM

## 2021-02-20 DIAGNOSIS — R0682 Tachypnea, not elsewhere classified: Secondary | ICD-10-CM | POA: Diagnosis not present

## 2021-02-20 DIAGNOSIS — Z800 Family history of malignant neoplasm of digestive organs: Secondary | ICD-10-CM

## 2021-02-20 DIAGNOSIS — F03A0 Unspecified dementia, mild, without behavioral disturbance, psychotic disturbance, mood disturbance, and anxiety: Secondary | ICD-10-CM

## 2021-02-20 DIAGNOSIS — F03C18 Unspecified dementia, severe, with other behavioral disturbance: Secondary | ICD-10-CM

## 2021-02-20 DIAGNOSIS — Z833 Family history of diabetes mellitus: Secondary | ICD-10-CM

## 2021-02-20 DIAGNOSIS — I499 Cardiac arrhythmia, unspecified: Secondary | ICD-10-CM | POA: Diagnosis not present

## 2021-02-20 LAB — COMPREHENSIVE METABOLIC PANEL
ALT: 13 U/L (ref 0–44)
AST: 14 U/L — ABNORMAL LOW (ref 15–41)
Albumin: 2.5 g/dL — ABNORMAL LOW (ref 3.5–5.0)
Alkaline Phosphatase: 66 U/L (ref 38–126)
Anion gap: 10 (ref 5–15)
BUN: 23 mg/dL (ref 8–23)
CO2: 26 mmol/L (ref 22–32)
Calcium: 8.2 mg/dL — ABNORMAL LOW (ref 8.9–10.3)
Chloride: 95 mmol/L — ABNORMAL LOW (ref 98–111)
Creatinine, Ser: 0.73 mg/dL (ref 0.44–1.00)
GFR, Estimated: >60 mL/min (ref >60)
Glucose, Bld: 167 mg/dL — ABNORMAL HIGH (ref 70–99)
Potassium: 4 mmol/L (ref 3.5–5.1)
Sodium: 131 mmol/L — ABNORMAL LOW (ref 135–145)
Total Bilirubin: 0.5 mg/dL (ref 0.3–1.2)
Total Protein: 6.2 g/dL — ABNORMAL LOW (ref 6.5–8.1)

## 2021-02-20 LAB — URINALYSIS, ROUTINE W REFLEX MICROSCOPIC
Bilirubin Urine: NEGATIVE
Glucose, UA: 150 mg/dL — AB
Ketones, ur: NEGATIVE mg/dL
Nitrite: NEGATIVE
Protein, ur: 100 mg/dL — AB
RBC / HPF: >50 RBC/hpf — ABNORMAL HIGH (ref 0–5)
Specific Gravity, Urine: 1.012 (ref 1.005–1.030)
WBC, UA: >50 WBC/hpf — ABNORMAL HIGH (ref 0–5)
pH: 5 (ref 5.0–8.0)

## 2021-02-20 LAB — APTT: aPTT: 32 seconds (ref 24–36)

## 2021-02-20 LAB — LACTIC ACID, PLASMA: Lactic Acid, Venous: 2.3 mmol/L (ref 0.5–1.9)

## 2021-02-20 LAB — PROTIME-INR
INR: 1.1 (ref 0.8–1.2)
Prothrombin Time: 13.7 seconds (ref 11.4–15.2)

## 2021-02-20 LAB — CBG MONITORING, ED: Glucose-Capillary: 175 mg/dL — ABNORMAL HIGH (ref 70–99)

## 2021-02-20 MED ORDER — SODIUM CHLORIDE 0.9 % IV SOLN
1.0000 g | Freq: Once | INTRAVENOUS | Status: AC
  Administered 2021-02-20: 1 g via INTRAVENOUS
  Filled 2021-02-20: qty 10

## 2021-02-20 MED ORDER — LACTATED RINGERS IV BOLUS
1000.0000 mL | Freq: Once | INTRAVENOUS | Status: AC
  Administered 2021-02-20: 1000 mL via INTRAVENOUS

## 2021-02-20 MED ORDER — ACETAMINOPHEN 325 MG PO TABS
650.0000 mg | ORAL_TABLET | Freq: Once | ORAL | Status: AC
  Administered 2021-02-20: 650 mg via ORAL
  Filled 2021-02-20: qty 2

## 2021-02-20 NOTE — ED Provider Notes (Signed)
[facility] DEPT Provider Note   CSN: [id number] Arrival date & time: 02/20/21  2049  LEVEL 5 CAVEAT - ALTERED MENTAL STATUS   History Chief Complaint  Patient presents with   Urinary Frequency   Altered Mental Status    Hayley Fisher is a 87 y.o. female.  HPI 87 year old female presents from home by EMS.  She has been having progressively worsening altered mental status.  Diagnosed with a UTI but no antibiotics have been given since being at an assisted living facility.  She has been having progressive confusion similar to prior UTIs.  The patient has been getting UTIs fairly frequently recently.  Tonight when she came home from assisted living she did not recognize a family member.  Past Medical History:  Diagnosis Date   Arthritis    Arthritis    Cataract    bil cateracts removed   Chronic kidney disease    "spot on one of my kidneys" per pt   Diabetes mellitus    Glaucoma    Hyperkalemia 07/02/2020   Hyperlipidemia    Hyperplastic colon polyp    Hypertension    Hypothyroidism 2/95/1884   Metabolic acidosis 16/60/6301   Syncope    Thyroid disease     Patient Active Problem List   Diagnosis Date Noted   Sepsis ([place]) 01/15/2021   Acute encephalopathy 12/09/2020   Hyperbilirubinemia 12/09/2020   Hypothyroidism 12/09/2020   Memory changes 10/20/2020   Acute on chronic anemia 09/21/2020   Gastroesophageal reflux disease without esophagitis 09/16/2020   Urinary frequency 09/11/2020   Stage 1 decubitus ulcer 07/30/2020   Uncontrolled type 2 diabetes mellitus with hyperglycemia ([place]) 07/02/2020   Unstable gait 04/03/2018   Frequent falls 04/03/2018   Age-related physical debility 03/22/2018   Skin ulcer of sacrum, limited to breakdown of skin ([place]) 03/16/2017   Spinal stenosis of lumbar region 01/13/2016   Chronic venous insufficiency 11/09/2015   DDD (degenerative disc disease), lumbosacral 11/24/2014   Constipation by delayed  colonic transit 11/24/2014   Frequent UTI 10/29/2012   Type 2 diabetes mellitus with diabetic neuropathy ([place]) 10/19/2006   Hyperlipidemia associated with type 2 diabetes mellitus ([place]) 10/19/2006   Hypertension associated with diabetes ([place]) 10/19/2006   Osteopenia 10/19/2006    Past Surgical History:  Procedure Laterality Date   ABDOMINAL HYSTERECTOMY     BLADDER SUSPENSION     bladder tacking     COLONOSCOPY     EYE SURGERY     INGUINAL HERNIA REPAIR Right    IR IVC FILTER PLMT / S&I /IMG GUID/MOD SED  01/16/2021     OB History   No obstetric history on file.     Family History  Problem Relation Age of Onset   Colon cancer Mother    Other Father        cerebral hemorrhage   Diabetes Brother    Diabetes Sister        x 2   Bone cancer Daughter    Breast cancer Daughter 24   Esophageal cancer Neg Hx    Rectal cancer Neg Hx    Stomach cancer Neg Hx     Social History   Tobacco Use   Smoking status: Former    Pack years: 0.00    Types: Cigarettes    Quit date: 11/29/1980    Years since quitting: 40.2   Smokeless tobacco: Never  Vaping Use   Vaping Use: Never used  Substance Use Topics   Alcohol use: No  Alcohol/week: 0.0 standard drinks   Drug use: No    Home Medications Prior to Admission medications   Medication Sig Start Date End Date Taking? Authorizing Provider  ACCU-CHEK AVIVA PLUS test strip USE TO TEST BLOOD SUGAR UP TO 3 TIMES A DAY. Patient taking differently: 1 each by Other route See admin instructions. USE TO TEST BLOOD SUGAR UP  TO 3 TIMES A DAY. 06/30/20   [place], Kiersten P, DO  Accu-Chek Softclix Lancets lancets USE TO TEST BLOOD SUGAR UP TO 3 TIMES A DAY. Patient taking differently: 1 each by Other route See admin instructions. USE TO TEST BLOOD SUGAR UP  TO 3 TIMES A DAY. 06/30/20   [place], Kiersten P, DO  aspirin 81 MG chewable tablet Chew 81 mg by mouth every morning.    [provider]  atorvastatin (LIPITOR) 40 MG tablet  Take 1 tablet (40 mg total) by mouth daily. 09/21/20   [place], Kiersten P, DO  brimonidine (ALPHAGAN P) 0.1 % SOLN Place 1 drop into both eyes 2 (two) times daily. 09/20/11     carbamide peroxide (DEBROX) 6.5 % OTIC solution Place 2 drops into both ears 2 (two) times daily as needed (ear wax). 01/20/21   Aline August, MD  Catheter Self-Adhesive Urinary MISC 1 patch by Does not apply route daily. 10/26/20   Anderson, Chelsey L, DO  docusate sodium (COLACE) 100 MG capsule Take 1 capsule (100 mg total) by mouth 2 (two) times daily. 12/15/20   Harold Hedge, MD  dorzolamide-timolol (COSOPT) 22.3-6.8 MG/ML ophthalmic solution Place 1 drop into both eyes 2 (two) times daily. 05/18/20   [provider]  DULoxetine (CYMBALTA) 60 MG capsule TAKE 1 CAPSULE BY MOUTH EVERY DAY Patient taking differently: Take 60 mg by mouth daily. 10/22/18   Harriet Butte, DO  enalapril (VASOTEC) 20 MG tablet Take 1 tablet (20 mg total) by mouth daily. 09/21/20   M[place], Kiersten P, DO  famotidine (PEPCID) 20 MG tablet Take 1 tablet (20 mg total) by mouth daily. If needed can take twice a day. 09/16/20   [place], Kiersten P, DO  ferrous sulfate 325 (65 FE) MG tablet Take 325 mg by mouth daily with breakfast.    [provider]  insulin glargine (LANTUS) 100 UNIT/ML injection Inject 0.15 mLs (15 Units total) into the skin daily. 12/15/20   Harold Hedge, MD  insulin lispro (HUMALOG) 100 UNIT/ML injection Inject 2-15 Units into the skin See admin instructions. Per sliding scale, subcutaneous with meals if blood sugar is 70-150 = 0 units  151-200 = 2 units  201-250 = 5 units  251-300 = 8 units  301-350 = 11 units  351-400 = 15 units  >400 = Call MD.   Inject units into skin according to the sliding scale three times daily with meals.    [provider]  ketoconazole (NIZORAL) 2 % cream Apply 1 application topically daily. 11/19/20   Criselda Peaches, DPM  levothyroxine (SYNTHROID) 50 MCG tablet Take 1 tablet  (50 mcg total) by mouth daily. 11/10/20   Renato Shin, MD  metFORMIN (GLUCOPHAGE) 1000 MG tablet Take 1,000 mg by mouth daily. 01/11/21   [provider]  mirabegron ER (MYRBETRIQ) 25 MG TB24 tablet Take 1 tablet (25 mg total) by mouth daily. 09/16/20   M[place]is, Kiersten P, DO  Multiple Vitamins-Minerals (PRESERVISION AREDS 2+MULTI VIT) CAPS Take 1 capsule by mouth daily.    [provider]  ondansetron (ZOFRAN ODT) 4 MG disintegrating tablet Take  1 tablet (4 mg total) by mouth every 8 (eight) hours as needed for nausea. 07/28/20   Larene Pickett, PA-C  polyethylene glycol powder (GLYCOLAX/MIRALAX) 17 GM/SCOOP powder Take 17 g by mouth daily. 12/15/20   Harold Hedge, MD  Semaglutide (RYBELSUS) 3 MG TABS Take 3 mg by mouth daily. 11/09/20   Renato Shin, MD  SURE COMFORT INSULIN SYRINGE 31G X 5/16" 0.3 ML MISC use 1 syringe with insulin daily Patient taking differently: Inject 1 Syringe into the skin daily. 11/12/20   M[place]is, Kiersten P, DO  tamsulosin (FLOMAX) 0.4 MG CAPS capsule Take 0.4 mg by mouth daily. 10/08/20   [provider]  VYZULTA 0.024 % SOLN Place 1 drop into both eyes at bedtime. 11/06/17   [provider]  witch hazel-glycerin (TUCKS) pad Apply topically as needed for itching. 12/15/20   Harold Hedge, MD    Allergies    Levemir [insulin detemir]  Review of Systems   Review of Systems  Unable to perform ROS: Mental status change   Physical Exam Updated Vital Signs BP 115/64   Pulse (!) 108   Temp 100 F (37.8 C) (Rectal)   Resp 16   Ht 5\' 5"  (1.651 m)   Wt 65.7 kg   SpO2 97%   BMI 24.10 kg/m   Physical Exam Vitals and nursing note reviewed.  Constitutional:      Appearance: She is well-developed.  HENT:     Head: Normocephalic and atraumatic.     Right Ear: External ear normal.     Left Ear: External ear normal.     Nose: Nose normal.  Eyes:     General:        Right eye: No discharge.        Left eye: No discharge.   Cardiovascular:     Rate and Rhythm: Regular rhythm. Tachycardia present.     Heart sounds: Normal heart sounds.  Pulmonary:     Effort: Pulmonary effort is normal.     Breath sounds: Normal breath sounds.  Abdominal:     Palpations: Abdomen is soft.     Tenderness: There is no abdominal tenderness.  Musculoskeletal:     Cervical back: Neck supple. No rigidity.  Skin:    General: Skin is warm and dry.  Neurological:     Mental Status: She is alert. She is disoriented.     Comments: Symmetric movement of all 4 extremities to command  Psychiatric:        Mood and Affect: Mood is not anxious.    ED Results / Procedures / Treatments   Labs (all labs ordered are listed, but only abnormal results are displayed) Labs Reviewed  URINALYSIS, ROUTINE W REFLEX MICROSCOPIC - Abnormal; Notable for the following components:      Result Value   APPearance TURBID (*)    Glucose, UA 150 (*)    Hgb urine dipstick LARGE (*)    Protein, ur 100 (*)    Leukocytes,Ua LARGE (*)    RBC / HPF >50 (*)    WBC, UA >50 (*)    Bacteria, UA MANY (*)    All other components within normal limits  CBG MONITORING, ED - Abnormal; Notable for the following components:   Glucose-Capillary 175 (*)    All other components within normal limits  URINE CULTURE  CULTURE, BLOOD (ROUTINE X 2)  CULTURE, BLOOD (ROUTINE X 2)  RESP PANEL BY RT-PCR (FLU A&B, COVID) ARPGX2  LACTIC ACID, PLASMA  LACTIC ACID, PLASMA  COMPREHENSIVE METABOLIC PANEL  CBC WITH DIFFERENTIAL/PLATELET  PROTIME-INR  APTT    EKG EKG Interpretation  Date/Time:  Saturday February 20 2021 22:33:52 EDT Ventricular Rate:  109 PR Interval:  149 QRS Duration: 82 QT Interval:  328 QTC Calculation: 442 R Axis:   88 Text Interpretation: Sinus tachycardia Atrial premature complex Probable left atrial enlargement Borderline right axis deviation Consider anterior infarct Confirmed by Sherwood Gambler [phone]) on 02/20/2021 10:45:36 PM  Radiology DG  Chest Port 1 View  Result Date: 02/20/2021 CLINICAL DATA:  Recent diagnosis of urinary tract infection. Intermittent confusion. History of diabetes and hypertension. EXAM: PORTABLE CHEST 1 VIEW COMPARISON:  01/14/2021 FINDINGS: Shallow inspiration with infiltration or atelectasis in the lung bases, greater on the left. Normal heart size and pulmonary vascularity. No pleural effusions. No pneumothorax. Mediastinal contours appear intact. IMPRESSION: Shallow inspiration with infiltration or atelectasis in the lung bases, greater on the left. Electronically Signed   By: Lucienne Capers M.D.   On: 02/20/2021 22:02    Procedures Procedures   Medications Ordered in ED Medications  lactated ringers bolus 1,000 mL (has no administration in time range)  lactated ringers bolus 1,000 mL (1,000 mLs Intravenous New Bag/Given 02/20/21 2244)  cefTRIAXone (ROCEPHIN) 1 g in sodium chloride 0.9 % 100 mL IVPB (1 g Intravenous New Bag/Given 02/20/21 2245)  acetaminophen (TYLENOL) tablet 650 mg (650 mg Oral Given 02/20/21 2253)    ED Course  I have reviewed the triage vital signs and the nursing notes.  Pertinent labs & imaging results that were available during my care of the patient were reviewed by me and considered in my medical decision making (see chart for details).    MDM Rules/Calculators/A&P                          Patient's presentation is most consistent with encephalopathy from urinary tract infection.  Low-grade temperature with tachycardia and borderline blood pressure though no hypotension.  She will be given IV fluids, IV Rocephin, and will need admission.  Currently labs including lactate are pending, to help determine disposition floor.  Care to Dr. Christy Gentles. Final Clinical Impression(s) / ED Diagnoses Final diagnoses:  None    Rx / DC Orders ED Discharge Orders     None        Sherwood Gambler, MD 02/20/21 2318

## 2021-02-20 NOTE — ED Notes (Signed)
Pt unable to sign MSE due to AMS.  

## 2021-02-20 NOTE — ED Triage Notes (Signed)
BIB EMS from home. Pt recently at [facility] and diagnosed with UTI. Pt was being treated with cranberry extract, no abx. Pt was having some intermittent confusion at home.

## 2021-02-21 ENCOUNTER — Encounter (HOSPITAL_COMMUNITY): Payer: Self-pay | Admitting: Family Medicine

## 2021-02-21 DIAGNOSIS — A419 Sepsis, unspecified organism: Secondary | ICD-10-CM

## 2021-02-21 DIAGNOSIS — E785 Hyperlipidemia, unspecified: Secondary | ICD-10-CM

## 2021-02-21 DIAGNOSIS — E1165 Type 2 diabetes mellitus with hyperglycemia: Secondary | ICD-10-CM | POA: Diagnosis present

## 2021-02-21 DIAGNOSIS — R6520 Severe sepsis without septic shock: Secondary | ICD-10-CM | POA: Diagnosis present

## 2021-02-21 DIAGNOSIS — G9341 Metabolic encephalopathy: Secondary | ICD-10-CM | POA: Diagnosis not present

## 2021-02-21 DIAGNOSIS — I152 Hypertension secondary to endocrine disorders: Secondary | ICD-10-CM

## 2021-02-21 DIAGNOSIS — E039 Hypothyroidism, unspecified: Secondary | ICD-10-CM | POA: Diagnosis not present

## 2021-02-21 DIAGNOSIS — F03C18 Unspecified dementia, severe, with other behavioral disturbance: Secondary | ICD-10-CM

## 2021-02-21 DIAGNOSIS — Z86718 Personal history of other venous thrombosis and embolism: Secondary | ICD-10-CM | POA: Diagnosis not present

## 2021-02-21 DIAGNOSIS — R4189 Other symptoms and signs involving cognitive functions and awareness: Secondary | ICD-10-CM

## 2021-02-21 DIAGNOSIS — Z833 Family history of diabetes mellitus: Secondary | ICD-10-CM | POA: Diagnosis not present

## 2021-02-21 DIAGNOSIS — A4159 Other Gram-negative sepsis: Secondary | ICD-10-CM | POA: Diagnosis present

## 2021-02-21 DIAGNOSIS — K219 Gastro-esophageal reflux disease without esophagitis: Secondary | ICD-10-CM | POA: Diagnosis present

## 2021-02-21 DIAGNOSIS — E1159 Type 2 diabetes mellitus with other circulatory complications: Secondary | ICD-10-CM | POA: Diagnosis not present

## 2021-02-21 DIAGNOSIS — N390 Urinary tract infection, site not specified: Secondary | ICD-10-CM | POA: Diagnosis present

## 2021-02-21 DIAGNOSIS — Z79899 Other long term (current) drug therapy: Secondary | ICD-10-CM | POA: Diagnosis not present

## 2021-02-21 DIAGNOSIS — Z7982 Long term (current) use of aspirin: Secondary | ICD-10-CM | POA: Diagnosis not present

## 2021-02-21 DIAGNOSIS — Z803 Family history of malignant neoplasm of breast: Secondary | ICD-10-CM | POA: Diagnosis not present

## 2021-02-21 DIAGNOSIS — E1169 Type 2 diabetes mellitus with other specified complication: Secondary | ICD-10-CM

## 2021-02-21 DIAGNOSIS — Z87891 Personal history of nicotine dependence: Secondary | ICD-10-CM | POA: Diagnosis not present

## 2021-02-21 DIAGNOSIS — Z888 Allergy status to other drugs, medicaments and biological substances status: Secondary | ICD-10-CM | POA: Diagnosis not present

## 2021-02-21 DIAGNOSIS — Z7984 Long term (current) use of oral hypoglycemic drugs: Secondary | ICD-10-CM | POA: Diagnosis not present

## 2021-02-21 DIAGNOSIS — F03A0 Unspecified dementia, mild, without behavioral disturbance, psychotic disturbance, mood disturbance, and anxiety: Secondary | ICD-10-CM

## 2021-02-21 DIAGNOSIS — F0390 Unspecified dementia without behavioral disturbance: Secondary | ICD-10-CM | POA: Diagnosis present

## 2021-02-21 DIAGNOSIS — Z794 Long term (current) use of insulin: Secondary | ICD-10-CM | POA: Diagnosis not present

## 2021-02-21 DIAGNOSIS — R4182 Altered mental status, unspecified: Secondary | ICD-10-CM | POA: Diagnosis present

## 2021-02-21 DIAGNOSIS — Z95828 Presence of other vascular implants and grafts: Secondary | ICD-10-CM | POA: Diagnosis not present

## 2021-02-21 DIAGNOSIS — Z20822 Contact with and (suspected) exposure to covid-19: Secondary | ICD-10-CM | POA: Diagnosis present

## 2021-02-21 DIAGNOSIS — Z800 Family history of malignant neoplasm of digestive organs: Secondary | ICD-10-CM | POA: Diagnosis not present

## 2021-02-21 DIAGNOSIS — E860 Dehydration: Secondary | ICD-10-CM | POA: Diagnosis present

## 2021-02-21 DIAGNOSIS — E440 Moderate protein-calorie malnutrition: Secondary | ICD-10-CM | POA: Diagnosis present

## 2021-02-21 LAB — CBC WITH DIFFERENTIAL/PLATELET
Abs Immature Granulocytes: 0.03 10*3/uL (ref 0.00–0.07)
Basophils Absolute: 0 10*3/uL (ref 0.0–0.1)
Basophils Relative: 0 %
Eosinophils Absolute: 0 10*3/uL (ref 0.0–0.5)
Eosinophils Relative: 1 %
HCT: 35.4 % — ABNORMAL LOW (ref 36.0–46.0)
Hemoglobin: 11.6 g/dL — ABNORMAL LOW (ref 12.0–15.0)
Immature Granulocytes: 1 %
Lymphocytes Relative: 15 %
Lymphs Abs: 0.8 10*3/uL (ref 0.7–4.0)
MCH: 31.2 pg (ref 26.0–34.0)
MCHC: 32.8 g/dL (ref 30.0–36.0)
MCV: 95.2 fL (ref 80.0–100.0)
Monocytes Absolute: 0.6 10*3/uL (ref 0.1–1.0)
Monocytes Relative: 11 %
Neutro Abs: 3.7 10*3/uL (ref 1.7–7.7)
Neutrophils Relative %: 72 %
Platelets: 270 10*3/uL (ref 150–400)
RBC: 3.72 MIL/uL — ABNORMAL LOW (ref 3.87–5.11)
RDW: 14.6 % (ref 11.5–15.5)
WBC: 5.1 10*3/uL (ref 4.0–10.5)
nRBC: 0 % (ref 0.0–0.2)

## 2021-02-21 LAB — CBC
HCT: 34.7 % — ABNORMAL LOW (ref 36.0–46.0)
Hemoglobin: 11.2 g/dL — ABNORMAL LOW (ref 12.0–15.0)
MCH: 30.8 pg (ref 26.0–34.0)
MCHC: 32.3 g/dL (ref 30.0–36.0)
MCV: 95.3 fL (ref 80.0–100.0)
Platelets: 276 10*3/uL (ref 150–400)
RBC: 3.64 MIL/uL — ABNORMAL LOW (ref 3.87–5.11)
RDW: 14.8 % (ref 11.5–15.5)
WBC: 4.7 10*3/uL (ref 4.0–10.5)
nRBC: 0 % (ref 0.0–0.2)

## 2021-02-21 LAB — GLUCOSE, CAPILLARY
Glucose-Capillary: 154 mg/dL — ABNORMAL HIGH (ref 70–99)
Glucose-Capillary: 176 mg/dL — ABNORMAL HIGH (ref 70–99)
Glucose-Capillary: 179 mg/dL — ABNORMAL HIGH (ref 70–99)
Glucose-Capillary: 328 mg/dL — ABNORMAL HIGH (ref 70–99)
Glucose-Capillary: 223 mg/dL — ABNORMAL HIGH (ref 70–99)

## 2021-02-21 LAB — BASIC METABOLIC PANEL
Anion gap: 10 (ref 5–15)
BUN: 19 mg/dL (ref 8–23)
CO2: 26 mmol/L (ref 22–32)
Calcium: 8.2 mg/dL — ABNORMAL LOW (ref 8.9–10.3)
Chloride: 97 mmol/L — ABNORMAL LOW (ref 98–111)
Creatinine, Ser: 0.78 mg/dL (ref 0.44–1.00)
GFR, Estimated: >60 mL/min (ref >60)
Glucose, Bld: 170 mg/dL — ABNORMAL HIGH (ref 70–99)
Potassium: 3.8 mmol/L (ref 3.5–5.1)
Sodium: 133 mmol/L — ABNORMAL LOW (ref 135–145)

## 2021-02-21 LAB — RESP PANEL BY RT-PCR (FLU A&B, COVID) ARPGX2
Influenza A by PCR: NEGATIVE
Influenza B by PCR: NEGATIVE
SARS Coronavirus 2 by RT PCR: NEGATIVE

## 2021-02-21 LAB — LACTIC ACID, PLASMA
Lactic Acid, Venous: 1.7 mmol/L (ref 0.5–1.9)
Lactic Acid, Venous: 1 mmol/L (ref 0.5–1.9)

## 2021-02-21 MED ORDER — ENALAPRIL MALEATE 10 MG PO TABS
20.0000 mg | ORAL_TABLET | Freq: Every day | ORAL | Status: DC
  Administered 2021-02-21 – 2021-02-26 (×6): 20 mg via ORAL
  Filled 2021-02-21 (×6): qty 2

## 2021-02-21 MED ORDER — SODIUM CHLORIDE 0.9 % IV SOLN
1.0000 g | INTRAVENOUS | Status: DC
  Administered 2021-02-21 – 2021-02-23 (×2): 1 g via INTRAVENOUS
  Filled 2021-02-21 (×2): qty 1

## 2021-02-21 MED ORDER — ACETAMINOPHEN 325 MG PO TABS
650.0000 mg | ORAL_TABLET | Freq: Four times a day (QID) | ORAL | Status: DC | PRN
  Administered 2021-02-21 – 2021-02-26 (×10): 650 mg via ORAL
  Filled 2021-02-21 (×10): qty 2

## 2021-02-21 MED ORDER — LEVOTHYROXINE SODIUM 50 MCG PO TABS
50.0000 ug | ORAL_TABLET | Freq: Every day | ORAL | Status: DC
  Administered 2021-02-21 – 2021-02-26 (×6): 50 ug via ORAL
  Filled 2021-02-21 (×6): qty 1

## 2021-02-21 MED ORDER — ENSURE ENLIVE PO LIQD
237.0000 mL | Freq: Two times a day (BID) | ORAL | Status: DC
  Administered 2021-02-21 – 2021-02-23 (×4): 237 mL via ORAL

## 2021-02-21 MED ORDER — ENOXAPARIN SODIUM 40 MG/0.4ML IJ SOSY
40.0000 mg | PREFILLED_SYRINGE | INTRAMUSCULAR | Status: DC
  Administered 2021-02-21 – 2021-02-26 (×6): 40 mg via SUBCUTANEOUS
  Filled 2021-02-21 (×6): qty 0.4

## 2021-02-21 MED ORDER — POLYETHYLENE GLYCOL 3350 17 G PO PACK
17.0000 g | PACK | Freq: Every day | ORAL | Status: DC
  Administered 2021-02-21 – 2021-02-26 (×6): 17 g via ORAL
  Filled 2021-02-21 (×6): qty 1

## 2021-02-21 MED ORDER — INSULIN ASPART 100 UNIT/ML IJ SOLN
0.0000 [IU] | Freq: Three times a day (TID) | INTRAMUSCULAR | Status: DC
  Administered 2021-02-21: 1 [IU] via SUBCUTANEOUS
  Administered 2021-02-21: 4 [IU] via SUBCUTANEOUS
  Administered 2021-02-21 (×2): 1 [IU] via SUBCUTANEOUS
  Administered 2021-02-21: 2 [IU] via SUBCUTANEOUS
  Administered 2021-02-22: 5 [IU] via SUBCUTANEOUS
  Administered 2021-02-22: 6 [IU] via SUBCUTANEOUS
  Administered 2021-02-22: 1 [IU] via SUBCUTANEOUS
  Administered 2021-02-22: 6 [IU] via SUBCUTANEOUS
  Administered 2021-02-23: 3 [IU] via SUBCUTANEOUS
  Administered 2021-02-23: 5 [IU] via SUBCUTANEOUS
  Administered 2021-02-23: 6 [IU] via SUBCUTANEOUS
  Filled 2021-02-21: qty 0.06

## 2021-02-21 MED ORDER — ATORVASTATIN CALCIUM 40 MG PO TABS
40.0000 mg | ORAL_TABLET | Freq: Every day | ORAL | Status: DC
  Administered 2021-02-21 – 2021-02-26 (×6): 40 mg via ORAL
  Filled 2021-02-21 (×6): qty 1

## 2021-02-21 MED ORDER — ASPIRIN 81 MG PO CHEW
81.0000 mg | CHEWABLE_TABLET | Freq: Every morning | ORAL | Status: DC
  Administered 2021-02-21 – 2021-02-26 (×6): 81 mg via ORAL
  Filled 2021-02-21 (×6): qty 1

## 2021-02-21 NOTE — Plan of Care (Signed)
  Problem: Elimination: Goal: Will not experience complications related to bowel motility Outcome: Progressing Goal: Will not experience complications related to urinary retention Outcome: Progressing   Problem: Pain Managment: Goal: General experience of comfort will improve Outcome: Progressing   Problem: Safety: Goal: Ability to remain free from injury will improve Outcome: Progressing   Problem: Skin Integrity: Goal: Risk for impaired skin integrity will decrease Outcome: Progressing   Problem: Urinary Elimination: Goal: Signs and symptoms of infection will decrease Outcome: Progressing

## 2021-02-21 NOTE — Progress Notes (Signed)
  PROGRESS NOTE  Patient admitted earlier this morning. See H&P.   Hayley Fisher is an 87 yo female with medical history significant for dementia, insulin-dependent diabetes, hypertension, history of DVT status post IVC filter in May 2022, hyperlipidemia, hypothyroidism who presents with acute encephalopathy.  Patient has been in the hospital, SNF many times over the past 6 months.  She is currently admitted for acute encephalopathy, UTI.  On examination this morning, patient is oriented to self and hospital only.  She is unable to answer questions appropriately, continues to state "back in the hospital" multiple times.  Per daughter, [name], she states that at baseline when patient is not sick, she is able to carry on a normal conversation, dementia is at a minimum.  Due to dissatisfaction with care at her most recent skilled nursing facility, patient is now back home with family.  Patient's mentation not back to baseline.  I did discuss with daughter that patient is likely to have recurrent hospitalizations with her medical decline, dementia, age.  Continue Rocephin Blood culture,Urine culture pending   Status is: Observation  The patient will require care spanning > 2 midnights and should be moved to inpatient because: Altered mental status  Dispo: The patient is from: Home              Anticipated d/c is to: Home              Patient currently is not medically stable to d/c.   Difficult to place patient No       Dessa Phi, DO [facility] 02/21/2021, 10:30 AM  Available via Epic secure chat 7am-7pm After these hours, please refer to coverage provider listed on [email or web address]

## 2021-02-21 NOTE — Progress Notes (Signed)
Elink following for Sepsis Protocol 

## 2021-02-21 NOTE — Progress Notes (Signed)
Bedside ED RN Hildred Alamin confirms drawning of 2nd lactic acid

## 2021-02-21 NOTE — ED Notes (Signed)
ED TO INPATIENT HANDOFF REPORT  Name/Age/Gender Hayley Fisher 87 y.o. female  Code Status    Code Status Orders  (From admission, onward)         Start     Ordered   02/21/21 0017  Full code  Continuous        02/21/21 0017        Code Status History    Date Active Date Inactive Code Status Order ID Comments User Context   01/15/2021 0122 01/21/2021 0822 Full Code [id number]  Toy Baker, MD ED   12/09/2020 0617 12/15/2020 1812 Full Code [id number]  Vianne Bulls, MD ED   07/03/2020 0050 07/06/2020 2358 Full Code [id number]  Chotiner, Yevonne Aline, MD Inpatient      Home/SNF/Other Home  Chief Complaint AMS (altered mental status) [R41.82]  Level of Care/Admitting Diagnosis ED Disposition    ED Disposition  Admit   Condition  --   [facility]: [facility] [[id number]]  Level of Care: Telemetry [5]  Admit to tele based on following criteria: Other see comments  Comments: RATE CONTROL  May place patient in observation at [facility] or [place] if equivalent level of care is available:: No  Covid Evaluation: Asymptomatic Screening Protocol (No Symptoms)  Diagnosis: AMS (altered mental status) [[id number]]  Admitting Physician: Orene Desanctis [[id number]]  Attending Physician: Orene Desanctis [[id number]]         Medical History Past Medical History:  Diagnosis Date  . Arthritis   . Arthritis   . Cataract    bil cateracts removed  . Chronic kidney disease    "spot on one of my kidneys" per pt  . Diabetes mellitus   . Glaucoma   . Hyperkalemia 07/02/2020  . Hyperlipidemia   . Hyperplastic colon polyp   . Hypertension   . Hypothyroidism 12/09/2020  . Metabolic acidosis 93/81/8299  . Syncope   . Thyroid disease     Allergies Allergies  Allergen Reactions  . Levemir [Insulin Detemir] Itching    IV Location/Drains/Wounds Patient Lines/Drains/Airways Status    Active Line/Drains/Airways    Name Placement date Placement time Site  Days   Peripheral IV 02/20/21 18 G Left Antecubital 02/20/21  2106  Antecubital  1   Peripheral IV 02/20/21 20 G Right Forearm 02/20/21  2242  Forearm  1   Pressure Injury 01/15/21 Sacrum Stage 2 -  Partial thickness loss of dermis presenting as a shallow open injury with a red, pink wound bed without slough. 01/15/21  2050  -- 37          Labs/Imaging Results for orders placed or performed during the hospital encounter of 02/20/21 (from the past 48 hour(s))  Urinalysis, Routine w reflex microscopic Urine, Catheterized     Status: Abnormal   Collection Time: 02/20/21  9:18 PM  Result Value Ref Range   Color, Urine YELLOW YELLOW   APPearance TURBID (A) CLEAR   Specific Gravity, Urine 1.012 1.005 - 1.030   pH 5.0 5.0 - 8.0   Glucose, UA 150 (A) NEGATIVE mg/dL   Hgb urine dipstick LARGE (A) NEGATIVE   Bilirubin Urine NEGATIVE NEGATIVE   Ketones, ur NEGATIVE NEGATIVE mg/dL   Protein, ur 100 (A) NEGATIVE mg/dL   Nitrite NEGATIVE NEGATIVE   Leukocytes,Ua LARGE (A) NEGATIVE   RBC / HPF >50 (H) 0 - 5 RBC/hpf   WBC, UA >50 (H) 0 - 5 WBC/hpf   Bacteria, UA MANY (A) NONE SEEN  WBC Clumps PRESENT     Comment: Performed at [facility], [place] [address], [place], [place] [address]  CBG monitoring, ED     Status: Abnormal   Collection Time: 02/20/21 10:21 PM  Result Value Ref Range   Glucose-Capillary 175 (H) 70 - 99 mg/dL    Comment: Glucose reference range applies only to samples taken after fasting for at least 8 hours.  Lactic acid, plasma     Status: Abnormal   Collection Time: 02/20/21 10:29 PM  Result Value Ref Range   Lactic Acid, Venous 2.3 (HH) 0.5 - 1.9 mmol/L    Comment: CRITICAL VALUE NOTED.  VALUE IS CONSISTENT WITH PREVIOUSLY REPORTED AND CALLED VALUE. Performed at [facility], [place] [address], [place], [place] [address]   Comprehensive metabolic panel     Status: Abnormal   Collection Time: 02/20/21 10:29 PM  Result Value Ref Range    Sodium 131 (L) 135 - 145 mmol/L   Potassium 4.0 3.5 - 5.1 mmol/L   Chloride 95 (L) 98 - 111 mmol/L   CO2 26 22 - 32 mmol/L   Glucose, Bld 167 (H) 70 - 99 mg/dL    Comment: Glucose reference range applies only to samples taken after fasting for at least 8 hours.   BUN 23 8 - 23 mg/dL   Creatinine, Ser 0.73 0.44 - 1.00 mg/dL   Calcium 8.2 (L) 8.9 - 10.3 mg/dL   Total Protein 6.2 (L) 6.5 - 8.1 g/dL   Albumin 2.5 (L) 3.5 - 5.0 g/dL   AST 14 (L) 15 - 41 U/L   ALT 13 0 - 44 U/L   Alkaline Phosphatase 66 38 - 126 U/L   Total Bilirubin 0.5 0.3 - 1.2 mg/dL   GFR, Estimated >60 >60 mL/min    Comment: (NOTE) Calculated using the CKD-EPI Creatinine Equation (2021)    Anion gap 10 5 - 15    Comment: Performed at [facility], [place] [address] [place], [place] [address]  CBC WITH DIFFERENTIAL     Status: Abnormal (Preliminary result)   Collection Time: 02/20/21 10:29 PM  Result Value Ref Range   WBC 5.1 4.0 - 10.5 K/uL   RBC 3.72 (L) 3.87 - 5.11 MIL/uL   Hemoglobin 11.6 (L) 12.0 - 15.0 g/dL   HCT 35.4 (L) 36.0 - 46.0 %   MCV 95.2 80.0 - 100.0 fL   MCH 31.2 26.0 - 34.0 pg   MCHC 32.8 30.0 - 36.0 g/dL   RDW 14.6 11.5 - 15.5 %   Platelets 270 150 - 400 K/uL   nRBC 0.0 0.0 - 0.2 %    Comment: Performed at [facility], [place] [address], [place], [place] [address]   Neutrophils Relative % PENDING %   Neutro Abs PENDING 1.7 - 7.7 K/uL   Band Neutrophils PENDING %   Lymphocytes Relative PENDING %   Lymphs Abs PENDING 0.7 - 4.0 K/uL   Monocytes Relative PENDING %   Monocytes Absolute PENDING 0.1 - 1.0 K/uL   Eosinophils Relative PENDING %   Eosinophils Absolute PENDING 0.0 - 0.5 K/uL   Basophils Relative PENDING %   Basophils Absolute PENDING 0.0 - 0.1 K/uL   WBC Morphology PENDING    RBC Morphology PENDING    Smear Review PENDING    Other PENDING %   nRBC PENDING 0 /100 WBC   Metamyelocytes Relative PENDING %   Myelocytes PENDING %   Promyelocytes  Relative PENDING %  Blasts PENDING %   Immature Granulocytes PENDING %   Abs Immature Granulocytes PENDING 0.00 - 0.07 K/uL  Protime-INR     Status: None   Collection Time: 02/20/21 10:29 PM  Result Value Ref Range   Prothrombin Time 13.7 11.4 - 15.2 seconds   INR 1.1 0.8 - 1.2    Comment: (NOTE) INR goal varies based on device and disease states. Performed at [facility], [place] [address], [place], [place] [address]   APTT     Status: None   Collection Time: 02/20/21 10:29 PM  Result Value Ref Range   aPTT 32 24 - 36 seconds    Comment: Performed at [facility], [place] [address], [place], [place] [address]   CT Head Wo Contrast  Result Date: 02/20/2021 CLINICAL DATA:  Mental status change.  Unknown cause. EXAM: CT HEAD WITHOUT CONTRAST TECHNIQUE: Contiguous axial images were obtained from the base of the skull through the vertex without intravenous contrast. COMPARISON:  CT head 01/14/2021, CT head 09/18/2011 FINDINGS: Brain: Cerebral ventricle sizes are concordant with the degree of cerebral volume loss. Patchy and confluent areas of decreased attenuation are noted throughout the deep and periventricular white matter of the cerebral hemispheres bilaterally, compatible with chronic microvascular ischemic disease. No evidence of large-territorial acute infarction. No parenchymal hemorrhage. No mass lesion. No extra-axial collection. No mass effect or midline shift. No hydrocephalus. Basilar cisterns are patent. Empty sella. Vascular: No hyperdense vessel. Atherosclerotic calcifications are present within the cavernous internal carotid arteries. Skull: No acute fracture or focal lesion. Sinuses/Orbits: Paranasal sinuses and mastoid air cells are clear. The orbits are unremarkable. Other: None. IMPRESSION: 1. No acute intracranial abnormality. 2. Empty sella. Findings is often a normal anatomic variant but can be associated with idiopathic intracranial  hypertension (pseudotumor cerebri). Electronically Signed   By: Iven Finn M.D.   On: 02/20/2021 23:37   DG Chest Port 1 View  Result Date: 02/20/2021 CLINICAL DATA:  Recent diagnosis of urinary tract infection. Intermittent confusion. History of diabetes and hypertension. EXAM: PORTABLE CHEST 1 VIEW COMPARISON:  01/14/2021 FINDINGS: Shallow inspiration with infiltration or atelectasis in the lung bases, greater on the left. Normal heart size and pulmonary vascularity. No pleural effusions. No pneumothorax. Mediastinal contours appear intact. IMPRESSION: Shallow inspiration with infiltration or atelectasis in the lung bases, greater on the left. Electronically Signed   By: Lucienne Capers M.D.   On: 02/20/2021 22:02    Pending Labs Unresulted Labs (From admission, onward)    Start     Ordered   02/21/21 0500  CBC  Tomorrow morning,   R        02/21/21 0017   02/21/21 3329  Basic metabolic panel  Tomorrow morning,   R        02/21/21 0017   02/21/21 0018  Lactic acid, plasma  STAT Now then every 3 hours,   R (with STAT occurrences)      02/21/21 0017   02/20/21 2300  Resp Panel by RT-PCR (Flu A&B, Covid) Nasopharyngeal Swab  (Tier 2 - Symptomatic/asymptomatic with Precautions )  Once,   STAT       Question Answer Comment  Is this test for diagnosis or screening Screening   Symptomatic for COVID-19 as defined by CDC No   Hospitalized for COVID-19 No   Admitted to ICU for COVID-19 No   Previously tested for COVID-19 Yes   Resident in a congregate (group) care setting No   Employed in  healthcare setting No   Pregnant No   Has patient completed COVID vaccination(s) (2 doses of Pfizer/Moderna 1 dose of [employer]) Unknown      02/20/21 2259   02/20/21 2136  Lactic acid, plasma  (Undifferentiated presentation (screening labs and basic nursing orders))  Now then every 2 hours,   STAT      02/20/21 2136   02/20/21 2136  Blood Culture (routine x 2)  (Undifferentiated presentation  (screening labs and basic nursing orders))  BLOOD CULTURE X 2,   STAT      02/20/21 2136   02/20/21 2109  Urine culture  ONCE - STAT,   STAT        02/20/21 2108          Vitals/Pain Today's Vitals   02/20/21 2229 02/20/21 2245 02/20/21 2300 02/21/21 0000  BP:  115/64 112/75 121/68  Pulse:  (!) 108 (!) 103 (!) 101  Resp:  16 17 (!) 25  Temp: 100 F (37.8 C)     TempSrc: Rectal     SpO2:  97% 99% 98%  Weight:      Height:      PainSc:        Isolation Precautions Airborne and Contact precautions  Medications Medications  enoxaparin (LOVENOX) injection 40 mg (has no administration in time range)  acetaminophen (TYLENOL) tablet 650 mg (has no administration in time range)  cefTRIAXone (ROCEPHIN) 1 g in sodium chloride 0.9 % 100 mL IVPB (has no administration in time range)  insulin aspart (novoLOG) injection 0-6 Units (has no administration in time range)  lactated ringers bolus 1,000 mL (0 mLs Intravenous Stopped 02/20/21 2340)  cefTRIAXone (ROCEPHIN) 1 g in sodium chloride 0.9 % 100 mL IVPB (0 g Intravenous Stopped 02/20/21 2340)  acetaminophen (TYLENOL) tablet 650 mg (650 mg Oral Given 02/20/21 2253)  lactated ringers bolus 1,000 mL (1,000 mLs Intravenous New Bag/Given 02/20/21 2340)    Mobility walks with device

## 2021-02-21 NOTE — H&P (Signed)
History and Physical    Hayley Fisher SWN:[id number] DOB: [birth year] DOA: 02/20/2021  PCP: [facility], [facility]  Patient coming from: Home-previously at SNF but family took back home as they were reportedly unsatisfied with care  I have personally briefly reviewed patient's old medical records in [place]  Chief Complaint: AMS  HPI: Hayley Fisher is a 87 y.o. female with medical history significant for cognitive impairment insulin-dependent type 2 diabetes, hypertension, chronic venous insufficiency, history of DVT s/p IVC on 01/16/2021, hyperlipidemia, hypothyroidism and GERD who presents with altered mental status.  Patient unable to provide history as she is only alert oriented to self and place.  Reportedly by ED physician, she was brought over by SNF for altered mental status and was recently diagnosed with UTI but not yet started on antibiotics.  Patient currently is comfortable and denies any pain.  Of note, patient's last hospitalization on 5/26- 6/1 was also for acute metabolic encephalopathy secondary to severe sepsis from UTI.  She was treated with IV Rocephin at that time although culture remain negative.  She also had a drop in hemoglobin requiring PRBC transfusion.  At that time she also underwent IVC filter for age-indeterminate bilateral lower extremity DVT.  ED Course: She had a temperature of 100 F, was tachycardic and occasionally tachypneic with normal blood pressure.  No leukocytosis.  Hemoglobin of 11.6, hematocrit 35.4 which appears to be hemoconcentrated from prior.  Lactate of 2.3.  Sodium of 131.  BG of 167. UA shows large leukocyte, negative nitrate and many bacteria  CT of the head is negative with incidental finding of empty sella.  Review of Systems: Able to obtain given patient's dementia  Past Medical History:  Diagnosis Date   Arthritis    Arthritis    Cataract    bil cateracts removed   Chronic kidney disease     "spot on one of my kidneys" per pt   Diabetes mellitus    Glaucoma    Hyperkalemia 07/02/2020   Hyperlipidemia    Hyperplastic colon polyp    Hypertension    Hypothyroidism 9/38/1829   Metabolic acidosis 93/71/6967   Syncope    Thyroid disease     Past Surgical History:  Procedure Laterality Date   ABDOMINAL HYSTERECTOMY     BLADDER SUSPENSION     bladder tacking     COLONOSCOPY     EYE SURGERY     INGUINAL HERNIA REPAIR Right    IR IVC FILTER PLMT / S&I /IMG GUID/MOD SED  01/16/2021     reports that she quit smoking about 40 years ago. Her smoking use included cigarettes. She has never used smokeless tobacco. She reports that she does not drink alcohol and does not use drugs. Social History  Allergies  Allergen Reactions   Levemir [Insulin Detemir] Itching    Family History  Problem Relation Age of Onset   Colon cancer Mother    Other Father        cerebral hemorrhage   Diabetes Brother    Diabetes Sister        x 2   Bone cancer Daughter    Breast cancer Daughter 26   Esophageal cancer Neg Hx    Rectal cancer Neg Hx    Stomach cancer Neg Hx      Prior to Admission medications   Medication Sig Start Date End Date Taking? Authorizing Provider  ACCU-CHEK AVIVA PLUS test strip USE TO TEST BLOOD SUGAR UP TO 3  TIMES A DAY. Patient taking differently: 1 each by Other route See admin instructions. USE TO TEST BLOOD SUGAR UP  TO 3 TIMES A DAY. 06/30/20   Mullis, Kiersten P, DO  Accu-Chek Softclix Lancets lancets USE TO TEST BLOOD SUGAR UP TO 3 TIMES A DAY. Patient taking differently: 1 each by Other route See admin instructions. USE TO TEST BLOOD SUGAR UP  TO 3 TIMES A DAY. 06/30/20   Mullis, Kiersten P, DO  aspirin 81 MG chewable tablet Chew 81 mg by mouth every morning.    [provider]  atorvastatin (LIPITOR) 40 MG tablet Take 1 tablet (40 mg total) by mouth daily. 09/21/20   Mullis, Kiersten P, DO  brimonidine (ALPHAGAN P) 0.1 % SOLN Place 1 drop into both  eyes 2 (two) times daily. 09/20/11     carbamide peroxide (DEBROX) 6.5 % OTIC solution Place 2 drops into both ears 2 (two) times daily as needed (ear wax). 01/20/21   Aline August, MD  Catheter Self-Adhesive Urinary MISC 1 patch by Does not apply route daily. 10/26/20   Anderson, Chelsey L, DO  docusate sodium (COLACE) 100 MG capsule Take 1 capsule (100 mg total) by mouth 2 (two) times daily. 12/15/20   Harold Hedge, MD  dorzolamide-timolol (COSOPT) 22.3-6.8 MG/ML ophthalmic solution Place 1 drop into both eyes 2 (two) times daily. 05/18/20   [provider]  DULoxetine (CYMBALTA) 60 MG capsule TAKE 1 CAPSULE BY MOUTH EVERY DAY Patient taking differently: Take 60 mg by mouth daily. 10/22/18   Harriet Butte, DO  enalapril (VASOTEC) 20 MG tablet Take 1 tablet (20 mg total) by mouth daily. 09/21/20   Mullis, Kiersten P, DO  famotidine (PEPCID) 20 MG tablet Take 1 tablet (20 mg total) by mouth daily. If needed can take twice a day. 09/16/20   Mullis, Kiersten P, DO  ferrous sulfate 325 (65 FE) MG tablet Take 325 mg by mouth daily with breakfast.    [provider]  insulin glargine (LANTUS) 100 UNIT/ML injection Inject 0.15 mLs (15 Units total) into the skin daily. 12/15/20   Harold Hedge, MD  insulin lispro (HUMALOG) 100 UNIT/ML injection Inject 2-15 Units into the skin See admin instructions. Per sliding scale, subcutaneous with meals if blood sugar is 70-150 = 0 units  151-200 = 2 units  201-250 = 5 units  251-300 = 8 units  301-350 = 11 units  351-400 = 15 units  >400 = Call MD.   Inject units into skin according to the sliding scale three times daily with meals.    [provider]  ketoconazole (NIZORAL) 2 % cream Apply 1 application topically daily. 11/19/20   Criselda Peaches, DPM  levothyroxine (SYNTHROID) 50 MCG tablet Take 1 tablet (50 mcg total) by mouth daily. 11/10/20   Renato Shin, MD  metFORMIN (GLUCOPHAGE) 1000 MG tablet Take 1,000 mg by mouth daily. 01/11/21    [provider]  mirabegron ER (MYRBETRIQ) 25 MG TB24 tablet Take 1 tablet (25 mg total) by mouth daily. 09/16/20   Mullis, Kiersten P, DO  Multiple Vitamins-Minerals (PRESERVISION AREDS 2+MULTI VIT) CAPS Take 1 capsule by mouth daily.    [provider]  ondansetron (ZOFRAN ODT) 4 MG disintegrating tablet Take 1 tablet (4 mg total) by mouth every 8 (eight) hours as needed for nausea. 07/28/20   Larene Pickett, PA-C  polyethylene glycol powder (GLYCOLAX/MIRALAX) 17 GM/SCOOP powder Take 17 g by mouth daily. 12/15/20   Harold Hedge, MD  Semaglutide (RYBELSUS) 3 MG TABS Take 3 mg by mouth daily. 11/09/20   Renato Shin, MD  SURE COMFORT INSULIN SYRINGE 31G X 5/16" 0.3 ML MISC use 1 syringe with insulin daily Patient taking differently: Inject 1 Syringe into the skin daily. 11/12/20   Mullis, Kiersten P, DO  tamsulosin (FLOMAX) 0.4 MG CAPS capsule Take 0.4 mg by mouth daily. 10/08/20   [provider]  VYZULTA 0.024 % SOLN Place 1 drop into both eyes at bedtime. 11/06/17   [provider]  witch hazel-glycerin (TUCKS) pad Apply topically as needed for itching. 12/15/20   Harold Hedge, MD    Physical Exam: Vitals:   02/20/21 2229 02/20/21 2245 02/20/21 2300 02/21/21 0000  BP:  115/64 112/75 121/68  Pulse:  (!) 108 (!) 103 (!) 101  Resp:  16 17 (!) 25  Temp: 100 F (37.8 C)     TempSrc: Rectal     SpO2:  97% 99% 98%  Weight:      Height:        Constitutional: NAD, calm, comfortable, elderly female laying at 30 degree incline in bed Vitals:   02/20/21 2229 02/20/21 2245 02/20/21 2300 02/21/21 0000  BP:  115/64 112/75 121/68  Pulse:  (!) 108 (!) 103 (!) 101  Resp:  16 17 (!) 25  Temp: 100 F (37.8 C)     TempSrc: Rectal     SpO2:  97% 99% 98%  Weight:      Height:       Eyes: PERRL, lids and conjunctivae normal ENMT: Mucous membranes are moist.  Neck: normal, supple Respiratory: clear to auscultation bilaterally, no wheezing, no crackles. Normal  respiratory effort. No accessory muscle use.  Cardiovascular: Regular rate and rhythm, no murmurs / rubs / gallops. + 1 pitting edema of the distal pretibial region of the lower extremity worse on the left abdomen: no tenderness, no masses palpated.  Bowel sounds positive.  Musculoskeletal: no clubbing / cyanosis. No joint deformity upper and lower extremities. Good ROM, no contractures. Normal muscle tone.  Skin: no rashes, lesions, ulcers. No induration Neurologic: CN 2-12 grossly intact. Sensation intact,  Strength 5/5 in all 4.  Psychiatric: Alert and oriented only to self and place. normal mood.    Labs on Admission: I have personally reviewed following labs and imaging studies  CBC: Recent Labs  Lab 02/20/21 2229  WBC 5.1  NEUTROABS PENDING  HGB 11.6*  HCT 35.4*  MCV 95.2  PLT 269   Basic Metabolic Panel: Recent Labs  Lab 02/20/21 2229  NA 131*  K 4.0  CL 95*  CO2 26  GLUCOSE 167*  BUN 23  CREATININE 0.73  CALCIUM 8.2*   GFR: Estimated Creatinine Clearance: 44.6 mL/min (by C-G formula based on SCr of 0.73 mg/dL). Liver Function Tests: Recent Labs  Lab 02/20/21 2229  AST 14*  ALT 13  ALKPHOS 66  BILITOT 0.5  PROT 6.2*  ALBUMIN 2.5*   No results for input(s): LIPASE, AMYLASE in the last 168 hours. No results for input(s): AMMONIA in the last 168 hours. Coagulation Profile: Recent Labs  Lab 02/20/21 2229  INR 1.1   Cardiac Enzymes: No results for input(s): CKTOTAL, CKMB, CKMBINDEX, TROPONINI in the last 168 hours. BNP (last 3 results) No results for input(s): PROBNP in the last 8760 hours. HbA1C: No results for input(s): HGBA1C in the last 72 hours. CBG: Recent Labs  Lab 02/20/21 2221  GLUCAP 175*   Lipid Profile: No results for input(s):  CHOL, HDL, LDLCALC, TRIG, CHOLHDL, LDLDIRECT in the last 72 hours. Thyroid Function Tests: No results for input(s): TSH, T4TOTAL, FREET4, T3FREE, THYROIDAB in the last 72 hours. Anemia Panel: No results for  input(s): VITAMINB12, FOLATE, FERRITIN, TIBC, IRON, RETICCTPCT in the last 72 hours. Urine analysis:    Component Value Date/Time   COLORURINE YELLOW 02/20/2021 2118   APPEARANCEUR TURBID (A) 02/20/2021 2118   LABSPEC 1.012 02/20/2021 2118   PHURINE 5.0 02/20/2021 2118   GLUCOSEU 150 (A) 02/20/2021 2118   HGBUR LARGE (A) 02/20/2021 2118   HGBUR trace-intact 07/20/2010 1433   BILIRUBINUR NEGATIVE 02/20/2021 2118   BILIRUBINUR negative 09/09/2020 [place] 08/09/2016 1500   KETONESUR NEGATIVE 02/20/2021 2118   PROTEINUR 100 (A) 02/20/2021 2118   UROBILINOGEN 1.0 10/23/2020 1420   NITRITE NEGATIVE 02/20/2021 2118   LEUKOCYTESUR LARGE (A) 02/20/2021 2118    Radiological Exams on Admission: CT Head Wo Contrast  Result Date: 02/20/2021 CLINICAL DATA:  Mental status change.  Unknown cause. EXAM: CT HEAD WITHOUT CONTRAST TECHNIQUE: Contiguous axial images were obtained from the base of the skull through the vertex without intravenous contrast. COMPARISON:  CT head 01/14/2021, CT head 09/18/2011 FINDINGS: Brain: Cerebral ventricle sizes are concordant with the degree of cerebral volume loss. Patchy and confluent areas of decreased attenuation are noted throughout the deep and periventricular white matter of the cerebral hemispheres bilaterally, compatible with chronic microvascular ischemic disease. No evidence of large-territorial acute infarction. No parenchymal hemorrhage. No mass lesion. No extra-axial collection. No mass effect or midline shift. No hydrocephalus. Basilar cisterns are patent. Empty sella. Vascular: No hyperdense vessel. Atherosclerotic calcifications are present within the cavernous internal carotid arteries. Skull: No acute fracture or focal lesion. Sinuses/Orbits: Paranasal sinuses and mastoid air cells are clear. The orbits are unremarkable. Other: None. IMPRESSION: 1. No acute intracranial abnormality. 2. Empty sella. Findings is often a normal anatomic variant but  can be associated with idiopathic intracranial hypertension (pseudotumor cerebri). Electronically Signed   By: Iven Finn M.D.   On: 02/20/2021 23:37   DG Chest Port 1 View  Result Date: 02/20/2021 CLINICAL DATA:  Recent diagnosis of urinary tract infection. Intermittent confusion. History of diabetes and hypertension. EXAM: PORTABLE CHEST 1 VIEW COMPARISON:  01/14/2021 FINDINGS: Shallow inspiration with infiltration or atelectasis in the lung bases, greater on the left. Normal heart size and pulmonary vascularity. No pleural effusions. No pneumothorax. Mediastinal contours appear intact. IMPRESSION: Shallow inspiration with infiltration or atelectasis in the lung bases, greater on the left. Electronically Signed   By: Lucienne Capers M.D.   On: 02/20/2021 22:02      Assessment/Plan Sepsis secondary to UTI -Patient presented with tachycardia,tachypnea -Continue IV Rocephin with urine culture pending -Continue to trend lactic acid -Has already received 2 L of LR in the ED  Insulin-dependent type 2 diabetes -Placed on very sensitive sliding scale  HTN -stable.  Continue antihypertensives  Hypothyroidism - Continue levothyroxine  Hyperlipidemia - Continue statin  Cognitive impairment - Unclear if baseline but currently alert and oriented only to self and place  DVT prophylaxis:.Lovenox Code Status: Full Family Communication: No family at bedside disposition Plan: Home with observation Consults called:  Admission status: Observation  Level of care: Telemetry  Status is: Observation  The patient remains OBS appropriate and will d/c before 2 midnights.  Dispo: The patient is from: Home              Anticipated d/c is to: Home  Patient currently is not medically stable to d/c.   Difficult to place patient No         Orene Desanctis DO [facility]   If 7PM-7AM, please contact night-coverage [email or web address]   02/21/2021, 12:31 AM

## 2021-02-21 NOTE — ED Provider Notes (Signed)
I assumed care in signout to follow-up with patient's labs and imaging.  Patient presents  for altered mental status, UTI that has not been treated yet. Patient had a rectal temp of 100 and was found to have an obvious UTI on labs. I have personally reviewed chest x-ray as well as CT head and there are no acute findings Patient is awake and alert and appears to be responding to IV fluids and IV antibiotics.  Her blood pressure is appropriate Code sepsis has been initiated due to altered mental status, evidence of infection and elevated lactate.  Discussed with hospitalist for admission. BP 121/68   Pulse (!) 101   Temp 100 F (37.8 C) (Rectal)   Resp (!) 25   Ht 1.651 m (5\' 5" )   Wt 65.7 kg   SpO2 98%   BMI 24.10 kg/m    .Critical Care  Date/Time: 02/21/2021 12:17 AM Performed by: Ripley Fraise, MD Authorized by: Ripley Fraise, MD   Critical care provider statement:    Critical care time (minutes):  40   Critical care start time:  02/20/2021 11:30 PM   Critical care end time:  02/21/2021 12:10 AM   Critical care time was exclusive of:  Separately billable procedures and treating other patients   Critical care was necessary to treat or prevent imminent or life-threatening deterioration of the following conditions:  Sepsis, shock and CNS failure or compromise   Critical care was time spent personally by me on the following activities:  Discussions with consultants, evaluation of patient's response to treatment, examination of patient, pulse oximetry, ordering and review of radiographic studies, ordering and review of laboratory studies and ordering and performing treatments and interventions   I assumed direction of critical care for this patient from another provider in my specialty: yes     Care discussed with: admitting provider      Ripley Fraise, MD 02/21/21 0018

## 2021-02-22 LAB — BASIC METABOLIC PANEL
Anion gap: 7 (ref 5–15)
BUN: 19 mg/dL (ref 8–23)
CO2: 29 mmol/L (ref 22–32)
Calcium: 8.2 mg/dL — ABNORMAL LOW (ref 8.9–10.3)
Chloride: 101 mmol/L (ref 98–111)
Creatinine, Ser: 0.66 mg/dL (ref 0.44–1.00)
GFR, Estimated: >60 mL/min (ref >60)
Glucose, Bld: 150 mg/dL — ABNORMAL HIGH (ref 70–99)
Potassium: 4.2 mmol/L (ref 3.5–5.1)
Sodium: 137 mmol/L (ref 135–145)

## 2021-02-22 LAB — CBC
HCT: 33.3 % — ABNORMAL LOW (ref 36.0–46.0)
Hemoglobin: 10.8 g/dL — ABNORMAL LOW (ref 12.0–15.0)
MCH: 30.7 pg (ref 26.0–34.0)
MCHC: 32.4 g/dL (ref 30.0–36.0)
MCV: 94.6 fL (ref 80.0–100.0)
Platelets: 305 10*3/uL (ref 150–400)
RBC: 3.52 MIL/uL — ABNORMAL LOW (ref 3.87–5.11)
RDW: 14.8 % (ref 11.5–15.5)
WBC: 4.8 10*3/uL (ref 4.0–10.5)
nRBC: 0 % (ref 0.0–0.2)

## 2021-02-22 LAB — GLUCOSE, CAPILLARY
Glucose-Capillary: 136 mg/dL — ABNORMAL HIGH (ref 70–99)
Glucose-Capillary: 152 mg/dL — ABNORMAL HIGH (ref 70–99)
Glucose-Capillary: 376 mg/dL — ABNORMAL HIGH (ref 70–99)
Glucose-Capillary: 445 mg/dL — ABNORMAL HIGH (ref 70–99)
Glucose-Capillary: 410 mg/dL — ABNORMAL HIGH (ref 70–99)
Glucose-Capillary: 445 mg/dL — ABNORMAL HIGH (ref 70–99)
Glucose-Capillary: 439 mg/dL — ABNORMAL HIGH (ref 70–99)

## 2021-02-22 MED ORDER — INSULIN ASPART 100 UNIT/ML IJ SOLN
6.0000 [IU] | Freq: Once | INTRAMUSCULAR | Status: AC
  Administered 2021-02-22: 6 [IU] via SUBCUTANEOUS

## 2021-02-22 NOTE — Progress Notes (Signed)
PROGRESS NOTE    Hayley Fisher  GYI:[id number] DOB: 11/10/1930 DOA: 02/20/2021 PCP: Program, [name]     Brief Narrative:  Hayley Fisher is an 87 yo female with medical history significant for dementia, insulin-dependent diabetes, hypertension, history of DVT status post IVC filter in May 2022, hyperlipidemia, hypothyroidism who presents with acute encephalopathy.  Patient has been in the hospital, SNF many times over the past 6 months.  She is currently admitted for acute encephalopathy, UTI.   New events last 24 hours / Subjective: Patient sitting in recliner this morning, eating breakfast.  She tells me that she spilled her milk this morning.  When asked if she knew where she was, she continued to perseverate on her spilled milk.  Assessment & Plan:   Active Problems:   Hyperlipidemia associated with type 2 diabetes mellitus ([place])   Hypertension associated with diabetes ([place])   Hypothyroidism   Sepsis ([place])   AMS (altered mental status)   Cognitive impairment   Acute metabolic encephalopathy   Acute metabolic encephalopathy on underlying dementia -Waxing and waning mentation, likely to worsen with age/dementia -Delirium precaution  Klebsiella UTI, present on admission -Klebsiella in urine culture, sensitivity to follow -Continue Rocephin -Blood cultures negative to date  Diabetes mellitus -Sliding scale insulin  Hypertension -Continue enalapril  Hyperlipidemia -Continue Lipitor  Hypothyroidism -Continue Synthroid  History of bilateral lower extremity DVT -IVC filter placed 01/16/2021     DVT prophylaxis:  enoxaparin (LOVENOX) injection 40 mg Start: 02/21/21 1000  Code Status:     Code Status Orders  (From admission, onward)           Start     Ordered   02/21/21 0017  Full code  Continuous        02/21/21 0017           Code Status History     Date Active Date Inactive Code Status Order ID Comments User  Context   01/15/2021 0122 01/21/2021 0822 Full Code [id number]  Toy Baker, MD ED   12/09/2020 0617 12/15/2020 1812 Full Code [id number]  Vianne Bulls, MD ED   07/03/2020 0050 07/06/2020 2358 Full Code [id number]  Chotiner, Yevonne Aline, MD Inpatient      Family Communication: None at bedside Disposition Plan:  Status is: Inpatient  Remains inpatient appropriate because:Altered mental status  Dispo: The patient is from: Home              Anticipated d/c is to: Home              Patient currently is not medically stable to d/c.   Difficult to place patient No      Antimicrobials:  Anti-infectives (From admission, onward)    Start     Dose/Rate Route Frequency Ordered Stop   02/22/21 0000  cefTRIAXone (ROCEPHIN) 1 g in sodium chloride 0.9 % 100 mL IVPB        1 g 200 mL/hr over 30 Minutes Intravenous Every 24 hours 02/21/21 0017     02/20/21 2230  cefTRIAXone (ROCEPHIN) 1 g in sodium chloride 0.9 % 100 mL IVPB        1 g 200 mL/hr over 30 Minutes Intravenous  Once 02/20/21 2220 02/20/21 2340        Objective: Vitals:   02/21/21 1731 02/21/21 2132 02/22/21 0442 02/22/21 1225  BP: 128/63 135/71 135/67 131/70  Pulse: (!) 111 (!) 110 (!) 109 (!) 107  Resp: 18 20 16 16   Temp:  98.5 F (36.9 C) 98.7 F (37.1 C) 98 F (36.7 C) 98.6 F (37 C)  TempSrc: Oral Oral  Oral  SpO2: 100% 95% 97% 95%  Weight:      Height:        Intake/Output Summary (Last 24 hours) at 02/22/2021 1248 Last data filed at 02/22/2021 0500 Gross per 24 hour  Intake 100 ml  Output --  Net 100 ml   Filed Weights   02/20/21 2057 02/20/21 2107 02/21/21 0119  Weight: 65.8 kg 65.7 kg 60.1 kg    Examination:  General exam: Appears calm and comfortable, pleasantly confused Respiratory system: Clear to auscultation. Respiratory effort normal. No respiratory distress. No conversational dyspnea.  Cardiovascular system: S1 & S2 heard. No murmurs. No pedal edema. Gastrointestinal system: Abdomen is  nondistended, soft and nontender. Normal bowel sounds heard. Central nervous system: Alert  Extremities: Symmetric in appearance  Skin: No rashes, lesions or ulcers on exposed skin    Data Reviewed: I have personally reviewed following labs and imaging studies  CBC: Recent Labs  Lab 02/20/21 2229 02/21/21 0605 02/22/21 0507  WBC 5.1 4.7 4.8  NEUTROABS 3.7  --   --   HGB 11.6* 11.2* 10.8*  HCT 35.4* 34.7* 33.3*  MCV 95.2 95.3 94.6  PLT 270 276 099   Basic Metabolic Panel: Recent Labs  Lab 02/20/21 2229 02/21/21 0605 02/22/21 0507  NA 131* 133* 137  K 4.0 3.8 4.2  CL 95* 97* 101  CO2 26 26 29   GLUCOSE 167* 170* 150*  BUN 23 19 19   CREATININE 0.73 0.78 0.66  CALCIUM 8.2* 8.2* 8.2*   GFR: Estimated Creatinine Clearance: 41.2 mL/min (by C-G formula based on SCr of 0.66 mg/dL). Liver Function Tests: Recent Labs  Lab 02/20/21 2229  AST 14*  ALT 13  ALKPHOS 66  BILITOT 0.5  PROT 6.2*  ALBUMIN 2.5*   No results for input(s): LIPASE, AMYLASE in the last 168 hours. No results for input(s): AMMONIA in the last 168 hours. Coagulation Profile: Recent Labs  Lab 02/20/21 2229  INR 1.1   Cardiac Enzymes: No results for input(s): CKTOTAL, CKMB, CKMBINDEX, TROPONINI in the last 168 hours. BNP (last 3 results) No results for input(s): PROBNP in the last 8760 hours. HbA1C: No results for input(s): HGBA1C in the last 72 hours. CBG: Recent Labs  Lab 02/21/21 1718 02/21/21 2132 02/22/21 0239 02/22/21 0759 02/22/21 1224  GLUCAP 328* 223* 136* 152* 376*   Lipid Profile: No results for input(s): CHOL, HDL, LDLCALC, TRIG, CHOLHDL, LDLDIRECT in the last 72 hours. Thyroid Function Tests: No results for input(s): TSH, T4TOTAL, FREET4, T3FREE, THYROIDAB in the last 72 hours. Anemia Panel: No results for input(s): VITAMINB12, FOLATE, FERRITIN, TIBC, IRON, RETICCTPCT in the last 72 hours. Sepsis Labs: Recent Labs  Lab 02/20/21 2229 02/21/21 0250 02/21/21 0605   LATICACIDVEN 2.3* 1.7 1.0    Recent Results (from the past 240 hour(s))  Urine culture     Status: Abnormal (Preliminary result)   Collection Time: 02/20/21  9:18 PM   Specimen: Urine, Catheterized  Result Value Ref Range Status   Specimen Description   Final    URINE, CATHETERIZED Performed at [facility] [address] [place], [place] [address]    Special Requests   Final    NONE Performed at [facility], [place] [address] [place], [place] [address]    Culture (A)  Final    >=100,000 COLONIES/mL KLEBSIELLA PNEUMONIAE SUSCEPTIBILITIES TO FOLLOW Performed at  [facility], [place] [address] [place], [place] [address]    Report Status PENDING  Incomplete  Blood Culture (routine x 2)     Status: None (Preliminary result)   Collection Time: 02/20/21 10:29 PM   Specimen: BLOOD  Result Value Ref Range Status   Specimen Description   Final    BLOOD BLOOD RIGHT FOREARM Performed at [facility] [address] [place], [place] [address]    Special Requests   Final    BOTTLES DRAWN AEROBIC ONLY Blood Culture adequate volume Performed at [facility] [address], [place], [place] [address]    Culture   Final    NO GROWTH 1 DAY Performed at [facility], [place] [address], [place], [place] [address]    Report Status PENDING  Incomplete  Blood Culture (routine x 2)     Status: None (Preliminary result)   Collection Time: 02/20/21 10:29 PM   Specimen: BLOOD  Result Value Ref Range Status   Specimen Description   Final    BLOOD BLOOD RIGHT FOREARM Performed at [facility] [address], [place], [place] [address]    Special Requests   Final    ANAEROBIC BOTTLE ONLY Blood Culture results may not be optimal due to an inadequate volume of blood received in culture bottles Performed at [facility] [address] [place], [place] [address]    Culture    Final    NO GROWTH 1 DAY Performed at [facility], [place] [address], [place],  [address]    Report Status PENDING  Incomplete  Resp Panel by RT-PCR (Flu A&B, Covid) Nasopharyngeal Swab     Status: None   Collection Time: 02/20/21 11:00 PM   Specimen: Nasopharyngeal Swab; Nasopharyngeal(NP) swabs in vial transport medium  Result Value Ref Range Status   SARS Coronavirus 2 by RT PCR NEGATIVE NEGATIVE Final    Comment: (NOTE) SARS-CoV-2 target nucleic acids are NOT DETECTED.  The SARS-CoV-2 RNA is generally detectable in upper respiratory specimens during the acute phase of infection. The lowest concentration of SARS-CoV-2 viral copies this assay can detect is 138 copies/mL. A negative result does not preclude SARS-Cov-2 infection and should not be used as the sole basis for treatment or other patient management decisions. A negative result may occur with  improper specimen collection/handling, submission of specimen other than nasopharyngeal swab, presence of viral mutation(s) within the areas targeted by this assay, and inadequate number of viral copies(<138 copies/mL). A negative result must be combined with clinical observations, patient history, and epidemiological information. The expected result is Negative.  Fact Sheet for Patients:  EntrepreneurPulse.com.au  Fact Sheet for Healthcare Providers:  IncredibleEmployment.be  This test is no t yet approved or cleared by the [place] FDA and  has been authorized for detection and/or diagnosis of SARS-CoV-2 by FDA under an Emergency Use Authorization (EUA). This EUA will remain  in effect (meaning this test can be used) for the duration of the COVID-19 declaration under Section 564(b)(1) of the Act, 21 U.S.C.section 360bbb-3(b)(1), unless the authorization is terminated  or revoked sooner.       Influenza A by PCR NEGATIVE NEGATIVE Final   Influenza B by PCR NEGATIVE  NEGATIVE Final    Comment: (NOTE) The Xpert Xpress SARS-CoV-2/FLU/RSV plus assay is intended as an aid in the diagnosis of influenza from Nasopharyngeal swab specimens and should not be used as a sole basis for  treatment. Nasal washings and aspirates are unacceptable for Xpert Xpress SARS-CoV-2/FLU/RSV testing.  Fact Sheet for Patients: EntrepreneurPulse.com.au  Fact Sheet for Healthcare Providers: IncredibleEmployment.be  This test is not yet approved or cleared by the [place] FDA and has been authorized for detection and/or diagnosis of SARS-CoV-2 by FDA under an Emergency Use Authorization (EUA). This EUA will remain in effect (meaning this test can be used) for the duration of the COVID-19 declaration under Section 564(b)(1) of the Act, 21 U.S.C. section 360bbb-3(b)(1), unless the authorization is terminated or revoked.  Performed at [facility], [place] [address] [place], [place] [address]       Radiology Studies: CT Head Wo Contrast  Result Date: 02/20/2021 CLINICAL DATA:  Mental status change.  Unknown cause. EXAM: CT HEAD WITHOUT CONTRAST TECHNIQUE: Contiguous axial images were obtained from the base of the skull through the vertex without intravenous contrast. COMPARISON:  CT head 01/14/2021, CT head 09/18/2011 FINDINGS: Brain: Cerebral ventricle sizes are concordant with the degree of cerebral volume loss. Patchy and confluent areas of decreased attenuation are noted throughout the deep and periventricular white matter of the cerebral hemispheres bilaterally, compatible with chronic microvascular ischemic disease. No evidence of large-territorial acute infarction. No parenchymal hemorrhage. No mass lesion. No extra-axial collection. No mass effect or midline shift. No hydrocephalus. Basilar cisterns are patent. Empty sella. Vascular: No hyperdense vessel. Atherosclerotic calcifications are present within the cavernous  internal carotid arteries. Skull: No acute fracture or focal lesion. Sinuses/Orbits: Paranasal sinuses and mastoid air cells are clear. The orbits are unremarkable. Other: None. IMPRESSION: 1. No acute intracranial abnormality. 2. Empty sella. Findings is often a normal anatomic variant but can be associated with idiopathic intracranial hypertension (pseudotumor cerebri). Electronically Signed   By: Iven Finn M.D.   On: 02/20/2021 23:37   DG Chest Port 1 View  Result Date: 02/20/2021 CLINICAL DATA:  Recent diagnosis of urinary tract infection. Intermittent confusion. History of diabetes and hypertension. EXAM: PORTABLE CHEST 1 VIEW COMPARISON:  01/14/2021 FINDINGS: Shallow inspiration with infiltration or atelectasis in the lung bases, greater on the left. Normal heart size and pulmonary vascularity. No pleural effusions. No pneumothorax. Mediastinal contours appear intact. IMPRESSION: Shallow inspiration with infiltration or atelectasis in the lung bases, greater on the left. Electronically Signed   By: Lucienne Capers M.D.   On: 02/20/2021 22:02      Scheduled Meds:  aspirin  81 mg Oral q morning   atorvastatin  40 mg Oral Daily   enalapril  20 mg Oral Daily   enoxaparin (LOVENOX) injection  40 mg Subcutaneous Q24H   feeding supplement  237 mL Oral BID BM   insulin aspart  0-6 Units Subcutaneous TID PC,HS,0200   levothyroxine  50 mcg Oral Q0600   polyethylene glycol  17 g Oral Daily   Continuous Infusions:  cefTRIAXone (ROCEPHIN)  IV 1 g (02/21/21 2328)     LOS: 1 day      Time spent: 25 minutes   Dessa Phi, DO [facility] 02/22/2021, 12:48 PM   Available via Epic secure chat 7am-7pm After these hours, please refer to coverage provider listed on [email or web address]

## 2021-02-22 NOTE — Evaluation (Signed)
Occupational Therapy Evaluation Patient Details Name: Hayley Fisher MRN: [id number] DOB: 21-Sep-1930 Today's Date: 02/22/2021    History of Present Illness Patient is an 87 year old female who was living at home with family support after recent transition out of SNF. patient was noted by family to have increased altered mental status and presented to the hospital. patient was found to have urinary tract infection and sepsis. patients past medical history is significant for IDDM, HTN, anemia, dementia, and DDD   Clinical Impression   Patient is an 87 year old female who was admitted for above diagnosis. Patient was noted to have had a significant increase in level of assistance for ADLs and functional mobility from time of discharge during last admission in May. Currently patient is max A x 2 for sit to stand and transfers with rolling walker. Patient was noted to have decreased coordination in right hand and digits with inability to extend into neutral with AROM or PROM. Patient was noted to have increased difficulty with picking up toothbrush and manipulation to complete brushing all teeth.Patient reported no pain with movements. Patient was min A for brushing teeth sitting in recliner in room with noted lateral leaning to L side.   Patient was not able to give full prior level of function. Will need to follow up more with family. Patient would benefit from discharging to SNF for short term rehabilitation at time of d/c to continue to increase independence and to reduce caregiver burden at home.     Follow Up Recommendations  SNF                 Precautions / Restrictions Precautions Precautions: Fall Restrictions Weight Bearing Restrictions: No      Mobility Bed Mobility Overal bed mobility: Needs Assistance Bed Mobility: Sit to Supine       Sit to supine: Min assist        Transfers Overall transfer level: Needs assistance Equipment used: Rolling walker (2  wheeled) Transfers: Stand Pivot Transfers;Sit to/from Stand Sit to Stand: Max assist;+2 physical assistance;+2 safety/equipment Stand pivot transfers: +2 safety/equipment;+2 physical assistance;Max assist            Balance Overall balance assessment: Needs assistance Sitting-balance support: Bilateral upper extremity supported Sitting balance-Leahy Scale: Poor     Standing balance support: Bilateral upper extremity supported Standing balance-Leahy Scale: Poor                             ADL either performed or assessed with clinical judgement   ADL Overall ADL's : Needs assistance/impaired Eating/Feeding: Minimal assistance   Grooming: Minimal assistance;Wash/dry face;Oral care   Upper Body Bathing: Moderate assistance;Bed level   Lower Body Bathing: Total assistance;Bed level   Upper Body Dressing : Moderate assistance;Bed level   Lower Body Dressing: Bed level;Total assistance   Toilet Transfer: +2 for physical assistance;+2 for safety/equipment;Maximal assistance   Toileting- Clothing Manipulation and Hygiene: +2 for safety/equipment;+2 for physical assistance;Maximal assistance       Functional mobility during ADLs: Maximal assistance;+2 for safety/equipment;+2 for physical assistance;Rolling walker       Vision   Vision Assessment?: No apparent visual deficits                Pertinent Vitals/Pain Pain Assessment: No/denies pain     Hand Dominance Right   Extremity/Trunk Assessment Upper Extremity Assessment Upper Extremity Assessment: RUE deficits/detail RUE Deficits / Details: patient was noted to have impaired  ability to extend digits and grasp items. patient was able to grossly hold toothbrush but noted to have difficulties with manipulation. RUE Coordination: decreased fine motor;decreased gross motor   Lower Extremity Assessment Lower Extremity Assessment: Defer to PT evaluation       Communication  Communication Communication: No difficulties   Cognition Arousal/Alertness: Awake/alert Behavior During Therapy: WFL for tasks assessed/performed Overall Cognitive Status: No family/caregiver present to determine baseline cognitive functioning Area of Impairment: Orientation;Following commands;Problem solving                 Orientation Level: Disoriented to;Place;Time;Situation     Following Commands: Follows one step commands inconsistently       General Comments: hx of dementia, per notes, family reports pt able to carry a conversation                    Home Living Family/patient expects to be discharged to:: Private residence Living Arrangements: Other relatives Available Help at Discharge: Family Type of Home: House Home Access: [place]: One level               [place]: Environmental consultant - 4 wheels;Shower seat   Additional Comments: patient was at SNF but has recently transitioned home with family.      Prior Functioning/Environment Level of Independence: Needs assistance  Gait / Transfers Assistance Needed: likely requiring some assist since d/c home from SNF     Comments: pt poor historian; patient reported using a walker        OT Problem List: Decreased strength;Decreased range of motion;Decreased coordination;Decreased activity tolerance;Decreased cognition;Impaired balance (sitting and/or standing);Decreased safety awareness;Impaired UE functional use      OT Treatment/Interventions: Self-care/ADL training;Therapeutic exercise;Patient/family education;Balance training;Therapeutic activities;DME and/or AE instruction    OT Goals(Current goals can be found in the care plan section) Acute Rehab OT Goals Patient Stated Goal: patient wants to go home. OT Goal Formulation: With patient Time For Goal Achievement: 03/08/21 Potential to Achieve Goals: Good  OT Frequency: Min 2X/week                              AM-PAC OT "6 Clicks" Daily Activity     Outcome Measure Help from another person eating meals?: A Little Help from another person taking care of personal grooming?: A Little Help from another person toileting, which includes using toliet, bedpan, or urinal?: A Lot Help from another person bathing (including washing, rinsing, drying)?: A Lot Help from another person to put on and taking off regular upper body clothing?: A Lot Help from another person to put on and taking off regular lower body clothing?: A Lot 6 Click Score: 14   End of Session Equipment Utilized During Treatment: Gait belt;Rolling walker Nurse Communication: Other (comment) (nurse cleared patient to participate in session)  Activity Tolerance:   Patient left: in bed;with call bell/phone within reach;with bed alarm set  OT Visit Diagnosis: Unsteadiness on feet (R26.81);Muscle weakness (generalized) (M62.81)                Time: 3009-2330 OT Time Calculation (min): 30 min Charges:  OT Evaluation $OT Eval Low Complexity: 1 Low OT Treatments $Self Care/Home Management : 8-22 mins  Jackelyn Poling OTR/L, MS Acute Rehabilitation Department Office# [phone] Pager# [phone]   [place] 02/22/2021, 1:01 PM

## 2021-02-22 NOTE — Evaluation (Signed)
Physical Therapy Evaluation Patient Details Name: Hayley Fisher MRN: [id number] DOB: 1931-04-23 Today's Date: 02/22/2021   History of Present Illness  87 yo female currently admitted for acute encephalopathy, UTI.  Past medical history significant for dementia, insulin-dependent diabetes, hypertension, history of DVT status post IVC filter in May 2022, hyperlipidemia, hypothyroidism who presents with acute encephalopathy.  Patient has been in the hospital, SNF many times over the past 6 months.  Clinical Impression  Pt admitted with above diagnosis. Pt currently with functional limitations due to the deficits listed below (see PT Problem List). Pt will benefit from skilled PT to increase their independence and safety with mobility to allow discharge to the venue listed below.  Pt assisted OOB to recliner and currently requiring mod-max assist +2 due to weakness and decreased cognition.  Pt recently at SNF and taken home by family.  Recommend SNF however if family continues to prefer home, pt would benefit from [place] and wheelchair.     Follow Up Recommendations SNF    Equipment Recommendations  Wheelchair cushion (measurements PT);Wheelchair (measurements PT)    Recommendations for Other Services       Precautions / Restrictions Precautions Precautions: Fall Restrictions Weight Bearing Restrictions: No      Mobility  Bed Mobility Overal bed mobility: Needs Assistance Bed Mobility: Supine to Sit     Supine to sit: Mod assist Sit to supine: Min assist   General bed mobility comments: assist for trunk and scooting to EOB    Transfers Overall transfer level: Needs assistance Equipment used: Rolling walker (2 wheeled) Transfers: Sit to/from [facility] Sit to Stand: Max assist;+2 physical assistance Stand pivot transfers: Max assist;+2 physical assistance       General transfer comment: assist to rise and steady, pt with significant left lateral lean and  difficulty gripping on right and lifting/moving Rt LE; assist for weakness and stability  Ambulation/Gait                Stairs            Wheelchair Mobility    Modified Rankin (Stroke Patients Only)       Balance Overall balance assessment: Needs assistance Sitting-balance support: Bilateral upper extremity supported Sitting balance-Leahy Scale: Poor     Standing balance support: Bilateral upper extremity supported Standing balance-Leahy Scale: Poor                               Pertinent Vitals/Pain Pain Assessment: No/denies pain    Home Living Family/patient expects to be discharged to:: Private residence Living Arrangements: Other relatives Available Help at Discharge: Family Type of Home: House Home Access: Ramped entrance     Home Layout: One level Home Equipment: Environmental consultant - 4 wheels;Shower seat Additional Comments: patient was at SNF but has recently transitioned home with family.    Prior Function Level of Independence: Needs assistance   Gait / Transfers Assistance Needed: likely requiring some assist since d/c home from SNF     Comments: pt poor historian; patient reported using a walker     Hand Dominance   Dominant Hand: Right    Extremity/Trunk Assessment   Upper Extremity Assessment Upper Extremity Assessment: RUE deficits/detail RUE Deficits / Details: patient was noted to have impaired ability to extend digits and grasp items. patient was able to grossly hold toothbrush but noted to have difficulties with manipulation. RUE Coordination: decreased fine motor;decreased gross motor  Lower Extremity Assessment Lower Extremity Assessment: Generalized weakness;RLE deficits/detail;Difficult to assess due to impaired cognition RLE Deficits / Details: right LE functionally appears weaker then left, difficulty lifting from floor to transfer       Communication   Communication: No difficulties  Cognition  Arousal/Alertness: Awake/alert Behavior During Therapy: WFL for tasks assessed/performed Overall Cognitive Status: No family/caregiver present to determine baseline cognitive functioning Area of Impairment: Orientation;Following commands;Problem solving                 Orientation Level: Disoriented to;Place;Time;Situation     Following Commands: Follows one step commands inconsistently       General Comments: hx of dementia, per notes, family reports pt able to carry a conversation      General Comments      Exercises     Assessment/Plan    PT Assessment Patient needs continued PT services  PT Problem List Decreased strength;Decreased mobility;Decreased activity tolerance;Decreased balance;Decreased knowledge of use of DME;Decreased cognition       PT Treatment Interventions DME instruction;Therapeutic exercise;Functional mobility training;Therapeutic activities;Patient/family education;Balance training;Gait training    PT Goals (Current goals can be found in the Care Plan section)  Acute Rehab PT Goals Patient Stated Goal: patient wants to go home. PT Goal Formulation: Patient unable to participate in goal setting Time For Goal Achievement: 03/08/21 Potential to Achieve Goals: Fair    Frequency Min 2X/week   Barriers to discharge        Co-evaluation               AM-PAC PT "6 Clicks" Mobility  Outcome Measure Help needed turning from your back to your side while in a flat bed without using bedrails?: A Lot Help needed moving from lying on your back to sitting on the side of a flat bed without using bedrails?: A Lot Help needed moving to and from a bed to a chair (including a wheelchair)?: Total Help needed standing up from a chair using your arms (e.g., wheelchair or bedside chair)?: Total Help needed to walk in hospital room?: Total Help needed climbing 3-5 steps with a railing? : Total 6 Click Score: 8    End of Session Equipment Utilized  During Treatment: Gait belt Activity Tolerance: Patient tolerated treatment well Patient left: with call bell/phone within reach;in chair;with chair alarm set Nurse Communication: Mobility status PT Visit Diagnosis: Other abnormalities of gait and mobility (R26.89)    Time: 2774-1287 PT Time Calculation (min) (ACUTE ONLY): 15 min   Charges:   PT Evaluation $PT Eval Low Complexity: 1 Low        Kati PT, DPT Acute Rehabilitation Services Pager: [phone] Office: [place] 02/22/2021, 1:06 PM

## 2021-02-22 NOTE — Progress Notes (Addendum)
Initial Nutrition Assessment  DOCUMENTATION CODES:   Non-severe (moderate) malnutrition in context of chronic illness  INTERVENTION:  - continue Ensure Enlive po BID, each supplement provides 350 kcal and 20 grams of protein. - change diet from Heart Healthy to Carb Modified.    NUTRITION DIAGNOSIS:   Moderate Malnutrition related to chronic illness as evidenced by moderate fat depletion, moderate muscle depletion, severe muscle depletion.  GOAL:   Patient will meet greater than or equal to 90% of their needs  MONITOR:   PO intake, Supplement acceptance, Labs, Weight trends  REASON FOR ASSESSMENT:   Malnutrition Screening Tool  ASSESSMENT:   87 y.o. female with medical history of cognitive impairment, type 2 DM, HTN, chronic venous insufficiency, history of DVT s/p IVC on 01/16/21, HLD, hypothyroidism, and GERD. She presented to the ED from SNF with AMS.  No intakes documented since admission. No family or visitors present. Patient was eating lunch at the time of RD visit. She is unable to hold a cup, but is able to feed herself if handed utensils and as long as foods are cut into bite-sized pieces for her.   She is noted to be a/o to self only. She denies any abdominal pain or nausea.   Ensure Enlive ordered BID per ONS protocol yesterday and she has accepted all 3 bottles offered to her.   Weight yesterday was 133 lb and weight on 7/2 was documented as 145 lb. Weight had been stable from 09/16/20-01/13/21 and was consistent with weight yesterday.   Per notes: - acute metabolic encephalopathy with hx of underlying dementia - UTI   Labs reviewed; CBGs: 136, 152, 376 mg/dl, Ca: 8.2 mg/dl.  Medications reviewed; sliding scale novolog, 50 mcg oral synthroid/day, 17 g miralax/day.      NUTRITION - FOCUSED PHYSICAL EXAM:  Flowsheet Row Most Recent Value  Orbital Region Moderate depletion  Upper Arm Region Severe depletion  Thoracic and Lumbar Region Moderate depletion   Buccal Region Moderate depletion  Temple Region Mild depletion  Clavicle Bone Region Severe depletion  Clavicle and Acromion Bone Region Severe depletion  Scapular Bone Region Unable to assess  Dorsal Hand Moderate depletion  Patellar Region Moderate depletion  Anterior Thigh Region Severe depletion  Posterior Calf Region Moderate depletion  Edema (RD Assessment) None  Hair Reviewed  Eyes Reviewed  Mouth Reviewed  Skin Reviewed  Nails Reviewed       Diet Order:   Diet Order             Diet Heart Room service appropriate? Yes; Fluid consistency: Thin  Diet effective now                   EDUCATION NEEDS:   No education needs have been identified at this time  Skin:  Skin Assessment: Reviewed RN Assessment  Last BM:  7/3 (type 2 x2)  Height:   Ht Readings from Last 1 Encounters:  02/21/21 5\' 1"  (1.549 m)    Weight:   Wt Readings from Last 1 Encounters:  02/21/21 60.1 kg      Estimated Nutritional Needs:  Kcal:  1300-1500 kcal Protein:  55-65 grams Fluid:  >/= 1.6 L/day     Jarome Matin, MS, RD, LDN, CNSC Inpatient Clinical Dietitian RD pager # available in AMION  After hours/weekend pager # available in [facility]

## 2021-02-22 NOTE — Plan of Care (Signed)
  Problem: Activity: Goal: Risk for activity intolerance will decrease Outcome: Progressing   

## 2021-02-22 NOTE — Progress Notes (Signed)
   02/22/21 1649  Provider Notification  Provider Name/Title Maylene Roes  Date Provider Notified 02/22/21  Time Provider Notified 1649  Notification Type Page  Notification Reason Other (Comment) (CBG 445, 6units SSI given  and notify MD per order)

## 2021-02-23 DIAGNOSIS — E440 Moderate protein-calorie malnutrition: Secondary | ICD-10-CM | POA: Insufficient documentation

## 2021-02-23 HISTORY — DX: Moderate protein-calorie malnutrition: E44.0

## 2021-02-23 LAB — GLUCOSE, CAPILLARY
Glucose-Capillary: 404 mg/dL — ABNORMAL HIGH (ref 70–99)
Glucose-Capillary: 262 mg/dL — ABNORMAL HIGH (ref 70–99)
Glucose-Capillary: 364 mg/dL — ABNORMAL HIGH (ref 70–99)
Glucose-Capillary: 568 mg/dL (ref 70–99)
Glucose-Capillary: 577 mg/dL (ref 70–99)
Glucose-Capillary: 551 mg/dL (ref 70–99)
Glucose-Capillary: 413 mg/dL — ABNORMAL HIGH (ref 70–99)

## 2021-02-23 LAB — URINE CULTURE: Culture: >=100000 — AB

## 2021-02-23 MED ORDER — INSULIN GLARGINE 100 UNIT/ML ~~LOC~~ SOLN
10.0000 [IU] | Freq: Every day | SUBCUTANEOUS | Status: DC
  Administered 2021-02-23: 10 [IU] via SUBCUTANEOUS
  Filled 2021-02-23: qty 0.1

## 2021-02-23 MED ORDER — SODIUM CHLORIDE 0.9 % IV SOLN: 1.0000 g | INTRAVENOUS | Status: DC

## 2021-02-23 MED ORDER — INSULIN ASPART 100 UNIT/ML IJ SOLN
5.0000 [IU] | Freq: Once | INTRAMUSCULAR | Status: AC
  Administered 2021-02-23: 5 [IU] via SUBCUTANEOUS

## 2021-02-23 MED ORDER — INSULIN ASPART 100 UNIT/ML IJ SOLN
0.0000 [IU] | Freq: Every day | INTRAMUSCULAR | Status: DC
  Administered 2021-02-23: 5 [IU] via SUBCUTANEOUS
  Administered 2021-02-24: 4 [IU] via SUBCUTANEOUS
  Administered 2021-02-25: 3 [IU] via SUBCUTANEOUS

## 2021-02-23 MED ORDER — INSULIN ASPART 100 UNIT/ML IJ SOLN
0.0000 [IU] | Freq: Three times a day (TID) | INTRAMUSCULAR | Status: DC
Start: 2021-02-24 — End: 2021-02-27
  Administered 2021-02-24: 9 [IU] via SUBCUTANEOUS
  Administered 2021-02-24: 7 [IU] via SUBCUTANEOUS
  Administered 2021-02-24: 5 [IU] via SUBCUTANEOUS
  Administered 2021-02-25: 3 [IU] via SUBCUTANEOUS
  Administered 2021-02-25: 1 [IU] via SUBCUTANEOUS
  Administered 2021-02-26 (×2): 2 [IU] via SUBCUTANEOUS
  Administered 2021-02-26: 5 [IU] via SUBCUTANEOUS

## 2021-02-23 MED ORDER — FOSFOMYCIN TROMETHAMINE 3 G PO PACK
3.0000 g | PACK | Freq: Once | ORAL | Status: AC
  Administered 2021-02-23: 3 g via ORAL
  Filled 2021-02-23: qty 3

## 2021-02-23 MED ORDER — INSULIN ASPART 100 UNIT/ML IJ SOLN
11.0000 [IU] | Freq: Once | INTRAMUSCULAR | Status: AC
  Administered 2021-02-23: 11 [IU] via SUBCUTANEOUS

## 2021-02-23 NOTE — Progress Notes (Signed)
[facility] room [address] [facility]) Hospital Liaison note:  This patient is currently enrolled in [facility] outpatient-based Palliative Care. Will continue to follow for disposition.  Please call with any outpatient palliative questions or concerns.  Thank you, Lorelee Market, LPN [facility] Liaison [phone]

## 2021-02-23 NOTE — Progress Notes (Addendum)
PROGRESS NOTE    Hayley Fisher  PJK:[id number] DOB: July 16, 1931 DOA: 02/20/2021 PCP: Program, [facility]     Brief Narrative:  Hayley Fisher is an 87 yo female with medical history significant for dementia, insulin-dependent diabetes, hypertension, history of DVT status post IVC filter in May 2022, hyperlipidemia, hypothyroidism who presents with acute encephalopathy.  Patient has been in the hospital, SNF many times over the past 6 months.  She is currently admitted for acute encephalopathy, UTI.   New events last 24 hours / Subjective: Patient in bed this morning, no physical complaints.  Remains disoriented, not oriented to place or month/year.  She is more interactive than previous examinations.  Assessment & Plan:   Active Problems:   Hyperlipidemia associated with type 2 diabetes mellitus ([place])   Hypertension associated with diabetes ([place])   Hypothyroidism   Sepsis ([place])   AMS (altered mental status)   Cognitive impairment   Acute metabolic encephalopathy   Malnutrition of moderate degree   Acute metabolic encephalopathy on underlying dementia -Waxing and waning mentation, likely to worsen with age/dementia -Delirium precaution  Klebsiella UTI, ESBL, present on admission -Switch Rocephin to fosfomycin  -Blood cultures negative to date  Diabetes mellitus -Sliding scale insulin  Hypertension -Continue enalapril  Hyperlipidemia -Continue Lipitor  Hypothyroidism -Continue Synthroid  History of bilateral lower extremity DVT -IVC filter placed 01/16/2021     DVT prophylaxis:  enoxaparin (LOVENOX) injection 40 mg Start: 02/21/21 1000  Code Status:     Code Status Orders  (From admission, onward)           Start     Ordered   02/21/21 0017  Full code  Continuous        02/21/21 0017           Code Status History     Date Active Date Inactive Code Status Order ID Comments User Context   01/15/2021 0122 01/21/2021 0822  Full Code [id number]  Toy Baker, MD ED   12/09/2020 0617 12/15/2020 1812 Full Code [id number]  Vianne Bulls, MD ED   07/03/2020 0050 07/06/2020 2358 Full Code [id number]  Chotiner, Yevonne Aline, MD Inpatient      Family Communication: None at bedside Disposition Plan:  Status is: Inpatient  Remains inpatient appropriate because:IV treatments appropriate due to intensity of illness or inability to take PO  Dispo: The patient is from: Home              Anticipated d/c is to: Home vs SNF               Patient currently is not medically stable to d/c.   Difficult to place patient No      Antimicrobials:  Anti-infectives (From admission, onward)    Start     Dose/Rate Route Frequency Ordered Stop   02/23/21 1145  ceFEPIme (MAXIPIME) 1 g in sodium chloride 0.9 % 100 mL IVPB        1 g 200 mL/hr over 30 Minutes Intravenous Every 24 hours 02/23/21 1048     02/22/21 0000  cefTRIAXone (ROCEPHIN) 1 g in sodium chloride 0.9 % 100 mL IVPB  Status:  Discontinued        1 g 200 mL/hr over 30 Minutes Intravenous Every 24 hours 02/21/21 0017 02/23/21 1048   02/20/21 2230  cefTRIAXone (ROCEPHIN) 1 g in sodium chloride 0.9 % 100 mL IVPB        1 g 200 mL/hr over 30 Minutes Intravenous  Once 02/20/21 2220 02/20/21 2340        Objective: Vitals:   02/22/21 0442 02/22/21 1225 02/22/21 2148 02/23/21 0548  BP: 135/67 131/70 (!) 145/72 140/80  Pulse: (!) 109 (!) 107 (!) 110 (!) 105  Resp: 16 16 18 18   Temp: 98 F (36.7 C) 98.6 F (37 C) 99.5 F (37.5 C) 98.2 F (36.8 C)  TempSrc:  Oral Oral Oral  SpO2: 97% 95% 99% 99%  Weight:      Height:        Intake/Output Summary (Last 24 hours) at 02/23/2021 1049 Last data filed at 02/23/2021 0600 Gross per 24 hour  Intake 720.06 ml  Output 2100 ml  Net -1379.94 ml    Filed Weights   02/20/21 2057 02/20/21 2107 02/21/21 0119  Weight: 65.8 kg 65.7 kg 60.1 kg   Examination: General exam: Appears calm and comfortable  Respiratory  system: Clear to auscultation. Respiratory effort normal. Cardiovascular system: S1 & S2 heard, RRR. No pedal edema. Gastrointestinal system: Abdomen is nondistended, soft and nontender. Normal bowel sounds heard. Central nervous system: Alert and oriented to self only  Extremities: Symmetric in appearance bilaterally  Skin: No rashes, lesions or ulcers on exposed skin  Psychiatry: Judgement and insight appear poor. Dementia.    Data Reviewed: I have personally reviewed following labs and imaging studies  CBC: Recent Labs  Lab 02/20/21 2229 02/21/21 0605 02/22/21 0507  WBC 5.1 4.7 4.8  NEUTROABS 3.7  --   --   HGB 11.6* 11.2* 10.8*  HCT 35.4* 34.7* 33.3*  MCV 95.2 95.3 94.6  PLT 270 276 517    Basic Metabolic Panel: Recent Labs  Lab 02/20/21 2229 02/21/21 0605 02/22/21 0507  NA 131* 133* 137  K 4.0 3.8 4.2  CL 95* 97* 101  CO2 26 26 29   GLUCOSE 167* 170* 150*  BUN 23 19 19   CREATININE 0.73 0.78 0.66  CALCIUM 8.2* 8.2* 8.2*    GFR: Estimated Creatinine Clearance: 41.2 mL/min (by C-G formula based on SCr of 0.66 mg/dL). Liver Function Tests: Recent Labs  Lab 02/20/21 2229  AST 14*  ALT 13  ALKPHOS 66  BILITOT 0.5  PROT 6.2*  ALBUMIN 2.5*    No results for input(s): LIPASE, AMYLASE in the last 168 hours. No results for input(s): AMMONIA in the last 168 hours. Coagulation Profile: Recent Labs  Lab 02/20/21 2229  INR 1.1    Cardiac Enzymes: No results for input(s): CKTOTAL, CKMB, CKMBINDEX, TROPONINI in the last 168 hours. BNP (last 3 results) No results for input(s): PROBNP in the last 8760 hours. HbA1C: No results for input(s): HGBA1C in the last 72 hours. CBG: Recent Labs  Lab 02/22/21 1740 02/22/21 2150 02/22/21 2236 02/23/21 0147 02/23/21 0731  GLUCAP 410* 445* 439* 404* 262*    Lipid Profile: No results for input(s): CHOL, HDL, LDLCALC, TRIG, CHOLHDL, LDLDIRECT in the last 72 hours. Thyroid Function Tests: No results for input(s):  TSH, T4TOTAL, FREET4, T3FREE, THYROIDAB in the last 72 hours. Anemia Panel: No results for input(s): VITAMINB12, FOLATE, FERRITIN, TIBC, IRON, RETICCTPCT in the last 72 hours. Sepsis Labs: Recent Labs  Lab 02/20/21 2229 02/21/21 0250 02/21/21 0605  LATICACIDVEN 2.3* 1.7 1.0     Recent Results (from the past 240 hour(s))  Urine culture     Status: Abnormal   Collection Time: 02/20/21  9:18 PM   Specimen: Urine, Catheterized  Result Value Ref Range Status   Specimen Description   Final    URINE,  CATHETERIZED Performed at [place] [address] [place], [place] [address]    Special Requests   Final    NONE Performed at [facility], [place] [address], [place], [place] [address]    Culture (A)  Final    >=100,000 COLONIES/mL KLEBSIELLA PNEUMONIAE Confirmed Extended Spectrum Beta-Lactamase Producer (ESBL).  In bloodstream infections from ESBL organisms, carbapenems are preferred over piperacillin/tazobactam. They are shown to have a lower risk of mortality.    Report Status 02/23/2021 FINAL  Final   Organism ID, Bacteria KLEBSIELLA PNEUMONIAE (A)  Final      Susceptibility   Klebsiella pneumoniae - MIC*    AMPICILLIN >=32 RESISTANT Resistant     CEFAZOLIN >=64 RESISTANT Resistant     CEFEPIME 2 SENSITIVE Sensitive     CEFTRIAXONE >=64 RESISTANT Resistant     CIPROFLOXACIN 2 INTERMEDIATE Intermediate     GENTAMICIN >=16 RESISTANT Resistant     IMIPENEM <=0.25 SENSITIVE Sensitive     NITROFURANTOIN 128 RESISTANT Resistant     TRIMETH/SULFA >=320 RESISTANT Resistant     AMPICILLIN/SULBACTAM >=32 RESISTANT Resistant     PIP/TAZO 8 SENSITIVE Sensitive     * >=100,000 COLONIES/mL KLEBSIELLA PNEUMONIAE  Blood Culture (routine x 2)     Status: None (Preliminary result)   Collection Time: 02/20/21 10:29 PM   Specimen: BLOOD  Result Value Ref Range Status   Specimen Description   Final    BLOOD BLOOD RIGHT FOREARM Performed at [place] [address], [place], [place] [address]    Special Requests   Final    BOTTLES DRAWN AEROBIC ONLY Blood Culture adequate volume Performed at [place] [address] [place], [place] [address]    Culture   Final    NO GROWTH 2 DAYS Performed at [facility] [address] [place], [place] [address]    Report Status PENDING  Incomplete  Blood Culture (routine x 2)     Status: None (Preliminary result)   Collection Time: 02/20/21 10:29 PM   Specimen: BLOOD  Result Value Ref Range Status   Specimen Description   Final    BLOOD BLOOD RIGHT FOREARM Performed at [facility] [address], [place], [place] [address]    Special Requests   Final    ANAEROBIC BOTTLE ONLY Blood Culture results may not be optimal due to an inadequate volume of blood received in culture bottles Performed at [facility] [address] [place],  [address]    Culture   Final    NO GROWTH 2 DAYS Performed at [facility] [address], [place],  [address]    Report Status PENDING  Incomplete  Resp Panel by RT-PCR (Flu A&B, Covid) Nasopharyngeal Swab     Status: None   Collection Time: 02/20/21 11:00 PM   Specimen: Nasopharyngeal Swab; Nasopharyngeal(NP) swabs in vial transport medium  Result Value Ref Range Status   SARS Coronavirus 2 by RT PCR NEGATIVE NEGATIVE Final    Comment: (NOTE) SARS-CoV-2 target nucleic acids are NOT DETECTED.  The SARS-CoV-2 RNA is generally detectable in upper respiratory specimens during the acute phase of infection. The lowest concentration of SARS-CoV-2 viral copies this assay can detect is 138 copies/mL. A negative result does not preclude SARS-Cov-2 infection and should not be used as the sole basis for treatment or other patient management decisions. A negative result may occur with  improper specimen collection/handling,  submission of specimen  other than nasopharyngeal swab, presence of viral mutation(s) within the areas targeted by this assay, and inadequate number of viral copies(<138 copies/mL). A negative result must be combined with clinical observations, patient history, and epidemiological information. The expected result is Negative.  Fact Sheet for Patients:  EntrepreneurPulse.com.au  Fact Sheet for Healthcare Providers:  IncredibleEmployment.be  This test is no t yet approved or cleared by the [place] FDA and  has been authorized for detection and/or diagnosis of SARS-CoV-2 by FDA under an Emergency Use Authorization (EUA). This EUA will remain  in effect (meaning this test can be used) for the duration of the COVID-19 declaration under Section 564(b)(1) of the Act, 21 U.S.C.section 360bbb-3(b)(1), unless the authorization is terminated  or revoked sooner.       Influenza A by PCR NEGATIVE NEGATIVE Final   Influenza B by PCR NEGATIVE NEGATIVE Final    Comment: (NOTE) The Xpert Xpress SARS-CoV-2/FLU/RSV plus assay is intended as an aid in the diagnosis of influenza from Nasopharyngeal swab specimens and should not be used as a sole basis for treatment. Nasal washings and aspirates are unacceptable for Xpert Xpress SARS-CoV-2/FLU/RSV testing.  Fact Sheet for Patients: EntrepreneurPulse.com.au  Fact Sheet for Healthcare Providers: IncredibleEmployment.be  This test is not yet approved or cleared by the [place] FDA and has been authorized for detection and/or diagnosis of SARS-CoV-2 by FDA under an Emergency Use Authorization (EUA). This EUA will remain in effect (meaning this test can be used) for the duration of the COVID-19 declaration under Section 564(b)(1) of the Act, 21 U.S.C. section 360bbb-3(b)(1), unless the authorization is terminated or revoked.  Performed at [facility], [place] [address], [place], [place] [address]        Radiology Studies: No results found.    Scheduled Meds:  aspirin  81 mg Oral q morning   atorvastatin  40 mg Oral Daily   enalapril  20 mg Oral Daily   enoxaparin (LOVENOX) injection  40 mg Subcutaneous Q24H   feeding supplement  237 mL Oral BID BM   insulin aspart  0-6 Units Subcutaneous TID PC,HS,0200   levothyroxine  50 mcg Oral Q0600   polyethylene glycol  17 g Oral Daily   Continuous Infusions:  ceFEPime (MAXIPIME) IV       LOS: 2 days      Time spent: 20 minutes   Dessa Phi, DO [facility] 02/23/2021, 10:49 AM   Available via Epic secure chat 7am-7pm After these hours, please refer to coverage provider listed on [email or web address]

## 2021-02-23 NOTE — Progress Notes (Signed)
Inpatient Diabetes Program Recommendations  AACE/ADA: New Consensus Statement on Inpatient Glycemic Control (2015)  Target Ranges:  Prepandial:   less than 140 mg/dL      Peak postprandial:   less than 180 mg/dL (1-2 hours)      Critically ill patients:  140 - 180 mg/dL   Lab Results  Component Value Date   GLUCAP 364 (H) 02/23/2021   HGBA1C 6.4 (H) 01/14/2021    Review of Glycemic Control  Diabetes history: DM2 Outpatient Diabetes medications: Lantus 15 units QHS, Humalog 2-15 units TID, metformin 1000 mg QD, Rybelsus 3 mg QD Current orders for Inpatient glycemic control: Novolog 0-6 units TID with meals and 0-5 HS  HgbA1C 6.4% Needs part of home basal insulin  Inpatient Diabetes Program Recommendations:    Add Lantus 10 units QHS Increase Novolog to 0-9 units TID with meals and 0-5 HS  Will follow glucose trends.  Thank you. Lorenda Peck, RD, LDN, CDE Inpatient Diabetes Coordinator [phone]

## 2021-02-23 NOTE — Progress Notes (Signed)
Critical sugar of 568.  Notified Dr. Maylene Roes.  Order placed to give 11 units of insulin.  Recheck one hour later after 11 units given.  Sugar reading of 551.  MD made aware.

## 2021-02-24 ENCOUNTER — Encounter (HOSPITAL_COMMUNITY): Payer: Self-pay | Admitting: Internal Medicine

## 2021-02-24 ENCOUNTER — Telehealth: Payer: Self-pay

## 2021-02-24 LAB — CBC
HCT: 36.2 % (ref 36.0–46.0)
Hemoglobin: 11.7 g/dL — ABNORMAL LOW (ref 12.0–15.0)
MCH: 30.5 pg (ref 26.0–34.0)
MCHC: 32.3 g/dL (ref 30.0–36.0)
MCV: 94.5 fL (ref 80.0–100.0)
Platelets: 334 10*3/uL (ref 150–400)
RBC: 3.83 MIL/uL — ABNORMAL LOW (ref 3.87–5.11)
RDW: 14.9 % (ref 11.5–15.5)
WBC: 5.1 10*3/uL (ref 4.0–10.5)
nRBC: 0 % (ref 0.0–0.2)

## 2021-02-24 LAB — GLUCOSE, CAPILLARY
Glucose-Capillary: 330 mg/dL — ABNORMAL HIGH (ref 70–99)
Glucose-Capillary: 429 mg/dL — ABNORMAL HIGH (ref 70–99)
Glucose-Capillary: 292 mg/dL — ABNORMAL HIGH (ref 70–99)
Glucose-Capillary: 305 mg/dL — ABNORMAL HIGH (ref 70–99)

## 2021-02-24 LAB — BASIC METABOLIC PANEL
Anion gap: 8 (ref 5–15)
BUN: 24 mg/dL — ABNORMAL HIGH (ref 8–23)
CO2: 31 mmol/L (ref 22–32)
Calcium: 8.2 mg/dL — ABNORMAL LOW (ref 8.9–10.3)
Chloride: 97 mmol/L — ABNORMAL LOW (ref 98–111)
Creatinine, Ser: 0.98 mg/dL (ref 0.44–1.00)
GFR, Estimated: 56 mL/min — ABNORMAL LOW (ref >60)
Glucose, Bld: 384 mg/dL — ABNORMAL HIGH (ref 70–99)
Potassium: 4.5 mmol/L (ref 3.5–5.1)
Sodium: 136 mmol/L (ref 135–145)

## 2021-02-24 MED ORDER — INSULIN GLARGINE 100 UNIT/ML ~~LOC~~ SOLN
10.0000 [IU] | Freq: Two times a day (BID) | SUBCUTANEOUS | Status: DC
  Administered 2021-02-24 (×2): 10 [IU] via SUBCUTANEOUS
  Filled 2021-02-24 (×3): qty 0.1

## 2021-02-24 MED ORDER — INSULIN ASPART 100 UNIT/ML IJ SOLN
4.0000 [IU] | Freq: Three times a day (TID) | INTRAMUSCULAR | Status: DC
  Administered 2021-02-24 – 2021-02-26 (×7): 4 [IU] via SUBCUTANEOUS

## 2021-02-24 MED ORDER — SODIUM CHLORIDE 0.9 % IV SOLN: INTRAVENOUS | Status: AC

## 2021-02-24 NOTE — Telephone Encounter (Signed)
Daughter LVM on nurse line stating the patient should be discharged from the hospital "any day now." Daughter would like an order for a home hospital bed. Daughter reports she would like this faxed to [facility]. Please place DME order.

## 2021-02-24 NOTE — Progress Notes (Signed)
Occupational Therapy Treatment Patient Details Name: Hayley Fisher MRN: [id number] DOB: [birth year] Today's Date: 02/24/2021    History of present illness 87 yo female currently admitted for acute encephalopathy, UTI.  Past medical history significant for dementia, insulin-dependent diabetes, hypertension, history of DVT status post IVC filter in May 2022, hyperlipidemia, hypothyroidism who presents with acute encephalopathy.  Patient has been in the hospital, SNF many times over the past 6 months.   OT comments  Patient required less physical assistance for transfers today (mod x 2 instead of max x 2) but total assist for toileting after found to be incontinent with standing. Continue POC.   Follow Up Recommendations  SNF    Equipment Recommendations  None recommended by OT    Recommendations for Other Services      Precautions / Restrictions Precautions Precautions: Fall Restrictions Weight Bearing Restrictions: No       Mobility Bed Mobility Overal bed mobility: Needs Assistance Bed Mobility: Supine to Sit     Supine to sit: +2 for safety/equipment;HOB elevated;Max assist     General bed mobility comments: assist for trunk and scooting to EOB and guiding LEs to edge of bed    Transfers Overall transfer level: Needs assistance Equipment used: Rolling walker (2 wheeled)   Sit to Stand: +2 physical assistance;Mod assist;From elevated surface Stand pivot transfers: Mod assist;+2 physical assistance;+2 safety/equipment       General transfer comment: MOd x 2 to rise twice from bed for pericare and then to transfer to recliner. Increased time to power up. Able to take steps to recliner holding onto walker wiht mod x 2 but not using upper extremities effectively on walker.    Balance Overall balance assessment: Needs assistance Sitting-balance support: No upper extremity supported Sitting balance-Leahy Scale: Fair Sitting balance - Comments: at edge of bed    Standing balance support: Bilateral upper extremity supported;During functional activity Standing balance-Leahy Scale: Poor Standing balance comment: requires external assistance                           ADL either performed or assessed with clinical judgement   ADL Overall ADL's : Needs assistance/impaired                             Toileting- Clothing Manipulation and Hygiene: Total assistance;+2 for physical assistance;+2 for safety/equipment Toileting - Clothing Manipulation Details (indicate cue type and reason): Patient incontinent of BM. Total assist for clean up.             Vision Patient Visual Report: No change from baseline     Perception     Praxis      Cognition Arousal/Alertness: Awake/alert Behavior During Therapy: WFL for tasks assessed/performed Overall Cognitive Status: No family/caregiver present to determine baseline cognitive functioning                                 General Comments: hx of dementia, per notes, family reports pt able to carry a conversation        Exercises     Shoulder Instructions       General Comments      Pertinent Vitals/ Pain       Pain Assessment: Faces Faces Pain Scale: Hurts little more Pain Location: sacrum - with legs elevated. Pain Descriptors / Indicators: Grimacing;Other (Comment) Pain Intervention(s): Limited  activity within patient's tolerance  Home Living                                          Prior Functioning/Environment              Frequency  Min 2X/week        Progress Toward Goals  OT Goals(current goals can now be found in the care plan section)  Progress towards OT goals: Progressing toward goals  Acute Rehab OT Goals OT Goal Formulation: Patient unable to participate in goal setting Time For Goal Achievement: 03/08/21 Potential to Achieve Goals: [place] Discharge plan remains appropriate    Co-evaluation                  AM-PAC OT "6 Clicks" Daily Activity     Outcome Measure   Help from another person eating meals?: A Little Help from another person taking care of personal grooming?: A Little Help from another person toileting, which includes using toliet, bedpan, or urinal?: Total Help from another person bathing (including washing, rinsing, drying)?: A Lot Help from another person to put on and taking off regular upper body clothing?: A Lot Help from another person to put on and taking off regular lower body clothing?: Total 6 Click Score: 12    End of Session Equipment Utilized During Treatment: Gait belt;Rolling walker  OT Visit Diagnosis: Unsteadiness on feet (R26.81);Muscle weakness (generalized) (M62.81)   Activity Tolerance Patient tolerated treatment well   Patient Left with call bell/phone within reach;with chair alarm set;in chair   Nurse Communication Mobility status        Time: 1045-1106 OT Time Calculation (min): 21 min  Charges: OT General Charges $OT Visit: 1 Visit OT Treatments $Self Care/Home Management : 8-22 mins  Derl Barrow, OTR/L [place]  Office [phone] Pager: [place] 02/24/2021, 12:14 PM

## 2021-02-24 NOTE — Progress Notes (Addendum)
Progress Note    Hayley Fisher  KWI:[id number] DOB: 07/28/1931  DOA: 02/20/2021 PCP: Delora Fuel, MD      Brief Narrative:    Medical records reviewed and are as summarized below:  Hayley Fisher is a 87 y.o. female with medical history significant for dementia, insulin-dependent diabetes, hypertension, history of DVT status post IVC filter in May 2022, hyperlipidemia, hypothyroidism who presented to the hospital with altered mental status.  Lactic acid was 2.1.  She was admitted to the hospital for severe sepsis secondary to acute UTI.  Urine culture showed ESBL Klebsiella pneumonia.  She was treated with empiric antibiotics and IV fluids.      Assessment/Plan:   Active Problems:   Hyperlipidemia associated with type 2 diabetes mellitus ([place])   Hypertension associated with diabetes ([place])   Hypothyroidism   Sepsis ([place])   AMS (altered mental status)   Cognitive impairment   Acute metabolic encephalopathy   Malnutrition of moderate degree   Nutrition Problem: Moderate Malnutrition Etiology: chronic illness  Signs/Symptoms: moderate fat depletion, moderate muscle depletion, severe muscle depletion   Body mass index is 25.05 kg/m.   ESBL Klebsiella severe sepsis secondary to UTI: Completed Rocephin and 1 dose of fosfomycin on 02/23/2021.  No growth on blood cultures thus far.  She had fever yesterday.  Monitor fever curve.  Acute metabolic encephalopathy with underlying dementia: Her mental status appears to be at baseline.  IDDM with severe hyperglycemia and dehydration: Increase Lantus from 10 units nightly to 10 units twice daily.  Add NovoLog 4 units 3 times daily with meals and continue sliding scale as needed.  Start IV normal saline for hydration.  Other comorbidities include hypertension, hyperlipidemia, hypothyroidism, history of bilateral lower extremity DVT s/p IVC filter placed on 01/16/2021.      Diet Order             Diet Carb Modified Fluid  consistency: Thin; Room service appropriate? Yes  Diet effective now                      Consultants: None  Procedures: None    Medications:    aspirin  81 mg Oral q morning   atorvastatin  40 mg Oral Daily   enalapril  20 mg Oral Daily   enoxaparin (LOVENOX) injection  40 mg Subcutaneous Q24H   insulin aspart  0-5 Units Subcutaneous QHS   insulin aspart  0-9 Units Subcutaneous TID WC   insulin glargine  10 Units Subcutaneous BID   levothyroxine  50 mcg Oral Q0600   polyethylene glycol  17 g Oral Daily   Continuous Infusions:   Anti-infectives (From admission, onward)    Start     Dose/Rate Route Frequency Ordered Stop   02/23/21 1200  fosfomycin (MONUROL) packet 3 g        3 g Oral  Once 02/23/21 1109 02/23/21 1406   02/23/21 1145  ceFEPIme (MAXIPIME) 1 g in sodium chloride 0.9 % 100 mL IVPB  Status:  Discontinued        1 g 200 mL/hr over 30 Minutes Intravenous Every 24 hours 02/23/21 1048 02/23/21 1105   02/22/21 0000  cefTRIAXone (ROCEPHIN) 1 g in sodium chloride 0.9 % 100 mL IVPB  Status:  Discontinued        1 g 200 mL/hr over 30 Minutes Intravenous Every 24 hours 02/21/21 0017 02/23/21 1048   02/20/21 2230  cefTRIAXone (ROCEPHIN) 1 g in sodium chloride 0.9 %  100 mL IVPB        1 g 200 mL/hr over 30 Minutes Intravenous  Once 02/20/21 2220 02/20/21 2340              Family Communication/Anticipated D/C date and plan/Code Status   DVT prophylaxis: enoxaparin (LOVENOX) injection 40 mg Start: 02/21/21 1000     Code Status: Full Code  Family Communication: None Disposition Plan:    Status is: Inpatient  Remains inpatient appropriate because:Inpatient level of care appropriate due to severity of illness  Dispo: The patient is from: Home              Anticipated d/c is to: SNF              Patient currently is not medically stable to d/c.   Difficult to place patient No           Subjective:   Interval events noted.  She  complains of thirst and she asked for water to drink.  Her urine looks dark.  From chart review, her glucose levels have been very high  Objective:    Vitals:   02/24/21 0106 02/24/21 0504 02/24/21 0842 02/24/21 1211  BP: 130/72 (!) 144/80 139/82 112/66  Pulse: (!) 120 (!) 115 (!) 113 (!) 114  Resp:   20 18  Temp: 99.2 F (37.3 C) 98 F (36.7 C) 98.9 F (37.2 C) 97.7 F (36.5 C)  TempSrc: Oral Oral Oral Oral  SpO2: 98% 100% 100% 100%  Weight:      Height:       No data found.   Intake/Output Summary (Last 24 hours) at 02/24/2021 1337 Last data filed at 02/24/2021 1230 Gross per 24 hour  Intake 840 ml  Output 1700 ml  Net -860 ml   Filed Weights   02/20/21 2057 02/20/21 2107 02/21/21 0119  Weight: 65.8 kg 65.7 kg 60.1 kg    Exam:  GEN: NAD SKIN: Warm and dry EYES: EOMI ENT: MMM CV: RRR, tachycardic PULM: CTA B ABD: soft, ND, NT, +BS CNS: AAO x 2 (person and place), non focal EXT: No edema or tenderness       Data Reviewed:   I have personally reviewed following labs and imaging studies:  Labs: Labs show the following:   Basic Metabolic Panel: Recent Labs  Lab 02/20/21 2229 02/21/21 0605 02/22/21 0507 02/24/21 0414  NA 131* 133* 137 136  K 4.0 3.8 4.2 4.5  CL 95* 97* 101 97*  CO2 26 26 29 31   GLUCOSE 167* 170* 150* 384*  BUN 23 19 19  24*  CREATININE 0.73 0.78 0.66 0.98  CALCIUM 8.2* 8.2* 8.2* 8.2*   GFR Estimated Creatinine Clearance: 33.6 mL/min (by C-G formula based on SCr of 0.98 mg/dL). Liver Function Tests: Recent Labs  Lab 02/20/21 2229  AST 14*  ALT 13  ALKPHOS 66  BILITOT 0.5  PROT 6.2*  ALBUMIN 2.5*   No results for input(s): LIPASE, AMYLASE in the last 168 hours. No results for input(s): AMMONIA in the last 168 hours. Coagulation profile Recent Labs  Lab 02/20/21 2229  INR 1.1    CBC: Recent Labs  Lab 02/20/21 2229 02/21/21 0605 02/22/21 0507 02/24/21 0414  WBC 5.1 4.7 4.8 5.1  NEUTROABS 3.7  --   --   --    HGB 11.6* 11.2* 10.8* 11.7*  HCT 35.4* 34.7* 33.3* 36.2  MCV 95.2 95.3 94.6 94.5  PLT 270 276 305 334   Cardiac Enzymes: No results for  input(s): CKTOTAL, CKMB, CKMBINDEX, TROPONINI in the last 168 hours. BNP (last 3 results) No results for input(s): PROBNP in the last 8760 hours. CBG: Recent Labs  Lab 02/23/21 1707 02/23/21 1822 02/23/21 2117 02/24/21 0710 02/24/21 1131  GLUCAP 568* 551* 413* 330* 429*   D-Dimer: No results for input(s): DDIMER in the last 72 hours. Hgb A1c: No results for input(s): HGBA1C in the last 72 hours. Lipid Profile: No results for input(s): CHOL, HDL, LDLCALC, TRIG, CHOLHDL, LDLDIRECT in the last 72 hours. Thyroid function studies: No results for input(s): TSH, T4TOTAL, T3FREE, THYROIDAB in the last 72 hours.  Invalid input(s): FREET3 Anemia work up: No results for input(s): VITAMINB12, FOLATE, FERRITIN, TIBC, IRON, RETICCTPCT in the last 72 hours. Sepsis Labs: Recent Labs  Lab 02/20/21 2229 02/21/21 0250 02/21/21 0605 02/22/21 0507 02/24/21 0414  WBC 5.1  --  4.7 4.8 5.1  LATICACIDVEN 2.3* 1.7 1.0  --   --     Microbiology Recent Results (from the past 240 hour(s))  Urine culture     Status: Abnormal   Collection Time: 02/20/21  9:18 PM   Specimen: Urine, Catheterized  Result Value Ref Range Status   Specimen Description   Final    URINE, CATHETERIZED Performed at [facility], [place] [address] [place], [place] [address]    Special Requests   Final    NONE Performed at [facility], [place] [address], [place], [place] [address]    Culture (A)  Final    >=100,000 COLONIES/mL KLEBSIELLA PNEUMONIAE Confirmed Extended Spectrum Beta-Lactamase Producer (ESBL).  In bloodstream infections from ESBL organisms, carbapenems are preferred over piperacillin/tazobactam. They are shown to have a lower risk of mortality.    Report Status 02/23/2021 FINAL  Final   Organism ID, Bacteria KLEBSIELLA PNEUMONIAE  (A)  Final      Susceptibility   Klebsiella pneumoniae - MIC*    AMPICILLIN >=32 RESISTANT Resistant     CEFAZOLIN >=64 RESISTANT Resistant     CEFEPIME 2 SENSITIVE Sensitive     CEFTRIAXONE >=64 RESISTANT Resistant     CIPROFLOXACIN 2 INTERMEDIATE Intermediate     GENTAMICIN >=16 RESISTANT Resistant     IMIPENEM <=0.25 SENSITIVE Sensitive     NITROFURANTOIN 128 RESISTANT Resistant     TRIMETH/SULFA >=320 RESISTANT Resistant     AMPICILLIN/SULBACTAM >=32 RESISTANT Resistant     PIP/TAZO 8 SENSITIVE Sensitive     * >=100,000 COLONIES/mL KLEBSIELLA PNEUMONIAE  Blood Culture (routine x 2)     Status: None (Preliminary result)   Collection Time: 02/20/21 10:29 PM   Specimen: BLOOD  Result Value Ref Range Status   Specimen Description   Final    BLOOD BLOOD RIGHT FOREARM Performed at [place] [address], [place], [place] [address]    Special Requests   Final    BOTTLES DRAWN AEROBIC ONLY Blood Culture adequate volume Performed at [place] [address] [place], [place] [address]    Culture   Final    NO GROWTH 3 DAYS Performed at [facility], [place] [address] [place], [place] [address]    Report Status PENDING  Incomplete  Blood Culture (routine x 2)     Status: None (Preliminary result)   Collection Time: 02/20/21 10:29 PM   Specimen: BLOOD  Result Value Ref Range Status   Specimen Description   Final    BLOOD BLOOD RIGHT FOREARM Performed at [facility]  [address] [place], [place] [address]    Special Requests   Final    ANAEROBIC BOTTLE ONLY Blood Culture results may not be optimal due to an inadequate volume of blood received in culture bottles Performed at [place] [address] [place], [place] [address]    Culture   Final    NO GROWTH 3 DAYS Performed at [facility], [place] [address], [place], [place] [address]    Report Status PENDING  Incomplete   Resp Panel by RT-PCR (Flu A&B, Covid) Nasopharyngeal Swab     Status: None   Collection Time: 02/20/21 11:00 PM   Specimen: Nasopharyngeal Swab; Nasopharyngeal(NP) swabs in vial transport medium  Result Value Ref Range Status   SARS Coronavirus 2 by RT PCR NEGATIVE NEGATIVE Final    Comment: (NOTE) SARS-CoV-2 target nucleic acids are NOT DETECTED.  The SARS-CoV-2 RNA is generally detectable in upper respiratory specimens during the acute phase of infection. The lowest concentration of SARS-CoV-2 viral copies this assay can detect is 138 copies/mL. A negative result does not preclude SARS-Cov-2 infection and should not be used as the sole basis for treatment or other patient management decisions. A negative result may occur with  improper specimen collection/handling, submission of specimen other than nasopharyngeal swab, presence of viral mutation(s) within the areas targeted by this assay, and inadequate number of viral copies(<138 copies/mL). A negative result must be combined with clinical observations, patient history, and epidemiological information. The expected result is Negative.  Fact Sheet for Patients:  EntrepreneurPulse.com.au  Fact Sheet for Healthcare Providers:  IncredibleEmployment.be  This test is no t yet approved or cleared by the [place] FDA and  has been authorized for detection and/or diagnosis of SARS-CoV-2 by FDA under an Emergency Use Authorization (EUA). This EUA will remain  in effect (meaning this test can be used) for the duration of the COVID-19 declaration under Section 564(b)(1) of the Act, 21 U.S.C.section 360bbb-3(b)(1), unless the authorization is terminated  or revoked sooner.       Influenza A by PCR NEGATIVE NEGATIVE Final   Influenza B by PCR NEGATIVE NEGATIVE Final    Comment: (NOTE) The Xpert Xpress SARS-CoV-2/FLU/RSV plus assay is intended as an aid in the diagnosis of influenza from  Nasopharyngeal swab specimens and should not be used as a sole basis for treatment. Nasal washings and aspirates are unacceptable for Xpert Xpress SARS-CoV-2/FLU/RSV testing.  Fact Sheet for Patients: EntrepreneurPulse.com.au  Fact Sheet for Healthcare Providers: IncredibleEmployment.be  This test is not yet approved or cleared by the [place] FDA and has been authorized for detection and/or diagnosis of SARS-CoV-2 by FDA under an Emergency Use Authorization (EUA). This EUA will remain in effect (meaning this test can be used) for the duration of the COVID-19 declaration under Section 564(b)(1) of the Act, 21 U.S.C. section 360bbb-3(b)(1), unless the authorization is terminated or revoked.  Performed at [facility], [place] [address], [place], [place] [address]     Procedures and diagnostic studies:  No results found.             LOS: 3 days   Georgi Navarrete  [facility]   Pager on [email or web address]. If 7PM-7AM, please contact night-coverage at [email or web address]     02/24/2021, 1:37 PM

## 2021-02-24 NOTE — Progress Notes (Signed)
   02/23/21 2310 02/24/21 0106  Assess: MEWS Score  Temp (!) 100.8 F (38.2 C) 99.2 F (37.3 C)  BP (!) 154/82 130/72  Pulse Rate (!) 110 (!) 120  SpO2  --  98 %  O2 Device  --  Room Air  Assess: MEWS Score  MEWS Temp 1 0  MEWS Systolic 0 0  MEWS Pulse 1 2  MEWS RR 0 0  MEWS LOC 0 0  MEWS Score 2 2  MEWS Score Color Yellow Yellow  Notify: Provider  Provider Name/Title  --  J.Olena Heckle  Date Provider Notified  --  02/24/21  Time Provider Notified  --  0120  Notification Type  --  Page  Notification Reason  --  Other (Comment) [phone])  Provider response  --  No new orders  Date of Provider Response  --  02/24/21  Time of Provider Response  --  0130  Assess: SIRS CRITERIA  SIRS Temperature  0 0  SIRS Pulse 1 1  SIRS Respirations  0 0  SIRS WBC 0 0  SIRS Score Sum  1 1

## 2021-02-24 NOTE — Progress Notes (Signed)
   02/23/21 2100 02/23/21 2310 02/24/21 0106  Assess: MEWS Score  Temp 99.6 F (37.6 C) (!) 100.8 F (38.2 C) 99.2 F (37.3 C)  BP 140/73 (!) 154/82 130/72  Pulse Rate (!) 111 (!) 110 (!) 120  Resp 18  --   --   SpO2 99 %  --   --   O2 Device Room Air  --   --   Assess: MEWS Score  MEWS Temp 0 1 0  MEWS Systolic 0 0 0  MEWS Pulse 2 1 2   MEWS RR 0 0 0  MEWS LOC 0 0 0  MEWS Score 2 2 2   MEWS Score Color Yellow Yellow Yellow  Assess: if the MEWS score is Yellow or Red  Were vital signs taken at a resting state? Yes  --   --   Focused Assessment No change from prior assessment  --   --   Does the patient meet 2 or more of the SIRS criteria? No  --   --   MEWS guidelines implemented *See Row Information* Yes  --   --   Notify: Charge Nurse/RN  Name of Charge Nurse/RN Notified Pam, RN  --   --   Date Charge Nurse/RN Notified 02/23/21  --   --   Time Charge Nurse/RN Notified 2200  --   --   Assess: SIRS CRITERIA  SIRS Temperature  0 0 0  SIRS Pulse 1 1 1   SIRS Respirations  0 0 0  SIRS WBC 0 0 0  SIRS Score Sum  1 1 1

## 2021-02-24 NOTE — Plan of Care (Signed)
  Problem: Clinical Measurements: Goal: Will remain free from infection Outcome: Progressing   Problem: Activity: Goal: Risk for activity intolerance will decrease Outcome: Progressing   Problem: Nutrition: Goal: Adequate nutrition will be maintained Outcome: Progressing   Problem: Urinary Elimination: Goal: Signs and symptoms of infection will decrease Outcome: Progressing

## 2021-02-24 NOTE — Care Management Important Message (Signed)
Important Message  Patient Details IM Letter placed in Patient's room. Name: Hayley Fisher MRN: [id number] Date of Birth: Jul 30, 1931   Medicare Important Message Given:  Yes     Kerin Salen 02/24/2021, 10:33 AM

## 2021-02-24 NOTE — Progress Notes (Addendum)
   02/23/21 2310  Assess: MEWS Score  Temp (!) 100.8 F (38.2 C)  BP (!) 154/82  Pulse Rate (!) 110  Resp 18  SpO2 95 %  O2 Device Room Air  Assess: MEWS Score  MEWS Temp 1  MEWS Systolic 0  MEWS Pulse 1  MEWS RR 0  MEWS LOC 0  MEWS Score 2  MEWS Score Color Yellow  Treat  MEWS Interventions Administered scheduled meds/treatments;Administered prn meds/treatments  Pain Scale PAINAD  Breathing 0  Negative Vocalization 1  Facial Expression 1  Body Language 1  Consolability 1  PAINAD Score 4  Notify: Provider  Provider Name/Title J. Olena Heckle  Date Provider Notified 02/23/21  Time Provider Notified 2315  Notification Type Page  Notification Reason Other (Comment) (CBG 413, 5 units given and notify MD per order)  Assess: SIRS CRITERIA  SIRS Temperature  0  SIRS Pulse 1  SIRS Respirations  0  SIRS WBC 0  SIRS Score Sum  1

## 2021-02-25 LAB — GLUCOSE, CAPILLARY
Glucose-Capillary: 83 mg/dL (ref 70–99)
Glucose-Capillary: 146 mg/dL — ABNORMAL HIGH (ref 70–99)
Glucose-Capillary: 208 mg/dL — ABNORMAL HIGH (ref 70–99)
Glucose-Capillary: 280 mg/dL — ABNORMAL HIGH (ref 70–99)

## 2021-02-25 MED ORDER — DULOXETINE HCL 60 MG PO CPEP
60.0000 mg | ORAL_CAPSULE | Freq: Every day | ORAL | Status: DC
Start: 2021-02-25 — End: 2023-02-06

## 2021-02-25 MED ORDER — ACCU-CHEK SOFTCLIX LANCETS MISC: 1.0000 | Status: DC

## 2021-02-25 MED ORDER — FAMOTIDINE 20 MG PO TABS: 20.0000 mg | ORAL_TABLET | Freq: Every day | ORAL | Status: DC

## 2021-02-25 MED ORDER — TRANSFER BOARD MISC: 0 refills | Status: DC

## 2021-02-25 MED ORDER — INSULIN GLARGINE 100 UNIT/ML ~~LOC~~ SOLN
15.0000 [IU] | Freq: Every day | SUBCUTANEOUS | Status: DC
  Administered 2021-02-25: 15 [IU] via SUBCUTANEOUS
  Filled 2021-02-25 (×2): qty 0.15

## 2021-02-25 MED ORDER — "INSULIN SYRINGE-NEEDLE U-100 31G X 5/16"" 0.3 ML MISC": 1.0000 | Freq: Every day | Status: DC

## 2021-02-25 MED ORDER — ACCU-CHEK AVIVA PLUS VI STRP: 1.0000 | ORAL_STRIP | Status: DC

## 2021-02-25 MED ORDER — WITCH HAZEL-GLYCERIN EX PADS: 1.0000 "application " | MEDICATED_PAD | Freq: Two times a day (BID) | CUTANEOUS | Status: DC | PRN

## 2021-02-25 MED ORDER — INSULIN GLARGINE 100 UNIT/ML ~~LOC~~ SOLN: 15.0000 [IU] | Freq: Every day | SUBCUTANEOUS | Status: DC

## 2021-02-25 NOTE — Progress Notes (Signed)
Physical Therapy Treatment Patient Details Name: Hayley Fisher MRN: [id number] DOB: [birth year] Today's Date: 02/25/2021    History of Present Illness 87 yo female currently admitted for acute encephalopathy, UTI.  Past medical history significant for dementia, insulin-dependent diabetes, hypertension, history of DVT status post IVC filter in May 2022, hyperlipidemia, hypothyroidism who presents with acute encephalopathy.  Patient has been in the hospital, SNF many times over the past 6 months.    PT Comments    Pt assisted with standing and then transfer to recliner.  Pt continues to require increased assist and +2 for weakness and safety.  Per chart, family plans to bring pt home, awaiting hospital bed.    Follow Up Recommendations  SNF     Equipment Recommendations  Wheelchair cushion (measurements PT);Wheelchair (measurements PT)    Recommendations for Other Services       Precautions / Restrictions Precautions Precautions: Fall    Mobility  Bed Mobility Overal bed mobility: Needs Assistance Bed Mobility: Supine to Sit     Supine to sit: HOB elevated;Max assist     General bed mobility comments: assist for trunk and scooting to EOB    Transfers Overall transfer level: Needs assistance Equipment used: 2 person hand held assist Transfers: Sit to/from Stand;Stand Pivot Transfers Sit to Stand: +2 physical assistance;Mod assist;From elevated surface Stand pivot transfers: Mod assist;+2 physical assistance;+2 safety/equipment       General transfer comment: assist x2 from bed for pericare and then transfer to recliner, increased difficulty advancing Rt LE requiring multimodal cues, nurse tech assisted with transfer for safety; utilized 2 HHA instead of RW  Ambulation/Gait                 Stairs             Wheelchair Mobility    Modified Rankin (Stroke Patients Only)       Balance Overall balance assessment: Needs assistance          Standing balance support: Bilateral upper extremity supported;During functional activity Standing balance-Leahy Scale: Poor Standing balance comment: requires external assistance                            Cognition Arousal/Alertness: Awake/alert Behavior During Therapy: WFL for tasks assessed/performed Overall Cognitive Status: History of cognitive impairments - at baseline                                 General Comments: hx of dementia, per notes, family reports pt able to carry a conversation      Exercises      General Comments        Pertinent Vitals/Pain Pain Assessment: No/denies pain Pain Intervention(s): Repositioned;Monitored during session    Home Living                      Prior Function            PT Goals (current goals can now be found in the care plan section) Progress towards PT goals: Progressing toward goals    Frequency    Min 2X/week      PT Plan Current plan remains appropriate    Co-evaluation              AM-PAC PT "6 Clicks" Mobility   Outcome Measure  Help needed turning from your back to your side while in  a flat bed without using bedrails?: A Lot Help needed moving from lying on your back to sitting on the side of a flat bed without using bedrails?: A Lot Help needed moving to and from a bed to a chair (including a wheelchair)?: A Lot Help needed standing up from a chair using your arms (e.g., wheelchair or bedside chair)?: Total Help needed to walk in hospital room?: Total Help needed climbing 3-5 steps with a railing? : Total 6 Click Score: 9    End of Session Equipment Utilized During Treatment: Gait belt Activity Tolerance: Patient tolerated treatment well Patient left: with call bell/phone within reach;in chair;with chair alarm set Nurse Communication: Mobility status PT Visit Diagnosis: Other abnormalities of gait and mobility (R26.89)     Time: 7517-0017 PT Time  Calculation (min) (ACUTE ONLY): 17 min  Charges:  $Therapeutic Activity: 8-22 mins                    Jannette Spanner PT, DPT Acute Rehabilitation Services Pager: [phone] Office: [place] 02/25/2021, 3:42 PM

## 2021-02-25 NOTE — TOC Progression Note (Addendum)
Transition of Care [facility] - Progression Note    Patient Details  Name: Ina Scrivens MRN: [id number] Date of Birth: 1931/03/11  Transition of Care [facility]) CM/SW Contact  Ross Ludwig, [place] Phone Number: 02/25/2021, 2:59 PM  Clinical Narrative:     CSW spoke with patient's daughter [name] [phone] to discuss home health services and what equipment patient will need to return back home.  Per daughter she would like a hospital bed for patient.  CSW updated attending physician and orders have been put in.  Hospital bed will be provided by [facility].  CSW asked about home health agency and she stated she did not have a preference, CSW contacted [facility] and they can accept patient.    Patient's daughter also requested a transfer board for patient to transfer from bed to chair.  CSW spoke to [place], equipment should be delivered tomorrow to patient's house.       Expected Discharge Plan and Services  Patient to discharge home with home health.                                               Social Determinants of Health (SDOH) Interventions    Readmission Risk Interventions Readmission Risk Prevention Plan 07/05/2020  Transportation Screening Complete  Home Care Screening Complete  Medication Review (RN CM) Complete  Some recent data might be hidden

## 2021-02-25 NOTE — Progress Notes (Deleted)
Misc. Devices (TRANSFER BOARD) MISC [[id number]]   Dose, Route, Frequency: As Directed  Dispense Quantity: 1 each Refills: 0        Sig: Use as needed for transfer from bed to chair or vice versa       Start Date: 02/25/21 End Date: --  Written Date: 02/25/21 Expiration Date: 02/25/22   Providers  Ordering and Authorizing Provider: Jennye Boroughs, MD NPI: [id number]

## 2021-02-25 NOTE — Progress Notes (Signed)
Misc. Devices (TRANSFER BOARD) MISC [[id number]]    Order Details Dose, Route, Frequency: As Directed  Dispense Quantity: 1 each Refills: 0        Sig: Use as needed for transfer from bed to chair or vice versa       Start Date: 02/25/21 End Date: --  Written Date: 02/25/21 Expiration Date: 02/25/22   Providers  Ordering and Authorizing Provider:   Jennye Boroughs, MD  [place], [place] [address]  Phone:  [phone]   Fax:  [phone]  DEA #:  [id number]   NPI:  [id number]      Ordering User:  Jennye Boroughs, MD

## 2021-02-25 NOTE — Progress Notes (Addendum)
Progress Note    Hayley Fisher  ZWC:[id number] DOB: 03/04/1931  DOA: 02/20/2021 PCP: Delora Fuel, MD      Brief Narrative:    Medical records reviewed and are as summarized below:  Hayley Fisher is a 87 y.o. female with medical history significant for dementia, insulin-dependent diabetes, hypertension, history of DVT status post IVC filter in May 2022, hyperlipidemia, hypothyroidism who presented to the hospital with altered mental status.  Lactic acid was 2.1.  She was admitted to the hospital for severe sepsis secondary to acute UTI.  Urine culture showed ESBL Klebsiella pneumonia.  She was treated with empiric antibiotics and IV fluids.      Assessment/Plan:   Active Problems:   Hyperlipidemia associated with type 2 diabetes mellitus ([place])   Hypertension associated with diabetes ([place])   Hypothyroidism   Sepsis ([place])   AMS (altered mental status)   Cognitive impairment   Acute metabolic encephalopathy   Malnutrition of moderate degree   Nutrition Problem: Moderate Malnutrition Etiology: chronic illness  Signs/Symptoms: moderate fat depletion, moderate muscle depletion, severe muscle depletion   Body mass index is 25.05 kg/m.   ESBL Klebsiella severe sepsis secondary to UTI: Completed Rocephin and 1 dose of fosfomycin on 02/23/2021.  No growth on blood cultures thus far.  No fever over the past 24 hours.  No additional antibiotics plan for now.  Acute metabolic encephalopathy with underlying dementia: Her mental status appears to be at baseline.  IDDM with severe hyperglycemia and dehydration: Glucose levels have improved.  Decrease Lantus to 15 units nightly.  Continue NovoLog 4 units 3 times daily with meals and continue sliding scale as needed.  Discontinue IV fluids later today.  Other comorbidities include hypertension, hyperlipidemia, hypothyroidism, history of bilateral lower extremity DVT s/p IVC filter placed on 01/16/2021.  Plan of care was  discussed with her daughter, [name].  [name] was informed that patient was ready for discharge to home today.  However, she said that she preferred that patient stay in the hospital through the weekend until Monday, 03/01/2021, to allow her to get her house ready.  She requested a hospital bed.  She was informed that patient may not be able to stay until Monday and was advised to try and get the house ready sooner rather than later.   Possible discharge to home tomorrow after delivery of hospital bed.    Diet Order             Diet Carb Modified Fluid consistency: Thin; Room service appropriate? Yes  Diet effective now                      Consultants: None  Procedures: None    Medications:    aspirin  81 mg Oral q morning   atorvastatin  40 mg Oral Daily   enalapril  20 mg Oral Daily   enoxaparin (LOVENOX) injection  40 mg Subcutaneous Q24H   insulin aspart  0-5 Units Subcutaneous QHS   insulin aspart  0-9 Units Subcutaneous TID WC   insulin aspart  4 Units Subcutaneous TID WC   insulin glargine  15 Units Subcutaneous QHS   levothyroxine  50 mcg Oral Q0600   polyethylene glycol  17 g Oral Daily   Continuous Infusions:  sodium chloride 75 mL/hr at 02/25/21 0414     Anti-infectives (From admission, onward)    Start     Dose/Rate Route Frequency Ordered Stop   02/23/21 1200  fosfomycin (MONUROL)  packet 3 g        3 g Oral  Once 02/23/21 1109 02/23/21 1406   02/23/21 1145  ceFEPIme (MAXIPIME) 1 g in sodium chloride 0.9 % 100 mL IVPB  Status:  Discontinued        1 g 200 mL/hr over 30 Minutes Intravenous Every 24 hours 02/23/21 1048 02/23/21 1105   02/22/21 0000  cefTRIAXone (ROCEPHIN) 1 g in sodium chloride 0.9 % 100 mL IVPB  Status:  Discontinued        1 g 200 mL/hr over 30 Minutes Intravenous Every 24 hours 02/21/21 0017 02/23/21 1048   02/20/21 2230  cefTRIAXone (ROCEPHIN) 1 g in sodium chloride 0.9 % 100 mL IVPB        1 g 200 mL/hr over 30 Minutes  Intravenous  Once 02/20/21 2220 02/20/21 2340              Family Communication/Anticipated D/C date and plan/Code Status   DVT prophylaxis: enoxaparin (LOVENOX) injection 40 mg Start: 02/21/21 1000     Code Status: Full Code  Family Communication: None Disposition Plan:    Status is: Inpatient  Remains inpatient appropriate because:Unsafe d/c plan  Dispo: The patient is from: Home              Anticipated d/c is to: SNF              Patient currently is medically stable to d/c.   Difficult to place patient No           Subjective:   Interval events noted.  She is unable to provide any history because of confusion.  Normal  Objective:    Vitals:   02/24/21 2043 02/25/21 0548 02/25/21 1027 02/25/21 1343  BP: 121/63 130/80 135/84 136/73  Pulse: (!) 105 98  (!) 107  Resp: 17 17  16   Temp: 98.5 F (36.9 C) 98.5 F (36.9 C)  98.8 F (37.1 C)  TempSrc: Oral Oral  Oral  SpO2: 96% 96%  98%  Weight:      Height:       No data found.   Intake/Output Summary (Last 24 hours) at 02/25/2021 1530 Last data filed at 02/25/2021 1226 Gross per 24 hour  Intake 2005.77 ml  Output 1400 ml  Net 605.77 ml   Filed Weights   02/20/21 2057 02/20/21 2107 02/21/21 0119  Weight: 65.8 kg 65.7 kg 60.1 kg    Exam:  GEN: NAD SKIN: Warm and dry EYES: EOMI ENT: MMM CV: RRR PULM: CTA B ABD: soft, ND, NT, +BS CNS: AAO x 2 (person and place), non focal EXT: No edema or tenderness        Data Reviewed:   I have personally reviewed following labs and imaging studies:  Labs: Labs show the following:   Basic Metabolic Panel: Recent Labs  Lab 02/20/21 2229 02/21/21 0605 02/22/21 0507 02/24/21 0414  NA 131* 133* 137 136  K 4.0 3.8 4.2 4.5  CL 95* 97* 101 97*  CO2 26 26 29 31   GLUCOSE 167* 170* 150* 384*  BUN 23 19 19  24*  CREATININE 0.73 0.78 0.66 0.98  CALCIUM 8.2* 8.2* 8.2* 8.2*   GFR Estimated Creatinine Clearance: 33.6 mL/min (by C-G formula  based on SCr of 0.98 mg/dL). Liver Function Tests: Recent Labs  Lab 02/20/21 2229  AST 14*  ALT 13  ALKPHOS 66  BILITOT 0.5  PROT 6.2*  ALBUMIN 2.5*   No results for input(s): LIPASE, AMYLASE in  the last 168 hours. No results for input(s): AMMONIA in the last 168 hours. Coagulation profile Recent Labs  Lab 02/20/21 2229  INR 1.1    CBC: Recent Labs  Lab 02/20/21 2229 02/21/21 0605 02/22/21 0507 02/24/21 0414  WBC 5.1 4.7 4.8 5.1  NEUTROABS 3.7  --   --   --   HGB 11.6* 11.2* 10.8* 11.7*  HCT 35.4* 34.7* 33.3* 36.2  MCV 95.2 95.3 94.6 94.5  PLT 270 276 305 334   Cardiac Enzymes: No results for input(s): CKTOTAL, CKMB, CKMBINDEX, TROPONINI in the last 168 hours. BNP (last 3 results) No results for input(s): PROBNP in the last 8760 hours. CBG: Recent Labs  Lab 02/24/21 1131 02/24/21 1702 02/24/21 2044 02/25/21 0714 02/25/21 1149  GLUCAP 429* 292* 305* 83 146*   D-Dimer: No results for input(s): DDIMER in the last 72 hours. Hgb A1c: No results for input(s): HGBA1C in the last 72 hours. Lipid Profile: No results for input(s): CHOL, HDL, LDLCALC, TRIG, CHOLHDL, LDLDIRECT in the last 72 hours. Thyroid function studies: No results for input(s): TSH, T4TOTAL, T3FREE, THYROIDAB in the last 72 hours.  Invalid input(s): FREET3 Anemia work up: No results for input(s): VITAMINB12, FOLATE, FERRITIN, TIBC, IRON, RETICCTPCT in the last 72 hours. Sepsis Labs: Recent Labs  Lab 02/20/21 2229 02/21/21 0250 02/21/21 0605 02/22/21 0507 02/24/21 0414  WBC 5.1  --  4.7 4.8 5.1  LATICACIDVEN 2.3* 1.7 1.0  --   --     Microbiology Recent Results (from the past 240 hour(s))  Urine culture     Status: Abnormal   Collection Time: 02/20/21  9:18 PM   Specimen: Urine, Catheterized  Result Value Ref Range Status   Specimen Description   Final    URINE, CATHETERIZED Performed at [facility], [place] [address] [place], [place] [address]    Special  Requests   Final    NONE Performed at [facility], [place] [address], [place], [place] [address]    Culture (A)  Final    >=100,000 COLONIES/mL KLEBSIELLA PNEUMONIAE Confirmed Extended Spectrum Beta-Lactamase Producer (ESBL).  In bloodstream infections from ESBL organisms, carbapenems are preferred over piperacillin/tazobactam. They are shown to have a lower risk of mortality.    Report Status 02/23/2021 FINAL  Final   Organism ID, Bacteria KLEBSIELLA PNEUMONIAE (A)  Final      Susceptibility   Klebsiella pneumoniae - MIC*    AMPICILLIN >=32 RESISTANT Resistant     CEFAZOLIN >=64 RESISTANT Resistant     CEFEPIME 2 SENSITIVE Sensitive     CEFTRIAXONE >=64 RESISTANT Resistant     CIPROFLOXACIN 2 INTERMEDIATE Intermediate     GENTAMICIN >=16 RESISTANT Resistant     IMIPENEM <=0.25 SENSITIVE Sensitive     NITROFURANTOIN 128 RESISTANT Resistant     TRIMETH/SULFA >=320 RESISTANT Resistant     AMPICILLIN/SULBACTAM >=32 RESISTANT Resistant     PIP/TAZO 8 SENSITIVE Sensitive     * >=100,000 COLONIES/mL KLEBSIELLA PNEUMONIAE  Blood Culture (routine x 2)     Status: None (Preliminary result)   Collection Time: 02/20/21 10:29 PM   Specimen: BLOOD  Result Value Ref Range Status   Specimen Description   Final    BLOOD BLOOD RIGHT FOREARM Performed at [facility] [address], [place], [place] [address]    Special Requests   Final    BOTTLES DRAWN AEROBIC ONLY Blood Culture adequate volume Performed at [place] [place] [address] [place], [place]  [address]    Culture   Final    NO GROWTH 4 DAYS Performed at [facility], [place] [address], [place], [place] [address]    Report Status PENDING  Incomplete  Blood Culture (routine x 2)     Status: None (Preliminary result)   Collection Time: 02/20/21 10:29 PM   Specimen: BLOOD  Result Value Ref Range Status   Specimen Description   Final    BLOOD BLOOD RIGHT FOREARM Performed  at [place] [address], [place], [place] [address]    Special Requests   Final    ANAEROBIC BOTTLE ONLY Blood Culture results may not be optimal due to an inadequate volume of blood received in culture bottles Performed at [facility] [address], [place], [place] [address]    Culture   Final    NO GROWTH 4 DAYS Performed at [facility], [place] [address], [place], [place] [address]    Report Status PENDING  Incomplete  Resp Panel by RT-PCR (Flu A&B, Covid) Nasopharyngeal Swab     Status: None   Collection Time: 02/20/21 11:00 PM   Specimen: Nasopharyngeal Swab; Nasopharyngeal(NP) swabs in vial transport medium  Result Value Ref Range Status   SARS Coronavirus 2 by RT PCR NEGATIVE NEGATIVE Final    Comment: (NOTE) SARS-CoV-2 target nucleic acids are NOT DETECTED.  The SARS-CoV-2 RNA is generally detectable in upper respiratory specimens during the acute phase of infection. The lowest concentration of SARS-CoV-2 viral copies this assay can detect is 138 copies/mL. A negative result does not preclude SARS-Cov-2 infection and should not be used as the sole basis for treatment or other patient management decisions. A negative result may occur with  improper specimen collection/handling, submission of specimen other than nasopharyngeal swab, presence of viral mutation(s) within the areas targeted by this assay, and inadequate number of viral copies(<138 copies/mL). A negative result must be combined with clinical observations, patient history, and epidemiological information. The expected result is Negative.  Fact Sheet for Patients:  EntrepreneurPulse.com.au  Fact Sheet for Healthcare Providers:  IncredibleEmployment.be  This test is no t yet approved or cleared by the [place] FDA and  has been authorized for detection and/or diagnosis of SARS-CoV-2 by FDA under an Emergency Use  Authorization (EUA). This EUA will remain  in effect (meaning this test can be used) for the duration of the COVID-19 declaration under Section 564(b)(1) of the Act, 21 U.S.C.section 360bbb-3(b)(1), unless the authorization is terminated  or revoked sooner.       Influenza A by PCR NEGATIVE NEGATIVE Final   Influenza B by PCR NEGATIVE NEGATIVE Final    Comment: (NOTE) The Xpert Xpress SARS-CoV-2/FLU/RSV plus assay is intended as an aid in the diagnosis of influenza from Nasopharyngeal swab specimens and should not be used as a sole basis for treatment. Nasal washings and aspirates are unacceptable for Xpert Xpress SARS-CoV-2/FLU/RSV testing.  Fact Sheet for Patients: EntrepreneurPulse.com.au  Fact Sheet for Healthcare Providers: IncredibleEmployment.be  This test is not yet approved or cleared by the [place] FDA and has been authorized for detection and/or diagnosis of SARS-CoV-2 by FDA under an Emergency Use Authorization (EUA). This EUA will remain in effect (meaning this test can be used) for the duration of the COVID-19 declaration under Section 564(b)(1) of the Act, 21 U.S.C. section 360bbb-3(b)(1), unless the authorization is terminated or revoked.  Performed at [facility], [place] [address] [place], [place] [address]  Procedures and diagnostic studies:  No results found.             LOS: 4 days   [place]  [facility]   Pager on [email or web address]. If 7PM-7AM, please contact night-coverage at [email or web address]     02/25/2021, 3:30 PM

## 2021-02-25 NOTE — TOC Progression Note (Signed)
Patient suffers from chronic _ back and bone pain which is caused by Degenerative Disc Disease, lumbosacral and osteopenia.  Hospital bed will alleviate pain by allowing body and back to be positioned in ways not feasible with a normal bed. Pain episodes frequently require frequent changes require changes in body position which cannot be achieved with a normal bed.

## 2021-02-26 LAB — GLUCOSE, CAPILLARY
Glucose-Capillary: 170 mg/dL — ABNORMAL HIGH (ref 70–99)
Glucose-Capillary: 205 mg/dL — ABNORMAL HIGH (ref 70–99)
Glucose-Capillary: 288 mg/dL — ABNORMAL HIGH (ref 70–99)

## 2021-02-26 LAB — CULTURE, BLOOD (ROUTINE X 2)
Culture: NO GROWTH
Culture: NO GROWTH
Special Requests: ADEQUATE

## 2021-02-26 LAB — TSH: TSH: 4.435 u[IU]/mL (ref 0.350–4.500)

## 2021-02-26 NOTE — Progress Notes (Signed)
Called [facility] for update for transportation.  ETA is 1900 today 02/26/21.

## 2021-02-26 NOTE — TOC Transition Note (Addendum)
Transition of Care [facility]) - CM/SW Discharge Note   Patient Details  Name: Hayley Fisher MRN: [id number] Date of Birth: 11-02-1930  [facility] CM/SW Contact:  Ross Ludwig, LCSW Phone Number: 02/26/2021, 2:54 PM   Clinical Narrative:     CSW received a phone call from patient's daughter [name], that patient's hospital bed will be delivered in a few minutes.  CSW was informed she needs EMS transport home, CSW contacted EMS to arrange for transportation.  CSW signing off, home health agency [facility] is aware that patient is discharging today.   Final next level of care: [place] Barriers to Discharge: Barriers Resolved   Patient Goals and CMS Choice Patient states their goals for this hospitalization and ongoing recovery are:: To return back home with home health services. CMS Medicare.gov Compare Post Acute Care list provided to:: Patient Represenative (must comment) Choice offered to / list presented to : Adult Children  Discharge Placement                       Discharge Plan and Services     Post Acute Care Choice: Durable Medical Equipment, Home Health          DME Arranged: Hospital bed DME Agency: [facility] Date DME Agency Contacted: 02/26/21 Time DME Agency Contacted: 1300 Representative spoke with at DME Agency: Freda Munro HH Arranged: PT, OT, Social Work [facility] Agency: [facility] Date [place]: 02/25/21 Time [place]: 1453 Representative spoke with at [place]: [place] ([place]) Interventions     Readmission Risk Interventions Readmission Risk Prevention Plan 07/05/2020  Transportation Screening Complete  Home Care Screening Complete  Medication Review (RN CM) Complete  Some recent data might be hidden

## 2021-02-26 NOTE — Progress Notes (Signed)
Patient wheeled down to the main entrance and assisted into the car with help of the daughter and son in law. No further questions or concerns.

## 2021-02-26 NOTE — Progress Notes (Signed)
Called patient's daughter, [name], with updated transportation time of 1900 via [facility].  Discussed AVS, discharge instructions, information related to urinary tract infections, and current medication regimen via teach-back.  She will f/u with endocrinologist regarding TSH results.  No additional questions/ concerns noted at this time.

## 2021-02-26 NOTE — Discharge Summary (Signed)
Physician Discharge Summary  Hayley Fisher TZG:[id number] DOB: 25-Mar-1931 DOA: 02/20/2021  PCP: Delora Fuel, MD  Admit date: 02/20/2021 Discharge date: 02/26/2021  Discharge disposition: Home with home health therapy   Recommendations for Outpatient Follow-Up:   Follow-up with PCP in 1 week   Discharge Diagnosis:   Active Problems:   Hyperlipidemia associated with type 2 diabetes mellitus ([place])   Hypertension associated with diabetes ([place])   Hypothyroidism   Sepsis ([place])   AMS (altered mental status)   Cognitive impairment   Acute metabolic encephalopathy   Malnutrition of moderate degree    Discharge Condition: Stable.  Diet recommendation:  Diet Order             Diet - low sodium heart healthy           Diet Carb Modified Fluid consistency: Thin; Room service appropriate? Yes  Diet effective now                     Code Status: Full Code     Hospital Course:   Hayley Fisher is a 87 y.o. female with medical history significant for dementia, insulin-dependent diabetes, hypertension, history of DVT status post IVC filter in May 2022, hyperlipidemia, hypothyroidism who presented to the hospital with altered mental status.  Lactic acid was 2.1.    She was admitted to the hospital for severe sepsis secondary to acute UTI.  Urine culture showed ESBL Klebsiella pneumonia.  She was treated with empiric antibiotics (IV ceftriaxone and oral fosfomycin) and IV fluids.  PT and OT recommended discharge to SNF.  However, her daughter prefers that patient be discharged home with home health therapy.  She requested a hospital bed and transfer board for home use.  Her condition has improved and she is deemed stable for discharge to home today.  She is at high risk for readmission and close follow-up was recommended.     Discharge Exam:    Vitals:   02/25/21 1343 02/25/21 2036 02/26/21 0458 02/26/21 1304  BP: 136/73 (!) 149/85 (!) 142/81 114/66  Pulse: (!) 107  (!) 106 (!) 110 (!) 106  Resp: 16 14 16 20   Temp: 98.8 F (37.1 C) 99.5 F (37.5 C) 98.8 F (37.1 C) 98.8 F (37.1 C)  TempSrc: Oral Oral Oral Oral  SpO2: 98% 99% 99% 100%  Weight:      Height:         GEN: NAD SKIN: Warm and dry EYES: No pallor or icterus ENT: MMM CV: RRR PULM: CTA B ABD: soft, ND, NT, +BS CNS: AAO x 2 (person and place), non focal EXT: No edema or tenderness   The results of significant diagnostics from this hospitalization (including imaging, microbiology, ancillary and laboratory) are listed below for reference.     Procedures and Diagnostic Studies:   CT Head Wo Contrast  Result Date: 02/20/2021 CLINICAL DATA:  Mental status change.  Unknown cause. EXAM: CT HEAD WITHOUT CONTRAST TECHNIQUE: Contiguous axial images were obtained from the base of the skull through the vertex without intravenous contrast. COMPARISON:  CT head 01/14/2021, CT head 09/18/2011 FINDINGS: Brain: Cerebral ventricle sizes are concordant with the degree of cerebral volume loss. Patchy and confluent areas of decreased attenuation are noted throughout the deep and periventricular white matter of the cerebral hemispheres bilaterally, compatible with chronic microvascular ischemic disease. No evidence of large-territorial acute infarction. No parenchymal hemorrhage. No mass lesion. No extra-axial collection. No mass effect or midline shift. No hydrocephalus. Basilar  cisterns are patent. Empty sella. Vascular: No hyperdense vessel. Atherosclerotic calcifications are present within the cavernous internal carotid arteries. Skull: No acute fracture or focal lesion. Sinuses/Orbits: Paranasal sinuses and mastoid air cells are clear. The orbits are unremarkable. Other: None. IMPRESSION: 1. No acute intracranial abnormality. 2. Empty sella. Findings is often a normal anatomic variant but can be associated with idiopathic intracranial hypertension (pseudotumor cerebri). Electronically Signed   By: Iven Finn M.D.   On: 02/20/2021 23:37   DG Chest Port 1 View  Result Date: 02/20/2021 CLINICAL DATA:  Recent diagnosis of urinary tract infection. Intermittent confusion. History of diabetes and hypertension. EXAM: PORTABLE CHEST 1 VIEW COMPARISON:  01/14/2021 FINDINGS: Shallow inspiration with infiltration or atelectasis in the lung bases, greater on the left. Normal heart size and pulmonary vascularity. No pleural effusions. No pneumothorax. Mediastinal contours appear intact. IMPRESSION: Shallow inspiration with infiltration or atelectasis in the lung bases, greater on the left. Electronically Signed   By: Lucienne Capers M.D.   On: 02/20/2021 22:02     Labs:   Basic Metabolic Panel: Recent Labs  Lab 02/20/21 2229 02/21/21 0605 02/22/21 0507 02/24/21 0414  NA 131* 133* 137 136  K 4.0 3.8 4.2 4.5  CL 95* 97* 101 97*  CO2 26 26 29 31   GLUCOSE 167* 170* 150* 384*  BUN 23 19 19  24*  CREATININE 0.73 0.78 0.66 0.98  CALCIUM 8.2* 8.2* 8.2* 8.2*   GFR Estimated Creatinine Clearance: 33.6 mL/min (by C-G formula based on SCr of 0.98 mg/dL). Liver Function Tests: Recent Labs  Lab 02/20/21 2229  AST 14*  ALT 13  ALKPHOS 66  BILITOT 0.5  PROT 6.2*  ALBUMIN 2.5*   No results for input(s): LIPASE, AMYLASE in the last 168 hours. No results for input(s): AMMONIA in the last 168 hours. Coagulation profile Recent Labs  Lab 02/20/21 2229  INR 1.1    CBC: Recent Labs  Lab 02/20/21 2229 02/21/21 0605 02/22/21 0507 02/24/21 0414  WBC 5.1 4.7 4.8 5.1  NEUTROABS 3.7  --   --   --   HGB 11.6* 11.2* 10.8* 11.7*  HCT 35.4* 34.7* 33.3* 36.2  MCV 95.2 95.3 94.6 94.5  PLT 270 276 305 334   Cardiac Enzymes: No results for input(s): CKTOTAL, CKMB, CKMBINDEX, TROPONINI in the last 168 hours. BNP: Invalid input(s): POCBNP CBG: Recent Labs  Lab 02/25/21 1149 02/25/21 1648 02/25/21 2032 02/26/21 0725 02/26/21 1121  GLUCAP 146* 208* 280* 170* 205*   D-Dimer No results for  input(s): DDIMER in the last 72 hours. Hgb A1c No results for input(s): HGBA1C in the last 72 hours. Lipid Profile No results for input(s): CHOL, HDL, LDLCALC, TRIG, CHOLHDL, LDLDIRECT in the last 72 hours. Thyroid function studies Recent Labs    02/26/21 1021  TSH 4.435   Anemia work up No results for input(s): VITAMINB12, FOLATE, FERRITIN, TIBC, IRON, RETICCTPCT in the last 72 hours. Microbiology Recent Results (from the past 240 hour(s))  Urine culture     Status: Abnormal   Collection Time: 02/20/21  9:18 PM   Specimen: Urine, Catheterized  Result Value Ref Range Status   Specimen Description   Final    URINE, CATHETERIZED Performed at [facility] [address] [place], [place] [address]    Special Requests   Final    NONE Performed at [facility], [place] [address], [place],  [address]    Culture (A)  Final    >=100,000 COLONIES/mL KLEBSIELLA  PNEUMONIAE Confirmed Extended Spectrum Beta-Lactamase Producer (ESBL).  In bloodstream infections from ESBL organisms, carbapenems are preferred over piperacillin/tazobactam. They are shown to have a lower risk of mortality.    Report Status 02/23/2021 FINAL  Final   Organism ID, Bacteria KLEBSIELLA PNEUMONIAE (A)  Final      Susceptibility   Klebsiella pneumoniae - MIC*    AMPICILLIN >=32 RESISTANT Resistant     CEFAZOLIN >=64 RESISTANT Resistant     CEFEPIME 2 SENSITIVE Sensitive     CEFTRIAXONE >=64 RESISTANT Resistant     CIPROFLOXACIN 2 INTERMEDIATE Intermediate     GENTAMICIN >=16 RESISTANT Resistant     IMIPENEM <=0.25 SENSITIVE Sensitive     NITROFURANTOIN 128 RESISTANT Resistant     TRIMETH/SULFA >=320 RESISTANT Resistant     AMPICILLIN/SULBACTAM >=32 RESISTANT Resistant     PIP/TAZO 8 SENSITIVE Sensitive     * >=100,000 COLONIES/mL KLEBSIELLA PNEUMONIAE  Blood Culture (routine x 2)     Status: None   Collection Time: 02/20/21 10:29 PM   Specimen: BLOOD  Result Value  Ref Range Status   Specimen Description   Final    BLOOD BLOOD RIGHT FOREARM Performed at [facility] [address] [place], [place] [address]    Special Requests   Final    BOTTLES DRAWN AEROBIC ONLY Blood Culture adequate volume Performed at [facility] [address], [place], [place] [address]    Culture   Final    NO GROWTH 5 DAYS Performed at [facility], [place] [address], [place], [place] [address]    Report Status 02/26/2021 FINAL  Final  Blood Culture (routine x 2)     Status: None   Collection Time: 02/20/21 10:29 PM   Specimen: BLOOD  Result Value Ref Range Status   Specimen Description   Final    BLOOD BLOOD RIGHT FOREARM Performed at [facility] [address] [place], [place] [address]    Special Requests   Final    ANAEROBIC BOTTLE ONLY Blood Culture results may not be optimal due to an inadequate volume of blood received in culture bottles Performed at [facility] [address] [place], [place] [address]    Culture   Final    NO GROWTH 5 DAYS Performed at [facility], [place] [address] [place], [place] [address]    Report Status 02/26/2021 FINAL  Final  Resp Panel by RT-PCR (Flu A&B, Covid) Nasopharyngeal Swab     Status: None   Collection Time: 02/20/21 11:00 PM   Specimen: Nasopharyngeal Swab; Nasopharyngeal(NP) swabs in vial transport medium  Result Value Ref Range Status   SARS Coronavirus 2 by RT PCR NEGATIVE NEGATIVE Final    Comment: (NOTE) SARS-CoV-2 target nucleic acids are NOT DETECTED.  The SARS-CoV-2 RNA is generally detectable in upper respiratory specimens during the acute phase of infection. The lowest concentration of SARS-CoV-2 viral copies this assay can detect is 138 copies/mL. A negative result does not preclude SARS-Cov-2 infection and should not be used as the sole basis for treatment or other patient management decisions. A negative  result may occur with  improper specimen collection/handling, submission of specimen other than nasopharyngeal swab, presence of viral mutation(s) within the areas targeted by this assay, and inadequate number of viral copies(<138 copies/mL). A negative result must be combined with clinical observations, patient history, and epidemiological information. The expected result is Negative.  Fact Sheet for Patients:  EntrepreneurPulse.com.au  Fact  Sheet for Healthcare Providers:  IncredibleEmployment.be  This test is no t yet approved or cleared by the [place] FDA and  has been authorized for detection and/or diagnosis of SARS-CoV-2 by FDA under an Emergency Use Authorization (EUA). This EUA will remain  in effect (meaning this test can be used) for the duration of the COVID-19 declaration under Section 564(b)(1) of the Act, 21 U.S.C.section 360bbb-3(b)(1), unless the authorization is terminated  or revoked sooner.       Influenza A by PCR NEGATIVE NEGATIVE Final   Influenza B by PCR NEGATIVE NEGATIVE Final    Comment: (NOTE) The Xpert Xpress SARS-CoV-2/FLU/RSV plus assay is intended as an aid in the diagnosis of influenza from Nasopharyngeal swab specimens and should not be used as a sole basis for treatment. Nasal washings and aspirates are unacceptable for Xpert Xpress SARS-CoV-2/FLU/RSV testing.  Fact Sheet for Patients: EntrepreneurPulse.com.au  Fact Sheet for Healthcare Providers: IncredibleEmployment.be  This test is not yet approved or cleared by the [place] FDA and has been authorized for detection and/or diagnosis of SARS-CoV-2 by FDA under an Emergency Use Authorization (EUA). This EUA will remain in effect (meaning this test can be used) for the duration of the COVID-19 declaration under Section 564(b)(1) of the Act, 21 U.S.C. section 360bbb-3(b)(1), unless the authorization is  terminated or revoked.  Performed at [facility], [place] [address] [place], [place] [address]      Discharge Instructions:   Discharge Instructions     Diet - low sodium heart healthy   Complete by: As directed    Discharge wound care:   Complete by: As directed    Avoid laying on your back for too long   Face-to-face encounter (required for Medicare/Medicaid patients)   Complete by: As directed    I Ayriana Wix certify that this patient is under my care and that I, or a nurse practitioner or physician's assistant working with me, had a face-to-face encounter that meets the physician face-to-face encounter requirements with this patient on 02/25/2021. The encounter with the patient was in whole, or in part for the following medical condition(s) which is the primary reason for home health care (List medical condition): Dementia   The encounter with the patient was in whole, or in part, for the following medical condition, which is the primary reason for home health care: Dementia   I certify that, based on my findings, the following services are medically necessary home health services: Physical therapy   Reason for Medically Necessary Home Health Services: Therapy- Personnel officer, Public librarian   My clinical findings support the need for the above services: Cognitive impairments, dementia, or mental confusion  that make it unsafe to leave home   Further, I certify that my clinical findings support that this patient is homebound due to: Mental confusion   For home use only DME Hospital bed   Complete by: As directed    Length of Need: Lifetime   Bed type: Semi-electric   Home Health   Complete by: As directed    To provide the following care/treatments:  PT OT Social work     Increase activity slowly   Complete by: As directed       Allergies as of 02/26/2021       Reactions   Levemir [insulin Detemir] Itching        Medication  List     STOP taking these medications    ketoconazole 2 % cream  Commonly known as: NIZORAL       TAKE these medications    Accu-Chek Aviva Plus test strip Generic drug: glucose blood 1 each by Other route See admin instructions. USE TO TEST BLOOD SUGAR UP  TO 3 TIMES A DAY.   Accu-Chek Softclix Lancets lancets 1 each by Other route See admin instructions. USE TO TEST BLOOD SUGAR UP  TO 3 TIMES A DAY.   aspirin 81 MG chewable tablet Chew 81 mg by mouth every morning.   atorvastatin 40 MG tablet Commonly known as: LIPITOR Take 1 tablet (40 mg total) by mouth daily.   brimonidine 0.1 % Soln Commonly known as: ALPHAGAN P Place 1 drop into both eyes 2 (two) times daily.   Catheter Self-Adhesive Urinary Misc 1 patch by Does not apply route daily.   Debrox 6.5 % OTIC solution Generic drug: carbamide peroxide Place 2 drops into both ears 2 (two) times daily as needed (ear wax). What changed: when to take this   docusate sodium 100 MG capsule Commonly known as: COLACE Take 1 capsule (100 mg total) by mouth 2 (two) times daily.   dorzolamide-timolol 22.3-6.8 MG/ML ophthalmic solution Commonly known as: COSOPT Place 1 drop into both eyes 2 (two) times daily.   DULoxetine 60 MG capsule Commonly known as: CYMBALTA Take 1 capsule (60 mg total) by mouth daily.   enalapril 20 MG tablet Commonly known as: VASOTEC Take 1 tablet (20 mg total) by mouth daily.   famotidine 20 MG tablet Commonly known as: Pepcid Take 1 tablet (20 mg total) by mouth daily.   ferrous sulfate 325 (65 FE) MG tablet Take 325 mg by mouth daily with breakfast.   insulin glargine 100 UNIT/ML injection Commonly known as: LANTUS Inject 0.15 mLs (15 Units total) into the skin at bedtime.   insulin lispro 100 UNIT/ML injection Commonly known as: HUMALOG Inject 2-15 Units into the skin See admin instructions. Per sliding scale, subcutaneous with meals if blood sugar is 70-150 = 0 units  151-200 = 2  units  201-250 = 3 units  251-300 = 8 units  301-350 = 11 units  351-400 = 15 units  >400 = Call MD.   Inject units into skin according to the sliding scale three times daily with meals.   Insulin Syringe-Needle U-100 31G X 5/16" 0.3 ML Misc Commonly known as: Sure Comfort Insulin Syringe Inject 1 Syringe into the skin daily.   levothyroxine 50 MCG tablet Commonly known as: SYNTHROID Take 1 tablet (50 mcg total) by mouth daily.   metFORMIN 1000 MG tablet Commonly known as: GLUCOPHAGE Take 1,000 mg by mouth daily.   mirabegron ER 25 MG Tb24 tablet Commonly known as: Myrbetriq Take 1 tablet (25 mg total) by mouth daily.   ondansetron 4 MG disintegrating tablet Commonly known as: Zofran ODT Take 1 tablet (4 mg total) by mouth every 8 (eight) hours as needed for nausea.   polyethylene glycol powder 17 GM/SCOOP powder Commonly known as: GLYCOLAX/MIRALAX Take 17 g by mouth daily.   PreserVision AREDS 2+Multi Vit Caps Take 1 capsule by mouth daily.   Rybelsus 3 MG Tabs Generic drug: Semaglutide Take 3 mg by mouth daily.   tamsulosin 0.4 MG Caps capsule Commonly known as: FLOMAX Take 0.4 mg by mouth daily.   Transfer Board Misc Use as needed for transfer from bed to chair or vice versa   Vyzulta 0.024 % Soln Generic drug: Latanoprostene Bunod Place 1 drop into both eyes at bedtime.  witch hazel-glycerin pad Commonly known as: TUCKS Apply 1 application topically 2 (two) times daily as needed for itching.               Durable Medical Equipment  (From admission, onward)           Start     Ordered   02/25/21 0000  For home use only DME Hospital bed       Question Answer Comment  Length of Need Lifetime   Bed type Semi-electric      02/25/21 0753              Discharge Care Instructions  (From admission, onward)           Start     Ordered   02/26/21 0000  Discharge wound care:       Comments: Avoid laying on your back for too long    02/26/21 1431              Time coordinating discharge: 32 minutes  Signed:  Dayna Alia  [facility] 02/26/2021, 2:32 PM   Pager on [email or web address]. If 7PM-7AM, please contact night-coverage at [email or web address]

## 2021-02-26 NOTE — Progress Notes (Signed)
Called patient's daughter, [name], to update her on the time for PTAR. She asked if she could come pick her mother up herself instead. I informed her that the patient is a max two assist and she said that it was fine as she has a sister and brother in law who can help as well. No additional questions at this time.

## 2021-02-27 NOTE — Progress Notes (Signed)
Daughter, [name], called stating patient was discharged 02/26/2021 and they do not have Humalog at home to cover her sliding scale order.  This was a prior to admit medication, no new medications were started at patients discharge.  I called pharmacist at [facility] who stated they had refills on Lantus there but patient had never gotten Humalog from their pharmacy.  Patient was at a SNF prior to being brought to hospital so it may be that this was started at the SNF and therefore patient never had a script.  DIscharging physician not on service today, this RN contacted director over hospitalists who agreed to call in Humalog to [facility].  Daughter [name] notified of above.

## 2021-03-01 ENCOUNTER — Other Ambulatory Visit: Payer: Self-pay

## 2021-03-01 ENCOUNTER — Telehealth: Payer: Self-pay | Admitting: Family Medicine

## 2021-03-01 DIAGNOSIS — Z794 Long term (current) use of insulin: Secondary | ICD-10-CM

## 2021-03-01 DIAGNOSIS — E0840 Diabetes mellitus due to underlying condition with diabetic neuropathy, unspecified: Secondary | ICD-10-CM

## 2021-03-01 NOTE — Telephone Encounter (Signed)
Patients daughter is calling to check on the status of the forms being completed for her mothers personal care services. I informed her that we received the paper work Friday and they were placed is Dr. Dorma Russell box.   Patients daughter would like for someone to call her and let her know when they have been completed. The best call back is [phone].

## 2021-03-03 ENCOUNTER — Telehealth: Payer: Self-pay | Admitting: *Deleted

## 2021-03-03 NOTE — Chronic Care Management (AMB) (Signed)
  Chronic Care Management   Note  03/03/2021 Name: Hayley Fisher MRN: [id number] DOB: 12-Aug-1931  Hayley Fisher is a 87 y.o. year old female who is a primary care patient of Maness, Arnette Norris, MD. I reached out to Winifred Olive by phone today in response to a referral sent by Hayley Fisher's PCP, Delora Fuel, MD      Hayley Fisher was given information about Chronic Care Management services today including:  CCM service includes personalized support from designated clinical staff supervised by her physician, including individualized plan of care and coordination with other care providers 24/7 contact phone numbers for assistance for urgent and routine care needs. Service will only be billed when office clinical staff spend 20 minutes or more in a month to coordinate care. Only one practitioner may furnish and bill the service in a calendar month. The patient may stop CCM services at any time (effective at the end of the month) by phone call to the office staff. The patient will be responsible for cost sharing (co-pay) of up to 20% of the service fee (after annual deductible is met).  Patient agreed to services and verbal consent obtained.   Follow up plan: Telephone appointment with care management team member scheduled for:03/10/2021  [place]

## 2021-03-03 NOTE — Patient Instructions (Addendum)
It was great to see you! Thank you for allowing me to participate in your care!   I recommend that you always bring your medications to each appointment as this makes it easy to ensure we are on the correct medications and helps [place] not miss when refills are needed.  Our plans for today:  - Getting Urine analysis and culture today - Please schedule an appointment with urology for blood in urine - Decrease Lantus to 10 U a day  - Please Bring continuous blood glucose monitoring strips in at next visit -I have put in a hospice referral and someone should contact you in regards to this -Please follow up in 2 weeks to see how you are doing on diabetes regimen  We are checking some labs today, I will call you if they are abnormal will send you a MyChart message or a letter if they are normal.  If you do not hear about your labs in the next 2 weeks please let us know.  Take care and seek immediate care sooner if you develop any concerns.   [name], MD [place]

## 2021-03-03 NOTE — Telephone Encounter (Signed)
Patients daughter calling back about these forms for PCOS. She still has not heard an update on these. Can someone please call her with an update? Please advise.

## 2021-03-03 NOTE — Progress Notes (Signed)
    SUBJECTIVE:   CHIEF COMPLAINT / HPI: hospital f/u sepsis related to lower uti   Hospital follow-up Patient here for follow up for ESBL klebsiella UTI and sepsis who completed iv ceftriaxone and oral fosfomycin in hospital. Patient hasn't had any falls but is weak when standing. No fevers. Has had increased urinary frequency with extra diapers being used and incontinence. Does not have indwelling catheter. Currently at home with daughter.  Diabetes II Managed with Insulin: Currently taking 15 U Lantus at night and sliding scale Humalog as directed. Did not have CBG readings today. Patient had lows during mornings. 80 this morning, last night 163, lunchtime 217, breakfast 68. Daughter has been giving snacks to bring it up. Patient has been having some increased fatigue and sweating.   Chronic hematuria Blood in urine going on for months. Unable to assess if it is painful with other UTI symptoms present.  Dementia-  daughter believes dementia has progressed a lot. Has a lot of baseline confusion and talks like she is in past, hallucinating visually for a while.   Thyroid- family wanted to talk about TSH level, it was normalized at last reading and discussed.   Age related physical disability-  Not able to stand up on own. Daughter has to hold her up to clean her  78  PMH / PSH: DVT post IVC filter, Insulin dependent DMII, HTN, Hypothyroidism, Hyperlipidemia, Dementia  OBJECTIVE:   BP 112/82   Pulse 98   SpO2 94%   Physical Exam Constitutional:      General: She is sleeping.     Comments: Oriented to time, place, and person. Sitting in wheelchair.  HENT:     Head: Normocephalic and atraumatic.  Cardiovascular:     Rate and Rhythm: Normal rate and regular rhythm.  Pulmonary:     Effort: Pulmonary effort is normal.     Breath sounds: Normal breath sounds. No wheezing or rales.  Abdominal:     General: Bowel sounds are normal.     Palpations: Abdomen is soft.      Tenderness: There is no guarding.     Comments: No tenderness on suprapubic pressure.  Musculoskeletal:     Comments: No calf tenderness or increased size in either leg. Slight non-pitting edema near ankles.  Skin:    General: Skin is warm and dry.  Psychiatric:        Behavior: Behavior is cooperative.   PHQ-9 filled out by daughter had 3's on many of them, daughter believes patient is not suicidal.   ASSESSMENT/PLAN:   Type 2 diabetes mellitus with diabetic neuropathy ([place]) Decreased lantus to 10 U daily from 15U daily due to low blood sugar readings in mornings. Taking humalog sliding scale currently. Will discuss glucose control at next visit in two weeks.  Hypothyroidism Last TSH in normal range. Recheck as necesaary.  Age-related physical debility Referred patient to hospice upon daughter's request. Provided information packet on hospice and other care options with AVS  Frequent UTI Got UA with culture today for increased frequency and incontinence in the setting of post hospital follow up for sepsis due to UTI. Denies fevers and no suprapubic pain on exam.  Hematuria Patient has been having blood in urine for months. One reading over 50 RBCs in urine. Suspicious for bladder cancer. Patient will set up appoint with her urologist.      [name], [place]

## 2021-03-04 ENCOUNTER — Other Ambulatory Visit: Payer: Self-pay

## 2021-03-04 ENCOUNTER — Telehealth: Payer: Self-pay | Admitting: Student

## 2021-03-04 ENCOUNTER — Ambulatory Visit (INDEPENDENT_AMBULATORY_CARE_PROVIDER_SITE_OTHER): Payer: Medicare Other | Admitting: Student

## 2021-03-04 VITALS — BP 112/82 | HR 98

## 2021-03-04 DIAGNOSIS — R310 Gross hematuria: Secondary | ICD-10-CM

## 2021-03-04 DIAGNOSIS — N390 Urinary tract infection, site not specified: Secondary | ICD-10-CM

## 2021-03-04 DIAGNOSIS — Z794 Long term (current) use of insulin: Secondary | ICD-10-CM

## 2021-03-04 DIAGNOSIS — R350 Frequency of micturition: Secondary | ICD-10-CM

## 2021-03-04 DIAGNOSIS — R54 Age-related physical debility: Secondary | ICD-10-CM | POA: Diagnosis not present

## 2021-03-04 DIAGNOSIS — E039 Hypothyroidism, unspecified: Secondary | ICD-10-CM | POA: Diagnosis not present

## 2021-03-04 DIAGNOSIS — E1140 Type 2 diabetes mellitus with diabetic neuropathy, unspecified: Secondary | ICD-10-CM | POA: Diagnosis not present

## 2021-03-04 LAB — POCT URINALYSIS DIP (MANUAL ENTRY)
Bilirubin, UA: NEGATIVE
Glucose, UA: NEGATIVE mg/dL
Nitrite, UA: NEGATIVE
Protein Ur, POC: 100 mg/dL — AB
Spec Grav, UA: 1.025 (ref 1.010–1.025)
Urobilinogen, UA: 0.2 E.U./dL
pH, UA: 6 (ref 5.0–8.0)

## 2021-03-04 LAB — POCT UA - MICROSCOPIC ONLY

## 2021-03-04 MED ORDER — INSULIN GLARGINE 100 UNIT/ML ~~LOC~~ SOLN: 10.0000 [IU] | Freq: Every day | SUBCUTANEOUS | Status: DC

## 2021-03-04 NOTE — Assessment & Plan Note (Addendum)
Got UA with culture today for increased frequency and incontinence in the setting of post hospital follow up for sepsis due to UTI. Denies fevers and no suprapubic pain on exam.

## 2021-03-04 NOTE — Assessment & Plan Note (Addendum)
Decreased lantus to 10 U daily from 15U daily due to low blood sugar readings in mornings. Taking humalog sliding scale currently. Will discuss glucose control at next visit in two weeks.

## 2021-03-04 NOTE — Telephone Encounter (Signed)
Called patient's daughter [name] today and talked to her about the results of UA and dipstick showing multiple WBCs and blood. Explained after consulting with Dr. Tommy Medal in Infectious Disease and Dr. Gwendlyn Deutscher, we will wait on antibiotics given the resistance to oral medications until we get the culture back. (Can consider oral fosfomycin 3g in future with culture) Advised to take her to the hospital if there are any fevers, bladder pain, or altered mental status. Daughter let me know that patient has Urology appointment tomorrow morning and would like UA sent to the [facility] office for Dr. Matilde Sprang.

## 2021-03-04 NOTE — Assessment & Plan Note (Signed)
Referred patient to hospice upon daughter's request. Provided information packet on hospice and other care options with AVS

## 2021-03-04 NOTE — Assessment & Plan Note (Addendum)
Patient has been having blood in urine for months. One reading over 50 RBCs in urine. Suspicious for bladder cancer. Patient will set up appoint with her urologist.

## 2021-03-04 NOTE — Assessment & Plan Note (Addendum)
Last TSH in normal range. Recheck as necesaary.

## 2021-03-05 DIAGNOSIS — N3020 Other chronic cystitis without hematuria: Secondary | ICD-10-CM | POA: Diagnosis not present

## 2021-03-05 DIAGNOSIS — R3914 Feeling of incomplete bladder emptying: Secondary | ICD-10-CM | POA: Diagnosis not present

## 2021-03-05 NOTE — Telephone Encounter (Signed)
Dr. Manus Rudd,  Do you have this form?  It is no longer in your box.  Christen Bame, CMA

## 2021-03-08 ENCOUNTER — Telehealth: Payer: Self-pay | Admitting: Student

## 2021-03-08 LAB — URINE CULTURE

## 2021-03-08 MED ORDER — LEVOFLOXACIN 750 MG PO TABS: 750.0000 mg | ORAL_TABLET | Freq: Every day | ORAL | 0 refills | Status: AC

## 2021-03-08 NOTE — Telephone Encounter (Signed)
Called daughter Ms. Randson was not available.

## 2021-03-08 NOTE — Telephone Encounter (Signed)
Called Hayley Fisher's sister Hayley Fisher) and talked with her and Hayley Fisher's daughters about urine culture. Daughter said she got a shot of ceftriaxone on Friday at the urologist's office. I asked if taking any other oral antibiotics and Hayley Fisher said no. Daughter said that urine is still bloody like last Thursday. Advised to take to ED if there is worsening of symptoms, fevers, abdominal pain, or increased confusion. Qtc looks normal on recent EKG and strain is susceptible to Levofloxacin. Given that Hayley Fisher had the ceftriaxone shot on Friday at the Urologist's, I sent in a 4 day course of levofloxacin 750 mg. Told daughters and sister to schedule appointment for Hayley Fisher if there is no improvement after this course.

## 2021-03-09 ENCOUNTER — Ambulatory Visit: Payer: Medicare Other | Admitting: Podiatry

## 2021-03-09 ENCOUNTER — Telehealth: Payer: Self-pay | Admitting: Student

## 2021-03-09 NOTE — Telephone Encounter (Signed)
Erline Levine with [facility] is calling to inform Dr. Jinny Sanders that the patient is not longer at the facility. She has been discharged with Palliative care at home. Erline Levine would like for someone to call giving a verbal okay that Dr. Jinny Sanders agrees that they continue the care at home.    The best call back is [phone] option 2

## 2021-03-09 NOTE — Telephone Encounter (Signed)
Routed message to PCP. Avrum Kimball, CMA  

## 2021-03-10 ENCOUNTER — Ambulatory Visit: Payer: Medicare Other

## 2021-03-10 NOTE — Patient Instructions (Addendum)
Hayley Fisher  it was nice speaking with you. Please call me directly [phone] if you have questions about the goals we discussed.   Goals Addressed             This Visit's Progress    Monitor and Manage My Blood Sugar-Diabetes Type 2       Timeframe:  Long-Range Goal Priority:  High Start Date:    03/10/21                         Expected End Date:     05/21/21                  Follow Up Date 03/2221    - check blood sugar at prescribed times - check blood sugar if I feel it is too high or too low - enter blood sugar readings and medication or insulin into daily log - take the blood sugar log to all doctor visits    Why is this important?   Checking your blood sugar at home helps to keep it from getting very high or very low.  Writing the results in a diary or log helps the doctor know how to care for you.  Your blood sugar log should have the time, date and the results.  Also, write down the amount of insulin or other medicine that you take.  Other information, like what you ate, exercise done and how you were feeling, will also be helpful.     Notes:         Patient Care Plan: Diabetes Type 2 (Adult)     Problem Identified: Glycemic Management (Diabetes, Type 2)      Goal: Glycemic Management Optimized   Start Date: 03/10/2021  Expected End Date: 05/21/2021  This Visit's Progress: On track  Priority: High  Note:   Current Barriers:  Knowledge Deficits related to plan of care for management of DMII  Chronic Disease Management support and education needs related to DMII  RNCM Clinical Goal(s):  Patient/Daughter will verbalize understanding of plan for management of DMII through collaboration with RN Care manager, provider, and care team.   Interventions: 1:1 collaboration with primary care provider regarding development and update of comprehensive plan of care as evidenced by provider attestation and co-signature Inter-disciplinary care team collaboration (see  longitudinal plan of care)   Diabetes:  (Status: Goal on track: YES.) Lab Results  Component Value Date   HGBA1C 6.4 (H) 01/14/2021  Assessed patient's understanding of A1c goal:  patient will remain at current level. Provided education to patient about basic DM disease process; Reviewed medications with patient and discussed importance of medication adherence; Discussed plans with patient for ongoing care management follow up and provided patient with direct contact information for care management team; Provided patient with verbal information regarding hypo and hyperglycemia and importance of correct treatment; Reviewed scheduled/upcoming provider appointments including: Daughter states that the patient has an appointment today with [facility]; Advised patient, providing education and rationale, to check CBG and record, calling the office for findings outside established parameters; RNCM will send the patient and daughter educational material a Diabetic packet and Calendar. Daughter states that she knows her mother's only issue with her blood sugars is diet.  She is checking 3 times a day and it will run between 73-386.  When it is elevated it is because she has eaten something not in her normal diet.  No problems with her bowels or Sleeping  Hayley Fisher is calling Hayley Fisher, Hayley Fisher today because they have not come to the home yet to start PT,OT. She states that the paperwork for a PCA has been sent and faxed. She has also called to get her mothers diabetic and incontinent supplies refilled. She states that for the first time she will need an order form the office.  Patient Goals/Self-Care Activities: Patient/Daughter will self administer medications as prescribed Patient/Daughter will attend all scheduled provider appointments Patient/Daughter will call pharmacy for medication refills Patient /Daughter will call provider office for new concerns or questions  Follow Up Plan: Telephone follow up  appointment with care management team member scheduled for: 04/12/21         Hayley Fisher received Care Management services today:  Care Management services include personalized support from designated clinical staff supervised by her physician, including individualized plan of care and coordination with other care providers 24/7 contact [phone] for assistance for urgent and routine care needs. Care Management are voluntary services and be declined at any time by calling the office.  The patient verbalized understanding of instructions provided today and declined a print copy of patient instruction materials.    Follow Up Plan: Patient would like continued follow-up.  CCM RNCM will outreach the patient within the next 2 weeks  Patient will call office if needed prior to next encounter   Lazaro Arms, RN   [phone]

## 2021-03-10 NOTE — Telephone Encounter (Signed)
Agree with verbal order for palliative care. RN team can you please call and let them know?

## 2021-03-10 NOTE — Chronic Care Management (AMB) (Signed)
Care Management    RN Visit Note  03/10/2021 Name: Hayley Fisher MRN: [id number] DOB: 1930-12-18  Subjective: Hayley Fisher is a 87 y.o. year old female who is a primary care patient of Hayley Heck, MD. The care management team was consulted for assistance with disease management and care coordination needs.    Engaged with patient by telephone for initial visit in response to provider referral for case management and/or care coordination services.   Consent to Services:   Hayley Fisher was given information about Care Management services today including:  Care Management services includes personalized support from designated clinical staff supervised by her physician, including individualized plan of care and coordination with other care providers 24/7 contact phone numbers for assistance for urgent and routine care needs. The patient may stop case management services at any time by phone call to the office staff.  Patient agreed to services and consent obtained.    Assessment: Patient is making progress with her diabetes.  Her daughter is helping monitoring her diet and giving her medications . See Care Plan below for interventions and patient self-care actives. Follow up Plan: Patient would like continued follow-up.  CCM RNCM will outreach the patient within the next 2 weeks  Patient will call office if needed prior to next encounter Review of patient past medical history, allergies, medications, health status, including review of consultants reports, laboratory and other test data, was performed as part of comprehensive evaluation and provision of chronic care management services.   SDOH (Social Determinants of Health) assessments and interventions performed:    Care Plan  Allergies  Allergen Reactions   Levemir [Insulin Detemir] Itching    Outpatient Encounter Medications as of 03/10/2021  Medication Sig   Accu-Chek Softclix Lancets lancets 1 each by Other route See admin  instructions. USE TO TEST BLOOD SUGAR UP  TO 3 TIMES A DAY.   aspirin 81 MG chewable tablet Chew 81 mg by mouth every morning.   atorvastatin (LIPITOR) 40 MG tablet Take 1 tablet (40 mg total) by mouth daily.   brimonidine (ALPHAGAN P) 0.1 % SOLN Place 1 drop into both eyes 2 (two) times daily.   carbamide peroxide (DEBROX) 6.5 % OTIC solution Place 2 drops into both ears 2 (two) times daily as needed (ear wax).   docusate sodium (COLACE) 100 MG capsule Take 1 capsule (100 mg total) by mouth 2 (two) times daily.   dorzolamide-timolol (COSOPT) 22.3-6.8 MG/ML ophthalmic solution Place 1 drop into both eyes 2 (two) times daily.   DULoxetine (CYMBALTA) 60 MG capsule Take 1 capsule (60 mg total) by mouth daily.   enalapril (VASOTEC) 20 MG tablet Take 1 tablet (20 mg total) by mouth daily.   famotidine (PEPCID) 20 MG tablet Take 1 tablet (20 mg total) by mouth daily.   ferrous sulfate 325 (65 FE) MG tablet Take 325 mg by mouth daily with breakfast.   glucose blood (ACCU-CHEK AVIVA PLUS) test strip 1 each by Other route See admin instructions. USE TO TEST BLOOD SUGAR UP  TO 3 TIMES A DAY.   insulin glargine (LANTUS) 100 UNIT/ML injection Inject 0.1 mLs (10 Units total) into the skin at bedtime.   insulin lispro (HUMALOG) 100 UNIT/ML injection Inject 2-15 Units into the skin See admin instructions. Per sliding scale, subcutaneous with meals if blood sugar is 70-150 = 0 units  151-200 = 2 units  201-250 = 3 units  251-300 = 8 units  301-350 = 11 units  351-400 = 15  units  >400 = Call MD.   Inject units into skin according to the sliding scale three times daily with meals.   Insulin Syringe-Needle U-100 (SURE COMFORT INSULIN SYRINGE) 31G X 5/16" 0.3 ML MISC Inject 1 Syringe into the skin daily.   levofloxacin (LEVAQUIN) 750 MG tablet Take 1 tablet (750 mg total) by mouth daily for 4 days.   levothyroxine (SYNTHROID) 50 MCG tablet Take 1 tablet (50 mcg total) by mouth daily.   metFORMIN (GLUCOPHAGE)  1000 MG tablet Take 1,000 mg by mouth daily.   Misc. Devices (TRANSFER BOARD) MISC Use as needed for transfer from bed to chair or vice versa   ondansetron (ZOFRAN ODT) 4 MG disintegrating tablet Take 1 tablet (4 mg total) by mouth every 8 (eight) hours as needed for nausea.   polyethylene glycol powder (GLYCOLAX/MIRALAX) 17 GM/SCOOP powder Take 17 g by mouth daily.   Semaglutide (RYBELSUS) 3 MG TABS Take 3 mg by mouth daily.   tamsulosin (FLOMAX) 0.4 MG CAPS capsule Take 0.4 mg by mouth daily.   VYZULTA 0.024 % SOLN Place 1 drop into both eyes at bedtime.   Catheter Self-Adhesive Urinary MISC 1 patch by Does not apply route daily. (Patient not taking: No sig reported)   mirabegron ER (MYRBETRIQ) 25 MG TB24 tablet Take 1 tablet (25 mg total) by mouth daily. (Patient not taking: Reported on 03/10/2021)   Multiple Vitamins-Minerals (PRESERVISION AREDS 2+MULTI VIT) CAPS Take 1 capsule by mouth daily. (Patient not taking: Reported on 03/10/2021)   witch hazel-glycerin (TUCKS) pad Apply 1 application topically 2 (two) times daily as needed for itching. (Patient not taking: No sig reported)   No facility-administered encounter medications on file as of 03/10/2021.    Patient Active Problem List   Diagnosis Date Noted   Malnutrition of moderate degree 02/23/2021   AMS (altered mental status) 02/21/2021   Cognitive impairment 53/66/4403   Acute metabolic encephalopathy 47/42/5956   Sepsis ([place]) 01/15/2021   Acute encephalopathy 12/09/2020   Hyperbilirubinemia 12/09/2020   Hypothyroidism 12/09/2020   Memory changes 10/20/2020   Acute on chronic anemia 09/21/2020   Gastroesophageal reflux disease without esophagitis 09/16/2020   Urinary frequency 09/11/2020   Stage 1 decubitus ulcer 07/30/2020   Uncontrolled type 2 diabetes mellitus with hyperglycemia ([place]) 07/02/2020   Unstable gait 04/03/2018   Frequent falls 04/03/2018   Age-related physical debility 03/22/2018   Hematuria 01/29/2018   Skin  ulcer of sacrum, limited to breakdown of skin ([place]) 03/16/2017   Spinal stenosis of lumbar region 01/13/2016   Chronic venous insufficiency 11/09/2015   DDD (degenerative disc disease), lumbosacral 11/24/2014   Constipation by delayed colonic transit 11/24/2014   Frequent UTI 10/29/2012   Type 2 diabetes mellitus with diabetic neuropathy ([place]) 10/19/2006   Hyperlipidemia associated with type 2 diabetes mellitus ([place]) 10/19/2006   Hypertension associated with diabetes ([place]) 10/19/2006   Osteopenia 10/19/2006    Conditions to be addressed/monitored: DMII  Care Plan : Diabetes Type 2 (Adult)  Updates made by Lazaro Arms, RN since 03/10/2021 12:00 AM     Problem: Glycemic Management (Diabetes, Type 2)      Goal: Glycemic Management Optimized   Start Date: 03/10/2021  Expected End Date: 05/21/2021  This Visit's Progress: On track  Priority: High  Note:   urrent Barriers:  Knowledge Deficits related to plan of care for management of DMII  Chronic Disease Management support and education needs related to DMII  RNCM Clinical Goal(s):  Patient/Daughter will verbalize understanding of plan for  management of DMII through collaboration with RN Care manager, provider, and care team.   Interventions: 1:1 collaboration with primary care provider regarding development and update of comprehensive plan of care as evidenced by provider attestation and co-signature Inter-disciplinary care team collaboration (see longitudinal plan of care)   Diabetes:  (Status: Goal on track: YES.) Lab Results  Component Value Date   HGBA1C 6.4 (H) 01/14/2021  Assessed patient's understanding of A1c goal:  patient will remain at current level. Provided education to patient about basic DM disease process; Reviewed medications with patient and discussed importance of medication adherence; Discussed plans with patient for ongoing care management follow up and provided patient with direct contact information for  care management team; Provided patient with verbal information regarding hypo and hyperglycemia and importance of correct treatment; Reviewed scheduled/upcoming provider appointments including: Daughter states that the patient has an appointment today with [facility]; Advised patient, providing education and rationale, to check CBG and record, calling the office for findings outside established parameters; RNCM will send the patient and daughter educational material a Diabetic packet and Calendar. Daughter states that she knows her mother's only issue with her blood sugars is diet.  She is checking 3 times a day and it will run between 73-386.  When it is elevated it is because she has eaten something not in her normal diet.  No problems with her bowels or Sleeping [name] is calling [facility] today because they have not come to the home yet to start PT,OT. She states that the paperwork for a PCA has been sent and faxed. She has also called to get her mothers diabetic and incontinent supplies refilled. She states that for the first time she will need an order form the office.  Patient Goals/Self-Care Activities: Patient/Daughter will self administer medications as prescribed Patient/Daughter will attend all scheduled provider appointments Patient/Daughter will call pharmacy for medication refills Patient /Daughter will call provider office for new concerns or questions  Follow Up Plan: Telephone follow up appointment with care management team member scheduled for: 04/12/21       Plan: Telephone follow up appointment with care management team member scheduled for:  04/12/21  Lazaro Arms RN, BSN, [facility] Care Management Coordinator [place] Phone: [phone] I Fax: [phone]

## 2021-03-11 ENCOUNTER — Telehealth: Payer: Self-pay | Admitting: *Deleted

## 2021-03-11 DIAGNOSIS — R54 Age-related physical debility: Secondary | ICD-10-CM

## 2021-03-11 NOTE — Telephone Encounter (Signed)
Patient's daughter [name] called and she would like her mom to be evaluated by [facility] for some rehab options/ and nursing.  Will forward to MD to place a referral for home health.  Tamika Nou,CMA

## 2021-03-11 NOTE — Telephone Encounter (Signed)
Called and provided with verbal order.   Talbot Grumbling, RN

## 2021-03-16 ENCOUNTER — Other Ambulatory Visit: Payer: Self-pay

## 2021-03-16 DIAGNOSIS — N390 Urinary tract infection, site not specified: Secondary | ICD-10-CM

## 2021-03-16 NOTE — Progress Notes (Signed)
Patient's daughter calls nurse line requesting to bring in urine sample for possible UTI. Patient is catheterized and is experiencing cloudy urine, malodorous urine, and slight back pain.   Spoke with Dr. Owens Shark regarding patient. Received verbal order for urine culture. Provided daughter with ED precautions.   Placed brown bag up front with supplies to collect sample.   Talbot Grumbling, RN

## 2021-03-17 ENCOUNTER — Other Ambulatory Visit: Payer: Self-pay | Admitting: Family Medicine

## 2021-03-17 DIAGNOSIS — N390 Urinary tract infection, site not specified: Secondary | ICD-10-CM | POA: Diagnosis not present

## 2021-03-17 DIAGNOSIS — M6281 Muscle weakness (generalized): Secondary | ICD-10-CM | POA: Diagnosis not present

## 2021-03-19 LAB — URINE CULTURE: Organism ID, Bacteria: NO GROWTH

## 2021-03-22 ENCOUNTER — Telehealth: Payer: Self-pay

## 2021-03-22 NOTE — Telephone Encounter (Signed)
Patients daughter calls nurse line requesting urine culture results. Results given to daughter. Daughter reports her urine is still very cloudy and has an odor. Daughter reports she has been complaining of burning as well. Daughter denies fever, chills, or low back pain. Daughter is requesting an antibiotic for symptoms. Patient has a FU with PCP scheduled for 8/12. Please advise.

## 2021-03-23 DIAGNOSIS — N139 Obstructive and reflux uropathy, unspecified: Secondary | ICD-10-CM | POA: Diagnosis not present

## 2021-03-23 DIAGNOSIS — R3914 Feeling of incomplete bladder emptying: Secondary | ICD-10-CM | POA: Diagnosis not present

## 2021-03-24 NOTE — Telephone Encounter (Signed)
Attempted to reach pt just to see how she was doing to see if she was still having symptoms, and to remind her of her appt on 8/12 at 2:45. No answer LVM to call the office if she has any questions or concerns. Salvatore Marvel, CMA

## 2021-03-29 ENCOUNTER — Other Ambulatory Visit: Payer: Self-pay | Admitting: Family Medicine

## 2021-03-29 DIAGNOSIS — R350 Frequency of micturition: Secondary | ICD-10-CM

## 2021-03-31 ENCOUNTER — Telehealth: Payer: Self-pay | Admitting: Endocrinology

## 2021-03-31 NOTE — Telephone Encounter (Signed)
Pt is needing a refill for   insulin lispro (HUMALOG) 100 UNIT/ML injection insulin glargine (LANTUS) 100 UNIT/ML injection  SLIDING SCALE   70-150 0 UNITS  151-200 2 UNITS  201-250 3 UNITS  251-300 8 UNITS  301-350 11 UNITS  351-400 15 units  GREATER THAN 400 CALL MD   [facility] - [place], [place] - [address]

## 2021-03-31 NOTE — Telephone Encounter (Signed)
Daughter provided sliding scale to Dr.Ellison for review.

## 2021-03-31 NOTE — Progress Notes (Deleted)
    SUBJECTIVE:   CHIEF COMPLAINT / HPI:  Med rec  Urinary Symptoms -symptoms? Frequency? Dysuria? -finished antibiotics? S/p 4 days levofloxacin, ceftriaxone at urologist -need myrbetriq? -hematuria -catheter? -urology?  DMII -blood sugars? Hgb a1 6.4 on 5/26  Any fevers/vomiting?  PERTINENT  PMH / PSH: HTN associated with diabetes, DMII insulin dependent, hematuria, memory changes, unstable gait  OBJECTIVE:   There were no vitals taken for this visit.  ***  ASSESSMENT/PLAN:   No problem-specific Assessment & Plan notes found for this encounter.     [name], MD [place]

## 2021-04-02 ENCOUNTER — Ambulatory Visit: Payer: Medicare Other | Admitting: Student

## 2021-04-06 ENCOUNTER — Other Ambulatory Visit: Payer: Self-pay

## 2021-04-06 ENCOUNTER — Ambulatory Visit (INDEPENDENT_AMBULATORY_CARE_PROVIDER_SITE_OTHER): Admitting: Endocrinology

## 2021-04-06 ENCOUNTER — Ambulatory Visit: Payer: Medicare Other | Admitting: Endocrinology

## 2021-04-06 VITALS — BP 120/60 | HR 100 | Ht 61.0 in | Wt 124.6 lb

## 2021-04-06 DIAGNOSIS — E1140 Type 2 diabetes mellitus with diabetic neuropathy, unspecified: Secondary | ICD-10-CM | POA: Diagnosis not present

## 2021-04-06 DIAGNOSIS — Z794 Long term (current) use of insulin: Secondary | ICD-10-CM | POA: Diagnosis not present

## 2021-04-06 LAB — POCT GLYCOSYLATED HEMOGLOBIN (HGB A1C): Hemoglobin A1C: 6.3 % — AB (ref 4.0–5.6)

## 2021-04-06 MED ORDER — INSULIN GLARGINE 100 UNIT/ML ~~LOC~~ SOLN: 10.0000 [IU] | SUBCUTANEOUS | Status: DC

## 2021-04-06 MED ORDER — METFORMIN HCL ER 500 MG PO TB24: 1000.0000 mg | ORAL_TABLET | Freq: Every day | ORAL | 3 refills | Status: DC

## 2021-04-06 NOTE — Patient Instructions (Addendum)
check your blood sugar 4 times a day.  vary the time of day when you check, between before the 3 meals, and at bedtime.  also check if you have symptoms of your blood sugar being too high or too low.  please keep a record of the readings and bring it to your next appointment here (or you can bring the meter itself).  You can write it on any piece of paper.  please call us sooner if your blood sugar goes below 70, or if most of your readings are over 200.   Please stop taking the Humalog.   I have sent a prescription to your pharmacy, to change metformin to extended-release.   Please continue the same Rybelsus.    Please come back for a follow-up appointment in 2 months.

## 2021-04-06 NOTE — Progress Notes (Signed)
Subjective:    Patient ID: Hayley Fisher, female    DOB: [birth year], 87 y.o.   MRN: [id number]  HPI Pt returns for f/u of diabetes mellitus:  DM type: 2   Dx'ed: [id number] Complications: DR and PN  Therapy: insulin since 2021, and metformin.   GDM: never DKA: never Severe hypoglycemia: never Pancreatitis: never Pancreatic imaging: normal on 2021 CT SDOH: dtr Elmyra Ricks gives pt her insulin, and checks pt's cbg.   Other: She prefers vial over pen Interval history: no cbg record, but states cbg's varies from 70-200.  It is in general higher as the day goes on.  Lantus is 10/d.  Humalog averages 3-4 units/d.   Pt also has chronic primary hypothyroidism (she has never had thyroid imaging).  She takes synthroid as rx'ed.  Past Medical History:  Diagnosis Date   Arthritis    Arthritis    Cataract    bil cateracts removed   Chronic kidney disease    "spot on one of my kidneys" per pt   Diabetes mellitus    Glaucoma    Hyperkalemia 07/02/2020   Hyperlipidemia    Hyperplastic colon polyp    Hypertension    Hypothyroidism 0000000   Metabolic acidosis [id number]   Syncope    Thyroid disease     Past Surgical History:  Procedure Laterality Date   ABDOMINAL HYSTERECTOMY     BLADDER SUSPENSION     bladder tacking     COLONOSCOPY     EYE SURGERY     INGUINAL HERNIA REPAIR Right    IR IVC FILTER PLMT / S&I /IMG GUID/MOD SED  01/16/2021    Social History   Socioeconomic History   Marital status: Single    Spouse name: Not on file   Number of children: 4   Years of education: Not on file   Highest education level: Not on file  Occupational History   Occupation: retired  Tobacco Use   Smoking status: Former    Types: Cigarettes    Quit date: 11/29/1980    Years since quitting: 40.3   Smokeless tobacco: Never  Vaping Use   Vaping Use: Never used  Substance and Sexual Activity   Alcohol use: No    Alcohol/week: 0.0 standard drinks   Drug use: No   Sexual  activity: Yes    Birth control/protection: None  Other Topics Concern   Not on file  Social History Narrative   Not on file   Social Determinants of Health   Financial Resource Strain: Not on file  Food Insecurity: Not on file  Transportation Needs: Not on file  Physical Activity: Not on file  Stress: Not on file  Social Connections: Not on file  Intimate Partner Violence: Not on file    Current Outpatient Medications on File Prior to Visit  Medication Sig Dispense Refill   Accu-Chek Softclix Lancets lancets 1 each by Other route See admin instructions. USE TO TEST BLOOD SUGAR UP  TO 3 TIMES A DAY.     aspirin 81 MG chewable tablet Chew 81 mg by mouth every morning.     atorvastatin (LIPITOR) 40 MG tablet Take 1 tablet (40 mg total) by mouth daily. 90 tablet 3   brimonidine (ALPHAGAN P) 0.1 % SOLN Place 1 drop into both eyes 2 (two) times daily.     carbamide peroxide (DEBROX) 6.5 % OTIC solution Place 2 drops into both ears 2 (two) times daily as needed (ear wax).  Catheter Self-Adhesive Urinary MISC 1 patch by Does not apply route daily. 12 each 0   docusate sodium (COLACE) 100 MG capsule Take 1 capsule (100 mg total) by mouth 2 (two) times daily. 10 capsule 0   dorzolamide-timolol (COSOPT) 22.3-6.8 MG/ML ophthalmic solution Place 1 drop into both eyes 2 (two) times daily.     DULoxetine (CYMBALTA) 60 MG capsule Take 1 capsule (60 mg total) by mouth daily.     enalapril (VASOTEC) 20 MG tablet Take 1 tablet (20 mg total) by mouth daily. 90 tablet 3   famotidine (PEPCID) 20 MG tablet Take 1 tablet (20 mg total) by mouth daily.     ferrous sulfate 325 (65 FE) MG tablet Take 325 mg by mouth daily with breakfast.     glucose blood (ACCU-CHEK AVIVA PLUS) test strip 1 each by Other route See admin instructions. USE TO TEST BLOOD SUGAR UP  TO 3 TIMES A DAY.     Insulin Syringe-Needle U-100 (SURE COMFORT INSULIN SYRINGE) 31G X 5/16" 0.3 ML MISC Inject 1 Syringe into the skin daily.      levothyroxine (SYNTHROID) 50 MCG tablet Take 1 tablet (50 mcg total) by mouth daily. 90 tablet 3   Misc. Devices (TRANSFER BOARD) MISC Use as needed for transfer from bed to chair or vice versa 1 each 0   Multiple Vitamins-Minerals (PRESERVISION AREDS 2+MULTI VIT) CAPS Take 1 capsule by mouth daily.     MYRBETRIQ 25 MG TB24 tablet TAKE 1 TABLET BY MOUTH DAILY 30 tablet 1   ondansetron (ZOFRAN ODT) 4 MG disintegrating tablet Take 1 tablet (4 mg total) by mouth every 8 (eight) hours as needed for nausea. 10 tablet 0   polyethylene glycol powder (GLYCOLAX/MIRALAX) 17 GM/SCOOP powder Take 17 g by mouth daily. 255 g 0   Semaglutide (RYBELSUS) 3 MG TABS Take 3 mg by mouth daily. 30 tablet 11   tamsulosin (FLOMAX) 0.4 MG CAPS capsule Take 0.4 mg by mouth daily.     VYZULTA 0.024 % SOLN Place 1 drop into both eyes at bedtime.  0   witch hazel-glycerin (TUCKS) pad Apply 1 application topically 2 (two) times daily as needed for itching.     No current facility-administered medications on file prior to visit.    Allergies  Allergen Reactions   Levemir [Insulin Detemir] Itching    Family History  Problem Relation Age of Onset   Colon cancer Mother    Other Father        cerebral hemorrhage   Diabetes Brother    Diabetes Sister        x 2   Bone cancer Daughter    Breast cancer Daughter 26   Esophageal cancer Neg Hx    Rectal cancer Neg Hx    Stomach cancer Neg Hx     BP 120/60 (BP Location: Right Arm, Patient Position: Sitting, Cuff Size: Normal)   Pulse 100   Ht '5\' 1"'$  (1.549 m)   Wt 124 lb 9.6 oz (56.5 kg)   SpO2 96%   BMI 23.54 kg/m     Review of Systems Nausea is mild    Objective:   Physical Exam VITAL SIGNS:  See vs page.   GENERAL: no distress.     Lab Results  Component Value Date   CREATININE 0.98 02/24/2021   BUN 24 (H) 02/24/2021   NA 136 02/24/2021   K 4.5 02/24/2021   CL 97 (L) 02/24/2021   CO2 31 02/24/2021  Lab Results  Component Value Date    HGBA1C 6.3 (A) 04/06/2021      Assessment & Plan:  Type 2 DM: well-controlled Nausea.  We discussed.  We agree to continue Rybelsus, and to change metformin to -XR  Patient Instructions  check your blood sugar 4 times a day.  vary the time of day when you check, between before the 3 meals, and at bedtime.  also check if you have symptoms of your blood sugar being too high or too low.  please keep a record of the readings and bring it to your next appointment here (or you can bring the meter itself).  You can write it on any piece of paper.  please call us sooner if your blood sugar goes below 70, or if most of your readings are over 200.   Please stop taking the Humalog.   I have sent a prescription to your pharmacy, to change metformin to extended-release.   Please continue the same Rybelsus.    Please come back for a follow-up appointment in 2 months.

## 2021-04-07 ENCOUNTER — Telehealth: Payer: Self-pay | Admitting: Endocrinology

## 2021-04-07 NOTE — Telephone Encounter (Signed)
Joy with [facility] called re: PHARM did not receive RX for insulin glargine (LANTUS) 100 UNIT/ML injection Patient called them expecting the above Rx to be ready (Sowing as a No Print).  PHARM requests new RX for insulin glargine (LANTUS) 100 UNIT/ML injection  Be sent to  [facility], [place] - [address] Phone:  [phone]  Fax:  [phone]

## 2021-04-08 ENCOUNTER — Encounter: Payer: Self-pay | Admitting: Internal Medicine

## 2021-04-08 ENCOUNTER — Other Ambulatory Visit: Payer: Self-pay

## 2021-04-08 ENCOUNTER — Telehealth: Payer: Self-pay | Admitting: Endocrinology

## 2021-04-08 DIAGNOSIS — E1140 Type 2 diabetes mellitus with diabetic neuropathy, unspecified: Secondary | ICD-10-CM

## 2021-04-08 MED ORDER — INSULIN GLARGINE 100 UNIT/ML ~~LOC~~ SOLN: 10.0000 [IU] | SUBCUTANEOUS | 3 refills | Status: DC

## 2021-04-08 NOTE — Telephone Encounter (Signed)
Joy from [facility] is calling regarding pt's insulin states that a prescription was sent in for the vile and pt uses the pen. Pt is wondering if it can be switched.

## 2021-04-08 NOTE — Telephone Encounter (Signed)
Vial of insulin was sent instead of the pen

## 2021-04-12 ENCOUNTER — Telehealth: Payer: Self-pay

## 2021-04-12 ENCOUNTER — Telehealth: Payer: Medicare Other

## 2021-04-12 NOTE — Telephone Encounter (Signed)
Message sent thru MyChart 

## 2021-04-12 NOTE — Telephone Encounter (Signed)
   RN Case Manager Care Management   Phone Outreach    04/12/2021 Name: Ellender Bailer MRN: [id number] DOB: 04/03/1931  Roselle Cucinella is a 87 y.o. year old female who is a primary care patient of [name], MD .   Telephone outreach was unsuccessful A HIPPA compliant phone message was left for the patient providing contact information and requesting a return call.   Plan:Will route chart to Care Guide to see if patient would like to reschedule phone appointment   Review of patient status, including review of consultants reports, relevant laboratory and other test results, and collaboration with appropriate care team members and the patient's provider was performed as part of comprehensive patient evaluation and provision of care management services.    Lazaro Arms RN, BSN, [facility] Care Management Coordinator [place] Phone: [phone] I Fax: [phone]

## 2021-04-12 NOTE — Telephone Encounter (Signed)
Wants a RX Humalog called in states her levels go up to 400 with out it and wants insulin sent as a pen and not a vial. Call daughter at [phone]

## 2021-04-13 ENCOUNTER — Other Ambulatory Visit (HOSPITAL_COMMUNITY): Payer: Self-pay

## 2021-04-13 MED ORDER — INSULIN LISPRO (1 UNIT DIAL) 100 UNIT/ML (KWIKPEN)
2.0000 [IU] | PEN_INJECTOR | Freq: Every day | SUBCUTANEOUS | 2 refills | Status: DC | PRN
  Filled 2021-04-13 (×3): qty 3, 20d supply, fill #0

## 2021-04-13 MED ORDER — INSULIN PEN NEEDLE 32G X 4 MM MISC
2 refills | Status: DC
  Filled 2021-04-13 (×3): qty 100, 90d supply, fill #0
  Filled 2021-08-02: qty 100, 90d supply, fill #1

## 2021-04-13 NOTE — Telephone Encounter (Signed)
Joy from [facility] called again regarding pt's insulin. Joy states that a prescription was sent in for the Lantus in the vial, however, Patient uses the Lantus Solostar Pen. Joy requests the following new RX:  MEDICATION: LANTUS SOLOSTAR PEN  With PEN NEEDLES  PHARMACY:    [facility] - [place], [place] - [address] Phone:  [phone]  Fax:  [phone]      HAS THE PATIENT CONTACTED [place]?  Yes-PHARM called  IS THIS A 90 DAY SUPPLY : Not sure-if possible, yes  IS PATIENT OUT OF MEDICATION: Yes  IF NOT; HOW MUCH IS LEFT: 0  LAST APPOINTMENT DATE: '@8'$ /17/2022  NEXT APPOINTMENT DATE:'@10'$ /19/2022  DO WE HAVE YOUR PERMISSION TO LEAVE A DETAILED MESSAGE?: N/A  OTHER COMMENTS:    **Let patient know to contact pharmacy at the end of the day to make sure medication is ready. **  ** Please notify patient to allow 48-72 hours to process**  **Encourage patient to contact the pharmacy for refills or they can request refills through [facility]**

## 2021-04-15 ENCOUNTER — Other Ambulatory Visit (HOSPITAL_COMMUNITY): Payer: Self-pay

## 2021-04-15 ENCOUNTER — Other Ambulatory Visit: Payer: Self-pay

## 2021-04-15 DIAGNOSIS — E1140 Type 2 diabetes mellitus with diabetic neuropathy, unspecified: Secondary | ICD-10-CM

## 2021-04-15 DIAGNOSIS — Z794 Long term (current) use of insulin: Secondary | ICD-10-CM

## 2021-04-15 MED ORDER — INSULIN GLARGINE 100 UNITS/ML SOLOSTAR PEN: 10.0000 [IU] | PEN_INJECTOR | Freq: Every day | SUBCUTANEOUS | 1 refills | Status: DC

## 2021-04-15 MED ORDER — INSULIN GLARGINE-YFGN 100 UNIT/ML ~~LOC~~ SOPN
15.0000 [IU] | PEN_INJECTOR | Freq: Every evening | SUBCUTANEOUS | 2 refills | Status: DC
  Filled 2021-04-15: qty 3, 20d supply, fill #0

## 2021-04-15 MED ORDER — INSULIN PEN NEEDLE 32G X 4 MM MISC
0 refills | Status: DC
  Filled 2021-04-15: qty 100, 30d supply, fill #0

## 2021-04-15 NOTE — Addendum Note (Signed)
Addended by: Darlina Rumpf A on: 04/15/2021 12:15 PM   Modules accepted: Orders

## 2021-04-16 ENCOUNTER — Other Ambulatory Visit (HOSPITAL_COMMUNITY): Payer: Self-pay

## 2021-04-17 DIAGNOSIS — M6281 Muscle weakness (generalized): Secondary | ICD-10-CM | POA: Diagnosis not present

## 2021-04-19 ENCOUNTER — Other Ambulatory Visit (HOSPITAL_COMMUNITY): Payer: Self-pay

## 2021-04-20 ENCOUNTER — Ambulatory Visit: Payer: Medicare Other

## 2021-04-20 NOTE — Chronic Care Management (AMB) (Signed)
Chronic Care Management   CCM RN Visit Note  04/20/2021 Name: Hayley Fisher MRN: [id number] DOB: [birth year]  Subjective: Hayley Fisher is a 87 y.o. year old female who is a primary care patient of [name], MD. The care management team was consulted for assistance with disease management and care coordination needs.    Engaged with patient by telephone for follow up visit in response to provider referral for case management and/or care coordination services.   Consent to Services:  The patient was given information about Chronic Care Management services, agreed to services, and gave verbal consent prior to initiation of services.  Please see initial visit note for detailed documentation.   Patient agreed to services and verbal consent obtained.    Assessment: Patient's daughter provided all information during this encounter. The patient continues to maintain positive progress with care plan goals.. See Care Plan below for interventions and patient self-care actives. Follow up Plan: Patient would like continued follow-up.  CCM RNCM will outreach the patient within the next 4 weeks.  Patient will call office if needed prior to next encounter Review of patient past medical history, allergies, medications, health status, including review of consultants reports, laboratory and other test data, was performed as part of comprehensive evaluation and provision of chronic care management services.   SDOH (Social Determinants of Health) assessments and interventions performed:    CCM Care Plan  Allergies  Allergen Reactions   Levemir [Insulin Detemir] Itching    Outpatient Encounter Medications as of 04/20/2021  Medication Sig   Accu-Chek Softclix Lancets lancets 1 each by Other route See admin instructions. USE TO TEST BLOOD SUGAR UP  TO 3 TIMES A DAY.   aspirin 81 MG chewable tablet Chew 81 mg by mouth every morning.   atorvastatin (LIPITOR) 40 MG tablet Take 1 tablet (40 mg total)  by mouth daily.   brimonidine (ALPHAGAN P) 0.1 % SOLN Place 1 drop into both eyes 2 (two) times daily.   carbamide peroxide (DEBROX) 6.5 % OTIC solution Place 2 drops into both ears 2 (two) times daily as needed (ear wax).   Catheter Self-Adhesive Urinary MISC 1 patch by Does not apply route daily.   docusate sodium (COLACE) 100 MG capsule Take 1 capsule (100 mg total) by mouth 2 (two) times daily.   dorzolamide-timolol (COSOPT) 22.3-6.8 MG/ML ophthalmic solution Place 1 drop into both eyes 2 (two) times daily.   DULoxetine (CYMBALTA) 60 MG capsule Take 1 capsule (60 mg total) by mouth daily.   enalapril (VASOTEC) 20 MG tablet Take 1 tablet (20 mg total) by mouth daily.   famotidine (PEPCID) 20 MG tablet Take 1 tablet (20 mg total) by mouth daily.   ferrous sulfate 325 (65 FE) MG tablet Take 325 mg by mouth daily with breakfast.   glucose blood (ACCU-CHEK AVIVA PLUS) test strip 1 each by Other route See admin instructions. USE TO TEST BLOOD SUGAR UP  TO 3 TIMES A DAY.   insulin glargine (LANTUS) 100 unit/mL SOPN Inject 10 Units into the skin daily.   insulin glargine-yfgn (SEMGLEE) 100 UNIT/ML Pen Inject 15 Units into the skin at bedtime.   insulin lispro (ADMELOG SOLOSTAR) 100 UNIT/ML KwikPen Inject 2-15 Units into the skin as needed with sliding scale. Max dose is 15 units daily   Insulin Pen Needle 32G X 4 MM MISC Use as directed with insulin   Insulin Pen Needle 32G X 4 MM MISC Use as directed   Insulin Syringe-Needle U-100 (SURE COMFORT INSULIN  SYRINGE) 31G X 5/16" 0.3 ML MISC Inject 1 Syringe into the skin daily.   levothyroxine (SYNTHROID) 50 MCG tablet Take 1 tablet (50 mcg total) by mouth daily.   metFORMIN (GLUCOPHAGE-XR) 500 MG 24 hr tablet Take 2 tablets (1,000 mg total) by mouth daily with breakfast.   Misc. Devices (TRANSFER BOARD) MISC Use as needed for transfer from bed to chair or vice versa   Multiple Vitamins-Minerals (PRESERVISION AREDS 2+MULTI VIT) CAPS Take 1 capsule by  mouth daily.   MYRBETRIQ 25 MG TB24 tablet TAKE 1 TABLET BY MOUTH DAILY   ondansetron (ZOFRAN ODT) 4 MG disintegrating tablet Take 1 tablet (4 mg total) by mouth every 8 (eight) hours as needed for nausea.   polyethylene glycol powder (GLYCOLAX/MIRALAX) 17 GM/SCOOP powder Take 17 g by mouth daily.   Semaglutide (RYBELSUS) 3 MG TABS Take 3 mg by mouth daily.   tamsulosin (FLOMAX) 0.4 MG CAPS capsule Take 0.4 mg by mouth daily.   VYZULTA 0.024 % SOLN Place 1 drop into both eyes at bedtime.   witch hazel-glycerin (TUCKS) pad Apply 1 application topically 2 (two) times daily as needed for itching.   No facility-administered encounter medications on file as of 04/20/2021.    Patient Active Problem List   Diagnosis Date Noted   Malnutrition of moderate degree 02/23/2021   AMS (altered mental status) 02/21/2021   Cognitive impairment [id number]   Acute metabolic encephalopathy [id number]   Sepsis ([place]) 01/15/2021   Acute encephalopathy 12/09/2020   Hyperbilirubinemia 12/09/2020   Hypothyroidism 12/09/2020   Memory changes 10/20/2020   Acute on chronic anemia 09/21/2020   Gastroesophageal reflux disease without esophagitis 09/16/2020   Urinary frequency 09/11/2020   Stage 1 decubitus ulcer 07/30/2020   Uncontrolled type 2 diabetes mellitus with hyperglycemia ([place]) 07/02/2020   Unstable gait 04/03/2018   Frequent falls 04/03/2018   Age-related physical debility 03/22/2018   Hematuria 01/29/2018   Skin ulcer of sacrum, limited to breakdown of skin ([place]) 03/16/2017   Spinal stenosis of lumbar region 01/13/2016   Chronic venous insufficiency 11/09/2015   DDD (degenerative disc disease), lumbosacral 11/24/2014   Constipation by delayed colonic transit 11/24/2014   Frequent UTI 10/29/2012   Type 2 diabetes mellitus with diabetic neuropathy ([place]) 10/19/2006   Hyperlipidemia associated with type 2 diabetes mellitus ([place]) 10/19/2006   Hypertension associated with diabetes ([place]) 10/19/2006    Osteopenia 10/19/2006    Conditions to be addressed/monitored:DMII  Care Plan : Diabetes Type 2 (Adult)  Updates made by Lazaro Arms, RN since 04/20/2021 12:00 AM     Problem: Glycemic Management (Diabetes, Type 2)      Goal: Glycemic Management Optimized   Start Date: 03/10/2021  Expected End Date: 05/21/2021  Recent Progress: On track  Priority: High  Note:   urrent Barriers:  Knowledge Deficits related to plan of care for management of DMII  Chronic Disease Management support and education needs related to DMII  RNCM Clinical Goal(s):  Patient/Daughter will verbalize understanding of plan for management of DMII through collaboration with RN Care manager, provider, and care team.   Interventions: 1:1 collaboration with primary care provider regarding development and update of comprehensive plan of care as evidenced by provider attestation and co-signature Inter-disciplinary care team collaboration (see longitudinal plan of care)   Diabetes:  (Status: Goal on track: YES.) Lab Results  Component Value Date   HGBA1C 6.3 (A) 04/06/2021  Assessed patient's understanding of A1c goal:  patient will remain at current level. Provided education to patient about  basic DM disease process; Reviewed medications with patient and discussed importance of medication adherence; Discussed plans with patient for ongoing care management follow up and provided patient with direct contact information for care management team; Provided patient with verbal information regarding hypo and hyperglycemia and importance of correct treatment; Reviewed scheduled/upcoming provider appointments including: Daughter states that the patient has an appointment today with [facility]; Advised patient, providing education and rationale, to check CBG and record, calling the office for findings outside established parameters; 04/20/21: Spoke with the daughter and she states that her mother is doing fine. She states  that her blood sugars were elevated 2 times once at 199 and today at 238. Other than that they are at the adequate levels. She said her bowels have moved well her with the Miralax.  She has been able to stand and take a few steps.  She is waiting to get a trapeze bar so that she can build her upper body strength, so she can pull herself up.  She will be able to let her legs hang off the side of the bed and move while having something to hold on to for stability.   Patient Goals/Self-Care Activities: Patient/Daughter will self administer medications as prescribed Patient/Daughter will attend all scheduled provider appointments Patient/Daughter will call pharmacy for medication refills Patient /Daughter will call provider office for new concerns or questions  Follow Up Plan: Telephone follow up appointment with care management team member scheduled for: 04/12/21       Lazaro Arms RN, BSN, [facility] Care Management Coordinator [place] Phone: [phone] I Fax: [phone]

## 2021-04-20 NOTE — Patient Instructions (Signed)
Visit Information  Ms. Shagena  it was nice speaking with you. Please call me directly [phone] if you have questions about the goals we discussed.   Goals Addressed             This Visit's Progress    Monitor and Manage My Blood Sugar-Diabetes Type 2       Timeframe:  Long-Range Goal Priority:  High Start Date:    03/10/21                         Expected End Date:     06/21/21              - check blood sugar at prescribed times - check blood sugar if I feel it is too high or too low - enter blood sugar readings and medication or insulin into daily log - take the blood sugar log to all doctor visits    Why is this important?   Checking your blood sugar at home helps to keep it from getting very high or very low.  Writing the results in a diary or log helps the doctor know how to care for you.  Your blood sugar log should have the time, date and the results.  Also, write down the amount of insulin or other medicine that you take.  Other information, like what you ate, exercise done and how you were feeling, will also be helpful.     Notes:        The patient verbalizes understanding of the information and instructions discussed today.  Our next appointment is scheduled for  05/18/21.   Please feel free to call me or the office if you have any questions or concerns.  Lazaro Arms RN, BSN, [facility] Care Management Coordinator [place] Phone: [phone] I Fax: [phone]

## 2021-04-27 ENCOUNTER — Other Ambulatory Visit (HOSPITAL_COMMUNITY): Payer: Self-pay

## 2021-04-30 ENCOUNTER — Telehealth: Payer: Self-pay | Admitting: *Deleted

## 2021-04-30 ENCOUNTER — Ambulatory Visit: Payer: Medicare Other | Admitting: Student

## 2021-04-30 NOTE — Telephone Encounter (Signed)
Daughter called and needed to reschedule patient's appt today due to frequent stools and hemorrhoids flaring.  She has rescheduled for 2 weeks from now with Dr. Gwendlyn Deutscher but wanted to know if she can bring a urine sample in next week.  She is concerned because of patient has very cloudy urine.  Will check with MD to se if a future order can be placed.  Squire Withey,CMA

## 2021-04-30 NOTE — Telephone Encounter (Signed)
Attempted to reach pt to see if she is still having symptoms. No answer. Did LVM stating this and to remind of appt on 9/20 with Dr. Gwendlyn Deutscher. Salvatore Marvel, CMA

## 2021-04-30 NOTE — Telephone Encounter (Signed)
The ATC schedule is full until the 20th. Salvatore Marvel, CMA

## 2021-04-30 NOTE — Progress Notes (Deleted)
    SUBJECTIVE:   CHIEF COMPLAINT / HPI:  Med rec  DM2 Hgb A1c 6.3% 04/06/2021 Eye exam  HTN  Frequent UTI  Memory   PERTINENT  PMH / PSH: ***  OBJECTIVE:   There were no vitals taken for this visit.  ***  ASSESSMENT/PLAN:   No problem-specific Assessment & Plan notes found for this encounter.     [name], MD [place]

## 2021-05-07 DIAGNOSIS — H401123 Primary open-angle glaucoma, left eye, severe stage: Secondary | ICD-10-CM | POA: Diagnosis not present

## 2021-05-07 DIAGNOSIS — E113311 Type 2 diabetes mellitus with moderate nonproliferative diabetic retinopathy with macular edema, right eye: Secondary | ICD-10-CM | POA: Diagnosis not present

## 2021-05-07 DIAGNOSIS — E113312 Type 2 diabetes mellitus with moderate nonproliferative diabetic retinopathy with macular edema, left eye: Secondary | ICD-10-CM | POA: Diagnosis not present

## 2021-05-11 ENCOUNTER — Telehealth: Payer: Self-pay | Admitting: Family Medicine

## 2021-05-11 ENCOUNTER — Encounter: Payer: Self-pay | Admitting: Family Medicine

## 2021-05-11 ENCOUNTER — Ambulatory Visit (INDEPENDENT_AMBULATORY_CARE_PROVIDER_SITE_OTHER): Admitting: Family Medicine

## 2021-05-11 ENCOUNTER — Other Ambulatory Visit: Payer: Self-pay

## 2021-05-11 VITALS — BP 141/80 | HR 99 | Ht 61.0 in

## 2021-05-11 DIAGNOSIS — R8290 Unspecified abnormal findings in urine: Secondary | ICD-10-CM | POA: Diagnosis not present

## 2021-05-11 DIAGNOSIS — L299 Pruritus, unspecified: Secondary | ICD-10-CM

## 2021-05-11 DIAGNOSIS — Z23 Encounter for immunization: Secondary | ICD-10-CM

## 2021-05-11 DIAGNOSIS — I152 Hypertension secondary to endocrine disorders: Secondary | ICD-10-CM

## 2021-05-11 DIAGNOSIS — E1159 Type 2 diabetes mellitus with other circulatory complications: Secondary | ICD-10-CM

## 2021-05-11 DIAGNOSIS — N390 Urinary tract infection, site not specified: Secondary | ICD-10-CM

## 2021-05-11 LAB — POCT UA - MICROSCOPIC ONLY
RBC, Urine, Miroscopic: >20 (ref 0–2)
WBC, Ur, HPF, POC: >20 (ref 0–5)

## 2021-05-11 LAB — POCT URINALYSIS DIP (MANUAL ENTRY)
Bilirubin, UA: NEGATIVE
Glucose, UA: 250 mg/dL — AB
Ketones, POC UA: NEGATIVE mg/dL
Nitrite, UA: POSITIVE — AB
Protein Ur, POC: 30 mg/dL — AB
Spec Grav, UA: 1.015 (ref 1.010–1.025)
Urobilinogen, UA: 0.2 E.U./dL
pH, UA: 7 (ref 5.0–8.0)

## 2021-05-11 NOTE — Assessment & Plan Note (Signed)
Presents only with cloudy urine with odor. UA and urine culture obtained during this visit. I called patient about her UA result. She asked that I call her daughters instead. Result discussed with [name] and [name]. UA is abnormal - urine sent for culture. As discussed with her daughters, based on the McGeer's criteria, she does not meet the criteria for a true UTI. She has an indwelling catheter, no fever, no dysuria, so suprapubic pain, no change in her mental status. Her urine culture might be positive which will meet the definition of asymptomatic bacteriuria. In this cause, A/B therapy is not recommended. The daughters verbalized understanding. I will reach out again with culture results. For now we will monitor closely for UTI symptoms.

## 2021-05-11 NOTE — Patient Instructions (Signed)
Indwelling Urinary Catheter Care, Adult An indwelling urinary catheter is a thin tube that is put into your bladder. The tube helps to drain pee (urine) out of your body. The tube goes in through your urethra. Your urethra is where pee comes out of your body. Your pee will come out through the catheter, then it will go into a bag (drainage bag). Take good care of your catheter so it will work well. How to wear your catheter and bag Supplies needed Sticky tape (adhesive tape) or a leg strap. Alcohol wipe or soap and water (if you use tape). A clean towel (if you use tape). Large overnight bag. Smaller bag (leg bag). Wearing your catheter Attach your catheter to your leg with tape or a leg strap. Make sure the catheter is not pulled tight. If a leg strap gets wet, take it off and put on a dry strap. If you use tape to hold the bag on your leg: Use an alcohol wipe or soap and water to wash your skin where the tape made it sticky before. Use a clean towel to pat-dry that skin. Use new tape to make the bag stay on your leg. Wearing your bags You should have been given a large overnight bag. You may wear the overnight bag in the day or night. Always have the overnight bag lower than your bladder.  Do not let the bag touch the floor. Before you go to sleep, put a clean plastic bag in a wastebasket. Then hang the overnight bag inside the wastebasket. You should also have a smaller leg bag that fits under your clothes. Always wear the leg bag below your knee. Do not wear your leg bag at night. How to care for your skin and catheter Supplies needed A clean washcloth. Water and mild soap. A clean towel. Caring for your skin and catheter   Clean the skin around your catheter every day: Wash your hands with soap and water. Wet a clean washcloth in warm water and mild soap. Clean the skin around your urethra. If you are female: Gently spread the folds of skin around your vagina (labia). With  the washcloth in your other hand, wipe the inner side of your labia on each side. Wipe from front to back. If you are female: Pull back any skin that covers the end of your penis (foreskin). With the washcloth in your other hand, wipe your penis in small circles. Start wiping at the tip of your penis, then move away from the catheter. Move the foreskin back in place, if needed. With your free hand, hold the catheter close to where it goes into your body. Keep holding the catheter during cleaning so it does not get pulled out. With the washcloth in your other hand, clean the catheter. Only wipe downward on the catheter, toward the drainage bag. Do not wipe upward toward your body. Doing this may push germs into your urethra and cause infection. Use a clean towel to pat-dry the catheter and the skin around it. Make sure to wipe off all soap. Wash your hands with soap and water. Shower every day. Do not take baths. Do not use cream, ointment, or lotion on the area where the catheter goes into your body, unless your doctor tells you to. Do not use powders, sprays, or lotions on your genital area. Check your skin around the catheter every day for signs of infection. Check for: Redness, swelling, or pain. Fluid or blood. Warmth. Pus or a  bad smell. How to empty the bag Supplies needed Rubbing alcohol. Gauze pad or cotton ball. Tape or a leg strap. Emptying the bag Pour the pee out of your bag when it is ?- full, or at least 2-3 times a day. Do this for your overnight bag and your leg bag. Wash your hands with soap and water. Separate (detach) the bag from your leg. Hold the bag over the toilet or a clean pail. Keep the bag lower than your hips and bladder. This is so the pee (urine) does not go back into the tube. Open the pour spout. It is at the bottom of the bag. Empty the pee into the toilet or pail. Do not let the pour spout touch any surface. Put rubbing alcohol on a gauze pad or  cotton ball. Use the gauze pad or cotton ball to clean the pour spout. Close the pour spout. Attach the bag to your leg with tape or a leg strap. Wash your hands with soap and water. Follow instructions for cleaning the drainage bag: From the product maker. As told by your doctor. How to change the bag Changing the bag Replace your bag when it starts to leak, smell bad, or look dirty. Wash your hands with soap and water. Separate the dirty bag from your leg. Pinch the catheter with your fingers so that pee does not spill out. Separate the catheter tube from the bag tube where these tubes connect (at the connection valve). Do not let the tubes touch any surface. Clean the end of the catheter tube with an alcohol wipe. Use a different alcohol wipe to clean the end of the bag tube. Connect the catheter tube to the tube of the clean bag. Attach the clean bag to your leg with tape or a leg strap. Do not make the bag tight on your leg. Wash your hands with soap and water. General rules  Never pull on your catheter. Never try to take it out. Doing that can hurt you. Always wash your hands before and after you touch your catheter or bag. Use a mild, fragrance-free soap. If you do not have soap and water, use hand sanitizer. Always make sure there are no twists or bends (kinks) in the catheter tube. Always make sure there are no leaks in the catheter or bag. Drink enough fluid to keep your pee pale yellow. Do not take baths, swim, or use a hot tub. If you are female, wipe from front to back after you poop (have a bowel movement). Contact a doctor if: Your pee is cloudy. Your pee smells worse than usual. Your catheter gets clogged. Your catheter leaks. Your bladder feels full. Get help right away if: You have redness, swelling, or pain where the catheter goes into your body. You have fluid, blood, pus, or a bad smell coming from the area where the catheter goes into your body. Your skin  feels warm where the catheter goes into your body. You have a fever. You have pain in your: Belly (abdomen). Legs. Lower back. Bladder. You see blood in the catheter. Your pee is pink or red. You feel sick to your stomach (nauseous). You throw up (vomit). You have chills. Your pee is not draining into the bag. Your catheter gets pulled out. Summary An indwelling urinary catheter is a thin tube that is placed into the bladder to help drain pee (urine) out of the body. The catheter is placed into the part of the body that drains  pee from the bladder (urethra). Taking good care of your catheter will keep it working properly and help prevent problems. Always wash your hands before and after touching your catheter or bag. Never pull on your catheter or try to take it out. This information is not intended to replace advice given to you by your health care provider. Make sure you discuss any questions you have with your health care provider. Document Revised: 10/28/2020 Document Reviewed: 07/24/2020 Elsevier Patient Education  2022 [employer].

## 2021-05-11 NOTE — Assessment & Plan Note (Signed)
BP elevated initially with improved repeat BP check. No adjustment to her regimen for now.

## 2021-05-11 NOTE — Assessment & Plan Note (Addendum)
No known exposure. Likely environmental allergies. I called to confirmed the medication she was on and her daughter read out Hydroxyzine. I advised her daughters to d/c hydroxyzine since it is a BEER's medication for geriatric patients. She has Zyrtec 10 mg at home which she does not take regularly. I advised her to start Zyrtec instead and monitor symptoms closely. I have updated her record with Zyrtec F/U as needed.

## 2021-05-11 NOTE — Telephone Encounter (Signed)
Patient has appt with Dr. Gwendlyn Deutscher on 9/20. Salvatore Marvel, CMA

## 2021-05-11 NOTE — Telephone Encounter (Signed)
I called patient about her UA result. She asked that I call her daughters instead. Result discussed with [name] and [name]. UA is abnormal - urine sent for culture. As discussed with her daughters, based on the McGeer's criteria, she does not meet the criteria for a true UTI. She has an indwelling catheter, no fever, no dysuria, so suprapubic pain, no change in her mental status. Her urine culture might be positive which will meet the definition of asymptomatic bacteriuria. In this cause, A/B therapy is not recommended. The daughters verbalized understanding. I will reach out again with culture results. Fo now we will monitor closely for UTI symptoms.

## 2021-05-11 NOTE — Progress Notes (Signed)
SUBJECTIVE:   CHIEF COMPLAINT / HPI: Her second daughter [name] connected to this visit virtually  UTI symptoms: The patient was brought in by her daughter, Adria Dill for cloudy urine with an odor. Denies hematuria, no fever, no suprapubic pain, no N/V. Denies any symptoms at this time. She had her current catheter replaced about two months ago, and a visiting nurse comes home to flush the catheter regularly. The indication for catheter placement was urinary retention. She is under the care of a urologist and saw them last about two months ago. No other GU concerns.   HTN: On Enalapril, her last dose was this morning at 9 AM. Home BP is normal.  Pruritus:  C/O generalized pruritis for months. No new change in medication. The visiting nurse recommended hydroxyzine which does not help a lot. She also uses Sarna antiitch cream, which is over-the-counter. No skin lesions otherwise.  PERTINENT  PMH / PSH: PMHx reviewed  OBJECTIVE:   Vitals:   05/11/21 1059 05/11/21 1121  BP: (!) 163/87 (!) 141/80  Pulse: 99   SpO2: 100%   Height: 5\' 1"  (1.549 m)     Physical Exam Constitutional:      Appearance: Normal appearance.     Comments: Pleasant elderly lady  Cardiovascular:     Rate and Rhythm: Normal rate and regular rhythm.     Heart sounds: Normal heart sounds. No murmur heard.   No friction rub.  Pulmonary:     Effort: Pulmonary effort is normal. No respiratory distress.     Breath sounds: Normal breath sounds. No wheezing or rhonchi.  Abdominal:     General: Abdomen is flat. Bowel sounds are normal. There is no distension.     Palpations: Abdomen is soft. There is no mass.     Tenderness: There is no abdominal tenderness. There is no guarding.  Musculoskeletal:     Right lower leg: No edema.     Left lower leg: No edema.  Skin:    General: Skin is warm and dry.     ASSESSMENT/PLAN:   Frequent UTI Presents only with cloudy urine with odor. UA and urine culture obtained  during this visit. I called patient about her UA result. She asked that I call her daughters instead. Result discussed with [name] and [name]. UA is abnormal - urine sent for culture. As discussed with her daughters, based on the McGeer's criteria, she does not meet the criteria for a true UTI. She has an indwelling catheter, no fever, no dysuria, so suprapubic pain, no change in her mental status. Her urine culture might be positive which will meet the definition of asymptomatic bacteriuria. In this cause, A/B therapy is not recommended. The daughters verbalized understanding. I will reach out again with culture results. For now we will monitor closely for UTI symptoms.  Hypertension associated with diabetes ([place]) BP elevated initially with improved repeat BP check. No adjustment to her regimen for now.   Pruritus No known exposure. Likely environmental allergies. I called to confirmed the medication she was on and her daughter read out Hydroxyzine. I advised her daughters to d/c hydroxyzine since it is a BEER's medication for geriatric patients. She has Zyrtec 10 mg at home which she does not take regularly. I advised her to start Zyrtec instead and monitor symptoms closely. I have updated her record with Zyrtec F/U as needed.    Flu shot was given during this visit.   More than 50% of this 35 minutes appointment  was spent on counseling, coordination of care and post visit telephone encounter.  Andrena Mews, MD [place]

## 2021-05-17 ENCOUNTER — Telehealth: Payer: Self-pay | Admitting: Family Medicine

## 2021-05-17 LAB — URINE CULTURE

## 2021-05-17 NOTE — Telephone Encounter (Signed)
I called and discussed the final urine culture report with [name].  Klebsiella +  Patient remains asymptomatic. No treatment recommended. I advised [name] to contact us should she develop fever, suprapubic pain or dysuria. She agreed with the plan.

## 2021-05-17 NOTE — Telephone Encounter (Signed)
Urine culture status discussed - still in process from 05/11/21. So far, she does not have dysuria or suprapubic pain.  As discussed with [name] again, we can safely monitor for now, even if urine culture returns positive with an organism. Return precaution discussed. She verbalized understanding and agreed with the plan.

## 2021-05-18 ENCOUNTER — Ambulatory Visit: Payer: Medicare Other

## 2021-05-18 DIAGNOSIS — M48061 Spinal stenosis, lumbar region without neurogenic claudication: Secondary | ICD-10-CM | POA: Diagnosis not present

## 2021-05-18 DIAGNOSIS — M6281 Muscle weakness (generalized): Secondary | ICD-10-CM | POA: Diagnosis not present

## 2021-05-18 NOTE — Patient Instructions (Signed)
Visit Information  Ms. Kiner  it was nice speaking with you. Please call me directly [phone] if you have questions about the goals we discussed.   Goals Addressed             This Visit's Progress    Monitor and Manage My Blood Sugar-Diabetes Type 2       Timeframe:  Long-Range Goal Priority:  High Start Date:    03/10/21                         Expected End Date:    06/21/21          - check blood sugar at prescribed times - check blood sugar if I feel it is too high or too low - enter blood sugar readings and medication or insulin into daily log - take the blood sugar log to all doctor visits    Why is this important?   Checking your blood sugar at home helps to keep it from getting very high or very low.  Writing the results in a diary or log helps the doctor know how to care for you.  Your blood sugar log should have the time, date and the results.  Also, write down the amount of insulin or other medicine that you take.  Other information, like what you ate, exercise done and how you were feeling, will also be helpful.     Notes:          The patient verbalizes understanding of the information and instructions discussed today.  Our next appointment is scheduled for  06/29/21.   Please feel free to call me or the office if you have any questions or concerns.  Lazaro Arms RN, BSN, [facility] Care Management Coordinator [place] Phone: [phone] I Fax: [phone]

## 2021-05-18 NOTE — Chronic Care Management (AMB) (Signed)
Chronic Care Management   CCM RN Visit Note  05/18/2021 Name: Hayley Fisher MRN: [id number] DOB: [birth year]  Subjective: Hayley Fisher is a 87 y.o. year old female who is a primary care patient of [name], MD. The care management team was consulted for assistance with disease management and care coordination needs.    Engaged with patient by telephone for follow up visit in response to provider referral for case management and/or care coordination services.   Consent to Services:  The patient was given information about Chronic Care Management services, agreed to services, and gave verbal consent prior to initiation of services.  Please see initial visit note for detailed documentation.   Patient agreed to services and verbal consent obtained.    Assessment: Patient's daughter provided all information during this encounter. The patient continues to maintain positive progress with care plan goals.. See Care Plan below for interventions and patient self-care actives. Follow up Plan: Patient would like continued follow-up.  CCM RNCM will outreach the patient within the next 6 weeks.  Patient will call office if needed prior to next encounter  Review of patient past medical history, allergies, medications, health status, including review of consultants reports, laboratory and other test data, was performed as part of comprehensive evaluation and provision of chronic care management services.   SDOH (Social Determinants of Health) assessments and interventions performed:    CCM Care Plan  Allergies  Allergen Reactions   Levemir [Insulin Detemir] Itching    Outpatient Encounter Medications as of 05/18/2021  Medication Sig   Accu-Chek Softclix Lancets lancets 1 each by Other route See admin instructions. USE TO TEST BLOOD SUGAR UP  TO 3 TIMES A DAY.   aspirin 81 MG chewable tablet Chew 81 mg by mouth every morning.   atorvastatin (LIPITOR) 40 MG tablet Take 1 tablet (40 mg total)  by mouth daily.   brimonidine (ALPHAGAN P) 0.1 % SOLN Place 1 drop into both eyes 2 (two) times daily.   carbamide peroxide (DEBROX) 6.5 % OTIC solution Place 2 drops into both ears 2 (two) times daily as needed (ear wax).   Catheter Self-Adhesive Urinary MISC 1 patch by Does not apply route daily.   cetirizine (ZYRTEC) 10 MG tablet Take 10 mg by mouth daily.   docusate sodium (COLACE) 100 MG capsule Take 1 capsule (100 mg total) by mouth 2 (two) times daily.   dorzolamide-timolol (COSOPT) 22.3-6.8 MG/ML ophthalmic solution Place 1 drop into both eyes 2 (two) times daily.   DULoxetine (CYMBALTA) 60 MG capsule Take 1 capsule (60 mg total) by mouth daily.   enalapril (VASOTEC) 20 MG tablet Take 1 tablet (20 mg total) by mouth daily.   famotidine (PEPCID) 20 MG tablet Take 1 tablet (20 mg total) by mouth daily.   ferrous sulfate 325 (65 FE) MG tablet Take 325 mg by mouth daily with breakfast.   glucose blood (ACCU-CHEK AVIVA PLUS) test strip 1 each by Other route See admin instructions. USE TO TEST BLOOD SUGAR UP  TO 3 TIMES A DAY.   insulin glargine (LANTUS) 100 unit/mL SOPN Inject 10 Units into the skin daily.   insulin glargine-yfgn (SEMGLEE) 100 UNIT/ML Pen Inject 15 Units into the skin at bedtime. (Patient not taking: Reported on 05/11/2021)   insulin lispro (ADMELOG SOLOSTAR) 100 UNIT/ML KwikPen Inject 2-15 Units into the skin as needed with sliding scale. Max dose is 15 units daily (Patient not taking: Reported on 05/11/2021)   Insulin Pen Needle 32G X 4 MM MISC  Use as directed with insulin   Insulin Pen Needle 32G X 4 MM MISC Use as directed   Insulin Syringe-Needle U-100 (SURE COMFORT INSULIN SYRINGE) 31G X 5/16" 0.3 ML MISC Inject 1 Syringe into the skin daily.   levothyroxine (SYNTHROID) 50 MCG tablet Take 1 tablet (50 mcg total) by mouth daily.   metFORMIN (GLUCOPHAGE-XR) 500 MG 24 hr tablet Take 2 tablets (1,000 mg total) by mouth daily with breakfast.   Misc. Devices (TRANSFER BOARD)  MISC Use as needed for transfer from bed to chair or vice versa   Multiple Vitamins-Minerals (PRESERVISION AREDS 2+MULTI VIT) CAPS Take 1 capsule by mouth daily.   MYRBETRIQ 25 MG TB24 tablet TAKE 1 TABLET BY MOUTH DAILY   ondansetron (ZOFRAN ODT) 4 MG disintegrating tablet Take 1 tablet (4 mg total) by mouth every 8 (eight) hours as needed for nausea.   polyethylene glycol powder (GLYCOLAX/MIRALAX) 17 GM/SCOOP powder Take 17 g by mouth daily.   Semaglutide (RYBELSUS) 3 MG TABS Take 3 mg by mouth daily.   tamsulosin (FLOMAX) 0.4 MG CAPS capsule Take 0.4 mg by mouth daily.   VYZULTA 0.024 % SOLN Place 1 drop into both eyes at bedtime.   witch hazel-glycerin (TUCKS) pad Apply 1 application topically 2 (two) times daily as needed for itching.   No facility-administered encounter medications on file as of 05/18/2021.    Patient Active Problem List   Diagnosis Date Noted   Malnutrition of moderate degree 02/23/2021   AMS (altered mental status) 02/21/2021   Cognitive impairment 64/33/2951   Acute metabolic encephalopathy 88/41/6606   Sepsis ([place]) 01/15/2021   Acute encephalopathy 12/09/2020   Hyperbilirubinemia 12/09/2020   Hypothyroidism 12/09/2020   Memory changes 10/20/2020   Acute on chronic anemia 09/21/2020   Gastroesophageal reflux disease without esophagitis 09/16/2020   Urinary frequency 09/11/2020   Stage 1 decubitus ulcer 07/30/2020   Uncontrolled type 2 diabetes mellitus with hyperglycemia ([place]) 07/02/2020   Unstable gait 04/03/2018   Frequent falls 04/03/2018   Age-related physical debility 03/22/2018   Hematuria 01/29/2018   Skin ulcer of sacrum, limited to breakdown of skin ([place]) 03/16/2017   Spinal stenosis of lumbar region 01/13/2016   Chronic venous insufficiency 11/09/2015   DDD (degenerative disc disease), lumbosacral 11/24/2014   Constipation by delayed colonic transit 11/24/2014   Frequent UTI 10/29/2012   Pruritus 03/17/2011   Type 2 diabetes mellitus with  diabetic neuropathy ([place]) 10/19/2006   Hyperlipidemia associated with type 2 diabetes mellitus ([place]) 10/19/2006   Hypertension associated with diabetes ([place]) 10/19/2006   Osteopenia 10/19/2006    Conditions to be addressed/monitored:DMII  Care Plan : Diabetes Type 2 (Adult)  Updates made by Lazaro Arms, RN since 05/18/2021 12:00 AM     Problem: Glycemic Management (Diabetes, Type 2)      Goal: Glycemic Management Optimized   Start Date: 03/10/2021  Expected End Date: 05/21/2021  Recent Progress: On track  Priority: High  Note:   urrent Barriers:  Knowledge Deficits related to plan of care for management of DMII  Chronic Disease Management support and education needs related to DMII  RNCM Clinical Goal(s):  Patient/Daughter will verbalize understanding of plan for management of DMII through collaboration with RN Care manager, provider, and care team.   Interventions: 1:1 collaboration with primary care provider regarding development and update of comprehensive plan of care as evidenced by provider attestation and co-signature Inter-disciplinary care team collaboration (see longitudinal plan of care)   Diabetes:  (Status: Goal on track: YES.) Lab  Results  Component Value Date   HGBA1C 6.3 (A) 04/06/2021  Assessed patient's understanding of A1c goal:  patient will remain at current level. Provided education to patient about basic DM disease process; Reviewed medications with patient and discussed importance of medication adherence; Discussed plans with patient for ongoing care management follow up and provided patient with direct contact information for care management team; Provided patient with verbal information regarding hypo and hyperglycemia and importance of correct treatment; Reviewed scheduled/upcoming provider appointments including: Daughter states that the patient has an appointment today with [facility]; Advised patient, providing education and rationale, to check  CBG and record, calling the office for findings outside established parameters; 05/18/21: I spoke with Dallas Breeding Zwicker's daughter, and she stated that the patient is doing fine. She has a hearty appetite--no more issues with UTI. Her blood sugar has been great. Today 98, and they have been ranging from 120-199. She states that she and her sister are taking turns caring for her so they will not get burned out. Advised her to continue to monitor her blood sugars and give her medications as prescribed.     Patient Goals/Self-Care Activities: Patient/Daughter will self administer medications as prescribed Patient/Daughter will attend all scheduled provider appointments Patient/Daughter will call pharmacy for medication refills Patient /Daughter will call provider office for new concerns or questions       Lazaro Arms RN, BSN, [facility] Care Management Coordinator [place] Phone: [phone] I Fax: [phone]

## 2021-05-24 ENCOUNTER — Telehealth: Payer: Self-pay | Admitting: Student

## 2021-05-24 NOTE — Telephone Encounter (Signed)
Dr. Tomasa Hosteller from [facility] is calling to speak with Dr. Jinny Sanders concerning mutual patients care.   The best call back is [phone]

## 2021-05-24 NOTE — Telephone Encounter (Signed)
Routed message to PCP. Hancel Ion, CMA  

## 2021-05-25 NOTE — Telephone Encounter (Signed)
Hello,  Agree with palliative care with Hayley Fisher orders and you can call and inform Dr. Tomasa Hosteller. Please have patient follow up with me about this. Thank you so much.  [place] [facility]

## 2021-05-25 NOTE — Telephone Encounter (Signed)
Dr. Tomasa Hosteller calls again to nurse line. He does not feel the patient is an appropriate candidate for hospice. He asks we "drop" her down to palliative care with [facility] orders. If agreeable to this I will call him to inform, if not, please call him to discuss.

## 2021-05-26 ENCOUNTER — Telehealth: Payer: Self-pay

## 2021-05-26 DIAGNOSIS — H524 Presbyopia: Secondary | ICD-10-CM | POA: Diagnosis not present

## 2021-05-26 DIAGNOSIS — H401123 Primary open-angle glaucoma, left eye, severe stage: Secondary | ICD-10-CM | POA: Diagnosis not present

## 2021-05-26 DIAGNOSIS — R54 Age-related physical debility: Secondary | ICD-10-CM

## 2021-05-26 DIAGNOSIS — E113312 Type 2 diabetes mellitus with moderate nonproliferative diabetic retinopathy with macular edema, left eye: Secondary | ICD-10-CM | POA: Diagnosis not present

## 2021-05-26 DIAGNOSIS — E113311 Type 2 diabetes mellitus with moderate nonproliferative diabetic retinopathy with macular edema, right eye: Secondary | ICD-10-CM | POA: Diagnosis not present

## 2021-05-26 NOTE — Telephone Encounter (Signed)
Dr. Tomasa Hosteller has been informed to drop down to palliative care.

## 2021-05-26 NOTE — Telephone Encounter (Signed)
Pt is being released from Hospice care. Pt needs an order sent to [facility] for the equipment she has in her home.   Hospital bed with 1/2 rails Over bed table Alternating pressure pad  Trapeze bar.  Ottis Stain, CMA

## 2021-05-26 NOTE — Telephone Encounter (Signed)
Placed DME order for patient. Let me know if any issues

## 2021-05-28 NOTE — Telephone Encounter (Signed)
Community message sent to [employer]. Will await response.   Mylo Driskill C Lasheika Ortloff, RN  

## 2021-05-31 ENCOUNTER — Telehealth: Payer: Self-pay

## 2021-05-31 NOTE — Telephone Encounter (Signed)
Spoke with patient's daughter [name] and scheduled an in-person Palliative Consult for 06/10/21 @ 12:30PM.  COVID screening was negative. No pets in home. Patient lives with sister and both daughters care for patient.   Consent obtained; updated Outlook/Netsmart/Team List and Epic.   Family is aware they may be receiving a call from provider the day before or day of to confirm appointment.

## 2021-05-31 NOTE — Telephone Encounter (Signed)
Please see below message from [employer].   Received! Thank you!   Talbot Grumbling, RN

## 2021-06-02 DIAGNOSIS — N3020 Other chronic cystitis without hematuria: Secondary | ICD-10-CM | POA: Diagnosis not present

## 2021-06-02 DIAGNOSIS — R3914 Feeling of incomplete bladder emptying: Secondary | ICD-10-CM | POA: Diagnosis not present

## 2021-06-09 ENCOUNTER — Ambulatory Visit (INDEPENDENT_AMBULATORY_CARE_PROVIDER_SITE_OTHER): Payer: Medicare Other | Admitting: Endocrinology

## 2021-06-09 ENCOUNTER — Encounter: Payer: Self-pay | Admitting: Student

## 2021-06-09 ENCOUNTER — Other Ambulatory Visit: Payer: Self-pay

## 2021-06-09 VITALS — BP 148/96 | HR 96 | Ht 61.0 in

## 2021-06-09 DIAGNOSIS — E1140 Type 2 diabetes mellitus with diabetic neuropathy, unspecified: Secondary | ICD-10-CM | POA: Diagnosis not present

## 2021-06-09 DIAGNOSIS — Z794 Long term (current) use of insulin: Secondary | ICD-10-CM

## 2021-06-09 LAB — POCT GLYCOSYLATED HEMOGLOBIN (HGB A1C): Hemoglobin A1C: 8 % — AB (ref 4.0–5.6)

## 2021-06-09 NOTE — Progress Notes (Signed)
Subjective:    Patient ID: Hayley Fisher, female    DOB: [birth year], 87 y.o.   MRN: [id number]  HPI Pt returns for f/u of diabetes mellitus:  DM type: Insulin-requiring type 2  Dx'ed: 8416 Complications: DR and PN  Therapy: insulin since 2021, and 2 oral meds.  GDM: never DKA: never Severe hypoglycemia: never Pancreatitis: never Pancreatic imaging: normal on 2021 CT SDOH: dtr Hayley Fisher gives pt her insulin, and checks pt's cbg.   Other: She prefers vial over pen Interval history: no cbg record, but states cbg's varies from 89-244.  It is in general higher as the day goes on.  Pt is here with dtr Hayley Fisher.  Dtr Hayley Fisher is on the phone.   Pt also has chronic primary hypothyroidism (she has never had thyroid imaging).  She takes synthroid as rx'ed.   Past Medical History:  Diagnosis Date   Arthritis    Arthritis    Cataract    bil cateracts removed   Chronic kidney disease    "spot on one of my kidneys" per pt   Diabetes mellitus    Glaucoma    Hyperkalemia 07/02/2020   Hyperlipidemia    Hyperplastic colon polyp    Hypertension    Hypothyroidism 01/26/3015   Metabolic acidosis 08/30/3233   Syncope    Thyroid disease     Past Surgical History:  Procedure Laterality Date   ABDOMINAL HYSTERECTOMY     BLADDER SUSPENSION     bladder tacking     COLONOSCOPY     EYE SURGERY     INGUINAL HERNIA REPAIR Right    IR IVC FILTER PLMT / S&I /IMG GUID/MOD SED  01/16/2021    Social History   Socioeconomic History   Marital status: Single    Spouse name: Not on file   Number of children: 4   Years of education: Not on file   Highest education level: Not on file  Occupational History   Occupation: retired  Tobacco Use   Smoking status: Former    Types: Cigarettes    Quit date: 11/29/1980    Years since quitting: 40.5   Smokeless tobacco: Never  Vaping Use   Vaping Use: Never used  Substance and Sexual Activity   Alcohol use: No    Alcohol/week: 0.0 standard  drinks   Drug use: No   Sexual activity: Yes    Birth control/protection: None  Other Topics Concern   Not on file  Social History Narrative   Not on file   Social Determinants of Health   Financial Resource Strain: Not on file  Food Insecurity: Not on file  Transportation Needs: Not on file  Physical Activity: Not on file  Stress: Not on file  Social Connections: Not on file  Intimate Partner Violence: Not on file    Current Outpatient Medications on File Prior to Visit  Medication Sig Dispense Refill   Accu-Chek Softclix Lancets lancets 1 each by Other route See admin instructions. USE TO TEST BLOOD SUGAR UP  TO 3 TIMES A DAY.     aspirin 81 MG chewable tablet Chew 81 mg by mouth every morning.     atorvastatin (LIPITOR) 40 MG tablet Take 1 tablet (40 mg total) by mouth daily. 90 tablet 3   brimonidine (ALPHAGAN P) 0.1 % SOLN Place 1 drop into both eyes 2 (two) times daily.     carbamide peroxide (DEBROX) 6.5 % OTIC solution Place 2 drops into both ears 2 (two) times daily  as needed (ear wax).     Catheter Self-Adhesive Urinary MISC 1 patch by Does not apply route daily. 12 each 0   cetirizine (ZYRTEC) 10 MG tablet Take 10 mg by mouth daily.     docusate sodium (COLACE) 100 MG capsule Take 1 capsule (100 mg total) by mouth 2 (two) times daily. 10 capsule 0   dorzolamide-timolol (COSOPT) 22.3-6.8 MG/ML ophthalmic solution Place 1 drop into both eyes 2 (two) times daily.     DULoxetine (CYMBALTA) 60 MG capsule Take 1 capsule (60 mg total) by mouth daily.     enalapril (VASOTEC) 20 MG tablet Take 1 tablet (20 mg total) by mouth daily. 90 tablet 3   famotidine (PEPCID) 20 MG tablet Take 1 tablet (20 mg total) by mouth daily.     ferrous sulfate 325 (65 FE) MG tablet Take 325 mg by mouth daily with breakfast.     glucose blood (ACCU-CHEK AVIVA PLUS) test strip 1 each by Other route See admin instructions. USE TO TEST BLOOD SUGAR UP  TO 3 TIMES A DAY.     insulin glargine (LANTUS) 100  unit/mL SOPN Inject 10 Units into the skin daily. 15 mL 1   Insulin Pen Needle 32G X 4 MM MISC Use as directed with insulin 100 each 2   Insulin Syringe-Needle U-100 (SURE COMFORT INSULIN SYRINGE) 31G X 5/16" 0.3 ML MISC Inject 1 Syringe into the skin daily.     levothyroxine (SYNTHROID) 50 MCG tablet Take 1 tablet (50 mcg total) by mouth daily. 90 tablet 3   metFORMIN (GLUCOPHAGE-XR) 500 MG 24 hr tablet Take 2 tablets (1,000 mg total) by mouth daily with breakfast. 180 tablet 3   Misc. Devices (TRANSFER BOARD) MISC Use as needed for transfer from bed to chair or vice versa 1 each 0   Multiple Vitamins-Minerals (PRESERVISION AREDS 2+MULTI VIT) CAPS Take 1 capsule by mouth daily.     MYRBETRIQ 25 MG TB24 tablet TAKE 1 TABLET BY MOUTH DAILY 30 tablet 1   ondansetron (ZOFRAN ODT) 4 MG disintegrating tablet Take 1 tablet (4 mg total) by mouth every 8 (eight) hours as needed for nausea. 10 tablet 0   polyethylene glycol powder (GLYCOLAX/MIRALAX) 17 GM/SCOOP powder Take 17 g by mouth daily. 255 g 0   Semaglutide (RYBELSUS) 3 MG TABS Take 3 mg by mouth daily. 30 tablet 11   tamsulosin (FLOMAX) 0.4 MG CAPS capsule Take 0.4 mg by mouth daily.     VYZULTA 0.024 % SOLN Place 1 drop into both eyes at bedtime.  0   witch hazel-glycerin (TUCKS) pad Apply 1 application topically 2 (two) times daily as needed for itching.     No current facility-administered medications on file prior to visit.    Allergies  Allergen Reactions   Levemir [Insulin Detemir] Itching    Family History  Problem Relation Age of Onset   Colon cancer Mother    Other Father        cerebral hemorrhage   Diabetes Brother    Diabetes Sister        x 2   Bone cancer Daughter    Breast cancer Daughter 46   Esophageal cancer Neg Hx    Rectal cancer Neg Hx    Stomach cancer Neg Hx     BP (!) 148/96 (BP Location: Right Arm, Patient Position: Sitting, Cuff Size: Normal)   Pulse 96   Ht 5\' 1"  (1.549 m)   SpO2 93%   BMI 23.54  kg/m  Review of Systems No recent nausea.    Objective:   Physical Exam Pulses: dorsalis pedis intact bilat.   MSK: no deformity of the feet CV: 2+ bilat leg edema Skin:  no ulcer on the feet.  normal color and temp on the feet. Neuro: sensation is intact to touch on the feet.    Lab Results  Component Value Date   CREATININE 0.98 02/24/2021   BUN 24 (H) 02/24/2021   NA 136 02/24/2021   K 4.5 02/24/2021   CL 97 (L) 02/24/2021   CO2 31 02/24/2021    Lab Results  Component Value Date   TSH 4.435 02/26/2021   A1c=8.0%    Assessment & Plan:  Insulin-requiring type 2 DM: uncontrolled.  Due to frail elderly state, we decided to continue the same  Patient Instructions  check your blood sugar 4 times a day.  vary the time of day when you check, between before the 3 meals, and at bedtime.  also check if you have symptoms of your blood sugar being too high or too low.  please keep a record of the readings and bring it to your next appointment here (or you can bring the meter itself).  You can write it on any piece of paper.  please call us sooner if your blood sugar goes below 70, or if most of your readings are over 200.   Please stop taking the Humalog.   I have sent a prescription to your pharmacy, to change metformin to extended-release.   Please continue the same Rybelsus.    Please come back for a follow-up appointment in 3 months.

## 2021-06-09 NOTE — Progress Notes (Signed)
C-

## 2021-06-09 NOTE — Patient Instructions (Addendum)
check your blood sugar 4 times a day.  vary the time of day when you check, between before the 3 meals, and at bedtime.  also check if you have symptoms of your blood sugar being too high or too low.  please keep a record of the readings and bring it to your next appointment here (or you can bring the meter itself).  You can write it on any piece of paper.  please call us sooner if your blood sugar goes below 70, or if most of your readings are over 200.   Please stop taking the Humalog.   I have sent a prescription to your pharmacy, to change metformin to extended-release.   Please continue the same Rybelsus.    Please come back for a follow-up appointment in 3 months.

## 2021-06-10 ENCOUNTER — Other Ambulatory Visit: Payer: Medicare Other | Admitting: Hospice

## 2021-06-10 ENCOUNTER — Other Ambulatory Visit: Payer: Self-pay | Admitting: Student

## 2021-06-10 DIAGNOSIS — Z515 Encounter for palliative care: Secondary | ICD-10-CM | POA: Diagnosis not present

## 2021-06-10 DIAGNOSIS — K5901 Slow transit constipation: Secondary | ICD-10-CM | POA: Diagnosis not present

## 2021-06-10 DIAGNOSIS — E1140 Type 2 diabetes mellitus with diabetic neuropathy, unspecified: Secondary | ICD-10-CM

## 2021-06-10 DIAGNOSIS — Z794 Long term (current) use of insulin: Secondary | ICD-10-CM

## 2021-06-10 DIAGNOSIS — R350 Frequency of micturition: Secondary | ICD-10-CM

## 2021-06-10 DIAGNOSIS — R531 Weakness: Secondary | ICD-10-CM

## 2021-06-10 NOTE — Progress Notes (Signed)
[facility] Consult Note Telephone: [phone]  Fax: [phone]  PATIENT NAME: Hayley Fisher [address] [place] [address] [phone] (home)  DOB: [birth year] MRN: [id number]  PRIMARY CARE PROVIDER:    [name], MD,  [place] [address] [phone]  REFERRING PROVIDER:   Gerrit Fisher, [place],  [place] [address] [phone]  RESPONSIBLE PARTY:   Hayley Fisher Contact Information     Name Relation Home Work Hayley Fisher [phone]  [phone]   Hayley Fisher   [phone]   Hayley Fisher [phone]          I met face to face with patient and family at home. Palliative Care was asked to follow this patient by consultation request of  Hayley Heck, MD to address advance care planning, complex medical decision making and goals of care clarification. Hayley Fisher and Hayley Fisher are at home with patient during visit. This is the initial visit.    ASSESSMENT AND / RECOMMENDATIONS:   Advance Care Planning: Our advance care planning conversation included a discussion about:    The value and importance of advance care planning  Difference between Hospice and Palliative care Exploration of goals of care in the event of a sudden injury or illness  Identification and preparation of a healthcare agent  Review and updating or creation of an  advance directive document .   CODE STATUS: Ramifications and implications of code status discussed. Patient affirmed she is a Partial code - wants chest compressions, ACLS meds, no mechanical ventilation.   Goals of Care: Goals include to maximize quality of life and symptom management. MOST form given to Hayley Fisher for next visit to further explore goals of care.   I spent  46 minutes providing this initial consultation. More than 50% of the time in this consultation was  spent on counseling patient and coordinating communication. --------------------------------------------------------------------------------------------------------------------------------------  Symptom Management/Plan: Weakness: Ongoing weakness impairing mobility, now mostly in a wheelchair self propels.  PT/OT recommended for strengthening and gait training.  Type 2 DM with Neuropathy: Metformin, Levirmir. A1c 8.0 06/10/2021. Patient saw Endocrinologist yesterday. Education on diet and exercise.  No concentrated sweets. CBG ACHS. Recheck A1c in 3 months to reevaluate treatment.  Constipation: Managed with Miralax, Senna S and Colace.   Follow up: Palliative care will continue to follow for complex medical decision making, advance care planning, and clarification of goals. Return 6 weeks or prn.Encouraged to call provider sooner with any concerns.   Family /Caregiver/Community Supports: Patient lives at home  with her sister and children. Strong family support system identified.   HOSPICE ELIGIBILITY/DIAGNOSIS: TBD  Chief Complaint: Initial Palliative care visit  HISTORY OF PRESENT ILLNESS:  Hayley Fisher is a 87 y.o. year old female  with multiple medical conditions including worsening weakness, continuous, impairing patient's ADLs, requiring more help from family members than before. Patient sits mostly in her wheelchair not able to walk around as before due to ongoing weakness. She said the last time she had physical therapy it was helpful. She denied pain/discomfort. Patient with history of recurrent UTI, DVT, Depression, Urinary retention for which she has a Foley in place since June 2022, followed a Dealer.  History obtained from review of EMR, discussion with primary team, caregiver, family and/or Hayley Fisher.  Review and summarization of Epic records shows history from other than patient. Rest of 10 point ROS asked and negative.  Review of lab tests/diagnostics   Results for  Hayley Fisher (MRN [id number]) as of 06/10/2021 12:57  Ref. Range 06/09/2021 10:57  Hemoglobin A1C Latest Ref Range: 4.0 - 5.6 % 8.0 (A)  Results for Hayley Fisher (MRN [id number]) as of 06/10/2021 12:57  Ref. Range 02/24/2021 04:14  WBC Latest Ref Range: 4.0 - 10.5 K/uL 5.1  RBC Latest Ref Range: 3.87 - 5.11 MIL/uL 3.83 (L)  Hemoglobin Latest Ref Range: 12.0 - 15.0 g/dL 11.7 (L)  HCT Latest Ref Range: 36.0 - 46.0 % 36.2  MCV Latest Ref Range: 80.0 - 100.0 fL 94.5  MCH Latest Ref Range: 26.0 - 34.0 pg 30.5  MCHC Latest Ref Range: 30.0 - 36.0 g/dL 32.3  RDW Latest Ref Range: 11.5 - 15.5 % 14.9  Platelets Latest Ref Range: 150 - 400 K/uL 334  Results for [name], Fisher (MRN [id number]) as of 06/10/2021 12:57  Ref. Range 02/24/2021 04:14  Sodium Latest Ref Range: 135 - 145 mmol/L 136  Potassium Latest Ref Range: 3.5 - 5.1 mmol/L 4.5  Chloride Latest Ref Range: 98 - 111 mmol/L 97 (L)  CO2 Latest Ref Range: 22 - 32 mmol/L 31  Glucose Latest Ref Range: 70 - 99 mg/dL 384 (H)  BUN Latest Ref Range: 8 - 23 mg/dL 24 (H)  Creatinine Latest Ref Range: 0.44 - 1.00 mg/dL 0.98  Calcium Latest Ref Range: 8.9 - 10.3 mg/dL 8.2 (L)  Anion gap Latest Ref Range: 5 - 15  8  GFR, Estimated Latest Ref Range: >60 mL/min 56 (L)    ROS General: NAD EYES: denies vision changes ENMT: denies dysphagia Cardiovascular: denies chest pain/discomfort Pulmonary: denies cough, denies SOB Abdomen: endorses good appetite, denies constipation/diarrhea GU: denies dysuria, urinary frequency MSK:  endorses weakness,  no falls reported Skin: denies rashes or wounds Neurological: denies pain, denies insomnia Psych: Endorses positive mood Heme/lymph/immuno: denies bruises, abnormal bleeding  Physical Exam: Height/Weight: 5 feet 4 inches/125 Ibs Constitutional: NAD General: Well groomed, cooperative EYES: anicteric sclera, lids intact, no discharge, wears eye glasses ENMT: Moist mucous membrane CV: S1 S2, RRR, trace  edema to BLE, elevation demonstrated and encouraged. Pulmonary: LCTA, no increased work of breathing, no cough, Abdomen: active BS + 4 quadrants, soft and non tender GU: no suprapubic tenderness, Foley in place MSK: weakness, sarcopenia, limited ROM Skin: warm and dry, no rashes or wounds on visible skin Neuro:  weakness, otherwise non focal Psych: non-anxious affect Hem/lymph/immuno: no widespread bruising   PAST MEDICAL HISTORY:  Active Ambulatory Problems    Diagnosis Date Noted   Type 2 diabetes mellitus with diabetic neuropathy ([place]) 10/19/2006   Hyperlipidemia associated with type 2 diabetes mellitus ([place]) 10/19/2006   Hypertension associated with diabetes ([place]) 10/19/2006   Osteopenia 10/19/2006   Pruritus 03/17/2011   Frequent UTI 10/29/2012   DDD (degenerative disc disease), lumbosacral 11/24/2014   Constipation by delayed colonic transit 11/24/2014   Chronic venous insufficiency 11/09/2015   Spinal stenosis of lumbar region 01/13/2016   Skin ulcer of sacrum, limited to breakdown of skin ([place]) 03/16/2017   Hematuria 01/29/2018   Unstable gait 04/03/2018   Frequent falls 04/03/2018   Uncontrolled type 2 diabetes mellitus with hyperglycemia ([place]) 07/02/2020   Stage 1 decubitus ulcer 07/30/2020   Urinary frequency 09/11/2020   Gastroesophageal reflux disease without esophagitis 09/16/2020   Acute on chronic anemia 09/21/2020   Acute encephalopathy 12/09/2020   Hyperbilirubinemia 12/09/2020   Hypothyroidism 12/09/2020   Memory changes 10/20/2020   Age-related physical debility 03/22/2018   Sepsis (  [place]) 01/15/2021   AMS (altered mental status) 02/21/2021   Cognitive impairment 63/78/5885   Acute metabolic encephalopathy 02/77/4128   Malnutrition of moderate degree 02/23/2021   Resolved Ambulatory Problems    Diagnosis Date Noted   ONYCHOMYCOSIS, TOENAILS 04/03/2009   COLONIC POLYPS, HYPERPLASTIC 02/16/2007   OBESITY, NOS 10/19/2006   Diabetic neuropathy associated  with type 2 diabetes mellitus ([place]) 04/03/2009   GLAUCOMA 10/19/2006   ALOPECIA 11/02/2007   OSTEOARTHRITIS, LOWER LEG 10/19/2006   Foul smelling urine 03/17/2011   Ingrown hair 07/05/2011   Vertigo-Dizzy, vague feeling 11/16/2011   Heart palpitations 11/16/2011   Groin pain 08/06/2012   Tenesmus (rectal) 12/21/2012   Cough 01/31/2013   Sciatic pain 01/01/2014   Acute lower UTI 07/03/2014   Dark stools 11/24/2014   Piriformis syndrome of right side 01/23/2015   Family hx of colon cancer 07/23/2015   IDDM (insulin dependent diabetes mellitus) 07/23/2015   Abnormal sensation of buttocks 03/25/2016   Nausea without vomiting 03/16/2017   External hemorrhoids 03/16/2017   Vaginal mass 12/22/2017   Cerumen impaction 10/18/2018   Abnormal urogenital discharge 10/31/2019   Vaginal candidiasis 06/21/2020   Atrophic vaginitis 78/67/6720   Metabolic acidosis 94/70/9628   UTI (urinary tract infection) 07/02/2020   Type 2 diabetes mellitus with hyperosmolar hyperglycemic state (HHS) ([place]) 07/02/2020   Hyperkalemia 07/02/2020   Polyuria 09/09/2020   Bladder irritation 09/11/2020   Past Medical History:  Diagnosis Date   Arthritis    Arthritis    Cataract    Chronic kidney disease    Diabetes mellitus    Glaucoma    Hyperlipidemia    Hyperplastic colon polyp    Hypertension    Syncope    Thyroid disease     SOCIAL HX:  Social History   Tobacco Use   Smoking status: Former    Types: Cigarettes    Quit date: 11/29/1980    Years since quitting: 40.5   Smokeless tobacco: Never  Substance Use Topics   Alcohol use: No    Alcohol/week: 0.0 standard drinks     FAMILY HX:  Family History  Problem Relation Age of Onset   Colon cancer Mother    Other Father        cerebral hemorrhage   Diabetes Brother    Diabetes Sister        x 2   Bone cancer Fisher    Breast cancer Fisher 60   Esophageal cancer Neg Hx    Rectal cancer Neg Hx    Stomach cancer Neg Hx        ALLERGIES:  Allergies  Allergen Reactions   Levemir [Insulin Detemir] Itching      PERTINENT MEDICATIONS:  Outpatient Encounter Medications as of 06/10/2021  Medication Sig   Accu-Chek Softclix Lancets lancets 1 each by Other route See admin instructions. USE TO TEST BLOOD SUGAR UP  TO 3 TIMES A DAY.   aspirin 81 MG chewable tablet Chew 81 mg by mouth every morning.   atorvastatin (LIPITOR) 40 MG tablet Take 1 tablet (40 mg total) by mouth daily.   brimonidine (ALPHAGAN P) 0.1 % SOLN Place 1 drop into both eyes 2 (two) times daily.   carbamide peroxide (DEBROX) 6.5 % OTIC solution Place 2 drops into both ears 2 (two) times daily as needed (ear wax).   Catheter Self-Adhesive Urinary MISC 1 patch by Does not apply route daily.   cetirizine (ZYRTEC) 10 MG tablet Take 10 mg by mouth daily.   docusate  sodium (COLACE) 100 MG capsule Take 1 capsule (100 mg total) by mouth 2 (two) times daily.   dorzolamide-timolol (COSOPT) 22.3-6.8 MG/ML ophthalmic solution Place 1 drop into both eyes 2 (two) times daily.   DULoxetine (CYMBALTA) 60 MG capsule Take 1 capsule (60 mg total) by mouth daily.   enalapril (VASOTEC) 20 MG tablet Take 1 tablet (20 mg total) by mouth daily.   famotidine (PEPCID) 20 MG tablet Take 1 tablet (20 mg total) by mouth daily.   ferrous sulfate 325 (65 FE) MG tablet Take 325 mg by mouth daily with breakfast.   glucose blood (ACCU-CHEK AVIVA PLUS) test strip 1 each by Other route See admin instructions. USE TO TEST BLOOD SUGAR UP  TO 3 TIMES A DAY.   insulin glargine (LANTUS) 100 unit/mL SOPN Inject 10 Units into the skin daily.   Insulin Pen Needle 32G X 4 MM MISC Use as directed with insulin   Insulin Syringe-Needle U-100 (SURE COMFORT INSULIN SYRINGE) 31G X 5/16" 0.3 ML MISC Inject 1 Syringe into the skin daily.   levothyroxine (SYNTHROID) 50 MCG tablet Take 1 tablet (50 mcg total) by mouth daily.   metFORMIN (GLUCOPHAGE-XR) 500 MG 24 hr tablet Take 2 tablets (1,000 mg total)  by mouth daily with breakfast.   Misc. Devices (TRANSFER BOARD) MISC Use as needed for transfer from bed to chair or vice versa   Multiple Vitamins-Minerals (PRESERVISION AREDS 2+MULTI VIT) CAPS Take 1 capsule by mouth daily.   MYRBETRIQ 25 MG TB24 tablet TAKE 1 TABLET BY MOUTH DAILY   ondansetron (ZOFRAN ODT) 4 MG disintegrating tablet Take 1 tablet (4 mg total) by mouth every 8 (eight) hours as needed for nausea.   polyethylene glycol powder (GLYCOLAX/MIRALAX) 17 GM/SCOOP powder Take 17 g by mouth daily.   Semaglutide (RYBELSUS) 3 MG TABS Take 3 mg by mouth daily.   tamsulosin (FLOMAX) 0.4 MG CAPS capsule Take 0.4 mg by mouth daily.   VYZULTA 0.024 % SOLN Place 1 drop into both eyes at bedtime.   witch hazel-glycerin (TUCKS) pad Apply 1 application topically 2 (two) times daily as needed for itching.   No facility-administered encounter medications on file as of 06/10/2021.     Thank you for the opportunity to participate in the care of Ms. Broyhill.  The palliative care team will continue to follow. Please call our office at [phone] if we can be of additional assistance.   Note: Portions of this note were generated with [employer]. Dictation errors may occur despite best attempts at proofreading.  Teodoro Spray, NP

## 2021-06-17 DIAGNOSIS — M48061 Spinal stenosis, lumbar region without neurogenic claudication: Secondary | ICD-10-CM | POA: Diagnosis not present

## 2021-06-17 DIAGNOSIS — M6281 Muscle weakness (generalized): Secondary | ICD-10-CM | POA: Diagnosis not present

## 2021-06-24 ENCOUNTER — Telehealth: Payer: Self-pay

## 2021-06-24 NOTE — Telephone Encounter (Signed)
Received message to call daughter. Spoke with daughter who stated her mom's foley catheter bag broke but she was able to get a new one.

## 2021-06-27 ENCOUNTER — Inpatient Hospital Stay (HOSPITAL_COMMUNITY)
Admission: EM | Admit: 2021-06-27 | Discharge: 2021-07-02 | DRG: 179 | Disposition: A | Discharge status: Home / Self Care | Payer: Medicare Other | Attending: Internal Medicine | Admitting: Internal Medicine

## 2021-06-27 ENCOUNTER — Other Ambulatory Visit: Payer: Self-pay

## 2021-06-27 ENCOUNTER — Encounter (HOSPITAL_COMMUNITY): Payer: Self-pay | Admitting: Internal Medicine

## 2021-06-27 ENCOUNTER — Emergency Department (HOSPITAL_COMMUNITY): Payer: Medicare Other

## 2021-06-27 DIAGNOSIS — Z743 Need for continuous supervision: Secondary | ICD-10-CM | POA: Diagnosis not present

## 2021-06-27 DIAGNOSIS — M1990 Unspecified osteoarthritis, unspecified site: Secondary | ICD-10-CM | POA: Diagnosis present

## 2021-06-27 DIAGNOSIS — R509 Fever, unspecified: Secondary | ICD-10-CM | POA: Diagnosis not present

## 2021-06-27 DIAGNOSIS — K649 Unspecified hemorrhoids: Secondary | ICD-10-CM | POA: Diagnosis not present

## 2021-06-27 DIAGNOSIS — Z794 Long term (current) use of insulin: Secondary | ICD-10-CM

## 2021-06-27 DIAGNOSIS — D649 Anemia, unspecified: Secondary | ICD-10-CM | POA: Diagnosis present

## 2021-06-27 DIAGNOSIS — E1165 Type 2 diabetes mellitus with hyperglycemia: Secondary | ICD-10-CM | POA: Diagnosis not present

## 2021-06-27 DIAGNOSIS — N390 Urinary tract infection, site not specified: Secondary | ICD-10-CM | POA: Diagnosis not present

## 2021-06-27 DIAGNOSIS — E1140 Type 2 diabetes mellitus with diabetic neuropathy, unspecified: Secondary | ICD-10-CM | POA: Diagnosis not present

## 2021-06-27 DIAGNOSIS — Z90710 Acquired absence of both cervix and uterus: Secondary | ICD-10-CM

## 2021-06-27 DIAGNOSIS — I152 Hypertension secondary to endocrine disorders: Secondary | ICD-10-CM | POA: Diagnosis present

## 2021-06-27 DIAGNOSIS — H409 Unspecified glaucoma: Secondary | ICD-10-CM | POA: Diagnosis present

## 2021-06-27 DIAGNOSIS — R6889 Other general symptoms and signs: Secondary | ICD-10-CM | POA: Diagnosis not present

## 2021-06-27 DIAGNOSIS — E1159 Type 2 diabetes mellitus with other circulatory complications: Secondary | ICD-10-CM | POA: Diagnosis not present

## 2021-06-27 DIAGNOSIS — Z79899 Other long term (current) drug therapy: Secondary | ICD-10-CM | POA: Diagnosis not present

## 2021-06-27 DIAGNOSIS — D72819 Decreased white blood cell count, unspecified: Secondary | ICD-10-CM

## 2021-06-27 DIAGNOSIS — Z87891 Personal history of nicotine dependence: Secondary | ICD-10-CM

## 2021-06-27 DIAGNOSIS — E1169 Type 2 diabetes mellitus with other specified complication: Secondary | ICD-10-CM | POA: Diagnosis present

## 2021-06-27 DIAGNOSIS — R8271 Bacteriuria: Secondary | ICD-10-CM | POA: Diagnosis not present

## 2021-06-27 DIAGNOSIS — Z888 Allergy status to other drugs, medicaments and biological substances status: Secondary | ICD-10-CM | POA: Diagnosis not present

## 2021-06-27 DIAGNOSIS — Z7982 Long term (current) use of aspirin: Secondary | ICD-10-CM | POA: Diagnosis not present

## 2021-06-27 DIAGNOSIS — E039 Hypothyroidism, unspecified: Secondary | ICD-10-CM | POA: Diagnosis not present

## 2021-06-27 DIAGNOSIS — Z87440 Personal history of urinary (tract) infections: Secondary | ICD-10-CM | POA: Diagnosis not present

## 2021-06-27 DIAGNOSIS — Z2239 Carrier of other specified bacterial diseases: Secondary | ICD-10-CM

## 2021-06-27 DIAGNOSIS — Z7984 Long term (current) use of oral hypoglycemic drugs: Secondary | ICD-10-CM

## 2021-06-27 DIAGNOSIS — E785 Hyperlipidemia, unspecified: Secondary | ICD-10-CM | POA: Diagnosis not present

## 2021-06-27 DIAGNOSIS — U071 COVID-19: Principal | ICD-10-CM | POA: Diagnosis present

## 2021-06-27 DIAGNOSIS — Z833 Family history of diabetes mellitus: Secondary | ICD-10-CM

## 2021-06-27 DIAGNOSIS — R0902 Hypoxemia: Secondary | ICD-10-CM | POA: Diagnosis not present

## 2021-06-27 DIAGNOSIS — Z79890 Hormone replacement therapy (postmenopausal): Secondary | ICD-10-CM

## 2021-06-27 DIAGNOSIS — R0602 Shortness of breath: Secondary | ICD-10-CM | POA: Diagnosis not present

## 2021-06-27 HISTORY — DX: Decreased white blood cell count, unspecified: D72.819

## 2021-06-27 LAB — C-REACTIVE PROTEIN: CRP: 2.1 mg/dL — ABNORMAL HIGH (ref <1.0)

## 2021-06-27 LAB — URINALYSIS, ROUTINE W REFLEX MICROSCOPIC
Bilirubin Urine: NEGATIVE
Glucose, UA: NEGATIVE mg/dL
Ketones, ur: NEGATIVE mg/dL
Nitrite: POSITIVE — AB
Protein, ur: 30 mg/dL — AB
Specific Gravity, Urine: 1.01 (ref 1.005–1.030)
WBC, UA: >50 WBC/hpf — ABNORMAL HIGH (ref 0–5)
pH: 6 (ref 5.0–8.0)

## 2021-06-27 LAB — FIBRINOGEN: Fibrinogen: 307 mg/dL (ref 210–475)

## 2021-06-27 LAB — CBC WITH DIFFERENTIAL/PLATELET
Abs Immature Granulocytes: 0.01 10*3/uL (ref 0.00–0.07)
Basophils Absolute: 0 10*3/uL (ref 0.0–0.1)
Basophils Relative: 0 %
Eosinophils Absolute: 0 10*3/uL (ref 0.0–0.5)
Eosinophils Relative: 0 %
HCT: 34.7 % — ABNORMAL LOW (ref 36.0–46.0)
Hemoglobin: 11.2 g/dL — ABNORMAL LOW (ref 12.0–15.0)
Immature Granulocytes: 0 %
Lymphocytes Relative: 19 %
Lymphs Abs: 0.6 10*3/uL — ABNORMAL LOW (ref 0.7–4.0)
MCH: 31.7 pg (ref 26.0–34.0)
MCHC: 32.3 g/dL (ref 30.0–36.0)
MCV: 98.3 fL (ref 80.0–100.0)
Monocytes Absolute: 0.3 10*3/uL (ref 0.1–1.0)
Monocytes Relative: 10 %
Neutro Abs: 2.1 10*3/uL (ref 1.7–7.7)
Neutrophils Relative %: 71 %
Platelets: 183 10*3/uL (ref 150–400)
RBC: 3.53 MIL/uL — ABNORMAL LOW (ref 3.87–5.11)
RDW: 13.6 % (ref 11.5–15.5)
WBC: 3 10*3/uL — ABNORMAL LOW (ref 4.0–10.5)
nRBC: 0 % (ref 0.0–0.2)

## 2021-06-27 LAB — RESP PANEL BY RT-PCR (FLU A&B, COVID) ARPGX2
Influenza A by PCR: NEGATIVE
Influenza B by PCR: NEGATIVE
SARS Coronavirus 2 by RT PCR: POSITIVE — AB

## 2021-06-27 LAB — COMPREHENSIVE METABOLIC PANEL
ALT: 15 U/L (ref 0–44)
AST: 22 U/L (ref 15–41)
Albumin: 3.7 g/dL (ref 3.5–5.0)
Alkaline Phosphatase: 58 U/L (ref 38–126)
Anion gap: 8 (ref 5–15)
BUN: 21 mg/dL (ref 8–23)
CO2: 29 mmol/L (ref 22–32)
Calcium: 8.2 mg/dL — ABNORMAL LOW (ref 8.9–10.3)
Chloride: 98 mmol/L (ref 98–111)
Creatinine, Ser: 0.7 mg/dL (ref 0.44–1.00)
GFR, Estimated: >60 mL/min (ref >60)
Glucose, Bld: 214 mg/dL — ABNORMAL HIGH (ref 70–99)
Potassium: 4.2 mmol/L (ref 3.5–5.1)
Sodium: 135 mmol/L (ref 135–145)
Total Bilirubin: 1 mg/dL (ref 0.3–1.2)
Total Protein: 6.8 g/dL (ref 6.5–8.1)

## 2021-06-27 LAB — CBG MONITORING, ED
Glucose-Capillary: 296 mg/dL — ABNORMAL HIGH (ref 70–99)
Glucose-Capillary: 208 mg/dL — ABNORMAL HIGH (ref 70–99)

## 2021-06-27 LAB — PROCALCITONIN: Procalcitonin: 5.88 ng/mL

## 2021-06-27 LAB — LACTATE DEHYDROGENASE: LDH: 141 U/L (ref 98–192)

## 2021-06-27 LAB — D-DIMER, QUANTITATIVE: D-Dimer, Quant: 2.53 ug/mL-FEU — ABNORMAL HIGH (ref 0.00–0.50)

## 2021-06-27 LAB — LACTIC ACID, PLASMA: Lactic Acid, Venous: 1.2 mmol/L (ref 0.5–1.9)

## 2021-06-27 LAB — FERRITIN: Ferritin: 65 ng/mL (ref 11–307)

## 2021-06-27 LAB — TRIGLYCERIDES: Triglycerides: 42 mg/dL (ref <150)

## 2021-06-27 MED ORDER — SODIUM CHLORIDE 0.9 % IV SOLN
100.0000 mg | Freq: Every day | INTRAVENOUS | Status: AC
  Administered 2021-06-27 – 2021-06-30 (×4): 100 mg via INTRAVENOUS
  Filled 2021-06-27 (×4): qty 20

## 2021-06-27 MED ORDER — GUAIFENESIN-DM 100-10 MG/5ML PO SYRP
10.0000 mL | ORAL_SOLUTION | ORAL | Status: DC | PRN
  Administered 2021-07-01: 10 mL via ORAL
  Filled 2021-06-27: qty 10

## 2021-06-27 MED ORDER — ONDANSETRON HCL 4 MG PO TABS
4.0000 mg | ORAL_TABLET | Freq: Four times a day (QID) | ORAL | Status: DC | PRN
  Administered 2021-06-28: 4 mg via ORAL
  Filled 2021-06-27: qty 1

## 2021-06-27 MED ORDER — METHYLPREDNISOLONE SODIUM SUCC 40 MG IJ SOLR
40.0000 mg | Freq: Once | INTRAMUSCULAR | Status: AC
  Administered 2021-06-27: 40 mg via INTRAVENOUS
  Filled 2021-06-27: qty 1

## 2021-06-27 MED ORDER — SODIUM CHLORIDE 0.9 % IV SOLN
1000.0000 mL | INTRAVENOUS | Status: DC
  Administered 2021-06-27 – 2021-06-29 (×4): 1000 mL via INTRAVENOUS

## 2021-06-27 MED ORDER — ENOXAPARIN SODIUM 40 MG/0.4ML IJ SOSY
40.0000 mg | PREFILLED_SYRINGE | INTRAMUSCULAR | Status: DC
  Administered 2021-06-27 – 2021-07-01 (×5): 40 mg via SUBCUTANEOUS
  Filled 2021-06-27 (×5): qty 0.4

## 2021-06-27 MED ORDER — METHYLPREDNISOLONE SODIUM SUCC 40 MG IJ SOLR
27.0000 mg | Freq: Two times a day (BID) | INTRAMUSCULAR | Status: DC
Start: 2021-06-28 — End: 2021-06-30
  Administered 2021-06-28 – 2021-06-30 (×6): 27 mg via INTRAVENOUS
  Filled 2021-06-27 (×6): qty 1

## 2021-06-27 MED ORDER — INSULIN ASPART 100 UNIT/ML IJ SOLN
0.0000 [IU] | Freq: Three times a day (TID) | INTRAMUSCULAR | Status: DC
  Administered 2021-06-27 – 2021-06-28 (×3): 5 [IU] via SUBCUTANEOUS
  Administered 2021-06-28: 2 [IU] via SUBCUTANEOUS
  Administered 2021-06-29: 5 [IU] via SUBCUTANEOUS
  Administered 2021-06-29 (×2): 3 [IU] via SUBCUTANEOUS
  Administered 2021-06-30 (×2): 5 [IU] via SUBCUTANEOUS
  Filled 2021-06-27: qty 0.09

## 2021-06-27 MED ORDER — DORZOLAMIDE HCL-TIMOLOL MAL 2-0.5 % OP SOLN
1.0000 [drp] | Freq: Two times a day (BID) | OPHTHALMIC | Status: DC
  Administered 2021-06-27 – 2021-07-02 (×9): 1 [drp] via OPHTHALMIC
  Filled 2021-06-27 (×2): qty 10

## 2021-06-27 MED ORDER — ENALAPRIL MALEATE 10 MG PO TABS
20.0000 mg | ORAL_TABLET | Freq: Every day | ORAL | Status: DC
  Administered 2021-06-27 – 2021-07-02 (×6): 20 mg via ORAL
  Filled 2021-06-27 (×6): qty 2

## 2021-06-27 MED ORDER — ALBUTEROL SULFATE HFA 108 (90 BASE) MCG/ACT IN AERS
2.0000 | INHALATION_SPRAY | Freq: Four times a day (QID) | RESPIRATORY_TRACT | Status: DC
  Administered 2021-06-27 – 2021-07-01 (×16): 2 via RESPIRATORY_TRACT
  Filled 2021-06-27 (×4): qty 6.7

## 2021-06-27 MED ORDER — INSULIN GLARGINE-YFGN 100 UNIT/ML ~~LOC~~ SOLN
10.0000 [IU] | Freq: Every day | SUBCUTANEOUS | Status: DC
  Administered 2021-06-28 – 2021-06-29 (×2): 10 [IU] via SUBCUTANEOUS
  Filled 2021-06-27 (×2): qty 0.1

## 2021-06-27 MED ORDER — SODIUM CHLORIDE 0.9 % IV SOLN
100.0000 mg | Freq: Once | INTRAVENOUS | Status: AC
  Administered 2021-06-27: 100 mg via INTRAVENOUS
  Filled 2021-06-27: qty 20

## 2021-06-27 MED ORDER — ACETAMINOPHEN 325 MG PO TABS
650.0000 mg | ORAL_TABLET | Freq: Four times a day (QID) | ORAL | Status: DC | PRN
  Administered 2021-06-30: 650 mg via ORAL
  Filled 2021-06-27: qty 2

## 2021-06-27 MED ORDER — POLYETHYLENE GLYCOL 3350 17 G PO PACK
17.0000 g | PACK | Freq: Every day | ORAL | Status: DC
  Administered 2021-06-28 – 2021-07-02 (×4): 17 g via ORAL
  Filled 2021-06-27 (×5): qty 1

## 2021-06-27 MED ORDER — ONDANSETRON HCL 4 MG/2ML IJ SOLN
4.0000 mg | Freq: Four times a day (QID) | INTRAMUSCULAR | Status: DC | PRN
  Administered 2021-06-27 – 2021-06-30 (×2): 4 mg via INTRAVENOUS
  Filled 2021-06-27 (×2): qty 2

## 2021-06-27 MED ORDER — DULOXETINE HCL 60 MG PO CPEP
60.0000 mg | ORAL_CAPSULE | Freq: Every day | ORAL | Status: DC
  Administered 2021-06-27 – 2021-07-02 (×6): 60 mg via ORAL
  Filled 2021-06-27: qty 1
  Filled 2021-06-27: qty 2
  Filled 2021-06-27 (×3): qty 1
  Filled 2021-06-27: qty 2

## 2021-06-27 MED ORDER — METFORMIN HCL ER 500 MG PO TB24
1000.0000 mg | ORAL_TABLET | Freq: Every day | ORAL | Status: DC
  Administered 2021-06-28 – 2021-06-30 (×3): 1000 mg via ORAL
  Filled 2021-06-27 (×4): qty 2

## 2021-06-27 MED ORDER — ASPIRIN 81 MG PO CHEW
81.0000 mg | CHEWABLE_TABLET | Freq: Every morning | ORAL | Status: DC
  Administered 2021-06-28 – 2021-07-02 (×5): 81 mg via ORAL
  Filled 2021-06-27 (×3): qty 1

## 2021-06-27 MED ORDER — FAMOTIDINE 20 MG PO TABS
20.0000 mg | ORAL_TABLET | Freq: Every day | ORAL | Status: DC
  Administered 2021-06-27 – 2021-07-02 (×6): 20 mg via ORAL
  Filled 2021-06-27 (×6): qty 1

## 2021-06-27 MED ORDER — ATORVASTATIN CALCIUM 40 MG PO TABS
40.0000 mg | ORAL_TABLET | Freq: Every day | ORAL | Status: DC
  Administered 2021-06-27 – 2021-07-02 (×6): 40 mg via ORAL
  Filled 2021-06-27 (×6): qty 1

## 2021-06-27 MED ORDER — CEFTRIAXONE SODIUM 1 G IJ SOLR: 1.0000 g | INTRAMUSCULAR | Status: DC

## 2021-06-27 MED ORDER — ACETAMINOPHEN 325 MG PO TABS
650.0000 mg | ORAL_TABLET | Freq: Once | ORAL | Status: AC
  Administered 2021-06-27: 650 mg via ORAL
  Filled 2021-06-27: qty 2

## 2021-06-27 MED ORDER — ACETAMINOPHEN 650 MG RE SUPP: 650.0000 mg | Freq: Four times a day (QID) | RECTAL | Status: DC | PRN

## 2021-06-27 MED ORDER — LEVOTHYROXINE SODIUM 50 MCG PO TABS
50.0000 ug | ORAL_TABLET | Freq: Every day | ORAL | Status: DC
  Administered 2021-06-28 – 2021-07-01 (×4): 50 ug via ORAL
  Filled 2021-06-27 (×4): qty 1

## 2021-06-27 MED ORDER — SEMAGLUTIDE 3 MG PO TABS: 3.0000 mg | ORAL_TABLET | Freq: Every day | ORAL | Status: DC

## 2021-06-27 MED ORDER — SODIUM CHLORIDE 0.9 % IV SOLN
200.0000 mg | Freq: Once | INTRAVENOUS | Status: DC
  Filled 2021-06-27: qty 40

## 2021-06-27 MED ORDER — FERROUS SULFATE 325 (65 FE) MG PO TABS
325.0000 mg | ORAL_TABLET | Freq: Every day | ORAL | Status: DC
  Administered 2021-06-28 – 2021-07-02 (×5): 325 mg via ORAL
  Filled 2021-06-27 (×5): qty 1

## 2021-06-27 MED ORDER — SODIUM CHLORIDE 0.9 % IV SOLN
2.0000 g | INTRAVENOUS | Status: DC
  Administered 2021-06-27: 2 g via INTRAVENOUS
  Filled 2021-06-27: qty 20

## 2021-06-27 NOTE — Progress Notes (Signed)
PT demonstrated hands on understanding of Flutter device. 

## 2021-06-27 NOTE — ED Triage Notes (Signed)
Patient bib [facility], patient is covid +, cough x2 days, 92% room air. Fever today 102. Ems placed patient on 2l o2 [place], patient sat at 100%

## 2021-06-27 NOTE — H&P (Signed)
History and Physical    Hayley Fisher ZOX:[id number] DOB: 10-22-1930 DOA: 06/27/2021  PCP: [name], MD   Patient coming from: Home.  I have personally briefly reviewed patient's old medical records in [place]  Chief Complaint: Cough/COVID-positive.  HPI: Hayley Fisher is a 87 y.o. female with medical history significant of osteoarthritis, bilateral cataracts, glaucoma, unspecified chronic kidney disease, type II DM, hyperlipidemia, colon polyps, hypertension, hypothyroidism, history of metabolic acidosis, history of syncopal episode who with brought to the emergency department via EMS due to persistent cough for the past 2 days associated with decreased oxygen saturation at 92% and fever of 102 F today.  EMS placed the patient on nasal cannula oxygen.  2 LPM.  Patient stated that she does not feel too bad.  She has had 3 COVID vaccinations so far.  Denied headache, rhinorrhea but had Mild sore throat.  No dyspnea, chest pain, wheezing or hemoptysis.  No palpitations, diaphoresis, PND, orthopnea or pitting edema lower extremities.  Her appetite has been a little decreased and she had [place] Even before Getting COVID, but Denied Abdominal Pain, Nausea, Emesis, Diarrhea, Constipation, Melena or Hematochezia.  No Flank or Suprapubic Pain.  No Polyphagia, Polydipsia or Blurred Vision.  Patient Has a Foley Catheter.  ED Course: Initial vital signs were temperature 101.1 F, pulse 103, respiration 18, BP 143/80 mmHg and O2 sat 100% on room air.  Ceftriaxone was started in the emergency department.  I added Solu-Medrol 40 mg IVP x1 dose.  Lab work: Her urinalysis showed positive nitrite and leukocyte Estrace with more than 50 WBC and many bacteria.  CBC showed a white count of 3.0 with 71% neutrophils, hemoglobin 11.2 g/dL platelets 183.  D-dimer was 2.53.  Fibrinogen, lactic acid, LDH, triglycerides and ferritin were all normal.  CRP was 2.1 mg/dL.  CMP showed a glucose of 214  and calcium of 8.2 mg/dL, which corrected to about 8.5 to 8.6 mg/dL.  The rest of the CMP measurements were unremarkable.  Imaging: One-view portable chest radiograph showed no active disease.  Please see image and full radiology report for further details.  Review of Systems: As per HPI otherwise all other systems reviewed and are negative.  Past Medical History:  Diagnosis Date   Arthritis    Arthritis    Cataract    bil cateracts removed   Chronic kidney disease    "spot on one of my kidneys" per pt   Diabetes mellitus    Glaucoma    Hyperkalemia 07/02/2020   Hyperlipidemia    Hyperplastic colon polyp    Hypertension    Hypothyroidism 04/01/9146   Metabolic acidosis 82/95/6213   Syncope    Thyroid disease    Past Surgical History:  Procedure Laterality Date   ABDOMINAL HYSTERECTOMY     BLADDER SUSPENSION     bladder tacking     COLONOSCOPY     EYE SURGERY     INGUINAL HERNIA REPAIR Right    IR IVC FILTER PLMT / S&I /IMG GUID/MOD SED  01/16/2021   Social History  reports that she quit smoking about 40 years ago. Her smoking use included cigarettes. She has never used smokeless tobacco. She reports that she does not drink alcohol and does not use drugs.  Allergies  Allergen Reactions   Levemir [Insulin Detemir] Itching   Family History  Problem Relation Age of Onset   Colon cancer Mother    Other Father        cerebral hemorrhage  Diabetes Brother    Diabetes Sister        x 2   Bone cancer Daughter    Breast cancer Daughter 67   Esophageal cancer Neg Hx    Rectal cancer Neg Hx    Stomach cancer Neg Hx    Prior to Admission medications   Medication Sig Start Date End Date Taking? Authorizing Provider  Accu-Chek Softclix Lancets lancets 1 each by Other route See admin instructions. USE TO TEST BLOOD SUGAR UP  TO 3 TIMES A DAY. 02/25/21   Jennye Boroughs, MD  aspirin 81 MG chewable tablet Chew 81 mg by mouth every morning.    [provider]   atorvastatin (LIPITOR) 40 MG tablet Take 1 tablet (40 mg total) by mouth daily. 09/21/20   Mullis, Kiersten P, DO  brimonidine (ALPHAGAN P) 0.1 % SOLN Place 1 drop into both eyes 2 (two) times daily. 09/20/11     carbamide peroxide (DEBROX) 6.5 % OTIC solution Place 2 drops into both ears 2 (two) times daily as needed (ear wax). 01/20/21   Aline August, MD  Catheter Self-Adhesive Urinary MISC 1 patch by Does not apply route daily. 10/26/20   Richarda Osmond, MD  cetirizine (ZYRTEC) 10 MG tablet Take 10 mg by mouth daily.    [provider]  docusate sodium (COLACE) 100 MG capsule Take 1 capsule (100 mg total) by mouth 2 (two) times daily. 12/15/20   Harold Hedge, MD  dorzolamide-timolol (COSOPT) 22.3-6.8 MG/ML ophthalmic solution Place 1 drop into both eyes 2 (two) times daily. 05/18/20   [provider]  DULoxetine (CYMBALTA) 60 MG capsule Take 1 capsule (60 mg total) by mouth daily. 02/25/21   Jennye Boroughs, MD  enalapril (VASOTEC) 20 MG tablet Take 1 tablet (20 mg total) by mouth daily. 09/21/20   Mullis, Kiersten P, DO  famotidine (PEPCID) 20 MG tablet Take 1 tablet (20 mg total) by mouth daily. 02/25/21   Jennye Boroughs, MD  ferrous sulfate 325 (65 FE) MG tablet Take 325 mg by mouth daily with breakfast.    [provider]  glucose blood (ACCU-CHEK AVIVA PLUS) test strip 1 each by Other route See admin instructions. USE TO TEST BLOOD SUGAR UP  TO 3 TIMES A DAY. 02/25/21   Jennye Boroughs, MD  insulin glargine (LANTUS) 100 unit/mL SOPN Inject 10 Units into the skin daily. 04/15/21   Renato Shin, MD  Insulin Pen Needle 32G X 4 MM MISC Use as directed with insulin 04/12/21     Insulin Syringe-Needle U-100 (SURE COMFORT INSULIN SYRINGE) 31G X 5/16" 0.3 ML MISC Inject 1 Syringe into the skin daily. 02/25/21   Jennye Boroughs, MD  levothyroxine (SYNTHROID) 50 MCG tablet Take 1 tablet (50 mcg total) by mouth daily. 11/10/20   Renato Shin, MD  metFORMIN (GLUCOPHAGE-XR) 500 MG 24 hr  tablet Take 2 tablets (1,000 mg total) by mouth daily with breakfast. 04/06/21   Renato Shin, MD  Misc. Devices (TRANSFER BOARD) MISC Use as needed for transfer from bed to chair or vice versa 02/25/21   Jennye Boroughs, MD  Multiple Vitamins-Minerals (PRESERVISION AREDS 2+MULTI VIT) CAPS Take 1 capsule by mouth daily.    [provider]  MYRBETRIQ 25 MG TB24 tablet TAKE 1 TABLET BY MOUTH DAILY 04/01/21   [name], MD  ondansetron (ZOFRAN ODT) 4 MG disintegrating tablet Take 1 tablet (4 mg total) by mouth every 8 (eight) hours as needed for nausea. 07/28/20   Larene Pickett, PA-C  polyethylene glycol powder (GLYCOLAX/MIRALAX) 17 GM/SCOOP powder Take 17 g by mouth daily. 12/15/20   Harold Hedge, MD  Semaglutide (RYBELSUS) 3 MG TABS Take 3 mg by mouth daily. 11/09/20   Renato Shin, MD  tamsulosin (FLOMAX) 0.4 MG CAPS capsule Take 0.4 mg by mouth daily. 10/08/20   [provider]  VYZULTA 0.024 % SOLN Place 1 drop into both eyes at bedtime. 11/06/17   [provider]  witch hazel-glycerin (TUCKS) pad Apply 1 application topically 2 (two) times daily as needed for itching. 02/25/21   Jennye Boroughs, MD   Physical Exam: Vitals:   06/27/21 1330 06/27/21 1351 06/27/21 1430 06/27/21 1600  BP: 138/68  114/70 126/78  Pulse: 92  (!) 101 100  Resp: 12  16 16   Temp:  98.7 F (37.1 C)    TempSrc:  Oral    SpO2: 100%  100% 95%  Height:       Constitutional: NAD, calm, comfortable Eyes: PERRL, lids and conjunctivae normal.  Injected sclera bilaterally. ENMT: Mucous membranes are mildly dry.  Posterior pharynx clear of any exudate or lesions. Neck: normal, supple, no masses, no thyromegaly Respiratory: clear to auscultation bilaterally, no wheezing, subtle scattered crackles on lower lung field. Normal respiratory effort. No accessory muscle use.  Cardiovascular: Regular rate and rhythm, no murmurs / rubs / gallops. No extremity edema. 2+ pedal pulses. No carotid bruits.   Abdomen: No distention.  Soft, no tenderness, no masses palpated. No hepatosplenomegaly. Bowel sounds positive.  Musculoskeletal: Mild generalized weakness.  No clubbing / cyanosis. Good ROM, no contractures. Normal muscle tone.  Skin: no acute rashes, lesions, ulcers on limited dermatological examination. Neurologic: CN 2-12 grossly intact. Sensation intact, DTR normal. Strength 5/5 in all 4.  Psychiatric: Normal judgment and insight. Alert and oriented x 3. Normal mood.   Labs on Admission: I have personally reviewed following labs and imaging studies  CBC: Recent Labs  Lab 06/27/21 1145  WBC 3.0*  NEUTROABS 2.1  HGB 11.2*  HCT 34.7*  MCV 98.3  PLT 419   Basic Metabolic Panel: Recent Labs  Lab 06/27/21 1145  NA 135  K 4.2  CL 98  CO2 29  GLUCOSE 214*  BUN 21  CREATININE 0.70  CALCIUM 8.2*   GFR: CrCl cannot be calculated (Unknown ideal weight.).  Liver Function Tests: Recent Labs  Lab 06/27/21 1145  AST 22  ALT 15  ALKPHOS 58  BILITOT 1.0  PROT 6.8  ALBUMIN 3.7   Urine analysis:    Component Value Date/Time   COLORURINE YELLOW 06/27/2021 1147   APPEARANCEUR CLOUDY (A) 06/27/2021 1147   LABSPEC 1.010 06/27/2021 1147   PHURINE 6.0 06/27/2021 1147   GLUCOSEU NEGATIVE 06/27/2021 1147   HGBUR SMALL (A) 06/27/2021 1147        BILIRUBINUR NEGATIVE 06/27/2021 1147             [place] 06/27/2021 1147   PROTEINUR 30 (A) 06/27/2021 1147             NITRITE POSITIVE (A) 06/27/2021 1147   LEUKOCYTESUR LARGE (A) 06/27/2021 1147   Radiological Exams on Admission: DG Chest Port 1 View  Result Date: 06/27/2021 CLINICAL DATA:  Shortness of breath. EXAM: PORTABLE CHEST 1 VIEW COMPARISON:  February 20, 2021 FINDINGS: Cardiomediastinal silhouette is normal. Mediastinal contours appear intact. There is no evidence of focal airspace consolidation, pleural effusion or pneumothorax. Osseous structures are without acute abnormality. Soft tissues are grossly  normal. IMPRESSION: No active disease. Electronically  Signed   By: Fidela Salisbury M.D.   On: 06/27/2021 14:19    EKG: Independently reviewed.   Assessment/Plan Principal Problem:   COVID-19 virus infection Observation/telemetry. Supplemental oxygen as needed. Bronchodilators as needed. Flutter valve and I-S. Antitussives as needed Remdesivir per pharmacy. Continue low-dose Solu-Medrol. Follow-up CBC, CMP and inflammatory markers.  Active Problems:   Asymptomatic bacteriuria. Discontinue ceftriaxone 1 g IVPB. Change Foley catheter.    Leukocytopenia In the setting of viral BPs. Follow WBC in AM.    Normocytic anemia Monitor hematocrit and hemoglobin. Continue ferrous sulfate.      Uncontrolled type 2 diabetes mellitus with hyperglycemia (HCC) Carbohydrate modified diet. Continue long-acting insulin pending med rec. CBG monitoring with [place] SS.    Hyperlipidemia associated with type 2 diabetes mellitus (HCC) Continue atorvastatin 40 mg p.o. daily.    Hypertension associated with diabetes (HCC) Continue enalapril 20 mg p.o. daily.    Hypothyroidism Continue Synthroid 50 mg p.o. daily.   DVT prophylaxis: Lovenox SQ. Code Status:   Full code. Family Communication:  I spoke to her daughter [name] on the phone. Disposition Plan:   Patient is from:  Home.  Anticipated DC to:  Home.  Anticipated DC date:  06/29/2021.  Anticipated DC barriers: Clinical status. Consults called:   Admission status:  Observation/telemetry.  Severity of Illness:  Reubin Milan MD [facility]  How to contact the [facility] Attending or Consulting provider [place] or covering provider during after hours [place], for this patient?   Check the care team in [facility] and look for a) attending/consulting [facility] provider listed and b) the [facility] team listed Log into [email or web address] and use [place]'s universal password to access. If you do not have the password, please contact the hospital  operator. Locate the [facility] provider you are looking for under [facility] and page to a number that you can be directly reached. If you still have difficulty reaching the provider, please page the [facility] (Director on Call) for the Hospitalists listed on amion for assistance.  06/27/2021, 4:54 PM   This document was prepared using Dragon voice recognition software and may contain some unintended transcription errors.

## 2021-06-27 NOTE — ED Notes (Signed)
Reached out to [place] for possible report; they need to speak with the [facility] about staffing concerns for the evening and then will let me know about getting the pt a nurse assigned.

## 2021-06-27 NOTE — ED Provider Notes (Signed)
[facility] DEPT Provider Note   CSN: [id number] Arrival date & time: 06/27/21  1048     History Chief Complaint  Patient presents with   Covid Positive    Zell Madrazo is a 87 y.o. female.  87 year old female who is here due to testing positive for COVID at home.  She has had cough x2 days and has been slightly short of breath.  Pulse oximetry was 92% on room air.  Patient placed on 2 L oxygen.  Denies any vomiting or diarrhea.  No chest abdominal discomfort.  Slight weakness.  Denies any urinary symptoms.      Past Medical History:  Diagnosis Date   Arthritis    Arthritis    Cataract    bil cateracts removed   Chronic kidney disease    "spot on one of my kidneys" per pt   Diabetes mellitus    Glaucoma    Hyperkalemia 07/02/2020   Hyperlipidemia    Hyperplastic colon polyp    Hypertension    Hypothyroidism 11/28/8117   Metabolic acidosis 14/78/2956   Syncope    Thyroid disease     Patient Active Problem List   Diagnosis Date Noted   Malnutrition of moderate degree 02/23/2021   AMS (altered mental status) 02/21/2021   Cognitive impairment 21/30/8657   Acute metabolic encephalopathy 84/69/6295   Sepsis ([place]) 01/15/2021   Acute encephalopathy 12/09/2020   Hyperbilirubinemia 12/09/2020   Hypothyroidism 12/09/2020   Memory changes 10/20/2020   Acute on chronic anemia 09/21/2020   Gastroesophageal reflux disease without esophagitis 09/16/2020   Urinary frequency 09/11/2020   Stage 1 decubitus ulcer 07/30/2020   Uncontrolled type 2 diabetes mellitus with hyperglycemia ([place]) 07/02/2020   Unstable gait 04/03/2018   Frequent falls 04/03/2018   Age-related physical debility 03/22/2018   Hematuria 01/29/2018   Skin ulcer of sacrum, limited to breakdown of skin ([place]) 03/16/2017   Spinal stenosis of lumbar region 01/13/2016   Chronic venous insufficiency 11/09/2015   DDD (degenerative disc disease), lumbosacral 11/24/2014    Constipation by delayed colonic transit 11/24/2014   Frequent UTI 10/29/2012   Pruritus 03/17/2011   Type 2 diabetes mellitus with diabetic neuropathy ([place]) 10/19/2006   Hyperlipidemia associated with type 2 diabetes mellitus ([place]) 10/19/2006   Hypertension associated with diabetes ([place]) 10/19/2006   Osteopenia 10/19/2006    Past Surgical History:  Procedure Laterality Date   ABDOMINAL HYSTERECTOMY     BLADDER SUSPENSION     bladder tacking     COLONOSCOPY     EYE SURGERY     INGUINAL HERNIA REPAIR Right    IR IVC FILTER PLMT / S&I /IMG GUID/MOD SED  01/16/2021     OB History   No obstetric history on file.     Family History  Problem Relation Age of Onset   Colon cancer Mother    Other Father        cerebral hemorrhage   Diabetes Brother    Diabetes Sister        x 2   Bone cancer Daughter    Breast cancer Daughter 34   Esophageal cancer Neg Hx    Rectal cancer Neg Hx    Stomach cancer Neg Hx     Social History   Tobacco Use   Smoking status: Former    Types: Cigarettes    Quit date: 11/29/1980    Years since quitting: 40.6   Smokeless tobacco: Never  Vaping Use   Vaping Use: Never used  Substance Use Topics   Alcohol use: No    Alcohol/week: 0.0 standard drinks   Drug use: No    Home Medications Prior to Admission medications   Medication Sig Start Date End Date Taking? Authorizing Provider  Accu-Chek Softclix Lancets lancets 1 each by Other route See admin instructions. USE TO TEST BLOOD SUGAR UP  TO 3 TIMES A DAY. 02/25/21   Jennye Boroughs, MD  aspirin 81 MG chewable tablet Chew 81 mg by mouth every morning.    [provider]  atorvastatin (LIPITOR) 40 MG tablet Take 1 tablet (40 mg total) by mouth daily. 09/21/20   Mullis, Kiersten P, DO  brimonidine (ALPHAGAN P) 0.1 % SOLN Place 1 drop into both eyes 2 (two) times daily. 09/20/11     carbamide peroxide (DEBROX) 6.5 % OTIC solution Place 2 drops into both ears 2 (two) times daily as needed (ear  wax). 01/20/21   Aline August, MD  Catheter Self-Adhesive Urinary MISC 1 patch by Does not apply route daily. 10/26/20   Richarda Osmond, MD  cetirizine (ZYRTEC) 10 MG tablet Take 10 mg by mouth daily.    [provider]  docusate sodium (COLACE) 100 MG capsule Take 1 capsule (100 mg total) by mouth 2 (two) times daily. 12/15/20   Harold Hedge, MD  dorzolamide-timolol (COSOPT) 22.3-6.8 MG/ML ophthalmic solution Place 1 drop into both eyes 2 (two) times daily. 05/18/20   [provider]  DULoxetine (CYMBALTA) 60 MG capsule Take 1 capsule (60 mg total) by mouth daily. 02/25/21   Jennye Boroughs, MD  enalapril (VASOTEC) 20 MG tablet Take 1 tablet (20 mg total) by mouth daily. 09/21/20   Mullis, Kiersten P, DO  famotidine (PEPCID) 20 MG tablet Take 1 tablet (20 mg total) by mouth daily. 02/25/21   Jennye Boroughs, MD  ferrous sulfate 325 (65 FE) MG tablet Take 325 mg by mouth daily with breakfast.    [provider]  glucose blood (ACCU-CHEK AVIVA PLUS) test strip 1 each by Other route See admin instructions. USE TO TEST BLOOD SUGAR UP  TO 3 TIMES A DAY. 02/25/21   Jennye Boroughs, MD  insulin glargine (LANTUS) 100 unit/mL SOPN Inject 10 Units into the skin daily. 04/15/21   Renato Shin, MD  Insulin Pen Needle 32G X 4 MM MISC Use as directed with insulin 04/12/21     Insulin Syringe-Needle U-100 (SURE COMFORT INSULIN SYRINGE) 31G X 5/16" 0.3 ML MISC Inject 1 Syringe into the skin daily. 02/25/21   Jennye Boroughs, MD  levothyroxine (SYNTHROID) 50 MCG tablet Take 1 tablet (50 mcg total) by mouth daily. 11/10/20   Renato Shin, MD  metFORMIN (GLUCOPHAGE-XR) 500 MG 24 hr tablet Take 2 tablets (1,000 mg total) by mouth daily with breakfast. 04/06/21   Renato Shin, MD  Misc. Devices (TRANSFER BOARD) MISC Use as needed for transfer from bed to chair or vice versa 02/25/21   Jennye Boroughs, MD  Multiple Vitamins-Minerals (PRESERVISION AREDS 2+MULTI VIT) CAPS Take 1 capsule by mouth daily.     [provider]  MYRBETRIQ 25 MG TB24 tablet TAKE 1 TABLET BY MOUTH DAILY 04/01/21   [name], MD  ondansetron (ZOFRAN ODT) 4 MG disintegrating tablet Take 1 tablet (4 mg total) by mouth every 8 (eight) hours as needed for nausea. 07/28/20   Larene Pickett, PA-C  polyethylene glycol powder (GLYCOLAX/MIRALAX) 17 GM/SCOOP powder Take 17 g by mouth daily. 12/15/20   Harold Hedge, MD  Semaglutide (RYBELSUS) 3 MG  TABS Take 3 mg by mouth daily. 11/09/20   Renato Shin, MD  tamsulosin (FLOMAX) 0.4 MG CAPS capsule Take 0.4 mg by mouth daily. 10/08/20   [provider]  VYZULTA 0.024 % SOLN Place 1 drop into both eyes at bedtime. 11/06/17   [provider]  witch hazel-glycerin (TUCKS) pad Apply 1 application topically 2 (two) times daily as needed for itching. 02/25/21   Jennye Boroughs, MD    Allergies    Levemir [insulin detemir]  Review of Systems   Review of Systems  All other systems reviewed and are negative.  Physical Exam Updated Vital Signs BP (!) 143/80   Pulse (!) 103   Temp (!) 101.1 F (38.4 C) (Oral)   Resp 18   Ht 1.549 m (5\' 1" )   SpO2 100%   BMI 23.54 kg/m   Physical Exam Vitals and nursing note reviewed.  Constitutional:      General: She is not in acute distress.    Appearance: Normal appearance. She is well-developed. She is not toxic-appearing.  HENT:     Head: Normocephalic and atraumatic.  Eyes:     General: Lids are normal.     Conjunctiva/sclera: Conjunctivae normal.     Pupils: Pupils are equal, round, and reactive to light.  Neck:     Thyroid: No thyroid mass.     Trachea: No tracheal deviation.  Cardiovascular:     Rate and Rhythm: Normal rate and regular rhythm.     Heart sounds: Normal heart sounds. No murmur heard.   No gallop.  Pulmonary:     Effort: Pulmonary effort is normal. No respiratory distress.     Breath sounds: Normal breath sounds. No stridor. No decreased breath sounds, wheezing, rhonchi or rales.   Abdominal:     General: There is no distension.     Palpations: Abdomen is soft.     Tenderness: There is no abdominal tenderness. There is no rebound.  Musculoskeletal:        General: No tenderness. Normal range of motion.     Cervical back: Normal range of motion and neck supple.  Skin:    General: Skin is warm and dry.     Findings: No abrasion or rash.  Neurological:     Mental Status: She is alert and oriented to person, place, and time. Mental status is at baseline.     GCS: GCS eye subscore is 4. GCS verbal subscore is 5. GCS motor subscore is 6.     Cranial Nerves: No cranial nerve deficit.     Sensory: No sensory deficit.     Motor: Motor function is intact.  Psychiatric:        Attention and Perception: Attention normal.        Speech: Speech normal.        Behavior: Behavior normal.    ED Results / Procedures / Treatments   Labs (all labs ordered are listed, but only abnormal results are displayed) Labs Reviewed  RESP PANEL BY RT-PCR (FLU A&B, COVID) ARPGX2  CULTURE, BLOOD (ROUTINE X 2)  CULTURE, BLOOD (ROUTINE X 2)  LACTIC ACID, PLASMA  LACTIC ACID, PLASMA  CBC WITH DIFFERENTIAL/PLATELET  COMPREHENSIVE METABOLIC PANEL  D-DIMER, QUANTITATIVE  PROCALCITONIN  LACTATE DEHYDROGENASE  FERRITIN  TRIGLYCERIDES  FIBRINOGEN  C-REACTIVE PROTEIN  URINALYSIS, ROUTINE W REFLEX MICROSCOPIC    EKG None  Radiology No results found.  Procedures Procedures   Medications Ordered in ED Medications  0.9 %  sodium chloride infusion (  has no administration in time range)  acetaminophen (TYLENOL) tablet 650 mg (has no administration in time range)    ED Course  I have reviewed the triage vital signs and the nursing notes.  Pertinent labs & imaging results that were available during my care of the patient were reviewed by me and considered in my medical decision making (see chart for details).    MDM Rules/Calculators/A&P                           Patient is  COVID test is positive here.  chest x-ray without acute findings.  She does have some weakness.  Urinalysis shows infection but states she has no fever is difficult to say if this is from [place] or from her infected urine.  She does have an indwelling Foley catheter.  Patient treated with Rocephin will admit for observation Final Clinical Impression(s) / ED Diagnoses Final diagnoses:  None    Rx / DC Orders ED Discharge Orders     None        Lacretia Leigh, MD 06/27/21 1501

## 2021-06-28 DIAGNOSIS — E1165 Type 2 diabetes mellitus with hyperglycemia: Secondary | ICD-10-CM | POA: Diagnosis not present

## 2021-06-28 DIAGNOSIS — D649 Anemia, unspecified: Secondary | ICD-10-CM | POA: Diagnosis present

## 2021-06-28 DIAGNOSIS — N390 Urinary tract infection, site not specified: Secondary | ICD-10-CM | POA: Diagnosis not present

## 2021-06-28 DIAGNOSIS — Z90710 Acquired absence of both cervix and uterus: Secondary | ICD-10-CM | POA: Diagnosis not present

## 2021-06-28 DIAGNOSIS — I152 Hypertension secondary to endocrine disorders: Secondary | ICD-10-CM | POA: Diagnosis not present

## 2021-06-28 DIAGNOSIS — Z888 Allergy status to other drugs, medicaments and biological substances status: Secondary | ICD-10-CM | POA: Diagnosis not present

## 2021-06-28 DIAGNOSIS — Z79890 Hormone replacement therapy (postmenopausal): Secondary | ICD-10-CM | POA: Diagnosis not present

## 2021-06-28 DIAGNOSIS — E785 Hyperlipidemia, unspecified: Secondary | ICD-10-CM | POA: Diagnosis not present

## 2021-06-28 DIAGNOSIS — Z79899 Other long term (current) drug therapy: Secondary | ICD-10-CM | POA: Diagnosis not present

## 2021-06-28 DIAGNOSIS — E1159 Type 2 diabetes mellitus with other circulatory complications: Secondary | ICD-10-CM | POA: Diagnosis not present

## 2021-06-28 DIAGNOSIS — Z2239 Carrier of other specified bacterial diseases: Secondary | ICD-10-CM | POA: Diagnosis not present

## 2021-06-28 DIAGNOSIS — E1169 Type 2 diabetes mellitus with other specified complication: Secondary | ICD-10-CM | POA: Diagnosis not present

## 2021-06-28 DIAGNOSIS — Z87891 Personal history of nicotine dependence: Secondary | ICD-10-CM | POA: Diagnosis not present

## 2021-06-28 DIAGNOSIS — U071 COVID-19: Secondary | ICD-10-CM | POA: Diagnosis not present

## 2021-06-28 DIAGNOSIS — E039 Hypothyroidism, unspecified: Secondary | ICD-10-CM | POA: Diagnosis not present

## 2021-06-28 DIAGNOSIS — M1990 Unspecified osteoarthritis, unspecified site: Secondary | ICD-10-CM | POA: Diagnosis present

## 2021-06-28 DIAGNOSIS — E1140 Type 2 diabetes mellitus with diabetic neuropathy, unspecified: Secondary | ICD-10-CM | POA: Diagnosis not present

## 2021-06-28 DIAGNOSIS — Z87440 Personal history of urinary (tract) infections: Secondary | ICD-10-CM | POA: Diagnosis not present

## 2021-06-28 DIAGNOSIS — Z794 Long term (current) use of insulin: Secondary | ICD-10-CM | POA: Diagnosis not present

## 2021-06-28 DIAGNOSIS — K649 Unspecified hemorrhoids: Secondary | ICD-10-CM | POA: Diagnosis not present

## 2021-06-28 DIAGNOSIS — H409 Unspecified glaucoma: Secondary | ICD-10-CM | POA: Diagnosis present

## 2021-06-28 DIAGNOSIS — Z7982 Long term (current) use of aspirin: Secondary | ICD-10-CM | POA: Diagnosis not present

## 2021-06-28 DIAGNOSIS — R8271 Bacteriuria: Secondary | ICD-10-CM | POA: Diagnosis not present

## 2021-06-28 DIAGNOSIS — Z833 Family history of diabetes mellitus: Secondary | ICD-10-CM | POA: Diagnosis not present

## 2021-06-28 DIAGNOSIS — Z7984 Long term (current) use of oral hypoglycemic drugs: Secondary | ICD-10-CM | POA: Diagnosis not present

## 2021-06-28 LAB — COMPREHENSIVE METABOLIC PANEL
ALT: 15 U/L (ref 0–44)
AST: 22 U/L (ref 15–41)
Albumin: 3.4 g/dL — ABNORMAL LOW (ref 3.5–5.0)
Alkaline Phosphatase: 53 U/L (ref 38–126)
Anion gap: 8 (ref 5–15)
BUN: 22 mg/dL (ref 8–23)
CO2: 27 mmol/L (ref 22–32)
Calcium: 8.2 mg/dL — ABNORMAL LOW (ref 8.9–10.3)
Chloride: 101 mmol/L (ref 98–111)
Creatinine, Ser: 0.66 mg/dL (ref 0.44–1.00)
GFR, Estimated: >60 mL/min (ref >60)
Glucose, Bld: 304 mg/dL — ABNORMAL HIGH (ref 70–99)
Potassium: 4.5 mmol/L (ref 3.5–5.1)
Sodium: 136 mmol/L (ref 135–145)
Total Bilirubin: 0.7 mg/dL (ref 0.3–1.2)
Total Protein: 6.3 g/dL — ABNORMAL LOW (ref 6.5–8.1)

## 2021-06-28 LAB — C-REACTIVE PROTEIN: CRP: 2.6 mg/dL — ABNORMAL HIGH (ref <1.0)

## 2021-06-28 LAB — CBG MONITORING, ED
Glucose-Capillary: 261 mg/dL — ABNORMAL HIGH (ref 70–99)
Glucose-Capillary: 258 mg/dL — ABNORMAL HIGH (ref 70–99)

## 2021-06-28 LAB — CBC WITH DIFFERENTIAL/PLATELET
Abs Immature Granulocytes: 0.01 10*3/uL (ref 0.00–0.07)
Basophils Absolute: 0 10*3/uL (ref 0.0–0.1)
Basophils Relative: 0 %
Eosinophils Absolute: 0 10*3/uL (ref 0.0–0.5)
Eosinophils Relative: 0 %
HCT: 35.2 % — ABNORMAL LOW (ref 36.0–46.0)
Hemoglobin: 11.6 g/dL — ABNORMAL LOW (ref 12.0–15.0)
Immature Granulocytes: 0 %
Lymphocytes Relative: 20 %
Lymphs Abs: 0.7 10*3/uL (ref 0.7–4.0)
MCH: 31.8 pg (ref 26.0–34.0)
MCHC: 33 g/dL (ref 30.0–36.0)
MCV: 96.4 fL (ref 80.0–100.0)
Monocytes Absolute: 0.2 10*3/uL (ref 0.1–1.0)
Monocytes Relative: 7 %
Neutro Abs: 2.4 10*3/uL (ref 1.7–7.7)
Neutrophils Relative %: 73 %
Platelets: 175 10*3/uL (ref 150–400)
RBC: 3.65 MIL/uL — ABNORMAL LOW (ref 3.87–5.11)
RDW: 13.2 % (ref 11.5–15.5)
WBC: 3.3 10*3/uL — ABNORMAL LOW (ref 4.0–10.5)
nRBC: 0 % (ref 0.0–0.2)

## 2021-06-28 LAB — PHOSPHORUS: Phosphorus: 3.8 mg/dL (ref 2.5–4.6)

## 2021-06-28 LAB — GLUCOSE, CAPILLARY
Glucose-Capillary: 197 mg/dL — ABNORMAL HIGH (ref 70–99)
Glucose-Capillary: 167 mg/dL — ABNORMAL HIGH (ref 70–99)

## 2021-06-28 LAB — FERRITIN: Ferritin: 74 ng/mL (ref 11–307)

## 2021-06-28 LAB — D-DIMER, QUANTITATIVE: D-Dimer, Quant: 2 ug/mL-FEU — ABNORMAL HIGH (ref 0.00–0.50)

## 2021-06-28 LAB — MAGNESIUM: Magnesium: 1.8 mg/dL (ref 1.7–2.4)

## 2021-06-28 LAB — PROCALCITONIN: Procalcitonin: <0.1 ng/mL

## 2021-06-28 MED ORDER — SODIUM CHLORIDE 0.9 % IV SOLN: 500.0000 mg | INTRAVENOUS | Status: DC

## 2021-06-28 MED ORDER — SODIUM CHLORIDE 0.9 % IV SOLN: 1.0000 g | INTRAVENOUS | Status: DC

## 2021-06-28 MED ORDER — SODIUM CHLORIDE 0.9 % IV SOLN
1.0000 g | INTRAVENOUS | Status: DC
  Administered 2021-06-28: 1 g via INTRAVENOUS
  Filled 2021-06-28 (×2): qty 10

## 2021-06-28 MED ORDER — CHLORHEXIDINE GLUCONATE CLOTH 2 % EX PADS
6.0000 | MEDICATED_PAD | Freq: Every day | CUTANEOUS | Status: DC
  Administered 2021-06-28 – 2021-07-02 (×5): 6 via TOPICAL

## 2021-06-28 NOTE — ED Notes (Signed)
Call received from pt daughter [name] [phone] requesting rtn call for pt status/updates. [employer]

## 2021-06-28 NOTE — ED Notes (Signed)
RN called and spoke with daughter, gave her an update on the patient.

## 2021-06-28 NOTE — ED Notes (Signed)
Patient spoke to her daughter on the phone.

## 2021-06-28 NOTE — Plan of Care (Signed)
  Problem: Education: Goal: Knowledge of General Education information will improve Description: Including pain rating scale, medication(s)/side effects and non-pharmacologic comfort measures Outcome: Progressing   Problem: Clinical Measurements: Goal: Ability to maintain clinical measurements within normal limits will improve Outcome: Progressing Goal: Will remain free from infection Outcome: Progressing Goal: Diagnostic test results will improve Outcome: Progressing Goal: Respiratory complications will improve Outcome: Progressing Goal: Cardiovascular complication will be avoided Outcome: Progressing   Problem: Activity: Goal: Risk for activity intolerance will decrease Outcome: Progressing   Problem: Nutrition: Goal: Adequate nutrition will be maintained Outcome: Progressing   Problem: Coping: Goal: Level of anxiety will decrease Outcome: Progressing   Problem: Elimination: Goal: Will not experience complications related to bowel motility Outcome: Progressing Goal: Will not experience complications related to urinary retention Outcome: Progressing   Problem: Pain Managment: Goal: General experience of comfort will improve Outcome: Progressing   Problem: Safety: Goal: Ability to remain free from injury will improve Outcome: Progressing   Problem: Skin Integrity: Goal: Risk for impaired skin integrity will decrease Outcome: Progressing   Problem: Education: Goal: Knowledge of risk factors and measures for prevention of condition will improve Outcome: Progressing   Problem: Coping: Goal: Psychosocial and spiritual needs will be supported Outcome: Progressing   Problem: Respiratory: Goal: Will maintain a patent airway Outcome: Progressing Goal: Complications related to the disease process, condition or treatment will be avoided or minimized Outcome: Progressing

## 2021-06-28 NOTE — Progress Notes (Signed)
PROGRESS NOTE    Hayley Fisher  GEX:[id number] DOB: 19-May-1931 DOA: 06/27/2021 PCP: [name], MD   Brief Narrative: 87 year old female admitted with cough fever found to have COVID.  She was febrile 101.1 and tachycardic with a heart rate of 103 on admission.  She has a chronic indwelling Foley catheter with history of recurrent UTIs.  She lives at home with her daughter.  Daughter reported that she was very confused disoriented and delirious. Urine culture positive for E. coli and Klebsiella.  She received 1 dose of Rocephin in the ER.  Her procalcitonin was high on admission and is trending down to normal.  Assessment & Plan:   Principal Problem:   COVID-19 virus infection Active Problems:   Type 2 diabetes mellitus with diabetic neuropathy (HCC)   Hyperlipidemia associated with type 2 diabetes mellitus ([place])   Hypertension associated with diabetes ([place])   Uncontrolled type 2 diabetes mellitus with hyperglycemia (HCC)   Hypothyroidism   Normocytic anemia   Leukocytopenia   Asymptomatic bacteriuria   COVID-19 virus infection patient admitted with fever tachypnea tachycardia.  Chest x-ray negative for acute findings.  She has cough and shortness of breath.  Continue and finish the course of remdesivir and IV steroids. Continue supportive treatment and nebulizers. Patient was febrile and tachycardic on admission.  She did not have any hypoxia.  E. coli Klebsiella UTI in the setting of chronic indwelling Foley catheter continue Rocephin  Normocytic anemia/leukopenia monitor  Uncontrolled type 2 diabetes continue insulin last A1c was 8.0 in October 2022  Hyper lipidemia on atorvastatin  Hypothyroidism continue Synthroid  Hypertension on enalapril 20 mg daily    Estimated body mass index is 23.54 kg/m as calculated from the following:   Height as of this encounter: 5\' 1"  (1.549 m).   Weight as of this encounter: 56.5 kg.  DVT prophylaxis: Lovenox  code Status:  Full code Family Communication: Discussed with daughter [name] over the phone  disposition Plan:  Status is: Inpatient The patient will require care spanning > 2 midnights and should be moved to inpatient because: COVID fever cough tachycardia UTI   Consultants:  None  Procedures: None Antimicrobials: Rocephin  Subjective: Patient is resting in bed seen her in the ED room She feels her breathing is better  Objective: Vitals:   06/28/21 0830 06/28/21 0930 06/28/21 1130 06/28/21 1400  BP: (!) 173/94 (!) 156/86 (!) 156/82 (!) 155/92  Pulse: 86 86 83 78  Resp: 15 12 14 18   Temp:      TempSrc:      SpO2: 100% 100% 97% 98%  Weight:      Height:        Intake/Output Summary (Last 24 hours) at 06/28/2021 1511 Last data filed at 06/28/2021 1040 Gross per 24 hour  Intake 200 ml  Output 1001 ml  Net -801 ml   Filed Weights   06/27/21 1701  Weight: 56.5 kg    Examination:  General exam: Appears calm and comfortable  Respiratory system: Rhonchi and wheezing to auscultation. Respiratory effort normal. Cardiovascular system: S1 & S2 heard, RRR. No JVD, murmurs, rubs, gallops or clicks. No pedal edema. Gastrointestinal system: Abdomen is nondistended, soft and nontender. No organomegaly or masses felt. Normal bowel sounds heard. Central nervous system: Alert and oriented. No focal neurological deficits. Extremities: No edema  skin: No rashes, lesions or ulcers Psychiatry: Judgement and insight appear normal. Mood & affect appropriate.     Data Reviewed: I have personally reviewed following labs and  imaging studies  CBC: Recent Labs  Lab 06/27/21 1145 06/28/21 0500  WBC 3.0* 3.3*  NEUTROABS 2.1 2.4  HGB 11.2* 11.6*  HCT 34.7* 35.2*  MCV 98.3 96.4  PLT 183 671   Basic Metabolic Panel: Recent Labs  Lab 06/27/21 1145 06/28/21 0500  NA 135 136  K 4.2 4.5  CL 98 101  CO2 29 27  GLUCOSE 214* 304*  BUN 21 22  CREATININE 0.70 0.66  CALCIUM 8.2* 8.2*  MG  --  1.8   PHOS  --  3.8   GFR: Estimated Creatinine Clearance: 36.7 mL/min (by C-G formula based on SCr of 0.66 mg/dL). Liver Function Tests: Recent Labs  Lab 06/27/21 1145 06/28/21 0500  AST 22 22  ALT 15 15  ALKPHOS 58 53  BILITOT 1.0 0.7  PROT 6.8 6.3*  ALBUMIN 3.7 3.4*   No results for input(s): LIPASE, AMYLASE in the last 168 hours. No results for input(s): AMMONIA in the last 168 hours. Coagulation Profile: No results for input(s): INR, PROTIME in the last 168 hours. Cardiac Enzymes: No results for input(s): CKTOTAL, CKMB, CKMBINDEX, TROPONINI in the last 168 hours. BNP (last 3 results) No results for input(s): PROBNP in the last 8760 hours. HbA1C: No results for input(s): HGBA1C in the last 72 hours. CBG: Recent Labs  Lab 06/27/21 1710 06/27/21 2213 06/28/21 0805 06/28/21 1234  GLUCAP 296* 208* 261* 258*   Lipid Profile: Recent Labs    06/27/21 1145  TRIG 42   Thyroid Function Tests: No results for input(s): TSH, T4TOTAL, FREET4, T3FREE, THYROIDAB in the last 72 hours. Anemia Panel: Recent Labs    06/27/21 1145 06/28/21 0500  FERRITIN 65 74   Sepsis Labs: Recent Labs  Lab 06/27/21 1145 06/27/21 1230 06/28/21 0500  PROCALCITON 5.88  --  <0.10  LATICACIDVEN  --  1.2  --     Recent Results (from the past 240 hour(s))  Resp Panel by RT-PCR (Flu A&B, Covid) Nasopharyngeal Swab     Status: Abnormal   Collection Time: 06/27/21 11:45 AM   Specimen: Nasopharyngeal Swab; Nasopharyngeal(NP) swabs in vial transport medium  Result Value Ref Range Status   SARS Coronavirus 2 by RT PCR POSITIVE (A) NEGATIVE Final    Comment: RESULT CALLED TO, READ BACK BY AND VERIFIED WITH: Trinna Balloon. RN ON 06/27/2021 @ 14247 BY MECIAL J. (NOTE) SARS-CoV-2 target nucleic acids are DETECTED.  The SARS-CoV-2 RNA is generally detectable in upper respiratory specimens during the acute phase of infection. Positive results are indicative of the presence of the identified virus, but  do not rule out bacterial infection or co-infection with other pathogens not detected by the test. Clinical correlation with patient history and other diagnostic information is necessary to determine patient infection status. The expected result is Negative.  Fact Sheet for Patients: EntrepreneurPulse.com.au  Fact Sheet for Healthcare Providers: IncredibleEmployment.be  This test is not yet approved or cleared by the [place] FDA and  has been authorized for detection and/or diagnosis of SARS-CoV-2 by FDA under an Emergency Use Authorization (EUA).  This EUA will remain in effect (meaning t his test can be used) for the duration of  the COVID-19 declaration under Section 564(b)(1) of the Act, 21 U.S.C. section 360bbb-3(b)(1), unless the authorization is terminated or revoked sooner.     Influenza A by PCR NEGATIVE NEGATIVE Final   Influenza B by PCR NEGATIVE NEGATIVE Final    Comment: (NOTE) The Xpert Xpress SARS-CoV-2/FLU/RSV plus assay is intended as an aid  in the diagnosis of influenza from Nasopharyngeal swab specimens and should not be used as a sole basis for treatment. Nasal washings and aspirates are unacceptable for Xpert Xpress SARS-CoV-2/FLU/RSV testing.  Fact Sheet for Patients: EntrepreneurPulse.com.au  Fact Sheet for Healthcare Providers: IncredibleEmployment.be  This test is not yet approved or cleared by the [place] FDA and has been authorized for detection and/or diagnosis of SARS-CoV-2 by FDA under an Emergency Use Authorization (EUA). This EUA will remain in effect (meaning this test can be used) for the duration of the COVID-19 declaration under Section 564(b)(1) of the Act, 21 U.S.C. section 360bbb-3(b)(1), unless the authorization is terminated or revoked.  Performed at [facility], [place] [address], [place], [place] [address]   Blood Culture (routine  x 2)     Status: None (Preliminary result)   Collection Time: 06/27/21 11:45 AM   Specimen: BLOOD  Result Value Ref Range Status   Specimen Description   Final    BLOOD BLOOD LEFT FOREARM Performed at [facility] [address] [place], [place] [address]    Special Requests   Final    BOTTLES DRAWN AEROBIC AND ANAEROBIC Blood Culture adequate volume Performed at [facility] [address], [place], [place] [address]    Culture   Final    NO GROWTH < 24 HOURS Performed at [facility] [address] [place], [place] [address]    Report Status PENDING  Incomplete  Blood Culture (routine x 2)     Status: None (Preliminary result)   Collection Time: 06/27/21 11:47 AM   Specimen: BLOOD  Result Value Ref Range Status   Specimen Description   Final    BLOOD LEFT ANTECUBITAL Performed at [facility] [address], [place], [place] [address]    Special Requests   Final    BOTTLES DRAWN AEROBIC AND ANAEROBIC Blood Culture adequate volume Performed at [facility] [address], [place], [place] [address]    Culture   Final    NO GROWTH < 24 HOURS Performed at [facility] [address], [place], [place] [address]    Report Status PENDING  Incomplete  Urine Culture     Status: Abnormal (Preliminary result)   Collection Time: 06/27/21 11:47 AM   Specimen: Urine, Clean Catch  Result Value Ref Range Status   Specimen Description   Final    URINE, CLEAN CATCH Performed at [facility], [place] [address], [place], [place] [address]    Special Requests   Final    NONE Performed at [facility], [place] [address] [place], [place] [address]    Culture (A)  Final    >=100,000 COLONIES/mL ESCHERICHIA COLI >=100,000 COLONIES/mL KLEBSIELLA PNEUMONIAE SUSCEPTIBILITIES TO FOLLOW Performed at [facility] [address] [place],  [address]     Report Status PENDING  Incomplete         Radiology Studies: DG Chest Port 1 View  Result Date: 06/27/2021 CLINICAL DATA:  Shortness of breath. EXAM: PORTABLE CHEST 1 VIEW COMPARISON:  February 20, 2021 FINDINGS: Cardiomediastinal silhouette is normal. Mediastinal contours appear intact. There is no evidence of focal airspace consolidation, pleural effusion or pneumothorax. Osseous structures are without acute abnormality. Soft tissues are grossly normal. IMPRESSION: No active disease. Electronically Signed   By: Fidela Salisbury M.D.   On: 06/27/2021 14:19        Scheduled Meds:  albuterol  2 puff Inhalation Q6H   aspirin  81 mg Oral q morning   atorvastatin  40 mg Oral Daily   dorzolamide-timolol  1 drop Both Eyes BID   DULoxetine  60 mg Oral Daily   enalapril  20 mg Oral Daily   enoxaparin (LOVENOX) injection  40 mg Subcutaneous Q24H   famotidine  20 mg Oral Daily   ferrous sulfate  325 mg Oral Q breakfast   insulin aspart  0-9 Units Subcutaneous TID WC   insulin glargine-yfgn  10 Units Subcutaneous Daily   levothyroxine  50 mcg Oral Q0600   metFORMIN  1,000 mg Oral Q breakfast   methylPREDNISolone (SOLU-MEDROL) injection  27 mg Intravenous Q12H   polyethylene glycol  17 g Oral Daily   Semaglutide  3 mg Oral Daily   Continuous Infusions:  sodium chloride 100 mL/hr at 06/28/21 0605   remdesivir 200 mg in sodium chloride 0.9% 250 mL IVPB     Followed by   remdesivir 100 mg in NS 100 mL Stopped (06/28/21 1040)     LOS: 0 days     Georgette Shell, MD 06/28/2021, 3:11 PM

## 2021-06-29 ENCOUNTER — Telehealth: Payer: Medicare Other

## 2021-06-29 LAB — COMPREHENSIVE METABOLIC PANEL
ALT: 14 U/L (ref 0–44)
AST: 20 U/L (ref 15–41)
Albumin: 3.1 g/dL — ABNORMAL LOW (ref 3.5–5.0)
Alkaline Phosphatase: 45 U/L (ref 38–126)
Anion gap: 8 (ref 5–15)
BUN: 25 mg/dL — ABNORMAL HIGH (ref 8–23)
CO2: 26 mmol/L (ref 22–32)
Calcium: 8 mg/dL — ABNORMAL LOW (ref 8.9–10.3)
Chloride: 98 mmol/L (ref 98–111)
Creatinine, Ser: 0.68 mg/dL (ref 0.44–1.00)
GFR, Estimated: >60 mL/min (ref >60)
Glucose, Bld: 244 mg/dL — ABNORMAL HIGH (ref 70–99)
Potassium: 4.4 mmol/L (ref 3.5–5.1)
Sodium: 132 mmol/L — ABNORMAL LOW (ref 135–145)
Total Bilirubin: 0.4 mg/dL (ref 0.3–1.2)
Total Protein: 6 g/dL — ABNORMAL LOW (ref 6.5–8.1)

## 2021-06-29 LAB — CBC WITH DIFFERENTIAL/PLATELET
Abs Immature Granulocytes: 0.02 10*3/uL (ref 0.00–0.07)
Basophils Absolute: 0 10*3/uL (ref 0.0–0.1)
Basophils Relative: 0 %
Eosinophils Absolute: 0 10*3/uL (ref 0.0–0.5)
Eosinophils Relative: 0 %
HCT: 36.2 % (ref 36.0–46.0)
Hemoglobin: 11.8 g/dL — ABNORMAL LOW (ref 12.0–15.0)
Immature Granulocytes: 0 %
Lymphocytes Relative: 16 %
Lymphs Abs: 0.7 10*3/uL (ref 0.7–4.0)
MCH: 31.2 pg (ref 26.0–34.0)
MCHC: 32.6 g/dL (ref 30.0–36.0)
MCV: 95.8 fL (ref 80.0–100.0)
Monocytes Absolute: 0.2 10*3/uL (ref 0.1–1.0)
Monocytes Relative: 5 %
Neutro Abs: 3.6 10*3/uL (ref 1.7–7.7)
Neutrophils Relative %: 79 %
Platelets: 205 10*3/uL (ref 150–400)
RBC: 3.78 MIL/uL — ABNORMAL LOW (ref 3.87–5.11)
RDW: 13.2 % (ref 11.5–15.5)
WBC: 4.5 10*3/uL (ref 4.0–10.5)
nRBC: 0 % (ref 0.0–0.2)

## 2021-06-29 LAB — C-REACTIVE PROTEIN: CRP: 1.6 mg/dL — ABNORMAL HIGH (ref <1.0)

## 2021-06-29 LAB — URINE CULTURE: Culture: >=100000 — AB

## 2021-06-29 LAB — MAGNESIUM: Magnesium: 1.6 mg/dL — ABNORMAL LOW (ref 1.7–2.4)

## 2021-06-29 LAB — GLUCOSE, CAPILLARY
Glucose-Capillary: 224 mg/dL — ABNORMAL HIGH (ref 70–99)
Glucose-Capillary: 242 mg/dL — ABNORMAL HIGH (ref 70–99)
Glucose-Capillary: 271 mg/dL — ABNORMAL HIGH (ref 70–99)
Glucose-Capillary: 215 mg/dL — ABNORMAL HIGH (ref 70–99)

## 2021-06-29 LAB — PHOSPHORUS: Phosphorus: 3.8 mg/dL (ref 2.5–4.6)

## 2021-06-29 LAB — FERRITIN: Ferritin: 85 ng/mL (ref 11–307)

## 2021-06-29 LAB — D-DIMER, QUANTITATIVE: D-Dimer, Quant: 1.92 ug/mL-FEU — ABNORMAL HIGH (ref 0.00–0.50)

## 2021-06-29 MED ORDER — INSULIN GLARGINE-YFGN 100 UNIT/ML ~~LOC~~ SOLN
15.0000 [IU] | Freq: Every day | SUBCUTANEOUS | Status: DC
Start: 2021-06-30 — End: 2021-06-30
  Administered 2021-06-30: 15 [IU] via SUBCUTANEOUS
  Filled 2021-06-29: qty 0.15

## 2021-06-29 NOTE — Plan of Care (Signed)

## 2021-06-29 NOTE — Evaluation (Signed)
Physical Therapy Evaluation Patient Details Name: Hayley Fisher MRN: [id number] DOB: January 20, 1931 Today's Date: 06/29/2021  History of Present Illness  Pt is 87 yo female admitted on 06/27/21 with COVID and UTI.  She has hx of osteoarthritis, bilateral cataracts, glaucoma, unspecified chronic kidney disease, type II DM, hyperlipidemia, colon polyps, hypertension, hypothyroidism, history of metabolic acidosis, history of syncopal episode  Clinical Impression  Pt admitted with above diagnosis.  She resides at home with family where she has near 24 hr care.  Pt required assist with all ADLs, non-ambulatory, and requiring at least mod A for stand pivot at baseline.  Today, pt requiring mod A to stand and all VSS.  She appears to be near her baseline.  Pt would not benefit from acute PT services as at baseline.  Continue to recommend OOB with nursing assist for pivots.  She has necessary DME and support at home.        Recommendations for follow up therapy are one component of a multi-disciplinary discharge planning process, led by the attending physician.  Recommendations may be updated based on patient status, additional functional criteria and insurance authorization.  Follow Up Recommendations No PT follow up    Assistance Recommended at Discharge Frequent or constant Supervision/Assistance  Functional Status Assessment Patient has not had a recent decline in their functional status  Equipment Recommendations       Recommendations for Other Services       Precautions / Restrictions Precautions Precautions: Fall      Mobility  Bed Mobility Overal bed mobility: Needs Assistance             General bed mobility comments: in chair at arrival    Transfers Overall transfer level: Needs assistance   Transfers: Sit to/from Stand Sit to Stand: Mod assist           General transfer comment: MOd A to stand with pt holding therapist shoulders/neck.  Did not pivot as pt already in  chair and lunch coming.  Also, pt's feet sliding - reports she normally does in shoes    Ambulation/Gait               General Gait Details: non-ambulatory  Stairs            Wheelchair Mobility    Modified Rankin (Stroke Patients Only)       Balance Overall balance assessment: Needs assistance Sitting-balance support: No upper extremity supported Sitting balance-Leahy Scale: Fair     Standing balance support: Bilateral upper extremity supported Standing balance-Leahy Scale: Zero                               Pertinent Vitals/Pain Pain Assessment: 0-10 Pain Score: 5  Pain Location: buttock (reports hemorroids) Pain Descriptors / Indicators: Discomfort Pain Intervention(s): Limited activity within patient's tolerance;Monitored during session    Home Living Family/patient expects to be discharged to:: Private residence Living Arrangements: Children (daughter works from home) Available Help at Discharge: Family;Available 24 hours/day Type of Home: House Home Access: Ramped entrance       Home Layout: One level Home Equipment: Wheelchair - manual;BSC/3in1;Hospital bed      Prior Function               Mobility Comments: Uses manual w/c and family pushes.   REports she holds onto daughter neck/shoulder for pivot to chairs - with daughter providing at least mod A ADLs Comments: has assist with  all ADLs; does sponge baths     Hand Dominance        Extremity/Trunk Assessment   Upper Extremity Assessment Upper Extremity Assessment:  (ROM: WFL except bil fingers stay in flexed position (but not cutting into palm); MMT 4/5)    Lower Extremity Assessment Lower Extremity Assessment:  (ROM WFL; MMT 5/5)    Cervical / Trunk Assessment Cervical / Trunk Assessment: Kyphotic  Communication   Communication: No difficulties  Cognition Arousal/Alertness: Awake/alert Behavior During Therapy: WFL for tasks assessed/performed Overall  Cognitive Status: Within Functional Limits for tasks assessed                                          General Comments General comments (skin integrity, edema, etc.): VSS    Exercises     Assessment/Plan    PT Assessment Patient does not need any further PT services  PT Problem List         PT Treatment Interventions      PT Goals (Current goals can be found in the Care Plan section)  Acute Rehab PT Goals Patient Stated Goal: return home    Frequency     Barriers to discharge        Co-evaluation               AM-PAC PT "6 Clicks" Mobility  Outcome Measure Help needed turning from your back to your side while in a flat bed without using bedrails?: A Lot Help needed moving from lying on your back to sitting on the side of a flat bed without using bedrails?: A Lot Help needed moving to and from a bed to a chair (including a wheelchair)?: A Lot Help needed standing up from a chair using your arms (e.g., wheelchair or bedside chair)?: A Lot Help needed to walk in hospital room?: Total Help needed climbing 3-5 steps with a railing? : Total 6 Click Score: 10    End of Session Equipment Utilized During Treatment: Gait belt Activity Tolerance: Patient tolerated treatment well Patient left: in chair;with call bell/phone within reach Nurse Communication: Mobility status      Time: 4431-5400 PT Time Calculation (min) (ACUTE ONLY): 21 min   Charges:   PT Evaluation $PT Eval Low Complexity: 1 Low          Jemina Scahill, PT Acute Rehab Services Pager [phone] [facility] [phone]   Karlton Lemon 06/29/2021, 12:52 PM

## 2021-06-29 NOTE — Progress Notes (Signed)
PROGRESS NOTE    Hayley Fisher  CXK:[id number] DOB: [birth year] DOA: 06/27/2021 PCP: [name], MD   Brief Narrative: 87 year old female admitted with cough fever found to have COVID.  She was febrile 101.1 and tachycardic with a heart rate of 103 on admission.  She has a chronic indwelling Foley catheter with history of recurrent UTIs.  She lives at home with her daughter.  Daughter reported that she was very confused disoriented and delirious. Urine culture positive for E. coli and Klebsiella.  She received 1 dose of Rocephin in the ER.  Her procalcitonin was high on admission and is trending down to normal.  Assessment & Plan:   Principal Problem:   COVID-19 virus infection Active Problems:   Type 2 diabetes mellitus with diabetic neuropathy (HCC)   Hyperlipidemia associated with type 2 diabetes mellitus ([place])   Hypertension associated with diabetes ([place])   Uncontrolled type 2 diabetes mellitus with hyperglycemia (HCC)   Hypothyroidism   Normocytic anemia   Leukocytopenia   Asymptomatic bacteriuria   COVID-19 virus infection patient admitted with fever tachypnea tachycardia.  Chest x-ray negative for acute findings.  She has cough and shortness of breath.   Continue and finish the course of remdesivir and IV steroids. Continue supportive treatment and nebulizers. Patient was febrile and tachycardic on admission.  She did not have any hypoxia.  Urine culture with ESBL-likely colonization.ID recommends not to treat as she was asymptomatic.(The symptoms she had which was fever and confusion has resolved without any treatment for UTI)  Normocytic anemia/leukopenia stable and resolved  Uncontrolled type 2 diabetes continue insulin last A1c was 8.0 in October 2022  Hyper lipidemia on atorvastatin  Hypothyroidism continue Synthroid  Hypertension on enalapril 20 mg daily.  Blood pressure stable.    Estimated body mass index is 24.35 kg/m as calculated from the  following:   Height as of this encounter: 5\' 2"  (1.575 m).   Weight as of this encounter: 60.4 kg.  DVT prophylaxis: Lovenox  code Status: Full code Family Communication: Discussed with daughter [name] over the phone on 06/28/2021.  She should be ready for discharge tomorrow 06/30/2021.  Patient wants to go home daughter wants to take the patient home and not go to any rehab. disposition Plan:  Status is: Inpatient The patient will require care spanning > 2 midnights and should be moved to inpatient because: COVID fever cough tachycardia UTI   Consultants:  None  Procedures: None Antimicrobials: Rocephin  Subjective: She is resting in bed in no acute distress she feels her breathing is better denies any nausea vomiting abdominal pain or diarrhea or urinary symptoms. Objective: Vitals:   06/28/21 2059 06/29/21 0028 06/29/21 0344 06/29/21 1104  BP: 135/77 (!) 145/84 (!) 143/78 139/73  Pulse: 87 (!) 43 82 87  Resp: 18 16 16 16   Temp: 98.7 F (37.1 C) 98.6 F (37 C) 98.1 F (36.7 C) 98.2 F (36.8 C)  TempSrc: Oral Oral Oral Oral  SpO2: 99% 98% 97% 96%  Weight:      Height:        Intake/Output Summary (Last 24 hours) at 06/29/2021 1517 Last data filed at 06/29/2021 1507 Gross per 24 hour  Intake 2548.69 ml  Output 800 ml  Net 1748.69 ml    Filed Weights   06/27/21 1701 06/28/21 1648  Weight: 56.5 kg 60.4 kg    Examination: Foley in place General exam: Appears calm and comfortable  Respiratory system: Rhonchi and wheezing to auscultation. Respiratory effort normal. Cardiovascular  system: S1 & S2 heard, RRR. No JVD, murmurs, rubs, gallops or clicks. No pedal edema. Gastrointestinal system: Abdomen is nondistended, soft and nontender. No organomegaly or masses felt. Normal bowel sounds heard. Central nervous system: Alert and oriented. No focal neurological deficits. Extremities: No edema  skin: No rashes, lesions or ulcers Psychiatry: Judgement and insight appear  normal. Mood & affect appropriate.     Data Reviewed: I have personally reviewed following labs and imaging studies  CBC: Recent Labs  Lab 06/27/21 1145 06/28/21 0500 06/29/21 0353  WBC 3.0* 3.3* 4.5  NEUTROABS 2.1 2.4 3.6  HGB 11.2* 11.6* 11.8*  HCT 34.7* 35.2* 36.2  MCV 98.3 96.4 95.8  PLT 183 175 696    Basic Metabolic Panel: Recent Labs  Lab 06/27/21 1145 06/28/21 0500 06/29/21 0353  NA 135 136 132*  K 4.2 4.5 4.4  CL 98 101 98  CO2 29 27 26   GLUCOSE 214* 304* 244*  BUN 21 22 25*  CREATININE 0.70 0.66 0.68  CALCIUM 8.2* 8.2* 8.0*  MG  --  1.8 1.6*  PHOS  --  3.8 3.8    GFR: Estimated Creatinine Clearance: 41.6 mL/min (by C-G formula based on SCr of 0.68 mg/dL). Liver Function Tests: Recent Labs  Lab 06/27/21 1145 06/28/21 0500 06/29/21 0353  AST 22 22 20   ALT 15 15 14   ALKPHOS 58 53 45  BILITOT 1.0 0.7 0.4  PROT 6.8 6.3* 6.0*  ALBUMIN 3.7 3.4* 3.1*    No results for input(s): LIPASE, AMYLASE in the last 168 hours. No results for input(s): AMMONIA in the last 168 hours. Coagulation Profile: No results for input(s): INR, PROTIME in the last 168 hours. Cardiac Enzymes: No results for input(s): CKTOTAL, CKMB, CKMBINDEX, TROPONINI in the last 168 hours. BNP (last 3 results) No results for input(s): PROBNP in the last 8760 hours. HbA1C: No results for input(s): HGBA1C in the last 72 hours. CBG: Recent Labs  Lab 06/28/21 1234 06/28/21 1704 06/28/21 2101 06/29/21 0719 06/29/21 1059  GLUCAP 258* 197* 167* 224* 242*    Lipid Profile: Recent Labs    06/27/21 1145  TRIG 42    Thyroid Function Tests: No results for input(s): TSH, T4TOTAL, FREET4, T3FREE, THYROIDAB in the last 72 hours. Anemia Panel: Recent Labs    06/28/21 0500 06/29/21 0353  FERRITIN 74 85    Sepsis Labs: Recent Labs  Lab 06/27/21 1145 06/27/21 1230 06/28/21 0500  PROCALCITON 5.88  --  <0.10  LATICACIDVEN  --  1.2  --      Recent Results (from the past 240  hour(s))  Resp Panel by RT-PCR (Flu A&B, Covid) Nasopharyngeal Swab     Status: Abnormal   Collection Time: 06/27/21 11:45 AM   Specimen: Nasopharyngeal Swab; Nasopharyngeal(NP) swabs in vial transport medium  Result Value Ref Range Status   SARS Coronavirus 2 by RT PCR POSITIVE (A) NEGATIVE Final    Comment: RESULT CALLED TO, READ BACK BY AND VERIFIED WITH: Trinna Balloon. RN ON 06/27/2021 @ 14247 BY MECIAL J. (NOTE) SARS-CoV-2 target nucleic acids are DETECTED.  The SARS-CoV-2 RNA is generally detectable in upper respiratory specimens during the acute phase of infection. Positive results are indicative of the presence of the identified virus, but do not rule out bacterial infection or co-infection with other pathogens not detected by the test. Clinical correlation with patient history and other diagnostic information is necessary to determine patient infection status. The expected result is Negative.  Fact Sheet for Patients: EntrepreneurPulse.com.au  Fact  Sheet for Healthcare Providers: IncredibleEmployment.be  This test is not yet approved or cleared by the [place] and  has been authorized for detection and/or diagnosis of SARS-CoV-2 by FDA under an Emergency Use Authorization (EUA).  This EUA will remain in effect (meaning t his test can be used) for the duration of  the COVID-19 declaration under Section 564(b)(1) of the Act, 21 U.S.C. section 360bbb-3(b)(1), unless the authorization is terminated or revoked sooner.     Influenza A by PCR NEGATIVE NEGATIVE Final   Influenza B by PCR NEGATIVE NEGATIVE Final    Comment: (NOTE) The Xpert Xpress SARS-CoV-2/FLU/RSV plus assay is intended as an aid in the diagnosis of influenza from Nasopharyngeal swab specimens and should not be used as a sole basis for treatment. Nasal washings and aspirates are unacceptable for Xpert Xpress SARS-CoV-2/FLU/RSV testing.  Fact Sheet for  Patients: EntrepreneurPulse.com.au  Fact Sheet for Healthcare Providers: IncredibleEmployment.be  This test is not yet approved or cleared by the [place] FDA and has been authorized for detection and/or diagnosis of SARS-CoV-2 by FDA under an Emergency Use Authorization (EUA). This EUA will remain in effect (meaning this test can be used) for the duration of the COVID-19 declaration under Section 564(b)(1) of the Act, 21 U.S.C. section 360bbb-3(b)(1), unless the authorization is terminated or revoked.  Performed at [facility], [place] [address], [place], [place] [address]   Blood Culture (routine x 2)     Status: None (Preliminary result)   Collection Time: 06/27/21 11:45 AM   Specimen: BLOOD  Result Value Ref Range Status   Specimen Description   Final    BLOOD BLOOD LEFT FOREARM Performed at [facility] [address], [place], [place] [address]    Special Requests   Final    BOTTLES DRAWN AEROBIC AND ANAEROBIC Blood Culture adequate volume Performed at [facility] [address] [place], [place] [address]    Culture   Final    NO GROWTH 2 DAYS Performed at [facility] [address], [place], [place] [address]    Report Status PENDING  Incomplete  Blood Culture (routine x 2)     Status: None (Preliminary result)   Collection Time: 06/27/21 11:47 AM   Specimen: BLOOD  Result Value Ref Range Status   Specimen Description   Final    BLOOD LEFT ANTECUBITAL Performed at [facility] [address] [place], [place] [address]    Special Requests   Final    BOTTLES DRAWN AEROBIC AND ANAEROBIC Blood Culture adequate volume Performed at [facility] [address], [place], [place] [address]    Culture   Final    NO GROWTH 2 DAYS Performed at [facility] [address] [place], [place] [address]    Report Status  PENDING  Incomplete  Urine Culture     Status: Abnormal   Collection Time: 06/27/21 11:47 AM   Specimen: Urine, Clean Catch  Result Value Ref Range Status   Specimen Description   Final    URINE, CLEAN CATCH Performed at [facility], [place] [address], [place], [place] [address]    Special Requests   Final    NONE Performed at [facility], [place] [address] [place], [place] [address]    Culture (A)  Final    >=100,000 COLONIES/mL ESCHERICHIA COLI >=100,000 COLONIES/mL KLEBSIELLA PNEUMONIAE Confirmed Extended Spectrum Beta-Lactamase Producer (ESBL).  In bloodstream  infections from ESBL organisms, carbapenems are preferred over piperacillin/tazobactam. They are shown to have a lower risk of mortality.    Report Status 06/29/2021 FINAL  Final   Organism ID, Bacteria ESCHERICHIA COLI (A)  Final   Organism ID, Bacteria KLEBSIELLA PNEUMONIAE (A)  Final      Susceptibility   Escherichia coli - MIC*    AMPICILLIN >=32 RESISTANT Resistant     CEFAZOLIN <=4 SENSITIVE Sensitive     CEFEPIME <=0.12 SENSITIVE Sensitive     CEFTRIAXONE <=0.25 SENSITIVE Sensitive     CIPROFLOXACIN 1 RESISTANT Resistant     GENTAMICIN <=1 SENSITIVE Sensitive     IMIPENEM <=0.25 SENSITIVE Sensitive     NITROFURANTOIN <=16 SENSITIVE Sensitive     TRIMETH/SULFA >=320 RESISTANT Resistant     AMPICILLIN/SULBACTAM 4 SENSITIVE Sensitive     PIP/TAZO <=4 SENSITIVE Sensitive     * >=100,000 COLONIES/mL ESCHERICHIA COLI   Klebsiella pneumoniae - MIC*    AMPICILLIN >=32 RESISTANT Resistant     CEFAZOLIN >=64 RESISTANT Resistant     CEFEPIME >=32 RESISTANT Resistant     CEFTRIAXONE >=64 RESISTANT Resistant     CIPROFLOXACIN >=4 RESISTANT Resistant     GENTAMICIN <=1 SENSITIVE Sensitive     IMIPENEM <=0.25 SENSITIVE Sensitive     NITROFURANTOIN 64 INTERMEDIATE Intermediate     TRIMETH/SULFA <=20 SENSITIVE Sensitive     AMPICILLIN/SULBACTAM >=32 RESISTANT Resistant     PIP/TAZO 16  SENSITIVE Sensitive     * >=100,000 COLONIES/mL KLEBSIELLA PNEUMONIAE          Radiology Studies: No results found.      Scheduled Meds:  albuterol  2 puff Inhalation Q6H   aspirin  81 mg Oral q morning   atorvastatin  40 mg Oral Daily   Chlorhexidine Gluconate Cloth  6 each Topical Daily   dorzolamide-timolol  1 drop Both Eyes BID   DULoxetine  60 mg Oral Daily   enalapril  20 mg Oral Daily   enoxaparin (LOVENOX) injection  40 mg Subcutaneous Q24H   famotidine  20 mg Oral Daily   ferrous sulfate  325 mg Oral Q breakfast   insulin aspart  0-9 Units Subcutaneous TID WC   insulin glargine-yfgn  10 Units Subcutaneous Daily   levothyroxine  50 mcg Oral Q0600   metFORMIN  1,000 mg Oral Q breakfast   methylPREDNISolone (SOLU-MEDROL) injection  27 mg Intravenous Q12H   polyethylene glycol  17 g Oral Daily   Semaglutide  3 mg Oral Daily   Continuous Infusions:  sodium chloride 1,000 mL (06/29/21 0027)   remdesivir 200 mg in sodium chloride 0.9% 250 mL IVPB     Followed by   remdesivir 100 mg in NS 100 mL 100 mg (06/29/21 0834)     LOS: 1 day     Georgette Shell, MD 06/29/2021, 3:17 PM

## 2021-06-29 NOTE — Plan of Care (Signed)

## 2021-06-30 DIAGNOSIS — E785 Hyperlipidemia, unspecified: Secondary | ICD-10-CM

## 2021-06-30 DIAGNOSIS — E039 Hypothyroidism, unspecified: Secondary | ICD-10-CM

## 2021-06-30 DIAGNOSIS — N390 Urinary tract infection, site not specified: Secondary | ICD-10-CM

## 2021-06-30 DIAGNOSIS — Z794 Long term (current) use of insulin: Secondary | ICD-10-CM

## 2021-06-30 DIAGNOSIS — I152 Hypertension secondary to endocrine disorders: Secondary | ICD-10-CM

## 2021-06-30 DIAGNOSIS — R8271 Bacteriuria: Secondary | ICD-10-CM

## 2021-06-30 DIAGNOSIS — E1140 Type 2 diabetes mellitus with diabetic neuropathy, unspecified: Secondary | ICD-10-CM

## 2021-06-30 DIAGNOSIS — E1159 Type 2 diabetes mellitus with other circulatory complications: Secondary | ICD-10-CM

## 2021-06-30 DIAGNOSIS — E1169 Type 2 diabetes mellitus with other specified complication: Secondary | ICD-10-CM

## 2021-06-30 LAB — CBC WITH DIFFERENTIAL/PLATELET
Abs Immature Granulocytes: 0.01 10*3/uL (ref 0.00–0.07)
Basophils Absolute: 0 10*3/uL (ref 0.0–0.1)
Basophils Relative: 0 %
Eosinophils Absolute: 0 10*3/uL (ref 0.0–0.5)
Eosinophils Relative: 0 %
HCT: 33.1 % — ABNORMAL LOW (ref 36.0–46.0)
Hemoglobin: 10.7 g/dL — ABNORMAL LOW (ref 12.0–15.0)
Immature Granulocytes: 0 %
Lymphocytes Relative: 22 %
Lymphs Abs: 0.7 10*3/uL (ref 0.7–4.0)
MCH: 31.5 pg (ref 26.0–34.0)
MCHC: 32.3 g/dL (ref 30.0–36.0)
MCV: 97.4 fL (ref 80.0–100.0)
Monocytes Absolute: 0.3 10*3/uL (ref 0.1–1.0)
Monocytes Relative: 9 %
Neutro Abs: 2.1 10*3/uL (ref 1.7–7.7)
Neutrophils Relative %: 69 %
Platelets: 197 10*3/uL (ref 150–400)
RBC: 3.4 MIL/uL — ABNORMAL LOW (ref 3.87–5.11)
RDW: 13.3 % (ref 11.5–15.5)
WBC: 3.1 10*3/uL — ABNORMAL LOW (ref 4.0–10.5)
nRBC: 0 % (ref 0.0–0.2)

## 2021-06-30 LAB — COMPREHENSIVE METABOLIC PANEL
ALT: 14 U/L (ref 0–44)
AST: 18 U/L (ref 15–41)
Albumin: 2.8 g/dL — ABNORMAL LOW (ref 3.5–5.0)
Alkaline Phosphatase: 44 U/L (ref 38–126)
Anion gap: 6 (ref 5–15)
BUN: 29 mg/dL — ABNORMAL HIGH (ref 8–23)
CO2: 25 mmol/L (ref 22–32)
Calcium: 7.8 mg/dL — ABNORMAL LOW (ref 8.9–10.3)
Chloride: 103 mmol/L (ref 98–111)
Creatinine, Ser: 0.63 mg/dL (ref 0.44–1.00)
GFR, Estimated: >60 mL/min (ref >60)
Glucose, Bld: 301 mg/dL — ABNORMAL HIGH (ref 70–99)
Potassium: 4.5 mmol/L (ref 3.5–5.1)
Sodium: 134 mmol/L — ABNORMAL LOW (ref 135–145)
Total Bilirubin: 0.5 mg/dL (ref 0.3–1.2)
Total Protein: 5.4 g/dL — ABNORMAL LOW (ref 6.5–8.1)

## 2021-06-30 LAB — GLUCOSE, CAPILLARY
Glucose-Capillary: 260 mg/dL — ABNORMAL HIGH (ref 70–99)
Glucose-Capillary: 282 mg/dL — ABNORMAL HIGH (ref 70–99)
Glucose-Capillary: 135 mg/dL — ABNORMAL HIGH (ref 70–99)
Glucose-Capillary: 116 mg/dL — ABNORMAL HIGH (ref 70–99)

## 2021-06-30 LAB — PHOSPHORUS: Phosphorus: 3.1 mg/dL (ref 2.5–4.6)

## 2021-06-30 LAB — D-DIMER, QUANTITATIVE: D-Dimer, Quant: 1.43 ug/mL-FEU — ABNORMAL HIGH (ref 0.00–0.50)

## 2021-06-30 LAB — MAGNESIUM: Magnesium: 1.5 mg/dL — ABNORMAL LOW (ref 1.7–2.4)

## 2021-06-30 LAB — FERRITIN: Ferritin: 65 ng/mL (ref 11–307)

## 2021-06-30 LAB — C-REACTIVE PROTEIN: CRP: 0.8 mg/dL (ref <1.0)

## 2021-06-30 MED ORDER — INSULIN GLARGINE-YFGN 100 UNIT/ML ~~LOC~~ SOLN
3.0000 [IU] | Freq: Once | SUBCUTANEOUS | Status: AC
  Administered 2021-06-30: 3 [IU] via SUBCUTANEOUS
  Filled 2021-06-30: qty 0.03

## 2021-06-30 MED ORDER — INSULIN GLARGINE-YFGN 100 UNIT/ML ~~LOC~~ SOLN
18.0000 [IU] | Freq: Every day | SUBCUTANEOUS | Status: DC
Start: 2021-07-01 — End: 2021-07-01
  Filled 2021-06-30: qty 0.18

## 2021-06-30 MED ORDER — MAGNESIUM SULFATE 4 GM/100ML IV SOLN
4.0000 g | Freq: Once | INTRAVENOUS | Status: AC
  Administered 2021-06-30: 4 g via INTRAVENOUS
  Filled 2021-06-30: qty 100

## 2021-06-30 MED ORDER — PREDNISONE 20 MG PO TABS
40.0000 mg | ORAL_TABLET | Freq: Every day | ORAL | Status: DC
  Administered 2021-07-01 – 2021-07-02 (×2): 40 mg via ORAL
  Filled 2021-06-30 (×2): qty 2

## 2021-06-30 MED ORDER — INSULIN ASPART 100 UNIT/ML IJ SOLN
0.0000 [IU] | Freq: Three times a day (TID) | INTRAMUSCULAR | Status: DC
  Administered 2021-06-30: 2 [IU] via SUBCUTANEOUS
  Administered 2021-07-01: 11 [IU] via SUBCUTANEOUS
  Administered 2021-07-01: 3 [IU] via SUBCUTANEOUS
  Administered 2021-07-02: 8 [IU] via SUBCUTANEOUS
  Administered 2021-07-02: 11 [IU] via SUBCUTANEOUS
  Administered 2021-07-02: 5 [IU] via SUBCUTANEOUS

## 2021-06-30 MED ORDER — HYDRALAZINE HCL 20 MG/ML IJ SOLN: 5.0000 mg | Freq: Four times a day (QID) | INTRAMUSCULAR | Status: DC | PRN

## 2021-06-30 MED ORDER — BRIMONIDINE TARTRATE 0.2 % OP SOLN
1.0000 [drp] | Freq: Two times a day (BID) | OPHTHALMIC | Status: DC
  Administered 2021-06-30 – 2021-07-02 (×3): 1 [drp] via OPHTHALMIC
  Filled 2021-06-30: qty 5

## 2021-06-30 MED ORDER — LATANOPROSTENE BUNOD 0.024 % OP SOLN: 1.0000 [drp] | Freq: Every day | OPHTHALMIC | Status: DC

## 2021-06-30 NOTE — Care Management Important Message (Signed)
Important Message  Patient Details IM Letter placed in door caddy for daughter [name]. Name: Hayley Fisher MRN: [id number] Date of Birth: [birth year]   Medicare Important Message Given:  Yes     Kerin Salen 06/30/2021, 11:09 AM

## 2021-06-30 NOTE — Progress Notes (Addendum)
PROGRESS NOTE    Hayley Fisher  RAQ:[id number] DOB: 1931-02-19 DOA: 06/27/2021 PCP: [name], MD    Chief Complaint  Patient presents with   Covid Positive    Brief Narrative:  87 year old female admitted with cough fever found to have [place].  She was febrile 101.1 and tachycardic with a heart rate of 103 on admission.  She has a chronic indwelling Foley catheter with history of recurrent UTIs.  She lives at home with her daughter.  Daughter reported that she was very confused disoriented and delirious. Urine culture positive for E. coli and Klebsiella.  She received 1 dose of Rocephin in the ER.  Her procalcitonin was high on admission and is trending down to normal.     Assessment & Plan:   Principal Problem:   COVID-19 virus infection Active Problems:   Type 2 diabetes mellitus with diabetic neuropathy (HCC)   Hyperlipidemia associated with type 2 diabetes mellitus ([place])   Hypertension associated with diabetes ([place])   Uncontrolled type 2 diabetes mellitus with hyperglycemia ([place])   Hypothyroidism   Normocytic anemia   Leukocytopenia   Asymptomatic bacteriuria  #1 COVID-19 virus infection -Patient admitted with fever, tachypnea, tachycardia. -Chest x-ray negative for any acute infiltrates. -Patient presented with cough and shortness of breath. -Patient not hypoxic. -Fever curve trended down. -Continue IV remdesivir to finish course tomorrow 07/01/2021. -Continue IV prednisone and transition to quick oral steroid taper tomorrow. -Continue albuterol inhaler.  2.  Urine culture with ESBL -Felt likely secondary to colonization per ID who recommended as patient asymptomatic and symptoms of fever and confusion improved without treatment for UTI no further antibiotics warranted at this time. -Exchange Foley catheter if not done.  3.  Type 2 diabetes mellitus -Hemoglobin A1c 8.0 in October 2022. -CBG 260 this morning. -Increase Semglee to 18 units daily,  SSI. -Continue metformin.  4.  Hypothyroidism -Synthroid.  5.  Hyperlipidemia -Statin.  6.  Hypertension -Continue enalapril.  Hydralazine as needed.  7.  Hypomagnesemia -Magnesium sulfate 4 g IV x1. -Repeat labs in the morning.   DVT prophylaxis: Lovenox Code Status: Full Family Communication: Updated patient.  Updated daughter via telephone.  Disposition:   Status is: Inpatient  Remains inpatient appropriate because: Severity of illness       Consultants:  None  Procedures:  Chest x-ray 06/27/2021  Antimicrobials: IV Rocephin 06/27/2021 >>>>> 06/29/2021 IV remdesivir 06/27/2021>>>>> 07/01/2021    Subjective: Sitting up in bed.  No chest pain.  No shortness of breath.  No abdominal pain.  Feels Foley catheter is not working well although it is draining clear urine.  Objective: Vitals:   06/29/21 1104 06/29/21 2159 06/30/21 0552 06/30/21 1150  BP: 139/73 (!) 150/79 (!) 177/96 (!) 171/94  Pulse: 87 87 88 82  Resp: 16 20 20 20   Temp: 98.2 F (36.8 C) 99.1 F (37.3 C) 98.5 F (36.9 C) 97.7 F (36.5 C)  TempSrc: Oral Oral Oral Axillary  SpO2: 96% 100% 100% 100%  Weight:      Height:        Intake/Output Summary (Last 24 hours) at 06/30/2021 1420 Last data filed at 06/30/2021 1300 Gross per 24 hour  Intake 2475.95 ml  Output 1800 ml  Net 675.95 ml   Filed Weights   06/27/21 1701 06/28/21 1648  Weight: 56.5 kg 60.4 kg    Examination:  General exam: Appears calm and comfortable  Respiratory system: Clear to auscultation. Respiratory effort normal. Cardiovascular system: S1 & S2 heard, RRR. No JVD,  murmurs, rubs, gallops or clicks. No pedal edema. Gastrointestinal system: Abdomen is nondistended, soft and nontender. No organomegaly or masses felt. Normal bowel sounds heard. Central nervous system: Alert and oriented. No focal neurological deficits. Extremities: Symmetric 5 x 5 power. Skin: No rashes, lesions or ulcers Psychiatry: Judgement and  insight appear normal. Mood & affect appropriate.     Data Reviewed: I have personally reviewed following labs and imaging studies  CBC: Recent Labs  Lab 06/27/21 1145 06/28/21 0500 06/29/21 0353 06/30/21 0409  WBC 3.0* 3.3* 4.5 3.1*  NEUTROABS 2.1 2.4 3.6 2.1  HGB 11.2* 11.6* 11.8* 10.7*  HCT 34.7* 35.2* 36.2 33.1*  MCV 98.3 96.4 95.8 97.4  PLT 183 175 205 932    Basic Metabolic Panel: Recent Labs  Lab 06/27/21 1145 06/28/21 0500 06/29/21 0353 06/30/21 0409  NA 135 136 132* 134*  K 4.2 4.5 4.4 4.5  CL 98 101 98 103  CO2 29 27 26 25   GLUCOSE 214* 304* 244* 301*  BUN 21 22 25* 29*  CREATININE 0.70 0.66 0.68 0.63  CALCIUM 8.2* 8.2* 8.0* 7.8*  MG  --  1.8 1.6* 1.5*  PHOS  --  3.8 3.8 3.1    GFR: Estimated Creatinine Clearance: 41.6 mL/min (by C-G formula based on SCr of 0.63 mg/dL).  Liver Function Tests: Recent Labs  Lab 06/27/21 1145 06/28/21 0500 06/29/21 0353 06/30/21 0409  AST 22 22 20 18   ALT 15 15 14 14   ALKPHOS 58 53 45 44  BILITOT 1.0 0.7 0.4 0.5  PROT 6.8 6.3* 6.0* 5.4*  ALBUMIN 3.7 3.4* 3.1* 2.8*    CBG: Recent Labs  Lab 06/29/21 1059 06/29/21 1643 06/29/21 2200 06/30/21 0722 06/30/21 1146  GLUCAP 242* 271* 215* 260* 282*     Recent Results (from the past 240 hour(s))  Resp Panel by RT-PCR (Flu A&B, Covid) Nasopharyngeal Swab     Status: Abnormal   Collection Time: 06/27/21 11:45 AM   Specimen: Nasopharyngeal Swab; Nasopharyngeal(NP) swabs in vial transport medium  Result Value Ref Range Status   SARS Coronavirus 2 by RT PCR POSITIVE (A) NEGATIVE Final    Comment: RESULT CALLED TO, READ BACK BY AND VERIFIED WITH: Trinna Balloon. RN ON 06/27/2021 @ 14247 BY MECIAL J. (NOTE) SARS-CoV-2 target nucleic acids are DETECTED.  The SARS-CoV-2 RNA is generally detectable in upper respiratory specimens during the acute phase of infection. Positive results are indicative of the presence of the identified virus, but do not rule out bacterial  infection or co-infection with other pathogens not detected by the test. Clinical correlation with patient history and other diagnostic information is necessary to determine patient infection status. The expected result is Negative.  Fact Sheet for Patients: EntrepreneurPulse.com.au  Fact Sheet for Healthcare Providers: IncredibleEmployment.be  This test is not yet approved or cleared by the [place] FDA and  has been authorized for detection and/or diagnosis of SARS-CoV-2 by FDA under an Emergency Use Authorization (EUA).  This EUA will remain in effect (meaning t his test can be used) for the duration of  the COVID-19 declaration under Section 564(b)(1) of the Act, 21 U.S.C. section 360bbb-3(b)(1), unless the authorization is terminated or revoked sooner.     Influenza A by PCR NEGATIVE NEGATIVE Final   Influenza B by PCR NEGATIVE NEGATIVE Final    Comment: (NOTE) The Xpert Xpress SARS-CoV-2/FLU/RSV plus assay is intended as an aid in the diagnosis of influenza from Nasopharyngeal swab specimens and should not be used as a sole basis  for treatment. Nasal washings and aspirates are unacceptable for Xpert Xpress SARS-CoV-2/FLU/RSV testing.  Fact Sheet for Patients: EntrepreneurPulse.com.au  Fact Sheet for Healthcare Providers: IncredibleEmployment.be  This test is not yet approved or cleared by the [place] FDA and has been authorized for detection and/or diagnosis of SARS-CoV-2 by FDA under an Emergency Use Authorization (EUA). This EUA will remain in effect (meaning this test can be used) for the duration of the COVID-19 declaration under Section 564(b)(1) of the Act, 21 U.S.C. section 360bbb-3(b)(1), unless the authorization is terminated or revoked.  Performed at [facility], [place] [address], [place], [place] [address]   Blood Culture (routine x 2)     Status: None  (Preliminary result)   Collection Time: 06/27/21 11:45 AM   Specimen: BLOOD  Result Value Ref Range Status   Specimen Description   Final    BLOOD BLOOD LEFT FOREARM Performed at [facility] [address], [place], [place] [address]    Special Requests   Final    BOTTLES DRAWN AEROBIC AND ANAEROBIC Blood Culture adequate volume Performed at [facility] [address] [place], [place] [address]    Culture   Final    NO GROWTH 3 DAYS Performed at [facility], [place] [address], [place], [place] [address]    Report Status PENDING  Incomplete  Blood Culture (routine x 2)     Status: None (Preliminary result)   Collection Time: 06/27/21 11:47 AM   Specimen: BLOOD  Result Value Ref Range Status   Specimen Description   Final    BLOOD LEFT ANTECUBITAL Performed at [facility] [address], [place], [place] [address]    Special Requests   Final    BOTTLES DRAWN AEROBIC AND ANAEROBIC Blood Culture adequate volume Performed at [facility] [address], [place], [place] [address]    Culture   Final    NO GROWTH 3 DAYS Performed at [facility], [place] [address], [place], [place] [address]    Report Status PENDING  Incomplete  Urine Culture     Status: Abnormal   Collection Time: 06/27/21 11:47 AM   Specimen: Urine, Clean Catch  Result Value Ref Range Status   Specimen Description   Final    URINE, CLEAN CATCH Performed at [facility], [place] [address] [place], [place] [address]    Special Requests   Final    NONE Performed at [facility], [place] [address], [place],  [address]    Culture (A)  Final    >=100,000 COLONIES/mL ESCHERICHIA COLI >=100,000 COLONIES/mL KLEBSIELLA PNEUMONIAE Confirmed Extended Spectrum Beta-Lactamase Producer (ESBL).  In bloodstream infections from ESBL organisms, carbapenems are preferred over  piperacillin/tazobactam. They are shown to have a lower risk of mortality.    Report Status 06/29/2021 FINAL  Final   Organism ID, Bacteria ESCHERICHIA COLI (A)  Final   Organism ID, Bacteria KLEBSIELLA PNEUMONIAE (A)  Final      Susceptibility   Escherichia coli - MIC*    AMPICILLIN >=32 RESISTANT Resistant     CEFAZOLIN <=4 SENSITIVE Sensitive     CEFEPIME <=0.12 SENSITIVE Sensitive     CEFTRIAXONE <=0.25 SENSITIVE Sensitive     CIPROFLOXACIN 1 RESISTANT Resistant     GENTAMICIN <=1 SENSITIVE Sensitive     IMIPENEM <=0.25 SENSITIVE Sensitive     NITROFURANTOIN <=16 SENSITIVE Sensitive     TRIMETH/SULFA >=320 RESISTANT Resistant  AMPICILLIN/SULBACTAM 4 SENSITIVE Sensitive     PIP/TAZO <=4 SENSITIVE Sensitive     * >=100,000 COLONIES/mL ESCHERICHIA COLI   Klebsiella pneumoniae - MIC*    AMPICILLIN >=32 RESISTANT Resistant     CEFAZOLIN >=64 RESISTANT Resistant     CEFEPIME >=32 RESISTANT Resistant     CEFTRIAXONE >=64 RESISTANT Resistant     CIPROFLOXACIN >=4 RESISTANT Resistant     GENTAMICIN <=1 SENSITIVE Sensitive     IMIPENEM <=0.25 SENSITIVE Sensitive     NITROFURANTOIN 64 INTERMEDIATE Intermediate     TRIMETH/SULFA <=20 SENSITIVE Sensitive     AMPICILLIN/SULBACTAM >=32 RESISTANT Resistant     PIP/TAZO 16 SENSITIVE Sensitive     * >=100,000 COLONIES/mL KLEBSIELLA PNEUMONIAE         Radiology Studies: No results found.      Scheduled Meds:  albuterol  2 puff Inhalation Q6H   aspirin  81 mg Oral q morning   atorvastatin  40 mg Oral Daily   Chlorhexidine Gluconate Cloth  6 each Topical Daily   dorzolamide-timolol  1 drop Both Eyes BID   DULoxetine  60 mg Oral Daily   enalapril  20 mg Oral Daily   enoxaparin (LOVENOX) injection  40 mg Subcutaneous Q24H   famotidine  20 mg Oral Daily   ferrous sulfate  325 mg Oral Q breakfast   insulin aspart  0-9 Units Subcutaneous TID WC   [START ON 07/01/2021] insulin glargine-yfgn  18 Units Subcutaneous Daily    levothyroxine  50 mcg Oral Q0600   metFORMIN  1,000 mg Oral Q breakfast   methylPREDNISolone (SOLU-MEDROL) injection  27 mg Intravenous Q12H   polyethylene glycol  17 g Oral Daily   Semaglutide  3 mg Oral Daily   Continuous Infusions:  remdesivir 200 mg in sodium chloride 0.9% 250 mL IVPB       LOS: 2 days    Time spent: 35 minutes    Irine Seal, MD [facility]   To contact the attending provider between 7A-7P or the covering provider during after hours 7P-7A, please log into the web site [email or web address] and access using universal [place] password for that web site. If you do not have the password, please call the hospital operator.  06/30/2021, 2:20 PM

## 2021-06-30 NOTE — Plan of Care (Signed)
  Problem: Education: Goal: Knowledge of General Education information will improve Description: Including pain rating scale, medication(s)/side effects and non-pharmacologic comfort measures Outcome: Progressing   Problem: Clinical Measurements: Goal: Will remain free from infection Outcome: Progressing Goal: Diagnostic test results will improve Outcome: Progressing Goal: Respiratory complications will improve Outcome: Progressing   

## 2021-07-01 ENCOUNTER — Other Ambulatory Visit: Payer: Self-pay | Admitting: Hospice

## 2021-07-01 DIAGNOSIS — K649 Unspecified hemorrhoids: Secondary | ICD-10-CM

## 2021-07-01 LAB — CBC WITH DIFFERENTIAL/PLATELET
Abs Immature Granulocytes: 0.01 10*3/uL (ref 0.00–0.07)
Basophils Absolute: 0 10*3/uL (ref 0.0–0.1)
Basophils Relative: 0 %
Eosinophils Absolute: 0 10*3/uL (ref 0.0–0.5)
Eosinophils Relative: 0 %
HCT: 33.7 % — ABNORMAL LOW (ref 36.0–46.0)
Hemoglobin: 11.2 g/dL — ABNORMAL LOW (ref 12.0–15.0)
Immature Granulocytes: 0 %
Lymphocytes Relative: 29 %
Lymphs Abs: 0.7 10*3/uL (ref 0.7–4.0)
MCH: 32 pg (ref 26.0–34.0)
MCHC: 33.2 g/dL (ref 30.0–36.0)
MCV: 96.3 fL (ref 80.0–100.0)
Monocytes Absolute: 0.2 10*3/uL (ref 0.1–1.0)
Monocytes Relative: 9 %
Neutro Abs: 1.5 10*3/uL — ABNORMAL LOW (ref 1.7–7.7)
Neutrophils Relative %: 62 %
Platelets: 233 10*3/uL (ref 150–400)
RBC: 3.5 MIL/uL — ABNORMAL LOW (ref 3.87–5.11)
RDW: 13.2 % (ref 11.5–15.5)
WBC: 2.5 10*3/uL — ABNORMAL LOW (ref 4.0–10.5)
nRBC: 0 % (ref 0.0–0.2)

## 2021-07-01 LAB — GLUCOSE, CAPILLARY
Glucose-Capillary: 56 mg/dL — ABNORMAL LOW (ref 70–99)
Glucose-Capillary: 64 mg/dL — ABNORMAL LOW (ref 70–99)
Glucose-Capillary: 131 mg/dL — ABNORMAL HIGH (ref 70–99)
Glucose-Capillary: 185 mg/dL — ABNORMAL HIGH (ref 70–99)
Glucose-Capillary: 303 mg/dL — ABNORMAL HIGH (ref 70–99)
Glucose-Capillary: 371 mg/dL — ABNORMAL HIGH (ref 70–99)

## 2021-07-01 LAB — COMPREHENSIVE METABOLIC PANEL
ALT: 13 U/L (ref 0–44)
AST: 19 U/L (ref 15–41)
Albumin: 2.6 g/dL — ABNORMAL LOW (ref 3.5–5.0)
Alkaline Phosphatase: 37 U/L — ABNORMAL LOW (ref 38–126)
Anion gap: 5 (ref 5–15)
BUN: 21 mg/dL (ref 8–23)
CO2: 27 mmol/L (ref 22–32)
Calcium: 7.8 mg/dL — ABNORMAL LOW (ref 8.9–10.3)
Chloride: 101 mmol/L (ref 98–111)
Creatinine, Ser: 0.54 mg/dL (ref 0.44–1.00)
GFR, Estimated: >60 mL/min (ref >60)
Glucose, Bld: 78 mg/dL (ref 70–99)
Potassium: 4 mmol/L (ref 3.5–5.1)
Sodium: 133 mmol/L — ABNORMAL LOW (ref 135–145)
Total Bilirubin: 0.6 mg/dL (ref 0.3–1.2)
Total Protein: 5.3 g/dL — ABNORMAL LOW (ref 6.5–8.1)

## 2021-07-01 LAB — MAGNESIUM: Magnesium: 1.9 mg/dL (ref 1.7–2.4)

## 2021-07-01 LAB — C-REACTIVE PROTEIN: CRP: 0.6 mg/dL (ref <1.0)

## 2021-07-01 LAB — FERRITIN: Ferritin: 63 ng/mL (ref 11–307)

## 2021-07-01 LAB — PHOSPHORUS: Phosphorus: 2.8 mg/dL (ref 2.5–4.6)

## 2021-07-01 LAB — D-DIMER, QUANTITATIVE: D-Dimer, Quant: 1.35 ug/mL-FEU — ABNORMAL HIGH (ref 0.00–0.50)

## 2021-07-01 MED ORDER — INSULIN GLARGINE-YFGN 100 UNIT/ML ~~LOC~~ SOLN
10.0000 [IU] | Freq: Every day | SUBCUTANEOUS | Status: DC
  Administered 2021-07-01 – 2021-07-02 (×2): 10 [IU] via SUBCUTANEOUS
  Filled 2021-07-01 (×2): qty 0.1

## 2021-07-01 MED ORDER — CAMPHOR-MENTHOL 0.5-0.5 % EX LOTN: 1.0000 "application " | TOPICAL_LOTION | Freq: Four times a day (QID) | CUTANEOUS | Status: DC | PRN

## 2021-07-01 MED ORDER — HYDROXYZINE HCL 10 MG PO TABS: 10.0000 mg | ORAL_TABLET | ORAL | Status: DC | PRN

## 2021-07-01 MED ORDER — HYDROCORTISONE (PERIANAL) 2.5 % EX CREA
TOPICAL_CREAM | Freq: Two times a day (BID) | CUTANEOUS | Status: DC
  Filled 2021-07-01: qty 28.35

## 2021-07-01 MED ORDER — SENNA 8.6 MG PO TABS
1.0000 | ORAL_TABLET | Freq: Two times a day (BID) | ORAL | Status: DC | PRN
  Filled 2021-07-01: qty 1

## 2021-07-01 MED ORDER — DOCUSATE SODIUM 100 MG PO CAPS
100.0000 mg | ORAL_CAPSULE | Freq: Every morning | ORAL | Status: DC
  Filled 2021-07-01 (×2): qty 1

## 2021-07-01 MED ORDER — INSULIN GLARGINE-YFGN 100 UNIT/ML ~~LOC~~ SOLN: 5.0000 [IU] | Freq: Every day | SUBCUTANEOUS | Status: DC

## 2021-07-01 NOTE — Progress Notes (Signed)
PROGRESS NOTE    Hayley Fisher  JOI:[id number] DOB: 12-08-1930 DOA: 06/27/2021 PCP: [name], MD    Chief Complaint  Patient presents with   Covid Positive    Brief Narrative:  87 year old female admitted with cough fever found to have [place].  She was febrile 101.1 and tachycardic with a heart rate of 103 on admission.  She has a chronic indwelling Foley catheter with history of recurrent UTIs.  She lives at home with her daughter.  Daughter reported that she was very confused disoriented and delirious. Urine culture positive for E. coli and Klebsiella.  She received 1 dose of Rocephin in the ER.  Her procalcitonin was high on admission and is trending down to normal.     Assessment & Plan:   Principal Problem:   COVID-19 virus infection Active Problems:   Type 2 diabetes mellitus with diabetic neuropathy (HCC)   Hyperlipidemia associated with type 2 diabetes mellitus ([place])   Hypertension associated with diabetes ([place])   Urinary tract infection without hematuria   Uncontrolled type 2 diabetes mellitus with hyperglycemia (HCC)   Hypothyroidism   Normocytic anemia   Leukocytopenia   Asymptomatic bacteriuria  #1 COVID-19 virus infection -Patient admitted with fever, tachypnea, tachycardia. -Chest x-ray negative for any acute infiltrates. -Patient presented with cough and shortness of breath. -Patient not hypoxic. -Fever curve trended down. -Status post full course of IV remdesivir which was completed today.  -Patient transition from IV Solu-Medrol to prednisone taper.   -Follow.   2.  Urine culture with ESBL -Felt likely secondary to colonization per ID who recommended as patient asymptomatic and symptoms of fever and confusion improved without treatment for UTI no further antibiotics warranted at this time. -Exchange Foley catheter if not done.  3.  Type 2 diabetes mellitus -Hemoglobin A1c 8.0 in October 2022. -CBG noted at 56 this morning.   -Discontinue  Semglee, metformin, Ozempic.   -Likely resume Semglee at 10 units daily tomorrow if blood sugars have improved.   -Continue SSI.  4.  Hypothyroidism -Synthroid.  5.  Hyperlipidemia -Statin.  6.  Hypertension -Enalapril.   -Hydralazine as needed.   7.  Hypomagnesemia -Magnesium at 1.9.   -Repeat labs in the morning.   8.  Hemorrhoids -Patient with complaints of hemorrhoidal pain. -Resume home regimen of Anusol rectal cream twice daily.   DVT prophylaxis: Lovenox Code Status: Full Family Communication: Updated patient.  Updated daughter via telephone.  Disposition:   Status is: Inpatient  Remains inpatient appropriate because: Severity of illness       Consultants:  None  Procedures:  Chest x-ray 06/27/2021  Antimicrobials: IV Rocephin 06/27/2021 >>>>> 06/29/2021 IV remdesivir 06/27/2021>>>>> 07/01/2021    Subjective: Patient sitting up in chair, complains of hemorrhoidal pain.  Denies any shortness of breath.  No chest pain.  No abdominal pain.  Patient noted to have low glucose levels of 56 this morning.  Objective: Vitals:   06/30/21 1150 06/30/21 2120 07/01/21 0629 07/01/21 1203  BP: (!) 171/94 (!) 157/86 (!) 166/91 (!) 155/77  Pulse: 82 83 76 79  Resp: 20 17 17 16   Temp: 97.7 F (36.5 C) 98.9 F (37.2 C) 98.8 F (37.1 C) 98.1 F (36.7 C)  TempSrc: Axillary Oral Oral Oral  SpO2: 100% 98% 100% 100%  Weight:      Height:        Intake/Output Summary (Last 24 hours) at 07/01/2021 1313 Last data filed at 07/01/2021 1058 Gross per 24 hour  Intake 100 ml  Output 1545 ml  Net -1445 ml    Filed Weights   06/27/21 1701 06/28/21 1648  Weight: 56.5 kg 60.4 kg    Examination:  General exam: : NAD Respiratory system: CTA B anterior lung fields.  No wheezes, no rhonchi.  Speaking in full sentences.  Normal respiratory effort. Cardiovascular system: Regular rate and rhythm no murmurs rubs or gallops.  No JVD.  No lower extremity edema.   Gastrointestinal system: Abdomen soft, nontender, nondistended, positive bowel sounds.  No rebound.  No guarding. Central nervous system: Alert and oriented. No focal neurological deficits. Extremities: Symmetric 5 x 5 power. Skin: No rashes, lesions or ulcers Psychiatry: Judgement and insight appear normal. Mood & affect appropriate.   Data Reviewed: I have personally reviewed following labs and imaging studies  CBC: Recent Labs  Lab 06/27/21 1145 06/28/21 0500 06/29/21 0353 06/30/21 0409 07/01/21 0405  WBC 3.0* 3.3* 4.5 3.1* 2.5*  NEUTROABS 2.1 2.4 3.6 2.1 1.5*  HGB 11.2* 11.6* 11.8* 10.7* 11.2*  HCT 34.7* 35.2* 36.2 33.1* 33.7*  MCV 98.3 96.4 95.8 97.4 96.3  PLT 183 175 205 197 233     Basic Metabolic Panel: Recent Labs  Lab 06/27/21 1145 06/28/21 0500 06/29/21 0353 06/30/21 0409 07/01/21 0405  NA 135 136 132* 134* 133*  K 4.2 4.5 4.4 4.5 4.0  CL 98 101 98 103 101  CO2 29 27 26 25 27   GLUCOSE 214* 304* 244* 301* 78  BUN 21 22 25* 29* 21  CREATININE 0.70 0.66 0.68 0.63 0.54  CALCIUM 8.2* 8.2* 8.0* 7.8* 7.8*  MG  --  1.8 1.6* 1.5* 1.9  PHOS  --  3.8 3.8 3.1 2.8     GFR: Estimated Creatinine Clearance: 41.6 mL/min (by C-G formula based on SCr of 0.54 mg/dL).  Liver Function Tests: Recent Labs  Lab 06/27/21 1145 06/28/21 0500 06/29/21 0353 06/30/21 0409 07/01/21 0405  AST 22 22 20 18 19   ALT 15 15 14 14 13   ALKPHOS 58 53 45 44 37*  BILITOT 1.0 0.7 0.4 0.5 0.6  PROT 6.8 6.3* 6.0* 5.4* 5.3*  ALBUMIN 3.7 3.4* 3.1* 2.8* 2.6*     CBG: Recent Labs  Lab 06/30/21 2117 07/01/21 0737 07/01/21 0817 07/01/21 0950 07/01/21 1200  GLUCAP 116* 56* 64* 131* 185*      Recent Results (from the past 240 hour(s))  Resp Panel by RT-PCR (Flu A&B, Covid) Nasopharyngeal Swab     Status: Abnormal   Collection Time: 06/27/21 11:45 AM   Specimen: Nasopharyngeal Swab; Nasopharyngeal(NP) swabs in vial transport medium  Result Value Ref Range Status   SARS  Coronavirus 2 by RT PCR POSITIVE (A) NEGATIVE Final    Comment: RESULT CALLED TO, READ BACK BY AND VERIFIED WITH: Trinna Balloon. RN ON 06/27/2021 @ 14247 BY MECIAL J. (NOTE) SARS-CoV-2 target nucleic acids are DETECTED.  The SARS-CoV-2 RNA is generally detectable in upper respiratory specimens during the acute phase of infection. Positive results are indicative of the presence of the identified virus, but do not rule out bacterial infection or co-infection with other pathogens not detected by the test. Clinical correlation with patient history and other diagnostic information is necessary to determine patient infection status. The expected result is Negative.  Fact Sheet for Patients: EntrepreneurPulse.com.au  Fact Sheet for Healthcare Providers: IncredibleEmployment.be  This test is not yet approved or cleared by the [place] FDA and  has been authorized for detection and/or diagnosis of SARS-CoV-2 by FDA under an Emergency Use Authorization (EUA).  This EUA will remain in effect (meaning t his test can be used) for the duration of  the COVID-19 declaration under Section 564(b)(1) of the Act, 21 U.S.C. section 360bbb-3(b)(1), unless the authorization is terminated or revoked sooner.     Influenza A by PCR NEGATIVE NEGATIVE Final   Influenza B by PCR NEGATIVE NEGATIVE Final    Comment: (NOTE) The Xpert Xpress SARS-CoV-2/FLU/RSV plus assay is intended as an aid in the diagnosis of influenza from Nasopharyngeal swab specimens and should not be used as a sole basis for treatment. Nasal washings and aspirates are unacceptable for Xpert Xpress SARS-CoV-2/FLU/RSV testing.  Fact Sheet for Patients: EntrepreneurPulse.com.au  Fact Sheet for Healthcare Providers: IncredibleEmployment.be  This test is not yet approved or cleared by the [place] FDA and has been authorized for detection and/or diagnosis of  SARS-CoV-2 by FDA under an Emergency Use Authorization (EUA). This EUA will remain in effect (meaning this test can be used) for the duration of the COVID-19 declaration under Section 564(b)(1) of the Act, 21 U.S.C. section 360bbb-3(b)(1), unless the authorization is terminated or revoked.  Performed at [facility], [place] [address], [place], [place] [address]   Blood Culture (routine x 2)     Status: None (Preliminary result)   Collection Time: 06/27/21 11:45 AM   Specimen: BLOOD  Result Value Ref Range Status   Specimen Description   Final    BLOOD BLOOD LEFT FOREARM Performed at [place] [address] [place], [place] [address]    Special Requests   Final    BOTTLES DRAWN AEROBIC AND ANAEROBIC Blood Culture adequate volume Performed at [place] [address], [place], [place] [address]    Culture   Final    NO GROWTH 4 DAYS Performed at [facility], [place] [address] [place], [place] [address]    Report Status PENDING  Incomplete  Blood Culture (routine x 2)     Status: None (Preliminary result)   Collection Time: 06/27/21 11:47 AM   Specimen: BLOOD  Result Value Ref Range Status   Specimen Description   Final    BLOOD LEFT ANTECUBITAL Performed at [facility] [address] [place], [place] [address]    Special Requests   Final    BOTTLES DRAWN AEROBIC AND ANAEROBIC Blood Culture adequate volume Performed at [place] [facility] [address] [place], [place] [address]    Culture   Final    NO GROWTH 4 DAYS Performed at [facility], [place] [address] [place], [place] [address]    Report Status PENDING  Incomplete  Urine Culture     Status: Abnormal   Collection Time: 06/27/21 11:47 AM   Specimen: Urine, Clean Catch  Result Value Ref Range Status   Specimen Description   Final    URINE, CLEAN CATCH Performed at [facility], [place]  [address], [place], [place] [address]    Special Requests   Final    NONE Performed at [facility], [place] [address] [place], [place] [address]    Culture (A)  Final    >=100,000 COLONIES/mL ESCHERICHIA COLI >=100,000 COLONIES/mL KLEBSIELLA PNEUMONIAE Confirmed Extended Spectrum Beta-Lactamase Producer (ESBL).  In bloodstream infections from ESBL organisms, carbapenems are preferred over piperacillin/tazobactam. They are shown to have a lower risk of mortality.    Report Status 06/29/2021 FINAL  Final   Organism ID, Bacteria ESCHERICHIA COLI (A)  Final  Organism ID, Bacteria KLEBSIELLA PNEUMONIAE (A)  Final      Susceptibility   Escherichia coli - MIC*    AMPICILLIN >=32 RESISTANT Resistant     CEFAZOLIN <=4 SENSITIVE Sensitive     CEFEPIME <=0.12 SENSITIVE Sensitive     CEFTRIAXONE <=0.25 SENSITIVE Sensitive     CIPROFLOXACIN 1 RESISTANT Resistant     GENTAMICIN <=1 SENSITIVE Sensitive     IMIPENEM <=0.25 SENSITIVE Sensitive     NITROFURANTOIN <=16 SENSITIVE Sensitive     TRIMETH/SULFA >=320 RESISTANT Resistant     AMPICILLIN/SULBACTAM 4 SENSITIVE Sensitive     PIP/TAZO <=4 SENSITIVE Sensitive     * >=100,000 COLONIES/mL ESCHERICHIA COLI   Klebsiella pneumoniae - MIC*    AMPICILLIN >=32 RESISTANT Resistant     CEFAZOLIN >=64 RESISTANT Resistant     CEFEPIME >=32 RESISTANT Resistant     CEFTRIAXONE >=64 RESISTANT Resistant     CIPROFLOXACIN >=4 RESISTANT Resistant     GENTAMICIN <=1 SENSITIVE Sensitive     IMIPENEM <=0.25 SENSITIVE Sensitive     NITROFURANTOIN 64 INTERMEDIATE Intermediate     TRIMETH/SULFA <=20 SENSITIVE Sensitive     AMPICILLIN/SULBACTAM >=32 RESISTANT Resistant     PIP/TAZO 16 SENSITIVE Sensitive     * >=100,000 COLONIES/mL KLEBSIELLA PNEUMONIAE          Radiology Studies: No results found.      Scheduled Meds:  albuterol  2 puff Inhalation Q6H   aspirin  81 mg Oral q morning   atorvastatin  40 mg Oral Daily   brimonidine   1 drop Left Eye BID   Chlorhexidine Gluconate Cloth  6 each Topical Daily   dorzolamide-timolol  1 drop Both Eyes BID   DULoxetine  60 mg Oral Daily   enalapril  20 mg Oral Daily   enoxaparin (LOVENOX) injection  40 mg Subcutaneous Q24H   famotidine  20 mg Oral Daily   ferrous sulfate  325 mg Oral Q breakfast   insulin aspart  0-15 Units Subcutaneous TID WC   Latanoprostene Bunod  1 drop Both Eyes QHS   levothyroxine  50 mcg Oral Q0600   polyethylene glycol  17 g Oral Daily   predniSONE  40 mg Oral QAC breakfast   Continuous Infusions:     LOS: 3 days    Time spent: 35 minutes    Irine Seal, MD [facility]   To contact the attending provider between 7A-7P or the covering provider during after hours 7P-7A, please log into the web site [email or web address] and access using universal [place] password for that web site. If you do not have the password, please call the hospital operator.  07/01/2021, 1:13 PM

## 2021-07-01 NOTE — Progress Notes (Signed)
NP went for scheduled visit and was informed by [name] that patient was on hospital admission for [place] - 19. Visit rescheduled for 07/07/2021 2pm.

## 2021-07-01 NOTE — Plan of Care (Signed)

## 2021-07-01 NOTE — Progress Notes (Addendum)
Patient's FSBS 56, gave 4 ounces orange juice to drink, will recheck FSBS in 15 minutes    Recheck FSBS at 0817 64, another 4 ounce apple juice given by NT and breakfast. Will recheck after finishing meal. Patient not showing any symptoms of low BS.

## 2021-07-02 DIAGNOSIS — E1165 Type 2 diabetes mellitus with hyperglycemia: Secondary | ICD-10-CM

## 2021-07-02 LAB — CULTURE, BLOOD (ROUTINE X 2)
Culture: NO GROWTH
Culture: NO GROWTH
Special Requests: ADEQUATE
Special Requests: ADEQUATE

## 2021-07-02 LAB — MAGNESIUM: Magnesium: 1.8 mg/dL (ref 1.7–2.4)

## 2021-07-02 LAB — CBC WITH DIFFERENTIAL/PLATELET
Abs Immature Granulocytes: 0.02 10*3/uL (ref 0.00–0.07)
Basophils Absolute: 0 10*3/uL (ref 0.0–0.1)
Basophils Relative: 0 %
Eosinophils Absolute: 0 10*3/uL (ref 0.0–0.5)
Eosinophils Relative: 0 %
HCT: 35 % — ABNORMAL LOW (ref 36.0–46.0)
Hemoglobin: 11.6 g/dL — ABNORMAL LOW (ref 12.0–15.0)
Immature Granulocytes: 1 %
Lymphocytes Relative: 31 %
Lymphs Abs: 0.8 10*3/uL (ref 0.7–4.0)
MCH: 30.9 pg (ref 26.0–34.0)
MCHC: 33.1 g/dL (ref 30.0–36.0)
MCV: 93.3 fL (ref 80.0–100.0)
Monocytes Absolute: 0.3 10*3/uL (ref 0.1–1.0)
Monocytes Relative: 12 %
Neutro Abs: 1.4 10*3/uL — ABNORMAL LOW (ref 1.7–7.7)
Neutrophils Relative %: 56 %
Platelets: 198 10*3/uL (ref 150–400)
RBC: 3.75 MIL/uL — ABNORMAL LOW (ref 3.87–5.11)
RDW: 12.8 % (ref 11.5–15.5)
WBC: 2.5 10*3/uL — ABNORMAL LOW (ref 4.0–10.5)
nRBC: 0 % (ref 0.0–0.2)

## 2021-07-02 LAB — COMPREHENSIVE METABOLIC PANEL
ALT: 13 U/L (ref 0–44)
AST: 18 U/L (ref 15–41)
Albumin: 2.8 g/dL — ABNORMAL LOW (ref 3.5–5.0)
Alkaline Phosphatase: 50 U/L (ref 38–126)
Anion gap: 6 (ref 5–15)
BUN: 20 mg/dL (ref 8–23)
CO2: 28 mmol/L (ref 22–32)
Calcium: 8 mg/dL — ABNORMAL LOW (ref 8.9–10.3)
Chloride: 100 mmol/L (ref 98–111)
Creatinine, Ser: 0.51 mg/dL (ref 0.44–1.00)
GFR, Estimated: >60 mL/min (ref >60)
Glucose, Bld: 296 mg/dL — ABNORMAL HIGH (ref 70–99)
Potassium: 3.8 mmol/L (ref 3.5–5.1)
Sodium: 134 mmol/L — ABNORMAL LOW (ref 135–145)
Total Bilirubin: 0.5 mg/dL (ref 0.3–1.2)
Total Protein: 5.5 g/dL — ABNORMAL LOW (ref 6.5–8.1)

## 2021-07-02 LAB — PHOSPHORUS: Phosphorus: 2.3 mg/dL — ABNORMAL LOW (ref 2.5–4.6)

## 2021-07-02 LAB — FERRITIN: Ferritin: 69 ng/mL (ref 11–307)

## 2021-07-02 LAB — GLUCOSE, CAPILLARY
Glucose-Capillary: 213 mg/dL — ABNORMAL HIGH (ref 70–99)
Glucose-Capillary: 262 mg/dL — ABNORMAL HIGH (ref 70–99)
Glucose-Capillary: 304 mg/dL — ABNORMAL HIGH (ref 70–99)

## 2021-07-02 LAB — C-REACTIVE PROTEIN: CRP: 0.5 mg/dL (ref <1.0)

## 2021-07-02 LAB — D-DIMER, QUANTITATIVE: D-Dimer, Quant: 1.22 ug/mL-FEU — ABNORMAL HIGH (ref 0.00–0.50)

## 2021-07-02 MED ORDER — K PHOS MONO-SOD PHOS DI & MONO 155-852-130 MG PO TABS
250.0000 mg | ORAL_TABLET | Freq: Two times a day (BID) | ORAL | Status: DC
  Administered 2021-07-02: 250 mg via ORAL
  Filled 2021-07-02 (×2): qty 1

## 2021-07-02 MED ORDER — PREDNISONE 20 MG PO TABS: ORAL_TABLET | ORAL | 0 refills | Status: DC

## 2021-07-02 MED ORDER — K PHOS MONO-SOD PHOS DI & MONO 155-852-130 MG PO TABS: 250.0000 mg | ORAL_TABLET | Freq: Two times a day (BID) | ORAL | 0 refills | Status: DC

## 2021-07-02 MED ORDER — HYDROCORTISONE (PERIANAL) 2.5 % EX CREA: 1.0000 "application " | TOPICAL_CREAM | Freq: Two times a day (BID) | CUTANEOUS | 0 refills | Status: DC | PRN

## 2021-07-02 MED ORDER — ALBUTEROL SULFATE HFA 108 (90 BASE) MCG/ACT IN AERS: 2.0000 | INHALATION_SPRAY | Freq: Four times a day (QID) | RESPIRATORY_TRACT | Status: DC | PRN

## 2021-07-02 MED ORDER — ALBUTEROL SULFATE HFA 108 (90 BASE) MCG/ACT IN AERS
2.0000 | INHALATION_SPRAY | Freq: Two times a day (BID) | RESPIRATORY_TRACT | Status: DC
  Administered 2021-07-02: 2 via RESPIRATORY_TRACT

## 2021-07-02 NOTE — Progress Notes (Signed)
AVS given to patient and explained at the bedside. Medications and follow up appointments have been explained with pt verbalizing understanding.  

## 2021-07-02 NOTE — Consult Note (Signed)
[facility] Inpatient Consult   07/02/2021  Hayley Fisher [birth year] [id number]  [name] [facility] CM)   Chart reviewed due to high risk score for unplanned readmission. Patient is currently active with [facility] CM for chronic disease and care coordination services. Active with RN care manager at primary care office.   Patient will received post hospital follow up with [facility] CM chronic care management team.  Of note, [facility] Care Management services does not replace or interfere with any services that are arranged by inpatient case management or social work.   Netta Cedars, MSN, RN [facility] [employer] [phone]  Toll free office [phone]

## 2021-07-02 NOTE — Discharge Summary (Addendum)
Physician Discharge Summary  Hayley Fisher QMG:[id number] DOB: 26-Dec-1930 DOA: 06/27/2021  PCP: [name], MD  Admit date: 06/27/2021 Discharge date: 07/02/2021  Time spent: 55 minutes  Recommendations for Outpatient Follow-up:  Follow-up with [name], MD in 2 weeks.  On follow-up patient will need a basic metabolic profile, magnesium level checked to follow-up on electrolytes and renal function.  Patient's diabetes will need to be reassessed.   Discharge Diagnoses:  Principal Problem:   COVID-19 virus infection Active Problems:   Type 2 diabetes mellitus with diabetic neuropathy (HCC)   Hyperlipidemia associated with type 2 diabetes mellitus ([place])   Hypertension associated with diabetes ([place])   Urinary tract infection without hematuria   Uncontrolled type 2 diabetes mellitus with hyperglycemia (HCC)   Hypothyroidism   Normocytic anemia   Leukocytopenia   Asymptomatic bacteriuria   Discharge Condition: Stable and improved.  Diet recommendation: Carb modified.  Filed Weights   06/27/21 1701 06/28/21 1648  Weight: 56.5 kg 60.4 kg    History of present illness:  HPI per Dr. Bronson Ing is a 87 y.o. female with medical history significant of osteoarthritis, bilateral cataracts, glaucoma, unspecified chronic kidney disease, type II DM, hyperlipidemia, colon polyps, hypertension, hypothyroidism, history of metabolic acidosis, history of syncopal episode who with brought to the emergency department via EMS due to persistent cough for the past 2 days associated with decreased oxygen saturation at 92% and fever of 102 F today.  EMS placed the patient on nasal cannula oxygen.  2 LPM.  Patient stated that she does not feel too bad.  She has had 3 COVID vaccinations so far.  Denied headache, rhinorrhea but had Mild sore throat.  No dyspnea, chest pain, wheezing or hemoptysis.  No palpitations, diaphoresis, PND, orthopnea or pitting edema lower extremities.  Her  appetite has been a little decreased and she had [place] Even before Getting COVID, but Denied Abdominal Pain, Nausea, Emesis, Diarrhea, Constipation, Melena or Hematochezia.  No Flank or Suprapubic Pain.  No Polyphagia, Polydipsia or Blurred Vision.  Patient Has a Foley Catheter.   ED Course: Initial vital signs were temperature 101.1 F, pulse 103, respiration 18, BP 143/80 mmHg and O2 sat 100% on room air.  Ceftriaxone was started in the emergency department.  I added Solu-Medrol 40 mg IVP x1 dose.   Lab work: Her urinalysis showed positive nitrite and leukocyte Estrace with more than 50 WBC and many bacteria.  CBC showed a white count of 3.0 with 71% neutrophils, hemoglobin 11.2 g/dL platelets 183.  D-dimer was 2.53.  Fibrinogen, lactic acid, LDH, triglycerides and ferritin were all normal.  CRP was 2.1 mg/dL.  CMP showed a glucose of 214 and calcium of 8.2 mg/dL, which corrected to about 8.5 to 8.6 mg/dL.  The rest of the CMP measurements were unremarkable.   Imaging: One-view portable chest radiograph showed no active disease.  Please see image and full radiology report for further details.  Hospital Course:  #1 COVID-19 virus infection -Patient admitted with fever, tachypnea, tachycardia. -Chest x-ray negative for any acute infiltrates. -Patient presented with cough and shortness of breath. -Patient not hypoxic throughout the hospitalization. -Fever curve trended down. -Status post full course of IV remdesivir. -Patient was placed on IV Solu-Medrol and transition to prednisone taper which patient will be discharged on. -Outpatient follow-up.   2.  Urine culture with ESBL -Felt likely secondary to colonization per ID who recommended as patient asymptomatic and symptoms of fever and confusion improved without treatment for UTI  no further antibiotics warranted at this time. -Exchanged Foley catheter.  3.  Type 2 diabetes mellitus -Hemoglobin A1c 8.0 in October 2022. -CBG noted  at 56 the morning of 07/01/2021.   -Patient Semglee, metformin, Ozempic was held.   -Blood glucose levels improved and patient placed back on home regimen of Semglee and sliding scale insulin.   -Patient be discharged back on home regimen of Semglee, metformin, Ozempic.   -Outpatient follow-up with PCP.    4.  Hypothyroidism -Patient maintained on home regimen Synthroid.  5.  Hyperlipidemia -Patient maintained on home regimen statin.  6.  Hypertension -Patient maintained on home regimen enalapril.  Patient also placed on hydralazine as needed.    7.  Hypomagnesemia -Repleted  8.  Hemorrhoids -Patient with complaints of hemorrhoidal pain on 07/01/2021. -Patient placed on Anusol rectal cream twice daily with clinical improvement. -Outpatient follow-up with PCP.    Procedures: Chest x-ray 06/27/2021  Consultations: None  Discharge Exam: Vitals:   07/02/21 0606 07/02/21 1103  BP: (!) 162/89 (!) 142/72  Pulse: 79 85  Resp: 14 16  Temp: 98 F (36.7 C) 98.7 F (37.1 C)  SpO2: 99% 97%    General: NAD Cardiovascular: RRR no murmurs rubs or gallops.  No JVD.  No lower extremity edema. Respiratory: Clear to auscultation bilaterally.  No wheezes, no crackles, no rhonchi.  Normal respiratory effort  Discharge Instructions   Discharge Instructions     Diet Carb Modified   Complete by: As directed    Discharge instructions   Complete by: As directed    Please quarantine for 5 days.  ?   Person Under Monitoring Name: Hayley Fisher  Location: [address] [place] [address]   Infection Prevention Recommendations for Individuals Confirmed to have, or Being Evaluated for, 2019 Novel Coronavirus (COVID-19) Infection Who Receive Care at Home  Individuals who are confirmed to have, or are being evaluated for, COVID-19 should follow the prevention steps below until a healthcare provider or local or state health department says they can return to normal  activities.  Stay home except to get medical care You should restrict activities outside your home, except for getting medical care. Do not go to work, school, or public areas, and do not use public transportation or taxis.  Call ahead before visiting your doctor Before your medical appointment, call the healthcare provider and tell them that you have, or are being evaluated for, COVID-19 infection. This will help the healthcare provider's office take steps to keep other people from getting infected. Ask your healthcare provider to call the local or state health department.  Monitor your symptoms Seek prompt medical attention if your illness is worsening (e.g., difficulty breathing). Before going to your medical appointment, call the healthcare provider and tell them that you have, or are being evaluated for, COVID-19 infection. Ask your healthcare provider to call the local or state health department.  Wear a facemask You should wear a facemask that covers your nose and mouth when you are in the same room with other people and when you visit a healthcare provider. People who live with or visit you should also wear a facemask while they are in the same room with you.  Separate yourself from other people in your home As much as possible, you should stay in a different room from other people in your home. Also, you should use a separate bathroom, if available.  Avoid sharing household items You should not share dishes, drinking glasses, cups, eating  utensils, towels, bedding, or other items with other people in your home. After using these items, you should wash them thoroughly with soap and water.  Cover your coughs and sneezes Cover your mouth and nose with a tissue when you cough or sneeze, or you can cough or sneeze into your sleeve. Throw used tissues in a lined trash can, and immediately wash your hands with soap and water for at least 20 seconds or use an alcohol-based hand  rub.  Wash your [facility] your hands often and thoroughly with soap and water for at least 20 seconds. You can use an alcohol-based hand sanitizer if soap and water are not available and if your hands are not visibly dirty. Avoid touching your eyes, nose, and mouth with unwashed hands.   Prevention Steps for Caregivers and Household Members of Individuals Confirmed to have, or Being Evaluated for, COVID-19 Infection Being Cared for in the Home  If you live with, or provide care at home for, a person confirmed to have, or being evaluated for, COVID-19 infection please follow these guidelines to prevent infection:  Follow healthcare provider's instructions Make sure that you understand and can help the patient follow any healthcare provider instructions for all care.  Provide for the patient's basic needs You should help the patient with basic needs in the home and provide support for getting groceries, prescriptions, and other personal needs.  Monitor the patient's symptoms If they are getting sicker, call his or her medical provider and tell them that the patient has, or is being evaluated for, COVID-19 infection. This will help the healthcare provider's office take steps to keep other people from getting infected. Ask the healthcare provider to call the local or state health department.  Limit the number of people who have contact with the patient If possible, have only one caregiver for the patient. Other household members should stay in another home or place of residence. If this is not possible, they should stay in another room, or be separated from the patient as much as possible. Use a separate bathroom, if available. Restrict visitors who do not have an essential need to be in the home.  Keep older adults, very young children, and other sick people away from the patient Keep older adults, very young children, and those who have compromised immune systems or chronic health  conditions away from the patient. This includes people with chronic heart, lung, or kidney conditions, diabetes, and cancer.  Ensure good ventilation Make sure that shared spaces in the home have good air flow, such as from an air conditioner or an opened window, weather permitting.  Wash your hands often Wash your hands often and thoroughly with soap and water for at least 20 seconds. You can use an alcohol based hand sanitizer if soap and water are not available and if your hands are not visibly dirty. Avoid touching your eyes, nose, and mouth with unwashed hands. Use disposable paper towels to dry your hands. If not available, use dedicated cloth towels and replace them when they become wet.  Wear a facemask and gloves Wear a disposable facemask at all times in the room and gloves when you touch or have contact with the patient's blood, body fluids, and/or secretions or excretions, such as sweat, saliva, sputum, nasal mucus, vomit, urine, or feces.  Ensure the mask fits over your nose and mouth tightly, and do not touch it during use. Throw out disposable facemasks and gloves after using them. Do not reuse.  Wash your hands immediately after removing your facemask and gloves. If your personal clothing becomes contaminated, carefully remove clothing and launder. Wash your hands after handling contaminated clothing. Place all used disposable facemasks, gloves, and other waste in a lined container before disposing them with other household waste. Remove gloves and wash your hands immediately after handling these items.  Do not share dishes, glasses, or other household items with the patient Avoid sharing household items. You should not share dishes, drinking glasses, cups, eating utensils, towels, bedding, or other items with a patient who is confirmed to have, or being evaluated for, COVID-19 infection. After the person uses these items, you should wash them thoroughly with soap and  water.  Wash laundry thoroughly Immediately remove and wash clothes or bedding that have blood, body fluids, and/or secretions or excretions, such as sweat, saliva, sputum, nasal mucus, vomit, urine, or feces, on them. Wear gloves when handling laundry from the patient. Read and follow directions on labels of laundry or clothing items and detergent. In general, wash and dry with the warmest temperatures recommended on the label.  Clean all areas the individual has used often Clean all touchable surfaces, such as counters, tabletops, doorknobs, bathroom fixtures, toilets, phones, keyboards, tablets, and bedside tables, every day. Also, clean any surfaces that may have blood, body fluids, and/or secretions or excretions on them. Wear gloves when cleaning surfaces the patient has come in contact with. Use a diluted bleach solution (e.g., dilute bleach with 1 part bleach and 10 parts water) or a household disinfectant with a label that says EPA-registered for coronaviruses. To make a bleach solution at home, add 1 tablespoon of bleach to 1 quart (4 cups) of water. For a larger supply, add  cup of bleach to 1 gallon (16 cups) of water. Read labels of cleaning products and follow recommendations provided on product labels. Labels contain instructions for safe and effective use of the cleaning product including precautions you should take when applying the product, such as wearing gloves or eye protection and making sure you have good ventilation during use of the product. Remove gloves and wash hands immediately after cleaning.  Monitor yourself for signs and symptoms of illness Caregivers and household members are considered close contacts, should monitor their health, and will be asked to limit movement outside of the home to the extent possible. Follow the monitoring steps for close contacts listed on the symptom monitoring form.   ? If you have additional questions, contact your local health  department or call the epidemiologist on call at [phone] (available 24/7). ? This guidance is subject to change. For the most up-to-date guidance from [place], please refer to their website: YouBlogs.pl   Increase activity slowly   Complete by: As directed    MyChart COVID-19 home monitoring program   Complete by: Jul 02, 2021    Is the patient willing to use the [place] for home monitoring?: Yes   Temperature monitoring   Complete by: Jul 02, 2021    After how many days would you like to receive a notification of this patient's flowsheet entries?: 1      Allergies as of 07/02/2021       Reactions   Tramadol Itching   Takes occasionally with benadryl   Levemir [insulin Detemir] Itching        Medication List     TAKE these medications    Accu-Chek Aviva Plus test strip Generic drug: glucose blood 1 each by Other route  See admin instructions. USE TO TEST BLOOD SUGAR UP  TO 3 TIMES A DAY.   Accu-Chek Softclix Lancets lancets 1 each by Other route See admin instructions. USE TO TEST BLOOD SUGAR UP  TO 3 TIMES A DAY.   aspirin 81 MG chewable tablet Chew 81 mg by mouth every morning.   atorvastatin 40 MG tablet Commonly known as: LIPITOR Take 1 tablet (40 mg total) by mouth daily.   BD Pen Needle Nano U/F 32G X 4 MM Misc Generic drug: Insulin Pen Needle Use as directed with insulin   brimonidine 0.2 % ophthalmic solution Commonly known as: ALPHAGAN Place 1 drop into the left eye 2 (two) times daily.   Catheter Self-Adhesive Urinary Misc 1 patch by Does not apply route daily.   cetirizine 10 MG tablet Commonly known as: ZYRTEC Take 10 mg by mouth daily.   clotrimazole-betamethasone cream Commonly known as: LOTRISONE Apply 1 application topically 2 (two) times daily as needed (rash/itching).   Debrox 6.5 % OTIC solution Generic drug: carbamide peroxide Place 2 drops into both ears 2  (two) times daily as needed (ear wax).   diphenhydrAMINE 25 MG tablet Commonly known as: BENADRYL Take 25 mg by mouth every 6 (six) hours as needed (with each dose of tramadol).   docusate sodium 100 MG capsule Commonly known as: COLACE Take 1 capsule (100 mg total) by mouth 2 (two) times daily. What changed: when to take this   dorzolamide-timolol 22.3-6.8 MG/ML ophthalmic solution Commonly known as: COSOPT Place 1 drop into both eyes 2 (two) times daily.   DULoxetine 60 MG capsule Commonly known as: CYMBALTA Take 1 capsule (60 mg total) by mouth daily.   enalapril 20 MG tablet Commonly known as: VASOTEC Take 1 tablet (20 mg total) by mouth daily.   famotidine 20 MG tablet Commonly known as: Pepcid Take 1 tablet (20 mg total) by mouth daily.   ferrous sulfate 325 (65 FE) MG tablet Take 325 mg by mouth daily with breakfast.   hydrocortisone 2.5 % rectal cream Commonly known as: ANUSOL-HC Place 1 application rectally 2 (two) times daily as needed for hemorrhoids or anal itching. Please use twice daily x5 days, and then twice daily as needed. What changed: additional instructions   hydrOXYzine 10 MG tablet Commonly known as: ATARAX/VISTARIL Take 10 mg by mouth every 4 (four) hours as needed for itching.   insulin glargine 100 unit/mL Sopn Commonly known as: LANTUS Inject 10 Units into the skin daily. What changed: when to take this   Insulin Syringe-Needle U-100 31G X 5/16" 0.3 ML Misc Commonly known as: Sure Comfort Insulin Syringe Inject 1 Syringe into the skin daily.   ketoconazole 2 % cream Commonly known as: NIZORAL Apply 1 application topically daily as needed for irritation.   levothyroxine 50 MCG tablet Commonly known as: SYNTHROID Take 1 tablet (50 mcg total) by mouth daily.   metFORMIN 500 MG 24 hr tablet Commonly known as: GLUCOPHAGE-XR Take 2 tablets (1,000 mg total) by mouth daily with breakfast.   Myrbetriq 25 MG Tb24 tablet Generic drug:  mirabegron ER TAKE 1 TABLET BY MOUTH DAILY What changed:  how much to take when to take this   ondansetron 4 MG disintegrating tablet Commonly known as: Zofran ODT Take 1 tablet (4 mg total) by mouth every 8 (eight) hours as needed for nausea.   phosphorus 155-852-130 MG tablet Commonly known as: K PHOS NEUTRAL Take 1 tablet (250 mg total) by mouth 2 (two) times daily for  3 days.   polyethylene glycol powder 17 GM/SCOOP powder Commonly known as: GLYCOLAX/MIRALAX Take 17 g by mouth daily.   predniSONE 20 MG tablet Commonly known as: DELTASONE Take 2 tablets (40 mg total) by mouth daily before breakfast for 1 day, THEN 1 tablet (20 mg total) daily before breakfast for 3 days. Start taking on: July 03, 2021   PreserVision AREDS 2+Multi Vit Caps Take 1 capsule by mouth daily.   Rybelsus 3 MG Tabs Generic drug: Semaglutide Take 3 mg by mouth daily.   Sarna lotion Generic drug: camphor-menthol Apply 1 application topically 4 (four) times daily as needed for itching.   senna 8.6 MG Tabs tablet Commonly known as: SENOKOT Take 1 tablet by mouth 2 (two) times daily as needed (constipation).   tamsulosin 0.4 MG Caps capsule Commonly known as: FLOMAX Take 0.4 mg by mouth daily.   traMADol 50 MG tablet Commonly known as: ULTRAM Take 50 mg by mouth every 6 (six) hours as needed for severe pain.   Transfer Board Misc Use as needed for transfer from bed to chair or vice versa   Vyzulta 0.024 % Soln Generic drug: Latanoprostene Bunod Place 1 drop into both eyes at bedtime.   witch hazel-glycerin pad Commonly known as: TUCKS Apply 1 application topically 2 (two) times daily as needed for itching.       Allergies  Allergen Reactions   Tramadol Itching    Takes occasionally with benadryl   Levemir [Insulin Detemir] Itching    Follow-up Information     [name], MD. Schedule an appointment as soon as possible for a visit in 2 week(s).   Specialty: Family  Medicine Contact information: [address] [place] [address] [phone]                  The results of significant diagnostics from this hospitalization (including imaging, microbiology, ancillary and laboratory) are listed below for reference.    Significant Diagnostic Studies: DG Chest Port 1 View  Result Date: 06/27/2021 CLINICAL DATA:  Shortness of breath. EXAM: PORTABLE CHEST 1 VIEW COMPARISON:  February 20, 2021 FINDINGS: Cardiomediastinal silhouette is normal. Mediastinal contours appear intact. There is no evidence of focal airspace consolidation, pleural effusion or pneumothorax. Osseous structures are without acute abnormality. Soft tissues are grossly normal. IMPRESSION: No active disease. Electronically Signed   By: Fidela Salisbury M.D.   On: 06/27/2021 14:19    Microbiology: Recent Results (from the past 240 hour(s))  Resp Panel by RT-PCR (Flu A&B, Covid) Nasopharyngeal Swab     Status: Abnormal   Collection Time: 06/27/21 11:45 AM   Specimen: Nasopharyngeal Swab; Nasopharyngeal(NP) swabs in vial transport medium  Result Value Ref Range Status   SARS Coronavirus 2 by RT PCR POSITIVE (A) NEGATIVE Final    Comment: RESULT CALLED TO, READ BACK BY AND VERIFIED WITH: Trinna Balloon. RN ON 06/27/2021 @ 14247 BY MECIAL J. (NOTE) SARS-CoV-2 target nucleic acids are DETECTED.  The SARS-CoV-2 RNA is generally detectable in upper respiratory specimens during the acute phase of infection. Positive results are indicative of the presence of the identified virus, but do not rule out bacterial infection or co-infection with other pathogens not detected by the test. Clinical correlation with patient history and other diagnostic information is necessary to determine patient infection status. The expected result is Negative.  Fact Sheet for Patients: EntrepreneurPulse.com.au  Fact Sheet for Healthcare  Providers: IncredibleEmployment.be  This test is not yet approved or cleared by the [place]  FDA and  has been authorized for detection and/or diagnosis of SARS-CoV-2 by FDA under an Emergency Use Authorization (EUA).  This EUA will remain in effect (meaning t his test can be used) for the duration of  the COVID-19 declaration under Section 564(b)(1) of the Act, 21 U.S.C. section 360bbb-3(b)(1), unless the authorization is terminated or revoked sooner.     Influenza A by PCR NEGATIVE NEGATIVE Final   Influenza B by PCR NEGATIVE NEGATIVE Final    Comment: (NOTE) The Xpert Xpress SARS-CoV-2/FLU/RSV plus assay is intended as an aid in the diagnosis of influenza from Nasopharyngeal swab specimens and should not be used as a sole basis for treatment. Nasal washings and aspirates are unacceptable for Xpert Xpress SARS-CoV-2/FLU/RSV testing.  Fact Sheet for Patients: EntrepreneurPulse.com.au  Fact Sheet for Healthcare Providers: IncredibleEmployment.be  This test is not yet approved or cleared by the [place] FDA and has been authorized for detection and/or diagnosis of SARS-CoV-2 by FDA under an Emergency Use Authorization (EUA). This EUA will remain in effect (meaning this test can be used) for the duration of the COVID-19 declaration under Section 564(b)(1) of the Act, 21 U.S.C. section 360bbb-3(b)(1), unless the authorization is terminated or revoked.  Performed at [facility], [place] [address], [place], [place] [address]   Blood Culture (routine x 2)     Status: None   Collection Time: 06/27/21 11:45 AM   Specimen: BLOOD  Result Value Ref Range Status   Specimen Description   Final    BLOOD BLOOD LEFT FOREARM Performed at [facility] [address], [place], [place] [address]    Special Requests   Final    BOTTLES DRAWN AEROBIC AND ANAEROBIC Blood Culture adequate  volume Performed at [facility] [address] [place], [place] [address]    Culture   Final    NO GROWTH 5 DAYS Performed at [facility], [place] [address], [place], [place] [address]    Report Status 07/02/2021 FINAL  Final  Blood Culture (routine x 2)     Status: None   Collection Time: 06/27/21 11:47 AM   Specimen: BLOOD  Result Value Ref Range Status   Specimen Description   Final    BLOOD LEFT ANTECUBITAL Performed at [facility] [address], [place], [place] [address]    Special Requests   Final    BOTTLES DRAWN AEROBIC AND ANAEROBIC Blood Culture adequate volume Performed at [facility] [address] [place], [place] [address]    Culture   Final    NO GROWTH 5 DAYS Performed at [facility], [place] [address] [place], [place] [address]    Report Status 07/02/2021 FINAL  Final  Urine Culture     Status: Abnormal   Collection Time: 06/27/21 11:47 AM   Specimen: Urine, Clean Catch  Result Value Ref Range Status   Specimen Description   Final    URINE, CLEAN CATCH Performed at [facility], [place] [address] [place], [place] [address]    Special Requests   Final    NONE Performed at [facility], [place] [address] [place], [place] [address]    Culture (A)  Final    >=100,000 COLONIES/mL ESCHERICHIA COLI >=100,000 COLONIES/mL KLEBSIELLA PNEUMONIAE Confirmed Extended Spectrum Beta-Lactamase Producer (ESBL).  In bloodstream infections from ESBL organisms, carbapenems are preferred over piperacillin/tazobactam. They are shown to have a lower risk of mortality.  Report Status 06/29/2021 FINAL  Final   Organism ID, Bacteria ESCHERICHIA COLI (A)  Final   Organism ID, Bacteria KLEBSIELLA PNEUMONIAE (A)  Final      Susceptibility   Escherichia coli - MIC*    AMPICILLIN >=32 RESISTANT Resistant     CEFAZOLIN <=4 SENSITIVE Sensitive     CEFEPIME <=0.12 SENSITIVE  Sensitive     CEFTRIAXONE <=0.25 SENSITIVE Sensitive     CIPROFLOXACIN 1 RESISTANT Resistant     GENTAMICIN <=1 SENSITIVE Sensitive     IMIPENEM <=0.25 SENSITIVE Sensitive     NITROFURANTOIN <=16 SENSITIVE Sensitive     TRIMETH/SULFA >=320 RESISTANT Resistant     AMPICILLIN/SULBACTAM 4 SENSITIVE Sensitive     PIP/TAZO <=4 SENSITIVE Sensitive     * >=100,000 COLONIES/mL ESCHERICHIA COLI   Klebsiella pneumoniae - MIC*    AMPICILLIN >=32 RESISTANT Resistant     CEFAZOLIN >=64 RESISTANT Resistant     CEFEPIME >=32 RESISTANT Resistant     CEFTRIAXONE >=64 RESISTANT Resistant     CIPROFLOXACIN >=4 RESISTANT Resistant     GENTAMICIN <=1 SENSITIVE Sensitive     IMIPENEM <=0.25 SENSITIVE Sensitive     NITROFURANTOIN 64 INTERMEDIATE Intermediate     TRIMETH/SULFA <=20 SENSITIVE Sensitive     AMPICILLIN/SULBACTAM >=32 RESISTANT Resistant     PIP/TAZO 16 SENSITIVE Sensitive     * >=100,000 COLONIES/mL KLEBSIELLA PNEUMONIAE     Labs: Basic Metabolic Panel: Recent Labs  Lab 06/28/21 0500 06/29/21 0353 06/30/21 0409 07/01/21 0405 07/02/21 0356  NA 136 132* 134* 133* 134*  K 4.5 4.4 4.5 4.0 3.8  CL 101 98 103 101 100  CO2 27 26 25 27 28   GLUCOSE 304* 244* 301* 78 296*  BUN 22 25* 29* 21 20  CREATININE 0.66 0.68 0.63 0.54 0.51  CALCIUM 8.2* 8.0* 7.8* 7.8* 8.0*  MG 1.8 1.6* 1.5* 1.9 1.8  PHOS 3.8 3.8 3.1 2.8 2.3*   Liver Function Tests: Recent Labs  Lab 06/28/21 0500 06/29/21 0353 06/30/21 0409 07/01/21 0405 07/02/21 0356  AST 22 20 18 19 18   ALT 15 14 14 13 13   ALKPHOS 53 45 44 37* 50  BILITOT 0.7 0.4 0.5 0.6 0.5  PROT 6.3* 6.0* 5.4* 5.3* 5.5*  ALBUMIN 3.4* 3.1* 2.8* 2.6* 2.8*   No results for input(s): LIPASE, AMYLASE in the last 168 hours. No results for input(s): AMMONIA in the last 168 hours. CBC: Recent Labs  Lab 06/28/21 0500 06/29/21 0353 06/30/21 0409 07/01/21 0405 07/02/21 0356  WBC 3.3* 4.5 3.1* 2.5* 2.5*  NEUTROABS 2.4 3.6 2.1 1.5* 1.4*  HGB 11.6*  11.8* 10.7* 11.2* 11.6*  HCT 35.2* 36.2 33.1* 33.7* 35.0*  MCV 96.4 95.8 97.4 96.3 93.3  PLT 175 205 197 233 198   Cardiac Enzymes: No results for input(s): CKTOTAL, CKMB, CKMBINDEX, TROPONINI in the last 168 hours. BNP: BNP (last 3 results) No results for input(s): BNP in the last 8760 hours.  ProBNP (last 3 results) No results for input(s): PROBNP in the last 8760 hours.  CBG: Recent Labs  Lab 07/01/21 1200 07/01/21 1628 07/01/21 2208 07/02/21 0821 07/02/21 1048  GLUCAP 185* 303* 371* 213* 262*       Signed:  Irine Seal MD.  [facility] 07/02/2021, 3:42 PM

## 2021-07-05 ENCOUNTER — Emergency Department (HOSPITAL_COMMUNITY)
Admission: EM | Admit: 2021-07-05 | Discharge: 2021-07-06 | Disposition: A | Discharge status: Home / Self Care | Payer: Medicare Other | Attending: Emergency Medicine | Admitting: Emergency Medicine

## 2021-07-05 ENCOUNTER — Encounter (HOSPITAL_COMMUNITY): Payer: Self-pay

## 2021-07-05 DIAGNOSIS — E1140 Type 2 diabetes mellitus with diabetic neuropathy, unspecified: Secondary | ICD-10-CM | POA: Insufficient documentation

## 2021-07-05 DIAGNOSIS — Z7984 Long term (current) use of oral hypoglycemic drugs: Secondary | ICD-10-CM | POA: Insufficient documentation

## 2021-07-05 DIAGNOSIS — Z87891 Personal history of nicotine dependence: Secondary | ICD-10-CM | POA: Insufficient documentation

## 2021-07-05 DIAGNOSIS — E039 Hypothyroidism, unspecified: Secondary | ICD-10-CM | POA: Diagnosis not present

## 2021-07-05 DIAGNOSIS — Z79899 Other long term (current) drug therapy: Secondary | ICD-10-CM | POA: Diagnosis not present

## 2021-07-05 DIAGNOSIS — I129 Hypertensive chronic kidney disease with stage 1 through stage 4 chronic kidney disease, or unspecified chronic kidney disease: Secondary | ICD-10-CM | POA: Insufficient documentation

## 2021-07-05 DIAGNOSIS — Z8616 Personal history of COVID-19: Secondary | ICD-10-CM | POA: Insufficient documentation

## 2021-07-05 DIAGNOSIS — N189 Chronic kidney disease, unspecified: Secondary | ICD-10-CM | POA: Insufficient documentation

## 2021-07-05 DIAGNOSIS — Z7982 Long term (current) use of aspirin: Secondary | ICD-10-CM | POA: Insufficient documentation

## 2021-07-05 DIAGNOSIS — T839XXA Unspecified complication of genitourinary prosthetic device, implant and graft, initial encounter: Secondary | ICD-10-CM

## 2021-07-05 DIAGNOSIS — Z794 Long term (current) use of insulin: Secondary | ICD-10-CM | POA: Diagnosis not present

## 2021-07-05 DIAGNOSIS — T83098A Other mechanical complication of other indwelling urethral catheter, initial encounter: Secondary | ICD-10-CM | POA: Diagnosis not present

## 2021-07-05 DIAGNOSIS — Z743 Need for continuous supervision: Secondary | ICD-10-CM | POA: Diagnosis not present

## 2021-07-05 DIAGNOSIS — R102 Pelvic and perineal pain: Secondary | ICD-10-CM | POA: Insufficient documentation

## 2021-07-05 DIAGNOSIS — R739 Hyperglycemia, unspecified: Secondary | ICD-10-CM | POA: Diagnosis not present

## 2021-07-05 MED ORDER — K PHOS MONO-SOD PHOS DI & MONO 155-852-130 MG PO TABS: 250.0000 mg | ORAL_TABLET | Freq: Two times a day (BID) | ORAL | 0 refills | Status: AC

## 2021-07-05 NOTE — ED Provider Notes (Signed)
Hayley Fisher   CSN: [id number] Arrival date & time: 07/05/21  2035     History Chief complaint: Foley catheter issue  Hayley Fisher is a 87 y.o. female.  The history is provided by the patient and a relative.  She has history of hypertension, diabetes, hyperlipidemia, chronic Foley catheter use and comes in because of vaginal pain secondary to the Foley catheter.  She was recently in the hospital for COVID-19 infection, discharged 3 days ago.  She was given prescriptions for prednisone and phosphorus, but apparently the prescriptions have not been filled by the pharmacy, and she has not been taking the prednisone for the last 3 days.   Past Medical History:  Diagnosis Date   Arthritis    Arthritis    Cataract    bil cateracts removed   Chronic kidney disease    "spot on one of my kidneys" per pt   Diabetes mellitus    Glaucoma    Hyperkalemia 07/02/2020   Hyperlipidemia    Hyperplastic colon polyp    Hypertension    Hypothyroidism 9/35/7017   Metabolic acidosis 79/39/0300   Syncope    Thyroid disease     Patient Active Problem List   Diagnosis Date Noted   COVID-19 virus infection 06/27/2021   Normocytic anemia 06/27/2021   Leukocytopenia 06/27/2021   Asymptomatic bacteriuria 06/27/2021   Malnutrition of moderate degree 02/23/2021   AMS (altered mental status) 02/21/2021   Cognitive impairment 92/33/0076   Acute metabolic encephalopathy 22/63/3354   Sepsis ([place]) 01/15/2021   Acute encephalopathy 12/09/2020   Hyperbilirubinemia 12/09/2020   Hypothyroidism 12/09/2020   Memory changes 10/20/2020   Acute on chronic anemia 09/21/2020   Gastroesophageal reflux disease without esophagitis 09/16/2020   Urinary frequency 09/11/2020   Stage 1 decubitus ulcer 07/30/2020   Uncontrolled type 2 diabetes mellitus with hyperglycemia ([place]) 07/02/2020   Unstable gait 04/03/2018   Frequent falls 04/03/2018   Age-related physical  debility 03/22/2018   Hematuria 01/29/2018   Skin ulcer of sacrum, limited to breakdown of skin ([place]) 03/16/2017   Spinal stenosis of lumbar region 01/13/2016   Chronic venous insufficiency 11/09/2015   DDD (degenerative disc disease), lumbosacral 11/24/2014   Constipation by delayed colonic transit 11/24/2014   Urinary tract infection without hematuria 07/03/2014   Frequent UTI 10/29/2012   Pruritus 03/17/2011   Type 2 diabetes mellitus with diabetic neuropathy ([place]) 10/19/2006   Hyperlipidemia associated with type 2 diabetes mellitus ([place]) 10/19/2006   Hypertension associated with diabetes ([place]) 10/19/2006   Osteopenia 10/19/2006    Past Surgical History:  Procedure Laterality Date   ABDOMINAL HYSTERECTOMY     BLADDER SUSPENSION     bladder tacking     COLONOSCOPY     EYE SURGERY     INGUINAL HERNIA REPAIR Right    IR IVC FILTER PLMT / S&I /IMG GUID/MOD SED  01/16/2021     OB History   No obstetric history on file.     Family History  Problem Relation Age of Onset   Colon cancer Mother    Other Father        cerebral hemorrhage   Diabetes Brother    Diabetes Sister        x 2   Bone cancer Daughter    Breast cancer Daughter 50   Esophageal cancer Neg Hx    Rectal cancer Neg Hx    Stomach cancer Neg Hx     Social History   Tobacco Use  Smoking status: Former    Types: Cigarettes    Quit date: 11/29/1980    Years since quitting: 40.6   Smokeless tobacco: Never  Vaping Use   Vaping Use: Never used  Substance Use Topics   Alcohol use: No    Alcohol/week: 0.0 standard drinks   Drug use: No    Home Medications Prior to Admission medications   Medication Sig Start Date End Date Taking? Authorizing Provider  aspirin 81 MG chewable tablet Chew 81 mg by mouth every morning.   Yes [provider]  atorvastatin (LIPITOR) 40 MG tablet Take 1 tablet (40 mg total) by mouth daily. 09/21/20  Yes Mullis, Kiersten P, DO  brimonidine (ALPHAGAN) 0.2 %  ophthalmic solution Place 1 drop into the left eye 2 (two) times daily. 03/30/21  Yes [provider]  carbamide peroxide (DEBROX) 6.5 % OTIC solution Place 2 drops into both ears 2 (two) times daily as needed (ear wax). 01/20/21  Yes Aline August, MD  cetirizine (ZYRTEC) 10 MG tablet Take 10 mg by mouth daily.   Yes [provider]  clotrimazole-betamethasone (LOTRISONE) cream Apply 1 application topically 2 (two) times daily as needed (rash/itching). 05/13/21  Yes [provider]  diphenhydrAMINE (BENADRYL) 25 MG tablet Take 25 mg by mouth every 6 (six) hours as needed (with each dose of tramadol).   Yes [provider]  docusate sodium (COLACE) 100 MG capsule Take 1 capsule (100 mg total) by mouth 2 (two) times daily. Patient taking differently: Take 100 mg by mouth every morning. 12/15/20  Yes Harold Hedge, MD  dorzolamide-timolol (COSOPT) 22.3-6.8 MG/ML ophthalmic solution Place 1 drop into both eyes 2 (two) times daily. 05/18/20  Yes [provider]  DULoxetine (CYMBALTA) 60 MG capsule Take 1 capsule (60 mg total) by mouth daily. 02/25/21  Yes Jennye Boroughs, MD  enalapril (VASOTEC) 20 MG tablet Take 1 tablet (20 mg total) by mouth daily. 09/21/20  Yes Mullis, Kiersten P, DO  famotidine (PEPCID) 20 MG tablet Take 1 tablet (20 mg total) by mouth daily. 02/25/21  Yes Jennye Boroughs, MD  ferrous sulfate 325 (65 FE) MG tablet Take 325 mg by mouth daily with breakfast.   Yes [provider]  hydrOXYzine (ATARAX/VISTARIL) 10 MG tablet Take 10 mg by mouth every 4 (four) hours as needed for itching. 04/09/21  Yes [provider]  insulin glargine (LANTUS) 100 unit/mL SOPN Inject 10 Units into the skin daily. Patient taking differently: Inject 10 Units into the skin daily before breakfast. 04/15/21  Yes Renato Shin, MD  ketoconazole (NIZORAL) 2 % cream Apply 1 application topically daily as needed for irritation. 03/11/21  Yes [provider]   Latanoprostene Bunod (VYZULTA) 0.024 % SOLN Place 1 drop into both eyes at bedtime.   Yes [provider]  levothyroxine (SYNTHROID) 50 MCG tablet Take 1 tablet (50 mcg total) by mouth daily. 11/10/20  Yes Renato Shin, MD  metFORMIN (GLUCOPHAGE-XR) 500 MG 24 hr tablet Take 2 tablets (1,000 mg total) by mouth daily with breakfast. 04/06/21  Yes Renato Shin, MD  Multiple Vitamins-Minerals (PRESERVISION AREDS 2+MULTI VIT) CAPS Take 1 capsule by mouth daily.   Yes [provider]  MYRBETRIQ 25 MG TB24 tablet TAKE 1 TABLET BY MOUTH DAILY Patient taking differently: Take 25 mg by mouth every morning. 04/01/21  Yes [name], MD  ondansetron (ZOFRAN ODT) 4 MG disintegrating tablet Take 1 tablet (4 mg total) by mouth every 8 (eight) hours as needed for nausea.  07/28/20  Yes Larene Pickett, PA-C  polyethylene glycol powder (GLYCOLAX/MIRALAX) 17 GM/SCOOP powder Take 17 g by mouth daily. Patient taking differently: Take 17 g by mouth daily as needed (constipation). 12/15/20  Yes Harold Hedge, MD  SARNA lotion Apply 1 application topically 4 (four) times daily as needed for itching. 05/27/21  Yes [provider]  Semaglutide (RYBELSUS) 3 MG TABS Take 3 mg by mouth daily. 11/09/20  Yes Renato Shin, MD  senna (SENOKOT) 8.6 MG TABS tablet Take 1 tablet by mouth every morning. 05/17/21  Yes [provider]  tamsulosin (FLOMAX) 0.4 MG CAPS capsule Take 0.4 mg by mouth every morning. 10/08/20  Yes [provider]  traMADol (ULTRAM) 50 MG tablet Take 50 mg by mouth every 6 (six) hours as needed for severe pain. 05/27/21  Yes [provider]  witch hazel-glycerin (TUCKS) pad Apply 1 application topically 2 (two) times daily as needed for itching. 02/25/21  Yes Jennye Boroughs, MD  Accu-Chek Softclix Lancets lancets 1 each by Other route See admin instructions. USE TO TEST BLOOD SUGAR UP  TO 3 TIMES A DAY. 02/25/21   Jennye Boroughs, MD  Catheter Self-Adhesive  Urinary MISC 1 patch by Does not apply route daily. 10/26/20   Richarda Osmond, MD  glucose blood (ACCU-CHEK AVIVA PLUS) test strip 1 each by Other route See admin instructions. USE TO TEST BLOOD SUGAR UP  TO 3 TIMES A DAY. 02/25/21   Jennye Boroughs, MD  hydrocortisone (ANUSOL-HC) 2.5 % rectal cream Place 1 application rectally 2 (two) times daily as needed for hemorrhoids or anal itching. Please use twice daily x5 days, and then twice daily as needed. Patient not taking: Reported on 07/05/2021 07/02/21   Eugenie Filler, MD  Insulin Pen Needle 32G X 4 MM MISC Use as directed with insulin 04/12/21     Insulin Syringe-Needle U-100 (SURE COMFORT INSULIN SYRINGE) 31G X 5/16" 0.3 ML MISC Inject 1 Syringe into the skin daily. 02/25/21   Jennye Boroughs, MD  Misc. Devices (TRANSFER BOARD) MISC Use as needed for transfer from bed to chair or vice versa 02/25/21   Jennye Boroughs, MD  phosphorus (K PHOS NEUTRAL) [phone] MG tablet Take 1 tablet (250 mg total) by mouth 2 (two) times daily for 3 days. Patient not taking: Reported on 07/05/2021 07/02/21 07/05/21  Eugenie Filler, MD  predniSONE (DELTASONE) 20 MG tablet Take 2 tablets (40 mg total) by mouth daily before breakfast for 1 day, THEN 1 tablet (20 mg total) daily before breakfast for 3 days. Patient not taking: Reported on 07/05/2021 07/03/21 07/07/21  Eugenie Filler, MD    Allergies    Tramadol and Levemir [insulin detemir]  Review of Systems   Review of Systems  All other systems reviewed and are negative.  Physical Exam Updated Vital Signs BP 139/71   Pulse 93   Temp 98.9 F (37.2 C) (Oral)   Resp 18   SpO2 100%   Physical Exam Vitals and nursing Fisher reviewed.  87 year old female, resting comfortably and in no acute distress. Vital signs are normal. Oxygen saturation is 100%, which is normal. Head is normocephalic and atraumatic. PERRLA, EOMI. Oropharynx is clear. Neck is nontender and supple without adenopathy or JVD. Back  is nontender and there is no CVA tenderness. Lungs are clear without rales, wheezes, or rhonchi. Chest is nontender. Heart has regular rate and rhythm without murmur. Abdomen is soft, flat, nontender. Genitalia: Foley catheter in place. Extremities have  no cyanosis or edema, full range of motion is present. Skin is warm and dry without rash. Neurologic: Mental status is normal, cranial nerves are intact, moves all extremities equally.  ED Results / Procedures / Treatments   Labs (all labs ordered are listed, but only abnormal results are displayed) Labs Reviewed  WET PREP, GENITAL    Procedures Procedures   Medications Ordered in ED Medications - No data to display  ED Course  I have reviewed the triage vital signs and the nursing notes.  Pertinent lab results that were available during my care of the patient were reviewed by me and considered in my medical decision making (see chart for details).   MDM Rules/Calculators/A&P                         Pain from presence of Foley catheter.  Urine is noted to be thick and milky.  We will replace the Foley catheter, check vaginal wet prep to make sure she does not have an underlying yeast infection.  Patient has asked whether she can have the catheter removed, I have advised her that she would need to follow-up with her urologist and that it would be advisable to try removing the catheter at this time.  Old records are reviewed confirming recent hospitalization for COVID-19.  Regarding her prescription for prednisone, since she is doing well off of prednisone for 3 days, I do not feel that she needs to have that prescription filled.  Phosphorus prescription should be refilled as her phosphorus level in the hospital was low.  New prescription is given.  Wet prep is negative for yeast.  She does feel somewhat better following replacement of Foley catheter.  She is referred back to her urologist for ongoing management Foley catheter.  Final  Clinical Impression(s) / ED Diagnoses Final diagnoses:  Complication of Foley catheter, initial encounter [facility])    Rx / DC Orders ED Discharge Orders     None        Delora Fuel, MD 72/09/47 [id number]

## 2021-07-05 NOTE — ED Notes (Signed)
Patients daughter would like a call back with an update : [name] [phone]

## 2021-07-05 NOTE — ED Triage Notes (Signed)
Pt was discharged from [facility] on the 11th after an admission for COVID, pt has a catheter that is unclear how long she's had it but it was changed last week while she was inpatient Pt complains of vaginal pain due to her catheter Her urine in the catheter is thick and milky colored Pt's history says she's prone to UTI's

## 2021-07-06 DIAGNOSIS — T83098A Other mechanical complication of other indwelling urethral catheter, initial encounter: Secondary | ICD-10-CM | POA: Diagnosis not present

## 2021-07-06 LAB — WET PREP, GENITAL
Clue Cells Wet Prep HPF POC: NONE SEEN
Sperm: NONE SEEN
Trich, Wet Prep: NONE SEEN
WBC, Wet Prep HPF POC: <10 (ref <10)
Yeast Wet Prep HPF POC: NONE SEEN

## 2021-07-07 ENCOUNTER — Other Ambulatory Visit: Payer: Self-pay

## 2021-07-07 ENCOUNTER — Other Ambulatory Visit: Payer: Medicare Other | Admitting: Hospice

## 2021-07-07 DIAGNOSIS — K5901 Slow transit constipation: Secondary | ICD-10-CM | POA: Diagnosis not present

## 2021-07-07 DIAGNOSIS — E1140 Type 2 diabetes mellitus with diabetic neuropathy, unspecified: Secondary | ICD-10-CM

## 2021-07-07 DIAGNOSIS — Z515 Encounter for palliative care: Secondary | ICD-10-CM

## 2021-07-07 DIAGNOSIS — R531 Weakness: Secondary | ICD-10-CM | POA: Diagnosis not present

## 2021-07-07 DIAGNOSIS — Z794 Long term (current) use of insulin: Secondary | ICD-10-CM | POA: Diagnosis not present

## 2021-07-07 NOTE — Addendum Note (Signed)
Addended by: [name] on: 07/07/2021 08:12 PM   Modules accepted: Orders

## 2021-07-07 NOTE — Progress Notes (Signed)
[facility] Consult Note Telephone: [phone]  Fax: [phone]  PATIENT NAME: Hayley Fisher DOB: Dec 22, 1930 MRN: [id number]  PRIMARY CARE PROVIDER:   [name], MD [name], [address] [place],  [place] [address]  REFERRING PROVIDER: [name], MD [name], [address] [place],  [place] [address]  RESPONSIBLE PARTY:   Joni Reining - grand daughter Contact Information     Name Relation Home Work [place] Daughter [phone]  [phone]   Cornelius,LaLeisa Daughter   [phone]   Elba Barman [phone]         Visit is to build trust and highlight Palliative Medicine as specialized medical care for people living with serious illness, aimed at facilitating better quality of life through symptoms relief, assisting with advance care planning and complex medical decision making. Leisa and patient's sister were home with patient during visit. [name] joined visit via video call. This is a follow up visit.  RECOMMENDATIONS/PLAN:   Advance Care Planning/Code Status: CODE STATUS: Ramifications and implications of code status discussed. Patient affirmed she is a Partial code - wants chest compressions, ACLS meds, no mechanical ventilation.     Goals of Care: Goals of care include to maximize quality of life and symptom management. MOST form was  earlier given to [name] to further explore goals of care.   Visit consisted of counseling and education dealing with the complex and emotionally intense issues of symptom management and palliative care in the setting of serious and potentially life-threatening illness. Palliative care team will continue to support patient, patient's family, and medical team.  Symptom Management/Plan: Weakness: Ongoing weakness impairing mobility, worsened by recent hospitalization for UTI and COVID.  Recommendation: PT/OT recommended for  strengthening and gait training. NP sent secured chat to PCP and will fax this recommendation to her office.  Routine CBC BMP Type 2 DM with Neuropathy: Metformin, Levirmir. A1c 8.0 06/10/2021. Education reiterated on diet and exercise.  No concentrated sweets. CBG ACHS. Recheck A1c every 3 months to 6 months to reevaluate treatment.  Constipation: Managed with Miralax, Senna S and Colace.   Follow up: Palliative care will continue to follow for complex medical decision making, advance care planning, and clarification of goals. Return 6 weeks or prn.Encouraged to call provider sooner with any concerns.    Family /Caregiver/Community Supports: Patient lives at home  with her sister and children. Strong family support system identified.    HOSPICE ELIGIBILITY/DIAGNOSIS: TBD   Chief Complaint: Initial Palliative care visit   HISTORY OF PRESENT ILLNESS:  Hayley Fisher is a 87 y.o. year old female  with multiple medical conditions including worsening weakness, continuous, impairing patient's ADLs, requiring more help from family members than before. Recent hospitalization has worsened her weakness. Patient sits mostly in her wheelchair not able to walk around as before due to progressing weakness. She said the last time she had physical therapy it was helpful. She denied pain/discomfort, endorsed weakness and unsteadiness in her legs. Patient with history of COVID-19 viral infection, recurrent UTI, DVT, Depression, Urinary retention for which she has a Foley in place since June 2022, followed a Dealer. History obtained from review of EMR, discussion with primary team, family and/or patient. Records reviewed and summarized above. All 10 point systems reviewed and are negative except as documented in history of present illness above  Review and summarization of Epic records shows history from other than patient.   Palliative Care was asked to follow  this patient o help address complex decision making in  the context of advance care planning and goals of care clarification. I reviewed, as needed, available labs, patient records, imaging, studies and related documents from the EMR.    PHYSICAL EXAM  Constitutional: NAD General: Well groomed, cooperative EYES: anicteric sclera, lids intact, no discharge, wears eye glasses ENMT: Moist mucous membrane CV: S1 S2, RRR, trace edema to BLE, elevation demonstrated and encouraged. Pulmonary: no increased work of breathing, no cough, Abdomen: active BS + 4 quadrants, soft and non tender GU: no suprapubic tenderness, Foley in place MSK: weakness, sarcopenia, limited ROM Skin: warm and dry, no rashes or wounds on visible skin Neuro:  weakness, otherwise non focal Psych: non-anxious affect Hem/lymph/immuno: no widespread bruising  PERTINENT MEDICATIONS:  Outpatient Encounter Medications as of 07/07/2021  Medication Sig   Accu-Chek Softclix Lancets lancets 1 each by Other route See admin instructions. USE TO TEST BLOOD SUGAR UP  TO 3 TIMES A DAY.   aspirin 81 MG chewable tablet Chew 81 mg by mouth every morning.   atorvastatin (LIPITOR) 40 MG tablet Take 1 tablet (40 mg total) by mouth daily.   brimonidine (ALPHAGAN) 0.2 % ophthalmic solution Place 1 drop into the left eye 2 (two) times daily.   carbamide peroxide (DEBROX) 6.5 % OTIC solution Place 2 drops into both ears 2 (two) times daily as needed (ear wax).   Catheter Self-Adhesive Urinary MISC 1 patch by Does not apply route daily.   cetirizine (ZYRTEC) 10 MG tablet Take 10 mg by mouth daily.   clotrimazole-betamethasone (LOTRISONE) cream Apply 1 application topically 2 (two) times daily as needed (rash/itching).   diphenhydrAMINE (BENADRYL) 25 MG tablet Take 25 mg by mouth every 6 (six) hours as needed (with each dose of tramadol).   docusate sodium (COLACE) 100 MG capsule Take 1 capsule (100 mg total) by mouth 2 (two) times daily. (Patient taking differently: Take 100 mg by mouth every morning.)    dorzolamide-timolol (COSOPT) 22.3-6.8 MG/ML ophthalmic solution Place 1 drop into both eyes 2 (two) times daily.   DULoxetine (CYMBALTA) 60 MG capsule Take 1 capsule (60 mg total) by mouth daily.   enalapril (VASOTEC) 20 MG tablet Take 1 tablet (20 mg total) by mouth daily.   famotidine (PEPCID) 20 MG tablet Take 1 tablet (20 mg total) by mouth daily.   ferrous sulfate 325 (65 FE) MG tablet Take 325 mg by mouth daily with breakfast.   glucose blood (ACCU-CHEK AVIVA PLUS) test strip 1 each by Other route See admin instructions. USE TO TEST BLOOD SUGAR UP  TO 3 TIMES A DAY.   hydrocortisone (ANUSOL-HC) 2.5 % rectal cream Place 1 application rectally 2 (two) times daily as needed for hemorrhoids or anal itching. Please use twice daily x5 days, and then twice daily as needed. (Patient not taking: Reported on 07/05/2021)   hydrOXYzine (ATARAX/VISTARIL) 10 MG tablet Take 10 mg by mouth every 4 (four) hours as needed for itching.   insulin glargine (LANTUS) 100 unit/mL SOPN Inject 10 Units into the skin daily. (Patient taking differently: Inject 10 Units into the skin daily before breakfast.)   Insulin Pen Needle 32G X 4 MM MISC Use as directed with insulin   Insulin Syringe-Needle U-100 (SURE COMFORT INSULIN SYRINGE) 31G X 5/16" 0.3 ML MISC Inject 1 Syringe into the skin daily.   ketoconazole (NIZORAL) 2 % cream Apply 1 application topically daily as needed for irritation.   Latanoprostene Bunod (VYZULTA) 0.024 % SOLN Place 1  drop into both eyes at bedtime.   levothyroxine (SYNTHROID) 50 MCG tablet Take 1 tablet (50 mcg total) by mouth daily.   metFORMIN (GLUCOPHAGE-XR) 500 MG 24 hr tablet Take 2 tablets (1,000 mg total) by mouth daily with breakfast.   Misc. Devices (TRANSFER BOARD) MISC Use as needed for transfer from bed to chair or vice versa   Multiple Vitamins-Minerals (PRESERVISION AREDS 2+MULTI VIT) CAPS Take 1 capsule by mouth daily.   MYRBETRIQ 25 MG TB24 tablet TAKE 1 TABLET BY MOUTH DAILY  (Patient taking differently: Take 25 mg by mouth every morning.)   ondansetron (ZOFRAN ODT) 4 MG disintegrating tablet Take 1 tablet (4 mg total) by mouth every 8 (eight) hours as needed for nausea.   phosphorus (K PHOS NEUTRAL) 155-852-130 MG tablet Take 1 tablet (250 mg total) by mouth 2 (two) times daily for 3 days.   polyethylene glycol powder (GLYCOLAX/MIRALAX) 17 GM/SCOOP powder Take 17 g by mouth daily. (Patient taking differently: Take 17 g by mouth daily as needed (constipation).)   SARNA lotion Apply 1 application topically 4 (four) times daily as needed for itching.   Semaglutide (RYBELSUS) 3 MG TABS Take 3 mg by mouth daily.   senna (SENOKOT) 8.6 MG TABS tablet Take 1 tablet by mouth every morning.   tamsulosin (FLOMAX) 0.4 MG CAPS capsule Take 0.4 mg by mouth every morning.   traMADol (ULTRAM) 50 MG tablet Take 50 mg by mouth every 6 (six) hours as needed for severe pain.   witch hazel-glycerin (TUCKS) pad Apply 1 application topically 2 (two) times daily as needed for itching.   No facility-administered encounter medications on file as of 07/07/2021.    HOSPICE ELIGIBILITY/DIAGNOSIS: TBD  PAST MEDICAL HISTORY:  Past Medical History:  Diagnosis Date   Arthritis    Arthritis    Cataract    bil cateracts removed   Chronic kidney disease    "spot on one of my kidneys" per pt   Diabetes mellitus    Glaucoma    Hyperkalemia 07/02/2020   Hyperlipidemia    Hyperplastic colon polyp    Hypertension    Hypothyroidism 09/13/4495   Metabolic acidosis 53/00/5110   Syncope    Thyroid disease       ALLERGIES:  Allergies  Allergen Reactions   Tramadol Itching    Takes occasionally with benadryl   Levemir [Insulin Detemir] Itching      I spent 60 minutes providing this consultation; this includes time spent with patient/family, chart review and documentation. More than 50% of the time in this consultation was spent on counseling and coordinating communication   Thank you  for the opportunity to participate in the care of Rawan Riendeau Please call our office at [phone] if we can be of additional assistance.  Note: Portions of this note were generated with [employer]. Dictation errors may occur despite best attempts at proofreading.  Teodoro Spray, NP

## 2021-07-08 ENCOUNTER — Ambulatory Visit: Payer: Medicare Other

## 2021-07-09 NOTE — Patient Instructions (Signed)
Visit Information  Ms. Axford  it was nice speaking with you. Please call me directly [phone] if you have questions about the goals we discussed.  Patient Goals/Self Care Activities: -Patient/Caregiver will self-administer medications as prescribed as evidenced by self-report/primary caregiver report ,  -Patient/Caregiver will attend all scheduled provider appointments as evidenced by clinician review of documented attendance to scheduled appointments and patient/caregiver report, -Patient/Caregiver will call pharmacy for medication refills as evidenced by patient report and review of pharmacy fill history as appropriate,  -Patient/Caregiver will call provider office for new concerns or questions as evidenced by review of documented incoming telephone call notes and patient report,  -Patient/Caregiver verbalizes understanding of plan,  -Patient/Caregiver will focus on medication adherence by taking medications as prescribed -check blood sugar at prescribed times -check blood sugar if I feel it is too high or too low    The patient verbalized understanding of instructions, educational materials, and care plan provided today and declined offer to receive copy of patient instructions, educational materials, and care plan.   Follow up Plan: Patient would like continued follow-up.  CCM RNCM will outreach the patient within the next 4 weeks.  Patient will call office if needed prior to next encounter  Lazaro Arms, RN  [phone]

## 2021-07-09 NOTE — Chronic Care Management (AMB) (Signed)
Chronic Care Management   CCM RN Visit Note  07/09/2021 Name: Hayley Fisher MRN: [id number] DOB: 16-Feb-1931  Subjective: Hayley Fisher is a 87 y.o. year old female who is a primary care patient of Hayley Heck, MD. The care management team was consulted for assistance with disease management and care coordination needs.    Engaged with patient by telephone for follow up visit in response to provider referral for case management and/or care coordination services.   Consent to Services:  The patient was given information about Chronic Care Management services, agreed to services, and gave verbal consent prior to initiation of services.  Please see initial visit note for detailed documentation.   Patient agreed to services and verbal consent obtained.    Assessment: Patient's daughter provided all information during this encounter. The patient continues to maintain positive progress with care plan goals.. See Care Plan below for interventions and patient self-care actives. Follow up Plan: Patient would like continued follow-up.  CCM RNCM will outreach the patient within the next 4 weeks  Patient will call office if needed prior to next encounter  Review of patient past medical history, allergies, medications, health status, including review of consultants reports, laboratory and other test data, was performed as part of comprehensive evaluation and provision of chronic care management services.   SDOH (Social Determinants of Health) assessments and interventions performed:    CCM Care Plan  Allergies  Allergen Reactions   Tramadol Itching    Takes occasionally with benadryl   Levemir [Insulin Detemir] Itching    Outpatient Encounter Medications as of 07/08/2021  Medication Sig Note   Accu-Chek Softclix Lancets lancets 1 each by Other route See admin instructions. USE TO TEST BLOOD SUGAR UP  TO 3 TIMES A DAY.    aspirin 81 MG chewable tablet Chew 81 mg by mouth every morning.     atorvastatin (LIPITOR) 40 MG tablet Take 1 tablet (40 mg total) by mouth daily.    brimonidine (ALPHAGAN) 0.2 % ophthalmic solution Place 1 drop into the left eye 2 (two) times daily.    carbamide peroxide (DEBROX) 6.5 % OTIC solution Place 2 drops into both ears 2 (two) times daily as needed (ear wax).    Catheter Self-Adhesive Urinary MISC 1 patch by Does not apply route daily.    cetirizine (ZYRTEC) 10 MG tablet Take 10 mg by mouth daily.    clotrimazole-betamethasone (LOTRISONE) cream Apply 1 application topically 2 (two) times daily as needed (rash/itching).    diphenhydrAMINE (BENADRYL) 25 MG tablet Take 25 mg by mouth every 6 (six) hours as needed (with each dose of tramadol).    docusate sodium (COLACE) 100 MG capsule Take 1 capsule (100 mg total) by mouth 2 (two) times daily. (Patient taking differently: Take 100 mg by mouth every morning.)    dorzolamide-timolol (COSOPT) 22.3-6.8 MG/ML ophthalmic solution Place 1 drop into both eyes 2 (two) times daily.    DULoxetine (CYMBALTA) 60 MG capsule Take 1 capsule (60 mg total) by mouth daily.    enalapril (VASOTEC) 20 MG tablet Take 1 tablet (20 mg total) by mouth daily.    famotidine (PEPCID) 20 MG tablet Take 1 tablet (20 mg total) by mouth daily.    ferrous sulfate 325 (65 FE) MG tablet Take 325 mg by mouth daily with breakfast.    glucose blood (ACCU-CHEK AVIVA PLUS) test strip 1 each by Other route See admin instructions. USE TO TEST BLOOD SUGAR UP  TO 3 TIMES A DAY.  hydrocortisone (ANUSOL-HC) 2.5 % rectal cream Place 1 application rectally 2 (two) times daily as needed for hemorrhoids or anal itching. Please use twice daily x5 days, and then twice daily as needed. (Patient not taking: Reported on 07/05/2021) 07/05/2021: Daughter states that pharmacy has not yet delivered this medication    hydrOXYzine (ATARAX/VISTARIL) 10 MG tablet Take 10 mg by mouth every 4 (four) hours as needed for itching.    insulin glargine (LANTUS) 100 unit/mL  SOPN Inject 10 Units into the skin daily. (Patient taking differently: Inject 10 Units into the skin daily before breakfast.)    Insulin Pen Needle 32G X 4 MM MISC Use as directed with insulin    Insulin Syringe-Needle U-100 (SURE COMFORT INSULIN SYRINGE) 31G X 5/16" 0.3 ML MISC Inject 1 Syringe into the skin daily.    ketoconazole (NIZORAL) 2 % cream Apply 1 application topically daily as needed for irritation.    Latanoprostene Bunod (VYZULTA) 0.024 % SOLN Place 1 drop into both eyes at bedtime.    levothyroxine (SYNTHROID) 50 MCG tablet Take 1 tablet (50 mcg total) by mouth daily.    metFORMIN (GLUCOPHAGE-XR) 500 MG 24 hr tablet Take 2 tablets (1,000 mg total) by mouth daily with breakfast.    Misc. Devices (TRANSFER BOARD) MISC Use as needed for transfer from bed to chair or vice versa    Multiple Vitamins-Minerals (PRESERVISION AREDS 2+MULTI VIT) CAPS Take 1 capsule by mouth daily.    MYRBETRIQ 25 MG TB24 tablet TAKE 1 TABLET BY MOUTH DAILY (Patient taking differently: Take 25 mg by mouth every morning.)    ondansetron (ZOFRAN ODT) 4 MG disintegrating tablet Take 1 tablet (4 mg total) by mouth every 8 (eight) hours as needed for nausea.    [EXPIRED] phosphorus (K PHOS NEUTRAL) 155-852-130 MG tablet Take 1 tablet (250 mg total) by mouth 2 (two) times daily for 3 days.    polyethylene glycol powder (GLYCOLAX/MIRALAX) 17 GM/SCOOP powder Take 17 g by mouth daily. (Patient taking differently: Take 17 g by mouth daily as needed (constipation).)    SARNA lotion Apply 1 application topically 4 (four) times daily as needed for itching.    Semaglutide (RYBELSUS) 3 MG TABS Take 3 mg by mouth daily.    senna (SENOKOT) 8.6 MG TABS tablet Take 1 tablet by mouth every morning.    tamsulosin (FLOMAX) 0.4 MG CAPS capsule Take 0.4 mg by mouth every morning.    traMADol (ULTRAM) 50 MG tablet Take 50 mg by mouth every 6 (six) hours as needed for severe pain. 06/27/2021: Take with benadryl   witch hazel-glycerin  (TUCKS) pad Apply 1 application topically 2 (two) times daily as needed for itching.    No facility-administered encounter medications on file as of 07/08/2021.    Patient Active Problem List   Diagnosis Date Noted   COVID-19 virus infection 06/27/2021   Normocytic anemia 06/27/2021   Leukocytopenia 06/27/2021   Asymptomatic bacteriuria 06/27/2021   Malnutrition of moderate degree 02/23/2021   AMS (altered mental status) 02/21/2021   Cognitive impairment 52/84/1324   Acute metabolic encephalopathy 40/05/2724   Sepsis ([place]) 01/15/2021   Acute encephalopathy 12/09/2020   Hyperbilirubinemia 12/09/2020   Hypothyroidism 12/09/2020   Memory changes 10/20/2020   Acute on chronic anemia 09/21/2020   Gastroesophageal reflux disease without esophagitis 09/16/2020   Urinary frequency 09/11/2020   Stage 1 decubitus ulcer 07/30/2020   Uncontrolled type 2 diabetes mellitus with hyperglycemia ([place]) 07/02/2020   Unstable gait 04/03/2018   Frequent falls 04/03/2018  Age-related physical debility 03/22/2018   Hematuria 01/29/2018   Skin ulcer of sacrum, limited to breakdown of skin ([place]) 03/16/2017   Spinal stenosis of lumbar region 01/13/2016   Chronic venous insufficiency 11/09/2015   DDD (degenerative disc disease), lumbosacral 11/24/2014   Constipation by delayed colonic transit 11/24/2014   Urinary tract infection without hematuria 07/03/2014   Frequent UTI 10/29/2012   Pruritus 03/17/2011   Type 2 diabetes mellitus with diabetic neuropathy ([place]) 10/19/2006   Hyperlipidemia associated with type 2 diabetes mellitus ([place]) 10/19/2006   Hypertension associated with diabetes ([place]) 10/19/2006   Osteopenia 10/19/2006    Conditions to be addressed/monitored:DMII  Care Plan : Diabetes Type 2 (Adult)  Updates made by Lazaro Arms, RN since 07/09/2021 12:00 AM     Problem: Glycemic Management (Diabetes, Type 2)      Goal: Glycemic Management Optimized   Start Date: 03/10/2021  Expected  End Date: 08/20/2021  Recent Progress: On track  Priority: High  Note:   urrent Barriers:  Knowledge Deficits related to plan of care for management of DMII  Chronic Disease Management support and education needs related to DMII  RNCM Clinical Goal(s):  Patient/Daughter will verbalize understanding of plan for management of DMII through collaboration with RN Care manager, provider, and care team.   Interventions: 1:1 collaboration with primary care provider regarding development and update of comprehensive plan of care as evidenced by provider attestation and co-signature Inter-disciplinary care team collaboration (see longitudinal plan of care)   Diabetes:  (Status: Goal on track: YES.) Lab Results  Component Value Date   HGBA1C 8.0 (A) 06/09/2021  Assessed patient's/daughter understanding of A1c goal:  patient will remain at current level. Reviewed medications with patient and discussed importance of medication adherence; Discussed plans with patient for ongoing care management follow up and provided patient with direct contact information for care management team; Provided patient with verbal information regarding hypo and hyperglycemia and importance of correct treatment; RNCM collaborated with [place] patient, providing education and rationale, to check CBG and record, calling the office for findings outside established parameters; 07/08/21: I spoke with [name], and she said that Mrs. Rampersad is doing well. She was in the hospital from 06/27/21-07/02/21 for Covid. She was quarantined at home after her discharge for the amount of time requested and doing well. Her blood sugar levels are normal, and she no longer has to use oxygen; she has no fever or pain. She is eating well, taking her medications as prescribed, and using Miralax and Colace for her bowels to continue to move as needed. [name] has requested that I send a message to the provider for a standard wheelchair and  rollator, and she would like to see if she would be eligible for incontinence supplies.   Patient Goals/Self Care Activities: -Patient/Caregiver will self-administer medications as prescribed as evidenced by self-report/primary caregiver report ,  -Patient/Caregiver will attend all scheduled provider appointments as evidenced by clinician review of documented attendance to scheduled appointments and patient/caregiver report, -Patient/Caregiver will call pharmacy for medication refills as evidenced by patient report and review of pharmacy fill history as appropriate,  -Patient/Caregiver will call provider office for new concerns or questions as evidenced by review of documented incoming telephone call notes and patient report,  -Patient/Caregiver verbalizes understanding of plan,  -Patient/Caregiver will focus on medication adherence by taking medications as prescribed -check blood sugar at prescribed times -check blood sugar if I feel it is too high or too low  Lazaro Arms RN, BSN, [facility] Care Management Coordinator [place] Phone: [phone] I Fax: [phone]

## 2021-07-12 ENCOUNTER — Ambulatory Visit: Payer: Self-pay | Admitting: Licensed Clinical Social Worker

## 2021-07-12 ENCOUNTER — Ambulatory Visit: Payer: Self-pay

## 2021-07-12 NOTE — Patient Instructions (Signed)
   Patient was not contacted during this encounter.  LCSW provided consultation to assist with unmet needs.  Casimer Lanius, LCSW Care Management & Coordination  [phone]

## 2021-07-12 NOTE — Patient Instructions (Signed)
The patient was not interviewed or contacted during this encounter. RNCM researched and sent information via epic for the patient to obtain incontinence supplies.  Lazaro Arms RN, BSN, [facility] Care Management Coordinator [place] Phone: [phone] I Fax: [phone]

## 2021-07-12 NOTE — Chronic Care Management (AMB) (Signed)
  Care Management  Collaboration  Note  07/12/2021 Name: Hayley Fisher MRN: [id number] DOB: 04-Jan-1931 Hayley Fisher is a 87 y.o. Year old female is a primary care patient of [name], MD. The CCM team was Collaboration reference care coordination needs for urinary incontinence supplies using  Medicaid.  Assessment: The patient was not interviewed or contacted during this encounter.   Intervention: Conducted brief assessment, recommendations, and relevant information discussed. CCM RN collaborated with CCM LCSW. CCM RN sent Epic communication to [employer] for the patient to begin the process for incontinence supplies. CCRN called and spoke with [name] to have an appointment with the PCP regarding Rollator and Incontinence supplies scheduled for 07/14/21. Also, Note sent the PCP with all the information for the patient.   Follow-up Plan:  CCM RN will follow up with the patient at the next scheduled interval.  Collaboration with [name], MD regarding development and update of comprehensive plan of care as evidenced by provider attestation and co-signature Review of patient past medical history, allergies, medications, and health status, including review of pertinent consultant reports was performed as part of comprehensive evaluation and provision of care management/care coordination services.     Lazaro Arms RN, BSN, [facility] Care Management Coordinator [place] Phone: [phone] I Fax: [phone]

## 2021-07-12 NOTE — Chronic Care Management (AMB) (Signed)
  Care Management  Consultation Note  07/12/2021 Name: Mariam Helbert MRN: [id number] DOB: March 10, 1931  Starlynn Klinkner is a 87 y.o. year old female who is a primary care patient of [name], MD. The CCM team was consulted reference care coordination needs for  urinary incontinence supplies via Medicaid .  Assessment: Patient was not interviewed or contacted during this encounter.    Intervention:Conducted brief assessment, recommendations and relevant information discussed. CCM LCSW collaborated with CCM RN .   Follow up Plan:  No follow up required by CCM LCSW. CCM RN will continue to follow patient and consult with CCM LCSW as needed.    Review of patient past medical history, allergies, medications, and health status, including review of pertinent consultant reports was performed as part of comprehensive evaluation and provision of care management/care coordination services.    Casimer Lanius, [place] / [place]   [phone]

## 2021-07-14 ENCOUNTER — Other Ambulatory Visit: Payer: Self-pay

## 2021-07-14 ENCOUNTER — Other Ambulatory Visit: Payer: Self-pay | Admitting: Student

## 2021-07-14 ENCOUNTER — Encounter: Payer: Self-pay | Admitting: Student

## 2021-07-14 ENCOUNTER — Ambulatory Visit (INDEPENDENT_AMBULATORY_CARE_PROVIDER_SITE_OTHER): Payer: Medicare Other | Admitting: Family Medicine

## 2021-07-14 VITALS — BP 146/84 | HR 104 | Wt 126.0 lb

## 2021-07-14 DIAGNOSIS — U071 COVID-19: Secondary | ICD-10-CM | POA: Diagnosis not present

## 2021-07-14 DIAGNOSIS — R32 Unspecified urinary incontinence: Secondary | ICD-10-CM

## 2021-07-14 DIAGNOSIS — Z09 Encounter for follow-up examination after completed treatment for conditions other than malignant neoplasm: Secondary | ICD-10-CM

## 2021-07-14 DIAGNOSIS — L89312 Pressure ulcer of right buttock, stage 2: Secondary | ICD-10-CM | POA: Diagnosis not present

## 2021-07-14 NOTE — Patient Instructions (Addendum)
It was nice seeing you today!  We recommend using barrier creams such as Vaseline to help protect the skin.  Recommend using a flat seat cushion. You can get this over the counter.  Make sure to follow-up with your PCP for your other medical issues.  Please arrive at least 15 minutes prior to your scheduled appointments.  Stay well, Zola Button, MD [place] [phone]

## 2021-07-14 NOTE — Assessment & Plan Note (Signed)
Recovering well.  We will obtain BMP and magnesium as advised by hospitalist on discharge.  She did require repletion during hospitalization.

## 2021-07-14 NOTE — Progress Notes (Signed)
    SUBJECTIVE:   CHIEF COMPLAINT / HPI: Hospital follow-up COVID-19 infection  Patient recently mated from 11/6-11/11 at [facility] for COVID-19 infection.  Received course of remdesivir and steroids.  Did not require oxygen.  Urine culture positive for ESBL but felt to be secondary to colonization by ID so was not treated.  Recommendation to have BMP and Mg checked on follow-up. She is accompanied today by her daughter.  She reports she is doing well overall recovering from [place] but still has a lingering cough.  Daughter asking about what can be done about buttock pain from prolonged sitting in the wheelchair.  She reports skin is fragile and has occasional skin breakdown.  She has 1 spot on her right buttock for 1 week which she reports is healing well.  She also has hemorrhoids.  She does have a seat cushion for her wheelchair.   PERTINENT  PMH / PSH: T2DM (followed by endocrinology), HTN, HLD  OBJECTIVE:   BP (!) 146/84   Pulse (!) 104   Wt 126 lb (57.2 kg)   BMI 23.05 kg/m   General: Elderly female seated in wheelchair, NAD, Foley catheter in place CV: RRR, no murmurs Pulm: CTAB, no wheezes or rales GU: Chaperone present.  Subcentimeter stage II pressure ulcer visualized on superior right buttock.  External hemorrhoid visualized.    ASSESSMENT/PLAN:   Pressure injury of right buttock, stage 2 ([place]) Secondary to wheelchair use.  Appears to be healing well.  Advised to continue using Vaseline and flat cushion.  COVID-19 virus infection Recovering well.  We will obtain BMP and magnesium as advised by hospitalist on discharge.  She did require repletion during hospitalization.     Zola Button, MD [place]

## 2021-07-14 NOTE — Assessment & Plan Note (Addendum)
Secondary to wheelchair use.  Appears to be healing well.  Advised to continue using Vaseline and flat cushion.

## 2021-07-15 LAB — BASIC METABOLIC PANEL
BUN/Creatinine Ratio: 19 (ref 12–28)
BUN: 14 mg/dL (ref 8–27)
CO2: 27 mmol/L (ref 20–29)
Calcium: 9 mg/dL (ref 8.7–10.3)
Chloride: 100 mmol/L (ref 96–106)
Creatinine, Ser: 0.75 mg/dL (ref 0.57–1.00)
Glucose: 374 mg/dL — ABNORMAL HIGH (ref 70–99)
Potassium: 5.3 mmol/L — ABNORMAL HIGH (ref 3.5–5.2)
Sodium: 139 mmol/L (ref 134–144)
eGFR: 77 mL/min/{1.73_m2} (ref >59)

## 2021-07-15 LAB — MAGNESIUM: Magnesium: 1.7 mg/dL (ref 1.6–2.3)

## 2021-07-18 DIAGNOSIS — M6281 Muscle weakness (generalized): Secondary | ICD-10-CM | POA: Diagnosis not present

## 2021-07-18 DIAGNOSIS — M48061 Spinal stenosis, lumbar region without neurogenic claudication: Secondary | ICD-10-CM | POA: Diagnosis not present

## 2021-07-21 ENCOUNTER — Other Ambulatory Visit: Payer: Self-pay | Admitting: Student

## 2021-07-21 ENCOUNTER — Ambulatory Visit: Payer: Self-pay

## 2021-07-21 DIAGNOSIS — R2681 Unsteadiness on feet: Secondary | ICD-10-CM

## 2021-07-21 NOTE — Chronic Care Management (AMB) (Signed)
   RN Case Manager Care Management   Phone Outreach    07/21/2021 Name: Hayley Fisher MRN: [id number] DOB: 08-Oct-1930  Hayley Fisher is a 87 y.o. year old female who is a primary care patient of [name], MD .   CM RN received a message from [place] and her hospital bed. She would like another bed for her mother. The bed they have now is old, and it hurts her. She states that she has a wound in the coccyx area that is not open that they monitored closely, but they do not want it to worsen. She requested that I send in a message to the PCP asking for an order for a new one.  Intervention: CM RN will send a note to PCP as requested   Follow Up Plan:  CM RN will follow-up at next scheduled interval.    Review of patient status, including review of consultants reports, relevant laboratory and other test results, and collaboration with appropriate care team members and the patient's provider was performed as part of comprehensive patient evaluation and provision of care management services.    Lazaro Arms RN, BSN, [facility] Care Management Coordinator [place] Phone: [phone] I Fax: [phone]

## 2021-07-28 ENCOUNTER — Telehealth: Payer: Self-pay | Admitting: Student

## 2021-07-28 NOTE — Telephone Encounter (Signed)
[employer] calling regarding the order for incontinence supplies. Form was received on November 11th and is in PCP's box. Please advise.

## 2021-07-30 ENCOUNTER — Other Ambulatory Visit: Payer: Self-pay | Admitting: Family Medicine

## 2021-07-30 DIAGNOSIS — E1169 Type 2 diabetes mellitus with other specified complication: Secondary | ICD-10-CM

## 2021-08-02 ENCOUNTER — Other Ambulatory Visit: Payer: Self-pay

## 2021-08-02 ENCOUNTER — Encounter (HOSPITAL_COMMUNITY): Payer: Self-pay | Admitting: Emergency Medicine

## 2021-08-02 ENCOUNTER — Emergency Department (HOSPITAL_COMMUNITY)
Admission: EM | Admit: 2021-08-02 | Discharge: 2021-08-03 | Disposition: A | Discharge status: Home / Self Care | Payer: Medicare Other | Attending: Emergency Medicine | Admitting: Emergency Medicine

## 2021-08-02 ENCOUNTER — Emergency Department (HOSPITAL_COMMUNITY): Payer: Medicare Other

## 2021-08-02 ENCOUNTER — Other Ambulatory Visit (HOSPITAL_COMMUNITY): Payer: Self-pay

## 2021-08-02 DIAGNOSIS — E1122 Type 2 diabetes mellitus with diabetic chronic kidney disease: Secondary | ICD-10-CM | POA: Diagnosis not present

## 2021-08-02 DIAGNOSIS — R6889 Other general symptoms and signs: Secondary | ICD-10-CM | POA: Diagnosis not present

## 2021-08-02 DIAGNOSIS — Z466 Encounter for fitting and adjustment of urinary device: Secondary | ICD-10-CM

## 2021-08-02 DIAGNOSIS — Z743 Need for continuous supervision: Secondary | ICD-10-CM | POA: Diagnosis not present

## 2021-08-02 DIAGNOSIS — I700 Atherosclerosis of aorta: Secondary | ICD-10-CM | POA: Diagnosis not present

## 2021-08-02 DIAGNOSIS — M47816 Spondylosis without myelopathy or radiculopathy, lumbar region: Secondary | ICD-10-CM | POA: Diagnosis not present

## 2021-08-02 DIAGNOSIS — R55 Syncope and collapse: Secondary | ICD-10-CM | POA: Diagnosis not present

## 2021-08-02 DIAGNOSIS — K5641 Fecal impaction: Secondary | ICD-10-CM | POA: Diagnosis not present

## 2021-08-02 DIAGNOSIS — Z79899 Other long term (current) drug therapy: Secondary | ICD-10-CM | POA: Insufficient documentation

## 2021-08-02 DIAGNOSIS — J9811 Atelectasis: Secondary | ICD-10-CM | POA: Diagnosis not present

## 2021-08-02 DIAGNOSIS — E039 Hypothyroidism, unspecified: Secondary | ICD-10-CM | POA: Insufficient documentation

## 2021-08-02 DIAGNOSIS — Z87891 Personal history of nicotine dependence: Secondary | ICD-10-CM | POA: Insufficient documentation

## 2021-08-02 DIAGNOSIS — Z7982 Long term (current) use of aspirin: Secondary | ICD-10-CM | POA: Diagnosis not present

## 2021-08-02 DIAGNOSIS — Z794 Long term (current) use of insulin: Secondary | ICD-10-CM | POA: Insufficient documentation

## 2021-08-02 DIAGNOSIS — Z7984 Long term (current) use of oral hypoglycemic drugs: Secondary | ICD-10-CM | POA: Diagnosis not present

## 2021-08-02 DIAGNOSIS — N189 Chronic kidney disease, unspecified: Secondary | ICD-10-CM | POA: Diagnosis not present

## 2021-08-02 DIAGNOSIS — R0602 Shortness of breath: Secondary | ICD-10-CM | POA: Insufficient documentation

## 2021-08-02 DIAGNOSIS — M419 Scoliosis, unspecified: Secondary | ICD-10-CM | POA: Diagnosis not present

## 2021-08-02 DIAGNOSIS — R079 Chest pain, unspecified: Secondary | ICD-10-CM | POA: Diagnosis not present

## 2021-08-02 DIAGNOSIS — I499 Cardiac arrhythmia, unspecified: Secondary | ICD-10-CM | POA: Diagnosis not present

## 2021-08-02 DIAGNOSIS — R0789 Other chest pain: Secondary | ICD-10-CM | POA: Diagnosis not present

## 2021-08-02 DIAGNOSIS — K219 Gastro-esophageal reflux disease without esophagitis: Secondary | ICD-10-CM | POA: Insufficient documentation

## 2021-08-02 DIAGNOSIS — Z8616 Personal history of COVID-19: Secondary | ICD-10-CM | POA: Diagnosis not present

## 2021-08-02 DIAGNOSIS — I129 Hypertensive chronic kidney disease with stage 1 through stage 4 chronic kidney disease, or unspecified chronic kidney disease: Secondary | ICD-10-CM | POA: Diagnosis not present

## 2021-08-02 DIAGNOSIS — R404 Transient alteration of awareness: Secondary | ICD-10-CM | POA: Diagnosis not present

## 2021-08-02 DIAGNOSIS — E1140 Type 2 diabetes mellitus with diabetic neuropathy, unspecified: Secondary | ICD-10-CM | POA: Insufficient documentation

## 2021-08-02 LAB — CBC
HCT: 34.2 % — ABNORMAL LOW (ref 36.0–46.0)
Hemoglobin: 10.6 g/dL — ABNORMAL LOW (ref 12.0–15.0)
MCH: 31.6 pg (ref 26.0–34.0)
MCHC: 31 g/dL (ref 30.0–36.0)
MCV: 102.1 fL — ABNORMAL HIGH (ref 80.0–100.0)
Platelets: 209 10*3/uL (ref 150–400)
RBC: 3.35 MIL/uL — ABNORMAL LOW (ref 3.87–5.11)
RDW: 13.8 % (ref 11.5–15.5)
WBC: 5.3 10*3/uL (ref 4.0–10.5)
nRBC: 0 % (ref 0.0–0.2)

## 2021-08-02 LAB — BASIC METABOLIC PANEL
Anion gap: 14 (ref 5–15)
BUN: 25 mg/dL — ABNORMAL HIGH (ref 8–23)
CO2: 22 mmol/L (ref 22–32)
Calcium: 8.7 mg/dL — ABNORMAL LOW (ref 8.9–10.3)
Chloride: 100 mmol/L (ref 98–111)
Creatinine, Ser: 1.02 mg/dL — ABNORMAL HIGH (ref 0.44–1.00)
GFR, Estimated: 53 mL/min — ABNORMAL LOW (ref >60)
Glucose, Bld: 219 mg/dL — ABNORMAL HIGH (ref 70–99)
Potassium: 4.5 mmol/L (ref 3.5–5.1)
Sodium: 136 mmol/L (ref 135–145)

## 2021-08-02 LAB — TROPONIN I (HIGH SENSITIVITY): Troponin I (High Sensitivity): 5 ng/L (ref <18)

## 2021-08-02 MED ORDER — SODIUM CHLORIDE 0.9% FLUSH: 3.0000 mL | Freq: Once | INTRAVENOUS | Status: DC

## 2021-08-02 NOTE — ED Provider Notes (Signed)
Emergency Medicine Provider Triage Evaluation Note  Hayley Fisher , a 87 y.o. female  was evaluated in triage.  Pt complains of syncopal episode that occurred just prior to arrival.  Patient states he was having a BM and was forcing upon standing up she experienced some chest tightness and shortness of breath and had a syncopal episode.  Chest tightness continued after she came to.  No nausea, vomiting, diarrhea.  Review of Systems  Positive:  Negative: See above   Physical Exam  BP 125/77 (BP Location: Right Arm)   Pulse (!) 102   Temp 98.2 F (36.8 C) (Oral)   Resp 16   SpO2 99%  Gen:   Awake, no distress   Resp:  Normal effort  MSK:   Moves extremities without difficulty  Other:    Medical Decision Making  Medically screening exam initiated at 9:24 PM.  Appropriate orders placed.  Hayley Fisher was informed that the remainder of the evaluation will be completed by another provider, this initial triage assessment does not replace that evaluation, and the importance of remaining in the ED until their evaluation is complete.     Hendricks Limes, PA-C 08/02/21 2125    Tegeler, Gwenyth Allegra, MD 08/02/21 [phone]

## 2021-08-02 NOTE — ED Triage Notes (Signed)
Patient comes from home, patient was on bedside commode and now having chest tightness and shortness of breath.  Patient did have a syncopal episode after having large bowel movement.  She is now awake, CAOx4.  Chest tightness continued after waking up.  She was given 324mg  of ASA.

## 2021-08-03 ENCOUNTER — Emergency Department (HOSPITAL_COMMUNITY): Payer: Medicare Other

## 2021-08-03 DIAGNOSIS — I700 Atherosclerosis of aorta: Secondary | ICD-10-CM | POA: Diagnosis not present

## 2021-08-03 DIAGNOSIS — M419 Scoliosis, unspecified: Secondary | ICD-10-CM | POA: Diagnosis not present

## 2021-08-03 DIAGNOSIS — K5641 Fecal impaction: Secondary | ICD-10-CM | POA: Diagnosis not present

## 2021-08-03 DIAGNOSIS — M47816 Spondylosis without myelopathy or radiculopathy, lumbar region: Secondary | ICD-10-CM | POA: Diagnosis not present

## 2021-08-03 DIAGNOSIS — R0789 Other chest pain: Secondary | ICD-10-CM | POA: Diagnosis not present

## 2021-08-03 LAB — URINALYSIS, ROUTINE W REFLEX MICROSCOPIC
Bilirubin Urine: NEGATIVE
Glucose, UA: NEGATIVE mg/dL
Ketones, ur: NEGATIVE mg/dL
Nitrite: NEGATIVE
Protein, ur: 100 mg/dL — AB
Specific Gravity, Urine: 1.025 (ref 1.005–1.030)
pH: 6 (ref 5.0–8.0)

## 2021-08-03 LAB — URINALYSIS, MICROSCOPIC (REFLEX)
RBC / HPF: >50 RBC/hpf (ref 0–5)
WBC, UA: >50 WBC/hpf (ref 0–5)

## 2021-08-03 LAB — TROPONIN I (HIGH SENSITIVITY): Troponin I (High Sensitivity): 5 ng/L (ref <18)

## 2021-08-03 NOTE — ED Provider Notes (Signed)
[facility] EMERGENCY DEPARTMENT Provider Note   CSN: [id number] Arrival date & time: 08/02/21  2105     History Chief Complaint  Patient presents with   Chest Pain    Hayley Fisher is a 87 y.o. female.  HPI     This is an 87 year old female with history of diabetes, hypertension, hyperlipidemia, chronic kidney disease who presents with an episode of syncope and chest pain.  Patient reports that she has recently been impacted.  She sat on the toilet for a long time tonight trying to have a bowel movement.  One of her daughters manually disimpacted her.  She states she had onset of chest discomfort while having a bowel movement and subsequently passed out.  She did not have a prodrome.  Per the patient's daughter, when she woke up, she was appropriate.  She continued to have some chest discomfort.  She describes pain that radiated into the bilateral shoulders.  She did have some shortness of breath.  Following the episode, chest discomfort continued.  However, right now she is without pain.  No known history of coronary artery disease.  Past Medical History:  Diagnosis Date   Arthritis    Arthritis    Cataract    bil cateracts removed   Chronic kidney disease    "spot on one of my kidneys" per pt   Diabetes mellitus    Glaucoma    Hyperkalemia 07/02/2020   Hyperlipidemia    Hyperplastic colon polyp    Hypertension    Hypothyroidism 2/67/1245   Metabolic acidosis 80/99/8338   Syncope    Thyroid disease     Patient Active Problem List   Diagnosis Date Noted   Incontinence 07/14/2021   Pressure injury of right buttock, stage 2 ([place]) 07/14/2021   COVID-19 virus infection 06/27/2021   Normocytic anemia 06/27/2021   Leukocytopenia 06/27/2021   Asymptomatic bacteriuria 06/27/2021   Malnutrition of moderate degree 02/23/2021   AMS (altered mental status) 02/21/2021   Cognitive impairment 25/12/3974   Acute metabolic encephalopathy 73/41/9379   Sepsis  ([place]) 01/15/2021   Acute encephalopathy 12/09/2020   Hyperbilirubinemia 12/09/2020   Hypothyroidism 12/09/2020   Memory changes 10/20/2020   Acute on chronic anemia 09/21/2020   Gastroesophageal reflux disease without esophagitis 09/16/2020   Urinary frequency 09/11/2020   Stage 1 decubitus ulcer 07/30/2020   Uncontrolled type 2 diabetes mellitus with hyperglycemia ([place]) 07/02/2020   Unstable gait 04/03/2018   Frequent falls 04/03/2018   Age-related physical debility 03/22/2018   Hematuria 01/29/2018   Skin ulcer of sacrum, limited to breakdown of skin ([place]) 03/16/2017   Spinal stenosis of lumbar region 01/13/2016   Chronic venous insufficiency 11/09/2015   DDD (degenerative disc disease), lumbosacral 11/24/2014   Constipation by delayed colonic transit 11/24/2014   Urinary tract infection without hematuria 07/03/2014   Frequent UTI 10/29/2012   Pruritus 03/17/2011   Type 2 diabetes mellitus with diabetic neuropathy ([place]) 10/19/2006   Hyperlipidemia associated with type 2 diabetes mellitus ([place]) 10/19/2006   Hypertension associated with diabetes ([place]) 10/19/2006   Osteopenia 10/19/2006    Past Surgical History:  Procedure Laterality Date   ABDOMINAL HYSTERECTOMY     BLADDER SUSPENSION     bladder tacking     COLONOSCOPY     EYE SURGERY     INGUINAL HERNIA REPAIR Right    IR IVC FILTER PLMT / S&I /IMG GUID/MOD SED  01/16/2021     OB History   No obstetric history on file.  Family History  Problem Relation Age of Onset   Colon cancer Mother    Other Father        cerebral hemorrhage   Diabetes Brother    Diabetes Sister        x 2   Bone cancer Daughter    Breast cancer Daughter 77   Esophageal cancer Neg Hx    Rectal cancer Neg Hx    Stomach cancer Neg Hx     Social History   Tobacco Use   Smoking status: Former    Types: Cigarettes    Quit date: 11/29/1980    Years since quitting: 40.7   Smokeless tobacco: Never  Vaping Use   Vaping Use: Never used   Substance Use Topics   Alcohol use: No    Alcohol/week: 0.0 standard drinks   Drug use: No    Home Medications Prior to Admission medications   Medication Sig Start Date End Date Taking? Authorizing Provider  aspirin 81 MG chewable tablet Chew 81 mg by mouth every morning.   Yes [provider]  atorvastatin (LIPITOR) 40 MG tablet TAKE 1 TABLET BY MOUTH EVERY DAY Patient taking differently: Take 40 mg by mouth daily. 07/30/21  Yes [name], MD  brimonidine (ALPHAGAN) 0.2 % ophthalmic solution Place 1 drop into the left eye 2 (two) times daily. 03/30/21  Yes [provider]  carbamide peroxide (DEBROX) 6.5 % OTIC solution Place 2 drops into both ears 2 (two) times daily as needed (ear wax). 01/20/21  Yes Aline August, MD  cetirizine (ZYRTEC) 10 MG tablet Take 10 mg by mouth daily as needed for allergies.   Yes [provider]  clotrimazole-betamethasone (LOTRISONE) cream Apply 1 application topically 2 (two) times daily as needed (rash/itching). 05/13/21  Yes [provider]  diphenhydrAMINE (BENADRYL) 25 MG tablet Take 25 mg by mouth every 6 (six) hours as needed for itching (with each dose of tramadol).   Yes [provider]  docusate sodium (COLACE) 100 MG capsule Take 1 capsule (100 mg total) by mouth 2 (two) times daily. Patient taking differently: Take 100 mg by mouth every morning. 12/15/20  Yes Harold Hedge, MD  dorzolamide-timolol (COSOPT) 22.3-6.8 MG/ML ophthalmic solution Place 1 drop into both eyes 2 (two) times daily. 05/18/20  Yes [provider]  DULoxetine (CYMBALTA) 60 MG capsule Take 1 capsule (60 mg total) by mouth daily. 02/25/21  Yes Jennye Boroughs, MD  enalapril (VASOTEC) 20 MG tablet Take 1 tablet (20 mg total) by mouth daily. 09/21/20  Yes Mullis, Kiersten P, DO  famotidine (PEPCID) 20 MG tablet Take 1 tablet (20 mg total) by mouth daily. 02/25/21  Yes Jennye Boroughs, MD  ferrous sulfate 325 (65 FE) MG tablet Take  325 mg by mouth daily with breakfast.   Yes [provider]  hydrocortisone (ANUSOL-HC) 2.5 % rectal cream Place 1 application rectally 2 (two) times daily as needed for hemorrhoids or anal itching. Please use twice daily x5 days, and then twice daily as needed. 07/02/21  Yes Eugenie Filler, MD  hydrOXYzine (ATARAX/VISTARIL) 10 MG tablet Take 10 mg by mouth every 4 (four) hours as needed for itching. 04/09/21  Yes [provider]  insulin glargine (LANTUS) 100 unit/mL SOPN Inject 10 Units into the skin daily. Patient taking differently: Inject 10 Units into the skin daily before breakfast. 04/15/21  Yes Renato Shin, MD  ketoconazole (NIZORAL) 2 % cream Apply 1 application topically daily as needed for irritation. 03/11/21  Yes  [provider]  Latanoprostene Bunod (VYZULTA) 0.024 % SOLN Place 1 drop into both eyes at bedtime.   Yes [provider]  levothyroxine (SYNTHROID) 50 MCG tablet Take 1 tablet (50 mcg total) by mouth daily. 11/10/20  Yes Renato Shin, MD  metFORMIN (GLUCOPHAGE-XR) 500 MG 24 hr tablet Take 2 tablets (1,000 mg total) by mouth daily with breakfast. 04/06/21  Yes Renato Shin, MD  Multiple Vitamins-Minerals (PRESERVISION AREDS 2+MULTI VIT) CAPS Take 1 capsule by mouth daily.   Yes [provider]  MYRBETRIQ 25 MG TB24 tablet TAKE 1 TABLET BY MOUTH DAILY Patient taking differently: Take 25 mg by mouth every morning. 04/01/21  Yes [name], MD  ondansetron (ZOFRAN ODT) 4 MG disintegrating tablet Take 1 tablet (4 mg total) by mouth every 8 (eight) hours as needed for nausea. 07/28/20  Yes Larene Pickett, PA-C  polyethylene glycol powder (GLYCOLAX/MIRALAX) 17 GM/SCOOP powder Take 17 g by mouth daily. Patient taking differently: Take 17 g by mouth daily as needed (constipation). 12/15/20  Yes Harold Hedge, MD  SARNA lotion Apply 1 application topically 4 (four) times daily as needed for itching. 05/27/21  Yes [provider]  Semaglutide (RYBELSUS) 3 MG TABS Take 3 mg by mouth daily. 11/09/20  Yes Renato Shin, MD  senna (SENOKOT) 8.6 MG TABS tablet Take 1 tablet by mouth every morning. 05/17/21  Yes [provider]  tamsulosin (FLOMAX) 0.4 MG CAPS capsule Take 0.4 mg by mouth every morning. 10/08/20  Yes [provider]  traMADol (ULTRAM) 50 MG tablet Take 50 mg by mouth every 6 (six) hours as needed for severe pain. 05/27/21  Yes [provider]  witch hazel-glycerin (TUCKS) pad Apply 1 application topically 2 (two) times daily as needed for itching. 02/25/21  Yes Jennye Boroughs, MD  Accu-Chek Softclix Lancets lancets 1 each by Other route See admin instructions. USE TO TEST BLOOD SUGAR UP  TO 3 TIMES A DAY. 02/25/21   Jennye Boroughs, MD  Catheter Self-Adhesive Urinary MISC 1 patch by Does not apply route daily. 10/26/20   Richarda Osmond, MD  glucose blood (ACCU-CHEK AVIVA PLUS) test strip 1 each by Other route See admin instructions. USE TO TEST BLOOD SUGAR UP  TO 3 TIMES A DAY. 02/25/21   Jennye Boroughs, MD  Insulin Pen Needle 32G X 4 MM MISC Use as directed with insulin 04/12/21     Insulin Syringe-Needle U-100 (SURE COMFORT INSULIN SYRINGE) 31G X 5/16" 0.3 ML MISC Inject 1 Syringe into the skin daily. 02/25/21   Jennye Boroughs, MD  Misc. Devices (TRANSFER BOARD) MISC Use as needed for transfer from bed to chair or vice versa 02/25/21   Jennye Boroughs, MD    Allergies    Tramadol and Levemir [insulin detemir]  Review of Systems   Review of Systems  Constitutional:  Negative for fever.  Respiratory:  Positive for shortness of breath.   Cardiovascular:  Positive for chest pain.  Gastrointestinal:  Positive for constipation. Negative for diarrhea, nausea and vomiting.  Neurological:  Positive for syncope.  All other systems reviewed and are negative.  Physical Exam Updated Vital Signs BP 102/67   Pulse 96   Temp 98.6 F (37 C) (Oral)   Resp 13   SpO2 97%   Physical Exam Vitals and  nursing note reviewed.  Constitutional:      Appearance: She is well-developed. She is not ill-appearing.     Comments: Frail, chronically ill-appearing, nontoxic  HENT:  Head: Normocephalic and atraumatic.     Mouth/Throat:     Mouth: Mucous membranes are dry.  Eyes:     Pupils: Pupils are equal, round, and reactive to light.  Cardiovascular:     Rate and Rhythm: Normal rate and regular rhythm.     Heart sounds: Normal heart sounds.  Pulmonary:     Effort: Pulmonary effort is normal. No respiratory distress.     Breath sounds: No wheezing.  Abdominal:     Palpations: Abdomen is soft.     Tenderness: There is no abdominal tenderness. There is no guarding or rebound.  Genitourinary:    Comments: Foley in place with turbid output Musculoskeletal:     Cervical back: Neck supple.     Right lower leg: No edema.     Left lower leg: No edema.  Skin:    General: Skin is warm and dry.  Neurological:     Mental Status: She is alert and oriented to person, place, and time.  Psychiatric:        Mood and Affect: Mood normal.    ED Results / Procedures / Treatments   Labs (all labs ordered are listed, but only abnormal results are displayed) Labs Reviewed  BASIC METABOLIC PANEL - Abnormal; Notable for the following components:      Result Value   Glucose, Bld 219 (*)    BUN 25 (*)    Creatinine, Ser 1.02 (*)    Calcium 8.7 (*)    GFR, Estimated 53 (*)    All other components within normal limits  CBC - Abnormal; Notable for the following components:   RBC 3.35 (*)    Hemoglobin 10.6 (*)    HCT 34.2 (*)    MCV 102.1 (*)    All other components within normal limits  URINALYSIS, ROUTINE W REFLEX MICROSCOPIC - Abnormal; Notable for the following components:   APPearance TURBID (*)    Hgb urine dipstick LARGE (*)    Protein, ur 100 (*)    Leukocytes,Ua LARGE (*)    All other components within normal limits  URINALYSIS, MICROSCOPIC (REFLEX) - Abnormal; Notable for the  following components:   Bacteria, UA MANY (*)    Non Squamous Epithelial PRESENT (*)    All other components within normal limits  URINE CULTURE  TROPONIN I (HIGH SENSITIVITY)  TROPONIN I (HIGH SENSITIVITY)    EKG EKG Interpretation  Date/Time:  Monday August 02 2021 21:24:37 EST Ventricular Rate:  108 PR Interval:  170 QRS Duration: 76 QT Interval:  338 QTC Calculation: 452 R Axis:   -49 Text Interpretation: Sinus tachycardia Left axis deviation Anterolateral infarct , age undetermined Abnormal ECG Confirmed by Thayer Jew [phone]) on 08/02/2021 11:41:14 PM  Radiology DG Chest 2 View  Result Date: 08/02/2021 CLINICAL DATA:  Chest pain, syncope EXAM: CHEST - 2 VIEW COMPARISON:  06/27/2021 FINDINGS: Bibasilar atelectasis. Heart is normal size. No effusions or acute bony abnormality. IMPRESSION: Bibasilar atelectasis. Electronically Signed   By: Rolm Baptise M.D.   On: 08/02/2021 22:02   DG Abdomen 1 View  Result Date: 08/03/2021 CLINICAL DATA:  Stool impaction. EXAM: ABDOMEN - 1 VIEW COMPARISON:  Abdomen film of 01/15/2021 FINDINGS: The patient is rotated oblique to the left. There is a nonobstructive overall bowel pattern with moderate increased fecal retention throughout the large intestine. A portion of the left lateral abdomen is excluded from exam. An IVC filter has been inserted since the previous exam and superimposed at the level of  L2 and 3. Visceral shadows are obscured by stool. The aorta and common iliac arteries are heavily calcified. There is osteopenia, levoscoliosis and degenerative changes of the lumbar spine. IMPRESSION: Nonobstructive overall bowel pattern with increased fecal retention throughout the colon. Electronically Signed   By: Telford Nab M.D.   On: 08/03/2021 00:40    Procedures Procedures   Medications Ordered in ED Medications  sodium chloride flush (NS) 0.9 % injection 3 mL (3 mLs Intravenous Not Given 08/03/21 0004)    ED Course  I  have reviewed the triage vital signs and the nursing notes.  Pertinent labs & imaging results that were available during my care of the patient were reviewed by me and considered in my medical decision making (see chart for details).  Clinical Course as of 08/03/21 0300  Tue Aug 03, 2021  0256 Daughter is now at the bedside.  States that her mother has had episodes like this in the past where she has syncopized after having a large bowel movement.  Currently the patient is asymptomatic.  No ongoing chest pain.  Work-up is very reassuring.  We discussed options given the chest pain in addition of the syncope, would not be unreasonable to admit for observation and cardiac monitoring; however, with reassuring troponin profile and EKG and high likelihood for vasovagal episode, discharge home with close cardiology follow-up is also not unreasonable.  Urine is turbid.  She is due for Foley catheter change.  She has no back pain or fevers.  Will culture but will hold off on antibiotics at this time.  Will change Foley catheter.  Patient would like to go home.  Given overall reassuring work-up and high likelihood for vasovagal episode, feel this is reasonable with close outpatient follow-up. [CH]    Clinical Course User Index [CH] Arrabella Westerman, Barbette Hair, MD   MDM Rules/Calculators/A&P                           Patient presents with an episode of syncope.  This is in the setting of having a fecal impaction and a hard bowel movement.  She is overall nontoxic and vital signs are largely reassuring.  She is satting 90% on room air.  No acute ischemic or arrhythmic changes on her EKG.  While chest pain in the setting of syncope is concerning, syncopal episode is highly suspicious for vasovagal etiology given that she had a hard bowel movement.  Troponin x2 negative.  Patient has not had any recent ischemic evaluation.  She does have risk factors.  Labs obtained and largely reassuring.  No significant metabolic  derangements.  No significant anemia.  I did send a urine given that her urine was very turbid.  This would be concerning for infection in the right clinical scenario; however, patient is otherwise asymptomatic and may be colonized.  We will send culture.  See clinical course above and discussion with the daughter.  I highly suspect vasovagal episode regarding the syncope.  Patient has been chest pain-free and asymptomatic while in emergency department.  Offered obs admission for cardiac monitoring versus discharge with close outpatient follow-up.  Both patient and daughter elected for discharge.  They were given cardiology follow-up.  Foley catheter was replaced.  We will follow-up with urology as needed.  Additionally, recommended starting MiraLAX given ongoing issues with constipation and impaction.  After history, exam, and medical workup I feel the patient has been appropriately medically screened and is safe for  discharge home. Pertinent diagnoses were discussed with the patient. Patient was given return precautions.  Final Clinical Impression(s) / ED Diagnoses Final diagnoses:  Vasovagal syncope  Fecal impaction ([place])  Atypical chest pain  Urinary catheter (Foley) change required    Rx / DC Orders ED Discharge Orders     None        Bransyn Adami, Barbette Hair, MD 08/03/21 [phone]

## 2021-08-03 NOTE — Discharge Instructions (Addendum)
You were seen today after an episode of passing out and chest pain.  Your work-up today is reassuring.  Your heart testing is normal.  It is important that you follow-up with cardiology as an outpatient.  Episode of passing out may have been related to your difficulties with having a bowel movement.  If you are not already on a bowel regimen, you may start MiraLAX daily.  Your Foley catheter was exchanged today as well.

## 2021-08-06 LAB — URINE CULTURE: Culture: >=100000 — AB

## 2021-08-07 NOTE — Progress Notes (Signed)
ED Antimicrobial Stewardship Positive Culture Follow Up   Hayley Fisher is an 87 y.o. female who presented to [facility] on 08/02/2021 with a chief complaint of  Chief Complaint  Patient presents with   Chest Pain    Recent Results (from the past 720 hour(s))  Urine Culture     Status: Abnormal   Collection Time: 08/03/21  2:25 AM   Specimen: Urine, Catheterized  Result Value Ref Range Status   Specimen Description URINE, CATHETERIZED  Final   Special Requests NONE  Final   Culture (A)  Final    >=100,000 COLONIES/mL KLEBSIELLA PNEUMONIAE Confirmed Extended Spectrum Beta-Lactamase Producer (ESBL).  In bloodstream infections from ESBL organisms, carbapenems are preferred over piperacillin/tazobactam. They are shown to have a lower risk of mortality. Two isolates with different morphologies were identified as the same organism.The most resistant organism was reported. Performed at [facility], [place] [address] [place], [place] [address]    Report Status 08/06/2021 FINAL  Final   Organism ID, Bacteria KLEBSIELLA PNEUMONIAE (A)  Final      Susceptibility   Klebsiella pneumoniae - MIC*    AMPICILLIN >=32 RESISTANT Resistant     CEFAZOLIN >=64 RESISTANT Resistant     CEFEPIME >=32 RESISTANT Resistant     CEFTRIAXONE >=64 RESISTANT Resistant     CIPROFLOXACIN 2 RESISTANT Resistant     GENTAMICIN >=16 RESISTANT Resistant     IMIPENEM <=0.25 SENSITIVE Sensitive     NITROFURANTOIN 128 RESISTANT Resistant     TRIMETH/SULFA >=320 RESISTANT Resistant     AMPICILLIN/SULBACTAM >=32 RESISTANT Resistant     PIP/TAZO 16 SENSITIVE Sensitive     * >=100,000 COLONIES/mL KLEBSIELLA PNEUMONIAE    [x]  Patient has grown out ESBL Klebsiella pneumoniae and was not prescribed antibiotics as infectious work up was negative. Patient had a foley in place at the time of presentation to the ED with turbid urine output. Foley was replaced. Historical culture review reveals recurrent ESBL growth; patient  is likely colonized. Given presentation EDP recommends calling patient to assess for s/s of active infection. If present, patient will need to return to the ED for IV antibiotics. If not present, patient should follow-up with PCP.  ED Provider: Okey Dupre, PharmD, BCPS 08/07/2021 10:01 AM ED Clinical Pharmacist -  [phone]

## 2021-08-08 ENCOUNTER — Emergency Department (HOSPITAL_COMMUNITY): Payer: Medicare Other

## 2021-08-08 ENCOUNTER — Observation Stay (HOSPITAL_COMMUNITY)
Admission: EM | Admit: 2021-08-08 | Discharge: 2021-08-09 | Disposition: A | Discharge status: Home / Self Care | Payer: Medicare Other | Attending: Family Medicine | Admitting: Family Medicine

## 2021-08-08 ENCOUNTER — Encounter (HOSPITAL_COMMUNITY): Payer: Self-pay | Admitting: Family Medicine

## 2021-08-08 ENCOUNTER — Other Ambulatory Visit: Payer: Self-pay

## 2021-08-08 DIAGNOSIS — E1122 Type 2 diabetes mellitus with diabetic chronic kidney disease: Secondary | ICD-10-CM | POA: Diagnosis not present

## 2021-08-08 DIAGNOSIS — N189 Chronic kidney disease, unspecified: Secondary | ICD-10-CM | POA: Diagnosis not present

## 2021-08-08 DIAGNOSIS — L089 Local infection of the skin and subcutaneous tissue, unspecified: Secondary | ICD-10-CM

## 2021-08-08 DIAGNOSIS — Z7982 Long term (current) use of aspirin: Secondary | ICD-10-CM | POA: Diagnosis not present

## 2021-08-08 DIAGNOSIS — K5900 Constipation, unspecified: Secondary | ICD-10-CM | POA: Diagnosis not present

## 2021-08-08 DIAGNOSIS — Z20822 Contact with and (suspected) exposure to covid-19: Secondary | ICD-10-CM | POA: Insufficient documentation

## 2021-08-08 DIAGNOSIS — Z8616 Personal history of COVID-19: Secondary | ICD-10-CM | POA: Diagnosis not present

## 2021-08-08 DIAGNOSIS — Z79899 Other long term (current) drug therapy: Secondary | ICD-10-CM | POA: Insufficient documentation

## 2021-08-08 DIAGNOSIS — E039 Hypothyroidism, unspecified: Secondary | ICD-10-CM | POA: Insufficient documentation

## 2021-08-08 DIAGNOSIS — L03019 Cellulitis of unspecified finger: Secondary | ICD-10-CM | POA: Diagnosis not present

## 2021-08-08 DIAGNOSIS — R404 Transient alteration of awareness: Secondary | ICD-10-CM | POA: Diagnosis not present

## 2021-08-08 DIAGNOSIS — Z794 Long term (current) use of insulin: Secondary | ICD-10-CM | POA: Insufficient documentation

## 2021-08-08 DIAGNOSIS — Z7984 Long term (current) use of oral hypoglycemic drugs: Secondary | ICD-10-CM | POA: Diagnosis not present

## 2021-08-08 DIAGNOSIS — R55 Syncope and collapse: Secondary | ICD-10-CM | POA: Diagnosis not present

## 2021-08-08 DIAGNOSIS — I517 Cardiomegaly: Secondary | ICD-10-CM | POA: Diagnosis not present

## 2021-08-08 DIAGNOSIS — I129 Hypertensive chronic kidney disease with stage 1 through stage 4 chronic kidney disease, or unspecified chronic kidney disease: Secondary | ICD-10-CM | POA: Insufficient documentation

## 2021-08-08 DIAGNOSIS — R0902 Hypoxemia: Secondary | ICD-10-CM | POA: Diagnosis not present

## 2021-08-08 DIAGNOSIS — L988 Other specified disorders of the skin and subcutaneous tissue: Secondary | ICD-10-CM | POA: Diagnosis not present

## 2021-08-08 DIAGNOSIS — R2681 Unsteadiness on feet: Secondary | ICD-10-CM | POA: Insufficient documentation

## 2021-08-08 DIAGNOSIS — M7989 Other specified soft tissue disorders: Secondary | ICD-10-CM | POA: Diagnosis not present

## 2021-08-08 DIAGNOSIS — R0602 Shortness of breath: Secondary | ICD-10-CM | POA: Diagnosis not present

## 2021-08-08 DIAGNOSIS — K7689 Other specified diseases of liver: Secondary | ICD-10-CM | POA: Diagnosis not present

## 2021-08-08 DIAGNOSIS — N281 Cyst of kidney, acquired: Secondary | ICD-10-CM | POA: Diagnosis not present

## 2021-08-08 DIAGNOSIS — J9811 Atelectasis: Secondary | ICD-10-CM | POA: Diagnosis not present

## 2021-08-08 DIAGNOSIS — I10 Essential (primary) hypertension: Secondary | ICD-10-CM | POA: Diagnosis not present

## 2021-08-08 DIAGNOSIS — R6889 Other general symptoms and signs: Secondary | ICD-10-CM | POA: Diagnosis not present

## 2021-08-08 DIAGNOSIS — Z87891 Personal history of nicotine dependence: Secondary | ICD-10-CM | POA: Insufficient documentation

## 2021-08-08 DIAGNOSIS — N2889 Other specified disorders of kidney and ureter: Secondary | ICD-10-CM | POA: Diagnosis not present

## 2021-08-08 DIAGNOSIS — Z743 Need for continuous supervision: Secondary | ICD-10-CM | POA: Diagnosis not present

## 2021-08-08 LAB — COMPREHENSIVE METABOLIC PANEL
ALT: 11 U/L (ref 0–44)
AST: 19 U/L (ref 15–41)
Albumin: 3.3 g/dL — ABNORMAL LOW (ref 3.5–5.0)
Alkaline Phosphatase: 54 U/L (ref 38–126)
Anion gap: 10 (ref 5–15)
BUN: 14 mg/dL (ref 8–23)
CO2: 25 mmol/L (ref 22–32)
Calcium: 8.8 mg/dL — ABNORMAL LOW (ref 8.9–10.3)
Chloride: 103 mmol/L (ref 98–111)
Creatinine, Ser: 0.73 mg/dL (ref 0.44–1.00)
GFR, Estimated: >60 mL/min (ref >60)
Glucose, Bld: 195 mg/dL — ABNORMAL HIGH (ref 70–99)
Potassium: 4.1 mmol/L (ref 3.5–5.1)
Sodium: 138 mmol/L (ref 135–145)
Total Bilirubin: 0.8 mg/dL (ref 0.3–1.2)
Total Protein: 6.1 g/dL — ABNORMAL LOW (ref 6.5–8.1)

## 2021-08-08 LAB — URINALYSIS, ROUTINE W REFLEX MICROSCOPIC
Bilirubin Urine: NEGATIVE
Glucose, UA: 100 mg/dL — AB
Ketones, ur: NEGATIVE mg/dL
Nitrite: POSITIVE — AB
Protein, ur: 30 mg/dL — AB
Specific Gravity, Urine: 1.01 (ref 1.005–1.030)
pH: 5.5 (ref 5.0–8.0)

## 2021-08-08 LAB — CBC WITH DIFFERENTIAL/PLATELET
Abs Immature Granulocytes: 0.03 10*3/uL (ref 0.00–0.07)
Basophils Absolute: 0 10*3/uL (ref 0.0–0.1)
Basophils Relative: 0 %
Eosinophils Absolute: 0.1 10*3/uL (ref 0.0–0.5)
Eosinophils Relative: 1 %
HCT: 36.5 % (ref 36.0–46.0)
Hemoglobin: 11.2 g/dL — ABNORMAL LOW (ref 12.0–15.0)
Immature Granulocytes: 1 %
Lymphocytes Relative: 12 %
Lymphs Abs: 0.7 10*3/uL (ref 0.7–4.0)
MCH: 31.8 pg (ref 26.0–34.0)
MCHC: 30.7 g/dL (ref 30.0–36.0)
MCV: 103.7 fL — ABNORMAL HIGH (ref 80.0–100.0)
Monocytes Absolute: 0.3 10*3/uL (ref 0.1–1.0)
Monocytes Relative: 5 %
Neutro Abs: 4.6 10*3/uL (ref 1.7–7.7)
Neutrophils Relative %: 81 %
Platelets: 146 10*3/uL — ABNORMAL LOW (ref 150–400)
RBC: 3.52 MIL/uL — ABNORMAL LOW (ref 3.87–5.11)
RDW: 13.4 % (ref 11.5–15.5)
WBC: 5.7 10*3/uL (ref 4.0–10.5)
nRBC: 0 % (ref 0.0–0.2)

## 2021-08-08 LAB — LACTIC ACID, PLASMA: Lactic Acid, Venous: 1.3 mmol/L (ref 0.5–1.9)

## 2021-08-08 LAB — URINALYSIS, MICROSCOPIC (REFLEX)
RBC / HPF: >50 RBC/hpf (ref 0–5)
WBC, UA: >50 WBC/hpf (ref 0–5)

## 2021-08-08 LAB — TSH: TSH: 5.373 u[IU]/mL — ABNORMAL HIGH (ref 0.350–4.500)

## 2021-08-08 LAB — CBG MONITORING, ED
Glucose-Capillary: 165 mg/dL — ABNORMAL HIGH (ref 70–99)
Glucose-Capillary: 260 mg/dL — ABNORMAL HIGH (ref 70–99)

## 2021-08-08 LAB — TROPONIN I (HIGH SENSITIVITY)
Troponin I (High Sensitivity): 5 ng/L (ref <18)
Troponin I (High Sensitivity): 5 ng/L (ref <18)

## 2021-08-08 LAB — MAGNESIUM: Magnesium: 1.6 mg/dL — ABNORMAL LOW (ref 1.7–2.4)

## 2021-08-08 MED ORDER — PIPERACILLIN-TAZOBACTAM 3.375 G IVPB: 3.3750 g | Freq: Three times a day (TID) | INTRAVENOUS | Status: DC

## 2021-08-08 MED ORDER — ACETAMINOPHEN 325 MG PO TABS: 650.0000 mg | ORAL_TABLET | Freq: Four times a day (QID) | ORAL | Status: DC | PRN

## 2021-08-08 MED ORDER — ENALAPRIL MALEATE 5 MG PO TABS
20.0000 mg | ORAL_TABLET | Freq: Every day | ORAL | Status: DC
  Administered 2021-08-09: 10:00:00 20 mg via ORAL
  Filled 2021-08-08: qty 1
  Filled 2021-08-08: qty 4
  Filled 2021-08-08: qty 1
  Filled 2021-08-08: qty 4
  Filled 2021-08-08: qty 1

## 2021-08-08 MED ORDER — POLYETHYLENE GLYCOL 3350 17 G PO PACK
17.0000 g | PACK | Freq: Two times a day (BID) | ORAL | Status: DC
  Filled 2021-08-08: qty 1

## 2021-08-08 MED ORDER — MAGNESIUM SULFATE 2 GM/50ML IV SOLN
2.0000 g | Freq: Once | INTRAVENOUS | Status: AC
  Administered 2021-08-08: 17:00:00 2 g via INTRAVENOUS
  Filled 2021-08-08: qty 50

## 2021-08-08 MED ORDER — ACETAMINOPHEN 650 MG RE SUPP: 650.0000 mg | Freq: Four times a day (QID) | RECTAL | Status: DC | PRN

## 2021-08-08 MED ORDER — FERROUS SULFATE 325 (65 FE) MG PO TABS
325.0000 mg | ORAL_TABLET | Freq: Every day | ORAL | Status: DC
  Administered 2021-08-09: 10:00:00 325 mg via ORAL
  Filled 2021-08-08: qty 1

## 2021-08-08 MED ORDER — INSULIN GLARGINE 100 UNITS/ML SOLOSTAR PEN: 10.0000 [IU] | PEN_INJECTOR | Freq: Every day | SUBCUTANEOUS | Status: DC

## 2021-08-08 MED ORDER — FAMOTIDINE 20 MG PO TABS
20.0000 mg | ORAL_TABLET | Freq: Every day | ORAL | Status: DC
  Administered 2021-08-09: 10:00:00 20 mg via ORAL
  Filled 2021-08-08: qty 1

## 2021-08-08 MED ORDER — LATANOPROSTENE BUNOD 0.024 % OP SOLN: 1.0000 [drp] | Freq: Every day | OPHTHALMIC | Status: DC

## 2021-08-08 MED ORDER — ENOXAPARIN SODIUM 40 MG/0.4ML IJ SOSY
40.0000 mg | PREFILLED_SYRINGE | INTRAMUSCULAR | Status: DC
  Administered 2021-08-08: 20:00:00 40 mg via SUBCUTANEOUS
  Filled 2021-08-08: qty 0.4

## 2021-08-08 MED ORDER — SENNA 8.6 MG PO TABS
1.0000 | ORAL_TABLET | Freq: Two times a day (BID) | ORAL | Status: DC
  Administered 2021-08-09: 10:00:00 8.6 mg via ORAL
  Filled 2021-08-08: qty 1

## 2021-08-08 MED ORDER — ATORVASTATIN CALCIUM 40 MG PO TABS
40.0000 mg | ORAL_TABLET | Freq: Every day | ORAL | Status: DC
  Administered 2021-08-08 – 2021-08-09 (×2): 40 mg via ORAL
  Filled 2021-08-08 (×2): qty 1

## 2021-08-08 MED ORDER — MIRABEGRON ER 25 MG PO TB24
25.0000 mg | ORAL_TABLET | Freq: Every day | ORAL | Status: DC
  Administered 2021-08-08 – 2021-08-09 (×2): 25 mg via ORAL
  Filled 2021-08-08 (×2): qty 1

## 2021-08-08 MED ORDER — PIPERACILLIN-TAZOBACTAM 3.375 G IVPB 30 MIN
3.3750 g | Freq: Once | INTRAVENOUS | Status: DC
  Filled 2021-08-08: qty 50

## 2021-08-08 MED ORDER — LORATADINE 10 MG PO TABS
10.0000 mg | ORAL_TABLET | Freq: Every day | ORAL | Status: DC
  Administered 2021-08-08 – 2021-08-09 (×2): 10 mg via ORAL
  Filled 2021-08-08 (×2): qty 1

## 2021-08-08 MED ORDER — TAMSULOSIN HCL 0.4 MG PO CAPS
0.4000 mg | ORAL_CAPSULE | Freq: Every morning | ORAL | Status: DC
  Administered 2021-08-08 – 2021-08-09 (×2): 0.4 mg via ORAL
  Filled 2021-08-08 (×2): qty 1

## 2021-08-08 MED ORDER — DULOXETINE HCL 60 MG PO CPEP
60.0000 mg | ORAL_CAPSULE | Freq: Every day | ORAL | Status: DC
  Administered 2021-08-08 – 2021-08-09 (×2): 60 mg via ORAL
  Filled 2021-08-08 (×2): qty 1

## 2021-08-08 MED ORDER — SILVER SULFADIAZINE 1 % EX CREA
TOPICAL_CREAM | Freq: Two times a day (BID) | CUTANEOUS | Status: DC
  Administered 2021-08-08 – 2021-08-09 (×2): 1 via TOPICAL
  Filled 2021-08-08: qty 85

## 2021-08-08 MED ORDER — ASPIRIN 81 MG PO CHEW
81.0000 mg | CHEWABLE_TABLET | Freq: Every morning | ORAL | Status: DC
  Administered 2021-08-08 – 2021-08-09 (×2): 81 mg via ORAL
  Filled 2021-08-08 (×2): qty 1

## 2021-08-08 MED ORDER — MILK AND MOLASSES ENEMA
1.0000 | Freq: Once | RECTAL | Status: AC
  Administered 2021-08-08: 240 mL via RECTAL
  Filled 2021-08-08: qty 240

## 2021-08-08 MED ORDER — LATANOPROST 0.005 % OP SOLN
1.0000 [drp] | Freq: Every day | OPHTHALMIC | Status: DC
  Administered 2021-08-08: 23:00:00 1 [drp] via OPHTHALMIC
  Filled 2021-08-08: qty 2.5

## 2021-08-08 MED ORDER — INSULIN GLARGINE-YFGN 100 UNIT/ML ~~LOC~~ SOLN
10.0000 [IU] | Freq: Every day | SUBCUTANEOUS | Status: DC
  Administered 2021-08-09: 10:00:00 10 [IU] via SUBCUTANEOUS
  Filled 2021-08-08: qty 0.1

## 2021-08-08 MED ORDER — DORZOLAMIDE HCL-TIMOLOL MAL 2-0.5 % OP SOLN
1.0000 [drp] | Freq: Two times a day (BID) | OPHTHALMIC | Status: DC
  Administered 2021-08-08 – 2021-08-09 (×2): 1 [drp] via OPHTHALMIC
  Filled 2021-08-08: qty 10

## 2021-08-08 MED ORDER — IOHEXOL 350 MG/ML SOLN
80.0000 mL | Freq: Once | INTRAVENOUS | Status: AC | PRN
  Administered 2021-08-08: 12:00:00 80 mL via INTRAVENOUS

## 2021-08-08 MED ORDER — BRIMONIDINE TARTRATE 0.2 % OP SOLN
1.0000 [drp] | Freq: Two times a day (BID) | OPHTHALMIC | Status: DC
  Administered 2021-08-08 – 2021-08-09 (×2): 1 [drp] via OPHTHALMIC
  Filled 2021-08-08: qty 5

## 2021-08-08 MED ORDER — LEVOTHYROXINE SODIUM 25 MCG PO TABS
50.0000 ug | ORAL_TABLET | Freq: Every day | ORAL | Status: DC
Start: 2021-08-09 — End: 2021-08-09
  Administered 2021-08-09: 10:00:00 50 ug via ORAL
  Filled 2021-08-08: qty 2

## 2021-08-08 MED ORDER — ONDANSETRON 4 MG PO TBDP: 4.0000 mg | ORAL_TABLET | Freq: Three times a day (TID) | ORAL | Status: DC | PRN

## 2021-08-08 NOTE — ED Triage Notes (Signed)
Pt BIB EMS due to a syncopal event and hypotension. Pt was last seen for this 08/02/21. Pt has dementia at baseline. Daughter is the one who called 911.

## 2021-08-08 NOTE — Progress Notes (Deleted)
Pharmacy Antibiotic Note  Hayley Fisher is a 87 y.o. female admitted on 08/08/2021 with  wound infection .  Pharmacy has been consulted for zosyn dosing.  Patient with a history of impairment, indwelling Foley catheter, DVT w/ IVC filter placed in 5/22, chronic constipation, type 2 diabetes and hypertension.   Patient with recent ED visit for syncopal event following BM. Urine culture obtained at that time with ESBL Klebsiella pneumoniae. Patient with a foley in place at the time of presentation to the ED with turbid urine output. Foley was replaced. Historical culture review reveals recurrent ESBL growth; patient is likely colonized. However, there is concern for possible osteomyelitis of finger, pending work-up.  Patient returning again today following multiple presyncopal/syncopal events today per patient's daughter.  SCr 0.73 - at baseline WBC 5.7; LA 1.3; T 96.2 F; HR 92; RR 16  Plan: Zosyn 3.375g IV q8h (4 hour infusion) Trend WBC, Fever, Renal function, & Clinical course F/u cultures, clinical course, WBC, fever De-escalate when able  Height: 5\' 2"  (157.5 cm) Weight: 59 kg (130 lb) IBW/kg (Calculated) : 50.1  Temp (24hrs), Avg:97 F (36.1 C), Min:96.2 F (35.7 C), Max:97.8 F (36.6 C)  Recent Labs  Lab 08/02/21 2127 08/08/21 1200 08/08/21 1240  WBC 5.3 5.7  --   CREATININE 1.02* 0.73  --   LATICACIDVEN  --   --  1.3    Estimated Creatinine Clearance: 38.4 mL/min (by C-G formula based on SCr of 0.73 mg/dL).    Allergies  Allergen Reactions   Tramadol Itching    Takes occasionally with benadryl   Levemir [Insulin Detemir] Itching   Antimicrobials this admission: zosyn 12/18 >>   Microbiology results: Pending  Thank you for allowing pharmacy to be a part of this patients care.  Lorelei Pont, PharmD, BCPS 08/08/2021 1:58 PM ED Clinical Pharmacist -  [phone]
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
## 2021-08-08 NOTE — ED Notes (Addendum)
Patient's daughter Cameron Sprang would like someone to call her when patient gets a bed on the floor. [phone]

## 2021-08-08 NOTE — ED Notes (Signed)
MD in to se

## 2021-08-08 NOTE — Progress Notes (Deleted)
[facility] Admission History and Physical Service Pager: [phone]  Patient name: Hayley Fisher Medical record number: [id number] Date of birth: [birth year] Age: 87 y.o. Gender: female  Primary Care Provider: [name], MD Consultants: Hand Surgery Code Status: Partial, No shocks, No intubation, Yes to CPR and ACLS meds Preferred Emergency Contact: Audie Clear [phone]  Chief Complaint: Recurrent Syncope  Assessment and Plan: Hayley Fisher is a 87 y.o. female presenting with recurrent syncope. PMH is significant for HTN, T2DM, hypothyroidism, urinary incontinence with chronic indwelling urinary catheter.   Syncope   Constipation Syncope x2 once while on toilet and once shortly after defecation.  No head trauma. No CP/SOB. HR, BP, and O2 sats within acceptable limits on arrival. Patient did become hypothermic to 96.2. ECG with sinus tachycardia, no evidence of arrhythmia or ischemic change compared to previous. Admissions labs reassuring with WBC 5.7, Lactic acid 1.3, troponin 5. UA with positive nitrates, small leukocytes, and rare bacteria, however patient has a chronic indwelling catheter. Also with diffusely thickened bladder wall on CT. However,with no symptoms so will not treat unless develops infectious symptoms. CT Head with no evidence of acute intracranial abnormality. CTA Chest neg for PE or other acute cardiopulmonary disease. Large colonic stool buden noted on CT Abd Pelvis. Is on Miralax, Senna, and Colace at home but endorses ongoing "marble"-like stool. Suspect that vasovagal response to straining for BM is the ultimate underlying reason for her syncope. If no improvement with constipation cleanout, could consider echocardiogram to further evaluate for cardiac etiology, but nothing in history or on physical exam to suggest this.  - Admit to med-surg, Dr. Owens Shark attending - Will administer milk and molasses enema  - Restart Miralax BID and  Senna BID tomorrow - Will need aggressive bowel regimen upon discharge - Follow blood and urine cultures - PT/OT - Cardiac monitoring - Regular diet - Consider echo if no improvement with constipation cleanout  Wound/Burn on R index finger Burn on R index finger appears to be superficial, partial thickness burn. XR with diffuse soft tissue swelling and potential subcutaneous emphysema involving the webspace between the second and third digits. No discrete area of osteolysis to suggest osteomyelitis. Discussed case with Dr. Tempie Donning, hand surgery, who did not feel that this warranted further investigation on an inpatient basis. Do not have concern for systemic infection.  - Wound care consult - Outpatient follow-up with Dr. Tempie Donning  Hypothermia Temp 96.2>96.7.  She attributes this to having been outside and "just her nightgown" with EMS.  Denies fevers or chills.  Vitals otherwise stable, do not suspect this represents systemic inflammation/sepsis. -Bair hugger until temperature normalizes  Hypertension BP 130s-160s/80s-100s.  -Continue home enalapril 20 mg daily  Anemia Hgb 11.2, which seems to be around her baseline of 10.5-11.5.  -Continue ferrous sulfate 325 daily  Hypomagnesemia Magnesium 1.6.  - Will replete with 2g IV   Urinary Incontinence  Per chart review, patient has had indwelling Foley since July of this year.  Follows with urology. Patient has a history of ESBL colonization identified during hospitalization last month.  He is on Myrbetriq and Flomax at home despite having an indwelling urinary catheter. -Need to clarify whether she needs Myrbetriq and Flomax given that she has catheter -Continue home meds for now  Hypothyroidism TSH 5.373.  Patient takes 50 mcg of levothyroxine daily. -Continue levothyroxine  T2DM Last A1c 8.0 2 months ago.  Home regimen is metformin 1 g daily and Lantus 10 units daily.   -Monitor  CBGs -Continue home LAI -Hold  metformin  Glaucoma -Continue home latanoprostene, brimonidine, and Cosopt drops  Hyperlipidemia - Continue home atorvastatin 40mg  daily  FEN/GI: Regular diet Prophylaxis: Lovenox  Disposition: Med-Surg  History of Present Illness:  Hayley Fisher is a 87 y.o. female presenting with syncope.  She had 1 episode of syncope 5 days ago while having a bowel movement.  She had 2 other syncopal episodes today once while on the toilet and 1 shortly after getting up after having a bowel movement.  She denies any palpitations, shortness of breath, chest pain with these episodes.  She denies ever hitting her head during these episodes.  She feels otherwise like her self.  Does not have any recent fevers, chills, other signs or symptoms of infection.  She deals with constant constipation and is on MiraLAX, senna, Colace daily at home but still says that her stools are like "marbles."  She also complains of a burn on her right index finger.  She does not recall an inciting injury but says that the burn has been present for about a week.  She says that she drinks hot tea and that she redoes rested that part of the finger on her hot mug and thinks that this may be how she injured herself.  The surrounding area is not painful for her.  Review Of Systems: Per HPI with the following additions:   Review of Systems  Constitutional:  Negative for chills and fever.  Gastrointestinal:  Positive for constipation. Negative for abdominal distention, abdominal pain, anal bleeding, blood in stool, diarrhea, nausea and vomiting.  Genitourinary:  Negative for flank pain, pelvic pain and vaginal pain.  Neurological:  Negative for headaches.  All other systems reviewed and are negative.   Patient Active Problem List   Diagnosis Date Noted   Incontinence 07/14/2021   Pressure injury of right buttock, stage 2 ([place]) 07/14/2021   COVID-19 virus infection 06/27/2021   Normocytic anemia 06/27/2021   Leukocytopenia  06/27/2021   Asymptomatic bacteriuria 06/27/2021   Malnutrition of moderate degree 02/23/2021   AMS (altered mental status) 02/21/2021   Cognitive impairment 98/33/8250   Acute metabolic encephalopathy 53/97/6734   Sepsis ([place]) 01/15/2021   Acute encephalopathy 12/09/2020   Hyperbilirubinemia 12/09/2020   Hypothyroidism 12/09/2020   Memory changes 10/20/2020   Acute on chronic anemia 09/21/2020   Gastroesophageal reflux disease without esophagitis 09/16/2020   Urinary frequency 09/11/2020   Stage 1 decubitus ulcer 07/30/2020   Uncontrolled type 2 diabetes mellitus with hyperglycemia ([place]) 07/02/2020   Unstable gait 04/03/2018   Frequent falls 04/03/2018   Age-related physical debility 03/22/2018   Hematuria 01/29/2018   Skin ulcer of sacrum, limited to breakdown of skin ([place]) 03/16/2017   Spinal stenosis of lumbar region 01/13/2016   Chronic venous insufficiency 11/09/2015   DDD (degenerative disc disease), lumbosacral 11/24/2014   Constipation by delayed colonic transit 11/24/2014   Urinary tract infection without hematuria 07/03/2014   Frequent UTI 10/29/2012   Pruritus 03/17/2011   Type 2 diabetes mellitus with diabetic neuropathy ([place]) 10/19/2006   Hyperlipidemia associated with type 2 diabetes mellitus ([place]) 10/19/2006   Hypertension associated with diabetes ([place]) 10/19/2006   Osteopenia 10/19/2006    Past Medical History: Past Medical History:  Diagnosis Date   Arthritis    Arthritis    Cataract    bil cateracts removed   Chronic kidney disease    "spot on one of my kidneys" per pt   Diabetes mellitus    Glaucoma  Hyperkalemia 07/02/2020   Hyperlipidemia    Hyperplastic colon polyp    Hypertension    Hypothyroidism 5/36/6440   Metabolic acidosis 34/74/2595   Syncope    Thyroid disease     Past Surgical History: Past Surgical History:  Procedure Laterality Date   ABDOMINAL HYSTERECTOMY     BLADDER SUSPENSION     bladder tacking     COLONOSCOPY      EYE SURGERY     INGUINAL HERNIA REPAIR Right    IR IVC FILTER PLMT / S&I /IMG GUID/MOD SED  01/16/2021    Social History: Social History   Tobacco Use   Smoking status: Former    Types: Cigarettes    Quit date: 11/29/1980    Years since quitting: 40.7   Smokeless tobacco: Never  Vaping Use   Vaping Use: Never used  Substance Use Topics   Alcohol use: No    Alcohol/week: 0.0 standard drinks   Drug use: No    Family History: Family History  Problem Relation Age of Onset   Colon cancer Mother    Other Father        cerebral hemorrhage   Diabetes Brother    Diabetes Sister        x 2   Bone cancer Daughter    Breast cancer Daughter 42   Esophageal cancer Neg Hx    Rectal cancer Neg Hx    Stomach cancer Neg Hx      Allergies and Medications: Allergies  Allergen Reactions   Tramadol Itching    Takes occasionally with benadryl   Levemir [Insulin Detemir] Itching   No current facility-administered medications on file prior to encounter.   Current Outpatient Medications on File Prior to Encounter  Medication Sig Dispense Refill   Accu-Chek Softclix Lancets lancets 1 each by Other route See admin instructions. USE TO TEST BLOOD SUGAR UP  TO 3 TIMES A DAY.     aspirin 81 MG chewable tablet Chew 81 mg by mouth every morning.     atorvastatin (LIPITOR) 40 MG tablet TAKE 1 TABLET BY MOUTH EVERY DAY (Patient taking differently: Take 40 mg by mouth daily.) 90 tablet 3   brimonidine (ALPHAGAN) 0.2 % ophthalmic solution Place 1 drop into the left eye 2 (two) times daily.     carbamide peroxide (DEBROX) 6.5 % OTIC solution Place 2 drops into both ears 2 (two) times daily as needed (ear wax).     Catheter Self-Adhesive Urinary MISC 1 patch by Does not apply route daily. 12 each 0   cetirizine (ZYRTEC) 10 MG tablet Take 10 mg by mouth daily as needed for allergies.     clotrimazole-betamethasone (LOTRISONE) cream Apply 1 application topically 2 (two) times daily as needed  (rash/itching).     diphenhydrAMINE (BENADRYL) 25 MG tablet Take 25 mg by mouth every 6 (six) hours as needed for itching (with each dose of tramadol).     docusate sodium (COLACE) 100 MG capsule Take 1 capsule (100 mg total) by mouth 2 (two) times daily. (Patient taking differently: Take 100 mg by mouth every morning.) 10 capsule 0   dorzolamide-timolol (COSOPT) 22.3-6.8 MG/ML ophthalmic solution Place 1 drop into both eyes 2 (two) times daily.     DULoxetine (CYMBALTA) 60 MG capsule Take 1 capsule (60 mg total) by mouth daily.     enalapril (VASOTEC) 20 MG tablet Take 1 tablet (20 mg total) by mouth daily. 90 tablet 3   famotidine (PEPCID) 20 MG tablet Take 1  tablet (20 mg total) by mouth daily.     ferrous sulfate 325 (65 FE) MG tablet Take 325 mg by mouth daily with breakfast.     glucose blood (ACCU-CHEK AVIVA PLUS) test strip 1 each by Other route See admin instructions. USE TO TEST BLOOD SUGAR UP  TO 3 TIMES A DAY.     hydrocortisone (ANUSOL-HC) 2.5 % rectal cream Place 1 application rectally 2 (two) times daily as needed for hemorrhoids or anal itching. Please use twice daily x5 days, and then twice daily as needed. 30 g 0   hydrOXYzine (ATARAX/VISTARIL) 10 MG tablet Take 10 mg by mouth every 4 (four) hours as needed for itching.     insulin glargine (LANTUS) 100 unit/mL SOPN Inject 10 Units into the skin daily. (Patient taking differently: Inject 10 Units into the skin daily before breakfast.) 15 mL 1   Insulin Pen Needle 32G X 4 MM MISC Use as directed with insulin 100 each 2   Insulin Syringe-Needle U-100 (SURE COMFORT INSULIN SYRINGE) 31G X 5/16" 0.3 ML MISC Inject 1 Syringe into the skin daily.     ketoconazole (NIZORAL) 2 % cream Apply 1 application topically daily as needed for irritation.     Latanoprostene Bunod (VYZULTA) 0.024 % SOLN Place 1 drop into both eyes at bedtime.     levothyroxine (SYNTHROID) 50 MCG tablet Take 1 tablet (50 mcg total) by mouth daily. 90 tablet 3    metFORMIN (GLUCOPHAGE-XR) 500 MG 24 hr tablet Take 2 tablets (1,000 mg total) by mouth daily with breakfast. 180 tablet 3   Misc. Devices (TRANSFER BOARD) MISC Use as needed for transfer from bed to chair or vice versa 1 each 0   Multiple Vitamins-Minerals (PRESERVISION AREDS 2+MULTI VIT) CAPS Take 1 capsule by mouth daily.     MYRBETRIQ 25 MG TB24 tablet TAKE 1 TABLET BY MOUTH DAILY (Patient taking differently: Take 25 mg by mouth every morning.) 30 tablet 1   ondansetron (ZOFRAN ODT) 4 MG disintegrating tablet Take 1 tablet (4 mg total) by mouth every 8 (eight) hours as needed for nausea. 10 tablet 0   polyethylene glycol powder (GLYCOLAX/MIRALAX) 17 GM/SCOOP powder Take 17 g by mouth daily. (Patient taking differently: Take 17 g by mouth daily as needed (constipation).) 255 g 0   SARNA lotion Apply 1 application topically 4 (four) times daily as needed for itching.     Semaglutide (RYBELSUS) 3 MG TABS Take 3 mg by mouth daily. 30 tablet 11   senna (SENOKOT) 8.6 MG TABS tablet Take 1 tablet by mouth every morning.     tamsulosin (FLOMAX) 0.4 MG CAPS capsule Take 0.4 mg by mouth every morning.     traMADol (ULTRAM) 50 MG tablet Take 50 mg by mouth every 6 (six) hours as needed for severe pain.     witch hazel-glycerin (TUCKS) pad Apply 1 application topically 2 (two) times daily as needed for itching.      Objective: BP (!) 150/84    Pulse 100    Temp (!) 96.2 F (35.7 C) (Rectal)    Resp 15    Ht 5\' 2"  (1.575 m)    Wt 59 kg    SpO2 100%    BMI 23.78 kg/m  Exam: General: Frail elderly female, NAD Eyes: EOMs are intact, conjunctiva without injection ENTM: Mucous membranes are moist Cardiovascular: Regular rate, regular rhythm, no murmur Respiratory: Normal work of breathing on room air, lungs are clear to auscultation throughout Gastrointestinal: Abdomen soft, nontender,  nondistended, palpable stool in left side colon MSK: Trace edema of bilateral feet, nontender, no deformity Derm: Erosive  lesion on lateral aspect of right index finger with granulation tissue around border.  Diffuse swelling of the digit without warmth, erythema, tenderness.  See photo below. Neuro: Without focal deficit Psych: Mood and affect normal     Labs and Imaging: CBC BMET  Recent Labs  Lab 08/08/21 1200  WBC 5.7  HGB 11.2*  HCT 36.5  PLT 146*   Recent Labs  Lab 08/08/21 1200  NA 138  K 4.1  CL 103  CO2 25  BUN 14  CREATININE 0.73  GLUCOSE 195*  CALCIUM 8.8*     EKG: Sinus tachycardia, no ST elevation/depression; QTC 454  DG Finger Index Right CLINICAL DATA:  Concern for infection.  EXAM: RIGHT INDEX FINGER 2+V  COMPARISON:  None.  FINDINGS: Diffuse soft tissue swelling about the index finger with potential minimal amount of subcutaneous emphysema involving the webspace between the second and third digits. No radiopaque foreign body. No discrete areas of osteolysis suggest osteomyelitis.  No fracture or dislocation. Joint spaces appear preserved given obliquity. No discrete erosions.  IMPRESSION: Diffuse soft tissue swelling about the index finger with potential subcutaneous emphysema involving the webspace between the second and third digits without discrete area of osteolysis to suggest osteomyelitis. Further evaluation with MRI could be performed as indicated.  Electronically Signed   By: Sandi Mariscal M.D.   On: 08/08/2021 13:24 CT ABDOMEN PELVIS W CONTRAST CLINICAL DATA:  Concern for bowel obstruction.  Syncopal episode.  EXAM: CT ABDOMEN AND PELVIS WITH CONTRAST  TECHNIQUE: Multidetector CT imaging of the abdomen and pelvis was performed using the standard protocol following bolus administration of intravenous contrast.  CONTRAST:  71mL OMNIPAQUE IOHEXOL 350 MG/ML SOLN  COMPARISON:  CT abdomen pelvis-07/27/2020  FINDINGS: Lower chest: Limited visualization of the lower thorax demonstrates minimal bibasilar subsegmental atelectasis, left greater than  right. No discrete focal airspace opacities. No pleural effusion.  Cardiomegaly.  Coronary artery calcifications.  Hepatobiliary: Normal hepatic contour. There is diffuse decreased attenuation hepatic parenchyma suggestive of hepatic steatosis. No discrete hepatic lesions. Normal appearance of the gallbladder given degree distention. No radiopaque gallstones. No intra or extrahepatic biliary duct dilatation. No ascites.  Pancreas: Normal appearance of the pancreas.  Spleen: The spleen appears somewhat diminutive but otherwise normal in appearance.  Adrenals/Urinary Tract: There is symmetric enhancement and excretion of the bilateral kidneys. Note is made of an approximally 7.8 x 5.9 cm hypoattenuating (16 Hounsfield unit) partially exophytic left-sided renal cyst (representative image 18, series 10). No discrete right-sided renal lesions. No evidence of nephrolithiasis on this postcontrast examination. No urinary obstruction or perinephric stranding.  Normal appearance the bilateral adrenal glands.  The urinary bladder is decompressed with a Foley catheter though appears diffusely thick wall.  Stomach/Bowel: Large colonic stool burden without evidence of enteric obstruction. No discrete areas of bowel wall thickening. No significant hiatal hernia. No pneumoperitoneum, pneumatosis or portal venous gas. The appendix is not visualized, however there is no pericecal inflammatory change. No definitive definable/drainable fluid collection within the abdomen or pelvis.  Vascular/Lymphatic: There is a moderate to large amount of predominantly calcified atherosclerotic plaque within normal caliber abdominal aorta, not resulting in hemodynamically significant stenosis. The major branch vessels of the abdominal aorta appear patent on this non CTA examination. Extensive calcified plaque involves the origin of the bilateral renal arteries, incompletely evaluated. Note is made of an  infrarenal IVC filter. Scratch the  no bulky retroperitoneal, mesenteric, pelvic or inguinal lymphadenopathy.  Reproductive: Post hysterectomy. No discrete adnexal lesions. No free fluid within the pelvic cul-de-sac.  Other: Diffuse body wall anasarca.  Musculoskeletal: No acute or aggressive osseous abnormalities. Moderate to severe multilevel lumbar spine DDD, worse at L1-L2, L4-L5 and L5-S1 with disc space height loss, endplate irregularity and sclerosis. Grade 1 anterolisthesis of L3 upon L4 without associated pars defects.  IMPRESSION: 1. No acute findings within the abdomen or pelvis. 2. Large colonic stool burden without evidence of enteric obstruction. 3. The urinary bladder is decompressed with a Foley catheter though appears diffusely thick walled. Correlation with urinalysis is advised. No evidence of urinary obstruction. 4. Suspected hepatic steatosis. 5.  Aortic Atherosclerosis (ICD10-I70.0).  Electronically Signed   By: Sandi Mariscal M.D.   On: 08/08/2021 12:16 CT Angio Chest PE W and/or Wo Contrast CLINICAL DATA:  Syncopal episode. Shortness of breath. Concern for pulmonary embolism.  EXAM: CT ANGIOGRAPHY CHEST WITH CONTRAST  TECHNIQUE: Multidetector CT imaging of the chest was performed using the standard protocol during bolus administration of intravenous contrast. Multiplanar CT image reconstructions and MIPs were obtained to evaluate the vascular anatomy.  CONTRAST:  71mL OMNIPAQUE IOHEXOL 350 MG/ML SOLN  COMPARISON:  CT abdomen and pelvis-09/28/2019  FINDINGS: Vascular Findings:  There is adequate opacification of the pulmonary arterial system with the main pulmonary artery measuring 420 Hounsfield units. There are no discrete filling defects within the pulmonary arterial tree to suggest pulmonary embolism. Normal caliber of the main pulmonary artery.  Cardiomegaly. No pericardial effusion, though a small amount of fluid is seen with the  pericardial recess. Coronary artery calcifications. Calcifications involving the aortic valve leaflets.  Scattered atherosclerotic plaque within a normal caliber thoracic aorta. No evidence of thoracic aortic dissection or perivascular stranding on this nongated examination.  Bovine configuration of the aortic arch. The branch vessels of the aortic arch appear patent throughout their imaged courses.  Review of the MIP images confirms the above findings.  ----------------------------------------------------------------------------------  Nonvascular Findings:  Mediastinum/Lymph Nodes: No bulky mediastinal, hilar or axillary lymphadenopathy.  Lungs/Pleura: Minimal dependent bibasilar subsegmental atelectasis. No discrete focal airspace opacities. No pleural effusion or pneumothorax. The central pulmonary airways appear patent. No discrete pulmonary nodules.  Upper abdomen: Limited early arterial phase evaluation of the upper abdomen suggest a large colonic stool burden.  Musculoskeletal: Diffuse body wall anasarca. No acute or aggressive osseous abnormalities. Mild multilevel thoracic spine DDD.  IMPRESSION: 1. No acute cardiopulmonary disease. Specifically, no evidence of pulmonary embolism. 2. Cardiomegaly. 3. Coronary artery calcifications. Aortic Atherosclerosis (ICD10-I70.0).  Electronically Signed   By: Sandi Mariscal M.D.   On: 08/08/2021 12:09 CT Head Wo Contrast CLINICAL DATA:  Syncope  EXAM: CT HEAD WITHOUT CONTRAST  TECHNIQUE: Contiguous axial images were obtained from the base of the skull through the vertex without intravenous contrast.  COMPARISON:  CT examination dated February 20, 2021  FINDINGS: Brain: No evidence of acute infarction, hemorrhage, hydrocephalus, extra-axial collection or mass lesion/mass effect. Dystrophic calcification bilateral basal ganglia. Diffuse low-attenuation of the periventricular white matter chronic microvascular  ischemic changes are unchanged. Empty sella, unchanged.  Vascular: Atherosclerotic calcification of bilateral carotid siphons and vertebral arteries. No hyperdense vessel or unexpected calcification.  Skull: Normal. Negative for fracture or focal lesion.  Sinuses/Orbits: Bilateral cataract surgery. Paranasal sinuses are clear.  Other: None.  IMPRESSION: No CT evidence of acute intracranial abnormality.  Additional chronic findings as above and unchanged.  Electronically Signed   By: Judye Bos.O.  On: 08/08/2021 12:06    Eppie Gibson, MD 08/08/2021, 1:14 PM PGY-1, [place] Intern pager: [phone], text pages welcome

## 2021-08-08 NOTE — H&P (Addendum)
[facility] Admission History and Physical Service Pager: [phone]  Patient name: Hayley Fisher Medical record number: [id number] Date of birth: 1931-05-20 Age: 87 y.o. Gender: female  Primary Care Provider: [name], MD Consultants: Hand Surgery Code Status: Partial, No shocks, No intubation, Yes to CPR and ACLS meds Preferred Emergency Contact: Audie Clear [phone]  Chief Complaint: Recurrent Syncope  Assessment and Plan: Hayley Fisher is a 87 y.o. female presenting with recurrent syncope. PMH is significant for HTN, T2DM, hypothyroidism, urinary incontinence with chronic indwelling urinary catheter.   Syncope   Constipation Syncope x2 once while on toilet and once shortly after defecation.  No head trauma. No CP/SOB. HR, BP, and O2 sats within acceptable limits on arrival. Patient did become hypothermic to 96.2. ECG with sinus tachycardia, no evidence of arrhythmia or ischemic change compared to previous. Admissions labs reassuring with WBC 5.7, Lactic acid 1.3, troponin 5. UA with positive nitrates, small leukocytes, and rare bacteria, however patient has a chronic indwelling catheter. Also with diffusely thickened bladder wall on CT. However,with no symptoms so will not treat unless develops infectious symptoms. CT Head with no evidence of acute intracranial abnormality. CTA Chest neg for PE or other acute cardiopulmonary disease. Large colonic stool buden noted on CT Abd Pelvis. Is on Miralax, Senna, and Colace at home but endorses ongoing "marble"-like stool. Suspect that vasovagal response to straining for BM is the ultimate underlying reason for her syncope. If no improvement with constipation cleanout, could consider echocardiogram to further evaluate for cardiac etiology, but nothing in history or on physical exam to suggest this.  - Admit to med-surg, Dr. Owens Shark attending - Will administer milk and molasses enema  - Restart Miralax BID and  Senna BID tomorrow - Will need aggressive bowel regimen upon discharge - Follow blood and urine cultures - PT/OT - Cardiac monitoring - Regular diet - Consider echo if no improvement with constipation cleanout  Wound/Burn on R index finger Burn on R index finger appears to be superficial, partial thickness burn. XR with diffuse soft tissue swelling and potential subcutaneous emphysema involving the webspace between the second and third digits. No discrete area of osteolysis to suggest osteomyelitis. Discussed case with Dr. Tempie Donning, hand surgery, who did not feel that this warranted further investigation on an inpatient basis. Do not have concern for systemic infection.  - Wound care consult - Outpatient follow-up with Dr. Tempie Donning  Hypothermia Temp 96.2>96.7.  She attributes this to having been outside and "just her nightgown" with EMS.  Denies fevers or chills.  Vitals otherwise stable, do not suspect this represents systemic inflammation/sepsis. -Bair hugger until temperature normalizes  Hypertension BP 130s-160s/80s-100s.  -Continue home enalapril 20 mg daily  Anemia Hgb 11.2, which seems to be around her baseline of 10.5-11.5.  -Continue ferrous sulfate 325 daily  Hypomagnesemia Magnesium 1.6.  - Will replete with 2g IV   Urinary Incontinence  Per chart review, patient has had indwelling Foley since July of this year.  Follows with urology. Patient has a history of ESBL colonization identified during hospitalization last month.  She is taking Myrbetriq and Flomax at home despite having an indwelling urinary catheter. -Need to clarify whether she needs Myrbetriq and Flomax given that she has catheter - If she begins to show signs or symptoms of urinary tract infection plan on removal and replacement of indwelling Foley catheter along with initiation of empiric UTI treatment -Continue home meds for now  Hypothyroidism TSH 5.373.  Patient takes 50 mcg  of levothyroxine  daily. -Continue levothyroxine  T2DM Last A1c 8.0 2 months ago.  Home regimen is metformin 1 g daily and Lantus 10 units daily.   -Monitor CBGs -Continue home LAI -Hold metformin  Glaucoma -Continue home latanoprostene, brimonidine, and Cosopt drops  Hyperlipidemia - Continue home atorvastatin 40mg  daily  FEN/GI: Regular diet Prophylaxis: Lovenox  Disposition: Med-Surg  History of Present Illness:  Hayley Fisher is a 87 y.o. female presenting with syncope.  She had 1 episode of syncope 5 days ago while having a bowel movement.  She had 2 other syncopal episodes today once while on the toilet and 1 shortly after getting up after having a bowel movement.  She denies any palpitations, shortness of breath, chest pain with these episodes.  She denies ever hitting her head during these episodes.  She feels otherwise like her self.  Does not have any recent fevers, chills, other signs or symptoms of infection.  She deals with constant constipation and is on MiraLAX, senna, Colace daily at home but still says that her stools are like "marbles."  She also complains of a burn on her right index finger.  She does not recall an inciting injury but says that the burn has been present for about a week.  She says that she drinks hot tea and that she redoes rested that part of the finger on her hot mug and thinks that this may be how she injured herself.  The surrounding area is not painful for her.  Review Of Systems: Per HPI with the following additions:   Review of Systems  Constitutional:  Negative for chills and fever.  Gastrointestinal:  Positive for constipation. Negative for abdominal distention, abdominal pain, anal bleeding, blood in stool, diarrhea, nausea and vomiting.  Genitourinary:  Negative for flank pain, pelvic pain and vaginal pain.  Neurological:  Negative for headaches.  All other systems reviewed and are negative.   Patient Active Problem List   Diagnosis Date Noted    Incontinence 07/14/2021   Pressure injury of right buttock, stage 2 ([place]) 07/14/2021   COVID-19 virus infection 06/27/2021   Normocytic anemia 06/27/2021   Leukocytopenia 06/27/2021   Asymptomatic bacteriuria 06/27/2021   Malnutrition of moderate degree 02/23/2021   AMS (altered mental status) 02/21/2021   Cognitive impairment 67/89/3810   Acute metabolic encephalopathy 17/51/0258   Sepsis ([place]) 01/15/2021   Acute encephalopathy 12/09/2020   Hyperbilirubinemia 12/09/2020   Hypothyroidism 12/09/2020   Memory changes 10/20/2020   Acute on chronic anemia 09/21/2020   Gastroesophageal reflux disease without esophagitis 09/16/2020   Urinary frequency 09/11/2020   Stage 1 decubitus ulcer 07/30/2020   Uncontrolled type 2 diabetes mellitus with hyperglycemia ([place]) 07/02/2020   Unstable gait 04/03/2018   Frequent falls 04/03/2018   Age-related physical debility 03/22/2018   Hematuria 01/29/2018   Skin ulcer of sacrum, limited to breakdown of skin ([place]) 03/16/2017   Spinal stenosis of lumbar region 01/13/2016   Chronic venous insufficiency 11/09/2015   DDD (degenerative disc disease), lumbosacral 11/24/2014   Constipation by delayed colonic transit 11/24/2014   Urinary tract infection without hematuria 07/03/2014   Frequent UTI 10/29/2012   Pruritus 03/17/2011   Type 2 diabetes mellitus with diabetic neuropathy ([place]) 10/19/2006   Hyperlipidemia associated with type 2 diabetes mellitus ([place]) 10/19/2006   Hypertension associated with diabetes ([place]) 10/19/2006   Osteopenia 10/19/2006    Past Medical History: Past Medical History:  Diagnosis Date   Arthritis    Arthritis    Cataract  bil cateracts removed   Chronic kidney disease    "spot on one of my kidneys" per pt   Diabetes mellitus    Glaucoma    Hyperkalemia 07/02/2020   Hyperlipidemia    Hyperplastic colon polyp    Hypertension    Hypothyroidism 1/75/1025   Metabolic acidosis 85/27/7824   Syncope    Thyroid disease      Past Surgical History: Past Surgical History:  Procedure Laterality Date   ABDOMINAL HYSTERECTOMY     BLADDER SUSPENSION     bladder tacking     COLONOSCOPY     EYE SURGERY     INGUINAL HERNIA REPAIR Right    IR IVC FILTER PLMT / S&I /IMG GUID/MOD SED  01/16/2021    Social History: Social History   Tobacco Use   Smoking status: Former    Types: Cigarettes    Quit date: 11/29/1980    Years since quitting: 40.7   Smokeless tobacco: Never  Vaping Use   Vaping Use: Never used  Substance Use Topics   Alcohol use: No    Alcohol/week: 0.0 standard drinks   Drug use: No    Family History: Family History  Problem Relation Age of Onset   Colon cancer Mother    Other Father        cerebral hemorrhage   Diabetes Brother    Diabetes Sister        x 2   Bone cancer Daughter    Breast cancer Daughter 59   Esophageal cancer Neg Hx    Rectal cancer Neg Hx    Stomach cancer Neg Hx      Allergies and Medications: Allergies  Allergen Reactions   Tramadol Itching    Takes occasionally with benadryl   Levemir [Insulin Detemir] Itching   No current facility-administered medications on file prior to encounter.   Current Outpatient Medications on File Prior to Encounter  Medication Sig Dispense Refill   Accu-Chek Softclix Lancets lancets 1 each by Other route See admin instructions. USE TO TEST BLOOD SUGAR UP  TO 3 TIMES A DAY.     aspirin 81 MG chewable tablet Chew 81 mg by mouth every morning.     atorvastatin (LIPITOR) 40 MG tablet TAKE 1 TABLET BY MOUTH EVERY DAY (Patient taking differently: Take 40 mg by mouth daily.) 90 tablet 3   brimonidine (ALPHAGAN) 0.2 % ophthalmic solution Place 1 drop into the left eye 2 (two) times daily.     carbamide peroxide (DEBROX) 6.5 % OTIC solution Place 2 drops into both ears 2 (two) times daily as needed (ear wax).     Catheter Self-Adhesive Urinary MISC 1 patch by Does not apply route daily. 12 each 0   cetirizine (ZYRTEC) 10 MG  tablet Take 10 mg by mouth daily as needed for allergies.     clotrimazole-betamethasone (LOTRISONE) cream Apply 1 application topically 2 (two) times daily as needed (rash/itching).     diphenhydrAMINE (BENADRYL) 25 MG tablet Take 25 mg by mouth every 6 (six) hours as needed for itching (with each dose of tramadol).     docusate sodium (COLACE) 100 MG capsule Take 1 capsule (100 mg total) by mouth 2 (two) times daily. (Patient taking differently: Take 100 mg by mouth every morning.) 10 capsule 0   dorzolamide-timolol (COSOPT) 22.3-6.8 MG/ML ophthalmic solution Place 1 drop into both eyes 2 (two) times daily.     DULoxetine (CYMBALTA) 60 MG capsule Take 1 capsule (60 mg total) by mouth daily.  enalapril (VASOTEC) 20 MG tablet Take 1 tablet (20 mg total) by mouth daily. 90 tablet 3   famotidine (PEPCID) 20 MG tablet Take 1 tablet (20 mg total) by mouth daily.     ferrous sulfate 325 (65 FE) MG tablet Take 325 mg by mouth daily with breakfast.     glucose blood (ACCU-CHEK AVIVA PLUS) test strip 1 each by Other route See admin instructions. USE TO TEST BLOOD SUGAR UP  TO 3 TIMES A DAY.     hydrocortisone (ANUSOL-HC) 2.5 % rectal cream Place 1 application rectally 2 (two) times daily as needed for hemorrhoids or anal itching. Please use twice daily x5 days, and then twice daily as needed. 30 g 0   hydrOXYzine (ATARAX/VISTARIL) 10 MG tablet Take 10 mg by mouth every 4 (four) hours as needed for itching.     insulin glargine (LANTUS) 100 unit/mL SOPN Inject 10 Units into the skin daily. (Patient taking differently: Inject 10 Units into the skin daily before breakfast.) 15 mL 1   Insulin Pen Needle 32G X 4 MM MISC Use as directed with insulin 100 each 2   Insulin Syringe-Needle U-100 (SURE COMFORT INSULIN SYRINGE) 31G X 5/16" 0.3 ML MISC Inject 1 Syringe into the skin daily.     ketoconazole (NIZORAL) 2 % cream Apply 1 application topically daily as needed for irritation.     Latanoprostene Bunod  (VYZULTA) 0.024 % SOLN Place 1 drop into both eyes at bedtime.     levothyroxine (SYNTHROID) 50 MCG tablet Take 1 tablet (50 mcg total) by mouth daily. 90 tablet 3   metFORMIN (GLUCOPHAGE-XR) 500 MG 24 hr tablet Take 2 tablets (1,000 mg total) by mouth daily with breakfast. 180 tablet 3   Misc. Devices (TRANSFER BOARD) MISC Use as needed for transfer from bed to chair or vice versa 1 each 0   Multiple Vitamins-Minerals (PRESERVISION AREDS 2+MULTI VIT) CAPS Take 1 capsule by mouth daily.     MYRBETRIQ 25 MG TB24 tablet TAKE 1 TABLET BY MOUTH DAILY (Patient taking differently: Take 25 mg by mouth every morning.) 30 tablet 1   ondansetron (ZOFRAN ODT) 4 MG disintegrating tablet Take 1 tablet (4 mg total) by mouth every 8 (eight) hours as needed for nausea. 10 tablet 0   polyethylene glycol powder (GLYCOLAX/MIRALAX) 17 GM/SCOOP powder Take 17 g by mouth daily. (Patient taking differently: Take 17 g by mouth daily as needed (constipation).) 255 g 0   SARNA lotion Apply 1 application topically 4 (four) times daily as needed for itching.     Semaglutide (RYBELSUS) 3 MG TABS Take 3 mg by mouth daily. 30 tablet 11   senna (SENOKOT) 8.6 MG TABS tablet Take 1 tablet by mouth every morning.     tamsulosin (FLOMAX) 0.4 MG CAPS capsule Take 0.4 mg by mouth every morning.     traMADol (ULTRAM) 50 MG tablet Take 50 mg by mouth every 6 (six) hours as needed for severe pain.     witch hazel-glycerin (TUCKS) pad Apply 1 application topically 2 (two) times daily as needed for itching.      Objective: BP (!) 150/84    Pulse 100    Temp (!) 96.2 F (35.7 C) (Rectal)    Resp 15    Ht 5\' 2"  (1.575 m)    Wt 59 kg    SpO2 100%    BMI 23.78 kg/m  Exam: General: Frail elderly female, NAD Eyes: EOMs are intact, conjunctiva without injection ENTM: Mucous membranes are  moist Cardiovascular: Regular rate, regular rhythm, no murmur Respiratory: Normal work of breathing on room air, lungs are clear to auscultation  throughout Gastrointestinal: Abdomen soft, nontender, nondistended, palpable stool in left side colon MSK: Trace edema of bilateral feet, nontender, no deformity Derm: Erosive lesion on lateral aspect of right index finger with granulation tissue around border.  Diffuse swelling of the digit without warmth, erythema, tenderness.  See photo below. Neuro: Without focal deficit Psych: Mood and affect normal     Labs and Imaging: CBC BMET  Recent Labs  Lab 08/08/21 1200  WBC 5.7  HGB 11.2*  HCT 36.5  PLT 146*   Recent Labs  Lab 08/08/21 1200  NA 138  K 4.1  CL 103  CO2 25  BUN 14  CREATININE 0.73  GLUCOSE 195*  CALCIUM 8.8*     EKG: Sinus tachycardia, no ST elevation/depression; QTC 454  DG Finger Index Right CLINICAL DATA:  Concern for infection.  EXAM: RIGHT INDEX FINGER 2+V  COMPARISON:  None.  FINDINGS: Diffuse soft tissue swelling about the index finger with potential minimal amount of subcutaneous emphysema involving the webspace between the second and third digits. No radiopaque foreign body. No discrete areas of osteolysis suggest osteomyelitis.  No fracture or dislocation. Joint spaces appear preserved given obliquity. No discrete erosions.  IMPRESSION: Diffuse soft tissue swelling about the index finger with potential subcutaneous emphysema involving the webspace between the second and third digits without discrete area of osteolysis to suggest osteomyelitis. Further evaluation with MRI could be performed as indicated.  Electronically Signed   By: Sandi Mariscal M.D.   On: 08/08/2021 13:24 CT ABDOMEN PELVIS W CONTRAST CLINICAL DATA:  Concern for bowel obstruction.  Syncopal episode.  EXAM: CT ABDOMEN AND PELVIS WITH CONTRAST  TECHNIQUE: Multidetector CT imaging of the abdomen and pelvis was performed using the standard protocol following bolus administration of intravenous contrast.  CONTRAST:  58mL OMNIPAQUE IOHEXOL 350 MG/ML  SOLN  COMPARISON:  CT abdomen pelvis-07/27/2020  FINDINGS: Lower chest: Limited visualization of the lower thorax demonstrates minimal bibasilar subsegmental atelectasis, left greater than right. No discrete focal airspace opacities. No pleural effusion.  Cardiomegaly.  Coronary artery calcifications.  Hepatobiliary: Normal hepatic contour. There is diffuse decreased attenuation hepatic parenchyma suggestive of hepatic steatosis. No discrete hepatic lesions. Normal appearance of the gallbladder given degree distention. No radiopaque gallstones. No intra or extrahepatic biliary duct dilatation. No ascites.  Pancreas: Normal appearance of the pancreas.  Spleen: The spleen appears somewhat diminutive but otherwise normal in appearance.  Adrenals/Urinary Tract: There is symmetric enhancement and excretion of the bilateral kidneys. Note is made of an approximally 7.8 x 5.9 cm hypoattenuating (16 Hounsfield unit) partially exophytic left-sided renal cyst (representative image 18, series 10). No discrete right-sided renal lesions. No evidence of nephrolithiasis on this postcontrast examination. No urinary obstruction or perinephric stranding.  Normal appearance the bilateral adrenal glands.  The urinary bladder is decompressed with a Foley catheter though appears diffusely thick wall.  Stomach/Bowel: Large colonic stool burden without evidence of enteric obstruction. No discrete areas of bowel wall thickening. No significant hiatal hernia. No pneumoperitoneum, pneumatosis or portal venous gas. The appendix is not visualized, however there is no pericecal inflammatory change. No definitive definable/drainable fluid collection within the abdomen or pelvis.  Vascular/Lymphatic: There is a moderate to large amount of predominantly calcified atherosclerotic plaque within normal caliber abdominal aorta, not resulting in hemodynamically significant stenosis. The major branch vessels of  the abdominal aorta appear patent on this  non CTA examination. Extensive calcified plaque involves the origin of the bilateral renal arteries, incompletely evaluated. Note is made of an infrarenal IVC filter. Scratch the no bulky retroperitoneal, mesenteric, pelvic or inguinal lymphadenopathy.  Reproductive: Post hysterectomy. No discrete adnexal lesions. No free fluid within the pelvic cul-de-sac.  Other: Diffuse body wall anasarca.  Musculoskeletal: No acute or aggressive osseous abnormalities. Moderate to severe multilevel lumbar spine DDD, worse at L1-L2, L4-L5 and L5-S1 with disc space height loss, endplate irregularity and sclerosis. Grade 1 anterolisthesis of L3 upon L4 without associated pars defects.  IMPRESSION: 1. No acute findings within the abdomen or pelvis. 2. Large colonic stool burden without evidence of enteric obstruction. 3. The urinary bladder is decompressed with a Foley catheter though appears diffusely thick walled. Correlation with urinalysis is advised. No evidence of urinary obstruction. 4. Suspected hepatic steatosis. 5.  Aortic Atherosclerosis (ICD10-I70.0).  Electronically Signed   By: Sandi Mariscal M.D.   On: 08/08/2021 12:16 CT Angio Chest PE W and/or Wo Contrast CLINICAL DATA:  Syncopal episode. Shortness of breath. Concern for pulmonary embolism.  EXAM: CT ANGIOGRAPHY CHEST WITH CONTRAST  TECHNIQUE: Multidetector CT imaging of the chest was performed using the standard protocol during bolus administration of intravenous contrast. Multiplanar CT image reconstructions and MIPs were obtained to evaluate the vascular anatomy.  CONTRAST:  66mL OMNIPAQUE IOHEXOL 350 MG/ML SOLN  COMPARISON:  CT abdomen and pelvis-09/28/2019  FINDINGS: Vascular Findings:  There is adequate opacification of the pulmonary arterial system with the main pulmonary artery measuring 420 Hounsfield units. There are no discrete filling defects within the pulmonary  arterial tree to suggest pulmonary embolism. Normal caliber of the main pulmonary artery.  Cardiomegaly. No pericardial effusion, though a small amount of fluid is seen with the pericardial recess. Coronary artery calcifications. Calcifications involving the aortic valve leaflets.  Scattered atherosclerotic plaque within a normal caliber thoracic aorta. No evidence of thoracic aortic dissection or perivascular stranding on this nongated examination.  Bovine configuration of the aortic arch. The branch vessels of the aortic arch appear patent throughout their imaged courses.  Review of the MIP images confirms the above findings.  ----------------------------------------------------------------------------------  Nonvascular Findings:  Mediastinum/Lymph Nodes: No bulky mediastinal, hilar or axillary lymphadenopathy.  Lungs/Pleura: Minimal dependent bibasilar subsegmental atelectasis. No discrete focal airspace opacities. No pleural effusion or pneumothorax. The central pulmonary airways appear patent. No discrete pulmonary nodules.  Upper abdomen: Limited early arterial phase evaluation of the upper abdomen suggest a large colonic stool burden.  Musculoskeletal: Diffuse body wall anasarca. No acute or aggressive osseous abnormalities. Mild multilevel thoracic spine DDD.  IMPRESSION: 1. No acute cardiopulmonary disease. Specifically, no evidence of pulmonary embolism. 2. Cardiomegaly. 3. Coronary artery calcifications. Aortic Atherosclerosis (ICD10-I70.0).  Electronically Signed   By: Sandi Mariscal M.D.   On: 08/08/2021 12:09 CT Head Wo Contrast CLINICAL DATA:  Syncope  EXAM: CT HEAD WITHOUT CONTRAST  TECHNIQUE: Contiguous axial images were obtained from the base of the skull through the vertex without intravenous contrast.  COMPARISON:  CT examination dated February 20, 2021  FINDINGS: Brain: No evidence of acute infarction, hemorrhage, hydrocephalus, extra-axial  collection or mass lesion/mass effect. Dystrophic calcification bilateral basal ganglia. Diffuse low-attenuation of the periventricular white matter chronic microvascular ischemic changes are unchanged. Empty sella, unchanged.  Vascular: Atherosclerotic calcification of bilateral carotid siphons and vertebral arteries. No hyperdense vessel or unexpected calcification.  Skull: Normal. Negative for fracture or focal lesion.  Sinuses/Orbits: Bilateral cataract surgery. Paranasal sinuses are clear.  Other: None.  IMPRESSION: No CT evidence of acute intracranial abnormality.  Additional chronic findings as above and unchanged.  Electronically Signed   By: Keane Police D.O.   On: 08/08/2021 12:06    Eppie Gibson, MD 08/08/2021, 1:14 PM PGY-1, [facility] pager: [phone], text pages welcome  FPTS Upper-Level Resident Addendum   I have independently interviewed and examined the patient. I have discussed the above with the original author and agree with their documentation. Please see also any attending notes.   Gifford Shave, MD PGY-2, [facility] 08/08/2021 4:32 PM  [facility] pager: [phone] (text pages welcome through [facility])

## 2021-08-08 NOTE — ED Provider Notes (Signed)
[facility] EMERGENCY DEPARTMENT Provider Note   CSN: [id number] Arrival date & time: 08/08/21  1057     History Chief Complaint  Patient presents with   Near Syncope    Hayley Fisher is a 87 y.o. female presenting for evaluation of presyncope.  Level 5 caveat due to dementia.  History provided mostly by EMS.  EMS states daughter called due to patient having multiple presyncopal/syncopal events today.  Patient had 2 episodes where she almost passed out, 1 episode where she fully lost consciousness for a few seconds.  She never fell or hit her head.  Patient was seen last week for an episode of syncope that happened after using the toilet.  Patient denies any chest pain or shortness of breath when she felt like she can pass out.  She states she did feel dizzy/lightheaded.  Mostly she is complaining about needing to have a bowel movement.  However then she states she feels this way all the time.  Additional history obtained from chart review.  History of arthritis, CKD, diabetes, hyperlipidemia, hypertension, hypothyroidism.  I reviewed recent ER visit including labs and imaging.  Patient had COVID about a month ago.  HPI     Past Medical History:  Diagnosis Date   Arthritis    Arthritis    Cataract    bil cateracts removed   Chronic kidney disease    "spot on one of my kidneys" per pt   Diabetes mellitus    Glaucoma    Hyperkalemia 07/02/2020   Hyperlipidemia    Hyperplastic colon polyp    Hypertension    Hypothyroidism 12/30/2583   Metabolic acidosis 27/78/2423   Syncope    Thyroid disease     Patient Active Problem List   Diagnosis Date Noted   Incontinence 07/14/2021   Pressure injury of right buttock, stage 2 ([place]) 07/14/2021   COVID-19 virus infection 06/27/2021   Normocytic anemia 06/27/2021   Leukocytopenia 06/27/2021   Asymptomatic bacteriuria 06/27/2021   Malnutrition of moderate degree 02/23/2021   AMS (altered mental status)  02/21/2021   Cognitive impairment 53/61/4431   Acute metabolic encephalopathy 54/00/8676   Sepsis ([place]) 01/15/2021   Acute encephalopathy 12/09/2020   Hyperbilirubinemia 12/09/2020   Hypothyroidism 12/09/2020   Memory changes 10/20/2020   Acute on chronic anemia 09/21/2020   Gastroesophageal reflux disease without esophagitis 09/16/2020   Urinary frequency 09/11/2020   Stage 1 decubitus ulcer 07/30/2020   Uncontrolled type 2 diabetes mellitus with hyperglycemia ([place]) 07/02/2020   Unstable gait 04/03/2018   Frequent falls 04/03/2018   Age-related physical debility 03/22/2018   Hematuria 01/29/2018   Skin ulcer of sacrum, limited to breakdown of skin ([place]) 03/16/2017   Spinal stenosis of lumbar region 01/13/2016   Chronic venous insufficiency 11/09/2015   DDD (degenerative disc disease), lumbosacral 11/24/2014   Constipation by delayed colonic transit 11/24/2014   Urinary tract infection without hematuria 07/03/2014   Frequent UTI 10/29/2012   Pruritus 03/17/2011   Type 2 diabetes mellitus with diabetic neuropathy ([place]) 10/19/2006   Hyperlipidemia associated with type 2 diabetes mellitus ([place]) 10/19/2006   Hypertension associated with diabetes ([place]) 10/19/2006   Osteopenia 10/19/2006    Past Surgical History:  Procedure Laterality Date   ABDOMINAL HYSTERECTOMY     BLADDER SUSPENSION     bladder tacking     COLONOSCOPY     EYE SURGERY     INGUINAL HERNIA REPAIR Right    IR IVC FILTER PLMT / S&I [facility] Keels GUID/MOD SED  01/16/2021     OB History   No obstetric history on file.     Family History  Problem Relation Age of Onset   Colon cancer Mother    Other Father        cerebral hemorrhage   Diabetes Brother    Diabetes Sister        x 2   Bone cancer Daughter    Breast cancer Daughter 23   Esophageal cancer Neg Hx    Rectal cancer Neg Hx    Stomach cancer Neg Hx     Social History   Tobacco Use   Smoking status: Former    Types: Cigarettes    Quit date:  11/29/1980    Years since quitting: 40.7   Smokeless tobacco: Never  Vaping Use   Vaping Use: Never used  Substance Use Topics   Alcohol use: No    Alcohol/week: 0.0 standard drinks   Drug use: No    Home Medications Prior to Admission medications   Medication Sig Start Date End Date Taking? Authorizing Provider  Accu-Chek Softclix Lancets lancets 1 each by Other route See admin instructions. USE TO TEST BLOOD SUGAR UP  TO 3 TIMES A DAY. 02/25/21   Jennye Boroughs, MD  aspirin 81 MG chewable tablet Chew 81 mg by mouth every morning.    [provider]  atorvastatin (LIPITOR) 40 MG tablet TAKE 1 TABLET BY MOUTH EVERY DAY Patient taking differently: Take 40 mg by mouth daily. 07/30/21   [name], MD  brimonidine (ALPHAGAN) 0.2 % ophthalmic solution Place 1 drop into the left eye 2 (two) times daily. 03/30/21   [provider]  carbamide peroxide (DEBROX) 6.5 % OTIC solution Place 2 drops into both ears 2 (two) times daily as needed (ear wax). 01/20/21   Aline August, MD  Catheter Self-Adhesive Urinary MISC 1 patch by Does not apply route daily. 10/26/20   Richarda Osmond, MD  cetirizine (ZYRTEC) 10 MG tablet Take 10 mg by mouth daily as needed for allergies.    [provider]  clotrimazole-betamethasone (LOTRISONE) cream Apply 1 application topically 2 (two) times daily as needed (rash/itching). 05/13/21   [provider]  diphenhydrAMINE (BENADRYL) 25 MG tablet Take 25 mg by mouth every 6 (six) hours as needed for itching (with each dose of tramadol).    [provider]  docusate sodium (COLACE) 100 MG capsule Take 1 capsule (100 mg total) by mouth 2 (two) times daily. Patient taking differently: Take 100 mg by mouth every morning. 12/15/20   Harold Hedge, MD  dorzolamide-timolol (COSOPT) 22.3-6.8 MG/ML ophthalmic solution Place 1 drop into both eyes 2 (two) times daily. 05/18/20   [provider]  DULoxetine (CYMBALTA) 60 MG capsule  Take 1 capsule (60 mg total) by mouth daily. 02/25/21   Jennye Boroughs, MD  enalapril (VASOTEC) 20 MG tablet Take 1 tablet (20 mg total) by mouth daily. 09/21/20   Mullis, Kiersten P, DO  famotidine (PEPCID) 20 MG tablet Take 1 tablet (20 mg total) by mouth daily. 02/25/21   Jennye Boroughs, MD  ferrous sulfate 325 (65 FE) MG tablet Take 325 mg by mouth daily with breakfast.    [provider]  glucose blood (ACCU-CHEK AVIVA PLUS) test strip 1 each by Other route See admin instructions. USE TO TEST BLOOD SUGAR UP  TO 3 TIMES A DAY. 02/25/21   Jennye Boroughs, MD  hydrocortisone (ANUSOL-HC) 2.5 % rectal cream Place 1 application rectally 2 (two)  times daily as needed for hemorrhoids or anal itching. Please use twice daily x5 days, and then twice daily as needed. 07/02/21   Eugenie Filler, MD  hydrOXYzine (ATARAX/VISTARIL) 10 MG tablet Take 10 mg by mouth every 4 (four) hours as needed for itching. 04/09/21   [provider]  insulin glargine (LANTUS) 100 unit/mL SOPN Inject 10 Units into the skin daily. Patient taking differently: Inject 10 Units into the skin daily before breakfast. 04/15/21   Renato Shin, MD  Insulin Pen Needle 32G X 4 MM MISC Use as directed with insulin 04/12/21     Insulin Syringe-Needle U-100 (SURE COMFORT INSULIN SYRINGE) 31G X 5/16" 0.3 ML MISC Inject 1 Syringe into the skin daily. 02/25/21   Jennye Boroughs, MD  ketoconazole (NIZORAL) 2 % cream Apply 1 application topically daily as needed for irritation. 03/11/21   [provider]  Latanoprostene Bunod (VYZULTA) 0.024 % SOLN Place 1 drop into both eyes at bedtime.    [provider]  levothyroxine (SYNTHROID) 50 MCG tablet Take 1 tablet (50 mcg total) by mouth daily. 11/10/20   Renato Shin, MD  metFORMIN (GLUCOPHAGE-XR) 500 MG 24 hr tablet Take 2 tablets (1,000 mg total) by mouth daily with breakfast. 04/06/21   Renato Shin, MD  Misc. Devices (TRANSFER BOARD) MISC Use as needed for transfer from  bed to chair or vice versa 02/25/21   Jennye Boroughs, MD  Multiple Vitamins-Minerals (PRESERVISION AREDS 2+MULTI VIT) CAPS Take 1 capsule by mouth daily.    [provider]  MYRBETRIQ 25 MG TB24 tablet TAKE 1 TABLET BY MOUTH DAILY Patient taking differently: Take 25 mg by mouth every morning. 04/01/21   [name], MD  ondansetron (ZOFRAN ODT) 4 MG disintegrating tablet Take 1 tablet (4 mg total) by mouth every 8 (eight) hours as needed for nausea. 07/28/20   Larene Pickett, PA-C  polyethylene glycol powder (GLYCOLAX/MIRALAX) 17 GM/SCOOP powder Take 17 g by mouth daily. Patient taking differently: Take 17 g by mouth daily as needed (constipation). 12/15/20   Harold Hedge, MD  SARNA lotion Apply 1 application topically 4 (four) times daily as needed for itching. 05/27/21   [provider]  Semaglutide (RYBELSUS) 3 MG TABS Take 3 mg by mouth daily. 11/09/20   Renato Shin, MD  senna (SENOKOT) 8.6 MG TABS tablet Take 1 tablet by mouth every morning. 05/17/21   [provider]  tamsulosin (FLOMAX) 0.4 MG CAPS capsule Take 0.4 mg by mouth every morning. 10/08/20   [provider]  traMADol (ULTRAM) 50 MG tablet Take 50 mg by mouth every 6 (six) hours as needed for severe pain. 05/27/21   [provider]  witch hazel-glycerin (TUCKS) pad Apply 1 application topically 2 (two) times daily as needed for itching. 02/25/21   Jennye Boroughs, MD    Allergies    Tramadol and Levemir [insulin detemir]  Review of Systems   Review of Systems  Neurological:  Positive for syncope and light-headedness.  All other systems reviewed and are negative.  Physical Exam Updated Vital Signs BP (!) 158/92    Pulse 100    Temp (!) 96.2 F (35.7 C) (Rectal)    Resp 20    Ht 5\' 2"  (1.575 m)    Wt 59 kg    SpO2 100%    BMI 23.78 kg/m   Physical Exam Vitals and nursing note reviewed.  Constitutional:      General: She is not in acute distress.  Appearance: Normal  appearance.  HENT:     Head: Normocephalic and atraumatic.  Eyes:     Extraocular Movements: Extraocular movements intact.     Conjunctiva/sclera: Conjunctivae normal.     Pupils: Pupils are equal, round, and reactive to light.  Cardiovascular:     Rate and Rhythm: Regular rhythm. Tachycardia present.     Pulses: Normal pulses.     Comments: HR btwn 100-105 on my evaluation Pulmonary:     Effort: Pulmonary effort is normal. No respiratory distress.     Breath sounds: Normal breath sounds. No wheezing.     Comments: Speaking in full sentences.  Clear lung sounds in all fields. Abdominal:     General: There is no distension.     Palpations: Abdomen is soft. There is no mass.     Tenderness: There is abdominal tenderness. There is no guarding or rebound.     Comments: Mild diffuse ttp of the abd  Musculoskeletal:        General: Normal range of motion.     Cervical back: Normal range of motion and neck supple.  Skin:    General: Skin is warm and dry.     Capillary Refill: Capillary refill takes less than 2 seconds.  Neurological:     Mental Status: She is alert and oriented to person, place, and time.     GCS: GCS eye subscore is 4. GCS verbal subscore is 5. GCS motor subscore is 6.     Cranial Nerves: Cranial nerves 2-12 are intact.     Sensory: Sensation is intact.     Motor: Motor function is intact.     Comments: No neurologic deficits  Psychiatric:        Mood and Affect: Mood and affect normal.        Speech: Speech normal.        Behavior: Behavior normal.    ED Results / Procedures / Treatments   Labs (all labs ordered are listed, but only abnormal results are displayed) Labs Reviewed  COMPREHENSIVE METABOLIC PANEL - Abnormal; Notable for the following components:      Result Value   Glucose, Bld 195 (*)    Calcium 8.8 (*)    Total Protein 6.1 (*)    Albumin 3.3 (*)    All other components within normal limits  CBC WITH DIFFERENTIAL/PLATELET - Abnormal; Notable  for the following components:   RBC 3.52 (*)    Hemoglobin 11.2 (*)    MCV 103.7 (*)    Platelets 146 (*)    All other components within normal limits  URINALYSIS, ROUTINE W REFLEX MICROSCOPIC - Abnormal; Notable for the following components:   Color, Urine PINK (*)    APPearance HAZY (*)    Glucose, UA 100 (*)    Hgb urine dipstick LARGE (*)    Protein, ur 30 (*)    Nitrite POSITIVE (*)    Leukocytes,Ua SMALL (*)    All other components within normal limits  MAGNESIUM - Abnormal; Notable for the following components:   Magnesium 1.6 (*)    All other components within normal limits  TSH - Abnormal; Notable for the following components:   TSH 5.373 (*)    All other components within normal limits  URINALYSIS, MICROSCOPIC (REFLEX) - Abnormal; Notable for the following components:   Bacteria, UA RARE (*)    All other components within normal limits  CULTURE, BLOOD (ROUTINE X 2)  CULTURE, BLOOD (ROUTINE X 2)  URINE  CULTURE  LACTIC ACID, PLASMA  LACTIC ACID, PLASMA  TROPONIN I (HIGH SENSITIVITY)  TROPONIN I (HIGH SENSITIVITY)    EKG None  Radiology CT Head Wo Contrast  Result Date: 08/08/2021 CLINICAL DATA:  Syncope EXAM: CT HEAD WITHOUT CONTRAST TECHNIQUE: Contiguous axial images were obtained from the base of the skull through the vertex without intravenous contrast. COMPARISON:  CT examination dated February 20, 2021 FINDINGS: Brain: No evidence of acute infarction, hemorrhage, hydrocephalus, extra-axial collection or mass lesion/mass effect. Dystrophic calcification bilateral basal ganglia. Diffuse low-attenuation of the periventricular white matter chronic microvascular ischemic changes are unchanged. Empty sella, unchanged. Vascular: Atherosclerotic calcification of bilateral carotid siphons and vertebral arteries. No hyperdense vessel or unexpected calcification. Skull: Normal. Negative for fracture or focal lesion. Sinuses/Orbits: Bilateral cataract surgery. Paranasal sinuses  are clear. Other: None. IMPRESSION: No CT evidence of acute intracranial abnormality. Additional chronic findings as above and unchanged. Electronically Signed   By: Keane Police D.O.   On: 08/08/2021 12:06   CT Angio Chest PE W and/or Wo Contrast  Result Date: 08/08/2021 CLINICAL DATA:  Syncopal episode. Shortness of breath. Concern for pulmonary embolism. EXAM: CT ANGIOGRAPHY CHEST WITH CONTRAST TECHNIQUE: Multidetector CT imaging of the chest was performed using the standard protocol during bolus administration of intravenous contrast. Multiplanar CT image reconstructions and MIPs were obtained to evaluate the vascular anatomy. CONTRAST:  51mL OMNIPAQUE IOHEXOL 350 MG/ML SOLN COMPARISON:  CT abdomen and pelvis-09/28/2019 FINDINGS: Vascular Findings: There is adequate opacification of the pulmonary arterial system with the main pulmonary artery measuring 420 Hounsfield units. There are no discrete filling defects within the pulmonary arterial tree to suggest pulmonary embolism. Normal caliber of the main pulmonary artery. Cardiomegaly. No pericardial effusion, though a small amount of fluid is seen with the pericardial recess. Coronary artery calcifications. Calcifications involving the aortic valve leaflets. Scattered atherosclerotic plaque within a normal caliber thoracic aorta. No evidence of thoracic aortic dissection or perivascular stranding on this nongated examination. Bovine configuration of the aortic arch. The branch vessels of the aortic arch appear patent throughout their imaged courses. Review of the MIP images confirms the above findings. ---------------------------------------------------------------------------------- Nonvascular Findings: Mediastinum/Lymph Nodes: No bulky mediastinal, hilar or axillary lymphadenopathy. Lungs/Pleura: Minimal dependent bibasilar subsegmental atelectasis. No discrete focal airspace opacities. No pleural effusion or pneumothorax. The central pulmonary airways  appear patent. No discrete pulmonary nodules. Upper abdomen: Limited early arterial phase evaluation of the upper abdomen suggest a large colonic stool burden. Musculoskeletal: Diffuse body wall anasarca. No acute or aggressive osseous abnormalities. Mild multilevel thoracic spine DDD. IMPRESSION: 1. No acute cardiopulmonary disease. Specifically, no evidence of pulmonary embolism. 2. Cardiomegaly. 3. Coronary artery calcifications. Aortic Atherosclerosis (ICD10-I70.0). Electronically Signed   By: Sandi Mariscal M.D.   On: 08/08/2021 12:09   CT ABDOMEN PELVIS W CONTRAST  Result Date: 08/08/2021 CLINICAL DATA:  Concern for bowel obstruction.  Syncopal episode. EXAM: CT ABDOMEN AND PELVIS WITH CONTRAST TECHNIQUE: Multidetector CT imaging of the abdomen and pelvis was performed using the standard protocol following bolus administration of intravenous contrast. CONTRAST:  42mL OMNIPAQUE IOHEXOL 350 MG/ML SOLN COMPARISON:  CT abdomen pelvis-07/27/2020 FINDINGS: Lower chest: Limited visualization of the lower thorax demonstrates minimal bibasilar subsegmental atelectasis, left greater than right. No discrete focal airspace opacities. No pleural effusion. Cardiomegaly.  Coronary artery calcifications. Hepatobiliary: Normal hepatic contour. There is diffuse decreased attenuation hepatic parenchyma suggestive of hepatic steatosis. No discrete hepatic lesions. Normal appearance of the gallbladder given degree distention. No radiopaque gallstones. No intra or extrahepatic biliary  duct dilatation. No ascites. Pancreas: Normal appearance of the pancreas. Spleen: The spleen appears somewhat diminutive but otherwise normal in appearance. Adrenals/Urinary Tract: There is symmetric enhancement and excretion of the bilateral kidneys. Note is made of an approximally 7.8 x 5.9 cm hypoattenuating (16 Hounsfield unit) partially exophytic left-sided renal cyst (representative image 18, series 10). No discrete right-sided renal lesions.  No evidence of nephrolithiasis on this postcontrast examination. No urinary obstruction or perinephric stranding. Normal appearance the bilateral adrenal glands. The urinary bladder is decompressed with a Foley catheter though appears diffusely thick wall. Stomach/Bowel: Large colonic stool burden without evidence of enteric obstruction. No discrete areas of bowel wall thickening. No significant hiatal hernia. No pneumoperitoneum, pneumatosis or portal venous gas. The appendix is not visualized, however there is no pericecal inflammatory change. No definitive definable/drainable fluid collection within the abdomen or pelvis. Vascular/Lymphatic: There is a moderate to large amount of predominantly calcified atherosclerotic plaque within normal caliber abdominal aorta, not resulting in hemodynamically significant stenosis. The major branch vessels of the abdominal aorta appear patent on this non CTA examination. Extensive calcified plaque involves the origin of the bilateral renal arteries, incompletely evaluated. Note is made of an infrarenal IVC filter. Scratch the no bulky retroperitoneal, mesenteric, pelvic or inguinal lymphadenopathy. Reproductive: Post hysterectomy. No discrete adnexal lesions. No free fluid within the pelvic cul-de-sac. Other: Diffuse body wall anasarca. Musculoskeletal: No acute or aggressive osseous abnormalities. Moderate to severe multilevel lumbar spine DDD, worse at L1-L2, L4-L5 and L5-S1 with disc space height loss, endplate irregularity and sclerosis. Grade 1 anterolisthesis of L3 upon L4 without associated pars defects. IMPRESSION: 1. No acute findings within the abdomen or pelvis. 2. Large colonic stool burden without evidence of enteric obstruction. 3. The urinary bladder is decompressed with a Foley catheter though appears diffusely thick walled. Correlation with urinalysis is advised. No evidence of urinary obstruction. 4. Suspected hepatic steatosis. 5.  Aortic Atherosclerosis  (ICD10-I70.0). Electronically Signed   By: Sandi Mariscal M.D.   On: 08/08/2021 12:16   DG Finger Index Right  Result Date: 08/08/2021 CLINICAL DATA:  Concern for infection. EXAM: RIGHT INDEX FINGER 2+V COMPARISON:  None. FINDINGS: Diffuse soft tissue swelling about the index finger with potential minimal amount of subcutaneous emphysema involving the webspace between the second and third digits. No radiopaque foreign body. No discrete areas of osteolysis suggest osteomyelitis. No fracture or dislocation. Joint spaces appear preserved given obliquity. No discrete erosions. IMPRESSION: Diffuse soft tissue swelling about the index finger with potential subcutaneous emphysema involving the webspace between the second and third digits without discrete area of osteolysis to suggest osteomyelitis. Further evaluation with MRI could be performed as indicated. Electronically Signed   By: Sandi Mariscal M.D.   On: 08/08/2021 13:24    Procedures Procedures   Medications Ordered in ED Medications  iohexol (OMNIPAQUE) 350 MG/ML injection 80 mL (80 mLs Intravenous Contrast Given 08/08/21 1130)    ED Course  I have reviewed the triage vital signs and the nursing notes.  Pertinent labs & imaging results that were available during my care of the patient were reviewed by me and considered in my medical decision making (see chart for details).    MDM Rules/Calculators/A&P                          Patient presenting for evaluation of syncope and hypotension.  On exam, patient appears nontoxic.  No neurologic deficits.  Vital signs are stable including blood  pressure on arrival to the ER.  Consider another vasovagal episode.  Consider dehydration/orthostatic hypotension.  However in the setting of multiple reported syncopal events today as well as one earlier in the week, also consider arrhythmia, infection, anemia, electrolyte abnormality, intracranial cause.  Will obtain labs, EKG, CT head.  As patient is minimally  tachycardic with recurrent syncopal events, will obtain a CTA to rule out PE as she did have COVID about a month ago.  As she is reporting issues with her bowels, will obtain a CT of the abdomen.  Family arrived at bedside.  They state that patient had a syncopal event while on the toilet today.  She was then transferred to the wheelchair, and had a second syncopal event while in the wheelchair.  She did not fall or hit her head either time.  Additionally, family reports that she has a burn/wound on her finger which has been present for over a week, and seems to be worsening.   Patient's rectal temperature low at 96, placed on Bair hugger.  In the setting of hypotension, hypothermia, possible infectious source of the finger, will obtain lactic and blood cultures.  X-ray of the finger ordered.  CT head negative for acute findings.  CTA of the chest negative for PE.  CT abdomen pelvis consistent with constipation, however no other acute findings.  Labs interpreted by me, overall reassuring.  No leukocytosis.  Electrolytes are stable.  Hemoglobin at baseline.  TSH is minimally elevated, however I doubt this is the cause of patient's syncope.  Patient's urine is positive for infection, however per chart review, she is chronically colonized with Klebsiella.  Last urine culture was from a few days ago, susceptible to Zosyn.  Repeat culture ordered.  X-ray of the finger does not show osteomyelitis, does show soft tissue swelling.  Will cover wound tx with Zosyn as well.  Will call for admission.  Discussed with family medicine resident, patient to be admitted     Final Clinical Impression(s) / ED Diagnoses Final diagnoses:  Syncope, unspecified syncope type  Finger infection  Constipation, unspecified constipation type    Rx / DC Orders ED Discharge Orders     None        Franchot Heidelberg, PA-C 08/08/21 1345    Long, Wonda Olds, MD 08/11/21 [phone]

## 2021-08-09 ENCOUNTER — Observation Stay (HOSPITAL_BASED_OUTPATIENT_CLINIC_OR_DEPARTMENT_OTHER): Payer: Medicare Other

## 2021-08-09 ENCOUNTER — Telehealth: Payer: Self-pay

## 2021-08-09 ENCOUNTER — Telehealth: Payer: Medicare Other

## 2021-08-09 ENCOUNTER — Other Ambulatory Visit (HOSPITAL_COMMUNITY): Payer: Self-pay

## 2021-08-09 DIAGNOSIS — R55 Syncope and collapse: Secondary | ICD-10-CM | POA: Diagnosis not present

## 2021-08-09 LAB — CBC
HCT: 30.7 % — ABNORMAL LOW (ref 36.0–46.0)
Hemoglobin: 9.6 g/dL — ABNORMAL LOW (ref 12.0–15.0)
MCH: 31.2 pg (ref 26.0–34.0)
MCHC: 31.3 g/dL (ref 30.0–36.0)
MCV: 99.7 fL (ref 80.0–100.0)
Platelets: 169 10*3/uL (ref 150–400)
RBC: 3.08 MIL/uL — ABNORMAL LOW (ref 3.87–5.11)
RDW: 13.5 % (ref 11.5–15.5)
WBC: 3 10*3/uL — ABNORMAL LOW (ref 4.0–10.5)
nRBC: 0 % (ref 0.0–0.2)

## 2021-08-09 LAB — MAGNESIUM: Magnesium: 2 mg/dL (ref 1.7–2.4)

## 2021-08-09 LAB — ECHOCARDIOGRAM COMPLETE
AR max vel: 1.89 cm2
AV Peak grad: 11 mmHg
Ao pk vel: 1.66 m/s
Area-P 1/2: 5.5 cm2
Calc EF: 58.2 %
Height: 62 in
P 1/2 time: 497 msec
S' Lateral: 2.7 cm
Single Plane A2C EF: 59.2 %
Single Plane A4C EF: 57.7 %
Weight: 2080 oz

## 2021-08-09 LAB — BASIC METABOLIC PANEL
Anion gap: 5 (ref 5–15)
BUN: 21 mg/dL (ref 8–23)
CO2: 26 mmol/L (ref 22–32)
Calcium: 8.5 mg/dL — ABNORMAL LOW (ref 8.9–10.3)
Chloride: 102 mmol/L (ref 98–111)
Creatinine, Ser: 1.1 mg/dL — ABNORMAL HIGH (ref 0.44–1.00)
GFR, Estimated: 48 mL/min — ABNORMAL LOW (ref >60)
Glucose, Bld: 297 mg/dL — ABNORMAL HIGH (ref 70–99)
Potassium: 4.2 mmol/L (ref 3.5–5.1)
Sodium: 133 mmol/L — ABNORMAL LOW (ref 135–145)

## 2021-08-09 LAB — CBG MONITORING, ED: Glucose-Capillary: 372 mg/dL — ABNORMAL HIGH (ref 70–99)

## 2021-08-09 MED ORDER — POLYETHYLENE GLYCOL 3350 17 GM/SCOOP PO POWD
17.0000 g | Freq: Two times a day (BID) | ORAL | 0 refills | Status: DC
  Filled 2021-08-09: qty 238, 7d supply, fill #0

## 2021-08-09 MED ORDER — SENNA 8.6 MG PO TABS
1.0000 | ORAL_TABLET | Freq: Two times a day (BID) | ORAL | 0 refills | Status: DC
  Filled 2021-08-09: qty 120, 60d supply, fill #0

## 2021-08-09 MED ORDER — INSULIN ASPART 100 UNIT/ML IJ SOLN
0.0000 [IU] | Freq: Three times a day (TID) | INTRAMUSCULAR | Status: DC
  Administered 2021-08-09: 12:00:00 9 [IU] via SUBCUTANEOUS

## 2021-08-09 NOTE — Hospital Course (Addendum)
Hayley Fisher is a 87 y.o. female that presented with recurrent syncope. PMH is significant for HTN, T2DM, hypothyroidism, urinary incontinence with chronic indwelling urinary catheter.    Syncope, likely vasovagal secondary to constipation Patient presented after 2 syncopal episodes related to defecation. On admission, patient's labs were overall reassuring. CT head was negative for acute abnormality. CTA chest was negative for a PE. CT abdomen pelvis showed large colonic stool burden and patient endorsed hard, "marble-like" stool. Echocardiogram was completed to rule out cardiac etiology and showed G1DD with asymmetric LVH that was more consistent with HTN than an etiology that would contribute to syncope. Patient was given a milk and molasses enema with significant relief of the stool and improvement in symptoms. Patient was discharged with a bowel regimen of Miralax and Senna BID.  Wound/burn on R index finger Patient with superficial, partial thickness burn on right index finger. XR with potential subcutaneous emphysema involving webspace between 2nd and 3rd digits without suggestion of osteomyelitis. Hand surgery was consulted with no need for further evaluation. Wound care was consulted with recommendations for care while hospitalized. Hand surgery plans to follow this up on an outpatient basis.   Chronic foley catheter Patient with chronic catheter, UA was abnormal and culture positive for Klebsiella spp, but we elected not treat as there was no evidence of systemic infection. Patient was mildly hypothermic on admission but resolved without intervention. Catheter was exchanged prior to discharge. Recommend re-evaluating if chronic catheter remains necessary.   T2DM Patient was placed on sliding scale insulin during admission and was continued upon home Lantus at discharge.  All other chronic conditions were treated with home medications as appropriate.    Issues for follow-up: Recommend  urology follow-up to evaluate need for chronic catheter. Chronic catheter was exchanged prior to discharge. Recommend optimizing BP regimen given echocardiogram findings.

## 2021-08-09 NOTE — ED Notes (Signed)
Pt wheeled to waiting room and placed in car with daughter. Pt verbalized understanding of discharge instructions.

## 2021-08-09 NOTE — Telephone Encounter (Signed)
° °  RN Case Manager Care Management   Phone Outreach    08/09/2021 Name: Hayley Fisher MRN: [id number] DOB: 1931-03-18  Hayley Fisher is a 87 y.o. year old female who is a primary care patient of [name], MD .   Telephone outreach was unsuccessful A HIPPA compliant phone message was left for the patient providing contact information and requesting a return call.   Follow Up Plan: Will route chart to Care Guide to see if patient would like to reschedule phone appointment    Review of patient status, including review of consultants reports, relevant laboratory and other test results, and collaboration with appropriate care team members and the patient's provider was performed as part of comprehensive patient evaluation and provision of care management services.    Lazaro Arms RN, BSN, [facility] Care Management Coordinator [place] Phone: [phone] Fax: [phone]

## 2021-08-09 NOTE — Evaluation (Signed)
Occupational Therapy Evaluation Patient Details Name: Hayley Fisher MRN: [id number] DOB: 1931-05-15 Today's Date: 08/09/2021   History of Present Illness Pt is 87 y/o F admitted for recurrent syncope. PMH includes HTN, T2DM, hypothyroidism, urinary incontinence with chronic indwelling urinary catheter.   Clinical Impression   Pt presents with decreased balance, strength, and activity tolerance. At baseline, pt non-ambulatory, using manual w/c for mobility at home. Family assists with ADLs and has an aide that comes to the home a few hours each day. Pt reports that she is at her baseline for occupational performance. Should be safe to return home with current supports in place without any further skilled OT services once medically cleared. Will sign off as pt at baseline.      Recommendations for follow up therapy are one component of a multi-disciplinary discharge planning process, led by the attending physician.  Recommendations may be updated based on patient status, additional functional criteria and insurance authorization.   Follow Up Recommendations  No OT follow up    Assistance Recommended at Discharge Frequent or constant Supervision/Assistance  Functional Status Assessment  Patient has had a recent decline in their functional status and demonstrates the ability to make significant improvements in function in a reasonable and predictable amount of time.  Equipment Recommendations  None recommended by OT    Recommendations for Other Services       Precautions / Restrictions Precautions Precautions: Fall;Other (comment) Precaution Comments: monitor BP Restrictions Weight Bearing Restrictions: No      Mobility Bed Mobility Overal bed mobility: Needs Assistance Bed Mobility: Supine to Sit;Sit to Supine     Supine to sit: Min guard Sit to supine: Min guard        Transfers Overall transfer level: Needs assistance Equipment used: 1 person hand held  assist Transfers: Sit to/from Stand Sit to Stand: Mod assist                  Balance Overall balance assessment: Needs assistance Sitting-balance support: No upper extremity supported Sitting balance-Leahy Scale: Fair     Standing balance support: Bilateral upper extremity supported Standing balance-Leahy Scale: Poor                             ADL either performed or assessed with clinical judgement   ADL Overall ADL's : Needs assistance/impaired;At baseline Eating/Feeding: Set up   Grooming: Minimal assistance   Upper Body Bathing: Moderate assistance   Lower Body Bathing: Maximal assistance   Upper Body Dressing : Moderate assistance   Lower Body Dressing: Maximal assistance       Toileting- Clothing Manipulation and Hygiene: Total assistance         General ADL Comments: Mod - Max A for ADLs and funcitonal transfers. Reports this is her baseline.     Vision Patient Visual Report: No change from baseline       Perception     Praxis      Pertinent Vitals/Pain Pain Assessment: No/denies pain     Hand Dominance Right   Extremity/Trunk Assessment Upper Extremity Assessment Upper Extremity Assessment: Generalized weakness (Limited ROM in digits of B hands, however reports this has been the case for some time)   Lower Extremity Assessment Lower Extremity Assessment: Defer to PT evaluation   Cervical / Trunk Assessment Cervical / Trunk Assessment: Kyphotic   Communication Communication Communication: No difficulties   Cognition Arousal/Alertness: Awake/alert Behavior During Therapy: WFL for tasks assessed/performed Overall  Cognitive Status: Within Functional Limits for tasks assessed                                       General Comments       Exercises     Shoulder Instructions      Home Living Family/patient expects to be discharged to:: Private residence Living Arrangements: Children;Other  relatives Available Help at Discharge: Family;Available 24 hours/day Type of Home: House Home Access: Ramped entrance     Home Layout: One level     Bathroom Shower/Tub: Occupational psychologist: Handicapped height     Home Equipment: Wheelchair - manual;BSC/3in1;Hospital bed;Shower Land (2 wheels)          Prior Functioning/Environment Prior Level of Function : Needs assist             Mobility Comments: w/c for mobility, assistance for transfers ADLs Comments: Assist with most ADLs        OT Problem List: Decreased strength;Decreased activity tolerance;Impaired balance (sitting and/or standing);Cardiopulmonary status limiting activity      OT Treatment/Interventions: Self-care/ADL training;Therapeutic exercise;Energy conservation;DME and/or AE instruction;Therapeutic activities;Patient/family education;Balance training    OT Goals(Current goals can be found in the care plan section) Acute Rehab OT Goals Patient Stated Goal: return home OT Goal Formulation: With patient  OT Frequency: Min 2X/week   Barriers to D/C:            Co-evaluation              AM-PAC OT "6 Clicks" Daily Activity     Outcome Measure Help from another person eating meals?: A Little Help from another person taking care of personal grooming?: A Lot Help from another person toileting, which includes using toliet, bedpan, or urinal?: Total Help from another person bathing (including washing, rinsing, drying)?: A Lot Help from another person to put on and taking off regular upper body clothing?: A Lot Help from another person to put on and taking off regular lower body clothing?: A Lot 6 Click Score: 12   End of Session Equipment Utilized During Treatment: Gait belt Nurse Communication: Mobility status  Activity Tolerance: Patient tolerated treatment well Patient left: in bed;with call bell/phone within reach  OT Visit Diagnosis: Unsteadiness on feet  (R26.81);History of falling (Z91.81);Muscle weakness (generalized) (M62.81)                Time: 7902-4097 OT Time Calculation (min): 15 min Charges:  OT General Charges $OT Visit: 1 Visit OT Evaluation $OT Eval Moderate Complexity: 1 Mod  Jenissa Tyrell C, OT/L  Acute Rehab [place] 08/09/2021, 9:21 AM

## 2021-08-09 NOTE — ED Notes (Signed)
Breakfast orders placed 

## 2021-08-09 NOTE — Discharge Instructions (Addendum)
Dear Hayley Fisher,  Thank you for letting us participate in your care. You were hospitalized for passing out while on the toilet and diagnosed with vaso-vagal syncope related to straining with bowel movements. You were treated with an enema. Thankfully, the remainder of your workup, including scans of your head, chest, heart, and abdomen, did not show a more serious cause of your symptoms.   POST-HOSPITAL & CARE INSTRUCTIONS Be sure that you are using Miralax and Senna every day at least until your visit in our clinic on Friday. If you find that you are having too many bowel movements after that, you can cut back to once per day, but you should continue using these medicines AT LEAST once per day to avoid becoming constipated.  Dr. Tempie Donning, the hand surgeon, said that he would see you in his clinic for your hand. You can call his office at [phone] to make an appointment.  Go to your follow up appointments (listed below)   DOCTOR'S APPOINTMENT   Future Appointments  Date Time Provider [place]  08/09/2021  1:30 PM [facility]-CASE MANAGER [facility] [place]  08/13/2021  2:30 PM Lyndee Hensen, DO [facility] [place]  09/08/2021  1:30 PM Renato Shin, MD [facility] None     Take care and be well!  [facility]  [place], [place] [address] [phone]

## 2021-08-09 NOTE — Progress Notes (Signed)
Inpatient Diabetes Program Recommendations  AACE/ADA: New Consensus Statement on Inpatient Glycemic Control (2015)  Target Ranges:  Prepandial:   less than 140 mg/dL      Peak postprandial:   less than 180 mg/dL (1-2 hours)      Critically ill patients:  140 - 180 mg/dL    Latest Reference Range & Units 08/08/21 16:59 08/08/21 23:12  Glucose-Capillary 70 - 99 mg/dL 165 (H) 260 (H)      Home DM Meds: Lantus 10 units Daily        Metformin 1000 mg daily        Rybelsus 3 mg daily  Current Orders: Semglee 10 units daily     MD- Note Semglee insulin to start this AM  Please also consider starting Novolog Sensitive Correction Scale/ SSI (0-9 units) TID AC + HS      --Will follow patient during hospitalization--  Wyn Quaker RN, MSN, CDE Diabetes Coordinator Inpatient Glycemic Control Team Team Pager: [phone] (8a-5p)

## 2021-08-09 NOTE — Progress Notes (Signed)
FPTS Brief Progress Note  S: Hayley Fisher was seen this p.m., awake. She requested a cream for her backside, stated that there is pain when she moves around.  Stated she is not sure if is an ulcer.  Otherwise she has no other complaints.   O: BP 137/79 (BP Location: Left Arm)    Pulse 91    Temp 98.3 F (36.8 C) (Oral)    Resp 18    Ht 5\' 2"  (1.575 m)    Wt 59 kg    SpO2 98%    BMI 23.78 kg/m   Physical Exam Vitals and nursing note reviewed.  Constitutional:      General: She is awake. She is not in acute distress.    Appearance: She is not ill-appearing, toxic-appearing or diaphoretic.  HENT:     Head: Normocephalic and atraumatic.  Cardiovascular:     Rate and Rhythm: Normal rate and regular rhythm.     Heart sounds: No murmur heard.   No friction rub. No gallop.  Pulmonary:     Effort: Pulmonary effort is normal. No respiratory distress.     Breath sounds: Normal breath sounds. No wheezing or rales.  Musculoskeletal:     Right lower leg: No edema.     Left lower leg: No edema.  Skin:    General: Skin is warm and dry.  Neurological:     Mental Status: She is alert and oriented to person, place, and time.  Psychiatric:        Behavior: Behavior is cooperative.     A/P: Syncope   constipation Suspect vasovagal with relation to severe fecal impaction. S/p milk and molasses enema Follow-up echo  Possible pressure ulcer Patient describes pain on her back side, that worsens when she moves.  She requested an ointment to place over it.  Was not able to visualize given patient was having a bowel movement.  Please consider foam pad.   - Orders reviewed. Labs for AM ordered, which was adjusted as needed.   Remainder of plan per day team/daily progress note.  Merrily Brittle, DO 08/09/2021, 2:58 AM PGY-1, [facility] Night Resident  Please page [phone] with questions.

## 2021-08-09 NOTE — ED Notes (Signed)
Pt incontinent of stool. Pt changed and cleaned. Pt had full linen change.

## 2021-08-09 NOTE — Discharge Summary (Addendum)
[facility] Discharge Summary  Patient name: Hayley Fisher Medical record number: [id number] Date of birth: [birth year] Age: 87 y.o. Gender: female Date of Admission: 08/08/2021  Date of Discharge: 08/09/2021 Admitting Physician: Martyn Malay, MD  Primary Care Provider: [name], MD Consultants: None  Indication for Hospitalization: Syncopal Episodes  Discharge Diagnoses/Problem List:  Principal Problem:   Syncope   Disposition: Home  Discharge Condition: Stable  Discharge Exam: From my same day progress note  Brief Hospital Course:  Abeera Flannery is a 87 y.o. female that presented with recurrent syncope. PMH is significant for HTN, T2DM, hypothyroidism, urinary incontinence with chronic indwelling urinary catheter.    Syncope, likely vasovagal secondary to constipation Patient presented after 2 syncopal episodes related to defecation. On admission, patient's labs were overall reassuring. CT head was negative for acute abnormality. CTA chest was negative for a PE. CT abdomen pelvis showed large colonic stool burden and patient endorsed hard, "marble-like" stool. Echocardiogram was completed to rule out cardiac etiology and showed G1DD with asymmetric LVH that was more consistent with HTN than an etiology that would contribute to syncope. Patient was given a milk and molasses enema with significant relief of the stool and improvement in symptoms. Patient was discharged with a bowel regimen of Miralax and Senna BID.  Wound/burn on R index finger Patient with superficial, partial thickness burn on right index finger. XR with potential subcutaneous emphysema involving webspace between 2nd and 3rd digits without suggestion of osteomyelitis. Hand surgery was consulted with no need for further evaluation. Wound care was consulted with recommendations for care while hospitalized. Hand surgery plans to follow this up on an outpatient basis.   Chronic  foley catheter Patient with chronic catheter, UA was abnormal and culture positive for Klebsiella spp, but we elected not treat as there was no evidence of systemic infection. Patient was mildly hypothermic on admission but resolved without intervention. Catheter was exchanged prior to discharge. Recommend re-evaluating if chronic catheter remains necessary.   T2DM Patient was placed on sliding scale insulin during admission and was continued upon home Lantus at discharge.  All other chronic conditions were treated with home medications as appropriate.    Issues for follow-up: Recommend urology follow-up to evaluate need for chronic catheter. Chronic catheter was exchanged prior to discharge. Recommend optimizing BP regimen given echocardiogram findings.    Significant Procedures: None  Significant Labs and Imaging:  Recent Labs  Lab 08/02/21 2127 08/08/21 1200 08/09/21 0459  WBC 5.3 5.7 3.0*  HGB 10.6* 11.2* 9.6*  HCT 34.2* 36.5 30.7*  PLT 209 146* 169   Recent Labs  Lab 08/02/21 2127 08/08/21 1200 08/09/21 0459  NA 136 138 133*  K 4.5 4.1 4.2  CL 100 103 102  CO2 22 25 26   GLUCOSE 219* 195* 297*  BUN 25* 14 21  CREATININE 1.02* 0.73 1.10*  CALCIUM 8.7* 8.8* 8.5*  MG  --  1.6* 2.0  ALKPHOS  --  54  --   AST  --  19  --   ALT  --  11  --   ALBUMIN  --  3.3*  --    EXAM: RIGHT INDEX FINGER 2+V   COMPARISON:  None.   FINDINGS: Diffuse soft tissue swelling about the index finger with potential minimal amount of subcutaneous emphysema involving the webspace between the second and third digits. No radiopaque foreign body. No discrete areas of osteolysis suggest osteomyelitis.   No fracture or dislocation. Joint spaces appear preserved  given obliquity. No discrete erosions.   IMPRESSION: Diffuse soft tissue swelling about the index finger with potential subcutaneous emphysema involving the webspace between the second and third digits without discrete area of  osteolysis to suggest osteomyelitis. Further evaluation with MRI could be performed as indicated.     Electronically Signed   By: Sandi Mariscal M.D.   On: 08/08/2021 13:24  EXAM: CT ABDOMEN AND PELVIS WITH CONTRAST   TECHNIQUE: Multidetector CT imaging of the abdomen and pelvis was performed using the standard protocol following bolus administration of intravenous contrast.   CONTRAST:  2mL OMNIPAQUE IOHEXOL 350 MG/ML SOLN   COMPARISON:  CT abdomen pelvis-07/27/2020   FINDINGS: Lower chest: Limited visualization of the lower thorax demonstrates minimal bibasilar subsegmental atelectasis, left greater than right. No discrete focal airspace opacities. No pleural effusion.   Cardiomegaly.  Coronary artery calcifications.   Hepatobiliary: Normal hepatic contour. There is diffuse decreased attenuation hepatic parenchyma suggestive of hepatic steatosis. No discrete hepatic lesions. Normal appearance of the gallbladder given degree distention. No radiopaque gallstones. No intra or extrahepatic biliary duct dilatation. No ascites.   Pancreas: Normal appearance of the pancreas.   Spleen: The spleen appears somewhat diminutive but otherwise normal in appearance.   Adrenals/Urinary Tract: There is symmetric enhancement and excretion of the bilateral kidneys. Note is made of an approximally 7.8 x 5.9 cm hypoattenuating (16 Hounsfield unit) partially exophytic left-sided renal cyst (representative image 18, series 10). No discrete right-sided renal lesions. No evidence of nephrolithiasis on this postcontrast examination. No urinary obstruction or perinephric stranding.   Normal appearance the bilateral adrenal glands.   The urinary bladder is decompressed with a Foley catheter though appears diffusely thick wall.   Stomach/Bowel: Large colonic stool burden without evidence of enteric obstruction. No discrete areas of bowel wall thickening. No significant hiatal hernia. No  pneumoperitoneum, pneumatosis or portal venous gas. The appendix is not visualized, however there is no pericecal inflammatory change. No definitive definable/drainable fluid collection within the abdomen or pelvis.   Vascular/Lymphatic: There is a moderate to large amount of predominantly calcified atherosclerotic plaque within normal caliber abdominal aorta, not resulting in hemodynamically significant stenosis. The major branch vessels of the abdominal aorta appear patent on this non CTA examination. Extensive calcified plaque involves the origin of the bilateral renal arteries, incompletely evaluated. Note is made of an infrarenal IVC filter. Scratch the no bulky retroperitoneal, mesenteric, pelvic or inguinal lymphadenopathy.   Reproductive: Post hysterectomy. No discrete adnexal lesions. No free fluid within the pelvic cul-de-sac.   Other: Diffuse body wall anasarca.   Musculoskeletal: No acute or aggressive osseous abnormalities. Moderate to severe multilevel lumbar spine DDD, worse at L1-L2, L4-L5 and L5-S1 with disc space height loss, endplate irregularity and sclerosis. Grade 1 anterolisthesis of L3 upon L4 without associated pars defects.   IMPRESSION: 1. No acute findings within the abdomen or pelvis. 2. Large colonic stool burden without evidence of enteric obstruction. 3. The urinary bladder is decompressed with a Foley catheter though appears diffusely thick walled. Correlation with urinalysis is advised. No evidence of urinary obstruction. 4. Suspected hepatic steatosis. 5.  Aortic Atherosclerosis (ICD10-I70.0).     Electronically Signed   By: Sandi Mariscal M.D.   On: 08/08/2021 12:16  EXAM: CT ANGIOGRAPHY CHEST WITH CONTRAST   TECHNIQUE: Multidetector CT imaging of the chest was performed using the standard protocol during bolus administration of intravenous contrast. Multiplanar CT image reconstructions and MIPs were obtained to evaluate the vascular  anatomy.   CONTRAST:  47mL OMNIPAQUE IOHEXOL 350 MG/ML SOLN   COMPARISON:  CT abdomen and pelvis-09/28/2019   FINDINGS: Vascular Findings:   There is adequate opacification of the pulmonary arterial system with the main pulmonary artery measuring 420 Hounsfield units. There are no discrete filling defects within the pulmonary arterial tree to suggest pulmonary embolism. Normal caliber of the main pulmonary artery.   Cardiomegaly. No pericardial effusion, though a small amount of fluid is seen with the pericardial recess. Coronary artery calcifications. Calcifications involving the aortic valve leaflets.   Scattered atherosclerotic plaque within a normal caliber thoracic aorta. No evidence of thoracic aortic dissection or perivascular stranding on this nongated examination.   Bovine configuration of the aortic arch. The branch vessels of the aortic arch appear patent throughout their imaged courses.   Review of the MIP images confirms the above findings.     ----------------------------------------------------------------------------------   Nonvascular Findings:   Mediastinum/Lymph Nodes: No bulky mediastinal, hilar or axillary lymphadenopathy.   Lungs/Pleura: Minimal dependent bibasilar subsegmental atelectasis. No discrete focal airspace opacities. No pleural effusion or pneumothorax. The central pulmonary airways appear patent. No discrete pulmonary nodules.   Upper abdomen: Limited early arterial phase evaluation of the upper abdomen suggest a large colonic stool burden.   Musculoskeletal: Diffuse body wall anasarca. No acute or aggressive osseous abnormalities. Mild multilevel thoracic spine DDD.   IMPRESSION: 1. No acute cardiopulmonary disease. Specifically, no evidence of pulmonary embolism. 2. Cardiomegaly. 3. Coronary artery calcifications. Aortic Atherosclerosis (ICD10-I70.0).     Electronically Signed   By: Sandi Mariscal M.D.   On: 08/08/2021  12:09  CLINICAL DATA:  Syncope   EXAM: CT HEAD WITHOUT CONTRAST   TECHNIQUE: Contiguous axial images were obtained from the base of the skull through the vertex without intravenous contrast.   COMPARISON:  CT examination dated February 20, 2021   FINDINGS: Brain: No evidence of acute infarction, hemorrhage, hydrocephalus, extra-axial collection or mass lesion/mass effect. Dystrophic calcification bilateral basal ganglia. Diffuse low-attenuation of the periventricular white matter chronic microvascular ischemic changes are unchanged. Empty sella, unchanged.   Vascular: Atherosclerotic calcification of bilateral carotid siphons and vertebral arteries. No hyperdense vessel or unexpected calcification.   Skull: Normal. Negative for fracture or focal lesion.   Sinuses/Orbits: Bilateral cataract surgery. Paranasal sinuses are clear.   Other: None.   IMPRESSION: No CT evidence of acute intracranial abnormality.   Additional chronic findings as above and unchanged.     Electronically Signed   By: Keane Police D.O.   On: 08/08/2021 12:06   ECHOCARDIOGRAM COMPLETE    ECHOCARDIOGRAM REPORT       Patient Name:   JALAN Weil Date of Exam: 08/09/2021 Medical Rec #:  [id number]      Height:       62.0 in Accession #:    [id number]     Weight:       130.0 lb Date of Birth:  March 02, 1931      BSA:          1.592 m Patient Age:    39 years       BP:           135/82 mmHg Patient Gender: F              HR:           92 bpm. Exam Location:  Inpatient  Procedure: 2D Echo, Cardiac Doppler and Color Doppler  Indications:    Syncope   History:        Patient  has no prior history of Echocardiogram examinations.                 Risk Factors:Hypertension and Diabetes.   Sonographer:    Jyl Heinz Referring Phys: [id number] CARINA M BROWN  IMPRESSIONS   1. Left ventricular ejection fraction, by estimation, is 60 to 65%. The left ventricle has normal function. The left ventricle has  no regional wall motion abnormalities. There is moderate asymmetric left ventricular hypertrophy of the basal-septal  segment. Left ventricular diastolic parameters are consistent with Grade I diastolic dysfunction (impaired relaxation).  2. Right ventricular systolic function is mildly reduced. The right ventricular size is normal. There is mildly elevated pulmonary artery systolic pressure. The estimated right ventricular systolic pressure is 76.1 mmHg.  3. The mitral valve is normal in structure. No evidence of mitral valve regurgitation.  4. The aortic valve was not well visualized. Aortic valve regurgitation is trivial. Aortic valve sclerosis/calcification is present, without any evidence of aortic stenosis.  5. The inferior vena cava is normal in size with greater than 50% respiratory variability, suggesting right atrial pressure of 3 mmHg.  FINDINGS  Left Ventricle: Left ventricular ejection fraction, by estimation, is 60 to 65%. The left ventricle has normal function. The left ventricle has no regional wall motion abnormalities. The left ventricular internal cavity size was small. There is moderate  asymmetric left ventricular hypertrophy of the basal-septal segment. Left ventricular diastolic parameters are consistent with Grade I diastolic dysfunction (impaired relaxation).  Right Ventricle: The right ventricular size is normal. Right vetricular wall thickness was not well visualized. Right ventricular systolic function is mildly reduced. There is mildly elevated pulmonary artery systolic pressure. The tricuspid regurgitant  velocity is 2.84 m/s, and with an assumed right atrial pressure of 3 mmHg, the estimated right ventricular systolic pressure is 95.0 mmHg.  Left Atrium: Left atrial size was normal in size.  Right Atrium: Right atrial size was normal in size.  Pericardium: There is no evidence of pericardial effusion.  Mitral Valve: The mitral valve is normal in structure. No  evidence of mitral valve regurgitation.  Tricuspid Valve: The tricuspid valve is normal in structure. Tricuspid valve regurgitation is trivial.  Aortic Valve: The aortic valve was not well visualized. Aortic valve regurgitation is trivial. Aortic regurgitation PHT measures 497 msec. Aortic valve sclerosis/calcification is present, without any evidence of aortic stenosis. Aortic valve peak  gradient measures 11.0 mmHg.  Pulmonic Valve: The pulmonic valve was not well visualized. Pulmonic valve regurgitation is not visualized.  Aorta: The aortic root and ascending aorta are structurally normal, with no evidence of dilitation.  Venous: The inferior vena cava is normal in size with greater than 50% respiratory variability, suggesting right atrial pressure of 3 mmHg.  IAS/Shunts: The interatrial septum was not well visualized.    LEFT VENTRICLE PLAX 2D LVIDd:         3.50 cm     Diastology LVIDs:         2.70 cm     LV e' medial:    5.11 cm/s LV PW:         1.20 cm     LV E/e' medial:  13.0 LV IVS:        1.30 cm     LV e' lateral:   6.64 cm/s LVOT diam:     2.00 cm     LV E/e' lateral: 10.0 LV SV:         63 LV SV Index:  39 LVOT Area:     3.14 cm   LV Volumes (MOD) LV vol d, MOD A2C: 81.6 ml LV vol d, MOD A4C: 96.5 ml LV vol s, MOD A2C: 33.3 ml LV vol s, MOD A4C: 40.8 ml LV SV MOD A2C:     48.3 ml LV SV MOD A4C:     96.5 ml LV SV MOD BP:      54.6 ml  RIGHT VENTRICLE            IVC RV Basal diam:  3.00 cm    IVC diam: 1.70 cm RV Mid diam:    2.50 cm RV S prime:     9.14 cm/s TAPSE (M-mode): 1.5 cm  LEFT ATRIUM             Index        RIGHT ATRIUM           Index LA diam:        3.50 cm 2.20 cm/m   RA Area:     12.40 cm LA Vol (A2C):   48.0 ml 30.15 ml/m  RA Volume:   22.10 ml  13.88 ml/m LA Vol (A4C):   44.7 ml 28.08 ml/m LA Biplane Vol: 46.7 ml 29.34 ml/m  AORTIC VALVE AV Area (Vmax): 1.89 cm AV Vmax:        166.00 cm/s AV Peak Grad:   11.0 mmHg LVOT Vmax:       100.00 cm/s LVOT Vmean:     76.800 cm/s LVOT VTI:       0.199 m AI PHT:         497 msec   AORTA Ao Root diam: 3.20 cm Ao Asc diam:  3.20 cm  MITRAL VALVE                TRICUSPID VALVE MV Area (PHT): 5.50 cm     TR Peak grad:   32.3 mmHg MV Decel Time: 138 msec     TR Vmax:        284.00 cm/s MV E velocity: 66.30 cm/s MV A velocity: 114.00 cm/s  SHUNTS MV E/A ratio:  0.58         Systemic VTI:  0.20 m                             Systemic Diam: 2.00 cm  Oswaldo Milian MD Electronically signed by Oswaldo Milian MD Signature Date/Time: 08/09/2021/11:14:37 AM      Final      Results/Tests Pending at Time of Discharge: None  Discharge Medications:  Allergies as of 08/09/2021       Reactions   Tramadol Itching   Takes occasionally with benadryl   Levemir [insulin Detemir] Itching        Medication List     STOP taking these medications    diphenhydrAMINE 25 MG tablet Commonly known as: BENADRYL   docusate sodium 100 MG capsule Commonly known as: COLACE   hydrOXYzine 10 MG tablet Commonly known as: ATARAX   traMADol 50 MG tablet Commonly known as: ULTRAM       TAKE these medications    Accu-Chek Aviva Plus test strip Generic drug: glucose blood 1 each by Other route See admin instructions. USE TO TEST BLOOD SUGAR UP  TO 3 TIMES A DAY.   Accu-Chek Softclix Lancets lancets 1 each by Other route See admin instructions. USE TO TEST BLOOD SUGAR UP  TO 3  TIMES A DAY.   aspirin 81 MG chewable tablet Chew 81 mg by mouth every morning.   atorvastatin 40 MG tablet Commonly known as: LIPITOR TAKE 1 TABLET BY MOUTH EVERY DAY   BD Pen Needle Nano U/F 32G X 4 MM Misc Generic drug: Insulin Pen Needle Use as directed with insulin   brimonidine 0.2 % ophthalmic solution Commonly known as: ALPHAGAN Place 1 drop into the left eye 2 (two) times daily.   Catheter Self-Adhesive Urinary Misc 1 patch by Does not apply route daily.   cetirizine  10 MG tablet Commonly known as: ZYRTEC Take 10 mg by mouth daily as needed for allergies.   clotrimazole-betamethasone cream Commonly known as: LOTRISONE Apply 1 application topically 2 (two) times daily as needed (rash/itching).   Debrox 6.5 % OTIC solution Generic drug: carbamide peroxide Place 2 drops into both ears 2 (two) times daily as needed (ear wax).   dorzolamide-timolol 22.3-6.8 MG/ML ophthalmic solution Commonly known as: COSOPT Place 1 drop into both eyes 2 (two) times daily.   DULoxetine 60 MG capsule Commonly known as: CYMBALTA Take 1 capsule (60 mg total) by mouth daily.   enalapril 20 MG tablet Commonly known as: VASOTEC Take 1 tablet (20 mg total) by mouth daily.   famotidine 20 MG tablet Commonly known as: Pepcid Take 1 tablet (20 mg total) by mouth daily.   ferrous sulfate 325 (65 FE) MG tablet Take 325 mg by mouth daily with breakfast.   hydrocortisone 2.5 % rectal cream Commonly known as: ANUSOL-HC Place 1 application rectally 2 (two) times daily as needed for hemorrhoids or anal itching. Please use twice daily x5 days, and then twice daily as needed.   insulin glargine 100 unit/mL Sopn Commonly known as: LANTUS Inject 10 Units into the skin daily. What changed: when to take this   Insulin Syringe-Needle U-100 31G X 5/16" 0.3 ML Misc Commonly known as: Sure Comfort Insulin Syringe Inject 1 Syringe into the skin daily.   ketoconazole 2 % cream Commonly known as: NIZORAL Apply 1 application topically daily as needed for irritation.   levothyroxine 50 MCG tablet Commonly known as: SYNTHROID Take 1 tablet (50 mcg total) by mouth daily.   metFORMIN 500 MG 24 hr tablet Commonly known as: GLUCOPHAGE-XR Take 2 tablets (1,000 mg total) by mouth daily with breakfast.   Myrbetriq 25 MG Tb24 tablet Generic drug: mirabegron ER TAKE 1 TABLET BY MOUTH DAILY What changed:  how much to take when to take this   ondansetron 4 MG disintegrating  tablet Commonly known as: Zofran ODT Take 1 tablet (4 mg total) by mouth every 8 (eight) hours as needed for nausea.   polyethylene glycol powder 17 GM/SCOOP powder Commonly known as: GLYCOLAX/MIRALAX Take 17 g by mouth in the morning and at bedtime. What changed: when to take this   PreserVision AREDS 2+Multi Vit Caps Take 1 capsule by mouth daily.   Rybelsus 3 MG Tabs Generic drug: Semaglutide Take 3 mg by mouth daily.   Sarna lotion Generic drug: camphor-menthol Apply 1 application topically 4 (four) times daily as needed for itching.   senna 8.6 MG Tabs tablet Commonly known as: SENOKOT Take 1 tablet (8.6 mg total) by mouth 2 (two) times daily. What changed: when to take this   tamsulosin 0.4 MG Caps capsule Commonly known as: FLOMAX Take 0.4 mg by mouth every morning.   Transfer Board Misc Use as needed for transfer from bed to chair or vice versa   Vyzulta  0.024 % Soln Generic drug: Latanoprostene Bunod Place 1 drop into both eyes at bedtime.   witch hazel-glycerin pad Commonly known as: TUCKS Apply 1 application topically 2 (two) times daily as needed for itching.        Discharge Instructions: Please refer to Patient Instructions section of EMR for full details.  Patient was counseled important signs and symptoms that should prompt return to medical care, changes in medications, dietary instructions, activity restrictions, and follow up appointments.   Follow-Up Appointments:  Follow-up Information     Sherilyn Cooter, MD. Call.   Specialty: Orthopedic Surgery Why: Please call Dr. Madelynn Done office and make an appointment to be seen for the burn on your hand Contact information: [address] [address] [phone]                 Eppie Gibson, MD 08/09/2021, 4:04 PM PGY-1, [place] Upper-Level Resident Addendum   I have independently interviewed and examined the patient. I have discussed  the above with the original author and agree with their documentation. My edits for correction/addition/clarification are in within the document. Please see also any attending notes.   Rise Patience, DO  PGY-2, [facility] 08/09/2021 4:26 PM  [facility] Service pager: [phone] (text pages welcome through [facility])

## 2021-08-09 NOTE — Discharge Summary (Shared)
[facility] Discharge Summary  Patient name: Hayley Fisher Medical record number: [id number] Date of birth: September 01, 1930 Age: 87 y.o. Gender: female Date of Admission: 08/08/2021  Date of Discharge: 08/09/2021 Admitting Physician: Martyn Malay, MD  Primary Care Provider: [name], MD Consultants: None  Indication for Hospitalization: ***  Discharge Diagnoses/Problem List:  ***  Disposition: ***  Discharge Condition: ***  Discharge Exam: ***  Brief Hospital Course:  Hayley Fisher is a 87 y.o. female that presented with recurrent syncope. PMH is significant for HTN, T2DM, hypothyroidism, urinary incontinence with chronic indwelling urinary catheter.    Syncope, likely vasovagal secondary to constipation Patient presented after 2 syncopal episodes related to defecation. On admission, patient's labs were overall reassuring. CT head was negative for acute abnormality. CTA chest was negative for a PE. CT abdomen pelvis showed large colonic stool burden. Echocardiogram was completed to rule out cardiac etiology and showed G1DD with asymmetric LVH that was more consistent with HTN than an etiology that would contribute to syncope. Patient was given a milk and molasses enema with significant relief of the stool and improvement in symptoms. Patient was discharged with a bowel regimen of ***.  Wound/burn on R index finger Patient with superficial, partial thickness burn on right index finger. XR with potential subcutaneous emphysema involving webspace between 2nd and 3rd digits without suggestion of osteomyelitis. Hand surgery was consulted with no need for further evaluation. Wound care was consulted with recommendations for care.   Chronic foley catheter Patient with chronic catheter, UA was abnormal but will not treat at this time as there is no evidence of systemic infection. Patient was mildly hypothermic on admission but resolved without intervention.  Catheter was exchanged prior to discharge. Recommend re-evaluating if chronic catheter remains necessary.   T2DM Patient was placed on sliding scale insulin during admission and was continued upon home Lantus at discharge.    All other chronic conditions were treated with home medications as appropriate.     Issues for follow-up: Recommend urology follow-up to evaluate need for chronic catheter. Chronic catheter was exchanged prior to discharge. Recommend optimizing BP regimen given echocardiogram findings.    Issues for Follow Up:  ***  Significant Procedures: ***  Significant Labs and Imaging:  Recent Labs  Lab 08/02/21 2127 08/08/21 1200 08/09/21 0459  WBC 5.3 5.7 3.0*  HGB 10.6* 11.2* 9.6*  HCT 34.2* 36.5 30.7*  PLT 209 146* 169   Recent Labs  Lab 08/02/21 2127 08/08/21 1200 08/09/21 0459  NA 136 138 133*  K 4.5 4.1 4.2  CL 100 103 102  CO2 22 25 26   GLUCOSE 219* 195* 297*  BUN 25* 14 21  CREATININE 1.02* 0.73 1.10*  CALCIUM 8.7* 8.8* 8.5*  MG  --  1.6* 2.0  ALKPHOS  --  54  --   AST  --  19  --   ALT  --  11  --   ALBUMIN  --  3.3*  --     ***  Results/Tests Pending at Time of Discharge: ***  Discharge Medications:  Allergies as of 08/09/2021       Reactions   Tramadol Itching   Takes occasionally with benadryl   Levemir [insulin Detemir] Itching        Medication List     STOP taking these medications    diphenhydrAMINE 25 MG tablet Commonly known as: BENADRYL   docusate sodium 100 MG capsule Commonly known as: COLACE   hydrOXYzine 10 MG  tablet Commonly known as: ATARAX   traMADol 50 MG tablet Commonly known as: ULTRAM       TAKE these medications    Accu-Chek Aviva Plus test strip Generic drug: glucose blood 1 each by Other route See admin instructions. USE TO TEST BLOOD SUGAR UP  TO 3 TIMES A DAY.   Accu-Chek Softclix Lancets lancets 1 each by Other route See admin instructions. USE TO TEST BLOOD SUGAR UP  TO 3  TIMES A DAY.   aspirin 81 MG chewable tablet Chew 81 mg by mouth every morning.   atorvastatin 40 MG tablet Commonly known as: LIPITOR TAKE 1 TABLET BY MOUTH EVERY DAY   BD Pen Needle Nano U/F 32G X 4 MM Misc Generic drug: Insulin Pen Needle Use as directed with insulin   brimonidine 0.2 % ophthalmic solution Commonly known as: ALPHAGAN Place 1 drop into the left eye 2 (two) times daily.   Catheter Self-Adhesive Urinary Misc 1 patch by Does not apply route daily.   cetirizine 10 MG tablet Commonly known as: ZYRTEC Take 10 mg by mouth daily as needed for allergies.   clotrimazole-betamethasone cream Commonly known as: LOTRISONE Apply 1 application topically 2 (two) times daily as needed (rash/itching).   Debrox 6.5 % OTIC solution Generic drug: carbamide peroxide Place 2 drops into both ears 2 (two) times daily as needed (ear wax).   dorzolamide-timolol 22.3-6.8 MG/ML ophthalmic solution Commonly known as: COSOPT Place 1 drop into both eyes 2 (two) times daily.   DULoxetine 60 MG capsule Commonly known as: CYMBALTA Take 1 capsule (60 mg total) by mouth daily.   enalapril 20 MG tablet Commonly known as: VASOTEC Take 1 tablet (20 mg total) by mouth daily.   famotidine 20 MG tablet Commonly known as: Pepcid Take 1 tablet (20 mg total) by mouth daily.   ferrous sulfate 325 (65 FE) MG tablet Take 325 mg by mouth daily with breakfast.   hydrocortisone 2.5 % rectal cream Commonly known as: ANUSOL-HC Place 1 application rectally 2 (two) times daily as needed for hemorrhoids or anal itching. Please use twice daily x5 days, and then twice daily as needed.   insulin glargine 100 unit/mL Sopn Commonly known as: LANTUS Inject 10 Units into the skin daily. What changed: when to take this   Insulin Syringe-Needle U-100 31G X 5/16" 0.3 ML Misc Commonly known as: Sure Comfort Insulin Syringe Inject 1 Syringe into the skin daily.   ketoconazole 2 % cream Commonly known  as: NIZORAL Apply 1 application topically daily as needed for irritation.   levothyroxine 50 MCG tablet Commonly known as: SYNTHROID Take 1 tablet (50 mcg total) by mouth daily.   metFORMIN 500 MG 24 hr tablet Commonly known as: GLUCOPHAGE-XR Take 2 tablets (1,000 mg total) by mouth daily with breakfast.   Myrbetriq 25 MG Tb24 tablet Generic drug: mirabegron ER TAKE 1 TABLET BY MOUTH DAILY What changed:  how much to take when to take this   ondansetron 4 MG disintegrating tablet Commonly known as: Zofran ODT Take 1 tablet (4 mg total) by mouth every 8 (eight) hours as needed for nausea.   polyethylene glycol powder 17 GM/SCOOP powder Commonly known as: GLYCOLAX/MIRALAX Take 17 g by mouth in the morning and at bedtime. What changed: when to take this   PreserVision AREDS 2+Multi Vit Caps Take 1 capsule by mouth daily.   Rybelsus 3 MG Tabs Generic drug: Semaglutide Take 3 mg by mouth daily.   Sarna lotion Generic drug: camphor-menthol  Apply 1 application topically 4 (four) times daily as needed for itching.   senna 8.6 MG Tabs tablet Commonly known as: SENOKOT Take 1 tablet (8.6 mg total) by mouth 2 (two) times daily. What changed: when to take this   tamsulosin 0.4 MG Caps capsule Commonly known as: FLOMAX Take 0.4 mg by mouth every morning.   Transfer Board Misc Use as needed for transfer from bed to chair or vice versa   Vyzulta 0.024 % Soln Generic drug: Latanoprostene Bunod Place 1 drop into both eyes at bedtime.   witch hazel-glycerin pad Commonly known as: TUCKS Apply 1 application topically 2 (two) times daily as needed for itching.        Discharge Instructions: Please refer to Patient Instructions section of EMR for full details.  Patient was counseled important signs and symptoms that should prompt return to medical care, changes in medications, dietary instructions, activity restrictions, and follow up appointments.   Follow-Up Appointments:   Follow-up Information     Sherilyn Cooter, MD. Call.   Specialty: Orthopedic Surgery Why: Please call Dr. Madelynn Done office and make an appointment to be seen for the burn on your hand Contact information: [address] [phone]                 Eppie Gibson, MD 08/09/2021, 12:06 PM PGY-1, [place]

## 2021-08-09 NOTE — Progress Notes (Signed)
Family Medicine Teaching Service Daily Progress Note Intern Pager: [phone]  Patient name: Hayley Fisher Medical record number: [id number] Date of birth: Jun 06, 1931 Age: 87 y.o. Gender: female  Primary Care Provider: [name], MD Consultants: None Code Status: Partial, no shock or intubation, yes to CPR and ACLS meds  Pt Overview and Major Events to Date:  12/19- admitted  Assessment and Plan: Hayley Fisher is a 87 y.o. female presenting with recurrent syncope. PMH is significant for HTN, T2DM, hypothyroidism, urinary incontinence with chronic indwelling urinary catheter.   Syncope   Constipation Remained stable overnight. The only outstanding piece of her syncope workup is a TTE, to be done today. If workup continues to be negative, presume vasovagal syncope secondary to straining to stool to be underlying cause of syncope. Received milk & molasses enema around 1900 and had large volume stool output (though not reflected in the chart). Patient feels stable and at baseline.  - Follow-up Echocardiogram - MiraLAX and Senna daily, will need to be continued on discharge -Potential discharge later today if echo is normal  R Finger Burn WOC saw yesterday with recommendations for wound care. No evidence of infection on exam, vitals, or labs.  - Wound care per WOC recs - Outpt follow-up with Dr. Tempie Donning, hand surgery  Chronic foley catheter Urine cx obtained yesterday from foley, will not treat as valid result. No evidence of systemic infection. WBC 3.0. Hypothermia that was present on admission has since resolved, no tachycardia.  - Exchange catheter - F/u blood cx  DM2 Glucose on am BMP was 297. Patient currently receiving only home LAI dose of 10u daily.  - Continue CBG monitoring - Add sSSI  ?Pressure Ulcer Examined patient on my rounds today, no evidence of pressure ulcer, but there is a skin tag in the location that the patient was complaining of irritation, suspect that  this is the cause of her symptoms. However, visualization of the low back was obscured by stool. - Will have RN clean patient and re-examine back for possible ulcer  Hypothyroidism - Continue home Levothyroxine  Glaucoma - Continue home Latanoprostene, brimonidine, and Cosopt drops  Hyperlipidemia - Continue home atorvastatin 40mg  daily  FEN/GI: Regular diet PPx: Lovenox Dispo:Home today if + stool output and negative echo.   Subjective:  Hayley Fisher feels well this morning.  Her only complaint is having been stuck in the ED overnight.  She feels up to going home today if there is no further work-up necessary for her syncope.  Objective: Temp:  [96.2 F (35.7 C)-98.3 F (36.8 C)] 98.3 F (36.8 C) (12/18 2258) Pulse Rate:  [86-100] 89 (12/19 0444) Resp:  [13-25] 18 (12/19 0444) BP: (122-176)/(67-100) 169/94 (12/19 0444) SpO2:  [95 %-100 %] 100 % (12/19 0444) Weight:  [59 kg] 59 kg (12/18 1109) Physical Exam: General: Frail, elderly female, lying in bed, copious stool noted on Chux under patient Cardiovascular: Regular rate, regular rhythm, no murmur/rub/gallop Respiratory: Normal work of breathing on room air, lungs clear to auscultation bilaterally Abdomen: Abdomen is soft, nontender, nondistended, no palpable stool, and improvement from previous exam Extremities: Right index finger with clean and dry bandage in place, trace nonpitting pedal edema bilaterally  Laboratory: Recent Labs  Lab 08/02/21 2127 08/08/21 1200 08/09/21 0459  WBC 5.3 5.7 3.0*  HGB 10.6* 11.2* 9.6*  HCT 34.2* 36.5 30.7*  PLT 209 146* 169   Recent Labs  Lab 08/02/21 2127 08/08/21 1200 08/09/21 0459  NA 136 138 133*  K 4.5 4.1 4.2  CL 100 103 102  CO2 22 25 26   BUN 25* 14 21  CREATININE 1.02* 0.73 1.10*  CALCIUM 8.7* 8.8* 8.5*  PROT  --  6.1*  --   BILITOT  --  0.8  --   ALKPHOS  --  54  --   ALT  --  11  --   AST  --  19  --   GLUCOSE 219* 195* 297*    Imaging/Diagnostic  Tests: Echo pending  Eppie Gibson, MD 08/09/2021, 6:49 AM PGY-1, [place] Intern pager: [phone], text pages welcome

## 2021-08-10 ENCOUNTER — Telehealth: Payer: Self-pay

## 2021-08-10 ENCOUNTER — Other Ambulatory Visit (HOSPITAL_COMMUNITY): Payer: Self-pay

## 2021-08-10 LAB — URINE CULTURE: Culture: >=100000 — AB

## 2021-08-10 NOTE — Telephone Encounter (Signed)
Pts daughter called wanting to know if she can come in Thursday instead of Friday she stated that Friday at 28 was too early and they wouldn't be able to get her mom here on time..   Please advise if we can move this apt to Thursday afternoon instead.

## 2021-08-11 ENCOUNTER — Ambulatory Visit: Payer: Medicare Other

## 2021-08-11 ENCOUNTER — Telehealth: Payer: Self-pay | Admitting: Student

## 2021-08-11 NOTE — Telephone Encounter (Signed)
Moved to 1:30 on 08/12/21, I advised that they were being worked in and that they may have to wait for a little while and she understands this

## 2021-08-11 NOTE — Telephone Encounter (Signed)
Patients daughter dropped off form at front desk for Caring for her mom who is in a wheelchair.  Verified that patient section of form has been completed.  Last DOS with PCP was 07/14/2021.  Placed form in [facility] team folder to be completed by clinical staff.  [place] Silos

## 2021-08-11 NOTE — Chronic Care Management (AMB) (Signed)
Chronic Care Management   CCM RN Visit Note  08/11/2021 Name: Hayley Fisher MRN: [id number] DOB: [birth year]  Subjective: Hayley Fisher is a 87 y.o. year old female who is a primary care patient of [name], MD. The care management team was consulted for assistance with disease management and care coordination needs.    Engaged with patient by telephone for follow up visit in response to provider referral for case management and/or care coordination services.   Consent to Services:  The patient was given information about Chronic Care Management services, agreed to services, and gave verbal consent prior to initiation of services.  Please see initial visit note for detailed documentation.   Patient agreed to services and verbal consent obtained.    Assessment: Patient's daughter Hayley Fisher provided all information during this encounter. She states that the patient is doing better and bathroom habits are back on schedule . See Care Plan below for interventions and patient self-care actives. Follow up Plan: Patient would like continued follow-up.  CCM RNCM will outreach the patient within the next 5 weeks.  Patient will call office if needed prior to next encounter Review of patient past medical history, allergies, medications, health status, including review of consultants reports, laboratory and other test data, was performed as part of comprehensive evaluation and provision of chronic care management services.   SDOH (Social Determinants of Health) assessments and interventions performed:    CCM Care Plan  Allergies  Allergen Reactions   Tramadol Itching    Takes occasionally with benadryl   Levemir [Insulin Detemir] Itching    Outpatient Encounter Medications as of 08/11/2021  Medication Sig   Accu-Chek Softclix Lancets lancets 1 each by Other route See admin instructions. USE TO TEST BLOOD SUGAR UP  TO 3 TIMES A DAY.   aspirin 81 MG chewable tablet Chew 81 mg by mouth  every morning.   atorvastatin (LIPITOR) 40 MG tablet TAKE 1 TABLET BY MOUTH EVERY DAY (Patient taking differently: Take 40 mg by mouth daily.)   brimonidine (ALPHAGAN) 0.2 % ophthalmic solution Place 1 drop into the left eye 2 (two) times daily.   carbamide peroxide (DEBROX) 6.5 % OTIC solution Place 2 drops into both ears 2 (two) times daily as needed (ear wax).   Catheter Self-Adhesive Urinary MISC 1 patch by Does not apply route daily.   cetirizine (ZYRTEC) 10 MG tablet Take 10 mg by mouth daily as needed for allergies.   clotrimazole-betamethasone (LOTRISONE) cream Apply 1 application topically 2 (two) times daily as needed (rash/itching).   dorzolamide-timolol (COSOPT) 22.3-6.8 MG/ML ophthalmic solution Place 1 drop into both eyes 2 (two) times daily.   DULoxetine (CYMBALTA) 60 MG capsule Take 1 capsule (60 mg total) by mouth daily.   enalapril (VASOTEC) 20 MG tablet Take 1 tablet (20 mg total) by mouth daily.   famotidine (PEPCID) 20 MG tablet Take 1 tablet (20 mg total) by mouth daily.   ferrous sulfate 325 (65 FE) MG tablet Take 325 mg by mouth daily with breakfast.   glucose blood (ACCU-CHEK AVIVA PLUS) test strip 1 each by Other route See admin instructions. USE TO TEST BLOOD SUGAR UP  TO 3 TIMES A DAY.   hydrocortisone (ANUSOL-HC) 2.5 % rectal cream Place 1 application rectally 2 (two) times daily as needed for hemorrhoids or anal itching. Please use twice daily x5 days, and then twice daily as needed.   insulin glargine (LANTUS) 100 unit/mL SOPN Inject 10 Units into the skin daily. (Patient taking differently: Inject 10  Units into the skin daily before breakfast.)   Insulin Pen Needle 32G X 4 MM MISC Use as directed with insulin   Insulin Syringe-Needle U-100 (SURE COMFORT INSULIN SYRINGE) 31G X 5/16" 0.3 ML MISC Inject 1 Syringe into the skin daily.   ketoconazole (NIZORAL) 2 % cream Apply 1 application topically daily as needed for irritation.   Latanoprostene Bunod (VYZULTA) 0.024 %  SOLN Place 1 drop into both eyes at bedtime.   levothyroxine (SYNTHROID) 50 MCG tablet Take 1 tablet (50 mcg total) by mouth daily.   metFORMIN (GLUCOPHAGE-XR) 500 MG 24 hr tablet Take 2 tablets (1,000 mg total) by mouth daily with breakfast.   Misc. Devices (TRANSFER BOARD) MISC Use as needed for transfer from bed to chair or vice versa   Multiple Vitamins-Minerals (PRESERVISION AREDS 2+MULTI VIT) CAPS Take 1 capsule by mouth daily.   MYRBETRIQ 25 MG TB24 tablet TAKE 1 TABLET BY MOUTH DAILY (Patient taking differently: Take 25 mg by mouth every morning.)   ondansetron (ZOFRAN ODT) 4 MG disintegrating tablet Take 1 tablet (4 mg total) by mouth every 8 (eight) hours as needed for nausea.   polyethylene glycol powder (GLYCOLAX/MIRALAX) 17 GM/SCOOP powder Take 17 g by mouth in the morning and at bedtime.   SARNA lotion Apply 1 application topically 4 (four) times daily as needed for itching.   Semaglutide (RYBELSUS) 3 MG TABS Take 3 mg by mouth daily.   senna (SENOKOT) 8.6 MG TABS tablet Take 1 tablet (8.6 mg total) by mouth 2 (two) times daily.   tamsulosin (FLOMAX) 0.4 MG CAPS capsule Take 0.4 mg by mouth every morning.   witch hazel-glycerin (TUCKS) pad Apply 1 application topically 2 (two) times daily as needed for itching.   No facility-administered encounter medications on file as of 08/11/2021.    Patient Active Problem List   Diagnosis Date Noted   Syncope 08/08/2021   Incontinence 07/14/2021   Pressure injury of right buttock, stage 2 ([place]) 07/14/2021   COVID-19 virus infection 06/27/2021   Normocytic anemia 06/27/2021   Leukocytopenia 06/27/2021   Asymptomatic bacteriuria 06/27/2021   Malnutrition of moderate degree 02/23/2021   AMS (altered mental status) 02/21/2021   Cognitive impairment 61/44/3154   Acute metabolic encephalopathy 00/86/7619   Sepsis ([place]) 01/15/2021   Acute encephalopathy 12/09/2020   Hyperbilirubinemia 12/09/2020   Hypothyroidism 12/09/2020   Memory  changes 10/20/2020   Acute on chronic anemia 09/21/2020   Gastroesophageal reflux disease without esophagitis 09/16/2020   Urinary frequency 09/11/2020   Stage 1 decubitus ulcer 07/30/2020   Uncontrolled type 2 diabetes mellitus with hyperglycemia ([place]) 07/02/2020   Unstable gait 04/03/2018   Frequent falls 04/03/2018   Age-related physical debility 03/22/2018   Hematuria 01/29/2018   Skin ulcer of sacrum, limited to breakdown of skin ([place]) 03/16/2017   Spinal stenosis of lumbar region 01/13/2016   Chronic venous insufficiency 11/09/2015   DDD (degenerative disc disease), lumbosacral 11/24/2014   Constipation by delayed colonic transit 11/24/2014   Urinary tract infection without hematuria 07/03/2014   Frequent UTI 10/29/2012   Pruritus 03/17/2011   Type 2 diabetes mellitus with diabetic neuropathy ([place]) 10/19/2006   Hyperlipidemia associated with type 2 diabetes mellitus ([place]) 10/19/2006   Hypertension associated with diabetes ([place]) 10/19/2006   Osteopenia 10/19/2006    Conditions to be addressed/monitored:DMII  Care Plan : Diabetes Type 2 (Adult)  Updates made by Lazaro Arms, RN since 08/11/2021 12:00 AM     Problem: Glycemic Management (Diabetes, Type 2)  Long-Range Goal: Glycemic Management Optimized   Start Date: 03/10/2021  Expected End Date: 09/21/2021  Recent Progress: On track  Priority: High  Note:   urrent Barriers:  Knowledge Deficits related to plan of care for management of DMII  Chronic Disease Management support and education needs related to DMII  RNCM Clinical Goal(s):  Patient/Daughter will verbalize understanding of plan for management of DMII through collaboration with RN Care manager, provider, and care team.   Interventions: 1:1 collaboration with primary care provider regarding development and update of comprehensive plan of care as evidenced by provider attestation and co-signature Inter-disciplinary care team collaboration (see longitudinal  plan of care)   Diabetes:  (Status: Goal on track: YES.) Lab Results  Component Value Date   HGBA1C 8.0 (A) 06/09/2021  Assessed patient's/daughter understanding of A1c goal:  patient will remain at current level. Reviewed medications with patient and discussed importance of medication adherence; Discussed plans with patient for ongoing care management follow up and provided patient with direct contact information for care management team; Provided patient/daughter with verbal information regarding hypo and hyperglycemia and importance of correct treatment; Advised patient, providing education and rationale, to check CBG and record, calling the office for findings outside established parameters; 08/11/21: I spoke with Hayley Fisher, and she stated that Hayley Fisher is doing better. She had two episodes that she was in the ED for being Impacted. Hayley Fisher usually takes Miralax daily, and Senokot and her sister only give it to her every other day, which causes her to become impacted. She is back on schedule now, and her bowels are fine. She said her blood sugars are doing well and have ranged between 130-278. She is taking her medication. She received the incontinence supplies. They bought her a memory foam mattress to go on her bed, so she no longer needs a new bed.  Patient Goals/Self Care Activities: -Patient/Caregiver will self-administer medications as prescribed as evidenced by self-report/primary caregiver report ,  -Patient/Caregiver will attend all scheduled provider appointments as evidenced by clinician review of documented attendance to scheduled appointments and patient/caregiver report, -Patient/Caregiver will call pharmacy for medication refills as evidenced by patient report and review of pharmacy fill history as appropriate,  -Patient/Caregiver will call provider office for new concerns or questions as evidenced by review of documented incoming telephone call notes and patient report,   -Patient/Caregiver verbalizes understanding of plan,  -Patient/Caregiver will focus on medication adherence by taking medications as prescribed -check blood sugar at prescribed times -check blood sugar if I feel it is too high or too low         Lazaro Arms RN, BSN, [place] Phone: [phone] Fax: [phone]

## 2021-08-11 NOTE — Telephone Encounter (Signed)
Pt daughter called again.   CB [phone]

## 2021-08-11 NOTE — Patient Instructions (Signed)
Visit Information  Ms. Donlon  it was nice speaking with you. Please call me directly [phone] if you have questions about the goals we discussed.    Patient Goals/Self Care Activities: -Patient/Caregiver will self-administer medications as prescribed as evidenced by self-report/primary caregiver report ,  -Patient/Caregiver will attend all scheduled provider appointments as evidenced by clinician review of documented attendance to scheduled appointments and patient/caregiver report, -Patient/Caregiver will call pharmacy for medication refills as evidenced by patient report and review of pharmacy fill history as appropriate,  -Patient/Caregiver will call provider office for new concerns or questions as evidenced by review of documented incoming telephone call notes and patient report,  -Patient/Caregiver verbalizes understanding of plan,  -Patient/Caregiver will focus on medication adherence by taking medications as prescribed -check blood sugar at prescribed times -check blood sugar if I feel it is too high or too low    The patient verbalized understanding of instructions, educational materials, and care plan provided today and declined offer to receive copy of patient instructions, educational materials, and care plan.   Follow up Plan: Patient would like continued follow-up.  CCM RNCM will outreach the patient within the next 5 weeks.  Patient will call office if needed prior to next encounter  Lazaro Arms, RN  [phone]

## 2021-08-12 ENCOUNTER — Ambulatory Visit (INDEPENDENT_AMBULATORY_CARE_PROVIDER_SITE_OTHER): Payer: Medicare Other | Admitting: Orthopedic Surgery

## 2021-08-12 ENCOUNTER — Other Ambulatory Visit: Payer: Self-pay

## 2021-08-12 ENCOUNTER — Encounter: Payer: Self-pay | Admitting: Orthopedic Surgery

## 2021-08-12 DIAGNOSIS — S61200A Unspecified open wound of right index finger without damage to nail, initial encounter: Secondary | ICD-10-CM | POA: Insufficient documentation

## 2021-08-12 NOTE — Progress Notes (Signed)
Office Visit Note   Patient: Hayley Fisher           Date of Birth: May 24, 1931           MRN: [id number] Visit Date: 08/12/2021              Requested by: [name], MD [address] [place],  [place] [address] PCP: [name], MD   Assessment & Plan: Visit Diagnoses:  1. Open wound of right index finger     Plan: Discussed with patient and family that wound appears superficial at this point.  There is no bony or joint involvement.  It likely needs continued wound care to try to get the wound to heal.  Will refer to a wound care clinic.  We discussed daily wound care with bacitracin, gauze, and loose Coban in the mean time.   Follow-Up Instructions: No follow-ups on file.   Orders:  No orders of the defined types were placed in this encounter.  No orders of the defined types were placed in this encounter.     Procedures: No procedures performed   Clinical Data: No additional findings.   Subjective: Chief Complaint  Patient presents with   Right Hand - Follow-up    This is an 87 year old right-hand-dominant female who presents for follow-up from the ER with a right index finger burn.  She is not sure how it happened.  She does have diabetes with questionable sensation.  Daughter with whom the patient lives thinks maybe she picked up a hot plate or hot cooking vessel.  She was recently admitted for syncopal episodes.  She denies any pain in this finger.  She denies any systemic symptoms today.   Review of Systems   Objective: Vital Signs: There were no vitals taken for this visit.  Physical Exam  Right Hand Exam   Other  Pulse: present  Comments:  Superficial burn involving radial aspect of index finger from proximal phalanx to DIP joint.  No exposed bone or joint.  No exposed tendon.  Limited ROM of fingers at baseline. Finger warm and well perfused.      Specialty Comments:  No specialty comments available.  Imaging: No results  found.   PMFS History: Patient Active Problem List   Diagnosis Date Noted   Open wound of right index finger 08/12/2021   Syncope 08/08/2021   Incontinence 07/14/2021   Pressure injury of right buttock, stage 2 ([place]) 07/14/2021   COVID-19 virus infection 06/27/2021   Normocytic anemia 06/27/2021   Leukocytopenia 06/27/2021   Asymptomatic bacteriuria 06/27/2021   Malnutrition of moderate degree 02/23/2021   AMS (altered mental status) 02/21/2021   Cognitive impairment 62/10/5595   Acute metabolic encephalopathy 41/63/8453   Sepsis ([place]) 01/15/2021   Acute encephalopathy 12/09/2020   Hyperbilirubinemia 12/09/2020   Hypothyroidism 12/09/2020   Memory changes 10/20/2020   Acute on chronic anemia 09/21/2020   Gastroesophageal reflux disease without esophagitis 09/16/2020   Urinary frequency 09/11/2020   Stage 1 decubitus ulcer 07/30/2020   Uncontrolled type 2 diabetes mellitus with hyperglycemia ([place]) 07/02/2020   Unstable gait 04/03/2018   Frequent falls 04/03/2018   Age-related physical debility 03/22/2018   Hematuria 01/29/2018   Skin ulcer of sacrum, limited to breakdown of skin ([place]) 03/16/2017   Spinal stenosis of lumbar region 01/13/2016   Chronic venous insufficiency 11/09/2015   DDD (degenerative disc disease), lumbosacral 11/24/2014   Constipation by delayed colonic transit 11/24/2014   Urinary tract infection without hematuria 07/03/2014  Frequent UTI 10/29/2012   Pruritus 03/17/2011   Type 2 diabetes mellitus with diabetic neuropathy ([place]) 10/19/2006   Hyperlipidemia associated with type 2 diabetes mellitus ([place]) 10/19/2006   Hypertension associated with diabetes ([place]) 10/19/2006   Osteopenia 10/19/2006   Past Medical History:  Diagnosis Date   Arthritis    Arthritis    Cataract    bil cateracts removed   Chronic kidney disease    "spot on one of my kidneys" per pt   Diabetes mellitus    Glaucoma    Hyperkalemia 07/02/2020   Hyperlipidemia     Hyperplastic colon polyp    Hypertension    Hypothyroidism 03/24/2121   Metabolic acidosis 48/25/0037   Syncope    Thyroid disease     Family History  Problem Relation Age of Onset   Colon cancer Mother    Other Father        cerebral hemorrhage   Diabetes Brother    Diabetes Sister        x 2   Bone cancer Daughter    Breast cancer Daughter 1   Esophageal cancer Neg Hx    Rectal cancer Neg Hx    Stomach cancer Neg Hx     Past Surgical History:  Procedure Laterality Date   ABDOMINAL HYSTERECTOMY     BLADDER SUSPENSION     bladder tacking     COLONOSCOPY     EYE SURGERY     INGUINAL HERNIA REPAIR Right    IR IVC FILTER PLMT / S&I /IMG GUID/MOD SED  01/16/2021   Social History   Occupational History   Occupation: retired  Tobacco Use   Smoking status: Former    Types: Cigarettes    Quit date: 11/29/1980    Years since quitting: 40.7   Smokeless tobacco: Never  Vaping Use   Vaping Use: Never used  Substance and Sexual Activity   Alcohol use: No    Alcohol/week: 0.0 standard drinks   Drug use: No   Sexual activity: Yes    Birth control/protection: None

## 2021-08-13 ENCOUNTER — Ambulatory Visit: Payer: Medicare Other | Admitting: Orthopedic Surgery

## 2021-08-13 ENCOUNTER — Ambulatory Visit: Payer: Medicare Other | Admitting: Family Medicine

## 2021-08-13 LAB — CULTURE, BLOOD (ROUTINE X 2)
Culture: NO GROWTH
Culture: NO GROWTH
Special Requests: ADEQUATE
Special Requests: ADEQUATE

## 2021-08-17 DIAGNOSIS — M48061 Spinal stenosis, lumbar region without neurogenic claudication: Secondary | ICD-10-CM | POA: Diagnosis not present

## 2021-08-17 DIAGNOSIS — N3021 Other chronic cystitis with hematuria: Secondary | ICD-10-CM | POA: Diagnosis not present

## 2021-08-17 DIAGNOSIS — R3914 Feeling of incomplete bladder emptying: Secondary | ICD-10-CM | POA: Diagnosis not present

## 2021-08-17 DIAGNOSIS — M6281 Muscle weakness (generalized): Secondary | ICD-10-CM | POA: Diagnosis not present

## 2021-08-17 NOTE — Telephone Encounter (Signed)
Clinical info completed on *FMLA  form.  Place form in PCP's box for completion.  Erron Wengert, CMA   

## 2021-08-18 ENCOUNTER — Telehealth: Payer: Self-pay | Admitting: Orthopedic Surgery

## 2021-08-18 NOTE — Telephone Encounter (Signed)
Rescheduled 08/19/21

## 2021-08-18 NOTE — Telephone Encounter (Signed)
Pt's daughter calling on behalf of pt. Pt was referred to wound clinic but will not be able to be seen until late January and daughter is worried that will be too long of a wait. Daughter did not know if they can get her worked in somewhere sooner or if she needs another appt with Dr. Tempie Donning just to check the wound out until she can be seen at the wound clinic. The best call back number is [phone].

## 2021-08-18 NOTE — Telephone Encounter (Signed)
I Called and spoke with Hayley Fisher, I advised them to continue with the dressing changes that were discussed in the office until they heard back from [place]. Hayley Fisher states that she understands this and they will still go ahead make appointment at the wound care center for now.   Dr. Tempie Donning can you please address further?

## 2021-08-19 ENCOUNTER — Ambulatory Visit: Payer: Medicare Other

## 2021-08-19 NOTE — Patient Instructions (Signed)
Visit Information  Ms. Zalar  it was nice speaking with you. Please call me directly [phone] if you have questions about the goals we discussed.   Patient Goals/Self Care Activities: -Patient/Caregiver will self-administer medications as prescribed as evidenced by self-report/primary caregiver report ,  -Patient/Caregiver will attend all scheduled provider appointments as evidenced by clinician review of documented attendance to scheduled appointments and patient/caregiver report, -Patient/Caregiver will call pharmacy for medication refills as evidenced by patient report and review of pharmacy fill history as appropriate,  -Patient/Caregiver will call provider office for new concerns or questions as evidenced by review of documented incoming telephone call notes and patient report,  -Patient/Caregiver verbalizes understanding of plan,  -Patient/Caregiver will focus on medication adherence by taking medications as prescribed -check blood sugar at prescribed times -check blood sugar if I feel it is too high or too low    The patient verbalized understanding of instructions, educational materials, and care plan provided today and declined offer to receive copy of patient instructions, educational materials, and care plan.   Follow up Plan: Patient would like continued follow-up.  CCM RNCM will outreach the patient within the next 4 weeks.  Patient will call office if needed prior to next encounter  Lazaro Arms, RN  [phone]

## 2021-08-19 NOTE — Chronic Care Management (AMB) (Signed)
Chronic Care Management   CCM RN Visit Note  08/19/2021 Name: Hayley Fisher MRN: [id number] DOB: [birth year]  Subjective: Hayley Fisher is a 87 y.o. year old female who is a primary care patient of [name], MD. The care management team was consulted for assistance with disease management and care coordination needs.    Engaged with patient by telephone for follow up visit in response to provider referral for case management and/or care coordination services.   Consent to Services:  The patient was given information about Chronic Care Management services, agreed to services, and gave verbal consent prior to initiation of services.  Please see initial visit note for detailed documentation.   Patient agreed to services and verbal consent obtained.    Assessment: Patient's daughter provided all information during this encounter. The patient's blood sugars have been running in the 200's.  Hayley Fisher said that is usually where they stay or below. . See Care Plan below for interventions and patient self-care actives. Follow up Plan: Patient would like continued follow-up.  CCM RNCM will outreach the patient within the next 4 weeks.  Patient will call office if needed prior to next encounter  Review of patient past medical history, allergies, medications, health status, including review of consultants reports, laboratory and other test data, was performed as part of comprehensive evaluation and provision of chronic care management services.   SDOH (Social Determinants of Health) assessments and interventions performed:    CCM Care Plan  Allergies  Allergen Reactions   Tramadol Itching    Takes occasionally with benadryl   Levemir [Insulin Detemir] Itching    Outpatient Encounter Medications as of 08/19/2021  Medication Sig   Accu-Chek Softclix Lancets lancets 1 each by Other route See admin instructions. USE TO TEST BLOOD SUGAR UP  TO 3 TIMES A DAY.   aspirin 81 MG chewable tablet  Chew 81 mg by mouth every morning.   atorvastatin (LIPITOR) 40 MG tablet TAKE 1 TABLET BY MOUTH EVERY DAY (Patient taking differently: Take 40 mg by mouth daily.)   brimonidine (ALPHAGAN) 0.2 % ophthalmic solution Place 1 drop into the left eye 2 (two) times daily.   carbamide peroxide (DEBROX) 6.5 % OTIC solution Place 2 drops into both ears 2 (two) times daily as needed (ear wax).   Catheter Self-Adhesive Urinary MISC 1 patch by Does not apply route daily.   cetirizine (ZYRTEC) 10 MG tablet Take 10 mg by mouth daily as needed for allergies.   clotrimazole-betamethasone (LOTRISONE) cream Apply 1 application topically 2 (two) times daily as needed (rash/itching).   dorzolamide-timolol (COSOPT) 22.3-6.8 MG/ML ophthalmic solution Place 1 drop into both eyes 2 (two) times daily.   DULoxetine (CYMBALTA) 60 MG capsule Take 1 capsule (60 mg total) by mouth daily.   enalapril (VASOTEC) 20 MG tablet Take 1 tablet (20 mg total) by mouth daily.   famotidine (PEPCID) 20 MG tablet Take 1 tablet (20 mg total) by mouth daily.   ferrous sulfate 325 (65 FE) MG tablet Take 325 mg by mouth daily with breakfast.   glucose blood (ACCU-CHEK AVIVA PLUS) test strip 1 each by Other route See admin instructions. USE TO TEST BLOOD SUGAR UP  TO 3 TIMES A DAY.   hydrocortisone (ANUSOL-HC) 2.5 % rectal cream Place 1 application rectally 2 (two) times daily as needed for hemorrhoids or anal itching. Please use twice daily x5 days, and then twice daily as needed.   insulin glargine (LANTUS) 100 unit/mL SOPN Inject 10 Units into the skin  daily. (Patient taking differently: Inject 10 Units into the skin daily before breakfast.)   Insulin Pen Needle 32G X 4 MM MISC Use as directed with insulin   Insulin Syringe-Needle U-100 (SURE COMFORT INSULIN SYRINGE) 31G X 5/16" 0.3 ML MISC Inject 1 Syringe into the skin daily.   ketoconazole (NIZORAL) 2 % cream Apply 1 application topically daily as needed for irritation.   Latanoprostene  Bunod (VYZULTA) 0.024 % SOLN Place 1 drop into both eyes at bedtime.   levothyroxine (SYNTHROID) 50 MCG tablet Take 1 tablet (50 mcg total) by mouth daily.   metFORMIN (GLUCOPHAGE-XR) 500 MG 24 hr tablet Take 2 tablets (1,000 mg total) by mouth daily with breakfast.   Misc. Devices (TRANSFER BOARD) MISC Use as needed for transfer from bed to chair or vice versa   Multiple Vitamins-Minerals (PRESERVISION AREDS 2+MULTI VIT) CAPS Take 1 capsule by mouth daily.   MYRBETRIQ 25 MG TB24 tablet TAKE 1 TABLET BY MOUTH DAILY (Patient taking differently: Take 25 mg by mouth every morning.)   ondansetron (ZOFRAN ODT) 4 MG disintegrating tablet Take 1 tablet (4 mg total) by mouth every 8 (eight) hours as needed for nausea.   polyethylene glycol powder (GLYCOLAX/MIRALAX) 17 GM/SCOOP powder Take 17 g by mouth in the morning and at bedtime.   SARNA lotion Apply 1 application topically 4 (four) times daily as needed for itching.   Semaglutide (RYBELSUS) 3 MG TABS Take 3 mg by mouth daily.   senna (SENOKOT) 8.6 MG TABS tablet Take 1 tablet (8.6 mg total) by mouth 2 (two) times daily.   tamsulosin (FLOMAX) 0.4 MG CAPS capsule Take 0.4 mg by mouth every morning.   witch hazel-glycerin (TUCKS) pad Apply 1 application topically 2 (two) times daily as needed for itching.   No facility-administered encounter medications on file as of 08/19/2021.    Patient Active Problem List   Diagnosis Date Noted   Open wound of right index finger 08/12/2021   Syncope 08/08/2021   Incontinence 07/14/2021   Pressure injury of right buttock, stage 2 ([place]) 07/14/2021   COVID-19 virus infection 06/27/2021   Normocytic anemia 06/27/2021   Leukocytopenia 06/27/2021   Asymptomatic bacteriuria 06/27/2021   Malnutrition of moderate degree 02/23/2021   AMS (altered mental status) 02/21/2021   Cognitive impairment 26/71/2458   Acute metabolic encephalopathy 09/98/3382   Sepsis ([place]) 01/15/2021   Acute encephalopathy 12/09/2020    Hyperbilirubinemia 12/09/2020   Hypothyroidism 12/09/2020   Memory changes 10/20/2020   Acute on chronic anemia 09/21/2020   Gastroesophageal reflux disease without esophagitis 09/16/2020   Urinary frequency 09/11/2020   Stage 1 decubitus ulcer 07/30/2020   Uncontrolled type 2 diabetes mellitus with hyperglycemia ([place]) 07/02/2020   Unstable gait 04/03/2018   Frequent falls 04/03/2018   Age-related physical debility 03/22/2018   Hematuria 01/29/2018   Skin ulcer of sacrum, limited to breakdown of skin ([place]) 03/16/2017   Spinal stenosis of lumbar region 01/13/2016   Chronic venous insufficiency 11/09/2015   DDD (degenerative disc disease), lumbosacral 11/24/2014   Constipation by delayed colonic transit 11/24/2014   Urinary tract infection without hematuria 07/03/2014   Frequent UTI 10/29/2012   Pruritus 03/17/2011   Type 2 diabetes mellitus with diabetic neuropathy ([place]) 10/19/2006   Hyperlipidemia associated with type 2 diabetes mellitus ([place]) 10/19/2006   Hypertension associated with diabetes ([place]) 10/19/2006   Osteopenia 10/19/2006    Conditions to be addressed/monitored:DMII  Care Plan : Diabetes Type 2 (Adult)  Updates made by Lazaro Arms, RN since 08/19/2021 12:00  AM     Problem: Glycemic Management (Diabetes, Type 2)      Long-Range Goal: Glycemic Management Optimized   Start Date: 03/10/2021  Expected End Date: 10/19/2021  Recent Progress: On track  Priority: High  Note:   urrent Barriers:  Knowledge Deficits related to plan of care for management of DMII  Chronic Disease Management support and education needs related to DMII  RNCM Clinical Goal(s):  Patient/Daughter will verbalize understanding of plan for management of DMII through collaboration with RN Care manager, provider, and care team.   Interventions: 1:1 collaboration with primary care provider regarding development and update of comprehensive plan of care as evidenced by provider attestation and  co-signature Inter-disciplinary care team collaboration (see longitudinal plan of care)   Diabetes:  (Status: Goal on track: YES.) Lab Results  Component Value Date   HGBA1C 8.0 (A) 06/09/2021  Assessed patient's/daughter understanding of A1c goal:  patient will remain at current level. Reviewed medications with patient and discussed importance of medication adherence; Discussed plans with patient for ongoing care management follow up and provided patient with direct contact information for care management team; Provided patient/daughter with verbal information regarding hypo and hyperglycemia and importance of correct treatment; Advised patient, providing education and rationale, to check CBG and record, calling the office for findings outside established parameters; 07/2921: I spoke with Hayley Fisher today, and she stated that her mother's blood sugars have been running around 200. She is doing well. She did have some leg swelling because she usually keeps her legs elevated when sitting, but her sister does not make her elevate her legs while sitting. Mrs. Cavey has another UTI. Hayley Fisher stated that she is taking an antibiotic and usually needs to have a Diflucan for yeast and asked me to send a message to her physician. She also asked for a prescription for Miralax and about the referral for home health PT and OT.  I sent a message to the Referral coordinator to look at when she returns from her vacation.   Patient Goals/Self Care Activities: -Patient/Caregiver will self-administer medications as prescribed as evidenced by self-report/primary caregiver report ,  -Patient/Caregiver will attend all scheduled provider appointments as evidenced by clinician review of documented attendance to scheduled appointments and patient/caregiver report, -Patient/Caregiver will call pharmacy for medication refills as evidenced by patient report and review of pharmacy fill history as appropriate,  -Patient/Caregiver  will call provider office for new concerns or questions as evidenced by review of documented incoming telephone call notes and patient report,  -Patient/Caregiver verbalizes understanding of plan,  -Patient/Caregiver will focus on medication adherence by taking medications as prescribed -check blood sugar at prescribed times -check blood sugar if I feel it is too high or too low         Lazaro Arms RN, BSN, [place] Phone: [phone] I Fax: [phone]

## 2021-08-20 ENCOUNTER — Other Ambulatory Visit: Payer: Self-pay | Admitting: Student

## 2021-08-20 DIAGNOSIS — N390 Urinary tract infection, site not specified: Secondary | ICD-10-CM

## 2021-08-20 DIAGNOSIS — K5901 Slow transit constipation: Secondary | ICD-10-CM

## 2021-08-20 MED ORDER — POLYETHYLENE GLYCOL 3350 17 GM/SCOOP PO POWD: 17.0000 g | Freq: Two times a day (BID) | ORAL | 3 refills | Status: DC

## 2021-08-20 MED ORDER — FLUCONAZOLE 150 MG PO TABS: 150.0000 mg | ORAL_TABLET | Freq: Once | ORAL | 0 refills | Status: AC

## 2021-08-20 NOTE — Progress Notes (Signed)
Called and spoke with daughter Hayley Fisher. Refilled miralax and prescribed diflucan 150 mg pill x 1 as Hayley Fisher usually gets yeast infections with antibiotic course for UTI. Spoke about FMLA paperwork as well and completed this for daughter as she is a caretaker for Hayley Fisher and placed in [employer] triage box.

## 2021-08-24 ENCOUNTER — Other Ambulatory Visit: Payer: Commercial Managed Care - HMO | Admitting: Hospice

## 2021-08-24 ENCOUNTER — Other Ambulatory Visit: Payer: Self-pay

## 2021-08-24 ENCOUNTER — Telehealth: Payer: Self-pay | Admitting: *Deleted

## 2021-08-24 ENCOUNTER — Other Ambulatory Visit: Payer: Self-pay | Admitting: Student

## 2021-08-24 DIAGNOSIS — R296 Repeated falls: Secondary | ICD-10-CM

## 2021-08-24 DIAGNOSIS — Z794 Long term (current) use of insulin: Secondary | ICD-10-CM | POA: Diagnosis not present

## 2021-08-24 DIAGNOSIS — R2681 Unsteadiness on feet: Secondary | ICD-10-CM

## 2021-08-24 DIAGNOSIS — Z515 Encounter for palliative care: Secondary | ICD-10-CM

## 2021-08-24 DIAGNOSIS — R531 Weakness: Secondary | ICD-10-CM

## 2021-08-24 DIAGNOSIS — E1140 Type 2 diabetes mellitus with diabetic neuropathy, unspecified: Secondary | ICD-10-CM

## 2021-08-24 DIAGNOSIS — K5901 Slow transit constipation: Secondary | ICD-10-CM

## 2021-08-24 NOTE — Telephone Encounter (Signed)
-----   Message from Lazaro Arms, RN sent at 08/19/2021  4:41 PM EST ----- Regarding: HOme health PT and OT Arelia Volpe  I had a call with Jakie Ranson and she is asking when will they here something from home health for PT and OT for  her mother.  She said the Referral was placed back in November.  Can you give her a call please.  Thanks  [employer]

## 2021-08-24 NOTE — Telephone Encounter (Signed)
Patient's daughter called and informed that forms are ready for pick up. Copy made and placed in batch scanning. Original placed at front desk for pick up.   Vivaan Helseth C Akasia Ahmad, RN  

## 2021-08-24 NOTE — Telephone Encounter (Signed)
Levinia with [facility] called back this afternoon to clarify what needs to be done for the patient.  The referral for PT/OT was a recommendation by her and this will need to be ordered under [employer] in order for me to review and process it.  Will forward to MD and ask her to place this referral under [facility] context.  Chrystal Zeimet,CMA

## 2021-08-24 NOTE — Telephone Encounter (Signed)
Spoke with daughter [name] and let her know that [facility] put this referral in for the patient.  She will need to check with her care team there and see what the status is of the referral.  It doesn't drop into my WQ when entered by or through another office, so it wouldn't be processed by [place].  She voiced understanding and will call them. Shandell Giovanni,CMA

## 2021-08-24 NOTE — Progress Notes (Signed)
[place] Consult Note Telephone: [phone]  Fax: [phone]  PATIENT NAME: Hayley Fisher DOB: [birth year] MRN: [id number]  PRIMARY CARE PROVIDER:   [name], MD [name], [place],  [place] [address]  REFERRING PROVIDER: [name], MD [name], [place],  [place] [address]  RESPONSIBLE PARTY:   Joni Reining - grand daughter Contact Information     Name Relation Home Work [place] Daughter [phone]  [phone]   Cornelius,LaLeisa Daughter   [phone]   Elba Barman [phone]        TELEHEALTH VISIT STATEMENT Due to the COVID-19 crisis, this visit was done via telemedicine from my office and it was initiated and consent by this patient and or family. Video-audio (telehealth) contact was unable to be done due to technical barriers from the patients side. I connected with patient OR PROXY by a telephone  and verified that I am speaking with the correct person. I discussed the limitations of evaluation and management by telemedicine. The patient expressed understanding and agreed to proceed.  Visit is to build trust and highlight Palliative Medicine as specialized medical care for people living with serious illness, aimed at facilitating better quality of life through symptoms relief, assisting with advance care planning and complex medical decision making. [name] is home with patient during visit. [name] joined visit via conference call. This is a follow up visit.  RECOMMENDATIONS/PLAN:   Advance Care Planning/Code Status: CODE STATUS: Ramifications and implications of code status discussed. Patient affirmed she is a Partial code - wants chest compressions, ACLS meds, no mechanical ventilation.   Goals of Care: Goals of care include to maximize quality of life and symptom management. MOST form was  earlier given to [name] to further  explore goals of care.  Patient was a hospice patient and discharged from service.   Visit consisted of counseling and education dealing with the complex and emotionally intense issues of symptom management and palliative care in the setting of serious and potentially life-threatening illness. Palliative care team will continue to support patient, patient's family, and medical team.  Symptom Management/Plan: Weakness: Ongoing weakness impairing mobility, worsened by recent hospitalization for  impaction, UTI, COVID. [name] reports patient not started on therapy yet.  Recommendation: PT/OT recommended for strengthening and gait training. NP sent another secured chat to PCP and will fax this recommendation to her office. Also spoke with Jarrett Soho and then [place] who took the message and said it would be taken care of.  Type 2 DM with Neuropathy: Continue Metformin, Levirmir. A1c 8.0 06/10/2021. Education reiterated on diet and exercise.  No concentrated sweets. CBG ACHS. Recheck A1c every 3 months to 6 months to reevaluate treatment.  Constipation: Managed with Miralax, Senna S and Colace.   Follow up: Palliative care will continue to follow for complex medical decision making, advance care planning, and clarification of goals. Return 6 weeks or prn.Encouraged to call provider sooner with any concerns.    Family /Caregiver/Community Supports: Patient lives at home  with her sister and children. Strong family support system identified.    HOSPICE ELIGIBILITY/DIAGNOSIS: TBD   Chief Complaint: Initial Palliative care visit   HISTORY OF PRESENT ILLNESS:  Hayley Fisher is a 88 y.o. year old female  with multiple medical conditions including worsening weakness, worsened with recent hospitalizations, last one was Dec 2023 for impaction. Weakness is continuous, impairing patient's ADLs, requiring more help from family members than before.  Recent hospitalization has worsened her weakness. Patient sits mostly  in her wheelchair not able to walk around as before due to progressing weakness. She said the last time she had physical therapy it was helpful. Patient with history of COVID-19 viral infection, recurrent UTI, DVT, Depression, Urinary retention for which she has a Foley in place since June 2022, followed by a Dealer. History obtained from review of EMR, discussion with primary team, family and/or patient. Records reviewed and summarized above. All 10 point systems reviewed and are negative except as documented in history of present illness above  Review and summarization of Epic records shows history from other than patient.   Palliative Care was asked to follow this patient o help address complex decision making in the context of advance care planning and goals of care clarification. I reviewed, as needed, available labs, patient records, imaging, studies and related documents from the EMR.     PERTINENT MEDICATIONS:  Outpatient Encounter Medications as of 08/24/2021  Medication Sig   Accu-Chek Softclix Lancets lancets 1 each by Other route See admin instructions. USE TO TEST BLOOD SUGAR UP  TO 3 TIMES A DAY.   aspirin 81 MG chewable tablet Chew 81 mg by mouth every morning.   atorvastatin (LIPITOR) 40 MG tablet TAKE 1 TABLET BY MOUTH EVERY DAY (Patient taking differently: Take 40 mg by mouth daily.)   brimonidine (ALPHAGAN) 0.2 % ophthalmic solution Place 1 drop into the left eye 2 (two) times daily.   carbamide peroxide (DEBROX) 6.5 % OTIC solution Place 2 drops into both ears 2 (two) times daily as needed (ear wax).   Catheter Self-Adhesive Urinary MISC 1 patch by Does not apply route daily.   cetirizine (ZYRTEC) 10 MG tablet Take 10 mg by mouth daily as needed for allergies.   clotrimazole-betamethasone (LOTRISONE) cream Apply 1 application topically 2 (two) times daily as needed (rash/itching).   dorzolamide-timolol (COSOPT) 22.3-6.8 MG/ML ophthalmic solution Place 1 drop into both eyes 2  (two) times daily.   DULoxetine (CYMBALTA) 60 MG capsule Take 1 capsule (60 mg total) by mouth daily.   enalapril (VASOTEC) 20 MG tablet Take 1 tablet (20 mg total) by mouth daily.   famotidine (PEPCID) 20 MG tablet Take 1 tablet (20 mg total) by mouth daily.   ferrous sulfate 325 (65 FE) MG tablet Take 325 mg by mouth daily with breakfast.   glucose blood (ACCU-CHEK AVIVA PLUS) test strip 1 each by Other route See admin instructions. USE TO TEST BLOOD SUGAR UP  TO 3 TIMES A DAY.   hydrocortisone (ANUSOL-HC) 2.5 % rectal cream Place 1 application rectally 2 (two) times daily as needed for hemorrhoids or anal itching. Please use twice daily x5 days, and then twice daily as needed.   insulin glargine (LANTUS) 100 unit/mL SOPN Inject 10 Units into the skin daily. (Patient taking differently: Inject 10 Units into the skin daily before breakfast.)   Insulin Pen Needle 32G X 4 MM MISC Use as directed with insulin   Insulin Syringe-Needle U-100 (SURE COMFORT INSULIN SYRINGE) 31G X 5/16" 0.3 ML MISC Inject 1 Syringe into the skin daily.   ketoconazole (NIZORAL) 2 % cream Apply 1 application topically daily as needed for irritation.   Latanoprostene Bunod (VYZULTA) 0.024 % SOLN Place 1 drop into both eyes at bedtime.   levothyroxine (SYNTHROID) 50 MCG tablet Take 1 tablet (50 mcg total) by mouth daily.   metFORMIN (GLUCOPHAGE-XR) 500 MG 24 hr tablet Take 2 tablets (1,000 mg total) by  mouth daily with breakfast.   Misc. Devices (TRANSFER BOARD) MISC Use as needed for transfer from bed to chair or vice versa   Multiple Vitamins-Minerals (PRESERVISION AREDS 2+MULTI VIT) CAPS Take 1 capsule by mouth daily.   MYRBETRIQ 25 MG TB24 tablet TAKE 1 TABLET BY MOUTH DAILY (Patient taking differently: Take 25 mg by mouth every morning.)   ondansetron (ZOFRAN ODT) 4 MG disintegrating tablet Take 1 tablet (4 mg total) by mouth every 8 (eight) hours as needed for nausea.   polyethylene glycol powder (GLYCOLAX/MIRALAX) 17  GM/SCOOP powder Take 17 g by mouth in the morning and at bedtime.   SARNA lotion Apply 1 application topically 4 (four) times daily as needed for itching.   Semaglutide (RYBELSUS) 3 MG TABS Take 3 mg by mouth daily.   senna (SENOKOT) 8.6 MG TABS tablet Take 1 tablet (8.6 mg total) by mouth 2 (two) times daily.   tamsulosin (FLOMAX) 0.4 MG CAPS capsule Take 0.4 mg by mouth every morning.   witch hazel-glycerin (TUCKS) pad Apply 1 application topically 2 (two) times daily as needed for itching.   No facility-administered encounter medications on file as of 08/24/2021.    HOSPICE ELIGIBILITY/DIAGNOSIS: TBD  PAST MEDICAL HISTORY:  Past Medical History:  Diagnosis Date   Arthritis    Arthritis    Cataract    bil cateracts removed   Chronic kidney disease    "spot on one of my kidneys" per pt   Diabetes mellitus    Glaucoma    Hyperkalemia 07/02/2020   Hyperlipidemia    Hyperplastic colon polyp    Hypertension    Hypothyroidism 0/96/0454   Metabolic acidosis 09/81/1914   Syncope    Thyroid disease       ALLERGIES:  Allergies  Allergen Reactions   Tramadol Itching    Takes occasionally with benadryl   Levemir [Insulin Detemir] Itching     I spent 45 minutes providing this consultation; this includes time spent with patient/family, chart review and documentation. More than 50% of the time in this consultation was spent on counseling and coordinating communication   Thank you for the opportunity to participate in the care of Elowen Debruyn Please call our office at [phone] if we can be of additional assistance.  Note: Portions of this note were generated with L[employer]. Dictation errors may occur despite best attempts at proofreading.  Teodoro Spray, NP

## 2021-08-25 ENCOUNTER — Telehealth: Payer: Self-pay

## 2021-08-25 ENCOUNTER — Telehealth: Payer: Self-pay | Admitting: Student

## 2021-08-25 NOTE — Telephone Encounter (Signed)
Patient dropped off form at front desk for [facility].  Verified that patient section of form has been completed.  Last DOS/WCC with PCP was 08/13/21.  Placed form in blue team folder to be completed by clinical staff.  Creig Hines

## 2021-08-25 NOTE — Telephone Encounter (Signed)
Daughter calls nurse line reporting she picked up FMLA papers yesterday, however the need to be corrected. Daughter reports she is going to leave some notes for PCP to review and redo the FMLA forms.   Will await form drop off.

## 2021-08-25 NOTE — Addendum Note (Signed)
Addended by: [name] on: 08/25/2021 01:51 PM   Modules accepted: Orders

## 2021-08-26 DIAGNOSIS — R739 Hyperglycemia, unspecified: Secondary | ICD-10-CM | POA: Diagnosis not present

## 2021-08-26 DIAGNOSIS — I499 Cardiac arrhythmia, unspecified: Secondary | ICD-10-CM | POA: Diagnosis not present

## 2021-08-26 DIAGNOSIS — Z743 Need for continuous supervision: Secondary | ICD-10-CM | POA: Diagnosis not present

## 2021-08-27 NOTE — Telephone Encounter (Signed)
Patient's daughter called and informed that paperwork is ready for pick up. Copy made and placed in batch scanning.   Original at front desk for pick up.   Talbot Grumbling, RN

## 2021-08-31 ENCOUNTER — Ambulatory Visit (INDEPENDENT_AMBULATORY_CARE_PROVIDER_SITE_OTHER): Payer: Medicare Other | Admitting: Family Medicine

## 2021-08-31 ENCOUNTER — Other Ambulatory Visit: Payer: Self-pay

## 2021-08-31 DIAGNOSIS — F5101 Primary insomnia: Secondary | ICD-10-CM

## 2021-08-31 DIAGNOSIS — K5900 Constipation, unspecified: Secondary | ICD-10-CM | POA: Insufficient documentation

## 2021-08-31 DIAGNOSIS — G4700 Insomnia, unspecified: Secondary | ICD-10-CM | POA: Insufficient documentation

## 2021-08-31 NOTE — Patient Instructions (Signed)
It was great seeing you today!  Today we discussed your hospital follow up. I am glad that you are doing well, continue to use miralax daily and adjust as needed based on your bowel movements. The goal is to have 1-2 soft stools daily. Make sure to also incorporate high fiber foods into your diet.   Please maintain a sleep journal to keep track of how much sleep you are getting. You may try melatonin to see if this helps and make sure to have a regular bedtime routine. Please avoid screen time at least 20 minutes before bedtime.   Please follow up at your next scheduled appointment in 1 month, if anything arises between now and then, please don't hesitate to contact our office.   Thank you for allowing [place] to be a part of your medical care!  Thank you, Dr. Larae Grooms

## 2021-08-31 NOTE — Assessment & Plan Note (Signed)
-  resolved, continue miralax and encouraged high fiber diet

## 2021-08-31 NOTE — Assessment & Plan Note (Addendum)
-  sleep hygiene extensively discussed, instructed to maintain sleep diary,  mood seems appropriate. Also considered sleep apnea but no indication for sleep study at this time -encouraged melatonin OTC -follow up with PCP at earliest convenience

## 2021-08-31 NOTE — Progress Notes (Signed)
° ° °  SUBJECTIVE:   CHIEF COMPLAINT / HPI:   Patient presents after a hospital follow up, she is accompanied by her daughter. Recently had a short hospital stay regarding near syncopal episode, endorses constipation. Believes to be due to having anxiety due to not having a bowel movement. CT abdomen noted to have evidence of a large colonic stool burden without evidence of obstruction. She was given an enema which had very positive results. Also on miralax at home and is doing well per daughter. Since discharge home, has had about 1-2 bowel movements a day.    Also endorses trouble staying asleep that has been occurring over the past few weeks. Takes naps during the day, no observable snoring. Denies any dyspnea but gets about 4 hours of sleep nightly. States that she still ends up feeling well rested when she gets up. Denies any ongoing stressors, daughter shares that patient prays a lot and keeps in close contact with the minister.   OBJECTIVE:   BP (!) 159/103    Ht 5\' 2"  (1.575 m)    BMI 23.78 kg/m   General: Patient well-appearing, in no acute distress. CV: RRR, no murmurs or gallops auscultated  Resp: CTAB, no wheezing, rales or rhonchi noted Abdomen: soft, nontender, nondistended, no rebound tenderness or guarding, presence of bowel sounds  Ext: radial pulses strong and equal bilaterally, no LE edema noted bilaterally Neuro: ambulates with assistance of wheelchair Psych: mood appropriate   ASSESSMENT/PLAN:   Constipation -resolved, continue miralax and encouraged high fiber diet  Insomnia -sleep hygiene extensively discussed, instructed to maintain sleep diary,  mood seems appropriate. Also considered sleep apnea but no indication for sleep study at this time -encouraged melatonin OTC -follow up with PCP at earliest [place]

## 2021-09-01 DIAGNOSIS — E113313 Type 2 diabetes mellitus with moderate nonproliferative diabetic retinopathy with macular edema, bilateral: Secondary | ICD-10-CM | POA: Diagnosis not present

## 2021-09-01 DIAGNOSIS — H401112 Primary open-angle glaucoma, right eye, moderate stage: Secondary | ICD-10-CM | POA: Diagnosis not present

## 2021-09-07 ENCOUNTER — Other Ambulatory Visit: Payer: Self-pay

## 2021-09-08 ENCOUNTER — Ambulatory Visit: Payer: Medicare Other | Admitting: Endocrinology

## 2021-09-09 ENCOUNTER — Other Ambulatory Visit: Payer: Self-pay | Admitting: Endocrinology

## 2021-09-09 ENCOUNTER — Encounter: Payer: Self-pay | Admitting: Student

## 2021-09-09 ENCOUNTER — Other Ambulatory Visit: Payer: Self-pay

## 2021-09-09 ENCOUNTER — Ambulatory Visit (INDEPENDENT_AMBULATORY_CARE_PROVIDER_SITE_OTHER): Payer: Medicare Other | Admitting: Student

## 2021-09-09 VITALS — BP 160/80 | HR 98 | Ht 62.0 in

## 2021-09-09 DIAGNOSIS — Z7189 Other specified counseling: Secondary | ICD-10-CM | POA: Insufficient documentation

## 2021-09-09 DIAGNOSIS — F5101 Primary insomnia: Secondary | ICD-10-CM | POA: Diagnosis not present

## 2021-09-09 DIAGNOSIS — N390 Urinary tract infection, site not specified: Secondary | ICD-10-CM | POA: Diagnosis not present

## 2021-09-09 DIAGNOSIS — E1140 Type 2 diabetes mellitus with diabetic neuropathy, unspecified: Secondary | ICD-10-CM

## 2021-09-09 DIAGNOSIS — L98421 Non-pressure chronic ulcer of back limited to breakdown of skin: Secondary | ICD-10-CM | POA: Diagnosis not present

## 2021-09-09 DIAGNOSIS — R110 Nausea: Secondary | ICD-10-CM | POA: Diagnosis not present

## 2021-09-09 DIAGNOSIS — R350 Frequency of micturition: Secondary | ICD-10-CM

## 2021-09-09 DIAGNOSIS — Z794 Long term (current) use of insulin: Secondary | ICD-10-CM

## 2021-09-09 DIAGNOSIS — F419 Anxiety disorder, unspecified: Secondary | ICD-10-CM | POA: Diagnosis not present

## 2021-09-09 DIAGNOSIS — F39 Unspecified mood [affective] disorder: Secondary | ICD-10-CM | POA: Insufficient documentation

## 2021-09-09 HISTORY — DX: Other specified counseling: Z71.89

## 2021-09-09 LAB — POCT GLYCOSYLATED HEMOGLOBIN (HGB A1C): HbA1c, POC (controlled diabetic range): 7.6 % — AB (ref 0.0–7.0)

## 2021-09-09 MED ORDER — BUSPIRONE HCL 5 MG PO TABS: 5.0000 mg | ORAL_TABLET | Freq: Three times a day (TID) | ORAL | 1 refills | Status: DC

## 2021-09-09 MED ORDER — ONDANSETRON 4 MG PO TBDP: 4.0000 mg | ORAL_TABLET | Freq: Three times a day (TID) | ORAL | 0 refills | Status: DC | PRN

## 2021-09-09 MED ORDER — MIRABEGRON ER 25 MG PO TB24: 25.0000 mg | ORAL_TABLET | Freq: Every day | ORAL | 1 refills | Status: DC

## 2021-09-09 NOTE — Progress Notes (Signed)
SUBJECTIVE:   CHIEF COMPLAINT / HPI:   Chronic Foley   Urinary Incontinence Last urology follow up was last month per patient.  Says that there is more cloudiness in her urinary catheter container.  Denies any abdominal pain, denies any systemic symptoms such as fever.  Does follow with urology for routine catheter changes.  Pressure ulcer  Patient says that she has a pressure ulcer on her sacrum that opens and closes.  Her granddaughter attest that likely due to patient being in wheelchair and laying down in bed.  Switched from her hospital bed to memory foam mattress.  Denies any fevers currently.  Insomnia Patient says that she has been having worsening insomnia.  Says she goes to bed around 9 PM but has trouble falling asleep.  Daughter had asked about potential for trazodone.  Given patient has not full comfort care advised that it would be likely very sedating and could increase risk of fall.  Has been trying some melatonin without much help.  Has been practicing good sleep hygiene with turning off television and being in a dark room.  Has been having anxiety as well that has been increasing which could be contributing to the insomnia.  Anxiety Patient says that she has been having increased anxiety and verbal like an increase in her anxiety medication Cymbalta or to try something else.  T2DM Followed by [facility] endocrinology. Last A1c 8% 3 months ago 06/09/21 and endocrinology advised stopping humalog, metformin was transitioned to XR, semaglutide was continued. Scheduled to see them sometime this month.  Says that she gets A1c's here and shows them to her endocrinologist, A1c today 7.6, patient has not been having any lows.  Goals of care Discussed with daughters and patient what goals of care currently are given patient is following with outpatient palliative.  Said they want to continue medications such as atorvastatin and insulin at this time and are not interested in comfort  care measures currently.  Said that they had requested home health PT/OT but have not heard back from them after I have referred twice.    PERTINENT  PMH / PSH: T2DM (followed by endocrinology), HTN, HLD  OBJECTIVE:   BP (!) 160/80    Pulse 98    Ht 5\' 2"  (1.575 m)    SpO2 99%    BMI 23.78 kg/m   General: Nontoxic, awake, alert, responsive to questions Head: Normocephalic atraumatic CV: Regular rate and rhythm no murmurs rubs or gallops Respiratory: Clear to ausculation bilaterally, no wheezes rales or crackles,  no increased work of breathing Abdomen: Soft, non-tender, non-distended, normoactive bowel sounds  Extremities: Moves upper and lower extremities freely, trace nonpitting edema in LE Neuro: No focal deficits Skin: 1 cm sacral ulcer visualized on examination, no purulent discharge, not infected appearing no skin breakdown around ulcer, bandage was in place without drainage Chaperone CMA Sharyn Lull present for examination  ASSESSMENT/PLAN:   Frequent UTI Patient had cloudy urine in catheter container today.  Does have chronic foley in due to urinary incontinence.  Follows with urology.  Denies any abdominal pain or fever.  Does not have change in mental status. -Advised patient to follow-up at urology clinic for routine catheter replacements -Monitor for UTI symptoms -Refilled Myrbetriq  Skin ulcer of sacrum, limited to breakdown of skin ([place]) 1 cm ulcer on sacrum noted on examination.  No purulence and not infected appearing.  Dry with bandage on top. -Advise routine bandage changes -Advised barrier -Advised weight shifting -Advised eggshell  pillow in bed -Advise hospital bed usage -Home health nurse could help manage this as well  Insomnia Continues to complain of insomnia.  Has been practicing good sleep hygiene.  Was interested in trazodone but likely too sedating in 88 year old and patient is not currently comfort care. -Melatonin OTC -sleep hygiene  practices -Managing anxiety  Anxiety Patient has been having anxiety that has been increasing.  Currently on Cymbalta.  Daughter was interested in Atarax but can be sedating in elderly individuals as well. -Added BuSpar 3 times daily -Monitor at future visits  Type 2 diabetes mellitus with diabetic neuropathy ([place]) Follows with [facility] endocrinology.  A1c today was 7.6.  -Follow-up with endocrinology this month  Goals of care, counseling/discussion Patient currently following with outpatient palliative.  Not comfort care.  Patient/family want to continue with current medications such as atorvastatin and insulin.  Have not been reached out to by home health PT/OT -I have reached out to our referral specialist about home health referral status    [name], MD [place]

## 2021-09-09 NOTE — Assessment & Plan Note (Signed)
Continues to complain of insomnia.  Has been practicing good sleep hygiene.  Was interested in trazodone but likely too sedating in 88 year old and patient is not currently comfort care. -Melatonin OTC -sleep hygiene practices -Managing anxiety

## 2021-09-09 NOTE — Assessment & Plan Note (Signed)
Patient currently following with outpatient palliative.  Not comfort care.  Patient/family want to continue with current medications such as atorvastatin and insulin.  Have not been reached out to by home health PT/OT -I have reached out to our referral specialist about home health referral status

## 2021-09-09 NOTE — Assessment & Plan Note (Signed)
Follows with [facility] endocrinology.  A1c today was 7.6.  -Follow-up with endocrinology this month

## 2021-09-09 NOTE — Assessment & Plan Note (Signed)
Patient has been having anxiety that has been increasing.  Currently on Cymbalta.  Daughter was interested in Atarax but can be sedating in elderly individuals as well. -Added BuSpar 3 times daily -Monitor at future visits

## 2021-09-09 NOTE — Assessment & Plan Note (Signed)
Patient had cloudy urine in catheter container today.  Does have chronic foley in due to urinary incontinence.  Follows with urology.  Denies any abdominal pain or fever.  Does not have change in mental status. -Advised patient to follow-up at urology clinic for routine catheter replacements -Monitor for UTI symptoms -Refilled Myrbetriq

## 2021-09-09 NOTE — Assessment & Plan Note (Addendum)
1 cm ulcer on sacrum noted on examination.  No purulence and not infected appearing.  Dry with bandage on top. -Advise routine bandage changes -Advised barrier -Advised weight shifting -Advised eggshell pillow in bed -Advise hospital bed usage -Home health nurse could help manage this as well

## 2021-09-09 NOTE — Patient Instructions (Signed)
It was great to see you! Thank you for allowing me to participate in your care!   I recommend that you always bring your medications to each appointment as this makes it easy to ensure we are on the correct medications and helps [place] not miss when refills are needed.  Our plans for today:   -We are getting an A1c today, please follow-up with your endocrinologist for management -Please go to your urologist for catheter changes, you do not currently have any pain on abdomen exam or fevers which is a good sign -I am messaging the referral specialist today about the home health PT/OT -I recommend routine dressing changes for the ulcer and using an eggshell pillow in bed to ease pressure on your bottom -I have sent in buspirone for your anxiety -I have refilled your Myrbetriq and a short course of Zofran (if the nausea is ongoing please let us know and we will follow this up)   Take care and seek immediate care sooner if you develop any concerns. Please remember to show up 15 minutes before your scheduled appointment time!  [name], MD [place]

## 2021-09-10 ENCOUNTER — Other Ambulatory Visit: Payer: Self-pay

## 2021-09-10 ENCOUNTER — Other Ambulatory Visit: Payer: Self-pay | Admitting: Student

## 2021-09-10 DIAGNOSIS — R350 Frequency of micturition: Secondary | ICD-10-CM

## 2021-09-10 DIAGNOSIS — R2681 Unsteadiness on feet: Secondary | ICD-10-CM

## 2021-09-13 ENCOUNTER — Other Ambulatory Visit: Payer: Self-pay | Admitting: Endocrinology

## 2021-09-14 DIAGNOSIS — R3914 Feeling of incomplete bladder emptying: Secondary | ICD-10-CM | POA: Diagnosis not present

## 2021-09-15 DIAGNOSIS — E1121 Type 2 diabetes mellitus with diabetic nephropathy: Secondary | ICD-10-CM | POA: Diagnosis not present

## 2021-09-15 DIAGNOSIS — D72819 Decreased white blood cell count, unspecified: Secondary | ICD-10-CM | POA: Diagnosis not present

## 2021-09-15 DIAGNOSIS — E1140 Type 2 diabetes mellitus with diabetic neuropathy, unspecified: Secondary | ICD-10-CM | POA: Diagnosis not present

## 2021-09-15 DIAGNOSIS — I10 Essential (primary) hypertension: Secondary | ICD-10-CM | POA: Diagnosis not present

## 2021-09-16 ENCOUNTER — Encounter (HOSPITAL_BASED_OUTPATIENT_CLINIC_OR_DEPARTMENT_OTHER): Payer: Medicare Other | Admitting: Internal Medicine

## 2021-09-16 ENCOUNTER — Telehealth: Payer: Self-pay

## 2021-09-16 ENCOUNTER — Ambulatory Visit: Payer: Medicare Other

## 2021-09-16 NOTE — Chronic Care Management (AMB) (Signed)
Chronic Care Management   CCM RN Visit Note  09/16/2021 Name: Hayley Fisher MRN: [id number] DOB: 02/12/1931  Subjective: Hayley Fisher is a 88 y.o. year old female who is a primary care patient of [name], MD. The care management team was consulted for assistance with disease management and care coordination needs.    Engaged with patient by telephone for follow up visit in response to provider referral for case management and/or care coordination services.   Consent to Services:  The patient was given information about Chronic Care Management services, agreed to services, and gave verbal consent prior to initiation of services.  Please see initial visit note for detailed documentation.   Patient agreed to services and verbal consent obtained.    Summary: Patient's daughter provided all information during this encounter. Patient is currently experiencing difficulty with elevated blood sugars.. See Care Plan below for interventions and patient self-care actives.  Recommendation: The patient may benefit from monitoring her food intake ,follow up visit with endocrinologist. The daughter agrees  Follow up Plan: Patient would like continued follow-up.  CCM RNCM will outreach the patient within the next 5 weeks.  Patient will call office if needed prior to next encounter  Assessment: Review of patient past medical history, allergies, medications, health status, including review of consultants reports, laboratory and other test data, was performed as part of comprehensive evaluation and provision of chronic care management services.   SDOH (Social Determinants of Health) assessments and interventions performed:    CCM Care Plan  Allergies  Allergen Reactions   Tramadol Itching    Takes occasionally with benadryl   Levemir [Insulin Detemir] Itching    Outpatient Encounter Medications as of 09/16/2021  Medication Sig   Accu-Chek Softclix Lancets lancets 1 each by Other route  See admin instructions. USE TO TEST BLOOD SUGAR UP  TO 3 TIMES A DAY.   aspirin 81 MG chewable tablet Chew 81 mg by mouth every morning.   atorvastatin (LIPITOR) 40 MG tablet TAKE 1 TABLET BY MOUTH EVERY DAY (Patient taking differently: Take 40 mg by mouth daily.)   brimonidine (ALPHAGAN) 0.2 % ophthalmic solution Place 1 drop into the left eye 2 (two) times daily.   busPIRone (BUSPAR) 5 MG tablet Take 1 tablet (5 mg total) by mouth 3 (three) times daily.   carbamide peroxide (DEBROX) 6.5 % OTIC solution Place 2 drops into both ears 2 (two) times daily as needed (ear wax).   Catheter Self-Adhesive Urinary MISC 1 patch by Does not apply route daily.   cetirizine (ZYRTEC) 10 MG tablet Take 10 mg by mouth daily as needed for allergies.   clotrimazole-betamethasone (LOTRISONE) cream Apply 1 application topically 2 (two) times daily as needed (rash/itching).   dorzolamide-timolol (COSOPT) 22.3-6.8 MG/ML ophthalmic solution Place 1 drop into both eyes 2 (two) times daily.   DULoxetine (CYMBALTA) 60 MG capsule Take 1 capsule (60 mg total) by mouth daily.   enalapril (VASOTEC) 20 MG tablet Take 1 tablet (20 mg total) by mouth daily.   famotidine (PEPCID) 20 MG tablet Take 1 tablet (20 mg total) by mouth daily.   ferrous sulfate 325 (65 FE) MG tablet Take 325 mg by mouth daily with breakfast.   GLOBAL EASE INJECT PEN NEEDLES 32G X 4 MM MISC USE 1 SYRINGE DAILY TO INJECT VICTOZA   glucose blood (ACCU-CHEK AVIVA PLUS) test strip 1 each by Other route See admin instructions. USE TO TEST BLOOD SUGAR UP  TO 3 TIMES A DAY.   hydrocortisone (  ANUSOL-HC) 2.5 % rectal cream Place 1 application rectally 2 (two) times daily as needed for hemorrhoids or anal itching. Please use twice daily x5 days, and then twice daily as needed.   Insulin Syringe-Needle U-100 (SURE COMFORT INSULIN SYRINGE) 31G X 5/16" 0.3 ML MISC Inject 1 Syringe into the skin daily.   ketoconazole (NIZORAL) 2 % cream Apply 1 application topically  daily as needed for irritation.   LANTUS SOLOSTAR 100 UNIT/ML Solostar Pen inject 10 UNITS into THE SKIN daily   Latanoprostene Bunod (VYZULTA) 0.024 % SOLN Place 1 drop into both eyes at bedtime.   levothyroxine (SYNTHROID) 50 MCG tablet Take 1 tablet (50 mcg total) by mouth daily.   metFORMIN (GLUCOPHAGE-XR) 500 MG 24 hr tablet Take 2 tablets (1,000 mg total) by mouth daily with breakfast.   mirabegron ER (MYRBETRIQ) 25 MG TB24 tablet Take 1 tablet (25 mg total) by mouth daily.   Misc. Devices (TRANSFER BOARD) MISC Use as needed for transfer from bed to chair or vice versa   Multiple Vitamins-Minerals (PRESERVISION AREDS 2+MULTI VIT) CAPS Take 1 capsule by mouth daily.   ondansetron (ZOFRAN ODT) 4 MG disintegrating tablet Take 1 tablet (4 mg total) by mouth every 8 (eight) hours as needed for nausea.   polyethylene glycol powder (GLYCOLAX/MIRALAX) 17 GM/SCOOP powder Take 17 g by mouth in the morning and at bedtime.   SARNA lotion Apply 1 application topically 4 (four) times daily as needed for itching.   Semaglutide (RYBELSUS) 3 MG TABS Take 3 mg by mouth daily.   senna (SENOKOT) 8.6 MG TABS tablet Take 1 tablet (8.6 mg total) by mouth 2 (two) times daily.   tamsulosin (FLOMAX) 0.4 MG CAPS capsule Take 0.4 mg by mouth every morning.   witch hazel-glycerin (TUCKS) pad Apply 1 application topically 2 (two) times daily as needed for itching.   No facility-administered encounter medications on file as of 09/16/2021.    Patient Active Problem List   Diagnosis Date Noted   Anxiety 09/09/2021   Goals of care, counseling/discussion 09/09/2021   Constipation 08/31/2021   Insomnia 08/31/2021   Open wound of right index finger 08/12/2021   Syncope 08/08/2021   Incontinence 07/14/2021   Pressure injury of right buttock, stage 2 ([place]) 07/14/2021   COVID-19 virus infection 06/27/2021   Normocytic anemia 06/27/2021   Leukocytopenia 06/27/2021   Asymptomatic bacteriuria 06/27/2021   Malnutrition of  moderate degree 02/23/2021   AMS (altered mental status) 02/21/2021   Cognitive impairment 02/12/7627   Acute metabolic encephalopathy 31/51/7616   Sepsis ([place]) 01/15/2021   Acute encephalopathy 12/09/2020   Hyperbilirubinemia 12/09/2020   Hypothyroidism 12/09/2020   Memory changes 10/20/2020   Acute on chronic anemia 09/21/2020   Gastroesophageal reflux disease without esophagitis 09/16/2020   Urinary frequency 09/11/2020   Stage 1 decubitus ulcer 07/30/2020   Uncontrolled type 2 diabetes mellitus with hyperglycemia ([place]) 07/02/2020   Unstable gait 04/03/2018   Frequent falls 04/03/2018   Age-related physical debility 03/22/2018   Hematuria 01/29/2018   Skin ulcer of sacrum, limited to breakdown of skin ([place]) 03/16/2017   Spinal stenosis of lumbar region 01/13/2016   Chronic venous insufficiency 11/09/2015   DDD (degenerative disc disease), lumbosacral 11/24/2014   Constipation by delayed colonic transit 11/24/2014   Urinary tract infection without hematuria 07/03/2014   Frequent UTI 10/29/2012   Pruritus 03/17/2011   Type 2 diabetes mellitus with diabetic neuropathy ([place]) 10/19/2006   Hyperlipidemia associated with type 2 diabetes mellitus ([place]) 10/19/2006   Hypertension associated  with diabetes ([place]) 10/19/2006   Osteopenia 10/19/2006    Conditions to be addressed/monitored:DMII  Care Plan : Diabetes Type 2 (Adult)  Updates made by Lazaro Arms, RN since 09/16/2021 12:00 AM     Problem: Glycemic Management (Diabetes, Type 2)      Long-Range Goal: Glycemic Management Optimized   Start Date: 03/10/2021  Expected End Date: 10/19/2021  Recent Progress: On track  Priority: High  Note:   urrent Barriers:  Knowledge Deficits related to plan of care for management of DMII  Chronic Disease Management support and education needs related to DMII  RNCM Clinical Goal(s):  Patient/Daughter will verbalize understanding of plan for management of DMII through collaboration with RN  Care manager, provider, and care team.   Interventions: 1:1 collaboration with primary care provider regarding development and update of comprehensive plan of care as evidenced by provider attestation and co-signature Inter-disciplinary care team collaboration (see longitudinal plan of care)   Diabetes:  (Status: Goal on track: YES.) Lab Results  Component Value Date   HGBA1C 7.6 (A) 09/09/2021  Assessed patient's/daughter understanding of A1c goal:  patient will remain at current level. Reviewed medications with patient and discussed importance of medication adherence; Discussed plans with patient for ongoing care management follow up and provided patient with direct contact information for care management team; Provided patient/daughter with verbal information regarding hypo and hyperglycemia and importance of correct treatment; Advised patient, providing education and rationale, to check CBG and record, calling the office for findings outside established parameters; 09/16/21: I called and spoke with [name] today to discuss her mother's disposition. She said that she is doing better. Her bowels are moving well, and she is taking meds, and she started PT/OT with [facility] home [facility]. Her blood sugars have been elevated. The most recent  a1c was 7.6. [name] has made an appointment with the endocrinologist for 09/27/21. [name] has also asked me to correspond with [employer] regarding the patient's wheelchair. I have sent a message in epic to [employer].  Patient Goals/Self Care Activities: -Patient/Caregiver will self-administer medications as prescribed as evidenced by self-report/primary caregiver report ,  -Patient/Caregiver will attend all scheduled provider appointments as evidenced by clinician review of documented attendance to scheduled appointments and patient/caregiver report, -Patient/Caregiver will call pharmacy for medication refills as evidenced by patient report and review of pharmacy fill  history as appropriate,  -Patient/Caregiver will call provider office for new concerns or questions as evidenced by review of documented incoming telephone call notes and patient report,  -Patient/Caregiver verbalizes understanding of plan,  -Patient/Caregiver will focus on medication adherence by taking medications as prescribed -check blood sugar at prescribed times -check blood sugar if I feel it is too high or too low         Lazaro Arms RN, BSN, [place]  Phone: [phone]

## 2021-09-16 NOTE — Telephone Encounter (Signed)
Hayley Fisher from [facility] calling for nursing verbal orders as follows:  1 time(s) weekly for 3 week(s), then  Verbal orders given per [facility] protocol  Talbot Grumbling, RN

## 2021-09-16 NOTE — Patient Instructions (Signed)
Visit Information  Hayley Fisher  it was nice speaking with you. Please call me directly [phone] if you have questions about the goals we discussed.  Patient Goals/Self Care Activities: -Patient/Caregiver will self-administer medications as prescribed as evidenced by self-report/primary caregiver report ,  -Patient/Caregiver will attend all scheduled provider appointments as evidenced by clinician review of documented attendance to scheduled appointments and patient/caregiver report, -Patient/Caregiver will call pharmacy for medication refills as evidenced by patient report and review of pharmacy fill history as appropriate,  -Patient/Caregiver will call provider office for new concerns or questions as evidenced by review of documented incoming telephone call notes and patient report,  -Patient/Caregiver verbalizes understanding of plan,  -Patient/Caregiver will focus on medication adherence by taking medications as prescribed -check blood sugar at prescribed times -check blood sugar if I feel it is too high or too low   Patient verbalizes understanding of instructions and care plan provided today and agrees to view in [place]. Active MyChart status confirmed with patient.    Follow up Plan: Patient would like continued follow-up.  CCM RNCM will outreach the patient within the next 5 weeks  Patient will call office if needed prior to next encounter  Lazaro Arms, RN  [phone]

## 2021-09-17 ENCOUNTER — Telehealth: Payer: Self-pay

## 2021-09-17 DIAGNOSIS — D72819 Decreased white blood cell count, unspecified: Secondary | ICD-10-CM | POA: Diagnosis not present

## 2021-09-17 DIAGNOSIS — M48061 Spinal stenosis, lumbar region without neurogenic claudication: Secondary | ICD-10-CM | POA: Diagnosis not present

## 2021-09-17 DIAGNOSIS — I10 Essential (primary) hypertension: Secondary | ICD-10-CM | POA: Diagnosis not present

## 2021-09-17 DIAGNOSIS — M6281 Muscle weakness (generalized): Secondary | ICD-10-CM | POA: Diagnosis not present

## 2021-09-17 DIAGNOSIS — E1140 Type 2 diabetes mellitus with diabetic neuropathy, unspecified: Secondary | ICD-10-CM | POA: Diagnosis not present

## 2021-09-17 DIAGNOSIS — E1121 Type 2 diabetes mellitus with diabetic nephropathy: Secondary | ICD-10-CM | POA: Diagnosis not present

## 2021-09-17 NOTE — Telephone Encounter (Signed)
Meredith from [facility] calling for OT verbal orders as follows:  1 time(s) weekly for 1 week(s), then hold one week, then 2 time(s) weekly for 4 week(s), then 1 time(s) weekly for two weeks.   Verbal orders given per [facility] protocol  Talbot Grumbling, RN

## 2021-09-20 ENCOUNTER — Telehealth: Payer: Self-pay

## 2021-09-20 DIAGNOSIS — I10 Essential (primary) hypertension: Secondary | ICD-10-CM | POA: Diagnosis not present

## 2021-09-20 DIAGNOSIS — E1121 Type 2 diabetes mellitus with diabetic nephropathy: Secondary | ICD-10-CM | POA: Diagnosis not present

## 2021-09-20 DIAGNOSIS — E1140 Type 2 diabetes mellitus with diabetic neuropathy, unspecified: Secondary | ICD-10-CM | POA: Diagnosis not present

## 2021-09-20 DIAGNOSIS — D72819 Decreased white blood cell count, unspecified: Secondary | ICD-10-CM | POA: Diagnosis not present

## 2021-09-20 NOTE — Telephone Encounter (Signed)
Liji from [facility] calling for PT verbal orders as follows:  2 time(s) weekly for 3 week(s), then 1 time(s) weekly for 3 week(s)  Verbal orders given per [facility] protocol  Dezi Schaner C Katianna Mcclenney, RN  

## 2021-09-22 DIAGNOSIS — E1159 Type 2 diabetes mellitus with other circulatory complications: Secondary | ICD-10-CM | POA: Diagnosis not present

## 2021-09-22 DIAGNOSIS — E1140 Type 2 diabetes mellitus with diabetic neuropathy, unspecified: Secondary | ICD-10-CM | POA: Diagnosis not present

## 2021-09-22 DIAGNOSIS — I152 Hypertension secondary to endocrine disorders: Secondary | ICD-10-CM | POA: Diagnosis not present

## 2021-09-22 DIAGNOSIS — G4700 Insomnia, unspecified: Secondary | ICD-10-CM | POA: Diagnosis not present

## 2021-09-23 ENCOUNTER — Other Ambulatory Visit: Payer: Self-pay

## 2021-09-23 ENCOUNTER — Ambulatory Visit (INDEPENDENT_AMBULATORY_CARE_PROVIDER_SITE_OTHER): Payer: Medicare Other | Admitting: Orthopedic Surgery

## 2021-09-23 ENCOUNTER — Encounter: Payer: Self-pay | Admitting: Orthopedic Surgery

## 2021-09-23 VITALS — BP 134/84 | HR 90

## 2021-09-23 DIAGNOSIS — S61200A Unspecified open wound of right index finger without damage to nail, initial encounter: Secondary | ICD-10-CM | POA: Diagnosis not present

## 2021-09-23 NOTE — Progress Notes (Signed)
Office Visit Note   Patient: Hayley Fisher           Date of Birth: 12/27/1930           MRN: [id number] Visit Date: 09/23/2021              Requested by: [name], MD [address] [place],  [place] [address] PCP: [name], MD   Assessment & Plan: Visit Diagnoses:  1. Open wound of right index finger     Plan: Discussed with patient and daughter that her wound looks quite good.  They have been doing daily dressing changes.  The wound has nearly completely re-epithelialized at this point.  They can continue dressing changes for another week or two at which point it will likely be completely healed.   Follow-Up Instructions: No follow-ups on file.   Orders:  No orders of the defined types were placed in this encounter.  No orders of the defined types were placed in this encounter.     Procedures: No procedures performed   Clinical Data: No additional findings.   Subjective: Chief Complaint  Patient presents with   Right Index Finger - Follow-up    This is an 88 year old right-hand-dominant female who presents for follow-up of a right index finger burn of unknown origin.  She was last seen in mid December.  They have been doing daily dressing changes with neosporin and band-aids.  The wound looks very good today with no bleeding and near complete healing.  Patient and daughter have no concerns.    Review of Systems   Objective: Vital Signs: BP 134/84 (BP Location: Right Arm, Patient Position: Sitting, Cuff Size: Large)    Pulse 90    SpO2 95%   Physical Exam  Right Hand Exam   Tenderness  The patient is experiencing no tenderness.   Other  Erythema: absent Sensation: normal Pulse: present  Comments:  Healing wound of mid radial aspect of index finger.  No surrounding erythema or induration.  Wound nearly completely healed.      Specialty Comments:  No specialty comments available.  Imaging: No results found.   PMFS  History: Patient Active Problem List   Diagnosis Date Noted   Anxiety 09/09/2021   Goals of care, counseling/discussion 09/09/2021   Constipation 08/31/2021   Insomnia 08/31/2021   Open wound of right index finger 08/12/2021   Syncope 08/08/2021   Incontinence 07/14/2021   Pressure injury of right buttock, stage 2 ([place]) 07/14/2021   COVID-19 virus infection 06/27/2021   Normocytic anemia 06/27/2021   Leukocytopenia 06/27/2021   Asymptomatic bacteriuria 06/27/2021   Malnutrition of moderate degree 02/23/2021   AMS (altered mental status) 02/21/2021   Cognitive impairment 56/38/9373   Acute metabolic encephalopathy 42/87/6811   Sepsis ([place]) 01/15/2021   Acute encephalopathy 12/09/2020   Hyperbilirubinemia 12/09/2020   Hypothyroidism 12/09/2020   Memory changes 10/20/2020   Acute on chronic anemia 09/21/2020   Gastroesophageal reflux disease without esophagitis 09/16/2020   Urinary frequency 09/11/2020   Stage 1 decubitus ulcer 07/30/2020   Uncontrolled type 2 diabetes mellitus with hyperglycemia ([place]) 07/02/2020   Unstable gait 04/03/2018   Frequent falls 04/03/2018   Age-related physical debility 03/22/2018   Hematuria 01/29/2018   Skin ulcer of sacrum, limited to breakdown of skin ([place]) 03/16/2017   Spinal stenosis of lumbar region 01/13/2016   Chronic venous insufficiency 11/09/2015   DDD (degenerative disc disease), lumbosacral 11/24/2014   Constipation by delayed colonic transit 11/24/2014   Urinary  tract infection without hematuria 07/03/2014   Frequent UTI 10/29/2012   Pruritus 03/17/2011   Type 2 diabetes mellitus with diabetic neuropathy ([place]) 10/19/2006   Hyperlipidemia associated with type 2 diabetes mellitus ([place]) 10/19/2006   Hypertension associated with diabetes ([facility]) 10/19/2006   Osteopenia 10/19/2006   Past Medical History:  Diagnosis Date   Arthritis    Arthritis    Cataract    bil cateracts removed   Chronic kidney disease    "spot on one of my  kidneys" per pt   Diabetes mellitus    Glaucoma    Hyperkalemia 07/02/2020   Hyperlipidemia    Hyperplastic colon polyp    Hypertension    Hypothyroidism 9/47/0962   Metabolic acidosis 83/66/2947   Syncope    Thyroid disease     Family History  Problem Relation Age of Onset   Colon cancer Mother    Other Father        cerebral hemorrhage   Diabetes Brother    Diabetes Sister        x 2   Bone cancer Daughter    Breast cancer Daughter 28   Esophageal cancer Neg Hx    Rectal cancer Neg Hx    Stomach cancer Neg Hx     Past Surgical History:  Procedure Laterality Date   ABDOMINAL HYSTERECTOMY     BLADDER SUSPENSION     bladder tacking     COLONOSCOPY     EYE SURGERY     INGUINAL HERNIA REPAIR Right    IR IVC FILTER PLMT / S&I /IMG GUID/MOD SED  01/16/2021   Social History   Occupational History   Occupation: retired  Tobacco Use   Smoking status: Former    Types: Cigarettes    Quit date: 11/29/1980    Years since quitting: 40.8   Smokeless tobacco: Never  Vaping Use   Vaping Use: Never used  Substance and Sexual Activity   Alcohol use: No    Alcohol/week: 0.0 standard drinks   Drug use: No   Sexual activity: Yes    Birth control/protection: None

## 2021-09-24 DIAGNOSIS — I152 Hypertension secondary to endocrine disorders: Secondary | ICD-10-CM | POA: Diagnosis not present

## 2021-09-24 DIAGNOSIS — E1159 Type 2 diabetes mellitus with other circulatory complications: Secondary | ICD-10-CM | POA: Diagnosis not present

## 2021-09-24 DIAGNOSIS — G4700 Insomnia, unspecified: Secondary | ICD-10-CM | POA: Diagnosis not present

## 2021-09-24 DIAGNOSIS — E1140 Type 2 diabetes mellitus with diabetic neuropathy, unspecified: Secondary | ICD-10-CM | POA: Diagnosis not present

## 2021-09-28 DIAGNOSIS — G4700 Insomnia, unspecified: Secondary | ICD-10-CM | POA: Diagnosis not present

## 2021-09-28 DIAGNOSIS — I152 Hypertension secondary to endocrine disorders: Secondary | ICD-10-CM | POA: Diagnosis not present

## 2021-09-28 DIAGNOSIS — E1140 Type 2 diabetes mellitus with diabetic neuropathy, unspecified: Secondary | ICD-10-CM | POA: Diagnosis not present

## 2021-09-28 DIAGNOSIS — E1159 Type 2 diabetes mellitus with other circulatory complications: Secondary | ICD-10-CM | POA: Diagnosis not present

## 2021-09-29 DIAGNOSIS — G4700 Insomnia, unspecified: Secondary | ICD-10-CM | POA: Diagnosis not present

## 2021-09-29 DIAGNOSIS — I152 Hypertension secondary to endocrine disorders: Secondary | ICD-10-CM | POA: Diagnosis not present

## 2021-09-29 DIAGNOSIS — E1159 Type 2 diabetes mellitus with other circulatory complications: Secondary | ICD-10-CM | POA: Diagnosis not present

## 2021-09-29 DIAGNOSIS — E1140 Type 2 diabetes mellitus with diabetic neuropathy, unspecified: Secondary | ICD-10-CM | POA: Diagnosis not present

## 2021-09-30 ENCOUNTER — Other Ambulatory Visit: Payer: Self-pay

## 2021-09-30 ENCOUNTER — Ambulatory Visit (INDEPENDENT_AMBULATORY_CARE_PROVIDER_SITE_OTHER): Payer: Medicare Other | Admitting: Endocrinology

## 2021-09-30 VITALS — BP 170/84 | HR 100 | Ht 62.0 in | Wt 154.2 lb

## 2021-09-30 DIAGNOSIS — Z794 Long term (current) use of insulin: Secondary | ICD-10-CM

## 2021-09-30 DIAGNOSIS — E1140 Type 2 diabetes mellitus with diabetic neuropathy, unspecified: Secondary | ICD-10-CM | POA: Diagnosis not present

## 2021-09-30 DIAGNOSIS — E039 Hypothyroidism, unspecified: Secondary | ICD-10-CM | POA: Diagnosis not present

## 2021-09-30 DIAGNOSIS — G4700 Insomnia, unspecified: Secondary | ICD-10-CM | POA: Diagnosis not present

## 2021-09-30 DIAGNOSIS — I152 Hypertension secondary to endocrine disorders: Secondary | ICD-10-CM | POA: Diagnosis not present

## 2021-09-30 DIAGNOSIS — E1159 Type 2 diabetes mellitus with other circulatory complications: Secondary | ICD-10-CM | POA: Diagnosis not present

## 2021-09-30 LAB — POCT GLYCOSYLATED HEMOGLOBIN (HGB A1C): Hemoglobin A1C: 7.3 % — AB (ref 4.0–5.6)

## 2021-09-30 MED ORDER — LANTUS SOLOSTAR 100 UNIT/ML ~~LOC~~ SOPN: 5.0000 [IU] | PEN_INJECTOR | SUBCUTANEOUS | 1 refills | Status: DC

## 2021-09-30 NOTE — Progress Notes (Signed)
Subjective:    Patient ID: Hayley Fisher, female    DOB: Feb 19, 1931, 88 y.o.   MRN: [id number]  HPI Pt returns for f/u of diabetes mellitus:  DM type: Insulin-requiring type 2  Dx'ed: 0623 Complications: DR and PN  Therapy: insulin since 2021, and 2 oral meds.  GDM: never DKA: never Severe hypoglycemia: never Pancreatitis: never Pancreatic imaging: normal on 2021 CT SDOH: dtr Elmyra Ricks gives pt her insulin, and checks pt's cbg.   Other: She prefers vial over pen Interval history: no cbg record, but states cbg's varies from 90-250.  It is in general higher as the day goes on.  Pt is here with [name].  Dtr Richrd Sox is on the phone.   Pt also has chronic primary hypothyroidism (she has never had thyroid imaging).  She takes synthroid as rx'ed.   Past Medical History:  Diagnosis Date   Arthritis    Arthritis    Cataract    bil cateracts removed   Chronic kidney disease    "spot on one of my kidneys" per pt   Diabetes mellitus    Glaucoma    Hyperkalemia 07/02/2020   Hyperlipidemia    Hyperplastic colon polyp    Hypertension    Hypothyroidism 7/62/8315   Metabolic acidosis 17/61/6073   Syncope    Thyroid disease     Past Surgical History:  Procedure Laterality Date   ABDOMINAL HYSTERECTOMY     BLADDER SUSPENSION     bladder tacking     COLONOSCOPY     EYE SURGERY     INGUINAL HERNIA REPAIR Right    IR IVC FILTER PLMT / S&I /IMG GUID/MOD SED  01/16/2021    Social History   Socioeconomic History   Marital status: Single    Spouse name: Not on file   Number of children: 4   Years of education: Not on file   Highest education level: Not on file  Occupational History   Occupation: retired  Tobacco Use   Smoking status: Former    Types: Cigarettes    Quit date: 11/29/1980    Years since quitting: 40.8   Smokeless tobacco: Never  Vaping Use   Vaping Use: Never used  Substance and Sexual Activity   Alcohol use: No    Alcohol/week: 0.0 standard  drinks   Drug use: No   Sexual activity: Yes    Birth control/protection: None  Other Topics Concern   Not on file  Social History Narrative   Not on file   Social Determinants of Health   Financial Resource Strain: Not on file  Food Insecurity: Not on file  Transportation Needs: Not on file  Physical Activity: Not on file  Stress: Not on file  Social Connections: Not on file  Intimate Partner Violence: Not on file    Current Outpatient Medications on File Prior to Visit  Medication Sig Dispense Refill   Accu-Chek Softclix Lancets lancets 1 each by Other route See admin instructions. USE TO TEST BLOOD SUGAR UP  TO 3 TIMES A DAY.     aspirin 81 MG chewable tablet Chew 81 mg by mouth every morning.     atorvastatin (LIPITOR) 40 MG tablet TAKE 1 TABLET BY MOUTH EVERY DAY (Patient taking differently: Take 40 mg by mouth daily.) 90 tablet 3   brimonidine (ALPHAGAN) 0.2 % ophthalmic solution Place 1 drop into the left eye 2 (two) times daily.     busPIRone (BUSPAR) 5 MG tablet Take 1 tablet (5  mg total) by mouth 3 (three) times daily. 60 tablet 1   carbamide peroxide (DEBROX) 6.5 % OTIC solution Place 2 drops into both ears 2 (two) times daily as needed (ear wax).     Catheter Self-Adhesive Urinary MISC 1 patch by Does not apply route daily. 12 each 0   cetirizine (ZYRTEC) 10 MG tablet Take 10 mg by mouth daily as needed for allergies.     clotrimazole-betamethasone (LOTRISONE) cream Apply 1 application topically 2 (two) times daily as needed (rash/itching).     dorzolamide-timolol (COSOPT) 22.3-6.8 MG/ML ophthalmic solution Place 1 drop into both eyes 2 (two) times daily.     DULoxetine (CYMBALTA) 60 MG capsule Take 1 capsule (60 mg total) by mouth daily.     enalapril (VASOTEC) 20 MG tablet Take 1 tablet (20 mg total) by mouth daily. 90 tablet 3   famotidine (PEPCID) 20 MG tablet Take 1 tablet (20 mg total) by mouth daily.     ferrous sulfate 325 (65 FE) MG tablet Take 325 mg by mouth  daily with breakfast.     GLOBAL EASE INJECT PEN NEEDLES 32G X 4 MM MISC USE 1 SYRINGE DAILY TO INJECT VICTOZA 100 each 0   glucose blood (ACCU-CHEK AVIVA PLUS) test strip 1 each by Other route See admin instructions. USE TO TEST BLOOD SUGAR UP  TO 3 TIMES A DAY.     hydrocortisone (ANUSOL-HC) 2.5 % rectal cream Place 1 application rectally 2 (two) times daily as needed for hemorrhoids or anal itching. Please use twice daily x5 days, and then twice daily as needed. 30 g 0   Insulin Syringe-Needle U-100 (SURE COMFORT INSULIN SYRINGE) 31G X 5/16" 0.3 ML MISC Inject 1 Syringe into the skin daily.     ketoconazole (NIZORAL) 2 % cream Apply 1 application topically daily as needed for irritation.     Latanoprostene Bunod (VYZULTA) 0.024 % SOLN Place 1 drop into both eyes at bedtime.     levothyroxine (SYNTHROID) 50 MCG tablet Take 1 tablet (50 mcg total) by mouth daily. 90 tablet 3   metFORMIN (GLUCOPHAGE-XR) 500 MG 24 hr tablet Take 2 tablets (1,000 mg total) by mouth daily with breakfast. 180 tablet 3   mirabegron ER (MYRBETRIQ) 25 MG TB24 tablet Take 1 tablet (25 mg total) by mouth daily. 30 tablet 1   Misc. Devices (TRANSFER BOARD) MISC Use as needed for transfer from bed to chair or vice versa 1 each 0   Multiple Vitamins-Minerals (PRESERVISION AREDS 2+MULTI VIT) CAPS Take 1 capsule by mouth daily.     ondansetron (ZOFRAN ODT) 4 MG disintegrating tablet Take 1 tablet (4 mg total) by mouth every 8 (eight) hours as needed for nausea. 10 tablet 0   polyethylene glycol powder (GLYCOLAX/MIRALAX) 17 GM/SCOOP powder Take 17 g by mouth in the morning and at bedtime. 238 g 3   SARNA lotion Apply 1 application topically 4 (four) times daily as needed for itching.     Semaglutide (RYBELSUS) 3 MG TABS Take 3 mg by mouth daily. 30 tablet 11   senna (SENOKOT) 8.6 MG TABS tablet Take 1 tablet (8.6 mg total) by mouth 2 (two) times daily. 120 tablet 0   tamsulosin (FLOMAX) 0.4 MG CAPS capsule Take 0.4 mg by mouth  every morning.     witch hazel-glycerin (TUCKS) pad Apply 1 application topically 2 (two) times daily as needed for itching.     No current facility-administered medications on file prior to visit.    Allergies  Allergen Reactions   Tramadol Itching    Takes occasionally with benadryl   Levemir [Insulin Detemir] Itching    Family History  Problem Relation Age of Onset   Colon cancer Mother    Other Father        cerebral hemorrhage   Diabetes Brother    Diabetes Sister        x 2   Bone cancer Daughter    Breast cancer Daughter 48   Esophageal cancer Neg Hx    Rectal cancer Neg Hx    Stomach cancer Neg Hx     BP (!) 170/84    Pulse 100    Ht 5\' 2"  (1.575 m)    Wt 154 lb 3.2 oz (69.9 kg)    SpO2 93%    BMI 28.20 kg/m   Review of Systems She denies hypoglycemia/N/V/HB    Objective:   Physical Exam    Lab Results  Component Value Date   HGBA1C 7.3 (A) 09/30/2021   Lab Results  Component Value Date   TSH 5.373 (H) 08/08/2021      Assessment & Plan:  Insulin-requiring type 2 DM:  we discussed.  I advised pt to d/c insulin, and increase Rybelsus.  Dtr Richrd Sox declines to d/c insulin Hypothyroidism: recheck today.    Patient Instructions  check your blood sugar 4 times a day.  vary the time of day when you check, between before the 3 meals, and at bedtime.  also check if you have symptoms of your blood sugar being too high or too low.  please keep a record of the readings and bring it to your next appointment here (or you can bring the meter itself).  You can write it on any piece of paper.  please call us sooner if your blood sugar goes below 70, or if most of your readings are over 200.   Please reduce the insulin to 5 mg each morning.   I have sent a prescription to your pharmacy, to change metformin to extended-release.   Please continue the same other medications Blood tests are requested for you today.  We'll let you know about the results.  Please come back  for a follow-up appointment in 2 months.

## 2021-09-30 NOTE — Patient Instructions (Addendum)
check your blood sugar 4 times a day.  vary the time of day when you check, between before the 3 meals, and at bedtime.  also check if you have symptoms of your blood sugar being too high or too low.  please keep a record of the readings and bring it to your next appointment here (or you can bring the meter itself).  You can write it on any piece of paper.  please call us sooner if your blood sugar goes below 70, or if most of your readings are over 200.   Please reduce the insulin to 5 mg each morning.   I have sent a prescription to your pharmacy, to change metformin to extended-release.   Please continue the same other medications Blood tests are requested for you today.  We'll let you know about the results.  Please come back for a follow-up appointment in 2 months.

## 2021-10-01 ENCOUNTER — Other Ambulatory Visit: Payer: Self-pay | Admitting: Student

## 2021-10-01 ENCOUNTER — Telehealth: Payer: Self-pay

## 2021-10-01 DIAGNOSIS — F419 Anxiety disorder, unspecified: Secondary | ICD-10-CM

## 2021-10-01 DIAGNOSIS — E1140 Type 2 diabetes mellitus with diabetic neuropathy, unspecified: Secondary | ICD-10-CM | POA: Diagnosis not present

## 2021-10-01 DIAGNOSIS — G4700 Insomnia, unspecified: Secondary | ICD-10-CM | POA: Diagnosis not present

## 2021-10-01 DIAGNOSIS — E1159 Type 2 diabetes mellitus with other circulatory complications: Secondary | ICD-10-CM | POA: Diagnosis not present

## 2021-10-01 DIAGNOSIS — I152 Hypertension secondary to endocrine disorders: Secondary | ICD-10-CM | POA: Diagnosis not present

## 2021-10-01 LAB — T4, FREE: Free T4: 1.05 ng/dL (ref 0.60–1.60)

## 2021-10-01 LAB — TSH: TSH: 4.27 u[IU]/mL (ref 0.35–5.50)

## 2021-10-01 NOTE — Telephone Encounter (Signed)
Hayley Fisher, [facility] RN with Hayley Fisher, calls nurse line regarding elevated BP reading at yesterday's visit. BP was 175/87. Patient asymptomatic and has been taking medications as prescribed.   Patient had visit with endocrinologist yesterday in which BP remained elevated at 170/84.   Called to check on patient today. Spoke with daughter. Reports that patient is feeling fine and that she continues to have no symptoms associated with elevated BP. Daughter reports that home health nurse will check BP at visits, however, she is unable to check daily.   Patient has follow up appointment at the end of the month with PCP. Daughter will call our office with any further concerns regarding blood pressure.   Hayley Grumbling, RN

## 2021-10-04 DIAGNOSIS — E1140 Type 2 diabetes mellitus with diabetic neuropathy, unspecified: Secondary | ICD-10-CM | POA: Diagnosis not present

## 2021-10-04 DIAGNOSIS — G4700 Insomnia, unspecified: Secondary | ICD-10-CM | POA: Diagnosis not present

## 2021-10-04 DIAGNOSIS — I152 Hypertension secondary to endocrine disorders: Secondary | ICD-10-CM | POA: Diagnosis not present

## 2021-10-04 DIAGNOSIS — E1159 Type 2 diabetes mellitus with other circulatory complications: Secondary | ICD-10-CM | POA: Diagnosis not present

## 2021-10-05 ENCOUNTER — Other Ambulatory Visit: Payer: Self-pay | Admitting: Student

## 2021-10-05 DIAGNOSIS — E1159 Type 2 diabetes mellitus with other circulatory complications: Secondary | ICD-10-CM | POA: Diagnosis not present

## 2021-10-05 DIAGNOSIS — R350 Frequency of micturition: Secondary | ICD-10-CM

## 2021-10-05 DIAGNOSIS — G4700 Insomnia, unspecified: Secondary | ICD-10-CM | POA: Diagnosis not present

## 2021-10-05 DIAGNOSIS — I152 Hypertension secondary to endocrine disorders: Secondary | ICD-10-CM | POA: Diagnosis not present

## 2021-10-05 DIAGNOSIS — E1140 Type 2 diabetes mellitus with diabetic neuropathy, unspecified: Secondary | ICD-10-CM | POA: Diagnosis not present

## 2021-10-06 ENCOUNTER — Other Ambulatory Visit: Payer: Self-pay

## 2021-10-06 DIAGNOSIS — K219 Gastro-esophageal reflux disease without esophagitis: Secondary | ICD-10-CM

## 2021-10-06 DIAGNOSIS — E1159 Type 2 diabetes mellitus with other circulatory complications: Secondary | ICD-10-CM | POA: Diagnosis not present

## 2021-10-06 DIAGNOSIS — I152 Hypertension secondary to endocrine disorders: Secondary | ICD-10-CM | POA: Diagnosis not present

## 2021-10-06 DIAGNOSIS — G4700 Insomnia, unspecified: Secondary | ICD-10-CM | POA: Diagnosis not present

## 2021-10-06 DIAGNOSIS — E1140 Type 2 diabetes mellitus with diabetic neuropathy, unspecified: Secondary | ICD-10-CM | POA: Diagnosis not present

## 2021-10-06 MED ORDER — FAMOTIDINE 20 MG PO TABS: 20.0000 mg | ORAL_TABLET | Freq: Every day | ORAL | 0 refills | Status: DC

## 2021-10-08 DIAGNOSIS — E1159 Type 2 diabetes mellitus with other circulatory complications: Secondary | ICD-10-CM | POA: Diagnosis not present

## 2021-10-08 DIAGNOSIS — G4700 Insomnia, unspecified: Secondary | ICD-10-CM | POA: Diagnosis not present

## 2021-10-08 DIAGNOSIS — E1140 Type 2 diabetes mellitus with diabetic neuropathy, unspecified: Secondary | ICD-10-CM | POA: Diagnosis not present

## 2021-10-08 DIAGNOSIS — I152 Hypertension secondary to endocrine disorders: Secondary | ICD-10-CM | POA: Diagnosis not present

## 2021-10-11 DIAGNOSIS — E1159 Type 2 diabetes mellitus with other circulatory complications: Secondary | ICD-10-CM | POA: Diagnosis not present

## 2021-10-11 DIAGNOSIS — G4700 Insomnia, unspecified: Secondary | ICD-10-CM | POA: Diagnosis not present

## 2021-10-11 DIAGNOSIS — I152 Hypertension secondary to endocrine disorders: Secondary | ICD-10-CM | POA: Diagnosis not present

## 2021-10-11 DIAGNOSIS — E1140 Type 2 diabetes mellitus with diabetic neuropathy, unspecified: Secondary | ICD-10-CM | POA: Diagnosis not present

## 2021-10-12 DIAGNOSIS — R3914 Feeling of incomplete bladder emptying: Secondary | ICD-10-CM | POA: Diagnosis not present

## 2021-10-13 DIAGNOSIS — E1140 Type 2 diabetes mellitus with diabetic neuropathy, unspecified: Secondary | ICD-10-CM | POA: Diagnosis not present

## 2021-10-13 DIAGNOSIS — I152 Hypertension secondary to endocrine disorders: Secondary | ICD-10-CM | POA: Diagnosis not present

## 2021-10-13 DIAGNOSIS — E1159 Type 2 diabetes mellitus with other circulatory complications: Secondary | ICD-10-CM | POA: Diagnosis not present

## 2021-10-13 DIAGNOSIS — G4700 Insomnia, unspecified: Secondary | ICD-10-CM | POA: Diagnosis not present

## 2021-10-18 DIAGNOSIS — E1159 Type 2 diabetes mellitus with other circulatory complications: Secondary | ICD-10-CM | POA: Diagnosis not present

## 2021-10-18 DIAGNOSIS — M6281 Muscle weakness (generalized): Secondary | ICD-10-CM | POA: Diagnosis not present

## 2021-10-18 DIAGNOSIS — E1140 Type 2 diabetes mellitus with diabetic neuropathy, unspecified: Secondary | ICD-10-CM | POA: Diagnosis not present

## 2021-10-18 DIAGNOSIS — G4700 Insomnia, unspecified: Secondary | ICD-10-CM | POA: Diagnosis not present

## 2021-10-18 DIAGNOSIS — I152 Hypertension secondary to endocrine disorders: Secondary | ICD-10-CM | POA: Diagnosis not present

## 2021-10-18 DIAGNOSIS — M48061 Spinal stenosis, lumbar region without neurogenic claudication: Secondary | ICD-10-CM | POA: Diagnosis not present

## 2021-10-18 NOTE — Progress Notes (Unsigned)
° ° °  SUBJECTIVE:   CHIEF COMPLAINT / HPI:   HTN Had elevated BP to 170/84 at endocrinology office. BP today is ***   PERTINENT  PMH / PSH: ***  OBJECTIVE:   There were no vitals taken for this visit.  ***  ASSESSMENT/PLAN:   No problem-specific Assessment & Plan notes found for this encounter.     [name], MD [place]

## 2021-10-19 ENCOUNTER — Ambulatory Visit: Payer: Medicare Other | Admitting: Student

## 2021-10-19 DIAGNOSIS — I152 Hypertension secondary to endocrine disorders: Secondary | ICD-10-CM | POA: Diagnosis not present

## 2021-10-19 DIAGNOSIS — E1159 Type 2 diabetes mellitus with other circulatory complications: Secondary | ICD-10-CM | POA: Diagnosis not present

## 2021-10-19 DIAGNOSIS — E1140 Type 2 diabetes mellitus with diabetic neuropathy, unspecified: Secondary | ICD-10-CM | POA: Diagnosis not present

## 2021-10-19 DIAGNOSIS — G4700 Insomnia, unspecified: Secondary | ICD-10-CM | POA: Diagnosis not present

## 2021-10-20 DIAGNOSIS — E1140 Type 2 diabetes mellitus with diabetic neuropathy, unspecified: Secondary | ICD-10-CM | POA: Diagnosis not present

## 2021-10-20 DIAGNOSIS — G4700 Insomnia, unspecified: Secondary | ICD-10-CM | POA: Diagnosis not present

## 2021-10-20 DIAGNOSIS — E1159 Type 2 diabetes mellitus with other circulatory complications: Secondary | ICD-10-CM | POA: Diagnosis not present

## 2021-10-20 DIAGNOSIS — I152 Hypertension secondary to endocrine disorders: Secondary | ICD-10-CM | POA: Diagnosis not present

## 2021-10-25 DIAGNOSIS — E1140 Type 2 diabetes mellitus with diabetic neuropathy, unspecified: Secondary | ICD-10-CM | POA: Diagnosis not present

## 2021-10-25 DIAGNOSIS — G4700 Insomnia, unspecified: Secondary | ICD-10-CM | POA: Diagnosis not present

## 2021-10-25 DIAGNOSIS — E1159 Type 2 diabetes mellitus with other circulatory complications: Secondary | ICD-10-CM | POA: Diagnosis not present

## 2021-10-25 DIAGNOSIS — I152 Hypertension secondary to endocrine disorders: Secondary | ICD-10-CM | POA: Diagnosis not present

## 2021-10-26 ENCOUNTER — Ambulatory Visit: Payer: Medicare Other

## 2021-10-26 NOTE — Chronic Care Management (AMB) (Signed)
? ?  RN Case Manager ?Care Management  ? Phone Outreach  ? ? ?10/26/2021 ?Name: Hayley Fisher MRN: [id number] DOB: Feb 01, 1931 ? ?Hayley Fisher is a 88 y.o. year old female who is a primary care patient of [name], MD .  ? ?Unable to keep phone appointment today and requested to reschedule. ?Daughter wanted to call back around 2 pm. ? ?Follow Up Plan: Appointment was rescheduled with CCM RN until later this afternoon around 2 pm. ? ?Addendum: As of 2 pm no return call from the daughter CM RN will try to reach [facility] tomorrow 10/27/21. ? ?Review of patient status, including review of consultants reports, relevant laboratory and other test results, and collaboration with appropriate care team members and the patient's provider was performed as part of comprehensive patient evaluation and provision of care management services.   ? ?Lazaro Arms RN, BSN, [facility] ?Care Management Coordinator ?[place] ?Phone: [phone] Fax: [phone] ?  ? ? ? ? ? ? ? ? ? ? ? ?

## 2021-10-28 ENCOUNTER — Other Ambulatory Visit: Payer: Self-pay | Admitting: Student

## 2021-10-28 DIAGNOSIS — F419 Anxiety disorder, unspecified: Secondary | ICD-10-CM

## 2021-10-29 DIAGNOSIS — E1159 Type 2 diabetes mellitus with other circulatory complications: Secondary | ICD-10-CM | POA: Diagnosis not present

## 2021-10-29 DIAGNOSIS — I152 Hypertension secondary to endocrine disorders: Secondary | ICD-10-CM | POA: Diagnosis not present

## 2021-10-29 DIAGNOSIS — E1140 Type 2 diabetes mellitus with diabetic neuropathy, unspecified: Secondary | ICD-10-CM | POA: Diagnosis not present

## 2021-10-29 DIAGNOSIS — G4700 Insomnia, unspecified: Secondary | ICD-10-CM | POA: Diagnosis not present

## 2021-11-01 ENCOUNTER — Other Ambulatory Visit: Payer: Self-pay | Admitting: Student

## 2021-11-01 DIAGNOSIS — R350 Frequency of micturition: Secondary | ICD-10-CM

## 2021-11-01 NOTE — Progress Notes (Unsigned)
° ° °  SUBJECTIVE:   CHIEF COMPLAINT / HPI:   ***  PERTINENT  PMH / PSH: ***  OBJECTIVE:   There were no vitals taken for this visit.  ***  ASSESSMENT/PLAN:   No problem-specific Assessment & Plan notes found for this encounter.     [name], MD [place]

## 2021-11-02 ENCOUNTER — Other Ambulatory Visit: Payer: Self-pay

## 2021-11-02 ENCOUNTER — Ambulatory Visit (INDEPENDENT_AMBULATORY_CARE_PROVIDER_SITE_OTHER): Payer: Medicare Other | Admitting: Student

## 2021-11-02 ENCOUNTER — Encounter: Payer: Self-pay | Admitting: Student

## 2021-11-02 VITALS — BP 120/62 | Ht 62.0 in | Wt 147.2 lb

## 2021-11-02 DIAGNOSIS — L98421 Non-pressure chronic ulcer of back limited to breakdown of skin: Secondary | ICD-10-CM

## 2021-11-02 DIAGNOSIS — F5101 Primary insomnia: Secondary | ICD-10-CM

## 2021-11-02 DIAGNOSIS — M7989 Other specified soft tissue disorders: Secondary | ICD-10-CM | POA: Diagnosis not present

## 2021-11-02 DIAGNOSIS — F419 Anxiety disorder, unspecified: Secondary | ICD-10-CM

## 2021-11-02 DIAGNOSIS — E1159 Type 2 diabetes mellitus with other circulatory complications: Secondary | ICD-10-CM | POA: Diagnosis not present

## 2021-11-02 DIAGNOSIS — I152 Hypertension secondary to endocrine disorders: Secondary | ICD-10-CM | POA: Diagnosis not present

## 2021-11-02 DIAGNOSIS — Z95828 Presence of other vascular implants and grafts: Secondary | ICD-10-CM | POA: Diagnosis not present

## 2021-11-02 DIAGNOSIS — I10 Essential (primary) hypertension: Secondary | ICD-10-CM

## 2021-11-02 DIAGNOSIS — R350 Frequency of micturition: Secondary | ICD-10-CM

## 2021-11-02 NOTE — Patient Instructions (Signed)
It was great to see you! Thank you for allowing me to participate in your care!  ? ?I recommend that you always bring your medications to each appointment as this makes it easy to ensure we are on the correct medications and helps [place] not miss when refills are needed. ? ?Our plans for today:  ?- I have ordered a D-dimer test, if positive I may refer you for an ultrasound of your leg ?- I have put in a DME order for an offloading pillow ? ?We are checking some labs today, I will call you if they are abnormal will send you a MyChart message or a letter if they are normal.  If you do not hear about your labs in the next 2 weeks please let us know. ? ?Take care and seek immediate care sooner if you develop any concerns. Please remember to show up 15 minutes before your scheduled appointment time! ? ?[name], MD ?[facility]  ?

## 2021-11-03 ENCOUNTER — Telehealth: Payer: Self-pay

## 2021-11-03 ENCOUNTER — Other Ambulatory Visit: Payer: Self-pay | Admitting: Student

## 2021-11-03 DIAGNOSIS — M7989 Other specified soft tissue disorders: Secondary | ICD-10-CM

## 2021-11-03 LAB — D-DIMER, QUANTITATIVE: D-DIMER: 2.39 mg/L FEU — ABNORMAL HIGH (ref 0.00–0.49)

## 2021-11-03 MED ORDER — ENALAPRIL MALEATE 20 MG PO TABS: 20.0000 mg | ORAL_TABLET | Freq: Every day | ORAL | 3 refills | Status: DC

## 2021-11-03 MED ORDER — MIRABEGRON ER 25 MG PO TB24: 25.0000 mg | ORAL_TABLET | Freq: Every day | ORAL | 1 refills | Status: DC

## 2021-11-03 NOTE — Assessment & Plan Note (Deleted)
Healed over, no active lesion. ?-Ordered DME offloading patch/pillow ?

## 2021-11-03 NOTE — Assessment & Plan Note (Signed)
Improved ?-monitor ?

## 2021-11-03 NOTE — Telephone Encounter (Signed)
-----   Message from [name], MD sent at 11/03/2021  2:29 PM EDT ----- ?Regarding: Scheduling for STAT DVT u/s ?Hello, ? ?Patient's d-dimer was elevated to 2.39. Discussed with daughter [name] that we would call to schedule this imaging study. Are you all able to schedule this for either today or tomorrow? Please let me know if any issues. Thanks so much, ? ?Mayuri ? ?

## 2021-11-03 NOTE — Assessment & Plan Note (Addendum)
Hx of DVT in May 2022. Denies any new swelling but does have increased girth on right thigh over left without pain erythema or warmth. Well's score with moderate risk and recommendation for D-Dimer. Discussed with patient and family and will obtain D-Dimer and consider imaging if elevated. ?-F/u d-dimer ?

## 2021-11-03 NOTE — Assessment & Plan Note (Signed)
Currently controlled with therapy. D/c'd buspar d/t patient preference ?

## 2021-11-03 NOTE — Assessment & Plan Note (Signed)
Healed over, no active lesion. ?-Ordered DME offloading patch/pillow ?

## 2021-11-03 NOTE — Assessment & Plan Note (Signed)
Controlled today 

## 2021-11-03 NOTE — Telephone Encounter (Signed)
Spoke with patients sister Stanton Kidney. Informed her of the appt for U/S on 3/16 at 10:00 at [facility]. Patients sister understood. Salvatore Marvel, CMA ? ?

## 2021-11-03 NOTE — Progress Notes (Signed)
Called and spoke with daughter [name] about D-Dimer results and that it was elevated to 2.39. Given patient's history of DVT and swelling in leg will proceed with stat DVT u/s. Discussed with daughter that CMA will be in touch to schedule this for today or tomorrow. Discussed return precautions of pulmonary embolism with daughter such as shortness of breath, pleuritic chest pain. All questions answered ?

## 2021-11-04 ENCOUNTER — Telehealth: Payer: Self-pay | Admitting: Family Medicine

## 2021-11-04 ENCOUNTER — Encounter (HOSPITAL_COMMUNITY): Payer: Self-pay | Admitting: Emergency Medicine

## 2021-11-04 ENCOUNTER — Other Ambulatory Visit: Payer: Self-pay

## 2021-11-04 ENCOUNTER — Ambulatory Visit (HOSPITAL_BASED_OUTPATIENT_CLINIC_OR_DEPARTMENT_OTHER)
Admission: RE | Admit: 2021-11-04 | Discharge: 2021-11-04 | Disposition: A | Discharge status: Home / Self Care | Payer: Medicare Other | Source: Ambulatory Visit | Attending: Family Medicine | Admitting: Family Medicine

## 2021-11-04 ENCOUNTER — Observation Stay (HOSPITAL_COMMUNITY)
Admission: EM | Admit: 2021-11-04 | Discharge: 2021-11-05 | Disposition: A | Discharge status: Home Health | Payer: Medicare Other | Attending: Family Medicine | Admitting: Family Medicine

## 2021-11-04 DIAGNOSIS — E039 Hypothyroidism, unspecified: Secondary | ICD-10-CM | POA: Diagnosis not present

## 2021-11-04 DIAGNOSIS — Z7984 Long term (current) use of oral hypoglycemic drugs: Secondary | ICD-10-CM | POA: Insufficient documentation

## 2021-11-04 DIAGNOSIS — I82411 Acute embolism and thrombosis of right femoral vein: Secondary | ICD-10-CM | POA: Diagnosis not present

## 2021-11-04 DIAGNOSIS — Z79890 Hormone replacement therapy (postmenopausal): Secondary | ICD-10-CM | POA: Insufficient documentation

## 2021-11-04 DIAGNOSIS — L8990 Pressure ulcer of unspecified site, unspecified stage: Secondary | ICD-10-CM | POA: Insufficient documentation

## 2021-11-04 DIAGNOSIS — Z87891 Personal history of nicotine dependence: Secondary | ICD-10-CM | POA: Insufficient documentation

## 2021-11-04 DIAGNOSIS — E119 Type 2 diabetes mellitus without complications: Secondary | ICD-10-CM | POA: Diagnosis not present

## 2021-11-04 DIAGNOSIS — E1140 Type 2 diabetes mellitus with diabetic neuropathy, unspecified: Secondary | ICD-10-CM

## 2021-11-04 DIAGNOSIS — Z79899 Other long term (current) drug therapy: Secondary | ICD-10-CM | POA: Insufficient documentation

## 2021-11-04 DIAGNOSIS — Z794 Long term (current) use of insulin: Secondary | ICD-10-CM | POA: Diagnosis not present

## 2021-11-04 DIAGNOSIS — I129 Hypertensive chronic kidney disease with stage 1 through stage 4 chronic kidney disease, or unspecified chronic kidney disease: Secondary | ICD-10-CM | POA: Insufficient documentation

## 2021-11-04 DIAGNOSIS — Z7982 Long term (current) use of aspirin: Secondary | ICD-10-CM | POA: Insufficient documentation

## 2021-11-04 DIAGNOSIS — E1122 Type 2 diabetes mellitus with diabetic chronic kidney disease: Secondary | ICD-10-CM | POA: Insufficient documentation

## 2021-11-04 DIAGNOSIS — M7989 Other specified soft tissue disorders: Secondary | ICD-10-CM

## 2021-11-04 DIAGNOSIS — I82409 Acute embolism and thrombosis of unspecified deep veins of unspecified lower extremity: Secondary | ICD-10-CM | POA: Diagnosis present

## 2021-11-04 DIAGNOSIS — N189 Chronic kidney disease, unspecified: Secondary | ICD-10-CM | POA: Insufficient documentation

## 2021-11-04 DIAGNOSIS — Z86718 Personal history of other venous thrombosis and embolism: Secondary | ICD-10-CM | POA: Diagnosis present

## 2021-11-04 LAB — BASIC METABOLIC PANEL
Anion gap: 10 (ref 5–15)
BUN: 15 mg/dL (ref 8–23)
CO2: 27 mmol/L (ref 22–32)
Calcium: 8.7 mg/dL — ABNORMAL LOW (ref 8.9–10.3)
Chloride: 99 mmol/L (ref 98–111)
Creatinine, Ser: 0.86 mg/dL (ref 0.44–1.00)
GFR, Estimated: >60 mL/min (ref >60)
Glucose, Bld: 281 mg/dL — ABNORMAL HIGH (ref 70–99)
Potassium: 4.4 mmol/L (ref 3.5–5.1)
Sodium: 136 mmol/L (ref 135–145)

## 2021-11-04 LAB — CBC WITH DIFFERENTIAL/PLATELET
Abs Immature Granulocytes: 0.01 10*3/uL (ref 0.00–0.07)
Basophils Absolute: 0 10*3/uL (ref 0.0–0.1)
Basophils Relative: 0 %
Eosinophils Absolute: 0.1 10*3/uL (ref 0.0–0.5)
Eosinophils Relative: 2 %
HCT: 33.3 % — ABNORMAL LOW (ref 36.0–46.0)
Hemoglobin: 10.6 g/dL — ABNORMAL LOW (ref 12.0–15.0)
Immature Granulocytes: 0 %
Lymphocytes Relative: 19 %
Lymphs Abs: 0.6 10*3/uL — ABNORMAL LOW (ref 0.7–4.0)
MCH: 30.9 pg (ref 26.0–34.0)
MCHC: 31.8 g/dL (ref 30.0–36.0)
MCV: 97.1 fL (ref 80.0–100.0)
Monocytes Absolute: 0.2 10*3/uL (ref 0.1–1.0)
Monocytes Relative: 8 %
Neutro Abs: 2 10*3/uL (ref 1.7–7.7)
Neutrophils Relative %: 71 %
Platelets: 203 10*3/uL (ref 150–400)
RBC: 3.43 MIL/uL — ABNORMAL LOW (ref 3.87–5.11)
RDW: 13.2 % (ref 11.5–15.5)
WBC: 2.9 10*3/uL — ABNORMAL LOW (ref 4.0–10.5)
nRBC: 0 % (ref 0.0–0.2)

## 2021-11-04 LAB — GLUCOSE, CAPILLARY
Glucose-Capillary: 185 mg/dL — ABNORMAL HIGH (ref 70–99)
Glucose-Capillary: 135 mg/dL — ABNORMAL HIGH (ref 70–99)

## 2021-11-04 LAB — URINALYSIS, ROUTINE W REFLEX MICROSCOPIC
Bilirubin Urine: NEGATIVE
Glucose, UA: >=500 mg/dL — AB
Ketones, ur: NEGATIVE mg/dL
Nitrite: NEGATIVE
Protein, ur: 30 mg/dL — AB
Specific Gravity, Urine: 1.008 (ref 1.005–1.030)
WBC, UA: >50 WBC/hpf — ABNORMAL HIGH (ref 0–5)
pH: 6 (ref 5.0–8.0)

## 2021-11-04 LAB — HEPARIN LEVEL (UNFRACTIONATED): Heparin Unfractionated: 0.69 IU/mL (ref 0.30–0.70)

## 2021-11-04 MED ORDER — ASPIRIN 81 MG PO CHEW
81.0000 mg | CHEWABLE_TABLET | Freq: Every morning | ORAL | Status: DC
Start: 2021-11-05 — End: 2021-11-05

## 2021-11-04 MED ORDER — BRIMONIDINE TARTRATE 0.2 % OP SOLN
1.0000 [drp] | Freq: Two times a day (BID) | OPHTHALMIC | Status: DC
  Administered 2021-11-04 – 2021-11-05 (×2): 1 [drp] via OPHTHALMIC
  Filled 2021-11-04: qty 5

## 2021-11-04 MED ORDER — LATANOPROST 0.005 % OP SOLN
1.0000 [drp] | Freq: Every day | OPHTHALMIC | Status: DC
  Administered 2021-11-04: 1 [drp] via OPHTHALMIC
  Filled 2021-11-04: qty 2.5

## 2021-11-04 MED ORDER — DORZOLAMIDE HCL-TIMOLOL MAL 2-0.5 % OP SOLN
1.0000 [drp] | Freq: Two times a day (BID) | OPHTHALMIC | Status: DC
  Administered 2021-11-04 – 2021-11-05 (×2): 1 [drp] via OPHTHALMIC
  Filled 2021-11-04: qty 10

## 2021-11-04 MED ORDER — ATORVASTATIN CALCIUM 40 MG PO TABS
40.0000 mg | ORAL_TABLET | Freq: Every day | ORAL | Status: DC
Start: 2021-11-05 — End: 2021-11-06
  Administered 2021-11-05: 40 mg via ORAL
  Filled 2021-11-04: qty 1

## 2021-11-04 MED ORDER — POLYETHYLENE GLYCOL 3350 17 G PO PACK
17.0000 g | PACK | Freq: Two times a day (BID) | ORAL | Status: DC
  Administered 2021-11-04: 17 g via ORAL
  Filled 2021-11-04: qty 1

## 2021-11-04 MED ORDER — MIRABEGRON ER 25 MG PO TB24
25.0000 mg | ORAL_TABLET | Freq: Every day | ORAL | Status: DC
Start: 2021-11-05 — End: 2021-11-05
  Filled 2021-11-04: qty 1

## 2021-11-04 MED ORDER — DULOXETINE HCL 60 MG PO CPEP
60.0000 mg | ORAL_CAPSULE | Freq: Every day | ORAL | Status: DC
  Administered 2021-11-05: 60 mg via ORAL
  Filled 2021-11-04: qty 1

## 2021-11-04 MED ORDER — ACETAMINOPHEN 650 MG RE SUPP
650.0000 mg | Freq: Four times a day (QID) | RECTAL | Status: DC | PRN
Start: 2021-11-04 — End: 2021-11-06

## 2021-11-04 MED ORDER — ENALAPRIL MALEATE 5 MG PO TABS
20.0000 mg | ORAL_TABLET | Freq: Every day | ORAL | Status: DC
  Administered 2021-11-05: 20 mg via ORAL
  Filled 2021-11-04: qty 1
  Filled 2021-11-04: qty 4
  Filled 2021-11-04: qty 1

## 2021-11-04 MED ORDER — SENNA 8.6 MG PO TABS
1.0000 | ORAL_TABLET | Freq: Two times a day (BID) | ORAL | Status: DC
  Filled 2021-11-04: qty 1

## 2021-11-04 MED ORDER — HEPARIN (PORCINE) 25000 UT/250ML-% IV SOLN
1100.0000 [IU]/h | INTRAVENOUS | Status: DC
  Administered 2021-11-04 – 2021-11-05 (×2): 1100 [IU]/h via INTRAVENOUS
  Filled 2021-11-04 (×2): qty 250

## 2021-11-04 MED ORDER — ACETAMINOPHEN 325 MG PO TABS: 650.0000 mg | ORAL_TABLET | Freq: Four times a day (QID) | ORAL | Status: DC | PRN

## 2021-11-04 MED ORDER — TAMSULOSIN HCL 0.4 MG PO CAPS: 0.4000 mg | ORAL_CAPSULE | Freq: Every morning | ORAL | Status: DC

## 2021-11-04 MED ORDER — GERHARDT'S BUTT CREAM
TOPICAL_CREAM | Freq: Every day | CUTANEOUS | Status: DC
  Filled 2021-11-04: qty 1

## 2021-11-04 MED ORDER — INSULIN GLARGINE-YFGN 100 UNIT/ML ~~LOC~~ SOLN
10.0000 [IU] | Freq: Every day | SUBCUTANEOUS | Status: DC
  Administered 2021-11-04: 10 [IU] via SUBCUTANEOUS
  Filled 2021-11-04 (×2): qty 0.1

## 2021-11-04 MED ORDER — INSULIN ASPART 100 UNIT/ML IJ SOLN
0.0000 [IU] | Freq: Three times a day (TID) | INTRAMUSCULAR | Status: DC
  Administered 2021-11-04: 2 [IU] via SUBCUTANEOUS

## 2021-11-04 MED ORDER — HEPARIN BOLUS VIA INFUSION
5000.0000 [IU] | Freq: Once | INTRAVENOUS | Status: AC
  Administered 2021-11-04: 5000 [IU] via INTRAVENOUS
  Filled 2021-11-04: qty 5000

## 2021-11-04 MED ORDER — FAMOTIDINE 20 MG PO TABS
20.0000 mg | ORAL_TABLET | Freq: Every day | ORAL | Status: DC
  Administered 2021-11-05: 20 mg via ORAL
  Filled 2021-11-04: qty 1

## 2021-11-04 MED ORDER — PROSIGHT PO TABS
1.0000 | ORAL_TABLET | Freq: Every day | ORAL | Status: DC
  Administered 2021-11-05: 1 via ORAL
  Filled 2021-11-04: qty 1

## 2021-11-04 MED ORDER — LEVOTHYROXINE SODIUM 50 MCG PO TABS
50.0000 ug | ORAL_TABLET | Freq: Every day | ORAL | Status: DC
  Administered 2021-11-05: 50 ug via ORAL
  Filled 2021-11-04: qty 1

## 2021-11-04 NOTE — Progress Notes (Signed)
FPTS Brief Progress Note ? ?S: Patient was resting comfortably on evaluation.  Spoke with charge nurse regarding the patient's CODE STATUS.  The documented CODE STATUS says that she wants everything done for "1 round".  Charge nurse is concerned because this is not a valid order and you will need clarification.  Will discuss with day team tomorrow to further clarify. ? ? ?O: ?BP 136/71 (BP Location: Left Arm)   Pulse 97   Temp 99.3 ?F (37.4 ?C) (Oral)   Resp 17   Ht '5\' 2"'$  (1.575 m)   Wt 66.7 kg   SpO2 94%   BMI 26.90 kg/m?   ? ? ?A/P: ?Continue current plan from daytime team. ? ?Gifford Shave, MD ?11/04/2021, 10:32 PM ?PGY-3, [place] Night Resident  ?Please page [phone] with questions.  ? ? ?

## 2021-11-04 NOTE — Telephone Encounter (Signed)
Called by [facility] in Vascular.  ?Patient has a history of prior DVT with IVC filter in place. Has new edema and pain in right leg. Has large mobile clot from common R femoral that is acute, mobile per technologist. This is extensive.  ?Given symptoms and clot burden, recommend ED evaluation for discussion with Vascular Surgery about options.  ? ?Called and spoke with [name] (daughter) and [name] (daughter with patient). They will take patient to ED promptly. Regulatory affairs officer with hand off. ? ?Dorris Singh, MD  ?Family Medicine Teaching Service  ? ?

## 2021-11-04 NOTE — Hospital Course (Addendum)
Hayley Fisher is a 88 y.o. female who presented with right leg swelling ongoing for months found to have acute DVT of the right lower extremity. PMH is significant for bilateral DVT s/p IVC filter May 2022, wheelchair-bound, urinary retention with chronic Foley catheter, T2DM, HTN, HLD, hypothyroidism, chronic venous insufficiency, anxiety, chronic constipation, GERD. ? ?Acute DVT of the right lower extremity s/p IVC filter May 2022 ?Patient was found to have extensive DVT of right lower extremity involving right common femoral vein, right femoral vein, and right popliteal vein.  Vascular was consulted and they did not recommend surgical intervention given age.  Patient had multiple risk factors for DVT including wheelchair-bound, IVC filter May 2022, recent travel.  Patient was started on heparin drip initially but was transitioned to Eliquis after it was found that she had no overt signs of bleeding.  Repeat CBC was checked and showed hemoglobin to be stable around 9.0-10.0 throughout hospitalization. ? ?Chronic normocytic anemia ?CBC monitored. Hemoglobin stable around 9-10 ? ?T2DM, controlled ?Was initially started on 10 units long-acting insulin but was changed to 5 units Semglee on discharge due to hypoglycemic event.  Sliding scale insulin was discontinued.  Patient's metformin and Rybelsus was held during this hospitalization. ? ?HTN ?Normotensive and was continued on enalapril. ? ?Urinary retention on chronic Foley catheter ?Catheter was switched given that it was due next week. Patients tamsulosin and Myrbetriq was continued.  Patient has outpatient urology following. ? ?Constipation ?Patient has significant bowel incontinence during hospitalization possibly due to not taking oral iron while in hospital so MiraLAX and senna was held on last day of admission. ? ?Hyperlipidemia ?Patient was continued on her home statin.  Patient was noted to be on baby aspirin at home but there was no clear indication for  it at this time as well as patient being on Eliquis so this was discontinued. ? ?Remainder of patient's chronic conditions were monitored and continued on home medications including: Anxiety, hyperlipidemia, hypothyroidism ? ?PCP follow up: ?-Recheck CBC to check hemoglobin ?

## 2021-11-04 NOTE — H&P (Addendum)
Family Medicine Teaching Service ?Hospital Admission History and Physical ?Service Pager: [phone] ? ?Patient name: Hayley Fisher Medical record number: [id number] ?Date of birth: 29-Jul-1931 Age: 88 y.o. Gender: female ? ?Primary Care Provider: [name], MD ?Consultants: VVS ?Code Status: FULL (however only wants 1 attempt at resuscitation) ? ?Chief Complaint: right leg swelling ? ?Assessment and Plan: ?Hayley Fisher is a 88 y.o. female presenting with right leg swelling ongoing for months found to have acute DVT of the right lower extremity. PMH is significant for bilateral DVT s/p IVC filter May 2022, wheelchair-bound, urinary retention with chronic Foley catheter, T2DM, HTN, HLD, hypothyroidism, chronic venous insufficiency, anxiety, chronic constipation, GERD. ? ?Acute DVT of the right lower extremity s/p IVC filter May 2022 ?Patient presenting initially to [facility] clinic with a few months of right lower extremity pain and swelling ultimately found to have acute, extensive DVT of the right lower extremity involving the right  ?common femoral vein, right femoral vein, and right popliteal vein.  This is a provoked DVT given wheelchair-bound status, recent travel, and IVC filter in place which can be thrombogenic.  ED provider discussed case with vascular surgery who did not recommend any surgical intervention and advised anticoagulation if patient and family willing.  She has been started on heparin drip in the ED.  We will plan to observe overnight and possible discharge home tomorrow on DOAC if hemoglobin remained stable. ?- admit to FPTS, med-tele, [place] attending ?- continue heparin drip per pharmacy, likely transition to [facility] tomorrow ?- consider CTPA if becomes hypoxemic or tachycardic ? ?Chronic normocytic anemia ?Hgb 10.6 on admission which appears to be around baseline. No signs of GI bleed. ?- monitor on CBC while starting anticoagulation ?- hold Fe supplement ? ?T2DM, controlled ?Most recent A1c  7.3 one month prior. Followed by endocrinology. ?- continue home Lantus 10 units daily ?- sSSI ?- hold oral agents - metformin, Rybelsus ?- monitor CBG ? ?HTN ?Elevated 160/101 on admission. Consider adjusting regimen if persistently elevated. ?- continue home enalapril 20 mg daily ? ?Urinary retention on chronic Foley catheter ?Followed by urology. Urine studies will be difficult to interpret given chronic Foley catheter. ?- continue Foley catheter ?- continue tamsulosin 0.4 mg daily ?- continue mirabegron 25 mg daily ? ?Constipation ?- continue home Miralax BID ?- continue home senna BID ? ?Anxiety ?- continue duloxetine ? ?HLD ?- continue statin ?- continue ASA (consider discontinuing, appears this is for primary prevention) ? ?Hypothyroidism ?Chronic, stable. Followed by endocrinology. ?- continue levothyroxine  ? ?FEN/GI: carb-modified ?Prophylaxis: heparin gtt full anticoagulation ? ?Disposition: Likely discharge home tomorrow ? ?History of Present Illness:  Hayley Fisher is a 88 y.o. female presenting with right leg swelling. ? ?Patient was initially seen by PCP at [facility] for right lower extremity swelling which daughter states has been ongoing for months.  She was noted to have an elevated D-dimer and was subsequently sent to have a DVT ultrasound which revealed acute, extensive right lower extremity DVT and was advised to come to the ED for further evaluation and possible vascular intervention.  Daughter states that swelling has been present for months and would improve with elevation.  She spends most of her day in her wheelchair and also had recent travel to [place] a few weeks ago during which they were in the car for 6 hours and drove back home to [place] the following day.  She denies chest pain and SOB currently, though she states she sometimes gets short of breath only at night  which lasts 5 to 10 minutes.  Denies melena, blood in stool, vomiting. ? ?In the ED, labs obtained were overall unremarkable except  for mild leukopenia and mild anemia.  ED provider discussed with on-call vascular surgeon who did not recommend any vascular intervention at this time and recommended anticoagulation if patient and family were willing. ? ?She was noted to have bilateral lower extremity DVT during hospitalization in May 2022.  At that time, she was deemed not a candidate for anticoagulation due to acute on chronic anemia and ultimately had IVC filter placed at that time. ? ?Review Of Systems: Per HPI with the following additions:  ? ?Review of Systems  ?Respiratory:  Positive for cough (Chronic, exacerbated when eating or drinking cold things) and shortness of breath. Negative for hemoptysis.   ?Cardiovascular:  Positive for leg swelling. Negative for chest pain.  ?Gastrointestinal:  Negative for blood in stool, melena and vomiting.  ? ?Patient Active Problem List  ? Diagnosis Date Noted  ? DVT (deep venous thrombosis) ([place]) 11/04/2021  ? Anxiety 09/09/2021  ? Goals of care, counseling/discussion 09/09/2021  ? Constipation 08/31/2021  ? Insomnia 08/31/2021  ? Incontinence 07/14/2021  ? Normocytic anemia 06/27/2021  ? Leukocytopenia 06/27/2021  ? Malnutrition of moderate degree 02/23/2021  ? Cognitive impairment 02/21/2021  ? S/P IVC filter 01/16/2021  ? Hypothyroidism 12/09/2020  ? Gastroesophageal reflux disease without esophagitis 09/16/2020  ? Urinary frequency 09/11/2020  ? Unstable gait 04/03/2018  ? Frequent falls 04/03/2018  ? Age-related physical debility 03/22/2018  ? Skin ulcer of sacrum, limited to breakdown of skin ([place]) 03/16/2017  ? Spinal stenosis of lumbar region 01/13/2016  ? Chronic venous insufficiency 11/09/2015  ? DDD (degenerative disc disease), lumbosacral 11/24/2014  ? Constipation by delayed colonic transit 11/24/2014  ? Frequent UTI 10/29/2012  ? Pruritus 03/17/2011  ? Type 2 diabetes mellitus with diabetic neuropathy ([place]) 10/19/2006  ? Hyperlipidemia associated with type 2 diabetes mellitus ([place])  10/19/2006  ? Hypertension associated with diabetes ([place]) 10/19/2006  ? Osteopenia 10/19/2006  ? ? ?Past Medical History: ?Past Medical History:  ?Diagnosis Date  ? Arthritis   ? Arthritis   ? Cataract   ? bil cateracts removed  ? Chronic kidney disease   ? "spot on one of my kidneys" per pt  ? Diabetes mellitus   ? Glaucoma   ? Hyperkalemia 07/02/2020  ? Hyperlipidemia   ? Hyperplastic colon polyp   ? Hypertension   ? Hypothyroidism 12/09/2020  ? Metabolic acidosis 25/95/6387  ? Syncope   ? Thyroid disease   ? ? ?Past Surgical History: ?Past Surgical History:  ?Procedure Laterality Date  ? ABDOMINAL HYSTERECTOMY    ? BLADDER SUSPENSION    ? bladder tacking    ? COLONOSCOPY    ? EYE SURGERY    ? INGUINAL HERNIA REPAIR Right   ? IR IVC FILTER PLMT / S&I /IMG GUID/MOD SED  01/16/2021  ? ? ?Social History: ?Social History  ? ?Tobacco Use  ? Smoking status: Former  ?  Types: Cigarettes  ?  Quit date: 11/29/1980  ?  Years since quitting: 40.9  ? Smokeless tobacco: Never  ?Vaping Use  ? Vaping Use: Never used  ?Substance Use Topics  ? Alcohol use: No  ?  Alcohol/week: 0.0 standard drinks  ? Drug use: No  ? ?Additional social history: Lives with 50 year old sister, daughter and her husband who take care of her.  She also has an aide that comes for 4 hours a day in  the morning, Monday through Saturday.  Former smoker, smoked 0.5 packs/day for about 20 years, quit about 40 years ago.  Denies alcohol use, recreational drug use. ?Please also refer to relevant sections of EMR. ? ?Family History: ?Family History  ?Problem Relation Age of Onset  ? Colon cancer Mother   ? Other Father   ?     cerebral hemorrhage  ? Diabetes Brother   ? Diabetes Sister   ?     x 2  ? Bone cancer Daughter   ? Breast cancer Daughter 73  ? Esophageal cancer Neg Hx   ? Rectal cancer Neg Hx   ? Stomach cancer Neg Hx   ? ? ?Allergies and Medications: ?Allergies  ?Allergen Reactions  ? Tramadol Itching  ?  Takes occasionally with benadryl  ? Levemir [Insulin  Detemir] Itching  ? ?No current facility-administered medications on file prior to encounter.  ? ?Current Outpatient Medications on File Prior to Encounter  ?Medication Sig Dispense Refill  ? aspirin 81 MG c

## 2021-11-04 NOTE — Progress Notes (Signed)
ANTICOAGULATION CONSULT NOTE - Initial Consult ? ?Pharmacy Consult for Heparin infusion ?Indication: DVT ? ?Allergies  ?Allergen Reactions  ? Tramadol Itching  ?  Takes occasionally with benadryl  ? Levemir [Insulin Detemir] Itching  ? ? ?Patient Measurements: ? Weight: 66.8 kg ?Height: 62"  ?Heparin Dosing Weight: 63.9 kg ? ?Vital Signs: ?Temp: 97.7 ?F (36.5 ?C) (03/16 1101) ?Temp Source: Oral (03/16 1101) ?BP: 160/101 (03/16 1101) ?Pulse Rate: 92 (03/16 1101) ? ?Labs: ?Recent Labs  ?  11/04/21 ?1120  ?HGB 10.6*  ?HCT 33.3*  ?PLT 203  ?CREATININE 0.86  ? ? ?Estimated Creatinine Clearance: 40.5 mL/min (by C-G formula based on SCr of 0.86 mg/dL). ? ? ?Medical History: ?Past Medical History:  ?Diagnosis Date  ? Arthritis   ? Arthritis   ? Cataract   ? bil cateracts removed  ? Chronic kidney disease   ? "spot on one of my kidneys" per pt  ? Diabetes mellitus   ? Glaucoma   ? Hyperkalemia 07/02/2020  ? Hyperlipidemia   ? Hyperplastic colon polyp   ? Hypertension   ? Hypothyroidism 12/09/2020  ? Metabolic acidosis 98/33/8250  ? Syncope   ? Thyroid disease   ? ? ?Assessment: ?88 yo F admitted for RLE swelling ongoing for several months and found to have acute DVT of RLE on 11/04/21. Pt has a history of bilateral DVT s/p IVC filter in May 2022. Pt was not taking anticoagulants at home prior to admission. Pharmacy was consulted to initiate and manage heparin infusion for treatment of acute DVT. ? ?H/H/Plt stable (consistent with baseline) ?No s/sx of bleeding noted. ? ? ?Goal of Therapy:  ?Heparin level 0.3-0.7 units/ml ?Monitor platelets by anticoagulation protocol: Yes ?  ?Plan:  ?Give 5000 units bolus x 1 ?Start heparin infusion at 1100 units/hr ?Check heparin level in 8 hours (age > 46) ?Check heparin level and CBC daily.  ?Monitor for s/sx of bleeding. ? ?Kaleen Mask ?11/04/2021,12:01 PM ? ? ?

## 2021-11-04 NOTE — Progress Notes (Signed)
Pt VSS ?CHG bath given ?PeriCare Given ?CCMD called ?Call bell within reach ? ?Pt had a stool but was cleaned up thoroughly ? ?On Heparin Drip ?

## 2021-11-04 NOTE — Progress Notes (Addendum)
VASCULAR LAB ? ? ? ?Bilateral lower extremity venous duplex has been performed. ? ?See CV proc for preliminary results. ? ?Called report to Dr. Owens Shark who requested patient be taken to ED ? ?Jenniah Bhavsar, RVT ?11/04/2021, 10:37 AM ? ?

## 2021-11-04 NOTE — ED Notes (Signed)
Pt's brief soiled with stool. Changed brief and replaced underpad. Pt lunch tray set up on bedside table. Call bell within reach.  ?

## 2021-11-04 NOTE — ED Provider Notes (Signed)
?[place] ?Provider Note ? ? ?CSN: [id number] ?Arrival date & time: 11/04/21  1049 ? ?  ? ?History ? ?Chief Complaint  ?Patient presents with  ? DVT  ? ? ?Hayley Fisher is a 88 y.o. female. ? ?Pt is a 88 yo female with a hx of dm, htn, arthritis, ckd, hypothyroidism, DVT s/p IVC filter, and urinary retention.  Pt went to her doctor's office on 3/14 for leg swelling.  She had an [place] today which showed a large clot in the left femoral vein.  Her pcp told her to come to the ED for further eval. ? ? ?  ? ?Home Medications ?Prior to Admission medications   ?Medication Sig Start Date End Date Taking? Authorizing Provider  ?aspirin 81 MG chewable tablet Chew 81 mg by mouth every morning.   Yes [provider]  ?atorvastatin (LIPITOR) 40 MG tablet TAKE 1 TABLET BY MOUTH EVERY DAY ?Patient taking differently: Take 40 mg by mouth daily. 07/30/21  Yes [name], MD  ?brimonidine (ALPHAGAN) 0.2 % ophthalmic solution Place 1 drop into the left eye 2 (two) times daily. 03/30/21  Yes [provider]  ?carbamide peroxide (DEBROX) 6.5 % OTIC solution Place 2 drops into both ears 2 (two) times daily as needed (ear wax). 01/20/21  Yes Aline August, MD  ?cetirizine (ZYRTEC) 10 MG tablet Take 10 mg by mouth daily as needed for allergies.   Yes [provider]  ?clotrimazole-betamethasone (LOTRISONE) cream Apply 1 application topically 2 (two) times daily as needed (rash/itching). 05/13/21  Yes [provider]  ?dorzolamide-timolol (COSOPT) 22.3-6.8 MG/ML ophthalmic solution Place 1 drop into both eyes 2 (two) times daily. 05/18/20  Yes [provider]  ?DULoxetine (CYMBALTA) 60 MG capsule Take 1 capsule (60 mg total) by mouth daily. 02/25/21  Yes Jennye Boroughs, MD  ?enalapril (VASOTEC) 20 MG tablet Take 1 tablet (20 mg total) by mouth daily. 11/03/21  Yes [name], MD  ?famotidine (PEPCID) 20 MG tablet Take 1 tablet (20 mg total) by mouth daily.  10/06/21 12/05/21 Yes [name], MD  ?ferrous sulfate 325 (65 FE) MG tablet Take 325 mg by mouth daily with breakfast.   Yes [provider]  ?hydrocortisone (ANUSOL-HC) 2.5 % rectal cream Place 1 application rectally 2 (two) times daily as needed for hemorrhoids or anal itching. Please use twice daily x5 days, and then twice daily as needed. 07/02/21  Yes Eugenie Filler, MD  ?insulin glargine (LANTUS SOLOSTAR) 100 UNIT/ML Solostar Pen Inject 5 Units into the skin every morning. ?Patient taking differently: Inject 10 Units into the skin every morning. 09/30/21  Yes Renato Shin, MD  ?ketoconazole (NIZORAL) 2 % cream Apply 1 application topically daily as needed for irritation. 03/11/21  Yes [provider]  ?Latanoprostene Bunod (VYZULTA) 0.024 % SOLN Place 1 drop into both eyes at bedtime.   Yes [provider]  ?levothyroxine (SYNTHROID) 50 MCG tablet Take 1 tablet (50 mcg total) by mouth daily. 11/10/20  Yes Renato Shin, MD  ?metFORMIN (GLUCOPHAGE-XR) 500 MG 24 hr tablet Take 2 tablets (1,000 mg total) by mouth daily with breakfast. 04/06/21  Yes Renato Shin, MD  ?mirabegron ER (MYRBETRIQ) 25 MG TB24 tablet Take 1 tablet (25 mg total) by mouth daily. 11/03/21  Yes [name], MD  ?Multiple Vitamins-Minerals (PRESERVISION AREDS 2+MULTI VIT) CAPS Take 1 capsule by mouth daily.   Yes [provider]  ?ondansetron (ZOFRAN ODT) 4 MG disintegrating tablet Take 1 tablet (4 mg  total) by mouth every 8 (eight) hours as needed for nausea. 09/09/21  Yes [name], MD  ?polyethylene glycol powder (GLYCOLAX/MIRALAX) 17 GM/SCOOP powder Take 17 g by mouth in the morning and at bedtime. 08/20/21  Yes [name], MD  ?Timoteo Ace lotion Apply 1 application topically 4 (four) times daily as needed for itching. 05/27/21  Yes [provider]  ?Semaglutide (RYBELSUS) 3 MG TABS Take 3 mg by mouth daily. 11/09/20  Yes Renato Shin, MD  ?senna (SENOKOT) 8.6 MG TABS tablet  Take 1 tablet (8.6 mg total) by mouth 2 (two) times daily. 08/09/21  Yes Eppie Gibson, MD  ?tamsulosin (FLOMAX) 0.4 MG CAPS capsule Take 0.4 mg by mouth every morning. 10/08/20  Yes [provider]  ?witch hazel-glycerin (TUCKS) pad Apply 1 application topically 2 (two) times daily as needed for itching. 02/25/21  Yes Jennye Boroughs, MD  ?Accu-Chek Softclix Lancets lancets 1 each by Other route See admin instructions. USE TO TEST BLOOD SUGAR UP  TO 3 TIMES A DAY. 02/25/21   Jennye Boroughs, MD  ?Catheter Self-Adhesive Urinary MISC 1 patch by Does not apply route daily. 10/26/20   Richarda Osmond, MD  ?Ralene Bathe EASE INJECT PEN NEEDLES 32G X 4 MM MISC USE 1 SYRINGE DAILY TO INJECT VICTOZA 09/13/21   Renato Shin, MD  ?glucose blood (ACCU-CHEK AVIVA PLUS) test strip 1 each by Other route See admin instructions. USE TO TEST BLOOD SUGAR UP  TO 3 TIMES A DAY. 02/25/21   Jennye Boroughs, MD  ?Insulin Syringe-Needle U-100 (SURE COMFORT INSULIN SYRINGE) 31G X 5/16" 0.3 ML MISC Inject 1 Syringe into the skin daily. 02/25/21   Jennye Boroughs, MD  ?Misc. Devices (TRANSFER BOARD) MISC Use as needed for transfer from bed to chair or vice versa 02/25/21   Jennye Boroughs, MD  ?   ? ?Allergies    ?Tramadol and Levemir [insulin detemir]   ? ?Review of Systems   ?Review of Systems  ?Musculoskeletal:   ?     Right leg pain  ?All other systems reviewed and are negative. ? ?Physical Exam ?Updated Vital Signs ?BP (!) 160/101 (BP Location: Right Arm)   Pulse 92   Temp 97.7 ?F (36.5 ?C) (Oral)   Resp 20   SpO2 98%  ?Physical Exam ?Vitals and nursing note reviewed.  ?Constitutional:   ?   Appearance: Normal appearance.  ?HENT:  ?   Head: Normocephalic and atraumatic.  ?   Right Ear: External ear normal.  ?   Left Ear: External ear normal.  ?   Nose: Nose normal.  ?   Mouth/Throat:  ?   Mouth: Mucous membranes are moist.  ?   Pharynx: Oropharynx is clear.  ?Eyes:  ?   Extraocular Movements: Extraocular movements intact.  ?    Conjunctiva/sclera: Conjunctivae normal.  ?   Pupils: Pupils are equal, round, and reactive to light.  ?Cardiovascular:  ?   Rate and Rhythm: Normal rate and regular rhythm.  ?   Pulses: Normal pulses.  ?   Heart sounds: Normal heart sounds.  ?Pulmonary:  ?   Effort: Pulmonary effort is normal.  ?   Breath sounds: Normal breath sounds.  ?Abdominal:  ?   General: Abdomen is flat. Bowel sounds are normal.  ?   Palpations: Abdomen is soft.  ?Musculoskeletal:     ?   General: Swelling present.  ?   Cervical back: Normal range of motion and neck supple.  ?Skin: ?   General: Skin  is warm.  ?   Capillary Refill: Capillary refill takes less than 2 seconds.  ?Neurological:  ?   General: No focal deficit present.  ?   Mental Status: She is alert and oriented to person, place, and time.  ?Psychiatric:     ?   Mood and Affect: Mood normal.     ?   Behavior: Behavior normal.  ? ? ?ED Results / Procedures / Treatments   ?Labs ?(all labs ordered are listed, but only abnormal results are displayed) ?Labs Reviewed  ?BASIC METABOLIC PANEL - Abnormal; Notable for the following components:  ?    Result Value  ? Glucose, Bld 281 (*)   ? Calcium 8.7 (*)   ? All other components within normal limits  ?CBC WITH DIFFERENTIAL/PLATELET - Abnormal; Notable for the following components:  ? WBC 2.9 (*)   ? RBC 3.43 (*)   ? Hemoglobin 10.6 (*)   ? HCT 33.3 (*)   ? Lymphs Abs 0.6 (*)   ? All other components within normal limits  ?URINE CULTURE  ?URINALYSIS, ROUTINE W REFLEX MICROSCOPIC  ? ? ?EKG ?None ? ?Radiology ?VAS [place] LOWER EXTREMITY VENOUS (DVT) ? ?Result Date: 11/04/2021 ? Lower Venous DVT Study Patient Name:  Hayley Fisher  Date of Exam:   11/04/2021 Medical Rec #: [id number]       Accession #:    [id number] Date of Birth: 04/02/1931       Patient Gender: F Patient Age:   46 years Exam Location:  [facility] Procedure:      VAS [place] LOWER EXTREMITY VENOUS (DVT) Referring Phys: MARGARET PRAY  --------------------------------------------------------------------------------  Indications: Swelling, and Elevated D-Dimer.  Risk Factors: DVT Age indeterminate right profunda and left mid femoral veins 01/15/21 IVC filter, not anticoagulated secondary to chroni

## 2021-11-04 NOTE — ED Triage Notes (Signed)
Patient sent from ultrasound after finding DVT in right leg. Patient states she has had right leg numbness for several weeks, denies pain, reports intermittent shortness of breath that occurs at night. Patient is alert,oriented and in no apparent distress. Patient arrives with indwelling urinary catheter. ?

## 2021-11-04 NOTE — Progress Notes (Signed)
PT would like to get a new Foley bag while she is here in the hospital to avoid going on Tuesday for her outpatient appointment for her weekly foley bag ? ?Pt had another incontinent bowel movement ?Contamination to the foley was noted ? ?Patient again cleaned thoroughly ?

## 2021-11-04 NOTE — Progress Notes (Signed)
ANTICOAGULATION CONSULT NOTE - Follow Up Consult ? ?Pharmacy Consult for IV Heparin ?Indication: DVT ? ?Allergies  ?Allergen Reactions  ? Tramadol Itching  ?  Takes occasionally with benadryl  ? Levemir [Insulin Detemir] Itching  ? ? ?Patient Measurements: ?Height: '5\' 2"'$  (157.5 cm) ?Weight: 66.7 kg (147 lb 0.8 oz) ?IBW/kg (Calculated) : 50.1 ?Heparin Dosing Weight: 63.8 kg ? ?Vital Signs: ?Temp: 98.7 ?F (37.1 ?C) (03/16 1824) ?Temp Source: Oral (03/16 1824) ?BP: 133/79 (03/16 1824) ?Pulse Rate: 97 (03/16 1824) ? ?Labs: ?Recent Labs  ?  11/04/21 ?1120 11/04/21 ?1928  ?HGB 10.6*  --   ?HCT 33.3*  --   ?PLT 203  --   ?HEPARINUNFRC  --  0.69  ?CREATININE 0.86  --   ? ? ?Estimated Creatinine Clearance: 40.5 mL/min (by C-G formula based on SCr of 0.86 mg/dL). ? ? ?Medications:  ?Infusions:  ? heparin 1,100 Units/hr (11/04/21 1236)  ? ? ?Assessment: ?88 years of age female on IV Heparin for RLE DVT. Patient has a history of bilateral DVT s/p IVC filter in May 2022. ? ?Initial heparin level is therapeutic at 0.69 at the upper end of goal (may be bolus effect from prior bolus given). No signs or symptoms of bleeding.  ? ?Goal of Therapy:  ?Heparin level 0.3-0.7 units/ml ?Monitor platelets by anticoagulation protocol: Yes ?  ?Plan:  ?Continue IV Heparin 1100 units/hr.  ?Recheck Heparin level in AM (~8 hrs) and CBC ?Monitor for any bleeding ? ?Sloan Leiter, PharmD, BCPS, BCCCP ?Clinical Pharmacist ?Please refer to [facility] [place] for [place] numbers ?11/04/2021,8:15 PM ? ? ?

## 2021-11-05 ENCOUNTER — Other Ambulatory Visit (HOSPITAL_COMMUNITY): Payer: Self-pay

## 2021-11-05 DIAGNOSIS — I82411 Acute embolism and thrombosis of right femoral vein: Secondary | ICD-10-CM | POA: Diagnosis not present

## 2021-11-05 DIAGNOSIS — L8990 Pressure ulcer of unspecified site, unspecified stage: Secondary | ICD-10-CM | POA: Insufficient documentation

## 2021-11-05 LAB — GLUCOSE, CAPILLARY
Glucose-Capillary: 47 mg/dL — ABNORMAL LOW (ref 70–99)
Glucose-Capillary: 91 mg/dL (ref 70–99)
Glucose-Capillary: 151 mg/dL — ABNORMAL HIGH (ref 70–99)
Glucose-Capillary: 186 mg/dL — ABNORMAL HIGH (ref 70–99)

## 2021-11-05 LAB — CBC
HCT: 27.8 % — ABNORMAL LOW (ref 36.0–46.0)
HCT: 30.1 % — ABNORMAL LOW (ref 36.0–46.0)
Hemoglobin: 9 g/dL — ABNORMAL LOW (ref 12.0–15.0)
Hemoglobin: 9.9 g/dL — ABNORMAL LOW (ref 12.0–15.0)
MCH: 31 pg (ref 26.0–34.0)
MCH: 31.5 pg (ref 26.0–34.0)
MCHC: 32.4 g/dL (ref 30.0–36.0)
MCHC: 32.9 g/dL (ref 30.0–36.0)
MCV: 95.9 fL (ref 80.0–100.0)
MCV: 95.9 fL (ref 80.0–100.0)
Platelets: 179 10*3/uL (ref 150–400)
Platelets: 190 10*3/uL (ref 150–400)
RBC: 2.9 MIL/uL — ABNORMAL LOW (ref 3.87–5.11)
RBC: 3.14 MIL/uL — ABNORMAL LOW (ref 3.87–5.11)
RDW: 13.2 % (ref 11.5–15.5)
RDW: 13.2 % (ref 11.5–15.5)
WBC: 2.7 10*3/uL — ABNORMAL LOW (ref 4.0–10.5)
WBC: 3 10*3/uL — ABNORMAL LOW (ref 4.0–10.5)
nRBC: 0 % (ref 0.0–0.2)
nRBC: 0 % (ref 0.0–0.2)

## 2021-11-05 LAB — COMPREHENSIVE METABOLIC PANEL
ALT: 12 U/L (ref 0–44)
AST: 16 U/L (ref 15–41)
Albumin: 3 g/dL — ABNORMAL LOW (ref 3.5–5.0)
Alkaline Phosphatase: 37 U/L — ABNORMAL LOW (ref 38–126)
Anion gap: 9 (ref 5–15)
BUN: 17 mg/dL (ref 8–23)
CO2: 28 mmol/L (ref 22–32)
Calcium: 8.7 mg/dL — ABNORMAL LOW (ref 8.9–10.3)
Chloride: 102 mmol/L (ref 98–111)
Creatinine, Ser: 0.86 mg/dL (ref 0.44–1.00)
GFR, Estimated: >60 mL/min (ref >60)
Glucose, Bld: 164 mg/dL — ABNORMAL HIGH (ref 70–99)
Potassium: 4 mmol/L (ref 3.5–5.1)
Sodium: 139 mmol/L (ref 135–145)
Total Bilirubin: 0.4 mg/dL (ref 0.3–1.2)
Total Protein: 5.6 g/dL — ABNORMAL LOW (ref 6.5–8.1)

## 2021-11-05 LAB — HEPARIN LEVEL (UNFRACTIONATED): Heparin Unfractionated: 0.57 IU/mL (ref 0.30–0.70)

## 2021-11-05 MED ORDER — LANTUS SOLOSTAR 100 UNIT/ML ~~LOC~~ SOPN: 5.0000 [IU] | PEN_INJECTOR | SUBCUTANEOUS | 1 refills | Status: DC

## 2021-11-05 MED ORDER — APIXABAN (ELIQUIS) VTE STARTER PACK (10MG AND 5MG)
ORAL_TABLET | ORAL | 0 refills | Status: DC
  Filled 2021-11-05: qty 74, 30d supply, fill #0

## 2021-11-05 MED ORDER — APIXABAN 5 MG PO TABS: 5.0000 mg | ORAL_TABLET | Freq: Two times a day (BID) | ORAL | Status: DC

## 2021-11-05 MED ORDER — APIXABAN 5 MG PO TABS
10.0000 mg | ORAL_TABLET | Freq: Two times a day (BID) | ORAL | Status: DC
  Administered 2021-11-05: 10 mg via ORAL
  Filled 2021-11-05: qty 2

## 2021-11-05 MED ORDER — PSYLLIUM 95 % PO PACK
1.0000 | PACK | Freq: Every day | ORAL | Status: DC
  Filled 2021-11-05: qty 1

## 2021-11-05 MED ORDER — INSULIN GLARGINE-YFGN 100 UNIT/ML ~~LOC~~ SOLN
5.0000 [IU] | Freq: Every day | SUBCUTANEOUS | Status: DC
  Administered 2021-11-05: 5 [IU] via SUBCUTANEOUS
  Filled 2021-11-05: qty 0.05

## 2021-11-05 NOTE — TOC Transition Note (Signed)
Transition of Care (TOC) - CM/SW Discharge Note ? ? ?Patient Details  ?Name: Hayley Fisher ?MRN: [id number] ?Date of Birth: [birth year] ? ?Transition of Care (TOC) CM/SW Contact:  ?Verdell Carmine, RN ?Phone Number: ?11/05/2021, 4:28 PM ? ? ?Clinical Narrative:    ? ?Spoke with daughter [name] again to let her know of mother waffling on wheelchair and walker. She states she will pay for any out of pocket costs. Sent message to [employer]. Order in for wheelchair and walker. She is active with [facility] for home health and they will follow ? ?Final next level of care: [place] ?Barriers to Discharge: Transportation ? ? ?Patient Goals and CMS Choice ?  ?  ?  ? ?Discharge Placement ?  ?           ? Home with Home Health PT ?  ?  ?  ? ?Discharge Plan and Services ?  ?Discharge Planning Services: CM Consult ?Post Acute Care Choice: Home Health          ?DME Arranged: Wheelchair manual ?DME Agency: [employer] ?Date DME Agency Contacted: 11/05/21 ?Time DME Agency Contacted: 1572 ?Representative spoke with at DME Agency: Freda Munro ?HH Arranged: PT ?[place] Agency: [place] ?Date HH Agency Contacted: 11/05/21 ?Time [place]: 1628 ?Representative spoke with at [place]: [place] ? ?Social Determinants of Health (SDOH) Interventions ?  ? ? ?Readmission Risk Interventions ?Readmission Risk Prevention Plan 07/05/2020  ?Transportation Screening Complete  ?Home Care Screening Complete  ?Medication Review (RN CM) Complete  ?Some recent data might be hidden  ? ? ? ? ? ?

## 2021-11-05 NOTE — Progress Notes (Signed)
ANTICOAGULATION CONSULT NOTE - Follow Up Consult ? ?Pharmacy Consult for IV Heparin ?Indication: DVT ? ?Allergies  ?Allergen Reactions  ? Tramadol Itching  ?  Takes occasionally with benadryl  ? Levemir [Insulin Detemir] Itching  ? ? ?Patient Measurements: ?Height: '5\' 2"'$  (157.5 cm) ?Weight: 66.7 kg (147 lb 0.8 oz) ?IBW/kg (Calculated) : 50.1 ?Heparin Dosing Weight: 63.8 kg ? ?Vital Signs: ?Temp: 98.8 ?F (37.1 ?C) (03/17 0526) ?Temp Source: Oral (03/17 0526) ?BP: 132/83 (03/17 0526) ?Pulse Rate: 81 (03/17 0526) ? ?Labs: ?Recent Labs  ?  11/04/21 ?1120 11/04/21 ?1928 11/05/21 ?0127  ?HGB 10.6*  --  9.0*  ?HCT 33.3*  --  27.8*  ?PLT 203  --  179  ?HEPARINUNFRC  --  0.69 0.57  ?CREATININE 0.86  --  0.86  ? ? ? ?Estimated Creatinine Clearance: 40.5 mL/min (by C-G formula based on SCr of 0.86 mg/dL). ? ? ?Medications:  ?Infusions:  ? heparin 1,100 Units/hr (11/04/21 1236)  ? ? ?Assessment: ?88 years of age female on IV Heparin for RLE DVT. Patient has a history of bilateral DVT s/p IVC filter in May 2022. ? ?Heparin level therapeutic x2 at 0.57 3/17 AM with heparin running at 1,100 units/hour. Slight Hgb drop from 10.6>9, PLT 203>179. No signs or symptoms of bleeding or issues with infusion per RN.  ? ?Goal of Therapy:  ?Heparin level 0.3-0.7 units/ml ?Monitor platelets by anticoagulation protocol: Yes ?  ?Plan:  ?Continue IV Heparin 1100 units/hr.  ?Daily heparin level and CBC ?Monitor for any bleeding ?F/U plans for long-term anticoag  ? ?Adria Dill, PharmD ?PGY-1 Acute Care Resident  ?11/05/2021 7:55 AM  ? ? ? ?

## 2021-11-05 NOTE — TOC Benefit Eligibility Note (Signed)
Patient Advocate Encounter ?  ?Insurance verification completed.   ?  ?The patient is currently admitted and upon discharge could be taking XARELTO. ?  ?The current 30 day co-pay is, $0.  ? ?The patient is currently admitted and upon discharge could be taking ELIQUIS. ?  ?The current 30 day co-pay is, $0.  ? ?The patient is insured through [employer]. ? ? ?  ? ?

## 2021-11-05 NOTE — Progress Notes (Signed)
Family Medicine Teaching Service ?Daily Progress Note ?Intern Pager: [phone] ? ?Patient name: Hayley Fisher Medical record number: [id number] ?Date of birth: 1931/06/19 Age: 88 y.o. Gender: female ? ?Primary Care Provider: [name], MD ?Consultants: VVS ?Code Status: Full (1 ROSC) ? ?Pt Overview and Major Events to Date:  ?11/05/21-Admitted ? ?Assessment and Plan: ?Hayley Fisher is a 88 y.o. female presenting with right leg swelling ongoing for months found to have acute DVT of the right lower extremity. PMH is significant for bilateral DVT s/p IVC filter May 2022, wheelchair-bound, urinary retention with chronic Foley catheter, T2DM, HTN, HLD, hypothyroidism, chronic venous insufficiency, anxiety, chronic constipation, GERD. ? ?Acute DVT of the right lower extremity s/p IVC filter May 2022 ?Provoked DVT likely 2/2 IVC filter, recent travel, wheelchair bound. VVS recommends no surgical intervention and advised anticoagulation.  ?-Transition to Eliquis 10 BID x1 week then 5 BID ? ?Chronic normocytic anemia ?Hgb 10.6 on admission which appears to be around baseline. No signs of GI bleed. ?- CBC this afternoon ?- hold Fe supplement ?  ?T2DM, controlled ?Most recent A1c 7.3 one month prior. Followed by endocrinology. ?Supposed to be on 5 units long acting ?- Change to 5 units semglee ?- d/c sSSI ?- hold oral agents: metformin, Rybelsus ?- monitor CBG ?  ?HTN ?Normotensive SBP 130s.  ?- continue home enalapril 20 mg daily ?  ?Urinary retention on chronic Foley catheter ?Followed by urology. Urine studies will be difficult to interpret given chronic Foley catheter. ?- continue Foley catheter ?- continue tamsulosin 0.4 mg daily ?- continue mirabegron 25 mg daily ?  ?Constipation ?Had incontinent BM yesterday ?-Holding miralax and senna ?  ?Anxiety ?- continue duloxetine ?  ?HLD ?- continue statin ?- continue ASA (consider discontinuing, appears this is for primary prevention) ?  ?Hypothyroidism ?Chronic, stable.  Followed by endocrinology. ?- continue levothyroxine  ?  ?FEN/GI: carb-modified ?Prophylaxis: heparin gtt full anticoagulation ? ?Subjective:  ?Patient seen at bedside. No acute complaints. ? ?Objective: ?Temp:  [97.7 ?F (36.5 ?C)-99.4 ?F (37.4 ?C)] 98.8 ?F (37.1 ?C) (03/17 0526) ?Pulse Rate:  [81-99] 81 (03/17 0526) ?Resp:  [15-20] 15 (03/17 0526) ?BP: (132-160)/(71-101) 132/83 (03/17 0526) ?SpO2:  [94 %-98 %] 97 % (03/17 0526) ?Weight:  [147 lb 0.8 oz (66.7 kg)] 147 lb 0.8 oz (66.7 kg) (03/16 1824) ?Physical Exam: ?General: No acute distress at bedside  ?Cardiovascular: RRR ?Respiratory: CTAB ?Abdomen: Soft, nontender ?Extremities: Moving all extremities ? ?Laboratory: ?Recent Labs  ?Lab 11/04/21 ?1120 11/05/21 ?0127  ?WBC 2.9* 2.7*  ?HGB 10.6* 9.0*  ?HCT 33.3* 27.8*  ?PLT 203 179  ? ?Recent Labs  ?Lab 11/04/21 ?1120 11/05/21 ?0127  ?NA 136 139  ?K 4.4 4.0  ?CL 99 102  ?CO2 27 28  ?BUN 15 17  ?CREATININE 0.86 0.86  ?CALCIUM 8.7* 8.7*  ?PROT  --  5.6*  ?BILITOT  --  0.4  ?ALKPHOS  --  37*  ?ALT  --  12  ?AST  --  16  ?GLUCOSE 281* 164*  ? ? ?Imaging/Diagnostic Tests: ?No new.   ? ?France Ravens, MD ?11/05/2021, 7:30 AM ?PGY-1, [place] [facility] Intern pager: [phone], text pages welcome ? ?

## 2021-11-05 NOTE — Discharge Instructions (Addendum)
Medicare Outpatient Observation Notice ?  ?Patient name:  Hayley Fisher Patient number:  [id number]  ?                                                                                                                                                                     ?You?re a hospital outpatient receiving observation services. You are not an inpatient because: ? ?  ?DVT ?  ?You require hospital care for evaluation and/or treatment.  It is expected you will need hospital care for less than a total of two days.  ?                                                                                                                                                                     ?  ?Being an outpatient may affect what you pay in a hospital: ?  ?When you?re a hospital outpatient, your observation stay is covered under Medicare Part B. ?  ?For Part B services, you generally pay: ?  ?A copayment for each outpatient hospital service you get. Part B copayments may vary by type of service. ?  ?20% of the Medicare-approved amount for most doctor services, after the Part B deductible. ?  ?Observation services may affect coverage and payment of your care after you leave the hospital: ? ?  ? ?If you need skilled nursing facility (SNF) care after you leave the hospital, Medicare Part A will only cover SNF care if you?ve had a 3-day minimum, medically necessary, inpatient hospital stay for a related illness or injury. An inpatient hospital stay begins the day the hospital admits you as an inpatient based on a doctor?s order and doesn?t include the day you?re discharged. ?  ?If you have Medicaid, a Medicare Advantage plan or other health plan, Medicaid or the plan may have different rules for SNF coverage after you leave the hospital. Check with Medicaid or your plan. ?  ?NOTE: Medicare Part A generally doesn?t cover outpatient hospital services, like an  observation stay. However, Part A will generally cover medically necessary  inpatient services if the hospital admits you as an inpatient based on a doctor?s order. In most cases, you?ll pay a one-time deductible for all of your inpatient hospital services for the first 60 days you?re in a hospital. ?                                                                                                                                                                     ?If you have any questions about your observation services, ask the hospital staff member giving you this notice or the doctor providing your hospital care. You can also ask to speak with someone from the hospital?s utilization or discharge planning department. ?  ?You can also call 1-800-MEDICARE (1-3618163164).  [employer] users should call ([phone]. ?  ?Form CMS [id number]   Expiration 08/21/2021 OMB APPROVAL [id number]  ?  ?  ? ?  ? ?Your costs for medications: ? ?  ? ?Generally, prescription and over-the-counter drugs, including ?self-administered drugs,? you get in a hospital outpatient setting (like an emergency department) aren?t covered by Part B. ?Self- administered drugs? are drugs you?d normally take on your own. For safety reasons, many hospitals don?t allow you to take medications brought from home. If you have a Medicare prescription drug plan (Part D), your plan may help you pay for these drugs. You?ll likely need to pay out-of- pocket for these drugs and submit a claim to your drug plan for a refund. Contact your drug plan for more information. ?  ?                                                                                                                                                                     ?If you?re enrolled in a Medicare Advantage plan (like an HMO or PPO) or other Medicare health plan (Part C), your costs and coverage may be different. Check with your plan to find out about coverage for outpatient  observation services. ? ?  ?If you?re a Chief of Staff through your state  Medicaid program, you can?t be billed for Part A or Part B deductibles, coinsurance, and copayments. ? ?                                                                                                                                                                    ?Additional Information (Optional): ?  ?  ?  ?  ?  ?                                                                                                                                                                     ?Please sign below to show you received and understand this notice. ? ?  ? ?                                    Date: 11/05/21 / Time:1:22 PM ?  ?CMS does not discriminate in its programs and activities. To request this publication in alternative format, please call: 1-800-MEDICARE or email:[email or web address]. ?  ?Form CMS [id number]   Expiration 08/21/2021 OMB APPROVAL [id number]  ?  ? ? ?Patient ? ? ?Add ?No image attached ?Trace ?Slow ?Corrupt ?Edit Data ?Change Template ?Print ?On  ? ?Information on my medicine - ELIQUIS? (apixaban) ? ?Why was Eliquis? prescribed for you? ?Eliquis? was prescribed to treat blood clots that may have been found in the veins of your legs (deep vein thrombosis) or in your lungs (pulmonary embolism) and to reduce the risk of them occurring again. ? ?What do You need to know about Eliquis? ? ?The starting dose is 10 mg (two 5 mg tablets) taken TWICE daily for the FIRST SEVEN (7) DAYS, then on (enter date)  11/13/2021  the dose is reduced to ONE 5 mg tablet taken TWICE daily.  Eliquis? may be taken with or without food.  ? ?Try to take  the dose about the same time in the morning and in the evening. If you have difficulty swallowing the tablet whole please discuss with your pharmacist how to take the medication safely. ? ?Take Eliquis? exactly as prescribed and DO NOT stop taking Eliquis? without talking to the doctor who prescribed the medication.  Stopping may increase your risk of developing a new  blood clot.  Refill your prescription before you run out. ? ?After discharge, you should have regular check-up appointments with your healthcare provider that is prescribing your Eliquis?. ?   ?What do you do if you miss a dose? ?If a dose of ELIQUIS? is not taken at the scheduled time, take it as soon as possible on the same day and twice-daily administration should be resumed. The dose should not be doubled to make up for a missed dose. ? ?Important Safety Information ?A possible side effect of Eliquis? is bleeding. You should call your healthcare provider right away if you experience any of the following: ?Bleeding from an injury or your nose that does not stop. ?Unusual colored urine (red or dark brown) or unusual colored stools (red or black). ?Unusual bruising for unknown reasons. ?A serious fall or if you hit your head (even if there is no bleeding). ? ?Some medicines may interact with Eliquis? and might increase your risk of bleeding or clotting while on Eliquis?Marland Kitchen To help avoid this, consult your healthcare provider or pharmacist prior to using any new prescription or non-prescription medications, including herbals, vitamins, non-steroidal anti-inflammatory drugs (NSAIDs) and supplements. ? ?This website has more information on Eliquis? (apixaban): http://www.eliquis.com/eliquis/home  ? ? ? ?Dear [name], ? ?It was a pleasure taking care of you while you are in the hospital.  You were admitted due to swelling in the legs.  You were found to have a clot.  We have transitioned your blood thinner to an oral form called Eliquis.  Please continue to take all your other medications as prescribed.  Please follow-up with your primary care provider in a week.  If your condition continues to worsen or you notice any changes please contact your PCP. ? ?Be sure to only take 5 units of insulin daily.  Your endocrinologist wanted this as well. You were given 5u during your hospital admission.  ? ?Take care! ?-Zacarias Pontes  Family Practice Teaching Service ?

## 2021-11-05 NOTE — Care Management Obs Status (Signed)
MEDICARE OBSERVATION STATUS NOTIFICATION ? ? ?Patient Details  ?Name: Hayley Fisher ?MRN: [id number] ?Date of Birth: 04-04-1931 ? ? ?Medicare Observation Status Notification Given:  Yes ? ? ? ?Verdell Carmine, RN ?11/05/2021, 1:23 PM ?

## 2021-11-05 NOTE — Progress Notes (Signed)
Hypoglycemic Event ? ?CBG: 47 ? ?Treatment: 8 oz juice/soda ? ?Symptoms: Hungry and body feeling warm ? ?Follow-up CBG: TTCN:[id number] CBG Result:91 ? ?Possible Reasons for Event: patient had not eaten  ?Inadequate meal intake ? ?Comments/MD notified: patient felt better once she drank juice  ? ? ? ?Hayley Fisher ? ? ?

## 2021-11-05 NOTE — Progress Notes (Signed)
Pt discharged to home with family.  Pt taken off telemetry and CCMD notified.  Pt's IV removed.  Pt left with all of their personal belongings and TOC Eliquis starter pack.  AVS documentation reviewed and sent home with Pt and all questions answered.   ?

## 2021-11-05 NOTE — Care Management (Signed)
?  ?  Durable Medical Equipment  ?(From admission, onward)  ?  ? ? ?  ? ?  Start     Ordered  ? 11/05/21 1635  For home use only DME standard manual wheelchair with seat cushion  Once       ?Comments: Patient suffers from Weakness generalized  which impairs their ability to perform daily activities like toileting in the home.  A walker will not resolve issue with performing activities of daily living. A wheelchair will allow patient to safely perform daily activities. Patient can safely propel the wheelchair in the home or has a caregiver who can provide assistance. Length of need Lifetime. ?Accessories: elevating leg rests (ELRs), wheel locks, extensions and anti-tippers.  ? 11/05/21 1635  ? 11/05/21 1318  For home use only DME 4 wheeled rolling walker with seat  Once       ?Question:  Patient needs a walker to treat with the following condition  Answer:  Weakness  ? 11/05/21 1320  ? ?  ?  ? ?  ?  ?

## 2021-11-05 NOTE — Progress Notes (Signed)
New urinary catheter placed to replace old catheter for discharge per MD order.   ?

## 2021-11-05 NOTE — Discharge Summary (Addendum)
Family Medicine Teaching Service ?Hospital Discharge Summary ? ?Patient name: Hayley Fisher Medical record number: [id number] ?Date of birth: 05-31-1931 Age: 88 y.o. Gender: female ?Date of Admission: 11/04/2021  Date of Discharge: 11/05/21 ?Admitting Physician: Zola Button, MD ? ?Primary Care Provider: [name], MD ?Consultants: Vascular surgery ? ?Indication for Hospitalization: DVT ? ?Discharge Diagnoses/Problem List:  ?Acute provoked DVT of right lower extremity ?Chronic normocytic anemia ?Type 2 diabetes ?Hypertension ?Urinary retention on chronic Foley ?Constipation ?Anxiety ?Hyperlipidemia ?Hypothyroidism ? ?Disposition: Home ? ?Discharge Condition: Stable ? ?Discharge Exam:  ?General: No acute distress at bedside  ?Cardiovascular: RRR ?Respiratory: CTAB ?Abdomen: Soft, nontender ?Extremities: Moving all extremities ? ?Brief Hospital Course:  ?Hayley Fisher is a 88 y.o. female who presented with right leg swelling ongoing for months found to have acute DVT of the right lower extremity. PMH is significant for bilateral DVT s/p IVC filter May 2022, wheelchair-bound, urinary retention with chronic Foley catheter, T2DM, HTN, HLD, hypothyroidism, chronic venous insufficiency, anxiety, chronic constipation, GERD. ? ?Acute DVT of the right lower extremity s/p IVC filter May 2022 ?Patient was found to have extensive DVT of right lower extremity involving right common femoral vein, right femoral vein, and right popliteal vein.  Vascular was consulted and they did not recommend surgical intervention given age.  Patient had multiple risk factors for DVT including wheelchair-bound, IVC filter May 2022, recent travel.  Patient was started on heparin drip initially but was transitioned to Eliquis after it was found that she had no overt signs of bleeding.  Repeat CBC was checked and showed hemoglobin to be stable around 9.0-10.0 throughout hospitalization. ? ?Chronic normocytic anemia ?CBC monitored. Hemoglobin  stable around 9-10 ? ?T2DM, controlled ?Was initially started on 10 units long-acting insulin but was changed to 5 units Semglee on discharge due to hypoglycemic event.  Sliding scale insulin was discontinued.  Patient's metformin and Rybelsus was held during this hospitalization. ? ?HTN ?Normotensive and was continued on enalapril. ? ?Urinary retention on chronic Foley catheter ?Catheter was switched given that it was due next week. Patients tamsulosin and Myrbetriq was continued.  Patient has outpatient urology following. ? ?Constipation ?Patient has significant bowel incontinence during hospitalization possibly due to not taking oral iron while in hospital so MiraLAX and senna was held on last day of admission. ? ?Hyperlipidemia ?Patient was continued on her home statin.  Patient was noted to be on baby aspirin at home but there was no clear indication for it at this time as well as patient being on Eliquis so this was discontinued. ? ?Remainder of patient's chronic conditions were monitored and continued on home medications including: Anxiety, hyperlipidemia, hypothyroidism ? ?ASA d/c ? ? ? ?Significant Procedures: None ? ?Significant Labs and Imaging:  ?Recent Labs  ?Lab 11/04/21 ?1120 11/05/21 ?0127 11/05/21 ?1552  ?WBC 2.9* 2.7* 3.0*  ?HGB 10.6* 9.0* 9.9*  ?HCT 33.3* 27.8* 30.1*  ?PLT 203 179 190  ? ?Recent Labs  ?Lab 11/04/21 ?1120 11/05/21 ?0127  ?NA 136 139  ?K 4.4 4.0  ?CL 99 102  ?CO2 27 28  ?GLUCOSE 281* 164*  ?BUN 15 17  ?CREATININE 0.86 0.86  ?CALCIUM 8.7* 8.7*  ?ALKPHOS  --  37*  ?AST  --  16  ?ALT  --  12  ?ALBUMIN  --  3.0*  ? ? ? ? ?Results/Tests Pending at Time of Discharge: none ? ?Discharge Medications:  ?Allergies as of 11/05/2021   ? ?   Reactions  ? Tramadol Itching  ? Takes occasionally with  benadryl  ? Levemir [insulin Detemir] Itching  ? ?  ? ?  ?Medication List  ?  ? ?STOP taking these medications   ? ?aspirin 81 MG chewable tablet ?  ? ?  ? ?TAKE these medications   ? ?Accu-Chek Aviva  Plus test strip ?Generic drug: glucose blood ?1 each by Other route See admin instructions. USE TO TEST BLOOD SUGAR UP  TO 3 TIMES A DAY. ?  ?Accu-Chek Softclix Lancets lancets ?1 each by Other route See admin instructions. USE TO TEST BLOOD SUGAR UP  TO 3 TIMES A DAY. ?  ?atorvastatin 40 MG tablet ?Commonly known as: LIPITOR ?TAKE 1 TABLET BY MOUTH EVERY DAY ?  ?brimonidine 0.2 % ophthalmic solution ?Commonly known as: ALPHAGAN ?Place 1 drop into the left eye 2 (two) times daily. ?  ?Catheter Self-Adhesive Urinary Misc ?1 patch by Does not apply route daily. ?  ?cetirizine 10 MG tablet ?Commonly known as: ZYRTEC ?Take 10 mg by mouth daily as needed for allergies. ?  ?clotrimazole-betamethasone cream ?Commonly known as: LOTRISONE ?Apply 1 application topically 2 (two) times daily as needed (rash/itching). ?  ?Debrox 6.5 % OTIC solution ?Generic drug: carbamide peroxide ?Place 2 drops into both ears 2 (two) times daily as needed (ear wax). ?  ?dorzolamide-timolol 22.3-6.8 MG/ML ophthalmic solution ?Commonly known as: COSOPT ?Place 1 drop into both eyes 2 (two) times daily. ?  ?DULoxetine 60 MG capsule ?Commonly known as: CYMBALTA ?Take 1 capsule (60 mg total) by mouth daily. ?  ?Eliquis DVT/PE Starter Pack ?Generic drug: Apixaban Starter Pack ('10mg'$  and '5mg'$ ) ?Take as directed on package: start with two-'5mg'$  tablets twice daily for 7 days. On day 8, switch to one-'5mg'$  tablet twice daily. ?  ?enalapril 20 MG tablet ?Commonly known as: VASOTEC ?Take 1 tablet (20 mg total) by mouth daily. ?  ?famotidine 20 MG tablet ?Commonly known as: Pepcid ?Take 1 tablet (20 mg total) by mouth daily. ?  ?ferrous sulfate 325 (65 FE) MG tablet ?Take 325 mg by mouth daily with breakfast. ?  ?Global Ease Inject Pen Needles 32G X 4 MM Misc ?Generic drug: Insulin Pen Needle ?USE 1 SYRINGE DAILY TO INJECT VICTOZA ?  ?hydrocortisone 2.5 % rectal cream ?Commonly known as: ANUSOL-HC ?Place 1 application rectally 2 (two) times daily as needed for  hemorrhoids or anal itching. Please use twice daily x5 days, and then twice daily as needed. ?  ?Insulin Syringe-Needle U-100 31G X 5/16" 0.3 ML Misc ?Commonly known as: Sure Comfort Insulin Syringe ?Inject 1 Syringe into the skin daily. ?  ?ketoconazole 2 % cream ?Commonly known as: NIZORAL ?Apply 1 application topically daily as needed for irritation. ?  ?Lantus SoloStar 100 UNIT/ML Solostar Pen ?Generic drug: insulin glargine ?Inject 5 Units into the skin every morning. ?What changed: how much to take ?  ?levothyroxine 50 MCG tablet ?Commonly known as: SYNTHROID ?Take 1 tablet (50 mcg total) by mouth daily. ?  ?metFORMIN 500 MG 24 hr tablet ?Commonly known as: GLUCOPHAGE-XR ?Take 2 tablets (1,000 mg total) by mouth daily with breakfast. ?  ?mirabegron ER 25 MG Tb24 tablet ?Commonly known as: Myrbetriq ?Take 1 tablet (25 mg total) by mouth daily. ?  ?ondansetron 4 MG disintegrating tablet ?Commonly known as: Zofran ODT ?Take 1 tablet (4 mg total) by mouth every 8 (eight) hours as needed for nausea. ?  ?polyethylene glycol powder 17 GM/SCOOP powder ?Commonly known as: GLYCOLAX/MIRALAX ?Take 17 g by mouth in the morning and at bedtime. ?  ?PreserVision AREDS  2+Multi Vit Caps ?Take 1 capsule by mouth daily. ?  ?Rybelsus 3 MG Tabs ?Generic drug: Semaglutide ?Take 3 mg by mouth daily. ?  ?Sarna lotion ?Generic drug: camphor-menthol ?Apply 1 application topically 4 (four) times daily as needed for itching. ?  ?senna 8.6 MG Tabs tablet ?Commonly known as: SENOKOT ?Take 1 tablet (8.6 mg total) by mouth 2 (two) times daily. ?  ?tamsulosin 0.4 MG Caps capsule ?Commonly known as: FLOMAX ?Take 0.4 mg by mouth every morning. ?  ?Transfer Board Misc ?Use as needed for transfer from bed to chair or vice versa ?  ?Vyzulta 0.024 % Soln ?Generic drug: Latanoprostene Bunod ?Place 1 drop into both eyes at bedtime. ?  ?witch hazel-glycerin pad ?Commonly known as: TUCKS ?Apply 1 application topically 2 (two) times daily as needed for  itching. ?  ? ?  ? ?  ?  ? ? ?  ?Durable Medical Equipment  ?(From admission, onward)  ?  ? ? ?  ? ?  Start     Ordered  ? 11/05/21 1635  For home use only DME standard manual wheelchair with seat cushion  Onc

## 2021-11-05 NOTE — Progress Notes (Signed)
ANTICOAGULATION CONSULT NOTE - Follow Up Consult ? ?Pharmacy Consult for IV Heparin>>Eliquis  ?Indication: DVT ? ?Allergies  ?Allergen Reactions  ? Tramadol Itching  ?  Takes occasionally with benadryl  ? Levemir [Insulin Detemir] Itching  ? ? ?Patient Measurements: ?Height: '5\' 2"'$  (157.5 cm) ?Weight: 66.7 kg (147 lb 0.8 oz) ?IBW/kg (Calculated) : 50.1 ?Heparin Dosing Weight: 63.8 kg ? ?Vital Signs: ?Temp: 98.2 ?F (36.8 ?C) (03/17 0809) ?Temp Source: Oral (03/17 0809) ?BP: 151/77 (03/17 0809) ?Pulse Rate: 83 (03/17 0809) ? ?Labs: ?Recent Labs  ?  11/04/21 ?1120 11/04/21 ?1928 11/05/21 ?0127  ?HGB 10.6*  --  9.0*  ?HCT 33.3*  --  27.8*  ?PLT 203  --  179  ?HEPARINUNFRC  --  0.69 0.57  ?CREATININE 0.86  --  0.86  ? ? ? ?Estimated Creatinine Clearance: 40.5 mL/min (by C-G formula based on SCr of 0.86 mg/dL). ? ? ?Medications:  ?Infusions:  ? ? ? ?Assessment: ?88 years of age female on IV Heparin for RLE DVT. Patient has a history of bilateral DVT s/p IVC filter in May 2022. ? ?Heparin level therapeutic x2 at 0.57 3/17 AM with heparin running at 1,100 units/hour. Slight Hgb drop from 10.6>9, PLT 203>179. No signs or symptoms of bleeding or issues with infusion per RN. Pharmacy consulted to switch from heparin to Eliquis.  ? ?Goal of Therapy:  ?Heparin level 0.3-0.7 units/ml ?Monitor platelets by anticoagulation protocol: Yes ?  ?Plan:  ?STOP heparin infusion  ?START Eliquis 10 mg BID x 7 days, followed by 5 mg BID thereafter  ?Administer first dose of Eliquis at the same time heparin infusion is stopped  ?FU PM CBC, daily CBC  ?Monitor for any bleeding ? ? ?Adria Dill, PharmD ?PGY-1 Acute Care Resident  ?11/05/2021 12:05 PM  ? ? ? ?

## 2021-11-05 NOTE — TOC Initial Note (Addendum)
Transition of Care (TOC) - Initial/Assessment Note  ? ? ?Patient Details  ?Name: Hayley Fisher ?MRN: [id number] ?Date of Birth: September 23, 1930 ? ?Transition of Care (TOC) CM/SW Contact:    ?Verdell Carmine, RN ?Phone Number: ?11/05/2021, 1:26 PM ? ?Clinical Narrative:                 ?Spoke to patient and called daughter as well. Regarding discharge planning. Patient hs a wheelchair, walker ( but the walker is old and the wheelchair does not have feet to elevate legs. Patient and daughter would like home health PT  to assist with walking. ?Her catheter will be changed as it is hitting 30 days in.  She has a foley for retention and follows up with urology.  ? ?Order sent for RW with 4 wheels,  asked about leg extensions for current wheelchair as it is only a year old. Her daughter can transport her home when discharged ?CM will follow for needs, recommendations, and transitions of care. ?[employer] states she can get a wheelchair or walker covered but  both. Speaking to patient regarding self pay for walker . She declines all for now. She has a recliner that she can sit in and elevate her feet.  Paged FM team for discussion on HH ? ?Expected Discharge Plan: [place] ?Barriers to Discharge: Continued Medical Work up ? ? ?Patient Goals and CMS Choice ?  ?  ?  ? ?Expected Discharge Plan and Services ?Expected Discharge Plan: [place] ?  ?Discharge Planning Services: CM Consult ?Post Acute Care Choice: Home Health ?Living arrangements for the past 2 months: [place] ?                ?DME Arranged: Walker rolling with seat ?  ?Date DME Agency Contacted: 11/05/21 ?Time DME Agency Contacted: [phone] ?Representative spoke with at DME Agency: Freda Munro ?  ?  ?  ?  ?  ? ?Prior Living Arrangements/Services ?Living arrangements for the past 2 months: [place] ?Lives with:: Adult Children ?  ?Do you feel safe going back to the place where you live?: Yes      ?  ?  ?  ?  ? ?Activities of  Daily Living ?Home Assistive Devices/Equipment: Wheelchair ?ADL Screening (condition at time of admission) ?Patient's cognitive ability adequate to safely complete daily activities?: No ?Is the patient deaf or have difficulty hearing?: No ?Does the patient have difficulty seeing, even when wearing glasses/contacts?: Yes ?Does the patient have difficulty concentrating, remembering, or making decisions?: No ?Patient able to express need for assistance with ADLs?: Yes ?Does the patient have difficulty dressing or bathing?: No ?Independently performs ADLs?: No ?Does the patient have difficulty walking or climbing stairs?: Yes ?Weakness of Legs: Both ?Weakness of Arms/Hands: None ? ?Permission Sought/Granted ?  ?  ?   ?   ?   ?   ? ?Emotional Assessment ?  ?Attitude/Demeanor/Rapport: Engaged ?Affect (typically observed): Appropriate ?Orientation: : Oriented to Self, Oriented to Place, Oriented to  Time ?Alcohol / Substance Use: Not Applicable ?Psych Involvement: No (comment) ? ?Admission diagnosis:  DVT (deep venous thrombosis) ([place]) [I82.409] ?Acute deep vein thrombosis (DVT) of femoral vein of right lower extremity ([place]) [I82.411] ?Patient Active Problem List  ? Diagnosis Date Noted  ? DVT (deep venous thrombosis) ([place]) 11/04/2021  ? Anxiety 09/09/2021  ? Goals of care, counseling/discussion 09/09/2021  ? Constipation 08/31/2021  ? Insomnia 08/31/2021  ? Incontinence 07/14/2021  ? Normocytic  anemia 06/27/2021  ? Leukocytopenia 06/27/2021  ? Malnutrition of moderate degree 02/23/2021  ? Cognitive impairment 02/21/2021  ? S/P IVC filter 01/16/2021  ? Hypothyroidism 12/09/2020  ? Gastroesophageal reflux disease without esophagitis 09/16/2020  ? Urinary frequency 09/11/2020  ? Unstable gait 04/03/2018  ? Frequent falls 04/03/2018  ? Age-related physical debility 03/22/2018  ? Skin ulcer of sacrum, limited to breakdown of skin ([place]) 03/16/2017  ? Spinal stenosis of lumbar region 01/13/2016  ? Chronic venous insufficiency  11/09/2015  ? DDD (degenerative disc disease), lumbosacral 11/24/2014  ? Constipation by delayed colonic transit 11/24/2014  ? Frequent UTI 10/29/2012  ? Pruritus 03/17/2011  ? Type 2 diabetes mellitus with diabetic neuropathy ([place]) 10/19/2006  ? Hyperlipidemia associated with type 2 diabetes mellitus ([place]) 10/19/2006  ? Hypertension associated with diabetes ([place]) 10/19/2006  ? Osteopenia 10/19/2006  ? ?PCP:  [name], MD ?Pharmacy:   ?[facility] - [place], [place] - [address] ?[address] ?[place] [address] ?Phone: [phone] Fax: [phone] ? ? ? ? ?Social Determinants of Health (SDOH) Interventions ?  ? ?Readmission Risk Interventions ?Readmission Risk Prevention Plan 07/05/2020  ?Transportation Screening Complete  ?Home Care Screening Complete  ?Medication Review (RN CM) Complete  ?Some recent data might be hidden  ? ? ? ?

## 2021-11-06 ENCOUNTER — Encounter (HOSPITAL_COMMUNITY): Payer: Self-pay | Admitting: *Deleted

## 2021-11-06 ENCOUNTER — Other Ambulatory Visit: Payer: Self-pay

## 2021-11-06 ENCOUNTER — Emergency Department (HOSPITAL_COMMUNITY)
Admission: EM | Admit: 2021-11-06 | Discharge: 2021-11-06 | Disposition: A | Discharge status: Home / Self Care | Payer: Medicare Other | Attending: Emergency Medicine | Admitting: Emergency Medicine

## 2021-11-06 ENCOUNTER — Encounter: Payer: Self-pay | Admitting: Student

## 2021-11-06 DIAGNOSIS — E119 Type 2 diabetes mellitus without complications: Secondary | ICD-10-CM | POA: Insufficient documentation

## 2021-11-06 DIAGNOSIS — R6889 Other general symptoms and signs: Secondary | ICD-10-CM | POA: Diagnosis not present

## 2021-11-06 DIAGNOSIS — Z794 Long term (current) use of insulin: Secondary | ICD-10-CM | POA: Diagnosis not present

## 2021-11-06 DIAGNOSIS — Z7901 Long term (current) use of anticoagulants: Secondary | ICD-10-CM | POA: Insufficient documentation

## 2021-11-06 DIAGNOSIS — I10 Essential (primary) hypertension: Secondary | ICD-10-CM | POA: Diagnosis not present

## 2021-11-06 DIAGNOSIS — R310 Gross hematuria: Secondary | ICD-10-CM | POA: Diagnosis not present

## 2021-11-06 DIAGNOSIS — E039 Hypothyroidism, unspecified: Secondary | ICD-10-CM | POA: Insufficient documentation

## 2021-11-06 DIAGNOSIS — Z79899 Other long term (current) drug therapy: Secondary | ICD-10-CM | POA: Diagnosis not present

## 2021-11-06 DIAGNOSIS — R319 Hematuria, unspecified: Secondary | ICD-10-CM | POA: Diagnosis present

## 2021-11-06 DIAGNOSIS — Z743 Need for continuous supervision: Secondary | ICD-10-CM | POA: Diagnosis not present

## 2021-11-06 DIAGNOSIS — Z7984 Long term (current) use of oral hypoglycemic drugs: Secondary | ICD-10-CM | POA: Diagnosis not present

## 2021-11-06 LAB — CBC WITH DIFFERENTIAL/PLATELET
Abs Immature Granulocytes: 0.01 10*3/uL (ref 0.00–0.07)
Basophils Absolute: 0 10*3/uL (ref 0.0–0.1)
Basophils Relative: 1 %
Eosinophils Absolute: 0.1 10*3/uL (ref 0.0–0.5)
Eosinophils Relative: 3 %
HCT: 32.9 % — ABNORMAL LOW (ref 36.0–46.0)
Hemoglobin: 10.5 g/dL — ABNORMAL LOW (ref 12.0–15.0)
Immature Granulocytes: 0 %
Lymphocytes Relative: 20 %
Lymphs Abs: 0.5 10*3/uL — ABNORMAL LOW (ref 0.7–4.0)
MCH: 30.9 pg (ref 26.0–34.0)
MCHC: 31.9 g/dL (ref 30.0–36.0)
MCV: 96.8 fL (ref 80.0–100.0)
Monocytes Absolute: 0.2 10*3/uL (ref 0.1–1.0)
Monocytes Relative: 9 %
Neutro Abs: 1.7 10*3/uL (ref 1.7–7.7)
Neutrophils Relative %: 67 %
Platelets: 193 10*3/uL (ref 150–400)
RBC: 3.4 MIL/uL — ABNORMAL LOW (ref 3.87–5.11)
RDW: 13.2 % (ref 11.5–15.5)
WBC: 2.6 10*3/uL — ABNORMAL LOW (ref 4.0–10.5)
nRBC: 0 % (ref 0.0–0.2)

## 2021-11-06 LAB — URINALYSIS, ROUTINE W REFLEX MICROSCOPIC
Glucose, UA: NEGATIVE mg/dL
Ketones, ur: NEGATIVE mg/dL
Nitrite: POSITIVE — AB
Protein, ur: 100 mg/dL — AB
Specific Gravity, Urine: 1.015 (ref 1.005–1.030)
pH: 7 (ref 5.0–8.0)

## 2021-11-06 LAB — BASIC METABOLIC PANEL
Anion gap: 8 (ref 5–15)
BUN: 17 mg/dL (ref 8–23)
CO2: 26 mmol/L (ref 22–32)
Calcium: 8.6 mg/dL — ABNORMAL LOW (ref 8.9–10.3)
Chloride: 103 mmol/L (ref 98–111)
Creatinine, Ser: 0.84 mg/dL (ref 0.44–1.00)
GFR, Estimated: >60 mL/min (ref >60)
Glucose, Bld: 186 mg/dL — ABNORMAL HIGH (ref 70–99)
Potassium: 4 mmol/L (ref 3.5–5.1)
Sodium: 137 mmol/L (ref 135–145)

## 2021-11-06 LAB — URINALYSIS, MICROSCOPIC (REFLEX)
RBC / HPF: >50 RBC/hpf (ref 0–5)
WBC, UA: >50 WBC/hpf (ref 0–5)

## 2021-11-06 NOTE — ED Notes (Signed)
The daughter stated pt has had the foley for 6 months d/t frequent UTI's and chronic kidney failure. ?

## 2021-11-06 NOTE — ED Notes (Signed)
Provided pt with water approved by EDP ?

## 2021-11-06 NOTE — Discharge Instructions (Signed)
Eliquis use is likely causing the blood in urine. At this point it is not a good idea to stop Eliquis so I think that she should continue taking this as prescribed. Follow up with PCP. Labs look stable here. Return if worsening symptoms such as weakness, fatigue, or dizziness.  ?

## 2021-11-06 NOTE — ED Provider Notes (Signed)
?[facility] ?Provider Note ? ? ?CSN: [id number] ?Arrival date & time: 11/06/21  1045 ? ?  ? ?History ?PMH: recent DVT on Eliquis, DM2, HTN, Urinary retention with chronic foley, HLD, hypothyroidism, Anxiety, Constipation, chronic normocytic anemia, wheelchair bound, CVD ?Chief Complaint  ?Patient presents with  ? Hematuria  ? ? ?Hayley Fisher is a 88 y.o. female. ?Presents with a chief complaint of hematuria.  She states that she was just recently discharged yesterday for a DVT.  She is recently started on Eliquis therapy.  She states that last night, her urine appeared normal.  She says this morning she woke up and noticed blood in the Foley bag.  Blood is dark red.  She has no abdominal pain.  Denies fevers or chills.  She is not bleeding anywhere else. No other complaints ? ?Hematuria ? ? ?  ? ?Home Medications ?Prior to Admission medications   ?Medication Sig Start Date End Date Taking? Authorizing Provider  ?Accu-Chek Softclix Lancets lancets 1 each by Other route See admin instructions. USE TO TEST BLOOD SUGAR UP  TO 3 TIMES A DAY. 02/25/21   Jennye Boroughs, MD  ?APIXABAN Arne [place]) VTE STARTER PACK ('10MG'$  AND '5MG'$ ) Take as directed on package: start with two-'5mg'$  tablets twice daily for 7 days. On day 8, switch to one-'5mg'$  tablet twice daily. 11/05/21   Dameron, Luna Fuse, DO  ?atorvastatin (LIPITOR) 40 MG tablet TAKE 1 TABLET BY MOUTH EVERY DAY ?Patient taking differently: Take 40 mg by mouth daily. 07/30/21   [name], MD  ?brimonidine (ALPHAGAN) 0.2 % ophthalmic solution Place 1 drop into the left eye 2 (two) times daily. 03/30/21   [provider]  ?carbamide peroxide (DEBROX) 6.5 % OTIC solution Place 2 drops into both ears 2 (two) times daily as needed (ear wax). 01/20/21   Aline August, MD  ?Catheter Self-Adhesive Urinary MISC 1 patch by Does not apply route daily. 10/26/20   Richarda Osmond, MD  ?cetirizine (ZYRTEC) 10 MG tablet Take 10 mg by mouth daily as  needed for allergies.    [provider]  ?clotrimazole-betamethasone (LOTRISONE) cream Apply 1 application topically 2 (two) times daily as needed (rash/itching). 05/13/21   [provider]  ?dorzolamide-timolol (COSOPT) 22.3-6.8 MG/ML ophthalmic solution Place 1 drop into both eyes 2 (two) times daily. 05/18/20   [provider]  ?DULoxetine (CYMBALTA) 60 MG capsule Take 1 capsule (60 mg total) by mouth daily. 02/25/21   Jennye Boroughs, MD  ?enalapril (VASOTEC) 20 MG tablet Take 1 tablet (20 mg total) by mouth daily. 11/03/21   [name], MD  ?famotidine (PEPCID) 20 MG tablet Take 1 tablet (20 mg total) by mouth daily. 10/06/21 12/05/21  [name], MD  ?ferrous sulfate 325 (65 FE) MG tablet Take 325 mg by mouth daily with breakfast.    [provider]  ?GLOBAL EASE INJECT PEN NEEDLES 32G X 4 MM MISC USE 1 SYRINGE DAILY TO INJECT VICTOZA 09/13/21   Renato Shin, MD  ?glucose blood (ACCU-CHEK AVIVA PLUS) test strip 1 each by Other route See admin instructions. USE TO TEST BLOOD SUGAR UP  TO 3 TIMES A DAY. 02/25/21   Jennye Boroughs, MD  ?hydrocortisone (ANUSOL-HC) 2.5 % rectal cream Place 1 application rectally 2 (two) times daily as needed for hemorrhoids or anal itching. Please use twice daily x5 days, and then twice daily as needed. 07/02/21   Eugenie Filler, MD  ?insulin glargine (LANTUS SOLOSTAR) 100 UNIT/ML Solostar Pen Inject 5 Units  into the skin every morning. 11/05/21   France Ravens, MD  ?Insulin Syringe-Needle U-100 (SURE COMFORT INSULIN SYRINGE) 31G X 5/16" 0.3 ML MISC Inject 1 Syringe into the skin daily. 02/25/21   Jennye Boroughs, MD  ?ketoconazole (NIZORAL) 2 % cream Apply 1 application topically daily as needed for irritation. 03/11/21   [provider]  ?Latanoprostene Bunod (VYZULTA) 0.024 % SOLN Place 1 drop into both eyes at bedtime.    [provider]  ?levothyroxine (SYNTHROID) 50 MCG tablet Take 1 tablet (50 mcg total) by mouth daily.  11/10/20   Renato Shin, MD  ?metFORMIN (GLUCOPHAGE-XR) 500 MG 24 hr tablet Take 2 tablets (1,000 mg total) by mouth daily with breakfast. 04/06/21   Renato Shin, MD  ?mirabegron ER (MYRBETRIQ) 25 MG TB24 tablet Take 1 tablet (25 mg total) by mouth daily. 11/03/21   [name], MD  ?Misc. Devices (TRANSFER BOARD) MISC Use as needed for transfer from bed to chair or vice versa 02/25/21   Jennye Boroughs, MD  ?Multiple Vitamins-Minerals (PRESERVISION AREDS 2+MULTI VIT) CAPS Take 1 capsule by mouth daily.    [provider]  ?ondansetron (ZOFRAN ODT) 4 MG disintegrating tablet Take 1 tablet (4 mg total) by mouth every 8 (eight) hours as needed for nausea. 09/09/21   [name], MD  ?polyethylene glycol powder (GLYCOLAX/MIRALAX) 17 GM/SCOOP powder Take 17 g by mouth in the morning and at bedtime. 08/20/21   [name], MD  ?Timoteo Ace lotion Apply 1 application topically 4 (four) times daily as needed for itching. 05/27/21   [provider]  ?Semaglutide (RYBELSUS) 3 MG TABS Take 3 mg by mouth daily. 11/09/20   Renato Shin, MD  ?senna (SENOKOT) 8.6 MG TABS tablet Take 1 tablet (8.6 mg total) by mouth 2 (two) times daily. 08/09/21   Eppie Gibson, MD  ?tamsulosin (FLOMAX) 0.4 MG CAPS capsule Take 0.4 mg by mouth every morning. 10/08/20   [provider]  ?witch hazel-glycerin (TUCKS) pad Apply 1 application topically 2 (two) times daily as needed for itching. 02/25/21   Jennye Boroughs, MD  ?   ? ?Allergies    ?Tramadol and Levemir [insulin detemir]   ? ?Review of Systems   ?Review of Systems  ?Genitourinary:  Positive for hematuria.  ?All other systems reviewed and are negative. ? ?Physical Exam ?Updated Vital Signs ?BP (!) 183/90 (BP Location: Left Arm) Comment: RN notified.  Pulse 82   Temp 98.1 ?F (36.7 ?C) (Oral)   Resp 16   Ht '5\' 3"'$  (1.6 m)   Wt 66.7 kg   SpO2 100%   BMI 26.05 kg/m?  ?Physical Exam ?Vitals and nursing note reviewed.  ?Constitutional:   ?   General: She is  not in acute distress. ?   Appearance: Normal appearance. She is well-developed. She is not ill-appearing, toxic-appearing or diaphoretic.  ?HENT:  ?   Head: Normocephalic and atraumatic.  ?   Nose: No nasal deformity.  ?   Mouth/Throat:  ?   Lips: Pink. No lesions.  ?Eyes:  ?   General: Gaze aligned appropriately. No scleral icterus.    ?   Right eye: No discharge.     ?   Left eye: No discharge.  ?   Conjunctiva/sclera: Conjunctivae normal.  ?   Right eye: Right conjunctiva is not injected. No exudate or hemorrhage. ?   Left eye: Left conjunctiva is not injected. No exudate or hemorrhage. ?Pulmonary:  ?   Effort: Pulmonary effort is normal. No respiratory  distress.  ?Abdominal:  ?   General: Abdomen is flat. There is no distension.  ?   Palpations: Abdomen is soft. There is no mass.  ?   Tenderness: There is no abdominal tenderness. There is no right CVA tenderness, left CVA tenderness, guarding or rebound.  ?   Hernia: No hernia is present.  ?Genitourinary: ?   Comments: Foley bag with dark red color. No clots noted.  ?Skin: ?   General: Skin is warm and dry.  ?Neurological:  ?   Mental Status: She is alert and oriented to person, place, and time.  ?Psychiatric:     ?   Mood and Affect: Mood normal.     ?   Speech: Speech normal.     ?   Behavior: Behavior normal. Behavior is cooperative.  ? ? ?ED Results / Procedures / Treatments   ?Labs ?(all labs ordered are listed, but only abnormal results are displayed) ?Labs Reviewed  ?URINALYSIS, ROUTINE W REFLEX MICROSCOPIC - Abnormal; Notable for the following components:  ?    Result Value  ? Color, Urine RED (*)   ? APPearance CLOUDY (*)   ? Hgb urine dipstick LARGE (*)   ? Bilirubin Urine SMALL (*)   ? Protein, ur 100 (*)   ? Nitrite POSITIVE (*)   ? Leukocytes,Ua LARGE (*)   ? All other components within normal limits  ?CBC WITH DIFFERENTIAL/PLATELET - Abnormal; Notable for the following components:  ? WBC 2.6 (*)   ? RBC 3.40 (*)   ? Hemoglobin 10.5 (*)   ? HCT  32.9 (*)   ? Lymphs Abs 0.5 (*)   ? All other components within normal limits  ?BASIC METABOLIC PANEL - Abnormal; Notable for the following components:  ? Glucose, Bld 186 (*)   ? Calcium 8.6 (*)   ? All other comp

## 2021-11-06 NOTE — ED Triage Notes (Signed)
Patient presents to ED via [facility] states she was just discharged from hospital yest after being here for Blood clot in her leg, was started on Eliquis. Caretaker noticed blood in her foley last night and this am noticed more blood.  ?

## 2021-11-07 LAB — URINE CULTURE: Culture: >=100000 — AB

## 2021-11-08 DIAGNOSIS — R531 Weakness: Secondary | ICD-10-CM | POA: Diagnosis not present

## 2021-11-09 ENCOUNTER — Telehealth (HOSPITAL_BASED_OUTPATIENT_CLINIC_OR_DEPARTMENT_OTHER): Payer: Self-pay

## 2021-11-09 ENCOUNTER — Other Ambulatory Visit (HOSPITAL_COMMUNITY): Payer: Self-pay

## 2021-11-09 ENCOUNTER — Ambulatory Visit (INDEPENDENT_AMBULATORY_CARE_PROVIDER_SITE_OTHER): Payer: Medicare Other | Admitting: Family Medicine

## 2021-11-09 ENCOUNTER — Other Ambulatory Visit: Payer: Self-pay

## 2021-11-09 VITALS — BP 165/73 | HR 93 | Ht 63.0 in

## 2021-11-09 DIAGNOSIS — Z95828 Presence of other vascular implants and grafts: Secondary | ICD-10-CM

## 2021-11-09 DIAGNOSIS — D649 Anemia, unspecified: Secondary | ICD-10-CM

## 2021-11-09 DIAGNOSIS — R319 Hematuria, unspecified: Secondary | ICD-10-CM | POA: Diagnosis not present

## 2021-11-09 DIAGNOSIS — I824Y9 Acute embolism and thrombosis of unspecified deep veins of unspecified proximal lower extremity: Secondary | ICD-10-CM | POA: Diagnosis not present

## 2021-11-09 LAB — POCT HEMOGLOBIN: Hemoglobin: 7.7 g/dL — AB (ref 11–14.6)

## 2021-11-09 NOTE — Telephone Encounter (Signed)
Pharmacy Transitions of Care Follow-up Telephone Call ? ?Date of discharge: 11/05/21  ?Discharge Diagnosis: DVT ? ?How have you been since you were released from the hospital? Spoke with patient's daughter with the patient in the room. Patient is doing well, but had to back to the ER the day after discharge due to blood in the urine. They determined in the ER that the blood loss was minimal and advised the patient that coloring should turn back to normal soon, however it is still a burgundy color. They also wanted to keep on blood thinners due to large clot burden. Patient has a follow up appt with PCP in about 2 hours and will discuss and get more blood work done.  ? ?Medication changes made at discharge: ?START taking these medications ? ?START taking these medications  ?Eliquis DVT/PE Starter Pack ?Generic drug: Apixaban Starter Pack ('10mg'$  and '5mg'$ ) ?Take as directed on package: start with two-'5mg'$  tablets twice daily for 7 days. On day 8, switch to one-'5mg'$  tablet twice daily.  ? ?CHANGE how you take these medications ? ?CHANGE how you take these medications  ?Lantus SoloStar 100 UNIT/ML Solostar Pen ?Generic drug: insulin glargine ?Inject 5 Units into the skin every morning. ?What changed: how much to take  ? ?CONTINUE taking these medications ? ?CONTINUE taking these medications  ?Accu-Chek Aviva Plus test strip ?Generic drug: glucose blood ?1 each by Other route See admin instructions. USE TO TEST BLOOD SUGAR UP TO 3 TIMES A DAY.  ?Accu-Chek Softclix Lancets lancets ?1 each by Other route See admin instructions. USE TO TEST BLOOD SUGAR UP TO 3 TIMES A DAY.  ?atorvastatin 40 MG tablet ?Commonly known as: LIPITOR ?TAKE 1 TABLET BY MOUTH EVERY DAY  ?brimonidine 0.2 % ophthalmic solution ?Commonly known as: ALPHAGAN  ?Catheter Self-Adhesive Urinary Misc ?1 patch by Does not apply route daily.  ?cetirizine 10 MG tablet ?Commonly known as: ZYRTEC  ?clotrimazole-betamethasone cream ?Commonly known as: LOTRISONE   ?Debrox 6.5 % OTIC solution ?Generic drug: carbamide peroxide ?Place 2 drops into both ears 2 (two) times daily as needed (ear wax).  ?dorzolamide-timolol 22.3-6.8 MG/ML ophthalmic solution ?Commonly known as: COSOPT  ?DULoxetine 60 MG capsule ?Commonly known as: CYMBALTA ?Take 1 capsule (60 mg total) by mouth daily.  ?enalapril 20 MG tablet ?Commonly known as: VASOTEC ?Take 1 tablet (20 mg total) by mouth daily.  ?famotidine 20 MG tablet ?Commonly known as: Pepcid ?Take 1 tablet (20 mg total) by mouth daily.  ?ferrous sulfate 325 (65 FE) MG tablet  ?Global Ease Inject Pen Needles 32G X 4 MM Misc ?Generic drug: Insulin Pen Needle ?USE 1 SYRINGE DAILY TO INJECT VICTOZA  ?hydrocortisone 2.5 % rectal cream ?Commonly known as: ANUSOL-HC ?Place 1 application rectally 2 (two) times daily as needed for hemorrhoids or anal itching. Please use twice daily x5 days, and then twice daily as needed.  ?Insulin Syringe-Needle U-100 31G X 5/16" 0.3 ML Misc ?Commonly known as: Sure Comfort Insulin Syringe ?Inject 1 Syringe into the skin daily.  ?ketoconazole 2 % cream ?Commonly known as: NIZORAL  ?levothyroxine 50 MCG tablet ?Commonly known as: SYNTHROID ?Take 1 tablet (50 mcg total) by mouth daily.  ?metFORMIN 500 MG 24 hr tablet ?Commonly known as: GLUCOPHAGE-XR ?Take 2 tablets (1,000 mg total) by mouth daily with breakfast.  ?mirabegron ER 25 MG Tb24 tablet ?Commonly known as: Myrbetriq ?Take 1 tablet (25 mg total) by mouth daily.  ?ondansetron 4 MG disintegrating tablet ?Commonly known as: Zofran ODT ?Take 1 tablet (4 mg  total) by mouth every 8 (eight) hours as needed for nausea.  ?polyethylene glycol powder 17 GM/SCOOP powder ?Commonly known as: GLYCOLAX/MIRALAX ?Take 17 g by mouth in the morning and at bedtime.  ?PreserVision AREDS 2+Multi Vit Caps  ?Rybelsus 3 MG Tabs ?Generic drug: Semaglutide ?Take 3 mg by mouth daily.  ?Sarna lotion ?Generic drug: camphor-menthol  ?senna 8.6 MG Tabs tablet ?Commonly known as:  SENOKOT ?Take 1 tablet (8.6 mg total) by mouth 2 (two) times daily.  ?tamsulosin 0.4 MG Caps capsule ?Commonly known as: FLOMAX  ?Transfer Board Misc ?Use as needed for transfer from bed to chair or vice versa  ?Vyzulta 0.024 % Soln ?Generic drug: Latanoprostene Bunod  ?witch hazel-glycerin pad ?Commonly known as: TUCKS ?Apply 1 application topically 2 (two) times daily as needed for itching.  ? ?STOP taking these medications ? ?STOP taking these medications  ?aspirin 81 MG chewable tablet  ? ? ?Medication changes verified by the patient? yes ?  ? ?Medication Accessibility: ? ?Home Pharmacy: [facility]  ? ?Was the patient provided with refills on discharged medications? No  ? ?Have all prescriptions been transferred from [facility] to home pharmacy? N/a   ? ?Medication Review: ?APIXABAN (ELIQUIS)  ?Apixaban 10 mg BID initiated on 11/05/21. Will switch to apixaban 5 mg BID after 7 days (DATE 11/12/21).  ?- Discussed importance of taking medication around the same time everyday  ?- Reviewed potential DDIs with patient  ?- Advised patient of medications to avoid (NSAIDs, ASA)  ?- Educated that Tylenol (acetaminophen) will be the preferred analgesic to prevent risk of bleeding  ?- Emphasized importance of monitoring for signs and symptoms of bleeding (abnormal bruising, prolonged bleeding, nose bleeds, bleeding from gums, discolored urine, black tarry stools)  ?- Advised patient to alert all providers of anticoagulation therapy prior to starting a new medication or having a procedure  ? ?Follow-up Appointments: ?Date Visit Type Length Department   ? 11/09/2021  2:30 PM OFFICE VISIT 20 min [place] [[id number]  ? ? ?If their condition worsens, is the pt aware to call PCP or go to the Emergency Dept.? yes ? ?Final Patient Assessment: ?Spoke with patient's daughter with the patient in the room. Patient is doing well, but had to back to the ER the day after discharge due to blood in the urine. They  determined in the ER that the blood loss was minimal and advised the patient that coloring should turn back to normal soon, however it is still a burgundy color. They also wanted to keep on blood thinners due to large clot burden. Patient has a follow up appt with PCP in about 2 hours and will discuss and get more blood work done.  ? ?Darcus Austin, PharmD ?Clinical Pharmacist ?[facility] ?11/09/2021 11:56 AM ? ?

## 2021-11-09 NOTE — Patient Instructions (Addendum)
Hemoglobin today was 7.7 which is down from her emergency department visit on 3/18 which was 10.5.  She is near having to get a transfusion. We need to recheck on Friday.   ? ?Stop the Eliquis for now.   ?   ? ? ? ? ? ?

## 2021-11-09 NOTE — Progress Notes (Signed)
? ?  SUBJECTIVE:  ? ?CHIEF COMPLAINT / HPI:  ? ? ?Hayley Fisher is a 88 y.o. female here for hospital follow-up for DVT.  Patient accompanied to her visit by her daughter, Hayley Fisher, whom she lives with.  ? ?Patient has been having hematuria since Saturday, 3/18.  She was seen in the ED for this and was told to continue taking the Eliquis.  Patient and family are concerned that her urine is red.  Denies shortness of breath, chest pain, nosebleeds, blood in stool or melena.  She has had no recent falls.  Daughter notes that she complained of nausea twice and was given antinausea medicine that resolved the symptom.  ? ? ?PERTINENT  PMH / PSH: reviewed and updated as appropriate  ? ?OBJECTIVE:  ? ?BP (!) 165/73   Pulse 93   Ht '5\' 3"'$  (1.6 m)   SpO2 100%   BMI 26.05 kg/m?   ? ?GEN: pleasant elderly female, in no acute distress  ?CV: regular rate and rhythm ?RESP: no increased work of breathing, clear to ascultation bilaterally ?GU: wearing adult incontinence underwear, Foley bag with visible with red-tinged urine ?MSK: seated in wheelchair ?SKIN: warm, dry, brisk cap refill, no visible ecchymosis  ? ?ASSESSMENT/PLAN:  ? ? ?DVT   ?Patient with history of multiple DVTs in the past recently discharged on Eliquis.  Is on 4/7 of 10 mg BID dosing. She has an IVC filter therefore is higher risk.  Has hematuria complication as below. May benefit from IVC removal. Consider vascular surgery referral at follow up.  ? ?Gross Hematuria  Acute blood loss anemia  ?Patient began having hematuria on Saturday.  She was seen in the emergency department found to have a hemoglobin of 10.5.  While waiting for hemoglobin in office today, we discussed the risk and benefits of continuing Eliquis. Hemoglobin returned at 7.7.  She is not currently symptomatic. Advised to stop Eliquis until she follows up on Friday for repeat hemoglobin.  Patient's daughter Hayley Fisher to update family and discuss continuing versus stopping Eliquis.  One of their family  members is a NP.  She has indwelling Foley catheter. ED urine culture was positive. Will need to assess for UTI symptoms at follow up Friday.  ED precautions provided. ? ?Consider following up with clinical pharmacist who specializes in anticoagulation.   ? ? ?Lyndee Hensen, DO ?PGY-3, [facility] ?11/09/2021  ? ? ? ? ? ? ? ? ?

## 2021-11-12 ENCOUNTER — Telehealth: Payer: Self-pay

## 2021-11-12 ENCOUNTER — Other Ambulatory Visit: Payer: Self-pay

## 2021-11-12 ENCOUNTER — Ambulatory Visit (INDEPENDENT_AMBULATORY_CARE_PROVIDER_SITE_OTHER): Payer: Medicare Other | Admitting: Family Medicine

## 2021-11-12 VITALS — BP 148/66 | HR 92

## 2021-11-12 DIAGNOSIS — N390 Urinary tract infection, site not specified: Secondary | ICD-10-CM | POA: Diagnosis not present

## 2021-11-12 DIAGNOSIS — D649 Anemia, unspecified: Secondary | ICD-10-CM

## 2021-11-12 DIAGNOSIS — R319 Hematuria, unspecified: Secondary | ICD-10-CM | POA: Diagnosis not present

## 2021-11-12 DIAGNOSIS — I824Y9 Acute embolism and thrombosis of unspecified deep veins of unspecified proximal lower extremity: Secondary | ICD-10-CM

## 2021-11-12 DIAGNOSIS — Z95828 Presence of other vascular implants and grafts: Secondary | ICD-10-CM | POA: Diagnosis not present

## 2021-11-12 MED ORDER — FOSFOMYCIN TROMETHAMINE 3 G PO PACK: 3.0000 g | PACK | Freq: Once | ORAL | 0 refills | Status: AC

## 2021-11-12 NOTE — Progress Notes (Signed)
? ? ?  SUBJECTIVE:  ? ?CHIEF COMPLAINT / HPI: ED f/u  ? ?Recent urine culx positive ?Patient has indwelling catheter. Urine culture is positive for both e coli and klebsiella with sensitivity report that is only sensitive for IV abx.  Reports having the sensation that she needs to urinate "constantly".  Patient's family reports that they would like patient's urinary tract infection to be treated as she has delirium frequently when she has any type of infection.  They request that if she needs IV antibiotics, that she be admitted to the hospital if that is only referred to get treatment.  They are also amenable to oral treatment.  Patient denies abdominal pain or fever. ? ? ?Anticoagulation ?Held due to bleeding (hematuria) and hgb of 7 0.7 ?Family wishes to discontinue her anticoagulation ?Family also request to have vascular surgery consultation to discuss recommendations for patient's IVC ? ? ?PERTINENT  PMH / PSH:  ?IVC placement ?DVT ?Hypertension ?Chronic venous insufficiency ?Type II-diabetes ?Hypothyroidism ?Indwelling urinary catheter ?Sacral ulcer ?Normocytic anemia ? ? ?OBJECTIVE:  ? ?BP (!) 148/66   Pulse 92   SpO2 98%   ?General: Elderly female seated in a wheelchair, appearing stated age in no acute distress ?HEENT: MMM ?Cardio: Normal S1 and S2, no S3 or S4. Rhythm is regular. No murmurs or rubs. ?Pulm: Clear to auscultation bilaterally, no crackles, wheezing, or diminished breath sounds. Normal respiratory effort ?Abdomen: Bowel sounds normal. Abdomen soft and non-tender.  Specifically no suprapubic tenderness ?Extremities: No peripheral edema. Warm & well perfused.  ? ? ?ASSESSMENT/PLAN:  ? ?S/P IVC filter ?Referral to vascular surgery for follow up of IVC filter considering patient will no longer be on anticoagulation per family request  ? ?Frequent UTI ?Urine culture positive for Klebsiella and E. coli sensitive to imipenem and Pip-Tazo ? ?Discussed with Dr. Valentina Lucks, recommended for fosfomycin  x1 ? ?Hematuria ?Present on recent U/A  ?AC held  ?Will DC per patient family request  ?Repeat Hgb today  ? ?  ? ? ?Eulis Foster, MD ?[place]  ?

## 2021-11-12 NOTE — Telephone Encounter (Signed)
Daughter calls nurse line reporting Fosfomycin needs a prior auth.  ? ?I pulled the authorization and submitted via covermymeds. ? ?[id number] ? ?I informed daughter to check back the with pharmacy throughout the weekend for approval status.  ? ?Reiterated ED precautions for infection.  ?

## 2021-11-12 NOTE — Patient Instructions (Signed)
Today we will recheck your hemoglobin. ? ?I have also submitted a referral to vascular surgery to discuss any recommendations for changes to the IVC filter. ? ?Please be on the look out for a call from the office to schedule an appointment. ? ?Please be on the look out for development of any fever of 100.4 or higher, chills,severe abdominal pain ?Or inability to tolerate fluids ? ?I we will follow-up with results once they are available. ?

## 2021-11-13 LAB — CBC
Hematocrit: 31.7 % — ABNORMAL LOW (ref 34.0–46.6)
Hemoglobin: 10.5 g/dL — ABNORMAL LOW (ref 11.1–15.9)
MCH: 31.5 pg (ref 26.6–33.0)
MCHC: 33.1 g/dL (ref 31.5–35.7)
MCV: 95 fL (ref 79–97)
Platelets: 261 10*3/uL (ref 150–450)
RBC: 3.33 x10E6/uL — ABNORMAL LOW (ref 3.77–5.28)
RDW: 12.5 % (ref 11.7–15.4)
WBC: 3.5 10*3/uL (ref 3.4–10.8)

## 2021-11-15 DIAGNOSIS — M6281 Muscle weakness (generalized): Secondary | ICD-10-CM | POA: Diagnosis not present

## 2021-11-15 DIAGNOSIS — M48061 Spinal stenosis, lumbar region without neurogenic claudication: Secondary | ICD-10-CM | POA: Diagnosis not present

## 2021-11-16 ENCOUNTER — Encounter: Payer: Self-pay | Admitting: Family Medicine

## 2021-11-16 NOTE — Assessment & Plan Note (Signed)
Urine culture positive for Klebsiella and E. coli sensitive to imipenem and Pip-Tazo ? ?Discussed with Dr. Valentina Lucks, recommended for fosfomycin x1 ?

## 2021-11-16 NOTE — Assessment & Plan Note (Signed)
Referral to vascular surgery for follow up of IVC filter considering patient will no longer be on anticoagulation per family request  ?

## 2021-11-16 NOTE — Assessment & Plan Note (Signed)
Present on recent U/A  ?AC held  ?Will DC per patient family request  ?Repeat Hgb today  ? ?

## 2021-11-22 ENCOUNTER — Ambulatory Visit: Payer: Medicare Other | Admitting: Hospice

## 2021-11-22 DIAGNOSIS — R319 Hematuria, unspecified: Secondary | ICD-10-CM

## 2021-11-22 DIAGNOSIS — K5901 Slow transit constipation: Secondary | ICD-10-CM | POA: Diagnosis not present

## 2021-11-22 DIAGNOSIS — I82402 Acute embolism and thrombosis of unspecified deep veins of left lower extremity: Secondary | ICD-10-CM | POA: Diagnosis not present

## 2021-11-22 DIAGNOSIS — Z515 Encounter for palliative care: Secondary | ICD-10-CM

## 2021-11-22 DIAGNOSIS — E1140 Type 2 diabetes mellitus with diabetic neuropathy, unspecified: Secondary | ICD-10-CM | POA: Diagnosis not present

## 2021-11-22 DIAGNOSIS — Z794 Long term (current) use of insulin: Secondary | ICD-10-CM | POA: Diagnosis not present

## 2021-11-22 NOTE — Progress Notes (Signed)
? ? ?Manufacturing engineer ?Community Palliative Care Consult Note ?Telephone: [phone]  ?Fax: [phone] ? ?PATIENT NAME: Hayley Fisher ?DOB: 08-Sep-1930 ?MRN: [id number] ? ?PRIMARY CARE PROVIDER:   [name], MD ?[name], MD ?[address] ?[place],  [place] [address] ? ?REFERRING PROVIDER: [name], MD ?[name], MD ?[address] ?[place],  [place] [address] ? ?RESPONSIBLE PARTY:   Self/Jackie ?[place] - grand daughter ?Hayley Fisher is home with patient [phone] ?Contact Information   ? ? Name Relation Home Work Mobile  ? Randson,Jackie Daughter [phone]  [phone]  ? Cornelius,LaLeisa Daughter   [phone]  ? [name] Sister [phone]    ? ?  ? ?TELEHEALTH VISIT STATEMENT ?Due to the COVID-19 crisis, this visit was done via telemedicine from my office and it was initiated and consent by this patient and or family. Video-audio (telehealth) contact was unable to be done due to technical barriers from the patient?s side. ?I connected with patient OR PROXY by a telephone  and verified that I am speaking with the correct person. I discussed the limitations of evaluation and management by telemedicine. The patient expressed understanding and agreed to proceed.  ?Visit is to build trust and highlight Palliative Medicine as specialized medical care for people living with serious illness, aimed at facilitating better quality of life through symptoms relief, assisting with advance care planning and complex medical decision making. Hayley Fisher is home with patient during visit and provided updates and patient's upcoming appointments.  This is a follow up visit. ? ?RECOMMENDATIONS/PLAN:  ? ?Advance Care Planning/Code Status: Patient is a Partial code - wants chest compressions, ACLS meds, no mechanical ventilation.  ? ?Goals of Care: Goals of care include to maximize quality of life and symptom management. MOST form was  earlier given to Hayley Fisher to further explore goals of care.   ?Patient was a hospice patient and discharged from service.  ? ?Visit consisted of counseling and education dealing with the complex and emotionally intense issues of symptom management and palliative care in the setting of serious and potentially life-threatening illness. Palliative care team will continue to support patient, patient's family, and medical team. ? ?Symptom Management/Plan: ?Type 2 DM with Neuropathy: Continue Metformin, Levirmir. Current A1c is 7.3 down from A1c 8.0 06/10/2021. No concentrated sweets. CBG ACHS. Recheck A1c every 3 months to 6 months to reevaluate treatment.  ?Hematuria: resolved since patient was treat with Fosfomycin for UTI and taken off Eliquis.  ?DVT: IVC filter placement. Appointment with Vascular and Vein specialist tomorrow, considering that patient no longer on anticoagulation, per family request.  ?Constipation: Stable. Managed with Miralax, Senna S and Colace.   ?Follow up: Palliative care will continue to follow for complex medical decision making, advance care planning, and clarification of goals. Return in 4 weeks or prn. Encouraged to call provider sooner with any concerns.  ?  ?Family /Caregiver/Community Supports: Patient lives at home  with her sister, and children. Strong family support system identified.  ?  ?HOSPICE ELIGIBILITY/DIAGNOSIS: TBD ?  ?Chief Complaint: Follow up visit ?  ?HISTORY OF PRESENT ILLNESS:  Hayley Fisher is a 88 y.o. year old female multiple morbidities requiring close monitoring/management, with high risk of complications and morbidity: recent COVID-19 viral infection, recurrent UTI, hematuria, DVT, Depression, Urinary retention for which she has a Foley in place since June 2022, followed by a Dealer.  Patient is taking of Eliquis due to recurring hematuria.  Follow-up with vascular and vein specialist tomorrow, as patient has IVC filter for  DVT.  History obtained from review of EMR, discussion with primary team, family and/or patient.  Records reviewed and summarized above. All 10 point systems reviewed and are negative except as documented in history of present illness above ?Review and summarization of Epic records shows history from other than patient. Palliative Care was asked to follow this patient o help address complex decision making in the context of advance care planning and goals of care clarification.  Independent interpretation of tests, reviewe as needed, available labs, patient records, imaging, studies and related documents from the EMR.   ? ? ?PERTINENT MEDICATIONS:  ?Outpatient Encounter Medications as of 11/22/2021  ?Medication Sig  ? Accu-Chek Softclix Lancets lancets 1 each by Other route See admin instructions. USE TO TEST BLOOD SUGAR UP  TO 3 TIMES A DAY.  ? atorvastatin (LIPITOR) 40 MG tablet TAKE 1 TABLET BY MOUTH EVERY DAY (Patient taking differently: Take 40 mg by mouth daily.)  ? brimonidine (ALPHAGAN) 0.2 % ophthalmic solution Place 1 drop into the left eye 2 (two) times daily.  ? carbamide peroxide (DEBROX) 6.5 % OTIC solution Place 2 drops into both ears 2 (two) times daily as needed (ear wax).  ? Catheter Self-Adhesive Urinary MISC 1 patch by Does not apply route daily.  ? cetirizine (ZYRTEC) 10 MG tablet Take 10 mg by mouth daily as needed for allergies.  ? clotrimazole-betamethasone (LOTRISONE) cream Apply 1 application topically 2 (two) times daily as needed (rash/itching).  ? dorzolamide-timolol (COSOPT) 22.3-6.8 MG/ML ophthalmic solution Place 1 drop into both eyes 2 (two) times daily.  ? DULoxetine (CYMBALTA) 60 MG capsule Take 1 capsule (60 mg total) by mouth daily.  ? enalapril (VASOTEC) 20 MG tablet Take 1 tablet (20 mg total) by mouth daily.  ? famotidine (PEPCID) 20 MG tablet Take 1 tablet (20 mg total) by mouth daily.  ? ferrous sulfate 325 (65 FE) MG tablet Take 325 mg by mouth daily with breakfast.  ? GLOBAL EASE INJECT PEN NEEDLES 32G X 4 MM MISC USE 1 SYRINGE DAILY TO INJECT VICTOZA  ? glucose blood  (ACCU-CHEK AVIVA PLUS) test strip 1 each by Other route See admin instructions. USE TO TEST BLOOD SUGAR UP  TO 3 TIMES A DAY.  ? hydrocortisone (ANUSOL-HC) 2.5 % rectal cream Place 1 application rectally 2 (two) times daily as needed for hemorrhoids or anal itching. Please use twice daily x5 days, and then twice daily as needed.  ? insulin glargine (LANTUS SOLOSTAR) 100 UNIT/ML Solostar Pen Inject 5 Units into the skin every morning.  ? Insulin Syringe-Needle U-100 (SURE COMFORT INSULIN SYRINGE) 31G X 5/16" 0.3 ML MISC Inject 1 Syringe into the skin daily.  ? ketoconazole (NIZORAL) 2 % cream Apply 1 application topically daily as needed for irritation.  ? Latanoprostene Bunod (VYZULTA) 0.024 % SOLN Place 1 drop into both eyes at bedtime.  ? levothyroxine (SYNTHROID) 50 MCG tablet Take 1 tablet (50 mcg total) by mouth daily.  ? metFORMIN (GLUCOPHAGE-XR) 500 MG 24 hr tablet Take 2 tablets (1,000 mg total) by mouth daily with breakfast.  ? mirabegron ER (MYRBETRIQ) 25 MG TB24 tablet Take 1 tablet (25 mg total) by mouth daily.  ? Misc. Devices (TRANSFER BOARD) MISC Use as needed for transfer from bed to chair or vice versa  ? Multiple Vitamins-Minerals (PRESERVISION AREDS 2+MULTI VIT) CAPS Take 1 capsule by mouth daily.  ? ondansetron (ZOFRAN ODT) 4 MG disintegrating tablet Take 1 tablet (4 mg total) by mouth every 8 (eight) hours as needed  for nausea.  ? polyethylene glycol powder (GLYCOLAX/MIRALAX) 17 GM/SCOOP powder Take 17 g by mouth in the morning and at bedtime.  ? SARNA lotion Apply 1 application topically 4 (four) times daily as needed for itching.  ? Semaglutide (RYBELSUS) 3 MG TABS Take 3 mg by mouth daily.  ? senna (SENOKOT) 8.6 MG TABS tablet Take 1 tablet (8.6 mg total) by mouth 2 (two) times daily.  ? tamsulosin (FLOMAX) 0.4 MG CAPS capsule Take 0.4 mg by mouth every morning.  ? witch hazel-glycerin (TUCKS) pad Apply 1 application topically 2 (two) times daily as needed for itching.  ? ?No  facility-administered encounter medications on file as of 11/22/2021.  ? ? ?HOSPICE ELIGIBILITY/DIAGNOSIS: TBD ? ?PAST MEDICAL HISTORY:  ?Past Medical History:  ?Diagnosis Date  ? Arthritis   ? Arthritis   ? Cataract

## 2021-11-24 ENCOUNTER — Ambulatory Visit (INDEPENDENT_AMBULATORY_CARE_PROVIDER_SITE_OTHER): Payer: Medicare Other | Admitting: Vascular Surgery

## 2021-11-24 ENCOUNTER — Encounter: Payer: Self-pay | Admitting: Vascular Surgery

## 2021-11-24 VITALS — BP 157/84 | HR 87 | Temp 97.9°F | Resp 20 | Ht 63.0 in | Wt 147.0 lb

## 2021-11-24 DIAGNOSIS — Z95828 Presence of other vascular implants and grafts: Secondary | ICD-10-CM | POA: Diagnosis not present

## 2021-11-24 DIAGNOSIS — M7989 Other specified soft tissue disorders: Secondary | ICD-10-CM

## 2021-11-24 NOTE — Progress Notes (Signed)
? ?Patient ID: Hayley Fisher, female   DOB: 10-Nov-1930, 88 y.o.   MRN: [id number] ? ?Reason for Consult: New Patient (Initial Visit) ?  ?Referred by Hayley Resides, MD ? ?Subjective:  ?   ?HPI: ? ?Hayley Fisher is a 88 y.o. female history of IVC filter placement by interventional radiology.  This was considered permanent at the time of placement last May.  More recently she was admitted with hematuria she was taken off anticoagulation.  She is not on any antiplatelet agents.  She does have some periumbilical pain from time to time with chronic catheter in place.  She has some swelling in her right lower extremity with recent diagnosis of DVT.  She is minimally ambulatory mostly sits in a chair. ? ?Past Medical History:  ?Diagnosis Date  ? Arthritis   ? Arthritis   ? Cataract   ? bil cateracts removed  ? Chronic kidney disease   ? "spot on one of my kidneys" per pt  ? Diabetes mellitus   ? Glaucoma   ? Hyperkalemia 07/02/2020  ? Hyperlipidemia   ? Hyperplastic colon polyp   ? Hypertension   ? Hypothyroidism 12/09/2020  ? Metabolic acidosis 40/97/3532  ? Syncope   ? Thyroid disease   ? ?Family History  ?Problem Relation Age of Onset  ? Colon cancer Mother   ? Other Father   ?     cerebral hemorrhage  ? Diabetes Brother   ? Diabetes Sister   ?     x 2  ? Bone cancer Daughter   ? Breast cancer Daughter 5  ? Esophageal cancer Neg Hx   ? Rectal cancer Neg Hx   ? Stomach cancer Neg Hx   ? ?Past Surgical History:  ?Procedure Laterality Date  ? ABDOMINAL HYSTERECTOMY    ? BLADDER SUSPENSION    ? bladder tacking    ? COLONOSCOPY    ? EYE SURGERY    ? INGUINAL HERNIA REPAIR Right   ? IR IVC FILTER PLMT / S&I /IMG GUID/MOD SED  01/16/2021  ? ? ?Short Social History:  ?Social History  ? ?Tobacco Use  ? Smoking status: Former  ?  Types: Cigarettes  ?  Quit date: 11/29/1980  ?  Years since quitting: 41.0  ? Smokeless tobacco: Never  ?Substance Use Topics  ? Alcohol use: No  ?  Alcohol/week: 0.0 standard drinks  ? ? ?Allergies   ?Allergen Reactions  ? Tramadol Itching  ?  Takes occasionally with benadryl  ? Levemir [Insulin Detemir] Itching  ? ? ?Current Outpatient Medications  ?Medication Sig Dispense Refill  ? Accu-Chek Softclix Lancets lancets 1 each by Other route See admin instructions. USE TO TEST BLOOD SUGAR UP  TO 3 TIMES A DAY.    ? atorvastatin (LIPITOR) 40 MG tablet TAKE 1 TABLET BY MOUTH EVERY DAY (Patient taking differently: Take 40 mg by mouth daily.) 90 tablet 3  ? brimonidine (ALPHAGAN) 0.2 % ophthalmic solution Place 1 drop into the left eye 2 (two) times daily.    ? carbamide peroxide (DEBROX) 6.5 % OTIC solution Place 2 drops into both ears 2 (two) times daily as needed (ear wax).    ? Catheter Self-Adhesive Urinary MISC 1 patch by Does not apply route daily. 12 each 0  ? cetirizine (ZYRTEC) 10 MG tablet Take 10 mg by mouth daily as needed for allergies.    ? clotrimazole-betamethasone (LOTRISONE) cream Apply 1 application topically 2 (two) times daily as needed (rash/itching).    ?  dorzolamide-timolol (COSOPT) 22.3-6.8 MG/ML ophthalmic solution Place 1 drop into both eyes 2 (two) times daily.    ? DULoxetine (CYMBALTA) 60 MG capsule Take 1 capsule (60 mg total) by mouth daily.    ? enalapril (VASOTEC) 20 MG tablet Take 1 tablet (20 mg total) by mouth daily. 90 tablet 3  ? famotidine (PEPCID) 20 MG tablet Take 1 tablet (20 mg total) by mouth daily. 60 tablet 0  ? ferrous sulfate 325 (65 FE) MG tablet Take 325 mg by mouth daily with breakfast.    ? GLOBAL EASE INJECT PEN NEEDLES 32G X 4 MM MISC USE 1 SYRINGE DAILY TO INJECT VICTOZA 100 each 0  ? glucose blood (ACCU-CHEK AVIVA PLUS) test strip 1 each by Other route See admin instructions. USE TO TEST BLOOD SUGAR UP  TO 3 TIMES A DAY.    ? hydrocortisone (ANUSOL-HC) 2.5 % rectal cream Place 1 application rectally 2 (two) times daily as needed for hemorrhoids or anal itching. Please use twice daily x5 days, and then twice daily as needed. 30 g 0  ? insulin glargine (LANTUS  SOLOSTAR) 100 UNIT/ML Solostar Pen Inject 5 Units into the skin every morning. 15 mL 1  ? Insulin Syringe-Needle U-100 (SURE COMFORT INSULIN SYRINGE) 31G X 5/16" 0.3 ML MISC Inject 1 Syringe into the skin daily.    ? ketoconazole (NIZORAL) 2 % cream Apply 1 application topically daily as needed for irritation.    ? Latanoprostene Bunod (VYZULTA) 0.024 % SOLN Place 1 drop into both eyes at bedtime.    ? levothyroxine (SYNTHROID) 50 MCG tablet Take 1 tablet (50 mcg total) by mouth daily. 90 tablet 3  ? metFORMIN (GLUCOPHAGE-XR) 500 MG 24 hr tablet Take 2 tablets (1,000 mg total) by mouth daily with breakfast. 180 tablet 3  ? mirabegron ER (MYRBETRIQ) 25 MG TB24 tablet Take 1 tablet (25 mg total) by mouth daily. 30 tablet 1  ? Misc. Devices (TRANSFER BOARD) MISC Use as needed for transfer from bed to chair or vice versa 1 each 0  ? Multiple Vitamins-Minerals (PRESERVISION AREDS 2+MULTI VIT) CAPS Take 1 capsule by mouth daily.    ? ondansetron (ZOFRAN ODT) 4 MG disintegrating tablet Take 1 tablet (4 mg total) by mouth every 8 (eight) hours as needed for nausea. 10 tablet 0  ? polyethylene glycol powder (GLYCOLAX/MIRALAX) 17 GM/SCOOP powder Take 17 g by mouth in the morning and at bedtime. 238 g 3  ? SARNA lotion Apply 1 application topically 4 (four) times daily as needed for itching.    ? Semaglutide (RYBELSUS) 3 MG TABS Take 3 mg by mouth daily. 30 tablet 11  ? senna (SENOKOT) 8.6 MG TABS tablet Take 1 tablet (8.6 mg total) by mouth 2 (two) times daily. 120 tablet 0  ? tamsulosin (FLOMAX) 0.4 MG CAPS capsule Take 0.4 mg by mouth every morning.    ? witch hazel-glycerin (TUCKS) pad Apply 1 application topically 2 (two) times daily as needed for itching.    ? ?No current facility-administered medications for this visit.  ? ? ?Review of Systems  ?Constitutional:  Constitutional negative. ?HENT: HENT negative.  ?Eyes: Eyes negative.  ?Cardiovascular: Positive for leg swelling.  ?GI: Gastrointestinal negative.  ?GU:  Positive for hematuria.  ?Skin: Skin negative.  ?Neurological: Neurological negative. ?Hematologic: Hematologic/lymphatic negative.  ?Psychiatric: Psychiatric negative.   ? ?   ?Objective:  ?Objective  ? ?Vitals:  ? 11/24/21 1425  ?BP: (!) 157/84  ?Pulse: 87  ?Resp: 20  ?Temp: 97.9 ?  F (36.6 ?C)  ?SpO2: 94%  ?Weight: 147 lb (66.7 kg)  ?Height: '5\' 3"'$  (1.6 m)  ? ?Body mass index is 26.04 kg/m?. ? ?Physical Exam ?HENT:  ?   Head: Normocephalic.  ?   Nose: Nose normal.  ?Eyes:  ?   Pupils: Pupils are equal, round, and reactive to light.  ?Cardiovascular:  ?   Rate and Rhythm: Normal rate.  ?Pulmonary:  ?   Effort: Pulmonary effort is normal.  ?Abdominal:  ?   General: Abdomen is flat.  ?   Palpations: Abdomen is soft.  ?Musculoskeletal:  ?   Right lower leg: Edema present.  ?   Left lower leg: Edema present.  ?Skin: ?   General: Skin is warm.  ?   Capillary Refill: Capillary refill takes less than 2 seconds.  ?Neurological:  ?   General: No focal deficit present.  ?   Mental Status: She is alert.  ?Psychiatric:     ?   Mood and Affect: Mood normal.     ?   Behavior: Behavior normal.     ?   Thought Content: Thought content normal.     ?   Judgment: Judgment normal.  ? ? ?Data: ?I reviewed previous right lower extremity venous duplex which demonstrates large volume thrombus right lower extremity. ?    ?Assessment/Plan:  ?  ?88 year old female with history of IVC filter placement recently taken off anticoagulation due to hematuria.  I recommended aspirin as tolerated and keeping the filter lifelong given her underlying issues.  She can see me on an as-needed basis. ? ?  ? ?Waynetta Sandy MD ?[facility] ? ? ?

## 2021-11-29 ENCOUNTER — Other Ambulatory Visit: Payer: Self-pay | Admitting: Endocrinology

## 2021-11-29 ENCOUNTER — Other Ambulatory Visit: Payer: Self-pay | Admitting: Student

## 2021-11-29 DIAGNOSIS — K219 Gastro-esophageal reflux disease without esophagitis: Secondary | ICD-10-CM

## 2021-11-29 DIAGNOSIS — R350 Frequency of micturition: Secondary | ICD-10-CM

## 2021-12-09 ENCOUNTER — Ambulatory Visit: Payer: Medicare Other | Admitting: Endocrinology

## 2021-12-14 ENCOUNTER — Other Ambulatory Visit: Payer: Self-pay | Admitting: Student

## 2021-12-14 ENCOUNTER — Other Ambulatory Visit: Payer: Self-pay | Admitting: Endocrinology

## 2021-12-14 DIAGNOSIS — F419 Anxiety disorder, unspecified: Secondary | ICD-10-CM

## 2021-12-14 DIAGNOSIS — R3914 Feeling of incomplete bladder emptying: Secondary | ICD-10-CM | POA: Diagnosis not present

## 2021-12-16 ENCOUNTER — Ambulatory Visit (INDEPENDENT_AMBULATORY_CARE_PROVIDER_SITE_OTHER): Payer: Medicare Other | Admitting: Endocrinology

## 2021-12-16 VITALS — BP 138/86 | HR 87 | Ht 63.0 in

## 2021-12-16 DIAGNOSIS — Z794 Long term (current) use of insulin: Secondary | ICD-10-CM | POA: Diagnosis not present

## 2021-12-16 DIAGNOSIS — M48061 Spinal stenosis, lumbar region without neurogenic claudication: Secondary | ICD-10-CM | POA: Diagnosis not present

## 2021-12-16 DIAGNOSIS — R319 Hematuria, unspecified: Secondary | ICD-10-CM

## 2021-12-16 DIAGNOSIS — M6281 Muscle weakness (generalized): Secondary | ICD-10-CM | POA: Diagnosis not present

## 2021-12-16 DIAGNOSIS — E1140 Type 2 diabetes mellitus with diabetic neuropathy, unspecified: Secondary | ICD-10-CM | POA: Diagnosis not present

## 2021-12-16 LAB — POCT GLYCOSYLATED HEMOGLOBIN (HGB A1C): Hemoglobin A1C: 6.7 % — AB (ref 4.0–5.6)

## 2021-12-16 LAB — URINALYSIS, ROUTINE W REFLEX MICROSCOPIC
Bilirubin Urine: NEGATIVE
Ketones, ur: NEGATIVE
Nitrite: NEGATIVE
Specific Gravity, Urine: 1.015 (ref 1.000–1.030)
Urine Glucose: >=1000 — AB
Urobilinogen, UA: 0.2 (ref 0.0–1.0)
pH: 6.5 (ref 5.0–8.0)

## 2021-12-16 NOTE — Patient Instructions (Addendum)
check your blood sugar 4 times a day.  vary the time of day when you check, between before the 3 meals, and at bedtime.  also check if you have symptoms of your blood sugar being too high or too low.  please keep a record of the readings and bring it to your next appointment here (or you can bring the meter itself).  You can write it on any piece of paper.  please call us sooner if your blood sugar goes below 70, or if most of your readings are over 200.   ?Please reduce the insulin to 5 mg each morning.   ?Please continue the same other medications.   ?A urine test is requested for you today.  We'll send the result to urology.  Please follow up with them.   ?You should have an endocrinology follow-up appointment in 2 months.   ? ? ?

## 2021-12-16 NOTE — Progress Notes (Signed)
? ?Subjective:  ? ? Patient ID: Hayley Fisher, female    DOB: 21-Aug-1931, 88 y.o.   MRN: [id number] ? ?HPI ?Pt returns for f/u of diabetes mellitus:  ?DM type: Insulin-requiring type 2  ?Dx'ed: 2000 ?Complications: DR and PN  ?Therapy: insulin since 2021, and 2 oral meds.  ?GDM: never ?DKA: never ?Severe hypoglycemia: never ?Pancreatitis: never ?Pancreatic imaging: normal on 2021 CT.   ?SDOH: dtr Elmyra Ricks gives pt her insulin, and checks pt's cbg.   ?Other: She prefers vial over pen.   ?Interval history: no cbg Fisher, but states cbg's varies from 98-254.  It is in general higher as the day goes on.  Pt is here with [name].  Dtr Richrd Sox is on the phone.  she re-increased insulin to 10 units qd, due to cbg's being intermittently over 200.   ?Pt also has chronic primary hypothyroidism (she has never had thyroid imaging).   ?Dtr Richrd Sox request that a UA be done, and result sent to Urol.  No fever ?Past Medical History:  ?Diagnosis Date  ? Arthritis   ? Arthritis   ? Cataract   ? bil cateracts removed  ? Chronic kidney disease   ? "spot on one of my kidneys" per pt  ? Diabetes mellitus   ? Glaucoma   ? Hyperkalemia 07/02/2020  ? Hyperlipidemia   ? Hyperplastic colon polyp   ? Hypertension   ? Hypothyroidism 12/09/2020  ? Metabolic acidosis 22/29/7989  ? Syncope   ? Thyroid disease   ? ? ?Past Surgical History:  ?Procedure Laterality Date  ? ABDOMINAL HYSTERECTOMY    ? BLADDER SUSPENSION    ? bladder tacking    ? COLONOSCOPY    ? EYE SURGERY    ? INGUINAL HERNIA REPAIR Right   ? IR IVC FILTER PLMT / S&I /IMG GUID/MOD SED  01/16/2021  ? ? ?Social History  ? ?Socioeconomic History  ? Marital status: Single  ?  Spouse name: Not on file  ? Number of children: 4  ? Years of education: Not on file  ? Highest education level: Not on file  ?Occupational History  ? Occupation: retired  ?Tobacco Use  ? Smoking status: Former  ?  Types: Cigarettes  ?  Quit date: 11/29/1980  ?  Years since quitting: 41.0  ? Smokeless  tobacco: Never  ?Vaping Use  ? Vaping Use: Never used  ?Substance and Sexual Activity  ? Alcohol use: No  ?  Alcohol/week: 0.0 standard drinks  ? Drug use: No  ? Sexual activity: Yes  ?  Birth control/protection: None  ?Other Topics Concern  ? Not on file  ?Social History Narrative  ? Not on file  ? ?Social Determinants of Health  ? ?Financial Resource Strain: Not on file  ?Food Insecurity: Not on file  ?Transportation Needs: Not on file  ?Physical Activity: Not on file  ?Stress: Not on file  ?Social Connections: Not on file  ?Intimate Partner Violence: Not on file  ? ? ?Current Outpatient Medications on File Prior to Visit  ?Medication Sig Dispense Refill  ? Accu-Chek Softclix Lancets lancets 1 each by Other route See admin instructions. USE TO TEST BLOOD SUGAR UP  TO 3 TIMES A DAY.    ? atorvastatin (LIPITOR) 40 MG tablet TAKE 1 TABLET BY MOUTH EVERY DAY (Patient taking differently: Take 40 mg by mouth daily.) 90 tablet 3  ? brimonidine (ALPHAGAN) 0.2 % ophthalmic solution Place 1 drop into the left eye 2 (two) times daily.    ?  carbamide peroxide (DEBROX) 6.5 % OTIC solution Place 2 drops into both ears 2 (two) times daily as needed (ear wax).    ? Catheter Self-Adhesive Urinary MISC 1 patch by Does not apply route daily. 12 each 0  ? cetirizine (ZYRTEC) 10 MG tablet Take 10 mg by mouth daily as needed for allergies.    ? clotrimazole-betamethasone (LOTRISONE) cream Apply 1 application topically 2 (two) times daily as needed (rash/itching).    ? dorzolamide-timolol (COSOPT) 22.3-6.8 MG/ML ophthalmic solution Place 1 drop into both eyes 2 (two) times daily.    ? DULoxetine (CYMBALTA) 60 MG capsule Take 1 capsule (60 mg total) by mouth daily.    ? enalapril (VASOTEC) 20 MG tablet Take 1 tablet (20 mg total) by mouth daily. 90 tablet 3  ? famotidine (PEPCID) 20 MG tablet TAKE 1 TABLET BY MOUTH EVERY DAY 60 tablet 0  ? ferrous sulfate 325 (65 FE) MG tablet Take 325 mg by mouth daily with breakfast.    ? GLOBAL EASE  INJECT PEN NEEDLES 32G X 4 MM MISC USE 1 SYRINGE DAILY TO INJECT VICTOZA 100 each 0  ? glucose blood (ACCU-CHEK AVIVA PLUS) test strip 1 each by Other route See admin instructions. USE TO TEST BLOOD SUGAR UP  TO 3 TIMES A DAY.    ? hydrocortisone (ANUSOL-HC) 2.5 % rectal cream Place 1 application rectally 2 (two) times daily as needed for hemorrhoids or anal itching. Please use twice daily x5 days, and then twice daily as needed. 30 g 0  ? insulin glargine (LANTUS SOLOSTAR) 100 UNIT/ML Solostar Pen Inject 5 Units into the skin every morning. 15 mL 1  ? Insulin Syringe-Needle U-100 (SURE COMFORT INSULIN SYRINGE) 31G X 5/16" 0.3 ML MISC Inject 1 Syringe into the skin daily.    ? ketoconazole (NIZORAL) 2 % cream Apply 1 application topically daily as needed for irritation.    ? Latanoprostene Bunod (VYZULTA) 0.024 % SOLN Place 1 drop into both eyes at bedtime.    ? levothyroxine (SYNTHROID) 50 MCG tablet TAKE 1 TABLET BY MOUTH EVERY DAY 90 tablet 0  ? metFORMIN (GLUCOPHAGE-XR) 500 MG 24 hr tablet Take 2 tablets (1,000 mg total) by mouth daily with breakfast. 180 tablet 3  ? mirabegron ER (MYRBETRIQ) 25 MG TB24 tablet Take 1 tablet (25 mg total) by mouth daily. 30 tablet 1  ? Misc. Devices (TRANSFER BOARD) MISC Use as needed for transfer from bed to chair or vice versa 1 each 0  ? Multiple Vitamins-Minerals (PRESERVISION AREDS 2+MULTI VIT) CAPS Take 1 capsule by mouth daily.    ? ondansetron (ZOFRAN ODT) 4 MG disintegrating tablet Take 1 tablet (4 mg total) by mouth every 8 (eight) hours as needed for nausea. 10 tablet 0  ? polyethylene glycol powder (GLYCOLAX/MIRALAX) 17 GM/SCOOP powder Take 17 g by mouth in the morning and at bedtime. 238 g 3  ? RYBELSUS 3 MG TABS TAKE 1 TABLET BY MOUTH EVERY DAY 30 tablet 11  ? SARNA lotion Apply 1 application topically 4 (four) times daily as needed for itching.    ? senna (SENOKOT) 8.6 MG TABS tablet Take 1 tablet (8.6 mg total) by mouth 2 (two) times daily. 120 tablet 0  ?  tamsulosin (FLOMAX) 0.4 MG CAPS capsule Take 0.4 mg by mouth every morning.    ? witch hazel-glycerin (TUCKS) pad Apply 1 application topically 2 (two) times daily as needed for itching.    ? ?No current facility-administered medications on file prior to visit.  ? ? ?  Allergies  ?Allergen Reactions  ? Tramadol Itching  ?  Takes occasionally with benadryl  ? Levemir [Insulin Detemir] Itching  ? ? ?Family History  ?Problem Relation Age of Onset  ? Colon cancer Mother   ? Other Father   ?     cerebral hemorrhage  ? Diabetes Brother   ? Diabetes Sister   ?     x 2  ? Bone cancer Daughter   ? Breast cancer Daughter 49  ? Esophageal cancer Neg Hx   ? Rectal cancer Neg Hx   ? Stomach cancer Neg Hx   ? ? ?BP 138/86 (BP Location: Left Arm, Patient Position: Sitting, Cuff Size: Normal)   Pulse 87   Ht '5\' 3"'$  (1.6 m)   SpO2 95%   BMI 26.04 kg/m?  ? ? ?Review of Systems ? ?   ?Objective:  ? Physical Exam ?VITAL SIGNS:  See vs page ?GENERAL: no distress.  In wheelchair. ? ?Lab Results  ?Component Value Date  ? CREATININE 0.84 11/06/2021  ? BUN 17 11/06/2021  ? NA 137 11/06/2021  ? K 4.0 11/06/2021  ? CL 103 11/06/2021  ? CO2 26 11/06/2021  ? ?Lab Results  ?Component Value Date  ? TSH 4.27 09/30/2021  ? ? ?Lab Results  ?Component Value Date  ? HGBA1C 6.7 (A) 12/16/2021  ? ?   ?Assessment & Plan:  ?Type 2 DM: overcontrolled.  We discussed need to reduce insulin, for safety reasons.   ? ?Patient Instructions  ?check your blood sugar 4 times a day.  vary the time of day when you check, between before the 3 meals, and at bedtime.  also check if you have symptoms of your blood sugar being too high or too low.  please keep a Fisher of the readings and bring it to your next appointment here (or you can bring the meter itself).  You can write it on any piece of paper.  please call us sooner if your blood sugar goes below 70, or if most of your readings are over 200.   ?Please reduce the insulin to 5 mg each morning.   ?Please continue  the same other medications.   ?A urine test is requested for you today.  We'll send the result to urology.  Please follow up with them.   ?You should have an endocrinology follow-up appointment in 2 months.   ?

## 2021-12-20 DIAGNOSIS — R531 Weakness: Secondary | ICD-10-CM | POA: Diagnosis not present

## 2021-12-30 ENCOUNTER — Other Ambulatory Visit: Payer: Self-pay | Admitting: Student

## 2021-12-30 DIAGNOSIS — K219 Gastro-esophageal reflux disease without esophagitis: Secondary | ICD-10-CM

## 2021-12-31 ENCOUNTER — Other Ambulatory Visit: Payer: Self-pay

## 2021-12-31 DIAGNOSIS — E113312 Type 2 diabetes mellitus with moderate nonproliferative diabetic retinopathy with macular edema, left eye: Secondary | ICD-10-CM | POA: Diagnosis not present

## 2021-12-31 DIAGNOSIS — E113311 Type 2 diabetes mellitus with moderate nonproliferative diabetic retinopathy with macular edema, right eye: Secondary | ICD-10-CM | POA: Diagnosis not present

## 2021-12-31 DIAGNOSIS — H401112 Primary open-angle glaucoma, right eye, moderate stage: Secondary | ICD-10-CM | POA: Diagnosis not present

## 2021-12-31 MED ORDER — FERROUS SULFATE 325 (65 FE) MG PO TABS: 325.0000 mg | ORAL_TABLET | Freq: Every day | ORAL | 1 refills | Status: DC

## 2022-01-01 DIAGNOSIS — E1140 Type 2 diabetes mellitus with diabetic neuropathy, unspecified: Secondary | ICD-10-CM | POA: Diagnosis not present

## 2022-01-01 DIAGNOSIS — E1169 Type 2 diabetes mellitus with other specified complication: Secondary | ICD-10-CM | POA: Diagnosis not present

## 2022-01-03 ENCOUNTER — Other Ambulatory Visit: Payer: Medicare Other | Admitting: Hospice

## 2022-01-03 ENCOUNTER — Other Ambulatory Visit: Payer: Self-pay | Admitting: Student

## 2022-01-03 DIAGNOSIS — I82402 Acute embolism and thrombosis of unspecified deep veins of left lower extremity: Secondary | ICD-10-CM | POA: Diagnosis not present

## 2022-01-03 DIAGNOSIS — Z515 Encounter for palliative care: Secondary | ICD-10-CM

## 2022-01-03 DIAGNOSIS — E1140 Type 2 diabetes mellitus with diabetic neuropathy, unspecified: Secondary | ICD-10-CM | POA: Diagnosis not present

## 2022-01-03 DIAGNOSIS — Z794 Long term (current) use of insulin: Secondary | ICD-10-CM | POA: Diagnosis not present

## 2022-01-03 DIAGNOSIS — R350 Frequency of micturition: Secondary | ICD-10-CM

## 2022-01-03 DIAGNOSIS — K5901 Slow transit constipation: Secondary | ICD-10-CM | POA: Diagnosis not present

## 2022-01-03 DIAGNOSIS — E1169 Type 2 diabetes mellitus with other specified complication: Secondary | ICD-10-CM | POA: Diagnosis not present

## 2022-01-03 NOTE — Progress Notes (Signed)
? ? ?Manufacturing engineer ?Community Palliative Care Consult Note ?Telephone: [phone]  ?Fax: [phone] ? ?PATIENT NAME: Hayley Fisher ?DOB: [birth year] ?MRN: [id number] ? ?PRIMARY CARE PROVIDER:   [name], MD ?[name], MD ?[address] ?[place],  [place] [address] ? ?REFERRING PROVIDER: [name], MD ?[name], MD ?[address] ?[place],  [place] [address] ? ?RESPONSIBLE PARTY:   Self/Jackie ?[place] - grand daughter ?Hayley Fisher is home with patient [phone] ?Contact Information   ? ? Name Relation Home Work Mobile  ? Randson,Jackie Daughter [phone]  [phone]  ? Cornelius,LaLeisa Daughter   [phone]  ? [name] Sister [phone]    ? ?  ? ?TELEHEALTH VISIT STATEMENT ?Due to the COVID-19 crisis, this visit was done via telemedicine from my office and it was initiated and consent by this patient and or family. Video-audio (telehealth) contact was unable to be done due to technical barriers from the patient?s side. ?I connected with patient OR PROXY by a telephone  and verified that I am speaking with the correct person. I discussed the limitations of evaluation and management by telemedicine. The patient expressed understanding and agreed to proceed.  ?Visit is to build trust and highlight Palliative Medicine as specialized medical care for people living with serious illness, aimed at facilitating better quality of life through symptoms relief, assisting with advance care planning and complex medical decision making. Hayley Fisher is home with patient during visit and provided patient updates  This is a follow up visit. ? ?RECOMMENDATIONS/PLAN:  ? ?Advance Care Planning/Code Status: Patient is a Partial code - wants chest compressions, ACLS meds, no mechanical ventilation.  ? ?Goals of Care: Goals of care include to maximize quality of life and symptom management. MOST form was  earlier given to Hayley Fisher to further explore goals of care.  ?Patient was a hospice  patient and discharged from service.  ? ?Visit consisted of counseling and education dealing with the complex and emotionally intense issues of symptom management and palliative care in the setting of serious and potentially life-threatening illness. Palliative care team will continue to support patient, patient's family, and medical team. ? ?Symptom Management/Plan: ?Type 2 DM with Neuropathy: Continue Metformin, Insulin. Current A1c is 6.7, down from A1c 8.0 06/10/2021. No concentrated sweets. CBG ACHS.  Daughter reports endocrinologist instructed to reduce insulin  from 10 units to 5 units on a trial to taper off Insulin.  Compliance and monitoring CBG encouraged. Recheck A1c in 3 months to evaluate treatment.  ?DVT: IVC filter placement in R leg.  Recent f/u appointment with Vascular and Vein specialist anticoagulation; no anticoagulation per family request-  caused hematuria. Elevate BLE extremites to promote circulation.  Follow-up with vascular and vein specialist as planned. ?Constipation: Stable. Managed with Miralax, Senna S.   ?Follow up: Palliative care will continue to follow for complex medical decision making, advance care planning, and clarification of goals. Return in 6 weeks or prn. ?Hayley Fisher elects video call for next follow-up.  Encouraged to call provider sooner with any concerns.  ?   ?Family /Caregiver/Community Supports: Patient lives at home  with her sister, and children. Strong family support system identified.  ?  ?HOSPICE ELIGIBILITY/DIAGNOSIS: TBD ?  ?Chief Complaint: Follow up visit ?  ?HISTORY OF PRESENT ILLNESS:  Hayley Fisher is a 88 y.o. year old female multiple morbidities requiring close monitoring/management, with high risk of complications and morbidity: recent COVID-19 viral infection, recurrent UTI, hematuria, DVT, Depression, Urinary retention for which she has a Foley in  place since June 2022, followed by a Urologist.  Patient is taking off Eliquis due to recurring  hematuria.  Patient denied pain/discomfort, no shortness of breath.  History obtained from review of EMR, discussion with primary team, family and/or patient. Records reviewed and summarized above. All 10 point systems reviewed and are negative except as documented in history of present illness above ?Review and summarization of Epic records shows history from other than patient. Palliative Care was asked to follow this patient o help address complex decision making in the context of advance care planning and goals of care clarification.  I reviewed as needed, available labs, patient records, imaging, studies and related documents from the EMR.   ? ? ?PERTINENT MEDICATIONS:  ?Outpatient Encounter Medications as of 01/03/2022  ?Medication Sig  ? Accu-Chek Softclix Lancets lancets 1 each by Other route See admin instructions. USE TO TEST BLOOD SUGAR UP  TO 3 TIMES A DAY.  ? atorvastatin (LIPITOR) 40 MG tablet TAKE 1 TABLET BY MOUTH EVERY DAY (Patient taking differently: Take 40 mg by mouth daily.)  ? brimonidine (ALPHAGAN) 0.2 % ophthalmic solution Place 1 drop into the left eye 2 (two) times daily.  ? carbamide peroxide (DEBROX) 6.5 % OTIC solution Place 2 drops into both ears 2 (two) times daily as needed (ear wax).  ? Catheter Self-Adhesive Urinary MISC 1 patch by Does not apply route daily.  ? cetirizine (ZYRTEC) 10 MG tablet Take 10 mg by mouth daily as needed for allergies.  ? clotrimazole-betamethasone (LOTRISONE) cream Apply 1 application topically 2 (two) times daily as needed (rash/itching).  ? dorzolamide-timolol (COSOPT) 22.3-6.8 MG/ML ophthalmic solution Place 1 drop into both eyes 2 (two) times daily.  ? DULoxetine (CYMBALTA) 60 MG capsule Take 1 capsule (60 mg total) by mouth daily.  ? enalapril (VASOTEC) 20 MG tablet Take 1 tablet (20 mg total) by mouth daily.  ? famotidine (PEPCID) 20 MG tablet TAKE 1 TABLET BY MOUTH EVERY DAY  ? ferrous sulfate 325 (65 FE) MG tablet Take 1 tablet (325 mg total) by  mouth daily with breakfast.  ? GLOBAL EASE INJECT PEN NEEDLES 32G X 4 MM MISC USE 1 SYRINGE DAILY TO INJECT VICTOZA  ? glucose blood (ACCU-CHEK AVIVA PLUS) test strip 1 each by Other route See admin instructions. USE TO TEST BLOOD SUGAR UP  TO 3 TIMES A DAY.  ? hydrocortisone (ANUSOL-HC) 2.5 % rectal cream Place 1 application rectally 2 (two) times daily as needed for hemorrhoids or anal itching. Please use twice daily x5 days, and then twice daily as needed.  ? insulin glargine (LANTUS SOLOSTAR) 100 UNIT/ML Solostar Pen Inject 5 Units into the skin every morning.  ? Insulin Syringe-Needle U-100 (SURE COMFORT INSULIN SYRINGE) 31G X 5/16" 0.3 ML MISC Inject 1 Syringe into the skin daily.  ? ketoconazole (NIZORAL) 2 % cream Apply 1 application topically daily as needed for irritation.  ? Latanoprostene Bunod (VYZULTA) 0.024 % SOLN Place 1 drop into both eyes at bedtime.  ? levothyroxine (SYNTHROID) 50 MCG tablet TAKE 1 TABLET BY MOUTH EVERY DAY  ? metFORMIN (GLUCOPHAGE-XR) 500 MG 24 hr tablet Take 2 tablets (1,000 mg total) by mouth daily with breakfast.  ? mirabegron ER (MYRBETRIQ) 25 MG TB24 tablet Take 1 tablet (25 mg total) by mouth daily.  ? Misc. Devices (TRANSFER BOARD) MISC Use as needed for transfer from bed to chair or vice versa  ? Multiple Vitamins-Minerals (PRESERVISION AREDS 2+MULTI VIT) CAPS Take 1 capsule by mouth daily.  ?  ondansetron (ZOFRAN ODT) 4 MG disintegrating tablet Take 1 tablet (4 mg total) by mouth every 8 (eight) hours as needed for nausea.  ? polyethylene glycol powder (GLYCOLAX/MIRALAX) 17 GM/SCOOP powder Take 17 g by mouth in the morning and at bedtime.  ? RYBELSUS 3 MG TABS TAKE 1 TABLET BY MOUTH EVERY DAY  ? SARNA lotion Apply 1 application topically 4 (four) times daily as needed for itching.  ? senna (SENOKOT) 8.6 MG TABS tablet Take 1 tablet (8.6 mg total) by mouth 2 (two) times daily.  ? tamsulosin (FLOMAX) 0.4 MG CAPS capsule Take 0.4 mg by mouth every morning.  ? witch  hazel-glycerin (TUCKS) pad Apply 1 application topically 2 (two) times daily as needed for itching.  ? ?No facility-administered encounter medications on file as of 01/03/2022.  ? ? ?HOSPICE ELIGIBILITY/DIAGNOSIS: TBD ? ?PAS

## 2022-01-04 DIAGNOSIS — E1140 Type 2 diabetes mellitus with diabetic neuropathy, unspecified: Secondary | ICD-10-CM | POA: Diagnosis not present

## 2022-01-04 DIAGNOSIS — E1169 Type 2 diabetes mellitus with other specified complication: Secondary | ICD-10-CM | POA: Diagnosis not present

## 2022-01-05 DIAGNOSIS — E1140 Type 2 diabetes mellitus with diabetic neuropathy, unspecified: Secondary | ICD-10-CM | POA: Diagnosis not present

## 2022-01-05 DIAGNOSIS — E1169 Type 2 diabetes mellitus with other specified complication: Secondary | ICD-10-CM | POA: Diagnosis not present

## 2022-01-06 DIAGNOSIS — E1140 Type 2 diabetes mellitus with diabetic neuropathy, unspecified: Secondary | ICD-10-CM | POA: Diagnosis not present

## 2022-01-06 DIAGNOSIS — E1169 Type 2 diabetes mellitus with other specified complication: Secondary | ICD-10-CM | POA: Diagnosis not present

## 2022-01-07 DIAGNOSIS — E1169 Type 2 diabetes mellitus with other specified complication: Secondary | ICD-10-CM | POA: Diagnosis not present

## 2022-01-07 DIAGNOSIS — E1140 Type 2 diabetes mellitus with diabetic neuropathy, unspecified: Secondary | ICD-10-CM | POA: Diagnosis not present

## 2022-01-08 DIAGNOSIS — E1169 Type 2 diabetes mellitus with other specified complication: Secondary | ICD-10-CM | POA: Diagnosis not present

## 2022-01-08 DIAGNOSIS — E1140 Type 2 diabetes mellitus with diabetic neuropathy, unspecified: Secondary | ICD-10-CM | POA: Diagnosis not present

## 2022-01-10 DIAGNOSIS — E1169 Type 2 diabetes mellitus with other specified complication: Secondary | ICD-10-CM | POA: Diagnosis not present

## 2022-01-10 DIAGNOSIS — E1140 Type 2 diabetes mellitus with diabetic neuropathy, unspecified: Secondary | ICD-10-CM | POA: Diagnosis not present

## 2022-01-11 ENCOUNTER — Encounter: Payer: Self-pay | Admitting: Podiatry

## 2022-01-11 ENCOUNTER — Ambulatory Visit (INDEPENDENT_AMBULATORY_CARE_PROVIDER_SITE_OTHER): Payer: Medicare Other | Admitting: Podiatry

## 2022-01-11 DIAGNOSIS — E1140 Type 2 diabetes mellitus with diabetic neuropathy, unspecified: Secondary | ICD-10-CM | POA: Diagnosis not present

## 2022-01-11 DIAGNOSIS — Z794 Long term (current) use of insulin: Secondary | ICD-10-CM | POA: Diagnosis not present

## 2022-01-11 DIAGNOSIS — L03032 Cellulitis of left toe: Secondary | ICD-10-CM

## 2022-01-11 DIAGNOSIS — B351 Tinea unguium: Secondary | ICD-10-CM | POA: Diagnosis not present

## 2022-01-11 DIAGNOSIS — R3914 Feeling of incomplete bladder emptying: Secondary | ICD-10-CM | POA: Diagnosis not present

## 2022-01-11 DIAGNOSIS — E1169 Type 2 diabetes mellitus with other specified complication: Secondary | ICD-10-CM | POA: Diagnosis not present

## 2022-01-12 DIAGNOSIS — E1140 Type 2 diabetes mellitus with diabetic neuropathy, unspecified: Secondary | ICD-10-CM | POA: Diagnosis not present

## 2022-01-12 DIAGNOSIS — E1169 Type 2 diabetes mellitus with other specified complication: Secondary | ICD-10-CM | POA: Diagnosis not present

## 2022-01-12 NOTE — Progress Notes (Signed)
  Subjective:  Patient ID: Hayley Fisher, female    DOB: [birth year],  MRN: [id number]  Hayley Fisher presents to clinic today for painful elongated mycotic toenails 1-5 bilaterally which are tender when wearing enclosed shoe gear. Pain is relieved with periodic professional debridement.  Patient is accompanied by her daughter, Mrs. Hayley Fisher, on today's visit. Patient had a pedicure done.  She is diabetic and has h/o DVT. She has IVC filter in place.Followed by Dr. Loanne Drilling in Endocrinology and Dr. Donzetta Matters in Vascular Surgery.  New problem(s): None.   PCP is [name], MD , and last visit was November 02, 2021. She was seen for LE swelling on last visit.  Allergies  Allergen Reactions   Tramadol Itching    Takes occasionally with benadryl   Levemir [Insulin Detemir] Itching    Review of Systems: Negative except as noted in the HPI.  Objective:   Objective:  There were no vitals filed for this visit. Constitutional Patient is a pleasant 88 y.o. African American female WD, WN in NAD. AAO x 3.  Vascular CFT <4 seconds b/l LE. Palpable DP pulse(s) b/l LE. Palpable PT pulse(s) b/l LE. Pedal hair absent. No pain with calf compression b/l. Lower extremity skin temperature gradient within normal limits. Trace edema noted BLE. Evidence of chronic venous insufficiency b/l LE. No ischemia or gangrene noted b/l LE. No cyanosis or clubbing noted b/l LE. No cyanosis or clubbing noted.  Neurologic Normal speech. Protective sensation diminished with 10g monofilament b/l.  Dermatologic Pedal skin is warm and supple b/l LE. No open wounds b/l LE. No interdigital macerations noted b/l LE. Subungual abscess left 2nd toe. There is pinpoint purulent drainage subungually at medial aspect of left 2nd toe.No erythema, no deep space infection noted.  Orthopedic: Muscle strength 5/5 to all lower extremity muscle groups bilaterally. No pain, crepitus or joint limitation noted with ROM bilateral LE. Pes  planus deformity noted bilateral LE. Utilizes wheelchair for mobility assistance.      Latest Ref Rng & Units 12/16/2021    2:24 PM 09/30/2021    2:45 PM 09/09/2021    3:50 PM 06/09/2021   10:57 AM 04/06/2021    2:26 PM  Hemoglobin A1C  Hemoglobin-A1c 4.0 - 5.6 % 6.7   7.3   7.6   8.0   6.3     Assessment:   1. Onychomycosis   2. Subungual abscess of toe of left foot   3. Type 2 diabetes mellitus with diabetic neuropathy, with long-term current use of insulin ([place])    Plan:  -Patient was evaluated and treated. All patient's and/or POA's questions/concerns answered on today's visit. -Left 2nd toe abscess evacuated. Irrigated with hydrogen peroxide. Cleansed with alcohol Triple antibiotic ointment and band-aid applied. Daughter instructed to apply Neosporin once daily for one week. Call office or contact me via MyChart with any questions/concerns. Daughter related understanding. -Continue diabetic foot care principles: inspect feet daily, monitor glucose as recommended by PCP and/or Endocrinologist, and follow prescribed diet per PCP, Endocrinologist and/or dietician. -Toenails 1-5 bilaterally debrided in length and girth without iatrogenic bleeding with sterile nail nipper and dremel.  -Patient/POA to call should there be question/concern in the interim.  Return in about 3 months (around 04/13/2022).  Marzetta Board, DPM

## 2022-01-13 DIAGNOSIS — E1169 Type 2 diabetes mellitus with other specified complication: Secondary | ICD-10-CM | POA: Diagnosis not present

## 2022-01-13 DIAGNOSIS — E1140 Type 2 diabetes mellitus with diabetic neuropathy, unspecified: Secondary | ICD-10-CM | POA: Diagnosis not present

## 2022-01-14 DIAGNOSIS — E1140 Type 2 diabetes mellitus with diabetic neuropathy, unspecified: Secondary | ICD-10-CM | POA: Diagnosis not present

## 2022-01-14 DIAGNOSIS — E1169 Type 2 diabetes mellitus with other specified complication: Secondary | ICD-10-CM | POA: Diagnosis not present

## 2022-01-15 DIAGNOSIS — E1169 Type 2 diabetes mellitus with other specified complication: Secondary | ICD-10-CM | POA: Diagnosis not present

## 2022-01-15 DIAGNOSIS — M48061 Spinal stenosis, lumbar region without neurogenic claudication: Secondary | ICD-10-CM | POA: Diagnosis not present

## 2022-01-15 DIAGNOSIS — E1140 Type 2 diabetes mellitus with diabetic neuropathy, unspecified: Secondary | ICD-10-CM | POA: Diagnosis not present

## 2022-01-15 DIAGNOSIS — M6281 Muscle weakness (generalized): Secondary | ICD-10-CM | POA: Diagnosis not present

## 2022-01-18 DIAGNOSIS — E1140 Type 2 diabetes mellitus with diabetic neuropathy, unspecified: Secondary | ICD-10-CM | POA: Diagnosis not present

## 2022-01-18 DIAGNOSIS — E1169 Type 2 diabetes mellitus with other specified complication: Secondary | ICD-10-CM | POA: Diagnosis not present

## 2022-01-19 DIAGNOSIS — E1140 Type 2 diabetes mellitus with diabetic neuropathy, unspecified: Secondary | ICD-10-CM | POA: Diagnosis not present

## 2022-01-19 DIAGNOSIS — E1169 Type 2 diabetes mellitus with other specified complication: Secondary | ICD-10-CM | POA: Diagnosis not present

## 2022-01-20 DIAGNOSIS — E1169 Type 2 diabetes mellitus with other specified complication: Secondary | ICD-10-CM | POA: Diagnosis not present

## 2022-01-20 DIAGNOSIS — E1140 Type 2 diabetes mellitus with diabetic neuropathy, unspecified: Secondary | ICD-10-CM | POA: Diagnosis not present

## 2022-01-21 DIAGNOSIS — E1169 Type 2 diabetes mellitus with other specified complication: Secondary | ICD-10-CM | POA: Diagnosis not present

## 2022-01-21 DIAGNOSIS — E1140 Type 2 diabetes mellitus with diabetic neuropathy, unspecified: Secondary | ICD-10-CM | POA: Diagnosis not present

## 2022-01-22 DIAGNOSIS — E1169 Type 2 diabetes mellitus with other specified complication: Secondary | ICD-10-CM | POA: Diagnosis not present

## 2022-01-22 DIAGNOSIS — E1140 Type 2 diabetes mellitus with diabetic neuropathy, unspecified: Secondary | ICD-10-CM | POA: Diagnosis not present

## 2022-01-24 ENCOUNTER — Other Ambulatory Visit: Payer: Self-pay

## 2022-01-24 DIAGNOSIS — E1140 Type 2 diabetes mellitus with diabetic neuropathy, unspecified: Secondary | ICD-10-CM

## 2022-01-24 DIAGNOSIS — E1169 Type 2 diabetes mellitus with other specified complication: Secondary | ICD-10-CM | POA: Diagnosis not present

## 2022-01-24 MED ORDER — ACCU-CHEK AVIVA PLUS VI STRP: 1.0000 | ORAL_STRIP | Status: DC

## 2022-01-25 ENCOUNTER — Encounter: Payer: Self-pay | Admitting: *Deleted

## 2022-01-25 DIAGNOSIS — E1140 Type 2 diabetes mellitus with diabetic neuropathy, unspecified: Secondary | ICD-10-CM | POA: Diagnosis not present

## 2022-01-25 DIAGNOSIS — E1169 Type 2 diabetes mellitus with other specified complication: Secondary | ICD-10-CM | POA: Diagnosis not present

## 2022-01-26 DIAGNOSIS — E1169 Type 2 diabetes mellitus with other specified complication: Secondary | ICD-10-CM | POA: Diagnosis not present

## 2022-01-26 DIAGNOSIS — E1140 Type 2 diabetes mellitus with diabetic neuropathy, unspecified: Secondary | ICD-10-CM | POA: Diagnosis not present

## 2022-01-27 DIAGNOSIS — E1169 Type 2 diabetes mellitus with other specified complication: Secondary | ICD-10-CM | POA: Diagnosis not present

## 2022-01-27 DIAGNOSIS — E1140 Type 2 diabetes mellitus with diabetic neuropathy, unspecified: Secondary | ICD-10-CM | POA: Diagnosis not present

## 2022-01-28 DIAGNOSIS — E1140 Type 2 diabetes mellitus with diabetic neuropathy, unspecified: Secondary | ICD-10-CM | POA: Diagnosis not present

## 2022-01-28 DIAGNOSIS — E1169 Type 2 diabetes mellitus with other specified complication: Secondary | ICD-10-CM | POA: Diagnosis not present

## 2022-01-29 DIAGNOSIS — E1169 Type 2 diabetes mellitus with other specified complication: Secondary | ICD-10-CM | POA: Diagnosis not present

## 2022-01-29 DIAGNOSIS — E1140 Type 2 diabetes mellitus with diabetic neuropathy, unspecified: Secondary | ICD-10-CM | POA: Diagnosis not present

## 2022-01-31 DIAGNOSIS — E1140 Type 2 diabetes mellitus with diabetic neuropathy, unspecified: Secondary | ICD-10-CM | POA: Diagnosis not present

## 2022-01-31 DIAGNOSIS — E1169 Type 2 diabetes mellitus with other specified complication: Secondary | ICD-10-CM | POA: Diagnosis not present

## 2022-02-01 DIAGNOSIS — E1169 Type 2 diabetes mellitus with other specified complication: Secondary | ICD-10-CM | POA: Diagnosis not present

## 2022-02-01 DIAGNOSIS — E1140 Type 2 diabetes mellitus with diabetic neuropathy, unspecified: Secondary | ICD-10-CM | POA: Diagnosis not present

## 2022-02-02 DIAGNOSIS — E1169 Type 2 diabetes mellitus with other specified complication: Secondary | ICD-10-CM | POA: Diagnosis not present

## 2022-02-02 DIAGNOSIS — E1140 Type 2 diabetes mellitus with diabetic neuropathy, unspecified: Secondary | ICD-10-CM | POA: Diagnosis not present

## 2022-02-03 DIAGNOSIS — E1140 Type 2 diabetes mellitus with diabetic neuropathy, unspecified: Secondary | ICD-10-CM | POA: Diagnosis not present

## 2022-02-03 DIAGNOSIS — E1169 Type 2 diabetes mellitus with other specified complication: Secondary | ICD-10-CM | POA: Diagnosis not present

## 2022-02-04 ENCOUNTER — Telehealth: Payer: Self-pay | Admitting: Student

## 2022-02-04 ENCOUNTER — Telehealth: Payer: Self-pay

## 2022-02-04 DIAGNOSIS — R2681 Unsteadiness on feet: Secondary | ICD-10-CM

## 2022-02-04 DIAGNOSIS — E1169 Type 2 diabetes mellitus with other specified complication: Secondary | ICD-10-CM | POA: Diagnosis not present

## 2022-02-04 DIAGNOSIS — L98421 Non-pressure chronic ulcer of back limited to breakdown of skin: Secondary | ICD-10-CM

## 2022-02-04 DIAGNOSIS — E1140 Type 2 diabetes mellitus with diabetic neuropathy, unspecified: Secondary | ICD-10-CM | POA: Diagnosis not present

## 2022-02-04 NOTE — Telephone Encounter (Signed)
Clinic portion completed and placed in PCP box. Christen Bame, CMA

## 2022-02-04 NOTE — Telephone Encounter (Signed)
Patient's daughter dropped off FMLA paperwork to be completed. Last DOS was 11/02/21. Placed in [employer].

## 2022-02-04 NOTE — Telephone Encounter (Signed)
   RN Case Manager Care Management   Phone Outreach    02/04/2022 Name: Hayley Fisher MRN: [id number] DOB: February 18, 1931  Hayley Fisher is a 88 y.o. year old female who is a primary care patient of [name], MD .    The RNCM received a call from [place], the patient's daughter. She requested a new lightweight wheelchair for her mother as the current one is broken. I will forward this request to the patient's physician for an order. [name] mentioned that her mother's health is stable, and she is recovering from UTI. The patient's diet and blood sugar levels are good, and she has been approved for the Caps program.  Follow Up Plan: CM RN will follow up with the patient at he next scheduled Interval.   Review of patient status, including review of consultants reports, relevant laboratory and other test results, and collaboration with appropriate care team members and the patient's provider was performed as part of comprehensive patient evaluation and provision of care management services.    Lazaro Arms RN, BSN, [facility] Care Management Coordinator [place] Phone: [phone] Fax: [phone]

## 2022-02-05 DIAGNOSIS — E1169 Type 2 diabetes mellitus with other specified complication: Secondary | ICD-10-CM | POA: Diagnosis not present

## 2022-02-05 DIAGNOSIS — E1140 Type 2 diabetes mellitus with diabetic neuropathy, unspecified: Secondary | ICD-10-CM | POA: Diagnosis not present

## 2022-02-07 ENCOUNTER — Other Ambulatory Visit: Payer: Self-pay | Admitting: Student

## 2022-02-07 DIAGNOSIS — R350 Frequency of micturition: Secondary | ICD-10-CM

## 2022-02-07 DIAGNOSIS — E1140 Type 2 diabetes mellitus with diabetic neuropathy, unspecified: Secondary | ICD-10-CM | POA: Diagnosis not present

## 2022-02-07 DIAGNOSIS — E1169 Type 2 diabetes mellitus with other specified complication: Secondary | ICD-10-CM | POA: Diagnosis not present

## 2022-02-08 ENCOUNTER — Other Ambulatory Visit: Payer: Self-pay

## 2022-02-08 DIAGNOSIS — E1140 Type 2 diabetes mellitus with diabetic neuropathy, unspecified: Secondary | ICD-10-CM | POA: Diagnosis not present

## 2022-02-08 DIAGNOSIS — E1169 Type 2 diabetes mellitus with other specified complication: Secondary | ICD-10-CM | POA: Diagnosis not present

## 2022-02-08 DIAGNOSIS — R531 Weakness: Secondary | ICD-10-CM | POA: Diagnosis not present

## 2022-02-08 MED ORDER — ACCU-CHEK AVIVA PLUS VI STRP: 1.0000 | ORAL_STRIP | Status: DC

## 2022-02-09 DIAGNOSIS — E1169 Type 2 diabetes mellitus with other specified complication: Secondary | ICD-10-CM | POA: Diagnosis not present

## 2022-02-09 DIAGNOSIS — E1140 Type 2 diabetes mellitus with diabetic neuropathy, unspecified: Secondary | ICD-10-CM | POA: Diagnosis not present

## 2022-02-09 MED ORDER — EGG CRATE BED PAD MISC: 1.0000 | Freq: Once | 0 refills | Status: AC

## 2022-02-09 NOTE — Addendum Note (Signed)
Addended by: [name] on: 02/09/2022 02:30 PM   Modules accepted: Orders

## 2022-02-10 ENCOUNTER — Telehealth: Payer: Self-pay | Admitting: Student

## 2022-02-10 DIAGNOSIS — E1169 Type 2 diabetes mellitus with other specified complication: Secondary | ICD-10-CM | POA: Diagnosis not present

## 2022-02-10 DIAGNOSIS — E1140 Type 2 diabetes mellitus with diabetic neuropathy, unspecified: Secondary | ICD-10-CM | POA: Diagnosis not present

## 2022-02-10 NOTE — Telephone Encounter (Signed)
Patient dropped off form at front desk for [employer].  Verified that patient section of form has been completed.  Last DOS/WCC with PCP was [id number].  Placed form in blue team folder to be completed by clinical staff.  Creig Hines

## 2022-02-10 NOTE — Telephone Encounter (Signed)
Patient's daughter called and informed that forms are ready for pick up. Copy made and placed in batch scanning. Original placed at front desk for pick up.   Emilo Gras C Saleema Weppler, RN  

## 2022-02-11 ENCOUNTER — Encounter: Payer: Self-pay | Admitting: Internal Medicine

## 2022-02-11 ENCOUNTER — Ambulatory Visit (INDEPENDENT_AMBULATORY_CARE_PROVIDER_SITE_OTHER): Payer: Medicare Other | Admitting: Internal Medicine

## 2022-02-11 VITALS — BP 136/82 | HR 95 | Ht 63.0 in | Wt 136.0 lb

## 2022-02-11 DIAGNOSIS — E1169 Type 2 diabetes mellitus with other specified complication: Secondary | ICD-10-CM | POA: Diagnosis not present

## 2022-02-11 DIAGNOSIS — Z794 Long term (current) use of insulin: Secondary | ICD-10-CM

## 2022-02-11 DIAGNOSIS — E1140 Type 2 diabetes mellitus with diabetic neuropathy, unspecified: Secondary | ICD-10-CM

## 2022-02-11 DIAGNOSIS — E1165 Type 2 diabetes mellitus with hyperglycemia: Secondary | ICD-10-CM | POA: Diagnosis not present

## 2022-02-11 LAB — POCT GLYCOSYLATED HEMOGLOBIN (HGB A1C): Hemoglobin A1C: 8.8 % — AB (ref 4.0–5.6)

## 2022-02-11 LAB — POCT GLUCOSE (DEVICE FOR HOME USE): POC Glucose: 336 mg/dl — AB (ref 70–99)

## 2022-02-11 MED ORDER — METFORMIN HCL ER 500 MG PO TB24: 500.0000 mg | ORAL_TABLET | Freq: Every day | ORAL | 3 refills | Status: DC

## 2022-02-11 MED ORDER — METFORMIN HCL ER 500 MG PO TB24: 1000.0000 mg | ORAL_TABLET | Freq: Every day | ORAL | 3 refills | Status: DC

## 2022-02-11 MED ORDER — RYBELSUS 7 MG PO TABS: 7.0000 mg | ORAL_TABLET | Freq: Every day | ORAL | 3 refills | Status: DC

## 2022-02-11 MED ORDER — LANTUS SOLOSTAR 100 UNIT/ML ~~LOC~~ SOPN: 10.0000 [IU] | PEN_INJECTOR | SUBCUTANEOUS | 3 refills | Status: DC

## 2022-02-12 DIAGNOSIS — E1140 Type 2 diabetes mellitus with diabetic neuropathy, unspecified: Secondary | ICD-10-CM | POA: Diagnosis not present

## 2022-02-12 DIAGNOSIS — E1169 Type 2 diabetes mellitus with other specified complication: Secondary | ICD-10-CM | POA: Diagnosis not present

## 2022-02-14 DIAGNOSIS — E1169 Type 2 diabetes mellitus with other specified complication: Secondary | ICD-10-CM | POA: Diagnosis not present

## 2022-02-14 DIAGNOSIS — E1140 Type 2 diabetes mellitus with diabetic neuropathy, unspecified: Secondary | ICD-10-CM | POA: Diagnosis not present

## 2022-02-15 DIAGNOSIS — R3914 Feeling of incomplete bladder emptying: Secondary | ICD-10-CM | POA: Diagnosis not present

## 2022-02-15 DIAGNOSIS — M6281 Muscle weakness (generalized): Secondary | ICD-10-CM | POA: Diagnosis not present

## 2022-02-15 DIAGNOSIS — E1169 Type 2 diabetes mellitus with other specified complication: Secondary | ICD-10-CM | POA: Diagnosis not present

## 2022-02-15 DIAGNOSIS — E1140 Type 2 diabetes mellitus with diabetic neuropathy, unspecified: Secondary | ICD-10-CM | POA: Diagnosis not present

## 2022-02-15 DIAGNOSIS — M48061 Spinal stenosis, lumbar region without neurogenic claudication: Secondary | ICD-10-CM | POA: Diagnosis not present

## 2022-02-16 DIAGNOSIS — E1140 Type 2 diabetes mellitus with diabetic neuropathy, unspecified: Secondary | ICD-10-CM | POA: Diagnosis not present

## 2022-02-16 DIAGNOSIS — E1169 Type 2 diabetes mellitus with other specified complication: Secondary | ICD-10-CM | POA: Diagnosis not present

## 2022-02-17 DIAGNOSIS — E1169 Type 2 diabetes mellitus with other specified complication: Secondary | ICD-10-CM | POA: Diagnosis not present

## 2022-02-17 DIAGNOSIS — E1140 Type 2 diabetes mellitus with diabetic neuropathy, unspecified: Secondary | ICD-10-CM | POA: Diagnosis not present

## 2022-02-17 NOTE — Progress Notes (Unsigned)
    SUBJECTIVE:   CHIEF COMPLAINT / HPI:   Diarrhea: 88 year old woman and one of her daughters present today for evaluation of diarrhea which started 4 days ago.  Patient has a long history of constipation.  They have been using MiraLAX to titrate to effect of 1 small bowel movement per day however she was still not having a bowel movement today used 3-4 scoops of MiraLAX which resulted in several days of loose stool.  They held MiraLAX after the diarrhea started.  Diarrhea stopped yesterday.  She has had no bowel movements today.  She has been able to tolerate food by mouth without issue.  No fevers, chills, cough, runny nose, sore throat, chest pain, shortness of breath, N/V.  Patient does have 2 small wounds on her gluteal cleft which daughter is worried could be worsening.  Wheel chair: Patient is wheelchair-bound at baseline.  She has a history of pressure wounds.  Family would like Roho cushion to help decrease risk of pressure wounds.  PERTINENT  PMH / PSH: Breakdown of skin over sacrum, diarrhea, constipation, incontinence with urinary catheter in place, age-related physical debility, cognitive impairment  OBJECTIVE:   BP 130/80   Pulse 94   Temp 97.9 F (36.6 C)   Ht '5\' 3"'$  (1.6 m)   SpO2 99%   BMI 24.09 kg/m   Nursing note and vitals reviewed GEN: Elderly, AA W, resting comfortably in chair, NAD, WNWD HEENT: NCAT. PERRLA. Sclera without injection or icterus. MMM.   Cardiac: Regular rate and rhythm. Normal S1/S2. No murmurs, rubs, or gallops appreciated. 2+ radial pulses. Lungs: Clear bilaterally to ascultation. No increased WOB, no accessory muscle usage. No w/r/r. Abdomen: Soft, nontender, nondistended, normal active bowel sounds Neuro: AOx3 Skin: Patient has 2 small erosions of the skin at the top of the gluteal cleft slightly on the right side, both are 1 cm or less, no erythema, edema, or pustulant drainage, no lesions present over sit bones Psych: Pleasant and appropriate   ASSESSMENT/PLAN:   Diarrhea Diarrhea and due to laxative use.  Diarrhea has since stopped, patient appears well on exam.  Recommend patient use Metamucil to bulk up fiber in the diet to help with regulation of bowel movements.  Recommend holding off on MiraLAX for now but can start again starting from bottom of titration with 1 scoop a day.  Titrate up slowly once a day only for desired effect of 1 soft bowel movement daily.  Check BMP today.  Skin ulcer of sacrum, limited to breakdown of skin ([place]) Likely due to incontinence and diarrhea.  Recommend moisture barrier with Desitin or A&E ointment.  No sign of infection at this time.  DME order for Roho cushion placed.     Gladys Damme, MD [place]

## 2022-02-18 ENCOUNTER — Encounter: Payer: Self-pay | Admitting: Family Medicine

## 2022-02-18 ENCOUNTER — Ambulatory Visit (INDEPENDENT_AMBULATORY_CARE_PROVIDER_SITE_OTHER): Payer: Medicare Other | Admitting: Family Medicine

## 2022-02-18 VITALS — BP 130/80 | HR 94 | Temp 97.9°F | Ht 63.0 in

## 2022-02-18 DIAGNOSIS — R197 Diarrhea, unspecified: Secondary | ICD-10-CM

## 2022-02-18 DIAGNOSIS — L98421 Non-pressure chronic ulcer of back limited to breakdown of skin: Secondary | ICD-10-CM

## 2022-02-18 DIAGNOSIS — Z993 Dependence on wheelchair: Secondary | ICD-10-CM

## 2022-02-18 DIAGNOSIS — E1169 Type 2 diabetes mellitus with other specified complication: Secondary | ICD-10-CM | POA: Diagnosis not present

## 2022-02-18 DIAGNOSIS — E1140 Type 2 diabetes mellitus with diabetic neuropathy, unspecified: Secondary | ICD-10-CM | POA: Diagnosis not present

## 2022-02-18 NOTE — Patient Instructions (Addendum)
It was a pleasure to see you today!  Add one scoop of metamucil to 4-6 oz of fluid (your choice, water, juice, etc) for formed stools and regular bowel movements Titrate miralax to 1 soft bowel movement daily. Ok to wait and try again tomorrow.  We will get some labs today.  If they are abnormal or we need to do something about them, I will call you.  If they are normal, I will send you a message on MyChart (if it is active) or a letter in the mail.  If you don't hear from [place] in 2 weeks, please call the office  [phone]. I put in an order for the wheel chair cushion, you should receive a phone call about delivery. Please call our office if you do not hear from anyone: [phone] Use desitin ointment for a moisture barrier to help heel wound    Be Well,  Dr. Chauncey Reading

## 2022-02-18 NOTE — Telephone Encounter (Signed)
Patient's daughter called and informed that forms are ready for pick up. Copy made and placed in batch scanning. Original placed at front desk for pick up.   Talbot Grumbling, RN

## 2022-02-19 DIAGNOSIS — E1169 Type 2 diabetes mellitus with other specified complication: Secondary | ICD-10-CM | POA: Diagnosis not present

## 2022-02-19 DIAGNOSIS — E1140 Type 2 diabetes mellitus with diabetic neuropathy, unspecified: Secondary | ICD-10-CM | POA: Diagnosis not present

## 2022-02-19 LAB — BASIC METABOLIC PANEL
BUN/Creatinine Ratio: 20 (ref 12–28)
BUN: 19 mg/dL (ref 8–27)
CO2: 24 mmol/L (ref 20–29)
Calcium: 9.5 mg/dL (ref 8.7–10.3)
Chloride: 98 mmol/L (ref 96–106)
Creatinine, Ser: 0.95 mg/dL (ref 0.57–1.00)
Glucose: 336 mg/dL — ABNORMAL HIGH (ref 70–99)
Potassium: 5.5 mmol/L — ABNORMAL HIGH (ref 3.5–5.2)
Sodium: 141 mmol/L (ref 134–144)
eGFR: 58 mL/min/{1.73_m2} — ABNORMAL LOW (ref >59)

## 2022-02-20 DIAGNOSIS — R197 Diarrhea, unspecified: Secondary | ICD-10-CM | POA: Insufficient documentation

## 2022-02-20 HISTORY — DX: Diarrhea, unspecified: R19.7

## 2022-02-20 NOTE — Assessment & Plan Note (Addendum)
Diarrhea and due to laxative use.  Diarrhea has since stopped, patient appears well on exam.  Recommend patient use Metamucil to bulk up fiber in the diet to help with regulation of bowel movements.  Recommend holding off on MiraLAX for now but can start again starting from bottom of titration with 1 scoop a day.  Titrate up slowly once a day only for desired effect of 1 soft bowel movement daily.  Check BMP today.

## 2022-02-20 NOTE — Assessment & Plan Note (Signed)
Likely due to incontinence and diarrhea.  Recommend moisture barrier with Desitin or A&E ointment.  No sign of infection at this time.  DME order for Roho cushion placed.

## 2022-02-21 DIAGNOSIS — E1169 Type 2 diabetes mellitus with other specified complication: Secondary | ICD-10-CM | POA: Diagnosis not present

## 2022-02-21 DIAGNOSIS — E1140 Type 2 diabetes mellitus with diabetic neuropathy, unspecified: Secondary | ICD-10-CM | POA: Diagnosis not present

## 2022-02-23 DIAGNOSIS — E1169 Type 2 diabetes mellitus with other specified complication: Secondary | ICD-10-CM | POA: Diagnosis not present

## 2022-02-23 DIAGNOSIS — E1140 Type 2 diabetes mellitus with diabetic neuropathy, unspecified: Secondary | ICD-10-CM | POA: Diagnosis not present

## 2022-02-24 ENCOUNTER — Other Ambulatory Visit: Payer: Self-pay

## 2022-02-24 DIAGNOSIS — E1140 Type 2 diabetes mellitus with diabetic neuropathy, unspecified: Secondary | ICD-10-CM | POA: Diagnosis not present

## 2022-02-24 DIAGNOSIS — E1169 Type 2 diabetes mellitus with other specified complication: Secondary | ICD-10-CM | POA: Diagnosis not present

## 2022-02-24 MED ORDER — "INSULIN SYRINGE-NEEDLE U-100 31G X 5/16"" 0.3 ML MISC": 2 refills | Status: DC

## 2022-02-25 ENCOUNTER — Ambulatory Visit: Payer: Medicare Other | Admitting: Family Medicine

## 2022-02-25 DIAGNOSIS — E1169 Type 2 diabetes mellitus with other specified complication: Secondary | ICD-10-CM | POA: Diagnosis not present

## 2022-02-25 DIAGNOSIS — E1140 Type 2 diabetes mellitus with diabetic neuropathy, unspecified: Secondary | ICD-10-CM | POA: Diagnosis not present

## 2022-02-26 DIAGNOSIS — E1169 Type 2 diabetes mellitus with other specified complication: Secondary | ICD-10-CM | POA: Diagnosis not present

## 2022-02-26 DIAGNOSIS — E1140 Type 2 diabetes mellitus with diabetic neuropathy, unspecified: Secondary | ICD-10-CM | POA: Diagnosis not present

## 2022-02-28 DIAGNOSIS — E1169 Type 2 diabetes mellitus with other specified complication: Secondary | ICD-10-CM | POA: Diagnosis not present

## 2022-02-28 DIAGNOSIS — E1140 Type 2 diabetes mellitus with diabetic neuropathy, unspecified: Secondary | ICD-10-CM | POA: Diagnosis not present

## 2022-03-01 ENCOUNTER — Telehealth: Payer: Self-pay

## 2022-03-01 ENCOUNTER — Ambulatory Visit (INDEPENDENT_AMBULATORY_CARE_PROVIDER_SITE_OTHER): Payer: Medicare Other | Admitting: Student

## 2022-03-01 VITALS — BP 142/74 | HR 75

## 2022-03-01 DIAGNOSIS — Z79899 Other long term (current) drug therapy: Secondary | ICD-10-CM | POA: Diagnosis not present

## 2022-03-01 DIAGNOSIS — E875 Hyperkalemia: Secondary | ICD-10-CM

## 2022-03-01 DIAGNOSIS — R54 Age-related physical debility: Secondary | ICD-10-CM | POA: Diagnosis not present

## 2022-03-01 DIAGNOSIS — E1169 Type 2 diabetes mellitus with other specified complication: Secondary | ICD-10-CM | POA: Diagnosis not present

## 2022-03-01 DIAGNOSIS — E1140 Type 2 diabetes mellitus with diabetic neuropathy, unspecified: Secondary | ICD-10-CM | POA: Diagnosis not present

## 2022-03-01 NOTE — Telephone Encounter (Signed)
Community message sent to [employer] for DME supplies.   Will await response.   Talbot Grumbling, RN

## 2022-03-01 NOTE — Telephone Encounter (Signed)
Receipt confirmed by [employer].   Noa Constante C Nicklos Gaxiola, RN  

## 2022-03-01 NOTE — Progress Notes (Signed)
    SUBJECTIVE:   CHIEF COMPLAINT / HPI: Recheck Potassium  Patient had elevated potassium to 5.5 at last visit.  She has been holding lisinopril since Wednesday due to this. Denies any chest pain, palpatations, headache, vision changes, shortness of breath or chest pain.  Discussed with patient about geriatric clinic given polypharmacy and to help with caregiver stress among other things and family and patient very interested. Would like Roho cushion sooner and lightweight wheelchair.  PERTINENT  PMH / PSH: HTN, hx of DVT  OBJECTIVE:   BP (!) 142/74   Pulse 75   SpO2 96%   General:  NAD, awake, alert, responsive to questions Head: Normocephalic atraumatic CV: Regular rate and rhythm no murmurs rubs or gallops Respiratory: Clear to ausculation bilaterally, no wheezes rales or crackles, chest rises symmetrically,  no increased work of breathing Abdomen: Soft, non-tender, non-distended, normoactive bowel sounds  Extremities: Wheelchair bound, right leg with stable edema  ASSESSMENT/PLAN:   Age-related physical debility -DME orders for light weight wheelchair and Roho cushion -Sent message to referral coordinator for geriatric clinic referral   Hyperkalemia Will recheck today and hold lisinopril. BP in control today - Basic Metabolic Panel   [name], MD [place]

## 2022-03-01 NOTE — Patient Instructions (Signed)
It was great to see you! Thank you for allowing me to participate in your care!   I recommend that you always bring your medications to each appointment as this makes it easy to ensure we are on the correct medications and helps [place] not miss when refills are needed.  Our plans for today:  - We will check the BMP today - I have sent a message for a referral to our geriatric clinic and they will sen you a packet in the mail to complete - the cushion order has been routed to the RN team  We are checking some labs today, I will call you if they are abnormal will send you a MyChart message or a letter if they are normal.  If you do not hear about your labs in the next 2 weeks please let us know.  Take care and seek immediate care sooner if you develop any concerns. Please remember to show up 15 minutes before your scheduled appointment time!  [name], MD [place]

## 2022-03-02 DIAGNOSIS — E1140 Type 2 diabetes mellitus with diabetic neuropathy, unspecified: Secondary | ICD-10-CM | POA: Diagnosis not present

## 2022-03-02 DIAGNOSIS — E1169 Type 2 diabetes mellitus with other specified complication: Secondary | ICD-10-CM | POA: Diagnosis not present

## 2022-03-02 DIAGNOSIS — Z79899 Other long term (current) drug therapy: Secondary | ICD-10-CM | POA: Insufficient documentation

## 2022-03-02 LAB — BASIC METABOLIC PANEL
BUN/Creatinine Ratio: 22 (ref 12–28)
BUN: 18 mg/dL (ref 8–27)
CO2: 25 mmol/L (ref 20–29)
Calcium: 9.3 mg/dL (ref 8.7–10.3)
Chloride: 101 mmol/L (ref 96–106)
Creatinine, Ser: 0.82 mg/dL (ref 0.57–1.00)
Glucose: 211 mg/dL — ABNORMAL HIGH (ref 70–99)
Potassium: 5.1 mmol/L (ref 3.5–5.2)
Sodium: 140 mmol/L (ref 134–144)
eGFR: 69 mL/min/{1.73_m2} (ref >59)

## 2022-03-02 NOTE — Assessment & Plan Note (Signed)
-  DME orders for light weight wheelchair and Roho cushion -Sent message to referral coordinator for geriatric clinic referral

## 2022-03-03 DIAGNOSIS — E1140 Type 2 diabetes mellitus with diabetic neuropathy, unspecified: Secondary | ICD-10-CM | POA: Diagnosis not present

## 2022-03-03 DIAGNOSIS — E1169 Type 2 diabetes mellitus with other specified complication: Secondary | ICD-10-CM | POA: Diagnosis not present

## 2022-03-04 DIAGNOSIS — E1169 Type 2 diabetes mellitus with other specified complication: Secondary | ICD-10-CM | POA: Diagnosis not present

## 2022-03-04 DIAGNOSIS — E1140 Type 2 diabetes mellitus with diabetic neuropathy, unspecified: Secondary | ICD-10-CM | POA: Diagnosis not present

## 2022-03-05 DIAGNOSIS — E1140 Type 2 diabetes mellitus with diabetic neuropathy, unspecified: Secondary | ICD-10-CM | POA: Diagnosis not present

## 2022-03-05 DIAGNOSIS — E1169 Type 2 diabetes mellitus with other specified complication: Secondary | ICD-10-CM | POA: Diagnosis not present

## 2022-03-10 DIAGNOSIS — R531 Weakness: Secondary | ICD-10-CM | POA: Diagnosis not present

## 2022-03-15 ENCOUNTER — Encounter: Payer: Self-pay | Admitting: *Deleted

## 2022-03-15 DIAGNOSIS — R3914 Feeling of incomplete bladder emptying: Secondary | ICD-10-CM | POA: Diagnosis not present

## 2022-03-23 ENCOUNTER — Telehealth: Payer: Self-pay | Admitting: Internal Medicine

## 2022-03-23 ENCOUNTER — Telehealth: Payer: Self-pay

## 2022-03-23 ENCOUNTER — Other Ambulatory Visit: Payer: Self-pay

## 2022-03-23 DIAGNOSIS — E039 Hypothyroidism, unspecified: Secondary | ICD-10-CM

## 2022-03-23 MED ORDER — LEVOTHYROXINE SODIUM 50 MCG PO TABS: 50.0000 ug | ORAL_TABLET | Freq: Every day | ORAL | 1 refills | Status: DC

## 2022-03-23 NOTE — Telephone Encounter (Signed)
   RN Case Manager Care Management   Phone Outreach    03/23/2022 Name: Sharayah Renfrow MRN: [id number] DOB: 1931-01-04  Mara Favero is a 88 y.o. year old female who is a primary care patient of [name], MD .    I received a message from [facility] on behalf of Jethro Bastos, who is Mrs. Shadowens's daughter. She requested that I give her a call back. After reaching out, I provided her with information regarding the lightweight wheelchair. I let her know that the order had already been sent to [employer] and that we had received confirmation of the receipt. I advised her to follow up with our office or contact [employer] directly.  Follow Up Plan: CM RN will follow up with the patient at he next scheduled Interval.   Review of patient status, including review of consultants reports, relevant laboratory and other test results, and collaboration with appropriate care team members and the patient's provider was performed as part of comprehensive patient evaluation and provision of care management services.    Lazaro Arms RN, BSN, [facility] Care Management Coordinator [place] Phone: [phone] Fax: [phone]

## 2022-03-23 NOTE — Telephone Encounter (Signed)
MEDICATION: Novothoroxin  PHARMACY:  [facility]  HAS THE PATIENT CONTACTED THEIR PHARMACY?  Yes  IS THIS A 90 DAY SUPPLY :   IS PATIENT OUT OF MEDICATION: Yes  IF NOT; HOW MUCH IS LEFT:   LAST APPOINTMENT DATE: '@7'$ /01/2022  NEXT APPOINTMENT DATE:'@11'$ /08/2021  DO WE HAVE YOUR PERMISSION TO LEAVE A DETAILED MESSAGE?:Yes  OTHER COMMENTS:    **Let patient know to contact pharmacy at the end of the day to make sure medication is ready. **  ** Please notify patient to allow 48-72 hours to process**  **Encourage patient to contact the pharmacy for refills or they can request refills through [facility]**

## 2022-03-23 NOTE — Telephone Encounter (Signed)
Rx sent to pharmacy   

## 2022-03-29 ENCOUNTER — Other Ambulatory Visit: Payer: Self-pay | Admitting: Student

## 2022-04-05 ENCOUNTER — Encounter: Payer: Self-pay | Admitting: Student

## 2022-04-05 ENCOUNTER — Ambulatory Visit (INDEPENDENT_AMBULATORY_CARE_PROVIDER_SITE_OTHER): Payer: Medicare Other | Admitting: Student

## 2022-04-05 VITALS — BP 142/70 | HR 92 | Wt 137.0 lb

## 2022-04-05 DIAGNOSIS — E875 Hyperkalemia: Secondary | ICD-10-CM

## 2022-04-05 DIAGNOSIS — I152 Hypertension secondary to endocrine disorders: Secondary | ICD-10-CM | POA: Diagnosis not present

## 2022-04-05 DIAGNOSIS — N390 Urinary tract infection, site not specified: Secondary | ICD-10-CM | POA: Diagnosis not present

## 2022-04-05 DIAGNOSIS — R2681 Unsteadiness on feet: Secondary | ICD-10-CM

## 2022-04-05 DIAGNOSIS — E1159 Type 2 diabetes mellitus with other circulatory complications: Secondary | ICD-10-CM | POA: Diagnosis not present

## 2022-04-05 DIAGNOSIS — Z7189 Other specified counseling: Secondary | ICD-10-CM

## 2022-04-05 NOTE — Assessment & Plan Note (Signed)
Follow with urology for foley catheter discussion

## 2022-04-05 NOTE — Patient Instructions (Addendum)
It was great to see you! Thank you for allowing me to participate in your care!   Our plans for today:  - stop taking enalapril  - we will check BMP today - Make sure to go to [facility] appointment next month  Take care and seek immediate care sooner if you develop any concerns.  [name], MD

## 2022-04-05 NOTE — Assessment & Plan Note (Addendum)
142/70 today -BMP -stop enalapril (lisinopril was a typo on last note-patient never had lisinopril and has been holding enalapril since then)

## 2022-04-05 NOTE — Assessment & Plan Note (Signed)
-  HCPOA papers scan into system -would like 1 round of resuscitation efforts-needs form to document wishes -has CAPDA -following with geri clinic next month

## 2022-04-05 NOTE — Progress Notes (Signed)
SUBJECTIVE:   CHIEF COMPLAINT / HPI: [place]  Health goals: patient would like to come off more meds, get hearing aids, get off of foley catheter, and walk  Hypertension Patient was told to stop ACE at last visit due to hyperkalemia (enalapril). She has not had any issues with any headache, vision changes, shortness of breath or chest pain. Does not take BP at home  Gait instability Patient says that her main goal is to walk again. She said that 1 year ago she started having issues with walking. Daughter says that there was not a precipitating event and that she was previously in rehab facilities to help her walk but that she still declined. She says that she has had stiffening of her fingers as well that have worsened with time and that her relative has arthritis. At this time she needs 2 people to help her get up from her wheelchair and she can't get up on her own.   Chronic foley catheter Patient's other goal is to come off of the foley catheter. She says that it is not painful but there is some discomfort. Daughter says she has had much less UTIs since having the foley in place and that she had to change many diapers during the day when she did not have it.   Social  GOC Patient lives at home with daughter Herbert Pun husband, and her sister who is 65 years old. She has an aide that comes to her house in the mornings until the afternoon time and LaLeisa takes care of her after that. [name] says that her Chauncey Reading is her son Margeart Allender [phone]). Daughter says they will bring the paperwork to scan into Epic. Daughters say that she just got CAPDA and they help her for 6 hours a day and are pleased with this Monday, Saturday and Sunday. Ms Viscomi says she would like "1 round of resuscitation efforts performed" and then to call it if she were to be hospitalized. She does not have any forms with this that she knows of. They are excited to follow up in geriatric clinic as well.  Ms. 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 She said she quit when she was 49 because her son refused to let her in his house while she was smoking still. She quit cold [place] and has never looked back since then.  PERTINENT  PMH / PSH: chronic dvt w IVC filter  OBJECTIVE:   BP (!) 142/70   Pulse 92   Wt 137 lb (62.1 kg)   SpO2 98%   BMI 24.27 kg/m   General: NAD, awake, alert, responsive to questions Head: Normocephalic atraumatic CV: Regular rate and rhythm no murmurs rubs or gallops Respiratory: chest rises symmetrically, no increased work of breathing Extremities: bilateral lower extremity edema (R>L), stiffened hands with some muscle wasting  ASSESSMENT/PLAN:   Goals of care, counseling/discussion -HCPOA papers scan into system -would like 1 round of resuscitation efforts-needs form to document wishes -has CAPDA -following with geri clinic next month  Unstable gait 1 year ago has not been able to walk on own. Given findings of some hypothenar wasting and urinary issues could consider a myelopathy that has caused this change is status last year. No recent lumbar MRI. Working with PT  Hypertension associated with diabetes ([place]) 142/70 today -BMP -stop enalapril (lisinopril was a typo on last note-patient  never had lisinopril and has been holding enalapril since then)  Frequent UTI Follow with urology for foley catheter discussion   [name], MD [place]

## 2022-04-05 NOTE — Assessment & Plan Note (Signed)
1 year ago has not been able to walk on own. Given findings of some hypothenar wasting and urinary issues could consider a myelopathy that has caused this change is status last year. No recent lumbar MRI. Working with PT

## 2022-04-06 ENCOUNTER — Other Ambulatory Visit: Payer: Self-pay | Admitting: Student

## 2022-04-06 ENCOUNTER — Encounter: Payer: Self-pay | Admitting: Student

## 2022-04-06 DIAGNOSIS — K219 Gastro-esophageal reflux disease without esophagitis: Secondary | ICD-10-CM

## 2022-04-06 DIAGNOSIS — F419 Anxiety disorder, unspecified: Secondary | ICD-10-CM

## 2022-04-06 DIAGNOSIS — R350 Frequency of micturition: Secondary | ICD-10-CM

## 2022-04-06 LAB — BASIC METABOLIC PANEL
BUN/Creatinine Ratio: 20 (ref 12–28)
BUN: 15 mg/dL (ref 8–27)
CO2: 27 mmol/L (ref 20–29)
Calcium: 9.3 mg/dL (ref 8.7–10.3)
Chloride: 97 mmol/L (ref 96–106)
Creatinine, Ser: 0.75 mg/dL (ref 0.57–1.00)
Glucose: 213 mg/dL — ABNORMAL HIGH (ref 70–99)
Potassium: 4.9 mmol/L (ref 3.5–5.2)
Sodium: 138 mmol/L (ref 134–144)
eGFR: 77 mL/min/{1.73_m2} (ref >59)

## 2022-04-10 DIAGNOSIS — R531 Weakness: Secondary | ICD-10-CM | POA: Diagnosis not present

## 2022-04-19 ENCOUNTER — Ambulatory Visit: Payer: Medicare Other | Admitting: Podiatry

## 2022-04-19 DIAGNOSIS — R3914 Feeling of incomplete bladder emptying: Secondary | ICD-10-CM | POA: Diagnosis not present

## 2022-04-19 DIAGNOSIS — N390 Urinary tract infection, site not specified: Secondary | ICD-10-CM | POA: Diagnosis not present

## 2022-04-20 ENCOUNTER — Ambulatory Visit: Payer: Self-pay

## 2022-04-20 NOTE — Patient Outreach (Signed)
  Care Coordination   04/20/2022 Name: Hayley Fisher MRN: [id number] DOB: 09/02/1930   Care Coordination Outreach Attempts:  An unsuccessful telephone outreach was attempted today to offer the patient information about available care coordination services as a benefit of their health plan.   Follow Up Plan:  Additional outreach attempts will be made to offer the patient care coordination information and services.   Encounter Outcome:  No Answer  Care Coordination Interventions Activated:  No   Care Coordination Interventions:  No, not indicated    Lazaro Arms RN, BSN, [facility]   Phone: [phone]

## 2022-04-20 NOTE — Patient Instructions (Signed)
Visit Information  Thank you for taking time to visit with me today. Please don't hesitate to contact me if I can be of assistance to you.   Following are the goals we discussed today:   Goals Addressed               This Visit's Progress     I have a UTI (pt-stated)        Care Coordination Interventions: Advised patient, providing education and rationale, to check cbg and record, calling office for findings outside established parameters  Discussed plans with patient for ongoing care management follow up and provided patient with direct contact information for care management team Active listening / Reflection utilized  Emotional Support Provided I had a conversation with [name] who informed me that Mrs. Granderson's blood sugar levels are stable. However, she's still experiencing problems with UTI. Recently, she replaced her catheter, and a urine sample was taken for testing. I advised her to continue taking her medications and follow up with her doctor regularly.        Our next appointment is by telephone on 11/30 at 130 pm  Please call the care guide team at [phone] if you need to cancel or reschedule your appointment.   If you are experiencing a Mental Health or [place] or need someone to talk to, please call 1-800-273-TALK (toll free, 24 hour hotline)  Patient verbalizes understanding of instructions and care plan provided today and agrees to view in [place]. Active MyChart status and patient understanding of how to access instructions and care plan via MyChart confirmed with patient.     Lazaro Arms RN, BSN, [facility]   Phone: [phone]

## 2022-04-20 NOTE — Patient Outreach (Signed)
  Care Coordination   Follow Up Visit Note   04/20/2022 Name: Hayley Fisher MRN: [id number] DOB: 02/20/1931  Hayley Fisher is a 88 y.o. year old female who sees Hayley Heck, MD for primary care. I spoke with  Hayley Fisher daughter Hayley Fisher by phone today.  What matters to the patients health and wellness today?  I have a UTI.    Goals Addressed               This Visit's Progress     I have a UTI (pt-stated)        Care Coordination Interventions: Advised patient, providing education and rationale, to check cbg and record, calling office for findings outside established parameters  Discussed plans with patient for ongoing care management follow up and provided patient with direct contact information for care management team Active listening / Reflection utilized  Emotional Support Provided I had a conversation with Hayley Fisher who informed me that Hayley Fisher's blood sugar levels are stable. However, she's still experiencing problems with UTI. Recently, she replaced her catheter, and a urine sample was taken for testing. I advised her to continue taking her medications and follow up with her doctor regularly.        SDOH assessments and interventions completed:  No     Care Coordination Interventions Activated:  Yes  Care Coordination Interventions:  Yes, provided   Follow up plan: Follow up call scheduled for 11/30 at 130 pm    Encounter Outcome:  Pt. Visit Completed    Hayley Arms RN, BSN, [facility]   Phone: [phone]

## 2022-04-25 DIAGNOSIS — R531 Weakness: Secondary | ICD-10-CM | POA: Diagnosis not present

## 2022-04-27 ENCOUNTER — Other Ambulatory Visit: Payer: Self-pay

## 2022-04-27 DIAGNOSIS — E1140 Type 2 diabetes mellitus with diabetic neuropathy, unspecified: Secondary | ICD-10-CM

## 2022-04-27 MED ORDER — ACCU-CHEK AVIVA PLUS VI STRP: 1.0000 | ORAL_STRIP | Status: DC

## 2022-04-28 ENCOUNTER — Other Ambulatory Visit: Payer: Self-pay | Admitting: Student

## 2022-04-28 DIAGNOSIS — E1140 Type 2 diabetes mellitus with diabetic neuropathy, unspecified: Secondary | ICD-10-CM

## 2022-05-04 ENCOUNTER — Other Ambulatory Visit: Payer: Self-pay

## 2022-05-04 DIAGNOSIS — E1140 Type 2 diabetes mellitus with diabetic neuropathy, unspecified: Secondary | ICD-10-CM

## 2022-05-04 DIAGNOSIS — E113312 Type 2 diabetes mellitus with moderate nonproliferative diabetic retinopathy with macular edema, left eye: Secondary | ICD-10-CM | POA: Diagnosis not present

## 2022-05-04 DIAGNOSIS — E113311 Type 2 diabetes mellitus with moderate nonproliferative diabetic retinopathy with macular edema, right eye: Secondary | ICD-10-CM | POA: Diagnosis not present

## 2022-05-04 MED ORDER — ACCU-CHEK AVIVA PLUS VI STRP: ORAL_STRIP | 3 refills | Status: DC

## 2022-05-05 ENCOUNTER — Other Ambulatory Visit: Payer: Self-pay

## 2022-05-05 ENCOUNTER — Ambulatory Visit (INDEPENDENT_AMBULATORY_CARE_PROVIDER_SITE_OTHER): Payer: Medicare Other | Admitting: Family Medicine

## 2022-05-05 VITALS — BP 152/91 | HR 93 | Ht 63.0 in | Wt 134.0 lb

## 2022-05-05 DIAGNOSIS — F5101 Primary insomnia: Secondary | ICD-10-CM

## 2022-05-05 DIAGNOSIS — L98421 Non-pressure chronic ulcer of back limited to breakdown of skin: Secondary | ICD-10-CM

## 2022-05-05 DIAGNOSIS — Z9359 Other cystostomy status: Secondary | ICD-10-CM | POA: Insufficient documentation

## 2022-05-05 DIAGNOSIS — E1140 Type 2 diabetes mellitus with diabetic neuropathy, unspecified: Secondary | ICD-10-CM

## 2022-05-05 DIAGNOSIS — N3941 Urge incontinence: Secondary | ICD-10-CM | POA: Insufficient documentation

## 2022-05-05 DIAGNOSIS — Z794 Long term (current) use of insulin: Secondary | ICD-10-CM

## 2022-05-05 DIAGNOSIS — F03A0 Unspecified dementia, mild, without behavioral disturbance, psychotic disturbance, mood disturbance, and anxiety: Secondary | ICD-10-CM

## 2022-05-05 DIAGNOSIS — H9193 Unspecified hearing loss, bilateral: Secondary | ICD-10-CM

## 2022-05-05 DIAGNOSIS — E1165 Type 2 diabetes mellitus with hyperglycemia: Secondary | ICD-10-CM | POA: Diagnosis not present

## 2022-05-05 DIAGNOSIS — R634 Abnormal weight loss: Secondary | ICD-10-CM

## 2022-05-05 DIAGNOSIS — F5104 Psychophysiologic insomnia: Secondary | ICD-10-CM

## 2022-05-05 DIAGNOSIS — Z978 Presence of other specified devices: Secondary | ICD-10-CM | POA: Insufficient documentation

## 2022-05-05 HISTORY — DX: Urge incontinence: N39.41

## 2022-05-05 LAB — POCT GLYCOSYLATED HEMOGLOBIN (HGB A1C): HbA1c, POC (controlled diabetic range): 6.5 % (ref 0.0–7.0)

## 2022-05-05 MED ORDER — DOXEPIN HCL 10 MG/ML PO CONC: 10.0000 mg | Freq: Every evening | ORAL | 12 refills | Status: DC | PRN

## 2022-05-05 MED ORDER — ACCU-CHEK AVIVA PLUS VI STRP: ORAL_STRIP | 3 refills | Status: DC

## 2022-05-05 NOTE — Progress Notes (Unsigned)
50 minutes visit    05/06/2022    8:24 AM  Montreal Cognitive Assessment   Naming (0/3) 2  Attention: Read list of digits (0/2) 2  Attention: Read list of letters (0/1) 1  Attention: Serial 7 subtraction starting at 100 (0/3) 1  Language: Repeat phrase (0/2) 2  Language : Fluency (0/1) 1  Abstraction (0/2) 0  Delayed Recall (0/5) 0  Orientation (0/6) 3   MOCA-Modified> Hand OA prevented testing Visuospatial/executive domain. Score 14 out of Total 23 possible points which equivalent to 18 out of 30 total possible points.   I have interviewed and examined the patient with Dr Nancy Fetter.    I agree with their documentation and management in their note for today.   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    Possible causes of the patient's visual hallucinations include Dementia with Lewy Body, Alzheimer's Dementia and Visual Release Hallucinations.    Other than the formed visual hallucination and a slight low frequency/low amplitude left hand tremor, I would not currently diagnose LBD.  The mild stage of her dementia would argue against Alzheimers as the cause.   Hayley Fisher does have glaucoma on drops and her visual acuity was somewhat reduced in office. The short duration of the visual hallucinations, their occurrence only at night occurring only at night, her recognition that the visions are not real, the lack of an auditory component all favor  release hallucinations over some form of late life psychosis.   A brain MRI could be considered if the hallucinations intensify, but for now I believe it is alright to hold off on imaging.  She did have an unremarkable Head CT last year.   I would recommend reassurance, ophthalmology consultation, and a trial of doxepin 10 mg po at bedtime using the 10 mg/mL solution formulation.   A trial of cholinesterase inhibitor may benefit patient if  helping with sleep using the doxepin does not.  In this case, since DLB is being considered, then rivastigmine oral or patch would be the preferred agent in its class.

## 2022-05-05 NOTE — Patient Instructions (Signed)
Thank you again for coming into the [facility] on 05/05/22.  It was our opinion that you have mild dementia that may interfere with your memory at times.   Thinking and memory in dementia often declines further over time.  Dr Davionte Lusby, the geriatrician you saw, is recommending a trial of Doxepin 10 mg/ mL liquid, taking one milliliter at bedtime to help with sleep.  Take about an hour before bed. Let us see if this will help with sleep and with hallucinations.   It is a good idea to choose someone to manage your money and property, if you were not able.  A document called a Durable Power of Attorney lets the banks and courts know who you would want managing your money and property.  Lawyers who do [employer] or [employer] can help you make a Hospital doctor.   It is also a good idea to choose someone to make decisions about your health care, if you were not able.  A document called a [place] can let your doctors and nurses know who you want making decisions about your health.    You make choose the same person for both types of Powers of Attorney or different ones.  While lawyers can also help you with making a [place], you may also ask your doctor's office to help.    Your hearing is impaired.  Your doctor made a referral to [facility] for audiology evaluation and possible hearing aid fitting.  If you find your Medicare insurance will not cover the cost of the hearing aid, please let our office know as we may be able to get coverage through a state program.

## 2022-05-05 NOTE — Progress Notes (Cosign Needed)
Provider:  Zola Button, MD with Lissa Morales, MD Location:   [facility]   Place of Service:   [facility]  PCP: [name], MD Patient Care Team: [name], MD as PCP - General (Student) Lazaro Arms, RN as Case Manager  Extended Emergency Contact Information Primary Emergency Contact: [facility] Phone: [phone] Mobile Phone: [phone] Relation: Daughter Secondary Emergency Contact: Cornelius,LaLeisa Mobile Phone: [phone] Relation: Daughter  Code Status: DNR Goals of Care: Advanced Directive information    04/05/2022   11:05 AM  Advanced Directives  Does Patient Have a Medical Advance Directive? No  Would patient like information on creating a medical advance directive? No - Patient declined     [facility]:   Patient is accompanied by child [name] (daughter) Primary caregiver:  daughter Patient's Currently living arrangement:  sister and daughter, son-in-law Patient information was obtained from historical medical records, imaging reports:  , and office notes  patient and relative(s). History/Exam limitations:  none Primary Care Provider:   [name], MD  Referring provider:  [name], MD  Reason for referral:  polypharmacy, memory impairment, caregiver stress     HPI by problems:  Chief Complaint  Patient presents with   Memory Loss    Cognitive impairment concern  Are there problems with thinking?  Cognition domains: Memory difficulties, reports previously diagnosed with dementia. Flare-ups with UTIs  When were the changes first noticed?  1 year, May 2022  Did this change occur abruptly or gradually?  gradual  Has there been any tremors or  abnormal movements?  no  Have they had in hallucinations or delusions:  yes  Have they appeared more anxious or sad lately?  no  Do they still have interests or activities they enjor doing?  yes  How has their appetite been lately?  show no change  How has their sleep been lately?  Difficulty falling asleep and staying asleep    Compared to 5 to 10 years ago, how is the patient at:   Less interested in hobbies or previously enjoyed activities?  Not assessed  Problem remembering things about family and friends e.g. names,  occupations, birthdays, addresses?  no  Problem remembering conversations or news events a few days later?  yes  Problem remembering what day and month it is? no  Problem with losing things?  Not assessed  Problem learning to use a new gadget or machine around the house, e.g., cell phones, computer, microwave, remote control?  yes  Problem with handling money for shopping?  yes  Problem handling financial matters, e.g. their pension, checking, credit cards, dealing with the bank?  yes  Problem with getting lost in familiar places?  no   Geriatric Depression Scale:  not assessed  PHQ-9: [place] Office Visit from 05/05/2022 in [place]  PHQ-9 Total Score 0        Outpatient Encounter Medications as of 05/05/2022  Medication Sig   Accu-Chek Softclix Lancets lancets 1 each by Other route See admin instructions. USE TO TEST BLOOD SUGAR UP  TO 3 TIMES A DAY.   aspirin 81 MG chewable tablet Chew 81 mg by mouth daily.   atorvastatin (LIPITOR) 40 MG tablet TAKE 1 TABLET BY MOUTH EVERY DAY (Patient taking differently: Take 40 mg by mouth daily.)   brimonidine (ALPHAGAN) 0.2 % ophthalmic solution Place 1 drop into the left eye 2 (two) times daily.   carbamide peroxide (DEBROX) 6.5 % OTIC solution Place 2 drops into both  ears 2 (two) times daily as needed (ear wax).   Catheter Self-Adhesive Urinary MISC 1 patch by  Does not apply route daily.   cetirizine (ZYRTEC) 10 MG tablet Take 10 mg by mouth daily as needed for allergies.   clotrimazole-betamethasone (LOTRISONE) cream Apply 1 application topically 2 (two) times daily as needed (rash/itching).   dorzolamide-timolol (COSOPT) 22.3-6.8 MG/ML ophthalmic solution Place 1 drop into both eyes 2 (two) times daily.   doxepin (SINEQUAN) 10 MG/ML solution Take 1 mL (10 mg total) by mouth at bedtime as needed for sleep.   DULoxetine (CYMBALTA) 60 MG capsule Take 1 capsule (60 mg total) by mouth daily.   famotidine (PEPCID) 20 MG tablet TAKE 1 TABLET BY MOUTH EVERY DAY   FEROSUL 325 (65 Fe) MG tablet TAKE 1 TABLET BY MOUTH DAILY WITH BREAKFAST   GLOBAL EASE INJECT PEN NEEDLES 32G X 4 MM MISC USE 1 SYRINGE DAILY TO INJECT VICTOZA   glucose blood (ACCU-CHEK AVIVA PLUS) test strip USE TO TEST BLOOD SUGAR UP  TO 3 TIMES A DAY.   hydrocortisone (ANUSOL-HC) 2.5 % rectal cream Place 1 application rectally 2 (two) times daily as needed for hemorrhoids or anal itching. Please use twice daily x5 days, and then twice daily as needed.   Insulin Syringe-Needle U-100 (SURE COMFORT INSULIN SYRINGE) 31G X 5/16" 0.3 ML MISC Inject 1 Syringe into the skin daily.   Insulin Syringe-Needle U-100 (TRUEPLUS INSULIN SYRINGE) 31G X 5/16" 0.3 ML MISC Use as directed   ketoconazole (NIZORAL) 2 % cream Apply 1 application topically daily as needed for irritation.   Latanoprostene Bunod (VYZULTA) 0.024 % SOLN Place 1 drop into both eyes at bedtime.   levothyroxine (SYNTHROID) 50 MCG tablet Take 1 tablet (50 mcg total) by mouth daily.   metFORMIN (GLUCOPHAGE-XR) 500 MG 24 hr tablet Take 2 tablets (1,000 mg total) by mouth daily with breakfast.   Misc. Devices (TRANSFER BOARD) MISC Use as needed for transfer from bed to chair or vice versa   MYRBETRIQ 25 MG TB24 tablet TAKE 1 TABLET BY MOUTH EVERY DAY   ondansetron (ZOFRAN ODT) 4 MG disintegrating tablet Take 1 tablet (4 mg total) by mouth every 8  (eight) hours as needed for nausea.   SARNA lotion Apply 1 application topically 4 (four) times daily as needed for itching.   Semaglutide (RYBELSUS) 7 MG TABS Take 7 mg by mouth daily.   tamsulosin (FLOMAX) 0.4 MG CAPS capsule Take 0.4 mg by mouth every morning.   insulin glargine (LANTUS SOLOSTAR) 100 UNIT/ML Solostar Pen Inject 10 Units into the skin every morning.   polyethylene glycol powder (GLYCOLAX/MIRALAX) 17 GM/SCOOP powder Take 17 g by mouth in the morning and at bedtime. (Patient taking differently: Take 17 g by mouth 2 (two) times daily as needed for mild constipation.)   senna (SENOKOT) 8.6 MG TABS tablet Take 1 tablet (8.6 mg total) by mouth 2 (two) times daily. (Patient taking differently: Take 1 tablet by mouth daily as needed for mild constipation.)   [DISCONTINUED] glucose blood (ACCU-CHEK AVIVA PLUS) test strip USE TO TEST BLOOD SUGAR UP  TO 3 TIMES A DAY.   [DISCONTINUED] Multiple Vitamins-Minerals (PRESERVISION AREDS 2+MULTI VIT) CAPS Take 1 capsule by mouth daily.   [DISCONTINUED] witch hazel-glycerin (TUCKS) pad Apply 1 application topically 2 (two) times daily as needed for itching.   No facility-administered encounter medications on file as of 05/05/2022.    History Patient Active Problem List   Diagnosis Date Noted   Diabetic  neuropathy ([place]) 05/05/2022    Priority: High   Polypharmacy 03/02/2022    Priority: High   Type 2 diabetes mellitus with hyperglycemia, with long-term current use of insulin ([place]) 02/11/2022    Priority: High   Pressure injury of skin 11/05/2021    Priority: High   DVT (deep venous thrombosis) ([place]) 11/04/2021    Priority: High   Mood disorder ([place]) 09/09/2021    Priority: High   Malnutrition of moderate degree 02/23/2021    Priority: High   Mild dementia ([place]) 02/21/2021    Priority: High   Skin ulcer of sacrum, limited to breakdown of skin ([place]) 03/16/2017    Priority: High   Type 2 diabetes mellitus with diabetic neuropathy ([place])  10/19/2006    Priority: High   Hyperlipidemia associated with type 2 diabetes mellitus ([place]) 10/19/2006    Priority: High   Hypertension associated with diabetes ([place]) 10/19/2006    Priority: High   Urge incontinence 05/05/2022    Priority: Medium    Chronic indwelling Foley catheter     Priority: Medium    Constipation 08/31/2021    Priority: Medium    Incontinence 07/14/2021    Priority: Medium    Hypothyroidism 12/09/2020    Priority: Medium    Spinal stenosis of lumbar region 01/13/2016    Priority: Medium    Glaucoma 10/19/2006    Priority: Medium    Osteoarthritis, multiple sites 10/19/2006    Priority: Medium    Insomnia 08/31/2021    Priority: Low   Normocytic anemia 06/27/2021    Priority: Low   Unstable gait 04/03/2018    Priority: Low   Frequent falls 04/03/2018    Priority: Low   Age-related physical debility 03/22/2018    Priority: Low   Chronic venous insufficiency 11/09/2015    Priority: Low   DDD (degenerative disc disease), lumbosacral 11/24/2014    Priority: Low   Frequent UTI 10/29/2012    Priority: Low   Onychomycosis 04/03/2009    Priority: Low   Osteopenia 10/19/2006    Priority: Low   Hematuria 01/29/2018   Pruritus 03/17/2011   Past Medical History:  Diagnosis Date   Arthritis    Cataract    bil cateracts removed   Chronic kidney disease    "spot on one of my kidneys" per pt   Diabetes mellitus    Diarrhea 02/20/2022   Gastroesophageal reflux disease without esophagitis 09/16/2020   Glaucoma    Goals of care, counseling/discussion 09/09/2021   Hyperkalemia 07/02/2020   Hyperlipidemia    Hyperplastic colon polyp    Hypertension    Hypothyroidism 12/09/2020   Leukocytopenia 83/41/9622   Metabolic acidosis 29/79/8921   S/P IVC filter 01/16/2021   DVT 12/2020   Syncope    Thyroid disease    Urge incontinence 05/05/2022   Past Surgical History:  Procedure Laterality Date   ABDOMINAL HYSTERECTOMY     BLADDER SUSPENSION      bladder tacking     COLONOSCOPY     EYE SURGERY     INGUINAL HERNIA REPAIR Right    IR IVC FILTER PLMT / S&I /IMG GUID/MOD SED  01/16/2021   Family History  Problem Relation Age of Onset   Colon cancer Mother    Other Father        cerebral hemorrhage   Diabetes Brother    Diabetes Sister        x 2   Bone cancer Daughter    Breast cancer Daughter 39  Esophageal cancer Neg Hx    Rectal cancer Neg Hx    Stomach cancer Neg Hx    Social History   Socioeconomic History   Marital status: Single    Spouse name: Not on file   Number of children: 4   Years of education: Not on file   Highest education level: Not on file  Occupational History   Occupation: retired  Tobacco Use   Smoking status: Former    Types: Cigarettes    Quit date: 11/29/1980    Years since quitting: 41.4   Smokeless tobacco: Never  Vaping Use   Vaping Use: Never used  Substance and Sexual Activity   Alcohol use: No    Alcohol/week: 0.0 standard drinks of alcohol   Drug use: No   Sexual activity: Yes    Birth control/protection: None  Other Topics Concern   Not on file  Social History Narrative   04/05/22   Patient lives at home with daughter Herbert Pun husband, and her sister who is 93 years old.    She has an aide that comes to her house in the mornings until the afternoon time and LaLeisa takes care of her after that.        Daughters say that she just got [employer] and they help her for 6 hours a day and are pleased with this Monday, Saturday and Sunday.    Ms Auten says she would like "1 round of resuscitation efforts peLaLeisa says that her Chauncey Reading is her son Raquelle Pietro [phone]).rformed" and then to call it if she were to be hospitalized.    Social Determinants of Health   Financial Resource Strain: Not on file  Food Insecurity: Not on file  Transportation Needs: Not on file  Physical Activity: Not on file  Stress: Not on file  Social Connections: Not on file       Cardiovascular Risk Factors: Hypertension and Lipids  Educational History: 14 years formal education Personal History of Seizures: No -  Personal History of Stroke: No -  Personal History of Psychiatric Disorders: No -  Family History of Dementia: No -    Basic Activities of Daily Living  Dressing: Total assistance Eating: Partial assistance Ambulation: Total assistance Toileting: Total assistance Bathing: Total assistance  Instrumental Activities of Daily Living Shopping: Total assistance House/Yard Work: Total assistance Administration of medications: Total assistance Finances: Total assistance Telephone: Total assistance Transportation: Total assistance  Mobility Assist Devices:  wheelchair, manual  Caregivers in home: son-in-law and daughter  Caregiver Stress Self-Assessment (Zarit Score):  not assessed   Formal Home Health Assistance  Physical Therapy: yes  Occupational Therapy: yes             Home Aid / Personal Care Service: no             Homemaker services: no  FALLS in last five office visits:     05/05/2022    1:40 PM 04/05/2022   11:06 AM 05/11/2021   11:00 AM 03/04/2021   11:04 AM 09/21/2020   10:26 AM  Fall Risk   Falls in the past year? 0 0 0 1 0  Number falls in past yr: 0 0 0 1   Injury with Fall? 0 0 0 1   Risk for fall due to :  Impaired balance/gait;Impaired mobility;History of fall(s)  History of fall(s);Impaired mobility   Follow up    Falls evaluation completed;Falls prevention discussed     Health Maintenance reviewed: Immunization History  Administered  Date(s) Administered   Fluad Quad(high Dose 65+) 08/05/2019, 08/17/2020, 05/11/2021   Influenza Split 07/22/2011, 07/30/2012   Influenza Whole 06/09/2008   Influenza, High Dose Seasonal PF 10/16/2018   Influenza,inj,Quad PF,6+ Mos 07/03/2014, 09/09/2015, 08/09/2016   PFIZER(Purple Top)SARS-COV-2 Vaccination 09/23/2019, 09/30/2019, 06/09/2020   Pneumococcal Conjugate-13  07/03/2014   Pneumococcal Polysaccharide-23 08/22/1998   Td 08/22/2001   Health Maintenance Topics with due status: Overdue     Topic Date Due   Zoster Vaccines- Shingrix Never done   OPHTHALMOLOGY EXAM 10/23/2020    Diet: Regular Nutritional supplements: not assessed  Geriatric Syndromes: no assessed   Vital Signs Weight: 134 lb (60.8 kg) Body mass index is 23.74 kg/m. CrCl cannot be calculated (Patient's most recent lab result is older than the maximum 21 days allowed.). Body surface area is 1.64 meters squared. Vitals:   05/05/22 1339  BP: (!) 152/91  Pulse: 93  SpO2: 100%  Weight: 134 lb (60.8 kg)  Height: '5\' 3"'$  (1.6 m)   Wt Readings from Last 3 Encounters:  05/05/22 134 lb (60.8 kg)  04/05/22 137 lb (62.1 kg)  02/11/22 136 lb (61.7 kg)   Hearing Screening   '250Hz'$  '500Hz'$  '1000Hz'$  '2000Hz'$  '4000Hz'$   Right ear 40 40 40 40 40  Left ear 40 40 40 40 40   Vision Screening   Right eye Left eye Both eyes  Without correction '20/50 20/50 20/40 '$  With correction     Comments: Patient followed by [facility].Marland Kitchen [phone]. Patient had recent visit yesterday.   Physical Examination:  VS reviewed Physical Exam  Vitals:   05/05/22 1339  BP: (!) 152/91  Pulse: 93  SpO2: 100%    HEENT: Bilateral EAC adequately patent, I.e., able to see TMs General physical exam: well-dressed and groomed. Pleasant and cooperative. Cardiac: Regular rate and rhythm without murmur. No carotid bruits Lungs: Clear to auscultation. Neuro: alert, interactive, CN II-XII intact, normal tone  No other insights were derived from the general Exam   Parkinsonism Yes '[]'$  No '[x]'$   Rigidity Ab nl ? Yes '[]'$  No '[x]'$   Cranial Nerve Abnl ? Yes '[]'$  No '[x]'$   Vestibulo-ocular reflex (HINTS)  Abnl ? Yes '[]'$  No '[]'$   Smooth Pursuit Abnl Yes '[]'$  No '[]'$   Sensory Abnl ? Yes '[]'$  No '[]'$   Gait Abnl ? '[]'$  slow, '[]'$  petit ped, '[]'$  heel past toe no present, '[]'$  forward stoop present, '[]'$  en bloc turn present, '[]'$  symmetric stance  phase, '[]'$  leg pasts midline present '[]'$  push back postural Stability Abnl Reflexes Abnl ? Yes '[]'$  No '[]'$  Very brisk in LE  Babinski Abnl Yes '[]'$  No '[]'$   Motor Abnl ? Yes '[]'$  No '[]'$   Tremor ? Yes '[]'$  No '[]'$      Mini-Mental State Examination or Montreal Cognitive Assessment:  Patient did not require additional cues or prompts to complete tasks. Patient was cooperative and attentive to testing tasks Patient did  appear motivated to perform well  [place]: 18/23 (drawing questions omitted due to severe hand OA)       No data to display              No data to display                  No data to display           Labs No components found for: "VITAMIND"  Lab Results  Component Value Date   VITAMINB12 1,049 (H) 01/15/2021    Lab Results  Component Value  Date   FOLATE 9.7 01/15/2021    Lab Results  Component Value Date   TSH 4.27 09/30/2021    No results found for: "RPR"  No results found for: "HIV"    Chemistry      Component Value Date/Time   NA 138 04/05/2022 1456   K 4.9 04/05/2022 1456   CL 97 04/05/2022 1456   CO2 27 04/05/2022 1456   BUN 15 04/05/2022 1456   CREATININE 0.75 04/05/2022 1456   CREATININE 0.74 03/25/2016 0921      Component Value Date/Time   CALCIUM 9.3 04/05/2022 1456   ALKPHOS 37 (L) 11/05/2021 0127   AST 16 11/05/2021 0127   ALT 12 11/05/2021 0127   BILITOT 0.4 11/05/2021 0127   BILITOT 0.4 09/16/2020 1644       CrCl cannot be calculated (Patient's most recent lab result is older than the maximum 21 days allowed.).   Lab Results  Component Value Date   HGBA1C 6.5 05/05/2022     '@10RELATIVEDAYS'$ @ Hearing Screening   '250Hz'$  '500Hz'$  '1000Hz'$  '2000Hz'$  '4000Hz'$   Right ear 40 40 40 40 40  Left ear 40 40 40 40 40   Vision Screening   Right eye Left eye Both eyes  Without correction '20/50 20/50 20/40 '$  With correction     Comments: Patient followed by [facility].Marland Kitchen [phone]. Patient had recent visit yesterday.   Lab  Results  Component Value Date   WBC 3.5 11/12/2021   HGB 10.5 (L) 11/12/2021   HCT 31.7 (L) 11/12/2021   MCV 95 11/12/2021   PLT 261 11/12/2021    Results for orders placed or performed in visit on 05/05/22 (from the past 24 hour(s))  HgB A1c   Collection Time: 05/05/22  1:35 PM  Result Value Ref Range   Hemoglobin A1C     HbA1c POC (<> result, manual entry)     HbA1c, POC (prediabetic range)     HbA1c, POC (controlled diabetic range) 6.5 0.0 - 7.0 %    Imaging Head CT:  Brain: No evidence of acute infarction, hemorrhage, hydrocephalus, extra-axial collection or mass lesion/mass effect. Dystrophic calcification bilateral basal ganglia. Diffuse low-attenuation of the periventricular white matter chronic microvascular ischemic changes are unchanged. Empty sella, unchanged.   Vascular: Atherosclerotic calcification of bilateral carotid siphons and vertebral arteries. No hyperdense vessel or unexpected calcification.   Skull: Normal. Negative for fracture or focal lesion.   Sinuses/Orbits: Bilateral cataract surgery. Paranasal sinuses are clear.  Brain MRI: none    Advanced Directives Code Status:  only wants 1 round of resuscitation efforts (per chart review) Advance Directives: HCPOA Limited code      Assessment and Plan: Please see individual consultation notes from physical therapy, pharmacy and social work for today.    Problem List Items Addressed This Visit       Endocrine    Type 2 diabetes mellitus with diabetic neuropathy (HCC)    Well-controlled      Relevant Medications   aspirin 81 MG chewable tablet   Other Relevant Orders   HgB A1c (Completed)     Nervous and Auditory   Mild dementia ([place]) - Primary    Patient meets criteria for mild dementia based on cognitive assessment (MoCA) and impairments in iADLs and ADLs. Intermittent nighttime visual hallucinations that are frightening to patient occurring every 2 weeks. - start doxepin to assist with sleep and hopefully reduce hallucinations - consider acetylcholinesterase inhibitor in the future      Relevant Medications  doxepin (SINEQUAN) 10 MG/ML solution     Other   Insomnia   Relevant Medications   doxepin (SINEQUAN) 10 MG/ML solution   Other Visit Diagnoses     Bilateral hearing loss, unspecified hearing loss type       Relevant Orders   Ambulatory referral to Audiology     Refer to audiology per family request to obtain hearing aids  Mild dementia [facility]) Patient meets criteria for mild dementia based on cognitive assessment (MoCA) and impairments in iADLs and ADLs. Intermittent nighttime visual hallucinations that are frightening to patient occurring every 2 weeks. - start doxepin to assist with sleep and hopefully reduce hallucinations - consider acetylcholinesterase inhibitor in the future  Type 2 diabetes mellitus with diabetic neuropathy [facility]) Well-controlled      Primary Contact: Extended Emergency Contact Information Primary Emergency Contact: [place] Phone: [phone] Mobile Phone: [phone] Relation: Daughter Secondary Emergency Contact: [place] Mobile Phone: [phone] Relation: Daughter  Patient to Follow up with  Dr.Jagadish or [facility] in 2 month(s)  > 60 minutes face to face were spent in total with precharting, history & physical gathering, nterdisciplinary discussion, patient and caretaker counseling  and coordination of care and documentation. The Geriatric medical team meet to discuss the patient's Greater than 50% of this time was spent in counseling, explanation of diagnosis, planning of further management, and coordination of care.  The interdisciplinary team consisted of representatives from medicine, pharmacy.  Referral to [facility] for SDOH review and education about resources was not made.

## 2022-05-05 NOTE — Progress Notes (Unsigned)
Provider:  Acquanetta Belling, MD Location:      Place of Service:     PCP: Levin Erp, MD Patient Care Team: Levin Erp, MD as PCP - General (Student) Hayley Fairly, RN as Case Manager  Extended Emergency Contact Information Primary Emergency Contact: [facility] Phone: [phone] Mobile Phone: [phone] Relation: Daughter Secondary Emergency Contact: Hayley Fisher Mobile Phone: [phone] Relation: Daughter  Code Status: DNR Goals of Care: Advanced Directive information    04/05/2022   11:05 AM  Advanced Directives  Does Patient Have a Medical Advance Directive? No  Would patient like information on creating a medical advance directive? No - Patient declined     [facility]:   Patient is {alone or w companion:315710} Primary caregiver:  {relatives - adult:5061::"spouse"} Patient's Currently living arrangement:  {relatives - adult:5061::"spouse"} Patient information was obtained from {Outside review:19423}  {info source:60032}. History/Exam limitations:  {limitations:60112::"none"} Primary Care Provider:   *** Referring provider:  *** Reason for referral:  ***  ----------------------------------------------------------------------------------------------------------------------------------------------------------------------------------------------------------------------------------------------------------------   HPI by problems:  Chief Complaint  Patient presents with   Memory Loss    Cognitive impairment concern  Are there problems with thinking?  {Cognition domains:25186}  When were the changes first noticed?  {Days to years:10300}  Did this change occur abruptly or gradually?  {abrupt, gradual:20671}  How have the changes progressed since then?  {progression since onset:119226}  Has there been any tremors or abnormal movements?  {YES/NO/WILD BMWUX:32440}  Have they had in hallucinations or  delusions:  {YES/NO/WILD CARDS:18581}  Have they appeared more anxious or sad lately?  {YES/NO/WILD NUUVO:53664}  Do they still have interests or activities they enjor doing?  {YES/NO/WILD QIHKV:42595}  How has their appetite been lately?  {Improving/worsening/no change:60406}  How has their sleep been lately?  {Sleep Symptoms:25185}    Compared to 5 to 10 years ago, how is the patient at:  Problems with Judgment, e.g., problem making decisions, bad financial decisions, problems with thinking?  {YES/NO/WILD CARDS:18581}  Less interested in hobbies or previously enjoyed activities?  {YES/NO/WILD GLOVF:64332}  Problem remembering things about family and friends e.g. names,  occupations, birthdays, addresses?  {YES/NO/WILD RJJOA:41660}  Problem remembering conversations or news events a few days later?  {YES/NO/WILD YTKZS:01093}  Problem remembering what day and month it is? {YES/NO/WILD ATFTD:32202}  Problem with losing things?  {YES/NO/WILD RKYHC:62376}  Problem learning to use a new gadget or machine around the house, e.g., cell phones, computer, microwave, remote control?  {YES/NO/WILD EGBTD:17616}  Problem with handling money for shopping?  {YES/NO/WILD WVPXT:06269}  Problem handling financial matters, e.g. their pension, checking, credit cards, dealing with the bank?  {YES/NO/WILD SWNIO:27035}  Problem with getting lost in familiar places?  {YES/NO/WILD KKXFG:18299}   Geriatric Depression Scale:  *** / 15  PHQ-9: Flowsheet Row Office Visit from 05/05/2022 in [facility]  PHQ-9 Total Score 0        Outpatient Encounter Medications as of 05/05/2022  Medication Sig   Accu-Chek Softclix Lancets lancets 1 each by Other route See admin instructions. USE TO TEST BLOOD SUGAR UP  TO 3 TIMES A DAY.   atorvastatin (LIPITOR) 40 MG tablet TAKE 1 TABLET BY MOUTH EVERY DAY (Patient taking differently: Take 40 mg by mouth daily.)   brimonidine  (ALPHAGAN) 0.2 % ophthalmic solution Place 1 drop into the left eye 2 (two) times daily.   carbamide peroxide (DEBROX) 6.5 % OTIC solution Place 2 drops into both ears 2 (two) times daily as needed (ear wax).  Catheter Self-Adhesive Urinary MISC 1 patch by Does not apply route daily.   cetirizine (ZYRTEC) 10 MG tablet Take 10 mg by mouth daily as needed for allergies.   clotrimazole-betamethasone (LOTRISONE) cream Apply 1 application topically 2 (two) times daily as needed (rash/itching).   dorzolamide-timolol (COSOPT) 22.3-6.8 MG/ML ophthalmic solution Place 1 drop into both eyes 2 (two) times daily.   DULoxetine (CYMBALTA) 60 MG capsule Take 1 capsule (60 mg total) by mouth daily.   famotidine (PEPCID) 20 MG tablet TAKE 1 TABLET BY MOUTH EVERY DAY   FEROSUL 325 (65 Fe) MG tablet TAKE 1 TABLET BY MOUTH DAILY WITH BREAKFAST   GLOBAL EASE INJECT PEN NEEDLES 32G X 4 MM MISC USE 1 SYRINGE DAILY TO INJECT VICTOZA   glucose blood (ACCU-CHEK AVIVA PLUS) test strip USE TO TEST BLOOD SUGAR UP  TO 3 TIMES A DAY.   hydrocortisone (ANUSOL-HC) 2.5 % rectal cream Place 1 application rectally 2 (two) times daily as needed for hemorrhoids or anal itching. Please use twice daily x5 days, and then twice daily as needed.   insulin glargine (LANTUS SOLOSTAR) 100 UNIT/ML Solostar Pen Inject 10 Units into the skin every morning.   Insulin Syringe-Needle U-100 (SURE COMFORT INSULIN SYRINGE) 31G X 5/16" 0.3 ML MISC Inject 1 Syringe into the skin daily.   Insulin Syringe-Needle U-100 (TRUEPLUS INSULIN SYRINGE) 31G X 5/16" 0.3 ML MISC Use as directed   ketoconazole (NIZORAL) 2 % cream Apply 1 application topically daily as needed for irritation.   Latanoprostene Bunod (VYZULTA) 0.024 % SOLN Place 1 drop into both eyes at bedtime.   levothyroxine (SYNTHROID) 50 MCG tablet Take 1 tablet (50 mcg total) by mouth daily.   metFORMIN (GLUCOPHAGE-XR) 500 MG 24 hr tablet Take 2 tablets (1,000 mg total) by mouth daily with  breakfast.   Misc. Devices (TRANSFER BOARD) MISC Use as needed for transfer from bed to chair or vice versa   Multiple Vitamins-Minerals (PRESERVISION AREDS 2+MULTI VIT) CAPS Take 1 capsule by mouth daily.   MYRBETRIQ 25 MG TB24 tablet TAKE 1 TABLET BY MOUTH EVERY DAY   ondansetron (ZOFRAN ODT) 4 MG disintegrating tablet Take 1 tablet (4 mg total) by mouth every 8 (eight) hours as needed for nausea.   polyethylene glycol powder (GLYCOLAX/MIRALAX) 17 GM/SCOOP powder Take 17 g by mouth in the morning and at bedtime.   SARNA lotion Apply 1 application topically 4 (four) times daily as needed for itching.   Semaglutide (RYBELSUS) 7 MG TABS Take 7 mg by mouth daily.   senna (SENOKOT) 8.6 MG TABS tablet Take 1 tablet (8.6 mg total) by mouth 2 (two) times daily.   tamsulosin (FLOMAX) 0.4 MG CAPS capsule Take 0.4 mg by mouth every morning.   witch hazel-glycerin (TUCKS) pad Apply 1 application topically 2 (two) times daily as needed for itching.   [DISCONTINUED] glucose blood (ACCU-CHEK AVIVA PLUS) test strip USE TO TEST BLOOD SUGAR UP  TO 3 TIMES A DAY.   No facility-administered encounter medications on file as of 05/05/2022.    History Patient Active Problem List   Diagnosis Date Noted   Diabetic neuropathy (HCC) 05/05/2022    Priority: High   Polypharmacy 03/02/2022    Priority: High   Type 2 diabetes mellitus with hyperglycemia, with long-term current use of insulin (HCC) 02/11/2022    Priority: High   Pressure injury of skin 11/05/2021    Priority: High   DVT (deep venous thrombosis) (HCC) 11/04/2021    Priority: High   Mood  disorder (HCC) 09/09/2021    Priority: High   Malnutrition of moderate degree 02/23/2021    Priority: High   Cognitive impairment 02/21/2021    Priority: High   Skin ulcer of sacrum, limited to breakdown of skin (HCC) 03/16/2017    Priority: High   Type 2 diabetes mellitus with diabetic neuropathy (HCC) 10/19/2006    Priority: High   Hyperlipidemia associated  with type 2 diabetes mellitus (HCC) 10/19/2006    Priority: High   Hypertension associated with diabetes (HCC) 10/19/2006    Priority: High   Urge incontinence 05/05/2022    Priority: Medium    Chronic indwelling Foley catheter     Priority: Medium    Constipation 08/31/2021    Priority: Medium    Incontinence 07/14/2021    Priority: Medium    Hypothyroidism 12/09/2020    Priority: Medium    Spinal stenosis of lumbar region 01/13/2016    Priority: Medium    Glaucoma 10/19/2006    Priority: Medium    Osteoarthritis, multiple sites 10/19/2006    Priority: Medium    Insomnia 08/31/2021    Priority: Low   Normocytic anemia 06/27/2021    Priority: Low   Unstable gait 04/03/2018    Priority: Low   Frequent falls 04/03/2018    Priority: Low   Age-related physical debility 03/22/2018    Priority: Low   Chronic venous insufficiency 11/09/2015    Priority: Low   DDD (degenerative disc disease), lumbosacral 11/24/2014    Priority: Low   Frequent UTI 10/29/2012    Priority: Low   Onychomycosis 04/03/2009    Priority: Low   Osteopenia 10/19/2006    Priority: Low   Hematuria 01/29/2018   Pruritus 03/17/2011   Past Medical History:  Diagnosis Date   Arthritis    Cataract    bil cateracts removed   Chronic kidney disease    "spot on one of my kidneys" per pt   Diabetes mellitus    Diarrhea 02/20/2022   Gastroesophageal reflux disease without esophagitis 09/16/2020   Glaucoma    Goals of care, counseling/discussion 09/09/2021   Hyperkalemia 07/02/2020   Hyperlipidemia    Hyperplastic colon polyp    Hypertension    Hypothyroidism 12/09/2020   Leukocytopenia 06/27/2021   Metabolic acidosis 07/02/2020   S/P IVC filter 01/16/2021   DVT 12/2020   Syncope    Thyroid disease    Urge incontinence 05/05/2022   Past Surgical History:  Procedure Laterality Date   ABDOMINAL HYSTERECTOMY     BLADDER SUSPENSION     bladder tacking     COLONOSCOPY     EYE SURGERY      INGUINAL HERNIA REPAIR Right    IR IVC FILTER PLMT / S&I /IMG GUID/MOD SED  01/16/2021   Family History  Problem Relation Age of Onset   Colon cancer Mother    Other Father        cerebral hemorrhage   Diabetes Brother    Diabetes Sister        x 2   Bone cancer Daughter    Breast cancer Daughter 79   Esophageal cancer Neg Hx    Rectal cancer Neg Hx    Stomach cancer Neg Hx    Social History   Socioeconomic History   Marital status: Single    Spouse name: Not on file   Number of children: 4   Years of education: Not on file   Highest education level: Not on file  Occupational History  Occupation: retired  Tobacco Use   Smoking status: Former    Types: Cigarettes    Quit date: 11/29/1980    Years since quitting: 41.4   Smokeless tobacco: Never  Vaping Use   Vaping Use: Never used  Substance and Sexual Activity   Alcohol use: No    Alcohol/week: 0.0 standard drinks of alcohol   Drug use: No   Sexual activity: Yes    Birth control/protection: None  Other Topics Concern   Not on file  Social History Narrative   04/05/22   Patient lives at home with daughter Nile Riggs husband, and her sister who is 34 years old.    She has an aide that comes to her house in the mornings until the afternoon time and LaLeisa takes care of her after that.        Daughters say that she just got [employer] and they help her for 6 hours a day and are pleased with this Monday, Saturday and Sunday.    Ms Mickel says she would like "1 round of resuscitation efforts peLaLeisa says that her HCPOA is her son Jeff Kinkaid ([phone]).rformed" and then to call it if she were to be hospitalized.    Social Determinants of Health   Financial Resource Strain: Not on file  Food Insecurity: Not on file  Transportation Needs: Not on file  Physical Activity: Not on file  Stress: Not on file  Social Connections: Not on file      Cardiovascular Risk Factors: {CHL CARDIAC RISK FACTORS STRESS  TEST:21021014}  Educational History: *** years formal education Personal History of Seizures: {question; yes no :20885} Personal History of Stroke: {question; yes no :20885} Personal History of Head Trauma: {question; yes no :20885} Personal History of Psychiatric Disorders: {question; yes no :20885} Family History of Dementia: {question; yes no :20885}   Basic Activities of Daily Living  Dressing: {ADL:18316} Eating: {ADL:18316} Ambulation: {ADL:18316} Toileting: {ADL:18316} Bathing: {ADL:18316}  Instrumental Activities of Daily Living Shopping: {ADL:18316} House/Yard Work: {ADL:18316} Administration of medications: {ADL:18316} Finances: {ADL:18316} Telephone: {ADL:18316} Transportation: {ADL:18316}  Mobility Assist Devices: {Assist Devices:25183}  Caregivers in home: {relatives - adult:5061::"spouse"}  Caregiver Stress Self-Assessment (Zarit Score):  *** out of 80 Scoring: 0-20 = Little/No stress       21-40 = Mild/Moderate stress       41-60 = Moderate/Severe stress      61 -80 = Severe stress   Formal Home Health Assistance  Physical Therapy: {YES/NO/WILD ZOXWR:60454}  Occupational Therapy: {YES/NO/WILD UJWJX:91478}             Home Aid / Personal Care Service: {YES/NO/WILD GNFAO:13086}             Homemaker services: {YES/NO/WILD VHQIO:96295}  FALLS in last five office visits:     05/05/2022    1:40 PM 04/05/2022   11:06 AM 05/11/2021   11:00 AM 03/04/2021   11:04 AM 09/21/2020   10:26 AM  Fall Risk   Falls in the past year? 0 0 0 1 0  Number falls in past yr: 0 0 0 1   Injury with Fall? 0 0 0 1   Risk for fall due to :  Impaired balance/gait;Impaired mobility;History of fall(s)  History of fall(s);Impaired mobility   Follow up    Falls evaluation completed;Falls prevention discussed     Health Maintenance reviewed: Immunization History  Administered Date(s) Administered   Fluad Quad(high Dose 65+) 08/05/2019, 08/17/2020, 05/11/2021   Influenza Split  07/22/2011, 07/30/2012   Influenza Whole 06/09/2008  Influenza, High Dose Seasonal PF 10/16/2018   Influenza,inj,Quad PF,6+ Mos 07/03/2014, 09/09/2015, 08/09/2016   PFIZER(Purple Top)SARS-COV-2 Vaccination 09/23/2019, 09/30/2019, 06/09/2020   Pneumococcal Conjugate-13 07/03/2014   Pneumococcal Polysaccharide-23 08/22/1998   Td 08/22/2001   Health Maintenance Topics with due status: Overdue     Topic Date Due   Zoster Vaccines- Shingrix Never done   OPHTHALMOLOGY EXAM 10/23/2020    Diet: {DIET STATUS:21022986} Nutritional supplements: {NUTRITION SUPPLEMENTS:220002}  Geriatric Syndromes: Constipation {YES NO:22349}  Laxative use:{YES NO:22349}   Incontinence {YES NO:22349}  Nocturia: {YES NO:22349} Dizziness {YES NO:22349}   Syncope {YES NO:22349}  Visual Impairment {YES NO:22349}   Hearing impairment {YES NO:22349} Dentures problems: {YES NO:22349} Impaired Memory or Cognition {YES NO:22349}   Sleep problems {YES NO:22349}   Weight loss {YES NO:22349} Drug Misadventure: {YES/NO/WILD CARDS:18581}   Joint pain: {YES NO:22349} Ankle edema: {YES NO:22349} History of UTIs: {YES/NO/WILD CARDS:18581}   Vital Signs Weight: 134 lb (60.8 kg) Body mass index is 23.74 kg/m. CrCl cannot be calculated (Patient's most recent lab result is older than the maximum 21 days allowed.). Body surface area is 1.64 meters squared. Vitals:   05/05/22 1339  Weight: 134 lb (60.8 kg)   Wt Readings from Last 3 Encounters:  05/05/22 134 lb (60.8 kg)  04/05/22 137 lb (62.1 kg)  02/11/22 136 lb (61.7 kg)   No results found.  Physical Examination:  VS reviewed Physical Exam There were no vitals filed for this visit.  HEENT: Bilateral EAC adequately patent, I.e., able to see TMs General physical exam: well-dressed and groomed. Pleasant and cooperative. Cardiac: Regular rate and rhythm without murmur. No carotid bruits Lungs: Clear to auscultation.   No other insights were derived from  the general Exam   Parkinsonism Yes []  No []   Rigidity Ab nl ? Yes []  No []   Cranial Nerve Abnl ? Yes []  No []   Vestibulo-ocular reflex (HINTS)  Abnl ? Yes []  No []   Smooth Pursuit Abnl Yes []  No []   Sensory Abnl ? Yes []  No []   Gait Abnl ? []  slow, []  petit ped, []  heel past toe no present, []  forward stoop present, []  en bloc turn present, []  symmetric stance phase, []  leg pasts midline present []  push back postural Stability Abnl Reflexes Abnl ? Yes []  No []  Very brisk in LE  Babinski Abnl Yes []  No []   Motor Abnl ? Yes []  No []   Tremor ? Yes []  No []     Timed Up & Go Test: *** seconds Sit-to-Stand Test: *** / 30-seconds 4-Stage Stand Test:  Feet Side-by-side: {yes no:314532}     Feet Semi-tandem: {yes no:314532}     Feet Tandem: {yes no:314532}     One-Foot Stand:  {yes no:314532}   Mini-Mental State Examination or Montreal Cognitive Assessment:  Patient {Desc; did/not:3044021} require additional cues or prompts to complete tasks. Patient {WAS/WAS NOT:[id number]::"was not"} cooperative and attentive to testing tasks Patient {Desc; did/not:3044021} appear motivated to perform well        No data to display              No data to display                  No data to display           Labs No components found for: "VITAMIND"  Lab Results  Component Value Date   VITAMINB12 1,049 (H) 01/15/2021    Lab Results  Component Value Date   FOLATE 9.7 01/15/2021  Lab Results  Component Value Date   TSH 4.27 09/30/2021    No results found for: "RPR"  No results found for: "HIV"    Chemistry      Component Value Date/Time   NA 138 04/05/2022 1456   K 4.9 04/05/2022 1456   CL 97 04/05/2022 1456   CO2 27 04/05/2022 1456   BUN 15 04/05/2022 1456   CREATININE 0.75 04/05/2022 1456   CREATININE 0.74 03/25/2016 0921      Component Value Date/Time   CALCIUM 9.3 04/05/2022 1456   ALKPHOS 37 (L) 11/05/2021 0127   AST 16 11/05/2021 0127   ALT 12  11/05/2021 0127   BILITOT 0.4 11/05/2021 0127   BILITOT 0.4 09/16/2020 1644       CrCl cannot be calculated (Patient's most recent lab result is older than the maximum 21 days allowed.).   Lab Results  Component Value Date   HGBA1C 6.5 05/05/2022     @10RELATIVEDAYS @No  results found. Lab Results  Component Value Date   WBC 3.5 11/12/2021   HGB 10.5 (L) 11/12/2021   HCT 31.7 (L) 11/12/2021   MCV 95 11/12/2021   PLT 261 11/12/2021    Results for orders placed or performed in visit on 05/05/22 (from the past 24 hour(s))  HgB A1c   Collection Time: 05/05/22  1:35 PM  Result Value Ref Range   Hemoglobin A1C     HbA1c POC (<> result, manual entry)     HbA1c, POC (prediabetic range)     HbA1c, POC (controlled diabetic range) 6.5 0.0 - 7.0 %    Imaging Head CT: ***  Brain MRI: ***   Personal Strengths {PATIENT STRENGTHS:22666}  Support System Strengths {Patient Coping Strengths:807 243 0734}   Advanced Directives Code Status: *** Advance Directives: *** {Palliative Code status:23503}   ------------------------------------------------------------------------------------------------------------------------------------------------------------------------------------------------------------------------------------------------------------------------------------------------------------------------------------------------------------------------------------------------------------------------------------------------------------------------------------------------------------  Assessment and Plan: Please see individual consultation notes from physical therapy, pharmacy and social work for today.    Problem List Items Addressed This Visit       High   Type 2 diabetes mellitus with diabetic neuropathy (HCC) - Primary   Relevant Orders   HgB A1c (Completed)   No problem-specific Assessment & Plan notes found for this encounter.      Primary Contact: Extended  Emergency Contact Information Primary Emergency Contact: Randson,Jackie Home Phone: [phone] Mobile Phone: [phone] Relation: Daughter Secondary Emergency Contact: Hayley Fisher Mobile Phone: [phone] Relation: Daughter  Patient to Follow up with  Dr.*** or [facility] in {NUMBER 1-10:22536} {Time; day/wk/mo/yr(s):9076}  > *** minutes face to face were spent in total with precharting, history & physical gathering, nterdisciplinary discussion, patient and caretaker counseling and coordination of care and documentation. The Geriatric medical team meet to discuss the patient's Greater than 50% of this time was spent in counseling, explanation of diagnosis, planning of further management, and coordination of care.  The interdisciplinary team consisted of representatives from medicine, pharmacy.  Referral to [facility] for SDOH review and education about resources {WAS/WAS [phone]::"was not"} made.

## 2022-05-05 NOTE — Progress Notes (Signed)
Conducted comprehensive medication review as part of geriatric clinic visit.   No suggestions for change. No medication discrepancies identified. Patient and daughters do endorse patient has difficulty sleeping. However, no stimulating medications found on patient's list.  Joseph Art, Pharm.D. PGY-2 Ambulatory Care Pharmacy Resident 05/05/2022 3:51 PM

## 2022-05-06 ENCOUNTER — Encounter: Payer: Self-pay | Admitting: Family Medicine

## 2022-05-06 DIAGNOSIS — R634 Abnormal weight loss: Secondary | ICD-10-CM | POA: Insufficient documentation

## 2022-05-06 NOTE — Assessment & Plan Note (Signed)
Established problem. Adequate glycemic control.  Pt is tolerating the current medication regiment. Continue current treatment plan.              

## 2022-05-06 NOTE — Assessment & Plan Note (Addendum)
Well-controlled Lab Results  Component Value Date   HGBA1C 6.5 05/05/2022  Continue current regiment

## 2022-05-06 NOTE — Assessment & Plan Note (Signed)
Patient meets criteria for mild dementia based on cognitive assessment (MoCA) and impairments in iADLs and ADLs. Intermittent nighttime visual hallucinations that are frightening to patient occurring every 2 weeks. - start doxepin to assist with sleep and hopefully reduce hallucinations - consider acetylcholinesterase inhibitor in the future

## 2022-05-06 NOTE — Assessment & Plan Note (Signed)
Established problem Hayley Fisher has had a slow steady decline in weight over th last 5 to 10 years. Given the WL gradual nature, I suspect this is due to developing frailty phenotype. Her weight may have stabilized with daughter, [name], now living with and caring for Hayley Lizer.  Her recent start of semaglutide and its effect on her weight will bear watching.

## 2022-05-06 NOTE — Assessment & Plan Note (Signed)
Established problem that has improved and has meet goal of healed pressure ulcer.  Patient remains at risk of recurrence of pressure injury at same sacral site Daily Butt cream and frequent position changes in WC recommended.

## 2022-05-11 DIAGNOSIS — R531 Weakness: Secondary | ICD-10-CM | POA: Diagnosis not present

## 2022-05-23 DIAGNOSIS — R339 Retention of urine, unspecified: Secondary | ICD-10-CM | POA: Diagnosis not present

## 2022-05-31 ENCOUNTER — Other Ambulatory Visit: Payer: Self-pay | Admitting: Student

## 2022-05-31 DIAGNOSIS — R350 Frequency of micturition: Secondary | ICD-10-CM

## 2022-06-10 DIAGNOSIS — R531 Weakness: Secondary | ICD-10-CM | POA: Diagnosis not present

## 2022-06-13 DIAGNOSIS — N3020 Other chronic cystitis without hematuria: Secondary | ICD-10-CM | POA: Diagnosis not present

## 2022-06-13 DIAGNOSIS — R3914 Feeling of incomplete bladder emptying: Secondary | ICD-10-CM | POA: Diagnosis not present

## 2022-06-13 DIAGNOSIS — N3000 Acute cystitis without hematuria: Secondary | ICD-10-CM | POA: Diagnosis not present

## 2022-06-21 NOTE — Progress Notes (Unsigned)
Name: [name]  Age/ Sex: 88 y.o., female   MRN/ DOB: [id number], [birth year]     PCP: [name], MD   Reason for Endocrinology Evaluation: Type 2 Diabetes Mellitus  Initial Endocrine Consultative Visit: 09/30/2020    PATIENT IDENTIFIER: Hayley Fisher is a 88 y.o. female with a past medical history of T2DM, Hx DVT, DJD, Hypothyroidism. The patient has followed with Endocrinology clinic since 09/30/2020 for consultative assistance with management of her diabetes.  DIABETIC HISTORY:  [name] was diagnosed with DM 2000. Her hemoglobin A1c has ranged from 6.3% in 2022, peaking at 10.2% in 2021.   SUBJECTIVE:   During the last visit (02/11/2022): A1c 8.8%  Today (06/22/2022): [name]. She checks her blood sugars 1 times daily. The patient has not  had hypoglycemic episodes since the last clinic visit.   Has loose stools that is attributes to daily intake of  miralax  Denies nausea or vomiting   Has a nursing aid to help at home  Sees Urology for indwelling catheter, pending suprapubic catheter  Tolerating rybelsus   HOME DIABETES REGIMEN:  Lantus 8 units daily  Metformin 500 mg ,2 tablet daily  Rybelsus 7 mg daily     METER DOWNLOAD SUMMARY: unable to download 79  - 425  DIABETIC COMPLICATIONS: Microvascular complications:  Neuropathy  Denies: CKD  Last Eye Exam: Completed   Macrovascular complications:   Denies: CAD, CVA, PVD   HISTORY:  Past Medical History:  Past Medical History:  Diagnosis Date   Arthritis    Cataract    bil cateracts removed   Chronic kidney disease    "spot on one of my kidneys" per pt   Diabetes mellitus    Diarrhea 02/20/2022   Gastroesophageal reflux disease without esophagitis 09/16/2020   Glaucoma    Goals of care, counseling/discussion 09/09/2021   Hyperkalemia 07/02/2020   Hyperlipidemia    Hyperplastic colon polyp    Hypertension    Hypothyroidism 12/09/2020   Leukocytopenia 95/63/8756    Metabolic acidosis 43/32/9518   S/P IVC filter 01/16/2021   DVT 12/2020   Syncope    Thyroid disease    Urge incontinence 05/05/2022   Past Surgical History:  Past Surgical History:  Procedure Laterality Date   ABDOMINAL HYSTERECTOMY     BLADDER SUSPENSION     bladder tacking     COLONOSCOPY     EYE SURGERY     INGUINAL HERNIA REPAIR Right    IR IVC FILTER PLMT / S&I /IMG GUID/MOD SED  01/16/2021   Social History:  reports that she quit smoking about 41 years ago. Her smoking use included cigarettes. She has never used smokeless tobacco. She reports that she does not drink alcohol and does not use drugs. Family History:  Family History  Problem Relation Age of Onset   Colon cancer Mother    Other Father        cerebral hemorrhage   Diabetes Brother    Diabetes Sister        x 2   Bone cancer Daughter    Breast cancer Daughter 35   Esophageal cancer Neg Hx    Rectal cancer Neg Hx    Stomach cancer Neg Hx      HOME MEDICATIONS: Allergies as of 06/22/2022       Reactions   Tramadol Itching   Takes occasionally with benadryl   Levemir [insulin Detemir] Itching        Medication List  Accurate as of June 22, 2022 11:24 AM. If you have any questions, ask your nurse or doctor.          Accu-Chek Aviva Plus test strip Generic drug: glucose blood USE TO TEST BLOOD SUGAR UP  TO 3 TIMES A DAY.   Accu-Chek Softclix Lancets lancets 1 each by Other route See admin instructions. USE TO TEST BLOOD SUGAR UP  TO 3 TIMES A DAY.   aspirin 81 MG chewable tablet Chew 81 mg by mouth daily.   atorvastatin 40 MG tablet Commonly known as: LIPITOR TAKE 1 TABLET BY MOUTH EVERY DAY   brimonidine 0.2 % ophthalmic solution Commonly known as: ALPHAGAN Place 1 drop into the left eye 2 (two) times daily.   Catheter Self-Adhesive Urinary Misc 1 patch by Does not apply route daily.   cetirizine 10 MG tablet Commonly known as: ZYRTEC Take 10 mg by mouth daily as  needed for allergies.   clotrimazole-betamethasone cream Commonly known as: LOTRISONE Apply 1 application topically 2 (two) times daily as needed (rash/itching).   Debrox 6.5 % OTIC solution Generic drug: carbamide peroxide Place 2 drops into both ears 2 (two) times daily as needed (ear wax).   dorzolamide-timolol 2-0.5 % ophthalmic solution Commonly known as: COSOPT Place 1 drop into both eyes 2 (two) times daily.   doxepin 10 MG/ML solution Commonly known as: SINEQUAN Take 1 mL (10 mg total) by mouth at bedtime as needed for sleep.   DULoxetine 60 MG capsule Commonly known as: CYMBALTA Take 1 capsule (60 mg total) by mouth daily.   famotidine 20 MG tablet Commonly known as: PEPCID TAKE 1 TABLET BY MOUTH EVERY DAY   FeroSul 325 (65 FE) MG tablet Generic drug: ferrous sulfate TAKE 1 TABLET BY MOUTH DAILY WITH BREAKFAST   Global Ease Inject Pen Needles 32G X 4 MM Misc Generic drug: Insulin Pen Needle USE 1 SYRINGE DAILY TO INJECT VICTOZA   hydrocortisone 2.5 % rectal cream Commonly known as: ANUSOL-HC Place 1 application rectally 2 (two) times daily as needed for hemorrhoids or anal itching. Please use twice daily x5 days, and then twice daily as needed.   Insulin Syringe-Needle U-100 31G X 5/16" 0.3 ML Misc Commonly known as: Sure Comfort Insulin Syringe Inject 1 Syringe into the skin daily.   Insulin Syringe-Needle U-100 31G X 5/16" 0.3 ML Misc Commonly known as: TRUEplus Insulin Syringe Use as directed   ketoconazole 2 % cream Commonly known as: NIZORAL Apply 1 application topically daily as needed for irritation.   Lantus SoloStar 100 UNIT/ML Solostar Pen Generic drug: insulin glargine Inject 10 Units into the skin every morning.   levothyroxine 50 MCG tablet Commonly known as: SYNTHROID Take 1 tablet (50 mcg total) by mouth daily.   metFORMIN 500 MG 24 hr tablet Commonly known as: GLUCOPHAGE-XR Take 2 tablets (1,000 mg total) by mouth daily with  breakfast.   Myrbetriq 25 MG Tb24 tablet Generic drug: mirabegron ER TAKE 1 TABLET BY MOUTH EVERY DAY   ondansetron 4 MG disintegrating tablet Commonly known as: Zofran ODT Take 1 tablet (4 mg total) by mouth every 8 (eight) hours as needed for nausea.   polyethylene glycol powder 17 GM/SCOOP powder Commonly known as: GLYCOLAX/MIRALAX Take 17 g by mouth in the morning and at bedtime. What changed:  when to take this reasons to take this   Rybelsus 7 MG Tabs Generic drug: Semaglutide Take 7 mg by mouth daily.   Sarna lotion Generic drug: camphor-menthol Apply 1 application  topically 4 (four) times daily as needed for itching.   senna 8.6 MG Tabs tablet Commonly known as: SENOKOT Take 1 tablet (8.6 mg total) by mouth 2 (two) times daily. What changed:  when to take this reasons to take this   tamsulosin 0.4 MG Caps capsule Commonly known as: FLOMAX Take 0.4 mg by mouth every morning.   Transfer Board Misc Use as needed for transfer from bed to chair or vice versa   Vyzulta 0.024 % Soln Generic drug: Latanoprostene Bunod Place 1 drop into both eyes at bedtime.         OBJECTIVE:   Vital Signs: BP 126/74 (BP Location: Left Arm, Patient Position: Sitting, Cuff Size: Small)   Pulse 70   Ht '5\' 3"'$  (1.6 m)   Wt 145 lb 12.8 oz (66.1 kg)   SpO2 94%   BMI 25.83 kg/m   Wt Readings from Last 3 Encounters:  06/22/22 145 lb 12.8 oz (66.1 kg)  05/05/22 134 lb (60.8 kg)  04/05/22 137 lb (62.1 kg)     Exam: General: Pt appears well and is in NAD  Neck: General: Supple without adenopathy. Thyroid: Thyroid size normal.  No goiter or nodules appreciated.  Lungs: Clear with good BS bilat with no rales, rhonchi, or wheezes  Heart: RRR   Extremities: Trace  pretibial edema.   Neuro: MS is good with appropriate affect, pt is alert and Ox3      DATA REVIEWED:  Lab Results  Component Value Date   HGBA1C 6.5 05/05/2022   HGBA1C 8.8 (A) 02/11/2022   HGBA1C 6.7 (A)  12/16/2021   04/05/2022 GFR 77 BUN 15 Cr 0.750  05/05/2022 A1c 6.5%    09/30/2021  TSH 4.270   ASSESSMENT / PLAN / RECOMMENDATIONS:   1) Type 2 Diabetes Mellitus, Optimally  controlled, With Neuropathic  complications - Most recent A1c of 6.5 %. Goal A1c < 8.0 %.    - Her A1c has trended down without hypoglycemia but has been noted with hyperglycemia in the 300's  Discussed limiting CHO and hydration with BG's that high  - Will reduce insulin as below  - GFR normal from 03/2022  MEDICATIONS: Continue metformin 500 mg , 2 tabs daily Decrease  Lantus 6 units daily Continue  Rybelsus 7 mg daily  EDUCATION / INSTRUCTIONS: BG monitoring instructions: Patient is instructed to check her blood sugars 1 times a day, fasting. Call [facility] if: BG persistently < 70  I reviewed the Rule of 15 for the treatment of hypoglycemia in detail with the patient. Literature supplied.   2) Diabetic complications:  Eye: Does not have known diabetic retinopathy.  Neuro/ Feet: Does  have known diabetic peripheral neuropathy .  Renal: Patient does not have known baseline CKD. She   is  on an ACEI/ARB at present.    3) Hypothyroidism:  - TSH within normal range at 4.2 uIU/mL earlier this year - No change    Medication  Levothyroxine 50 mcg daily     F/U in 6 months   Signed electronically by: Mack Guise, MD  [facility]  [facility] [address] [place], [place] [address] Phone: [phone] FAX: [phone]   CC: [name], [place] [address] Phone: [phone]  Fax: [phone]  Return to Endocrinology clinic as below: Future Appointments  Date Time Provider [place]  07/21/2022  1:30 PM Lazaro Arms, RN [facility] None  03/14/2024 12:30 PM [place],  Lavonna Monarch, NP [facility] None

## 2022-06-22 ENCOUNTER — Ambulatory Visit (INDEPENDENT_AMBULATORY_CARE_PROVIDER_SITE_OTHER): Payer: Medicare Other | Admitting: Internal Medicine

## 2022-06-22 ENCOUNTER — Encounter: Payer: Self-pay | Admitting: Internal Medicine

## 2022-06-22 VITALS — BP 126/74 | HR 70 | Ht 63.0 in | Wt 145.8 lb

## 2022-06-22 DIAGNOSIS — Z794 Long term (current) use of insulin: Secondary | ICD-10-CM | POA: Diagnosis not present

## 2022-06-22 DIAGNOSIS — E1140 Type 2 diabetes mellitus with diabetic neuropathy, unspecified: Secondary | ICD-10-CM

## 2022-06-22 DIAGNOSIS — E039 Hypothyroidism, unspecified: Secondary | ICD-10-CM

## 2022-06-22 NOTE — Patient Instructions (Signed)
-   Decrease Lantus 6 units daily  - Continue Metformin 500 mg ,2 tablet daily  - Continue  Rybelsus 7 mg daily     HOW TO TREAT LOW BLOOD SUGARS (Blood sugar LESS THAN 70 MG/DL) Please follow the RULE OF 15 for the treatment of hypoglycemia treatment (when your (blood sugars are less than 70 mg/dL)   STEP 1: Take 15 grams of carbohydrates when your blood sugar is low, which includes:  3-4 GLUCOSE TABS  OR 3-4 OZ OF JUICE OR REGULAR SODA OR ONE TUBE OF GLUCOSE GEL    STEP 2: RECHECK blood sugar in 15 MINUTES STEP 3: If your blood sugar is still low at the 15 minute recheck --> then, go back to STEP 1 and treat AGAIN with another 15 grams of carbohydrates.

## 2022-07-04 ENCOUNTER — Other Ambulatory Visit: Payer: Self-pay | Admitting: Student

## 2022-07-04 DIAGNOSIS — E1169 Type 2 diabetes mellitus with other specified complication: Secondary | ICD-10-CM

## 2022-07-04 DIAGNOSIS — K219 Gastro-esophageal reflux disease without esophagitis: Secondary | ICD-10-CM

## 2022-07-06 DIAGNOSIS — H401123 Primary open-angle glaucoma, left eye, severe stage: Secondary | ICD-10-CM | POA: Diagnosis not present

## 2022-07-06 DIAGNOSIS — E113311 Type 2 diabetes mellitus with moderate nonproliferative diabetic retinopathy with macular edema, right eye: Secondary | ICD-10-CM | POA: Diagnosis not present

## 2022-07-06 DIAGNOSIS — H524 Presbyopia: Secondary | ICD-10-CM | POA: Diagnosis not present

## 2022-07-06 DIAGNOSIS — E113312 Type 2 diabetes mellitus with moderate nonproliferative diabetic retinopathy with macular edema, left eye: Secondary | ICD-10-CM | POA: Diagnosis not present

## 2022-07-07 ENCOUNTER — Other Ambulatory Visit (HOSPITAL_COMMUNITY): Payer: Self-pay | Admitting: Adult Health

## 2022-07-07 DIAGNOSIS — R339 Retention of urine, unspecified: Secondary | ICD-10-CM

## 2022-07-07 DIAGNOSIS — N139 Obstructive and reflux uropathy, unspecified: Secondary | ICD-10-CM

## 2022-07-07 DIAGNOSIS — R3914 Feeling of incomplete bladder emptying: Secondary | ICD-10-CM

## 2022-07-07 DIAGNOSIS — N3020 Other chronic cystitis without hematuria: Secondary | ICD-10-CM

## 2022-07-11 DIAGNOSIS — R531 Weakness: Secondary | ICD-10-CM | POA: Diagnosis not present

## 2022-07-19 ENCOUNTER — Other Ambulatory Visit: Payer: Self-pay | Admitting: Student

## 2022-07-19 DIAGNOSIS — R350 Frequency of micturition: Secondary | ICD-10-CM

## 2022-07-19 DIAGNOSIS — R339 Retention of urine, unspecified: Secondary | ICD-10-CM | POA: Diagnosis not present

## 2022-07-21 ENCOUNTER — Ambulatory Visit: Payer: Self-pay

## 2022-07-22 NOTE — Patient Instructions (Signed)
Visit Information  Thank you for taking time to visit with me today. Please don't hesitate to contact me if I can be of assistance to you.   Following are the goals we discussed today:   Goals Addressed               This Visit's Progress     I will manage my DM and ongoing UTI (pt-stated)        Care Coordination Interventions: Advised patient, providing education and rationale, to check cbg and record, calling office for findings outside established parameters  Discussed plans with patient for ongoing care management follow up and provided patient with direct contact information for care management team Active listening / Reflection utilized  Emotional Support Provided        Our next appointment is by telephone on 12/29 at 2 pm  Please call the care guide team at [phone] if you need to cancel or reschedule your appointment.   If you are experiencing a Mental Health or [facility] or need someone to talk to, please call 1-800-273-TALK (toll free, 24 hour hotline)  Patient verbalizes understanding of instructions and care plan provided today and agrees to view in [place]. Active MyChart status and patient understanding of how to access instructions and care plan via MyChart confirmed with patient.     Lazaro Arms RN, BSN, [facility]   Phone: [phone]

## 2022-07-22 NOTE — Patient Outreach (Signed)
  Care Coordination   Follow Up Visit Note   07/21/2022 Name: Vennie Salsbury MRN: [id number] DOB: 1931/06/06  Michaelina Blandino is a 88 y.o. year old female who sees [name], MD for primary care. I  spoke woth Jackie Ranson the patients daughter.  What matters to the patients health and wellness today?  I recently had a conversation with [name], Mrs. Renville' daughter, who provided an update on Mrs. Rivenbark' health. According to [name], Mrs. Sosnowski is doing well despite having experienced another UTI, which has since been resolved. She will have a superpubic cather placed.  She has maintained a good appetite and her blood sugar levels range between 120 and 180, although they sometimes go up to the 200s if she has eaten something sweet. [name] requested that I continue to follow up and check on Mrs. Allender' health once every month.    Goals Addressed               This Visit's Progress     I will manage my DM and ongoing UTI (pt-stated)        Care Coordination Interventions: Advised patient, providing education and rationale, to check cbg and record, calling office for findings outside established parameters  Discussed plans with patient for ongoing care management follow up and provided patient with direct contact information for care management team Active listening / Reflection utilized  Emotional Support Provided        SDOH assessments and interventions completed:  No     Care Coordination Interventions:  Yes, provided   Follow up plan: Follow up call scheduled for 12/29 2 pm    Encounter Outcome:  Pt. Visit Completed   Lazaro Arms RN, BSN, [facility]   Phone: [phone]

## 2022-07-28 ENCOUNTER — Other Ambulatory Visit: Payer: Self-pay | Admitting: Radiology

## 2022-08-01 NOTE — H&P (Signed)
Chief Complaint: Patient was seen in consultation today for suprapubic catheter placement.  Referring Physician(s): Dilks,Jennaya L  Supervising Physician: Daryll Brod  Patient Status: [facility] - Out-pt  History of Present Illness: Hayley Fisher is a 88 y.o. female with a past medical history significant for dementia, arthritis, DM, HTN, HLD, DVT s/p IVC filter placement (2022), CKD, recurrent UTIs and urinary retention who presents today for suprapubic catheter placement.  Past Medical History:  Diagnosis Date   Arthritis    Cataract    bil cateracts removed   Chronic kidney disease    "spot on one of my kidneys" per pt   Diabetes mellitus    Diarrhea 02/20/2022   Gastroesophageal reflux disease without esophagitis 09/16/2020   Glaucoma    Goals of care, counseling/discussion 09/09/2021   Hyperkalemia 07/02/2020   Hyperlipidemia    Hyperplastic colon polyp    Hypertension    Hypothyroidism 12/09/2020   Leukocytopenia 28/41/3244   Metabolic acidosis 08/24/7251   S/P IVC filter 01/16/2021   DVT 12/2020   Syncope    Thyroid disease    Urge incontinence 05/05/2022    Past Surgical History:  Procedure Laterality Date   ABDOMINAL HYSTERECTOMY     BLADDER SUSPENSION     bladder tacking     COLONOSCOPY     EYE SURGERY     INGUINAL HERNIA REPAIR Right    IR IVC FILTER PLMT / S&I /IMG GUID/MOD SED  01/16/2021    Allergies: Tramadol and Levemir [insulin detemir]  Medications: Prior to Admission medications   Medication Sig Start Date End Date Taking? Authorizing Provider  acetaminophen (TYLENOL) 500 MG tablet Take 1,000 mg by mouth every 6 (six) hours as needed for moderate pain.   Yes [provider]  aspirin 81 MG chewable tablet Chew 81 mg by mouth daily.   Yes [provider]  atorvastatin (LIPITOR) 40 MG tablet TAKE 1 TABLET BY MOUTH EVERY DAY 07/05/22  Yes [name], MD  brimonidine (ALPHAGAN) 0.2 % ophthalmic solution Place 1 drop into  the left eye 2 (two) times daily. 03/30/21  Yes [provider]  carbamide peroxide (DEBROX) 6.5 % OTIC solution Place 2 drops into both ears 2 (two) times daily as needed (ear wax). 01/20/21  Yes Aline August, MD  cetirizine (ZYRTEC) 10 MG tablet Take 10 mg by mouth daily as needed for allergies.   Yes [provider]  clotrimazole-betamethasone (LOTRISONE) cream Apply 1 application topically 2 (two) times daily as needed (rash/itching). 05/13/21  Yes [provider]  dorzolamide-timolol (COSOPT) 22.3-6.8 MG/ML ophthalmic solution Place 1 drop into both eyes 2 (two) times daily. 05/18/20  Yes [provider]  doxepin (SINEQUAN) 10 MG/ML solution Take 1 mL (10 mg total) by mouth at bedtime as needed for sleep. 05/05/22  Yes McDiarmid, Blane Ohara, MD  famotidine (PEPCID) 20 MG tablet TAKE 1 TABLET BY MOUTH EVERY DAY 07/05/22  Yes [name], MD  FEROSUL 325 (65 Fe) MG tablet TAKE 1 TABLET BY MOUTH DAILY WITH BREAKFAST 03/29/22  Yes [name], MD  hydrocortisone (ANUSOL-HC) 2.5 % rectal cream Place 1 application rectally 2 (two) times daily as needed for hemorrhoids or anal itching. Please use twice daily x5 days, and then twice daily as needed. Patient taking differently: Place 1 application  rectally 2 (two) times daily as needed for hemorrhoids or anal itching. 07/02/21  Yes Eugenie Filler, MD  insulin glargine (LANTUS SOLOSTAR) 100 UNIT/ML Solostar Pen Inject 10 Units into the skin  every morning. Patient taking differently: Inject 8 Units into the skin every morning. 02/11/22  Yes Shamleffer, Melanie Crazier, MD  ketoconazole (NIZORAL) 2 % cream Apply 1 application topically daily as needed for irritation. 03/11/21  Yes [provider]  Latanoprostene Bunod (VYZULTA) 0.024 % SOLN Place 1 drop into both eyes at bedtime.   Yes [provider]  levothyroxine (SYNTHROID) 50 MCG tablet Take 1 tablet (50 mcg total) by mouth daily. 03/23/22  Yes  Shamleffer, Melanie Crazier, MD  metFORMIN (GLUCOPHAGE-XR) 500 MG 24 hr tablet Take 2 tablets (1,000 mg total) by mouth daily with breakfast. 02/11/22  Yes Shamleffer, Melanie Crazier, MD  MYRBETRIQ 25 MG TB24 tablet TAKE 1 TABLET BY MOUTH EVERY DAY 07/19/22  Yes Sowell, Erlene Quan, MD  polyethylene glycol powder (GLYCOLAX/MIRALAX) 17 GM/SCOOP powder Take 17 g by mouth in the morning and at bedtime. Patient taking differently: Take 17 g by mouth 2 (two) times daily as needed for mild constipation. 08/20/21  Yes [name], MD  SARNA lotion Apply 1 application topically 4 (four) times daily as needed for itching. 05/27/21  Yes [provider]  Semaglutide (RYBELSUS) 7 MG TABS Take 7 mg by mouth daily. 02/11/22  Yes Shamleffer, Melanie Crazier, MD  senna (SENOKOT) 8.6 MG TABS tablet Take 1 tablet (8.6 mg total) by mouth 2 (two) times daily. Patient taking differently: Take 1 tablet by mouth daily as needed for mild constipation. 08/09/21  Yes Eppie Gibson, MD  tamsulosin (FLOMAX) 0.4 MG CAPS capsule Take 0.4 mg by mouth every morning. 10/08/20  Yes [provider]  Accu-Chek Softclix Lancets lancets 1 each by Other route See admin instructions. USE TO TEST BLOOD SUGAR UP  TO 3 TIMES A DAY. 02/25/21   Jennye Boroughs, MD  Catheter Self-Adhesive Urinary MISC 1 patch by Does not apply route daily. 10/26/20   Richarda Osmond, MD  DULoxetine (CYMBALTA) 60 MG capsule Take 1 capsule (60 mg total) by mouth daily. Patient not taking: Reported on 08/01/2022 02/25/21   Jennye Boroughs, MD  GLOBAL EASE INJECT PEN NEEDLES 32G X 4 MM MISC USE 1 SYRINGE DAILY TO INJECT VICTOZA 09/13/21   Renato Shin, MD  glucose blood (ACCU-CHEK AVIVA PLUS) test strip USE TO TEST BLOOD SUGAR UP  TO 3 TIMES A DAY. 05/05/22   Shamleffer, Melanie Crazier, MD  Insulin Syringe-Needle U-100 (SURE COMFORT INSULIN SYRINGE) 31G X 5/16" 0.3 ML MISC Inject 1 Syringe into the skin daily. 02/25/21   Jennye Boroughs, MD  Insulin  Syringe-Needle U-100 (TRUEPLUS INSULIN SYRINGE) 31G X 5/16" 0.3 ML MISC Use as directed 02/24/22   Shamleffer, Melanie Crazier, MD  Misc. Devices (TRANSFER BOARD) MISC Use as needed for transfer from bed to chair or vice versa 02/25/21   Jennye Boroughs, MD     Family History  Problem Relation Age of Onset   Colon cancer Mother    Other Father        cerebral hemorrhage   Diabetes Brother    Diabetes Sister        x 2   Bone cancer Daughter    Breast cancer Daughter 60   Esophageal cancer Neg Hx    Rectal cancer Neg Hx    Stomach cancer Neg Hx     Social History   Socioeconomic History   Marital status: Single    Spouse name: Not on file   Number of children: 4   Years of education: Not on file   Highest education level: Not  on file  Occupational History   Occupation: retired  Tobacco Use   Smoking status: Former    Types: Cigarettes    Quit date: 11/29/1980    Years since quitting: 41.6   Smokeless tobacco: Never  Vaping Use   Vaping Use: Never used  Substance and Sexual Activity   Alcohol use: No    Alcohol/week: 0.0 standard drinks of alcohol   Drug use: No   Sexual activity: Yes    Birth control/protection: None  Other Topics Concern   Not on file  Social History Narrative   04/05/22   Patient lives at home with daughter Herbert Pun husband, and her sister who is 57 years old.    She has an aide that comes to her house in the mornings until the afternoon time and LaLeisa takes care of her after that.        Daughters say that she just got [employer] and they help her for 6 hours a day and are pleased with this Monday, Saturday and Sunday.    Ms Worthington says she would like "1 round of resuscitation efforts peLaLeisa says that her Chauncey Reading is her son Dyneisha Murchison [phone]).rformed" and then to call it if she were to be hospitalized.    Social Determinants of Health   Financial Resource Strain: Not on file  Food Insecurity: Not on file  Transportation Needs: Not on  file  Physical Activity: Not on file  Stress: Not on file  Social Connections: Not on file     Review of Systems: A 12 point ROS discussed and pertinent positives are indicated in the HPI above.  All other systems are negative.  Review of Systems  Constitutional:  Negative for chills and fever.  Respiratory:  Negative for cough and shortness of breath.   Cardiovascular:  Negative for chest pain.  Gastrointestinal:  Negative for abdominal pain, diarrhea, nausea and vomiting.  Genitourinary:  Positive for difficulty urinating.  Neurological:  Negative for dizziness and headaches.    Vital Signs: There were no vitals taken for this visit.  Physical Exam Vitals reviewed.  Constitutional:      General: She is not in acute distress. HENT:     Head: Normocephalic.     Mouth/Throat:     Mouth: Mucous membranes are moist.     Pharynx: Oropharynx is clear. No oropharyngeal exudate or posterior oropharyngeal erythema.  Cardiovascular:     Rate and Rhythm: Normal rate and regular rhythm.  Pulmonary:     Effort: Pulmonary effort is normal.     Breath sounds: Normal breath sounds.  Abdominal:     General: There is no distension.     Palpations: Abdomen is soft.     Tenderness: There is no abdominal tenderness.  Skin:    General: Skin is warm and dry.  Neurological:     Mental Status: She is alert and oriented to person, place, and time.  Psychiatric:        Mood and Affect: Mood normal.        Behavior: Behavior normal.        Thought Content: Thought content normal.        Judgment: Judgment normal.          Imaging: No results found.  Labs:  CBC: Recent Labs    11/05/21 0127 11/05/21 1552 11/06/21 1118 11/09/21 1528 11/12/21 1133  WBC 2.7* 3.0* 2.6*  --  3.5  HGB 9.0* 9.9* 10.5* 7.7* 10.5*  HCT 27.8* 30.1*  32.9*  --  31.7*  PLT 179 190 193  --  261    COAGS: No results for input(s): "INR", "APTT" in the last 8760 hours.  BMP: Recent Labs     08/09/21 0459 11/04/21 1120 11/05/21 0127 11/06/21 1118 02/18/22 1218 03/01/22 1515 04/05/22 1456  NA 133* 136 139 137 141 140 138  K 4.2 4.4 4.0 4.0 5.5* 5.1 4.9  CL 102 99 102 103 98 101 97  CO2 '26 27 28 26 24 25 27  '$ GLUCOSE 297* 281* 164* 186* 336* 211* 213*  BUN '21 15 17 17 19 18 15  '$ CALCIUM 8.5* 8.7* 8.7* 8.6* 9.5 9.3 9.3  CREATININE 1.10* 0.86 0.86 0.84 0.95 0.82 0.75  GFRNONAA 48* >60 >60 >60  --   --   --     LIVER FUNCTION TESTS: Recent Labs    08/08/21 1200 11/05/21 0127  BILITOT 0.8 0.4  AST 19 16  ALT 11 12  ALKPHOS 54 37*  PROT 6.1* 5.6*  ALBUMIN 3.3* 3.0*    TUMOR MARKERS: No results for input(s): "AFPTM", "CEA", "CA199", "CHROMGRNA" in the last 8760 hours.  Assessment and Plan:  88 y/o F with history of frequent UTIs and urinary retention who presents today for suprapubic catheter placement.  Risks and benefits discussed with the patient including bleeding, infection, damage to adjacent structures, bowel perforation and sepsis.  All of the patient's questions were answered, patient is agreeable to proceed.  Consent signed and in chart.  Thank you for this interesting consult.  I greatly enjoyed meeting Hayley Fisher and look forward to participating in their care.  A copy of this report was sent to the requesting provider on this date.  Electronically Signed: Joaquim Nam, PA-C 08/01/2022, 11:58 AM   I spent a total of  30 Minutes  in face to face in clinical consultation, greater than 50% of which was counseling/coordinating care for suprapubic catheter placement.

## 2022-08-02 ENCOUNTER — Other Ambulatory Visit (HOSPITAL_COMMUNITY): Payer: Self-pay | Admitting: Adult Health

## 2022-08-02 ENCOUNTER — Ambulatory Visit (HOSPITAL_COMMUNITY)
Admission: RE | Admit: 2022-08-02 | Discharge: 2022-08-02 | Disposition: A | Discharge status: Home / Self Care | Payer: Medicare Other | Source: Ambulatory Visit | Attending: Adult Health | Admitting: Adult Health

## 2022-08-02 ENCOUNTER — Encounter (HOSPITAL_COMMUNITY): Payer: Self-pay

## 2022-08-02 DIAGNOSIS — N3020 Other chronic cystitis without hematuria: Secondary | ICD-10-CM | POA: Insufficient documentation

## 2022-08-02 DIAGNOSIS — R3914 Feeling of incomplete bladder emptying: Secondary | ICD-10-CM

## 2022-08-02 DIAGNOSIS — N139 Obstructive and reflux uropathy, unspecified: Secondary | ICD-10-CM

## 2022-08-02 DIAGNOSIS — R339 Retention of urine, unspecified: Secondary | ICD-10-CM

## 2022-08-02 HISTORY — PX: IR US GUIDE BX ASP/DRAIN: IMG2392

## 2022-08-02 LAB — GLUCOSE, CAPILLARY
Glucose-Capillary: 57 mg/dL — ABNORMAL LOW (ref 70–99)
Glucose-Capillary: 91 mg/dL (ref 70–99)
Glucose-Capillary: 83 mg/dL (ref 70–99)

## 2022-08-02 MED ORDER — MIDAZOLAM HCL 2 MG/2ML IJ SOLN
INTRAMUSCULAR | Status: AC
  Filled 2022-08-02: qty 2

## 2022-08-02 MED ORDER — LIDOCAINE-EPINEPHRINE 1 %-1:100000 IJ SOLN
INTRAMUSCULAR | Status: AC
  Administered 2022-08-02: 10 mL
  Filled 2022-08-02: qty 1

## 2022-08-02 MED ORDER — IOHEXOL 300 MG/ML  SOLN
50.0000 mL | Freq: Once | INTRAMUSCULAR | Status: AC | PRN
  Administered 2022-08-02: 10 mL

## 2022-08-02 MED ORDER — FENTANYL CITRATE (PF) 100 MCG/2ML IJ SOLN
INTRAMUSCULAR | Status: AC | PRN
  Administered 2022-08-02 (×2): 25 ug via INTRAVENOUS

## 2022-08-02 MED ORDER — SODIUM CHLORIDE 0.9 % IV SOLN: INTRAVENOUS | Status: DC

## 2022-08-02 MED ORDER — LIDOCAINE HCL 1 % IJ SOLN
INTRAMUSCULAR | Status: AC
  Filled 2022-08-02: qty 20

## 2022-08-02 MED ORDER — DEXTROSE 50 % IV SOLN
INTRAVENOUS | Status: AC
  Administered 2022-08-02: 50 mL
  Filled 2022-08-02: qty 50

## 2022-08-02 MED ORDER — SODIUM CHLORIDE 0.9% FLUSH: 5.0000 mL | Freq: Three times a day (TID) | INTRAVENOUS | Status: DC

## 2022-08-02 MED ORDER — HYDRALAZINE HCL 20 MG/ML IJ SOLN
INTRAMUSCULAR | Status: AC
  Filled 2022-08-02: qty 1

## 2022-08-02 MED ORDER — FENTANYL CITRATE (PF) 100 MCG/2ML IJ SOLN
INTRAMUSCULAR | Status: AC
  Filled 2022-08-02: qty 2

## 2022-08-02 MED ORDER — DEXTROSE 50 % IV SOLN: 25.0000 mL | Freq: Once | INTRAVENOUS | Status: AC

## 2022-08-02 MED ORDER — MIDAZOLAM HCL 2 MG/2ML IJ SOLN
INTRAMUSCULAR | Status: AC | PRN
  Administered 2022-08-02: 1 mg via INTRAVENOUS

## 2022-08-02 NOTE — Procedures (Signed)
Interventional Radiology Procedure Note  Procedure: Image guided suprapubic catheter placement  Findings: Please refer to procedural dictation for full description. 14 Fr pigtail placed.  Complications: None immediate  Estimated Blood Loss: < 5 mL  Recommendations: OK to remove Foley. Keep to bag drainage. IR will arrange for 6 week follow up exchange to 16 Fr with sedation.   Ruthann Cancer, MD

## 2022-08-02 NOTE — Progress Notes (Signed)
Date and time results received: 08/02/22 0945   Test: CBG Critical Value: 30  Name of Provider Notified: Standing orders implemented per hypoglycemia protocol.   Orders Received? Or Actions Taken?:  1/2 AMP of D50 given. Will re-check CBG in 15 minutes.

## 2022-08-10 DIAGNOSIS — R531 Weakness: Secondary | ICD-10-CM | POA: Diagnosis not present

## 2022-08-16 ENCOUNTER — Other Ambulatory Visit: Payer: Self-pay | Admitting: Student

## 2022-08-16 DIAGNOSIS — I10 Essential (primary) hypertension: Secondary | ICD-10-CM

## 2022-08-18 DIAGNOSIS — R339 Retention of urine, unspecified: Secondary | ICD-10-CM | POA: Diagnosis not present

## 2022-08-19 ENCOUNTER — Ambulatory Visit: Payer: Self-pay

## 2022-08-19 NOTE — Patient Outreach (Signed)
  Care Coordination   Follow Up Visit Note   08/19/2022 Name: Hayley Fisher MRN: [id number] DOB: [birth year]  Hayley Fisher is a 88 y.o. year old female who sees Hayley Heck, MD for primary care. I spoke with  Hayley Fisher by phone today.  What matters to the patients health and wellness today?  Mother is doing better with the suprapubic catheter.     Goals Addressed               This Visit's Progress     I will manage my DM and ongoing UTI (pt-stated)        Care Coordination Interventions: Advised patient, providing education and rationale, to check cbg and record, calling office for findings outside established parameters  Discussed plans with patient for ongoing care management follow up and provided patient with direct contact information for care management team Active listening / Reflection utilized  Emotional Support Provided I had a conversation with Hayley Fisher, Hayley Fisher daughter. According to her, Hayley Fisher is doing well after the removal of the Foley catheter and placement of the suprapubic  catheter. Yesterday, she was evaluated at [facility] and is scheduled for a follow-up after six months. Her blood sugar levels are under 200. She is taking her medications and eating well. Hayley Fisher has requested that I call every month to stay informed about her mother's health.        SDOH assessments and interventions completed:  No     Care Coordination Interventions:  Yes, provided   Follow up plan: Follow up call scheduled for 09/21/22  230 pm    Encounter Outcome:  Pt. Visit Completed   Lazaro Arms RN, BSN, [facility]   Phone: [phone]

## 2022-08-19 NOTE — Patient Instructions (Signed)
Visit Information  Thank you for taking time to visit with me today. Please don't hesitate to contact me if I can be of assistance to you.   Following are the goals we discussed today:   Goals Addressed               This Visit's Progress     I will manage my DM and ongoing UTI (pt-stated)        Care Coordination Interventions: Advised patient, providing education and rationale, to check cbg and record, calling office for findings outside established parameters  Discussed plans with patient for ongoing care management follow up and provided patient with direct contact information for care management team Active listening / Reflection utilized  Emotional Support Provided I had a conversation with Hayley Fisher, Hayley Fisher daughter. According to her, Hayley Fisher is doing well after the removal of the Foley catheter and placement of the suprapubic  catheter. Yesterday, she was evaluated at [facility] and is scheduled for a follow-up after six months. Her blood sugar levels are under 200. She is taking her medications and eating well. Hayley Fisher has requested that I call every month to stay informed about her mother's health.        Our next appointment is by telephone on 09/21/22 at 230 pm  Please call the care guide team at [phone] if you need to cancel or reschedule your appointment.   If you are experiencing a Mental Health or [place] or need someone to talk to, please call 1-800-273-TALK (toll free, 24 hour hotline)  Patient verbalizes understanding of instructions and care plan provided today and agrees to view in [facility]. Active MyChart status and patient understanding of how to access instructions and care plan via MyChart confirmed with patient.     Lazaro Arms RN, BSN, [facility]   Phone: [phone]

## 2022-08-31 DIAGNOSIS — H401123 Primary open-angle glaucoma, left eye, severe stage: Secondary | ICD-10-CM | POA: Diagnosis not present

## 2022-08-31 DIAGNOSIS — E113313 Type 2 diabetes mellitus with moderate nonproliferative diabetic retinopathy with macular edema, bilateral: Secondary | ICD-10-CM | POA: Diagnosis not present

## 2022-09-01 ENCOUNTER — Other Ambulatory Visit (HOSPITAL_COMMUNITY): Payer: Self-pay | Admitting: Interventional Radiology

## 2022-09-01 DIAGNOSIS — R339 Retention of urine, unspecified: Secondary | ICD-10-CM

## 2022-09-07 DIAGNOSIS — H903 Sensorineural hearing loss, bilateral: Secondary | ICD-10-CM | POA: Insufficient documentation

## 2022-09-07 DIAGNOSIS — H90A21 Sensorineural hearing loss, unilateral, right ear, with restricted hearing on the contralateral side: Secondary | ICD-10-CM | POA: Diagnosis not present

## 2022-09-07 DIAGNOSIS — H90A32 Mixed conductive and sensorineural hearing loss, unilateral, left ear with restricted hearing on the contralateral side: Secondary | ICD-10-CM | POA: Diagnosis not present

## 2022-09-08 ENCOUNTER — Telehealth: Payer: Self-pay | Admitting: Student

## 2022-09-08 NOTE — Telephone Encounter (Signed)
Daughter  dropped off form at front desk for [place] .  Verified that patient section of form has been completed.  Last DOS/WCC with PCP was 05/05/22.  Placed form in blue team folder to be completed by clinical staff.  Creig Hines

## 2022-09-09 NOTE — Telephone Encounter (Signed)
Left some forms in your box for you to complete.

## 2022-09-10 DIAGNOSIS — R531 Weakness: Secondary | ICD-10-CM | POA: Diagnosis not present

## 2022-09-13 ENCOUNTER — Other Ambulatory Visit: Payer: Self-pay | Admitting: Student

## 2022-09-13 ENCOUNTER — Other Ambulatory Visit: Payer: Self-pay | Admitting: Radiology

## 2022-09-13 DIAGNOSIS — R350 Frequency of micturition: Secondary | ICD-10-CM

## 2022-09-14 ENCOUNTER — Other Ambulatory Visit: Payer: Self-pay | Admitting: Student

## 2022-09-14 ENCOUNTER — Other Ambulatory Visit: Payer: Self-pay

## 2022-09-14 DIAGNOSIS — E039 Hypothyroidism, unspecified: Secondary | ICD-10-CM

## 2022-09-14 MED ORDER — LEVOTHYROXINE SODIUM 50 MCG PO TABS: 50.0000 ug | ORAL_TABLET | Freq: Every day | ORAL | 1 refills | Status: DC

## 2022-09-14 NOTE — Telephone Encounter (Signed)
Forms faxed to [phone] with [employer].   Copy placed up front.   Copy placed in batch scanning.   VM left for daughter.

## 2022-09-15 ENCOUNTER — Ambulatory Visit (HOSPITAL_COMMUNITY)
Admission: RE | Admit: 2022-09-15 | Discharge: 2022-09-15 | Disposition: A | Discharge status: Home / Self Care | Payer: 59 | Source: Ambulatory Visit | Attending: Interventional Radiology | Admitting: Interventional Radiology

## 2022-09-15 DIAGNOSIS — Z7984 Long term (current) use of oral hypoglycemic drugs: Secondary | ICD-10-CM | POA: Insufficient documentation

## 2022-09-15 DIAGNOSIS — Z87891 Personal history of nicotine dependence: Secondary | ICD-10-CM | POA: Insufficient documentation

## 2022-09-15 DIAGNOSIS — Z435 Encounter for attention to cystostomy: Secondary | ICD-10-CM | POA: Insufficient documentation

## 2022-09-15 DIAGNOSIS — Z794 Long term (current) use of insulin: Secondary | ICD-10-CM | POA: Insufficient documentation

## 2022-09-15 DIAGNOSIS — N189 Chronic kidney disease, unspecified: Secondary | ICD-10-CM | POA: Insufficient documentation

## 2022-09-15 DIAGNOSIS — E1122 Type 2 diabetes mellitus with diabetic chronic kidney disease: Secondary | ICD-10-CM | POA: Diagnosis not present

## 2022-09-15 DIAGNOSIS — Z86718 Personal history of other venous thrombosis and embolism: Secondary | ICD-10-CM | POA: Diagnosis not present

## 2022-09-15 DIAGNOSIS — I129 Hypertensive chronic kidney disease with stage 1 through stage 4 chronic kidney disease, or unspecified chronic kidney disease: Secondary | ICD-10-CM | POA: Diagnosis not present

## 2022-09-15 DIAGNOSIS — R339 Retention of urine, unspecified: Secondary | ICD-10-CM | POA: Insufficient documentation

## 2022-09-15 HISTORY — PX: IR CATHETER TUBE CHANGE: IMG717

## 2022-09-15 LAB — GLUCOSE, CAPILLARY: Glucose-Capillary: 195 mg/dL — ABNORMAL HIGH (ref 70–99)

## 2022-09-15 MED ORDER — FENTANYL CITRATE (PF) 100 MCG/2ML IJ SOLN
INTRAMUSCULAR | Status: AC
  Filled 2022-09-15: qty 2

## 2022-09-15 MED ORDER — HYDRALAZINE HCL 20 MG/ML IJ SOLN
INTRAMUSCULAR | Status: AC
  Filled 2022-09-15: qty 1

## 2022-09-15 MED ORDER — IOHEXOL 300 MG/ML  SOLN
50.0000 mL | Freq: Once | INTRAMUSCULAR | Status: AC | PRN
  Administered 2022-09-15: 15 mL

## 2022-09-15 MED ORDER — MIDAZOLAM HCL 2 MG/2ML IJ SOLN
INTRAMUSCULAR | Status: AC | PRN
  Administered 2022-09-15 (×2): .5 mg via INTRAVENOUS

## 2022-09-15 MED ORDER — HYDRALAZINE HCL 20 MG/ML IJ SOLN
INTRAMUSCULAR | Status: AC | PRN
  Administered 2022-09-15: 10 mg via INTRAVENOUS

## 2022-09-15 MED ORDER — LIDOCAINE HCL 1 % IJ SOLN
INTRAMUSCULAR | Status: AC
  Filled 2022-09-15: qty 20

## 2022-09-15 MED ORDER — FENTANYL CITRATE (PF) 100 MCG/2ML IJ SOLN
INTRAMUSCULAR | Status: AC | PRN
  Administered 2022-09-15 (×2): 25 ug via INTRAVENOUS

## 2022-09-15 MED ORDER — SODIUM CHLORIDE 0.9 % IV SOLN: INTRAVENOUS | Status: DC

## 2022-09-15 MED ORDER — LIDOCAINE VISCOUS HCL 2 % MT SOLN
OROMUCOSAL | Status: AC
  Administered 2022-09-15: 5 mL
  Filled 2022-09-15: qty 15

## 2022-09-15 MED ORDER — MIDAZOLAM HCL 2 MG/2ML IJ SOLN
INTRAMUSCULAR | Status: AC
  Filled 2022-09-15: qty 2

## 2022-09-15 NOTE — H&P (Signed)
Chief Complaint: Patient was seen in consultation today for suprapubic catheter exchange  Referring Physician(s): Suttle,Dylan J  Supervising Physician: Sandi Mariscal  Patient Status: [facility] - Out-pt  History of Present Illness: Hayley Fisher is a 89 y.o. female with PMH significant for CKD, diabetes mellitus, DVT, hypertension, and syncope being seen today for image-guided suprapubic catheter exchange. The patient is known to IR from IVC filter placement on 01/16/21 and from image-guided suprapubic catheter placement on 08/02/22. The patient presents today in her usual state of health.   Past Medical History:  Diagnosis Date   Arthritis    Cataract    bil cateracts removed   Chronic kidney disease    "spot on one of my kidneys" per pt   Diabetes mellitus    Diarrhea 02/20/2022   Gastroesophageal reflux disease without esophagitis 09/16/2020   Glaucoma    Goals of care, counseling/discussion 09/09/2021   Hyperkalemia 07/02/2020   Hyperlipidemia    Hyperplastic colon polyp    Hypertension    Hypothyroidism 12/09/2020   Leukocytopenia 32/95/1884   Metabolic acidosis 16/60/6301   S/P IVC filter 01/16/2021   DVT 12/2020   Syncope    Thyroid disease    Urge incontinence 05/05/2022    Past Surgical History:  Procedure Laterality Date   ABDOMINAL HYSTERECTOMY     BLADDER SUSPENSION     bladder tacking     COLONOSCOPY     EYE SURGERY     INGUINAL HERNIA REPAIR Right    IR IVC FILTER PLMT / S&I /IMG GUID/MOD SED  01/16/2021   IR US GUIDE BX ASP/DRAIN  08/02/2022    Allergies: Tramadol and Levemir [insulin detemir]  Medications: Prior to Admission medications   Medication Sig Start Date End Date Taking? Authorizing Provider  Accu-Chek Softclix Lancets lancets 1 each by Other route See admin instructions. USE TO TEST BLOOD SUGAR UP  TO 3 TIMES A DAY. 02/25/21  Yes Jennye Boroughs, MD  acetaminophen (TYLENOL) 500 MG tablet Take 1,000 mg by mouth every 6 (six) hours as  needed for moderate pain.   Yes [provider]  aspirin 81 MG chewable tablet Chew 81 mg by mouth daily.   Yes [provider]  atorvastatin (LIPITOR) 40 MG tablet TAKE 1 TABLET BY MOUTH EVERY DAY 07/05/22  Yes [name], MD  brimonidine (ALPHAGAN) 0.2 % ophthalmic solution Place 1 drop into the left eye 2 (two) times daily. 03/30/21  Yes [provider]  carbamide peroxide (DEBROX) 6.5 % OTIC solution Place 2 drops into both ears 2 (two) times daily as needed (ear wax). 01/20/21  Yes Aline August, MD  cetirizine (ZYRTEC) 10 MG tablet Take 10 mg by mouth daily as needed for allergies.   Yes [provider]  clotrimazole-betamethasone (LOTRISONE) cream Apply 1 application topically 2 (two) times daily as needed (rash/itching). 05/13/21  Yes [provider]  dorzolamide-timolol (COSOPT) 22.3-6.8 MG/ML ophthalmic solution Place 1 drop into both eyes 2 (two) times daily. 05/18/20  Yes [provider]  doxepin (SINEQUAN) 10 MG/ML solution Take 1 mL (10 mg total) by mouth at bedtime as needed for sleep. 05/05/22  Yes McDiarmid, Blane Ohara, MD  DULoxetine (CYMBALTA) 60 MG capsule Take 1 capsule (60 mg total) by mouth daily. 02/25/21  Yes Jennye Boroughs, MD  famotidine (PEPCID) 20 MG tablet TAKE 1 TABLET BY MOUTH EVERY DAY 07/05/22  Yes [name], MD  Ferrous Sulfate (IRON) 325 (65 Fe) MG TABS TAKE 1 TABLET BY MOUTH DAILY WITH  BREAKFAST 09/13/22  Yes [name], MD  GLOBAL EASE INJECT PEN NEEDLES 32G X 4 MM MISC USE 1 SYRINGE DAILY TO INJECT VICTOZA 09/13/21  Yes Renato Shin, MD  glucose blood (ACCU-CHEK AVIVA PLUS) test strip USE TO TEST BLOOD SUGAR UP  TO 3 TIMES A DAY. 05/05/22  Yes Shamleffer, Melanie Crazier, MD  insulin glargine (LANTUS SOLOSTAR) 100 UNIT/ML Solostar Pen Inject 10 Units into the skin every morning. Patient taking differently: Inject 8 Units into the skin every morning. 02/11/22  Yes Shamleffer, Melanie Crazier, MD  Insulin  Syringe-Needle U-100 (SURE COMFORT INSULIN SYRINGE) 31G X 5/16" 0.3 ML MISC Inject 1 Syringe into the skin daily. 02/25/21  Yes Jennye Boroughs, MD  Insulin Syringe-Needle U-100 (TRUEPLUS INSULIN SYRINGE) 31G X 5/16" 0.3 ML MISC Use as directed 02/24/22  Yes Shamleffer, Melanie Crazier, MD  Latanoprostene Bunod (VYZULTA) 0.024 % SOLN Place 1 drop into both eyes at bedtime.   Yes [provider]  levothyroxine (SYNTHROID) 50 MCG tablet Take 1 tablet (50 mcg total) by mouth daily. 09/14/22  Yes Shamleffer, Melanie Crazier, MD  metFORMIN (GLUCOPHAGE-XR) 500 MG 24 hr tablet Take 2 tablets (1,000 mg total) by mouth daily with breakfast. 02/11/22  Yes Shamleffer, Melanie Crazier, MD  Misc. Devices (TRANSFER BOARD) MISC Use as needed for transfer from bed to chair or vice versa 02/25/21  Yes Jennye Boroughs, MD  MYRBETRIQ 25 MG TB24 tablet TAKE 1 TABLET BY MOUTH EVERY DAY 09/13/22  Yes [name], MD  polyethylene glycol powder (GLYCOLAX/MIRALAX) 17 GM/SCOOP powder Take 17 g by mouth in the morning and at bedtime. Patient taking differently: Take 17 g by mouth 2 (two) times daily as needed for mild constipation. 08/20/21  Yes [name], MD  SARNA lotion Apply 1 application topically 4 (four) times daily as needed for itching. 05/27/21  Yes [provider]  Semaglutide (RYBELSUS) 7 MG TABS Take 7 mg by mouth daily. 02/11/22  Yes Shamleffer, Melanie Crazier, MD  senna (SENOKOT) 8.6 MG TABS tablet Take 1 tablet (8.6 mg total) by mouth 2 (two) times daily. Patient taking differently: Take 1 tablet by mouth daily as needed for mild constipation. 08/09/21  Yes Eppie Gibson, MD  tamsulosin (FLOMAX) 0.4 MG CAPS capsule Take 0.4 mg by mouth every morning. 10/08/20  Yes [provider]  Catheter Self-Adhesive Urinary MISC 1 patch by Does not apply route daily. 10/26/20   Richarda Osmond, MD  hydrocortisone (ANUSOL-HC) 2.5 % rectal cream Place 1 application rectally 2 (two) times daily  as needed for hemorrhoids or anal itching. Please use twice daily x5 days, and then twice daily as needed. Patient taking differently: Place 1 application  rectally 2 (two) times daily as needed for hemorrhoids or anal itching. 07/02/21   Eugenie Filler, MD  ketoconazole (NIZORAL) 2 % cream Apply 1 application topically daily as needed for irritation. 03/11/21   [provider]     Family History  Problem Relation Age of Onset   Colon cancer Mother    Other Father        cerebral hemorrhage   Diabetes Brother    Diabetes Sister        x 2   Bone cancer Daughter    Breast cancer Daughter 66   Esophageal cancer Neg Hx    Rectal cancer Neg Hx    Stomach cancer Neg Hx     Social History   Socioeconomic History   Marital status: Single    Spouse  name: Not on file   Number of children: 4   Years of education: Not on file   Highest education level: Not on file  Occupational History   Occupation: retired  Tobacco Use   Smoking status: Former    Types: Cigarettes    Quit date: 11/29/1980    Years since quitting: 41.8   Smokeless tobacco: Never  Vaping Use   Vaping Use: Never used  Substance and Sexual Activity   Alcohol use: No    Alcohol/week: 0.0 standard drinks of alcohol   Drug use: No   Sexual activity: Yes    Birth control/protection: None  Other Topics Concern   Not on file  Social History Narrative   04/05/22   Patient lives at home with daughter Herbert Pun husband, and her sister who is 2 years old.    She has an aide that comes to her house in the mornings until the afternoon time and LaLeisa takes care of her after that.        Daughters say that she just got [employer] and they help her for 6 hours a day and are pleased with this Monday, Saturday and Sunday.    Ms Carrithers says she would like "1 round of resuscitation efforts peLaLeisa says that her Chauncey Reading is her son Aleksis Jiggetts [phone]).rformed" and then to call it if she were to be  hospitalized.    Social Determinants of Health   Financial Resource Strain: Not on file  Food Insecurity: Not on file  Transportation Needs: Not on file  Physical Activity: Not on file  Stress: Not on file  Social Connections: Not on file    Review of Systems: A 12 point ROS discussed and pertinent positives are indicated in the HPI above.  All other systems are negative.  Review of Systems  Constitutional:  Negative for chills and fever.  Respiratory:  Negative for chest tightness and shortness of breath.   Cardiovascular:  Positive for leg swelling. Negative for chest pain.  Gastrointestinal:  Positive for nausea. Negative for abdominal pain, diarrhea and vomiting.  Neurological:  Negative for dizziness and headaches.  Psychiatric/Behavioral:  Negative for confusion.     Vital Signs: BP (!) 162/88   Pulse 91   Temp 98.9 F (37.2 C) (Oral)   Resp 16   Ht '5\' 2"'$  (1.575 m)   Wt 139 lb (63 kg)   SpO2 96%   BMI 25.42 kg/m    Physical Exam Vitals reviewed.  HENT:     Mouth/Throat:     Mouth: Mucous membranes are moist.  Cardiovascular:     Rate and Rhythm: Normal rate and regular rhythm.     Pulses: Normal pulses.     Heart sounds: Normal heart sounds.  Pulmonary:     Effort: Pulmonary effort is normal.     Breath sounds: Normal breath sounds.  Abdominal:     Palpations: Abdomen is soft.     Tenderness: There is no abdominal tenderness.     Comments: Suprapubic catheter in place  Musculoskeletal:     Right lower leg: Edema present.     Left lower leg: Edema present.  Skin:    General: Skin is warm and dry.  Neurological:     Mental Status: She is alert and oriented to person, place, and time.  Psychiatric:        Mood and Affect: Mood normal.        Behavior: Behavior normal.  Thought Content: Thought content normal.        Judgment: Judgment normal.     Imaging: No results found.  Labs:  CBC: Recent Labs    11/05/21 0127 11/05/21 1552  11/06/21 1118 11/09/21 1528 11/12/21 1133  WBC 2.7* 3.0* 2.6*  --  3.5  HGB 9.0* 9.9* 10.5* 7.7* 10.5*  HCT 27.8* 30.1* 32.9*  --  31.7*  PLT 179 190 193  --  261    COAGS: No results for input(s): "INR", "APTT" in the last 8760 hours.  BMP: Recent Labs    11/04/21 1120 11/05/21 0127 11/06/21 1118 02/18/22 1218 03/01/22 1515 04/05/22 1456  NA 136 139 137 141 140 138  K 4.4 4.0 4.0 5.5* 5.1 4.9  CL 99 102 103 98 101 97  CO2 '27 28 26 24 25 27  '$ GLUCOSE 281* 164* 186* 336* 211* 213*  BUN '15 17 17 19 18 15  '$ CALCIUM 8.7* 8.7* 8.6* 9.5 9.3 9.3  CREATININE 0.86 0.86 0.84 0.95 0.82 0.75  GFRNONAA >60 >60 >60  --   --   --     LIVER FUNCTION TESTS: Recent Labs    11/05/21 0127  BILITOT 0.4  AST 16  ALT 12  ALKPHOS 37*  PROT 5.6*  ALBUMIN 3.0*    TUMOR MARKERS: No results for input(s): "AFPTM", "CEA", "CA199", "CHROMGRNA" in the last 8760 hours.  Assessment and Plan:  Hayley Fisher is an 89 yo female being seen today for routine image-guided suprapubic catheter exchange. The patient presents today in her usual state of health.  Risks and benefits discussed with the patient including bleeding, infection, damage to adjacent structures, bowel perforation, and sepsis.  All of the patient's questions were answered, patient is agreeable to proceed. Consent signed and in chart.   Thank you for this interesting consult.  I greatly enjoyed meeting Hayley Fisher and look forward to participating in their care.  A copy of this report was sent to the requesting provider on this date.  Electronically Signed: Lura Em, PA-C 09/15/2022, 10:09 AM   I spent a total of   25 Minutes in face to face in clinical consultation, greater than 50% of which was counseling/coordinating care for suprapubic catheter exchange.

## 2022-09-15 NOTE — Procedures (Signed)
Pre procedural Dx: Urinary retention Post procedural Dx: Same  Successful fluoro guided exchange and conversion to a Council tip suprapubic catheter.  Suprapubic catheter connected to foley bag.  EBL: Trace Complications: None immediate  Ronny Bacon, MD Pager #: [phone]

## 2022-09-16 ENCOUNTER — Other Ambulatory Visit: Payer: Self-pay

## 2022-09-16 DIAGNOSIS — E1140 Type 2 diabetes mellitus with diabetic neuropathy, unspecified: Secondary | ICD-10-CM

## 2022-09-16 MED ORDER — GLOBAL EASE INJECT PEN NEEDLES 32G X 4 MM MISC: 2 refills | Status: DC

## 2022-09-21 ENCOUNTER — Telehealth: Payer: Self-pay

## 2022-09-21 NOTE — Patient Outreach (Signed)
  Care Coordination   09/21/2022 Name: Hayley Fisher MRN: [id number] DOB: 01-11-1931   Care Coordination Outreach Attempts:  An unsuccessful telephone outreach was attempted today to offer the patient information about available care coordination services as a benefit of their health plan.   Follow Up Plan:  Additional outreach attempts will be made to offer the patient care coordination information and services.   Encounter Outcome:  Pt. Visit Completed   Care Coordination Interventions:  No, not indicated    Lazaro Arms RN, BSN, [facility]   Phone: [phone]

## 2022-09-29 ENCOUNTER — Telehealth: Payer: Self-pay | Admitting: Student

## 2022-09-29 NOTE — Telephone Encounter (Signed)
Clinical info completed on FMLA form.  Placed form in PCP's box for completion.    When form is completed, please route note to "RN Team" and place in wall pocket in front office.   Keyundra Fant, CMA  

## 2022-09-29 NOTE — Telephone Encounter (Signed)
Daughter dropped off form at front desk for [employer].  Verified that patient section of form has been completed.  Last DOS/WCC with PCP was 05/05/22.  Placed form in blue team folder to be completed by clinical staff.  Creig Hines

## 2022-09-30 NOTE — Telephone Encounter (Signed)
Patient called and informed that forms are ready for pick up. Copy made and placed in batch scanning. Original placed at front desk for pick up.   Shady Padron C Lameka Disla, RN  

## 2022-10-10 NOTE — Patient Instructions (Signed)

## 2022-10-10 NOTE — Progress Notes (Unsigned)
I connected with  Hayley Fisher, daughter Hayley Fisher 10/11/2022 by a audio enabled telemedicine application and verified that I am speaking with the correct person using two identifiers.  Patient Location: Home  Provider Location: Home Office  I discussed the limitations of evaluation and management by telemedicine. The patient expressed understanding and agreed to proceed.  Subjective:   Hayley Fisher is a 89 y.o. female who presents for an Initial Medicare Annual Wellness Visit.  Review of Systems    Per HPI unless specifically indicated below.  Cardiac Risk Factors include: advanced age (>11mn, >>31women);female gender, and Hypertension.          Objective:       09/15/2022   12:21 PM 09/15/2022   12:05 PM 09/15/2022   11:50 AM  Vitals with BMI  Systolic [id number] Diastolic 65 80 72  Pulse 99 97 94    There were no vitals filed for this visit. There is no height or weight on file to calculate BMI.     09/15/2022    9:18 AM 08/02/2022    9:55 AM 04/05/2022   11:05 AM 02/18/2022   11:22 AM 11/12/2021   11:36 AM 11/09/2021    2:30 PM 11/06/2021   11:01 AM  Advanced Directives  Does Patient Have a Medical Advance Directive? Yes Yes No No No No No  Type of AParamedicof [place] will [place] will       Does patient want to make changes to medical advance directive? No - Patient declined No - Patient declined       Copy of HParkerin Chart? No - copy requested No - copy requested       Would patient like information on creating a medical advance directive?   No - Patient declined  No - Patient declined No - Patient declined     Current Medications (verified) Outpatient Encounter Medications as of 10/11/2022  Medication Sig   Accu-Chek Softclix Lancets lancets 1 each by Other route See admin instructions. USE TO TEST BLOOD SUGAR UP  TO 3 TIMES A DAY.   acetaminophen (TYLENOL) 500 MG tablet  Take 1,000 mg by mouth every 6 (six) hours as needed for moderate pain.   aspirin 81 MG chewable tablet Chew 81 mg by mouth daily.   atorvastatin (LIPITOR) 40 MG tablet TAKE 1 TABLET BY MOUTH EVERY DAY   brimonidine (ALPHAGAN) 0.2 % ophthalmic solution Place 1 drop into the left eye 2 (two) times daily.   carbamide peroxide (DEBROX) 6.5 % OTIC solution Place 2 drops into both ears 2 (two) times daily as needed (ear wax).   cetirizine (ZYRTEC) 10 MG tablet Take 10 mg by mouth daily as needed for allergies.   dorzolamide-timolol (COSOPT) 22.3-6.8 MG/ML ophthalmic solution Place 1 drop into both eyes 2 (two) times daily.   doxepin (SINEQUAN) 10 MG/ML solution Take 1 mL (10 mg total) by mouth at bedtime as needed for sleep.   DULoxetine (CYMBALTA) 60 MG capsule Take 1 capsule (60 mg total) by mouth daily.   famotidine (PEPCID) 20 MG tablet TAKE 1 TABLET BY MOUTH EVERY DAY   Ferrous Sulfate (IRON) 325 (65 Fe) MG TABS TAKE 1 TABLET BY MOUTH DAILY WITH BREAKFAST   insulin glargine (LANTUS SOLOSTAR) 100 UNIT/ML Solostar Pen Inject 10 Units into the skin every morning. (Patient taking differently: Inject 6 Units into the skin every morning.)   Insulin Pen Needle (GLOBAL EASE  INJECT PEN NEEDLES) 32G X 4 MM MISC USE 1 SYRINGE DAILY TO INJECT VICTOZA   Insulin Syringe-Needle U-100 (SURE COMFORT INSULIN SYRINGE) 31G X 5/16" 0.3 ML MISC Inject 1 Syringe into the skin daily.   Insulin Syringe-Needle U-100 (TRUEPLUS INSULIN SYRINGE) 31G X 5/16" 0.3 ML MISC Use as directed   ketoconazole (NIZORAL) 2 % cream Apply 1 application topically daily as needed for irritation.   Latanoprostene Bunod (VYZULTA) 0.024 % SOLN Place 1 drop into both eyes at bedtime.   levothyroxine (SYNTHROID) 50 MCG tablet Take 1 tablet (50 mcg total) by mouth daily.   metFORMIN (GLUCOPHAGE-XR) 500 MG 24 hr tablet Take 2 tablets (1,000 mg total) by mouth daily with breakfast.   Misc. Devices (TRANSFER BOARD) MISC Use as needed for transfer  from bed to chair or vice versa   MYRBETRIQ 25 MG TB24 tablet TAKE 1 TABLET BY MOUTH EVERY DAY   polyethylene glycol powder (GLYCOLAX/MIRALAX) 17 GM/SCOOP powder Take 17 g by mouth in the morning and at bedtime. (Patient taking differently: Take 17 g by mouth 2 (two) times daily as needed for mild constipation.)   SARNA lotion Apply 1 application topically 4 (four) times daily as needed for itching.   Semaglutide (RYBELSUS) 7 MG TABS Take 7 mg by mouth daily.   senna (SENOKOT) 8.6 MG TABS tablet Take 1 tablet (8.6 mg total) by mouth 2 (two) times daily. (Patient taking differently: Take 1 tablet by mouth daily as needed for mild constipation.)   tamsulosin (FLOMAX) 0.4 MG CAPS capsule Take 0.4 mg by mouth every morning.   Catheter Self-Adhesive Urinary MISC 1 patch by Does not apply route daily. (Patient not taking: Reported on 10/11/2022)   glucose blood (ACCU-CHEK AVIVA PLUS) test strip USE TO TEST BLOOD SUGAR UP  TO 3 TIMES A DAY.   hydrocortisone (ANUSOL-HC) 2.5 % rectal cream Place 1 application rectally 2 (two) times daily as needed for hemorrhoids or anal itching. Please use twice daily x5 days, and then twice daily as needed. (Patient not taking: Reported on 10/11/2022)   [DISCONTINUED] clotrimazole-betamethasone (LOTRISONE) cream Apply 1 application topically 2 (two) times daily as needed (rash/itching). (Patient not taking: Reported on 10/11/2022)   No facility-administered encounter medications on file as of 10/11/2022.    Allergies (verified) Tramadol and Levemir [insulin detemir]   History: Past Medical History:  Diagnosis Date   Arthritis    Cataract    bil cateracts removed   Chronic kidney disease    "spot on one of my kidneys" per pt   Diabetes mellitus    Diarrhea 02/20/2022   Gastroesophageal reflux disease without esophagitis 09/16/2020   Glaucoma    Goals of care, counseling/discussion 09/09/2021   Hyperkalemia 07/02/2020   Hyperlipidemia    Hyperplastic colon polyp     Hypertension    Hypothyroidism 12/09/2020   Leukocytopenia [id number]   Metabolic acidosis [id number]   S/P IVC filter 01/16/2021   DVT 12/2020   Syncope    Thyroid disease    Urge incontinence 05/05/2022   Past Surgical History:  Procedure Laterality Date   ABDOMINAL HYSTERECTOMY     BLADDER SUSPENSION     bladder tacking     COLONOSCOPY     EYE SURGERY     INGUINAL HERNIA REPAIR Right    IR CATHETER TUBE CHANGE  09/15/2022   IR IVC FILTER PLMT / S&I /IMG GUID/MOD SED  01/16/2021   IR US GUIDE BX ASP/DRAIN  08/02/2022   Family History  Problem  Relation Age of Onset   Colon cancer Mother    Other Father        cerebral hemorrhage   Diabetes Brother    Diabetes Sister        x 2   Bone cancer Daughter    Breast cancer Daughter 50   Esophageal cancer Neg Hx    Rectal cancer Neg Hx    Stomach cancer Neg Hx    Social History   Socioeconomic History   Marital status: Single    Spouse name: Not on file   Number of children: 4   Years of education: Not on file   Highest education level: Not on file  Occupational History   Occupation: retired  Tobacco Use   Smoking status: Former    Types: Cigarettes    Quit date: 11/29/1980    Years since quitting: 41.8   Smokeless tobacco: Never  Vaping Use   Vaping Use: Never used  Substance and Sexual Activity   Alcohol use: No    Alcohol/week: 0.0 standard drinks of alcohol   Drug use: No   Sexual activity: Yes    Birth control/protection: None  Other Topics Concern   Not on file  Social History Narrative   04/05/22   Patient lives at home with daughter Hayley Fisher husband, and her sister who is 24 years old.    She has an aide that comes to her house in the mornings until the afternoon time and Hayley Fisher takes care of her after that.        Daughters say that she just got [employer] and they help her for 6 hours a day and are pleased with this Monday, Saturday and Sunday.    Hayley Fisher says she would like "1 round of  resuscitation efforts peLaLeisa says that her Hayley Fisher is her son Maridel Davin [phone]).rformed" and then to call it if she were to be hospitalized.    Social Determinants of Health   Financial Resource Strain: Low Risk  (10/11/2022)   Overall Financial Resource Strain (CARDIA)    Difficulty of Paying Living Expenses: Not hard at all  Food Insecurity: No Food Insecurity (10/11/2022)   Hunger Vital Sign    Worried About Running Out of Food in the Last Year: Never true    Ran Out of Food in the Last Year: Never true  Transportation Needs: No Transportation Needs (10/11/2022)   PRAPARE - Hydrologist (Medical): No    Lack of Transportation (Non-Medical): No  Physical Activity: Inactive (10/11/2022)   Exercise Vital Sign    Days of Exercise per Week: 0 days    Minutes of Exercise per Session: 0 min  Stress: No Stress Concern Present (10/11/2022)   [place]    Feeling of Stress : Not at all  Social Connections: Moderately Integrated (10/11/2022)   Social Connection and Isolation Panel [NHANES]    Frequency of Communication with Friends and Family: More than three times a week    Frequency of Social Gatherings with Friends and Family: More than three times a week    Attends Religious Services: More than 4 times per year    Active Member of [employer] or Organizations: Yes    Attends Music therapist: More than 4 times per year    Marital Status: Never married    Tobacco Counseling Counseling given: No   Clinical Intake:  Pre-visit preparation completed: No  Pain :  No/denies pain     Nutritional Status: BMI of 19-24  Normal Nutritional Risks: Nausea/ vomitting/ diarrhea Diabetes: Yes CBG done?: Yes CBG resulted in Enter/ Edit results?: Yes Did pt. bring in CBG monitor from home?: No  How often do you need to have someone help you when you read instructions, pamphlets, or  other written materials from your doctor or pharmacy?: 5 - Always  Diabetic?Nutrition Risk Assessment:  Has the patient had any N/V/D within the last 2 months?  Yes  Does the patient have any non-healing wounds?  No  Has the patient had any unintentional weight loss or weight gain?  No   Diabetes:  Is the patient diabetic?  Yes  If diabetic, was a CBG obtained today?  Yes  Did the patient bring in their glucometer from home?  No  How often do you monitor your CBG's? Daily .   Financial Strains and Diabetes Management:  Are you having any financial strains with the device, your supplies or your medication? No .  Does the patient want to be seen by Chronic Care Management for management of their diabetes?  No  Would the patient like to be referred to a Nutritionist or for Diabetic Management?  No   Diabetic Exams:  Diabetic Eye Exam: Completed 05/04/2022 Diabetic Foot Exam: Overdue, Pt has been advised about the importance in completing this exam. Pt is scheduled for diabetic foot exam on next diabetic appt.   Interpreter Needed?: No  Information entered by :: Donnie Mesa, cMA   Activities of Daily Living    10/11/2022   10:41 AM 11/04/2021    3:38 PM  In your present state of health, do you have any difficulty performing the following activities:  Hearing? 1 0  Comment Audiology appt next month   Vision? 1 1  Difficulty concentrating or making decisions? 1 0  Walking or climbing stairs? 1 1  Dressing or bathing? 1 0  Doing errands, shopping? 1 1    Patient Care Team: [name], MD as PCP - General (Student) Lazaro Arms, RN as Case Manager  Indicate any recent Medical Services you may have received from other than [facility] providers in the past year (date may be approximate).     Assessment:   This is a routine wellness examination for Hayley Fisher.  Hearing/Vision screen The pt admits to some hearing loss. She has an upcoming appt on March 19th with a  Audiologist. Annual Eye Exam, [facility], wear glasses. No vision changes.   Dietary issues and exercise activities discussed: Current Exercise Habits: The patient does not participate in regular exercise at present, Exercise limited by: orthopedic condition(s);Other - see comments (Wheelchair bound)   Goals Addressed   None    Depression Screen    10/11/2022   10:41 AM 05/05/2022    1:40 PM 04/05/2022   11:05 AM 03/01/2022    2:36 PM 02/18/2022   12:07 PM 11/12/2021   11:45 AM 11/09/2021    2:30 PM  PHQ 2/9 Scores  PHQ - 2 Score 0 0 0 0 0 0 0  PHQ- 9 Score  0 0 1 1 0 0    Fall Risk    10/11/2022   10:41 AM 05/05/2022    1:40 PM 04/05/2022   11:06 AM 05/11/2021   11:00 AM 03/04/2021   11:04 AM  Fall Risk   Fisher in the past year? 0 0 0 0 1  Number Fisher in past yr: 0 0 0  0 1  Injury with Fall? 0 0 0 0 1  Risk for fall due to : No Fall Risks  Impaired balance/gait;Impaired mobility;History of fall(s)  History of fall(s);Impaired mobility  Follow up Fisher evaluation completed    Fisher evaluation completed;Fisher prevention discussed    FALL RISK PREVENTION PERTAINING TO THE HOME:  Any stairs in or around the home? No  If so, are there any without handrails? No  Home free of loose throw rugs in walkways, pet beds, electrical cords, etc? Yes  Adequate lighting in your home to reduce risk of Fisher? Yes   ASSISTIVE DEVICES UTILIZED TO PREVENT Fisher:  Life alert? No  Use of a cane, walker or w/c? Yes  Grab bars in the bathroom? Yes  Shower chair or bench in shower? Yes  Elevated toilet seat or a handicapped toilet? Yes   TIMED UP AND GO:  Was the test performed? Unable to perform, virtual appointment   Cognitive Function:      05/06/2022    8:24 AM  Montreal Cognitive Assessment   Naming (0/3) 2  Attention: Read list of digits (0/2) 2  Attention: Read list of letters (0/1) 1  Attention: Serial 7 subtraction starting at 100 (0/3) 1  Language: Repeat phrase  (0/2) 2  Language : Fluency (0/1) 1  Abstraction (0/2) 0  Delayed Recall (0/5) 0  Orientation (0/6) 3      10/11/2022   10:43 AM  6CIT Screen  What Year? 0 points  What month? 0 points  What time? 0 points  Count back from 20 0 points  Months in reverse 0 points  Repeat phrase 0 points  Total Score 0 points    Immunizations Immunization History  Administered Date(s) Administered   Fluad Quad(high Dose 65+) 08/05/2019, 08/17/2020, 05/11/2021   Influenza Split 07/22/2011, 07/30/2012   Influenza Whole 06/09/2008   Influenza, High Dose Seasonal PF 10/16/2018   Influenza,inj,Quad PF,6+ Mos 07/03/2014, 09/09/2015, 08/09/2016   PFIZER(Purple Top)SARS-COV-2 Vaccination 09/23/2019, 09/30/2019, 06/09/2020   Pneumococcal Conjugate-13 07/03/2014   Pneumococcal Polysaccharide-23 08/22/1998   Td 08/22/2001    TDAP status: Up to date  Flu Vaccine status: Due, Education has been provided regarding the importance of this vaccine. Advised may receive this vaccine at local pharmacy or Health Dept. Aware to provide a copy of the vaccination record if obtained from local pharmacy or Health Dept. Verbalized acceptance and understanding.  Pneumococcal vaccine status: Up to date  Covid-19 vaccine status: Information provided on how to obtain vaccines.   Qualifies for Shingles Vaccine? Yes   Zostavax completed Yes   Shingrix Completed?: Yes  Screening Tests Health Maintenance  Topic Date Due   Zoster Vaccines- Shingrix (1 of 2) Never done   DTaP/Tdap/Td (2 - Tdap) 08/23/2011   COVID-19 Vaccine (4 - 2023-24 season) 04/22/2022   FOOT EXAM  06/09/2022   INFLUENZA VACCINE  11/20/2022 (Originally 03/22/2022)   HEMOGLOBIN A1C  11/03/2022   OPHTHALMOLOGY EXAM  05/05/2023   Medicare Annual Wellness (AWV)  10/12/2023   Pneumonia Vaccine 38+ Years old  Completed   DEXA SCAN  Completed   HPV VACCINES  Aged Out    Health Maintenance  Health Maintenance Due  Topic Date Due   Zoster Vaccines-  Shingrix (1 of 2) Never done   DTaP/Tdap/Td (2 - Tdap) 08/23/2011   COVID-19 Vaccine (4 - 2023-24 season) 04/22/2022   FOOT EXAM  06/09/2022    Colorectal cancer screening: No longer required.   Mammogram status: No longer required  due to age.   Dexa Scan:02/19/2010  Lung Cancer Screening: (Low Dose CT Chest recommended if Age 27-80 years, 30 pack-year currently smoking OR have quit w/in 15years.) does not qualify.   Lung Cancer Screening Referral: not applicable   Additional Screening:  Hepatitis C Screening: does not qualify; not applicable Screening: Recommended annual ophthalmology exams for early detection of glaucoma and other disorders of the eye. Is the patient up to date with their annual eye exam?  Yes  Who is the provider or what is the name of the office in which the patient attends annual eye exams? [facility] If pt is not established with a provider, would they like to be referred to a provider to establish care? No .   Dental Screening: Recommended annual dental exams for proper oral hygiene  Community Resource Referral / Chronic Care Management: CRR required this visit?  No   CCM required this visit?  No      Plan:     I have personally reviewed and noted the following in the patient's chart:   Medical and social history Use of alcohol, tobacco or illicit drugs  Current medications and supplements including opioid prescriptions. Patient is not currently taking opioid prescriptions. Functional ability and status Nutritional status Physical activity Advanced directives List of other physicians Hospitalizations, surgeries, and ER visits in previous 12 months Vitals Screenings to include cognitive, depression, and Fisher Referrals and appointments  In addition, I have reviewed and discussed with patient certain preventive protocols, quality metrics, and best practice recommendations. A written personalized care plan for preventive services  as well as general preventive health recommendations were provided to patient.    Hayley Fisher , Thank you for taking time to come for your Medicare Wellness Visit. I appreciate your ongoing commitment to your health goals. Please review the following plan we discussed and let me know if I can assist you in the future.   These are the goals we discussed:  Goals       Blood Pressure < 140/90      HEMOGLOBIN A1C < 8.0      I will manage my DM and ongoing UTI (pt-stated)      Care Coordination Interventions: Advised patient, providing education and rationale, to check cbg and record, calling office for findings outside established parameters  Discussed plans with patient for ongoing care management follow up and provided patient with direct contact information for care management team Active listening / Reflection utilized  Emotional Support Provided I had a conversation with Hayley Fisher, Hayley Fisher daughter. According to her, Mrs. Goonan is doing well after the removal of the Foley catheter and placement of the suprapubic  catheter. Yesterday, she was evaluated at [facility] and is scheduled for a follow-up after six months. Her blood sugar levels are under 200. She is taking her medications and eating well. Hayley Fisher has requested that I call every month to stay informed about her mother's health.      LDL CALC < 100        This is a list of the screening recommended for you and due dates:  Health Maintenance  Topic Date Due   Zoster (Shingles) Vaccine (1 of 2) Never done   DTaP/Tdap/Td vaccine (2 - Tdap) 08/23/2011   COVID-19 Vaccine (4 - 2023-24 season) 04/22/2022   Complete foot exam   06/09/2022   Flu Shot  11/20/2022*   Hemoglobin A1C  11/03/2022   Eye exam for diabetics  05/05/2023  Medicare Annual Wellness Visit  10/12/2023   Pneumonia Vaccine  Completed   DEXA scan (bone density measurement)  Completed   HPV Vaccine  Aged Out  *Topic was postponed. The date shown is not the  original due date.     Wilson Singer, [place]   10/11/2022   Nurse Notes: Approximately 30 minute Non-Face -To-Face Medicare Wellness Visit. The patient and her daughter wanted to know if there was any assistance with getting a life alert for the patient.

## 2022-10-11 ENCOUNTER — Ambulatory Visit (INDEPENDENT_AMBULATORY_CARE_PROVIDER_SITE_OTHER): Payer: 59

## 2022-10-11 DIAGNOSIS — Z0000 Encounter for general adult medical examination without abnormal findings: Secondary | ICD-10-CM | POA: Diagnosis not present

## 2022-10-11 DIAGNOSIS — R339 Retention of urine, unspecified: Secondary | ICD-10-CM | POA: Diagnosis not present

## 2022-10-11 DIAGNOSIS — Z993 Dependence on wheelchair: Secondary | ICD-10-CM

## 2022-10-12 ENCOUNTER — Telehealth: Payer: Self-pay | Admitting: *Deleted

## 2022-10-12 NOTE — Progress Notes (Unsigned)
  Care Coordination  Outreach Note  10/12/2022 Name: Hayley Fisher MRN: [id number] DOB: 04/10/1931   Care Coordination Outreach Attempts: An unsuccessful telephone outreach was attempted today to offer the patient information about available care coordination services as a benefit of their health plan.   Follow Up Plan:  Additional outreach attempts will be made to offer the patient care coordination information and services.   Encounter Outcome:  Pt. Request to Call [place]: [phone]

## 2022-10-13 NOTE — Progress Notes (Signed)
  Care Coordination   Note   10/13/2022 Name: Hayley Fisher MRN: [id number] DOB: [birth year]  Hayley Fisher is a 89 y.o. year old female who sees [name], MD for primary care. I reached out to Hayley Fisher by phone today to offer care coordination services.  Hayley Fisher was given information about Care Coordination services today including:   The Care Coordination services include support from the care team which includes your Nurse Coordinator, Clinical Social Worker, or Pharmacist.  The Care Coordination team is here to help remove barriers to the health concerns and goals most important to you. Care Coordination services are voluntary, and the patient may decline or stop services at any time by request to their care team member.   Care Coordination Consent Status: Patient did not agree to participate in care coordination services at this time.    Encounter Outcome:  Pt. Refused

## 2022-10-18 ENCOUNTER — Encounter: Payer: Self-pay | Admitting: Podiatry

## 2022-10-18 ENCOUNTER — Ambulatory Visit (INDEPENDENT_AMBULATORY_CARE_PROVIDER_SITE_OTHER): Payer: 59 | Admitting: Podiatry

## 2022-10-18 DIAGNOSIS — M79674 Pain in right toe(s): Secondary | ICD-10-CM

## 2022-10-18 DIAGNOSIS — M79675 Pain in left toe(s): Secondary | ICD-10-CM

## 2022-10-18 DIAGNOSIS — B351 Tinea unguium: Secondary | ICD-10-CM | POA: Diagnosis not present

## 2022-10-18 DIAGNOSIS — E1140 Type 2 diabetes mellitus with diabetic neuropathy, unspecified: Secondary | ICD-10-CM | POA: Diagnosis not present

## 2022-10-18 DIAGNOSIS — Z794 Long term (current) use of insulin: Secondary | ICD-10-CM | POA: Diagnosis not present

## 2022-10-18 NOTE — Progress Notes (Signed)
This patient returns to my office for at risk foot care.  This patient requires this care by a professional since this patient will be at risk due to having diabetes.  This patient is unable to cut nails herself since the patient cannot reach her nails.These nails are painful walking and wearing shoes.  She presents to the office with her daughter in a wheelchair.   This patient presents for at risk foot care today.  General Appearance  Alert, conversant and in no acute stress.  Vascular  Dorsalis pedis and posterior tibial  pulses are  weakly palpable  bilaterally.  Capillary return is within normal limits  bilaterally. Temperature is within normal limits  bilaterally.  Neurologic  Senn-Weinstein monofilament wire test within normal limits  bilaterally. Muscle power within normal limits bilaterally.  Nails Thick disfigured discolored nails with subungual debris  from hallux to fifth toes bilaterally. No evidence of bacterial infection or drainage bilaterally.  Orthopedic  No limitations of motion  feet .  No crepitus or effusions noted.  No bony pathology or digital deformities noted.  Skin  normotropic skin with no porokeratosis noted bilaterally.  No signs of infections or ulcers noted.     Onychomycosis  Pain in right toes  Pain in left toes  Consent was obtained for treatment procedures.   Mechanical debridement of nails 1-5  bilaterally performed with a nail nipper.  Filed with dremel without incident.    Return office visit   3 months                   Told patient to return for periodic foot care and evaluation due to potential at risk complications.   Gardiner Barefoot DPM

## 2022-10-25 ENCOUNTER — Ambulatory Visit: Payer: 59 | Admitting: Family Medicine

## 2022-10-27 ENCOUNTER — Ambulatory Visit (INDEPENDENT_AMBULATORY_CARE_PROVIDER_SITE_OTHER): Payer: 59 | Admitting: Family Medicine

## 2022-10-27 ENCOUNTER — Encounter: Payer: Self-pay | Admitting: Family Medicine

## 2022-10-27 VITALS — BP 126/87 | HR 97

## 2022-10-27 DIAGNOSIS — R110 Nausea: Secondary | ICD-10-CM

## 2022-10-27 DIAGNOSIS — Z794 Long term (current) use of insulin: Secondary | ICD-10-CM | POA: Diagnosis not present

## 2022-10-27 DIAGNOSIS — K219 Gastro-esophageal reflux disease without esophagitis: Secondary | ICD-10-CM | POA: Diagnosis not present

## 2022-10-27 DIAGNOSIS — E1140 Type 2 diabetes mellitus with diabetic neuropathy, unspecified: Secondary | ICD-10-CM

## 2022-10-27 LAB — POCT GLYCOSYLATED HEMOGLOBIN (HGB A1C): HbA1c, POC (controlled diabetic range): 6.6 % (ref 0.0–7.0)

## 2022-10-27 MED ORDER — FAMOTIDINE 20 MG PO TABS: 20.0000 mg | ORAL_TABLET | Freq: Every day | ORAL | 0 refills | Status: DC

## 2022-10-27 NOTE — Patient Instructions (Addendum)
It was great to see you!  We will check some blood work today.  I will send you a MyChart message with the results or call if needed.  Lets restart your Pepcid and see if this helps with your nausea.  This may be related to your Semaglutide. Your A1c was 6.6% which is very well controlled. Let's stop your semaglutide and see if this helps as well.  Follow-up in 1 month, or sooner if you have other concerns.   Take care, Dr Rock Nephew

## 2022-10-27 NOTE — Progress Notes (Signed)
    SUBJECTIVE:   CHIEF COMPLAINT / HPI:   Nausea -immediately after eating, starts toward the end of her meal -for the past 2-3 weeks -taking nauzene (OTC sodium citrate dihydrate) with relief -no vomiting -normal BMs, takes miralax at baseline, no blood in stool -no abdominal pain -no weight loss that they are aware of -no med changes aside from decrease in her insulin from 8u to 6u, well controlled CBG at home -no fever, chest pain, back pain, or other complaints -no dysphagia  PERTINENT  PMH / PSH: T2DM, HTN, HLD, mood disorder, mild dementia, hypothyroidism  OBJECTIVE:   BP 126/87   Pulse 97   SpO2 100%   Gen: NAD, pleasant, sitting in wheelchair CV: RRR, normal S1/S2 Resp: Normal effort, lungs CTAB GI: normoactive bowel sounds, abdomen soft, nontender, nondistended Extremities: compression stockings in place bilateral lower extremities Skin: warm and dry, no rashes noted Neuro: alert, moves all extremities equally Psych: Normal affect and mood   ASSESSMENT/PLAN:   Nausea Postprandial nausea for a few weeks. No red flags on history (no vomiting, abdominal pain, weight loss, blood in stool etc). Unfortunately could not obtain accurate weight in the office as she could not stand on scale but daughter confident she has not lost weight. Etiology may be related to semaglutide use despite being on this for quite some time. A1c obtained today and very well-controlled at 6.6%. Will d/c rybelsus given excellent glycemic control (does not need such strict control given age/comorbidities) and see if this improves her nausea. If no improvement, patient to resume pepcid '20mg'$  daily for possible GERD. Follow up in 1 month.    Alcus Dad, MD [place]

## 2022-10-28 LAB — COMPREHENSIVE METABOLIC PANEL
ALT: 6 IU/L (ref 0–32)
AST: 17 IU/L (ref 0–40)
Albumin/Globulin Ratio: 2.1 (ref 1.2–2.2)
Albumin: 4.2 g/dL (ref 3.7–4.7)
Alkaline Phosphatase: 76 IU/L (ref 44–121)
BUN/Creatinine Ratio: 25 (ref 12–28)
BUN: 18 mg/dL (ref 8–27)
Bilirubin Total: 0.4 mg/dL (ref 0.0–1.2)
CO2: 25 mmol/L (ref 20–29)
Calcium: 9 mg/dL (ref 8.7–10.3)
Chloride: 101 mmol/L (ref 96–106)
Creatinine, Ser: 0.72 mg/dL (ref 0.57–1.00)
Globulin, Total: 2 g/dL (ref 1.5–4.5)
Glucose: 185 mg/dL — ABNORMAL HIGH (ref 70–99)
Potassium: 4.6 mmol/L (ref 3.5–5.2)
Sodium: 140 mmol/L (ref 134–144)
Total Protein: 6.2 g/dL (ref 6.0–8.5)
eGFR: 80 mL/min/{1.73_m2} (ref >59)

## 2022-10-28 LAB — CBC
Hematocrit: 32.7 % — ABNORMAL LOW (ref 34.0–46.6)
Hemoglobin: 10.9 g/dL — ABNORMAL LOW (ref 11.1–15.9)
MCH: 30.5 pg (ref 26.6–33.0)
MCHC: 33.3 g/dL (ref 31.5–35.7)
MCV: 92 fL (ref 79–97)
Platelets: 231 10*3/uL (ref 150–450)
RBC: 3.57 x10E6/uL — ABNORMAL LOW (ref 3.77–5.28)
RDW: 12.7 % (ref 11.7–15.4)
WBC: 4.5 10*3/uL (ref 3.4–10.8)

## 2022-10-28 NOTE — Assessment & Plan Note (Addendum)
Postprandial nausea for a few weeks. No red flags on history (no vomiting, abdominal pain, weight loss, blood in stool etc). Unfortunately could not obtain accurate weight in the office as she could not stand on scale but daughter confident she has not lost weight. Etiology may be related to semaglutide use despite being on this for quite some time. A1c obtained today and very well-controlled at 6.6%. Will d/c rybelsus given excellent glycemic control (does not need such strict control given age/comorbidities) and see if this improves her nausea. If no improvement, patient to resume pepcid '20mg'$  daily for possible GERD. Follow up in 1 month.

## 2022-11-02 DIAGNOSIS — R339 Retention of urine, unspecified: Secondary | ICD-10-CM | POA: Diagnosis not present

## 2022-11-08 DIAGNOSIS — H905 Unspecified sensorineural hearing loss: Secondary | ICD-10-CM | POA: Diagnosis not present

## 2022-11-15 ENCOUNTER — Ambulatory Visit (INDEPENDENT_AMBULATORY_CARE_PROVIDER_SITE_OTHER): Payer: 59 | Admitting: Student

## 2022-11-15 ENCOUNTER — Encounter: Payer: Self-pay | Admitting: Student

## 2022-11-15 VITALS — BP 135/82 | HR 102 | Ht 62.0 in | Wt 128.4 lb

## 2022-11-15 DIAGNOSIS — Z9359 Other cystostomy status: Secondary | ICD-10-CM

## 2022-11-15 DIAGNOSIS — R634 Abnormal weight loss: Secondary | ICD-10-CM | POA: Diagnosis not present

## 2022-11-15 DIAGNOSIS — R110 Nausea: Secondary | ICD-10-CM

## 2022-11-15 NOTE — Patient Instructions (Addendum)
It was great to see you! Thank you for allowing me to participate in your care!   Our plans for today:  - Please apply barrier cream and tefla pads around the catheter site - Please scehdule an appointment with urology - she may need her catheter switched out - return to care if having fevers, vomiting, abdominal pain   Take care and seek immediate care sooner if you develop any concerns.  [name], MD

## 2022-11-15 NOTE — Progress Notes (Unsigned)
    SUBJECTIVE:   CHIEF COMPLAINT / HPI: Nausea  Seen for nausea 3/7 and was taken off of Rybelsus.  Since then has not had any nausea and this is completely resolved.  Weight is down today but she says her appetite is good-has been eating/drinking well but slight decrease in appetite with her age  Blood in urine She also notices that she has been having some blood in her urinary catheter bag since Saturday. Suprapubic catheter placed in December due to frequent UTIs by urology Some discomfort at site of catheter but no abdominal/flank pain No vomiting or fevers No flank pain No drainage/pus around catheter  PERTINENT  PMH / PSH: wheelchair bound  OBJECTIVE:   BP 135/82   Pulse (!) 102   Ht 5\' 2"  (1.575 m)   Wt 128 lb 6.4 oz (58.2 kg)   SpO2 100%   BMI 23.48 kg/m    General: NAD, awake, alert, responsive to questions Head: Normocephalic atraumatic CV: Regular rate and rhythm no murmurs rubs or gallops Respiratory: Clear to ausculation bilaterally, no wheezes rales or crackles, chest rises symmetrically,  no increased work of breathing Abdomen: Soft, non-tender, non-distended, normoactive bowel sounds, no CVA tenderness, no breakdown around suprapubic catheter site however slightly erythematous + moist      ASSESSMENT/PLAN:   Nausea This has resolved after discontinuation of Rybelsus. -Monitor  Weight loss, unintentional Likely in the setting of semaglutide use.  This has been discontinued for the past 3 weeks so we will follow her weight loss after this.  Chronic suprapubic catheter ([place]) Some blood noticed since Saturday. No UTI sxs (no fever, abdominal pain, vomiting). Discussed to reach out to urology as catheter may need to be exchanged again.  Does have some moist areas around catheter sites that are slightly irritated.  I gave patient and daughter Telfa pads and tape to protect this area.  Barrier cream advised as well. -Urology f/u -Return precautions  discussed for UTI    [name], MD [place]

## 2022-11-16 DIAGNOSIS — N3021 Other chronic cystitis with hematuria: Secondary | ICD-10-CM | POA: Diagnosis not present

## 2022-11-16 NOTE — Assessment & Plan Note (Signed)
Some blood noticed since Saturday. No UTI sxs (no fever, abdominal pain, vomiting). Discussed to reach out to urology as catheter may need to be exchanged again.  Does have some moist areas around catheter sites that are slightly irritated.  I gave patient and daughter Telfa pads and tape to protect this area.  Barrier cream advised as well. -Urology f/u -Return precautions discussed for UTI

## 2022-11-16 NOTE — Assessment & Plan Note (Signed)
Likely in the setting of semaglutide use.  This has been discontinued for the past 3 weeks so we will follow her weight loss after this.

## 2022-11-16 NOTE — Assessment & Plan Note (Signed)
This has resolved after discontinuation of Rybelsus. -Monitor

## 2022-11-24 ENCOUNTER — Ambulatory Visit: Payer: 59 | Admitting: Student

## 2022-11-29 DIAGNOSIS — N3021 Other chronic cystitis with hematuria: Secondary | ICD-10-CM | POA: Diagnosis not present

## 2022-12-06 ENCOUNTER — Other Ambulatory Visit: Payer: Self-pay | Admitting: Student

## 2022-12-06 DIAGNOSIS — E1140 Type 2 diabetes mellitus with diabetic neuropathy, unspecified: Secondary | ICD-10-CM

## 2022-12-07 ENCOUNTER — Other Ambulatory Visit: Payer: Self-pay | Admitting: Internal Medicine

## 2022-12-07 DIAGNOSIS — Z794 Long term (current) use of insulin: Secondary | ICD-10-CM

## 2022-12-08 ENCOUNTER — Other Ambulatory Visit: Payer: Self-pay | Admitting: Internal Medicine

## 2022-12-08 ENCOUNTER — Other Ambulatory Visit: Payer: Self-pay | Admitting: Student

## 2022-12-08 DIAGNOSIS — R350 Frequency of micturition: Secondary | ICD-10-CM

## 2022-12-08 DIAGNOSIS — K219 Gastro-esophageal reflux disease without esophagitis: Secondary | ICD-10-CM

## 2022-12-08 DIAGNOSIS — E039 Hypothyroidism, unspecified: Secondary | ICD-10-CM

## 2022-12-13 DIAGNOSIS — R339 Retention of urine, unspecified: Secondary | ICD-10-CM | POA: Diagnosis not present

## 2022-12-26 ENCOUNTER — Encounter: Payer: Self-pay | Admitting: Internal Medicine

## 2022-12-26 ENCOUNTER — Ambulatory Visit (INDEPENDENT_AMBULATORY_CARE_PROVIDER_SITE_OTHER): Payer: 59 | Admitting: Internal Medicine

## 2022-12-26 VITALS — BP 110/68 | HR 91 | Ht 62.0 in | Wt 127.0 lb

## 2022-12-26 DIAGNOSIS — E1140 Type 2 diabetes mellitus with diabetic neuropathy, unspecified: Secondary | ICD-10-CM

## 2022-12-26 DIAGNOSIS — E039 Hypothyroidism, unspecified: Secondary | ICD-10-CM

## 2022-12-26 DIAGNOSIS — Z794 Long term (current) use of insulin: Secondary | ICD-10-CM | POA: Diagnosis not present

## 2022-12-26 MED ORDER — LEVOTHYROXINE SODIUM 50 MCG PO TABS: 50.0000 ug | ORAL_TABLET | Freq: Every day | ORAL | 3 refills | Status: DC

## 2022-12-26 MED ORDER — METFORMIN HCL ER 500 MG PO TB24: 1000.0000 mg | ORAL_TABLET | Freq: Every day | ORAL | 3 refills | Status: DC

## 2022-12-26 MED ORDER — GLOBAL EASE INJECT PEN NEEDLES 32G X 4 MM MISC: 3 refills | Status: DC

## 2022-12-26 MED ORDER — LANTUS SOLOSTAR 100 UNIT/ML ~~LOC~~ SOPN: 10.0000 [IU] | PEN_INJECTOR | SUBCUTANEOUS | 3 refills | Status: DC

## 2022-12-26 NOTE — Patient Instructions (Signed)
-   Increase  Lantus 10 units daily  - Continue Metformin 500 mg ,2 tablet daily    HOW TO TREAT LOW BLOOD SUGARS (Blood sugar LESS THAN 70 MG/DL) Please follow the RULE OF 15 for the treatment of hypoglycemia treatment (when your (blood sugars are less than 70 mg/dL)   STEP 1: Take 15 grams of carbohydrates when your blood sugar is low, which includes:  3-4 GLUCOSE TABS  OR 3-4 OZ OF JUICE OR REGULAR SODA OR ONE TUBE OF GLUCOSE GEL    STEP 2: RECHECK blood sugar in 15 MINUTES STEP 3: If your blood sugar is still low at the 15 minute recheck --> then, go back to STEP 1 and treat AGAIN with another 15 grams of carbohydrates.

## 2022-12-26 NOTE — Progress Notes (Signed)
Name: Hayley Fisher  Age/ Sex: 89 y.o., female   MRN/ DOB: [id number], [birth year]     PCP: Levin Erp, MD   Reason for Endocrinology Evaluation: Type 2 Diabetes Mellitus  Initial Endocrine Consultative Visit: 09/30/2020    PATIENT IDENTIFIER: [name] is a 89 y.o. female with a past medical history of T2DM, Hx DVT, DJD, Hypothyroidism. The patient has followed with Endocrinology clinic since 09/30/2020 for consultative assistance with management of her diabetes.  DIABETIC HISTORY:  [name] was diagnosed with DM 2000. Her hemoglobin A1c has ranged from 6.3% in 2022, peaking at 10.2% in 2021.   Rybelsus was discontinued 10/2022 due to nausea  SUBJECTIVE:   During the last visit (06/22/2022): A1c 6.5%     Today (12/26/2022): [name]. She checks her blood sugars 1 times daily. The patient has not  had hypoglycemic episodes since the last clinic visit.  She was seen by GI for nausea, Rybelsus was discontinued 10/27/2022  She was seen by podiatry 10/18/2022 Has a nursing aid to help at home  Sees Urology for supra-pubic catheter  Denies constipation or diarrhea    HOME DIABETES REGIMEN:  Lantus 6 units daily  Metformin 500 mg ,2 tablet daily      METER DOWNLOAD SUMMARY: unable to download 103-302 mg/dL   DIABETIC COMPLICATIONS: Microvascular complications:  Neuropathy  Denies: CKD  Last Eye Exam: Completed 08/31/2022  Macrovascular complications:   Denies: CAD, CVA, PVD   HISTORY:  Past Medical History:  Past Medical History:  Diagnosis Date   Arthritis    Cataract    bil cateracts removed   Chronic kidney disease    "spot on one of my kidneys" per pt   Diabetes mellitus    Diarrhea 02/20/2022   Gastroesophageal reflux disease without esophagitis 09/16/2020   Glaucoma    Goals of care, counseling/discussion 09/09/2021   Hyperkalemia 07/02/2020   Hyperlipidemia    Hyperplastic colon polyp    Hypertension    Hypothyroidism  12/09/2020   Leukocytopenia 06/27/2021   Metabolic acidosis 07/02/2020   S/P IVC filter 01/16/2021   DVT 12/2020   Syncope    Thyroid disease    Urge incontinence 05/05/2022   Past Surgical History:  Past Surgical History:  Procedure Laterality Date   ABDOMINAL HYSTERECTOMY     BLADDER SUSPENSION     bladder tacking     COLONOSCOPY     EYE SURGERY     INGUINAL HERNIA REPAIR Right    IR CATHETER TUBE CHANGE  09/15/2022   IR IVC FILTER PLMT / S&I /IMG GUID/MOD SED  01/16/2021   IR US GUIDE BX ASP/DRAIN  08/02/2022   Social History:  reports that she quit smoking about 42 years ago. Her smoking use included cigarettes. She has never used smokeless tobacco. She reports that she does not drink alcohol and does not use drugs. Family History:  Family History  Problem Relation Age of Onset   Colon cancer Mother    Other Father        cerebral hemorrhage   Diabetes Brother    Diabetes Sister        x 2   Bone cancer Daughter    Breast cancer Daughter 41   Esophageal cancer Neg Hx    Rectal cancer Neg Hx    Stomach cancer Neg Hx      HOME MEDICATIONS: Allergies as of 12/26/2022       Reactions   Tramadol Itching  Takes occasionally with benadryl   Levemir [insulin Detemir] Itching        Medication List        Accurate as of Dec 26, 2022 12:03 PM. If you have any questions, ask your nurse or doctor.          Accu-Chek Aviva Plus test strip Generic drug: glucose blood USE TO TEST BLOOD SUGAR UP  TO 3 TIMES A DAY.   Accu-Chek Softclix Lancets lancets 1 each by Other route See admin instructions. USE TO TEST BLOOD SUGAR UP  TO 3 TIMES A DAY.   acetaminophen 500 MG tablet Commonly known as: TYLENOL Take 1,000 mg by mouth every 6 (six) hours as needed for moderate pain.   aspirin 81 MG chewable tablet Chew 81 mg by mouth daily.   atorvastatin 40 MG tablet Commonly known as: LIPITOR TAKE 1 TABLET BY MOUTH EVERY DAY   brimonidine 0.2 % ophthalmic  solution Commonly known as: ALPHAGAN Place 1 drop into the left eye 2 (two) times daily.   Catheter Self-Adhesive Urinary Misc 1 patch by Does not apply route daily.   cetirizine 10 MG tablet Commonly known as: ZYRTEC Take 10 mg by mouth daily as needed for allergies.   Debrox 6.5 % OTIC solution Generic drug: carbamide peroxide Place 2 drops into both ears 2 (two) times daily as needed (ear wax).   dorzolamide-timolol 2-0.5 % ophthalmic solution Commonly known as: COSOPT Place 1 drop into both eyes 2 (two) times daily.   doxepin 10 MG/ML solution Commonly known as: SINEQUAN Take 1 mL (10 mg total) by mouth at bedtime as needed for sleep.   DULoxetine 60 MG capsule Commonly known as: CYMBALTA Take 1 capsule (60 mg total) by mouth daily.   famotidine 20 MG tablet Commonly known as: PEPCID TAKE 1 TABLET BY MOUTH EVERY DAY   Global Ease Inject Pen Needles 32G X 4 MM Misc Generic drug: Insulin Pen Needle USE 1 SYRINGE DAILY TO INJECT VICTOZA   Insulin Syringe-Needle U-100 31G X 5/16" 0.3 ML Misc Commonly known as: Sure Comfort Insulin Syringe Inject 1 Syringe into the skin daily.   Insulin Syringe-Needle U-100 31G X 5/16" 0.3 ML Misc Commonly known as: TRUEplus Insulin Syringe Use as directed   Iron 325 (65 Fe) MG Tabs TAKE 1 TABLET BY MOUTH DAILY WITH BREAKFAST   ketoconazole 2 % cream Commonly known as: NIZORAL Apply 1 application topically daily as needed for irritation.   Lantus SoloStar 100 UNIT/ML Solostar Pen Generic drug: insulin glargine Inject 6 Units into the skin every morning.   levothyroxine 50 MCG tablet Commonly known as: SYNTHROID TAKE 1 TABLET BY MOUTH DAILY   metFORMIN 500 MG 24 hr tablet Commonly known as: GLUCOPHAGE-XR Take 2 tablets (1,000 mg total) by mouth daily with breakfast.   Myrbetriq 25 MG Tb24 tablet Generic drug: mirabegron ER TAKE 1 TABLET BY MOUTH EVERY DAY   polyethylene glycol powder 17 GM/SCOOP powder Commonly known  as: GLYCOLAX/MIRALAX Take 17 g by mouth in the morning and at bedtime. What changed:  when to take this reasons to take this   Rybelsus 7 MG Tabs Generic drug: Semaglutide Take 7 mg by mouth daily.   Sarna lotion Generic drug: camphor-menthol Apply 1 application topically 4 (four) times daily as needed for itching.   senna 8.6 MG Tabs tablet Commonly known as: SENOKOT Take 1 tablet (8.6 mg total) by mouth 2 (two) times daily. What changed:  when to take this reasons to take  this   tamsulosin 0.4 MG Caps capsule Commonly known as: FLOMAX Take 0.4 mg by mouth every morning.   Transfer Board Misc Use as needed for transfer from bed to chair or vice versa   Vyzulta 0.024 % Soln Generic drug: Latanoprostene Bunod Place 1 drop into both eyes at bedtime.         OBJECTIVE:   Vital Signs: BP (!) 148/80 (BP Location: Left Arm, Patient Position: Sitting, Cuff Size: Normal)   Pulse 91   Ht 5\' 2"  (1.575 m)   Wt 127 lb (57.6 kg)   SpO2 99%   BMI 23.23 kg/m   Wt Readings from Last 3 Encounters:  12/26/22 127 lb (57.6 kg)  11/15/22 128 lb 6.4 oz (58.2 kg)  09/15/22 139 lb (63 kg)     Exam: General: Pt appears well and is in NAD  Neck: General: Supple without adenopathy. Thyroid: Thyroid size normal.  No goiter or nodules appreciated.  Lungs: Clear with good BS bilat with no rales, rhonchi, or wheezes  Heart: RRR   Extremities: Trace  pretibial edema.   Neuro: MS is good with appropriate affect, pt is alert and Ox3      DATA REVIEWED:  Lab Results  Component Value Date   HGBA1C 6.6 10/27/2022   HGBA1C 6.5 05/05/2022   HGBA1C 8.8 (A) 02/11/2022    Latest Reference Range & Units 10/27/22 16:18  Sodium 134 - 144 mmol/L 140  Potassium 3.5 - 5.2 mmol/L 4.6  Chloride 96 - 106 mmol/L 101  CO2 20 - 29 mmol/L 25  Glucose 70 - 99 mg/dL 409 (H)  BUN 8 - 27 mg/dL 18  Creatinine 8.11 - 9.14 mg/dL 7.82  Calcium 8.7 - 95.6 mg/dL 9.0  BUN/Creatinine Ratio 12 - 28  25   eGFR >59 mL/min/1.73 80  Alkaline Phosphatase 44 - 121 IU/L 76  Albumin 3.7 - 4.7 g/dL 4.2  Albumin/Globulin Ratio 1.2 - 2.2  2.1  AST 0 - 40 IU/L 17  ALT 0 - 32 IU/L 6  Total Protein 6.0 - 8.5 g/dL 6.2  Total Bilirubin 0.0 - 1.2 mg/dL 0.4    ASSESSMENT / PLAN / RECOMMENDATIONS:   1) Type 2 Diabetes Mellitus, Optimally  controlled, With Neuropathic  complications - Most recent A1c of 6.6 %. Goal A1c < 8.0 %.    -Intolerant to Rybelsus due to nausea -Patient has been noted with BG readings in the 200s and 300s, I have recommended increasing Lantus since she is not on Rybelsus anymore -Not a candidate for SGLT2 inhibitors due to suprapubic catheter    MEDICATIONS: Continue metformin 500 mg , 2 tabs daily Increase Lantus 10 units daily   EDUCATION / INSTRUCTIONS: BG monitoring instructions: Patient is instructed to check her blood sugars 1 times a day, fasting. Call [facility] if: BG persistently < 70  I reviewed the Rule of 15 for the treatment of hypoglycemia in detail with the patient. Literature supplied.   2) Diabetic complications:  Eye: Does not have known diabetic retinopathy.  Neuro/ Feet: Does  have known diabetic peripheral neuropathy .  Renal: Patient does not have known baseline CKD. She   is  on an ACEI/ARB at present.    3) Hypothyroidism:  -Patient is clinically euthyroid -TFTs have been within normal range on current dose of levothyroxine -Will recheck on next visit   Medication  Levothyroxine 50 mcg daily     F/U in 6 months   Signed electronically by: Lamount Cranker  Lonzo Cloud, MD  [facility]  [facility] [address] [place], [place] [address] Phone: [phone] FAX: [phone]   CC: Levin Erp, MD [address] [place] [address] Phone: [phone]  Fax: [phone]  Return to Endocrinology clinic as below: Future Appointments  Date Time Provider Department  Center  12/26/2022 12:10 PM Jeorge Reister, Konrad Dolores, MD [facility] None  01/17/2023  2:15 PM Helane Gunther, DPM [facility]  02/13/2023  1:30 PM Levin Erp, MD [facility]  03/14/2024 12:30 PM [place], Edmonia James, NP [facility] None

## 2023-01-04 DIAGNOSIS — E113313 Type 2 diabetes mellitus with moderate nonproliferative diabetic retinopathy with macular edema, bilateral: Secondary | ICD-10-CM | POA: Diagnosis not present

## 2023-01-09 ENCOUNTER — Other Ambulatory Visit: Payer: Self-pay | Admitting: Student

## 2023-01-10 DIAGNOSIS — R339 Retention of urine, unspecified: Secondary | ICD-10-CM | POA: Diagnosis not present

## 2023-01-17 ENCOUNTER — Ambulatory Visit: Payer: 59 | Admitting: Podiatry

## 2023-01-23 ENCOUNTER — Other Ambulatory Visit: Payer: Self-pay

## 2023-01-23 DIAGNOSIS — E1140 Type 2 diabetes mellitus with diabetic neuropathy, unspecified: Secondary | ICD-10-CM

## 2023-01-23 MED ORDER — ACCU-CHEK SOFTCLIX LANCETS MISC: 1.0000 | Status: DC

## 2023-01-26 ENCOUNTER — Other Ambulatory Visit: Payer: Self-pay

## 2023-01-26 DIAGNOSIS — E1140 Type 2 diabetes mellitus with diabetic neuropathy, unspecified: Secondary | ICD-10-CM

## 2023-01-26 MED ORDER — ACCU-CHEK SOFTCLIX LANCETS MISC
1.0000 | 0 refills | Status: DC
Start: 2023-01-26 — End: 2023-02-06

## 2023-01-30 ENCOUNTER — Telehealth: Payer: Self-pay | Admitting: Student

## 2023-01-30 NOTE — Telephone Encounter (Signed)
Patient's daughter dropped off FMLA paperwork to be completed. Last DOS was 11/15/22. Placed in [employer].

## 2023-01-30 NOTE — Telephone Encounter (Signed)
Left some forms in your box that the patient needs completed. Thank you  

## 2023-01-31 NOTE — Telephone Encounter (Signed)
Forms faxed to [employer].   A copy placed up front for pick up.   Copy made for batch scanning.

## 2023-02-02 ENCOUNTER — Other Ambulatory Visit: Payer: Self-pay | Admitting: Student

## 2023-02-02 DIAGNOSIS — R350 Frequency of micturition: Secondary | ICD-10-CM

## 2023-02-06 ENCOUNTER — Other Ambulatory Visit: Payer: Self-pay | Admitting: Family Medicine

## 2023-02-06 NOTE — Progress Notes (Signed)
Received CAP/DA form Copy scanned to chart Updated med list

## 2023-02-07 DIAGNOSIS — R339 Retention of urine, unspecified: Secondary | ICD-10-CM | POA: Diagnosis not present

## 2023-02-10 ENCOUNTER — Other Ambulatory Visit: Payer: Self-pay | Admitting: Internal Medicine

## 2023-02-10 DIAGNOSIS — Z794 Long term (current) use of insulin: Secondary | ICD-10-CM

## 2023-02-13 ENCOUNTER — Ambulatory Visit (INDEPENDENT_AMBULATORY_CARE_PROVIDER_SITE_OTHER): Payer: 59 | Admitting: Student

## 2023-02-13 ENCOUNTER — Encounter: Payer: Self-pay | Admitting: Student

## 2023-02-13 VITALS — BP 118/72 | HR 99 | Ht 62.0 in | Wt 134.4 lb

## 2023-02-13 DIAGNOSIS — E1159 Type 2 diabetes mellitus with other circulatory complications: Secondary | ICD-10-CM | POA: Diagnosis not present

## 2023-02-13 DIAGNOSIS — Z9359 Other cystostomy status: Secondary | ICD-10-CM

## 2023-02-13 DIAGNOSIS — E1140 Type 2 diabetes mellitus with diabetic neuropathy, unspecified: Secondary | ICD-10-CM

## 2023-02-13 DIAGNOSIS — Z794 Long term (current) use of insulin: Secondary | ICD-10-CM | POA: Diagnosis not present

## 2023-02-13 DIAGNOSIS — I152 Hypertension secondary to endocrine disorders: Secondary | ICD-10-CM | POA: Diagnosis not present

## 2023-02-13 DIAGNOSIS — R051 Acute cough: Secondary | ICD-10-CM

## 2023-02-13 NOTE — Assessment & Plan Note (Signed)
175/99 initially and on repeat was 118/72.  Doing well off of medications.

## 2023-02-13 NOTE — Progress Notes (Signed)
    SUBJECTIVE:   CHIEF COMPLAINT / HPI: Follow-up  Insomnia-doxepin was helping her sleep however later hallucinate/have stronger dreams at nighttime which she did not enjoy.  Has not taken it in a while.   HTN associated with diabetes-BP goal 150-160/90 for age-controlled off of any antihypertensive medications.  Previously on enalapril but was discontinued due to hyperkalemia.  Type 2 Diabetes and Hypothyroidism-followed by endocrinology was recently increased to 10 units Lantus,  metformin 1000 daily, 50 mcg Synthroid No low blood sugars  Suprapubic catheter for chronic cystitis followed by urology  Symptoms for the last 2 nights has been having some intermittent coughing.  Denies any shortness of breath, chest pain, fevers, productive cough.  Denies any hemoptysis.  Denies any history of asthma.  No sick contacts around her. No aspiration event prior to this.  Recently got hearing aids and doing well in terms of her hearing.  PERTINENT  PMH / PSH: Reviewed  OBJECTIVE:   BP 118/72   Pulse 99   Ht 5\' 2"  (1.575 m)   Wt 134 lb 6.4 oz (61 kg)   SpO2 99%   BMI 24.58 kg/m   General: NAD, awake, alert, responsive to questions, in wheelchair Head: Normocephalic atraumatic CV: Regular rate and rhythm no murmurs rubs or gallops Respiratory: Clear to ausculation bilaterally, no wheezes rales or crackles, chest rises symmetrically,  no increased work of breathing on RA Extremities: Mild edema in BLE  ASSESSMENT/PLAN:   Hypertension associated with diabetes (HCC) 175/99 initially and on repeat was 118/72.  Doing well off of medications.  Type 2 diabetes mellitus with diabetic neuropathy (HCC) Followed by endocrinology.  Recently increased her insulin and she denies any low blood sugars currently.  Chronic suprapubic catheter (HCC) Recently got this changed and is doing well without any complaints.   Cough For the past 2 nights has been having intermittent coughing while laying  down flat.  Denies any shortness of breath or heart failure symptoms.  Denies any infectious symptoms of fevers/vomiting.  Less likely to be serious etiologies such as pulmonary embolism given normal pulse and no pleuritic chest pain and no hematemesis.  Possibly related to postnasal drip, advised Flonase as needed prior to going to sleep.  Discussed if continuing to have cough to please schedule appointment for follow-up and we could consider chest imaging at that time. -ED/precautions discussed -Flonase as needed  Levin Erp, MD [facility]

## 2023-02-13 NOTE — Assessment & Plan Note (Signed)
Recently got this changed and is doing well without any complaints.

## 2023-02-13 NOTE — Patient Instructions (Signed)
It was great to see you! Thank you for allowing me to participate in your care!   I recommend that you always bring your medications to each appointment as this makes it easy to ensure we are on the correct medications and helps [place] not miss when refills are needed.  Our plans for today:  - BP looks great - Follow up as needed - return to care if cough not improving, fevers, confused - Go down on insulin if any lows  We are checking some labs today, I will call you if they are abnormal will send you a MyChart message or a letter if they are normal.  If you do not hear about your labs in the next 2 weeks please let us know.  Take care and seek immediate care sooner if you develop any concerns. Please remember to show up 15 minutes before your scheduled appointment time!  Levin Erp, MD [facility]

## 2023-02-13 NOTE — Assessment & Plan Note (Signed)
Followed by endocrinology.  Recently increased her insulin and she denies any low blood sugars currently.

## 2023-03-06 ENCOUNTER — Encounter: Payer: Self-pay | Admitting: Podiatry

## 2023-03-06 ENCOUNTER — Ambulatory Visit: Payer: 59 | Admitting: Podiatry

## 2023-03-06 DIAGNOSIS — B351 Tinea unguium: Secondary | ICD-10-CM | POA: Diagnosis not present

## 2023-03-06 DIAGNOSIS — M79674 Pain in right toe(s): Secondary | ICD-10-CM | POA: Diagnosis not present

## 2023-03-06 DIAGNOSIS — M79675 Pain in left toe(s): Secondary | ICD-10-CM | POA: Diagnosis not present

## 2023-03-06 DIAGNOSIS — E1140 Type 2 diabetes mellitus with diabetic neuropathy, unspecified: Secondary | ICD-10-CM | POA: Diagnosis not present

## 2023-03-06 DIAGNOSIS — Z794 Long term (current) use of insulin: Secondary | ICD-10-CM | POA: Diagnosis not present

## 2023-03-06 NOTE — Progress Notes (Signed)
This patient returns to my office for at risk foot care.  This patient requires this care by a professional since this patient will be at risk due to having diabetes.  This patient is unable to cut nails herself since the patient cannot reach her nails.These nails are painful walking and wearing shoes.  She presents to the office with her son  in a wheelchair.   This patient presents for at risk foot care today.  General Appearance  Alert, conversant and in no acute stress.  Vascular  Dorsalis pedis and posterior tibial  pulses are  weakly palpable  bilaterally.  Capillary return is within normal limits  bilaterally. Temperature is within normal limits  bilaterally.  Neurologic  Senn-Weinstein monofilament wire test within normal limits  bilaterally. Muscle power within normal limits bilaterally.  Nails Thick disfigured discolored nails with subungual debris  from hallux to fifth toes bilaterally. No evidence of bacterial infection or drainage bilaterally.  Orthopedic  No limitations of motion  feet .  No crepitus or effusions noted.  No bony pathology or digital deformities noted.  Skin  normotropic skin with no porokeratosis noted bilaterally.  No signs of infections or ulcers noted.     Onychomycosis  Pain in right toes  Pain in left toes  Consent was obtained for treatment procedures.   Mechanical debridement of nails 1-5  bilaterally performed with a nail nipper.  Filed with dremel without incident.    Return office visit   4  months                   Told patient to return for periodic foot care and evaluation due to potential at risk complications.   Helane Gunther DPM

## 2023-03-09 DIAGNOSIS — R3914 Feeling of incomplete bladder emptying: Secondary | ICD-10-CM | POA: Diagnosis not present

## 2023-03-09 DIAGNOSIS — N3020 Other chronic cystitis without hematuria: Secondary | ICD-10-CM | POA: Diagnosis not present

## 2023-03-13 ENCOUNTER — Other Ambulatory Visit: Payer: Self-pay | Admitting: Student

## 2023-03-14 ENCOUNTER — Encounter (HOSPITAL_COMMUNITY): Payer: Self-pay

## 2023-03-14 ENCOUNTER — Emergency Department (HOSPITAL_COMMUNITY): Payer: 59

## 2023-03-14 ENCOUNTER — Other Ambulatory Visit: Payer: Self-pay

## 2023-03-14 ENCOUNTER — Emergency Department (HOSPITAL_COMMUNITY)
Admission: EM | Admit: 2023-03-14 | Discharge: 2023-03-14 | Disposition: A | Discharge status: Home / Self Care | Payer: 59 | Source: Home / Self Care | Attending: Emergency Medicine | Admitting: Emergency Medicine

## 2023-03-14 DIAGNOSIS — U071 COVID-19: Secondary | ICD-10-CM | POA: Diagnosis not present

## 2023-03-14 DIAGNOSIS — N189 Chronic kidney disease, unspecified: Secondary | ICD-10-CM | POA: Insufficient documentation

## 2023-03-14 DIAGNOSIS — Z86718 Personal history of other venous thrombosis and embolism: Secondary | ICD-10-CM | POA: Diagnosis not present

## 2023-03-14 DIAGNOSIS — L89151 Pressure ulcer of sacral region, stage 1: Secondary | ICD-10-CM | POA: Insufficient documentation

## 2023-03-14 DIAGNOSIS — E1165 Type 2 diabetes mellitus with hyperglycemia: Secondary | ICD-10-CM | POA: Insufficient documentation

## 2023-03-14 DIAGNOSIS — N3001 Acute cystitis with hematuria: Secondary | ICD-10-CM | POA: Diagnosis not present

## 2023-03-14 DIAGNOSIS — E039 Hypothyroidism, unspecified: Secondary | ICD-10-CM | POA: Insufficient documentation

## 2023-03-14 DIAGNOSIS — R918 Other nonspecific abnormal finding of lung field: Secondary | ICD-10-CM | POA: Diagnosis not present

## 2023-03-14 DIAGNOSIS — J1282 Pneumonia due to coronavirus disease 2019: Secondary | ICD-10-CM | POA: Diagnosis not present

## 2023-03-14 DIAGNOSIS — D631 Anemia in chronic kidney disease: Secondary | ICD-10-CM | POA: Diagnosis not present

## 2023-03-14 DIAGNOSIS — Z743 Need for continuous supervision: Secondary | ICD-10-CM | POA: Diagnosis not present

## 2023-03-14 DIAGNOSIS — J9601 Acute respiratory failure with hypoxia: Secondary | ICD-10-CM | POA: Diagnosis not present

## 2023-03-14 DIAGNOSIS — Z87891 Personal history of nicotine dependence: Secondary | ICD-10-CM | POA: Diagnosis not present

## 2023-03-14 DIAGNOSIS — Z7984 Long term (current) use of oral hypoglycemic drugs: Secondary | ICD-10-CM | POA: Insufficient documentation

## 2023-03-14 DIAGNOSIS — R9431 Abnormal electrocardiogram [ECG] [EKG]: Secondary | ICD-10-CM | POA: Diagnosis not present

## 2023-03-14 DIAGNOSIS — E1122 Type 2 diabetes mellitus with diabetic chronic kidney disease: Secondary | ICD-10-CM | POA: Insufficient documentation

## 2023-03-14 DIAGNOSIS — F03A0 Unspecified dementia, mild, without behavioral disturbance, psychotic disturbance, mood disturbance, and anxiety: Secondary | ICD-10-CM | POA: Diagnosis not present

## 2023-03-14 DIAGNOSIS — I700 Atherosclerosis of aorta: Secondary | ICD-10-CM | POA: Diagnosis not present

## 2023-03-14 DIAGNOSIS — A419 Sepsis, unspecified organism: Secondary | ICD-10-CM | POA: Diagnosis not present

## 2023-03-14 DIAGNOSIS — I129 Hypertensive chronic kidney disease with stage 1 through stage 4 chronic kidney disease, or unspecified chronic kidney disease: Secondary | ICD-10-CM | POA: Insufficient documentation

## 2023-03-14 DIAGNOSIS — Z794 Long term (current) use of insulin: Secondary | ICD-10-CM | POA: Insufficient documentation

## 2023-03-14 DIAGNOSIS — I517 Cardiomegaly: Secondary | ICD-10-CM | POA: Diagnosis not present

## 2023-03-14 DIAGNOSIS — Z993 Dependence on wheelchair: Secondary | ICD-10-CM | POA: Diagnosis not present

## 2023-03-14 DIAGNOSIS — R059 Cough, unspecified: Secondary | ICD-10-CM | POA: Diagnosis not present

## 2023-03-14 DIAGNOSIS — R61 Generalized hyperhidrosis: Secondary | ICD-10-CM | POA: Diagnosis not present

## 2023-03-14 DIAGNOSIS — E1140 Type 2 diabetes mellitus with diabetic neuropathy, unspecified: Secondary | ICD-10-CM | POA: Diagnosis not present

## 2023-03-14 DIAGNOSIS — G9341 Metabolic encephalopathy: Secondary | ICD-10-CM | POA: Diagnosis not present

## 2023-03-14 DIAGNOSIS — Z833 Family history of diabetes mellitus: Secondary | ICD-10-CM | POA: Diagnosis not present

## 2023-03-14 DIAGNOSIS — K219 Gastro-esophageal reflux disease without esophagitis: Secondary | ICD-10-CM | POA: Diagnosis not present

## 2023-03-14 DIAGNOSIS — Z7982 Long term (current) use of aspirin: Secondary | ICD-10-CM | POA: Insufficient documentation

## 2023-03-14 DIAGNOSIS — R404 Transient alteration of awareness: Secondary | ICD-10-CM | POA: Diagnosis not present

## 2023-03-14 DIAGNOSIS — E876 Hypokalemia: Secondary | ICD-10-CM | POA: Diagnosis not present

## 2023-03-14 DIAGNOSIS — R55 Syncope and collapse: Secondary | ICD-10-CM | POA: Insufficient documentation

## 2023-03-14 DIAGNOSIS — R0902 Hypoxemia: Secondary | ICD-10-CM | POA: Diagnosis not present

## 2023-03-14 DIAGNOSIS — E785 Hyperlipidemia, unspecified: Secondary | ICD-10-CM | POA: Diagnosis not present

## 2023-03-14 DIAGNOSIS — R0989 Other specified symptoms and signs involving the circulatory and respiratory systems: Secondary | ICD-10-CM | POA: Diagnosis not present

## 2023-03-14 DIAGNOSIS — I771 Stricture of artery: Secondary | ICD-10-CM | POA: Diagnosis not present

## 2023-03-14 DIAGNOSIS — R739 Hyperglycemia, unspecified: Secondary | ICD-10-CM | POA: Diagnosis not present

## 2023-03-14 DIAGNOSIS — H409 Unspecified glaucoma: Secondary | ICD-10-CM | POA: Diagnosis not present

## 2023-03-14 LAB — COMPREHENSIVE METABOLIC PANEL
ALT: 10 U/L (ref 0–44)
AST: 17 U/L (ref 15–41)
Albumin: 3.6 g/dL (ref 3.5–5.0)
Alkaline Phosphatase: 58 U/L (ref 38–126)
Anion gap: 7 (ref 5–15)
BUN: 19 mg/dL (ref 8–23)
CO2: 26 mmol/L (ref 22–32)
Calcium: 8.4 mg/dL — ABNORMAL LOW (ref 8.9–10.3)
Chloride: 102 mmol/L (ref 98–111)
Creatinine, Ser: 0.66 mg/dL (ref 0.44–1.00)
GFR, Estimated: >60 mL/min (ref >60)
Glucose, Bld: 249 mg/dL — ABNORMAL HIGH (ref 70–99)
Potassium: 3.9 mmol/L (ref 3.5–5.1)
Sodium: 135 mmol/L (ref 135–145)
Total Bilirubin: 0.8 mg/dL (ref 0.3–1.2)
Total Protein: 6.4 g/dL — ABNORMAL LOW (ref 6.5–8.1)

## 2023-03-14 LAB — CBC WITH DIFFERENTIAL/PLATELET
Abs Immature Granulocytes: 0.03 10*3/uL (ref 0.00–0.07)
Basophils Absolute: 0 10*3/uL (ref 0.0–0.1)
Basophils Relative: 0 %
Eosinophils Absolute: 0 10*3/uL (ref 0.0–0.5)
Eosinophils Relative: 1 %
HCT: 31.1 % — ABNORMAL LOW (ref 36.0–46.0)
Hemoglobin: 9.7 g/dL — ABNORMAL LOW (ref 12.0–15.0)
Immature Granulocytes: 1 %
Lymphocytes Relative: 5 %
Lymphs Abs: 0.2 10*3/uL — ABNORMAL LOW (ref 0.7–4.0)
MCH: 30.2 pg (ref 26.0–34.0)
MCHC: 31.2 g/dL (ref 30.0–36.0)
MCV: 96.9 fL (ref 80.0–100.0)
Monocytes Absolute: 0.4 10*3/uL (ref 0.1–1.0)
Monocytes Relative: 8 %
Neutro Abs: 3.9 10*3/uL (ref 1.7–7.7)
Neutrophils Relative %: 85 %
Platelets: 171 10*3/uL (ref 150–400)
RBC: 3.21 MIL/uL — ABNORMAL LOW (ref 3.87–5.11)
RDW: 13.3 % (ref 11.5–15.5)
WBC: 4.5 10*3/uL (ref 4.0–10.5)
nRBC: 0 % (ref 0.0–0.2)

## 2023-03-14 LAB — URINALYSIS, ROUTINE W REFLEX MICROSCOPIC
Bilirubin Urine: NEGATIVE
Glucose, UA: 50 mg/dL — AB
Ketones, ur: NEGATIVE mg/dL
Nitrite: NEGATIVE
Protein, ur: 100 mg/dL — AB
Specific Gravity, Urine: 1.016 (ref 1.005–1.030)
WBC, UA: >50 WBC/hpf (ref 0–5)
pH: 6 (ref 5.0–8.0)

## 2023-03-14 LAB — TROPONIN I (HIGH SENSITIVITY): Troponin I (High Sensitivity): 4 ng/L (ref <18)

## 2023-03-14 MED ORDER — LACTATED RINGERS IV BOLUS
500.0000 mL | Freq: Once | INTRAVENOUS | Status: AC
  Administered 2023-03-14: 500 mL via INTRAVENOUS

## 2023-03-14 MED ORDER — SODIUM CHLORIDE 0.9 % IV SOLN
2.0000 g | Freq: Once | INTRAVENOUS | Status: AC
  Administered 2023-03-14: 2 g via INTRAVENOUS
  Filled 2023-03-14: qty 20

## 2023-03-14 MED ORDER — CEPHALEXIN 500 MG PO CAPS: 500.0000 mg | ORAL_CAPSULE | Freq: Four times a day (QID) | ORAL | 0 refills | Status: DC

## 2023-03-14 NOTE — Discharge Instructions (Signed)
Follow-up with your family doctor next week for recheck. 

## 2023-03-14 NOTE — ED Notes (Signed)
Pt aware urine sample need.

## 2023-03-14 NOTE — ED Notes (Signed)
Nurse changed superpubic bag

## 2023-03-14 NOTE — ED Triage Notes (Signed)
Patient brought in by EMS due to syncopal episode while at home with family. Family witnessed patient having episode while on the bedside commode. Daughter reports wound to sacrum, which patient is complaining about.

## 2023-03-14 NOTE — ED Notes (Signed)
Discharge papers reviewed with pt and daughter. Family to transfer pt home

## 2023-03-14 NOTE — ED Notes (Signed)
Family member to come transport pt home. Per family pt transfers with assistance into wheelchair

## 2023-03-14 NOTE — ED Provider Notes (Signed)
[place] EMERGENCY DEPARTMENT AT [facility] Provider Note   CSN: [id number] Arrival date & time: 03/14/23  1321     History  Chief Complaint  Patient presents with   Near Syncope    Hayley Fisher is a 89 y.o. female.   Near Syncope  89 year old female history of GERD, hypothyroidism, CKD, diabetes, glaucoma, hypertension, hyperlipidemia presenting for syncope.  Patient states she feels well.  She states she was at home where she lives with her daughter.  She states she had a bowel movement today after having some constipation.  Unsure if there was blood.  She does not member passing out or feeling dizzy or lightheaded.  She is currently asymptomatic and would like to go home.  She has no fevers or chills.  She has chronic wound to her sacrum which she does not know of any changes from.  No chest pain, shortness of breath.  She did not fall or hit her head.  No headache or neck pain.  No chest pain or belly pain or back pain.  Per daughter Annice Pih: Patient was at home with her family.  She recently has been constipated.  She had a large bowel movement today.  After this she started to feel clammy, sweaty and then was unconscious for few seconds.  No seizure-like activity.  She woke up after this and was okay.  They report recent cough but she is otherwise been asymptomatic.     Home Medications Prior to Admission medications   Medication Sig Start Date End Date Taking? Authorizing Provider  acetaminophen (TYLENOL) 500 MG tablet Take 1,000 mg by mouth every 6 (six) hours as needed for moderate pain.    [provider]  aspirin 81 MG chewable tablet Chew 81 mg by mouth daily.    [provider]  brimonidine (ALPHAGAN) 0.2 % ophthalmic solution Place 1 drop into the left eye 2 (two) times daily. 03/30/21   [provider]  dorzolamide-timolol (COSOPT) 22.3-6.8 MG/ML ophthalmic solution Place 1 drop into both eyes 2 (two) times daily. 05/18/20    [provider]  doxepin (SINEQUAN) 10 MG/ML solution Take 1 mL (10 mg total) by mouth at bedtime as needed for sleep. 05/05/22   McDiarmid, Leighton Roach, MD  famotidine (PEPCID) 20 MG tablet TAKE 1 TABLET BY MOUTH EVERY DAY 12/08/22   Levin Erp, MD  Ferrous Sulfate (IRON) 325 (65 Fe) MG TABS TAKE 1 TABLET BY MOUTH DAILY WITH BREAKFAST 09/13/22   Levin Erp, MD  glucose blood (ACCU-CHEK AVIVA PLUS) test strip USE TO TEST BLOOD SUGAR UP  TO 3 TIMES A DAY. 05/05/22   Shamleffer, Konrad Dolores, MD  insulin glargine (LANTUS SOLOSTAR) 100 UNIT/ML Solostar Pen Inject 10 Units into the skin daily. 02/10/23   Shamleffer, Konrad Dolores, MD  Insulin Pen Needle (GLOBAL EASE INJECT PEN NEEDLES) 32G X 4 MM MISC USE 1 SYRINGE DAILY TO INJECT VICTOZA 12/26/22   Shamleffer, Konrad Dolores, MD  Insulin Syringe-Needle U-100 (SURE COMFORT INSULIN SYRINGE) 31G X 5/16" 0.3 ML MISC Inject 1 Syringe into the skin daily. 02/25/21   Lurene Shadow, MD  Insulin Syringe-Needle U-100 (TRUEPLUS INSULIN SYRINGE) 31G X 5/16" 0.3 ML MISC Use as directed 02/24/22   Shamleffer, Konrad Dolores, MD  Latanoprostene Bunod (VYZULTA) 0.024 % SOLN Place 1 drop into both eyes at bedtime.    [provider]  levothyroxine (SYNTHROID) 50 MCG tablet Take 1 tablet (50 mcg total) by mouth daily. 12/26/22   Shamleffer, Konrad Dolores,  MD  metFORMIN (GLUCOPHAGE-XR) 500 MG 24 hr tablet Take 2 tablets (1,000 mg total) by mouth daily with breakfast. 12/26/22   Shamleffer, Konrad Dolores, MD  Misc. Devices (TRANSFER BOARD) MISC Use as needed for transfer from bed to chair or vice versa 02/25/21   Lurene Shadow, MD  MYRBETRIQ 25 MG TB24 tablet TAKE 1 TABLET BY MOUTH EVERY DAY 02/02/23   Levin Erp, MD  polyethylene glycol powder (GLYCOLAX/MIRALAX) 17 GM/SCOOP powder Take 17 g by mouth in the morning and at bedtime. Patient taking differently: Take 17 g by mouth 2 (two) times daily as needed for mild constipation. 08/20/21    Levin Erp, MD  SARNA lotion Apply 1 application topically 4 (four) times daily as needed for itching. 05/27/21   [provider]  senna (SENOKOT) 8.6 MG TABS tablet Take 1 tablet (8.6 mg total) by mouth 2 (two) times daily. Patient taking differently: Take 1 tablet by mouth daily as needed for mild constipation. 08/09/21   Alicia Amel, MD  tamsulosin (FLOMAX) 0.4 MG CAPS capsule TAKE 1 CAPSULE BY MOUTH EVERY DAY 03/13/23   Levin Erp, MD      Allergies    Tramadol and Levemir [insulin detemir]    Review of Systems   Review of Systems  Cardiovascular:  Positive for near-syncope.    Physical Exam Updated Vital Signs BP (!) 159/74   Pulse (!) 105   Temp 98.4 F (36.9 C) (Oral)   Resp 16   Ht 5\' 2"  (1.575 m)   Wt 62.1 kg   SpO2 100%   BMI 25.06 kg/m  Physical Exam Vitals and nursing note reviewed. Exam conducted with a chaperone present.  Constitutional:      General: She is not in acute distress.    Appearance: She is well-developed.  HENT:     Head: Normocephalic and atraumatic.     Nose: Nose normal. No congestion or rhinorrhea.     Mouth/Throat:     Mouth: Mucous membranes are moist.     Pharynx: Oropharynx is clear.  Eyes:     Extraocular Movements: Extraocular movements intact.     Conjunctiva/sclera: Conjunctivae normal.     Pupils: Pupils are equal, round, and reactive to light.  Cardiovascular:     Rate and Rhythm: Normal rate and regular rhythm.     Heart sounds: No murmur heard. Pulmonary:     Effort: Pulmonary effort is normal. No respiratory distress.     Breath sounds: Normal breath sounds.  Abdominal:     Palpations: Abdomen is soft.     Tenderness: There is no abdominal tenderness. There is no right CVA tenderness, left CVA tenderness, guarding or rebound.     Comments: Suprapubic catheter in place.  No erythema.  Genitourinary:    Rectum: Normal.     Comments: No stool ball in the rectum.  No melena or  hematochezia. Musculoskeletal:        General: No swelling.     Cervical back: Normal range of motion and neck supple. No rigidity or tenderness.     Right lower leg: No edema.     Left lower leg: No edema.     Comments: Small area of skin breakdown over sacrum, no induration, fluctuance, or drainage.  Skin:    General: Skin is warm and dry.     Capillary Refill: Capillary refill takes less than 2 seconds.  Neurological:     General: No focal deficit present.     Mental Status:  She is alert and oriented to person, place, and time. Mental status is at baseline.     Cranial Nerves: No cranial nerve deficit.     Sensory: No sensory deficit.     Motor: No weakness.     Coordination: Coordination normal.  Psychiatric:        Mood and Affect: Mood normal.     ED Results / Procedures / Treatments   Labs (all labs ordered are listed, but only abnormal results are displayed) Labs Reviewed  CBC WITH DIFFERENTIAL/PLATELET - Abnormal; Notable for the following components:      Result Value   RBC 3.21 (*)    Hemoglobin 9.7 (*)    HCT 31.1 (*)    Lymphs Abs 0.2 (*)    All other components within normal limits  COMPREHENSIVE METABOLIC PANEL - Abnormal; Notable for the following components:   Glucose, Bld 249 (*)    Calcium 8.4 (*)    Total Protein 6.4 (*)    All other components within normal limits  URINE CULTURE  URINALYSIS, ROUTINE W REFLEX MICROSCOPIC  TROPONIN I (HIGH SENSITIVITY)  TROPONIN I (HIGH SENSITIVITY)    EKG EKG Interpretation Date/Time:  Tuesday March 14 2023 15:06:01 EDT Ventricular Rate:  106 PR Interval:  190 QRS Duration:  80 QT Interval:  347 QTC Calculation: 461 R Axis:   23  Text Interpretation: Sinus tachycardia Anterior infarct, old Confirmed by Fulton Reek [phone]) on 03/14/2023 3:46:02 PM  Radiology DG Chest 2 View  Result Date: 03/14/2023 CLINICAL DATA:  Syncope. EXAM: CHEST - 2 VIEW COMPARISON:  08/02/2021 FINDINGS: Heart size and mediastinal  contours are unremarkable. Lung volumes are low. No signs of pleural effusion or edema. Bandlike areas of scarring versus atelectasis noted within both lung bases. No airspace consolidation. Visualized osseous structures appear intact. IMPRESSION: Low lung volumes with bibasilar scarring versus atelectasis. Electronically Signed   By: Signa Kell M.D.   On: 03/14/2023 15:59    Procedures Procedures    Medications Ordered in ED Medications  lactated ringers bolus 500 mL (0 mLs Intravenous Stopped 03/14/23 1635)    ED Course/ Medical Decision Making/ A&P                             Medical Decision Making Amount and/or Complexity of Data Reviewed Labs: ordered. Radiology: ordered.   Medical Decision Making:   Demi Trieu is a 89 y.o. female who presented to the ED today with episode of syncope.  Vital signs reviewed notable for mild tachycardia.  On exam she is currently asymptomatic although does not member the event.  I spoke with her daughter who witnessed it.  Patient had large bowel movement after constipation which proceeded the episode, could be vasovagal syncope.  She was quite diaphoretic, no frank chest pain EKG is nonischemic will obtain delta troponin for evaluation of possible ACS.  She is not febrile, no clear signs of infection.  She is normal neurologic exam, low concern for stroke.  She did feel somewhat dizzy earlier, which is somewhat reassuring against arrhythmia genic cause and no signs arrhythmia here.  I did do a rectal exam, no signs of retained stool ball or bleeding.  Abdominal exam is benign.  No murmur or evidence of valvular issue.   Patient placed on continuous vitals and telemetry monitoring while in ED which was reviewed periodically.  Reviewed and confirmed nursing documentation for past medical history, family history, social history.  Initial Study Results:   Laboratory  All laboratory results reviewed.  Labs notable for mild hyperglycemia without  evidence of DKA.  Mild.  EKG EKG was reviewed independently. Rate, rhythm, axis, intervals all examined and without medically relevant abnormality. ST segments without concerns for elevations.    Radiology:  All images reviewed independently.  Chest x-ray unremarkable.  Agree with radiology report at this time.  Reassessment and Plan:   On reassessment she remained stable.  She is still asymptomatic and mildly tachycardic.  Troponin, UA are pending.  Discussed with family at bedside.  Handoff given to Dr. Estell Harpin at [phone] with plan to follow up workup and reassess.  Patient's presentation is most consistent with acute presentation with potential threat to life or bodily function.           Final Clinical Impression(s) / ED Diagnoses Final diagnoses:  Syncope, unspecified syncope type    Rx / DC Orders ED Discharge Orders     None         Laurence Spates, MD 03/14/23 1730

## 2023-03-15 ENCOUNTER — Telehealth: Payer: Self-pay | Admitting: Student

## 2023-03-15 LAB — URINE CULTURE

## 2023-03-15 NOTE — Telephone Encounter (Signed)
**  After Hours/ Emergency Line Call**  Received a call to report that Hayley Fisher sugars have been running in the 300s.  Was seen in the emergency department yesterday for syncope after large bowel movement.  States that she was prescribed Keflex for possible UTI.  For the past 5 days her blood sugars have been in the 300s. No vomiting. A little confusion per daughter that is improving. Urine clearing up now. Not super thirsty. Some mild abdominal pain.  Labs yesterday showed hyperglycemia without signs of DKA.  Red flags discussed for DKA.  Sugars today have been [phone].  Discussed that she can take an additional 2 units of long-acting tonight if still elevated above 300.  If her fasting is still above 300 titrate up by 1 unit a day until she is back in normal range.  I discussed this is most likely stress reaction from being sick but did discuss strict return precautions for DKA and pneumonia/abdominal infection.  The whole family is sick with a virus and she has a very bad cough.  Chest imaging that was obtained yesterday reassuring against a pneumonia.  She is not short of breath currently.  I discussed that she can should be seen in our clinic and she has a appointment on Tuesday of next week.  I stated if needing to be seen earlier to please call our clinic back for same-day appointment.  Daughter and patient aware.  All questions answered.  Discussed that they can call back the emergency line if any other questions, up throughout the night.  Will forward to provider who is seeing her on Tuesday.  Levin Erp, MD PGY-3, [facility] 03/15/2023 7:19 PM

## 2023-03-16 ENCOUNTER — Other Ambulatory Visit: Payer: Self-pay

## 2023-03-16 ENCOUNTER — Emergency Department (HOSPITAL_COMMUNITY): Payer: 59

## 2023-03-16 ENCOUNTER — Other Ambulatory Visit: Payer: Self-pay | Admitting: Internal Medicine

## 2023-03-16 ENCOUNTER — Inpatient Hospital Stay (HOSPITAL_COMMUNITY)
Admission: EM | Admit: 2023-03-16 | Discharge: 2023-03-19 | DRG: 871 | Disposition: A | Discharge status: Home Health | Payer: 59 | Attending: Internal Medicine | Admitting: Internal Medicine

## 2023-03-16 DIAGNOSIS — G9341 Metabolic encephalopathy: Secondary | ICD-10-CM | POA: Diagnosis present

## 2023-03-16 DIAGNOSIS — Z743 Need for continuous supervision: Secondary | ICD-10-CM | POA: Diagnosis not present

## 2023-03-16 DIAGNOSIS — U071 COVID-19: Secondary | ICD-10-CM | POA: Diagnosis present

## 2023-03-16 DIAGNOSIS — R9431 Abnormal electrocardiogram [ECG] [EKG]: Secondary | ICD-10-CM | POA: Diagnosis not present

## 2023-03-16 DIAGNOSIS — E039 Hypothyroidism, unspecified: Secondary | ICD-10-CM | POA: Diagnosis present

## 2023-03-16 DIAGNOSIS — E785 Hyperlipidemia, unspecified: Secondary | ICD-10-CM | POA: Diagnosis present

## 2023-03-16 DIAGNOSIS — Z833 Family history of diabetes mellitus: Secondary | ICD-10-CM | POA: Diagnosis not present

## 2023-03-16 DIAGNOSIS — I129 Hypertensive chronic kidney disease with stage 1 through stage 4 chronic kidney disease, or unspecified chronic kidney disease: Secondary | ICD-10-CM | POA: Diagnosis present

## 2023-03-16 DIAGNOSIS — Z7982 Long term (current) use of aspirin: Secondary | ICD-10-CM

## 2023-03-16 DIAGNOSIS — N390 Urinary tract infection, site not specified: Secondary | ICD-10-CM | POA: Diagnosis present

## 2023-03-16 DIAGNOSIS — J1282 Pneumonia due to coronavirus disease 2019: Secondary | ICD-10-CM | POA: Diagnosis present

## 2023-03-16 DIAGNOSIS — F03A0 Unspecified dementia, mild, without behavioral disturbance, psychotic disturbance, mood disturbance, and anxiety: Secondary | ICD-10-CM | POA: Diagnosis present

## 2023-03-16 DIAGNOSIS — A419 Sepsis, unspecified organism: Secondary | ICD-10-CM | POA: Diagnosis not present

## 2023-03-16 DIAGNOSIS — E876 Hypokalemia: Secondary | ICD-10-CM | POA: Diagnosis present

## 2023-03-16 DIAGNOSIS — E1169 Type 2 diabetes mellitus with other specified complication: Secondary | ICD-10-CM | POA: Diagnosis present

## 2023-03-16 DIAGNOSIS — Z888 Allergy status to other drugs, medicaments and biological substances status: Secondary | ICD-10-CM

## 2023-03-16 DIAGNOSIS — E1140 Type 2 diabetes mellitus with diabetic neuropathy, unspecified: Secondary | ICD-10-CM | POA: Diagnosis present

## 2023-03-16 DIAGNOSIS — K219 Gastro-esophageal reflux disease without esophagitis: Secondary | ICD-10-CM | POA: Diagnosis present

## 2023-03-16 DIAGNOSIS — E1159 Type 2 diabetes mellitus with other circulatory complications: Secondary | ICD-10-CM | POA: Diagnosis present

## 2023-03-16 DIAGNOSIS — Z86718 Personal history of other venous thrombosis and embolism: Secondary | ICD-10-CM

## 2023-03-16 DIAGNOSIS — J9601 Acute respiratory failure with hypoxia: Secondary | ICD-10-CM | POA: Diagnosis present

## 2023-03-16 DIAGNOSIS — H409 Unspecified glaucoma: Secondary | ICD-10-CM | POA: Diagnosis present

## 2023-03-16 DIAGNOSIS — I700 Atherosclerosis of aorta: Secondary | ICD-10-CM | POA: Diagnosis not present

## 2023-03-16 DIAGNOSIS — Z7984 Long term (current) use of oral hypoglycemic drugs: Secondary | ICD-10-CM | POA: Diagnosis not present

## 2023-03-16 DIAGNOSIS — N182 Chronic kidney disease, stage 2 (mild): Secondary | ICD-10-CM | POA: Diagnosis present

## 2023-03-16 DIAGNOSIS — E1122 Type 2 diabetes mellitus with diabetic chronic kidney disease: Secondary | ICD-10-CM | POA: Diagnosis present

## 2023-03-16 DIAGNOSIS — Z993 Dependence on wheelchair: Secondary | ICD-10-CM

## 2023-03-16 DIAGNOSIS — Z87891 Personal history of nicotine dependence: Secondary | ICD-10-CM

## 2023-03-16 DIAGNOSIS — R6889 Other general symptoms and signs: Secondary | ICD-10-CM | POA: Diagnosis not present

## 2023-03-16 DIAGNOSIS — R32 Unspecified urinary incontinence: Secondary | ICD-10-CM | POA: Diagnosis present

## 2023-03-16 DIAGNOSIS — R918 Other nonspecific abnormal finding of lung field: Secondary | ICD-10-CM | POA: Diagnosis not present

## 2023-03-16 DIAGNOSIS — R0902 Hypoxemia: Secondary | ICD-10-CM

## 2023-03-16 DIAGNOSIS — T17908A Unspecified foreign body in respiratory tract, part unspecified causing other injury, initial encounter: Secondary | ICD-10-CM | POA: Diagnosis present

## 2023-03-16 DIAGNOSIS — F03C18 Unspecified dementia, severe, with other behavioral disturbance: Secondary | ICD-10-CM | POA: Diagnosis present

## 2023-03-16 DIAGNOSIS — R404 Transient alteration of awareness: Secondary | ICD-10-CM | POA: Diagnosis not present

## 2023-03-16 DIAGNOSIS — D649 Anemia, unspecified: Secondary | ICD-10-CM | POA: Diagnosis present

## 2023-03-16 DIAGNOSIS — D631 Anemia in chronic kidney disease: Secondary | ICD-10-CM | POA: Diagnosis present

## 2023-03-16 DIAGNOSIS — I517 Cardiomegaly: Secondary | ICD-10-CM | POA: Diagnosis not present

## 2023-03-16 DIAGNOSIS — Z79890 Hormone replacement therapy (postmenopausal): Secondary | ICD-10-CM

## 2023-03-16 DIAGNOSIS — I499 Cardiac arrhythmia, unspecified: Secondary | ICD-10-CM | POA: Diagnosis not present

## 2023-03-16 DIAGNOSIS — K5900 Constipation, unspecified: Secondary | ICD-10-CM | POA: Diagnosis present

## 2023-03-16 DIAGNOSIS — R079 Chest pain, unspecified: Secondary | ICD-10-CM | POA: Diagnosis not present

## 2023-03-16 DIAGNOSIS — I771 Stricture of artery: Secondary | ICD-10-CM | POA: Diagnosis not present

## 2023-03-16 DIAGNOSIS — Z800 Family history of malignant neoplasm of digestive organs: Secondary | ICD-10-CM

## 2023-03-16 DIAGNOSIS — Z79899 Other long term (current) drug therapy: Secondary | ICD-10-CM

## 2023-03-16 DIAGNOSIS — Z794 Long term (current) use of insulin: Secondary | ICD-10-CM

## 2023-03-16 DIAGNOSIS — F0390 Unspecified dementia without behavioral disturbance: Secondary | ICD-10-CM | POA: Diagnosis present

## 2023-03-16 LAB — RESP PANEL BY RT-PCR (RSV, FLU A&B, COVID)  RVPGX2
Influenza A by PCR: NEGATIVE
Influenza B by PCR: NEGATIVE
Resp Syncytial Virus by PCR: NEGATIVE
SARS Coronavirus 2 by RT PCR: POSITIVE — AB

## 2023-03-16 LAB — CBC WITH DIFFERENTIAL/PLATELET
Abs Immature Granulocytes: 0.02 10*3/uL (ref 0.00–0.07)
Basophils Absolute: 0 10*3/uL (ref 0.0–0.1)
Basophils Relative: 0 %
Eosinophils Absolute: 0 10*3/uL (ref 0.0–0.5)
Eosinophils Relative: 0 %
HCT: 34.8 % — ABNORMAL LOW (ref 36.0–46.0)
Hemoglobin: 11 g/dL — ABNORMAL LOW (ref 12.0–15.0)
Immature Granulocytes: 0 %
Lymphocytes Relative: 21 %
Lymphs Abs: 1 10*3/uL (ref 0.7–4.0)
MCH: 30.3 pg (ref 26.0–34.0)
MCHC: 31.6 g/dL (ref 30.0–36.0)
MCV: 95.9 fL (ref 80.0–100.0)
Monocytes Absolute: 0.4 10*3/uL (ref 0.1–1.0)
Monocytes Relative: 8 %
Neutro Abs: 3.3 10*3/uL (ref 1.7–7.7)
Neutrophils Relative %: 71 %
Platelets: 167 10*3/uL (ref 150–400)
RBC: 3.63 MIL/uL — ABNORMAL LOW (ref 3.87–5.11)
RDW: 13.5 % (ref 11.5–15.5)
WBC: 4.7 10*3/uL (ref 4.0–10.5)
nRBC: 0 % (ref 0.0–0.2)

## 2023-03-16 LAB — COMPREHENSIVE METABOLIC PANEL
ALT: 12 U/L (ref 0–44)
AST: 19 U/L (ref 15–41)
Albumin: 3.9 g/dL (ref 3.5–5.0)
Alkaline Phosphatase: 61 U/L (ref 38–126)
Anion gap: 13 (ref 5–15)
BUN: 12 mg/dL (ref 8–23)
CO2: 26 mmol/L (ref 22–32)
Calcium: 8.7 mg/dL — ABNORMAL LOW (ref 8.9–10.3)
Chloride: 97 mmol/L — ABNORMAL LOW (ref 98–111)
Creatinine, Ser: 0.75 mg/dL (ref 0.44–1.00)
GFR, Estimated: >60 mL/min (ref >60)
Glucose, Bld: 228 mg/dL — ABNORMAL HIGH (ref 70–99)
Potassium: 3.5 mmol/L (ref 3.5–5.1)
Sodium: 136 mmol/L (ref 135–145)
Total Bilirubin: 0.9 mg/dL (ref 0.3–1.2)
Total Protein: 7.4 g/dL (ref 6.5–8.1)

## 2023-03-16 LAB — PROTIME-INR
INR: 1.1 (ref 0.8–1.2)
Prothrombin Time: 14.2 seconds (ref 11.4–15.2)

## 2023-03-16 LAB — GLUCOSE, CAPILLARY
Glucose-Capillary: 127 mg/dL — ABNORMAL HIGH (ref 70–99)
Glucose-Capillary: 180 mg/dL — ABNORMAL HIGH (ref 70–99)

## 2023-03-16 LAB — PHOSPHORUS: Phosphorus: 2.9 mg/dL (ref 2.5–4.6)

## 2023-03-16 LAB — HEMOGLOBIN A1C
Hgb A1c MFr Bld: 8.5 % — ABNORMAL HIGH (ref 4.8–5.6)
Mean Plasma Glucose: 197.25 mg/dL

## 2023-03-16 LAB — CBG MONITORING, ED
Glucose-Capillary: 197 mg/dL — ABNORMAL HIGH (ref 70–99)
Glucose-Capillary: 161 mg/dL — ABNORMAL HIGH (ref 70–99)

## 2023-03-16 LAB — APTT: aPTT: 31 seconds (ref 24–36)

## 2023-03-16 LAB — I-STAT CG4 LACTIC ACID, ED: Lactic Acid, Venous: 0.8 mmol/L (ref 0.5–1.9)

## 2023-03-16 LAB — MAGNESIUM: Magnesium: 1.4 mg/dL — ABNORMAL LOW (ref 1.7–2.4)

## 2023-03-16 MED ORDER — MAGNESIUM SULFATE 2 GM/50ML IV SOLN
2.0000 g | Freq: Once | INTRAVENOUS | Status: AC
  Administered 2023-03-16: 2 g via INTRAVENOUS
  Filled 2023-03-16: qty 50

## 2023-03-16 MED ORDER — ACETAMINOPHEN 650 MG RE SUPP: 650.0000 mg | Freq: Four times a day (QID) | RECTAL | Status: DC | PRN

## 2023-03-16 MED ORDER — DORZOLAMIDE HCL 2 % OP SOLN
1.0000 [drp] | Freq: Two times a day (BID) | OPHTHALMIC | Status: DC
  Administered 2023-03-16 – 2023-03-19 (×6): 1 [drp] via OPHTHALMIC
  Filled 2023-03-16: qty 10

## 2023-03-16 MED ORDER — ONDANSETRON HCL 4 MG/2ML IJ SOLN: 4.0000 mg | Freq: Four times a day (QID) | INTRAMUSCULAR | Status: DC | PRN

## 2023-03-16 MED ORDER — METOPROLOL TARTRATE 25 MG PO TABS
12.5000 mg | ORAL_TABLET | Freq: Two times a day (BID) | ORAL | Status: DC
  Administered 2023-03-16 – 2023-03-19 (×6): 12.5 mg via ORAL
  Filled 2023-03-16 (×6): qty 1

## 2023-03-16 MED ORDER — MIRABEGRON ER 25 MG PO TB24
25.0000 mg | ORAL_TABLET | Freq: Every day | ORAL | Status: DC
  Administered 2023-03-17 – 2023-03-19 (×3): 25 mg via ORAL
  Filled 2023-03-16 (×3): qty 1

## 2023-03-16 MED ORDER — CAMPHOR-MENTHOL 0.5-0.5 % EX LOTN: 1.0000 | TOPICAL_LOTION | Freq: Four times a day (QID) | CUTANEOUS | Status: DC | PRN

## 2023-03-16 MED ORDER — METRONIDAZOLE 500 MG/100ML IV SOLN
500.0000 mg | Freq: Two times a day (BID) | INTRAVENOUS | Status: DC
  Administered 2023-03-16 – 2023-03-17 (×3): 500 mg via INTRAVENOUS
  Filled 2023-03-16 (×3): qty 100

## 2023-03-16 MED ORDER — SODIUM CHLORIDE 0.9 % IV BOLUS
1000.0000 mL | Freq: Once | INTRAVENOUS | Status: AC
  Administered 2023-03-16: 1000 mL via INTRAVENOUS

## 2023-03-16 MED ORDER — SODIUM CHLORIDE 0.9 % IV SOLN
1.0000 g | Freq: Once | INTRAVENOUS | Status: AC
  Administered 2023-03-16: 1 g via INTRAVENOUS
  Filled 2023-03-16: qty 10

## 2023-03-16 MED ORDER — TIMOLOL MALEATE 0.5 % OP SOLN
1.0000 [drp] | Freq: Two times a day (BID) | OPHTHALMIC | Status: DC
  Administered 2023-03-16 – 2023-03-19 (×6): 1 [drp] via OPHTHALMIC
  Filled 2023-03-16: qty 5

## 2023-03-16 MED ORDER — INSULIN GLARGINE-YFGN 100 UNIT/ML ~~LOC~~ SOLN
10.0000 [IU] | Freq: Every day | SUBCUTANEOUS | Status: DC
  Administered 2023-03-17 – 2023-03-19 (×3): 10 [IU] via SUBCUTANEOUS
  Filled 2023-03-16 (×3): qty 0.1

## 2023-03-16 MED ORDER — POLYETHYLENE GLYCOL 3350 17 GM/SCOOP PO POWD: 17.0000 g | Freq: Two times a day (BID) | ORAL | Status: DC | PRN

## 2023-03-16 MED ORDER — ACETAMINOPHEN 500 MG PO TABS: 1000.0000 mg | ORAL_TABLET | Freq: Once | ORAL | Status: DC

## 2023-03-16 MED ORDER — ACETAMINOPHEN 325 MG PO TABS: 650.0000 mg | ORAL_TABLET | Freq: Four times a day (QID) | ORAL | Status: DC | PRN

## 2023-03-16 MED ORDER — LATANOPROSTENE BUNOD 0.024 % OP SOLN: 1.0000 [drp] | Freq: Every day | OPHTHALMIC | Status: DC

## 2023-03-16 MED ORDER — TAMSULOSIN HCL 0.4 MG PO CAPS
0.4000 mg | ORAL_CAPSULE | Freq: Every day | ORAL | Status: DC
  Administered 2023-03-17 – 2023-03-19 (×3): 0.4 mg via ORAL
  Filled 2023-03-16 (×3): qty 1

## 2023-03-16 MED ORDER — ACETAMINOPHEN 650 MG RE SUPP
RECTAL | Status: AC
  Administered 2023-03-16: 650 mg via RECTAL
  Filled 2023-03-16: qty 1

## 2023-03-16 MED ORDER — SENNA 8.6 MG PO TABS: 1.0000 | ORAL_TABLET | Freq: Every day | ORAL | Status: DC | PRN

## 2023-03-16 MED ORDER — METFORMIN HCL ER 500 MG PO TB24
1000.0000 mg | ORAL_TABLET | Freq: Every day | ORAL | Status: DC
  Administered 2023-03-17 – 2023-03-19 (×3): 1000 mg via ORAL
  Filled 2023-03-16 (×3): qty 2

## 2023-03-16 MED ORDER — ONDANSETRON HCL 4 MG PO TABS: 4.0000 mg | ORAL_TABLET | Freq: Four times a day (QID) | ORAL | Status: DC | PRN

## 2023-03-16 MED ORDER — ALBUTEROL SULFATE (2.5 MG/3ML) 0.083% IN NEBU: 2.5000 mg | INHALATION_SOLUTION | RESPIRATORY_TRACT | Status: DC | PRN

## 2023-03-16 MED ORDER — DORZOLAMIDE HCL-TIMOLOL MAL 2-0.5 % OP SOLN: 1.0000 [drp] | Freq: Two times a day (BID) | OPHTHALMIC | Status: DC

## 2023-03-16 MED ORDER — NIRMATRELVIR/RITONAVIR (PAXLOVID)TABLET
3.0000 | ORAL_TABLET | Freq: Two times a day (BID) | ORAL | Status: DC
  Administered 2023-03-16 – 2023-03-19 (×6): 3 via ORAL
  Filled 2023-03-16: qty 30

## 2023-03-16 MED ORDER — MAGNESIUM OXIDE -MG SUPPLEMENT 400 (240 MG) MG PO TABS
400.0000 mg | ORAL_TABLET | Freq: Every day | ORAL | Status: DC
  Administered 2023-03-16 – 2023-03-19 (×4): 400 mg via ORAL
  Filled 2023-03-16 (×4): qty 1

## 2023-03-16 MED ORDER — POTASSIUM CHLORIDE CRYS ER 20 MEQ PO TBCR: 20.0000 meq | EXTENDED_RELEASE_TABLET | Freq: Once | ORAL | Status: DC

## 2023-03-16 MED ORDER — ENOXAPARIN SODIUM 40 MG/0.4ML IJ SOSY
40.0000 mg | PREFILLED_SYRINGE | INTRAMUSCULAR | Status: DC
  Administered 2023-03-16 – 2023-03-18 (×3): 40 mg via SUBCUTANEOUS
  Filled 2023-03-16 (×3): qty 0.4

## 2023-03-16 MED ORDER — INSULIN ASPART 100 UNIT/ML IJ SOLN
0.0000 [IU] | Freq: Three times a day (TID) | INTRAMUSCULAR | Status: DC
  Administered 2023-03-16: 2 [IU] via SUBCUTANEOUS
  Administered 2023-03-16: 1 [IU] via SUBCUTANEOUS
  Administered 2023-03-16 – 2023-03-17 (×3): 2 [IU] via SUBCUTANEOUS
  Administered 2023-03-17 – 2023-03-18 (×3): 3 [IU] via SUBCUTANEOUS
  Administered 2023-03-18: 1 [IU] via SUBCUTANEOUS
  Administered 2023-03-19: 2 [IU] via SUBCUTANEOUS
  Filled 2023-03-16: qty 0.09

## 2023-03-16 MED ORDER — LEVOTHYROXINE SODIUM 50 MCG PO TABS
50.0000 ug | ORAL_TABLET | Freq: Every day | ORAL | Status: DC
  Administered 2023-03-17 – 2023-03-19 (×3): 50 ug via ORAL
  Filled 2023-03-16 (×3): qty 1

## 2023-03-16 MED ORDER — IPRATROPIUM-ALBUTEROL 0.5-2.5 (3) MG/3ML IN SOLN
3.0000 mL | Freq: Four times a day (QID) | RESPIRATORY_TRACT | Status: DC
  Administered 2023-03-16 – 2023-03-17 (×4): 3 mL via RESPIRATORY_TRACT
  Filled 2023-03-16 (×4): qty 3

## 2023-03-16 MED ORDER — ASPIRIN 81 MG PO TBEC
81.0000 mg | DELAYED_RELEASE_TABLET | Freq: Every day | ORAL | Status: DC
  Administered 2023-03-16 – 2023-03-19 (×4): 81 mg via ORAL
  Filled 2023-03-16 (×4): qty 1

## 2023-03-16 MED ORDER — POLYETHYLENE GLYCOL 3350 17 G PO PACK: 17.0000 g | PACK | Freq: Two times a day (BID) | ORAL | Status: DC | PRN

## 2023-03-16 MED ORDER — BRIMONIDINE TARTRATE 0.2 % OP SOLN
1.0000 [drp] | Freq: Two times a day (BID) | OPHTHALMIC | Status: DC
  Administered 2023-03-16 – 2023-03-19 (×6): 1 [drp] via OPHTHALMIC
  Filled 2023-03-16: qty 5

## 2023-03-16 NOTE — ED Notes (Signed)
ED TO INPATIENT HANDOFF REPORT  ED Nurse Name and Phone #: Dorene Sorrow Name/Age/Gender Hayley Fisher 89 y.o. female Room/Bed: WA15/WA15  Code Status   Code Status: Full Code  Home/SNF/Other Home Patient oriented to: self Is this baseline? No   Triage Complete: Triage complete  Chief Complaint Acute hypoxemic respiratory failure (HCC) [J96.01]  Triage Note From home- nonproductive cough and sore throat. Dx with UTI when seen here yesterday. Tonight c/o chest pain with coughing and deep inspiration. Pain with swallowing. 86% RA oxygen. CBG 247. Febrile and 500cc NS given by EMS.    Allergies Allergies  Allergen Reactions   Tramadol Itching    Takes occasionally with benadryl   Levemir [Insulin Detemir] Itching    Level of Care/Admitting Diagnosis ED Disposition     ED Disposition  Admit   Condition  --   Comment  Hospital Area: [facility] [[id number]]  Level of Care: Progressive [102]  Admit to Progressive based on following criteria: RESPIRATORY PROBLEMS hypoxemic/hypercapnic respiratory failure that is responsive to NIPPV (BiPAP) or High Flow Nasal Cannula (6-80 lpm). Frequent assessment/intervention, no > Q2 hrs < Q4 hrs, to maintain oxygenation and pulmonary hygiene.  May admit patient to [facility] or [facility] if equivalent level of care is available:: Yes  Covid Evaluation: Symptomatic Person Under Investigation (PUI) or recent exposure (last 10 days) *Testing Required*  Diagnosis: Acute hypoxemic respiratory failure [facility]) [[id number]]  Admitting Physician: Darlin Drop [[id number]]  Attending Physician: Darlin Drop [[id number]]  Certification:: I certify this patient will need inpatient services for at least 2 midnights  Estimated Length of Stay: 2          B Medical/Surgery History Past Medical History:  Diagnosis Date   Arthritis    Cataract    bil cateracts removed   Chronic kidney disease    "spot on one of my kidneys" per pt    Diabetes mellitus    Diarrhea 02/20/2022   Gastroesophageal reflux disease without esophagitis 09/16/2020   Glaucoma    Goals of care, counseling/discussion 09/09/2021   Hyperkalemia 07/02/2020   Hyperlipidemia    Hyperplastic colon polyp    Hypertension    Hypothyroidism 12/09/2020   Leukocytopenia 06/27/2021   Metabolic acidosis 07/02/2020   S/P IVC filter 01/16/2021   DVT 12/2020   Syncope    Thyroid disease    Urge incontinence 05/05/2022   Past Surgical History:  Procedure Laterality Date   ABDOMINAL HYSTERECTOMY     BLADDER SUSPENSION     bladder tacking     COLONOSCOPY     EYE SURGERY     INGUINAL HERNIA REPAIR Right    IR CATHETER TUBE CHANGE  09/15/2022   IR IVC FILTER PLMT / S&I /IMG GUID/MOD SED  01/16/2021   IR US GUIDE BX ASP/DRAIN  08/02/2022     A IV Location/Drains/Wounds Patient Lines/Drains/Airways Status     Active Line/Drains/Airways     Name Placement date Placement time Site Days   Peripheral IV 03/16/23 20 G Anterior;Left Forearm 03/16/23  0356  Forearm  less than 1   Peripheral IV 03/16/23 20 G Anterior;Proximal;Right Forearm 03/16/23  0423  Forearm  less than 1   Closed System Drain Anterior;Medial Abdomen 16 Fr. 09/15/22  1057  Abdomen  182   Pressure Injury 11/04/21 Buttocks Mid Stage 1 -  Intact skin with non-blanchable redness of a localized area usually over a bony prominence. 11/04/21  1450  -- 497  Intake/Output Last 24 hours  Intake/Output Summary (Last 24 hours) at 03/16/2023 1122 Last data filed at 03/16/2023 0102 Gross per 24 hour  Intake 100 ml  Output --  Net 100 ml    Labs/Imaging Results for orders placed or performed during the hospital encounter of 03/16/23 (from the past 48 hour(s))  Comprehensive metabolic panel     Status: Abnormal   Collection Time: 03/16/23  4:15 AM  Result Value Ref Range   Sodium 136 135 - 145 mmol/L   Potassium 3.5 3.5 - 5.1 mmol/L   Chloride 97 (L) 98 - 111 mmol/L   CO2 26 22 -  32 mmol/L   Glucose, Bld 228 (H) 70 - 99 mg/dL    Comment: Glucose reference range applies only to samples taken after fasting for at least 8 hours.   BUN 12 8 - 23 mg/dL   Creatinine, Ser 7.25 0.44 - 1.00 mg/dL   Calcium 8.7 (L) 8.9 - 10.3 mg/dL   Total Protein 7.4 6.5 - 8.1 g/dL   Albumin 3.9 3.5 - 5.0 g/dL   AST 19 15 - 41 U/L   ALT 12 0 - 44 U/L   Alkaline Phosphatase 61 38 - 126 U/L   Total Bilirubin 0.9 0.3 - 1.2 mg/dL   GFR, Estimated >36 >64 mL/min    Comment: (NOTE) Calculated using the CKD-EPI Creatinine Equation (2021)    Anion gap 13 5 - 15    Comment: Performed at [facility], [address] [place], [place] [address]  CBC with Differential     Status: Abnormal   Collection Time: 03/16/23  4:15 AM  Result Value Ref Range   WBC 4.7 4.0 - 10.5 K/uL   RBC 3.63 (L) 3.87 - 5.11 MIL/uL   Hemoglobin 11.0 (L) 12.0 - 15.0 g/dL   HCT 42.5 (L) 95.6 - 38.7 %   MCV 95.9 80.0 - 100.0 fL   MCH 30.3 26.0 - 34.0 pg   MCHC 31.6 30.0 - 36.0 g/dL   RDW 56.4 33.2 - 95.1 %   Platelets 167 150 - 400 K/uL   nRBC 0.0 0.0 - 0.2 %   Neutrophils Relative % 71 %   Neutro Abs 3.3 1.7 - 7.7 K/uL   Lymphocytes Relative 21 %   Lymphs Abs 1.0 0.7 - 4.0 K/uL   Monocytes Relative 8 %   Monocytes Absolute 0.4 0.1 - 1.0 K/uL   Eosinophils Relative 0 %   Eosinophils Absolute 0.0 0.0 - 0.5 K/uL   Basophils Relative 0 %   Basophils Absolute 0.0 0.0 - 0.1 K/uL   Immature Granulocytes 0 %   Abs Immature Granulocytes 0.02 0.00 - 0.07 K/uL    Comment: Performed at [facility], [address], [place], [place] [address]  Protime-INR     Status: None   Collection Time: 03/16/23  4:15 AM  Result Value Ref Range   Prothrombin Time 14.2 11.4 - 15.2 seconds   INR 1.1 0.8 - 1.2    Comment: (NOTE) INR goal varies based on device and disease states. Performed at [facility], [address] [place], [place] [address]   APTT     Status: None    Collection Time: 03/16/23  4:15 AM  Result Value Ref Range   aPTT 31 24 - 36 seconds    Comment: Performed at [facility], [address], [place], [place] [address]  Magnesium     Status: Abnormal  Collection Time: 03/16/23  4:15 AM  Result Value Ref Range   Magnesium 1.4 (L) 1.7 - 2.4 mg/dL    Comment: Performed at [facility], [address] [place], [place] [address]  Phosphorus     Status: None   Collection Time: 03/16/23  4:15 AM  Result Value Ref Range   Phosphorus 2.9 2.5 - 4.6 mg/dL    Comment: Performed at [facility], [address] [place], [place] [address]  Hemoglobin A1c     Status: Abnormal   Collection Time: 03/16/23  4:15 AM  Result Value Ref Range   Hgb A1c MFr Bld 8.5 (H) 4.8 - 5.6 %    Comment: (NOTE) Pre diabetes:          5.7%-6.4%  Diabetes:              >6.4%  Glycemic control for   <7.0% adults with diabetes    Mean Plasma Glucose 197.25 mg/dL    Comment: Performed at [facility], [address], [place], [place] [address]  I-Stat Lactic Acid, ED     Status: None   Collection Time: 03/16/23  5:39 AM  Result Value Ref Range   Lactic Acid, Venous 0.8 0.5 - 1.9 mmol/L  Resp panel by RT-PCR (RSV, Flu A&B, Covid)     Status: Abnormal   Collection Time: 03/16/23  8:32 AM  Result Value Ref Range   SARS Coronavirus 2 by RT PCR POSITIVE (A) NEGATIVE    Comment: (NOTE) SARS-CoV-2 target nucleic acids are DETECTED.  The SARS-CoV-2 RNA is generally detectable in upper respiratory specimens during the acute phase of infection. Positive results are indicative of the presence of the identified virus, but do not rule out bacterial infection or co-infection with other pathogens not detected by the test. Clinical correlation with patient history and other diagnostic information is necessary to determine patient infection status. The expected result is Negative.  Fact Sheet for  Patients: BloggerCourse.com  Fact Sheet for Healthcare Providers: SeriousBroker.it  This test is not yet approved or cleared by the [place] FDA and  has been authorized for detection and/or diagnosis of SARS-CoV-2 by FDA under an Emergency Use Authorization (EUA).  This EUA will remain in effect (meaning this test can be used) for the duration of  the COVID-19 declaration under Section 564(b)(1) of the A ct, 21 U.S.C. section 360bbb-3(b)(1), unless the authorization is terminated or revoked sooner.     Influenza A by PCR NEGATIVE NEGATIVE   Influenza B by PCR NEGATIVE NEGATIVE    Comment: (NOTE) The Xpert Xpress SARS-CoV-2/FLU/RSV plus assay is intended as an aid in the diagnosis of influenza from Nasopharyngeal swab specimens and should not be used as a sole basis for treatment. Nasal washings and aspirates are unacceptable for Xpert Xpress SARS-CoV-2/FLU/RSV testing.  Fact Sheet for Patients: BloggerCourse.com  Fact Sheet for Healthcare Providers: SeriousBroker.it  This test is not yet approved or cleared by the [place] FDA and has been authorized for detection and/or diagnosis of SARS-CoV-2 by FDA under an Emergency Use Authorization (EUA). This EUA will remain in effect (meaning this test can be used) for the duration of the COVID-19 declaration under Section 564(b)(1) of the Act, 21 U.S.C. section 360bbb-3(b)(1), unless the authorization is terminated or revoked.     Resp Syncytial Virus by PCR NEGATIVE NEGATIVE    Comment: (NOTE) Fact Sheet for Patients: BloggerCourse.com  Fact Sheet for Healthcare Providers: SeriousBroker.it  This test is  not yet approved or cleared by the [place] and has been authorized for detection and/or diagnosis of SARS-CoV-2 by FDA under an Emergency Use Authorization  (EUA). This EUA will remain in effect (meaning this test can be used) for the duration of the COVID-19 declaration under Section 564(b)(1) of the Act, 21 U.S.C. section 360bbb-3(b)(1), unless the authorization is terminated or revoked.  Performed at [facility], [address] [place], [place] [address]   CBG monitoring, ED     Status: Abnormal   Collection Time: 03/16/23  8:50 AM  Result Value Ref Range   Glucose-Capillary 197 (H) 70 - 99 mg/dL    Comment: Glucose reference range applies only to samples taken after fasting for at least 8 hours.   DG Chest Port 1 View  Result Date: 03/16/2023 CLINICAL DATA:  Questionable sepsis.  Check lung status. EXAM: PORTABLE CHEST 1 VIEW COMPARISON:  AP lateral chest 03/14/2023 FINDINGS: The heart is moderately enlarged. There is mild central vascular prominence which was seen previously There is no overt edema. The mediastinum is stable, with mild aortic tortuosity, atherosclerosis. The lungs are expiratory. There is increased opacity in the left-greater-than-right lung bases, on the left could be asymmetric hypoventilatory change or pneumonia versus aspiration. On the right appears to be atelectatic bands. The visualized lungs are otherwise clear. No substantial pleural effusion is seen. No new osseous findings. The patient was moderately rotated to the left on this study. IMPRESSION: 1. Expiratory exam with increased opacity in the left-greater-than-right lung bases, on the left could be asymmetric hypoventilatory change or pneumonia versus aspiration. 2. Stable cardiomegaly and mild central vascular prominence without overt edema. 3. Aortic atherosclerosis. Electronically Signed   By: Almira Bar M.D.   On: 03/16/2023 05:30   DG Chest 2 View  Result Date: 03/14/2023 CLINICAL DATA:  Syncope. EXAM: CHEST - 2 VIEW COMPARISON:  08/02/2021 FINDINGS: Heart size and mediastinal contours are unremarkable. Lung volumes are low. No signs  of pleural effusion or edema. Bandlike areas of scarring versus atelectasis noted within both lung bases. No airspace consolidation. Visualized osseous structures appear intact. IMPRESSION: Low lung volumes with bibasilar scarring versus atelectasis. Electronically Signed   By: Signa Kell M.D.   On: 03/14/2023 15:59    Pending Labs Unresulted Labs (From admission, onward)     Start     Ordered   03/17/23 0500  CBC  Tomorrow morning,   R        03/16/23 0716   03/17/23 0500  Comprehensive metabolic panel  Tomorrow morning,   R        03/16/23 0716   03/16/23 0413  Urine culture  (Undifferentiated presentation (screening labs and basic nursing orders))  Once,   URGENT       Question:  Indication  Answer:  Sepsis   03/16/23 0413   03/16/23 0412  Blood Culture (routine x 2)  (Undifferentiated presentation (screening labs and basic nursing orders))  BLOOD CULTURE X 2,   STAT      03/16/23 0413            Vitals/Pain Today's Vitals   03/16/23 0700 03/16/23 0730 03/16/23 0900 03/16/23 1052  BP: (!) 153/87 (!) 160/96 (!) 151/95 133/76  Pulse: (!) 127 (!) 132 (!) 123 (!) 121  Resp: 20 (!) 22  (!) 21  Temp:  99.9 F (37.7 C)    TempSrc:      SpO2: 97% 93% 94% 96%  PainSc:  Isolation Precautions No active isolations  Medications Medications  enoxaparin (LOVENOX) injection 40 mg (has no administration in time range)  potassium chloride SA (KLOR-CON M) CR tablet 20 mEq (20 mEq Oral Not Given 03/16/23 0838)  acetaminophen (TYLENOL) tablet 650 mg (has no administration in time range)    Or  acetaminophen (TYLENOL) suppository 650 mg (has no administration in time range)  ondansetron (ZOFRAN) tablet 4 mg (has no administration in time range)    Or  ondansetron (ZOFRAN) injection 4 mg (has no administration in time range)  insulin aspart (novoLOG) injection 0-9 Units (2 Units Subcutaneous Given 03/16/23 0937)  sodium chloride 0.9 % bolus 1,000 mL (0 mLs Intravenous Stopped  03/16/23 0854)  acetaminophen (TYLENOL) 650 MG suppository (650 mg Rectal Given 03/16/23 0421)  cefTRIAXone (ROCEPHIN) 1 g in sodium chloride 0.9 % 100 mL IVPB (0 g Intravenous Stopped 03/16/23 0628)  magnesium sulfate IVPB 2 g 50 mL (2 g Intravenous New Bag/Given 03/16/23 0838)    Mobility non-ambulatory     Focused Assessments Cardiac Assessment Handoff:  Cardiac Rhythm: Sinus tachycardia Lab Results  Component Value Date   CKTOTAL 27 (L) 01/15/2021   TROPONINI <0.03 07/09/2018   Lab Results  Component Value Date   DDIMER 2.39 (H) 11/02/2021   Does the Patient currently have chest pain? No    R Recommendations: See Admitting Provider Note  Report given to:   Additional Notes:

## 2023-03-16 NOTE — H&P (Signed)
History and Physical    Patient: Hayley Fisher WUJ:[id number] DOB: 1931-06-09 DOA: 03/16/2023 DOS: the patient was seen and examined on 03/16/2023 PCP: Levin Erp, MD  Patient coming from: Home  Chief Complaint:  Chief Complaint  Patient presents with   Code Sepsis   HPI: Hayley Fisher is a 89 y.o. female with medical history significant of osteoarthritis, bilateral cataracts, diarrhea, glaucoma, unspecified chronic kidney disease, hyperkalemia, type II DM, hyperlipidemia, colon polyps, hypertension, hypothyroidism, history of metabolic acidosis, history of syncopal episode, urgent continence who was sent from her nursing facility due to nonproductive cough and sore throat.  She was seen yesterday and was diagnosed with a UTI.  The patient is only oriented to name.  She is able to answer simple questions though.  She denied headache, abdominal, back or chest pain at the time of my examination.  Lab work: COVID-19 PCR was positive.  CBCs are white count 4.7, hemoglobin 11.0 g/dL platelets 956.  Hemoglobin A1c was 8.5%.  Normal PT, INR and PTT.  Normal lactic acid.  Phosphorus 2.9 and magnesium 1.4 mg/dL.  CMP showed a chloride of 97 mmol/L, glucose 228 and corrected calcium 8.8 mg/dL.  Imaging: Portable 1 view chest radiograph showing respiratory exam with increased opacity in the left greater than right lung bases, on the left could be a symmetric hypoventilatory change versus pneumonia or aspiration.  Stable cardiomegaly and mild central vascular prominence without overt edema.   ED course: Initial vital signs were temperature 101.1 F, pulse 132, respiration 17, BP 196/119 mmHg and O2 sat 90% on room air.  The patient received ceftriaxone 1 g IVPB and 1000 mL of normal saline bolus.  Review of Systems: As mentioned in the history of present illness. All other systems reviewed and are negative. Past Medical History:  Diagnosis Date   Arthritis    Cataract    bil cateracts removed    Chronic kidney disease    "spot on one of my kidneys" per pt   Diabetes mellitus    Diarrhea 02/20/2022   Gastroesophageal reflux disease without esophagitis 09/16/2020   Glaucoma    Goals of care, counseling/discussion 09/09/2021   Hyperkalemia 07/02/2020   Hyperlipidemia    Hyperplastic colon polyp    Hypertension    Hypothyroidism 12/09/2020   Leukocytopenia 06/27/2021   Metabolic acidosis 07/02/2020   S/P IVC filter 01/16/2021   DVT 12/2020   Syncope    Thyroid disease    Urge incontinence 05/05/2022   Past Surgical History:  Procedure Laterality Date   ABDOMINAL HYSTERECTOMY     BLADDER SUSPENSION     bladder tacking     COLONOSCOPY     EYE SURGERY     INGUINAL HERNIA REPAIR Right    IR CATHETER TUBE CHANGE  09/15/2022   IR IVC FILTER PLMT / S&I /IMG GUID/MOD SED  01/16/2021   IR US GUIDE BX ASP/DRAIN  08/02/2022   Social History:  reports that she quit smoking about 42 years ago. Her smoking use included cigarettes. She has never used smokeless tobacco. She reports that she does not drink alcohol and does not use drugs.  Allergies  Allergen Reactions   Tramadol Itching    Takes occasionally with benadryl   Levemir [Insulin Detemir] Itching    Family History  Problem Relation Age of Onset   Colon cancer Mother    Other Father        cerebral hemorrhage   Diabetes Brother    Diabetes Sister  x 2   Bone cancer Daughter    Breast cancer Daughter 59   Esophageal cancer Neg Hx    Rectal cancer Neg Hx    Stomach cancer Neg Hx     Prior to Admission medications   Medication Sig Start Date End Date Taking? Authorizing Provider  acetaminophen (TYLENOL) 500 MG tablet Take 1,000 mg by mouth every 6 (six) hours as needed for moderate pain.    [provider]  aspirin 81 MG chewable tablet Chew 81 mg by mouth daily.    [provider]  brimonidine (ALPHAGAN) 0.2 % ophthalmic solution Place 1 drop into the left eye 2 (two) times daily. 03/30/21    [provider]  cephALEXin (KEFLEX) 500 MG capsule Take 1 capsule (500 mg total) by mouth 4 (four) times daily. 03/14/23   Bethann Berkshire, MD  dorzolamide-timolol (COSOPT) 22.3-6.8 MG/ML ophthalmic solution Place 1 drop into both eyes 2 (two) times daily. 05/18/20   [provider]  doxepin (SINEQUAN) 10 MG/ML solution Take 1 mL (10 mg total) by mouth at bedtime as needed for sleep. 05/05/22   McDiarmid, Leighton Roach, MD  famotidine (PEPCID) 20 MG tablet TAKE 1 TABLET BY MOUTH EVERY DAY 12/08/22   Levin Erp, MD  Ferrous Sulfate (IRON) 325 (65 Fe) MG TABS TAKE 1 TABLET BY MOUTH DAILY WITH BREAKFAST 09/13/22   Levin Erp, MD  glucose blood (ACCU-CHEK AVIVA PLUS) test strip USE TO TEST BLOOD SUGAR UP  TO 3 TIMES A DAY. 05/05/22   Shamleffer, Konrad Dolores, MD  insulin glargine (LANTUS SOLOSTAR) 100 UNIT/ML Solostar Pen Inject 10 Units into the skin daily. 02/10/23   Shamleffer, Konrad Dolores, MD  Insulin Pen Needle (GLOBAL EASE INJECT PEN NEEDLES) 32G X 4 MM MISC USE 1 SYRINGE DAILY TO INJECT VICTOZA 12/26/22   Shamleffer, Konrad Dolores, MD  Insulin Syringe-Needle U-100 (SURE COMFORT INSULIN SYRINGE) 31G X 5/16" 0.3 ML MISC Inject 1 Syringe into the skin daily. 02/25/21   Lurene Shadow, MD  Insulin Syringe-Needle U-100 (TRUEPLUS INSULIN SYRINGE) 31G X 5/16" 0.3 ML MISC Use as directed 02/24/22   Shamleffer, Konrad Dolores, MD  Latanoprostene Bunod (VYZULTA) 0.024 % SOLN Place 1 drop into both eyes at bedtime.    [provider]  levothyroxine (SYNTHROID) 50 MCG tablet Take 1 tablet (50 mcg total) by mouth daily. 12/26/22   Shamleffer, Konrad Dolores, MD  metFORMIN (GLUCOPHAGE-XR) 500 MG 24 hr tablet Take 2 tablets (1,000 mg total) by mouth daily with breakfast. 12/26/22   Shamleffer, Konrad Dolores, MD  Misc. Devices (TRANSFER BOARD) MISC Use as needed for transfer from bed to chair or vice versa 02/25/21   Lurene Shadow, MD  MYRBETRIQ 25 MG TB24 tablet TAKE 1 TABLET BY MOUTH  EVERY DAY 02/02/23   Levin Erp, MD  polyethylene glycol powder (GLYCOLAX/MIRALAX) 17 GM/SCOOP powder Take 17 g by mouth in the morning and at bedtime. Patient taking differently: Take 17 g by mouth 2 (two) times daily as needed for mild constipation. 08/20/21   Levin Erp, MD  SARNA lotion Apply 1 application topically 4 (four) times daily as needed for itching. 05/27/21   [provider]  senna (SENOKOT) 8.6 MG TABS tablet Take 1 tablet (8.6 mg total) by mouth 2 (two) times daily. Patient taking differently: Take 1 tablet by mouth daily as needed for mild constipation. 08/09/21   Alicia Amel, MD  tamsulosin (FLOMAX) 0.4 MG CAPS capsule TAKE 1 CAPSULE BY MOUTH EVERY DAY 03/13/23   Laroy Apple,  Mayuri, MD    Physical Exam: Vitals:   03/16/23 0515 03/16/23 0545 03/16/23 0600 03/16/23 0628  BP: (!) 157/119 (!) 169/105 (!) 181/49 (!) 174/112  Pulse: (!) 137 (!) 129 (!) 139 (!) 128  Resp: 15 (!) 21 (!) 21 (!) 22  Temp:    99.4 F (37.4 C)  TempSrc:    Oral  SpO2: 98% 97% 97% 96%   Physical Exam Vitals and nursing note reviewed.  Constitutional:      General: She is awake. She is not in acute distress.    Appearance: Normal appearance.  HENT:     Head: Normocephalic.     Nose: No rhinorrhea.     Mouth/Throat:     Mouth: Mucous membranes are moist.  Eyes:     General: No scleral icterus.    Pupils: Pupils are equal, round, and reactive to light.  Cardiovascular:     Rate and Rhythm: Normal rate and regular rhythm.  Pulmonary:     Effort: Pulmonary effort is normal. No respiratory distress.     Breath sounds: Wheezing, rhonchi and rales present.  Abdominal:     General: Bowel sounds are normal.     Palpations: Abdomen is soft.  Musculoskeletal:     Cervical back: Neck supple.     Right lower leg: No edema.     Left lower leg: No edema.  Skin:    General: Skin is warm and dry.  Neurological:     Mental Status: She is alert. She is disoriented.   Psychiatric:        Mood and Affect: Mood normal.        Behavior: Behavior is cooperative.     Data Reviewed:  Results are pending, will review when available.  Assessment and Plan: Principal Problem:   Acute hypoxemic respiratory failure (HCC) In the setting of:   COVID-19 virus infection With superimposed:   Aspiration into airway Admit to PCU/inpatient. Continue supplemental oxygen. DuoNebs 4 times a day. Albuterol nebs as needed. Steroids deferred due to DM/aspiration. Begin Paxlovid twice daily. Continue ceftriaxone 1 g IVPB daily. Continue metronidazole 500 mg IVPB q 12 hr. Follow-up blood culture and sensitivity Follow CBC and CMP in a.m.  Active Problems:   Normocytic anemia Monitor hematocrit and hemoglobin.    Hypocalcemia Recheck calcium and albumin level in AM. Will likely normalize after magnesium correction.    Hypomagnesemia Replacement ordered.    Type 2 diabetes mellitus with diabetic neuropathy (HCC) Carbohydrate modified diet. CBG monitoring with RI SS. Check hemoglobin A1c.    Hyperlipidemia associated with type 2 diabetes mellitus (HCC) Follow-up with primary care provider.    Hypertension associated with diabetes (HCC) Will start low-dose metoprolol 12.5 mg p.o. twice daily.    Mild dementia (HCC) Supportive care.    Glaucoma Continue Cosopt, latanoprost and basal to drops. Follow-up with ophthalmology as an outpatient.    Hypothyroidism Continue levothyroxine 50 mcg p.o. daily.    Incontinence Recently diagnosed with UTI. Continue Myrbetriq. On ceftriaxone. Follow-up urine culture and sensitivity.    Constipation Continue as needed MiraLAX and Senokot. Will add magnesium oxide 400 mg p.o. daily.    Advance Care Planning:   Code Status: Full Code   Consults:   Family Communication:   Severity of Illness: The appropriate patient status for this patient is INPATIENT. Inpatient status is judged to be reasonable and  necessary in order to provide the required intensity of service to ensure the patient's safety. The patient's presenting symptoms,  physical exam findings, and initial radiographic and laboratory data in the context of their chronic comorbidities is felt to place them at high risk for further clinical deterioration. Furthermore, it is not anticipated that the patient will be medically stable for discharge from the hospital within 2 midnights of admission.   * I certify that at the point of admission it is my clinical judgment that the patient will require inpatient hospital care spanning beyond 2 midnights from the point of admission due to high intensity of service, high risk for further deterioration and high frequency of surveillance required.*  Author: Bobette Mo, MD 03/16/2023 7:19 AM  For on call review [email or web address].   This document was prepared using Dragon voice recognition software and may contain some unintended transcription errors.

## 2023-03-16 NOTE — ED Triage Notes (Addendum)
From home- nonproductive cough and sore throat. Dx with UTI when seen here yesterday. Tonight c/o chest pain with coughing and deep inspiration. Pain with swallowing. 86% RA oxygen. CBG 247. Febrile and 500cc NS given by EMS.

## 2023-03-16 NOTE — ED Notes (Signed)
Resp panel collected for third time

## 2023-03-16 NOTE — ED Notes (Signed)
Contact and Airborne precautions in place until Resp panel results.

## 2023-03-16 NOTE — ED Provider Notes (Signed)
[place] EMERGENCY DEPARTMENT AT [facility] Provider Note  CSN: [id number] Arrival date & time: 03/16/23 0350  Chief Complaint(s) Code Sepsis  HPI Hayley Fisher is a 89 y.o. female with a past medical history listed below who presents to the emergency department for nonproductive cough and sore throat that started yesterday.  Patient began having coughing and increased work of breathing tonight prompting a call to EMS.  They noted sats of 86% on room air.  Patient was recently seen in the emergency department for near syncope and is being treated for urinary tract infection.  HPI  Past Medical History Past Medical History:  Diagnosis Date   Arthritis    Cataract    bil cateracts removed   Chronic kidney disease    "spot on one of my kidneys" per pt   Diabetes mellitus    Diarrhea 02/20/2022   Gastroesophageal reflux disease without esophagitis 09/16/2020   Glaucoma    Goals of care, counseling/discussion 09/09/2021   Hyperkalemia 07/02/2020   Hyperlipidemia    Hyperplastic colon polyp    Hypertension    Hypothyroidism 12/09/2020   Leukocytopenia 06/27/2021   Metabolic acidosis 07/02/2020   S/P IVC filter 01/16/2021   DVT 12/2020   Syncope    Thyroid disease    Urge incontinence 05/05/2022   Patient Active Problem List   Diagnosis Date Noted   Acute hypoxemic respiratory failure (HCC) 03/16/2023   Weight loss, unintentional 05/06/2022   Urge incontinence 05/05/2022   Diabetic neuropathy (HCC) 05/05/2022   Chronic suprapubic catheter (HCC)    Polypharmacy 03/02/2022   Type 2 diabetes mellitus with hyperglycemia, with long-term current use of insulin (HCC) 02/11/2022   DVT (deep venous thrombosis) (HCC) 11/04/2021   Mood disorder (HCC) 09/09/2021   Constipation 08/31/2021   Insomnia 08/31/2021   Incontinence 07/14/2021   Normocytic anemia 06/27/2021   Malnutrition of moderate degree 02/23/2021   Mild dementia (HCC) 02/21/2021   Hypothyroidism  12/09/2020   Unstable gait 04/03/2018   Frequent falls 04/03/2018   Age-related physical debility 03/22/2018   Nausea 03/16/2017   Skin ulcer of sacrum, limited to breakdown of skin (HCC) 03/16/2017   Spinal stenosis of lumbar region 01/13/2016   Chronic venous insufficiency 11/09/2015   DDD (degenerative disc disease), lumbosacral 11/24/2014   Frequent UTI 10/29/2012   Pruritus 03/17/2011   Onychomycosis 04/03/2009   Type 2 diabetes mellitus with diabetic neuropathy (HCC) 10/19/2006   Hyperlipidemia associated with type 2 diabetes mellitus (HCC) 10/19/2006   Glaucoma 10/19/2006   Hypertension associated with diabetes (HCC) 10/19/2006   Osteoarthritis, multiple sites 10/19/2006   Osteopenia 10/19/2006   Home Medication(s) Prior to Admission medications   Medication Sig Start Date End Date Taking? Authorizing Provider  acetaminophen (TYLENOL) 500 MG tablet Take 1,000 mg by mouth every 6 (six) hours as needed for moderate pain.    [provider]  aspirin 81 MG chewable tablet Chew 81 mg by mouth daily.    [provider]  brimonidine (ALPHAGAN) 0.2 % ophthalmic solution Place 1 drop into the left eye 2 (two) times daily. 03/30/21   [provider]  cephALEXin (KEFLEX) 500 MG capsule Take 1 capsule (500 mg total) by mouth 4 (four) times daily. 03/14/23   Bethann Berkshire, MD  dorzolamide-timolol (COSOPT) 22.3-6.8 MG/ML ophthalmic solution Place 1 drop into both eyes 2 (two) times daily. 05/18/20   [provider]  doxepin (SINEQUAN) 10 MG/ML solution Take 1 mL (10 mg total) by mouth at bedtime as needed  for sleep. 05/05/22   McDiarmid, Leighton Roach, MD  famotidine (PEPCID) 20 MG tablet TAKE 1 TABLET BY MOUTH EVERY DAY 12/08/22   Levin Erp, MD  Ferrous Sulfate (IRON) 325 (65 Fe) MG TABS TAKE 1 TABLET BY MOUTH DAILY WITH BREAKFAST 09/13/22   Levin Erp, MD  glucose blood (ACCU-CHEK AVIVA PLUS) test strip USE TO TEST BLOOD SUGAR UP  TO 3 TIMES A DAY. 05/05/22    Shamleffer, Konrad Dolores, MD  insulin glargine (LANTUS SOLOSTAR) 100 UNIT/ML Solostar Pen Inject 10 Units into the skin daily. 02/10/23   Shamleffer, Konrad Dolores, MD  Insulin Pen Needle (GLOBAL EASE INJECT PEN NEEDLES) 32G X 4 MM MISC USE 1 SYRINGE DAILY TO INJECT VICTOZA 12/26/22   Shamleffer, Konrad Dolores, MD  Insulin Syringe-Needle U-100 (SURE COMFORT INSULIN SYRINGE) 31G X 5/16" 0.3 ML MISC Inject 1 Syringe into the skin daily. 02/25/21   Lurene Shadow, MD  Insulin Syringe-Needle U-100 (TRUEPLUS INSULIN SYRINGE) 31G X 5/16" 0.3 ML MISC Use as directed 02/24/22   Shamleffer, Konrad Dolores, MD  Latanoprostene Bunod (VYZULTA) 0.024 % SOLN Place 1 drop into both eyes at bedtime.    [provider]  levothyroxine (SYNTHROID) 50 MCG tablet Take 1 tablet (50 mcg total) by mouth daily. 12/26/22   Shamleffer, Konrad Dolores, MD  metFORMIN (GLUCOPHAGE-XR) 500 MG 24 hr tablet Take 2 tablets (1,000 mg total) by mouth daily with breakfast. 12/26/22   Shamleffer, Konrad Dolores, MD  Misc. Devices (TRANSFER BOARD) MISC Use as needed for transfer from bed to chair or vice versa 02/25/21   Lurene Shadow, MD  MYRBETRIQ 25 MG TB24 tablet TAKE 1 TABLET BY MOUTH EVERY DAY 02/02/23   Levin Erp, MD  polyethylene glycol powder (GLYCOLAX/MIRALAX) 17 GM/SCOOP powder Take 17 g by mouth in the morning and at bedtime. Patient taking differently: Take 17 g by mouth 2 (two) times daily as needed for mild constipation. 08/20/21   Levin Erp, MD  SARNA lotion Apply 1 application topically 4 (four) times daily as needed for itching. 05/27/21   [provider]  senna (SENOKOT) 8.6 MG TABS tablet Take 1 tablet (8.6 mg total) by mouth 2 (two) times daily. Patient taking differently: Take 1 tablet by mouth daily as needed for mild constipation. 08/09/21   Alicia Amel, MD  tamsulosin (FLOMAX) 0.4 MG CAPS capsule TAKE 1 CAPSULE BY MOUTH EVERY DAY 03/13/23   Levin Erp, MD                                                                                                                                     Allergies Tramadol and Levemir [insulin detemir]  Review of Systems Review of Systems As noted in HPI  Physical Exam Vital Signs  I have reviewed the triage vital signs BP (!) 174/112   Pulse (!) 128   Temp 99.4 F (37.4 C) (Oral)   Resp (!) 22  SpO2 96%   Physical Exam Vitals reviewed.  Constitutional:      General: She is not in acute distress.    Appearance: She is well-developed. She is not diaphoretic.  HENT:     Head: Normocephalic and atraumatic.     Nose: Nose normal.  Eyes:     General: No scleral icterus.       Right eye: No discharge.        Left eye: No discharge.     Conjunctiva/sclera: Conjunctivae normal.     Pupils: Pupils are equal, round, and reactive to light.  Cardiovascular:     Rate and Rhythm: Regular rhythm. Tachycardia present.     Heart sounds: No murmur heard.    No friction rub. No gallop.  Pulmonary:     Effort: Pulmonary effort is normal. Tachypnea present. No respiratory distress.     Breath sounds: No stridor. Rales present.  Abdominal:     General: There is no distension.     Palpations: Abdomen is soft.     Tenderness: There is no abdominal tenderness.  Musculoskeletal:        General: No tenderness.     Cervical back: Normal range of motion and neck supple.  Skin:    General: Skin is warm and dry.     Findings: No erythema or rash.  Neurological:     Mental Status: She is alert. She is disoriented.     Comments: Oriented to person     ED Results and Treatments Labs (all labs ordered are listed, but only abnormal results are displayed) Labs Reviewed  COMPREHENSIVE METABOLIC PANEL - Abnormal; Notable for the following components:      Result Value   Chloride 97 (*)    Glucose, Bld 228 (*)    Calcium 8.7 (*)    All other components within normal limits  CBC WITH DIFFERENTIAL/PLATELET - Abnormal; Notable for the  following components:   RBC 3.63 (*)    Hemoglobin 11.0 (*)    HCT 34.8 (*)    All other components within normal limits  CULTURE, BLOOD (ROUTINE X 2)  CULTURE, BLOOD (ROUTINE X 2)  URINE CULTURE  RESP PANEL BY RT-PCR (RSV, FLU A&B, COVID)  RVPGX2  PROTIME-INR  APTT  MAGNESIUM  PHOSPHORUS  I-STAT CG4 LACTIC ACID, ED  I-STAT CG4 LACTIC ACID, ED                                                                                                                         EKG  EKG Interpretation Date/Time:  Thursday March 16 2023 04:02:36 EDT Ventricular Rate:  132 PR Interval:  129 QRS Duration:  79 QT Interval:  364 QTC Calculation: 540 R Axis:   -47  Text Interpretation: Sinus tachycardia Left anterior fascicular block Borderline T wave abnormalities Prolonged QT interval Confirmed by Drema Pry [phone]) on 03/16/2023 4:14:07 AM       Radiology DG Chest Port 1 View  Result Date:  03/16/2023 CLINICAL DATA:  Questionable sepsis.  Check lung status. EXAM: PORTABLE CHEST 1 VIEW COMPARISON:  AP lateral chest 03/14/2023 FINDINGS: The heart is moderately enlarged. There is mild central vascular prominence which was seen previously There is no overt edema. The mediastinum is stable, with mild aortic tortuosity, atherosclerosis. The lungs are expiratory. There is increased opacity in the left-greater-than-right lung bases, on the left could be asymmetric hypoventilatory change or pneumonia versus aspiration. On the right appears to be atelectatic bands. The visualized lungs are otherwise clear. No substantial pleural effusion is seen. No new osseous findings. The patient was moderately rotated to the left on this study. IMPRESSION: 1. Expiratory exam with increased opacity in the left-greater-than-right lung bases, on the left could be asymmetric hypoventilatory change or pneumonia versus aspiration. 2. Stable cardiomegaly and mild central vascular prominence without overt edema. 3. Aortic  atherosclerosis. Electronically Signed   By: Almira Bar M.D.   On: 03/16/2023 05:30    Medications Ordered in ED Medications  enoxaparin (LOVENOX) injection 40 mg (has no administration in time range)  potassium chloride SA (KLOR-CON M) CR tablet 20 mEq (has no administration in time range)  acetaminophen (TYLENOL) tablet 650 mg (has no administration in time range)    Or  acetaminophen (TYLENOL) suppository 650 mg (has no administration in time range)  ondansetron (ZOFRAN) tablet 4 mg (has no administration in time range)    Or  ondansetron (ZOFRAN) injection 4 mg (has no administration in time range)  sodium chloride 0.9 % bolus 1,000 mL (1,000 mLs Intravenous New Bag/Given 03/16/23 0442)  acetaminophen (TYLENOL) 650 MG suppository (650 mg Rectal Given 03/16/23 0421)  cefTRIAXone (ROCEPHIN) 1 g in sodium chloride 0.9 % 100 mL IVPB (0 g Intravenous Stopped 03/16/23 0628)   Procedures .Critical Care  Performed by: Nira Conn, MD Authorized by: Nira Conn, MD   Critical care provider statement:    Critical care time (minutes):  45   Critical care time was exclusive of:  Separately billable procedures and treating other patients   Critical care was necessary to treat or prevent imminent or life-threatening deterioration of the following conditions:  Respiratory failure and sepsis   Critical care was time spent personally by me on the following activities:  Development of treatment plan with patient or surrogate, discussions with consultants, evaluation of patient's response to treatment, examination of patient, obtaining history from patient or surrogate, review of old charts, re-evaluation of patient's condition, pulse oximetry, ordering and review of radiographic studies, ordering and review of laboratory studies and ordering and performing treatments and interventions   Care discussed with: admitting provider     (including critical care time) Medical Decision  Making / ED Course   Medical Decision Making Amount and/or Complexity of Data Reviewed Labs: ordered. Decision-making details documented in ED Course. Radiology: ordered and independent interpretation performed. Decision-making details documented in ED Course. ECG/medicine tests: ordered and independent interpretation performed. Decision-making details documented in ED Course.  Risk Prescription drug management. Decision regarding hospitalization.    Patient presents with increased work of breathing and cough. Noted to be febrile, tachycardic, and altered.  Sepsis workup initiated. Patient started on empiric antibiotics given recent UTI though on my review those changes may be related to chronic Foley.  Urine culture sent. More suspicious for COVID versus pneumonia.  Patient started on empiric antibiotics and IV fluids. Tylenol for the effervescing.  CBC without leukocytosis.  Mild anemia. Metabolic panel without significant electrolyte derangements or renal insufficiency.  Hyperglycemia without DKA. INR normal. Lactic acid normal ruling out severe sepsis.  X-ray with questionable pneumonia. Currently still pending COVID/influenza  On my reassessment, patient is heart rate is improved.  She has defervesced after Tylenol.  Blood pressure stable.  Spoke with Dr. Margo Aye from hospitalist service who agreed to admit patient for further workup and management.    Final Clinical Impression(s) / ED Diagnoses Final diagnoses:  Sepsis with encephalopathy without septic shock, due to unspecified organism [facility])  Hypoxia    This chart was dictated using voice recognition software.  Despite best efforts to proofread,  errors can occur which can change the documentation meaning.    Nira Conn, MD 03/16/23 [phone]

## 2023-03-16 NOTE — ED Notes (Signed)
Family updated as to patient's status. Spoke to Mrs. Remi Haggard, pt daughter.

## 2023-03-17 DIAGNOSIS — J9601 Acute respiratory failure with hypoxia: Secondary | ICD-10-CM | POA: Diagnosis not present

## 2023-03-17 LAB — GLUCOSE, CAPILLARY
Glucose-Capillary: 177 mg/dL — ABNORMAL HIGH (ref 70–99)
Glucose-Capillary: 241 mg/dL — ABNORMAL HIGH (ref 70–99)
Glucose-Capillary: 188 mg/dL — ABNORMAL HIGH (ref 70–99)
Glucose-Capillary: 78 mg/dL (ref 70–99)

## 2023-03-17 LAB — PROCALCITONIN: Procalcitonin: 0.75 ng/mL

## 2023-03-17 LAB — CBC: RDW: 13.4 % (ref 11.5–15.5)

## 2023-03-17 MED ORDER — CALCIUM GLUCONATE-NACL 1-0.675 GM/50ML-% IV SOLN
1.0000 g | Freq: Once | INTRAVENOUS | Status: AC
  Administered 2023-03-17: 1000 mg via INTRAVENOUS
  Filled 2023-03-17: qty 50

## 2023-03-17 MED ORDER — POTASSIUM CHLORIDE CRYS ER 20 MEQ PO TBCR
40.0000 meq | EXTENDED_RELEASE_TABLET | Freq: Once | ORAL | Status: AC
  Administered 2023-03-17: 40 meq via ORAL
  Filled 2023-03-17: qty 2

## 2023-03-17 MED ORDER — SODIUM CHLORIDE 0.9 % IV SOLN
3.0000 g | Freq: Four times a day (QID) | INTRAVENOUS | Status: DC
  Administered 2023-03-17 – 2023-03-19 (×8): 3 g via INTRAVENOUS
  Filled 2023-03-17 (×8): qty 8

## 2023-03-17 MED ORDER — BENZONATATE 100 MG PO CAPS
100.0000 mg | ORAL_CAPSULE | Freq: Three times a day (TID) | ORAL | Status: DC
  Administered 2023-03-17 – 2023-03-19 (×7): 100 mg via ORAL
  Filled 2023-03-17 (×7): qty 1

## 2023-03-17 MED ORDER — IPRATROPIUM-ALBUTEROL 0.5-2.5 (3) MG/3ML IN SOLN
3.0000 mL | Freq: Three times a day (TID) | RESPIRATORY_TRACT | Status: DC
  Administered 2023-03-17 – 2023-03-18 (×2): 3 mL via RESPIRATORY_TRACT
  Filled 2023-03-17 (×2): qty 3

## 2023-03-17 NOTE — Progress Notes (Signed)
PROGRESS NOTE  Hayley Fisher  DOB: 1931-07-12  PCP: Levin Erp, MD WUJ:[id number]  DOA: 03/16/2023  LOS: 1 day  Hospital Day: 2  Brief narrative: Hayley Fisher is a 89 y.o. female with PMH significant for DM2, HTN, HLD, CKD, GERD, hypothyroidism, DVT 2022 s/p IVC filter, osteoarthritis, colonic polyps, urge incontinence s/p suprapubic catheter, wheelchair-bound at baseline, taken care of by family at home. 7/23, patient was initially brought to the ED from home by EMS for a syncopal episode, witnessed by family while in the bedside commode.  Patient apparently was constipated for several days and ultimately had a large bowel movement after which she felt clammy, sweaty and passed out for few seconds, no seizure-like activity, woke up normal after that.  Workup was unremarkable patient was discharged home. 7/25, patient presented to the ED with complaint of sore throat, nonproductive cough.  Apparently many members in the family have similar symptoms.  In the ED, patient had a fever of 101.8, heart rate in 130s, blood pressure elevated to 196/119, Per ED triage note, O2 sat 86% on room air requiring 2 L oxygen by nasal cannula Initial labs with WC count normal, hemoglobin 11, magnesium 1.4 Urinalysis with hazy yellow urine, large leukocytes, few bacteria Blood culture and urine culture sent Respiratory virus panel positive for COVID Chest x-ray with increased opacity in the left>right lung bases Patient was started on Paxlovid, IV fluid Admitted to [facility]   Subjective: Patient was seen and examined this afternoon. Propped up in bed.  Not in distress.  Her daughter Hayley Fisher was at bedside. Chart reviewed. Overnight.  No recurrence of fever.  Heart rate improving, blood pressure improving, on 2-3 L supplemental oxygen. Repeat labs this morning showed hemoglobin at 9.1, potassium 3.2  Assessment and plan: COVID pneumonia Suspect bacterial pneumonia Acute respiratory failure with  hypoxia  Presented with nonproductive cough, sore throat, hypoxia.  COVID PCR positive Chest x-ray with increased opacity bilateral lung base L>R Started on Paxlovid Initial O2 sat 86% per triage note Tessalon Perles scheduled for cough. Procalcitonin level mildly elevated raising suspicion of secondary bacterial pneumonia.  Started on IV Unasyn Recent Labs  Lab 03/14/23 1515 03/16/23 0415 03/16/23 0539 03/16/23 0832 03/17/23 0401 03/17/23 0406  SARSCOV2NAA  --   --   --  POSITIVE*  --   --   WBC 4.5 4.7  --   --   --  4.1  LATICACIDVEN  --   --  0.8  --   --   --   PROCALCITON  --   --   --   --  0.75  --   ALT 10 12  --   --   --  11   Type 2 diabetes mellitus A1c 8.5 on 03/16/2023 PTA meds - Lantus 10 units daily, metformin 1000 mg daily Currently continued on Lantus and metformin Continue SSI/Accu-Cheks Recent Labs  Lab 03/16/23 1207 03/16/23 1700 03/16/23 2103 03/17/23 0757 03/17/23 1153  GLUCAP 161* 127* 180* 177* 241*   Hypertension Initially, heart rate was elevated to 130s and blood pressure was elevated to 190s  PTA not on BP meds Was started on metoprolol 12.5 mg twice daily.  HLD PTA meds-aspirin 81 mg daily,  Mild dementia Supportive care Continue doxepin  Hypothyroidism Continue Synthroid  urge incontinence s/p suprapubic catheter Recently completed a course of antibiotic for UTI Continue Flomax, Myrbetriq.   Mild chronic anemia GERD Hemoglobin slightly low Continue iron supplement, Pepcid Recent Labs    10/27/22 1618  03/14/23 1515 03/14/23 1515 03/16/23 0415 03/17/23 0406  HGB 10.9* 9.7*  --  11.0* 9.1*  MCV 92 96.9   < > 95.9 95.4   < > = values in this interval not displayed.   Hypokalemia/hypomagnesemia Replacement given Recent Labs  Lab 03/14/23 1515 03/16/23 0415 03/17/23 0406  K 3.9 3.5 3.2*  MG  --  1.4*  --   PHOS  --  2.9  --    Hypocalcemia Calcium level low at 7.7, albumin slightly low at 2.8. I will give  calcium gluconate replacement   Glaucoma Continue Cosopt, latanoprost and basal to drops. Follow-up with ophthalmology as an outpatient.   Constipation Continue as needed MiraLAX and Senokot.   Mobility: Wheelchair-bound at baseline.  She is only basically able to be pivot. Family does total care at home  Goals of care   Code Status: Full Code     DVT prophylaxis:  enoxaparin (LOVENOX) injection 40 mg Start: 03/16/23 2200   Antimicrobials: IV Unasyn, Paxlovid Fluid: None Consultants: None Family Communication: Daughter Hayley Fisher at bedside  Status: Inpatient Level of care:  Progressive   Patient from: Home Anticipated d/c to: Hopefully home Needs to continue in-hospital care:  IV antibiotics, Paxlovid.  Continue to monitor.  1-2 more days hopefully home after that.     Diet:  Diet Order             Diet heart healthy/carb modified Fluid consistency: Thin  Diet effective 1000                   Scheduled Meds:  aspirin EC  81 mg Oral Daily   benzonatate  100 mg Oral TID   brimonidine  1 drop Left Eye BID   dorzolamide  1 drop Both Eyes BID   And   timolol  1 drop Both Eyes BID   enoxaparin (LOVENOX) injection  40 mg Subcutaneous Q24H   insulin aspart  0-9 Units Subcutaneous TID WC   insulin glargine-yfgn  10 Units Subcutaneous Daily   ipratropium-albuterol  3 mL Nebulization TID   Latanoprostene Bunod  1 drop Both Eyes QHS   levothyroxine  50 mcg Oral QAC breakfast   magnesium oxide  400 mg Oral Daily   metFORMIN  1,000 mg Oral Q breakfast   metoprolol tartrate  12.5 mg Oral BID   mirabegron ER  25 mg Oral Daily   nirmatrelvir/ritonavir  3 tablet Oral BID   potassium chloride  20 mEq Oral Once   tamsulosin  0.4 mg Oral Daily    PRN meds: acetaminophen **OR** acetaminophen, albuterol, camphor-menthol, ondansetron **OR** ondansetron (ZOFRAN) IV, polyethylene glycol, senna   Infusions:   ampicillin-sulbactam (UNASYN) IV       Antimicrobials: Anti-infectives (From admission, onward)    Start     Dose/Rate Route Frequency Ordered Stop   03/17/23 1430  Ampicillin-Sulbactam (UNASYN) 3 g in sodium chloride 0.9 % 100 mL IVPB        3 g 200 mL/hr over 30 Minutes Intravenous Every 6 hours 03/17/23 1334     03/16/23 2200  nirmatrelvir/ritonavir (PAXLOVID) 3 tablet        3 tablet Oral 2 times daily 03/16/23 1514 03/21/23 2159   03/16/23 1330  metroNIDAZOLE (FLAGYL) IVPB 500 mg  Status:  Discontinued        500 mg 100 mL/hr over 60 Minutes Intravenous Every 12 hours 03/16/23 1209 03/17/23 1334   03/16/23 0545  cefTRIAXone (ROCEPHIN) 1 g in sodium chloride 0.9 %  100 mL IVPB        1 g 200 mL/hr over 30 Minutes Intravenous  Once 03/16/23 0530 03/16/23 0628       Nutritional status:  There is no height or weight on file to calculate BMI.          Objective: Vitals:   03/17/23 0843 03/17/23 1202  BP:  (!) 86/53  Pulse:  68  Resp:  18  Temp:  97.8 F (36.6 C)  SpO2: 100% 100%    Intake/Output Summary (Last 24 hours) at 03/17/2023 1336 Last data filed at 03/17/2023 1000 Gross per 24 hour  Intake 340 ml  Output 650 ml  Net -310 ml   There were no vitals filed for this visit. Weight change:  There is no height or weight on file to calculate BMI.   Physical Exam: General exam: Pleasant elderly African-American female.  Not in distress Skin: No rashes, lesions or ulcers. HEENT: Atraumatic, normocephalic, no obvious bleeding Lungs: Diminished air entry both bases.  No wheezing or crackles CVS: Regular rate and rhythm, no murmur GI/Abd soft, nontender, nondistended, bowel sound present.  Suprapubic catheter site intact CNS: Alert, awake, slow to respond but oriented x 3 Psychiatry: Mood appropriate Extremities: No pedal, no calf tenderness  Data Review: I have personally reviewed the laboratory data and studies available.  F/u labs ordered Unresulted Labs (From admission, onward)     Start      Ordered   03/18/23 0500  CBC with Differential/Platelet  Daily,   R     Question:  Specimen collection method  Answer:  Lab=Lab collect   03/17/23 1336   03/18/23 0500  Basic metabolic panel  Daily,   R     Question:  Specimen collection method  Answer:  Lab=Lab collect   03/17/23 1336            Total time spent in review of labs and imaging, patient evaluation, formulation of plan, documentation and communication with family: 55 minutes  Signed, Lorin Glass, MD [facility] 03/17/2023

## 2023-03-17 NOTE — Progress Notes (Signed)
Transition of Care [facility]) - Inpatient Brief Assessment   Patient Details  Name: Hayley Fisher MRN: [id number] Date of Birth: [birth year]  [facility] CM/SW Contact:    Larrie Kass, LCSW Phone Number: 03/17/2023, 2:39 PM   Clinical Narrative:    Transition of Care Asessment: Insurance and Status: Insurance coverage has been reviewed Patient has primary care physician: Yes Home environment has been reviewed: from home with familly , Prior level of function:: bed bound familly provides support Prior/Current Home Services: No current home services Social Determinants of Health Reivew: SDOH reviewed no interventions necessary Readmission risk has been reviewed: Yes Transition of care needs: no transition of care needs at this time

## 2023-03-17 NOTE — Evaluation (Signed)
Physical Therapy Evaluation Patient Details Name: Hayley Fisher MRN: [id number] DOB: [birth year] Today's Date: 03/17/2023  History of Present Illness  89 y.o. female with medical history significant of osteoarthritis, bilateral cataracts, diarrhea, glaucoma, unspecified chronic kidney disease, hyperkalemia, type II DM, hyperlipidemia, colon polyps, hypertension, hypothyroidism, history of metabolic acidosis, history of syncopal episode, urgent continence admitted with nonproductive cough and sore throat.  Dx of covid, PNA.  Clinical Impression  Pt admitted with above diagnosis. Max assist for supine to sit, mod assist to stand and pivot to recliner. Pt reports she's not ambulated in a couple years and that her daughter assists her to transfer to and propel her wheelchair at home. Her daughter also assists with bathing and dressing. BP 82/52 in supine, 124/89 sitting.  Pt currently with functional limitations due to the deficits listed below (see PT Problem List). Pt will benefit from acute skilled PT to increase their independence and safety with mobility to allow discharge.           Assistance Recommended at Discharge Intermittent Supervision/Assistance  If plan is discharge home, recommend the following:  Can travel by private vehicle  A lot of help with walking and/or transfers;A lot of help with bathing/dressing/bathroom;Assistance with cooking/housework;Direct supervision/assist for medications management;Help with stairs or ramp for entrance        Equipment Recommendations None recommended by PT  Recommendations for Other Services       Functional Status Assessment Patient has had a recent decline in their functional status and demonstrates the ability to make significant improvements in function in a reasonable and predictable amount of time.     Precautions / Restrictions Precautions Precautions: Fall Precaution Comments: WC bound, transfers only at  baseline Restrictions Weight Bearing Restrictions: No      Mobility  Bed Mobility Overal bed mobility: Needs Assistance Bed Mobility: Supine to Sit     Supine to sit: Max assist     General bed mobility comments: assist to raise trunk and pivot hips to edge of bed    Transfers Overall transfer level: Needs assistance   Transfers: Sit to/from Stand, Bed to chair/wheelchair/BSC Sit to Stand: Mod assist     Squat pivot transfers: Mod assist     General transfer comment: mod assist to rise and to pivot to recliner with "bear hug" technique    Ambulation/Gait                  Stairs            Wheelchair Mobility     Tilt Bed    Modified Rankin (Stroke Patients Only)       Balance Overall balance assessment: Needs assistance Sitting-balance support: Feet supported, No upper extremity supported Sitting balance-Leahy Scale: Fair                                       Pertinent Vitals/Pain Pain Assessment Pain Assessment: Faces Faces Pain Scale: Hurts little more Pain Location: sore throat Pain Descriptors / Indicators: Sore Pain Intervention(s): Limited activity within patient's tolerance, Monitored during session    Home Living Family/patient expects to be discharged to:: Private residence Living Arrangements: Children Available Help at Discharge: Family;Available 24 hours/day Type of Home: House Home Access: Ramped entrance       Home Layout: One level Home Equipment: BSC/3in1;Wheelchair - Forensic psychologist (2 wheels);Hospital bed;Shower seat      Prior Function  Prior Level of Function : Needs assist             Mobility Comments: assist for transfers into WC, assist to propel WC ADLs Comments: assist for bathing/dressing; pt observed feeding herself with a spoon and a fork     Hand Dominance   Dominant Hand: Right    Extremity/Trunk Assessment   Upper Extremity Assessment Upper Extremity Assessment:  Defer to OT evaluation;LUE deficits/detail;RUE deficits/detail RUE Deficits / Details: fingers in flexed position but able to manipulate a spoon and fork to feed self    Lower Extremity Assessment Lower Extremity Assessment: Generalized weakness;RLE deficits/detail;LLE deficits/detail RLE Deficits / Details: knee ext -25* AROM, knee ext +3/5 RLE Sensation: decreased light touch LLE Deficits / Details: knee ext -25* AROM, knee ext +3/5 LLE Sensation: decreased light touch    Cervical / Trunk Assessment Cervical / Trunk Assessment: Kyphotic  Communication   Communication: No difficulties  Cognition Arousal/Alertness: Awake/alert Behavior During Therapy: WFL for tasks assessed/performed Overall Cognitive Status: Within Functional Limits for tasks assessed                                          General Comments      Exercises     Assessment/Plan    PT Assessment Patient needs continued PT services  PT Problem List Decreased activity tolerance;Decreased mobility       PT Treatment Interventions Functional mobility training;Therapeutic activities;Therapeutic exercise;Patient/family education    PT Goals (Current goals can be found in the Care Plan section)  Acute Rehab PT Goals Patient Stated Goal: to go home PT Goal Formulation: With patient Time For Goal Achievement: 03/31/23 Potential to Achieve Goals: Good    Frequency Min 1X/week     Co-evaluation               AM-PAC PT "6 Clicks" Mobility  Outcome Measure Help needed turning from your back to your side while in a flat bed without using bedrails?: A Lot Help needed moving from lying on your back to sitting on the side of a flat bed without using bedrails?: A Lot Help needed moving to and from a bed to a chair (including a wheelchair)?: Total Help needed standing up from a chair using your arms (e.g., wheelchair or bedside chair)?: Total Help needed to walk in hospital room?: Total Help  needed climbing 3-5 steps with a railing? : Total 6 Click Score: 8    End of Session Equipment Utilized During Treatment: Gait belt Activity Tolerance: Patient tolerated treatment well Patient left: in chair;with call bell/phone within reach Nurse Communication: Mobility status PT Visit Diagnosis: Other abnormalities of gait and mobility (R26.89);Muscle weakness (generalized) (M62.81)    Time: 5638-7564 PT Time Calculation (min) (ACUTE ONLY): 31 min   Charges:   PT Evaluation $PT Eval Moderate Complexity: 1 Mod PT Treatments $Therapeutic Activity: 8-22 mins PT General Charges $$ ACUTE PT VISIT: 1 Visit         Tamala Ser PT 03/17/2023  Acute Rehabilitation Services  Office [phone]

## 2023-03-17 NOTE — Plan of Care (Signed)
  Problem: Coping: Goal: Ability to adjust to condition or change in health will improve Outcome: Progressing   Problem: Health Behavior/Discharge Planning: Goal: Ability to identify and utilize available resources and services will improve Outcome: Progressing   

## 2023-03-18 ENCOUNTER — Encounter (HOSPITAL_COMMUNITY): Payer: Self-pay | Admitting: Internal Medicine

## 2023-03-18 DIAGNOSIS — J9601 Acute respiratory failure with hypoxia: Secondary | ICD-10-CM | POA: Diagnosis not present

## 2023-03-18 LAB — GLUCOSE, CAPILLARY
Glucose-Capillary: 208 mg/dL — ABNORMAL HIGH (ref 70–99)
Glucose-Capillary: 228 mg/dL — ABNORMAL HIGH (ref 70–99)
Glucose-Capillary: 121 mg/dL — ABNORMAL HIGH (ref 70–99)
Glucose-Capillary: 129 mg/dL — ABNORMAL HIGH (ref 70–99)

## 2023-03-18 MED ORDER — IPRATROPIUM-ALBUTEROL 0.5-2.5 (3) MG/3ML IN SOLN
3.0000 mL | Freq: Two times a day (BID) | RESPIRATORY_TRACT | Status: DC
  Administered 2023-03-18: 3 mL via RESPIRATORY_TRACT
  Filled 2023-03-18: qty 3

## 2023-03-18 NOTE — Plan of Care (Signed)
  Problem: Education: Goal: Ability to describe self-care measures that may prevent or decrease complications (Diabetes Survival Skills Education) will improve Outcome: Progressing Goal: Individualized Educational Video(s) Outcome: Progressing   Problem: Coping: Goal: Ability to adjust to condition or change in health will improve Outcome: Progressing   Problem: Fluid Volume: Goal: Ability to maintain a balanced intake and output will improve Outcome: Progressing   Problem: Health Behavior/Discharge Planning: Goal: Ability to identify and utilize available resources and services will improve Outcome: Progressing Goal: Ability to manage health-related needs will improve Outcome: Progressing   Problem: Metabolic: Goal: Ability to maintain appropriate glucose levels will improve Outcome: Progressing   Problem: Nutritional: Goal: Maintenance of adequate nutrition will improve Outcome: Progressing Goal: Progress toward achieving an optimal weight will improve Outcome: Progressing   Problem: Skin Integrity: Goal: Risk for impaired skin integrity will decrease Outcome: Progressing   Problem: Tissue Perfusion: Goal: Adequacy of tissue perfusion will improve Outcome: Progressing   Problem: Education: Goal: Knowledge of General Education information will improve Description: Including pain rating scale, medication(s)/side effects and non-pharmacologic comfort measures Outcome: Progressing   Problem: Health Behavior/Discharge Planning: Goal: Ability to manage health-related needs will improve Outcome: Progressing   Problem: Clinical Measurements: Goal: Ability to maintain clinical measurements within normal limits will improve Outcome: Progressing Goal: Will remain free from infection Outcome: Progressing Goal: Diagnostic test results will improve Outcome: Progressing Goal: Respiratory complications will improve Outcome: Progressing Goal: Cardiovascular complication will  be avoided Outcome: Progressing   Problem: Activity: Goal: Risk for activity intolerance will decrease Outcome: Progressing   Problem: Nutrition: Goal: Adequate nutrition will be maintained Outcome: Progressing   Problem: Coping: Goal: Level of anxiety will decrease Outcome: Progressing   Problem: Elimination: Goal: Will not experience complications related to bowel motility Outcome: Progressing Goal: Will not experience complications related to urinary retention Outcome: Progressing   Problem: Pain Managment: Goal: General experience of comfort will improve Outcome: Progressing   Problem: Safety: Goal: Ability to remain free from injury will improve Outcome: Progressing   Problem: Skin Integrity: Goal: Risk for impaired skin integrity will decrease Outcome: Progressing   Problem: Education: Goal: Knowledge of risk factors and measures for prevention of condition will improve Outcome: Progressing   Problem: Coping: Goal: Psychosocial and spiritual needs will be supported Outcome: Progressing   Problem: Respiratory: Goal: Will maintain a patent airway Outcome: Progressing Goal: Complications related to the disease process, condition or treatment will be avoided or minimized Outcome: Progressing   

## 2023-03-18 NOTE — Progress Notes (Signed)
PROGRESS NOTE  Hayley Fisher  DOB: [birth year]  PCP: Levin Erp, MD BJY:[id number]  DOA: 03/16/2023  LOS: 2 days  Hospital Day: 3  Brief narrative: Hayley Fisher is a 89 y.o. female with PMH significant for DM2, HTN, HLD, CKD, GERD, hypothyroidism, DVT 2022 s/p IVC filter, osteoarthritis, colonic polyps, urge incontinence s/p suprapubic catheter, wheelchair-bound at baseline, taken care of by family at home. 7/23, patient was initially brought to the ED from home by EMS for a syncopal episode, witnessed by family while in the bedside commode.  Patient apparently was constipated for several days and ultimately had a large bowel movement after which she felt clammy, sweaty and passed out for few seconds, no seizure-like activity, woke up normal after that.  Workup was unremarkable patient was discharged home. 7/25, patient presented to the ED with complaint of sore throat, nonproductive cough.  Apparently many members in the family have similar symptoms.  In the ED, patient had a fever of 101.8, heart rate in 130s, blood pressure elevated to 196/119, Per ED triage note, O2 sat 86% on room air requiring 2 L oxygen by nasal cannula Initial labs with WC count normal, hemoglobin 11, magnesium 1.4 Urinalysis with hazy yellow urine, large leukocytes, few bacteria Blood culture and urine culture sent Respiratory virus panel positive for COVID Chest x-ray with increased opacity in the left>right lung bases Patient was started on Paxlovid, IV fluid Admitted to [facility]   Subjective: Patient was seen and examined this afternoon. Propped up in bed.  Not in distress.  Feels better today than yesterday.  Family not at bedside.  Assessment and plan: COVID pneumonia Suspect bacterial pneumonia Acute respiratory failure with hypoxia  Presented with nonproductive cough, sore throat, hypoxia.  COVID PCR positive Chest x-ray showed increased opacity bilateral lung base L>R Started on  Paxlovid Initial O2 sat 86% per triage note Cough improving with Tessalon Perles Procalcitonin level mildly elevated raising suspicion of secondary bacterial pneumonia.  Started on IV Unasyn Recent Labs  Lab 03/14/23 1515 03/16/23 0415 03/16/23 0539 03/16/23 0832 03/17/23 0401 03/17/23 0406 03/18/23 0935  SARSCOV2NAA  --   --   --  POSITIVE*  --   --   --   WBC 4.5 4.7  --   --   --  4.1 2.9*  LATICACIDVEN  --   --  0.8  --   --   --   --   PROCALCITON  --   --   --   --  0.75  --   --   ALT 10 12  --   --   --  11  --    Type 2 diabetes mellitus A1c 8.5 on 03/16/2023 PTA meds - Lantus 10 units daily, metformin 1000 mg daily Currently continued on Lantus and metformin Continue SSI/Accu-Cheks.  Blood sugar level is fluctuant.  Continue to monitor Recent Labs  Lab 03/17/23 1153 03/17/23 1650 03/17/23 2037 03/18/23 0752 03/18/23 1208  GLUCAP 241* 188* 78 208* 228*   Hypertension Initially, heart rate was elevated to 130s and blood pressure was elevated to 190s  PTA not on BP meds Has been started on metoprolol 12.5 mg twice daily.  HLD PTA meds-aspirin 81 mg daily,  Mild dementia Supportive care Continue doxepin  Hypothyroidism Continue Synthroid  urge incontinence s/p suprapubic catheter Recently completed a course of antibiotic for UTI Continue Flomax, Myrbetriq.   Mild chronic anemia GERD Hemoglobin slightly low Continue iron supplement, Pepcid Recent Labs    10/27/22 1618  03/14/23 1515 03/14/23 1515 03/16/23 0415 03/17/23 0406 03/18/23 0935  HGB 10.9* 9.7*  --  11.0* 9.1* 8.9*  MCV 92 96.9   < > 95.9 95.4 95.2   < > = values in this interval not displayed.   Hypokalemia/hypomagnesemia Replacement given Recent Labs  Lab 03/14/23 1515 03/16/23 0415 03/17/23 0406 03/18/23 0935  K 3.9 3.5 3.2* 3.6  MG  --  1.4*  --   --   PHOS  --  2.9  --   --    Hypocalcemia Calcium level low at 7.7, albumin slightly low at 2.8. Calcium gluconate  replacement given   Glaucoma Continue Cosopt, latanoprost and basal to drops. Follow-up with ophthalmology as an outpatient.   Constipation Continue as needed MiraLAX and Senokot.   Mobility: Wheelchair-bound at baseline.  She is only basically able to be pivot. Family does total care at home.  PT eval obtained.  Home with PT recommended  Goals of care   Code Status: Full Code    DVT prophylaxis:  enoxaparin (LOVENOX) injection 40 mg Start: 03/16/23 2200   Antimicrobials: IV Unasyn, Paxlovid Fluid: None Consultants: None Family Communication: Family not at bedside.  Status: Inpatient Level of care:  Progressive   Patient from: Home Anticipated d/c to: Hopefully home Needs to continue in-hospital care:  IV antibiotics, Paxlovid.  If continues to improve, hopefully home tomorrow     Diet:  Diet Order             Diet heart healthy/carb modified Fluid consistency: Thin  Diet effective 1000                   Scheduled Meds:  aspirin EC  81 mg Oral Daily   benzonatate  100 mg Oral TID   brimonidine  1 drop Left Eye BID   dorzolamide  1 drop Both Eyes BID   And   timolol  1 drop Both Eyes BID   enoxaparin (LOVENOX) injection  40 mg Subcutaneous Q24H   insulin aspart  0-9 Units Subcutaneous TID WC   insulin glargine-yfgn  10 Units Subcutaneous Daily   ipratropium-albuterol  3 mL Nebulization BID   Latanoprostene Bunod  1 drop Both Eyes QHS   levothyroxine  50 mcg Oral QAC breakfast   magnesium oxide  400 mg Oral Daily   metFORMIN  1,000 mg Oral Q breakfast   metoprolol tartrate  12.5 mg Oral BID   mirabegron ER  25 mg Oral Daily   nirmatrelvir/ritonavir  3 tablet Oral BID   potassium chloride  20 mEq Oral Once   tamsulosin  0.4 mg Oral Daily    PRN meds: acetaminophen **OR** acetaminophen, albuterol, camphor-menthol, ondansetron **OR** ondansetron (ZOFRAN) IV, polyethylene glycol, senna   Infusions:   ampicillin-sulbactam (UNASYN) IV 3 g (03/18/23  1429)    Antimicrobials: Anti-infectives (From admission, onward)    Start     Dose/Rate Route Frequency Ordered Stop   03/17/23 1430  Ampicillin-Sulbactam (UNASYN) 3 g in sodium chloride 0.9 % 100 mL IVPB        3 g 200 mL/hr over 30 Minutes Intravenous Every 6 hours 03/17/23 1334     03/16/23 2200  nirmatrelvir/ritonavir (PAXLOVID) 3 tablet        3 tablet Oral 2 times daily 03/16/23 1514 03/21/23 2159   03/16/23 1330  metroNIDAZOLE (FLAGYL) IVPB 500 mg  Status:  Discontinued        500 mg 100 mL/hr over 60 Minutes Intravenous Every 12 hours  03/16/23 1209 03/17/23 1334   03/16/23 0545  cefTRIAXone (ROCEPHIN) 1 g in sodium chloride 0.9 % 100 mL IVPB        1 g 200 mL/hr over 30 Minutes Intravenous  Once 03/16/23 0530 03/16/23 0628       Nutritional status:  Body mass index is 25.06 kg/m.          Objective: Vitals:   03/18/23 0853 03/18/23 1156  BP:  114/66  Pulse:  70  Resp:  17  Temp:  98.4 F (36.9 C)  SpO2: 96% 100%    Intake/Output Summary (Last 24 hours) at 03/18/2023 1512 Last data filed at 03/18/2023 1000 Gross per 24 hour  Intake 640 ml  Output 925 ml  Net -285 ml   There were no vitals filed for this visit. Weight change:  Body mass index is 25.06 kg/m.   Physical Exam: General exam: Pleasant elderly African-American female.  Not in distress Skin: No rashes, lesions or ulcers. HEENT: Atraumatic, normocephalic, no obvious bleeding Lungs: Diminished air entry both bases.  No wheezing or crackles CVS: Regular rate and rhythm, no murmur GI/Abd soft, nontender, nondistended, bowel sound present.  Suprapubic catheter site intact CNS: Alert, awake.  Gradually improving alertness and responsiveness Psychiatry: Mood appropriate Extremities: No pedal, no calf tenderness  Data Review: I have personally reviewed the laboratory data and studies available.  F/u labs ordered Unresulted Labs (From admission, onward)     Start     Ordered   03/18/23 0500   CBC with Differential/Platelet  Daily,   R     Question:  Specimen collection method  Answer:  Lab=Lab collect   03/17/23 1336   03/18/23 0500  Basic metabolic panel  Daily,   R     Question:  Specimen collection method  Answer:  Lab=Lab collect   03/17/23 1336            Total time spent in review of labs and imaging, patient evaluation, formulation of plan, documentation and communication with family: 45 minutes  Signed, Lorin Glass, MD [facility] 03/18/2023

## 2023-03-19 DIAGNOSIS — J9601 Acute respiratory failure with hypoxia: Secondary | ICD-10-CM | POA: Diagnosis not present

## 2023-03-19 LAB — CBC WITH DIFFERENTIAL/PLATELET
Abs Immature Granulocytes: 0.02 10*3/uL (ref 0.00–0.07)
Basophils Absolute: 0 10*3/uL (ref 0.0–0.1)
Basophils Relative: 0 %
Eosinophils Absolute: 0.1 10*3/uL (ref 0.0–0.5)
Eosinophils Relative: 2 %
HCT: 28.2 % — ABNORMAL LOW (ref 36.0–46.0)
Hemoglobin: 8.9 g/dL — ABNORMAL LOW (ref 12.0–15.0)
Immature Granulocytes: 1 %
Lymphocytes Relative: 24 %
Lymphs Abs: 0.7 10*3/uL (ref 0.7–4.0)
MCH: 30 pg (ref 26.0–34.0)
MCHC: 31.6 g/dL (ref 30.0–36.0)
MCV: 94.9 fL (ref 80.0–100.0)
Monocytes Absolute: 0.2 10*3/uL (ref 0.1–1.0)
Monocytes Relative: 8 %
Neutro Abs: 1.9 10*3/uL (ref 1.7–7.7)
Neutrophils Relative %: 65 %
Platelets: 182 10*3/uL (ref 150–400)
RBC: 2.97 MIL/uL — ABNORMAL LOW (ref 3.87–5.11)
RDW: 13.5 % (ref 11.5–15.5)
WBC: 2.9 10*3/uL — ABNORMAL LOW (ref 4.0–10.5)
nRBC: 0 % (ref 0.0–0.2)

## 2023-03-19 LAB — BASIC METABOLIC PANEL WITH GFR
Anion gap: 10 (ref 5–15)
BUN: 24 mg/dL — ABNORMAL HIGH (ref 8–23)
CO2: 24 mmol/L (ref 22–32)
Calcium: 7.7 mg/dL — ABNORMAL LOW (ref 8.9–10.3)
Chloride: 100 mmol/L (ref 98–111)
Creatinine, Ser: 0.83 mg/dL (ref 0.44–1.00)
GFR, Estimated: >60 mL/min (ref >60)
Glucose, Bld: 163 mg/dL — ABNORMAL HIGH (ref 70–99)
Potassium: 3.8 mmol/L (ref 3.5–5.1)
Sodium: 134 mmol/L — ABNORMAL LOW (ref 135–145)

## 2023-03-19 LAB — GLUCOSE, CAPILLARY
Glucose-Capillary: 129 mg/dL — ABNORMAL HIGH (ref 70–99)
Glucose-Capillary: 184 mg/dL — ABNORMAL HIGH (ref 70–99)

## 2023-03-19 MED ORDER — METOPROLOL SUCCINATE ER 25 MG PO TB24: 25.0000 mg | ORAL_TABLET | Freq: Every day | ORAL | 0 refills | Status: DC

## 2023-03-19 MED ORDER — NIRMATRELVIR/RITONAVIR (PAXLOVID)TABLET: 3.0000 | ORAL_TABLET | Freq: Two times a day (BID) | ORAL | Status: AC

## 2023-03-19 MED ORDER — AMOXICILLIN-POT CLAVULANATE 875-125 MG PO TABS: 1.0000 | ORAL_TABLET | Freq: Two times a day (BID) | ORAL | 0 refills | Status: AC

## 2023-03-19 MED ORDER — SACCHAROMYCES BOULARDII 250 MG PO CAPS: 250.0000 mg | ORAL_CAPSULE | Freq: Two times a day (BID) | ORAL | 0 refills | Status: AC

## 2023-03-19 NOTE — Discharge Summary (Signed)
Physician Discharge Summary  Hayley Fisher FUX:[id number] DOB: 18-Dec-1930 DOA: 03/16/2023  PCP: Levin Erp, MD  Admit date: 03/16/2023 Discharge date: 03/19/2023  Admitted From: Home Discharge disposition: Home with home health  Recommendations at discharge:  Complete the 5-day total course of Paxlovid at home  Complete the course of antibiotics with 5 more days of oral Augmentin with probiotics You have been started metoprolol for blood pressure and heart rate control.   Brief narrative: Hayley Fisher is a 89 y.o. female with PMH significant for DM2, HTN, HLD, CKD, GERD, hypothyroidism, DVT 2022 s/p IVC filter, osteoarthritis, colonic polyps, urge incontinence s/p suprapubic catheter, wheelchair-bound at baseline, taken care of by family at home. 7/23, patient was initially brought to the ED from home by EMS for a syncopal episode, witnessed by family while in the bedside commode.  Patient apparently was constipated for several days and ultimately had a large bowel movement after which she felt clammy, sweaty and passed out for few seconds, no seizure-like activity, woke up normal after that.  Workup was unremarkable patient was discharged home. 7/25, patient presented to the ED with complaint of sore throat, nonproductive cough.  Apparently many members in the family have similar symptoms.  In the ED, patient had a fever of 101.8, heart rate in 130s, blood pressure elevated to 196/119, Per ED triage note, O2 sat 86% on room air requiring 2 L oxygen by nasal cannula Initial labs with WC count normal, hemoglobin 11, magnesium 1.4 Urinalysis with hazy yellow urine, large leukocytes, few bacteria Blood culture and urine culture sent Respiratory virus panel positive for COVID Chest x-ray with increased opacity in the left>right lung bases Patient was started on Paxlovid, IV fluid Admitted to [facility]   Subjective: Patient was seen and examined this morning.  Propped up in bed.  Feels  better.  Not on supplemental oxygen.  Wants to go home today.    Hospital course: COVID pneumonia Suspect bacterial pneumonia Acute respiratory failure with hypoxia  Presented with nonproductive cough, sore throat, hypoxia.  COVID PCR positive Chest x-ray showed increased opacity bilateral lung base L>R Started on Paxlovid.  To complete 5-day course at home. Initial O2 sat 86% per triage note.  Gradually weaned down O2. Cough improving with Tessalon Perles Procalcitonin level mildly elevated raising suspicion of secondary bacterial pneumonia.  Started on IV Unasyn.  To complete course of antibiotics with 5 more days of oral Augmentin at home with probiotics Recent Labs  Lab 03/14/23 1515 03/16/23 0415 03/16/23 0539 03/16/23 0832 03/17/23 0401 03/17/23 0406 03/18/23 0935 03/19/23 0444  SARSCOV2NAA  --   --   --  POSITIVE*  --   --   --   --   WBC 4.5 4.7  --   --   --  4.1 2.9* 2.9*  LATICACIDVEN  --   --  0.8  --   --   --   --   --   PROCALCITON  --   --   --   --  0.75  --   --   --   ALT 10 12  --   --   --  11  --   --    Type 2 diabetes mellitus A1c 8.5 on 03/16/2023 PTA meds - Lantus 10 units daily, metformin 1000 mg daily Continue same.  Hypertension Initially, heart rate was elevated to 130s and blood pressure was elevated to 190s  PTA not on BP meds Blood pressure was initially elevated but  now improved with metoprolol 12.5 mg twice daily.  Continue metoprolol succinate at home.  HLD PTA meds-aspirin 81 mg daily,  Mild dementia Supportive care Continue doxepin  Hypothyroidism Continue Synthroid  urge incontinence s/p suprapubic catheter Recently completed a course of antibiotic for UTI Continue Flomax, Myrbetriq.   Mild chronic anemia GERD Hemoglobin slightly low Continue iron supplement, Pepcid Recent Labs    03/14/23 1515 03/16/23 0415 03/17/23 0406 03/18/23 0935 03/19/23 0444  HGB 9.7* 11.0* 9.1* 8.9* 8.9*  MCV 96.9 95.9 95.4 95.2 94.9    Hypokalemia/hypomagnesemia Replacement given Recent Labs  Lab 03/14/23 1515 03/16/23 0415 03/17/23 0406 03/18/23 0935 03/19/23 0444  K 3.9 3.5 3.2* 3.6 3.8  MG  --  1.4*  --   --   --   PHOS  --  2.9  --   --   --    Hypocalcemia Calcium level low at 7.7, albumin slightly low at 2.8. Calcium gluconate replacement given   Glaucoma Continue Cosopt, latanoprost and basal to drops. Follow-up with ophthalmology as an outpatient.   Constipation Continue as needed MiraLAX and Senokot.   Mobility: Wheelchair-bound at baseline.  She is only basically able to be pivot. Family does total care at home.  PT eval obtained.  Home with PT recommended  Goals of care   Code Status: Full Code   Wounds:  - Pressure Injury 11/04/21 Buttocks Mid Stage 1 -  Intact skin with non-blanchable redness of a localized area usually over a bony prominence. (Active)  Date First Assessed/Time First Assessed: 11/04/21 1450   Location: Buttocks  Location Orientation: Mid  Staging: Stage 1 -  Intact skin with non-blanchable redness of a localized area usually over a bony prominence.  Present on Admission: Yes    Assessments 11/04/2021  2:50 PM 11/05/2021  8:00 AM  Dressing Type -- Foam - Lift dressing to assess site every shift  Dressing Clean Clean, Dry, Intact  Dressing Change Frequency -- PRN  Site / Wound Assessment -- Clean;Pink  Peri-wound Assessment -- Pink  Drainage Amount -- None  Treatment -- Cleansed     No associated orders.    Discharge Exam:   Vitals:   03/18/23 1730 03/18/23 1953 03/18/23 2037 03/19/23 0430  BP:   125/73 123/73  Pulse:   80 77  Resp:   19 20  Temp:   98.7 F (37.1 C) 98.6 F (37 C)  TempSrc:   Oral Oral  SpO2:  100% 98% 98%  Weight: 62.1 kg     Height:        Body mass index is 25.06 kg/m.   General exam: Pleasant elderly African-American female.  Not in distress Skin: No rashes, lesions or ulcers. HEENT: Atraumatic, normocephalic, no obvious  bleeding Lungs: Diminished air entry both bases.  No wheezing or crackles CVS: Regular rate and rhythm, no murmur GI/Abd soft, nontender, nondistended, bowel sound present.  Suprapubic catheter site intact CNS: Alert, awake.  Gradually improving alertness and responsiveness Psychiatry: Mood appropriate Extremities: No pedal, no calf tenderness  Follow ups:    Follow-up Information     Levin Erp, MD Follow up.   Specialty: Family Medicine Contact information: [address] [place] [address] [phone]                 Discharge Instructions:   Discharge Instructions     Call MD for:  difficulty breathing, headache or visual disturbances   Complete by: As directed    Call MD for:  extreme fatigue   Complete by: As directed    Call MD for:  hives   Complete by: As directed    Call MD for:  persistant dizziness or light-headedness   Complete by: As directed    Call MD for:  persistant nausea and vomiting   Complete by: As directed    Call MD for:  severe uncontrolled pain   Complete by: As directed    Call MD for:  temperature >100.4   Complete by: As directed    Diet general   Complete by: As directed    Discharge instructions   Complete by: As directed    Recommendations at discharge:   Complete the 5-day total course of Paxlovid at home   Complete the course of antibiotics with 5 more days of oral Augmentin with probiotics  You have been started metoprolol for blood pressure and heart rate control.  General discharge instructions: Follow with Primary MD Levin Erp, MD in 7 days  Please request your PCP  to go over your hospital tests, procedures, radiology results at the follow up. Please get your medicines reviewed and adjusted.  Your PCP may decide to repeat certain labs or tests as needed. Do not drive, operate heavy machinery, perform activities at heights, swimming or participation in water activities or provide baby sitting  services if your were admitted for syncope or siezures until you have seen by Primary MD or a Neurologist and advised to do so again. [place] Controlled Substance Reporting System database was reviewed. Do not drive, operate heavy machinery, perform activities at heights, swim, participate in water activities or provide baby-sitting services while on medications for pain, sleep and mood until your outpatient physician has reevaluated you and advised to do so again.  You are strongly recommended to comply with the dose, frequency and duration of prescribed medications. Activity: As tolerated with Full fall precautions use walker/cane & assistance as needed Avoid using any recreational substances like cigarette, tobacco, alcohol, or non-prescribed drug. If you experience worsening of your admission symptoms, develop shortness of breath, life threatening emergency, suicidal or homicidal thoughts you must seek medical attention immediately by calling 911 or calling your MD immediately  if symptoms less severe. You must read complete instructions/literature along with all the possible adverse reactions/side effects for all the medicines you take and that have been prescribed to you. Take any new medicine only after you have completely understood and accepted all the possible adverse reactions/side effects.  Wear Seat belts while driving. You were cared for by a hospitalist during your hospital stay. If you have any questions about your discharge medications or the care you received while you were in the hospital after you are discharged, you can call the unit and ask to speak with the hospitalist or the covering physician. Once you are discharged, your primary care physician will handle any further medical issues. Please note that NO REFILLS for any discharge medications will be authorized once you are discharged, as it is imperative that you return to your primary care physician (or establish a  relationship with a primary care physician if you do not have one).   Increase activity slowly   Complete by: As directed        Discharge Medications:   Allergies as of 03/19/2023       Reactions   Tramadol Itching, Other (See Comments)   Takes occasionally with Benadryl   Levemir [insulin Detemir] Itching  Medication List     STOP taking these medications    cephALEXin 500 MG capsule Commonly known as: KEFLEX       TAKE these medications    Accu-Chek Aviva Plus test strip Generic drug: glucose blood USE TO TEST BLOOD SUGAR UP TO 3 TIMES A DAY. What changed: See the new instructions.   acetaminophen 500 MG tablet Commonly known as: TYLENOL Take 1,000 mg by mouth every 6 (six) hours as needed for moderate pain.   amoxicillin-clavulanate 875-125 MG tablet Commonly known as: AUGMENTIN Take 1 tablet by mouth 2 (two) times daily for 5 days.   aspirin 81 MG chewable tablet Chew 81 mg by mouth daily.   brimonidine 0.2 % ophthalmic solution Commonly known as: ALPHAGAN Place 1 drop into the left eye 2 (two) times daily.   dorzolamide-timolol 2-0.5 % ophthalmic solution Commonly known as: COSOPT Place 1 drop into both eyes 2 (two) times daily.   doxepin 10 MG/ML solution Commonly known as: SINEQUAN Take 1 mL (10 mg total) by mouth at bedtime as needed for sleep.   famotidine 20 MG tablet Commonly known as: PEPCID TAKE 1 TABLET BY MOUTH EVERY DAY   Global Ease Inject Pen Needles 32G X 4 MM Misc Generic drug: Insulin Pen Needle USE 1 SYRINGE DAILY TO INJECT VICTOZA   Insulin Syringe-Needle U-100 31G X 5/16" 0.3 ML Misc Commonly known as: Sure Comfort Insulin Syringe Inject 1 Syringe into the skin daily.   Insulin Syringe-Needle U-100 31G X 5/16" 0.3 ML Misc Commonly known as: TRUEplus Insulin Syringe Use as directed   Iron 325 (65 Fe) MG Tabs TAKE 1 TABLET BY MOUTH DAILY WITH BREAKFAST   Lantus SoloStar 100 UNIT/ML Solostar Pen Generic drug:  insulin glargine Inject 10 Units into the skin daily.   levothyroxine 50 MCG tablet Commonly known as: SYNTHROID Take 1 tablet (50 mcg total) by mouth daily. What changed: when to take this   metFORMIN 500 MG 24 hr tablet Commonly known as: GLUCOPHAGE-XR Take 2 tablets (1,000 mg total) by mouth daily with breakfast.   metoprolol succinate 25 MG 24 hr tablet Commonly known as: Toprol XL Take 1 tablet (25 mg total) by mouth daily.   Myrbetriq 25 MG Tb24 tablet Generic drug: mirabegron ER TAKE 1 TABLET BY MOUTH EVERY DAY   nirmatrelvir/ritonavir 20 x 150 MG & 10 x 100MG  Tabs Commonly known as: PAXLOVID Take 3 tablets by mouth 2 (two) times daily for 5 days. Patient GFR is >60. Take nirmatrelvir (150 mg) two tablets twice daily for 5 days and ritonavir (100 mg) one tablet twice daily for 5 days.   polyethylene glycol powder 17 GM/SCOOP powder Commonly known as: GLYCOLAX/MIRALAX Take 17 g by mouth in the morning and at bedtime. What changed:  when to take this reasons to take this   saccharomyces boulardii 250 MG capsule Commonly known as: FLORASTOR Take 1 capsule (250 mg total) by mouth 2 (two) times daily for 5 days.   Sarna lotion Generic drug: camphor-menthol Apply 1 application  topically 4 (four) times daily as needed for itching (affected areas).   senna 8.6 MG Tabs tablet Commonly known as: SENOKOT Take 1 tablet (8.6 mg total) by mouth 2 (two) times daily. What changed:  when to take this reasons to take this   tamsulosin 0.4 MG Caps capsule Commonly known as: FLOMAX TAKE 1 CAPSULE BY MOUTH EVERY DAY   Transfer Board Misc Use as needed for transfer from bed to chair or vice  versa   Vyzulta 0.024 % Soln Generic drug: Latanoprostene Bunod Place 1 drop into both eyes at bedtime.         The results of significant diagnostics from this hospitalization (including imaging, microbiology, ancillary and laboratory) are listed below for reference.     Procedures and Diagnostic Studies:   DG Chest Port 1 View  Result Date: 03/16/2023 CLINICAL DATA:  Questionable sepsis.  Check lung status. EXAM: PORTABLE CHEST 1 VIEW COMPARISON:  AP lateral chest 03/14/2023 FINDINGS: The heart is moderately enlarged. There is mild central vascular prominence which was seen previously There is no overt edema. The mediastinum is stable, with mild aortic tortuosity, atherosclerosis. The lungs are expiratory. There is increased opacity in the left-greater-than-right lung bases, on the left could be asymmetric hypoventilatory change or pneumonia versus aspiration. On the right appears to be atelectatic bands. The visualized lungs are otherwise clear. No substantial pleural effusion is seen. No new osseous findings. The patient was moderately rotated to the left on this study. IMPRESSION: 1. Expiratory exam with increased opacity in the left-greater-than-right lung bases, on the left could be asymmetric hypoventilatory change or pneumonia versus aspiration. 2. Stable cardiomegaly and mild central vascular prominence without overt edema. 3. Aortic atherosclerosis. Electronically Signed   By: Almira Bar M.D.   On: 03/16/2023 05:30     Labs:   Basic Metabolic Panel: Recent Labs  Lab 03/14/23 1515 03/16/23 0415 03/17/23 0406 03/18/23 0935 03/19/23 0444  NA 135 136 133* 135 134*  K 3.9 3.5 3.2* 3.6 3.8  CL 102 97* 98 100 100  CO2 26 26 22 25 24   GLUCOSE 249* 228* 198* 267* 163*  BUN 19 12 19  30* 24*  CREATININE 0.66 0.75 0.81 0.91 0.83  CALCIUM 8.4* 8.7* 7.7* 7.7* 7.7*  MG  --  1.4*  --   --   --   PHOS  --  2.9  --   --   --    GFR Estimated Creatinine Clearance: 39.8 mL/min (by C-G formula based on SCr of 0.83 mg/dL). Liver Function Tests: Recent Labs  Lab 03/14/23 1515 03/16/23 0415 03/17/23 0406  AST 17 19 25   ALT 10 12 11   ALKPHOS 58 61 39  BILITOT 0.8 0.9 1.1  PROT 6.4* 7.4 5.6*  ALBUMIN 3.6 3.9 2.8*   No results for input(s):  "LIPASE", "AMYLASE" in the last 168 hours. No results for input(s): "AMMONIA" in the last 168 hours. Coagulation profile Recent Labs  Lab 03/16/23 0415  INR 1.1    CBC: Recent Labs  Lab 03/14/23 1515 03/16/23 0415 03/17/23 0406 03/18/23 0935 03/19/23 0444  WBC 4.5 4.7 4.1 2.9* 2.9*  NEUTROABS 3.9 3.3  --  2.1 1.9  HGB 9.7* 11.0* 9.1* 8.9* 8.9*  HCT 31.1* 34.8* 28.8* 27.6* 28.2*  MCV 96.9 95.9 95.4 95.2 94.9  PLT 171 167 137* 161 182   Cardiac Enzymes: No results for input(s): "CKTOTAL", "CKMB", "CKMBINDEX", "TROPONINI" in the last 168 hours. BNP: Invalid input(s): "POCBNP" CBG: Recent Labs  Lab 03/18/23 1208 03/18/23 1738 03/18/23 2035 03/19/23 0753 03/19/23 1152  GLUCAP 228* 121* 129* 129* 184*   D-Dimer No results for input(s): "DDIMER" in the last 72 hours. Hgb A1c No results for input(s): "HGBA1C" in the last 72 hours. Lipid Profile No results for input(s): "CHOL", "HDL", "LDLCALC", "TRIG", "CHOLHDL", "LDLDIRECT" in the last 72 hours. Thyroid function studies No results for input(s): "TSH", "T4TOTAL", "T3FREE", "THYROIDAB" in the last 72 hours.  Invalid input(s): "FREET3" Anemia  work up No results for input(s): "VITAMINB12", "FOLATE", "FERRITIN", "TIBC", "IRON", "RETICCTPCT" in the last 72 hours. Microbiology Recent Results (from the past 240 hour(s))  Urine Culture     Status: Abnormal   Collection Time: 03/14/23  4:58 PM   Specimen: Urine, Suprapubic  Result Value Ref Range Status   Specimen Description   Final    URINE, SUPRAPUBIC Performed at [facility], [address], [place], [place] [address]    Special Requests   Final    NONE Performed at [facility], [address], [place], [place] [address]    Culture MULTIPLE SPECIES PRESENT, SUGGEST RECOLLECTION (A)  Final   Report Status 03/15/2023 FINAL  Final  Blood Culture (routine x 2)     Status: None (Preliminary result)   Collection Time: 03/16/23   4:15 AM   Specimen: BLOOD  Result Value Ref Range Status   Specimen Description   Final    BLOOD RIGHT ANTECUBITAL Performed at [facility], [address] [place], [place] [address]    Special Requests   Final    BOTTLES DRAWN AEROBIC AND ANAEROBIC Blood Culture adequate volume Performed at [facility], [address] [place], [place] [address]    Culture   Final    NO GROWTH 3 DAYS Performed at [facility], [address] [place], [place] [address]    Report Status PENDING  Incomplete  Urine culture     Status: None   Collection Time: 03/16/23  4:24 AM   Specimen: Urine, Suprapubic  Result Value Ref Range Status   Specimen Description   Final    URINE, SUPRAPUBIC Performed at [facility], [address], [place], [place] [address]    Special Requests   Final    NONE Performed at [facility], [address] [place], [place] [address]    Culture   Final    NO GROWTH Performed at [facility], [address] [place], [place] [address]    Report Status 03/17/2023 FINAL  Final  Blood Culture (routine x 2)     Status: None (Preliminary result)   Collection Time: 03/16/23  4:45 AM   Specimen: BLOOD LEFT FOREARM  Result Value Ref Range Status   Specimen Description   Final    BLOOD LEFT FOREARM Performed at [facility], [address] [place], [place] [address]    Special Requests   Final    BOTTLES DRAWN AEROBIC AND ANAEROBIC Blood Culture results may not be optimal due to an inadequate volume of blood received in culture bottles Performed at [facility], [address] [place], [place] [address]    Culture   Final    NO GROWTH 3 DAYS Performed at [facility], [address], [place], [place] [address]    Report Status PENDING  Incomplete  Resp panel by RT-PCR (RSV, Flu A&B, Covid)     Status: Abnormal   Collection Time: 03/16/23  8:32 AM   Result Value Ref Range Status   SARS Coronavirus 2 by RT PCR POSITIVE (A) NEGATIVE Final    Comment: (NOTE) SARS-CoV-2 target nucleic acids are DETECTED.  The SARS-CoV-2 RNA is generally detectable in upper respiratory specimens during the acute phase of infection. Positive results are indicative of the presence of the identified virus, but do not rule out bacterial infection or co-infection with other pathogens not detected by  the test. Clinical correlation with patient history and other diagnostic information is necessary to determine patient infection status. The expected result is Negative.  Fact Sheet for Patients: BloggerCourse.com  Fact Sheet for Healthcare Providers: SeriousBroker.it  This test is not yet approved or cleared by the [place] FDA and  has been authorized for detection and/or diagnosis of SARS-CoV-2 by FDA under an Emergency Use Authorization (EUA).  This EUA will remain in effect (meaning this test can be used) for the duration of  the COVID-19 declaration under Section 564(b)(1) of the A ct, 21 U.S.C. section 360bbb-3(b)(1), unless the authorization is terminated or revoked sooner.     Influenza A by PCR NEGATIVE NEGATIVE Final   Influenza B by PCR NEGATIVE NEGATIVE Final    Comment: (NOTE) The Xpert Xpress SARS-CoV-2/FLU/RSV plus assay is intended as an aid in the diagnosis of influenza from Nasopharyngeal swab specimens and should not be used as a sole basis for treatment. Nasal washings and aspirates are unacceptable for Xpert Xpress SARS-CoV-2/FLU/RSV testing.  Fact Sheet for Patients: BloggerCourse.com  Fact Sheet for Healthcare Providers: SeriousBroker.it  This test is not yet approved or cleared by the [place] FDA and has been authorized for detection and/or diagnosis of SARS-CoV-2 by FDA under an Emergency Use Authorization (EUA).  This EUA will remain in effect (meaning this test can be used) for the duration of the COVID-19 declaration under Section 564(b)(1) of the Act, 21 U.S.C. section 360bbb-3(b)(1), unless the authorization is terminated or revoked.     Resp Syncytial Virus by PCR NEGATIVE NEGATIVE Final    Comment: (NOTE) Fact Sheet for Patients: BloggerCourse.com  Fact Sheet for Healthcare Providers: SeriousBroker.it  This test is not yet approved or cleared by the [place] FDA and has been authorized for detection and/or diagnosis of SARS-CoV-2 by FDA under an Emergency Use Authorization (EUA). This EUA will remain in effect (meaning this test can be used) for the duration of the COVID-19 declaration under Section 564(b)(1) of the Act, 21 U.S.C. section 360bbb-3(b)(1), unless the authorization is terminated or revoked.  Performed at [facility], [address] [place], [place] [address]     Time coordinating discharge: 45 minutes  Signed: Melina Schools Oddis Westling  [facility] 03/19/2023, 12:12 PM

## 2023-03-19 NOTE — Plan of Care (Signed)
  Problem: Education: Goal: Ability to describe self-care measures that may prevent or decrease complications (Diabetes Survival Skills Education) will improve Outcome: Adequate for Discharge Goal: Individualized Educational Video(s) Outcome: Adequate for Discharge   Problem: Coping: Goal: Ability to adjust to condition or change in health will improve Outcome: Adequate for Discharge   Problem: Fluid Volume: Goal: Ability to maintain a balanced intake and output will improve Outcome: Adequate for Discharge   Problem: Health Behavior/Discharge Planning: Goal: Ability to identify and utilize available resources and services will improve Outcome: Adequate for Discharge Goal: Ability to manage health-related needs will improve Outcome: Adequate for Discharge   Problem: Metabolic: Goal: Ability to maintain appropriate glucose levels will improve Outcome: Adequate for Discharge   Problem: Nutritional: Goal: Maintenance of adequate nutrition will improve Outcome: Adequate for Discharge Goal: Progress toward achieving an optimal weight will improve Outcome: Adequate for Discharge   Problem: Skin Integrity: Goal: Risk for impaired skin integrity will decrease Outcome: Adequate for Discharge   Problem: Tissue Perfusion: Goal: Adequacy of tissue perfusion will improve Outcome: Adequate for Discharge   Problem: Education: Goal: Knowledge of General Education information will improve Description: Including pain rating scale, medication(s)/side effects and non-pharmacologic comfort measures Outcome: Adequate for Discharge   Problem: Health Behavior/Discharge Planning: Goal: Ability to manage health-related needs will improve Outcome: Adequate for Discharge   Problem: Clinical Measurements: Goal: Ability to maintain clinical measurements within normal limits will improve Outcome: Adequate for Discharge Goal: Will remain free from infection Outcome: Adequate for Discharge Goal:  Diagnostic test results will improve Outcome: Adequate for Discharge Goal: Respiratory complications will improve Outcome: Adequate for Discharge Goal: Cardiovascular complication will be avoided Outcome: Adequate for Discharge   Problem: Activity: Goal: Risk for activity intolerance will decrease Outcome: Adequate for Discharge   Problem: Nutrition: Goal: Adequate nutrition will be maintained Outcome: Adequate for Discharge   Problem: Coping: Goal: Level of anxiety will decrease Outcome: Adequate for Discharge   Problem: Elimination: Goal: Will not experience complications related to bowel motility Outcome: Adequate for Discharge Goal: Will not experience complications related to urinary retention Outcome: Adequate for Discharge   Problem: Pain Managment: Goal: General experience of comfort will improve Outcome: Adequate for Discharge   Problem: Safety: Goal: Ability to remain free from injury will improve Outcome: Adequate for Discharge   Problem: Skin Integrity: Goal: Risk for impaired skin integrity will decrease Outcome: Adequate for Discharge   Problem: Education: Goal: Knowledge of risk factors and measures for prevention of condition will improve Outcome: Adequate for Discharge   Problem: Coping: Goal: Psychosocial and spiritual needs will be supported Outcome: Adequate for Discharge   Problem: Respiratory: Goal: Will maintain a patent airway Outcome: Adequate for Discharge Goal: Complications related to the disease process, condition or treatment will be avoided or minimized Outcome: Adequate for Discharge

## 2023-03-20 ENCOUNTER — Other Ambulatory Visit: Payer: Self-pay | Admitting: Student

## 2023-03-21 ENCOUNTER — Telehealth: Payer: Self-pay

## 2023-03-21 ENCOUNTER — Encounter: Payer: Self-pay | Admitting: Family Medicine

## 2023-03-21 ENCOUNTER — Ambulatory Visit (INDEPENDENT_AMBULATORY_CARE_PROVIDER_SITE_OTHER): Payer: 59 | Admitting: Family Medicine

## 2023-03-21 VITALS — BP 139/76 | HR 83 | Ht 62.0 in

## 2023-03-21 DIAGNOSIS — U071 COVID-19: Secondary | ICD-10-CM | POA: Diagnosis not present

## 2023-03-21 DIAGNOSIS — J1282 Pneumonia due to coronavirus disease 2019: Secondary | ICD-10-CM

## 2023-03-21 NOTE — Transitions of Care (Post Inpatient/ED Visit) (Signed)
03/21/2023  Name: Hayley Fisher MRN: [id number] DOB: [birth year]  Today's TOC FU Call Status: Today's TOC FU Call Status:: Successful TOC FU Call Competed TOC FU Call Complete Date: 03/21/23  Transition Care Management Follow-up Telephone Call Date of Discharge: 03/19/23 Discharge Facility: [facility]) Type of Discharge: Inpatient Admission Primary Inpatient Discharge Diagnosis:: Sepsis How have you been since you were released from the hospital?: Better (Per daughter Annice Pih) Any questions or concerns?: No  Items Reviewed: Did you receive and understand the discharge instructions provided?: Yes Medications obtained,verified, and reconciled?: Partial Review Completed Reason for Partial Mediation Review: Discussed patient new medications and insulin Any new allergies since your discharge?: No Dietary orders reviewed?: No Do you have support at home?: No  Medications Reviewed Today: Medications Reviewed Today     Reviewed by Jodelle Gross, RN (Case Manager) on 03/21/23 at 1101  Med List Status: <None>   Medication Order Taking? Sig Documenting Provider Last Dose Status Informant  ACCU-CHEK AVIVA PLUS test strip [id number]  USE TO TEST BLOOD SUGAR UP TO 3 TIMES A DAY. Shamleffer, Konrad Dolores, MD  Active   acetaminophen (TYLENOL) 500 MG tablet [id number]  Take 1,000 mg by mouth every 6 (six) hours as needed for moderate pain. [provider]  Active Family Member  amoxicillin-clavulanate (AUGMENTIN) 875-125 MG tablet [id number] Yes Take 1 tablet by mouth 2 (two) times daily for 5 days. Lorin Glass, MD Taking Active   aspirin 81 MG chewable tablet [id number]  Chew 81 mg by mouth daily. [provider]  Active Family Member  brimonidine (ALPHAGAN) 0.2 % ophthalmic solution [id number]  Place 1 drop into the left eye 2 (two) times daily. [provider]  Active Family Member  dorzolamide-timolol (COSOPT) 22.3-6.8 MG/ML ophthalmic solution [id number]   Place 1 drop into both eyes 2 (two) times daily. [provider]  Active Family Member           Med Note Christie Beckers Jul 05, 2021  9:42 PM)    doxepin [facility]) 10 MG/ML solution [id number]  Take 1 mL (10 mg total) by mouth at bedtime as needed for sleep. McDiarmid, Leighton Roach, MD  Active Family Member  famotidine (PEPCID) 20 MG tablet [id number]  TAKE 1 TABLET BY MOUTH EVERY DAY Levin Erp, MD  Active Family Member  FEROSUL 325 (65 Fe) MG tablet [id number]  TAKE 1 TABLET BY MOUTH DAILY WITH Denton Ar, MD  Active   insulin glargine (LANTUS SOLOSTAR) 100 UNIT/ML Solostar Pen [id number] Yes Inject 10 Units into the skin daily. Shamleffer, Konrad Dolores, MD Taking Active Family Member  Insulin Pen Needle (GLOBAL EASE INJECT PEN NEEDLES) 32G X 4 MM MISC [id number] Yes USE 1 SYRINGE DAILY TO INJECT VICTOZA Shamleffer, Konrad Dolores, MD Taking Active   Insulin Syringe-Needle U-100 (SURE COMFORT INSULIN SYRINGE) 31G X 5/16" 0.3 ML MISC [id number]  Inject 1 Syringe into the skin daily. Lurene Shadow, MD  Active   Insulin Syringe-Needle U-100 (TRUEPLUS INSULIN SYRINGE) 31G X 5/16" 0.3 ML MISC [id number]  Use as directed Shamleffer, Konrad Dolores, MD  Active   levothyroxine (SYNTHROID) 50 MCG tablet [id number]  Take 1 tablet (50 mcg total) by mouth daily.  Patient taking differently: Take 50 mcg by mouth daily before breakfast.   Shamleffer, Konrad Dolores, MD  Active Family Member  metFORMIN (GLUCOPHAGE-XR) 500 MG 24 hr tablet [id number]  Take 2 tablets (1,000 mg total) by mouth daily with breakfast. Shamleffer, Konrad Dolores, MD  Active Family  Member  metoprolol succinate (TOPROL XL) 25 MG 24 hr tablet [id number] Yes Take 1 tablet (25 mg total) by mouth daily. Lorin Glass, MD Taking Active   Misc. Devices (TRANSFER BOARD) MISC [id number]  Use as needed for transfer from bed to chair or vice versa Lurene Shadow, MD  Active Family Member  MYRBETRIQ 25 MG TB24 tablet  [id number]  TAKE 1 TABLET BY MOUTH EVERY DAY Levin Erp, MD  Active Family Member  nirmatrelvir/ritonavir (PAXLOVID) 20 x 150 MG & 10 x 100MG  TABS [id number] Yes Take 3 tablets by mouth 2 (two) times daily for 5 days. Patient GFR is >60. Take nirmatrelvir (150 mg) two tablets twice daily for 5 days and ritonavir (100 mg) one tablet twice daily for 5 days. Lorin Glass, MD Taking Active   polyethylene glycol powder (GLYCOLAX/MIRALAX) 17 GM/SCOOP powder [id number]  Take 17 g by mouth in the morning and at bedtime.  Patient taking differently: Take 17 g by mouth 2 (two) times daily as needed for mild constipation.   Levin Erp, MD  Active Family Member  saccharomyces boulardii [facility]) 250 MG capsule [id number] Yes Take 1 capsule (250 mg total) by mouth 2 (two) times daily for 5 days. Lorin Glass, MD Taking Active   SARNA lotion [id number]  Apply 1 application  topically 4 (four) times daily as needed for itching (affected areas). [provider]  Active Family Member  senna (SENOKOT) 8.6 MG TABS tablet [id number]  Take 1 tablet (8.6 mg total) by mouth 2 (two) times daily.  Patient taking differently: Take 1 tablet by mouth daily as needed for mild constipation.   Alicia Amel, MD  Active Family Member  tamsulosin [facility]) 0.4 MG CAPS capsule [id number]  TAKE 1 CAPSULE BY MOUTH EVERY DAY Levin Erp, MD  Active Family Member  VYZULTA 0.024 % SOLN [id number]  Place 1 drop into both eyes at bedtime. [provider]  Active Family Member            Home Care and Equipment/Supplies: Were Home Health Services Ordered?: No Any new equipment or medical supplies ordered?: No  Functional Questionnaire: Do you need assistance with bathing/showering or dressing?: Yes Do you need assistance with meal preparation?: Yes Do you need assistance with eating?: No Do you have difficulty maintaining continence: No Do you need assistance with getting out of bed/getting out of  a chair/moving?: Yes Do you have difficulty managing or taking your medications?: Yes  Follow up appointments reviewed: PCP Follow-up appointment confirmed?: Yes Date of PCP follow-up appointment?: 03/21/23 Follow-up Provider: [facility]. [facility] Follow-up appointment confirmed?: NA Do you need transportation to your follow-up appointment?: No Do you understand care options if your condition(s) worsen?: Yes-patient verbalized understanding  SDOH Interventions Today    Flowsheet Row Most Recent Value  SDOH Interventions   Food Insecurity Interventions Intervention Not Indicated  Transportation Interventions Intervention Not Indicated  Utilities Interventions Intervention Not Indicated     Jodelle Gross, RN, BSN, CCM Care Management Coordinator [facility] Phone: [phone]/Fax: [phone]

## 2023-03-21 NOTE — Patient Instructions (Signed)
Please continue taking the Paxlovid, Augmentin, and metoprolol as prescribed  Please let our office know if you start to have any fevers, shortness of breath, oxygen need, worsening cough, coughing up blood, or vomiting

## 2023-03-21 NOTE — Progress Notes (Signed)
  SUBJECTIVE:   CHIEF COMPLAINT / HPI:   Presents w/ son in law  HFU COVID +/- bacterial pneumonia with acute hypoxemic respiratory failure 7/25-7/28 Per DC Summary: Recommendations at discharge:            Complete the 5-day total course of Paxlovid at home  Complete the course of antibiotics with 5 more days of oral Augmentin with probiotics You have been started metoprolol for blood pressure and heart rate control.  Since leaving hospital: Has been breathing normally - not using any oxygen  Finishing up the paxlovid and augmentin Has been taking metoprolol without issue Hasn't been sleeping too well although this isn't new for her No fevers, illness Eating and drinking without issue Has still been coughing but not as much as before  PERTINENT  PMH / PSH:   Past Medical History:  Diagnosis Date   Arthritis    Cataract    bil cateracts removed   Chronic kidney disease    "spot on one of my kidneys" per pt   Diabetes mellitus    Diarrhea 02/20/2022   Gastroesophageal reflux disease without esophagitis 09/16/2020   Glaucoma    Goals of care, counseling/discussion 09/09/2021   Hyperkalemia 07/02/2020   Hyperlipidemia    Hyperplastic colon polyp    Hypertension    Hypothyroidism 12/09/2020   Leukocytopenia 06/27/2021   Metabolic acidosis 07/02/2020   S/P IVC filter 01/16/2021   DVT 12/2020   Syncope    Thyroid disease    Urge incontinence 05/05/2022    OBJECTIVE:  BP 139/76   Pulse 83   Ht 5\' 2"  (1.575 m)   SpO2 100%   BMI 25.06 kg/m   General: NAD, pleasant, able to participate in exam, wheelchair bound Cardiac: RRR, no murmurs auscultated Respiratory: CTAB, normal WOB Abdomen: soft, non-tender, non-distended, normoactive bowel sounds Extremities: warm and well perfused, 1+ pitting edema BLE around ankles Skin: warm and dry, no rashes noted Neuro: alert, no obvious focal deficits, speech normal Psych: Normal affect and mood  ASSESSMENT/PLAN:   1.  Pneumonia due to COVID-19 virus Hospital follow-up for the same.  Doing well on all of her medications.  Reviewed imaging and labs from her stay, do not feel that we need to recheck any blood work or imaging today.  Discussed return precautions.  Precepted w/ Dr. Deirdre Priest   No orders of the defined types were placed in this encounter.  Return if symptoms worsen or fail to improve.  Vonna Drafts, MD [facility]

## 2023-03-29 ENCOUNTER — Ambulatory Visit: Payer: Self-pay

## 2023-03-29 NOTE — Patient Outreach (Signed)
  Care Coordination   Follow Up Visit Note   03/29/2023 Name: Hayley Fisher MRN: [id number] DOB: Apr 23, 1931  Hayley Fisher is a 89 y.o. year old female who sees Hayley Erp, MD for primary care. I  spoke with Hayley Fisher  What matters to the patients health and wellness today?  Hayley Fisher said she was a unable to talk at the moment and would call me back.   SDOH assessments and interventions completed:  No     Care Coordination Interventions:  No, not indicated   Follow Up Plan: RNCM will remain available for 90 days.  If no further needs are assessed at this time RNCM will be removed from care team.   Encounter Outcome:  Pt. Visit Completed   Hayley Fairly RN, BSN, [facility] Care Coordinator [facility]   Phone: [phone]

## 2023-03-29 NOTE — Patient Instructions (Signed)
Visit Information  Thank you for taking time to visit with me today. Please don't hesitate to contact me if I can be of assistance to you.     If you are experiencing a Mental Health or Behavioral Health Crisis or need someone to talk to, please call 1-800-273-TALK (toll free, 24 hour hotline) Follow Up Plan: RNCM will remain available for 90 days.  If no further needs are assessed at this time RNCM will be removed from care team.   [name] RN, BSN, [facility] Care Coordinator [facility]   Phone: [phone]

## 2023-04-06 ENCOUNTER — Other Ambulatory Visit: Payer: Self-pay | Admitting: Student

## 2023-04-06 DIAGNOSIS — R350 Frequency of micturition: Secondary | ICD-10-CM

## 2023-04-17 ENCOUNTER — Telehealth: Payer: Self-pay | Admitting: Student

## 2023-04-17 DIAGNOSIS — R339 Retention of urine, unspecified: Secondary | ICD-10-CM | POA: Diagnosis not present

## 2023-04-17 NOTE — Telephone Encounter (Signed)
POA dropped off form at front desk for Clearance for Dental.  Verified that patient section of form has been completed.  Last DOS/WCC with PCP was 03/21/23.  Placed form in blue team folder to be completed by clinical staff.  Vilinda Blanks

## 2023-04-18 NOTE — Telephone Encounter (Signed)
Left a form in your box that needs to be completed.

## 2023-04-21 NOTE — Telephone Encounter (Signed)
Form placed up front for pick up.   Form faxed to [facility].   Copy made for scanning.   Daughter aware.

## 2023-05-02 ENCOUNTER — Other Ambulatory Visit: Payer: Self-pay

## 2023-05-02 ENCOUNTER — Emergency Department (HOSPITAL_COMMUNITY): Payer: 59

## 2023-05-02 ENCOUNTER — Emergency Department (HOSPITAL_COMMUNITY)
Admission: EM | Admit: 2023-05-02 | Discharge: 2023-05-02 | Disposition: A | Discharge status: Home / Self Care | Payer: 59

## 2023-05-02 DIAGNOSIS — Z79899 Other long term (current) drug therapy: Secondary | ICD-10-CM | POA: Diagnosis not present

## 2023-05-02 DIAGNOSIS — I10 Essential (primary) hypertension: Secondary | ICD-10-CM | POA: Diagnosis not present

## 2023-05-02 DIAGNOSIS — E119 Type 2 diabetes mellitus without complications: Secondary | ICD-10-CM | POA: Diagnosis not present

## 2023-05-02 DIAGNOSIS — Z794 Long term (current) use of insulin: Secondary | ICD-10-CM | POA: Insufficient documentation

## 2023-05-02 DIAGNOSIS — N281 Cyst of kidney, acquired: Secondary | ICD-10-CM | POA: Diagnosis not present

## 2023-05-02 DIAGNOSIS — Z743 Need for continuous supervision: Secondary | ICD-10-CM | POA: Diagnosis not present

## 2023-05-02 DIAGNOSIS — R109 Unspecified abdominal pain: Secondary | ICD-10-CM | POA: Diagnosis not present

## 2023-05-02 DIAGNOSIS — Z7984 Long term (current) use of oral hypoglycemic drugs: Secondary | ICD-10-CM | POA: Diagnosis not present

## 2023-05-02 DIAGNOSIS — N390 Urinary tract infection, site not specified: Secondary | ICD-10-CM | POA: Insufficient documentation

## 2023-05-02 LAB — URINALYSIS, ROUTINE W REFLEX MICROSCOPIC
Bilirubin Urine: NEGATIVE
Glucose, UA: NEGATIVE mg/dL
Ketones, ur: NEGATIVE mg/dL
Nitrite: NEGATIVE
Protein, ur: 100 mg/dL — AB
RBC / HPF: >50 RBC/hpf (ref 0–5)
Specific Gravity, Urine: 1.019 (ref 1.005–1.030)
WBC, UA: >50 WBC/hpf (ref 0–5)
pH: 6 (ref 5.0–8.0)

## 2023-05-02 LAB — BASIC METABOLIC PANEL
Anion gap: 8 (ref 5–15)
BUN: 24 mg/dL — ABNORMAL HIGH (ref 8–23)
CO2: 26 mmol/L (ref 22–32)
Calcium: 8.6 mg/dL — ABNORMAL LOW (ref 8.9–10.3)
Chloride: 103 mmol/L (ref 98–111)
Creatinine, Ser: 0.8 mg/dL (ref 0.44–1.00)
GFR, Estimated: >60 mL/min (ref >60)
Glucose, Bld: 169 mg/dL — ABNORMAL HIGH (ref 70–99)
Potassium: 4.8 mmol/L (ref 3.5–5.1)
Sodium: 137 mmol/L (ref 135–145)

## 2023-05-02 LAB — CBC WITH DIFFERENTIAL/PLATELET
Abs Immature Granulocytes: 0.01 10*3/uL (ref 0.00–0.07)
Basophils Absolute: 0 10*3/uL (ref 0.0–0.1)
Basophils Relative: 1 %
Eosinophils Absolute: 0.1 10*3/uL (ref 0.0–0.5)
Eosinophils Relative: 3 %
HCT: 34.7 % — ABNORMAL LOW (ref 36.0–46.0)
Hemoglobin: 10.9 g/dL — ABNORMAL LOW (ref 12.0–15.0)
Immature Granulocytes: 0 %
Lymphocytes Relative: 25 %
Lymphs Abs: 0.6 10*3/uL — ABNORMAL LOW (ref 0.7–4.0)
MCH: 30.4 pg (ref 26.0–34.0)
MCHC: 31.4 g/dL (ref 30.0–36.0)
MCV: 96.7 fL (ref 80.0–100.0)
Monocytes Absolute: 0.2 10*3/uL (ref 0.1–1.0)
Monocytes Relative: 9 %
Neutro Abs: 1.6 10*3/uL — ABNORMAL LOW (ref 1.7–7.7)
Neutrophils Relative %: 62 %
Platelets: 177 10*3/uL (ref 150–400)
RBC: 3.59 MIL/uL — ABNORMAL LOW (ref 3.87–5.11)
RDW: 13.5 % (ref 11.5–15.5)
WBC: 2.6 10*3/uL — ABNORMAL LOW (ref 4.0–10.5)
nRBC: 0 % (ref 0.0–0.2)

## 2023-05-02 MED ORDER — CEPHALEXIN 500 MG PO CAPS: 500.0000 mg | ORAL_CAPSULE | Freq: Four times a day (QID) | ORAL | 0 refills | Status: AC

## 2023-05-02 MED ORDER — CEPHALEXIN 500 MG PO CAPS
500.0000 mg | ORAL_CAPSULE | Freq: Once | ORAL | Status: AC
  Administered 2023-05-02: 500 mg via ORAL
  Filled 2023-05-02: qty 1

## 2023-05-02 NOTE — ED Provider Notes (Signed)
[facility] EMERGENCY DEPARTMENT AT [facility] Provider Note   CSN: [id number] Arrival date & time: 05/02/23  1406     History  Chief Complaint  Patient presents with   Urinary catheter problem    Hayley Fisher is a 89 y.o. female.  89 year old female with indwelling suprapubic catheter as well as diabetes and hypertension presenting to the emergency department today with discomfort around her suprapubic catheter with movement.  The patient states that this began this morning.  She states that she has noticed some increased sediment in her urine this morning.  She did report some left-sided flank pain as well and has noticed a small amount of blood in her urine.  She states that the catheter does seem to be draining well.  She denies any associated fevers.  Denies any nausea or vomiting.  She states that this catheter was placed 2 weeks ago.  She came to the emergency department today due to these ongoing symptoms.  The history is provided by the patient.       Home Medications Prior to Admission medications   Medication Sig Start Date End Date Taking? Authorizing Provider  ACCU-CHEK AVIVA PLUS test strip USE TO TEST BLOOD SUGAR UP TO 3 TIMES A DAY. 03/17/23   Shamleffer, Konrad Dolores, MD  acetaminophen (TYLENOL) 500 MG tablet Take 1,000 mg by mouth every 6 (six) hours as needed for moderate pain.    [provider]  aspirin 81 MG chewable tablet Chew 81 mg by mouth daily.    [provider]  brimonidine (ALPHAGAN) 0.2 % ophthalmic solution Place 1 drop into the left eye 2 (two) times daily. 03/30/21   [provider]  dorzolamide-timolol (COSOPT) 22.3-6.8 MG/ML ophthalmic solution Place 1 drop into both eyes 2 (two) times daily. 05/18/20   [provider]  doxepin (SINEQUAN) 10 MG/ML solution Take 1 mL (10 mg total) by mouth at bedtime as needed for sleep. 05/05/22   McDiarmid, Leighton Roach, MD  famotidine (PEPCID) 20 MG tablet TAKE 1 TABLET BY  MOUTH EVERY DAY 12/08/22   Levin Erp, MD  FEROSUL 325 (65 Fe) MG tablet TAKE 1 TABLET BY MOUTH DAILY WITH BREAKFAST 03/20/23   Levin Erp, MD  insulin glargine (LANTUS SOLOSTAR) 100 UNIT/ML Solostar Pen Inject 10 Units into the skin daily. 02/10/23   Shamleffer, Konrad Dolores, MD  Insulin Pen Needle (GLOBAL EASE INJECT PEN NEEDLES) 32G X 4 MM MISC USE 1 SYRINGE DAILY TO INJECT VICTOZA 12/26/22   Shamleffer, Konrad Dolores, MD  Insulin Syringe-Needle U-100 (SURE COMFORT INSULIN SYRINGE) 31G X 5/16" 0.3 ML MISC Inject 1 Syringe into the skin daily. 02/25/21   Lurene Shadow, MD  Insulin Syringe-Needle U-100 (TRUEPLUS INSULIN SYRINGE) 31G X 5/16" 0.3 ML MISC Use as directed 02/24/22   Shamleffer, Konrad Dolores, MD  levothyroxine (SYNTHROID) 50 MCG tablet Take 1 tablet (50 mcg total) by mouth daily. Patient taking differently: Take 50 mcg by mouth daily before breakfast. 12/26/22   Shamleffer, Konrad Dolores, MD  metFORMIN (GLUCOPHAGE-XR) 500 MG 24 hr tablet Take 2 tablets (1,000 mg total) by mouth daily with breakfast. 12/26/22   Shamleffer, Konrad Dolores, MD  metoprolol succinate (TOPROL XL) 25 MG 24 hr tablet Take 1 tablet (25 mg total) by mouth daily. 03/19/23 06/17/23  Lorin Glass, MD  Misc. Devices (TRANSFER BOARD) MISC Use as needed for transfer from bed to chair or vice versa 02/25/21   Lurene Shadow, MD  MYRBETRIQ 25 MG TB24 tablet TAKE 1 TABLET  BY MOUTH EVERY DAY 04/08/23   Levin Erp, MD  polyethylene glycol powder (GLYCOLAX/MIRALAX) 17 GM/SCOOP powder Take 17 g by mouth in the morning and at bedtime. Patient taking differently: Take 17 g by mouth 2 (two) times daily as needed for mild constipation. 08/20/21   Levin Erp, MD  SARNA lotion Apply 1 application  topically 4 (four) times daily as needed for itching (affected areas). 05/27/21   [provider]  senna (SENOKOT) 8.6 MG TABS tablet Take 1 tablet (8.6 mg total) by mouth 2 (two) times daily. Patient taking  differently: Take 1 tablet by mouth daily as needed for mild constipation. 08/09/21   Alicia Amel, MD  tamsulosin (FLOMAX) 0.4 MG CAPS capsule TAKE 1 CAPSULE BY MOUTH EVERY DAY 03/13/23   Levin Erp, MD  VYZULTA 0.024 % SOLN Place 1 drop into both eyes at bedtime.    [provider]      Allergies    Tramadol and Levemir [insulin detemir]    Review of Systems   Review of Systems  Genitourinary:  Positive for flank pain and hematuria.  All other systems reviewed and are negative.   Physical Exam Updated Vital Signs BP (!) 159/93 (BP Location: Right Arm)   Pulse 81   Temp 98 F (36.7 C) (Oral)   Resp 18   Ht 5\' 2"  (1.575 m)   Wt 62 kg   SpO2 99%   BMI 25.00 kg/m  Physical Exam Vitals and nursing note reviewed.   Gen: NAD Eyes: PERRL, EOMI HEENT: no oropharyngeal swelling Neck: trachea midline Resp: clear to auscultation bilaterally Card: RRR, no murmurs, rubs, or gallops Abd: Left-sided CVA tenderness noted, no significant anterior abdominal tenderness noted.  Suprapubic catheter in place with no surrounding erythema.  There does appear to be some erythema under the pannus consistent with fungal infection. Extremities: no calf tenderness, no edema Vascular: 2+ radial pulses bilaterally, 2+ DP pulses bilaterally Skin: no rashes Psyc: acting appropriately   ED Results / Procedures / Treatments   Labs (all labs ordered are listed, but only abnormal results are displayed) Labs Reviewed  CBC WITH DIFFERENTIAL/PLATELET  BASIC METABOLIC PANEL  URINALYSIS, ROUTINE W REFLEX MICROSCOPIC    EKG None  Radiology No results found.  Procedures Procedures    Medications Ordered in ED Medications - No data to display  ED Course/ Medical Decision Making/ A&P Clinical Course as of 05/02/23 1651  Tue May 02, 2023  1645 Signout; suprapupic catheter pain. Minor hematuria; evaluate for stones/infection with UA.. CT scan pending and labs.  [TY]     Clinical Course User Index [TY] Coral Spikes, DO                                 Medical Decision Making 89 year old female with past medical history of diabetes with chronic indwelling suprapubic catheter presenting to the emergency department today with some flank pain as well as some increased urinary sediment and mild hematuria from her suprapubic catheter.  No further evaluate her here with a CT scan to evaluate for ureterolithiasis.  Also tender urinalysis to evaluate for urinary tract infection.  The patient's catheter seems to be draining well.  There is minimal sediment noted.  She does have some irritation under her pannus which does appear consistent with fungal infection this may be causing some of her symptoms as well.  I will reevaluate for ultimate disposition.  The  patient's workup is pending at the time of signout.  Signed out to Dr. Maple Hudson at 1700.  Amount and/or Complexity of Data Reviewed Labs: ordered. Radiology: ordered.           Final Clinical Impression(s) / ED Diagnoses Final diagnoses:  None  Flank pain Dispo: pending  Rx / DC Orders ED Discharge Orders     None         Durwin Glaze, MD 05/02/23 1652

## 2023-05-02 NOTE — ED Provider Notes (Signed)
Received signout this is a 89 year-old with suprapubic catheter pain.  Pending labs and CT scan.  Likely discharge if negative.  When examined, patient in no distress or discomfort. Physical Exam  BP (!) 161/83   Pulse 73   Temp 98 F (36.7 C) (Oral)   Resp 17   Ht 5\' 2"  (1.575 m)   Wt 62 kg   SpO2 100%   BMI 25.00 kg/m   Physical Exam HENT:     Mouth/Throat:     Mouth: Mucous membranes are moist.  Eyes:     Conjunctiva/sclera: Conjunctivae normal.  Cardiovascular:     Rate and Rhythm: Normal rate and regular rhythm.  Pulmonary:     Effort: Pulmonary effort is normal.  Abdominal:     General: Abdomen is flat. There is no distension.     Tenderness: There is no abdominal tenderness. There is no guarding or rebound.     Comments: Suprapubic catheter in place.  No surrounding erythema or drainage.  Skin:    General: Skin is warm.     Capillary Refill: Capillary refill takes less than 2 seconds.  Neurological:     Mental Status: She is alert.     Comments: No gross localizing deficits  Psychiatric:        Mood and Affect: Mood normal.        Behavior: Behavior normal.     Procedures  Procedures  ED Course / MDM   Clinical Course as of 05/02/23 1842  Tue May 02, 2023  1645 Signout; suprapupic catheter pain. Minor hematuria; evaluate for stones/infection with UA.. CT scan pending and labs.  [TY]  1738 CT ABDOMEN PELVIS WO CONTRAST IMPRESSION: 1. No acute findings or explanation for the patient's symptoms. No evidence of urinary tract calculus or hydronephrosis. 2. The suprapubic bladder catheter appears well positioned. The urinary bladder is decompressed with possible mild wall thickening. Correlate with urine analysis. 3. Large amount of stool throughout the colon consistent with constipation. 4. Generalized subcutaneous edema suggesting anasarca. 5. Grossly stable multilevel spondylosis. 6.  Aortic Atherosclerosis (ICD10-I70.0)   [TY]  1738 CBC with  Differential(!) Anemia and leukopenia..  Similar to prior cbc [TY]  1821 Basic metabolic panel(!) With multiple metabolic derangements.  Normal kidney function. [TY]  1821 Urinalysis, Routine w reflex microscopic -Urine, Suprapubic(!) Does have some evidence of UTI.  Will give antibiotics. [TY]  1822 CT ABDOMEN PELVIS WO CONTRAST   [TY]    Clinical Course User Index [TY] Coral Spikes, DO   Medical Decision Making Well-appearing 89-year-old female presenting for suprapubic pain.  Received signout from morning team.  She did note for further HPI.  Patient examined, has soft benign abdomen.  Labs as noted in ED course.  CT scan with some constipation, but appears no issues with Foley catheter.  UA consistent with UTI.  Feel patient is stable for discharge at this time to follow-up with her PCP and neurologist.  Amount and/or Complexity of Data Reviewed Labs: ordered. Decision-making details documented in ED Course. Radiology: ordered. Decision-making details documented in ED Course.         Coral Spikes, DO 05/02/23 1843

## 2023-05-02 NOTE — ED Triage Notes (Signed)
Pt BIB EMS c/o pain at suprabubic catheter site.

## 2023-05-12 ENCOUNTER — Other Ambulatory Visit: Payer: Self-pay | Admitting: Student

## 2023-05-12 DIAGNOSIS — E1140 Type 2 diabetes mellitus with diabetic neuropathy, unspecified: Secondary | ICD-10-CM

## 2023-05-16 DIAGNOSIS — H6123 Impacted cerumen, bilateral: Secondary | ICD-10-CM | POA: Diagnosis not present

## 2023-05-16 DIAGNOSIS — H903 Sensorineural hearing loss, bilateral: Secondary | ICD-10-CM | POA: Diagnosis not present

## 2023-05-17 ENCOUNTER — Other Ambulatory Visit: Payer: Self-pay | Admitting: Student

## 2023-05-17 DIAGNOSIS — E1140 Type 2 diabetes mellitus with diabetic neuropathy, unspecified: Secondary | ICD-10-CM

## 2023-05-18 ENCOUNTER — Other Ambulatory Visit: Payer: Self-pay

## 2023-05-18 DIAGNOSIS — R339 Retention of urine, unspecified: Secondary | ICD-10-CM | POA: Diagnosis not present

## 2023-05-26 DIAGNOSIS — E113313 Type 2 diabetes mellitus with moderate nonproliferative diabetic retinopathy with macular edema, bilateral: Secondary | ICD-10-CM | POA: Diagnosis not present

## 2023-05-31 ENCOUNTER — Emergency Department (HOSPITAL_COMMUNITY)
Admission: EM | Admit: 2023-05-31 | Discharge: 2023-06-01 | Disposition: A | Discharge status: Home / Self Care | Payer: 59 | Attending: Emergency Medicine | Admitting: Emergency Medicine

## 2023-05-31 ENCOUNTER — Telehealth: Payer: Self-pay

## 2023-05-31 ENCOUNTER — Encounter (HOSPITAL_COMMUNITY): Payer: Self-pay

## 2023-05-31 ENCOUNTER — Emergency Department (HOSPITAL_COMMUNITY): Payer: 59

## 2023-05-31 ENCOUNTER — Other Ambulatory Visit: Payer: Self-pay

## 2023-05-31 DIAGNOSIS — Z743 Need for continuous supervision: Secondary | ICD-10-CM | POA: Diagnosis not present

## 2023-05-31 DIAGNOSIS — Z7984 Long term (current) use of oral hypoglycemic drugs: Secondary | ICD-10-CM | POA: Insufficient documentation

## 2023-05-31 DIAGNOSIS — N281 Cyst of kidney, acquired: Secondary | ICD-10-CM | POA: Diagnosis not present

## 2023-05-31 DIAGNOSIS — N390 Urinary tract infection, site not specified: Secondary | ICD-10-CM | POA: Diagnosis not present

## 2023-05-31 DIAGNOSIS — Z794 Long term (current) use of insulin: Secondary | ICD-10-CM | POA: Diagnosis not present

## 2023-05-31 DIAGNOSIS — I10 Essential (primary) hypertension: Secondary | ICD-10-CM | POA: Diagnosis not present

## 2023-05-31 DIAGNOSIS — E119 Type 2 diabetes mellitus without complications: Secondary | ICD-10-CM | POA: Insufficient documentation

## 2023-05-31 DIAGNOSIS — K5900 Constipation, unspecified: Secondary | ICD-10-CM | POA: Diagnosis not present

## 2023-05-31 DIAGNOSIS — Z7982 Long term (current) use of aspirin: Secondary | ICD-10-CM | POA: Insufficient documentation

## 2023-05-31 DIAGNOSIS — T83510A Infection and inflammatory reaction due to cystostomy catheter, initial encounter: Secondary | ICD-10-CM | POA: Diagnosis not present

## 2023-05-31 DIAGNOSIS — R1030 Lower abdominal pain, unspecified: Secondary | ICD-10-CM | POA: Diagnosis present

## 2023-05-31 DIAGNOSIS — F0390 Unspecified dementia without behavioral disturbance: Secondary | ICD-10-CM | POA: Insufficient documentation

## 2023-05-31 DIAGNOSIS — K8689 Other specified diseases of pancreas: Secondary | ICD-10-CM | POA: Diagnosis not present

## 2023-05-31 DIAGNOSIS — R6889 Other general symptoms and signs: Secondary | ICD-10-CM | POA: Diagnosis not present

## 2023-05-31 DIAGNOSIS — K4090 Unilateral inguinal hernia, without obstruction or gangrene, not specified as recurrent: Secondary | ICD-10-CM | POA: Diagnosis not present

## 2023-05-31 LAB — COMPREHENSIVE METABOLIC PANEL
ALT: 11 U/L (ref 0–44)
AST: 19 U/L (ref 15–41)
Albumin: 3.4 g/dL — ABNORMAL LOW (ref 3.5–5.0)
Alkaline Phosphatase: 46 U/L (ref 38–126)
Anion gap: 13 (ref 5–15)
BUN: 27 mg/dL — ABNORMAL HIGH (ref 8–23)
CO2: 24 mmol/L (ref 22–32)
Calcium: 8.5 mg/dL — ABNORMAL LOW (ref 8.9–10.3)
Chloride: 102 mmol/L (ref 98–111)
Creatinine, Ser: 0.78 mg/dL (ref 0.44–1.00)
GFR, Estimated: >60 mL/min (ref >60)
Glucose, Bld: 64 mg/dL — ABNORMAL LOW (ref 70–99)
Potassium: 4.4 mmol/L (ref 3.5–5.1)
Sodium: 139 mmol/L (ref 135–145)
Total Bilirubin: 0.4 mg/dL (ref 0.3–1.2)
Total Protein: 6.1 g/dL — ABNORMAL LOW (ref 6.5–8.1)

## 2023-05-31 LAB — CBC WITH DIFFERENTIAL/PLATELET
Abs Immature Granulocytes: 0.03 10*3/uL (ref 0.00–0.07)
Basophils Absolute: 0 10*3/uL (ref 0.0–0.1)
Basophils Relative: 0 %
Eosinophils Absolute: 0.1 10*3/uL (ref 0.0–0.5)
Eosinophils Relative: 1 %
HCT: 34.1 % — ABNORMAL LOW (ref 36.0–46.0)
Hemoglobin: 10.7 g/dL — ABNORMAL LOW (ref 12.0–15.0)
Immature Granulocytes: 1 %
Lymphocytes Relative: 21 %
Lymphs Abs: 0.9 10*3/uL (ref 0.7–4.0)
MCH: 30.5 pg (ref 26.0–34.0)
MCHC: 31.4 g/dL (ref 30.0–36.0)
MCV: 97.2 fL (ref 80.0–100.0)
Monocytes Absolute: 0.2 10*3/uL (ref 0.1–1.0)
Monocytes Relative: 6 %
Neutro Abs: 2.9 10*3/uL (ref 1.7–7.7)
Neutrophils Relative %: 71 %
Platelets: 198 10*3/uL (ref 150–400)
RBC: 3.51 MIL/uL — ABNORMAL LOW (ref 3.87–5.11)
RDW: 13.3 % (ref 11.5–15.5)
WBC: 4 10*3/uL (ref 4.0–10.5)
nRBC: 0 % (ref 0.0–0.2)

## 2023-05-31 LAB — LIPASE, BLOOD: Lipase: 21 U/L (ref 11–51)

## 2023-05-31 MED ORDER — IOHEXOL 300 MG/ML  SOLN
100.0000 mL | Freq: Once | INTRAMUSCULAR | Status: AC | PRN
  Administered 2023-05-31: 100 mL via INTRAVENOUS

## 2023-05-31 NOTE — Telephone Encounter (Signed)
Transition Care Management Unsuccessful Follow-up Telephone Call  Date of discharge and from where:  [facility] 9/10  Attempts:  1st Attempt  Reason for unsuccessful TCM follow-up call:  No answer/busy   [facility]  [facility], [facility] Guide, Phone: [phone] Website: [email or web address]

## 2023-05-31 NOTE — ED Provider Notes (Signed)
[place] EMERGENCY DEPARTMENT AT [facility] Provider Note   CSN: [id number] Arrival date & time: 05/31/23  1846     History {Add pertinent medical, surgical, social history, OB history to HPI:1} Chief Complaint  Patient presents with   Abdominal Pain    Hayley Fisher is a 89 y.o. female.  Who presents emergency department with a chief complaint of lower abdominal pain.  She has a past medical history of suprapubic catheter constipation, diabetic neuropathy, diabetes, mild dementia.  She is here with her daughter Misty Stanley who is at bedside.  Her daughter Misty Stanley states that earlier she was "hollering in pain."  The patient states that she has lower abdominal pain but when she sits up she has severe pain in her lower abdomen vagina and rectum.  This is new.  She has had normal urinary output from her suprapubic catheter.  She denies any constipation   Abdominal Pain      Home Medications Prior to Admission medications   Medication Sig Start Date End Date Taking? Authorizing Provider  ACCU-CHEK AVIVA PLUS test strip USE TO TEST BLOOD SUGAR UP TO 3 TIMES A DAY. 03/17/23   Shamleffer, Konrad Dolores, MD  acetaminophen (TYLENOL) 500 MG tablet Take 1,000 mg by mouth every 6 (six) hours as needed for moderate pain.    [provider]  aspirin 81 MG chewable tablet Chew 81 mg by mouth daily.    [provider]  brimonidine (ALPHAGAN) 0.2 % ophthalmic solution Place 1 drop into the left eye 2 (two) times daily. 03/30/21   [provider]  dorzolamide-timolol (COSOPT) 22.3-6.8 MG/ML ophthalmic solution Place 1 drop into both eyes 2 (two) times daily. 05/18/20   [provider]  doxepin (SINEQUAN) 10 MG/ML solution Take 1 mL (10 mg total) by mouth at bedtime as needed for sleep. 05/05/22   McDiarmid, Leighton Roach, MD  famotidine (PEPCID) 20 MG tablet TAKE 1 TABLET BY MOUTH EVERY DAY 12/08/22   Levin Erp, MD  FEROSUL 325 (65 Fe) MG tablet TAKE 1 TABLET BY  MOUTH DAILY WITH BREAKFAST 03/20/23   Levin Erp, MD  insulin glargine (LANTUS SOLOSTAR) 100 UNIT/ML Solostar Pen Inject 10 Units into the skin daily. 02/10/23   Shamleffer, Konrad Dolores, MD  Insulin Pen Needle (GLOBAL EASE INJECT PEN NEEDLES) 32G X 4 MM MISC USE 1 SYRINGE DAILY TO INJECT VICTOZA 12/26/22   Shamleffer, Konrad Dolores, MD  Insulin Syringe-Needle U-100 (SURE COMFORT INSULIN SYRINGE) 31G X 5/16" 0.3 ML MISC Inject 1 Syringe into the skin daily. 02/25/21   Lurene Shadow, MD  Insulin Syringe-Needle U-100 (TRUEPLUS INSULIN SYRINGE) 31G X 5/16" 0.3 ML MISC Use as directed 02/24/22   Shamleffer, Konrad Dolores, MD  levothyroxine (SYNTHROID) 50 MCG tablet Take 1 tablet (50 mcg total) by mouth daily. Patient taking differently: Take 50 mcg by mouth daily before breakfast. 12/26/22   Shamleffer, Konrad Dolores, MD  metFORMIN (GLUCOPHAGE-XR) 500 MG 24 hr tablet Take 2 tablets (1,000 mg total) by mouth daily with breakfast. 12/26/22   Shamleffer, Konrad Dolores, MD  metoprolol succinate (TOPROL XL) 25 MG 24 hr tablet Take 1 tablet (25 mg total) by mouth daily. 03/19/23 06/17/23  Lorin Glass, MD  Misc. Devices (TRANSFER BOARD) MISC Use as needed for transfer from bed to chair or vice versa 02/25/21   Lurene Shadow, MD  MYRBETRIQ 25 MG TB24 tablet TAKE 1 TABLET BY MOUTH EVERY DAY 04/08/23   Levin Erp, MD  polyethylene glycol powder (GLYCOLAX/MIRALAX) 17 GM/SCOOP powder  Take 17 g by mouth in the morning and at bedtime. Patient taking differently: Take 17 g by mouth 2 (two) times daily as needed for mild constipation. 08/20/21   Levin Erp, MD  SARNA lotion Apply 1 application  topically 4 (four) times daily as needed for itching (affected areas). 05/27/21   [provider]  senna (SENOKOT) 8.6 MG TABS tablet Take 1 tablet (8.6 mg total) by mouth 2 (two) times daily. Patient taking differently: Take 1 tablet by mouth daily as needed for mild constipation. 08/09/21   Alicia Amel, MD  tamsulosin (FLOMAX) 0.4 MG CAPS capsule TAKE 1 CAPSULE BY MOUTH EVERY DAY 05/12/23   Levin Erp, MD  VYZULTA 0.024 % SOLN Place 1 drop into both eyes at bedtime.    [provider]      Allergies    Tramadol and Levemir [insulin detemir]    Review of Systems   Review of Systems  Gastrointestinal:  Positive for abdominal pain.    Physical Exam Updated Vital Signs BP 134/74 (BP Location: Left Arm)   Pulse 78   Temp 97.7 F (36.5 C) (Oral)   Ht 5\' 2"  (1.575 m)   Wt 62 kg   SpO2 98%   BMI 25.00 kg/m  Physical Exam Vitals and nursing note reviewed.  Constitutional:      General: She is not in acute distress.    Appearance: She is well-developed. She is not diaphoretic.  HENT:     Head: Normocephalic and atraumatic.     Right Ear: External ear normal.     Left Ear: External ear normal.     Nose: Nose normal.     Mouth/Throat:     Mouth: Mucous membranes are moist.  Eyes:     General: No scleral icterus.    Conjunctiva/sclera: Conjunctivae normal.  Cardiovascular:     Rate and Rhythm: Normal rate and regular rhythm.     Heart sounds: Normal heart sounds. No murmur heard.    No friction rub. No gallop.  Pulmonary:     Effort: Pulmonary effort is normal. No respiratory distress.     Breath sounds: Normal breath sounds.  Abdominal:     General: Bowel sounds are normal. There is no distension.     Palpations: Abdomen is soft. There is no mass.     Tenderness: There is no abdominal tenderness. There is no right CVA tenderness, left CVA tenderness or guarding.  Musculoskeletal:     Cervical back: Normal range of motion.  Skin:    General: Skin is warm and dry.  Neurological:     Mental Status: She is alert and oriented to person, place, and time.  Psychiatric:        Behavior: Behavior normal.     ED Results / Procedures / Treatments   Labs (all labs ordered are listed, but only abnormal results are displayed) Labs Reviewed  CBC WITH  DIFFERENTIAL/PLATELET  COMPREHENSIVE METABOLIC PANEL  LIPASE, BLOOD  I-STAT CHEM 8, ED    EKG None  Radiology No results found.  Procedures Procedures  {Document cardiac monitor, telemetry assessment procedure when appropriate:1}  Medications Ordered in ED Medications - No data to display  ED Course/ Medical Decision Making/ A&P   {   Click here for ABCD2, HEART and other calculatorsREFRESH Note before signing :1}  Medical Decision Making Amount and/or Complexity of Data Reviewed Labs: ordered. Radiology: ordered.   ***  {Document critical care time when appropriate:1} {Document review of labs and clinical decision tools ie heart score, Chads2Vasc2 etc:1}  {Document your independent review of radiology images, and any outside records:1} {Document your discussion with family members, caretakers, and with consultants:1} {Document social determinants of health affecting pt's care:1} {Document your decision making why or why not admission, treatments were needed:1} Final Clinical Impression(s) / ED Diagnoses Final diagnoses:  None    Rx / DC Orders ED Discharge Orders     None

## 2023-05-31 NOTE — ED Triage Notes (Signed)
BIB EMS from home for vaginal pain that went to anus and then abdominal area. Pt has suprapubic catheter. Pt states she has no pain on arrival.

## 2023-06-01 ENCOUNTER — Other Ambulatory Visit: Payer: Self-pay | Admitting: Student

## 2023-06-01 ENCOUNTER — Telehealth: Payer: Self-pay

## 2023-06-01 DIAGNOSIS — K5900 Constipation, unspecified: Secondary | ICD-10-CM | POA: Diagnosis not present

## 2023-06-01 DIAGNOSIS — K219 Gastro-esophageal reflux disease without esophagitis: Secondary | ICD-10-CM

## 2023-06-01 LAB — URINALYSIS, ROUTINE W REFLEX MICROSCOPIC
Bilirubin Urine: NEGATIVE
Glucose, UA: NEGATIVE mg/dL
Ketones, ur: NEGATIVE mg/dL
Nitrite: NEGATIVE
Protein, ur: NEGATIVE mg/dL
Specific Gravity, Urine: 1.019 (ref 1.005–1.030)
WBC, UA: >50 WBC/hpf (ref 0–5)
pH: 6 (ref 5.0–8.0)

## 2023-06-01 LAB — CBG MONITORING, ED
Glucose-Capillary: 39 mg/dL — CL (ref 70–99)
Glucose-Capillary: 41 mg/dL — CL (ref 70–99)
Glucose-Capillary: 47 mg/dL — ABNORMAL LOW (ref 70–99)
Glucose-Capillary: 67 mg/dL — ABNORMAL LOW (ref 70–99)
Glucose-Capillary: 94 mg/dL (ref 70–99)

## 2023-06-01 LAB — I-STAT CHEM 8, ED
BUN: 24 mg/dL — ABNORMAL HIGH (ref 8–23)
Calcium, Ion: 1.1 mmol/L — ABNORMAL LOW (ref 1.15–1.40)
Chloride: 101 mmol/L (ref 98–111)
Creatinine, Ser: 0.9 mg/dL (ref 0.44–1.00)
Glucose, Bld: 54 mg/dL — ABNORMAL LOW (ref 70–99)
HCT: 32 % — ABNORMAL LOW (ref 36.0–46.0)
Hemoglobin: 10.9 g/dL — ABNORMAL LOW (ref 12.0–15.0)
Potassium: 4.5 mmol/L (ref 3.5–5.1)
Sodium: 138 mmol/L (ref 135–145)
TCO2: 24 mmol/L (ref 22–32)

## 2023-06-01 MED ORDER — DEXTROSE 50 % IV SOLN: 1.0000 | Freq: Once | INTRAVENOUS | Status: DC

## 2023-06-01 MED ORDER — FLEET ENEMA RE ENEM
1.0000 | ENEMA | Freq: Once | RECTAL | Status: AC
  Administered 2023-06-01: 1 via RECTAL
  Filled 2023-06-01: qty 1

## 2023-06-01 MED ORDER — CEFPODOXIME PROXETIL 200 MG PO TABS: 200.0000 mg | ORAL_TABLET | Freq: Two times a day (BID) | ORAL | 0 refills | Status: AC

## 2023-06-01 MED ORDER — GLYCERIN (LAXATIVE) 2 G RE SUPP
1.0000 | Freq: Once | RECTAL | Status: AC
  Administered 2023-06-01: 1 via RECTAL
  Filled 2023-06-01: qty 1

## 2023-06-01 MED ORDER — DEXTROSE 10 % IV SOLN: 100.0000 mL | Freq: Once | INTRAVENOUS | Status: DC

## 2023-06-01 NOTE — ED Provider Notes (Signed)
  Physical Exam  BP (!) 153/62 (BP Location: Right Arm)   Pulse 76   Temp 98.4 F (36.9 C) (Oral)   Resp 14   Ht 5\' 2"  (1.575 m)   Wt 62 kg   SpO2 97%   BMI 25.00 kg/m   Physical Exam Vitals and nursing note reviewed.  Constitutional:      General: She is not in acute distress.    Appearance: She is not toxic-appearing.  Cardiovascular:     Rate and Rhythm: Normal rate.  Pulmonary:     Effort: Pulmonary effort is normal. No respiratory distress.  Abdominal:     Palpations: Abdomen is soft.     Tenderness: There is no abdominal tenderness.  Skin:    General: Skin is warm and dry.  Neurological:     Mental Status: She is alert.     Procedures  Procedures  ED Course / MDM    Medical Decision Making Amount and/or Complexity of Data Reviewed Labs: ordered. Radiology: ordered.  Risk OTC drugs. Prescription drug management.   Accepted handoff at shift change from [facility]. Please see prior provider note for more detail.   Briefly: Patient is 89 y.o. female presenting for lower abdominal pain.  CT shows that she has been constipated.  Has had large vomiting here and is only complaining about pain.  Currently waiting on urinalysis.  DDX: concern for possible UTI given CT reading  Plan: follow up on urinalysis.    Urinalysis shows straw-colored urine with mod amount of hemoglobin present.  There is large amount of leukocytes with greater than 50 white blood cells with rare bacteria.  Urine culture pending.  No leukocytosis with a CBC.  Kidney function within normal limits.  On my evaluation, the patient reports that she is feeling much better and that she does not have any more pain. She is resting comfortably on the stretcher in no acute distress. Belly is soft and reportedly non tender. She has had large BM here. Currently has suprapubic catheter, so unsure of dysuria.   From chart evaluation, it does appear that she grows out Klebsiella and E. Coli.  Question she  may be colonized with Klebsiella.  I spoke with Harrold Donath, [facility] about treatment.  Recommend cefpodoxime for 7 days given the patient does not have any leukocytosis or septic presentation.  Again, unsure of the patient's have any dysuria given that she has a catheter and also has a mild dementia.  I have added on a urine culture as well.  Spoke with daughter, Annice Pih on the phone. Encourage hydration and do two scoops or Miralax in the morning instead of her usual one scoop. I discussed with her the findings of the UTI and that we would be sending her home with an antibiotic and for her to answer the phone if she gets a phone call for the urine culture. Patient stable for discharge home with outpatient follow up.    Achille Rich, PA-C 06/01/23 1036    Laurence Spates, MD 06/03/23 [phone]

## 2023-06-01 NOTE — Discharge Instructions (Signed)
You were seen in the ER today for evaluation of your lower abdominal pain. It was discovered that you were constipated. I am glad that you are feeling better. Please make sure to increase your Miralax to 2 scoops daily from 1. Please make sure that you are staying well hydrated. Your urine does show signs of infection. I am going to place you on an antibiotic for you to take. Please take to completion. I have also cultured your urine, so please make sure to answer your phone in the upcoming days for results. If you have any fever, belly pain, black or bloody poop, or decreased urine output, please return to the nearest ER for re-evaluation. If you have any concerns, new or worsening symptoms, please return to the nearest ER for re-evaluation.   Contact a health care provider if: You have pain that gets worse. You have a fever. You do not have a bowel movement after 4 days. You vomit. You are not hungry or you lose weight. You are bleeding from the opening between the buttocks (anus). You have thin, pencil-like stools. Get help right away if: You have a fever and your symptoms suddenly get worse. You leak stool or have blood in your stool. Your abdomen is bloated. You have severe pain in your abdomen. You feel dizzy or you faint.

## 2023-06-01 NOTE — ED Notes (Signed)
Pt was offered orange juice and was able to drink a 1 1/2 cups without incident. Pt alert and able to follow commands at this time.

## 2023-06-01 NOTE — Telephone Encounter (Signed)
Transition Care Management Follow-up Telephone Call Date of discharge and from where: [facility]/10 How have you been since you were released from the hospital? Doing ok and has followed up with providers.  Any questions or concerns? No  Items Reviewed: Did the pt receive and understand the discharge instructions provided? Yes  Medications obtained and verified? Yes  Other? No  Any new allergies since your discharge? No  Dietary orders reviewed? No Do you have support at home? Yes     Follow up appointments reviewed:  PCP Hospital f/u appt confirmed? Yes  Scheduled to see  on  @ . Specialist Hospital f/u appt confirmed? Yes  Scheduled to see  on  @ . Are transportation arrangements needed? No  If their condition worsens, is the pt aware to call PCP or go to the Emergency Dept.? Yes Was the patient provided with contact information for the PCP's office or ED? Yes Was to pt encouraged to call back with questions or concerns? Yes

## 2023-06-01 NOTE — ED Provider Notes (Signed)
Patient care resumed at shift change from [facility].  For full HPI, please refer to previous provider's note.  Plan is to follow-up with CT abdomen pelvis.  Physical Exam  BP 127/74 (BP Location: Right Arm)   Pulse 77   Temp 98.2 F (36.8 C) (Oral)   Resp 12   Ht 5\' 2"  (1.575 m)   Wt 62 kg   SpO2 99%   BMI 25.00 kg/m   Physical Exam Vitals and nursing note reviewed.  Constitutional:      Appearance: Normal appearance.  HENT:     Head: Normocephalic and atraumatic.     Mouth/Throat:     Mouth: Mucous membranes are moist.  Eyes:     General: No scleral icterus. Cardiovascular:     Rate and Rhythm: Normal rate and regular rhythm.     Pulses: Normal pulses.     Heart sounds: Normal heart sounds.  Pulmonary:     Effort: Pulmonary effort is normal.     Breath sounds: Normal breath sounds.  Abdominal:     General: Abdomen is flat.     Palpations: Abdomen is soft.     Tenderness: There is no abdominal tenderness.     Comments: Tenderness to palpation to suprapubic area.  Musculoskeletal:        General: No deformity.  Skin:    General: Skin is warm.     Findings: No rash.  Neurological:     General: No focal deficit present.     Mental Status: She is alert.  Psychiatric:        Mood and Affect: Mood normal.     Procedures  Procedures  ED Course / MDM    Medical Decision Making Amount and/or Complexity of Data Reviewed Labs: ordered. Radiology: ordered.  Risk OTC drugs. Prescription drug management.   89 year old female presents with a chief complaint of abdominal pain.  On physical examination, there was tenderness to palpation to the suprapubic area.  CT scan showed constipation and urinary bladder wall thickening.  CBC with no leukocytosis.  Hemoglobin today is 10.7 which is around baseline.  CMP with no evidence of acute electrolyte abnormalities or AKI.  UA ordered and pending.  Patient's glucose dropped to the 40s, 50s.  She was able to tolerate  orange juice.  Blood glucose at recheck was 67.  Given glycerin suppository and soap enema.  Patient had a bowel movement after these medications/procedure.   Disposition Patient care signed out at shift change to [facility], PA-C with pending UA.       Jeanelle Malling, PA 06/01/23 6213    Ernie Avena, MD 06/01/23 [phone]

## 2023-06-03 LAB — URINE CULTURE: Culture: >=100000 — AB

## 2023-06-04 ENCOUNTER — Telehealth (HOSPITAL_BASED_OUTPATIENT_CLINIC_OR_DEPARTMENT_OTHER): Payer: Self-pay | Admitting: *Deleted

## 2023-06-04 NOTE — Telephone Encounter (Signed)
Post ED Visit - Positive Culture Follow-up: Unsuccessful Patient Follow-up  Culture assessed and recommendations reviewed by:  [x]  Cherylin Mylar, Pharm.D. []  Celedonio Miyamoto, Pharm.D., BCPS AQ-ID []  Garvin Fila, Pharm.D., BCPS []  Georgina Pillion, Pharm.D., BCPS []  [place], [place].D., BCPS, AAHIVP []  Estella Husk, Pharm.D., BCPS, AAHIVP []  Sherlynn Carbon, PharmD []  Pollyann Samples, PharmD, BCPS  Positive urine culture  []  Patient discharged without antimicrobial prescription and treatment is now indicated [x]  Organism is resistant to prescribed ED discharge antimicrobial []  Patient with positive blood cultures  Plan:  Stop Cefpodoxime Proxetil Start: Cipro 250mg  BID x 3 days  Unable to contact patient, letter will be sent to address on file  Patsey Berthold 06/04/2023, 4:20 PM

## 2023-06-08 ENCOUNTER — Other Ambulatory Visit: Payer: Self-pay | Admitting: Student

## 2023-06-08 DIAGNOSIS — R350 Frequency of micturition: Secondary | ICD-10-CM

## 2023-06-12 ENCOUNTER — Other Ambulatory Visit: Payer: Self-pay | Admitting: Student

## 2023-06-13 ENCOUNTER — Other Ambulatory Visit: Payer: Self-pay

## 2023-06-15 ENCOUNTER — Other Ambulatory Visit: Payer: Self-pay | Admitting: Student

## 2023-06-15 DIAGNOSIS — E1140 Type 2 diabetes mellitus with diabetic neuropathy, unspecified: Secondary | ICD-10-CM

## 2023-06-15 DIAGNOSIS — R339 Retention of urine, unspecified: Secondary | ICD-10-CM | POA: Diagnosis not present

## 2023-06-15 MED ORDER — ACCU-CHEK SOFTCLIX LANCETS MISC: 12 refills | Status: DC

## 2023-06-15 NOTE — Addendum Note (Signed)
Addended by: Levin Erp on: 06/15/2023 04:28 PM   Modules accepted: Orders

## 2023-06-15 NOTE — Telephone Encounter (Signed)
Received call from pharmacy requesting refills on Accu-chek lancets.   Refill request was previously denied. Will forward to PCP for further advisement.   Veronda Prude, RN

## 2023-06-26 ENCOUNTER — Ambulatory Visit
Admission: RE | Admit: 2023-06-26 | Discharge: 2023-06-26 | Disposition: A | Discharge status: Home / Self Care | Payer: 59 | Source: Ambulatory Visit | Attending: Family Medicine | Admitting: Family Medicine

## 2023-06-26 ENCOUNTER — Ambulatory Visit (INDEPENDENT_AMBULATORY_CARE_PROVIDER_SITE_OTHER): Payer: 59 | Admitting: Student

## 2023-06-26 ENCOUNTER — Encounter: Payer: Self-pay | Admitting: Student

## 2023-06-26 VITALS — BP 138/76 | HR 94

## 2023-06-26 DIAGNOSIS — R059 Cough, unspecified: Secondary | ICD-10-CM | POA: Diagnosis not present

## 2023-06-26 DIAGNOSIS — R051 Acute cough: Secondary | ICD-10-CM | POA: Diagnosis not present

## 2023-06-26 DIAGNOSIS — Z23 Encounter for immunization: Secondary | ICD-10-CM

## 2023-06-26 LAB — POC SOFIA 2 FLU + SARS ANTIGEN FIA
Influenza A, POC: NEGATIVE
Influenza B, POC: NEGATIVE
SARS Coronavirus 2 Ag: NEGATIVE

## 2023-06-26 NOTE — Patient Instructions (Signed)
Hayley Fisher,  I believe your cough is related to a virus you had that gave you the runny nose a few weeks back. Given your age, though, I think it is wise to cover our bases and get an X Ray to look for more serious things like pneumonia or heart failure. Please go to [facility] at [employer] to get your X-Ray done. They are open 7:30a-5p Monday-Friday. You do not need an appointment to get this done.   In the meantime, if you start having fevers now, please call us right away. Of course, if you start to have trouble breathing or chest pain, please go to the emergency room.  Eliezer Mccoy, MD

## 2023-06-26 NOTE — Progress Notes (Signed)
    SUBJECTIVE:   CHIEF COMPLAINT / HPI:   Cough Patient comes in with cough for the past 1 to 2 weeks.  She is accompanied by her son-in-law who helps contribute some history.  She tells me that symptoms started with a "cold" complete with URI symptoms including runny nose, congestion, cough.  No respiratory distress at all.  No chest pain.  No swelling in her lower extremities.  She figured this would run its course, but comes in today because the cough has persisted but both she and her son-in-law will tell me that it seems to be getting better rather than worse.  It is not productive..  She has been treating it with over-the-counter dextromethorphan and cough drops.  She denies any fever at any point during the illness course.  She was admitted in July of this year for COVID with suspected superimposed bacterial PNA treated with Unasyn>Augmentin.     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 No JVD.  Cardio: RRR, 2/6 systolic murmur heard best at RUSB Pulm: Normal WOB on RA, speaking in full sentences, no wheezes, rales, or rhonchi, but lung sounds do seem to be diminished over the bilateral bases Ext: Wearing long stocking, but no evidence of significant edema  ASSESSMENT/PLAN:   Acute cough Most likely this represents a postviral cough.  COVID and flu negative in clinic today.  I am reassured by her lack of fever, productive cough, or hallmarks of CHF on exam.  I am also reassured that she is improving on her own.  Vital signs all within normal limits today.  However, given her advanced age, multiple comorbidities and the abnormality on her pulmonary exam, I do think it is prudent to go ahead and get a chest x-ray to look for evidence of pneumonia  versus CHF, though her clinical picture does not really lend itself to either of these.  No indication for empiric antibiotic coverage at this time -Chest x-ray ordered, she plans to have this done this afternoon -ER precautions reviewed for difficulty breathing or chest pain -Advised to call us ASAP if she starts to fever at this point     J Dorothyann Gibbs, MD [facility]

## 2023-06-26 NOTE — Assessment & Plan Note (Addendum)
Most likely this represents a postviral cough.  I am reassured by her lack of fever, productive cough, or hallmarks of CHF on exam.  I am also reassured that she is improving on her own.  Vital signs all within normal limits today.  However, given her advanced age, multiple comorbidities and the abnormality on her pulmonary exam, I do think it is prudent to go ahead and get a chest x-ray to look for evidence of pneumonia versus CHF, though her clinical picture does not really lend itself to either of these.  No indication for empiric antibiotic coverage at this time -Chest x-ray ordered, she plans to have this done this afternoon -ER precautions reviewed for difficulty breathing or chest pain -Advised to call us ASAP if she starts to fever at this point

## 2023-06-30 ENCOUNTER — Encounter: Payer: Self-pay | Admitting: Internal Medicine

## 2023-06-30 ENCOUNTER — Ambulatory Visit (INDEPENDENT_AMBULATORY_CARE_PROVIDER_SITE_OTHER): Payer: 59 | Admitting: Internal Medicine

## 2023-06-30 VITALS — BP 120/72 | HR 79 | Ht 62.0 in | Wt 130.0 lb

## 2023-06-30 DIAGNOSIS — E039 Hypothyroidism, unspecified: Secondary | ICD-10-CM

## 2023-06-30 DIAGNOSIS — Z794 Long term (current) use of insulin: Secondary | ICD-10-CM

## 2023-06-30 DIAGNOSIS — E1165 Type 2 diabetes mellitus with hyperglycemia: Secondary | ICD-10-CM

## 2023-06-30 DIAGNOSIS — E1140 Type 2 diabetes mellitus with diabetic neuropathy, unspecified: Secondary | ICD-10-CM | POA: Diagnosis not present

## 2023-06-30 LAB — POCT GLYCOSYLATED HEMOGLOBIN (HGB A1C): Hemoglobin A1C: 8.1 % — AB (ref 4.0–5.6)

## 2023-06-30 MED ORDER — METFORMIN HCL ER 500 MG PO TB24: ORAL_TABLET | ORAL | 3 refills | Status: DC

## 2023-06-30 NOTE — Patient Instructions (Signed)
-   Continue   Lantus 10 units daily  - Increase Metformin 500 mg ,2 tablets with breakfast and 1 tablet with supper    HOW TO TREAT LOW BLOOD SUGARS (Blood sugar LESS THAN 70 MG/DL) Please follow the RULE OF 15 for the treatment of hypoglycemia treatment (when your (blood sugars are less than 70 mg/dL)   STEP 1: Take 15 grams of carbohydrates when your blood sugar is low, which includes:  3-4 GLUCOSE TABS  OR 3-4 OZ OF JUICE OR REGULAR SODA OR ONE TUBE OF GLUCOSE GEL    STEP 2: RECHECK blood sugar in 15 MINUTES STEP 3: If your blood sugar is still low at the 15 minute recheck --> then, go back to STEP 1 and treat AGAIN with another 15 grams of carbohydrates.

## 2023-06-30 NOTE — Progress Notes (Signed)
Name: British Weinberg  Age/ Sex: 89 y.o., female   MRN/ DOB: [id number], Feb 10, 1931     PCP: Levin Erp, MD   Reason for Endocrinology Evaluation: Type 2 Diabetes Mellitus  Initial Endocrine Consultative Visit: 09/30/2020    PATIENT IDENTIFIER: [name] is a 89 y.o. female with a past medical history of T2DM, Hx DVT, DJD, Hypothyroidism. The patient has followed with Endocrinology clinic since 09/30/2020 for consultative assistance with management of her diabetes.  DIABETIC HISTORY:  Hayley Fisher was diagnosed with DM 2000. Her hemoglobin A1c has ranged from 6.3% in 2022, peaking at 10.2% in 2021.   Rybelsus was discontinued 10/2022 due to nausea  SUBJECTIVE:   During the last visit (12/26/2022): A1c 6.6%     Today (06/30/2023): Ms. Convey is here for follow-up on diabetes management.  She is accompanied by daughter Misty Stanley. She checks her blood sugars 1 times daily. The patient has not  had hypoglycemic episodes since the last clinic visit.  She was seen by GI for nausea, Rybelsus was discontinued 10/27/2022  Denies nausea or vomiting  Has had constipation  She was seen by podiatry 10/18/2022  Sees Urology for supra-pubic catheter , last UTI  was a few weeks ago     HOME DIABETES REGIMEN:  Lantus 10 units daily  Metformin 500 mg ,2 tablet daily      METER DOWNLOAD SUMMARY: unable to download 90 day average 161  mg/dL   80- 244 mg/dL   DIABETIC COMPLICATIONS: Microvascular complications:  Neuropathy  Denies: CKD  Last Eye Exam: Completed 08/31/2022  Macrovascular complications:   Denies: CAD, CVA, PVD   HISTORY:  Past Medical History:  Past Medical History:  Diagnosis Date   Arthritis    Cataract    bil cateracts removed   Chronic kidney disease    "spot on one of my kidneys" per pt   Diabetes mellitus    Diarrhea 02/20/2022   Gastroesophageal reflux disease without esophagitis 09/16/2020   Glaucoma    Goals of care, counseling/discussion  09/09/2021   Hyperkalemia 07/02/2020   Hyperlipidemia    Hyperplastic colon polyp    Hypertension    Hypothyroidism 12/09/2020   Leukocytopenia 06/27/2021   Metabolic acidosis 07/02/2020   S/P IVC filter 01/16/2021   DVT 12/2020   Syncope    Thyroid disease    Urge incontinence 05/05/2022   Past Surgical History:  Past Surgical History:  Procedure Laterality Date   ABDOMINAL HYSTERECTOMY     BLADDER SUSPENSION     bladder tacking     COLONOSCOPY     EYE SURGERY     INGUINAL HERNIA REPAIR Right    IR CATHETER TUBE CHANGE  09/15/2022   IR IVC FILTER PLMT / S&I /IMG GUID/MOD SED  01/16/2021   IR US GUIDE BX ASP/DRAIN  08/02/2022   Social History:  reports that she quit smoking about 42 years ago. Her smoking use included cigarettes. She has been exposed to tobacco smoke. She has never used smokeless tobacco. She reports that she does not drink alcohol and does not use drugs. Family History:  Family History  Problem Relation Age of Onset   Colon cancer Mother    Other Father        cerebral hemorrhage   Diabetes Brother    Diabetes Sister        x 2   Bone cancer Daughter    Breast cancer Daughter 40   Esophageal cancer Neg Hx    Rectal cancer  Neg Hx    Stomach cancer Neg Hx      HOME MEDICATIONS: Allergies as of 06/30/2023       Reactions   Tramadol Itching, Other (See Comments)   Takes occasionally with Benadryl   Levemir [insulin Detemir] Itching        Medication List        Accurate as of June 30, 2023  3:01 PM. If you have any questions, ask your nurse or doctor.          Accu-Chek Aviva Plus test strip Generic drug: glucose blood USE TO TEST BLOOD SUGAR UP TO 3 TIMES A DAY.   Accu-Chek Softclix Lancets lancets Use as instructed   acetaminophen 500 MG tablet Commonly known as: TYLENOL Take 1,000 mg by mouth every 6 (six) hours as needed for moderate pain.   acetaminophen-codeine 300-30 MG tablet Commonly known as: TYLENOL #3 Take 1  tablet by mouth every 6 (six) hours.   aspirin 81 MG chewable tablet Chew 81 mg by mouth daily.   atorvastatin 40 MG tablet Commonly known as: LIPITOR Take 40 mg by mouth daily.   brimonidine 0.2 % ophthalmic solution Commonly known as: ALPHAGAN Place 1 drop into the left eye 2 (two) times daily.   dorzolamide-timolol 2-0.5 % ophthalmic solution Commonly known as: COSOPT Place 1 drop into both eyes 2 (two) times daily.   doxepin 10 MG/ML solution Commonly known as: SINEQUAN Take 1 mL (10 mg total) by mouth at bedtime as needed for sleep.   famotidine 20 MG tablet Commonly known as: PEPCID TAKE 1 TABLET BY MOUTH EVERY DAY   FeroSul 325 (65 Fe) MG tablet Generic drug: ferrous sulfate TAKE 1 TABLET BY MOUTH DAILY WITH BREAKFAST   Global Ease Inject Pen Needles 32G X 4 MM Misc Generic drug: Insulin Pen Needle USE 1 SYRINGE DAILY TO INJECT VICTOZA   Insulin Syringe-Needle U-100 31G X 5/16" 0.3 ML Misc Commonly known as: Sure Comfort Insulin Syringe Inject 1 Syringe into the skin daily.   Insulin Syringe-Needle U-100 31G X 5/16" 0.3 ML Misc Commonly known as: TRUEplus Insulin Syringe Use as directed   Lantus SoloStar 100 UNIT/ML Solostar Pen Generic drug: insulin glargine Inject 10 Units into the skin daily.   levothyroxine 50 MCG tablet Commonly known as: SYNTHROID Take 1 tablet (50 mcg total) by mouth daily. What changed: when to take this   metFORMIN 500 MG 24 hr tablet Commonly known as: GLUCOPHAGE-XR Take 2 tablets (1,000 mg total) by mouth daily with breakfast.   metoprolol succinate 25 MG 24 hr tablet Commonly known as: TOPROL-XL TAKE 1 TABLET BY MOUTH DAILY   Myrbetriq 25 MG Tb24 tablet Generic drug: mirabegron ER TAKE 1 TABLET BY MOUTH EVERY DAY   polyethylene glycol powder 17 GM/SCOOP powder Commonly known as: GLYCOLAX/MIRALAX Take 17 g by mouth in the morning and at bedtime. What changed:  when to take this reasons to take this   Sarna  lotion Generic drug: camphor-menthol Apply 1 application  topically 4 (four) times daily as needed for itching (affected areas).   senna 8.6 MG Tabs tablet Commonly known as: SENOKOT Take 1 tablet (8.6 mg total) by mouth 2 (two) times daily. What changed:  when to take this reasons to take this   tamsulosin 0.4 MG Caps capsule Commonly known as: FLOMAX TAKE 1 CAPSULE BY MOUTH EVERY DAY   Transfer Board Misc Use as needed for transfer from bed to chair or vice versa   Vyzulta 0.024 %  Soln Generic drug: Latanoprostene Bunod Place 1 drop into both eyes at bedtime.         OBJECTIVE:   Vital Signs: BP 120/72 (BP Location: Right Arm, Patient Position: Sitting, Cuff Size: Small)   Pulse 79   Ht 5\' 2"  (1.575 m)   Wt 130 lb (59 kg)   SpO2 98%   BMI 23.78 kg/m   Wt Readings from Last 3 Encounters:  06/30/23 130 lb (59 kg)  05/31/23 136 lb 11 oz (62 kg)  05/02/23 136 lb 11 oz (62 kg)     Exam: General: Pt appears well and is in NAD  Lungs: Clear with good BS bilat   Heart: RRR   Extremities: 1+ pretibial edema.   Neuro: MS is good with appropriate affect, pt is alert and Ox3      DATA REVIEWED:  Lab Results  Component Value Date   HGBA1C 8.1 (A) 06/30/2023   HGBA1C 8.5 (H) 03/16/2023   HGBA1C 6.6 10/27/2022    Latest Reference Range & Units 10/27/22 16:18  Sodium 134 - 144 mmol/L 140  Potassium 3.5 - 5.2 mmol/L 4.6  Chloride 96 - 106 mmol/L 101  CO2 20 - 29 mmol/L 25  Glucose 70 - 99 mg/dL 409 (H)  BUN 8 - 27 mg/dL 18  Creatinine 8.11 - 9.14 mg/dL 7.82  Calcium 8.7 - 95.6 mg/dL 9.0  BUN/Creatinine Ratio 12 - 28  25  eGFR >59 mL/min/1.73 80  Alkaline Phosphatase 44 - 121 IU/L 76  Albumin 3.7 - 4.7 g/dL 4.2  Albumin/Globulin Ratio 1.2 - 2.2  2.1  AST 0 - 40 IU/L 17  ALT 0 - 32 IU/L 6  Total Protein 6.0 - 8.5 g/dL 6.2  Total Bilirubin 0.0 - 1.2 mg/dL 0.4    ASSESSMENT / PLAN / RECOMMENDATIONS:   1) Type 2 Diabetes Mellitus, Sub-Optimally   controlled, With Neuropathic  complications - Most recent A1c of 8.1%. Goal A1c < 8.0 %.    -A1c has trended up -Intolerant to Rybelsus due to nausea -Not a candidate for SGLT2 inhibitors due to suprapubic catheter and recent UTI -Not a candidate for pioglitazone due to lower extremity edema -GFR is normal, I have suggested increasing metformin as below    MEDICATIONS: Increase metformin 500 mg , 2 tabs with breakfast and 1 tab with supper Continue Lantus 10 units daily   EDUCATION / INSTRUCTIONS: BG monitoring instructions: Patient is instructed to check her blood sugars 1 times a day, fasting. Call [facility] if: BG persistently < 70  I reviewed the Rule of 15 for the treatment of hypoglycemia in detail with the patient. Literature supplied.   2) Diabetic complications:  Eye: Does not have known diabetic retinopathy.  Neuro/ Feet: Does  have known diabetic peripheral neuropathy .  Renal: Patient does not have known baseline CKD. She   is  on an ACEI/ARB at present.    3) Hypothyroidism:  -Patient is clinically euthyroid -TFTs have been within normal range on current dose of levothyroxine -Will recheck on next visit   Medication  Levothyroxine 50 mcg daily     F/U in 4 months   Signed electronically by: Lyndle Herrlich, MD  [facility]  [facility] [address] [employer] [place], [place] [address] Phone: [phone] FAX: [phone]   CC: Levin Erp, MD [address] [place] [address] Phone: [phone]  Fax: [phone]  Return to Endocrinology clinic as below: Future Appointments  Date Time  Provider Department Center  07/10/2023  2:00 PM Helane Gunther, DPM [facility]  03/14/2024 12:30 PM [place], Edmonia James, NP [facility] None

## 2023-07-10 ENCOUNTER — Encounter: Payer: Self-pay | Admitting: Podiatry

## 2023-07-10 ENCOUNTER — Ambulatory Visit (INDEPENDENT_AMBULATORY_CARE_PROVIDER_SITE_OTHER): Payer: 59 | Admitting: Podiatry

## 2023-07-10 DIAGNOSIS — B351 Tinea unguium: Secondary | ICD-10-CM | POA: Diagnosis not present

## 2023-07-10 DIAGNOSIS — E1140 Type 2 diabetes mellitus with diabetic neuropathy, unspecified: Secondary | ICD-10-CM

## 2023-07-10 DIAGNOSIS — Z794 Long term (current) use of insulin: Secondary | ICD-10-CM

## 2023-07-10 DIAGNOSIS — M79674 Pain in right toe(s): Secondary | ICD-10-CM

## 2023-07-10 DIAGNOSIS — M79675 Pain in left toe(s): Secondary | ICD-10-CM

## 2023-07-10 NOTE — Progress Notes (Signed)
This patient returns to my office for at risk foot care.  This patient requires this care by a professional since this patient will be at risk due to having diabetes.  This patient is unable to cut nails herself since the patient cannot reach her nails.These nails are painful walking and wearing shoes.  She presents to the office with her son  in a wheelchair.   This patient presents for at risk foot care today.  General Appearance  Alert, conversant and in no acute stress.  Vascular  Dorsalis pedis and posterior tibial  pulses are  weakly palpable  bilaterally.  Capillary return is within normal limits  bilaterally. Temperature is within normal limits  bilaterally.  Neurologic  Senn-Weinstein monofilament wire test within normal limits  bilaterally. Muscle power within normal limits bilaterally.  Nails Thick disfigured discolored nails with subungual debris  from hallux to fifth toes bilaterally. No evidence of bacterial infection or drainage bilaterally.  Orthopedic  No limitations of motion  feet .  No crepitus or effusions noted.  No bony pathology or digital deformities noted.  Skin  normotropic skin with no porokeratosis noted bilaterally.  No signs of infections or ulcers noted.     Onychomycosis  Pain in right toes  Pain in left toes  Consent was obtained for treatment procedures.   Mechanical debridement of nails 1-5  bilaterally performed with a nail nipper.  Filed with dremel without incident.    Return office visit   4  months                   Told patient to return for periodic foot care and evaluation due to potential at risk complications.   Helane Gunther DPM

## 2023-07-11 ENCOUNTER — Other Ambulatory Visit: Payer: Self-pay | Admitting: Student

## 2023-07-17 ENCOUNTER — Telehealth: Payer: Self-pay | Admitting: Student

## 2023-07-17 NOTE — Telephone Encounter (Signed)
Daughter dropped off form at front desk for [facility].  Verified that patient section of form has been completed.  Last DOS/WCC with PCP was 06/26/23.  Placed form in blue team folder to be completed by clinical staff.  Vilinda Blanks

## 2023-07-17 NOTE — Telephone Encounter (Signed)
Clinical info completed on FMLA form.  Placed form in PCP's box for completion.    When form is completed, please route note to "RN Team" and place in wall pocket in front office.   Aquilla Solian, CMA

## 2023-07-18 DIAGNOSIS — R339 Retention of urine, unspecified: Secondary | ICD-10-CM | POA: Diagnosis not present

## 2023-07-26 NOTE — Telephone Encounter (Signed)
Called daughter and advised of form completion.   Faxed to provided number. Copy made and placed in batch scanning. Original placed in front office files for pick up.   Veronda Prude, RN

## 2023-08-15 DIAGNOSIS — H5213 Myopia, bilateral: Secondary | ICD-10-CM | POA: Diagnosis not present

## 2023-08-18 DIAGNOSIS — R339 Retention of urine, unspecified: Secondary | ICD-10-CM | POA: Diagnosis not present

## 2023-08-24 ENCOUNTER — Other Ambulatory Visit: Payer: Self-pay | Admitting: Student

## 2023-08-24 DIAGNOSIS — R350 Frequency of micturition: Secondary | ICD-10-CM

## 2023-09-04 ENCOUNTER — Other Ambulatory Visit: Payer: Self-pay | Admitting: Student

## 2023-09-18 DIAGNOSIS — R339 Retention of urine, unspecified: Secondary | ICD-10-CM | POA: Diagnosis not present

## 2023-09-18 DIAGNOSIS — R3914 Feeling of incomplete bladder emptying: Secondary | ICD-10-CM | POA: Diagnosis not present

## 2023-09-29 DIAGNOSIS — E113313 Type 2 diabetes mellitus with moderate nonproliferative diabetic retinopathy with macular edema, bilateral: Secondary | ICD-10-CM | POA: Diagnosis not present

## 2023-10-03 ENCOUNTER — Encounter: Payer: Self-pay | Admitting: Student

## 2023-10-03 ENCOUNTER — Ambulatory Visit (INDEPENDENT_AMBULATORY_CARE_PROVIDER_SITE_OTHER): Payer: 59 | Admitting: Student

## 2023-10-03 VITALS — BP 137/82 | HR 90

## 2023-10-03 DIAGNOSIS — Z794 Long term (current) use of insulin: Secondary | ICD-10-CM

## 2023-10-03 DIAGNOSIS — L98421 Non-pressure chronic ulcer of back limited to breakdown of skin: Secondary | ICD-10-CM | POA: Diagnosis not present

## 2023-10-03 DIAGNOSIS — M150 Primary generalized (osteo)arthritis: Secondary | ICD-10-CM | POA: Diagnosis not present

## 2023-10-03 DIAGNOSIS — E1140 Type 2 diabetes mellitus with diabetic neuropathy, unspecified: Secondary | ICD-10-CM

## 2023-10-03 LAB — POCT GLYCOSYLATED HEMOGLOBIN (HGB A1C): HbA1c, POC (controlled diabetic range): 6.9 % (ref 0.0–7.0)

## 2023-10-03 MED ORDER — DICLOFENAC SODIUM 1 % EX GEL
2.0000 g | Freq: Four times a day (QID) | CUTANEOUS | 1 refills | Status: DC
Start: 2023-10-03 — End: 2024-06-22

## 2023-10-03 NOTE — Progress Notes (Signed)
    SUBJECTIVE:   CHIEF COMPLAINT / HPI:   89 year old female who is wheelchair-bound with history of osteoarthritis in multiple joints presenting today for concerns of right knee swelling.  Patient reports some pain associated with the knee.  This has been chronic but has gotten worse in the last few days.  Denies any recent trauma.  No fevers or chills.  Has pain in both knees but right is worse than left.  Daughter who accompanied patient says she does have some palpable lump behind the right knee.  PERTINENT  PMH / PSH: Reviewed   OBJECTIVE:   BP 137/82   Pulse 90   SpO2 97%    Physical Exam General: Alert, well appearing, NAD  Right Knee Exam No significant effusion but palpable lump behind the right knee.  No other gross deformity, ecchymoses. Mild TTP lateral joint line. NV intact distally. Exam limited due to patient being wheel bound  ASSESSMENT/PLAN:   Osteoarthritis, multiple sites Suspect right knee pain and swelling is likely secondary to osteoarthritis.  Complicated also by the fact patient is currently wheelchair-bound.  No significant effusion seen on exam but palpable mass behind the R knee joint suspicious of Baker's cyst.  Given complaints of continued pain will place referral to sports medicine for ultrasound and possible aspiration.  Recommend patient uses knee sleeves to help with intermittent effusion reported by family.  She could also benefit from intra-articular steroid injection.  Given her age will avoid NSAID use  Skin ulcer of sacrum, limited to breakdown of skin (HCC) Stable with no open ulcers at this time.  However still endorses some superficial pains. -Continue daily blood cream and frequent positional change -Try Voltaren gel just over the affected area to provide some local analgesic effect for the pain.     Jerre Simon, MD [facility]

## 2023-10-03 NOTE — Assessment & Plan Note (Signed)
Suspect right knee pain and swelling is likely secondary to osteoarthritis.  Complicated also by the fact patient is currently wheelchair-bound.  No significant effusion seen on exam but palpable mass behind the R knee joint suspicious of Baker's cyst.  Given complaints of continued pain will place referral to sports medicine for ultrasound and possible aspiration.  Recommend patient uses knee sleeves to help with intermittent effusion reported by family.  She could also benefit from intra-articular steroid injection.  Given her age will avoid NSAID use

## 2023-10-03 NOTE — Patient Instructions (Addendum)
Pleasure to see you today.  Your A1c today is great.  At 6.9% and improved from 8.1 few months ago.  I suspect the right knee pain you are having is most likely due to arthritis.  Also there are some swellings at the back of your knee which is suspicious of Baker's cyst.  I will be sending you to sports medicine where they can use ultrasound to see if they can aspirate the cyst.  Also recommend use of knee sleeves to help with compression of the knee joint.  Continue to do Tylenol for any pain.  You are due for tetanus shot and shingles.  You can go to your pharmcy to have this completed

## 2023-10-03 NOTE — Assessment & Plan Note (Signed)
Stable with no open ulcers at this time.  However still endorses some superficial pains. -Continue daily blood cream and frequent positional change -Try Voltaren gel just over the affected area to provide some local analgesic effect for the pain.

## 2023-10-10 ENCOUNTER — Other Ambulatory Visit: Payer: Self-pay | Admitting: Student

## 2023-10-10 ENCOUNTER — Ambulatory Visit: Payer: 59 | Admitting: Podiatry

## 2023-10-10 DIAGNOSIS — R350 Frequency of micturition: Secondary | ICD-10-CM

## 2023-10-17 ENCOUNTER — Ambulatory Visit (INDEPENDENT_AMBULATORY_CARE_PROVIDER_SITE_OTHER): Payer: 59 | Admitting: Family Medicine

## 2023-10-17 ENCOUNTER — Encounter: Payer: Self-pay | Admitting: Family Medicine

## 2023-10-17 VITALS — BP 138/51 | Ht 62.0 in | Wt 130.0 lb

## 2023-10-17 DIAGNOSIS — M62441 Contracture of muscle, right hand: Secondary | ICD-10-CM | POA: Diagnosis not present

## 2023-10-17 DIAGNOSIS — M62442 Contracture of muscle, left hand: Secondary | ICD-10-CM | POA: Diagnosis not present

## 2023-10-17 DIAGNOSIS — M1711 Unilateral primary osteoarthritis, right knee: Secondary | ICD-10-CM

## 2023-10-17 NOTE — Progress Notes (Signed)
 DATE OF VISIT: 10/17/2023        Hayley Fisher DOB: [birth year] MRN: [id number]  CC:  Rt knee pain, hand pain  History- Hayley Fisher is a 89 y.o. female for evaluation and treatment of osteoarthritis Referred by Family Medicine/PCP office for evaluation - seen by Dr Edison Nasuti 10/03/23 - she is wheel-chair bound with hx of OA in multiple joints - seen for concerns of Rt knee swelling with associated pain - pain has been getting worse - pain in both knees -- RT>LT - noted to have palpable lumb behind right knee on exam - referred to sports medicine for further evaluation  Accompanied by son-in-law today  Patient complaints of right knee pain and some slight swelling She is wheelchair-bound -She does not really get out of the chair throughout the day, except to transition to bed.  She does need assistance from daughter and son-in-law for this Symptoms have been ongoing for about 4 months She denies any specific injury or trauma, but has had some falls in the past She does have known history of knee OA Taking anything for the pain currently -Will send a prescription for Voltaren gel at last visit 10/03/2023, but they have not picked this up from the pharmacy Has not had any recent imaging, last x-rays 12/02/2009 showing advanced osteoarthritis of the knee  Also complaining of contractures of both hands Has been ongoing for several years Denies any injury or trauma She is unable to extend her fingers Has tried hand therapy and wearing gloves in the past without any improvement   Past Medical History Past Medical History:  Diagnosis Date   Arthritis    Cataract    bil cateracts removed   Chronic kidney disease    "spot on one of my kidneys" per pt   Diabetes mellitus    Diarrhea 02/20/2022   Gastroesophageal reflux disease without esophagitis 09/16/2020   Glaucoma    Goals of care, counseling/discussion 09/09/2021   Hyperkalemia 07/02/2020   Hyperlipidemia    Hyperplastic colon  polyp    Hypertension    Hypothyroidism 12/09/2020   Leukocytopenia 06/27/2021   Metabolic acidosis 07/02/2020   S/P IVC filter 01/16/2021   DVT 12/2020   Syncope    Thyroid disease    Urge incontinence 05/05/2022    Past Surgical History Past Surgical History:  Procedure Laterality Date   ABDOMINAL HYSTERECTOMY     BLADDER SUSPENSION     bladder tacking     COLONOSCOPY     EYE SURGERY     INGUINAL HERNIA REPAIR Right    IR CATHETER TUBE CHANGE  09/15/2022   IR IVC FILTER PLMT / S&I /IMG GUID/MOD SED  01/16/2021   IR US GUIDE BX ASP/DRAIN  08/02/2022    Medications Current Outpatient Medications  Medication Sig Dispense Refill   ACCU-CHEK AVIVA PLUS test strip USE TO TEST BLOOD SUGAR UP TO 3 TIMES A DAY. 100 strip 3   Accu-Chek Softclix Lancets lancets Use as instructed 100 each 12   acetaminophen (TYLENOL) 500 MG tablet Take 1,000 mg by mouth every 6 (six) hours as needed for moderate pain.     acetaminophen-codeine (TYLENOL #3) 300-30 MG tablet Take 1 tablet by mouth every 6 (six) hours.     aspirin 81 MG chewable tablet Chew 81 mg by mouth daily.     atorvastatin (LIPITOR) 40 MG tablet TAKE 1 TABLET BY MOUTH EVERY DAY 90 tablet 0   brimonidine (ALPHAGAN) 0.2 % ophthalmic solution Place 1 drop  into the left eye 2 (two) times daily.     diclofenac Sodium (VOLTAREN) 1 % GEL Apply 2 g topically 4 (four) times daily. Apply over the affected area.  Please avoid the genital areas. 2 g 1   dorzolamide-timolol (COSOPT) 22.3-6.8 MG/ML ophthalmic solution Place 1 drop into both eyes 2 (two) times daily.     doxepin (SINEQUAN) 10 MG/ML solution Take 1 mL (10 mg total) by mouth at bedtime as needed for sleep. 120 mL 12   famotidine (PEPCID) 20 MG tablet TAKE 1 TABLET BY MOUTH EVERY DAY 60 tablet 2   FEROSUL 325 (65 Fe) MG tablet TAKE 1 TABLET BY MOUTH DAILY WITH BREAKFAST 90 tablet 1   insulin glargine (LANTUS SOLOSTAR) 100 UNIT/ML Solostar Pen Inject 10 Units into the skin daily. 15 mL 0    Insulin Pen Needle (GLOBAL EASE INJECT PEN NEEDLES) 32G X 4 MM MISC USE 1 SYRINGE DAILY TO INJECT VICTOZA 100 each 3   Insulin Syringe-Needle U-100 (SURE COMFORT INSULIN SYRINGE) 31G X 5/16" 0.3 ML MISC Inject 1 Syringe into the skin daily.     Insulin Syringe-Needle U-100 (TRUEPLUS INSULIN SYRINGE) 31G X 5/16" 0.3 ML MISC Use as directed 100 each 2   levothyroxine (SYNTHROID) 50 MCG tablet Take 1 tablet (50 mcg total) by mouth daily. (Patient taking differently: Take 50 mcg by mouth daily before breakfast.) 90 tablet 3   metFORMIN (GLUCOPHAGE-XR) 500 MG 24 hr tablet Take 2 tablets (1,000 mg total) by mouth daily with breakfast AND 1 tablet (500 mg total) daily with supper. 270 tablet 3   metoprolol succinate (TOPROL-XL) 25 MG 24 hr tablet TAKE 1 TABLET BY MOUTH DAILY 90 tablet 0   Misc. Devices (TRANSFER BOARD) MISC Use as needed for transfer from bed to chair or vice versa 1 each 0   MYRBETRIQ 25 MG TB24 tablet TAKE 1 TABLET BY MOUTH EVERY DAY 30 tablet 1   polyethylene glycol powder (GLYCOLAX/MIRALAX) 17 GM/SCOOP powder Take 17 g by mouth in the morning and at bedtime. (Patient taking differently: Take 17 g by mouth 2 (two) times daily as needed for mild constipation.) 238 g 3   SARNA lotion Apply 1 application  topically 4 (four) times daily as needed for itching (affected areas).     senna (SENOKOT) 8.6 MG TABS tablet Take 1 tablet (8.6 mg total) by mouth 2 (two) times daily. (Patient taking differently: Take 1 tablet by mouth daily as needed for mild constipation.) 120 tablet 0   tamsulosin (FLOMAX) 0.4 MG CAPS capsule TAKE 1 CAPSULE BY MOUTH EVERY DAY 30 capsule 1   VYZULTA 0.024 % SOLN Place 1 drop into both eyes at bedtime.     No current facility-administered medications for this visit.    Allergies is allergic to tramadol and levemir [insulin detemir].  Family History - reviewed per EMR and intake form  Social History   reports no history of alcohol use.  reports that she quit  smoking about 42 years ago. Her smoking use included cigarettes. She has been exposed to tobacco smoke. She has never used smokeless tobacco.  reports no history of drug use.   EXAM: Vitals: BP (!) 138/51   Ht 5\' 2"  (1.575 m)   Wt 130 lb (59 kg)   BMI 23.78 kg/m  General: AOx3, NAD, pleasant, seated in wheelchair MSK: Knee: Bilateral knees without effusion.  Does have slight fullness in the posterior aspect of her right knee, no increased redness, no increased warmth.  Good passive range of motion with 1-2+ patellofemoral crepitus.  She has no medial or lateral joint line tenderness bilaterally.  No ligamentous laxity bilaterally.  No calf pain.  Hands: Has bilateral flexion contracture of all fingers of both hands.  He is unable to fully extend her fingers of both hands.  No increased redness or warmth.  She does have prominence of multiple knuckles along the MCPs, PIP, DIP of both hands consistent with osteoarthritis.  No increased redness or warmth.  No bony tenderness throughout the hands  NEURO: sensation intact to light touch extremity bilaterally VASC: 1+ edema lower extremity bilaterally, no increased redness or warmth  IMAGING: XRAYS:  Right knee x-ray 12/02/2009 showing  - advanced osteoarthritis of the knee  Assessment & Plan Primary osteoarthritis of right knee Plan: -Limited bedside ultrasound completed by me today.  Patient is wheelchair-bound, so imaging was technically difficult.  She had no visible effusion.  No Baker's cyst is appreciated.  Did have slight prominence of the subcutaneous tissues, no increased Doppler flow.  Normal-appearing musculature in the posterior aspect of the knee.  Images were not saved. -Recommend Voltaren gel applied every 6 hours as needed -Recommended heat or ice -Can use Tylenol Extra Strength 1-2 tabs every 6-8 hours as needed -Discussed consideration of updated x-rays, will defer at this time.  If having ongoing symptoms that are  worsening or not improving would get updated x-rays before consideration of any further interventions.  Could potentially consider cortisone injection in the future. Contracture of muscle, left hand Bilateral chronic hand contractures which may be related to underlying osteoarthritis versus Dupuytren's contracture -Limited improvement with prior hand therapy and gloves  Plan: -Can use Voltaren gel as needed for any pain -Offered referral to Hand Surgeon to discuss further interventions.  Likely would not be candidate for surgery, but they may have knowledge of other interventions that could be helpful.  Son-in-law states he will discuss with his wife and let us know if they would like a referral Contracture of muscle, right hand Bilateral chronic hand contractures which may be related to underlying osteoarthritis versus Dupuytren's contracture -Limited improvement with prior hand therapy and gloves  Plan: -Can use Voltaren gel as needed for any pain -Offered referral to Hand Surgeon to discuss further interventions.  Likely would not be candidate for surgery, but they may have knowledge of other interventions that could be helpful.  Son-in-law states he will discuss with his wife and let us know if they would like a referral  Patient expressed understanding & agreement with above.  Encounter Diagnoses  Name Primary?   Primary osteoarthritis of right knee Yes   Contracture of muscle, left hand    Contracture of muscle, right hand     No orders of the defined types were placed in this encounter.   No orders of the defined types were placed in this encounter.

## 2023-10-19 DIAGNOSIS — R339 Retention of urine, unspecified: Secondary | ICD-10-CM | POA: Diagnosis not present

## 2023-10-19 DIAGNOSIS — R3914 Feeling of incomplete bladder emptying: Secondary | ICD-10-CM | POA: Diagnosis not present

## 2023-10-26 ENCOUNTER — Ambulatory Visit: Payer: 59

## 2023-10-26 ENCOUNTER — Telehealth: Payer: Self-pay

## 2023-10-26 ENCOUNTER — Telehealth: Payer: 59 | Admitting: Student

## 2023-10-26 VITALS — Ht 62.0 in | Wt 130.0 lb

## 2023-10-26 DIAGNOSIS — N39498 Other specified urinary incontinence: Secondary | ICD-10-CM | POA: Diagnosis not present

## 2023-10-26 DIAGNOSIS — Z0000 Encounter for general adult medical examination without abnormal findings: Secondary | ICD-10-CM | POA: Diagnosis not present

## 2023-10-26 NOTE — Progress Notes (Signed)
 Spoke with patients daughter. Made appt for 3/11 at 3:30. Patient does have another appt that day at 2:20, so patient maybe a little late. Aquilla Solian, CMA

## 2023-10-26 NOTE — Progress Notes (Signed)
 [facility] Telemedicine Visit  Patient consented to have virtual visit and was identified by name and date of birth. Method of visit: Telephone  Encounter participants: Patient: Hayley Fisher  located at home Provider: Levin Erp - located at [facility] Others (if applicable): Annell Greening (Daughter)   Chief Complaint: Urinary Incontinence Supplies   HPI: Patient requires incontinence supplies ongoing for lifetime Uses 4+ pullups a day, chucks and gloves (1/2 Large and other 1/2 Extra large--has personal aide as well and LaLeisa), disposable wipes- requests bigger ones She does have shallow sacral ulcer, donut pillow helping Suprapubic catheter in place and follows with urology-does have some leaking around catheter site but not often Does have history of frequent UTIs  Follows with urology  Previously had been diagnosed with urge incontinence Does have fecal incontinence as well as her mobility is limited and cannot make it to the bathroom in time before soiling herself. Needs incontinence supplies in order to alleviate or prevent skin breakdown Medically necessary due to her medical conditions  ROS: per HPI  Pertinent PMHx: Urge incontinence, suprapubic catheter, T2DM  Exam:  There were no vitals taken for this visit.  Respiratory: Speaking full sentences  Assessment/Plan:  Assessment & Plan Other urinary incontinence Urinary and fecal incontinence requiring medically necessity for incontinence supplies. -DME order placed for incontinence supplies, RN team messaged    Time spent during visit with patient: 30 minutes

## 2023-10-26 NOTE — Assessment & Plan Note (Signed)
 Urinary and fecal incontinence requiring medically necessity for incontinence supplies. -DME order placed for incontinence supplies, RN team messaged

## 2023-10-26 NOTE — Telephone Encounter (Signed)
 Electronically faxed chart note, order and patient demographics to [employer] for patient's incontinence supplies.   Veronda Prude, RN

## 2023-10-26 NOTE — Progress Notes (Signed)
 Subjective:   Hayley Fisher is a 89 y.o. who presents for a Medicare Wellness preventive visit.  Visit Complete: Virtual I connected with  Knute Neu on 10/26/23 by a audio enabled telemedicine application and verified that I am speaking with the correct person using two identifiers.  Patient Location: Home  Provider Location: Office/Clinic  I discussed the limitations of evaluation and management by telemedicine. The patient expressed understanding and agreed to proceed.  Vital Signs: Because this visit was a virtual/telehealth visit, some criteria may be missing or patient reported. Any vitals not documented were not able to be obtained and vitals that have been documented are patient reported.  VideoDeclined- This patient declined Librarian, academic. Therefore the visit was completed with audio only.  AWV Questionnaire: No: Patient Medicare AWV questionnaire was not completed prior to this visit.  Cardiac Risk Factors include: advanced age (>42men, >57 women);diabetes mellitus;dyslipidemia;hypertension;sedentary lifestyle     Objective:    Today's Vitals   10/26/23 1343  Weight: 130 lb (59 kg)  Height: 5\' 2"  (1.575 m)  PainSc: 8   PainLoc: Buttocks   Body mass index is 23.78 kg/m.     10/26/2023    1:48 PM 06/26/2023    1:52 PM 05/02/2023    2:23 PM 03/21/2023    2:28 PM 03/16/2023    6:00 PM 03/16/2023    6:35 AM 03/14/2023    1:48 PM  Advanced Directives  Does Patient Have a Medical Advance Directive? Yes No No No  No No  Type of Media planner of Healthcare Power of Attorney in Chart? No - copy requested        Would patient like information on creating a medical advance directive?  No - Patient declined  No - Patient declined No - Patient declined  No - Patient declined    Current Medications (verified) Outpatient Encounter Medications as of 10/26/2023  Medication Sig   ACCU-CHEK AVIVA  PLUS test strip USE TO TEST BLOOD SUGAR UP TO 3 TIMES A DAY.   Accu-Chek Softclix Lancets lancets Use as instructed   acetaminophen (TYLENOL) 500 MG tablet Take 1,000 mg by mouth every 6 (six) hours as needed for moderate pain.   acetaminophen-codeine (TYLENOL #3) 300-30 MG tablet Take 1 tablet by mouth every 6 (six) hours.   aspirin 81 MG chewable tablet Chew 81 mg by mouth daily.   atorvastatin (LIPITOR) 40 MG tablet TAKE 1 TABLET BY MOUTH EVERY DAY   brimonidine (ALPHAGAN) 0.2 % ophthalmic solution Place 1 drop into the left eye 2 (two) times daily.   diclofenac Sodium (VOLTAREN) 1 % GEL Apply 2 g topically 4 (four) times daily. Apply over the affected area.  Please avoid the genital areas.   dorzolamide-timolol (COSOPT) 22.3-6.8 MG/ML ophthalmic solution Place 1 drop into both eyes 2 (two) times daily.   doxepin (SINEQUAN) 10 MG/ML solution Take 1 mL (10 mg total) by mouth at bedtime as needed for sleep.   famotidine (PEPCID) 20 MG tablet TAKE 1 TABLET BY MOUTH EVERY DAY   FEROSUL 325 (65 Fe) MG tablet TAKE 1 TABLET BY MOUTH DAILY WITH BREAKFAST   insulin glargine (LANTUS SOLOSTAR) 100 UNIT/ML Solostar Pen Inject 10 Units into the skin daily.   Insulin Pen Needle (GLOBAL EASE INJECT PEN NEEDLES) 32G X 4 MM MISC USE 1 SYRINGE DAILY TO INJECT VICTOZA   Insulin Syringe-Needle U-100 (SURE COMFORT INSULIN SYRINGE) 31G X  5/16" 0.3 ML MISC Inject 1 Syringe into the skin daily.   Insulin Syringe-Needle U-100 (TRUEPLUS INSULIN SYRINGE) 31G X 5/16" 0.3 ML MISC Use as directed   levothyroxine (SYNTHROID) 50 MCG tablet Take 1 tablet (50 mcg total) by mouth daily. (Patient taking differently: Take 50 mcg by mouth daily before breakfast.)   metFORMIN (GLUCOPHAGE-XR) 500 MG 24 hr tablet Take 2 tablets (1,000 mg total) by mouth daily with breakfast AND 1 tablet (500 mg total) daily with supper.   metoprolol succinate (TOPROL-XL) 25 MG 24 hr tablet TAKE 1 TABLET BY MOUTH DAILY   Misc. Devices (TRANSFER BOARD)  MISC Use as needed for transfer from bed to chair or vice versa   MYRBETRIQ 25 MG TB24 tablet TAKE 1 TABLET BY MOUTH EVERY DAY   polyethylene glycol powder (GLYCOLAX/MIRALAX) 17 GM/SCOOP powder Take 17 g by mouth in the morning and at bedtime. (Patient taking differently: Take 17 g by mouth 2 (two) times daily as needed for mild constipation.)   SARNA lotion Apply 1 application  topically 4 (four) times daily as needed for itching (affected areas).   senna (SENOKOT) 8.6 MG TABS tablet Take 1 tablet (8.6 mg total) by mouth 2 (two) times daily. (Patient taking differently: Take 1 tablet by mouth daily as needed for mild constipation.)   tamsulosin (FLOMAX) 0.4 MG CAPS capsule TAKE 1 CAPSULE BY MOUTH EVERY DAY   VYZULTA 0.024 % SOLN Place 1 drop into both eyes at bedtime.   No facility-administered encounter medications on file as of 10/26/2023.    Allergies (verified) Tramadol and Levemir [insulin detemir]   History: Past Medical History:  Diagnosis Date   Arthritis    Cataract    bil cateracts removed   Chronic kidney disease    "spot on one of my kidneys" per pt   Diabetes mellitus    Diarrhea 02/20/2022   Gastroesophageal reflux disease without esophagitis 09/16/2020   Glaucoma    Goals of care, counseling/discussion 09/09/2021   Hyperkalemia 07/02/2020   Hyperlipidemia    Hyperplastic colon polyp    Hypertension    Hypothyroidism 12/09/2020   Leukocytopenia 06/27/2021   Metabolic acidosis 07/02/2020   S/P IVC filter 01/16/2021   DVT 12/2020   Syncope    Thyroid disease    Urge incontinence 05/05/2022   Past Surgical History:  Procedure Laterality Date   ABDOMINAL HYSTERECTOMY     BLADDER SUSPENSION     bladder tacking     COLONOSCOPY     EYE SURGERY     INGUINAL HERNIA REPAIR Right    IR CATHETER TUBE CHANGE  09/15/2022   IR IVC FILTER PLMT / S&I /IMG GUID/MOD SED  01/16/2021   IR US GUIDE BX ASP/DRAIN  08/02/2022   Family History  Problem Relation Age of Onset    Colon cancer Mother    Other Father        cerebral hemorrhage   Diabetes Brother    Diabetes Sister        x 2   Bone cancer Daughter    Breast cancer Daughter 110   Esophageal cancer Neg Hx    Rectal cancer Neg Hx    Stomach cancer Neg Hx    Social History   Socioeconomic History   Marital status: Single    Spouse name: Not on file   Number of children: 4   Years of education: Not on file   Highest education level: Not on file  Occupational History   Occupation:  retired  Tobacco Use   Smoking status: Former    Current packs/day: 0.00    Types: Cigarettes    Quit date: 11/29/1980    Years since quitting: 42.9    Passive exposure: Past   Smokeless tobacco: Never  Vaping Use   Vaping status: Never Used  Substance and Sexual Activity   Alcohol use: No    Alcohol/week: 0.0 standard drinks of alcohol   Drug use: No   Sexual activity: Yes    Birth control/protection: None  Other Topics Concern   Not on file  Social History Narrative   04/05/22   Patient lives at home with daughter Nile Riggs husband, and her sister who is 88 years old.    She has an aide that comes to her house in the mornings until the afternoon time and LaLeisa takes care of her after that.        Daughters say that she just got [facility] and they help her for 6 hours a day and are pleased with this Monday, Saturday and Sunday.    Ms Flaming says she would like "1 round of resuscitation efforts peLaLeisa says that her [place] is her son Durinda Buzzelli [phone]).rformed" and then to call it if she were to be hospitalized.    Social Drivers of Corporate investment banker Strain: Low Risk  (10/26/2023)   Overall Financial Resource Strain (CARDIA)    Difficulty of Paying Living Expenses: Not hard at all  Food Insecurity: No Food Insecurity (10/26/2023)   Hunger Vital Sign    Worried About Running Out of Food in the Last Year: Never true    Ran Out of Food in the Last Year: Never true  Transportation  Needs: No Transportation Needs (10/26/2023)   PRAPARE - Administrator, Civil Service (Medical): No    Lack of Transportation (Non-Medical): No  Physical Activity: Inactive (10/26/2023)   Exercise Vital Sign    Days of Exercise per Week: 0 days    Minutes of Exercise per Session: 0 min  Stress: No Stress Concern Present (10/26/2023)   [employer] of Occupational Health - Occupational Stress Questionnaire    Feeling of Stress : Not at all  Social Connections: Moderately Integrated (10/26/2023)   Social Connection and Isolation Panel [NHANES]    Frequency of Communication with Friends and Family: More than three times a week    Frequency of Social Gatherings with Friends and Family: More than three times a week    Attends Religious Services: More than 4 times per year    Active Member of [employer] or Organizations: Yes    Attends Engineer, structural: More than 4 times per year    Marital Status: Never married    Tobacco Counseling Counseling given: Not Answered    Clinical Intake:  Pre-visit preparation completed: Yes  Pain : 0-10 Pain Score: 8  Pain Type: Chronic pain Pain Location: Buttocks Pain Orientation: Other (Comment) (Ulcer)     BMI - recorded: 23.78 Nutritional Status: BMI of 19-24  Normal Nutritional Risks: None Diabetes: Yes CBG done?: No Did pt. bring in CBG monitor from home?: No  How often do you need to have someone help you when you read instructions, pamphlets, or other written materials from your doctor or pharmacy?: 1 - Never What is the last grade level you completed in school?: HSG  Interpreter Needed?: No  Information entered by :: Susie Cassette, LPN.   Activities of Daily Living  10/26/2023    1:50 PM 03/16/2023    6:00 PM  In your present state of health, do you have any difficulty performing the following activities:  Hearing? 1 0  Vision? 0 0  Difficulty concentrating or making decisions? 0 0  Walking or climbing  stairs? 1 1  Dressing or bathing? 1 1  Doing errands, shopping? 1 0  Preparing Food and eating ? N   Using the Toilet? N   In the past six months, have you accidently leaked urine? Y   Comment Puill Ups   Do you have problems with loss of bowel control? Y   Comment gastric cath; pull ups   Managing your Medications? Y   Managing your Finances? Y   Housekeeping or managing your Housekeeping? Y     Patient Care Team: Levin Erp, MD as PCP - General (Student) [name], RN as Case Manager Shamleffer, Konrad Dolores, MD as Attending Physician (Endocrinology) Deno Etienne, MD as Consulting Physician (Retina Ophthalmology) Tressia Miners, FNP as Consulting Physician (Otolaryngology) Pa, [facility] as Consulting Physician (Urology)  Indicate any recent Medical Services you may have received from other than [facility] providers in the past year (date may be approximate).     Assessment:   This is a routine wellness examination for Ryah.  Hearing/Vision screen Hearing Screening - Comments:: Patient has bilateral hearing aids. Vision Screening - Comments:: Wears rx glasses - up to date with routine eye exams with [facility] Laverna Peace, MD)    Goals Addressed             This Visit's Progress    Client understands the importance of follow-up with providers by attending scheduled visits         Depression Screen     10/26/2023    1:58 PM 06/26/2023    1:45 PM 03/21/2023    2:27 PM 02/13/2023    1:25 PM 11/15/2022    1:40 PM 10/27/2022    2:14 PM 10/11/2022   10:41 AM  PHQ 2/9 Scores  PHQ - 2 Score 0 1 0 0 0  0  PHQ- 9 Score 0 2 4 1 1     Exception Documentation      Patient refusal     Fall Risk     10/26/2023    1:50 PM 10/03/2023   11:25 AM 06/26/2023    1:45 PM 03/21/2023    2:28 PM 02/13/2023    1:25 PM  Fall Risk   Falls in the past year? 0 0 0 0 0  Number falls in past yr: 0 0 0 0 0  Injury with Fall? 0 0 0 0 0  Risk for fall due  to : No Fall Risks  Impaired mobility;Impaired balance/gait;No Fall Risks    Follow up Falls prevention discussed;Falls evaluation completed  Falls prevention discussed      MEDICARE RISK AT HOME:  Medicare Risk at Home Any stairs in or around the home?: No If so, are there any without handrails?: No Home free of loose throw rugs in walkways, pet beds, electrical cords, etc?: Yes Adequate lighting in your home to reduce risk of falls?: Yes Life alert?: No Use of a cane, walker or w/c?: Yes Grab bars in the bathroom?: Yes Shower chair or bench in shower?: Yes Elevated toilet seat or a handicapped toilet?: Yes  TIMED UP AND GO:  Was the test performed?  No  Cognitive Function: AWV was completed by daughter with patient  in the room.    10/26/2023    2:01 PM  MMSE - Mini Mental State Exam  Not completed: Unable to complete      05/06/2022    8:24 AM  Montreal Cognitive Assessment   Visuospatial/ Executive (0/5) --  Naming (0/3) 2  Attention: Read list of digits (0/2) 2  Attention: Read list of letters (0/1) 1  Attention: Serial 7 subtraction starting at 100 (0/3) 1  Language: Repeat phrase (0/2) 2  Language : Fluency (0/1) 1  Abstraction (0/2) 0  Delayed Recall (0/5) 0  Orientation (0/6) 3      10/11/2022   10:43 AM  6CIT Screen  What Year? 0 points  What month? 0 points  What time? 0 points  Count back from 20 0 points  Months in reverse 0 points  Repeat phrase 0 points  Total Score 0 points    Immunizations Immunization History  Administered Date(s) Administered   Fluad Quad(high Dose 65+) 08/05/2019, 08/17/2020, 05/11/2021   Fluad Trivalent(High Dose 65+) 06/26/2023   Influenza Split 07/22/2011, 07/30/2012   Influenza Whole 06/09/2008   Influenza, High Dose Seasonal PF 10/16/2018   Influenza,inj,Quad PF,6+ Mos 07/03/2014, 09/09/2015, 08/09/2016   PFIZER(Purple Top)SARS-COV-2 Vaccination 09/23/2019, 09/30/2019, 06/09/2020   Pneumococcal Conjugate-13  07/03/2014   Pneumococcal Polysaccharide-23 08/22/1998   Td 08/22/2001    Screening Tests Health Maintenance  Topic Date Due   Zoster Vaccines- Shingrix (1 of 2) Never done   DTaP/Tdap/Td (2 - Tdap) 08/23/2011   COVID-19 Vaccine (4 - 2024-25 season) 04/23/2023   OPHTHALMOLOGY EXAM  05/05/2023   FOOT EXAM  10/19/2023   HEMOGLOBIN A1C  04/01/2024   Medicare Annual Wellness (AWV)  10/25/2024   Pneumonia Vaccine 42+ Years old  Completed   INFLUENZA VACCINE  Completed   DEXA SCAN  Completed   HPV VACCINES  Aged Out    Health Maintenance  Health Maintenance Due  Topic Date Due   Zoster Vaccines- Shingrix (1 of 2) Never done   DTaP/Tdap/Td (2 - Tdap) 08/23/2011   COVID-19 Vaccine (4 - 2024-25 season) 04/23/2023   OPHTHALMOLOGY EXAM  05/05/2023   FOOT EXAM  10/19/2023   Health Maintenance Items Addressed: Yes; Patient is due for the following: Shingrix, Dtap and Covid vaccines.  She is scheduled for Diabetic Foot Exam 10/2023 and she saw Retina Ophthalmologist in 05/2023.  Additional Screening:  Vision Screening: Recommended annual ophthalmology exams for early detection of glaucoma and other disorders of the eye.  Dental Screening: Recommended annual dental exams for proper oral hygiene  Community Resource Referral / Chronic Care Management: CRR required this visit?  No   CCM required this visit?  No     Plan:     I have personally reviewed and noted the following in the patient's chart:   Medical and social history Use of alcohol, tobacco or illicit drugs  Current medications and supplements including opioid prescriptions. Patient is not currently taking opioid prescriptions. Functional ability and status Nutritional status Physical activity Advanced directives List of other physicians Hospitalizations, surgeries, and ER visits in previous 12 months Vitals Screenings to include cognitive, depression, and falls Referrals and appointments  In addition, I have  reviewed and discussed with patient certain preventive protocols, quality metrics, and best practice recommendations. A written personalized care plan for preventive services as well as general preventive health recommendations were provided to patient.     Mickeal Needy, LPN   08/27/1094   After Visit Summary: (MyChart) Due to this  being a telephonic visit, the after visit summary with patients personalized plan was offered to patient via MyChart   Notes: Please refer to Routing Comments.

## 2023-10-26 NOTE — Patient Instructions (Signed)
 Ms. Solt , Thank you for taking time to come for your Medicare Wellness Visit. I appreciate your ongoing commitment to your health goals. Please review the following plan we discussed and let me know if I can assist you in the future.   Referrals/Orders/Follow-Ups/Clinician Recommendations: Yes; Keep maintaining your health by keeping your appointments with Dr. Raoul Pitch and any specialists that you may see.  Call us if you need anything.  Have a great year!!!!  This is a list of the screening recommended for you and due dates:  Health Maintenance  Topic Date Due   Zoster (Shingles) Vaccine (1 of 2) Never done   DTaP/Tdap/Td vaccine (2 - Tdap) 08/23/2011   COVID-19 Vaccine (4 - 2024-25 season) 04/23/2023   Eye exam for diabetics  05/05/2023   Complete foot exam   10/19/2023   Hemoglobin A1C  04/01/2024   Medicare Annual Wellness Visit  10/25/2024   Pneumonia Vaccine  Completed   Flu Shot  Completed   DEXA scan (bone density measurement)  Completed   HPV Vaccine  Aged Out    Advanced directives: (Copy Requested) Please bring a copy of your health care power of attorney and living will to the office to be added to your chart at your convenience.  Next Medicare Annual Wellness Visit scheduled for next year: Yes

## 2023-10-26 NOTE — Patient Instructions (Signed)
 It was great to see you! Thank you for allowing me to participate in your care!   Our plans for today:  - I will order incontinence supplies and send to RN team to start on!  Take care and seek immediate care sooner if you develop any concerns.  Levin Erp, MD

## 2023-10-30 ENCOUNTER — Ambulatory Visit (INDEPENDENT_AMBULATORY_CARE_PROVIDER_SITE_OTHER): Admitting: Podiatry

## 2023-10-30 ENCOUNTER — Encounter: Payer: Self-pay | Admitting: Podiatry

## 2023-10-30 DIAGNOSIS — E1140 Type 2 diabetes mellitus with diabetic neuropathy, unspecified: Secondary | ICD-10-CM | POA: Diagnosis not present

## 2023-10-30 DIAGNOSIS — M79675 Pain in left toe(s): Secondary | ICD-10-CM

## 2023-10-30 DIAGNOSIS — M79674 Pain in right toe(s): Secondary | ICD-10-CM | POA: Diagnosis not present

## 2023-10-30 DIAGNOSIS — Z794 Long term (current) use of insulin: Secondary | ICD-10-CM | POA: Diagnosis not present

## 2023-10-30 DIAGNOSIS — B351 Tinea unguium: Secondary | ICD-10-CM

## 2023-10-30 NOTE — Progress Notes (Signed)
 This patient returns to my office for at risk foot care.  This patient requires this care by a professional since this patient will be at risk due to having diabetes.  This patient is unable to cut nails herself since the patient cannot reach her nails.These nails are painful walking and wearing shoes.  She presents to the office with her son  in a wheelchair.   This patient presents for at risk foot care today.  General Appearance  Alert, conversant and in no acute stress.  Vascular  Dorsalis pedis and posterior tibial  pulses are  weakly palpable  bilaterally.  Capillary return is within normal limits  bilaterally. Temperature is within normal limits  bilaterally.  Neurologic  Senn-Weinstein monofilament wire test within normal limits  bilaterally. Muscle power within normal limits bilaterally.  Nails Thick disfigured discolored nails with subungual debris  from hallux to fifth toes bilaterally. No evidence of bacterial infection or drainage bilaterally.  Orthopedic  No limitations of motion  feet .  No crepitus or effusions noted.  No bony pathology or digital deformities noted.  Skin  normotropic skin with no porokeratosis noted bilaterally.  No signs of infections or ulcers noted.     Onychomycosis  Pain in right toes  Pain in left toes  Consent was obtained for treatment procedures.   Mechanical debridement of nails 1-5  bilaterally performed with a nail nipper.  Filed with dremel without incident. Cauterized fourth toe left foot due to iatrogenic lesion.  Cauterize fifth toe left foot nail bed.    Return office visit   4  months                   Told patient to return for periodic foot care and evaluation due to potential at risk complications.   Helane Gunther DPM

## 2023-10-31 ENCOUNTER — Encounter: Payer: Self-pay | Admitting: Internal Medicine

## 2023-10-31 ENCOUNTER — Ambulatory Visit (INDEPENDENT_AMBULATORY_CARE_PROVIDER_SITE_OTHER): Payer: 59 | Admitting: Internal Medicine

## 2023-10-31 ENCOUNTER — Encounter: Payer: Self-pay | Admitting: Student

## 2023-10-31 ENCOUNTER — Other Ambulatory Visit: Payer: Self-pay | Admitting: Student

## 2023-10-31 ENCOUNTER — Ambulatory Visit (INDEPENDENT_AMBULATORY_CARE_PROVIDER_SITE_OTHER): Admitting: Student

## 2023-10-31 VITALS — BP 130/72 | HR 74 | Ht 62.0 in

## 2023-10-31 VITALS — BP 151/82 | HR 80

## 2023-10-31 DIAGNOSIS — Z794 Long term (current) use of insulin: Secondary | ICD-10-CM

## 2023-10-31 DIAGNOSIS — E1140 Type 2 diabetes mellitus with diabetic neuropathy, unspecified: Secondary | ICD-10-CM

## 2023-10-31 DIAGNOSIS — E039 Hypothyroidism, unspecified: Secondary | ICD-10-CM

## 2023-10-31 DIAGNOSIS — R197 Diarrhea, unspecified: Secondary | ICD-10-CM | POA: Diagnosis not present

## 2023-10-31 DIAGNOSIS — L98422 Non-pressure chronic ulcer of back with fat layer exposed: Secondary | ICD-10-CM | POA: Diagnosis not present

## 2023-10-31 DIAGNOSIS — R350 Frequency of micturition: Secondary | ICD-10-CM

## 2023-10-31 LAB — POCT GLUCOSE (DEVICE FOR HOME USE): POC Glucose: 252 mg/dL — AB (ref 70–99)

## 2023-10-31 MED ORDER — METFORMIN HCL ER 500 MG PO TB24: 1000.0000 mg | ORAL_TABLET | Freq: Every day | ORAL | 3 refills | Status: DC

## 2023-10-31 MED ORDER — PSYLLIUM 58.6 % PO PACK
1.0000 | PACK | Freq: Every day | ORAL | 12 refills | Status: DC
Start: 2023-10-31 — End: 2024-04-26

## 2023-10-31 MED ORDER — LANTUS SOLOSTAR 100 UNIT/ML ~~LOC~~ SOPN
8.0000 [IU] | PEN_INJECTOR | Freq: Every day | SUBCUTANEOUS | 3 refills | Status: DC
Start: 2023-10-31 — End: 2024-02-08

## 2023-10-31 MED ORDER — INSULIN PEN NEEDLE 32G X 4 MM MISC: 1.0000 | Freq: Every day | 3 refills | Status: DC

## 2023-10-31 NOTE — Progress Notes (Signed)
    SUBJECTIVE:   CHIEF COMPLAINT / HPI: Diarrhea  Presents with diarrhea that has been ongoing for the past week.  She denies having fevers or vomiting but did experience nausea yesterday.  Metformin dosage was recently increased from two to three tablets a day, which coincides with the onset of the diarrhea.  Also takes MiraLAX nightly, but not in past week recently due to the diarrhea Does not take senna No recent antibiotics/travel Not in nursing home Saw endocrinologist today who decreased metformin to 2 tablets daily A1c 6.9  She has a sacral ulcer that has been causing discomfort, particularly when sitting Has been managing the wound care at home, applying a barrier cream frequently.   PERTINENT  PMH / PSH: reviewed  OBJECTIVE:   BP (!) 151/82   Pulse 80   SpO2 100%   General: Well appearing, NAD, awake, alert, responsive to questions Head: Normocephalic atraumatic Respiratory:  chest rises symmetrically,  no increased work of breathing Abdomen: Soft, non-tender, non-distended, normoactive bowel sounds  Rectum: Sacral ulcer exposing fat layer present, not infected appearing (no overlying erythema, discharge present)  Chaperone: Cleatrice Burke CMA present  ASSESSMENT/PLAN:   Assessment & Plan Diarrhea, unspecified type Suspect 2/2 to metformin increased -Decrease to 2 pills today -Advise no miralax/senna -Add fiber (metamucil) to bulk up stools -ED/return precautions discussed -BMP Skin ulcer of sacrum with fat layer exposed (HCC) Not infected appearing -Barrier cream -Home health ordered for wound care nurse   Levin Erp, MD [facility]

## 2023-10-31 NOTE — Patient Instructions (Addendum)
 It was great to see you! Thank you for allowing me to participate in your care!   Our plans for today:  - I am going to check your kidney and electrolytes - I am adding metamucil to help bulk up stools, drink lots of water - hold off on miralax and senna use - we may need to decrease metformin if continuing - return toc are with abdominal pain, fevers, vomiting  Take care and seek immediate care sooner if you develop any concerns.  Levin Erp, MD

## 2023-10-31 NOTE — Patient Instructions (Addendum)
-   Decrease   Lantus 8 units daily  - Continue Metformin 500 mg ,2 tablets with breakfast    HOW TO TREAT LOW BLOOD SUGARS (Blood sugar LESS THAN 70 MG/DL) Please follow the RULE OF 15 for the treatment of hypoglycemia treatment (when your (blood sugars are less than 70 mg/dL)   STEP 1: Take 15 grams of carbohydrates when your blood sugar is low, which includes:  3-4 GLUCOSE TABS  OR 3-4 OZ OF JUICE OR REGULAR SODA OR ONE TUBE OF GLUCOSE GEL    STEP 2: RECHECK blood sugar in 15 MINUTES STEP 3: If your blood sugar is still low at the 15 minute recheck --> then, go back to STEP 1 and treat AGAIN with another 15 grams of carbohydrates.

## 2023-10-31 NOTE — Progress Notes (Unsigned)
 Name: Hayley Fisher  Age/ Sex: 89 y.o., female   MRN/ DOB: [id number], 07-24-1931     PCP: Hayley Erp, MD   Reason for Endocrinology Evaluation: Type 2 Diabetes Mellitus  Initial Endocrine Consultative Visit: 09/30/2020    PATIENT IDENTIFIER: [name] is a 89 y.o. female with a past medical history of T2DM, Hx DVT, DJD, Hypothyroidism. The patient has followed with Endocrinology clinic since 09/30/2020 for consultative assistance with management of her diabetes.  DIABETIC HISTORY:  [name] was diagnosed with DM 2000. Her hemoglobin A1c has ranged from 6.3% in 2022, peaking at 10.2% in 2021.   Rybelsus was discontinued 10/2022 due to nausea  SUBJECTIVE:   During the last visit (06/30/2023): A1c 8.1%     Today (10/31/2023): Hayley Fisher is here for follow-up on diabetes management.  She is accompanied by daughter Hayley Fisher. She checks her blood sugars 1 times daily. The patient has had hypoglycemic episodes since the last clinic visit, she is not symptomatic with these episodes.   She is accompanied by her son-in-law Hayley Fisher nausea or vomiting  Has has been noted with diarrhea for the past 2 weeks , Hayley Fisher fever  She was seen by podiatry 10/30/2023 Sees Urology for supra-pubic catheter    HOME DIABETES REGIMEN:  Lantus 10 units daily  Metformin 500 mg ,3 tabs daily      METER DOWNLOAD SUMMARY: unable to download   DIABETIC COMPLICATIONS: Microvascular complications:  Neuropathy  Hayley Fisher: CKD  Last Eye Exam: Completed 08/31/2022  Macrovascular complications:   Hayley Fisher: CAD, CVA, PVD   HISTORY:  Past Medical History:  Past Medical History:  Diagnosis Date   Arthritis    Cataract    bil cateracts removed   Chronic kidney disease    "spot on one of my kidneys" per pt   Diabetes mellitus    Diarrhea 02/20/2022   Gastroesophageal reflux disease without esophagitis 09/16/2020   Glaucoma    Goals of care, counseling/discussion 09/09/2021   Hyperkalemia  07/02/2020   Hyperlipidemia    Hyperplastic colon polyp    Hypertension    Hypothyroidism 12/09/2020   Leukocytopenia 06/27/2021   Metabolic acidosis 07/02/2020   S/P IVC filter 01/16/2021   DVT 12/2020   Syncope    Thyroid disease    Urge incontinence 05/05/2022   Past Surgical History:  Past Surgical History:  Procedure Laterality Date   ABDOMINAL HYSTERECTOMY     BLADDER SUSPENSION     bladder tacking     COLONOSCOPY     EYE SURGERY     INGUINAL HERNIA REPAIR Right    IR CATHETER TUBE CHANGE  09/15/2022   IR IVC FILTER PLMT / S&I /IMG GUID/MOD SED  01/16/2021   IR US GUIDE BX ASP/DRAIN  08/02/2022   Social History:  reports that she quit smoking about 42 years ago. Her smoking use included cigarettes. She has been exposed to tobacco smoke. She has never used smokeless tobacco. She reports that she does not drink alcohol and does not use drugs. Family History:  Family History  Problem Relation Age of Onset   Colon cancer Mother    Other Father        cerebral hemorrhage   Diabetes Brother    Diabetes Sister        x 2   Bone cancer Daughter    Breast cancer Daughter 60   Esophageal cancer Neg Hx    Rectal cancer Neg Hx    Stomach cancer Neg Hx  HOME MEDICATIONS: Allergies as of 10/31/2023       Reactions   Tramadol Itching, Other (See Comments)   Takes occasionally with Benadryl   Levemir [insulin Detemir] Itching        Medication List        Accurate as of October 31, 2023  2:28 PM. If you have any questions, ask your nurse or doctor.          STOP taking these medications    Insulin Syringe-Needle U-100 31G X 5/16" 0.3 ML Misc Commonly known as: TRUEplus Insulin Syringe Stopped by: Hayley Fisher Hayley Fisher       TAKE these medications    Accu-Chek Aviva Plus test strip Generic drug: glucose blood USE TO TEST BLOOD SUGAR UP TO 3 TIMES A DAY.   Accu-Chek Softclix Lancets lancets Use as instructed   acetaminophen 500 MG tablet Commonly  known as: TYLENOL Take 1,000 mg by mouth every 6 (six) hours as needed for moderate pain.   acetaminophen-codeine 300-30 MG tablet Commonly known as: TYLENOL #3 Take 1 tablet by mouth every 6 (six) hours.   aspirin 81 MG chewable tablet Chew 81 mg by mouth daily.   atorvastatin 40 MG tablet Commonly known as: LIPITOR TAKE 1 TABLET BY MOUTH EVERY DAY   brimonidine 0.2 % ophthalmic solution Commonly known as: ALPHAGAN Place 1 drop into the left eye 2 (two) times daily.   diclofenac Sodium 1 % Gel Commonly known as: Voltaren Apply 2 g topically 4 (four) times daily. Apply over the affected area.  Please avoid the genital areas.   dorzolamide-timolol 2-0.5 % ophthalmic solution Commonly known as: COSOPT Place 1 drop into both eyes 2 (two) times daily.   doxepin 10 MG/ML solution Commonly known as: SINEQUAN Take 1 mL (10 mg total) by mouth at bedtime as needed for sleep.   famotidine 20 MG tablet Commonly known as: PEPCID TAKE 1 TABLET BY MOUTH EVERY DAY   FeroSul 325 (65 Fe) MG tablet Generic drug: ferrous sulfate TAKE 1 TABLET BY MOUTH DAILY WITH BREAKFAST   Insulin Pen Needle 32G X 4 MM Misc 1 Device by Does not apply route daily in the afternoon. What changed:  how much to take how to take this when to take this additional instructions Changed by: Hayley Fisher Hayley Fisher   Lantus SoloStar 100 UNIT/ML Solostar Pen Generic drug: insulin glargine Inject 8 Units into the skin daily. What changed: how much to take Changed by: Hayley Fisher Hayley Fisher   levothyroxine 50 MCG tablet Commonly known as: SYNTHROID Take 1 tablet (50 mcg total) by mouth daily. What changed:  when to take this Another medication with the same name was removed. Continue taking this medication, and follow the directions you see here.   metFORMIN 500 MG 24 hr tablet Commonly known as: GLUCOPHAGE-XR Take 2 tablets (1,000 mg total) by mouth daily with breakfast. What changed: See the new  instructions. Changed by: Hayley Fisher Hayley Fisher   metoprolol succinate 25 MG 24 hr tablet Commonly known as: TOPROL-XL TAKE 1 TABLET BY MOUTH DAILY   Myrbetriq 25 MG Tb24 tablet Generic drug: mirabegron ER TAKE 1 TABLET BY MOUTH EVERY DAY   polyethylene glycol powder 17 GM/SCOOP powder Commonly known as: GLYCOLAX/MIRALAX Take 17 g by mouth in the morning and at bedtime. What changed:  when to take this reasons to take this   Sarna lotion Generic drug: camphor-menthol Apply 1 application  topically 4 (four) times daily as needed for itching (affected areas).  senna 8.6 MG Tabs tablet Commonly known as: SENOKOT Take 1 tablet (8.6 mg total) by mouth 2 (two) times daily. What changed:  when to take this reasons to take this   tamsulosin 0.4 MG Caps capsule Commonly known as: FLOMAX TAKE 1 CAPSULE BY MOUTH EVERY DAY   Transfer Board Misc Use as needed for transfer from bed to chair or vice versa   Vyzulta 0.024 % Soln Generic drug: Latanoprostene Bunod Place 1 drop into both eyes at bedtime.         OBJECTIVE:   Vital Signs: BP 130/72 (BP Location: Left Arm, Patient Position: Sitting, Cuff Size: Small)   Pulse 74   Ht 5\' 2"  (1.575 m)   SpO2 95%   BMI 23.78 kg/m   Wt Readings from Last 3 Encounters:  10/26/23 130 lb (59 kg)  10/17/23 130 lb (59 kg)  06/30/23 130 lb (59 kg)     Exam: General: Pt appears well and is in NAD  Lungs: Clear with good BS bilat   Heart: RRR   Extremities: 1+ pretibial edema.   Neuro: MS is good with appropriate affect, pt is alert and Ox3   Dm Foot Exam 10/30/2023 per podiatry    DATA REVIEWED:  Lab Results  Component Value Date   HGBA1C 6.9 10/03/2023   HGBA1C 8.1 (A) 06/30/2023   HGBA1C 8.5 (H) 03/16/2023    Latest Reference Range & Units 10/27/22 16:18  Sodium 134 - 144 mmol/L 140  Potassium 3.5 - 5.2 mmol/L 4.6  Chloride 96 - 106 mmol/L 101  CO2 20 - 29 mmol/L 25  Glucose 70 - 99 mg/dL 604 (H)  BUN 8 - 27 mg/dL  18  Creatinine 5.40 - 1.00 mg/dL 9.81  Calcium 8.7 - 19.1 mg/dL 9.0  BUN/Creatinine Ratio 12 - 28  25  eGFR >59 mL/min/1.73 80  Alkaline Phosphatase 44 - 121 IU/L 76  Albumin 3.7 - 4.7 g/dL 4.2  Albumin/Globulin Ratio 1.2 - 2.2  2.1  AST 0 - 40 IU/L 17  ALT 0 - 32 IU/L 6  Total Protein 6.0 - 8.5 g/dL 6.2  Total Bilirubin 0.0 - 1.2 mg/dL 0.4    ASSESSMENT / PLAN / RECOMMENDATIONS:   1) Type 2 Diabetes Mellitus, Optimally  controlled, With Neuropathic  complications - Most recent A1c of 6.9%. Goal A1c < 8.0 %.    -A1c has trended down, but the patient has been noted with hypoglycemia -I will decrease the insulin as below -Intolerant to Rybelsus due to nausea -Not a candidate for SGLT2 inhibitors due to suprapubic catheter and recent UTI -Not a candidate for pioglitazone due to lower extremity edema -Intolerant to higher doses of metformin due to nausea   MEDICATIONS: Continue  Metformin 500 mg , 2 tabs with breakfast  Decrease Lantus 8 units daily   EDUCATION / INSTRUCTIONS: BG monitoring instructions: Patient is instructed to check her blood sugars 1 times a day, fasting. Call [place] [facility] if: BG persistently < 70  I reviewed the Rule of 15 for the treatment of hypoglycemia in detail with the patient. Literature supplied.   2) Diabetic complications:  Eye: Does not have known diabetic retinopathy.  Neuro/ Feet: Does  have known diabetic peripheral neuropathy .  Renal: Patient does not have known baseline CKD. She   is  on an ACEI/ARB at present.    3) Hypothyroidism:  -Patient is clinically euthyroid -TFTs are normal, no change   Medication  Levothyroxine 50 mcg daily  F/U in 6 months   Signed electronically by: Lyndle Herrlich, MD  [facility]  [facility] [address] [place] [place], [place] [address] Phone: [phone] FAX: [phone]   CC: Hayley Erp, MD [address] [place]  [place] [address] Phone: [phone]  Fax: [phone]  Return to Endocrinology clinic as below: Future Appointments  Date Time Provider Department Center  10/31/2023  3:30 PM Hayley Erp, MD [facility]  01/31/2024  1:30 PM Helane Gunther, DPM [facility]  03/14/2024 12:30 PM [place]sh[place], Edmonia James, NP [facility] None  10/28/2024  1:40 PM [facility] ANNUAL Joan Flores VISIT [facility]

## 2023-11-01 ENCOUNTER — Encounter: Payer: Self-pay | Admitting: Student

## 2023-11-01 ENCOUNTER — Encounter: Payer: Self-pay | Admitting: Internal Medicine

## 2023-11-01 LAB — BASIC METABOLIC PANEL
BUN/Creatinine Ratio: 21 (ref 12–28)
BUN: 21 mg/dL (ref 10–36)
CO2: 23 mmol/L (ref 20–29)
Calcium: 8.9 mg/dL (ref 8.7–10.3)
Chloride: 100 mmol/L (ref 96–106)
Creatinine, Ser: 0.99 mg/dL (ref 0.57–1.00)
Glucose: 233 mg/dL — ABNORMAL HIGH (ref 70–99)
Potassium: 4.8 mmol/L (ref 3.5–5.2)
Sodium: 139 mmol/L (ref 134–144)
eGFR: 54 mL/min/{1.73_m2} — ABNORMAL LOW (ref >59)

## 2023-11-01 LAB — TSH: TSH: 2.96 m[IU]/L (ref 0.40–4.50)

## 2023-11-01 MED ORDER — LEVOTHYROXINE SODIUM 50 MCG PO TABS
50.0000 ug | ORAL_TABLET | Freq: Every day | ORAL | 3 refills | Status: DC
Start: 2023-11-01 — End: 2024-05-22

## 2023-11-02 ENCOUNTER — Ambulatory Visit: Payer: 59 | Admitting: Podiatry

## 2023-11-02 DIAGNOSIS — R55 Syncope and collapse: Secondary | ICD-10-CM | POA: Diagnosis not present

## 2023-11-02 DIAGNOSIS — Z960 Presence of urogenital implants: Secondary | ICD-10-CM | POA: Diagnosis not present

## 2023-11-02 DIAGNOSIS — E119 Type 2 diabetes mellitus without complications: Secondary | ICD-10-CM | POA: Diagnosis not present

## 2023-11-02 DIAGNOSIS — I493 Ventricular premature depolarization: Secondary | ICD-10-CM | POA: Diagnosis not present

## 2023-11-02 DIAGNOSIS — R8281 Pyuria: Secondary | ICD-10-CM | POA: Diagnosis not present

## 2023-11-02 DIAGNOSIS — Z794 Long term (current) use of insulin: Secondary | ICD-10-CM | POA: Diagnosis not present

## 2023-11-02 DIAGNOSIS — I491 Atrial premature depolarization: Secondary | ICD-10-CM | POA: Diagnosis not present

## 2023-11-02 DIAGNOSIS — Z87891 Personal history of nicotine dependence: Secondary | ICD-10-CM | POA: Diagnosis not present

## 2023-11-02 DIAGNOSIS — I10 Essential (primary) hypertension: Secondary | ICD-10-CM | POA: Diagnosis not present

## 2023-11-03 ENCOUNTER — Telehealth: Payer: Self-pay

## 2023-11-03 NOTE — Transitions of Care (Post Inpatient/ED Visit) (Signed)
 11/03/2023  Name: Hayley Fisher MRN: [id number] DOB: 1931-03-05  Today's TOC FU Call Status: Today's TOC FU Call Status:: Successful TOC FU Call Completed TOC FU Call Complete Date: 11/03/23 Patient's Name and Date of Birth confirmed.  Transition Care Management Follow-up Telephone Call Date of Discharge: 11/02/23 Discharge Facility: Other ([facility]) Name of Other ([facility]) Discharge Facility: [facility] Type of Discharge: Emergency Department Reason for ED Visit: Other: (pyuria) How have you been since you were released from the hospital?: Better Any questions or concerns?: No  Items Reviewed: Did you receive and understand the discharge instructions provided?: Yes Medications obtained,verified, and reconciled?: Yes (Medications Reviewed) Any new allergies since your discharge?: No Dietary orders reviewed?: Yes Do you have support at home?: Yes People in Home: child(ren), adult  Medications Reviewed Today: Medications Reviewed Today     Reviewed by Karena Addison, LPN (Licensed Practical Nurse) on 11/03/23 at [phone]  Med List Status: <None>   Medication Order Taking? Sig Documenting Provider Last Dose Status Informant  ACCU-CHEK AVIVA PLUS test strip [id number] No USE TO TEST BLOOD SUGAR UP TO 3 TIMES A DAY. Shamleffer, Konrad Dolores, MD Taking Active   Accu-Chek Softclix Lancets lancets [id number] No Use as instructed Levin Erp, MD Taking Active   acetaminophen (TYLENOL) 500 MG tablet [id number] No Take 1,000 mg by mouth every 6 (six) hours as needed for moderate pain. [provider] Taking Active Family Member  acetaminophen-codeine (TYLENOL #3) 300-30 MG tablet [id number] No Take 1 tablet by mouth every 6 (six) hours. [provider] Taking Active   aspirin 81 MG chewable tablet [id number] No Chew 81 mg by mouth daily. [provider] Taking Active Family Member  atorvastatin (LIPITOR) 40 MG tablet [id number] No TAKE 1 TABLET BY MOUTH  EVERY DAY Levin Erp, MD Taking Active   brimonidine (ALPHAGAN) 0.2 % ophthalmic solution [id number] No Place 1 drop into the left eye 2 (two) times daily. [provider] Taking Active Family Member  diclofenac Sodium (VOLTAREN) 1 % GEL [id number] No Apply 2 g topically 4 (four) times daily. Apply over the affected area.  Please avoid the genital areas. Jerre Simon, MD Taking Active   dorzolamide-timolol (COSOPT) 22.3-6.8 MG/ML ophthalmic solution [id number] No Place 1 drop into both eyes 2 (two) times daily. [provider] Taking Active Family Member           Med Note Christie Beckers Jul 05, 2021  9:42 PM)    doxepin [facility]) 10 MG/ML solution [id number] No Take 1 mL (10 mg total) by mouth at bedtime as needed for sleep. McDiarmid, Leighton Roach, MD Taking Active Family Member  famotidine (PEPCID) 20 MG tablet [id number] No TAKE 1 TABLET BY MOUTH EVERY DAY Levin Erp, MD Taking Active   FEROSUL 325 (65 Fe) MG tablet [id number] No TAKE 1 TABLET BY MOUTH DAILY WITH BREAKFAST Levin Erp, MD Taking Active   insulin glargine (LANTUS SOLOSTAR) 100 UNIT/ML Solostar Pen [id number]  Inject 8 Units into the skin daily. Shamleffer, Konrad Dolores, MD  Active   Insulin Pen Needle 32G X 4 MM MISC [id number]  1 Device by Does not apply route daily in the afternoon. Shamleffer, Konrad Dolores, MD  Active   levothyroxine (SYNTHROID) 50 MCG tablet [id number]  Take 1 tablet (50 mcg total) by mouth daily. Shamleffer, Konrad Dolores, MD  Active   metFORMIN (GLUCOPHAGE-XR) 500 MG 24 hr tablet [id number]  Take 2 tablets (1,000 mg total) by mouth daily with breakfast. Shamleffer, Konrad Dolores,  MD  Active   metoprolol succinate (TOPROL-XL) 25 MG 24 hr tablet [id number] No TAKE 1 TABLET BY MOUTH DAILY Levin Erp, MD Taking Active   Misc. Devices (TRANSFER BOARD) MISC [id number] No Use as needed for transfer from bed to chair or vice versa Lurene Shadow, MD Taking Active Family  Member  MYRBETRIQ 25 MG TB24 tablet [id number]  TAKE 1 TABLET BY MOUTH EVERY DAY Levin Erp, MD  Active   polyethylene glycol powder (GLYCOLAX/MIRALAX) 17 GM/SCOOP powder [id number] No Take 17 g by mouth in the morning and at bedtime.  Patient taking differently: Take 17 g by mouth 2 (two) times daily as needed for mild constipation.   Levin Erp, MD Taking Active Family Member  psyllium (METAMUCIL) 58.6 % packet [id number]  Take 1 packet by mouth daily. Levin Erp, MD  Active   [facility] lotion [id number] No Apply 1 application  topically 4 (four) times daily as needed for itching (affected areas). [provider] Taking Active Family Member  senna (SENOKOT) 8.6 MG TABS tablet [id number] No Take 1 tablet (8.6 mg total) by mouth 2 (two) times daily.  Patient taking differently: Take 1 tablet by mouth daily as needed for mild constipation.   Alicia Amel, MD Taking Active Family Member  tamsulosin [facility]) 0.4 MG CAPS capsule [id number]  TAKE 1 CAPSULE BY MOUTH EVERY DAY Levin Erp, MD  Active   VYZULTA 0.024 % SOLN [id number] No Place 1 drop into both eyes at bedtime. [provider] Taking Active Family Member            Home Care and Equipment/Supplies: Were Home Health Services Ordered?: NA Any new equipment or medical supplies ordered?: NA  Functional Questionnaire: Do you need assistance with bathing/showering or dressing?: No Do you need assistance with meal preparation?: No Do you need assistance with eating?: No Do you have difficulty maintaining continence: Yes Do you need assistance with getting out of bed/getting out of a chair/moving?: No Do you have difficulty managing or taking your medications?: No  Follow up appointments reviewed: PCP Follow-up appointment confirmed?: NA Specialist Hospital Follow-up appointment confirmed?: NA Do you need transportation to your follow-up appointment?: No Do you understand care options if your  condition(s) worsen?: Yes-patient verbalized understanding    SIGNATURE Karena Addison, LPN [facility] Nurse Health Advisor Direct Dial [phone]

## 2023-11-08 ENCOUNTER — Other Ambulatory Visit: Payer: Self-pay | Admitting: Student

## 2023-11-08 DIAGNOSIS — R350 Frequency of micturition: Secondary | ICD-10-CM

## 2023-11-16 DIAGNOSIS — R3914 Feeling of incomplete bladder emptying: Secondary | ICD-10-CM | POA: Diagnosis not present

## 2023-11-16 DIAGNOSIS — R339 Retention of urine, unspecified: Secondary | ICD-10-CM | POA: Diagnosis not present

## 2023-12-04 ENCOUNTER — Ambulatory Visit: Admitting: Student

## 2023-12-04 ENCOUNTER — Telehealth: Payer: Self-pay

## 2023-12-04 ENCOUNTER — Ambulatory Visit (INDEPENDENT_AMBULATORY_CARE_PROVIDER_SITE_OTHER): Admitting: Student

## 2023-12-04 ENCOUNTER — Other Ambulatory Visit: Payer: Self-pay | Admitting: Student

## 2023-12-04 VITALS — BP 137/80 | HR 84 | Temp 98.2°F

## 2023-12-04 DIAGNOSIS — R4689 Other symptoms and signs involving appearance and behavior: Secondary | ICD-10-CM | POA: Diagnosis not present

## 2023-12-04 DIAGNOSIS — K219 Gastro-esophageal reflux disease without esophagitis: Secondary | ICD-10-CM

## 2023-12-04 DIAGNOSIS — R4189 Other symptoms and signs involving cognitive functions and awareness: Secondary | ICD-10-CM | POA: Insufficient documentation

## 2023-12-04 DIAGNOSIS — R350 Frequency of micturition: Secondary | ICD-10-CM

## 2023-12-04 MED ORDER — REXULTI 0.5 MG PO TABS: ORAL_TABLET | ORAL | 3 refills | Status: DC

## 2023-12-04 NOTE — Progress Notes (Signed)
    SUBJECTIVE:   CHIEF COMPLAINT / HPI:   Tomma Ehinger is a 89 year-old female here with daughter Edwina Gram to discuss changes in behavior.  She has a well-known diagnosis of dementia. Used to take Rexulti for behavioral agitation, but has not taken it in some years. Daughter notices that for the last month or so, Carolynn has become anxious and has been having more behavioral changes with bad dreams about babies and animals and having some hallucinations.  Khloe lives at home with her daughter and has a CNA Monday through Saturday for 6 hours at a time.  PERTINENT  PMH / PSH:  Dementia  OBJECTIVE:   BP 137/80   Pulse 84   Temp 98.2 F (36.8 C)   SpO2 96%   General: Elderly female, sitting in wheelchair, no distress, pleasant, well-groomed  CV: RRR Respiratory: Normal effort on room air  ASSESSMENT/PLAN:   Cognitive and behavioral changes Behavioral agitation and changes in the setting of known dementia Prior good response to brexpiprazole, no contraindications Has excellent family support Start brexpiprazole 1 tablet (0.5mg ) daily for 7 days, then increase to 2 tablets (1 mg) thereafter Follow-up with PCP for reassessment in 1 month     Vallorie Gayer, DO [facility]

## 2023-12-04 NOTE — Assessment & Plan Note (Addendum)
 Behavioral agitation and changes in the setting of known dementia Prior good response to brexpiprazole, no contraindications Has excellent family support Start brexpiprazole 1 tablet (0.5mg ) daily for 7 days, then increase to 2 tablets (1 mg) thereafter Follow-up with PCP for reassessment in 1 month

## 2023-12-04 NOTE — Patient Instructions (Addendum)
 It was great seeing you today.  As we discussed, - Restart Rexulti, sent to your pharmacy - Ways to keep your mind and body healthy: Stay in touch with friends/loved ones, Engaging in activities that stimulate the mind, such as puzzles, reading, learning new skills, or participating in social groups, helps maintain cognitive function and reduce the risk of cognitive decline.   If you have any questions or concerns, please feel free to call the clinic.   Have a wonderful day,  Dr. Vallorie Gayer [facility] [phone]

## 2023-12-04 NOTE — Telephone Encounter (Signed)
 Received call from pharmacy that Brexpiprazole is requiring PA.   PA submitted via covermymeds.   Key: [id number]  Will await response.   Elsie Halo, RN

## 2023-12-05 DIAGNOSIS — R8279 Other abnormal findings on microbiological examination of urine: Secondary | ICD-10-CM | POA: Diagnosis not present

## 2023-12-05 DIAGNOSIS — R3914 Feeling of incomplete bladder emptying: Secondary | ICD-10-CM | POA: Diagnosis not present

## 2023-12-05 DIAGNOSIS — N3020 Other chronic cystitis without hematuria: Secondary | ICD-10-CM | POA: Diagnosis not present

## 2023-12-06 NOTE — Telephone Encounter (Signed)
 Additional information given to patients insurance.   [employer] reports a decision in 24-48 hours.   Will await response.

## 2023-12-08 ENCOUNTER — Other Ambulatory Visit: Payer: Self-pay | Admitting: Student

## 2023-12-11 NOTE — Telephone Encounter (Signed)
 PA approved by insurance.   Called pharmacy with approval.   Medication approved through 08/21/2024.  Pharmacy states that they have to order medication and should be here by tomorrow. They will contact patient once rx is ready for pick up.   Elsie Halo, RN

## 2023-12-19 DIAGNOSIS — R3914 Feeling of incomplete bladder emptying: Secondary | ICD-10-CM | POA: Diagnosis not present

## 2023-12-26 ENCOUNTER — Other Ambulatory Visit: Payer: Self-pay | Admitting: Student

## 2024-01-08 ENCOUNTER — Other Ambulatory Visit: Payer: Self-pay | Admitting: Student

## 2024-01-18 DIAGNOSIS — R3914 Feeling of incomplete bladder emptying: Secondary | ICD-10-CM | POA: Diagnosis not present

## 2024-01-25 ENCOUNTER — Emergency Department (HOSPITAL_COMMUNITY)
Admission: EM | Admit: 2024-01-25 | Discharge: 2024-01-25 | Disposition: A | Discharge status: Home / Self Care | Attending: Emergency Medicine | Admitting: Emergency Medicine

## 2024-01-25 ENCOUNTER — Emergency Department (HOSPITAL_COMMUNITY)

## 2024-01-25 ENCOUNTER — Other Ambulatory Visit: Payer: Self-pay

## 2024-01-25 ENCOUNTER — Encounter (HOSPITAL_COMMUNITY): Payer: Self-pay

## 2024-01-25 DIAGNOSIS — F0390 Unspecified dementia without behavioral disturbance: Secondary | ICD-10-CM | POA: Diagnosis not present

## 2024-01-25 DIAGNOSIS — Z794 Long term (current) use of insulin: Secondary | ICD-10-CM | POA: Diagnosis not present

## 2024-01-25 DIAGNOSIS — R531 Weakness: Secondary | ICD-10-CM | POA: Diagnosis not present

## 2024-01-25 DIAGNOSIS — J984 Other disorders of lung: Secondary | ICD-10-CM | POA: Diagnosis not present

## 2024-01-25 DIAGNOSIS — N3000 Acute cystitis without hematuria: Secondary | ICD-10-CM | POA: Diagnosis not present

## 2024-01-25 DIAGNOSIS — R6889 Other general symptoms and signs: Secondary | ICD-10-CM | POA: Diagnosis not present

## 2024-01-25 DIAGNOSIS — J9811 Atelectasis: Secondary | ICD-10-CM | POA: Diagnosis not present

## 2024-01-25 DIAGNOSIS — E162 Hypoglycemia, unspecified: Secondary | ICD-10-CM | POA: Insufficient documentation

## 2024-01-25 DIAGNOSIS — E161 Other hypoglycemia: Secondary | ICD-10-CM | POA: Diagnosis not present

## 2024-01-25 DIAGNOSIS — I499 Cardiac arrhythmia, unspecified: Secondary | ICD-10-CM | POA: Diagnosis not present

## 2024-01-25 DIAGNOSIS — Z743 Need for continuous supervision: Secondary | ICD-10-CM | POA: Diagnosis not present

## 2024-01-25 DIAGNOSIS — I10 Essential (primary) hypertension: Secondary | ICD-10-CM | POA: Diagnosis not present

## 2024-01-25 DIAGNOSIS — R0989 Other specified symptoms and signs involving the circulatory and respiratory systems: Secondary | ICD-10-CM | POA: Diagnosis not present

## 2024-01-25 DIAGNOSIS — E11649 Type 2 diabetes mellitus with hypoglycemia without coma: Secondary | ICD-10-CM | POA: Diagnosis not present

## 2024-01-25 LAB — URINALYSIS, ROUTINE W REFLEX MICROSCOPIC
Bilirubin Urine: NEGATIVE
Glucose, UA: NEGATIVE mg/dL
Ketones, ur: NEGATIVE mg/dL
Nitrite: POSITIVE — AB
Protein, ur: NEGATIVE mg/dL
Specific Gravity, Urine: 1.009 (ref 1.005–1.030)
WBC, UA: >50 WBC/hpf (ref 0–5)
pH: 7 (ref 5.0–8.0)

## 2024-01-25 LAB — CBC WITH DIFFERENTIAL/PLATELET
Abs Immature Granulocytes: 0.01 10*3/uL (ref 0.00–0.07)
Basophils Absolute: 0 10*3/uL (ref 0.0–0.1)
Basophils Relative: 1 %
Eosinophils Absolute: 0.1 10*3/uL (ref 0.0–0.5)
Eosinophils Relative: 1 %
HCT: 38.2 % (ref 36.0–46.0)
Hemoglobin: 12 g/dL (ref 12.0–15.0)
Immature Granulocytes: 0 %
Lymphocytes Relative: 16 %
Lymphs Abs: 0.7 10*3/uL (ref 0.7–4.0)
MCH: 30.6 pg (ref 26.0–34.0)
MCHC: 31.4 g/dL (ref 30.0–36.0)
MCV: 97.4 fL (ref 80.0–100.0)
Monocytes Absolute: 0.3 10*3/uL (ref 0.1–1.0)
Monocytes Relative: 7 %
Neutro Abs: 3.2 10*3/uL (ref 1.7–7.7)
Neutrophils Relative %: 75 %
Platelets: 193 10*3/uL (ref 150–400)
RBC: 3.92 MIL/uL (ref 3.87–5.11)
RDW: 13.3 % (ref 11.5–15.5)
WBC: 4.2 10*3/uL (ref 4.0–10.5)
nRBC: 0 % (ref 0.0–0.2)

## 2024-01-25 LAB — CBG MONITORING, ED
Glucose-Capillary: 75 mg/dL (ref 70–99)
Glucose-Capillary: 88 mg/dL (ref 70–99)
Glucose-Capillary: 128 mg/dL — ABNORMAL HIGH (ref 70–99)
Glucose-Capillary: 377 mg/dL — ABNORMAL HIGH (ref 70–99)

## 2024-01-25 LAB — COMPREHENSIVE METABOLIC PANEL WITH GFR
ALT: 10 U/L (ref 0–44)
AST: 20 U/L (ref 15–41)
Albumin: 3.5 g/dL (ref 3.5–5.0)
Alkaline Phosphatase: 46 U/L (ref 38–126)
Anion gap: 12 (ref 5–15)
BUN: 22 mg/dL (ref 8–23)
CO2: 22 mmol/L (ref 22–32)
Calcium: 8.8 mg/dL — ABNORMAL LOW (ref 8.9–10.3)
Chloride: 104 mmol/L (ref 98–111)
Creatinine, Ser: 0.78 mg/dL (ref 0.44–1.00)
GFR, Estimated: >60 mL/min (ref >60)
Glucose, Bld: 223 mg/dL — ABNORMAL HIGH (ref 70–99)
Potassium: 4.8 mmol/L (ref 3.5–5.1)
Sodium: 138 mmol/L (ref 135–145)
Total Bilirubin: 0.6 mg/dL (ref 0.0–1.2)
Total Protein: 6 g/dL — ABNORMAL LOW (ref 6.5–8.1)

## 2024-01-25 MED ORDER — CEPHALEXIN 250 MG PO CAPS: 250.0000 mg | ORAL_CAPSULE | Freq: Four times a day (QID) | ORAL | 0 refills | Status: DC

## 2024-01-25 MED ORDER — SODIUM CHLORIDE 0.9% FLUSH
3.0000 mL | Freq: Two times a day (BID) | INTRAVENOUS | Status: DC
  Administered 2024-01-25: 3 mL via INTRAVENOUS

## 2024-01-25 MED ORDER — METOPROLOL SUCCINATE ER 25 MG PO TB24
25.0000 mg | ORAL_TABLET | Freq: Once | ORAL | Status: AC
  Administered 2024-01-25: 25 mg via ORAL
  Filled 2024-01-25: qty 1

## 2024-01-25 MED ORDER — SODIUM CHLORIDE 0.9 % IV SOLN: 250.0000 mL | INTRAVENOUS | Status: DC | PRN

## 2024-01-25 MED ORDER — CEPHALEXIN 250 MG PO CAPS
500.0000 mg | ORAL_CAPSULE | Freq: Once | ORAL | Status: AC
  Administered 2024-01-25: 500 mg via ORAL
  Filled 2024-01-25: qty 2

## 2024-01-25 MED ORDER — AMLODIPINE BESYLATE 5 MG PO TABS: 5.0000 mg | ORAL_TABLET | Freq: Every day | ORAL | 0 refills | Status: DC

## 2024-01-25 MED ORDER — SODIUM CHLORIDE 0.9% FLUSH: 3.0000 mL | INTRAVENOUS | Status: DC | PRN

## 2024-01-25 MED ORDER — ACETAMINOPHEN 325 MG PO TABS
650.0000 mg | ORAL_TABLET | Freq: Once | ORAL | Status: AC
  Administered 2024-01-25: 650 mg via ORAL
  Filled 2024-01-25: qty 2

## 2024-01-25 MED ORDER — AMLODIPINE BESYLATE 5 MG PO TABS
5.0000 mg | ORAL_TABLET | Freq: Once | ORAL | Status: AC
  Administered 2024-01-25: 5 mg via ORAL
  Filled 2024-01-25: qty 1

## 2024-01-25 NOTE — ED Notes (Signed)
 XR at bedside

## 2024-01-25 NOTE — ED Notes (Signed)
 Daughter at bedside.

## 2024-01-25 NOTE — ED Notes (Signed)
 MD notified of two hemolyzed CMP. Phleb to recollect.

## 2024-01-25 NOTE — ED Notes (Signed)
 Patient discharged by RN, with family to lobby

## 2024-01-25 NOTE — ED Provider Notes (Signed)
 Patient signed out to me at 1600 by Dr. Aldean Amass pending reassessment.  In short this is a 89 year old female with a past medical history of dementia, diabetes, hypothyroidism and frequent UTIs that presented to the emergency department with hypoglycemia.  Patient was found this morning slurring her words with a blood sugar in the 40s.  It improved with juice.  She is now back to her baseline.  The patient has been hemodynamically stable, was initially yelling and agitated on my arrival complaining of pain in her back.  The patient was hypertensive throughout her ED visit and was given blood pressure control as well as pain control with Tylenol .  Urine was positive for UTI and she was treated with Keflex .  Patient's pending reassessment and her daughter briefly left the department to determine disposition.  She will likely be stable for discharge home on antibiotics.  6:02 PM Patient remains at baseline. Daughter at bedside. Pain improved and BP improved to 130s. She is stable for discharge home on antibiotics and will be given BP meds. Recommended close PCP follow up.   Kingsley, Keni Elison K, DO 01/25/24 [phone]

## 2024-01-25 NOTE — ED Provider Notes (Signed)
 [place] EMERGENCY DEPARTMENT AT [facility] Provider Note   CSN: [id number] Arrival date & time: 01/25/24  1128     History  Chief Complaint  Patient presents with   Hypoglycemia    Hayley Fisher is a 89 y.o. female.  HPI 89 year old female presents with hypoglycemia.  History is from the daughter primarily.  Patient has a history of dementia and has had progressively poor appetite for the last several weeks.  She will frequently get UTIs and has a suprapubic catheter which has slow down the UTIs but they still occur.  Daughter is concerned about a recurrent UTI because she has had some on and off confusion and acting differently which is her typical UTI symptom over the last couple days.  This morning she was confused but was also slurring her words while her aide was giving a bath and the daughter checked her blood sugar and it was in the 40s.  Gave her some juice and it came up and the slurred speech has resolved.  Right now the patient is acting at her normal baseline according to the daughter.  No recent fevers.  No recent medication changes, last received her insulin  yesterday, has not gotten any meds today.  Had not yet eaten breakfast when this occurred.  Patient tells me that she is hungry.  Home Medications Prior to Admission medications   Medication Sig Start Date End Date Taking? Authorizing Provider  ACCU-CHEK AVIVA PLUS test strip USE TO TEST BLOOD SUGAR UP TO 3 TIMES A DAY. 03/17/23   Shamleffer, Ibtehal Jaralla, MD  Accu-Chek Softclix Lancets lancets Use as instructed 06/15/23   [name], MD  acetaminophen  (TYLENOL ) 500 MG tablet Take 1,000 mg by mouth every 6 (six) hours as needed for moderate pain.    [provider]  acetaminophen -codeine (TYLENOL  #3) 300-30 MG tablet Take 1 tablet by mouth every 6 (six) hours. 05/24/23   [provider]  aspirin  81 MG chewable tablet Chew 81 mg by mouth daily.    [provider]   atorvastatin  (LIPITOR) 40 MG tablet TAKE 1 TABLET BY MOUTH EVERY DAY 01/08/24   [name], MD  Brexpiprazole  (REXULTI ) 0.5 MG TABS Take 1 tablet (0.5 mg) daily for 7 days. Then, increase to 2 tablets (1 mg) daily. 12/04/23   Dameron, Marisa, DO  brimonidine  (ALPHAGAN ) 0.2 % ophthalmic solution Place 1 drop into the left eye 2 (two) times daily. 03/30/21   [provider]  diclofenac  Sodium (VOLTAREN ) 1 % GEL Apply 2 g topically 4 (four) times daily. Apply over the affected area.  Please avoid the genital areas. 10/03/23   Goble Last, MD  dorzolamide -timolol  (COSOPT ) 22.3-6.8 MG/ML ophthalmic solution Place 1 drop into both eyes 2 (two) times daily. 05/18/20   [provider]  doxepin  (SINEQUAN ) 10 MG/ML solution Take 1 mL (10 mg total) by mouth at bedtime as needed for sleep. 05/05/22   McDiarmid, Demetra Filter, MD  famotidine  (PEPCID ) 20 MG tablet TAKE 1 TABLET BY MOUTH EVERY DAY 12/05/23   [name], MD  FEROSUL 325 (65 Fe) MG tablet TAKE 1 TABLET BY MOUTH DAILY WITH BREAKFAST 10/11/23   [name], MD  insulin  glargine (LANTUS  SOLOSTAR) 100 UNIT/ML Solostar Pen Inject 8 Units into the skin daily. 10/31/23   Shamleffer, Ibtehal Jaralla, MD  Insulin  Pen Needle 32G X 4 MM MISC 1 Device by Does not apply route daily in the afternoon. 10/31/23   Shamleffer, Ibtehal Jaralla, MD  levothyroxine  (SYNTHROID ) 50  MCG tablet Take 1 tablet (50 mcg total) by mouth daily. 11/01/23   Shamleffer, Ibtehal Jaralla, MD  metFORMIN  (GLUCOPHAGE -XR) 500 MG 24 hr tablet Take 2 tablets (1,000 mg total) by mouth daily with breakfast. 10/31/23   Shamleffer, Ibtehal Jaralla, MD  metoprolol  succinate (TOPROL -XL) 25 MG 24 hr tablet TAKE 1 TABLET BY MOUTH DAILY 12/11/23   [name], MD  Misc. Devices (TRANSFER BOARD) MISC Use as needed for transfer from bed to chair or vice versa 02/25/21   Sheril Dines, MD  MYRBETRIQ  25 MG TB24 tablet TAKE 1 TABLET BY MOUTH EVERY DAY 12/05/23   [name], MD   polyethylene glycol powder (GLYCOLAX /MIRALAX ) 17 GM/SCOOP powder Take 17 g by mouth in the morning and at bedtime. Patient taking differently: Take 17 g by mouth 2 (two) times daily as needed for mild constipation. 08/20/21   [name], MD  psyllium (METAMUCIL) 58.6 % packet Take 1 packet by mouth daily. 10/31/23   [name], MD  SARNA lotion Apply 1 application  topically 4 (four) times daily as needed for itching (affected areas). 05/27/21   [provider]  senna (SENOKOT) 8.6 MG TABS tablet Take 1 tablet (8.6 mg total) by mouth 2 (two) times daily. Patient taking differently: Take 1 tablet by mouth daily as needed for mild constipation. 08/09/21   Limmie Ren, MD  tamsulosin  (FLOMAX ) 0.4 MG CAPS capsule TAKE 1 CAPSULE BY MOUTH EVERY DAY 12/26/23   [name], MD  VYZULTA  0.024 % SOLN Place 1 drop into both eyes at bedtime.    [provider]      Allergies    Tramadol  and Levemir  [insulin  detemir]    Review of Systems   Review of Systems  Unable to perform ROS: Dementia    Physical Exam Updated Vital Signs BP (!) 220/120   Pulse (!) 108   Temp 97.9 F (36.6 C) (Oral)   Resp 20   Ht 5\' 2"  (1.575 m)   Wt 59 kg   SpO2 100%   BMI 23.79 kg/m  Physical Exam Vitals and nursing note reviewed.  Constitutional:      Appearance: She is well-developed.  HENT:     Head: Normocephalic and atraumatic.  Eyes:     Extraocular Movements: Extraocular movements intact.     Pupils: Pupils are equal, round, and reactive to light.  Cardiovascular:     Rate and Rhythm: Normal rate and regular rhythm.     Heart sounds: Normal heart sounds.  Pulmonary:     Effort: Pulmonary effort is normal.     Breath sounds: Normal breath sounds.  Abdominal:     Palpations: Abdomen is soft.     Tenderness: There is no abdominal tenderness.  Skin:    General: Skin is warm and dry.  Neurological:     Mental Status: She is alert.     Comments: Equal strength in  all 4 extremities. No facial droop or slurred speech.     ED Results / Procedures / Treatments   Labs (all labs ordered are listed, but only abnormal results are displayed) Labs Reviewed  URINALYSIS, ROUTINE W REFLEX MICROSCOPIC - Abnormal; Notable for the following components:      Result Value   APPearance CLOUDY (*)    Hgb urine dipstick SMALL (*)    Nitrite POSITIVE (*)    Leukocytes,Ua LARGE (*)    Bacteria, UA RARE (*)    All other components within normal limits  COMPREHENSIVE METABOLIC PANEL WITH  GFR - Abnormal; Notable for the following components:   Glucose, Bld 223 (*)    Calcium  8.8 (*)    Total Protein 6.0 (*)    All other components within normal limits  CBG MONITORING, ED - Abnormal; Notable for the following components:   Glucose-Capillary 128 (*)    All other components within normal limits  CBG MONITORING, ED - Abnormal; Notable for the following components:   Glucose-Capillary 377 (*)    All other components within normal limits  CBC WITH DIFFERENTIAL/PLATELET  CBG MONITORING, ED  CBG MONITORING, ED    EKG EKG Interpretation Date/Time:  Thursday January 25 2024 11:59:52 EDT Ventricular Rate:  79 PR Interval:  42 QRS Duration:  179 QT Interval:  384 QTC Calculation: 441 R Axis:   -49  Text Interpretation: Sinus rhythm Ventricular premature complex Short PR interval Nonspecific IVCD with LAD Left ventricular hypertrophy Inferior infarct, old Anterior Q waves, possibly due to LVH Artifact in lead(s) I II III aVR aVL aVF V1 V2 Confirmed by Jerilynn Montenegro [phone]) on 01/25/2024 2:58:47 PM  Radiology DG Chest Port 1 View Result Date: 01/25/2024 CLINICAL DATA:  Weakness. EXAM: PORTABLE CHEST 1 VIEW COMPARISON:  06/26/2023. FINDINGS: Low lung volumes. The heart size and mediastinal contours are within normal limits. Mild left greater than right bibasilar scarring/atelectasis. No focal consolidation, sizeable pleural effusion, or pneumothorax. Diffuse osseous  demineralization. No acute osseous abnormality. IMPRESSION: Low lung volumes. Mild left greater than right bibasilar scarring/atelectasis. Electronically Signed   By: Mannie Seek M.D.   On: 01/25/2024 12:37    Procedures Procedures    Medications Ordered in ED Medications  sodium chloride  flush (NS) 0.9 % injection 3 mL (3 mLs Intravenous Given 01/25/24 1212)  sodium chloride  flush (NS) 0.9 % injection 3 mL (has no administration in time range)  0.9 %  sodium chloride  infusion (has no administration in time range)  metoprolol  succinate (TOPROL -XL) 24 hr tablet 25 mg (25 mg Oral Given 01/25/24 1619)  amLODipine  (NORVASC ) tablet 5 mg (5 mg Oral Given 01/25/24 1619)  cephALEXin  (KEFLEX ) capsule 500 mg (500 mg Oral Given 01/25/24 1619)  acetaminophen  (TYLENOL ) tablet 650 mg (650 mg Oral Given 01/25/24 1619)    ED Course/ Medical Decision Making/ A&P                                 Medical Decision Making Amount and/or Complexity of Data Reviewed Labs: ordered. Radiology: ordered.  Risk OTC drugs. Prescription drug management.   Patient's glucoses have been stable here.  There is a possible UTI though hard to tell due to her catheter.  However with family's concern, we will put her on some antibiotics.  She has been hypertensive here and family states that she was taken off of her blood pressure medicines.  Most most recently on some metoprolol  and amlodipine  and her blood pressure is going back up the part of that might be some agitation since family left.  Will give her some blood pressure meds and she will need reassessment.  If her BP comes back down, I think she has been stable enough for discharge.  Will need reevaluation.  Care transferred to Dr. Nora Beal.        Final Clinical Impression(s) / ED Diagnoses Final diagnoses:  Hypoglycemia    Rx / DC Orders ED Discharge Orders     None         Jerilynn Montenegro,  MD 01/25/24 1647

## 2024-01-25 NOTE — Discharge Instructions (Signed)
 You were seen in the emergency department for your low blood sugar.  Your blood sugars remained controlled to elevated here in the emergency department.  You were found to have a urinary tract infection which likely caused your low blood sugars.  You can continue to take your medication as prescribed and make sure that you are eating appropriately.  I been given you a prescription of antibiotics he should complete this as prescribed.  Your blood pressure was also significantly elevated in the ER and part of it may have been related to pain.  I have given you a blood pressure medication that you should start daily and you can follow-up with your primary doctor for blood pressure recheck.  You can take Tylenol  as needed for pain.  You should follow-up with your primary doctor in the next few days.  You should return to the emergency department if you are having recurrent low blood sugars, fevers despite the antibiotics, vomiting and cannot tolerate the antibiotics or any other new or concerning symptoms.

## 2024-01-25 NOTE — ED Triage Notes (Addendum)
 Patient arrives via [facility] for hypoglycemia, lives with daughter. CBG 47 this am, given limeade and peanut butter jelly, CBG up to 90. Dementia hx, slurred speech at first now resolved. Alert and oriented x3. 20 RAC.   EMS VSS.  BP 200/100 Patient has not had morning meds.   Urinary catheter in place

## 2024-01-30 ENCOUNTER — Encounter: Payer: Self-pay | Admitting: *Deleted

## 2024-01-31 ENCOUNTER — Ambulatory Visit (INDEPENDENT_AMBULATORY_CARE_PROVIDER_SITE_OTHER): Admitting: Podiatry

## 2024-01-31 ENCOUNTER — Encounter: Payer: Self-pay | Admitting: Podiatry

## 2024-01-31 DIAGNOSIS — M79674 Pain in right toe(s): Secondary | ICD-10-CM | POA: Diagnosis not present

## 2024-01-31 DIAGNOSIS — B351 Tinea unguium: Secondary | ICD-10-CM

## 2024-01-31 DIAGNOSIS — M79675 Pain in left toe(s): Secondary | ICD-10-CM | POA: Diagnosis not present

## 2024-01-31 DIAGNOSIS — E1140 Type 2 diabetes mellitus with diabetic neuropathy, unspecified: Secondary | ICD-10-CM | POA: Diagnosis not present

## 2024-01-31 DIAGNOSIS — Z794 Long term (current) use of insulin: Secondary | ICD-10-CM

## 2024-01-31 NOTE — Progress Notes (Signed)
 This patient returns to my office for at risk foot care.  This patient requires this care by a professional since this patient will be at risk due to having diabetes.  This patient is unable to cut nails herself since the patient cannot reach her nails.These nails are painful walking and wearing shoes.  She presents to the office with her son  in a wheelchair.   This patient presents for at risk foot care today.  General Appearance  Alert, conversant and in no acute stress.  Vascular  Dorsalis pedis and posterior tibial  pulses are  weakly palpable  bilaterally.  Capillary return is within normal limits  bilaterally. Temperature is within normal limits  bilaterally.  Neurologic  Senn-Weinstein monofilament wire test within normal limits  bilaterally. Muscle power within normal limits bilaterally.  Nails Thick disfigured discolored nails with subungual debris  from hallux to fifth toes bilaterally. No evidence of bacterial infection or drainage bilaterally.  Orthopedic  No limitations of motion  feet .  No crepitus or effusions noted.  No bony pathology or digital deformities noted.  Skin  normotropic skin with no porokeratosis noted bilaterally.  No signs of infections or ulcers noted.     Onychomycosis  Pain in right toes  Pain in left toes  Consent was obtained for treatment procedures.   Mechanical debridement of nails 1-5  bilaterally performed with a nail nipper.  Filed with dremel without incident. Cauterized fourth toe left foot due to iatrogenic lesion.  Cauterize fifth toe left foot nail bed.    Return office visit   4  months                   Told patient to return for periodic foot care and evaluation due to potential at risk complications.   Helane Gunther DPM

## 2024-02-02 ENCOUNTER — Telehealth: Payer: Self-pay | Admitting: Student

## 2024-02-02 DIAGNOSIS — E113313 Type 2 diabetes mellitus with moderate nonproliferative diabetic retinopathy with macular edema, bilateral: Secondary | ICD-10-CM | POA: Diagnosis not present

## 2024-02-02 NOTE — Telephone Encounter (Signed)
 Patient's daughter dropped off FMLA paperwork to be completed. Last DOS was 12/04/23. Placed in [employer].

## 2024-02-05 NOTE — Telephone Encounter (Signed)
 Placed some forms in your box that the patient needs completed. Patient has an appointment on 06/19 @230  to see you.

## 2024-02-08 ENCOUNTER — Ambulatory Visit (INDEPENDENT_AMBULATORY_CARE_PROVIDER_SITE_OTHER): Admitting: Student

## 2024-02-08 ENCOUNTER — Encounter: Payer: Self-pay | Admitting: Student

## 2024-02-08 VITALS — BP 114/69 | HR 75 | Ht 62.0 in

## 2024-02-08 DIAGNOSIS — R4182 Altered mental status, unspecified: Secondary | ICD-10-CM

## 2024-02-08 DIAGNOSIS — E1140 Type 2 diabetes mellitus with diabetic neuropathy, unspecified: Secondary | ICD-10-CM | POA: Diagnosis not present

## 2024-02-08 DIAGNOSIS — M25571 Pain in right ankle and joints of right foot: Secondary | ICD-10-CM

## 2024-02-08 DIAGNOSIS — F03A2 Unspecified dementia, mild, with psychotic disturbance: Secondary | ICD-10-CM | POA: Diagnosis not present

## 2024-02-08 DIAGNOSIS — Z794 Long term (current) use of insulin: Secondary | ICD-10-CM

## 2024-02-08 DIAGNOSIS — I10 Essential (primary) hypertension: Secondary | ICD-10-CM

## 2024-02-08 MED ORDER — LANTUS SOLOSTAR 100 UNIT/ML ~~LOC~~ SOPN: 6.0000 [IU] | PEN_INJECTOR | Freq: Every day | SUBCUTANEOUS | 3 refills | Status: DC

## 2024-02-08 NOTE — Assessment & Plan Note (Signed)
 Progressive worsening with increased confusion, difficulty swallowing, and nighttime agitation. No infection or fever. Increased daytime sleepiness reported by family. Decline may be due to metabolic issues or dementia progression. - Discuss potential use of mood stabilizers for nighttime agitation (currently on brexpiprazole  and not helping-will message Dr. McDiarmid for additional thoughts) - Consider baseline labs including CBC and BMP TSH to rule out reversible causes - Discuss goals of care with family, considering comfort measures and quality of life in future - Advance directives form given to fill out - F/u with Dr. McDiarmid/geriatric clinic

## 2024-02-08 NOTE — Progress Notes (Cosign Needed)
 SUBJECTIVE:   CHIEF COMPLAINT / HPI: ED F/u  Discussed the use of AI scribe software for clinical note transcription with the patient, who gave verbal consent to proceed.  Dx mild dementia 2023 MoCA 18 ED visit on 01/25/2024 for hypoglycemia-blood sugar in the 40s which improved with juice and return to baseline quickly was hypertensive during this.  Did have a UTI and was treated with Keflex .  No urine culture was collected at that time. History of Present Illness Hayley Fisher is a 89 year old female with dementia and diabetes who presents for a hospital follow-up after an episode of hypoglycemia. She is accompanied by her daughter, who is her primary caregiver.  Her blood sugar levels have been fluctuating since the hypoglycemic episode, with recent readings of 93, 147, and 189. She is on 8 units of insulin , and her blood sugar exceeded 200 3 days after her ED visit . She is eating less, especially later in the day, and has difficulty swallowing pills.  Her dementia is worsening, with increased confusion and episodes of yelling, particularly at night.  She is also seeing hallucinations of her family members who have passed away as well.  She is more fatigued and sleeps frequently during the day.  Was started on Brexpiprazole  but does not feel this has helped  She was recently treated for a urinary tract infection and has a suprapubic catheter, changed every 30 days, with no recent fevers.  She has right ankle pain for the past two days, more pronounced at night, with no falls or injuries. Her daughter applies a pain reliever rub to the area.  Her medications include blood pressure medication, restarted after recent elevation, and regular Tylenol  for pain. Doxepin  was discontinued due to side effects.   PERTINENT  PMH / PSH: Hypertension, DVT, T2DM, hypothyroidism, mild dementia, chronic suprapubic catheter  OBJECTIVE:   BP 114/69   Pulse 75   Ht 5' 2 (1.575 m)   SpO2 97%   BMI  23.79 kg/m   General: NAD, intermittently nodding off to sleep but easily arousable, responsive to questions Head: Normocephalic atraumatic CV: Regular rate and rhythm no murmurs rubs or gallops Respiratory: Clear to ausculation bilaterally, no wheezes rales or crackles, chest rises symmetrically,  no increased work of breathing Abdomen: Soft, non-tender, non-distended, normoactive bowel sounds  Extremities: Moves upper and lower extremities freely, mild medial malleolar tenderness and slight bruising over right ankle    ASSESSMENT/PLAN:   Assessment & Plan Mild dementia with psychotic disturbance, unspecified dementia type (HCC) Progressive worsening with increased confusion, difficulty swallowing, and nighttime agitation. No infection or fever. Increased daytime sleepiness reported by family. Decline may be due to metabolic issues or dementia progression. - Discuss potential use of mood stabilizers for nighttime agitation (currently on brexpiprazole  and not helping-will message Dr. McDiarmid for additional thoughts) - Consider baseline labs including CBC and BMP TSH to rule out reversible causes - Discuss goals of care with family, considering comfort measures and quality of life in future - Advance directives form given to fill out - F/u with Dr. McDiarmid/geriatric clinic Type 2 diabetes mellitus with diabetic neuropathy, with long-term current use of insulin  (HCC) Recent episode with blood sugar at 47. Current readings are 93, 147, and 189.  Poor p.o. intake, likely secondary to progressive dementia we will plan to reduce insulin . - Reduce insulin  dosage from 8 units to 6 units. - Monitor blood glucose levels closely. Hypertension, unspecified type Recently restarted on amlodipine  due to  elevated blood pressure. Current regimen effectively maintains blood pressure. - Continue amlodipine  Acute right ankle pain Pain for two days, more pronounced at night. No trauma history. Ankle  appears slightly bruised. Topical analgesics used for pain management. - Apply topical analgesics to the affected area + Tylenol  - Monitor for changes or worsening of symptoms, conisder imaging if worsening   Hayley Kidd, MD [facility]

## 2024-02-08 NOTE — Patient Instructions (Addendum)
 It was great to see you! Thank you for allowing me to participate in your care!   Our plans for today:  - I am providing advanced care ppapers to have this in the chart - I am also concerned about this progressive decline that has started, I believe part of this is due to dementia but I would get some labs to rule out any reversible causes - I would recommend going down to 6 units of insulin  from 8 in order to prevent hypoglycemia - If the ankle pain continues for longer let me know and I will order some imaging but for now I recommend Tylenol  and rest/compresses and an ice  Take care and seek immediate care sooner if you develop any concerns.  [name], MD

## 2024-02-09 ENCOUNTER — Ambulatory Visit: Payer: Self-pay | Admitting: Student

## 2024-02-09 ENCOUNTER — Telehealth: Payer: Self-pay | Admitting: Student

## 2024-02-09 DIAGNOSIS — R4189 Other symptoms and signs involving cognitive functions and awareness: Secondary | ICD-10-CM

## 2024-02-09 LAB — CBC WITH DIFFERENTIAL/PLATELET
Basophils Absolute: 0 10*3/uL (ref 0.0–0.2)
Basos: 1 %
EOS (ABSOLUTE): 0.1 10*3/uL (ref 0.0–0.4)
Eos: 1 %
Hematocrit: 36.1 % (ref 34.0–46.6)
Hemoglobin: 11 g/dL — ABNORMAL LOW (ref 11.1–15.9)
Immature Grans (Abs): 0 10*3/uL (ref 0.0–0.1)
Immature Granulocytes: 0 %
Lymphocytes Absolute: 0.7 10*3/uL (ref 0.7–3.1)
Lymphs: 17 %
MCH: 30.2 pg (ref 26.6–33.0)
MCHC: 30.5 g/dL — ABNORMAL LOW (ref 31.5–35.7)
MCV: 99 fL — ABNORMAL HIGH (ref 79–97)
Monocytes Absolute: 0.4 10*3/uL (ref 0.1–0.9)
Monocytes: 8 %
Neutrophils Absolute: 3.1 10*3/uL (ref 1.4–7.0)
Neutrophils: 72 %
Platelets: 207 10*3/uL (ref 150–450)
RBC: 3.64 x10E6/uL — ABNORMAL LOW (ref 3.77–5.28)
RDW: 12.6 % (ref 11.7–15.4)
WBC: 4.3 10*3/uL (ref 3.4–10.8)

## 2024-02-09 LAB — COMPREHENSIVE METABOLIC PANEL WITH GFR
ALT: 6 IU/L (ref 0–32)
AST: 12 IU/L (ref 0–40)
Albumin: 3.9 g/dL (ref 3.6–4.6)
Alkaline Phosphatase: 59 IU/L (ref 44–121)
BUN/Creatinine Ratio: 21 (ref 12–28)
BUN: 20 mg/dL (ref 10–36)
Bilirubin Total: 0.4 mg/dL (ref 0.0–1.2)
CO2: 22 mmol/L (ref 20–29)
Calcium: 9.1 mg/dL (ref 8.7–10.3)
Chloride: 101 mmol/L (ref 96–106)
Creatinine, Ser: 0.96 mg/dL (ref 0.57–1.00)
Globulin, Total: 2 g/dL (ref 1.5–4.5)
Glucose: 166 mg/dL — ABNORMAL HIGH (ref 70–99)
Potassium: 4.6 mmol/L (ref 3.5–5.2)
Sodium: 140 mmol/L (ref 134–144)
Total Protein: 5.9 g/dL — ABNORMAL LOW (ref 6.0–8.5)
eGFR: 56 mL/min/{1.73_m2} — ABNORMAL LOW (ref >59)

## 2024-02-09 LAB — TSH: TSH: 2.68 u[IU]/mL (ref 0.450–4.500)

## 2024-02-09 MED ORDER — BREXPIPRAZOLE 1 MG PO TABS: 2.0000 mg | ORAL_TABLET | Freq: Every day | ORAL | 0 refills | Status: DC

## 2024-02-09 NOTE — Telephone Encounter (Signed)
 Spoke with attending Dr. McDiarmid about patient's sundowning medication regimen. Currently on 1 mg daily of Brexpiprazole .  There is room to increase her Brexiprazole. Consider increase to 2 mg daily. I would consider this first as she is having hallucinations. If agitation and/or hallucination, may increase by 1 mg a week to maximum of 4 mg daily dose.  Citalopram has th next best evidence for treatment of agitation.  Third line is donepezil or gabapentin  or divalproex at night.  Called daughter Camelia Cavalier and confirmed name and DOB of patient.  Discussed recommendations with her.  After discussion we agreed to increase Rexulti  dosage to 2 mg daily, if patient is still having hallucinations and agitation to message us  and we can increase to 3 mg, can increase by 1 mg a week for maximum dosage of 4 mg daily.  She request that I call [facility] to see if this dosage change can be done prior to home delivery today.  I did call from the pharmacy and they let me know I can place prescription in and they will help with this change.  I requested 1 mg tabs instead of the 0.5 mg tabs.  If we find a stable dosage we can do a higher dose tab instead of the 1 mg tabs as patient has difficulty swallowing pills at this time and this would help consolidate things.  Discussed that labs overall stable.

## 2024-02-12 ENCOUNTER — Telehealth: Payer: Self-pay

## 2024-02-12 NOTE — Telephone Encounter (Signed)
 Form placed up front for pick up.   Copy made for batch scanning.  Copy placed in faxed pile.   Daughter has been made aware.

## 2024-02-12 NOTE — Telephone Encounter (Addendum)
 Pharmacy Patient Advocate Encounter   Received notification from CoverMyMeds that prior authorization for REXULTI  1MG  is required/requested.   Insurance verification completed.   The patient is insured through [facility] .   PA required; PA submitted to above mentioned insurance via CoverMyMeds Key/confirmation #/EOC [employer]. Status is pending

## 2024-02-13 NOTE — Telephone Encounter (Signed)
 Completed addtl info form, and faxed back to [facility].

## 2024-02-13 NOTE — Telephone Encounter (Signed)
 Rec'd second additional info fax from patients insurance in regards to Rexulti  PA.   Per insurance:

## 2024-02-14 NOTE — Telephone Encounter (Signed)
 Spoke with [employer] in regards to prior authorization.  Information given in regards to titration.   She reports she has added this to determination notes.   She reports we should receive a fax determination in a few days.   Case number: [id number]

## 2024-02-14 NOTE — Telephone Encounter (Signed)
 Pharmacy Patient Advocate Encounter  Received notification from [employer] that Prior Authorization for Rexulti  1MG  has been APPROVED from 02/14/24 to 08/21/24  REXULTI  TAB 1MG , use as directed (2 tablet(s) per day), is approved through 08/21/2024 under your Medicare Part D benefit.  **Please note: REXULTI  TAB 2MG  is commercially available and will process at your pharmacy for up to 1 tablet per day. Please consider switching to this commercially available dose.  PA #/Case ID/Reference #: [id number]

## 2024-02-20 DIAGNOSIS — R3914 Feeling of incomplete bladder emptying: Secondary | ICD-10-CM | POA: Diagnosis not present

## 2024-02-27 ENCOUNTER — Other Ambulatory Visit: Payer: Self-pay

## 2024-02-28 ENCOUNTER — Other Ambulatory Visit: Payer: Self-pay

## 2024-02-28 DIAGNOSIS — R4189 Other symptoms and signs involving cognitive functions and awareness: Secondary | ICD-10-CM

## 2024-02-28 MED ORDER — AMLODIPINE BESYLATE 5 MG PO TABS: 5.0000 mg | ORAL_TABLET | Freq: Every day | ORAL | 3 refills | Status: DC

## 2024-02-28 MED ORDER — TAMSULOSIN HCL 0.4 MG PO CAPS: 0.4000 mg | ORAL_CAPSULE | Freq: Every day | ORAL | 1 refills | Status: DC

## 2024-02-28 NOTE — Telephone Encounter (Signed)
 Will refill tamsulosin  for now. Based on last sent rx for ferrous sulfate  and brexpiprazole  patient should still have pills left.   Hey admin team, could you please schedule this patient for an appointment soon to discuss medications.

## 2024-03-01 ENCOUNTER — Telehealth: Payer: Self-pay

## 2024-03-01 NOTE — Telephone Encounter (Signed)
 Received call from [facility] RN regarding patient. She reports family has concerns with worsening dementia and sundowning.   Physical decline- she is not able to stand on her own and is having issues with transferring. Two person assist, struggling to feed herself. Concerned with aspiration risks.   Requesting speech therapy, PT/ OT evaluation. Provided verbal orders for these evaluations.   Patient does have a home health aide. However, they are wanting to see if patient would qualify for additional home services.   Small, possible stage 1 sacral ulcer. They are using a cushion in wheelchair to alleviate pressure. HH RN would need specific orders if this needs to be treated in the home.   Called patient's daughter to schedule follow up visit to discuss concerns and evaluate sacral area.   Scheduled on Wednesday morning, per daughter's schedule with Dr. Romelle, as PCP does not have availability until the end of the month.   Chiquita JAYSON English, RN

## 2024-03-04 DIAGNOSIS — E1140 Type 2 diabetes mellitus with diabetic neuropathy, unspecified: Secondary | ICD-10-CM | POA: Diagnosis not present

## 2024-03-04 DIAGNOSIS — R1312 Dysphagia, oropharyngeal phase: Secondary | ICD-10-CM | POA: Diagnosis not present

## 2024-03-04 DIAGNOSIS — E1169 Type 2 diabetes mellitus with other specified complication: Secondary | ICD-10-CM | POA: Diagnosis not present

## 2024-03-05 ENCOUNTER — Telehealth: Payer: Self-pay

## 2024-03-05 NOTE — Telephone Encounter (Signed)
 Patient's daughter calls nurse line requesting to schedule appointment for [facility].   Per note from Dr. Christia on 02/08/24, she wanted patient to receive evaluation in geri clinic.   Advised family that I would reach out to [place] regarding scheduling.   Patient has another visit tomorrow with Dr. Romelle. Advised that I would see if appointment information could be provided at this visit tomorrow.   Hayley Fisher English, RN

## 2024-03-06 ENCOUNTER — Ambulatory Visit (INDEPENDENT_AMBULATORY_CARE_PROVIDER_SITE_OTHER): Admitting: Family Medicine

## 2024-03-06 ENCOUNTER — Telehealth: Payer: Self-pay

## 2024-03-06 ENCOUNTER — Other Ambulatory Visit: Payer: Self-pay | Admitting: Internal Medicine

## 2024-03-06 ENCOUNTER — Other Ambulatory Visit: Payer: Self-pay | Admitting: Family Medicine

## 2024-03-06 VITALS — BP 140/82 | HR 81

## 2024-03-06 DIAGNOSIS — L89152 Pressure ulcer of sacral region, stage 2: Secondary | ICD-10-CM

## 2024-03-06 DIAGNOSIS — R1312 Dysphagia, oropharyngeal phase: Secondary | ICD-10-CM | POA: Diagnosis not present

## 2024-03-06 DIAGNOSIS — E1140 Type 2 diabetes mellitus with diabetic neuropathy, unspecified: Secondary | ICD-10-CM | POA: Diagnosis not present

## 2024-03-06 DIAGNOSIS — F03A2 Unspecified dementia, mild, with psychotic disturbance: Secondary | ICD-10-CM

## 2024-03-06 DIAGNOSIS — E1169 Type 2 diabetes mellitus with other specified complication: Secondary | ICD-10-CM | POA: Diagnosis not present

## 2024-03-06 MED ORDER — LIDOCAINE-PRILOCAINE 2.5-2.5 % EX CREA: 1.0000 | TOPICAL_CREAM | CUTANEOUS | 0 refills | Status: AC | PRN

## 2024-03-06 MED ORDER — LIDOCAINE-PRILOCAINE 2.5-2.5 % EX CREA
1.0000 | TOPICAL_CREAM | CUTANEOUS | 0 refills | Status: DC | PRN
Start: 2024-03-06 — End: 2024-03-06

## 2024-03-06 MED ORDER — DUODERM CGF EXTRA THIN EX MISC: 1.0000 | CUTANEOUS | 3 refills | Status: DC | PRN

## 2024-03-06 NOTE — Telephone Encounter (Signed)
 [facility] calls nurse line in regards to Lidocaine  prescription.   She reports for billing and insurance purposes  the prescription must state the days supply.   She is requesting a new script reflecting this.   Will forward to provider who saw patient for concern.

## 2024-03-06 NOTE — Progress Notes (Signed)
    SUBJECTIVE:  Son-in-law present and he calls pt's daughter on the phone for additional information.   CHIEF COMPLAINT / HPI:   Bump on bottom Pt reports this has been there for 10 years, but now is painful. Family shares that this is a more recent development; Sacral ulcer stage 1 noted previously by [facility] aide. Family reports she is complaining about this place more and more frequently. Pt agrees that she has pain to her bottom. She is unable to ambulate and spend most of the day sitting up in chair, though family does excellent job of helping her change positions frequently. However she is often uncomfortable in other positions and so returns to sitting.  Does use cushion to offload.  [facility] Family interested in pursuing cognitive testing/additional evaluation for worsening dementia. They would like to schedule with our clinic.  PERTINENT  PMH / PSH: Reviewed.  OBJECTIVE:   BP (!) 140/82   Pulse 81   SpO2 99%   General: Elderly-appearing, pleasant, no acute distress. HEENT: normocephalic, EOM grossly intact, MMM. Pulm: No increased work of breathing. Neuro: speech normal in content, no facial asymmetry. Psych:  Alert, communicative, and cooperative.  Superior aspect of gluteal cleft with mild skin breakdown to dermis, no subcutaneous fat visible. No bleeding or drainage.    ASSESSMENT/PLAN:   Assessment & Plan Pressure ulcer of sacral region, stage 2 (HCC) Mild breakdown of the dermis but no evidence of infection. Should heal well if pt is able to offload, though this may be difficult for her.  - DuoDerm dressing applied today with Dr. Gerrit assistance and family instructed on how to change this at home every 3-5 days; supplies ordered - home health nursing ordered to help with weekly dressing change - PT/OT referral Mild dementia with psychotic disturbance, unspecified dementia type [facility]) Per family request pt is scheduled for our Geriatric clinic next month  to evaluate.    Lauraine Norse, DO [place] [facility]

## 2024-03-06 NOTE — Assessment & Plan Note (Signed)
 Per family request pt is scheduled for our Geriatric clinic next month to evaluate.

## 2024-03-06 NOTE — Patient Instructions (Signed)
 It was so good to see you today! Thank you for allowing me to take care of you.  Today we discussed the following concerns and plans:  Sacral Pressure Ulcer - Clean area gently with warm water - May place a small amount of Bacitracin antibiotic ointment - cover with DuoDerm dressing (this should stick on gently) - if needed, may cover DuoDerm with gauze to help it stay in place - change dressing every 3-5 days, or sooner if soiled - you may use Lidocaine  (EMLA  cream) to the area to help with pain  I have put in orders for Home Health nursing to come out to your home to help with dressing changes. I have also ordered PT and OT.  If you have any concerns, please call the clinic or schedule an appointment.  It was a pleasure to take care of you today. Be well!  Lauraine Norse, DO [facility], PGY-2  Don't forget to check out the [facility] in the [facility] at [address] [phone] Affordable prices on prescriptions and over-the-counter items, as well as services like vaccinations and medication home delivery.

## 2024-03-06 NOTE — Telephone Encounter (Signed)
 Spoke with patients son in law while in clinic. Made Geri appt for Aug 14th at 1:30 packet was given while in clinic. Nelson Land, CMA

## 2024-03-07 ENCOUNTER — Other Ambulatory Visit: Payer: Self-pay

## 2024-03-07 DIAGNOSIS — E1169 Type 2 diabetes mellitus with other specified complication: Secondary | ICD-10-CM | POA: Diagnosis not present

## 2024-03-07 DIAGNOSIS — R1312 Dysphagia, oropharyngeal phase: Secondary | ICD-10-CM | POA: Diagnosis not present

## 2024-03-07 DIAGNOSIS — E1140 Type 2 diabetes mellitus with diabetic neuropathy, unspecified: Secondary | ICD-10-CM | POA: Diagnosis not present

## 2024-03-07 DIAGNOSIS — R350 Frequency of micturition: Secondary | ICD-10-CM

## 2024-03-07 MED ORDER — MIRABEGRON ER 25 MG PO TB24: 25.0000 mg | ORAL_TABLET | Freq: Every day | ORAL | 1 refills | Status: DC

## 2024-03-07 MED ORDER — METOPROLOL SUCCINATE ER 25 MG PO TB24: 25.0000 mg | ORAL_TABLET | Freq: Every day | ORAL | 0 refills | Status: DC

## 2024-03-11 ENCOUNTER — Emergency Department (HOSPITAL_COMMUNITY)
Admission: EM | Admit: 2024-03-11 | Discharge: 2024-03-11 | Disposition: A | Discharge status: Home / Self Care | Attending: Emergency Medicine | Admitting: Emergency Medicine

## 2024-03-11 ENCOUNTER — Telehealth: Payer: Self-pay

## 2024-03-11 ENCOUNTER — Other Ambulatory Visit: Payer: Self-pay

## 2024-03-11 ENCOUNTER — Telehealth (HOSPITAL_COMMUNITY): Payer: Self-pay

## 2024-03-11 DIAGNOSIS — Z794 Long term (current) use of insulin: Secondary | ICD-10-CM | POA: Diagnosis not present

## 2024-03-11 DIAGNOSIS — R739 Hyperglycemia, unspecified: Secondary | ICD-10-CM | POA: Diagnosis not present

## 2024-03-11 DIAGNOSIS — L89153 Pressure ulcer of sacral region, stage 3: Secondary | ICD-10-CM | POA: Diagnosis not present

## 2024-03-11 DIAGNOSIS — Z7982 Long term (current) use of aspirin: Secondary | ICD-10-CM | POA: Insufficient documentation

## 2024-03-11 DIAGNOSIS — N3000 Acute cystitis without hematuria: Secondary | ICD-10-CM | POA: Insufficient documentation

## 2024-03-11 DIAGNOSIS — E119 Type 2 diabetes mellitus without complications: Secondary | ICD-10-CM | POA: Diagnosis not present

## 2024-03-11 DIAGNOSIS — Z7401 Bed confinement status: Secondary | ICD-10-CM | POA: Diagnosis not present

## 2024-03-11 DIAGNOSIS — E039 Hypothyroidism, unspecified: Secondary | ICD-10-CM | POA: Insufficient documentation

## 2024-03-11 DIAGNOSIS — I10 Essential (primary) hypertension: Secondary | ICD-10-CM | POA: Diagnosis not present

## 2024-03-11 DIAGNOSIS — Z7984 Long term (current) use of oral hypoglycemic drugs: Secondary | ICD-10-CM | POA: Insufficient documentation

## 2024-03-11 DIAGNOSIS — Z743 Need for continuous supervision: Secondary | ICD-10-CM | POA: Diagnosis not present

## 2024-03-11 DIAGNOSIS — Z79899 Other long term (current) drug therapy: Secondary | ICD-10-CM | POA: Insufficient documentation

## 2024-03-11 DIAGNOSIS — R531 Weakness: Secondary | ICD-10-CM | POA: Diagnosis not present

## 2024-03-11 DIAGNOSIS — Z87891 Personal history of nicotine dependence: Secondary | ICD-10-CM | POA: Diagnosis not present

## 2024-03-11 DIAGNOSIS — I4891 Unspecified atrial fibrillation: Secondary | ICD-10-CM | POA: Diagnosis not present

## 2024-03-11 LAB — COMPREHENSIVE METABOLIC PANEL WITH GFR
ALT: 11 U/L (ref 0–44)
AST: 21 U/L (ref 15–41)
Albumin: 4 g/dL (ref 3.5–5.0)
Alkaline Phosphatase: 58 U/L (ref 38–126)
Anion gap: 9 (ref 5–15)
BUN: 18 mg/dL (ref 8–23)
CO2: 28 mmol/L (ref 22–32)
Calcium: 8.8 mg/dL — ABNORMAL LOW (ref 8.9–10.3)
Chloride: 100 mmol/L (ref 98–111)
Creatinine, Ser: 0.73 mg/dL (ref 0.44–1.00)
GFR, Estimated: >60 mL/min (ref >60)
Glucose, Bld: 257 mg/dL — ABNORMAL HIGH (ref 70–99)
Potassium: 4.4 mmol/L (ref 3.5–5.1)
Sodium: 137 mmol/L (ref 135–145)
Total Bilirubin: 1.2 mg/dL (ref 0.0–1.2)
Total Protein: 6.8 g/dL (ref 6.5–8.1)

## 2024-03-11 LAB — CBC WITH DIFFERENTIAL/PLATELET
Abs Immature Granulocytes: 0.02 K/uL (ref 0.00–0.07)
Basophils Absolute: 0 K/uL (ref 0.0–0.1)
Basophils Relative: 0 %
Eosinophils Absolute: 0 K/uL (ref 0.0–0.5)
Eosinophils Relative: 1 %
HCT: 37.8 % (ref 36.0–46.0)
Hemoglobin: 11.7 g/dL — ABNORMAL LOW (ref 12.0–15.0)
Immature Granulocytes: 1 %
Lymphocytes Relative: 20 %
Lymphs Abs: 0.8 K/uL (ref 0.7–4.0)
MCH: 30.4 pg (ref 26.0–34.0)
MCHC: 31 g/dL (ref 30.0–36.0)
MCV: 98.2 fL (ref 80.0–100.0)
Monocytes Absolute: 0.3 K/uL (ref 0.1–1.0)
Monocytes Relative: 7 %
Neutro Abs: 2.7 K/uL (ref 1.7–7.7)
Neutrophils Relative %: 71 %
Platelets: 212 K/uL (ref 150–400)
RBC: 3.85 MIL/uL — ABNORMAL LOW (ref 3.87–5.11)
RDW: 13.6 % (ref 11.5–15.5)
WBC: 3.8 K/uL — ABNORMAL LOW (ref 4.0–10.5)
nRBC: 0 % (ref 0.0–0.2)

## 2024-03-11 LAB — URINALYSIS, ROUTINE W REFLEX MICROSCOPIC
Bilirubin Urine: NEGATIVE
Glucose, UA: 150 mg/dL — AB
Ketones, ur: 5 mg/dL — AB
Nitrite: NEGATIVE
Protein, ur: 100 mg/dL — AB
Specific Gravity, Urine: 1.016 (ref 1.005–1.030)
WBC, UA: >50 WBC/hpf (ref 0–5)
pH: 6 (ref 5.0–8.0)

## 2024-03-11 MED ORDER — ACETAMINOPHEN-CODEINE 300-30 MG PO TABS: 1.0000 | ORAL_TABLET | Freq: Four times a day (QID) | ORAL | 0 refills | Status: DC | PRN

## 2024-03-11 MED ORDER — CEPHALEXIN 500 MG PO CAPS
500.0000 mg | ORAL_CAPSULE | Freq: Once | ORAL | Status: AC
  Administered 2024-03-11: 500 mg via ORAL
  Filled 2024-03-11: qty 1

## 2024-03-11 MED ORDER — CEPHALEXIN 500 MG PO CAPS: 500.0000 mg | ORAL_CAPSULE | Freq: Four times a day (QID) | ORAL | 0 refills | Status: DC

## 2024-03-11 NOTE — Telephone Encounter (Signed)
 [place] SLP calls nurse line requesting verbal order speech as follows.   2x a week for 9 weeks.  Verbal order given per [facility] protocol.

## 2024-03-11 NOTE — Telephone Encounter (Signed)
 Patients daughter calls nurse line checking on  the status of home health referral.   She reports more importantly nursing for wound care.   Advised this order has been placed, however will forward to referral coordinator for any updates.

## 2024-03-11 NOTE — Telephone Encounter (Signed)
 I spoke with the patient's daughter after she was discharged today.  She is still having pain despite Tylenol  which she has been taking.  I did discuss this with the patient's daughter.  Discussed with her that there are not great options given the patient's age and comorbid dementia.  She did state that she tolerated Tylenol  3 a few months ago and is requesting some medication here.  I do think that this is reasonable since she has tolerated this recently.  She is encouraged to return to the ER for worsening symptoms.

## 2024-03-11 NOTE — ED Provider Notes (Addendum)
 [facility] EMERGENCY DEPARTMENT AT [facility] Provider Note   CSN: [id number] Arrival date & time: 03/11/24  1317     Patient presents with: Wound Check   Hayley Fisher is a 89 y.o. female.   Patient brought in from home.  Family member here.  Patient pretty much wheelchair bedridden.  Patient's had a sacral decubitus wound for a while but has been getting worse lately.  They do have a wound care nurse coming out to the home.  Patient also does have home health nurse that comes out.  Family is most concerned because she seems to be complaining of it more they feel that they are starting to get to be some skin breakdown.  Denies any fevers.  Patient has dementia and severe arthritis.  The dementia has been getting worse.  And this week she has pretty much been in the bed nonstop.  Patient was seen by primary care doctor this week.  And they did give a special dressing.  Past history significant hypertension hyperlipidemia diabetes hypothyroidism.  Past surgical history significant inguinal hernia repair abdominal hysterectomy bladder bladder suspension.  IR IVC filter in 22 and suprapubic catheter change in January 2024.  Patient's former smoker quit in 1982.       Prior to Admission medications   Medication Sig Start Date End Date Taking? Authorizing Provider  cephALEXin  (KEFLEX ) 500 MG capsule Take 1 capsule (500 mg total) by mouth 4 (four) times daily. 03/11/24  Yes Nikya Busler, MD  ACCU-CHEK AVIVA PLUS test strip USE TO TEST BLOOD SUGAR UP TO 3 TIMES A DAY. 03/06/24   Shamleffer, Ibtehal Jaralla, MD  Accu-Chek Softclix Lancets lancets Use as instructed 06/15/23   Christia Budds, MD  acetaminophen  (TYLENOL ) 500 MG tablet Take 1,000 mg by mouth every 6 (six) hours as needed for moderate pain.    [provider]  acetaminophen -codeine  (TYLENOL  #3) 300-30 MG tablet Take 1 tablet by mouth every 6 (six) hours. 05/24/23   [provider]  amLODipine  (NORVASC )  5 MG tablet Take 1 tablet (5 mg total) by mouth daily. 02/28/24   Lennie Raguel MATSU, DO  aspirin  81 MG chewable tablet Chew 81 mg by mouth daily.    [provider]  atorvastatin  (LIPITOR) 40 MG tablet TAKE 1 TABLET BY MOUTH EVERY DAY 01/08/24   Christia Budds, MD  brexpiprazole  (REXULTI ) 1 MG TABS tablet Take 2 tablets (2 mg total) by mouth daily. 02/09/24   Christia Budds, MD  brimonidine  (ALPHAGAN ) 0.2 % ophthalmic solution Place 1 drop into the left eye 2 (two) times daily. 03/30/21   [provider]  Control Gel Formula Dressing (DUODERM CGF EXTRA THIN) MISC Apply 1 each topically every three (3) days as needed (Change every 3-5 days, or sooner if soiled.). 03/06/24   Lafe Domino, DO  diclofenac  Sodium (VOLTAREN ) 1 % GEL Apply 2 g topically 4 (four) times daily. Apply over the affected area.  Please avoid the genital areas. 10/03/23   Rosendo Rush, MD  dorzolamide -timolol  (COSOPT ) 22.3-6.8 MG/ML ophthalmic solution Place 1 drop into both eyes 2 (two) times daily. 05/18/20   [provider]  famotidine  (PEPCID ) 20 MG tablet TAKE 1 TABLET BY MOUTH EVERY DAY 12/05/23   Christia Budds, MD  FEROSUL 325 (65 Fe) MG tablet TAKE 1 TABLET BY MOUTH DAILY WITH BREAKFAST 10/11/23   Christia Budds, MD  insulin  glargine (LANTUS  SOLOSTAR) 100 UNIT/ML Solostar Pen Inject 6 Units into the skin daily. 02/08/24   Christia Budds,  MD  Insulin  Pen Needle 32G X 4 MM MISC 1 Device by Does not apply route daily in the afternoon. 10/31/23   Shamleffer, Ibtehal Jaralla, MD  levothyroxine  (SYNTHROID ) 50 MCG tablet Take 1 tablet (50 mcg total) by mouth daily. 11/01/23   Shamleffer, Ibtehal Jaralla, MD  lidocaine -prilocaine  (EMLA ) cream Apply 1 Application topically as needed (Apply to ulcer as needed for pain.). 03/06/24 04/05/24  Lafe Domino, DO  metFORMIN  (GLUCOPHAGE -XR) 500 MG 24 hr tablet Take 2 tablets (1,000 mg total) by mouth daily with breakfast. 10/31/23   Shamleffer, Ibtehal Jaralla, MD   metoprolol  succinate (TOPROL -XL) 25 MG 24 hr tablet Take 1 tablet (25 mg total) by mouth daily. 03/07/24   Lennie Raguel MATSU, DO  mirabegron  ER (MYRBETRIQ ) 25 MG TB24 tablet Take 1 tablet (25 mg total) by mouth daily. 03/07/24   Lennie Raguel MATSU, DO  Misc. Devices (TRANSFER BOARD) MISC Use as needed for transfer from bed to chair or vice versa 02/25/21   Jens Durand, MD  polyethylene glycol powder (GLYCOLAX /MIRALAX ) 17 GM/SCOOP powder Take 17 g by mouth in the morning and at bedtime. Patient taking differently: Take 17 g by mouth 2 (two) times daily as needed for mild constipation. 08/20/21   Christia Budds, MD  psyllium (METAMUCIL) 58.6 % packet Take 1 packet by mouth daily. 10/31/23   Christia Budds, MD  SARNA lotion Apply 1 application  topically 4 (four) times daily as needed for itching (affected areas). 05/27/21   [provider]  senna (SENOKOT) 8.6 MG TABS tablet Take 1 tablet (8.6 mg total) by mouth 2 (two) times daily. Patient taking differently: Take 1 tablet by mouth daily as needed for mild constipation. 08/09/21   Marlee Lynwood NOVAK, MD  tamsulosin  (FLOMAX ) 0.4 MG CAPS capsule Take 1 capsule (0.4 mg total) by mouth daily. 02/28/24   Baker, Raguel MATSU, DO  VYZULTA  0.024 % SOLN Place 1 drop into both eyes at bedtime.    [provider]    Allergies: Tramadol  and Levemir  [insulin  detemir]    Review of Systems  Unable to perform ROS: Dementia    Updated Vital Signs BP (!) 140/76 (BP Location: Left Arm)   Pulse (!) 102   Temp 97.9 F (36.6 C) (Oral)   Resp 16   Ht 1.575 m (5' 2)   Wt 60 kg   SpO2 98%   BMI 24.19 kg/m   Physical Exam Vitals and nursing note reviewed.  Constitutional:      General: She is not in acute distress.    Appearance: Normal appearance. She is well-developed.  HENT:     Head: Normocephalic and atraumatic.     Mouth/Throat:     Mouth: Mucous membranes are moist.  Eyes:     Conjunctiva/sclera: Conjunctivae normal.  Cardiovascular:      Rate and Rhythm: Normal rate and regular rhythm.     Heart sounds: No murmur heard. Pulmonary:     Effort: Pulmonary effort is normal. No respiratory distress.     Breath sounds: Normal breath sounds.  Abdominal:     Palpations: Abdomen is soft.     Tenderness: There is no abdominal tenderness.     Comments: Suprapubic catheter in place.  Musculoskeletal:        General: No swelling.     Cervical back: Neck supple.     Comments: Patient with stage III decubitus a little bit of more central skin breakdown.  The whole area measuring probably about 5 cm but sort  of 2 different areas.  Skin:    General: Skin is warm and dry.     Capillary Refill: Capillary refill takes less than 2 seconds.  Neurological:     Mental Status: She is alert. Mental status is at baseline.  Psychiatric:        Mood and Affect: Mood normal.     (all labs ordered are listed, but only abnormal results are displayed) Labs Reviewed  CBC WITH DIFFERENTIAL/PLATELET - Abnormal; Notable for the following components:      Result Value   WBC 3.8 (*)    RBC 3.85 (*)    Hemoglobin 11.7 (*)    All other components within normal limits  COMPREHENSIVE METABOLIC PANEL WITH GFR - Abnormal; Notable for the following components:   Glucose, Bld 257 (*)    Calcium  8.8 (*)    All other components within normal limits  URINALYSIS, ROUTINE W REFLEX MICROSCOPIC - Abnormal; Notable for the following components:   Color, Urine AMBER (*)    APPearance CLOUDY (*)    Glucose, UA 150 (*)    Hgb urine dipstick MODERATE (*)    Ketones, ur 5 (*)    Protein, ur 100 (*)    Leukocytes,Ua LARGE (*)    Bacteria, UA MANY (*)    All other components within normal limits  URINE CULTURE    EKG: None  Radiology: No results found.   Procedures   Medications Ordered in the ED - No data to display                                  Medical Decision Making Amount and/or Complexity of Data Reviewed Labs:  ordered.  Risk Prescription drug management.   Stage III decubitus.  Will need some wound care but sounds like things are coming at home.  They are worried about urinary tract infection explained the suprapubic catheter is always look infected but will send for culture.  Will check basic labs so we have baseline on her.  I think patient will be dischargeable home.  With some decubiti strategies.  Patient CBC white count 3.8 which is reassuring hemoglobin 11.7 platelets are normal at 212.  Complete metabolic panel pending.  Urinalysis greater than 50 white blood cells many bacteria but as mentioned above she has suprapubic catheter.  Will go ahead and treat her for that while are waiting for culture to come back.  Patient does not have any antibiotic allergies.  So we will treat her with Keflex .  Complete metabolic panel is back.  The electrolytes are normal blood sugar 257 renal function normal LFTs normal very reassuring.  Will discharge on the Keflex  and sacral cubitus wound care.  Final diagnoses:  Pressure injury of sacral region, stage 3 (HCC)  Acute cystitis without hematuria    ED Discharge Orders          Ordered    cephALEXin  (KEFLEX ) 500 MG capsule  4 times daily        03/11/24 1624               Merril Isakson, MD 03/11/24 1534    Fountain Derusha, MD 03/11/24 1625    Delvis Kau, MD 03/11/24 [phone]

## 2024-03-11 NOTE — ED Triage Notes (Signed)
 Pt sent from home by her daughter for assessment of right sacral wound. Pt is bed bound and has history of dementia. Pt complaining of pain to the area. Pt has been taking tylenol  to manage the pain. Pt has not been by wound care for this wound.

## 2024-03-11 NOTE — Discharge Instructions (Addendum)
 The Keflex  as directed.  Urine culture sent.  Treating with antibiotic just in case there is a urinary tract infection.  Wound care as we discussed.  And it is good that a wound care nurse is coming out.  Follow-up with your doctor as needed.

## 2024-03-13 DIAGNOSIS — E1169 Type 2 diabetes mellitus with other specified complication: Secondary | ICD-10-CM | POA: Diagnosis not present

## 2024-03-13 DIAGNOSIS — E1140 Type 2 diabetes mellitus with diabetic neuropathy, unspecified: Secondary | ICD-10-CM | POA: Diagnosis not present

## 2024-03-13 DIAGNOSIS — R1312 Dysphagia, oropharyngeal phase: Secondary | ICD-10-CM | POA: Diagnosis not present

## 2024-03-14 ENCOUNTER — Telehealth: Payer: Medicare Other | Admitting: Hospice

## 2024-03-14 ENCOUNTER — Other Ambulatory Visit: Payer: Self-pay

## 2024-03-14 DIAGNOSIS — E1140 Type 2 diabetes mellitus with diabetic neuropathy, unspecified: Secondary | ICD-10-CM | POA: Diagnosis not present

## 2024-03-14 DIAGNOSIS — R1312 Dysphagia, oropharyngeal phase: Secondary | ICD-10-CM | POA: Diagnosis not present

## 2024-03-14 DIAGNOSIS — E1169 Type 2 diabetes mellitus with other specified complication: Secondary | ICD-10-CM | POA: Diagnosis not present

## 2024-03-14 LAB — URINE CULTURE: Culture: >=100000 — AB

## 2024-03-14 LAB — CARBAPENEM RESISTANCE PANEL
Carba Resistance IMP Gene: NOT DETECTED
Carba Resistance KPC Gene: NOT DETECTED
Carba Resistance NDM Gene: NOT DETECTED
Carba Resistance OXA48 Gene: NOT DETECTED
Carba Resistance VIM Gene: NOT DETECTED

## 2024-03-14 NOTE — Telephone Encounter (Signed)
 Received fax from [facility] for REXULTI  2MG  TAB. I dont see this medication on current med list. If you want patient to start this medication. Please send Rx to pharmacy with dosage, duration and instructions. Nelson Land, CMA

## 2024-03-15 ENCOUNTER — Telehealth (HOSPITAL_BASED_OUTPATIENT_CLINIC_OR_DEPARTMENT_OTHER): Payer: Self-pay | Admitting: *Deleted

## 2024-03-15 ENCOUNTER — Inpatient Hospital Stay (HOSPITAL_COMMUNITY)
Admission: EM | Admit: 2024-03-15 | Discharge: 2024-03-22 | DRG: 919 | Disposition: A | Discharge status: Home Health | Attending: Family Medicine | Admitting: Family Medicine

## 2024-03-15 ENCOUNTER — Emergency Department (HOSPITAL_COMMUNITY)

## 2024-03-15 ENCOUNTER — Other Ambulatory Visit: Payer: Self-pay

## 2024-03-15 DIAGNOSIS — L89159 Pressure ulcer of sacral region, unspecified stage: Secondary | ICD-10-CM | POA: Insufficient documentation

## 2024-03-15 DIAGNOSIS — I10 Essential (primary) hypertension: Secondary | ICD-10-CM | POA: Diagnosis present

## 2024-03-15 DIAGNOSIS — R6510 Systemic inflammatory response syndrome (SIRS) of non-infectious origin without acute organ dysfunction: Secondary | ICD-10-CM | POA: Diagnosis not present

## 2024-03-15 DIAGNOSIS — Z743 Need for continuous supervision: Secondary | ICD-10-CM | POA: Diagnosis not present

## 2024-03-15 DIAGNOSIS — T68XXXA Hypothermia, initial encounter: Secondary | ICD-10-CM | POA: Diagnosis not present

## 2024-03-15 DIAGNOSIS — Z87891 Personal history of nicotine dependence: Secondary | ICD-10-CM

## 2024-03-15 DIAGNOSIS — Z66 Do not resuscitate: Secondary | ICD-10-CM | POA: Diagnosis present

## 2024-03-15 DIAGNOSIS — K219 Gastro-esophageal reflux disease without esophagitis: Secondary | ICD-10-CM | POA: Diagnosis present

## 2024-03-15 DIAGNOSIS — F05 Delirium due to known physiological condition: Secondary | ICD-10-CM | POA: Diagnosis present

## 2024-03-15 DIAGNOSIS — R680 Hypothermia, not associated with low environmental temperature: Secondary | ICD-10-CM | POA: Diagnosis present

## 2024-03-15 DIAGNOSIS — D649 Anemia, unspecified: Secondary | ICD-10-CM | POA: Diagnosis present

## 2024-03-15 DIAGNOSIS — E1165 Type 2 diabetes mellitus with hyperglycemia: Secondary | ICD-10-CM | POA: Diagnosis present

## 2024-03-15 DIAGNOSIS — Z79890 Hormone replacement therapy (postmenopausal): Secondary | ICD-10-CM

## 2024-03-15 DIAGNOSIS — E11649 Type 2 diabetes mellitus with hypoglycemia without coma: Secondary | ICD-10-CM | POA: Diagnosis present

## 2024-03-15 DIAGNOSIS — E785 Hyperlipidemia, unspecified: Secondary | ICD-10-CM | POA: Diagnosis present

## 2024-03-15 DIAGNOSIS — N3281 Overactive bladder: Secondary | ICD-10-CM | POA: Diagnosis present

## 2024-03-15 DIAGNOSIS — Z7982 Long term (current) use of aspirin: Secondary | ICD-10-CM

## 2024-03-15 DIAGNOSIS — D72819 Decreased white blood cell count, unspecified: Secondary | ICD-10-CM | POA: Diagnosis present

## 2024-03-15 DIAGNOSIS — E871 Hypo-osmolality and hyponatremia: Secondary | ICD-10-CM | POA: Diagnosis present

## 2024-03-15 DIAGNOSIS — Z79899 Other long term (current) drug therapy: Secondary | ICD-10-CM

## 2024-03-15 DIAGNOSIS — E039 Hypothyroidism, unspecified: Secondary | ICD-10-CM | POA: Diagnosis present

## 2024-03-15 DIAGNOSIS — Z794 Long term (current) use of insulin: Secondary | ICD-10-CM | POA: Diagnosis not present

## 2024-03-15 DIAGNOSIS — R400 Somnolence: Secondary | ICD-10-CM

## 2024-03-15 DIAGNOSIS — Z90710 Acquired absence of both cervix and uterus: Secondary | ICD-10-CM

## 2024-03-15 DIAGNOSIS — L89153 Pressure ulcer of sacral region, stage 3: Secondary | ICD-10-CM | POA: Diagnosis present

## 2024-03-15 DIAGNOSIS — Z9359 Other cystostomy status: Secondary | ICD-10-CM | POA: Diagnosis not present

## 2024-03-15 DIAGNOSIS — R4182 Altered mental status, unspecified: Secondary | ICD-10-CM | POA: Diagnosis not present

## 2024-03-15 DIAGNOSIS — Z885 Allergy status to narcotic agent status: Secondary | ICD-10-CM

## 2024-03-15 DIAGNOSIS — R29818 Other symptoms and signs involving the nervous system: Secondary | ICD-10-CM | POA: Diagnosis not present

## 2024-03-15 DIAGNOSIS — Z7401 Bed confinement status: Secondary | ICD-10-CM

## 2024-03-15 DIAGNOSIS — H409 Unspecified glaucoma: Secondary | ICD-10-CM | POA: Diagnosis present

## 2024-03-15 DIAGNOSIS — R0989 Other specified symptoms and signs involving the circulatory and respiratory systems: Secondary | ICD-10-CM | POA: Diagnosis not present

## 2024-03-15 DIAGNOSIS — R6520 Severe sepsis without septic shock: Secondary | ICD-10-CM | POA: Diagnosis present

## 2024-03-15 DIAGNOSIS — T8579XA Infection and inflammatory reaction due to other internal prosthetic devices, implants and grafts, initial encounter: Principal | ICD-10-CM | POA: Diagnosis present

## 2024-03-15 DIAGNOSIS — F03A0 Unspecified dementia, mild, without behavioral disturbance, psychotic disturbance, mood disturbance, and anxiety: Secondary | ICD-10-CM | POA: Diagnosis not present

## 2024-03-15 DIAGNOSIS — R404 Transient alteration of awareness: Secondary | ICD-10-CM | POA: Diagnosis not present

## 2024-03-15 DIAGNOSIS — E162 Hypoglycemia, unspecified: Secondary | ICD-10-CM | POA: Diagnosis not present

## 2024-03-15 DIAGNOSIS — N3000 Acute cystitis without hematuria: Secondary | ICD-10-CM | POA: Diagnosis present

## 2024-03-15 DIAGNOSIS — A419 Sepsis, unspecified organism: Secondary | ICD-10-CM | POA: Diagnosis present

## 2024-03-15 DIAGNOSIS — N390 Urinary tract infection, site not specified: Secondary | ICD-10-CM | POA: Diagnosis not present

## 2024-03-15 DIAGNOSIS — R29898 Other symptoms and signs involving the musculoskeletal system: Secondary | ICD-10-CM | POA: Diagnosis not present

## 2024-03-15 DIAGNOSIS — F0390 Unspecified dementia without behavioral disturbance: Secondary | ICD-10-CM | POA: Diagnosis present

## 2024-03-15 DIAGNOSIS — F03C18 Unspecified dementia, severe, with other behavioral disturbance: Secondary | ICD-10-CM | POA: Diagnosis present

## 2024-03-15 DIAGNOSIS — G9341 Metabolic encephalopathy: Secondary | ICD-10-CM | POA: Diagnosis present

## 2024-03-15 DIAGNOSIS — Z7984 Long term (current) use of oral hypoglycemic drugs: Secondary | ICD-10-CM

## 2024-03-15 DIAGNOSIS — R1310 Dysphagia, unspecified: Secondary | ICD-10-CM | POA: Diagnosis present

## 2024-03-15 DIAGNOSIS — Z789 Other specified health status: Secondary | ICD-10-CM

## 2024-03-15 DIAGNOSIS — L89313 Pressure ulcer of right buttock, stage 3: Secondary | ICD-10-CM | POA: Diagnosis present

## 2024-03-15 DIAGNOSIS — M159 Polyosteoarthritis, unspecified: Secondary | ICD-10-CM | POA: Diagnosis present

## 2024-03-15 DIAGNOSIS — Z888 Allergy status to other drugs, medicaments and biological substances status: Secondary | ICD-10-CM

## 2024-03-15 DIAGNOSIS — G319 Degenerative disease of nervous system, unspecified: Secondary | ICD-10-CM | POA: Diagnosis not present

## 2024-03-15 DIAGNOSIS — F03C11 Unspecified dementia, severe, with agitation: Secondary | ICD-10-CM | POA: Diagnosis present

## 2024-03-15 DIAGNOSIS — L089 Local infection of the skin and subcutaneous tissue, unspecified: Secondary | ICD-10-CM | POA: Diagnosis present

## 2024-03-15 DIAGNOSIS — R9082 White matter disease, unspecified: Secondary | ICD-10-CM | POA: Diagnosis not present

## 2024-03-15 LAB — COMPREHENSIVE METABOLIC PANEL WITH GFR
ALT: 12 U/L (ref 0–44)
AST: 20 U/L (ref 15–41)
Albumin: 3.4 g/dL — ABNORMAL LOW (ref 3.5–5.0)
Alkaline Phosphatase: 36 U/L — ABNORMAL LOW (ref 38–126)
Anion gap: 9 (ref 5–15)
BUN: 24 mg/dL — ABNORMAL HIGH (ref 8–23)
CO2: 24 mmol/L (ref 22–32)
Calcium: 8.4 mg/dL — ABNORMAL LOW (ref 8.9–10.3)
Chloride: 98 mmol/L (ref 98–111)
Creatinine, Ser: 0.79 mg/dL (ref 0.44–1.00)
GFR, Estimated: >60 mL/min (ref >60)
Glucose, Bld: 163 mg/dL — ABNORMAL HIGH (ref 70–99)
Potassium: 4.5 mmol/L (ref 3.5–5.1)
Sodium: 131 mmol/L — ABNORMAL LOW (ref 135–145)
Total Bilirubin: 1.2 mg/dL (ref 0.0–1.2)
Total Protein: 6.1 g/dL — ABNORMAL LOW (ref 6.5–8.1)

## 2024-03-15 LAB — I-STAT CG4 LACTIC ACID, ED: Lactic Acid, Venous: 1.4 mmol/L (ref 0.5–1.9)

## 2024-03-15 LAB — URINALYSIS, ROUTINE W REFLEX MICROSCOPIC
Bilirubin Urine: NEGATIVE
Glucose, UA: 150 mg/dL — AB
Ketones, ur: NEGATIVE mg/dL
Nitrite: NEGATIVE
Protein, ur: NEGATIVE mg/dL
Specific Gravity, Urine: 1.01 (ref 1.005–1.030)
pH: 5 (ref 5.0–8.0)

## 2024-03-15 LAB — CBC WITH DIFFERENTIAL/PLATELET
Abs Immature Granulocytes: 0.04 K/uL (ref 0.00–0.07)
Basophils Absolute: 0 K/uL (ref 0.0–0.1)
Basophils Relative: 0 %
Eosinophils Absolute: 0 K/uL (ref 0.0–0.5)
Eosinophils Relative: 1 %
HCT: 37.8 % (ref 36.0–46.0)
Hemoglobin: 12.1 g/dL (ref 12.0–15.0)
Immature Granulocytes: 1 %
Lymphocytes Relative: 9 %
Lymphs Abs: 0.3 K/uL — ABNORMAL LOW (ref 0.7–4.0)
MCH: 30.9 pg (ref 26.0–34.0)
MCHC: 32 g/dL (ref 30.0–36.0)
MCV: 96.7 fL (ref 80.0–100.0)
Monocytes Absolute: 0.3 K/uL (ref 0.1–1.0)
Monocytes Relative: 7 %
Neutro Abs: 3 K/uL (ref 1.7–7.7)
Neutrophils Relative %: 82 %
Platelets: 221 K/uL (ref 150–400)
RBC: 3.91 MIL/uL (ref 3.87–5.11)
RDW: 13.4 % (ref 11.5–15.5)
WBC: 3.7 K/uL — ABNORMAL LOW (ref 4.0–10.5)
nRBC: 0 % (ref 0.0–0.2)

## 2024-03-15 LAB — GLUCOSE, CAPILLARY
Glucose-Capillary: 224 mg/dL — ABNORMAL HIGH (ref 70–99)
Glucose-Capillary: 234 mg/dL — ABNORMAL HIGH (ref 70–99)
Glucose-Capillary: 296 mg/dL — ABNORMAL HIGH (ref 70–99)

## 2024-03-15 LAB — HEMOGLOBIN A1C
Hgb A1c MFr Bld: 8.3 % — ABNORMAL HIGH (ref 4.8–5.6)
Mean Plasma Glucose: 191.51 mg/dL

## 2024-03-15 LAB — CBG MONITORING, ED
Glucose-Capillary: 98 mg/dL (ref 70–99)
Glucose-Capillary: 126 mg/dL — ABNORMAL HIGH (ref 70–99)

## 2024-03-15 MED ORDER — ATORVASTATIN CALCIUM 40 MG PO TABS
40.0000 mg | ORAL_TABLET | Freq: Every day | ORAL | Status: DC
Start: 2024-03-15 — End: 2024-03-22
  Administered 2024-03-15 – 2024-03-22 (×8): 40 mg via ORAL
  Filled 2024-03-15 (×8): qty 1

## 2024-03-15 MED ORDER — LACTATED RINGERS IV SOLN: INTRAVENOUS | Status: AC

## 2024-03-15 MED ORDER — DORZOLAMIDE HCL-TIMOLOL MAL 2-0.5 % OP SOLN
1.0000 [drp] | Freq: Two times a day (BID) | OPHTHALMIC | Status: DC
  Administered 2024-03-15 – 2024-03-22 (×15): 1 [drp] via OPHTHALMIC
  Filled 2024-03-15: qty 10

## 2024-03-15 MED ORDER — VANCOMYCIN HCL 750 MG/150ML IV SOLN
750.0000 mg | INTRAVENOUS | Status: DC
  Administered 2024-03-16 – 2024-03-17 (×2): 750 mg via INTRAVENOUS
  Filled 2024-03-15 (×3): qty 150

## 2024-03-15 MED ORDER — LACTATED RINGERS IV BOLUS (SEPSIS)
500.0000 mL | Freq: Once | INTRAVENOUS | Status: AC
  Administered 2024-03-15: 500 mL via INTRAVENOUS

## 2024-03-15 MED ORDER — ENOXAPARIN SODIUM 30 MG/0.3ML IJ SOSY
30.0000 mg | PREFILLED_SYRINGE | INTRAMUSCULAR | Status: DC
  Administered 2024-03-15: 30 mg via SUBCUTANEOUS
  Filled 2024-03-15: qty 0.3

## 2024-03-15 MED ORDER — SODIUM CHLORIDE 0.9 % IV SOLN: 250.0000 mL | INTRAVENOUS | Status: AC | PRN

## 2024-03-15 MED ORDER — BRIMONIDINE TARTRATE 0.2 % OP SOLN
1.0000 [drp] | Freq: Two times a day (BID) | OPHTHALMIC | Status: DC
  Administered 2024-03-15 – 2024-03-22 (×15): 1 [drp] via OPHTHALMIC
  Filled 2024-03-15: qty 5

## 2024-03-15 MED ORDER — ACETAMINOPHEN 650 MG RE SUPP
650.0000 mg | Freq: Four times a day (QID) | RECTAL | Status: DC | PRN
Start: 2024-03-15 — End: 2024-03-16

## 2024-03-15 MED ORDER — SODIUM CHLORIDE 0.9 % IV SOLN
2.0000 g | Freq: Once | INTRAVENOUS | Status: AC
  Administered 2024-03-15: 2 g via INTRAVENOUS
  Filled 2024-03-15: qty 12.5

## 2024-03-15 MED ORDER — INSULIN ASPART 100 UNIT/ML IJ SOLN
0.0000 [IU] | Freq: Every day | INTRAMUSCULAR | Status: DC
  Administered 2024-03-15: 3 [IU] via SUBCUTANEOUS
  Administered 2024-03-16: 2 [IU] via SUBCUTANEOUS

## 2024-03-15 MED ORDER — ENOXAPARIN SODIUM 40 MG/0.4ML IJ SOSY
40.0000 mg | PREFILLED_SYRINGE | INTRAMUSCULAR | Status: DC
  Administered 2024-03-16 – 2024-03-22 (×7): 40 mg via SUBCUTANEOUS
  Filled 2024-03-15 (×7): qty 0.4

## 2024-03-15 MED ORDER — INSULIN ASPART 100 UNIT/ML IJ SOLN
0.0000 [IU] | Freq: Three times a day (TID) | INTRAMUSCULAR | Status: DC
  Administered 2024-03-16: 5 [IU] via SUBCUTANEOUS
  Administered 2024-03-16: 9 [IU] via SUBCUTANEOUS

## 2024-03-15 MED ORDER — BREXPIPRAZOLE 2 MG PO TABS: 2.0000 mg | ORAL_TABLET | Freq: Every day | ORAL | 0 refills | Status: DC

## 2024-03-15 MED ORDER — METOPROLOL SUCCINATE ER 25 MG PO TB24
25.0000 mg | ORAL_TABLET | Freq: Every day | ORAL | Status: DC
Start: 2024-03-15 — End: 2024-03-22
  Administered 2024-03-15 – 2024-03-22 (×8): 25 mg via ORAL
  Filled 2024-03-15 (×9): qty 1

## 2024-03-15 MED ORDER — SODIUM CHLORIDE 0.9% FLUSH: 3.0000 mL | INTRAVENOUS | Status: DC | PRN

## 2024-03-15 MED ORDER — METRONIDAZOLE 500 MG/100ML IV SOLN
500.0000 mg | Freq: Once | INTRAVENOUS | Status: AC
  Administered 2024-03-15: 500 mg via INTRAVENOUS
  Filled 2024-03-15: qty 100

## 2024-03-15 MED ORDER — SODIUM CHLORIDE 0.9% FLUSH
3.0000 mL | Freq: Two times a day (BID) | INTRAVENOUS | Status: DC
  Administered 2024-03-16 – 2024-03-22 (×13): 3 mL via INTRAVENOUS

## 2024-03-15 MED ORDER — SODIUM CHLORIDE 0.9 % IV SOLN
2.0000 g | Freq: Two times a day (BID) | INTRAVENOUS | Status: DC
  Administered 2024-03-15 – 2024-03-18 (×6): 2 g via INTRAVENOUS
  Filled 2024-03-15 (×6): qty 12.5

## 2024-03-15 MED ORDER — BREXPIPRAZOLE 2 MG PO TABS
2.0000 mg | ORAL_TABLET | Freq: Every day | ORAL | Status: DC
  Administered 2024-03-15 – 2024-03-18 (×4): 2 mg via ORAL
  Filled 2024-03-15 (×5): qty 1

## 2024-03-15 MED ORDER — AMLODIPINE BESYLATE 5 MG PO TABS
5.0000 mg | ORAL_TABLET | Freq: Every day | ORAL | Status: DC
Start: 2024-03-15 — End: 2024-03-22
  Administered 2024-03-15 – 2024-03-22 (×8): 5 mg via ORAL
  Filled 2024-03-15 (×8): qty 1

## 2024-03-15 MED ORDER — VANCOMYCIN HCL IN DEXTROSE 1-5 GM/200ML-% IV SOLN
1000.0000 mg | Freq: Once | INTRAVENOUS | Status: AC
  Administered 2024-03-15: 1000 mg via INTRAVENOUS
  Filled 2024-03-15: qty 200

## 2024-03-15 MED ORDER — ACETAMINOPHEN 325 MG PO TABS: 650.0000 mg | ORAL_TABLET | Freq: Four times a day (QID) | ORAL | Status: DC | PRN

## 2024-03-15 MED ORDER — ASPIRIN 81 MG PO CHEW
81.0000 mg | CHEWABLE_TABLET | Freq: Every day | ORAL | Status: DC
  Administered 2024-03-15 – 2024-03-22 (×8): 81 mg via ORAL
  Filled 2024-03-15 (×8): qty 1

## 2024-03-15 NOTE — Plan of Care (Signed)

## 2024-03-15 NOTE — Telephone Encounter (Signed)
 Post ED Visit - Positive Culture Follow-up  Culture report reviewed by antimicrobial stewardship pharmacist: [facility] []  Rankin Dee, Pharm.D. []  Venetia Gully, Pharm.D., BCPS AQ-ID []  Garrel Crews, Pharm.D., BCPS []  Almarie Lunger, Pharm.D., BCPS []  [place], [address].D., BCPS, AAHIVP []  Rosaline Bihari, Pharm.D., BCPS, AAHIVP []  Vernell Meier, PharmD, BCPS []  Latanya Hint, PharmD, BCPS []  Donald Medley, PharmD, BCPS []  Rocky Bold, PharmD []  Dorothyann Alert, PharmD, BCPS []  Morene Babe, PharmD  [facility] [x]  Wanda Hasting PharmD []  Romona Bliss, PharmD []  Dolphus Roller, PharmD []  Veva Seip, Rph []  Vernell Daunt) Leonce, PharmD []  Eva Allis, PharmD []  Rosaline Millet, PharmD []  Iantha Batch, PharmD []  Arvin Gauss, PharmD []  Wanda Hasting, PharmD []  Ronal Rav, PharmD []  Rocky Slade, PharmD []  Bard Jeans, PharmD   Positive urine culture Treated with cephalexin , Patient currently in [facility] ED  Jama Wyman Kipper 03/15/2024, 11:33 AM

## 2024-03-15 NOTE — ED Notes (Signed)
 Core temp 91.46f. Provider notified. Pt placed on Bear Hugger at [facility]

## 2024-03-15 NOTE — ED Triage Notes (Signed)
 Pt BIB EMS today from home with CC slightly increased AMS from baseline reported by Pt's daughter. Pt has severe dementia at baseline. Family reported they are unable to get her OOB for four days now and therefore she has a sacral decubitus ulcer.

## 2024-03-15 NOTE — ED Notes (Signed)
 Temp rechecked. Pt had BM. Cleaned pt and replaced linens

## 2024-03-15 NOTE — Assessment & Plan Note (Signed)
 Likely secondary to Serratia UTI vs sacral decubitus ulcer.  Repeat UA demonstrated large leukocytes but negative for nitrites. S/p Cefepime , Zosyn , and Vanc in ED. - Admit to FMTS, attending Dr. Rosalynn - Med-Surg, Vital signs q4h - Regular diet, passed bedside swallow - VTE prophylaxis: Lovenox  - Continue Cefepime  due to susceptibility to Serratia marcescens (from prior culture) and Vanc per pharmacy, likely a 7-day course; de-escalate as able - Follow blood and urine culture - AM CBC, BMP, TSH - Fall precautions  - Delirium precautions - Continue bair hugger and monitor vitals q4h

## 2024-03-15 NOTE — Assessment & Plan Note (Addendum)
 Severe dementia: Is AAOx3 at this time, but will ask about recent events frequently.  On Rexulti  2 mg daily at bedtime, continued on admission. HLD: On Lipitor 40 mg daily, continued on admission. HTN: On Amlodipine  5 mg and Metoprolol  25 mg daily, continued on admission.  T2DM: On insulin  glargine 8 units daily and metformin  1000 mg daily.  Held on admission due to hypoglycemia. Glaucoma: Brimonidine  0.2% eye drop into L eye BID and Cosopt  bilaterally daily, continued on admission. Overactive bladder: Chronic use of suprapubic catheter. On Myrbetriq  25 mg daily and tamsulosin  0.4 mg daily, held on admission. Arthritis: uses Tylenol  prn for pain

## 2024-03-15 NOTE — Assessment & Plan Note (Signed)
 Na 131 on patient. - AM BMP to monitor

## 2024-03-15 NOTE — Plan of Care (Signed)
 FMTS Interim Progress Note  Patient assessed at bedside as part of night rounds.  Noted to be periodically yelling out when entering the room.  Initially confusing statements about [address] but reoriented to conversation.  Denies any pain currently.  States she was having some discomfort in her gluteal region which is since improved.  Oriented to self, knew she was in [place] but did not know she was in the hospital.  Unable to state the month but reported the year was 2025 or 26.  O: BP 135/79 (BP Location: Left Arm)   Pulse 91   Temp 98.3 F (36.8 C)   Resp 19   Ht 5' 2 (1.575 m)   Wt 61.4 kg   SpO2 98%   BMI 24.76 kg/m    General: Elderly female, alert, resting in bed CV: RRR.  Soft systolic murmur in RUB RESP: Normal work of breathing on room air.  CTAB Abdomen: Soft, nontender, nondistended.  Suprapubic site clean, dry and without surrounding drainage.  Foley catheter bag with yellow urine present. Neuro: Alert and oriented x 2, some confused statements.  Moves all extremities spontaneously.  A/P: Sepsis 2/2 sacral decubitus ulcer vs suprapubic catheter infection Vital stable and now normothermic.  Continue IV antibiotics to cover both possible etiologies.  Follow-up blood culture and urine culture when available.  Hypoglycemia, resolved History of T2DM, CBGs now in high 200s.  Will add on insulin  coverage and monitor closely. -SSI plus nightly coverage  Dementia Appears to be near baseline mental status per prior documentation.  Likely waxes and wanes.  Stable and low concern for acute process.   Theophilus Pagan, MD 03/15/2024, 11:33 PM PGY-3, [facility] Service pager [phone]

## 2024-03-15 NOTE — Assessment & Plan Note (Signed)
 History of T2DM, on Lantus  8 units and metformin  1000 mg daily.  Found to be hypoglycemic upon EMS arrival, has improved since being in the ED. - POCT CBG Monitoring ACHS - Restart T2DM meds as indicated

## 2024-03-15 NOTE — Progress Notes (Addendum)
 Transition of Care [facility]) - Inpatient Brief Assessment   Patient Details  Name: Hayley Fisher MRN: [id number] Date of Birth: Dec 23, 1930  Transition of Care [facility]) CM/SW Contact:    Rosaline JONELLE Joe, RN Phone Number: 03/15/2024, 2:01 PM   Clinical Narrative: Patient admitted to the hospital from home with UTI with suprapubic catheter, sacral wound.  Patient is pending PT/OT evaluation.  IP Care management team will continue to follow for needs pending therapy evaluation.  03/15/24 1452 - I spoke with the patient at the bedside - no family in the room at this time.  Patient states that she lives with family at the home.  I called and spoke with the patient's daughter, [name] and she states that the patient lives with her and her spouse at the home that provide 24 hour care at the home.  Daughter states that the patient does not walk at baseline but is able to stand and pivot to [place] and bedside commode at baseline.  Daughter states that she will likely take patient home rather than SNF placement since patient does not do well in a STR environment.  Patient receives CAPDA services with personal care services through [facility] Monday thru Saturday  9 am to 3 pm.  DME at the home includes [facility], bedside commode, RW.  Patient has Suprapubic catheter chronically.  IP Care management will continue to follow the patient and patient will likely return home with family when stable per MD Team.   Transition of Care Asessment: Insurance and Status: (P) Insurance coverage has been reviewed Patient has primary care physician: (P) Yes Home environment has been reviewed: (P) from home Prior level of function:: (P) family assistance   Social Drivers of Health Review: (P) SDOH reviewed needs interventions Readmission risk has been reviewed: (P) Yes Transition of care needs: (P) transition of care needs identified, TOC will continue to follow

## 2024-03-15 NOTE — H&P (Addendum)
 [facility] Admission History and Physical Service Pager: [phone]  Patient name: Hayley Fisher Medical record number: [id number] Date of Birth: 01-Jul-1931 Age: 89 y.o. Gender: female  Primary Care Provider: Lennie Raguel MATSU, DO Consultants: Wound Care Code Status: DNR/DNI which was confirmed with family if patient unable to confirm by daughter Hayley Fisher  Preferred Emergency Contact:  Contact Information     Name Relation Home Work [place] Daughter [phone]  [phone]   Hayley Fisher Daughter   [phone]    Chief Complaint: AMS  Differential and Medical Decision Making:  Hayley Fisher is a 89 y.o. female presenting with AMS . Differential for this patient's presentation of this includes: Sepsis: Presented with hypothermia (91.35F), mild leukopenia (3.7), and altered mental status and met sepsis criteria.  Sepsis likely secondary to a urinary source vs sacral decubitus ulcer.  Patient presented on Monday to the ED for wound check since she has been wheelchair and bedridden, stage III sacral decubitus ulcer (5 cm) was noted with skin breakdown for which she receives wound care.  UA was performed since family was concerned about her suprapubic catheter which always looked infected.  UA demonstrated leukocytes, but no nitrites; patient was treated with Keflex  while awaiting culture to return, which grew Serratia resistant to ceftriaxone  and nitrofurantoin .  Per family, patient has been unable to get out of bed since Monday, due to decreased responsiveness and weakness. Hypoglycemia: Patient noted to be hypoglycemic at home per family (44) and improved with PB&J to 57 by EMS.  Hyponatremia: Na 131 on admission. Assessment & Plan Sepsis (HCC) Hypothermia Likely secondary to Serratia UTI vs sacral decubitus ulcer.  Repeat UA demonstrated large leukocytes but negative for nitrites. S/p Cefepime , Zosyn , and Vanc in ED. - Admit to FMTS, attending Dr. Rosalynn -  Med-Surg, Vital signs q4h - Regular diet, passed bedside swallow - VTE prophylaxis: Lovenox  - Continue Cefepime  due to susceptibility to Serratia marcescens (from prior culture) and Vanc per pharmacy, likely a 7-day course; de-escalate as able - Follow blood and urine culture - AM CBC, BMP, TSH - Fall precautions  - Delirium precautions - Continue bair hugger and monitor vitals q4h  Sacral decubitus ulcer Noted to be stage II/III per chart review.  Unable to examine ulcer on admission.  Asked nursing to document an image in the chart as able. - Wound care consult, appreciate recommendations - PT/OT to treat Hypoglycemia History of T2DM, on Lantus  8 units and metformin  1000 mg daily.  Found to be hypoglycemic upon EMS arrival, has improved since being in the ED. - POCT CBG Monitoring ACHS - Restart T2DM meds as indicated Hyponatremia Na 131 on patient. - AM BMP to monitor Chronic health problem Severe dementia: Is AAOx3 at this time, but will ask about recent events frequently.  On Rexulti  2 mg daily at bedtime, continued on admission. HLD: On Lipitor 40 mg daily, continued on admission. HTN: On Amlodipine  5 mg and Metoprolol  25 mg daily, continued on admission.  T2DM: On insulin  glargine 8 units daily and metformin  1000 mg daily.  Held on admission due to hypoglycemia. Glaucoma: Brimonidine  0.2% eye drop into L eye BID and Cosopt  bilaterally daily, continued on admission. Overactive bladder: Chronic use of suprapubic catheter. On Myrbetriq  25 mg daily and tamsulosin  0.4 mg daily, held on admission. Arthritis: uses Tylenol  prn for pain  FEN/GI: Regular diet VTE Prophylaxis: Lovenox   Disposition: Med-Surg  History of Present Illness:  Hayley Fisher is a 89 y.o. female presenting with  AMS.  Patient reports that she has not been feeling well since end of last week.  She is alert and oriented to name, place, and year.  She denies any headache, blurry vision, chest pain, shortness of  breath, abdominal pain, dysuria, or leg pain at this time.  Patient was unable to provide additional detailed history as to the events that occurred this past week.  Daughter provided remainder of history.  She reports that last Thursday they went to the doctor due to concern for this ulcer on her bottom.  She reports that her mother started getting worse since then.  Family brought Hayley Fisher to the ED on Monday since she reported pain and was more delirious than normal.  Daughter felt like something was not right and was requesting admission at the time, but was told that UTIs are common due to her suprapubic catheter and that they will treat her with Keflex  until urine cultures return.  Reportedly since Monday, daughter reports that she has been going in and out , eating small amounts, having difficulty swallowing her pills, and sundowning at night.  Wound care was ordered by Hayley Fisher on 7/16 however has not been to the house yet.  This morning Hayley Fisher was noted to be soaking wet but very cold per her daughter.  She did denies any fevers that were noted or any abnormal movements.  EMS was called since blood sugar was reportedly 44 this morning.  Patient was able to eat a peanut butter and jelly sandwich which helped improve her sugar to 57 when checked by EMS.  In the ED, patient was found to be hypothermic to 91.7 and hypertensive.  CBC demonstrated leukopenia of 3.7, otherwise stable.  CMP revealed hyponatremia to 131 and elevated glucose.  UA demonstrated leukocytes and negative for nitrates but bacteria was noted.  Urine culture was ordered.  Glucose improved while in the ED.  Lactic acid 1.4.  Chest x-ray revealed no acute infection.  Blood culture was collected.  Cefepime , Flagyl , and vancomycin  were given in the ED after collecting cultures along with LR 500 mL.   Review Of Systems: Per HPI  Pertinent Past Medical History: Hyperlipidemia Diabetes Hypothyroidism Dementia Arthritis Remainder  reviewed in history tab.   Pertinent Past Surgical History: Hernia repair Abdominal hysterectomy Bladder suspension  Remainder reviewed in history tab.  Pertinent Social History: Tobacco use: Former smoker, quit in 1982 Alcohol  use: none Other Substance use: none Lives with sister, daughter, and your son-in-law  Pertinent Family History: Mother: Deceased; colon cancer Sister: Diabetes Brother: Diabetes Daughter: Breast cancer  Remainder reviewed in history tab.   Important Outpatient Medications: Tylenol  500 mg q6h PRN Amlodipine  5 mg daily ASA 81 mg daily Lipitor 40 mg daily Rexulti  2 mg daily Keflex  500 mg QID Brimonidine  0.2% eye drop into L eye BID Cosopt  both eyes BID Ferosul 325 mg daily Insulin  glargine - 8 units daily Synthroid  50 mcg - daughter is unsure if she is taking this, last dispensed 01/26/24 EMLA  cream Metformin  1000 mg daily Metoprolol  succinate 25 mg daily Myrbetriq  25 mg daily Miralax  prn Senna prn Tamsulosin  0.4 mg daily DuoDerm  Remainder reviewed in medication history.   Objective: BP (!) 153/99 (BP Location: Left Arm)   Pulse 87   Temp 97.7 F (36.5 C) (Oral)   Resp 16   Ht 5' 2 (1.575 m)   Wt 61.4 kg   SpO2 98%   BMI 24.76 kg/m  Exam: General: Awake and Alert in NAD  HEENT: NCAT. Sclera anicteric. No rhinorrhea. Cardiovascular: RRR. No M/R/G Respiratory: CTAB, normal WOB on RA. No wheezing, crackles, rhonchi, or diminished breath sounds. Abdomen: Soft, non-tender, non-distended. Bowel sounds normoactive. Extremities: Able to move upper and lower extremities.  No BLE edema.   Skin: Warm and dry. Sacral decubitus ulcer not visualized but noted in images in chart. Neuro: A&Ox3.  CN II: Constricted pupils CN III, IV,VI: EOMI CV V: Normal sensation in V1, V2, V3 CVII: Symmetric smile and brow raise CN VIII: Normal hearing CN IX,X: Symmetric palate raise  CN XI: 5/5 shoulder shrug UE and LE strength 3/5 bilaterally Normal  sensation in UE and LE bilaterally   Labs:  CBC BMET  Recent Labs  Lab 03/15/24 0946  WBC 3.7*  HGB 12.1  HCT 37.8  PLT 221   Recent Labs  Lab 03/15/24 0946  NA 131*  K 4.5  CL 98  CO2 24  BUN 24*  CREATININE 0.79  GLUCOSE 163*  CALCIUM  8.4*    Lactic acid: 1.4 CBG: 98 > 126  EKG: My own interpretation: Regular rate (68) and rhythm.  QTc prolonged at 485.  Imaging Studies Performed: CXR Impression from Radiologist: Low lung volumes.  No acute cardiopulmonary findings.  My Interpretation: No signs of acute infection.  Low lung volumes likely due to positioning.  Janna Ferrier, DO 03/15/2024, 1:48 PM PGY-2, [facility]  FPTS Intern pager: [phone], text pages welcome Secure chat group [facility] Teaching Service

## 2024-03-15 NOTE — Progress Notes (Signed)
 Elink following for sepsis protocol.

## 2024-03-15 NOTE — Progress Notes (Incomplete)
     Daily Progress Note Intern Pager: [phone]  Patient name: Hayley Fisher Medical record number: [id number] Date of birth: 25-Sep-1930 Age: 89 y.o. Gender: female  Primary Care Provider: Lennie Raguel MATSU, DO Consultants: None Code Status: DNR/DNI  Pt Overview and Major Events to Date:  7/25: Admitted to FMTS  Assessment and Plan: Hayley Fisher is a 89 year old female with PMHx dementia, HLD, T2DM and hypothyroidism presenting with AMS in the setting of sepsis likely secondary to sacral decubitus ulcer vs suprapubic catheter infection. Assessment & Plan Sepsis (HCC) Hypothermia (Resolved: 03/16/2024) Hypothermia now resolved.  Will continue on IV antibiotics pending blood and urine culture results. - Continue Cefepime  due to susceptibility to Serratia marcescens (from prior culture)  - Vanc per pharmacy due to soft tissue infection - Follow blood and urine culture - Fall precautions  - Delirium precautions Sacral decubitus ulcer Stage III pressure ulcer to right buttock.  Wound care consulted and provided wound care recommendations.  Mostly nonambulatory at baseline. - PT/OT to treat Hyponatremia Na 131 on patient. - AM BMP to monitor Hypoglycemia (Resolved: 03/16/2024) Resolved, CBGs >200s. Chronic health problem Severe dementia: Appears to be at baseline mental status. HLD: Continue home Lipitor at 40 mg daily HTN: Continue home amlodipine  5 mg daily metoprolol  25 mg daily T2DM: On insulin  glargine 8 units daily and metformin  1000 mg daily.  Held on admission due to hypoglycemia. Glaucoma: Continue home brimonidine  0.2% eye drop into L eye BID and Cosopt  bilaterally daily Overactive bladder: Chronic use of suprapubic catheter. On Myrbetriq  25 mg daily and tamsulosin  0.4 mg daily, held on admission. Arthritis: uses Tylenol  prn for pain GERD: Continue home famotidine  20 mg daily   FEN/GI: Regular PPx: Lovenox  Dispo: Pending clinical stabilization  Subjective:   ***  Objective: Temp:  [91.7 F (33.2 C)-98.7 F (37.1 C)] 98.7 F (37.1 C) (07/25 2342) Pulse Rate:  [64-91] 91 (07/25 2033) Resp:  [11-21] 16 (07/25 2342) BP: (101-176)/(57-150) 101/57 (07/25 2342) SpO2:  [95 %-100 %] 100 % (07/25 2342) Weight:  [61.4 kg-64.1 kg] 61.4 kg (07/25 1345) Physical Exam: General: *** Cardiovascular: *** Respiratory: *** Abdomen: *** Extremities: ***  Laboratory: Most recent CBC Lab Results  Component Value Date   WBC 3.7 (L) 03/15/2024   HGB 12.1 03/15/2024   HCT 37.8 03/15/2024   MCV 96.7 03/15/2024   PLT 221 03/15/2024   Most recent BMP    Latest Ref Rng & Units 03/15/2024    9:46 AM  BMP  Glucose 70 - 99 mg/dL 836   BUN 8 - 23 mg/dL 24   Creatinine 9.55 - 1.00 mg/dL 9.20   Sodium 864 - 854 mmol/L 131   Potassium 3.5 - 5.1 mmol/L 4.5   Chloride 98 - 111 mmol/L 98   CO2 22 - 32 mmol/L 24   Calcium  8.9 - 10.3 mg/dL 8.4     Other pertinent labs: Glucose: 296 A1c: 8.3  Imaging/Diagnostic Tests: DG Chest Port 1 View Result Date: 03/15/2024 IMPRESSION: Low lung volumes.  No acute cardiopulmonary findings.   Theophilus Pagan, MD 03/15/2024, 11:57 PM  PGY-3, [facility] FPTS Intern pager: [phone], text pages welcome Secure chat group [facility] Teaching Service

## 2024-03-15 NOTE — Hospital Course (Addendum)
 Hayley Fisher is a 89 y.o.female with a history of dementia, HLD, T2DM, glaucoma, overactive bladder with chronic suprapubic catheter and hypothyroidism who was admitted to the [facility] Medicine Teaching Service at [facility] for sepsis secondary to urinary source. Her hospital course is detailed below:  Sepsis Presented with hypothermia (91.1F) initially requiring bair hugger and mild leukopenia (3.7) with AMS and met sepsis criteria. Suspected secondary to Serratia UTI from prior urine culture in setting of chronic suprapubic catheter; catheter was replaced in the ED on admission 7/25.  Additionally had stage III sacral decubitus ulcer as possible source as below.  Was started on abx with IV Cefepime  (7/25-7/28) and IV Vancomycin  (7/25-7/26), also s/p one dose of Zosyn  in the ED on 7/25. Was transitioned to PO abx with ciprofloxacin  500mg  once 7/28, then to fosfomycin 3g once on 7/29 to complete course. Suspect AMS 2/2 to Cefepime  as improved a few days after stopping this.  Sacral Decubitus Ulcer Stage III ulcer of right buttock. Wound care consulted and provided recommendations.  AMS Dementia Known chronic with waxing and waning mental status.  Suspected to be acute on chronic in the setting of sepsis above.  Required Haldol  x1 overnight on 7/25 due to agitation, family reports that patient is commonly delirious ON at home.  Patient continued to be lethargic after full treatment of UTI.  Further evaluation was obtained with MRI of the head and EEG which was negative, and grossly negative lab workup.  Thought to be attributed to cefepime  vs hospital-related delirium.  Home sedating and centrally acting meds were decreased such as Myrbetriq , Rexulti  was decreased to 1 mg, Seroquel  DC'd.  Patient mentation improved 7/30pm, back to baseline 7/31AM.   Other chronic conditions were medically managed with home medications and formulary alternatives as necessary (Severe dementia, HTN, HLD, T2DM, glaucoma,  overactive bladder, arthritis)  PCP Follow-up Recommendations: Monitor home overnight delirium and titrate psychoactive meds carefully, in particular Rexulti  and quetiapine  were decreased this hospitalization and mirabegron  was stopped. Monitor chronic anemia OP Re-eval sacral wound and ensure proper home wound care

## 2024-03-15 NOTE — Evaluation (Signed)
 Physical Therapy Evaluation Patient Details Name: Hayley Fisher MRN: [id number] DOB: 06/06/1931 Today's Date: 03/15/2024  History of Present Illness  Pt is a 89 y.o. female who presented 03/15/24 with AMS. Pt admitted with sepsis, UTI, sacral decubitus ulcer, and hypoglycemia. PMH includes HTN, T2DM, hypothyroidism, urinary incontinence with chronic indwelling urinary catheter, arthritis, CKD, glaucoma, HLD, syncope   Clinical Impression  Pt presents with condition above and deficits mentioned below, see PT Problem List. Pt reports she lives with her daughter, son-in-law, and sister in a 1-level house with 6 STE. She also reports she needs assistance to transfer to/from her w/c and assistance to propel/maneuver the w/c. She denied being able to ambulate at baseline. The pt may be a poor historian due to noted cognitive deficits this session, but seemed to have a fairly consistent report compared to CM's discussion with pt's daughter, in which the daughter reported the pt is nonambulatory and is able to perform transfers to/from her w/c at baseline. CM confirmed with daughter that the pt has 24/7 care at d/c. Currently, the pt is requiring maxA for all bed mobility and functional transfers. She displays deficits in balance, generalized strength, bil knee flexion ROM, bil feet sensation, power, cognition, and activity tolerance. She is at high risk for falls. If her family can provide the level of care she needs at d/c, then she could d/c home with HHPT follow-up. However, if they cannot, then would recommend short-term inpatient rehab, < 3 hours/day. Will continue to follow acutely.         If plan is discharge home, recommend the following: Two people to help with walking and/or transfers;Two people to help with bathing/dressing/bathroom;Assistance with cooking/housework;Direct supervision/assist for medications management;Direct supervision/assist for financial management;Assist for transportation;Help  with stairs or ramp for entrance;Supervision due to cognitive status   Can travel by private vehicle   No    Equipment Recommendations Wheelchair cushion (measurements PT);BSC/3in1;Wheelchair (measurements PT);Hospital bed;Hoyer lift (air mattress; roho cushion; unsure of current DME at home)  Recommendations for Other Services       Functional Status Assessment Patient has had a recent decline in their functional status and demonstrates the ability to make significant improvements in function in a reasonable and predictable amount of time.     Precautions / Restrictions Precautions Precautions: Fall;Other (comment) Recall of Precautions/Restrictions: Impaired Precaution/Restrictions Comments: suprapubic catheter; sacral ulcer Restrictions Weight Bearing Restrictions Per Provider Order: No      Mobility  Bed Mobility Overal bed mobility: Needs Assistance Bed Mobility: Rolling, Supine to Sit, Sit to Supine Rolling: Max assist, Used rails   Supine to sit: Max assist, HOB elevated Sit to supine: Max assist   General bed mobility comments: Cues provided for pt to bring leg over the other and reach to ipsilateral rail in which she was rolling, maxA needed at hips and trunk to roll bil for pericare and linen change. Cues provided to bring one leg at a time off R EOB then grab onto therapist to pull trunk up to sit, maxA at legs and trunk and then using bed pad to scoot hips to EOB. MaxA needed to direct trunk and legs back to supine.    Transfers Overall transfer level: Needs assistance Equipment used: 1 person hand held assist Transfers: Sit to/from Stand, Bed to chair/wheelchair/BSC Sit to Stand: Max assist Stand pivot transfers: Max assist         General transfer comment: Bil knees blocked and pt cued to hold onto therapist anterior to her  to pull up and stabilize herself when transferring sit to stand from EOB. MaxA needed to stand and pivot to R towards HOB. Pt maintains  a flexed posture and posterior lean upon standing.    Ambulation/Gait               General Gait Details: unable at baseline  Stairs            Wheelchair Mobility     Tilt Bed    Modified Rankin (Stroke Patients Only)       Balance Overall balance assessment: Needs assistance Sitting-balance support: No upper extremity supported, Feet supported Sitting balance-Leahy Scale: Fair Sitting balance - Comments: static sitting EOB with CGA for safety Postural control: Posterior lean Standing balance support: Bilateral upper extremity supported Standing balance-Leahy Scale: Poor Standing balance comment: Posterior lean noted, maxA to stand with bil knees blocked and bil HHA                             Pertinent Vitals/Pain Pain Assessment Pain Assessment: Faces Faces Pain Scale: Hurts little more Pain Location: generalized with mobility and periarea during pericare Pain Descriptors / Indicators: Discomfort, Grimacing, Guarding Pain Intervention(s): Limited activity within patient's tolerance, Monitored during session, Repositioned    Home Living Family/patient expects to be discharged to:: Private residence Living Arrangements: Children;Other relatives (daughter, son-in-law, and sister) Available Help at Discharge: Family;Available 24 hours/day Type of Home: House Home Access: Stairs to enter Entrance Stairs-Rails: Right Entrance Stairs-Number of Steps: 6   Home Layout: One level Home Equipment: Wheelchair - manual Additional Comments: Pt is questionable historian, info above and below was provided by pt. Per CM's discussion with daughter, pt does live with daughter and son-in-law (who can provide 24/7 care) and is nonambulatory at baseline, but able to transfer to/from her w/c.    Prior Function Prior Level of Function : Needs assist;Patient poor historian/Family not available             Mobility Comments: Pt reports needing assistance to  transfer to/from w/c and assistance to propel/maneuver the w/c       Extremity/Trunk Assessment   Upper Extremity Assessment Upper Extremity Assessment: Defer to OT evaluation    Lower Extremity Assessment Lower Extremity Assessment: Generalized weakness;RLE deficits/detail;LLE deficits/detail RLE Deficits / Details: reports numbness in bil feet; quads MMT score of 3+/5; decreased knee flexion ROM, R>L LLE Deficits / Details: reports numbness in bil feet; quads MMT score of 3+/5; decreased knee flexion ROM, R>L       Communication   Communication Communication: No apparent difficulties    Cognition Arousal: Alert Behavior During Therapy: Flat affect   PT - Cognitive impairments: No family/caregiver present to determine baseline                       PT - Cognition Comments: Pt may be a poor historian. Pt slow to process cues and initiate movements. Poor sequencing and awareness. Poor attention noted as well. Unsure if baseline or not. Following commands: Impaired Following commands impaired: Follows one step commands inconsistently, Follows one step commands with increased time     Cueing Cueing Techniques: Verbal cues, Tactile cues, Gestural cues     General Comments      Exercises     Assessment/Plan    PT Assessment Patient needs continued PT services  PT Problem List Decreased strength;Decreased activity tolerance;Decreased balance;Decreased mobility;Decreased range of motion;Decreased cognition;Decreased knowledge  of use of DME;Impaired sensation;Decreased skin integrity       PT Treatment Interventions DME instruction;Functional mobility training;Therapeutic activities;Therapeutic exercise;Balance training;Neuromuscular re-education;Cognitive remediation;Patient/family education;Wheelchair mobility training    PT Goals (Current goals can be found in the Care Plan section)  Acute Rehab PT Goals Patient Stated Goal: did not state PT Goal Formulation:  With patient Time For Goal Achievement: 03/29/24 Potential to Achieve Goals: Fair    Frequency Min 2X/week     Co-evaluation               AM-PAC PT 6 Clicks Mobility  Outcome Measure Help needed turning from your back to your side while in a flat bed without using bedrails?: A Lot Help needed moving from lying on your back to sitting on the side of a flat bed without using bedrails?: A Lot Help needed moving to and from a bed to a chair (including a wheelchair)?: A Lot Help needed standing up from a chair using your arms (e.g., wheelchair or bedside chair)?: A Lot Help needed to walk in hospital room?: Total Help needed climbing 3-5 steps with a railing? : Total 6 Click Score: 10    End of Session   Activity Tolerance: Patient limited by fatigue Patient left: in bed;with call bell/phone within reach;with bed alarm set;Other (comment) (rolled to R side (was just on L prior to arrival)) Nurse Communication: Mobility status PT Visit Diagnosis: Unsteadiness on feet (R26.81);Muscle weakness (generalized) (M62.81)    Time: 1651-1710 PT Time Calculation (min) (ACUTE ONLY): 19 min   Charges:   PT Evaluation $PT Eval Moderate Complexity: 1 Mod   PT General Charges $$ ACUTE PT VISIT: 1 Visit         Theo Ferretti, PT, DPT Acute Rehabilitation Services  Office: [phone]   Theo CHRISTELLA Ferretti 03/15/2024, 6:20 PM

## 2024-03-15 NOTE — Assessment & Plan Note (Signed)
 Noted to be stage II/III per chart review.  Unable to examine ulcer on admission.  Asked nursing to document an image in the chart as able. - Wound care consult, appreciate recommendations - PT/OT to treat

## 2024-03-15 NOTE — Progress Notes (Addendum)
 Daily Progress Note Intern Pager: [phone]  Patient name: Hayley Fisher Medical record number: [id number] Date of birth: November 07, 1930 Age: 89 y.o. Gender: female  Primary Care Provider: Lennie Raguel MATSU, DO Consultants: None Code Status: DNR/DNI  Pt Overview and Major Events to Date:  7/25: Admitted to FMTS  Assessment and Plan: Hayley Fisher is a 89 year old female with PMHx dementia, HLD, T2DM, glaucoma, overactive bladder with chronic suprapubic catheter and hypothyroidism presenting with AMS in the setting of sepsis likely secondary to sacral decubitus ulcer vs suprapubic catheter infection. Assessment & Plan Sepsis (HCC) Hypothermia (Resolved: 03/16/2024) Hypothermia now resolved with stable vitals.  Will continue on IV antibiotics pending blood and urine culture results.  Chronic suprapubic catheter with normal UOP. - Continue Cefepime  due to susceptibility to Serratia marcescens (from prior culture)  - Vanc per pharmacy due to soft tissue infection - Follow blood and urine culture - Fall precautions  Sacral decubitus ulcer Stage III pressure ulcer to right buttock.  Wound care consulted and provided wound care recommendations.  Mostly nonambulatory at baseline. - PT/OT to treat Hyponatremia Na improved, likely in the setting if sepsis. Dementia (HCC) Mental status appears to wax and wane, mostly near baseline.  Episode of agitation and confusion overnight requiring Haldol  x 1. -Prioritize redirection and consider TeleSitter if persistently agitated -RD consult Hypoglycemia (Resolved: 03/16/2024) Resolved, CBGs >200s. Chronic health problem HLD: Continue home Lipitor at 40 mg daily HTN: Continue home amlodipine  5 mg daily metoprolol  25 mg daily T2DM: A1c 8.3. Added on SSI plus nightly coverage.  On Lantus  8 units daily and metformin  at home, will add back if indicated. Glaucoma: Continue home brimonidine  0.2% eye drop into L eye BID and Cosopt  bilaterally  daily Overactive bladder: Chronic use of suprapubic catheter.  Continue home Myrbetriq  25 mg daily and tamsulosin  0.4 mg daily GERD: Continue home famotidine  20 mg daily   FEN/GI: Regular PPx: Lovenox  Dispo: Pending clinical stabilization  Subjective:  Episode of agitation and confusion overnight requiring medication management.  Otherwise has been redirectable with nursing staff.  Reports no concerns, denies pain.  Last BM day prior to admission.  Has been drinking some fluids.  Objective: Temp:  [91.7 F (33.2 C)-98.7 F (37.1 C)] 98.7 F (37.1 C) (07/25 2342) Pulse Rate:  [64-91] 91 (07/25 2033) Resp:  [11-21] 16 (07/25 2342) BP: (101-176)/(57-150) 101/57 (07/25 2342) SpO2:  [95 %-100 %] 100 % (07/25 2342) Weight:  [61.4 kg-64.1 kg] 61.4 kg (07/25 1345) Physical Exam: General: Elderly appearing female, laying in bed with periodic mumbling Cardiovascular: RRR without murmur Respiratory: Normal work of breathing on room air.  CTAB Abdomen: Soft, nontender, nondistended. Extremities: No peripheral edema Neuro: Alert, oriented x 2 to person and time.  Mostly linear thinking and participates in conversation.  Mild confusion  Laboratory: Most recent CBC Lab Results  Component Value Date   WBC 3.7 (L) 03/15/2024   HGB 12.1 03/15/2024   HCT 37.8 03/15/2024   MCV 96.7 03/15/2024   PLT 221 03/15/2024   Most recent BMP    Latest Ref Rng & Units 03/15/2024    9:46 AM  BMP  Glucose 70 - 99 mg/dL 836   BUN 8 - 23 mg/dL 24   Creatinine 9.55 - 1.00 mg/dL 9.20   Sodium 864 - 854 mmol/L 131   Potassium 3.5 - 5.1 mmol/L 4.5   Chloride 98 - 111 mmol/L 98   CO2 22 - 32 mmol/L 24   Calcium  8.9 - 10.3 mg/dL  8.4     Other pertinent labs: Glucose: 296 A1c: 8.3  Imaging/Diagnostic Tests: DG Chest Port 1 View Result Date: 03/15/2024 IMPRESSION: Low lung volumes.  No acute cardiopulmonary findings.   Hayley Pagan, MD 03/15/2024, 11:57 PM  PGY-3, [facility]  [facility] FPTS Intern pager: [phone], text pages welcome Secure chat group [facility] Teaching Service

## 2024-03-15 NOTE — ED Provider Notes (Signed)
 [place] EMERGENCY DEPARTMENT AT [facility] Provider Note   CSN: [id number] Arrival date & time: 03/15/24  9144     Patient presents with: Altered Mental Status   Hayley Fisher is a 89 y.o. female.   HPI 89 year old female presents today as code sepsis, altered mental status, hypoglycemia. Patient was initially seen and evaluated with EMS at bedside.  They report that they were called to the home as the patient was weaker than usual.  They report that initial blood sugar was low at 250.  They gave her IV glucose and blood sugar increased.  She has seemed somewhat more alert per them. Daughter is now at bedside. Daughter reports that patient has some decreased activity at baseline.  She has been noted to have a new sacral decubitus ulcer.  They were told to have her stay out of her wheelchair and her bed rotating sides.  She has been in the bed mostly since Thursday.  Over the past several days she has been less responsive than usual.  She was seen at the emergency department at [facility] on Monday.  They were told at that time that she had a urinary tract infection.  She has had decreased p.o. intake.  She has been complaining of difficulty swallowing.  She has not had nausea or vomiting or diarrhea.  Her daughter noted that she seemed to be sweaty yesterday but was cool to touch.  No definite fevers noted.  Suprapubic catheter is in place.  She has had some decreased urine output with darker urine than usual. [facility] Patient is followed at [facility] family practice    Prior to Admission medications   Medication Sig Start Date End Date Taking? Authorizing Provider  acetaminophen  (TYLENOL ) 500 MG tablet Take 1,000 mg by mouth every 6 (six) hours as needed for moderate pain.   Yes [provider]  acetaminophen -codeine  (TYLENOL  #3) 300-30 MG tablet Take 1 tablet by mouth every 6 (six) hours as needed for moderate pain (pain score 4-6)  or severe pain (pain score 7-10). 03/11/24  Yes Ula Prentice SAUNDERS, MD  amLODipine  (NORVASC ) 5 MG tablet Take 1 tablet (5 mg total) by mouth daily. 02/28/24  Yes Baker, Raguel MATSU, DO  aspirin  81 MG chewable tablet Chew 81 mg by mouth daily.   Yes [provider]  atorvastatin  (LIPITOR) 40 MG tablet TAKE 1 TABLET BY MOUTH EVERY DAY 01/08/24  Yes Christia Budds, MD  brexpiprazole  (REXULTI ) 1 MG TABS tablet Take 0.5 mg by mouth daily.   Yes [provider]  brimonidine  (ALPHAGAN ) 0.2 % ophthalmic solution Place 1 drop into the left eye 2 (two) times daily. 03/30/21  Yes [provider]  cephALEXin  (KEFLEX ) 500 MG capsule Take 1 capsule (500 mg total) by mouth 4 (four) times daily. 03/11/24  Yes Zackowski, Scott, MD  diclofenac  Sodium (VOLTAREN ) 1 % GEL Apply 2 g topically 4 (four) times daily. Apply over the affected area.  Please avoid the genital areas. Patient taking differently: Apply 2 g topically in the morning and at bedtime. Please avoid the genital areas. 10/03/23  Yes Rosendo Rush, MD  dorzolamide -timolol  (COSOPT ) 22.3-6.8 MG/ML ophthalmic solution Place 1 drop into both eyes 2 (two) times daily. 05/18/20  Yes [provider]  famotidine  (PEPCID ) 20 MG tablet TAKE 1 TABLET BY MOUTH EVERY DAY 12/05/23  Yes Christia Budds, MD  FEROSUL 325 (65 Fe) MG tablet TAKE 1 TABLET BY MOUTH DAILY WITH BREAKFAST 10/11/23  Yes Jagadish, Mayuri, MD  insulin  glargine (LANTUS  SOLOSTAR) 100 UNIT/ML Solostar Pen Inject 6 Units into the skin daily. 02/08/24  Yes Jagadish, Mayuri, MD  levothyroxine  (SYNTHROID ) 50 MCG tablet Take 1 tablet (50 mcg total) by mouth daily. 11/01/23  Yes Shamleffer, Ibtehal Jaralla, MD  lidocaine -prilocaine  (EMLA ) cream Apply 1 Application topically as needed (Apply to ulcer as needed for pain.). 03/06/24 04/05/24 Yes Lafe Domino, DO  metFORMIN  (GLUCOPHAGE -XR) 500 MG 24 hr tablet Take 2 tablets (1,000 mg total) by mouth daily with breakfast. 10/31/23  Yes Shamleffer,  Ibtehal Jaralla, MD  metoprolol  succinate (TOPROL -XL) 25 MG 24 hr tablet Take 1 tablet (25 mg total) by mouth daily. 03/07/24  Yes Baker, Raguel MATSU, DO  mirabegron  ER (MYRBETRIQ ) 25 MG TB24 tablet Take 1 tablet (25 mg total) by mouth daily. 03/07/24  Yes Baker, Amelia G, DO  polyethylene glycol powder (GLYCOLAX /MIRALAX ) 17 GM/SCOOP powder Take 17 g by mouth in the morning and at bedtime. Patient taking differently: Take 17 g by mouth daily. 08/20/21  Yes Jagadish, Mayuri, MD  psyllium (METAMUCIL) 58.6 % packet Take 1 packet by mouth daily. Patient taking differently: Take 1 packet by mouth daily as needed (constipation). 10/31/23  Yes Christia Budds, MD  REXULTI  2 MG TABS tablet Take 2 mg by mouth at bedtime. 02/12/24  Yes [provider]  SARNA lotion Apply 1 application  topically 4 (four) times daily as needed for itching (affected areas). 05/27/21  Yes [provider]  senna (SENOKOT) 8.6 MG TABS tablet Take 1 tablet (8.6 mg total) by mouth 2 (two) times daily. Patient taking differently: Take 1 tablet by mouth daily as needed for mild constipation or moderate constipation. 08/09/21  Yes Marlee Lynwood NOVAK, MD  tamsulosin  (FLOMAX ) 0.4 MG CAPS capsule Take 1 capsule (0.4 mg total) by mouth daily. 02/28/24  Yes Baker, Amelia G, DO  VYZULTA  0.024 % SOLN Place 1 drop into both eyes at bedtime.   Yes [provider]  ACCU-CHEK AVIVA PLUS test strip USE TO TEST BLOOD SUGAR UP TO 3 TIMES A DAY. 03/06/24   Shamleffer, Ibtehal Jaralla, MD  Control Gel Formula Dressing (DUODERM CGF EXTRA THIN) MISC Apply 1 each topically every three (3) days as needed (Change every 3-5 days, or sooner if soiled.). 03/06/24   Lafe Domino, DO  Insulin  Pen Needle 32G X 4 MM MISC 1 Device by Does not apply route daily in the afternoon. 10/31/23   Shamleffer, Ibtehal Jaralla, MD    Allergies: Levemir  [insulin  detemir] and Tramadol     Review of Systems  Updated Vital Signs BP (!) 153/77   Pulse 76    Temp (!) 91.7 F (33.2 C) (Core (Comment))   Resp 17   Ht 1.575 m (5' 2)   Wt 64.1 kg   SpO2 100%   BMI 25.85 kg/m   Physical Exam Vitals reviewed.  Constitutional:      General: She is not in acute distress. HENT:     Head: Normocephalic and atraumatic.     Right Ear: External ear normal.     Left Ear: External ear normal.     Nose: Nose normal.     Mouth/Throat:     Mouth: Mucous membranes are dry.  Eyes:     Pupils: Pupils are equal, round, and reactive to light.  Cardiovascular:     Rate and Rhythm: Normal rate.     Pulses: Normal pulses.  Pulmonary:     Effort: Pulmonary effort is normal.  Abdominal:  General: Abdomen is flat.     Palpations: Abdomen is soft.     Comments: Suprapubic catheter in place  Musculoskeletal:        General: Normal range of motion.     Cervical back: Normal range of motion.  Skin:    General: Skin is warm and dry.     Capillary Refill: Capillary refill takes less than 2 seconds.     Comments: Sacral decubitus ulcer that is not through skin.  There is no surrounding erythema or fluctuance noted  Neurological:     General: No focal deficit present.     Mental Status: She is alert.     Comments: Patient awake and alert.  She is able to tell me her name.  There are no lateralized deficits noted she is able to move all 4 extremities against gravity  Psychiatric:        Mood and Affect: Mood normal.     (all labs ordered are listed, but only abnormal results are displayed) Labs Reviewed  CBC WITH DIFFERENTIAL/PLATELET - Abnormal; Notable for the following components:      Result Value   WBC 3.7 (*)    Lymphs Abs 0.3 (*)    All other components within normal limits  COMPREHENSIVE METABOLIC PANEL WITH GFR - Abnormal; Notable for the following components:   Sodium 131 (*)    Glucose, Bld 163 (*)    BUN 24 (*)    Calcium  8.4 (*)    Total Protein 6.1 (*)    Albumin 3.4 (*)    Alkaline Phosphatase 36 (*)    All other components  within normal limits  CBG MONITORING, ED - Abnormal; Notable for the following components:   Glucose-Capillary 126 (*)    All other components within normal limits  CULTURE, BLOOD (SINGLE)  URINALYSIS, ROUTINE W REFLEX MICROSCOPIC  I-STAT CG4 LACTIC ACID, ED  CBG MONITORING, ED  CBG MONITORING, ED  I-STAT CG4 LACTIC ACID, ED    EKG: None  Radiology: [facility] Chest Port 1 View Result Date: 03/15/2024 CLINICAL DATA:  Sepsis. EXAM: PORTABLE CHEST 1 VIEW COMPARISON:  01/25/2024. FINDINGS: Low lung volumes with associated accentuation of the cardiopericardial silhouette. Mediastinal contours are within normal limits. No focal consolidation, pleural effusion, or pneumothorax. No acute osseous abnormality. IMPRESSION: Low lung volumes.  No acute cardiopulmonary findings. Electronically Signed   By: Harrietta Sherry M.D.   On: 03/15/2024 09:41     BLADDER CATHETERIZATION  Date/Time: 03/15/2024 11:25 AM  Performed by: Levander Houston, MD Authorized by: Levander Houston, MD   Consent:    Consent obtained:  Emergent situation   Risks discussed:  False passage and incomplete procedure   Alternatives discussed:  No treatment Universal protocol:    Immediately prior to procedure, a time out was called: yes     Patient identity confirmed:  Arm band Pre-procedure details:    Procedure purpose:  Therapeutic   Preparation: Patient was prepped and draped in usual sterile fashion   Procedure details:    Catheter insertion:  Indwelling   Catheter type:  Foley   Catheter size:  16 Fr   Bladder irrigation: no     Number of attempts:  1 Comments:     Prior suprapubic catheter removed for urine culture to be sent and new catheter placed Urine out with easy placement and good balloon inflation .Critical Care  Performed by: Levander Houston, MD Authorized by: Levander Houston, MD   Critical care provider statement:  Critical care time (minutes):  50   Critical care end time:  03/15/2024 11:54 AM   Critical  care time was exclusive of:  Separately billable procedures and treating other patients and teaching time   Critical care was necessary to treat or prevent imminent or life-threatening deterioration of the following conditions:  CNS failure or compromise   Critical care was time spent personally by me on the following activities:  Development of treatment plan with patient or surrogate, discussions with consultants, evaluation of patient's response to treatment, examination of patient, ordering and review of laboratory studies, ordering and review of radiographic studies, ordering and performing treatments and interventions, pulse oximetry, re-evaluation of patient's condition and review of old charts    Medications Ordered in the ED  sodium chloride  flush (NS) 0.9 % injection 3 mL (3 mLs Intravenous Not Given 03/15/24 0953)  sodium chloride  flush (NS) 0.9 % injection 3 mL (has no administration in time range)  0.9 %  sodium chloride  infusion (has no administration in time range)  lactated ringers  infusion (has no administration in time range)  ceFEPIme  (MAXIPIME ) 2 g in sodium chloride  0.9 % 100 mL IVPB (2 g Intravenous New Bag/Given 03/15/24 1049)  metroNIDAZOLE  (FLAGYL ) IVPB 500 mg (500 mg Intravenous New Bag/Given 03/15/24 1048)  vancomycin  (VANCOCIN ) IVPB 1000 mg/200 mL premix (1,000 mg Intravenous New Bag/Given 03/15/24 1049)  lactated ringers  bolus 500 mL (500 mLs Intravenous New Bag/Given 03/15/24 1046)    Clinical Course as of 03/15/24 1154  Fri Mar 15, 2024  1059 Sodium 131-decreased from first prior [DR]  1059 Cbc reviewed interpreted and wbc low  [DR]    Clinical Course User Index [DR] Levander Houston, MD                                 Medical Decision Making Amount and/or Complexity of Data Reviewed Labs: ordered. Radiology: ordered.  Risk Prescription drug management.   Urine culture from Monday reviewed.  Culture grew out's radius.  It is not susceptible to  Rocephin  Patient is significantly hypothermic.  She has known urinary tract infection.  Patient is treated per sepsis protocol with broad-spectrum antibiotics. Blood pressure stable Temperature decreased at 91- bair hugger initiated 89 year old female recently diagnosed urinary tract infection, history of dementia, presents today with decreased responsiveness.  Patient noted to be hypoglycemic which was replenished prehospital with p.o. and with IV. Here on evaluation patient is awake.  She does not have any specific complaints except for pain around the skin breakdown in sacral area. Patient is evaluated with labs that are significant for some leukopenia, hyponatremia and otherwise appear to be near normal. Patient had urine sent on Monday that grew serratia Patient has had her suprapubic catheter replaced and urine is sent again. Serratia is resistant to Rocephin . Patient initially received broad-spectrum antibiotics Patient's first lactic is normal, blood pressure has been stable, doubt sepsis Patient was hypoglycemic and has been having her blood sugar monitored in the ED with first blood sugar 98 and repeat 126  1 altered mental status-some component may be secondary to 2 3 and 4.  However patient did not completely clear with increased blood sugar.  She is hypothermic but does not have any other definitive signs of overwhelming infection with normal lactic care. 2 hypoglycemia-patient received p.o. and IV glucose.  She has been maintaining her blood sugar here in the ED. 3 urinary tract infection-patient with greater than  100,000 colonies serratia from urine culture of 7/21 4 hyponatremia  Care discussed with Dr. Janna on-call for family practice who will see and evaluate for admission     Final diagnoses:  Altered mental status, unspecified altered mental status type  Hypoglycemia  Hypothermia, initial encounter  Urinary tract infection without hematuria, site unspecified    ED  Discharge Orders     None          Levander Houston, MD 03/15/24 1154

## 2024-03-15 NOTE — Evaluation (Signed)
 Clinical/Bedside Swallow Evaluation Patient Details  Name: Hayley Fisher MRN: [id number] Date of Birth: 1931-04-21  Today's Date: 03/15/2024 Time: SLP Start Time (ACUTE ONLY): 1445 SLP Stop Time (ACUTE ONLY): 1458 SLP Time Calculation (min) (ACUTE ONLY): 13 min  Past Medical History:  Past Medical History:  Diagnosis Date   Arthritis    Cataract    bil cateracts removed   Chronic kidney disease    spot on one of my kidneys per pt   Diabetes mellitus    Diarrhea 02/20/2022   Gastroesophageal reflux disease without esophagitis 09/16/2020   Glaucoma    Goals of care, counseling/discussion 09/09/2021   Hyperkalemia 07/02/2020   Hyperlipidemia    Hyperplastic colon polyp    Hypertension    Hypothyroidism 12/09/2020   Leukocytopenia 06/27/2021   Metabolic acidosis 07/02/2020   S/P IVC filter 01/16/2021   DVT 12/2020   Syncope    Thyroid  disease    Urge incontinence 05/05/2022   Past Surgical History:  Past Surgical History:  Procedure Laterality Date   ABDOMINAL HYSTERECTOMY     BLADDER SUSPENSION     bladder tacking     COLONOSCOPY     EYE SURGERY     INGUINAL HERNIA REPAIR Right    IR CATHETER TUBE CHANGE  09/15/2022   IR IVC FILTER PLMT / S&I /IMG GUID/MOD SED  01/16/2021   IR US  GUIDE BX ASP/DRAIN  08/02/2022   HPI:  Pt is a 89 year old female admitted iwth AMS due to sepsis Likely secondary to Serratia UTI vs sacral decubitus ulcer. Pt is bed/wheelchair bound. Prior SLP assessment in 2022 recommended regualr food and thin liquids.    Assessment / Plan / Recommendation  Clinical Impression  Pt demonstrates no swallowing impairment. She does report taking her pills crushed in puree as her preference and also needs total assist with meals. No diet modification needed as dentition is excellent and mastication is WNL. WIll sign off.      Aspiration Risk       Diet Recommendation Regular;Thin liquid    Liquid Administration via: Cup;Straw Medication  Administration: Crushed with puree Supervision: Staff to assist with self feeding Postural Changes: Remain upright for at least 30 minutes after po intake;Seated upright at 90 degrees    Other  Recommendations       Assistance Recommended at Discharge    Functional Status Assessment    Frequency and Duration            Prognosis        Swallow Study   General HPI: Pt is a 89 year old female admitted iwth AMS due to sepsis Likely secondary to Serratia UTI vs sacral decubitus ulcer. Pt is bed/wheelchair bound. Prior SLP assessment in 2022 recommended regualr food and thin liquids. Type of Study: Bedside Swallow Evaluation Previous Swallow Assessment: none Diet Prior to this Study: Regular;Thin liquids (Level 0) Temperature Spikes Noted: No Respiratory Status: Room air History of Recent Intubation: No Behavior/Cognition: Alert;Cooperative;Pleasant mood Oral Cavity Assessment: Within Functional Limits Oral Care Completed by SLP: No Oral Cavity - Dentition: Adequate natural dentition Vision: Functional for self-feeding Self-Feeding Abilities: Total assist Patient Positioning: Upright in bed Baseline Vocal Quality: Normal Volitional Cough: Strong Volitional Swallow: Able to elicit    Oral/Motor/Sensory Function Overall Oral Motor/Sensory Function: Within functional limits   Ice Chips     Thin Liquid Thin Liquid: Within functional limits Presentation: Straw    Nectar Thick Nectar Thick Liquid: Not tested   Honey Thick Honey  Thick Liquid: Not tested   Puree Puree: Within functional limits Presentation: Spoon   Solid     Solid: Within functional limits      Owyn Raulston, Consuelo Fitch 03/15/2024,3:03 PM

## 2024-03-15 NOTE — Consult Note (Addendum)
 WOC Nurse Consult Note: Reason for Consult: buttocks wound Wound type: Stage 3 pressure injury to R buttocks (patient reports she is non-ambulatory and sits in chair majority of time) Pressure Injury POA: Yes Measurement: 5 cm x 3 cm x 0.1 cm Wound bed: red, moist with slight slough noted  Drainage (amount, consistency, odor) none Periwound: macerated Dressing procedure/placement/frequency:  Cleanse wound with NS, apply xeroform over wound bed and cover with silicone foam dressing.  Change daily.  Patient will benefit from Prevalon ([phone]# [phone]) boots to offload pressure to bilateral heels, LALM for moisture management and pressure redistribution and a chair pad.  WOC team will not follow at this time.  Please re-consult if new needs arise.  Thank you,  Doyal Polite, RN, MSN, [facility] WOC Team

## 2024-03-15 NOTE — Progress Notes (Signed)
 Pharmacy Antibiotic Note  Hayley Fisher is a 89 y.o. female admitted on 03/15/2024 with sepsis.  Pharmacy has been consulted for vancomycin  and cefepime  dosing.  Plan: Vancomycin  750 mg IV every 24 hours.  Goal trough 10-15 mcg/mL. Cefepime  2 grams IV every 12 hours  Vancomycin  dosing based on Scr of 0.8 and is predicted to result in an AUC of 483.6 and a trough of 13.3.  Monitor levels as appropriate and for signs of renal insufficiency daily.  Height: 5' 2 (157.5 cm) Weight: 64.1 kg (141 lb 5 oz) IBW/kg (Calculated) : 50.1  Temp (24hrs), Avg:92.5 F (33.6 C), Min:91.7 F (33.2 C), Max:93.3 F (34.1 C)  Recent Labs  Lab 03/11/24 1555 03/15/24 0946 03/15/24 0955  WBC 3.8* 3.7*  --   CREATININE 0.73 0.79  --   LATICACIDVEN  --   --  1.4    Estimated Creatinine Clearance: 41.1 mL/min (by C-G formula based on SCr of 0.79 mg/dL).    Allergies  Allergen Reactions   Levemir  [Insulin  Detemir] Itching   Tramadol  Itching and Other (See Comments)    Takes occasionally with Benadryl     Antimicrobials this admission: 7/25 cefepime  >>  7/25 vancomycin  >>  7/25 metronidazole  7/21 cephalexin  x 1  Microbiology results: 7/21 UCx: Serratia marcescens   Thank you for allowing pharmacy to be a part of this patient's care.  Maegan Graceanna Theissen, PharmD PGY-1 Pharmacy Resident [facility] 03/15/2024 2:10 PM

## 2024-03-15 NOTE — Assessment & Plan Note (Addendum)
 Likely secondary to Serratia UTI vs sacral decubitus ulcer.  Repeat UA demonstrated large leukocytes but negative for nitrites. S/p Cefepime , Zosyn , and Vanc in ED. - Admit to FMTS, attending Dr. Rosalynn - Med-Surg, Vital signs q4h - Regular diet, passed bedside swallow - VTE prophylaxis: Lovenox  - Continue Cefepime  due to susceptibility to Serratia marcescens (from prior culture) and Vanc per pharmacy, likely a 7-day course; de-escalate as able - Follow blood and urine culture - AM CBC, BMP, TSH - Fall precautions  - Delirium precautions - Continue bair hugger and monitor vitals q4h

## 2024-03-16 DIAGNOSIS — L89153 Pressure ulcer of sacral region, stage 3: Secondary | ICD-10-CM | POA: Diagnosis not present

## 2024-03-16 DIAGNOSIS — A419 Sepsis, unspecified organism: Secondary | ICD-10-CM | POA: Diagnosis not present

## 2024-03-16 LAB — URINE CULTURE: Culture: NO GROWTH

## 2024-03-16 LAB — CBC
HCT: 33.1 % — ABNORMAL LOW (ref 36.0–46.0)
Hemoglobin: 11.1 g/dL — ABNORMAL LOW (ref 12.0–15.0)
MCH: 31.2 pg (ref 26.0–34.0)
MCHC: 33.5 g/dL (ref 30.0–36.0)
MCV: 93 fL (ref 80.0–100.0)
Platelets: 212 K/uL (ref 150–400)
RBC: 3.56 MIL/uL — ABNORMAL LOW (ref 3.87–5.11)
RDW: 13.2 % (ref 11.5–15.5)
WBC: 4.6 K/uL (ref 4.0–10.5)
nRBC: 0 % (ref 0.0–0.2)

## 2024-03-16 LAB — BASIC METABOLIC PANEL WITH GFR
Anion gap: 8 (ref 5–15)
BUN: 21 mg/dL (ref 8–23)
CO2: 23 mmol/L (ref 22–32)
Calcium: 8.4 mg/dL — ABNORMAL LOW (ref 8.9–10.3)
Chloride: 101 mmol/L (ref 98–111)
Creatinine, Ser: 0.86 mg/dL (ref 0.44–1.00)
GFR, Estimated: >60 mL/min (ref >60)
Glucose, Bld: 286 mg/dL — ABNORMAL HIGH (ref 70–99)
Potassium: 4.6 mmol/L (ref 3.5–5.1)
Sodium: 132 mmol/L — ABNORMAL LOW (ref 135–145)

## 2024-03-16 LAB — GLUCOSE, CAPILLARY
Glucose-Capillary: 252 mg/dL — ABNORMAL HIGH (ref 70–99)
Glucose-Capillary: 387 mg/dL — ABNORMAL HIGH (ref 70–99)
Glucose-Capillary: 217 mg/dL — ABNORMAL HIGH (ref 70–99)
Glucose-Capillary: 210 mg/dL — ABNORMAL HIGH (ref 70–99)

## 2024-03-16 LAB — TSH: TSH: 3.247 u[IU]/mL (ref 0.350–4.500)

## 2024-03-16 MED ORDER — QUETIAPINE FUMARATE 25 MG PO TABS
25.0000 mg | ORAL_TABLET | Freq: Every day | ORAL | Status: DC
  Administered 2024-03-16: 25 mg via ORAL
  Filled 2024-03-16: qty 1

## 2024-03-16 MED ORDER — ACETAMINOPHEN 650 MG RE SUPP: 650.0000 mg | Freq: Three times a day (TID) | RECTAL | Status: AC

## 2024-03-16 MED ORDER — POLYETHYLENE GLYCOL 3350 17 G PO PACK
17.0000 g | PACK | Freq: Every day | ORAL | Status: DC
  Administered 2024-03-16 – 2024-03-22 (×6): 17 g via ORAL
  Filled 2024-03-16 (×6): qty 1

## 2024-03-16 MED ORDER — MIRABEGRON ER 25 MG PO TB24
25.0000 mg | ORAL_TABLET | Freq: Every day | ORAL | Status: DC
  Administered 2024-03-16 – 2024-03-19 (×4): 25 mg via ORAL
  Filled 2024-03-16 (×4): qty 1

## 2024-03-16 MED ORDER — ACETAMINOPHEN 325 MG PO TABS
650.0000 mg | ORAL_TABLET | Freq: Three times a day (TID) | ORAL | Status: AC
  Administered 2024-03-16 – 2024-03-18 (×5): 650 mg via ORAL
  Filled 2024-03-16 (×5): qty 2

## 2024-03-16 MED ORDER — HALOPERIDOL LACTATE 5 MG/ML IJ SOLN
2.0000 mg | Freq: Once | INTRAMUSCULAR | Status: AC
  Administered 2024-03-16: 2 mg via INTRAVENOUS
  Filled 2024-03-16: qty 1

## 2024-03-16 MED ORDER — TAMSULOSIN HCL 0.4 MG PO CAPS
0.4000 mg | ORAL_CAPSULE | Freq: Every day | ORAL | Status: DC
  Administered 2024-03-16 – 2024-03-22 (×7): 0.4 mg via ORAL
  Filled 2024-03-16 (×7): qty 1

## 2024-03-16 MED ORDER — LEVOTHYROXINE SODIUM 50 MCG PO TABS
50.0000 ug | ORAL_TABLET | Freq: Every day | ORAL | Status: DC
  Administered 2024-03-16 – 2024-03-22 (×7): 50 ug via ORAL
  Filled 2024-03-16 (×7): qty 1

## 2024-03-16 MED ORDER — INSULIN ASPART 100 UNIT/ML IJ SOLN
0.0000 [IU] | Freq: Three times a day (TID) | INTRAMUSCULAR | Status: DC
  Administered 2024-03-16 – 2024-03-17 (×3): 5 [IU] via SUBCUTANEOUS
  Administered 2024-03-18: 8 [IU] via SUBCUTANEOUS
  Administered 2024-03-18: 11 [IU] via SUBCUTANEOUS
  Administered 2024-03-18: 2 [IU] via SUBCUTANEOUS
  Administered 2024-03-19: 5 [IU] via SUBCUTANEOUS
  Administered 2024-03-19: 2 [IU] via SUBCUTANEOUS
  Administered 2024-03-19: 3 [IU] via SUBCUTANEOUS
  Administered 2024-03-20 – 2024-03-21 (×4): 5 [IU] via SUBCUTANEOUS
  Administered 2024-03-21: 3 [IU] via SUBCUTANEOUS
  Administered 2024-03-21: 5 [IU] via SUBCUTANEOUS
  Administered 2024-03-22: 8 [IU] via SUBCUTANEOUS
  Administered 2024-03-22 (×2): 3 [IU] via SUBCUTANEOUS

## 2024-03-16 MED ORDER — FAMOTIDINE 20 MG PO TABS
20.0000 mg | ORAL_TABLET | Freq: Every day | ORAL | Status: DC
Start: 2024-03-16 — End: 2024-03-19
  Administered 2024-03-16 – 2024-03-19 (×4): 20 mg via ORAL
  Filled 2024-03-16 (×4): qty 1

## 2024-03-16 MED ORDER — SENNA 8.6 MG PO TABS: 1.0000 | ORAL_TABLET | Freq: Every day | ORAL | Status: DC | PRN

## 2024-03-16 NOTE — Assessment & Plan Note (Addendum)
 Na improved, likely in the setting if sepsis.

## 2024-03-16 NOTE — Assessment & Plan Note (Addendum)
 HLD: Continue home Lipitor at 40 mg daily HTN: Continue home amlodipine  5 mg daily metoprolol  25 mg daily T2DM: A1c 8.3. Added on SSI plus nightly coverage.  On Lantus  8 units daily and metformin  at home, will add back if indicated. Glaucoma: Continue home brimonidine  0.2% eye drop into L eye BID and Cosopt  bilaterally daily Overactive bladder: Chronic use of suprapubic catheter.  Continue home Myrbetriq  25 mg daily and tamsulosin  0.4 mg daily GERD: Continue home famotidine  20 mg daily

## 2024-03-16 NOTE — Assessment & Plan Note (Addendum)
 Hypothermia now resolved with stable vitals.  Will continue on IV antibiotics pending blood and urine culture results.  Chronic suprapubic catheter with normal UOP. - Continue Cefepime  due to susceptibility to Serratia marcescens (from prior culture)  - Vanc per pharmacy due to soft tissue infection - Follow blood and urine culture - Fall precautions

## 2024-03-16 NOTE — Plan of Care (Signed)

## 2024-03-16 NOTE — Evaluation (Signed)
 Occupational Therapy Evaluation Patient Details Name: Hayley Fisher MRN: [id number] DOB: 1930/09/26 Today's Date: 03/16/2024   History of Present Illness   Pt is a 89 y.o. female who presented 03/15/24 with AMS. Pt admitted with sepsis, UTI, sacral decubitus ulcer, and hypoglycemia. PMH includes HTN, T2DM, hypothyroidism, urinary incontinence with chronic indwelling urinary catheter, arthritis, CKD, glaucoma, HLD, syncope     Clinical Impressions Pt admitted for above, PTA pt lived with her daughter and has assist with all ADLs and mobility at baseline. Pt noted to have bilat hand contractures and is generally weak, needing max A+2 to safely transfer and cleanup after BM today. Pt typically bed or w/c level at home. She needs max A to total A for ADLs, assist to feed. Discussed with pt son that the daughter and SIL are able to provide needed care at home. OT to continue following pt while acute to progress as able. Patient would benefit from post acute Home OT services to help maximize functional independence in natural environment given her level of home support.      If plan is discharge home, recommend the following:   Two people to help with walking and/or transfers;Two people to help with bathing/dressing/bathroom;A lot of help with walking and/or transfers;A lot of help with bathing/dressing/bathroom;Supervision due to cognitive status;Direct supervision/assist for financial management;Assistance with feeding;Direct supervision/assist for medications management;Assistance with cooking/housework     Functional Status Assessment   Patient has had a recent decline in their functional status and demonstrates the ability to make significant improvements in function in a reasonable and predictable amount of time.     Equipment Recommendations   Other (comment) (hoyer, attempted to call daughter to confirm DME but not available)     Recommendations for Other Services          Precautions/Restrictions   Precautions Precautions: Fall;Other (comment) Recall of Precautions/Restrictions: Impaired Precaution/Restrictions Comments: suprapubic catheter; sacral ulcer Restrictions Weight Bearing Restrictions Per Provider Order: No     Mobility Bed Mobility Overal bed mobility: Needs Assistance Bed Mobility: Supine to Sit     Supine to sit: Max assist, HOB elevated, +2 for physical assistance     General bed mobility comments: Cues for pt to reposition BLEs, pt able to somewhat assist but still needs max A.    Transfers Overall transfer level: Needs assistance Equipment used: 2 person hand held assist Transfers: Bed to chair/wheelchair/BSC Sit to Stand: +2 physical assistance, +2 safety/equipment, Mod assist Stand pivot transfers: Max assist, +2 physical assistance         General transfer comment: Pt able to stand ~2 mins before fatiguing for wiping, sacral wound and pad replacemetn      Balance Overall balance assessment: Needs assistance Sitting-balance support: No upper extremity supported, Feet supported Sitting balance-Leahy Scale: Fair Sitting balance - Comments: static sitting EOB with CGA for safety Postural control: Left lateral lean Standing balance support: Bilateral upper extremity supported Standing balance-Leahy Scale: Poor                             ADL either performed or assessed with clinical judgement   ADL   Eating/Feeding: Total assistance;Sitting   Grooming: Total assistance;Sitting;Bed level           Upper Body Dressing : Total assistance       Toilet Transfer: Maximal assistance;+2 for physical assistance;+2 for safety/equipment             General  ADL Comments: pt grossly max A to total A for ALL ADLs and mobility, has family assist at home. Needs assist with feeding-bilat hand contractures.     Vision Ability to See in Adequate Light: 2 Moderately impaired Vision Assessment?: Vision  impaired- to be further tested in functional context Additional Comments: difficulty reading things, needs it presented close to face. Able to make out therapists fingers from 43ft away     Perception Perception: Not tested       Praxis Praxis: Not tested       Pertinent Vitals/Pain Pain Assessment Pain Assessment: Faces Faces Pain Scale: Hurts little more Pain Location: bottom Pain Descriptors / Indicators: Discomfort, Grimacing, Guarding, Sore Pain Intervention(s): Monitored during session, Limited activity within patient's tolerance, Repositioned     Extremity/Trunk Assessment Upper Extremity Assessment Upper Extremity Assessment: Generalized weakness;RUE deficits/detail;LUE deficits/detail RUE Deficits / Details: hand contractures, would benefit from splint vs palm protectors. MCPs/pips curled into flexion. Shoulder flex AROM ~ 60-70 deg RUE Sensation: WNL RUE Coordination: decreased fine motor;decreased gross motor LUE Deficits / Details: hand contractures, would benefit from splint vs palm protectors. MCPs/pips curled into flexion. Shoulder AROM limited ~60 deg LUE Coordination: decreased gross motor;decreased fine motor   Lower Extremity Assessment Lower Extremity Assessment: Generalized weakness RLE Deficits / Details: reports numbness in bil feet; quads MMT score of 3+/5; decreased knee flexion ROM, R>L LLE Deficits / Details: reports numbness in bil feet; quads MMT score of 3+/5; decreased knee flexion ROM, R>L   Cervical / Trunk Assessment Cervical / Trunk Assessment: Kyphotic   Communication Communication Communication: No apparent difficulties   Cognition Arousal: Alert Behavior During Therapy: Flat affect Cognition: Cognition impaired   Orientation impairments: Time (knows the year, believes it is may)                           Following commands: Impaired Following commands impaired: Follows one step commands with increased time      Cueing  General Comments   Cueing Techniques: Verbal cues;Tactile cues;Gestural cues  Pt had BM during transfer, with assist from NT and RN pt was properly cleaned with new gown donned.   Exercises     Shoulder Instructions      Home Living Family/patient expects to be discharged to:: Private residence Living Arrangements: Children;Other relatives (daughter, son-in-law, and sister) Available Help at Discharge: Family;Available 24 hours/day Type of Home: House Home Access: Stairs to enter [employer] of Steps: 6 Entrance Stairs-Rails: Right Home Layout: One level     Bathroom Shower/Tub: Sponge bathes at baseline   [employer]: Standard     Home Equipment: Wheelchair - manual   Additional Comments: Pt is questionable historian, info above and below was provided by pt. Per CM's discussion with daughter, pt does live with daughter and son-in-law (who can provide 24/7 care) and is nonambulatory at baseline, but able to transfer to/from her w/c.      Prior Functioning/Environment Prior Level of Function : Needs assist;Patient poor historian/Family not available             Mobility Comments: Pt reports needing assistance to transfer to/from w/c and assistance to propel/maneuver the w/c ADLs Comments: reports family would assist with bathing/dressing    OT Problem List: Impaired balance (sitting and/or standing);Decreased strength;Decreased activity tolerance;Pain;Impaired UE functional use;Decreased cognition;Impaired vision/perception   OT Treatment/Interventions: Self-care/ADL training;Therapeutic exercise;Patient/family education;Balance training;Therapeutic activities;DME and/or AE instruction      OT Goals(Current goals can  be found in the care plan section)   Acute Rehab OT Goals Patient Stated Goal: To go back home OT Goal Formulation: With family Time For Goal Achievement: 03/30/24 Potential to Achieve Goals: Fair   OT Frequency:  Min  2X/week    Co-evaluation              AM-PAC OT 6 Clicks Daily Activity     Outcome Measure Help from another person eating meals?: Total Help from another person taking care of personal grooming?: Total Help from another person toileting, which includes using toliet, bedpan, or urinal?: Total Help from another person bathing (including washing, rinsing, drying)?: A Lot Help from another person to put on and taking off regular upper body clothing?: Total Help from another person to put on and taking off regular lower body clothing?: Total 6 Click Score: 7   End of Session Equipment Utilized During Treatment: Gait belt Nurse Communication: Mobility status  Activity Tolerance: Patient tolerated treatment well Patient left: in chair;with call bell/phone within reach;with chair alarm set  OT Visit Diagnosis: Other abnormalities of gait and mobility (R26.89);Unsteadiness on feet (R26.81);Muscle weakness (generalized) (M62.81);Other symptoms and signs involving cognitive function;Pain Pain - part of body:  (bottom)                Time: 8794-8764 OT Time Calculation (min): 30 min Charges:  OT General Charges $OT Visit: 1 Visit OT Evaluation $OT Eval Moderate Complexity: 1 Mod OT Treatments $Therapeutic Activity: 8-22 mins  03/16/2024  AB, OTR/L  Acute Rehabilitation Services  Office: [phone]   Curtistine JONETTA Das 03/16/2024, 1:23 PM

## 2024-03-16 NOTE — Assessment & Plan Note (Addendum)
 Mental status appears to wax and wane, mostly near baseline.  Episode of agitation and confusion overnight requiring Haldol  x 1. -Prioritize redirection and consider TeleSitter if persistently agitated -RD consult

## 2024-03-16 NOTE — Plan of Care (Signed)

## 2024-03-16 NOTE — Assessment & Plan Note (Signed)
 Stage III pressure ulcer to right buttock.  Wound care consulted and provided wound care recommendations.  Mostly nonambulatory at baseline. - PT/OT to treat

## 2024-03-16 NOTE — Assessment & Plan Note (Signed)
 Resolved, CBGs >200s.

## 2024-03-17 DIAGNOSIS — N390 Urinary tract infection, site not specified: Secondary | ICD-10-CM

## 2024-03-17 DIAGNOSIS — A419 Sepsis, unspecified organism: Secondary | ICD-10-CM | POA: Diagnosis not present

## 2024-03-17 DIAGNOSIS — D649 Anemia, unspecified: Secondary | ICD-10-CM | POA: Insufficient documentation

## 2024-03-17 DIAGNOSIS — L89153 Pressure ulcer of sacral region, stage 3: Secondary | ICD-10-CM | POA: Diagnosis not present

## 2024-03-17 DIAGNOSIS — N3000 Acute cystitis without hematuria: Secondary | ICD-10-CM

## 2024-03-17 LAB — CBC
HCT: 28 % — ABNORMAL LOW (ref 36.0–46.0)
HCT: 32.5 % — ABNORMAL LOW (ref 36.0–46.0)
Hemoglobin: 9.3 g/dL — ABNORMAL LOW (ref 12.0–15.0)
Hemoglobin: 10.4 g/dL — ABNORMAL LOW (ref 12.0–15.0)
MCH: 31.5 pg (ref 26.0–34.0)
MCH: 30.5 pg (ref 26.0–34.0)
MCHC: 33.2 g/dL (ref 30.0–36.0)
MCHC: 32 g/dL (ref 30.0–36.0)
MCV: 94.9 fL (ref 80.0–100.0)
MCV: 95.3 fL (ref 80.0–100.0)
Platelets: 192 K/uL (ref 150–400)
Platelets: 237 K/uL (ref 150–400)
RBC: 2.95 MIL/uL — ABNORMAL LOW (ref 3.87–5.11)
RBC: 3.41 MIL/uL — ABNORMAL LOW (ref 3.87–5.11)
RDW: 13.7 % (ref 11.5–15.5)
RDW: 13.7 % (ref 11.5–15.5)
WBC: 3.6 K/uL — ABNORMAL LOW (ref 4.0–10.5)
WBC: 3.3 K/uL — ABNORMAL LOW (ref 4.0–10.5)
nRBC: 0 % (ref 0.0–0.2)
nRBC: 0 % (ref 0.0–0.2)

## 2024-03-17 LAB — BASIC METABOLIC PANEL WITH GFR
Anion gap: 8 (ref 5–15)
BUN: 22 mg/dL (ref 8–23)
CO2: 26 mmol/L (ref 22–32)
Calcium: 8.2 mg/dL — ABNORMAL LOW (ref 8.9–10.3)
Chloride: 103 mmol/L (ref 98–111)
Creatinine, Ser: 0.99 mg/dL (ref 0.44–1.00)
GFR, Estimated: 54 mL/min — ABNORMAL LOW (ref >60)
Glucose, Bld: 200 mg/dL — ABNORMAL HIGH (ref 70–99)
Potassium: 4.5 mmol/L (ref 3.5–5.1)
Sodium: 137 mmol/L (ref 135–145)

## 2024-03-17 LAB — GLUCOSE, CAPILLARY
Glucose-Capillary: 219 mg/dL — ABNORMAL HIGH (ref 70–99)
Glucose-Capillary: 227 mg/dL — ABNORMAL HIGH (ref 70–99)
Glucose-Capillary: 229 mg/dL — ABNORMAL HIGH (ref 70–99)
Glucose-Capillary: 175 mg/dL — ABNORMAL HIGH (ref 70–99)

## 2024-03-17 MED ORDER — QUETIAPINE FUMARATE 25 MG PO TABS
12.5000 mg | ORAL_TABLET | Freq: Every day | ORAL | Status: DC
  Administered 2024-03-17: 12.5 mg via ORAL
  Filled 2024-03-17: qty 1

## 2024-03-17 MED ORDER — VITAMIN C 500 MG PO TABS
500.0000 mg | ORAL_TABLET | Freq: Two times a day (BID) | ORAL | Status: DC
  Administered 2024-03-17 – 2024-03-22 (×11): 500 mg via ORAL
  Filled 2024-03-17 (×11): qty 1

## 2024-03-17 MED ORDER — ZINC SULFATE 220 (50 ZN) MG PO CAPS
220.0000 mg | ORAL_CAPSULE | Freq: Every day | ORAL | Status: DC
  Administered 2024-03-17 – 2024-03-18 (×2): 220 mg via ORAL
  Filled 2024-03-17 (×2): qty 1

## 2024-03-17 MED ORDER — ADULT MULTIVITAMIN W/MINERALS CH
1.0000 | ORAL_TABLET | Freq: Every day | ORAL | Status: DC
  Administered 2024-03-17 – 2024-03-18 (×2): 1 via ORAL
  Filled 2024-03-17 (×2): qty 1

## 2024-03-17 MED ORDER — GLUCERNA SHAKE PO LIQD
237.0000 mL | Freq: Three times a day (TID) | ORAL | Status: DC
  Administered 2024-03-18 – 2024-03-22 (×11): 237 mL via ORAL

## 2024-03-17 MED ORDER — PROSOURCE PLUS PO LIQD
30.0000 mL | Freq: Two times a day (BID) | ORAL | Status: DC
  Administered 2024-03-17 – 2024-03-22 (×10): 30 mL via ORAL
  Filled 2024-03-17 (×8): qty 30

## 2024-03-17 MED ORDER — INSULIN GLARGINE-YFGN 100 UNIT/ML ~~LOC~~ SOLN
4.0000 [IU] | Freq: Every day | SUBCUTANEOUS | Status: DC
  Administered 2024-03-17 – 2024-03-18 (×2): 4 [IU] via SUBCUTANEOUS
  Filled 2024-03-17 (×2): qty 0.04

## 2024-03-17 NOTE — Progress Notes (Signed)
 Initial Nutrition Assessment  DOCUMENTATION CODES:   Not applicable  INTERVENTION:   -Continue carb modified diet -Glucerna Shake po TID, each supplement provides 220 kcal and 10 grams of protein  -30 ml Prosource Plus BID, each supplement provides 100 kcals and 15 grams protein -MVI with minerals daily -500 mg vitamin C  BID -220 mg zinc  sulfate daily x 14 days -Check labs to evaluate for potential micronutrient deficiencies secondary to stage 3 pressure injury which may impede wound healing: vitamin A  -Feeding assistance with meals  NUTRITION DIAGNOSIS:   Increased nutrient needs related to wound healing as evidenced by estimated needs.  GOAL:   Patient will meet greater than or equal to 90% of their needs  MONITOR:   PO intake, Supplement acceptance  REASON FOR ASSESSMENT:   Consult Assessment of nutrition requirement/status  ASSESSMENT:   Pt with PMHx dementia, HLD, T2DM, glaucoma, overactive bladder with chronic suprapubic catheter and hypothyroidism presenting with AMS in the setting of sepsis likely secondary to sacral decubitus ulcer vs suprapubic catheter infection.  Pt  admitted with sepsis, likely urinary source. Pt also with stage 3 sacral ulcer, present on admission.   7/25- s/p BSE- regular diet with thin liquids  Reviewed I/O's: -1 L x 24 hours   UOP: 2 L x 24 hours  Pt unavailable at time of visit. Attempted to speak with pt via call to hospital room phone, however, unable to reach. RD unable to obtain further nutrition-related history or complete nutrition-focused physical exam at this time.    Per [facility] notes, pt with stage 3 pressure injury to rt buttocks. Pt is non-ambulatory and sits in a chair the majority of the day.   Per MD notes, pt's mental status waxes and wanes secondary to dementia.   Pt currently on a carb modified diet. Noted meal completions 75-100%. Case discussed with RN, who reports that she ate well yesterday, but has not eaten  anything today as she is still asleep.   Reviewed wt hx; wt has been stable over the past year. Noted per prior RD assessments in 2022 that pt has a history of malnutrition. RD unable to identify at this time, however, pt is at high risk given advanced age, dementia, and stage 3 pressure injury. Pt would greatly benefit from addition of oral nutrition supplements.   Per TOC notes, pt lives at home with 24 hour care from family PTA. Pt will likely discharge home when medically ready.  Medications reviewed and include miralax .   Lab Results  Component Value Date   HGBA1C 8.3 (H) 03/15/2024   PTA DM medications are 6 units insulin  glargine daily and 1000 mg metformin  BID. Per ADA's Standards of Medical Care of Diabetes, glycemic targets for older adults who have multiple co-morbidities, cognitive impairments, and functional dependence should be less stringent (Hgb A1c <8.0-8.5).    Labs reviewed: Mg: 1.4, CBGS: 210-387 (inpatient orders for glycemic control are 0-15 units insulin  aspart TID with meals, 0-5 units insulin  aspart daily at bedtime, and 4 units insulin  glargine daily).    Diet Order:   Diet Order             Diet Carb Modified Fluid consistency: Thin; Room service appropriate? Yes  Diet effective now                   EDUCATION NEEDS:   No education needs have been identified at this time  Skin:  Skin Assessment: Skin Integrity Issues: Skin Integrity Issues:: Stage III  Stage III: rt buttocks  Last BM:  03/17/24 (type 6)  Height:   Ht Readings from Last 1 Encounters:  03/15/24 5' 2 (1.575 m)    Weight:   Wt Readings from Last 1 Encounters:  03/15/24 61.4 kg    Ideal Body Weight:  50 kg  BMI:  Body mass index is 24.76 kg/m.  Estimated Nutritional Needs:   Kcal:  1650-1850  Protein:  80-95 grams  Fluid:  1.6-1.8 L    Margery ORN, RD, LDN, CDCES Registered Dietitian III Certified Diabetes Care and Education Specialist If unable to reach this RD,  please use RD Inpatient group chat on secure chat between hours of 8am-4 pm daily

## 2024-03-17 NOTE — Assessment & Plan Note (Addendum)
 HLD: Continue home Lipitor at 40 mg daily HTN: Continue home amlodipine  5 mg daily metoprolol  25 mg daily T2DM: A1c 8.3. SSI and at bedtime coverage  Start half of home Lantus  at 4 units. Glaucoma: Continue home brimonidine  0.2% eye drop into L eye BID and Cosopt  bilaterally daily Overactive bladder: Chronic use of suprapubic catheter.  Replaced on admission in ED.  Continue home Myrbetriq  25 mg daily and tamsulosin  0.4 mg daily GERD: Continue home famotidine  20 mg daily Hypothyroidism - continue home Synthroid  50 mcg daily

## 2024-03-17 NOTE — Assessment & Plan Note (Addendum)
 Vital signs stable.  Expect transition to oral antibiotics.  Met McGeer's criteria at admission. - Continue cefepime  through today, transition to Bactrim  or ciprofloxacin  based on prior Serratia sensitivities, day 3 of 7 antibiotic course - Vanc per pharmacy, to cover for soft tissue sacral ulcer infection; however, little evidence overlying cellulitis on exam, dc Vanc today - Follow blood and urine culture - Fall precautions

## 2024-03-17 NOTE — Assessment & Plan Note (Signed)
 Hemoglobin 9.3 from 11 today. - Repeat CBC 3 PM

## 2024-03-17 NOTE — Assessment & Plan Note (Signed)
Sodium 137

## 2024-03-17 NOTE — Assessment & Plan Note (Signed)
-   Stage III - PT/OT to eval and treat - Wound care consulted, recommendations appreciated - Clean wound with normal saline - Xeroform over wound with silicone foam dressing change daily

## 2024-03-17 NOTE — Assessment & Plan Note (Addendum)
 Improved this a.m., alert and oriented x 3. -Prioritize redirection and consider TeleSitter if persistently agitated -RD consult -Delirium precautions - Seroquel  25 mg nightly - Home brexpiprazole 

## 2024-03-17 NOTE — Plan of Care (Signed)

## 2024-03-17 NOTE — Progress Notes (Signed)
 Daily Progress Note Intern Pager: [phone]  Patient name: Hayley Fisher Medical record number: [id number] Date of birth: 1930-09-04 Age: 89 y.o. Gender: female  Primary Care Provider: Lennie Raguel MATSU, DO Consultants: None Code Status: DNR/DNI  Pt Overview and Major Events to Date:  07/25: Admitted  Medical Decision Making:  Hayley Fisher is a 89 year old female admitted for sepsis likely secondary to a urinary source now resolved in the setting of chronic suprapubic catheter and stage III sacral decubitus ulcer.  At presentation she was encephalopathic this has improved with treatment but continues to wax and wane likely secondary to chronic dementia versus delirium. Pertinent PMH/PSH includes dementia, HLD, type 2 diabetes.  Assessment & Plan Sepsis (HCC) (Resolved: 03/17/2024) Urinary tract infection with chronic suprapubic catheter Vital signs stable.  Expect transition to oral antibiotics.  Met McGeer's criteria at admission. - Continue cefepime  through today, transition to Bactrim  or ciprofloxacin  based on prior Serratia sensitivities, day 3 of 7 antibiotic course - Vanc per pharmacy, to cover for soft tissue sacral ulcer infection; however, little evidence overlying cellulitis on exam, dc Vanc today - Follow blood and urine culture - Fall precautions  Anemia Hemoglobin 9.3 from 11 today. - Repeat CBC 3 PM Sacral decubitus ulcer - Stage III - PT/OT to eval and treat - Wound care consulted, recommendations appreciated - Clean wound with normal saline - Xeroform over wound with silicone foam dressing change daily Dementia (HCC) Improved this a.m., alert and oriented x 3. -Prioritize redirection and consider TeleSitter if persistently agitated -RD consult -Delirium precautions - Seroquel  25 mg nightly - Home brexpiprazole  Hyponatremia (Resolved: 03/17/2024) Sodium 137. Chronic health problem HLD: Continue home Lipitor at 40 mg daily HTN: Continue home amlodipine  5  mg daily metoprolol  25 mg daily T2DM: A1c 8.3. SSI and at bedtime coverage  Start half of home Lantus  at 4 units. Glaucoma: Continue home brimonidine  0.2% eye drop into L eye BID and Cosopt  bilaterally daily Overactive bladder: Chronic use of suprapubic catheter.  Replaced on admission in ED.  Continue home Myrbetriq  25 mg daily and tamsulosin  0.4 mg daily GERD: Continue home famotidine  20 mg daily Hypothyroidism - continue home Synthroid  50 mcg daily   FEN/GI: Carb modified PPx: Lovenox  Dispo:SNF pending clinical improvement . Barriers include anemia.   Subjective:  No acute events overnight.  Patient reporting she is sleeping this morning and would like to sleep.  Patient's daughter Michelene present at bedside.  Reports that sacral wound has greatly improved since admission.  She feels like her mother is at her baseline in terms of mental status.  She is alert and oriented x 3 normally and right now she is just appears more tired than normal.  Daughter states that in general patient and their family would most likely not prefer to go to skilled nursing facility, due to previous experiences there.  Patient would have high level of support at home with multiple caretakers at home.  Objective: Temp:  [97.5 F (36.4 C)-98.5 F (36.9 C)] 97.5 F (36.4 C) (07/27 0451) Pulse Rate:  [70-99] 75 (07/27 0451) Resp:  [16-18] 18 (07/27 0451) BP: (92-173)/(59-93) 124/66 (07/27 0451) SpO2:  [98 %-100 %] 100 % (07/27 0451) Physical Exam: General: NAD, sleeping comfortably in the hospital bed Neuro: A&O x 3, sleepy Cardiovascular: RRR, no murmurs,  Respiratory: normal WOB on room air, CTAB, no wheezes, ronchi or rales Extremities: Moving all 4 extremities equally, no peripheral edema    Laboratory: Most recent CBC Lab Results  Component Value Date   WBC 3.6 (L) 03/17/2024   HGB 9.3 (L) 03/17/2024   HCT 28.0 (L) 03/17/2024   MCV 94.9 03/17/2024   PLT 192 03/17/2024   Most recent BMP     Latest Ref Rng & Units 03/17/2024    3:11 AM  BMP  Glucose 70 - 99 mg/dL 799   BUN 8 - 23 mg/dL 22   Creatinine 9.55 - 1.00 mg/dL 9.00   Sodium 864 - 854 mmol/L 137   Potassium 3.5 - 5.1 mmol/L 4.5   Chloride 98 - 111 mmol/L 103   CO2 22 - 32 mmol/L 26   Calcium  8.9 - 10.3 mg/dL 8.2     Imaging/Diagnostic Tests: No new imaging  Alba Sharper, MD 03/17/2024, 7:40 AM  PGY-3, [facility] FPTS Intern pager: [phone], text pages welcome Secure chat group [facility] Teaching Service

## 2024-03-17 NOTE — Discharge Instructions (Addendum)
 Dear Hayley Fisher,   Thank you for letting us  participate in your care! In this section, you will find a brief hospital admission summary of why you were admitted to the hospital, what happened during your admission, your diagnosis/diagnoses, and recommended follow up.   You were admitted for an infection likely from the catheter to your bladder were treated with IV antibiotics.  You also received care for the pressure wound on your bottom.   POST-HOSPITAL & CARE INSTRUCTIONS We recommend following up with your PCP within 1 week from being discharged from the hospital. Please let PCP/Specialists know of any changes in medications that were made which you will be able to see in the medications section of this packet. Please also follow up urologist for suprapubic catheter management  DOCTOR'S APPOINTMENTS & FOLLOW UP Future Appointments  Date Time Provider Department Center  04/04/2024  1:30 PM [facility]  05/02/2024  1:15 PM Loreda Hacker, DPM [facility]  05/03/2024 11:30 AM Shamleffer, Donell Cardinal, MD [facility] None  10/28/2024  1:40 PM [facility] ANNUAL WELLNESS VISIT [facility]     Thank you for choosing [facility]! Take care and be well!  Family Medicine Teaching Service Inpatient Team [place]  [facility]  [address] [place], [place] [address] [phone]

## 2024-03-17 NOTE — Plan of Care (Signed)
  Problem: Health Behavior/Discharge Planning: Goal: Ability to manage health-related needs will improve Outcome: Progressing   Problem: Clinical Measurements: Goal: Ability to maintain clinical measurements within normal limits will improve Outcome: Progressing Goal: Will remain free from infection Outcome: Progressing Goal: Diagnostic test results will improve Outcome: Progressing Goal: Respiratory complications will improve Outcome: Progressing Goal: Cardiovascular complication will be avoided Outcome: Progressing   Problem: Activity: Goal: Risk for activity intolerance will decrease Outcome: Progressing   Problem: Nutrition: Goal: Adequate nutrition will be maintained Outcome: Progressing   Problem: Coping: Goal: Level of anxiety will decrease Outcome: Progressing   Problem: Elimination: Goal: Will not experience complications related to bowel motility Outcome: Progressing Goal: Will not experience complications related to urinary retention Outcome: Progressing   Problem: Pain Managment: Goal: General experience of comfort will improve and/or be controlled Outcome: Progressing   Problem: Safety: Goal: Ability to remain free from injury will improve Outcome: Progressing   Problem: Skin Integrity: Goal: Risk for impaired skin integrity will decrease Outcome: Progressing   Problem: Education: Goal: Knowledge of General Education information will improve Description: Including pain rating scale, medication(s)/side effects and non-pharmacologic comfort measures Outcome: Not Progressing   Problem: Education: Goal: Ability to describe self-care measures that may prevent or decrease complications (Diabetes Survival Skills Education) will improve Outcome: Not Progressing

## 2024-03-18 ENCOUNTER — Other Ambulatory Visit (HOSPITAL_COMMUNITY): Payer: Self-pay

## 2024-03-18 ENCOUNTER — Inpatient Hospital Stay (HOSPITAL_COMMUNITY)

## 2024-03-18 ENCOUNTER — Encounter (HOSPITAL_COMMUNITY): Payer: Self-pay | Admitting: Family Medicine

## 2024-03-18 ENCOUNTER — Other Ambulatory Visit: Payer: Self-pay

## 2024-03-18 DIAGNOSIS — N3000 Acute cystitis without hematuria: Secondary | ICD-10-CM | POA: Diagnosis not present

## 2024-03-18 DIAGNOSIS — A419 Sepsis, unspecified organism: Secondary | ICD-10-CM | POA: Diagnosis not present

## 2024-03-18 DIAGNOSIS — F03A0 Unspecified dementia, mild, without behavioral disturbance, psychotic disturbance, mood disturbance, and anxiety: Secondary | ICD-10-CM

## 2024-03-18 LAB — CBC WITH DIFFERENTIAL/PLATELET
Abs Immature Granulocytes: 0.03 K/uL (ref 0.00–0.07)
Basophils Absolute: 0 K/uL (ref 0.0–0.1)
Basophils Relative: 1 %
Eosinophils Absolute: 0.1 K/uL (ref 0.0–0.5)
Eosinophils Relative: 2 %
HCT: 33.8 % — ABNORMAL LOW (ref 36.0–46.0)
Hemoglobin: 10.9 g/dL — ABNORMAL LOW (ref 12.0–15.0)
Immature Granulocytes: 1 %
Lymphocytes Relative: 22 %
Lymphs Abs: 0.7 K/uL (ref 0.7–4.0)
MCH: 30.9 pg (ref 26.0–34.0)
MCHC: 32.2 g/dL (ref 30.0–36.0)
MCV: 95.8 fL (ref 80.0–100.0)
Monocytes Absolute: 0.4 K/uL (ref 0.1–1.0)
Monocytes Relative: 11 %
Neutro Abs: 2.1 K/uL (ref 1.7–7.7)
Neutrophils Relative %: 63 %
Platelets: 243 K/uL (ref 150–400)
RBC: 3.53 MIL/uL — ABNORMAL LOW (ref 3.87–5.11)
RDW: 13.7 % (ref 11.5–15.5)
WBC: 3.3 K/uL — ABNORMAL LOW (ref 4.0–10.5)
nRBC: 0 % (ref 0.0–0.2)

## 2024-03-18 LAB — GLUCOSE, CAPILLARY
Glucose-Capillary: 265 mg/dL — ABNORMAL HIGH (ref 70–99)
Glucose-Capillary: 314 mg/dL — ABNORMAL HIGH (ref 70–99)
Glucose-Capillary: 145 mg/dL — ABNORMAL HIGH (ref 70–99)
Glucose-Capillary: 77 mg/dL (ref 70–99)

## 2024-03-18 MED ORDER — ZINC SULFATE 220 (50 ZN) MG PO CAPS
220.0000 mg | ORAL_CAPSULE | Freq: Every day | ORAL | Status: DC
  Administered 2024-03-19 – 2024-03-22 (×4): 220 mg via ORAL
  Filled 2024-03-18 (×4): qty 1

## 2024-03-18 MED ORDER — INSULIN GLARGINE-YFGN 100 UNIT/ML ~~LOC~~ SOPN: 2.0000 [IU] | PEN_INJECTOR | Freq: Once | SUBCUTANEOUS | Status: DC

## 2024-03-18 MED ORDER — ADULT MULTIVITAMIN W/MINERALS CH
1.0000 | ORAL_TABLET | Freq: Every day | ORAL | Status: DC
  Administered 2024-03-19 – 2024-03-22 (×4): 1 via ORAL
  Filled 2024-03-18 (×4): qty 1

## 2024-03-18 MED ORDER — CIPROFLOXACIN HCL 500 MG PO TABS
500.0000 mg | ORAL_TABLET | Freq: Two times a day (BID) | ORAL | Status: DC
  Administered 2024-03-18: 500 mg via ORAL
  Filled 2024-03-18 (×3): qty 1

## 2024-03-18 MED ORDER — INSULIN GLARGINE-YFGN 100 UNIT/ML ~~LOC~~ SOLN
2.0000 [IU] | Freq: Once | SUBCUTANEOUS | Status: AC
  Administered 2024-03-18: 2 [IU] via SUBCUTANEOUS
  Filled 2024-03-18: qty 0.02

## 2024-03-18 MED ORDER — INSULIN GLARGINE-YFGN 100 UNIT/ML ~~LOC~~ SOLN
6.0000 [IU] | Freq: Every day | SUBCUTANEOUS | Status: DC
  Administered 2024-03-19 – 2024-03-22 (×4): 6 [IU] via SUBCUTANEOUS
  Filled 2024-03-18 (×4): qty 0.06

## 2024-03-18 NOTE — Plan of Care (Signed)
 Spoke w/ pt's daughter Olam at bedside, she expressed concern that the patient is now drowsy and somnolent despite being awake and alert this morning.  Patient appeared somnolent but was arousing to name and direct engagement.  Patient is not regularly somnolent during the daytime at home at baseline according to patient's daughter.  Discussed that the patient had not received any medication suspected of causing this change in mental status during the day, nor was she hypoglycemic from the additional insulin  she received for her elevated blood sugars.  Patient's daughter agreeable to plan including STAT head CT, discontinuation of p.m. Seroquel , continued monitoring.

## 2024-03-18 NOTE — Assessment & Plan Note (Addendum)
 Hemoglobin 10.9. - AM CBC

## 2024-03-18 NOTE — Progress Notes (Signed)
 Daily Progress Note Intern Pager: [phone]  Patient name: Hayley Fisher Medical record number: [id number] Date of birth: 08/13/1931 Age: 89 y.o. Gender: female  Primary Care Provider: Lennie Raguel MATSU, DO Consultants: None Code Status: DNR-I  Pt Overview and Major Events to Date:  7/25-admitted 7/28-transition from IV antibiotics to oral ciprofloxacin   Medical Decision Making: 89 yo admitted for sepsis 2/2 UTI or possible stage III sacral ulcer.  She was encephalopathic and has been intermittently encephalopathic overnight, daytime alertness improved with reduction of Seroquel  dosing on 7/20 7 PM.  Pertinent medical history includes dementia, HLD, type 2 diabetes. Assessment & Plan Sepsis without acute organ dysfunction (HCC) Urinary tract infection with chronic suprapubic catheter Vital signs stable, pt intermittently hypertensive overnight. Transitioning to oral antibiotics.   - Transition to ciprofloxacin  based on prior Serratia sensitivities, day 4 of 7 antibiotic course. D/c all IV antibiotics today.  - Follow blood and urine culture, NGTD x 3d - Fall precautions  Anemia Hemoglobin 10.9. - AM CBC  Sacral decubitus ulcer - Stage III - PT/OT to eval and treat - Wound care consulted, recommendations appreciated - Clean wound with normal saline - Xeroform over wound with silicone foam dressing change daily Dementia (HCC) Improved this a.m., alert and oriented x 3. Reduced PM seroquel  to 12.5mg  due to excess AM drowsiness.  -Prioritize redirection and consider TeleSitter if persistently agitated -RD consult -Delirium precautions - Seroquel  12.5 mg nightly - Home brexpiprazole  Chronic health problem HLD: Continue home Lipitor at 40 mg daily HTN: Continue home amlodipine  5 mg daily metoprolol  25 mg daily T2DM: A1c 8.3. SSI and at bedtime coverage  Start half of home Lantus  at 4 units. Glaucoma: Continue home brimonidine  0.2% eye drop into L eye BID and Cosopt   bilaterally daily Overactive bladder: Chronic use of suprapubic catheter.  Replaced on admission in ED.  Continue home Myrbetriq  25 mg daily and tamsulosin  0.4 mg daily GERD: Continue home famotidine  20 mg daily Hypothyroidism - continue home Synthroid  50 mcg daily  FEN/GI: Carb modified PPx: Lovenox  Dispo:Home with home health tomorrow.   Subjective:  NAEON, patient communicative and without concerns this AM.  Objective: Temp:  [97.4 F (36.3 C)-98.6 F (37 C)] 97.4 F (36.3 C) (07/28 0359) Pulse Rate:  [76-96] 94 (07/28 0359) Resp:  [16-19] 19 (07/28 0359) BP: (97-158)/(62-140) 152/92 (07/28 0359) SpO2:  [98 %-100 %] 99 % (07/28 0359) Weight:  [65.3 kg] 65.3 kg (07/28 0416) Physical Exam: General: Awake, alert, NAD. Communicates clearly. Mentating well.  Cardio: RRR. Normal S1, S2. No murmur, rub, gallop. 2+ radial and dorsalis pedis pulses b/l w/ good capillary refill.  Resp: CTA bilaterally. No wheezes, rales, or rhonchi. Normal work of breathing on room air Abdomen: soft, non-tender, non-distended. No guarding, rigidity, or rebound.  Laboratory: Most recent CBC Lab Results  Component Value Date   WBC 3.3 (L) 03/18/2024   HGB 10.9 (L) 03/18/2024   HCT 33.8 (L) 03/18/2024   MCV 95.8 03/18/2024   PLT 243 03/18/2024   Most recent BMP    Latest Ref Rng & Units 03/17/2024    3:11 AM  BMP  Glucose 70 - 99 mg/dL 799   BUN 8 - 23 mg/dL 22   Creatinine 9.55 - 1.00 mg/dL 9.00   Sodium 864 - 854 mmol/L 137   Potassium 3.5 - 5.1 mmol/L 4.5   Chloride 98 - 111 mmol/L 103   CO2 22 - 32 mmol/L 26   Calcium  8.9 - 10.3 mg/dL 8.2  Imaging/Diagnostic Tests:  None  Manon Jester, DO 03/18/2024, 7:21 AM  PGY-1, [facility] FPTS Intern pager: [phone], text pages welcome Secure chat group [facility] Teaching Service

## 2024-03-18 NOTE — Progress Notes (Signed)
 Occupational Therapy Treatment Patient Details Name: Hayley Fisher MRN: [id number] DOB: [birth year] Today's Date: 03/18/2024   History of present illness Pt is a 89 y.o. female who presented 03/15/24 with AMS. Pt admitted with sepsis, UTI, sacral decubitus ulcer, and hypoglycemia. PMH includes HTN, T2DM, hypothyroidism, urinary incontinence with chronic indwelling urinary catheter, arthritis, CKD, glaucoma, HLD, syncope   OT comments  Patient lethargic initially but more alert once on EOB. Patient requiring max assist to get to EOB with patient assisting with BLEs. Sitting balance addressed while seated on EOB and performed face washing and tolerated BUE ROM to address hand contractures. Patient was assisted back to supine and BUE palm guards were provided and nursing informed on night time wear. Patient declined attempting self feeding due to increased fatigue once back in supine. Discharge recommendations continue to be appropriate. Acute OT to continue to follow to address established goals.       If plan is discharge home, recommend the following:  Two people to help with walking and/or transfers;Two people to help with bathing/dressing/bathroom;A lot of help with walking and/or transfers;A lot of help with bathing/dressing/bathroom;Supervision due to cognitive status;Direct supervision/assist for financial management;Assistance with feeding;Direct supervision/assist for medications management;Assistance with cooking/housework   Equipment Recommendations  Other (comment) (hoyer)    Recommendations for Other Services      Precautions / Restrictions Precautions Precautions: Fall;Other (comment) Recall of Precautions/Restrictions: Impaired Precaution/Restrictions Comments: suprapubic catheter; sacral ulcer Restrictions Weight Bearing Restrictions Per Provider Order: No       Mobility Bed Mobility Overal bed mobility: Needs Assistance Bed Mobility: Supine to Sit Rolling: Max assist,  Used rails   Supine to sit: Max assist, HOB elevated, +2 for physical assistance Sit to supine: Max assist   General bed mobility comments: patient assisted with moving BLEs towards EOB    Transfers Overall transfer level: Needs assistance Equipment used: 2 person hand held assist (with use of bed pads)               General transfer comment: unable to achieve full upright posture, able to clear bottom to scoot up towards [facility]     Balance Overall balance assessment: Needs assistance Sitting-balance support: No upper extremity supported, Feet supported Sitting balance-Leahy Scale: Fair Sitting balance - Comments: static sitting EOB, varying from max to contact guard during 8 min sitting EOB, focused on balance Postural control: Left lateral lean                                 ADL either performed or assessed with clinical judgement   ADL Overall ADL's : Needs assistance/impaired Eating/Feeding: Total assistance;Bed level   Grooming: Wash/dry face;Moderate assistance;Sitting Grooming Details (indicate cue type and reason): on EOB with mod assist to complete                               General ADL Comments: performed grooming seated on EOB. Lunch arrived during session but patient declined attempting to eat    Extremity/Trunk Assessment              Vision       Perception     Praxis     Communication Communication Communication: No apparent difficulties   Cognition Arousal: Lethargic Behavior During Therapy: Flat affect Cognition: Cognition impaired, Difficult to assess Difficult to assess due to: Level of arousal  Following commands: Impaired Following commands impaired: Follows one step commands with increased time      Cueing   Cueing Techniques: Verbal cues, Tactile cues, Gestural cues  Exercises      Shoulder Instructions       General Comments BUE palm guards provided  and nursing notified to allow patient to wear at night    Pertinent Vitals/ Pain       Pain Assessment Pain Assessment: Faces Faces Pain Scale: Hurts a little bit Pain Location: bottom Pain Descriptors / Indicators: Discomfort, Grimacing, Guarding, Sore Pain Intervention(s): Monitored during session, Repositioned  Home Living                                          Prior Functioning/Environment              Frequency  Min 2X/week        Progress Toward Goals  OT Goals(current goals can now be found in the care plan section)  Progress towards OT goals: Progressing toward goals  Acute Rehab OT Goals Patient Stated Goal: none stated OT Goal Formulation: With patient Time For Goal Achievement: 03/30/24 Potential to Achieve Goals: Fair ADL Goals Pt Will Perform Eating: with set-up;with supervision;sitting;with adaptive utensils Additional ADL Goal #1: Family will verbalize understanding of pressure relief techniques to reduce further skin breakdown issues Additional ADL Goal #2: Pt/family will demonstrate understanding of BUE ROM and positioning to reduce further contracture development B hands  Plan      Co-evaluation    PT/OT/SLP Co-Evaluation/Treatment: Yes Reason for Co-Treatment: To address functional/ADL transfers PT goals addressed during session: Mobility/safety with mobility OT goals addressed during session: Strengthening/ROM      AM-PAC OT 6 Clicks Daily Activity     Outcome Measure   Help from another person eating meals?: Total Help from another person taking care of personal grooming?: Total Help from another person toileting, which includes using toliet, bedpan, or urinal?: Total Help from another person bathing (including washing, rinsing, drying)?: A Lot Help from another person to put on and taking off regular upper body clothing?: Total Help from another person to put on and taking off regular lower body clothing?:  Total 6 Click Score: 7    End of Session    OT Visit Diagnosis: Other abnormalities of gait and mobility (R26.89);Unsteadiness on feet (R26.81);Muscle weakness (generalized) (M62.81);Other symptoms and signs involving cognitive function;Pain Pain - part of body:  (bottom)   Activity Tolerance Patient limited by lethargy   Patient Left in bed;with call bell/phone within reach   Nurse Communication Mobility status        Time: 8856-8785 OT Time Calculation (min): 31 min  Charges: OT General Charges $OT Visit: 1 Visit OT Treatments $Therapeutic Activity: 8-22 mins  Dick Laine, OTA Acute Rehabilitation Services  Office [phone]   Jeb LITTIE Laine 03/18/2024, 1:34 PM

## 2024-03-18 NOTE — TOC Progression Note (Signed)
 Transition of Care [facility]) - Progression Note    Patient Details  Name: Hayley Fisher MRN: [id number] Date of Birth: [birth year]  [facility] of [facility]) CM/SW Contact  Mette Southgate A [place], LCSW Phone Number: 03/18/2024, 12:38 PM  Clinical Narrative:      CSW spoke with pt's daughter Lonell regarding pt's disposition. She said that likely pt would DC back home with home care services and continue her personal care- and they are attempting to get more hours with [facility] as well as change agencies to ensure she has all her needed care covered with staff.   She said that she would follow up after talking with her sister [name] and let CSW know decision. Case manager on board to assist with home as well.    TOC will continue to follow.                    Expected Discharge Plan and Services                                               Social Drivers of Health (SDOH) Interventions SDOH Screenings   Food Insecurity: No Food Insecurity (03/16/2024)  Housing: Low Risk  (03/16/2024)  Transportation Needs: No Transportation Needs (03/16/2024)  Utilities: Not At Risk (03/16/2024)  Alcohol  Screen: Low Risk  (10/26/2023)  Depression (PHQ2-9): Medium Risk (02/08/2024)  Financial Resource Strain: Low Risk  (10/26/2023)  Physical Activity: Inactive (10/26/2023)  Social Connections: Moderately Integrated (03/16/2024)  Stress: No Stress Concern Present (10/26/2023)  Tobacco Use: Medium Risk (03/18/2024)  Health Literacy: Inadequate Health Literacy (10/26/2023)    Readmission Risk Interventions    03/15/2024    2:01 PM  Readmission Risk Prevention Plan  Transportation Screening Complete  PCP or Specialist Appt within 5-7 Days Complete  Home Care Screening Complete  Medication Review (RN CM) Complete

## 2024-03-18 NOTE — Care Management Important Message (Signed)
 Important Message  Patient Details  Name: Hayley Fisher MRN: [id number] Date of Birth: [birth year]   Important Message Given:        Glade Cuff 03/18/2024, 3:17 PM

## 2024-03-18 NOTE — Assessment & Plan Note (Signed)
 HLD: Continue home Lipitor at 40 mg daily HTN: Continue home amlodipine  5 mg daily metoprolol  25 mg daily T2DM: A1c 8.3. SSI and at bedtime coverage  Start half of home Lantus  at 4 units. Glaucoma: Continue home brimonidine  0.2% eye drop into L eye BID and Cosopt  bilaterally daily Overactive bladder: Chronic use of suprapubic catheter.  Replaced on admission in ED.  Continue home Myrbetriq  25 mg daily and tamsulosin  0.4 mg daily GERD: Continue home famotidine  20 mg daily Hypothyroidism - continue home Synthroid  50 mcg daily

## 2024-03-18 NOTE — Progress Notes (Signed)
 Physical Therapy Treatment Patient Details Name: Hayley Fisher MRN: [id number] DOB: 03/15/1931 Today's Date: 03/18/2024   History of Present Illness Pt is a 89 y.o. female who presented 03/15/24 with AMS. Pt admitted with sepsis, UTI, sacral decubitus ulcer, and hypoglycemia. PMH includes HTN, T2DM, hypothyroidism, urinary incontinence with chronic indwelling urinary catheter, arthritis, CKD, glaucoma, HLD, syncope    PT Comments  Pt more lethargic this date compared to previous session however was easily awoken and did engage/participate in co-tx session with OT. Focused on EOB sitting balance while performing ADLs with OT. Pt with excellent family support and provided they feel comfortable or can afford continued in home care pt safe for d/c home as pt w/c bound PTA. Acute PT to cont to follow.     If plan is discharge home, recommend the following: Two people to help with walking and/or transfers;Two people to help with bathing/dressing/bathroom;Assistance with cooking/housework;Direct supervision/assist for medications management;Direct supervision/assist for financial management;Assist for transportation;Help with stairs or ramp for entrance;Supervision due to cognitive status   Can travel by private vehicle     No  Equipment Recommendations  Wheelchair cushion (measurements PT);BSC/3in1;Wheelchair (measurements PT);Hospital bed;Hoyer lift (air mattress; roho cushion; unsure of current DME at home)    Recommendations for Other Services       Precautions / Restrictions Precautions Precautions: Fall;Other (comment) Recall of Precautions/Restrictions: Impaired Precaution/Restrictions Comments: suprapubic catheter; sacral ulcer Restrictions Weight Bearing Restrictions Per Provider Order: No     Mobility  Bed Mobility Overal bed mobility: Needs Assistance Bed Mobility: Supine to Sit Rolling: Max assist, Used rails   Supine to sit: Max assist, HOB elevated, +2 for physical  assistance Sit to supine: Max assist   General bed mobility comments: Cues for pt to reposition BLEs, pt able to somewhat assist but still needs max A.    Transfers Overall transfer level: Needs assistance Equipment used: 2 person hand held assist (with use of bed pad)               General transfer comment: unable to achieve full upright posture, able to clear bottom to scoot up towards [facility]    Ambulation/Gait               General Gait Details: unable at baseline   Stairs             Wheelchair Mobility     Tilt Bed    Modified Rankin (Stroke Patients Only)       Balance Overall balance assessment: Needs assistance Sitting-balance support: No upper extremity supported, Feet supported Sitting balance-Leahy Scale: Fair Sitting balance - Comments: static sitting EOB, varying from max to contact guard during 8 min sitting EOB, focused on balance Postural control: Left lateral lean                                  Communication Communication Communication: No apparent difficulties  Cognition Arousal: Lethargic Behavior During Therapy: Flat affect   PT - Cognitive impairments: No family/caregiver present to determine baseline                       PT - Cognition Comments: pt more lethargic than last session but easily awoken. Pt slow to process cues and initiate movements. Poor sequencing and awareness. Poor attention noted as well. Unsure if baseline or not. Following commands: Impaired Following commands impaired: Follows one step commands with increased time  Cueing Cueing Techniques: Verbal cues, Tactile cues, Gestural cues  Exercises      General Comments General comments (skin integrity, edema, etc.): VSS      Pertinent Vitals/Pain Pain Assessment Pain Assessment: Faces Faces Pain Scale: Hurts a little bit Pain Location: bottom Pain Descriptors / Indicators: Discomfort, Grimacing, Guarding, Sore    Home  Living                          Prior Function            PT Goals (current goals can now be found in the care plan section) Acute Rehab PT Goals Patient Stated Goal: did not state PT Goal Formulation: With patient Time For Goal Achievement: 03/29/24 Potential to Achieve Goals: Fair Progress towards PT goals: Progressing toward goals    Frequency    Min 2X/week      PT Plan      Co-evaluation PT/OT/SLP Co-Evaluation/Treatment: Yes Reason for Co-Treatment: To address functional/ADL transfers PT goals addressed during session: Mobility/safety with mobility        AM-PAC PT 6 Clicks Mobility   Outcome Measure  Help needed turning from your back to your side while in a flat bed without using bedrails?: A Lot Help needed moving from lying on your back to sitting on the side of a flat bed without using bedrails?: A Lot Help needed moving to and from a bed to a chair (including a wheelchair)?: A Lot Help needed standing up from a chair using your arms (e.g., wheelchair or bedside chair)?: A Lot Help needed to walk in hospital room?: Total Help needed climbing 3-5 steps with a railing? : Total 6 Click Score: 10    End of Session   Activity Tolerance: Patient limited by fatigue Patient left: in bed;with call bell/phone within reach;with bed alarm set;Other (comment) (rolled to R side (was just on L prior to arrival)) Nurse Communication: Mobility status PT Visit Diagnosis: Unsteadiness on feet (R26.81);Muscle weakness (generalized) (M62.81)     Time: 8857-8797 PT Time Calculation (min) (ACUTE ONLY): 20 min  Charges:    $Therapeutic Activity: 8-22 mins PT General Charges $$ ACUTE PT VISIT: 1 Visit                     Norene Ames, PT, DPT Acute Rehabilitation Services Secure chat preferred Office #: [phone]    Norene CHRISTELLA Ames 03/18/2024, 1:04 PM

## 2024-03-18 NOTE — Assessment & Plan Note (Addendum)
 Improved this a.m., alert and oriented x 3. Reduced PM seroquel  to 12.5mg  due to excess AM drowsiness.  -Prioritize redirection and consider TeleSitter if persistently agitated -RD consult -Delirium precautions - Seroquel  12.5 mg nightly - Home brexpiprazole 

## 2024-03-18 NOTE — Assessment & Plan Note (Signed)
-   Stage III - PT/OT to eval and treat - Wound care consulted, recommendations appreciated - Clean wound with normal saline - Xeroform over wound with silicone foam dressing change daily

## 2024-03-18 NOTE — Inpatient Diabetes Management (Addendum)
 Inpatient Diabetes Program Recommendations  AACE/ADA: New Consensus Statement on Inpatient Glycemic Control (2015)  Target Ranges:  Prepandial:   less than 140 mg/dL      Peak postprandial:   less than 180 mg/dL (1-2 hours)      Critically ill patients:  140 - 180 mg/dL   Lab Results  Component Value Date   GLUCAP 265 (H) 03/18/2024   HGBA1C 8.3 (H) 03/15/2024    Review of Glycemic Control  Latest Reference Range & Units 03/17/24 17:12 03/17/24 21:41 03/18/24 08:05  Glucose-Capillary 70 - 99 mg/dL 770 (H) 824 (H) 734 (H)   Diabetes history: DM 2 Outpatient Diabetes medications:  Lantus  6 units daily Metformin  1000 mg q AM Current orders for Inpatient glycemic control:  Novolog  (0-15 units) tid with meals and HS Semglee  4 units daily Inpatient Diabetes Program Recommendations:     Consider increasing Semglee  to 6 units daily.  **Per ADA's Standards of Medical Care of Diabetes, glycemic targets for older adults who have multiple co-morbidities, cognitive impairments, and functional dependence should be less stringent (Hgb A1c <8.0-8.5).    Thanks,  Randall Bullocks, RN, BC-ADM Inpatient Diabetes Coordinator Pager [phone]  (8a-5p)

## 2024-03-18 NOTE — Assessment & Plan Note (Addendum)
 Vital signs stable, pt intermittently hypertensive overnight. Transitioning to oral antibiotics.   - Transition to ciprofloxacin  based on prior Serratia sensitivities, day 4 of 7 antibiotic course. D/c all IV antibiotics today.  - Follow blood and urine culture, NGTD x 3d - Fall precautions

## 2024-03-19 ENCOUNTER — Inpatient Hospital Stay (HOSPITAL_COMMUNITY)

## 2024-03-19 ENCOUNTER — Encounter (HOSPITAL_COMMUNITY): Payer: Self-pay | Admitting: Family Medicine

## 2024-03-19 ENCOUNTER — Other Ambulatory Visit (HOSPITAL_COMMUNITY): Payer: Self-pay

## 2024-03-19 DIAGNOSIS — R400 Somnolence: Secondary | ICD-10-CM

## 2024-03-19 DIAGNOSIS — F03A0 Unspecified dementia, mild, without behavioral disturbance, psychotic disturbance, mood disturbance, and anxiety: Secondary | ICD-10-CM | POA: Diagnosis not present

## 2024-03-19 DIAGNOSIS — N3000 Acute cystitis without hematuria: Secondary | ICD-10-CM | POA: Diagnosis not present

## 2024-03-19 LAB — BASIC METABOLIC PANEL WITH GFR
Anion gap: 10 (ref 5–15)
BUN: 20 mg/dL (ref 8–23)
CO2: 26 mmol/L (ref 22–32)
Calcium: 8.6 mg/dL — ABNORMAL LOW (ref 8.9–10.3)
Chloride: 102 mmol/L (ref 98–111)
Creatinine, Ser: 0.83 mg/dL (ref 0.44–1.00)
GFR, Estimated: >60 mL/min (ref >60)
Glucose, Bld: 173 mg/dL — ABNORMAL HIGH (ref 70–99)
Potassium: 3.9 mmol/L (ref 3.5–5.1)
Sodium: 138 mmol/L (ref 135–145)

## 2024-03-19 LAB — CBC WITH DIFFERENTIAL/PLATELET
Abs Immature Granulocytes: 0.02 K/uL (ref 0.00–0.07)
Basophils Absolute: 0 K/uL (ref 0.0–0.1)
Basophils Relative: 0 %
Eosinophils Absolute: 0.1 K/uL (ref 0.0–0.5)
Eosinophils Relative: 2 %
HCT: 33.1 % — ABNORMAL LOW (ref 36.0–46.0)
Hemoglobin: 10.9 g/dL — ABNORMAL LOW (ref 12.0–15.0)
Immature Granulocytes: 1 %
Lymphocytes Relative: 16 %
Lymphs Abs: 0.6 K/uL — ABNORMAL LOW (ref 0.7–4.0)
MCH: 31.4 pg (ref 26.0–34.0)
MCHC: 32.9 g/dL (ref 30.0–36.0)
MCV: 95.4 fL (ref 80.0–100.0)
Monocytes Absolute: 0.4 K/uL (ref 0.1–1.0)
Monocytes Relative: 12 %
Neutro Abs: 2.6 K/uL (ref 1.7–7.7)
Neutrophils Relative %: 69 %
Platelets: 258 K/uL (ref 150–400)
RBC: 3.47 MIL/uL — ABNORMAL LOW (ref 3.87–5.11)
RDW: 13.8 % (ref 11.5–15.5)
WBC: 3.7 K/uL — ABNORMAL LOW (ref 4.0–10.5)
nRBC: 0 % (ref 0.0–0.2)

## 2024-03-19 LAB — FOLATE: Folate: 14.1 ng/mL (ref >5.9)

## 2024-03-19 LAB — BLOOD GAS, VENOUS
Acid-Base Excess: 1.7 mmol/L (ref 0.0–2.0)
Bicarbonate: 26.6 mmol/L (ref 20.0–28.0)
Drawn by: 73276
O2 Saturation: 84.5 %
Patient temperature: 37.2
pCO2, Ven: 42 mmHg — ABNORMAL LOW (ref 44–60)
pH, Ven: 7.41 (ref 7.25–7.43)
pO2, Ven: 51 mmHg — ABNORMAL HIGH (ref 32–45)

## 2024-03-19 LAB — GLUCOSE, CAPILLARY
Glucose-Capillary: 204 mg/dL — ABNORMAL HIGH (ref 70–99)
Glucose-Capillary: 199 mg/dL — ABNORMAL HIGH (ref 70–99)
Glucose-Capillary: 140 mg/dL — ABNORMAL HIGH (ref 70–99)
Glucose-Capillary: 195 mg/dL — ABNORMAL HIGH (ref 70–99)

## 2024-03-19 LAB — VITAMIN A: Vitamin A (Retinoic Acid): 34.5 ug/dL (ref 22.0–69.5)

## 2024-03-19 LAB — FERRITIN: Ferritin: 60 ng/mL (ref 11–307)

## 2024-03-19 LAB — VITAMIN B12: Vitamin B-12: 739 pg/mL (ref 180–914)

## 2024-03-19 LAB — TSH: TSH: 3.76 u[IU]/mL (ref 0.350–4.500)

## 2024-03-19 MED ORDER — FOSFOMYCIN TROMETHAMINE 3 G PO PACK
3.0000 g | PACK | Freq: Once | ORAL | Status: AC
  Administered 2024-03-19: 3 g via ORAL
  Filled 2024-03-19: qty 3

## 2024-03-19 MED ORDER — LACTATED RINGERS IV BOLUS
500.0000 mL | Freq: Once | INTRAVENOUS | Status: AC
  Administered 2024-03-19: 500 mL via INTRAVENOUS

## 2024-03-19 MED ORDER — FAMOTIDINE 20 MG PO TABS
10.0000 mg | ORAL_TABLET | Freq: Every day | ORAL | Status: DC
  Administered 2024-03-20 – 2024-03-22 (×3): 10 mg via ORAL
  Filled 2024-03-19 (×3): qty 1

## 2024-03-19 MED ORDER — BREXPIPRAZOLE 1 MG PO TABS
1.0000 mg | ORAL_TABLET | Freq: Every day | ORAL | Status: DC
  Administered 2024-03-19 – 2024-03-21 (×3): 1 mg via ORAL
  Filled 2024-03-19 (×4): qty 1

## 2024-03-19 NOTE — Plan of Care (Signed)
 Called patient's daughter Lonell provided update regarding current medical plan.  Explained reason for MRI and possible causes of patient's continued lethargy.  Answered all questions.

## 2024-03-19 NOTE — Progress Notes (Signed)
 Physical Therapy Treatment Patient Details Name: Hayley Fisher MRN: [id number] DOB: 30-Jun-1931 Today's Date: 03/19/2024   History of Present Illness Pt is a 89 y.o. female who presented 03/15/24 with AMS. Pt admitted with sepsis, UTI, sacral decubitus ulcer, and hypoglycemia. PMH includes HTN, T2DM, hypothyroidism, urinary incontinence with chronic indwelling urinary catheter, arthritis, CKD, glaucoma, HLD, syncope    PT Comments  Patient lethargic this pm.  Earlier RN held due to low BP but reported improved after fluid bolus.  Patient not attempting to assist with mobility and no +2 help available.  Able to get upright, though difficulty scooting to EOB without pt helping so returned to supine and asked RN to help to reposition for upright and sacral wound pressure relief.  Noted pt falling asleep prior to even answering question if wanting to eat lunch and RN stated pt had been pocketing food so did not attempt.  Will continue to provide skilled PT in hopes she will improve to return home with family support, though may need inpatient rehab (<3 hours/day) if family unable to assist.    If plan is discharge home, recommend the following: Two people to help with walking and/or transfers;Two people to help with bathing/dressing/bathroom;Assistance with cooking/housework;Direct supervision/assist for medications management;Direct supervision/assist for financial management;Assist for transportation;Help with stairs or ramp for entrance;Supervision due to cognitive status   Can travel by private vehicle     No  Equipment Recommendations  Wheelchair cushion (measurements PT);BSC/3in1;Wheelchair (measurements PT);Hospital bed;Hoyer lift (air mattress, roho cushion)    Recommendations for Other Services       Precautions / Restrictions Precautions Precautions: Fall Recall of Precautions/Restrictions: Impaired Precaution/Restrictions Comments: suprapubic catheter; sacral ulcer     Mobility   Bed Mobility Overal bed mobility: Needs Assistance Bed Mobility: Supine to Sit, Sit to Supine Rolling: Max assist   Supine to sit: Total assist Sit to supine: Max assist   General bed mobility comments: +2 not available, assisted to move legs to EOB and lift trunk and pt not assisting and eyes closed so returned to supine, RN in to assist to scoot up to [facility] and reposition with pillows to offload sacrum; attempted to encourage to eat food, though pt not able to stay awake to affirm and RN reports pt has been pocketing food    Transfers                        Ambulation/Gait                   Stairs             Wheelchair Mobility     Tilt Bed    Modified Rankin (Stroke Patients Only)       Balance Overall balance assessment: Needs assistance   Sitting balance-Leahy Scale: Zero Sitting balance - Comments: leaning to L and unable to get erect with 1 person assist                                    Communication Communication Communication: No apparent difficulties  Cognition Arousal: Lethargic Behavior During Therapy: Flat affect   PT - Cognitive impairments: No family/caregiver present to determine baseline                       PT - Cognition Comments: awakens easily but does not maintain Following commands: Impaired Following commands  impaired: Follows one step commands inconsistently, Follows one step commands with increased time    Cueing    Exercises      General Comments General comments (skin integrity, edema, etc.): wearing palm guards, pt soiled pillow with saliva so replaced and put wash cloth underneath      Pertinent Vitals/Pain Pain Assessment Pain Assessment: Faces Faces Pain Scale: Hurts a little bit Pain Location: generalized with mobility Pain Descriptors / Indicators: Grimacing, Discomfort Pain Intervention(s): Monitored during session, Repositioned    Home Living                           Prior Function            PT Goals (current goals can now be found in the care plan section) Progress towards PT goals: Not progressing toward goals - comment    Frequency    Min 2X/week      PT Plan      Co-evaluation              AM-PAC PT 6 Clicks Mobility   Outcome Measure  Help needed turning from your back to your side while in a flat bed without using bedrails?: Total Help needed moving from lying on your back to sitting on the side of a flat bed without using bedrails?: Total Help needed moving to and from a bed to a chair (including a wheelchair)?: Total Help needed standing up from a chair using your arms (e.g., wheelchair or bedside chair)?: Total Help needed to walk in hospital room?: Total Help needed climbing 3-5 steps with a railing? : Total 6 Click Score: 6    End of Session   Activity Tolerance: Patient limited by lethargy Patient left: in bed;with bed alarm set;with call bell/phone within reach Nurse Communication: Mobility status PT Visit Diagnosis: Muscle weakness (generalized) (M62.81);Other abnormalities of gait and mobility (R26.89)     Time: 8489-8473 PT Time Calculation (min) (ACUTE ONLY): 16 min  Charges:    $Therapeutic Activity: 8-22 mins PT General Charges $$ ACUTE PT VISIT: 1 Visit                     Micheline Portal, PT Acute Rehabilitation Services Office:[phone] 03/19/2024    [name] 03/19/2024, 4:50 PM

## 2024-03-19 NOTE — Plan of Care (Signed)

## 2024-03-19 NOTE — Assessment & Plan Note (Addendum)
 HLD: Continue home Lipitor at 40 mg daily HTN: Continue home amlodipine  5 mg daily metoprolol  25 mg daily T2DM: A1c 8.3. SSI and at bedtime coverage  Start half of home Lantus  at 4 units. Glaucoma: Continue home brimonidine  0.2% eye drop into L eye BID and Cosopt  bilaterally daily Overactive bladder: Chronic use of suprapubic catheter.  Replaced on admission in ED.  D/c home Myrbetriq  25 mg daily. Continue tamsulosin  0.4 mg daily GERD: Continue home famotidine  20 mg daily Hypothyroidism - continue home Synthroid  50 mcg daily

## 2024-03-19 NOTE — Assessment & Plan Note (Addendum)
 Pt alert but with decreased mental status relative to earlier in the admission. CT head negative, CBG 77 7/28pm otherwise in normal range. -Prioritize redirection and consider TeleSitter if persistently agitated -RD consult -Delirium precautions - Reduced home brexpiprazole  to 1 mg daily -MRI brain without contrast

## 2024-03-19 NOTE — Assessment & Plan Note (Signed)
 Hemoglobin 10.9. - AM CBC

## 2024-03-19 NOTE — Care Management Important Message (Signed)
 Important Message  Patient Details  Name: Hayley Fisher MRN: [id number] Date of Birth: 09-24-1930   Important Message Given:  Yes - Medicare IM     Claretta Deed 03/19/2024, 9:26 AM

## 2024-03-19 NOTE — Plan of Care (Signed)
   Problem: Education: Goal: Knowledge of General Education information will improve Description Including pain rating scale, medication(s)/side effects and non-pharmacologic comfort measures Outcome: Progressing   Problem: Clinical Measurements: Goal: Ability to maintain clinical measurements within normal limits will improve Outcome: Progressing Goal: Will remain free from infection Outcome: Progressing   Problem: Nutrition: Goal: Adequate nutrition will be maintained Outcome: Progressing

## 2024-03-19 NOTE — Assessment & Plan Note (Signed)
-   Stage III - PT/OT to eval and treat - Wound care consulted, recommendations appreciated - Clean wound with normal saline - Xeroform over wound with silicone foam dressing change daily

## 2024-03-19 NOTE — Progress Notes (Signed)
 Daily Progress Note Intern Pager: [phone]  Patient name: Hayley Fisher Medical record number: [id number] Date of birth: 1931/03/30 Age: 89 y.o. Gender: female  Primary Care Provider: Lennie Raguel MATSU, DO Consultants: None Code Status: DNR/I  Pt Overview and Major Events to Date:  7/25-admitted 7/28-transition from IV antibiotics to oral Cipro  7/29-transition from oral Cipro  to fosfomycin x 1  Medical Decision Making: 89 year old female admitted for sepsis secondary to UTI.  She was encephalopathic and has been intermittently so overnight, daytime alertness initially improved with reduction of Seroquel  but has been intermittently encephalopathic throughout the day on 7/28 and 7/29.  Further reducing medications that could be impacting her mental status including Myrbetriq , Rexulti . Medical history includes dementia, HLD, type 2 diabetes.  Assessment & Plan Urinary tract infection with chronic suprapubic catheter Sepsis without acute organ dysfunction (HCC) Vital signs stable. Transitioned to oral antibiotics.   - Change cipro  to fosfomycin 3g x 1 to complete treatment course - Follow blood and urine culture, NGTD x 4d - Fall precautions  -AM BMP, folate Dementia (HCC) Pt alert but with decreased mental status relative to earlier in the admission. CT head negative, CBG 77 7/28pm otherwise in normal range. -Prioritize redirection and consider TeleSitter if persistently agitated -RD consult -Delirium precautions - Reduced home brexpiprazole  to 1 mg daily -MRI brain without contrast Anemia Hemoglobin 10.9. - AM CBC  Sacral decubitus ulcer - Stage III - PT/OT to eval and treat - Wound care consulted, recommendations appreciated - Clean wound with normal saline - Xeroform over wound with silicone foam dressing change daily Chronic health problem HLD: Continue home Lipitor at 40 mg daily HTN: Continue home amlodipine  5 mg daily metoprolol  25 mg daily T2DM: A1c 8.3. SSI and  at bedtime coverage  Start half of home Lantus  at 4 units. Glaucoma: Continue home brimonidine  0.2% eye drop into L eye BID and Cosopt  bilaterally daily Overactive bladder: Chronic use of suprapubic catheter.  Replaced on admission in ED.  D/c home Myrbetriq  25 mg daily. Continue tamsulosin  0.4 mg daily GERD: Continue home famotidine  20 mg daily Hypothyroidism - continue home Synthroid  50 mcg daily  FEN/GI: Carb modified PPx: Lovenox  Dispo:Home w/ HHPT pending clinical improvement . Barriers include encephalopathy Workup.   Subjective:  Nurse reports pt had NAEON, did not experience any episodes of delirium. This AM she is somnolent, no family at bedside. Not alert enough to carry conversation, less alert than 7/28AM.   Objective: Temp:  [98.5 F (36.9 C)-99.1 F (37.3 C)] 99 F (37.2 C) (07/29 0603) Pulse Rate:  [78-95] 85 (07/29 0603) Resp:  [15-19] 18 (07/29 0603) BP: (93-159)/(53-130) 140/83 (07/29 0603) SpO2:  [97 %-100 %] 100 % (07/29 0603) Weight:  [66.6 kg] 66.6 kg (07/29 0500) Physical Exam: General: Awake, arouses to direct engagement, NAD. Communicates slowly, not able to carry conversation. Cardio: RRR. Normal S1, S2. No murmur, rub, gallop. No le edema.  Resp: CTA bilaterally. No wheezes, rales, or rhonchi. Normal work of breathing on room air.  Abdomen: soft, non-tender, non-distended. Normoactive BS auscultated. No guarding, rigidity, or rebound.  Neuro: Alert and oriented to self and place. Moves all extremities.    Laboratory: Most recent CBC Lab Results  Component Value Date   WBC 3.7 (L) 03/19/2024   HGB 10.9 (L) 03/19/2024   HCT 33.1 (L) 03/19/2024   MCV 95.4 03/19/2024   PLT 258 03/19/2024   Most recent BMP    Latest Ref Rng & Units 03/17/2024  3:11 AM  BMP  Glucose 70 - 99 mg/dL 799   BUN 8 - 23 mg/dL 22   Creatinine 9.55 - 1.00 mg/dL 9.00   Sodium 864 - 854 mmol/L 137   Potassium 3.5 - 5.1 mmol/L 4.5   Chloride 98 - 111 mmol/L 103   CO2 22 -  32 mmol/L 26   Calcium  8.9 - 10.3 mg/dL 8.2    Ferritin 60   Imaging/Diagnostic Tests: CT Head W/o 1. Age-related atrophy and mild to moderate cerebral white matter disease.  Manon Jester, DO 03/19/2024, 7:12 AM  PGY-1, [facility] Family Medicine FPTS Intern pager: [phone], text pages welcome Secure chat group [facility] Teaching Service

## 2024-03-19 NOTE — Assessment & Plan Note (Addendum)
 Vital signs stable. Transitioned to oral antibiotics.   - Change cipro  to fosfomycin 3g x 1 to complete treatment course - Follow blood and urine culture, NGTD x 4d - Fall precautions  -AM BMP, folate

## 2024-03-20 ENCOUNTER — Inpatient Hospital Stay (HOSPITAL_COMMUNITY)

## 2024-03-20 DIAGNOSIS — N3000 Acute cystitis without hematuria: Secondary | ICD-10-CM | POA: Diagnosis not present

## 2024-03-20 DIAGNOSIS — L89153 Pressure ulcer of sacral region, stage 3: Secondary | ICD-10-CM | POA: Diagnosis not present

## 2024-03-20 DIAGNOSIS — R4182 Altered mental status, unspecified: Secondary | ICD-10-CM

## 2024-03-20 DIAGNOSIS — F03A0 Unspecified dementia, mild, without behavioral disturbance, psychotic disturbance, mood disturbance, and anxiety: Secondary | ICD-10-CM | POA: Diagnosis not present

## 2024-03-20 LAB — BASIC METABOLIC PANEL WITH GFR
Anion gap: 8 (ref 5–15)
BUN: 21 mg/dL (ref 8–23)
CO2: 24 mmol/L (ref 22–32)
Calcium: 8.5 mg/dL — ABNORMAL LOW (ref 8.9–10.3)
Chloride: 105 mmol/L (ref 98–111)
Creatinine, Ser: 0.79 mg/dL (ref 0.44–1.00)
GFR, Estimated: >60 mL/min (ref >60)
Glucose, Bld: 261 mg/dL — ABNORMAL HIGH (ref 70–99)
Potassium: 4.1 mmol/L (ref 3.5–5.1)
Sodium: 137 mmol/L (ref 135–145)

## 2024-03-20 LAB — GLUCOSE, CAPILLARY
Glucose-Capillary: 235 mg/dL — ABNORMAL HIGH (ref 70–99)
Glucose-Capillary: 242 mg/dL — ABNORMAL HIGH (ref 70–99)
Glucose-Capillary: 225 mg/dL — ABNORMAL HIGH (ref 70–99)
Glucose-Capillary: 165 mg/dL — ABNORMAL HIGH (ref 70–99)

## 2024-03-20 LAB — CBC
HCT: 31.9 % — ABNORMAL LOW (ref 36.0–46.0)
Hemoglobin: 10.2 g/dL — ABNORMAL LOW (ref 12.0–15.0)
MCH: 31.7 pg (ref 26.0–34.0)
MCHC: 32 g/dL (ref 30.0–36.0)
MCV: 99.1 fL (ref 80.0–100.0)
Platelets: 199 K/uL (ref 150–400)
RBC: 3.22 MIL/uL — ABNORMAL LOW (ref 3.87–5.11)
RDW: 13.8 % (ref 11.5–15.5)
WBC: 4.1 K/uL (ref 4.0–10.5)
nRBC: 0 % (ref 0.0–0.2)

## 2024-03-20 LAB — CULTURE, BLOOD (SINGLE): Culture: NO GROWTH

## 2024-03-20 LAB — CULTURE, BLOOD (ROUTINE X 2)
Culture: NO GROWTH
Culture: NO GROWTH

## 2024-03-20 LAB — RPR: RPR Ser Ql: NONREACTIVE

## 2024-03-20 NOTE — Progress Notes (Signed)
 EEG complete - results pending

## 2024-03-20 NOTE — Assessment & Plan Note (Addendum)
 Vital signs stable. UTI Treatment complete s/p one day of fosfomycin.  - Follow blood and urine culture, NGTD x 5d - Fall precautions  - AM BMP

## 2024-03-20 NOTE — Assessment & Plan Note (Signed)
 Hemoglobin 10.9. - AM CBC

## 2024-03-20 NOTE — Assessment & Plan Note (Signed)
-   Stage III - PT/OT to eval and treat - Wound care consulted, recommendations appreciated - Clean wound with normal saline - Xeroform over wound with silicone foam dressing change daily

## 2024-03-20 NOTE — Assessment & Plan Note (Signed)
 HLD: Continue home Lipitor at 40 mg daily HTN: Continue home amlodipine  5 mg daily metoprolol  25 mg daily T2DM: A1c 8.3. SSI and at bedtime coverage  Start half of home Lantus  at 4 units. Glaucoma: Continue home brimonidine  0.2% eye drop into L eye BID and Cosopt  bilaterally daily Overactive bladder: Chronic use of suprapubic catheter.  Replaced on admission in ED.  D/c home Myrbetriq  25 mg daily. Continue tamsulosin  0.4 mg daily GERD: Continue home famotidine  20 mg daily Hypothyroidism - continue home Synthroid  50 mcg daily

## 2024-03-20 NOTE — Procedures (Signed)
 Patient Name: Hayley Fisher  MRN: [id number]  Epilepsy Attending: Pastor Falling  Referring Physician/Provider: No ref. provider found      Date: 03/20/2024 Duration: 23 minutes   Patient history: 89 year old female admitted for sepsis secondary to UTI, now with AMS, EEG to evaluate for seizure  Level of alertness: Lethargic   AEDs during EEG study: None   Technical aspects: This EEG study was done with scalp electrodes positioned according to the 10-20 International system of electrode placement. Electrical activity was reviewed with band pass filter of 1-70Hz , sensitivity of 7 uV/mm, display speed of 30mm/sec with a 60Hz  notched filter applied as appropriate. EEG data were recorded continuously and digitally stored.  Video monitoring was available and reviewed as appropriate.  Description: The background EEG consistent of a generalized continuous delta slowing. Sleep was not seen.  Hyperventilation and photic stimulation were not performed.     ABNORMALITY - Continuous slow, generalized   IMPRESSION: This study is suggestive of moderate diffuse encephalopathy, nonspecific etiology but likely related to sedation, toxic-metabolic etiology. No seizures or epileptiform discharges were seen throughout the recording.   Pastor Falling MD Neurology

## 2024-03-20 NOTE — Progress Notes (Addendum)
 Daily Progress Note Intern Pager: [phone]  Patient name: Hayley Fisher Medical record number: [id number] Date of birth: 05-24-1931 Age: 89 y.o. Gender: female  Primary Care Provider: Lennie Raguel MATSU, DO Consultants: None Code Status: DNR-I  Pt Overview and Major Events to Date:  7/25-admitted 7/28-transition from IV antibiotics to oral Cipro  7/29-transition from oral Cipro  to fosfomycin x 1  Medical Decision Making: 89 year old female admitted for sepsis secondary to UTI.  She was encephalopathic and has been intermittently so overnight, daytime alertness initially improved with reduction of Seroquel  but has been intermittently encephalopathic throughout the day starting on 7/28.  Further reduced medications that could be impacting her mental status including Myrbetriq , Rexulti . Head imaging and AMS lab workup have been unremarkable. Medical history includes dementia, HLD, type 2 diabetes.  Assessment & Plan Dementia (HCC) Pt somnolent this AM. CT head and MRI Brain negative, glucose 261. Concern for ongoing hospital-associated delirium. RPR pending, otherwise AMS workup has been WNL. Also considering outpatient palliative consult upon eventual discharge.  -Prioritize redirection and consider TeleSitter if persistently agitated -RD consult -Delirium precautions - Continue home brexpiprazole  1 mg daily - Spot EEG to r/o status epilepticus  Urinary tract infection with chronic suprapubic catheter Sepsis without acute organ dysfunction (HCC) Vital signs stable. UTI Treatment complete s/p one day of fosfomycin.  - Follow blood and urine culture, NGTD x 5d - Fall precautions  - AM BMP Anemia Hemoglobin 10.9. - AM CBC  Sacral decubitus ulcer - Stage III - PT/OT to eval and treat - Wound care consulted, recommendations appreciated - Clean wound with normal saline - Xeroform over wound with silicone foam dressing change daily Chronic health problem HLD: Continue home Lipitor  at 40 mg daily HTN: Continue home amlodipine  5 mg daily metoprolol  25 mg daily T2DM: A1c 8.3. SSI and at bedtime coverage  Start half of home Lantus  at 4 units. Glaucoma: Continue home brimonidine  0.2% eye drop into L eye BID and Cosopt  bilaterally daily Overactive bladder: Chronic use of suprapubic catheter.  Replaced on admission in ED.  D/c home Myrbetriq  25 mg daily. Continue tamsulosin  0.4 mg daily GERD: Continue home famotidine  20 mg daily Hypothyroidism - continue home Synthroid  50 mcg daily   FEN/GI: Carb modified PPx: Lovenox  Dispo:Home with home health today.    Subjective:  No acute events or agitated delirium overnight per nursing.  Patient remains somnolent this morning with only partial awakening with repeated prompting.  Objective: Temp:  [97.4 F (36.3 C)-99 F (37.2 C)] 98 F (36.7 C) (07/30 0413) Pulse Rate:  [74-97] 81 (07/30 0413) Resp:  [15-19] 19 (07/30 0413) BP: (95-143)/(52-94) 138/67 (07/30 0413) SpO2:  [97 %-100 %] 100 % (07/30 0413) Weight:  [66.6 kg] 66.6 kg (07/30 0500) Physical Exam: General: Somnolent, partially arouses to sternal rub and name.  Cardio: RRR. Normal S1, S2. No murmur, rub, gallop. 2+ radial and dorsalis pedis pulses b/l w/ good capillary refill. Resp: CTA bilaterally. No wheezes, rales, or rhonchi. Normal work of breathing on room air Neuro: Somnolent, oriented to person and year but otherwise unable to carry on conversation or answer questions clearly.  Moves all extremities.  Laboratory: Most recent CBC Lab Results  Component Value Date   WBC 4.1 03/20/2024   HGB 10.2 (L) 03/20/2024   HCT 31.9 (L) 03/20/2024   MCV 99.1 03/20/2024   PLT 199 03/20/2024   Most recent BMP    Latest Ref Rng & Units 03/19/2024    2:22 PM  BMP  Glucose 70 - 99 mg/dL 826   BUN 8 - 23 mg/dL 20   Creatinine 9.55 - 1.00 mg/dL 9.16   Sodium 864 - 854 mmol/L 138   Potassium 3.5 - 5.1 mmol/L 3.9   Chloride 98 - 111 mmol/L 102   CO2 22 - 32 mmol/L  26   Calcium  8.9 - 10.3 mg/dL 8.6    Folate 14 A87 260 Ferritin 60 TSH 3.7  Imaging/Diagnostic Tests:  MRI Brain: No evidence of acute intracranial abnormality.  Manon Jester, DO 03/20/2024, 7:51 AM  PGY-1, [facility] Family Medicine FPTS Intern pager: [phone], text pages welcome Secure chat group [facility] Teaching Service

## 2024-03-20 NOTE — TOC Progression Note (Addendum)
 Transition of Care [facility]) - Progression Note    Patient Details  Name: Hayley Fisher MRN: [id number] Date of Birth: Dec 04, 1930  Transition of [facility] CM/SW Contact  Rosaline JONELLE Joe, RN Phone Number: 03/20/2024, 10:33 AM  Clinical Narrative:    CM called and spoke with the patient's daughters and they state that they prefer to take the patient home with home health rather than SNF placement.  Medicare choice regarding home health services was offered to the family and they did not have a preference.  I called [facility] and [facility] accepted for [facility] services.  HH orders placed to be co-signed by MD.  Patient has all DME in the home including hospital bed and hoyer lift.  Patient is active with [facility] for personal care through [facility] services.  Patient is total assist and will need PTAR transport home when stable.  DNR placed in the chart for MD Team to sign.  RNCM with IP Care Management will continue to follow the patient for IP Care needs.  03/20/24 1139 - [facility] was unable to provide [facility] services.   I called [facility] and they accepted for PT/OT.  HH orders placed for [facility] services - co-signed by MD.                     Expected Discharge Plan and Services                                               Social Drivers of Health (SDOH) Interventions SDOH Screenings   Food Insecurity: No Food Insecurity (03/16/2024)  Housing: Low Risk  (03/16/2024)  Transportation Needs: No Transportation Needs (03/16/2024)  Utilities: Not At Risk (03/16/2024)  Alcohol  Screen: Low Risk  (10/26/2023)  Depression (PHQ2-9): Medium Risk (02/08/2024)  Financial Resource Strain: Low Risk  (10/26/2023)  Physical Activity: Inactive (10/26/2023)  Social Connections: Moderately Integrated (03/16/2024)  Stress: No Stress Concern Present (10/26/2023)  Tobacco Use: Medium Risk (03/18/2024)  Health Literacy: Inadequate Health Literacy (10/26/2023)    Readmission Risk Interventions    03/15/2024     2:01 PM  Readmission Risk Prevention Plan  Transportation Screening Complete  PCP or Specialist Appt within 5-7 Days Complete  Home Care Screening Complete  Medication Review (RN CM) Complete

## 2024-03-20 NOTE — Progress Notes (Signed)
 Occupational Therapy Treatment Patient Details Name: Hayley Fisher MRN: [id number] DOB: [birth year] Today's Date: 03/20/2024   History of present illness Pt is a 89 y.o. female who presented 03/15/24 with AMS. Pt admitted with sepsis, UTI, sacral decubitus ulcer, and hypoglycemia. PMH includes HTN, T2DM, hypothyroidism, urinary incontinence with chronic indwelling urinary catheter, arthritis, CKD, glaucoma, HLD, syncope   OT comments  Patient received in supine and able to arouse to address self feeding. Patient placed in chair position in bed and patient able to perform hand and face hygiene with min assist. Red foam applied to spoon to build up utensil and patient was able to feed self with setup and occasional min assist. Patient unable to manage cup at this time. Nurse and tech instructed on how to provided setup and assistance to allow patient to feed self. Patient positioned for comfort at end of session due to sacral wound. Discharge recommendations continue to be appropriate.  Acute OT to continue to follow to address established goals to facilitate DC to next venue of care.        If plan is discharge home, recommend the following:  Two people to help with walking and/or transfers;Two people to help with bathing/dressing/bathroom;A lot of help with walking and/or transfers;A lot of help with bathing/dressing/bathroom;Supervision due to cognitive status;Direct supervision/assist for financial management;Assistance with feeding;Direct supervision/assist for medications management;Assistance with cooking/housework   Equipment Recommendations  Other (comment) Lajean)    Recommendations for Other Services      Precautions / Restrictions Precautions Precautions: Fall Recall of Precautions/Restrictions: Impaired Precaution/Restrictions Comments: suprapubic catheter; sacral ulcer Restrictions Weight Bearing Restrictions Per Provider Order: No       Mobility Bed Mobility Overal bed  mobility: Needs Assistance Bed Mobility: Rolling Rolling: Max assist         General bed mobility comments: performed rolling in bed to address positioning    Transfers                   General transfer comment: not attempted     Balance                                           ADL either performed or assessed with clinical judgement   ADL Overall ADL's : Needs assistance/impaired Eating/Feeding: Moderate assistance;Bed level Eating/Feeding Details (indicate cue type and reason): Patient placed in chair position in bed and built up utensils. Patient able to feed self with spoon and occasional min assist and required max assist to hold cup Grooming: Wash/dry hands;Wash/dry face;Minimal assistance                                      Extremity/Trunk Assessment Upper Extremity Assessment Upper Extremity Assessment: Generalized weakness RUE Deficits / Details: hand contractures, now has Bilateral palm guards RUE Sensation: WNL RUE Coordination: decreased fine motor;decreased gross motor LUE Deficits / Details: hand contractures, now has palm guard LUE Coordination: decreased gross motor;decreased fine Systems analyst Communication Communication: No apparent difficulties   Cognition Arousal: Lethargic Behavior During Therapy: Flat affect Cognition: Cognition impaired, Difficult to assess   Orientation impairments: Time, Situation Awareness: Intellectual awareness impaired  Memory impairment (select all impairments): Short-term memory Attention impairment (select first level of impairment): Selective attention Executive functioning impairment (select all impairments): Initiation, Problem solving                   Following commands: Impaired Following commands impaired: Follows one step commands inconsistently, Follows one step commands with increased time       Cueing   Cueing Techniques: Verbal cues, Tactile cues, Gestural cues  Exercises      Shoulder Instructions       General Comments      Pertinent Vitals/ Pain       Pain Assessment Pain Assessment: Faces Faces Pain Scale: Hurts a little bit Pain Location: sacral area with positioning Pain Descriptors / Indicators: Grimacing, Discomfort Pain Intervention(s): Monitored during session, Repositioned  Home Living                                          Prior Functioning/Environment              Frequency  Min 2X/week        Progress Toward Goals  OT Goals(current goals can now be found in the care plan section)  Progress towards OT goals: Progressing toward goals  Acute Rehab OT Goals Patient Stated Goal: none stated OT Goal Formulation: With patient Time For Goal Achievement: 03/30/24 Potential to Achieve Goals: Fair ADL Goals Pt Will Perform Eating: with set-up;with supervision;sitting;with adaptive utensils Additional ADL Goal #1: Family will verbalize understanding of pressure relief techniques to reduce further skin breakdown issues Additional ADL Goal #2: Pt/family will demonstrate understanding of BUE ROM and positioning to reduce further contracture development B hands  Plan      Co-evaluation                 AM-PAC OT 6 Clicks Daily Activity     Outcome Measure   Help from another person eating meals?: A Lot Help from another person taking care of personal grooming?: Total Help from another person toileting, which includes using toliet, bedpan, or urinal?: Total Help from another person bathing (including washing, rinsing, drying)?: A Lot Help from another person to put on and taking off regular upper body clothing?: Total Help from another person to put on and taking off regular lower body clothing?: Total 6 Click Score: 8    End of Session Equipment Utilized During Treatment: Other (comment) (red foam to build up  utensils)  OT Visit Diagnosis: Other abnormalities of gait and mobility (R26.89);Unsteadiness on feet (R26.81);Muscle weakness (generalized) (M62.81);Other symptoms and signs involving cognitive function;Pain Pain - part of body:  (sacral area)   Activity Tolerance Patient tolerated treatment well   Patient Left in bed;with call bell/phone within reach   Nurse Communication Mobility status;Other (comment) (how to assist with allowing patient to feed self)        Time: 8983-8958 OT Time Calculation (min): 25 min  Charges: OT General Charges $OT Visit: 1 Visit OT Treatments $Self Care/Home Management : 23-37 mins  Dick Laine, OTA Acute Rehabilitation Services  Office [phone]   Jeb LITTIE Laine 03/20/2024, 1:17 PM

## 2024-03-20 NOTE — Plan of Care (Signed)

## 2024-03-20 NOTE — Assessment & Plan Note (Addendum)
 Pt somnolent this AM. CT head and MRI Brain negative, glucose 261. Concern for ongoing hospital-associated delirium. RPR pending, otherwise AMS workup has been WNL. Also considering outpatient palliative consult upon eventual discharge.  -Prioritize redirection and consider TeleSitter if persistently agitated -RD consult -Delirium precautions - Continue home brexpiprazole  1 mg daily - Spot EEG to r/o status epilepticus

## 2024-03-20 NOTE — Plan of Care (Signed)
   Problem: Education: Goal: Knowledge of General Education information will improve Description: Including pain rating scale, medication(s)/side effects and non-pharmacologic comfort measures Outcome: Progressing   Problem: Health Behavior/Discharge Planning: Goal: Ability to manage health-related needs will improve Outcome: Progressing   Problem: Activity: Goal: Risk for activity intolerance will decrease Outcome: Progressing

## 2024-03-21 ENCOUNTER — Other Ambulatory Visit (HOSPITAL_COMMUNITY): Payer: Self-pay

## 2024-03-21 DIAGNOSIS — N3000 Acute cystitis without hematuria: Secondary | ICD-10-CM | POA: Diagnosis not present

## 2024-03-21 LAB — GLUCOSE, CAPILLARY
Glucose-Capillary: 209 mg/dL — ABNORMAL HIGH (ref 70–99)
Glucose-Capillary: 179 mg/dL — ABNORMAL HIGH (ref 70–99)
Glucose-Capillary: 157 mg/dL — ABNORMAL HIGH (ref 70–99)
Glucose-Capillary: 187 mg/dL — ABNORMAL HIGH (ref 70–99)

## 2024-03-21 MED ORDER — ZINC SULFATE 220 (50 ZN) MG PO TABS
220.0000 mg | ORAL_TABLET | Freq: Every day | ORAL | 0 refills | Status: DC
  Filled 2024-03-21: qty 30, 30d supply, fill #0

## 2024-03-21 MED ORDER — ASCORBIC ACID 500 MG PO TABS: 500.0000 mg | ORAL_TABLET | Freq: Two times a day (BID) | ORAL | Status: DC

## 2024-03-21 MED ORDER — BREXPIPRAZOLE 1 MG PO TABS
1.0000 mg | ORAL_TABLET | Freq: Every day | ORAL | 0 refills | Status: DC
  Filled 2024-03-21: qty 30, 30d supply, fill #0

## 2024-03-21 MED ORDER — ACETAMINOPHEN 500 MG PO TABS
1000.0000 mg | ORAL_TABLET | Freq: Three times a day (TID) | ORAL | 0 refills | Status: DC | PRN
  Filled 2024-03-21: qty 30, 5d supply, fill #0

## 2024-03-21 MED ORDER — GLUCERNA SHAKE PO LIQD
237.0000 mL | Freq: Three times a day (TID) | ORAL | Status: DC
Start: 2024-03-21 — End: 2024-04-26

## 2024-03-21 MED ORDER — PROSOURCE PLUS PO LIQD: 30.0000 mL | Freq: Two times a day (BID) | ORAL | Status: DC

## 2024-03-21 MED ORDER — ADULT MULTIVITAMIN W/MINERALS CH: 1.0000 | ORAL_TABLET | Freq: Every day | ORAL | Status: DC

## 2024-03-21 MED ORDER — FAMOTIDINE 10 MG PO TABS
10.0000 mg | ORAL_TABLET | Freq: Every day | ORAL | 0 refills | Status: DC
  Filled 2024-03-21: qty 30, 30d supply, fill #0

## 2024-03-21 NOTE — Discharge Summary (Deleted)
 Family Medicine Teaching [facility] Discharge Summary  Patient name: Hayley Fisher Medical record number: [id number] Date of birth: 10/25/1930 Age: 89 y.o. Gender: female Date of Admission: 03/15/2024  Date of Discharge: 03/21/24 Admitting Physician: Kathrine Melena, DO  Primary Care Provider: Lennie Raguel MATSU, DO Consultants: None  Indication for Hospitalization: UTI  Discharge Diagnoses/Problem List:  Principal Problem for Admission: UTI, Encephalopathy Other Problems addressed during stay:  Principal Problem:   Urinary tract infection with chronic suprapubic catheter Active Problems:   Dementia (HCC)   Sepsis without acute organ dysfunction (HCC)   Chronic health problem   Sacral decubitus ulcer   Anemia   Somnolence   Brief Hospital Course:  Hayley Fisher is a 89 y.o.female with a history of dementia, HLD, T2DM, glaucoma, overactive bladder with chronic suprapubic catheter and hypothyroidism who was admitted to the [facility] Medicine Teaching Service at [facility] for sepsis secondary to urinary source. Her hospital course is detailed below:  Sepsis Presented with hypothermia (91.69F) initially requiring bair hugger and mild leukopenia (3.7) with AMS and met sepsis criteria. Suspected secondary to Serratia UTI from prior urine culture in setting of chronic suprapubic catheter; catheter was replaced in the ED on admission 7/25.  Additionally had stage III sacral decubitus ulcer as possible source as below.  Was started on abx with IV Cefepime  (7/25-7/28) and IV Vancomycin  (7/25-7/26), also s/p one dose of Zosyn  in the ED on 7/25. Was transitioned to PO abx with ciprofloxacin  500mg  once 7/28, then to fosfomycin 3g to complete course on 7/29 given concern for AMS.  Sacral Decubitus Ulcer Stage III ulcer of right buttock. Wound care consulted and provided recommendations.  AMS Dementia Known chronic with waxing and waning mental status.  Suspected to be acute on chronic in the  setting of sepsis above.  Required Haldol  x1 overnight on 7/25 due to agitation, family reports that patient is commonly delirious ON at home.  Patient continued to be lethargic after full treatment of UTI.  Further evaluation was obtained with MRI of the head and EEG which was negative, and grossly negative lab workup.  Thought to be attributed to cefepime  vs hospital-related delirium.  Home sedating and centrally acting meds were decreased such as Myrbetriq , Rexulti  was decreased to 1 mg, Seroquel  DC'd.  Patient mentation improved 7/30pm, back to baseline 7/31AM.   Other chronic conditions were medically managed with home medications and formulary alternatives as necessary (Severe dementia, HTN, HLD, T2DM, glaucoma, overactive bladder, arthritis)  PCP Follow-up Recommendations: Monitor home overnight delirium and titrate psychoactive meds carefully, in particular Rexulti  and quetiapine  were decreased this hospitalization and mirabegron  was stopped. Monitor chronic anemia OP Re-eval sacral wound and ensure proper home wound care     Results/Tests Pending at Time of Discharge:  Unresulted Labs (From admission, onward)    None       Disposition: Home w/ HHPT  Discharge Condition: Stable, mentation waxing and waning consistent w/ baseline.   Discharge Exam:  Vitals:   03/21/24 0520 03/21/24 0809  BP: 122/67 112/60  Pulse: 92 82  Resp: 16   Temp: 99 F (37.2 C) 98 F (36.7 C)  SpO2: 100% 96%   General: Awake, alert, NAD. Communicating slowly but clearly Cardio: RRR. Normal S1, S2. No murmur, rub, gallop. 2+ radial and dorsalis pedis pulses b/l w/ good capillary refill. Resp: CTA bilaterally. No wheezes, rales, or rhonchi. Normal work of breathing on room air Neuro: Alert and oriented x 4, moving all extremities.  Patient's mentation appears  to be significantly improved from prior evaluation.  Significant Procedures: Spot EEG  This study is suggestive of moderate diffuse  encephalopathy, nonspecific etiology but likely related to sedation, toxic-metabolic etiology. No seizures or epileptiform discharges were seen throughout the recording.   Significant Labs and Imaging:  Recent Labs  Lab 03/20/24 0250  WBC 4.1  HGB 10.2*  HCT 31.9*  PLT 199   Recent Labs  Lab 03/19/24 1422 03/20/24 0815  NA 138 137  K 3.9 4.1  CL 102 105  CO2 26 24  GLUCOSE 173* 261*  BUN 20 21  CREATININE 0.83 0.79  CALCIUM  8.6* 8.5*   MRI Brain No evidence of acute intracranial abnormality.  CT Head w/o 1. Age-related atrophy and mild to moderate cerebral white matter disease.  CXR Low lung volumes.  No acute cardiopulmonary findings.    Discharge Medications:  Allergies as of 03/21/2024       Reactions   Levemir  [insulin  Detemir] Itching   Tramadol  Itching, Other (See Comments)   Takes occasionally with Benadryl         Medication List     STOP taking these medications    acetaminophen -codeine  300-30 MG tablet Commonly known as: TYLENOL  #3   cephALEXin  500 MG capsule Commonly known as: KEFLEX    mirabegron  ER 25 MG Tb24 tablet Commonly known as: Myrbetriq        TAKE these medications    (feeding supplement) PROSource Plus liquid Take 30 mLs by mouth 2 (two) times daily between meals.   feeding supplement (GLUCERNA SHAKE) Liqd Take 237 mLs by mouth 3 (three) times daily between meals.   Accu-Chek Aviva Plus test strip Generic drug: glucose blood USE TO TEST BLOOD SUGAR UP TO 3 TIMES A DAY.   acetaminophen  500 MG tablet Commonly known as: TYLENOL  Take 2 tablets (1,000 mg total) by mouth every 8 (eight) hours as needed for moderate pain (pain score 4-6). What changed: when to take this   amLODipine  5 MG tablet Commonly known as: NORVASC  Take 1 tablet (5 mg total) by mouth daily.   ascorbic acid  500 MG tablet Commonly known as: VITAMIN C  Take 1 tablet (500 mg total) by mouth 2 (two) times daily.   aspirin  81 MG chewable tablet Chew  81 mg by mouth daily.   atorvastatin  40 MG tablet Commonly known as: LIPITOR TAKE 1 TABLET BY MOUTH EVERY DAY   brexpiprazole  1 MG Tabs tablet Commonly known as: REXULTI  Take 1 tablet (1 mg total) by mouth at bedtime. What changed:  medication strength how much to take   brimonidine  0.2 % ophthalmic solution Commonly known as: ALPHAGAN  Place 1 drop into the left eye 2 (two) times daily.   diclofenac  Sodium 1 % Gel Commonly known as: Voltaren  Apply 2 g topically 4 (four) times daily. Apply over the affected area.  Please avoid the genital areas. What changed:  when to take this additional instructions   dorzolamide -timolol  2-0.5 % ophthalmic solution Commonly known as: COSOPT  Place 1 drop into both eyes 2 (two) times daily.   DuoDERM CGF Extra Thin Misc Apply 1 each topically every three (3) days as needed (Change every 3-5 days, or sooner if soiled.).   famotidine  10 MG tablet Commonly known as: PEPCID  Take 1 tablet (10 mg total) by mouth daily. Start taking on: March 22, 2024 What changed:  medication strength how much to take   FeroSul 325 (65 Fe) MG tablet Generic drug: ferrous sulfate  TAKE 1 TABLET BY MOUTH DAILY WITH BREAKFAST  Insulin  Pen Needle 32G X 4 MM Misc 1 Device by Does not apply route daily in the afternoon.   Lantus  SoloStar 100 UNIT/ML Solostar Pen Generic drug: insulin  glargine Inject 6 Units into the skin daily.   levothyroxine  50 MCG tablet Commonly known as: SYNTHROID  Take 1 tablet (50 mcg total) by mouth daily.   lidocaine -prilocaine  cream Commonly known as: EMLA  Apply 1 Application topically as needed (Apply to ulcer as needed for pain.).   metFORMIN  500 MG 24 hr tablet Commonly known as: GLUCOPHAGE -XR Take 2 tablets (1,000 mg total) by mouth daily with breakfast.   metoprolol  succinate 25 MG 24 hr tablet Commonly known as: TOPROL -XL Take 1 tablet (25 mg total) by mouth daily.   multivitamin with minerals Tabs tablet Take 1  tablet by mouth daily.   polyethylene glycol powder 17 GM/SCOOP powder Commonly known as: GLYCOLAX /MIRALAX  Take 17 g by mouth in the morning and at bedtime. What changed: when to take this   psyllium 58.6 % packet Commonly known as: METAMUCIL Take 1 packet by mouth daily. What changed:  when to take this reasons to take this   Sarna lotion Generic drug: camphor-menthol  Apply 1 application  topically 4 (four) times daily as needed for itching (affected areas).   senna 8.6 MG Tabs tablet Commonly known as: SENOKOT Take 1 tablet (8.6 mg total) by mouth 2 (two) times daily. What changed:  when to take this reasons to take this   tamsulosin  0.4 MG Caps capsule Commonly known as: FLOMAX  Take 1 capsule (0.4 mg total) by mouth daily.   Vyzulta  0.024 % Soln Generic drug: Latanoprostene Bunod  Place 1 drop into both eyes at bedtime.   Zinc  Sulfate 220 (50 Zn) MG Tabs Take 1 tablet (220 mg total) by mouth daily.               Discharge Care Instructions  (From admission, onward)           Start     Ordered   03/21/24 0000  Discharge wound care:       Comments: Cleanse wound with NS, apply xeroform over wound bed and cover with silicone foam dressing.  Change daily.   Patient will benefit from Prevalon (lawson# [phone]) boots to offload pressure to bilateral heels and LALM for moisture management and pressure redistribution.  03/15/24 1348   03/21/24 1044            Discharge Instructions: Please refer to Patient Instructions section of EMR for full details.  Patient was counseled important signs and symptoms that should prompt return to medical care, changes in medications, dietary instructions, activity restrictions, and follow up appointments.   Follow-Up Appointments:  Follow-up Information     Baker, Raguel MATSU, DO Follow up.   Specialty: Family Medicine Why: 8/4 @ 1:45PM Contact information: [address] [place] [address] [phone]          [facility] Follow up.   Specialty: PALLIATIVE CARE Why: [facility] will provide Outpatient Palliative Care support in the home. Contact information: [address] [place]  [address] [phone]        Care, [facility] Follow up.   Specialty: Home Health Services Why: [facility] will provide home health services for Pt/OT.  They will call you in the next 24-48 hours to set up services. Contact information: [address] [place] [address] [phone]  Manon Jester, DO 03/21/2024, 11:37 AM PGY-1, [facility] Family Medicine

## 2024-03-21 NOTE — Assessment & Plan Note (Addendum)
 Pt's mental status much improved this AM. CT head, MRI Brain and spot EEG negative, glucose 165. AMS lab workup has been WNL.  Plan to discharge 8/1 at 5 PM per family for safe dispo. -Prioritize redirection and consider TeleSitter if persistently agitated -RD consult -Delirium precautions - Continue home brexpiprazole  1 mg daily

## 2024-03-21 NOTE — Plan of Care (Signed)
 Problem: Education: Goal: Knowledge of General Education information will improve Description: Including pain rating scale, medication(s)/side effects and non-pharmacologic comfort measures 03/21/2024 0043 by Evern Monica HERO, RN Outcome: Progressing 03/21/2024 0035 by Evern Monica HERO, RN Outcome: Progressing   Problem: Health Behavior/Discharge Planning: Goal: Ability to manage health-related needs will improve 03/21/2024 0043 by Evern Monica HERO, RN Outcome: Progressing 03/21/2024 0035 by Evern Monica HERO, RN Outcome: Progressing   Problem: Clinical Measurements: Goal: Ability to maintain clinical measurements within normal limits will improve 03/21/2024 0043 by Evern Monica HERO, RN Outcome: Progressing 03/21/2024 0035 by Evern Monica HERO, RN Outcome: Progressing Goal: Will remain free from infection 03/21/2024 0043 by Evern Monica HERO, RN Outcome: Progressing 03/21/2024 0035 by Evern Monica HERO, RN Outcome: Progressing Goal: Diagnostic test results will improve 03/21/2024 0043 by Evern Monica HERO, RN Outcome: Progressing 03/21/2024 0035 by Evern Monica HERO, RN Outcome: Progressing Goal: Respiratory complications will improve 03/21/2024 0043 by Evern Monica HERO, RN Outcome: Progressing 03/21/2024 0035 by Evern Monica HERO, RN Outcome: Progressing Goal: Cardiovascular complication will be avoided 03/21/2024 0043 by Evern Monica HERO, RN Outcome: Progressing 03/21/2024 0035 by Evern Monica HERO, RN Outcome: Progressing   Problem: Activity: Goal: Risk for activity intolerance will decrease 03/21/2024 0043 by Evern Monica HERO, RN Outcome: Progressing 03/21/2024 0035 by Evern Monica HERO, RN Outcome: Progressing   Problem: Nutrition: Goal: Adequate nutrition will be maintained 03/21/2024 0043 by Evern Monica HERO, RN Outcome: Progressing 03/21/2024 0035 by Evern Monica HERO, RN Outcome: Progressing   Problem: Coping: Goal: Level of anxiety will decrease 03/21/2024  0043 by Evern Monica HERO, RN Outcome: Progressing 03/21/2024 0035 by Evern Monica HERO, RN Outcome: Progressing   Problem: Elimination: Goal: Will not experience complications related to bowel motility 03/21/2024 0043 by Evern Monica HERO, RN Outcome: Progressing 03/21/2024 0035 by Evern Monica HERO, RN Outcome: Progressing Goal: Will not experience complications related to urinary retention 03/21/2024 0043 by Evern Monica HERO, RN Outcome: Progressing 03/21/2024 0035 by Evern Monica HERO, RN Outcome: Progressing   Problem: Pain Managment: Goal: General experience of comfort will improve and/or be controlled 03/21/2024 0043 by Evern Monica HERO, RN Outcome: Progressing 03/21/2024 0035 by Evern Monica HERO, RN Outcome: Progressing   Problem: Safety: Goal: Ability to remain free from injury will improve 03/21/2024 0043 by Evern Monica HERO, RN Outcome: Progressing 03/21/2024 0035 by Evern Monica HERO, RN Outcome: Progressing   Problem: Skin Integrity: Goal: Risk for impaired skin integrity will decrease 03/21/2024 0043 by Evern Monica HERO, RN Outcome: Progressing 03/21/2024 0035 by Evern Monica HERO, RN Outcome: Progressing   Problem: Education: Goal: Ability to describe self-care measures that may prevent or decrease complications (Diabetes Survival Skills Education) will improve 03/21/2024 0043 by Evern Monica HERO, RN Outcome: Progressing 03/21/2024 0035 by Evern Monica HERO, RN Outcome: Progressing Goal: Individualized Educational Video(s) 03/21/2024 0043 by Evern Monica HERO, RN Outcome: Progressing 03/21/2024 0035 by Evern Monica HERO, RN Outcome: Progressing   Problem: Coping: Goal: Ability to adjust to condition or change in health will improve 03/21/2024 0043 by Evern Monica HERO, RN Outcome: Progressing 03/21/2024 0035 by Evern Monica HERO, RN Outcome: Progressing   Problem: Fluid Volume: Goal: Ability to maintain a balanced intake and output will improve 03/21/2024  0043 by Evern Monica HERO, RN Outcome: Progressing 03/21/2024 0035 by Evern Monica HERO, RN Outcome: Progressing   Problem: Health Behavior/Discharge Planning: Goal: Ability to identify and utilize available resources and services will improve 03/21/2024 0043 by Evern Monica HERO, RN Outcome: Progressing 03/21/2024 0035 by  Evern Monica HERO, RN Outcome: Progressing Goal: Ability to manage health-related needs will improve 03/21/2024 0043 by Evern Monica HERO, RN Outcome: Progressing 03/21/2024 0035 by Evern Monica HERO, RN Outcome: Progressing   Problem: Metabolic: Goal: Ability to maintain appropriate glucose levels will improve 03/21/2024 0043 by Evern Monica HERO, RN Outcome: Progressing 03/21/2024 0035 by Evern Monica HERO, RN Outcome: Progressing   Problem: Nutritional: Goal: Maintenance of adequate nutrition will improve 03/21/2024 0043 by Evern Monica HERO, RN Outcome: Progressing 03/21/2024 0035 by Evern Monica HERO, RN Outcome: Progressing Goal: Progress toward achieving an optimal weight will improve 03/21/2024 0043 by Evern Monica HERO, RN Outcome: Progressing 03/21/2024 0035 by Evern Monica HERO, RN Outcome: Progressing   Problem: Skin Integrity: Goal: Risk for impaired skin integrity will decrease 03/21/2024 0043 by Evern Monica HERO, RN Outcome: Progressing 03/21/2024 0035 by Evern Monica HERO, RN Outcome: Progressing   Problem: Tissue Perfusion: Goal: Adequacy of tissue perfusion will improve 03/21/2024 0043 by Evern Monica HERO, RN Outcome: Progressing 03/21/2024 0035 by Evern Monica HERO, RN Outcome: Progressing

## 2024-03-21 NOTE — Progress Notes (Signed)
 Daily Progress Note Intern Pager: [phone]  Patient name: Hayley Fisher Medical record number: [id number] Date of birth: [birth year] Age: 89 y.o. Gender: female  Primary Care Provider: Lennie Raguel MATSU, DO Consultants: None Code Status: DNR-I  Pt Overview and Major Events to Date:  7/25-admitted 7/28-transition from IV antibiotics to oral Cipro  7/29-transition from oral Cipro  to fosfomycin x 1 7/31- Mental status improved  Medical Decision Making: 89 year old female admitted for sepsis secondary to UTI.  UTI treatment is complete and patient remained inpatient for the last 48 hours due to encephalopathy.  Mental status significantly improved this morning, plan to discharge tomorrow afternoon for safe dispo.  Pertinent history includes dementia, HLD, T2DM. Assessment & Plan Dementia (HCC) Pt's mental status much improved this AM. CT head, MRI Brain and spot EEG negative, glucose 165. AMS lab workup has been WNL.  Plan to discharge 8/1 at 5 PM per family for safe dispo. -Prioritize redirection and consider TeleSitter if persistently agitated -RD consult -Delirium precautions - Continue home brexpiprazole  1 mg daily Urinary tract infection with chronic suprapubic catheter (Resolved: 03/21/2024) Sepsis without acute organ dysfunction (HCC) Vital signs stable. UTI Treatment complete s/p one day of fosfomycin.  - Follow blood and urine culture, NGTD x 6d - Fall precautions  - AM BMP Anemia Hemoglobin 10.2. - AM CBC  Sacral decubitus ulcer - Stage III - PT/OT to eval and treat - Wound care consulted, recommendations appreciated - Clean wound with normal saline - Xeroform over wound with silicone foam dressing change daily Chronic health problem HLD: Continue home Lipitor at 40 mg daily HTN: Continue home amlodipine  5 mg daily metoprolol  25 mg daily T2DM: A1c 8.3. SSI and at bedtime coverage  Start half of home Lantus  at 4 units. Glaucoma: Continue home brimonidine  0.2% eye  drop into L eye BID and Cosopt  bilaterally daily Overactive bladder: Chronic use of suprapubic catheter.  Replaced on admission in ED.  D/c home Myrbetriq  25 mg daily. Continue tamsulosin  0.4 mg daily GERD: Continue home famotidine  20 mg daily Hypothyroidism - continue home Synthroid  50 mcg daily  FEN/GI: Carb modified PPx: Lovenox  Dispo:Home with home health tomorrow.   Subjective:  No acute events overnight.  Patient arousable this morning and able to have full conversation.  Expresses that she is ready to go home, and appreciates that the medical team helped her get straightened out.  Objective: Temp:  [97.7 F (36.5 C)-99 F (37.2 C)] 99 F (37.2 C) (07/31 0520) Pulse Rate:  [80-92] 92 (07/31 0520) Resp:  [16] 16 (07/31 0520) BP: (89-136)/(58-77) 122/67 (07/31 0520) SpO2:  [100 %] 100 % (07/31 0520) Weight:  [66.5 kg] 66.5 kg (07/31 0500) Physical Exam: General: Awake, alert, NAD. Communicating slowly but clearly Cardio: RRR. Normal S1, S2. No murmur, rub, gallop. 2+ radial and dorsalis pedis pulses b/l w/ good capillary refill. Resp: CTA bilaterally. No wheezes, rales, or rhonchi. Normal work of breathing on room air Neuro: Alert and oriented x 4, moving all extremities.  Patient's mentation appears to be significantly improved from prior evaluation.    Laboratory: Most recent CBC Lab Results  Component Value Date   WBC 4.1 03/20/2024   HGB 10.2 (L) 03/20/2024   HCT 31.9 (L) 03/20/2024   MCV 99.1 03/20/2024   PLT 199 03/20/2024   Most recent BMP    Latest Ref Rng & Units 03/20/2024    8:15 AM  BMP  Glucose 70 - 99 mg/dL 738   BUN 8 - 23 mg/dL  21   Creatinine 0.44 - 1.00 mg/dL 9.20   Sodium 864 - 854 mmol/L 137   Potassium 3.5 - 5.1 mmol/L 4.1   Chloride 98 - 111 mmol/L 105   CO2 22 - 32 mmol/L 24   Calcium  8.9 - 10.3 mg/dL 8.5    Imaging/Diagnostic Tests:  EEG:  This study is suggestive of moderate diffuse encephalopathy, nonspecific etiology but likely  related to sedation, toxic-metabolic etiology. No seizures or epileptiform discharges were seen throughout the recording.   Manon Jester, DO 03/21/2024, 7:50 AM  PGY-1, [facility] Family Medicine FPTS Intern pager: [phone], text pages welcome Secure chat group [facility] Teaching Service

## 2024-03-21 NOTE — Plan of Care (Signed)

## 2024-03-21 NOTE — Discharge Summary (Signed)
 Family Medicine Teaching [facility] Discharge Summary  Patient name: Hayley Fisher Medical record number: [id number] Date of birth: 28-Dec-1930 Age: 89 y.o. Gender: female Date of Admission: 03/15/2024  Date of Discharge: 03/21/24 Admitting Physician: Kathrine Melena, DO  Primary Care Provider: Lennie Raguel MATSU, DO Consultants: None  Indication for Hospitalization: UTI  Discharge Diagnoses/Problem List:  Principal Problem for Admission: UTI, Encephalopathy Other Problems addressed during stay:  Principal Problem:   Urinary tract infection with chronic suprapubic catheter Active Problems:   Dementia (HCC)   Sepsis without acute organ dysfunction (HCC)   Chronic health problem   Sacral decubitus ulcer   Anemia   Somnolence   Brief Hospital Course:  Hayley Fisher is a 89 y.o.female with a history of dementia, HLD, T2DM, glaucoma, overactive bladder with chronic suprapubic catheter and hypothyroidism who was admitted to the [facility] Medicine Teaching Service at [facility] for sepsis secondary to urinary source. Her hospital course is detailed below:  Sepsis Presented with hypothermia (91.31F) initially requiring bair hugger and mild leukopenia (3.7) with AMS and met sepsis criteria. Suspected secondary to Serratia UTI from prior urine culture in setting of chronic suprapubic catheter; catheter was replaced in the ED on admission 7/25.  Additionally had stage III sacral decubitus ulcer as possible source as below.  Was started on abx with IV Cefepime  (7/25-7/28) and IV Vancomycin  (7/25-7/26), also s/p one dose of Zosyn  in the ED on 7/25. Was transitioned to PO abx with ciprofloxacin  500mg  once 7/28, then to fosfomycin 3g to complete course on 7/29 given concern for AMS.  Sacral Decubitus Ulcer Stage III ulcer of right buttock. Wound care consulted and provided recommendations.  AMS Dementia Known chronic with waxing and waning mental status.  Suspected to be acute on chronic in the  setting of sepsis above.  Required Haldol  x1 overnight on 7/25 due to agitation, family reports that patient is commonly delirious ON at home.  Patient continued to be lethargic after full treatment of UTI.  Further evaluation was obtained with MRI of the head and EEG which was negative, and grossly negative lab workup.  Thought to be attributed to cefepime  vs hospital-related delirium.  Home sedating and centrally acting meds were decreased such as Myrbetriq , Rexulti  was decreased to 1 mg, Seroquel  DC'd.  Patient mentation improved 7/30pm, back to baseline 7/31AM.   Other chronic conditions were medically managed with home medications and formulary alternatives as necessary (Severe dementia, HTN, HLD, T2DM, glaucoma, overactive bladder, arthritis)  PCP Follow-up Recommendations: Monitor home overnight delirium and titrate psychoactive meds carefully, in particular Rexulti  and quetiapine  were decreased this hospitalization and mirabegron  was stopped. Monitor chronic anemia OP Re-eval sacral wound and ensure proper home wound care     Results/Tests Pending at Time of Discharge:  Unresulted Labs (From admission, onward)    None       Disposition: Home w/ HHPT  Discharge Condition: Stable, mentation waxing and waning consistent w/ baseline.   Discharge Exam:  Vitals:   03/21/24 0520 03/21/24 0809  BP: 122/67 112/60  Pulse: 92 82  Resp: 16   Temp: 99 F (37.2 C) 98 F (36.7 C)  SpO2: 100% 96%   General: Awake, alert, NAD. Communicating slowly but clearly Cardio: RRR. Normal S1, S2. No murmur, rub, gallop. 2+ radial and dorsalis pedis pulses b/l w/ good capillary refill. Resp: CTA bilaterally. No wheezes, rales, or rhonchi. Normal work of breathing on room air Neuro: Alert and oriented x 4, moving all extremities.  Patient's mentation appears  to be significantly improved from prior evaluation.  Significant Procedures: Spot EEG  This study is suggestive of moderate diffuse  encephalopathy, nonspecific etiology but likely related to sedation, toxic-metabolic etiology. No seizures or epileptiform discharges were seen throughout the recording.   Significant Labs and Imaging:  Recent Labs  Lab 03/20/24 0250  WBC 4.1  HGB 10.2*  HCT 31.9*  PLT 199   Recent Labs  Lab 03/19/24 1422 03/20/24 0815  NA 138 137  K 3.9 4.1  CL 102 105  CO2 26 24  GLUCOSE 173* 261*  BUN 20 21  CREATININE 0.83 0.79  CALCIUM  8.6* 8.5*   MRI Brain No evidence of acute intracranial abnormality.  CT Head w/o 1. Age-related atrophy and mild to moderate cerebral white matter disease.  CXR Low lung volumes.  No acute cardiopulmonary findings.    Discharge Medications:  Allergies as of 03/21/2024       Reactions   Levemir  [insulin  Detemir] Itching   Tramadol  Itching, Other (See Comments)   Takes occasionally with Benadryl         Medication List     STOP taking these medications    acetaminophen -codeine  300-30 MG tablet Commonly known as: TYLENOL  #3   cephALEXin  500 MG capsule Commonly known as: KEFLEX    mirabegron  ER 25 MG Tb24 tablet Commonly known as: Myrbetriq        TAKE these medications    (feeding supplement) PROSource Plus liquid Take 30 mLs by mouth 2 (two) times daily between meals.   feeding supplement (GLUCERNA SHAKE) Liqd Take 237 mLs by mouth 3 (three) times daily between meals.   Accu-Chek Aviva Plus test strip Generic drug: glucose blood USE TO TEST BLOOD SUGAR UP TO 3 TIMES A DAY.   acetaminophen  500 MG tablet Commonly known as: TYLENOL  Take 2 tablets (1,000 mg total) by mouth every 8 (eight) hours as needed for moderate pain (pain score 4-6). What changed: when to take this   amLODipine  5 MG tablet Commonly known as: NORVASC  Take 1 tablet (5 mg total) by mouth daily.   ascorbic acid  500 MG tablet Commonly known as: VITAMIN C  Take 1 tablet (500 mg total) by mouth 2 (two) times daily.   aspirin  81 MG chewable tablet Chew  81 mg by mouth daily.   atorvastatin  40 MG tablet Commonly known as: LIPITOR TAKE 1 TABLET BY MOUTH EVERY DAY   brexpiprazole  1 MG Tabs tablet Commonly known as: REXULTI  Take 1 tablet (1 mg total) by mouth at bedtime. What changed:  medication strength how much to take   brimonidine  0.2 % ophthalmic solution Commonly known as: ALPHAGAN  Place 1 drop into the left eye 2 (two) times daily.   diclofenac  Sodium 1 % Gel Commonly known as: Voltaren  Apply 2 g topically 4 (four) times daily. Apply over the affected area.  Please avoid the genital areas. What changed:  when to take this additional instructions   dorzolamide -timolol  2-0.5 % ophthalmic solution Commonly known as: COSOPT  Place 1 drop into both eyes 2 (two) times daily.   DuoDERM CGF Extra Thin Misc Apply 1 each topically every three (3) days as needed (Change every 3-5 days, or sooner if soiled.).   famotidine  10 MG tablet Commonly known as: PEPCID  Take 1 tablet (10 mg total) by mouth daily. Start taking on: March 22, 2024 What changed:  medication strength how much to take   FeroSul 325 (65 Fe) MG tablet Generic drug: ferrous sulfate  TAKE 1 TABLET BY MOUTH DAILY WITH BREAKFAST  Insulin  Pen Needle 32G X 4 MM Misc 1 Device by Does not apply route daily in the afternoon.   Lantus  SoloStar 100 UNIT/ML Solostar Pen Generic drug: insulin  glargine Inject 6 Units into the skin daily.   levothyroxine  50 MCG tablet Commonly known as: SYNTHROID  Take 1 tablet (50 mcg total) by mouth daily.   lidocaine -prilocaine  cream Commonly known as: EMLA  Apply 1 Application topically as needed (Apply to ulcer as needed for pain.).   metFORMIN  500 MG 24 hr tablet Commonly known as: GLUCOPHAGE -XR Take 2 tablets (1,000 mg total) by mouth daily with breakfast.   metoprolol  succinate 25 MG 24 hr tablet Commonly known as: TOPROL -XL Take 1 tablet (25 mg total) by mouth daily.   multivitamin with minerals Tabs tablet Take 1  tablet by mouth daily.   polyethylene glycol powder 17 GM/SCOOP powder Commonly known as: GLYCOLAX /MIRALAX  Take 17 g by mouth in the morning and at bedtime. What changed: when to take this   psyllium 58.6 % packet Commonly known as: METAMUCIL Take 1 packet by mouth daily. What changed:  when to take this reasons to take this   Sarna lotion Generic drug: camphor-menthol  Apply 1 application  topically 4 (four) times daily as needed for itching (affected areas).   senna 8.6 MG Tabs tablet Commonly known as: SENOKOT Take 1 tablet (8.6 mg total) by mouth 2 (two) times daily. What changed:  when to take this reasons to take this   tamsulosin  0.4 MG Caps capsule Commonly known as: FLOMAX  Take 1 capsule (0.4 mg total) by mouth daily.   Vyzulta  0.024 % Soln Generic drug: Latanoprostene Bunod  Place 1 drop into both eyes at bedtime.   Zinc  Sulfate 220 (50 Zn) MG Tabs Take 1 tablet (220 mg total) by mouth daily.               Discharge Care Instructions  (From admission, onward)           Start     Ordered   03/21/24 0000  Discharge wound care:       Comments: Cleanse wound with NS, apply xeroform over wound bed and cover with silicone foam dressing.  Change daily.   Patient will benefit from Prevalon ([id number]# [phone]) boots to offload pressure to bilateral heels and LALM for moisture management and pressure redistribution.  03/15/24 1348   03/21/24 1044            Discharge Instructions: Please refer to Patient Instructions section of EMR for full details.  Patient was counseled important signs and symptoms that should prompt return to medical care, changes in medications, dietary instructions, activity restrictions, and follow up appointments.   Follow-Up Appointments:  Follow-up Information     Baker, Raguel MATSU, DO Follow up.   Specialty: Family Medicine Why: 8/4 @ 1:45PM Contact information: [address] [place] [address] [phone]          [facility] Palliative Follow up.   Specialty: PALLIATIVE CARE Why: [facility] will provide Outpatient Palliative Care support in the home. Contact information: [address] [facility] [place]  [address] [phone]        Care, [facility] Follow up.   Specialty: Home Health Services Why: [facility] will provide home health services for Pt/OT.  They will call you in the next 24-48 hours to set up services. Contact information: [address] [place] [address] [phone]  Manon Jester, DO 03/21/2024, 12:28 PM PGY-1, [facility] Family Medicine

## 2024-03-21 NOTE — Assessment & Plan Note (Signed)
-   Stage III - PT/OT to eval and treat - Wound care consulted, recommendations appreciated - Clean wound with normal saline - Xeroform over wound with silicone foam dressing change daily

## 2024-03-21 NOTE — Assessment & Plan Note (Signed)
 Hemoglobin 10.2. - AM CBC

## 2024-03-21 NOTE — Progress Notes (Signed)
 Hand off report given to Ivana, RN who will assume patient's care.

## 2024-03-21 NOTE — Assessment & Plan Note (Signed)
 HLD: Continue home Lipitor at 40 mg daily HTN: Continue home amlodipine  5 mg daily metoprolol  25 mg daily T2DM: A1c 8.3. SSI and at bedtime coverage  Start half of home Lantus  at 4 units. Glaucoma: Continue home brimonidine  0.2% eye drop into L eye BID and Cosopt  bilaterally daily Overactive bladder: Chronic use of suprapubic catheter.  Replaced on admission in ED.  D/c home Myrbetriq  25 mg daily. Continue tamsulosin  0.4 mg daily GERD: Continue home famotidine  20 mg daily Hypothyroidism - continue home Synthroid  50 mcg daily

## 2024-03-21 NOTE — Assessment & Plan Note (Signed)
 Vital signs stable. UTI Treatment complete s/p one day of fosfomycin.  - Follow blood and urine culture, NGTD x 6d - Fall precautions  - AM BMP

## 2024-03-21 NOTE — TOC Transition Note (Signed)
 Transition of Care [facility]) - Discharge Note   Patient Details  Name: Hayley Fisher MRN: [id number] Date of Birth: 1930-11-03  Transition of Care [facility]) CM/SW Contact:  Rosaline JONELLE Joe, RN Phone Number: 03/21/2024, 11:56 AM   Clinical Narrative:    CM called and spoke with the patient's daughter, Lonell by phone and patient will have available 24 hour care starting on Friday evening at 5 pm.  Patient's daughter states that [facility] is no longer providing personal care and patient is switching to new personal care services for [facility] program.  MD Team is aware and will plan for discharge home tomorrow by PTAR at 5 pm when family is available to provide 24 hour care at the home.         Patient Goals and CMS Choice            Discharge Placement                       Discharge Plan and Services Additional resources added to the After Visit Summary for                                       Social Drivers of Health (SDOH) Interventions SDOH Screenings   Food Insecurity: No Food Insecurity (03/16/2024)  Housing: Low Risk  (03/16/2024)  Transportation Needs: No Transportation Needs (03/16/2024)  Utilities: Not At Risk (03/16/2024)  Alcohol  Screen: Low Risk  (10/26/2023)  Depression (PHQ2-9): Medium Risk (02/08/2024)  Financial Resource Strain: Low Risk  (10/26/2023)  Physical Activity: Inactive (10/26/2023)  Social Connections: Moderately Integrated (03/16/2024)  Stress: No Stress Concern Present (10/26/2023)  Tobacco Use: Medium Risk (03/18/2024)  Health Literacy: Inadequate Health Literacy (10/26/2023)     Readmission Risk Interventions    03/15/2024    2:01 PM  Readmission Risk Prevention Plan  Transportation Screening Complete  PCP or Specialist Appt within 5-7 Days Complete  Home Care Screening Complete  Medication Review (RN CM) Complete

## 2024-03-22 ENCOUNTER — Telehealth: Payer: Self-pay

## 2024-03-22 ENCOUNTER — Other Ambulatory Visit (HOSPITAL_COMMUNITY): Payer: Self-pay

## 2024-03-22 DIAGNOSIS — N3000 Acute cystitis without hematuria: Secondary | ICD-10-CM | POA: Diagnosis not present

## 2024-03-22 LAB — GLUCOSE, CAPILLARY
Glucose-Capillary: 168 mg/dL — ABNORMAL HIGH (ref 70–99)
Glucose-Capillary: 151 mg/dL — ABNORMAL HIGH (ref 70–99)
Glucose-Capillary: 292 mg/dL — ABNORMAL HIGH (ref 70–99)
Glucose-Capillary: 171 mg/dL — ABNORMAL HIGH (ref 70–99)

## 2024-03-22 NOTE — Progress Notes (Signed)
 Wound care and dressing changed done per provider's orders.

## 2024-03-22 NOTE — Assessment & Plan Note (Addendum)
 Pt's mental status improved this AM. CT head, MRI Brain and spot EEG negative, glucose 165. AMS lab workup has been WNL.  Plan to discharge today. -Prioritize redirection and consider TeleSitter if persistently agitated -Delirium precautions -Continue home brexpiprazole  1 mg daily

## 2024-03-22 NOTE — Progress Notes (Signed)
 AVS in packet for transport.

## 2024-03-22 NOTE — Plan of Care (Signed)

## 2024-03-22 NOTE — TOC Transition Note (Signed)
 Transition of Care [facility]) - Discharge Note   Patient Details  Name: Hayley Fisher MRN: [id number] Date of Birth: 1931/02/26  Transition of Care [facility]) CM/SW Contact:  Rosaline JONELLE Joe, RN Phone Number: 03/22/2024, 12:30 PM   Clinical Narrative:    CM called and spoke with the patient's daughter, [name] by phone and she is aware that patient will be discharged home today.  Daughter requests PTAR be set up for 5 pm today.  PTAR was called for transport to home and is scheduled for 5 pm today.  Daughter will be at the bedside today to receive discharge instructions.    [facility] PT/OT and daughter plans to have patient's suprapubic catheter to be changed at the urology office unless [facility] is able later on.  Darleene, RNCM with [facility] is checking on RN availability if possible in the home.  No other IP Care management needs and patient to discharge home by ambulance this evening.   Patient Goals and CMS Choice            Discharge Placement                       Discharge Plan and Services Additional resources added to the After Visit Summary for                                       Social Drivers of Health (SDOH) Interventions SDOH Screenings   Food Insecurity: No Food Insecurity (03/16/2024)  Housing: Low Risk  (03/16/2024)  Transportation Needs: No Transportation Needs (03/16/2024)  Utilities: Not At Risk (03/16/2024)  Alcohol  Screen: Low Risk  (10/26/2023)  Depression (PHQ2-9): Medium Risk (02/08/2024)  Financial Resource Strain: Low Risk  (10/26/2023)  Physical Activity: Inactive (10/26/2023)  Social Connections: Moderately Integrated (03/16/2024)  Stress: No Stress Concern Present (10/26/2023)  Tobacco Use: Medium Risk (03/18/2024)  Health Literacy: Inadequate Health Literacy (10/26/2023)     Readmission Risk Interventions    03/15/2024    2:01 PM  Readmission Risk Prevention Plan  Transportation Screening Complete  PCP or Specialist  Appt within 5-7 Days Complete  Home Care Screening Complete  Medication Review (RN CM) Complete

## 2024-03-22 NOTE — Progress Notes (Signed)
 Patient's suprapubic catheter leaking. Attempted to reposition it. Provider made aware. Will continue to monitor.

## 2024-03-22 NOTE — Assessment & Plan Note (Signed)
 HLD: Continue home Lipitor at 40 mg daily HTN: Continue home amlodipine  5 mg daily metoprolol  25 mg daily T2DM: A1c 8.3. SSI and at bedtime coverage  Start half of home Lantus  at 4 units. Glaucoma: Continue home brimonidine  0.2% eye drop into L eye BID and Cosopt  bilaterally daily Overactive bladder: Chronic use of suprapubic catheter.  Replaced on admission in ED.  D/c home Myrbetriq  25 mg daily. Continue tamsulosin  0.4 mg daily GERD: Continue home famotidine  20 mg daily Hypothyroidism - continue home Synthroid  50 mcg daily

## 2024-03-22 NOTE — Telephone Encounter (Signed)
 Tonya from [facility] calling to confirm they will be starting Palliative care once pt is discharged from the hospital.  Margit Dimes, CMA

## 2024-03-22 NOTE — Assessment & Plan Note (Addendum)
-   Stage III - Continue wound care - Clean wound with normal saline - Xeroform over wound with silicone foam dressing change daily

## 2024-03-22 NOTE — Telephone Encounter (Signed)
 Patients pharmacy [facility]) continues to send a PA request for Rexulti  2mg , take 1 tablet by mouth twice daily. Is this the correct dosage and strength? Looks like it was change to 1mg .   Could updated RX be sent to her preferred pharmacy?

## 2024-03-22 NOTE — Assessment & Plan Note (Addendum)
 Hemoglobin stable at 10.2 7/30

## 2024-03-22 NOTE — Progress Notes (Signed)
 Daily Progress Note Intern Pager: [phone]  Patient name: Hayley Fisher Medical record number: [id number] Date of birth: [birth year] Age: 89 y.o. Gender: female  Primary Care Provider: Lennie Raguel MATSU, DO Consultants: None Code Status: DNR-limited  Pt Overview and Major Events to Date:  7/25-admitted 7/28-transition from IV antibiotics to oral Cipro  7/29-transition from oral Cipro  to fosfomycin x 1 7/31- Mental status improved 8/1- discharge  Medical Decision Making: 89 year old female who was admitted for sepsis secondary to UTI and presenting with altered mental status.  Medical status has significantly improved over the past 2 days, and planning for discharge with home health today.  Assessment and Plan:  Pertinent PMH/PSH includes dementia, hyperlipidemia, type 2 diabetes mellitus.  Assessment & Plan Dementia (HCC) Pt's mental status improved this AM. CT head, MRI Brain and spot EEG negative, glucose 165. AMS lab workup has been WNL.  Plan to discharge today. -Prioritize redirection and consider TeleSitter if persistently agitated -Delirium precautions -Continue home brexpiprazole  1 mg daily Sepsis without acute organ dysfunction (HCC) Vital signs stable. UTI Treatment complete s/p one day of fosfomycin.  - Fall precautions  - BMP WNL Anemia Hemoglobin stable at 10.2 7/30 Sacral decubitus ulcer - Stage III - Continue wound care - Clean wound with normal saline - Xeroform over wound with silicone foam dressing change daily Chronic health problem HLD: Continue home Lipitor at 40 mg daily HTN: Continue home amlodipine  5 mg daily metoprolol  25 mg daily T2DM: A1c 8.3. SSI and at bedtime coverage  Start half of home Lantus  at 4 units. Glaucoma: Continue home brimonidine  0.2% eye drop into L eye BID and Cosopt  bilaterally daily Overactive bladder: Chronic use of suprapubic catheter.  Replaced on admission in ED.  D/c home Myrbetriq  25 mg daily. Continue tamsulosin  0.4  mg daily GERD: Continue home famotidine  20 mg daily Hypothyroidism - continue home Synthroid  50 mcg daily Acute cystitis without hematuria     FEN/GI: carb modified PPx: lovenox  Dispo:Home with home health today.  Subjective:  On initial interview, patient was somewhat confused and did not know where she was. However, when patient was reevaluated by the attending physician today, she was alert and oriented x 3.  There are no concerns for AMS returning, suspect she was groggy during initial interview.   Nursing was concerned about leakage from suprapubic catheter, but pad around her catheter was dry when she was examined at 9 AM today.  Per nursing, pad was last changed at 0500 we have no concerns for leakage at this time.  Patient is doing well today and will be discharged.  Objective: Temp:  [97.5 F (36.4 C)-97.9 F (36.6 C)] 97.9 F (36.6 C) (08/01 0818) Pulse Rate:  [72-80] 75 (08/01 1157) BP: (95-173)/(54-95) 95/54 (08/01 1157) SpO2:  [97 %-100 %] 100 % (08/01 1157) Weight:  [66 kg] 66 kg (08/01 0218) Physical Exam: General: No acute distress, awake and alert Cardiovascular: RRR, no murmurs or rubs Respiratory: CTA bilaterally Abdomen/GI: Soft, nontender/suprapubic catheter checked & NOT found to be leaking. Neuro: Alert and oriented x 3  Laboratory: Most recent CBC Lab Results  Component Value Date   WBC 4.1 03/20/2024   HGB 10.2 (L) 03/20/2024   HCT 31.9 (L) 03/20/2024   MCV 99.1 03/20/2024   PLT 199 03/20/2024   Most recent BMP    Latest Ref Rng & Units 03/20/2024    8:15 AM  BMP  Glucose 70 - 99 mg/dL 738   BUN 8 - 23 mg/dL 21  Creatinine 0.44 - 1.00 mg/dL 9.20   Sodium 864 - 854 mmol/L 137   Potassium 3.5 - 5.1 mmol/L 4.1   Chloride 98 - 111 mmol/L 105   CO2 22 - 32 mmol/L 24   Calcium  8.9 - 10.3 mg/dL 8.5     Imaging/Diagnostic Tests: EEG:  This study is suggestive of moderate diffuse encephalopathy, nonspecific etiology but likely related to  sedation, toxic-metabolic etiology. No seizures or epileptiform discharges were seen throughout the recording.  Idelle Nakai, DO 03/22/2024, 12:35 PM  PGY-1, [facility] FPTS Intern pager: [phone], text pages welcome Secure chat group [facility] Teaching Service

## 2024-03-22 NOTE — Assessment & Plan Note (Addendum)
 Vital signs stable. UTI Treatment complete s/p one day of fosfomycin.  - Fall precautions  - BMP WNL

## 2024-03-22 NOTE — Assessment & Plan Note (Deleted)
 SABRA

## 2024-03-22 NOTE — Progress Notes (Signed)
 Physical Therapy Treatment Patient Details Name: Hayley Fisher MRN: [id number] DOB: September 12, 1930 Today's Date: 03/22/2024   History of Present Illness Pt is a 89 y.o. female who presented 03/15/24 with AMS. Workup for sepsis, UTI, sacral decubitus ulcer, hypoglycemia. CT head, MRI Brain and spot EEG negative. PMH includes HTN, T2DM, hypothyroidism, urinary incontinence with chronic indwelling urinary catheter, arthritis, CKD, glaucoma, HLD, syncope, dementia.   PT Comments  Pt with improved alertness this session, though still quick to fatigue and requiring totalA for bed mobility/repositioning. Able to minimally participate in BLE AROM/PROM with maxA. Per chart, plan for d/c home today with necessary family support. If to remain admitted, will continue to follow acutely to address established goals.     If plan is discharge home, recommend the following: Two people to help with walking and/or transfers;Two people to help with bathing/dressing/bathroom;Assistance with cooking/housework;Direct supervision/assist for medications management;Direct supervision/assist for financial management;Assist for transportation;Help with stairs or ramp for entrance;Supervision due to cognitive status   Can travel by private vehicle     No  Equipment Recommendations  Wheelchair cushion (measurements PT);BSC/3in1;Wheelchair (measurements PT);Hospital bed;Hoyer lift (air mattress, roho cushion)    Recommendations for Other Services       Precautions / Restrictions Precautions Precautions: Fall Recall of Precautions/Restrictions: Impaired Precaution/Restrictions Comments: suprapubic catheter; sacral ulcer Restrictions Weight Bearing Restrictions Per Provider Order: No     Mobility  Bed Mobility Overal bed mobility: Needs Assistance Bed Mobility: Rolling Rolling: Total assist, Used rails         General bed mobility comments: assist pt's UEs to grip rails though difficult due to arthritic changes;  totalA to roll to L side; totalA to scoot up in bed    Transfers                        Ambulation/Gait                   Stairs             Wheelchair Mobility     Tilt Bed    Modified Rankin (Stroke Patients Only)       Balance                                            Communication    Cognition Arousal: Lethargic Behavior During Therapy: Flat affect   PT - Cognitive impairments: History of cognitive impairments                       PT - Cognition Comments: per chart, h/o dementia. oriented to self and location, state she normally gets up by herself. improved interaction, intermittent cues to keep eyes open though pt falling asleep by end of session Following commands: Impaired Following commands impaired: Follows one step commands inconsistently, Follows one step commands with increased time    Cueing Cueing Techniques: Verbal cues, Tactile cues, Gestural cues  Exercises Other Exercises Other Exercises: BLE PROM hip and knee flex/ext, pt able to perform partial range SAQ when hip held in flexion; minimal AROM ankle DF/PF Other Exercises: partial range AROM cervical R-side rotation and lateral flexion, though limited range with c/o tightness; poor tolerance for PROM/stretching    General Comments General comments (skin integrity, edema, etc.): bilateral palms/fingers cleaned and dried, dry washcloth rolls placed to capture rest of moisture.  pt left in partial L sidelying with pillows readjusted for pressure relief to sacrum and heels, attempted neutral cervical alignment though pt with preference for L lateral flexion/rotation      Pertinent Vitals/Pain Pain Assessment Pain Assessment: Faces Faces Pain Scale: Hurts a little bit Pain Location: sacral area with positioning Pain Descriptors / Indicators: Grimacing, Discomfort Pain Intervention(s): Monitored during session, Repositioned    Home Living                           Prior Function            PT Goals (current goals can now be found in the care plan section) Progress towards PT goals: Not progressing toward goals - comment (limited ability to participate)    Frequency    Min 2X/week      PT Plan      Co-evaluation              AM-PAC PT 6 Clicks Mobility   Outcome Measure  Help needed turning from your back to your side while in a flat bed without using bedrails?: Total Help needed moving from lying on your back to sitting on the side of a flat bed without using bedrails?: Total Help needed moving to and from a bed to a chair (including a wheelchair)?: Total Help needed standing up from a chair using your arms (e.g., wheelchair or bedside chair)?: Total Help needed to walk in hospital room?: Total Help needed climbing 3-5 steps with a railing? : Total 6 Click Score: 6    End of Session   Activity Tolerance: Patient limited by fatigue Patient left: in bed;with bed alarm set;with call bell/phone within reach Nurse Communication: Mobility status PT Visit Diagnosis: Muscle weakness (generalized) (M62.81);Other abnormalities of gait and mobility (R26.89)     Time: 9057-8997 PT Time Calculation (min) (ACUTE ONLY): 20 min  Charges:    $Therapeutic Activity: 8-22 mins PT General Charges $$ ACUTE PT VISIT: 1 Visit                      Darice Almas, PT, DPT Acute Rehabilitation Services  Personal: Secure Chat Rehab Office: [phone]  Darice LITTIE Almas 03/22/2024, 11:48 AM

## 2024-03-25 ENCOUNTER — Ambulatory Visit: Payer: Self-pay

## 2024-03-26 ENCOUNTER — Telehealth: Payer: Self-pay

## 2024-03-26 DIAGNOSIS — E785 Hyperlipidemia, unspecified: Secondary | ICD-10-CM | POA: Diagnosis not present

## 2024-03-26 DIAGNOSIS — G9340 Encephalopathy, unspecified: Secondary | ICD-10-CM | POA: Diagnosis not present

## 2024-03-26 DIAGNOSIS — E119 Type 2 diabetes mellitus without complications: Secondary | ICD-10-CM | POA: Diagnosis not present

## 2024-03-26 DIAGNOSIS — E039 Hypothyroidism, unspecified: Secondary | ICD-10-CM | POA: Diagnosis not present

## 2024-03-26 DIAGNOSIS — Z7984 Long term (current) use of oral hypoglycemic drugs: Secondary | ICD-10-CM | POA: Diagnosis not present

## 2024-03-26 DIAGNOSIS — D649 Anemia, unspecified: Secondary | ICD-10-CM | POA: Diagnosis not present

## 2024-03-26 DIAGNOSIS — N3281 Overactive bladder: Secondary | ICD-10-CM | POA: Diagnosis not present

## 2024-03-26 DIAGNOSIS — H409 Unspecified glaucoma: Secondary | ICD-10-CM | POA: Diagnosis not present

## 2024-03-26 DIAGNOSIS — Z9181 History of falling: Secondary | ICD-10-CM | POA: Diagnosis not present

## 2024-03-26 DIAGNOSIS — K219 Gastro-esophageal reflux disease without esophagitis: Secondary | ICD-10-CM | POA: Diagnosis not present

## 2024-03-26 DIAGNOSIS — I10 Essential (primary) hypertension: Secondary | ICD-10-CM | POA: Diagnosis not present

## 2024-03-26 DIAGNOSIS — Z794 Long term (current) use of insulin: Secondary | ICD-10-CM | POA: Diagnosis not present

## 2024-03-26 DIAGNOSIS — Z7982 Long term (current) use of aspirin: Secondary | ICD-10-CM | POA: Diagnosis not present

## 2024-03-26 DIAGNOSIS — Z466 Encounter for fitting and adjustment of urinary device: Secondary | ICD-10-CM | POA: Diagnosis not present

## 2024-03-26 DIAGNOSIS — Z435 Encounter for attention to cystostomy: Secondary | ICD-10-CM | POA: Diagnosis not present

## 2024-03-26 DIAGNOSIS — L89313 Pressure ulcer of right buttock, stage 3: Secondary | ICD-10-CM | POA: Diagnosis not present

## 2024-03-26 NOTE — Telephone Encounter (Signed)
 April RN with [facility] calls nurse line requesting verbal orders for skilled nursing and wound care.   Skilled nursing: 1x a week for 9 weeks  Wound care for pressure ulcer on right buttocks: Xeroform and boarder foam dressings every (3) days.   Verbal order given per [facility] protocol.

## 2024-03-27 ENCOUNTER — Telehealth: Payer: Self-pay

## 2024-03-27 DIAGNOSIS — E785 Hyperlipidemia, unspecified: Secondary | ICD-10-CM | POA: Diagnosis not present

## 2024-03-27 NOTE — Telephone Encounter (Signed)
 Signe PT with [facility] calls nurse line requesting verbal orders for [facility] PT as follows.   2x a week for 1 week  1x a week for 3 weeks   Verbal order given per [facility] protocol.

## 2024-04-01 ENCOUNTER — Telehealth: Payer: Self-pay

## 2024-04-01 ENCOUNTER — Ambulatory Visit (INDEPENDENT_AMBULATORY_CARE_PROVIDER_SITE_OTHER)

## 2024-04-01 VITALS — BP 122/72 | HR 92 | Ht 62.0 in

## 2024-04-01 DIAGNOSIS — F03A0 Unspecified dementia, mild, without behavioral disturbance, psychotic disturbance, mood disturbance, and anxiety: Secondary | ICD-10-CM | POA: Diagnosis not present

## 2024-04-01 DIAGNOSIS — R4189 Other symptoms and signs involving cognitive functions and awareness: Secondary | ICD-10-CM | POA: Diagnosis not present

## 2024-04-01 DIAGNOSIS — R4689 Other symptoms and signs involving appearance and behavior: Secondary | ICD-10-CM

## 2024-04-01 DIAGNOSIS — L98421 Non-pressure chronic ulcer of back limited to breakdown of skin: Secondary | ICD-10-CM

## 2024-04-01 MED ORDER — BREXPIPRAZOLE 2 MG PO TABS: 2.0000 mg | ORAL_TABLET | Freq: Every day | ORAL | 2 refills | Status: DC

## 2024-04-01 NOTE — Progress Notes (Signed)
    SUBJECTIVE:   CHIEF COMPLAINT / HPI:   Hospital follow-up  Patient is accompanied by her daughter who helps take care of her. The patient complains that her bottom is hurting but denies any other pain at this time. She has a wound on her buttocks that her daughter has been cleaning. Her daughter states she has been improving since getting out of the hospital. She has dementia and is continuing to have issues with confusion and sun-downing. Prior to hospitalization, she was on 2 mg of Rexulti  for this. They decreased her dose to 1 mg in the hospital. Patient's daughter is agreeable to going back up to her prior dosing today.   PERTINENT  PMH / PSH: dementia, sacral ulcer limited to skin breakdown   OBJECTIVE:   BP 122/72   Pulse 92   Ht 5' 2 (1.575 m)   SpO2 100%   BMI 26.61 kg/m   General: elderly appearing female, no acute distress Cardiovascular: RRR, no m/r/g Respiratory: CTAB, normal work of breathing on room air  Abdomen: bowel sounds present, soft, non-tender to palpation GU: suprapubic catheter in place, draining appropriately  Skin: sacral skin breakdown, healing well with areas of granulation tissue    ASSESSMENT/PLAN:   Assessment & Plan Mild dementia, unspecified dementia type, unspecified whether behavioral, psychotic, or mood disturbance or anxiety (HCC) Increase Rexulti  back to 2 mg today. Can consider increasing further for a maximum of 4 mg daily if needed. Recommended to daughter that they keep their appointment with Dr. McDiarmid for geriatric clinic this Thursday 04/04/24.  Skin ulcer of sacrum, limited to breakdown of skin (HCC) Healing well. Encouraged daughter who is tending to the wound, to continue with her current care.      Raguel KANDICE Lee, DO [facility]

## 2024-04-01 NOTE — Telephone Encounter (Signed)
 Spoke with patients daughter Lonell Smack to confirm that they are keeping the Aug. 14th Geri Clinic at 1:30. Mrs Smack stated that they are keeping appt, but her sister is bring Mrs. Cravens to her appt. I asked for them to bring in the Roslyn package that was sent out. If they dont have it we can give them a copy during visit.. Elvi Leventhal, CMA

## 2024-04-01 NOTE — Patient Instructions (Addendum)
 I increased your Rexulti  to 2 mg today. Keep your appointment with the Geriatric clinic. Come back and see us  in 2 months.

## 2024-04-02 ENCOUNTER — Other Ambulatory Visit: Payer: Self-pay

## 2024-04-02 DIAGNOSIS — E119 Type 2 diabetes mellitus without complications: Secondary | ICD-10-CM | POA: Diagnosis not present

## 2024-04-02 DIAGNOSIS — N3281 Overactive bladder: Secondary | ICD-10-CM | POA: Diagnosis not present

## 2024-04-02 DIAGNOSIS — G9340 Encephalopathy, unspecified: Secondary | ICD-10-CM | POA: Diagnosis not present

## 2024-04-02 DIAGNOSIS — Z435 Encounter for attention to cystostomy: Secondary | ICD-10-CM | POA: Diagnosis not present

## 2024-04-02 DIAGNOSIS — L89313 Pressure ulcer of right buttock, stage 3: Secondary | ICD-10-CM | POA: Diagnosis not present

## 2024-04-02 DIAGNOSIS — Z466 Encounter for fitting and adjustment of urinary device: Secondary | ICD-10-CM | POA: Diagnosis not present

## 2024-04-02 DIAGNOSIS — I10 Essential (primary) hypertension: Secondary | ICD-10-CM | POA: Diagnosis not present

## 2024-04-02 DIAGNOSIS — Z794 Long term (current) use of insulin: Secondary | ICD-10-CM | POA: Diagnosis not present

## 2024-04-02 DIAGNOSIS — Z7984 Long term (current) use of oral hypoglycemic drugs: Secondary | ICD-10-CM | POA: Diagnosis not present

## 2024-04-02 DIAGNOSIS — H409 Unspecified glaucoma: Secondary | ICD-10-CM | POA: Diagnosis not present

## 2024-04-02 DIAGNOSIS — K219 Gastro-esophageal reflux disease without esophagitis: Secondary | ICD-10-CM | POA: Diagnosis not present

## 2024-04-02 DIAGNOSIS — Z7982 Long term (current) use of aspirin: Secondary | ICD-10-CM | POA: Diagnosis not present

## 2024-04-02 DIAGNOSIS — Z9181 History of falling: Secondary | ICD-10-CM | POA: Diagnosis not present

## 2024-04-02 DIAGNOSIS — D649 Anemia, unspecified: Secondary | ICD-10-CM | POA: Diagnosis not present

## 2024-04-02 DIAGNOSIS — E039 Hypothyroidism, unspecified: Secondary | ICD-10-CM | POA: Diagnosis not present

## 2024-04-02 DIAGNOSIS — E785 Hyperlipidemia, unspecified: Secondary | ICD-10-CM | POA: Diagnosis not present

## 2024-04-02 NOTE — Assessment & Plan Note (Signed)
 Increase Rexulti  back to 2 mg today. Can consider increasing further for a maximum of 4 mg daily if needed. Recommended to daughter that they keep their appointment with Dr. McDiarmid for geriatric clinic this Thursday 04/04/24.

## 2024-04-02 NOTE — Assessment & Plan Note (Signed)
 Healing well. Encouraged daughter who is tending to the wound, to continue with her current care.

## 2024-04-03 ENCOUNTER — Encounter (HOSPITAL_COMMUNITY): Payer: Self-pay | Admitting: Emergency Medicine

## 2024-04-03 ENCOUNTER — Other Ambulatory Visit: Payer: Self-pay

## 2024-04-03 ENCOUNTER — Emergency Department (HOSPITAL_COMMUNITY)

## 2024-04-03 ENCOUNTER — Inpatient Hospital Stay (HOSPITAL_COMMUNITY)
Admission: EM | Admit: 2024-04-03 | Discharge: 2024-04-07 | DRG: 637 | Disposition: A | Discharge status: Home Health | Attending: Family Medicine | Admitting: Family Medicine

## 2024-04-03 DIAGNOSIS — Z9359 Other cystostomy status: Secondary | ICD-10-CM | POA: Diagnosis not present

## 2024-04-03 DIAGNOSIS — L89153 Pressure ulcer of sacral region, stage 3: Secondary | ICD-10-CM | POA: Diagnosis present

## 2024-04-03 DIAGNOSIS — Z885 Allergy status to narcotic agent status: Secondary | ICD-10-CM

## 2024-04-03 DIAGNOSIS — D72819 Decreased white blood cell count, unspecified: Secondary | ICD-10-CM | POA: Diagnosis not present

## 2024-04-03 DIAGNOSIS — Z7984 Long term (current) use of oral hypoglycemic drugs: Secondary | ICD-10-CM | POA: Diagnosis not present

## 2024-04-03 DIAGNOSIS — Z86718 Personal history of other venous thrombosis and embolism: Secondary | ICD-10-CM

## 2024-04-03 DIAGNOSIS — R0989 Other specified symptoms and signs involving the circulatory and respiratory systems: Secondary | ICD-10-CM | POA: Diagnosis not present

## 2024-04-03 DIAGNOSIS — Z435 Encounter for attention to cystostomy: Secondary | ICD-10-CM | POA: Diagnosis not present

## 2024-04-03 DIAGNOSIS — E872 Acidosis, unspecified: Secondary | ICD-10-CM | POA: Diagnosis present

## 2024-04-03 DIAGNOSIS — D649 Anemia, unspecified: Secondary | ICD-10-CM | POA: Diagnosis present

## 2024-04-03 DIAGNOSIS — M159 Polyosteoarthritis, unspecified: Secondary | ICD-10-CM | POA: Diagnosis present

## 2024-04-03 DIAGNOSIS — Z9181 History of falling: Secondary | ICD-10-CM | POA: Diagnosis not present

## 2024-04-03 DIAGNOSIS — R680 Hypothermia, not associated with low environmental temperature: Secondary | ICD-10-CM | POA: Diagnosis present

## 2024-04-03 DIAGNOSIS — T68XXXA Hypothermia, initial encounter: Secondary | ICD-10-CM | POA: Diagnosis not present

## 2024-04-03 DIAGNOSIS — Z803 Family history of malignant neoplasm of breast: Secondary | ICD-10-CM

## 2024-04-03 DIAGNOSIS — Z794 Long term (current) use of insulin: Secondary | ICD-10-CM | POA: Diagnosis not present

## 2024-04-03 DIAGNOSIS — R6510 Systemic inflammatory response syndrome (SIRS) of non-infectious origin without acute organ dysfunction: Secondary | ICD-10-CM | POA: Diagnosis not present

## 2024-04-03 DIAGNOSIS — Z79890 Hormone replacement therapy (postmenopausal): Secondary | ICD-10-CM

## 2024-04-03 DIAGNOSIS — Z79899 Other long term (current) drug therapy: Secondary | ICD-10-CM | POA: Diagnosis not present

## 2024-04-03 DIAGNOSIS — E039 Hypothyroidism, unspecified: Secondary | ICD-10-CM | POA: Diagnosis not present

## 2024-04-03 DIAGNOSIS — I10 Essential (primary) hypertension: Secondary | ICD-10-CM | POA: Diagnosis present

## 2024-04-03 DIAGNOSIS — L89322 Pressure ulcer of left buttock, stage 2: Secondary | ICD-10-CM | POA: Diagnosis present

## 2024-04-03 DIAGNOSIS — Z789 Other specified health status: Secondary | ICD-10-CM

## 2024-04-03 DIAGNOSIS — Z833 Family history of diabetes mellitus: Secondary | ICD-10-CM

## 2024-04-03 DIAGNOSIS — Z466 Encounter for fitting and adjustment of urinary device: Secondary | ICD-10-CM | POA: Diagnosis not present

## 2024-04-03 DIAGNOSIS — L89313 Pressure ulcer of right buttock, stage 3: Secondary | ICD-10-CM | POA: Diagnosis not present

## 2024-04-03 DIAGNOSIS — T383X5A Adverse effect of insulin and oral hypoglycemic [antidiabetic] drugs, initial encounter: Secondary | ICD-10-CM | POA: Diagnosis present

## 2024-04-03 DIAGNOSIS — Z66 Do not resuscitate: Secondary | ICD-10-CM | POA: Diagnosis not present

## 2024-04-03 DIAGNOSIS — E785 Hyperlipidemia, unspecified: Secondary | ICD-10-CM | POA: Diagnosis present

## 2024-04-03 DIAGNOSIS — E1140 Type 2 diabetes mellitus with diabetic neuropathy, unspecified: Secondary | ICD-10-CM | POA: Diagnosis present

## 2024-04-03 DIAGNOSIS — E440 Moderate protein-calorie malnutrition: Secondary | ICD-10-CM | POA: Diagnosis not present

## 2024-04-03 DIAGNOSIS — Z800 Family history of malignant neoplasm of digestive organs: Secondary | ICD-10-CM

## 2024-04-03 DIAGNOSIS — G9340 Encephalopathy, unspecified: Secondary | ICD-10-CM | POA: Diagnosis not present

## 2024-04-03 DIAGNOSIS — N3281 Overactive bladder: Secondary | ICD-10-CM | POA: Diagnosis present

## 2024-04-03 DIAGNOSIS — E162 Hypoglycemia, unspecified: Principal | ICD-10-CM | POA: Diagnosis present

## 2024-04-03 DIAGNOSIS — H409 Unspecified glaucoma: Secondary | ICD-10-CM | POA: Diagnosis not present

## 2024-04-03 DIAGNOSIS — I517 Cardiomegaly: Secondary | ICD-10-CM | POA: Diagnosis not present

## 2024-04-03 DIAGNOSIS — F39 Unspecified mood [affective] disorder: Secondary | ICD-10-CM | POA: Diagnosis present

## 2024-04-03 DIAGNOSIS — R627 Adult failure to thrive: Secondary | ICD-10-CM | POA: Diagnosis not present

## 2024-04-03 DIAGNOSIS — Z7982 Long term (current) use of aspirin: Secondary | ICD-10-CM | POA: Diagnosis not present

## 2024-04-03 DIAGNOSIS — F03918 Unspecified dementia, unspecified severity, with other behavioral disturbance: Secondary | ICD-10-CM | POA: Insufficient documentation

## 2024-04-03 DIAGNOSIS — E11649 Type 2 diabetes mellitus with hypoglycemia without coma: Secondary | ICD-10-CM | POA: Diagnosis not present

## 2024-04-03 DIAGNOSIS — F32A Depression, unspecified: Secondary | ICD-10-CM | POA: Diagnosis present

## 2024-04-03 DIAGNOSIS — K219 Gastro-esophageal reflux disease without esophagitis: Secondary | ICD-10-CM | POA: Diagnosis not present

## 2024-04-03 DIAGNOSIS — R404 Transient alteration of awareness: Secondary | ICD-10-CM | POA: Diagnosis not present

## 2024-04-03 DIAGNOSIS — Z90710 Acquired absence of both cervix and uterus: Secondary | ICD-10-CM

## 2024-04-03 DIAGNOSIS — Z87440 Personal history of urinary (tract) infections: Secondary | ICD-10-CM

## 2024-04-03 DIAGNOSIS — I959 Hypotension, unspecified: Secondary | ICD-10-CM | POA: Diagnosis present

## 2024-04-03 DIAGNOSIS — Z87891 Personal history of nicotine dependence: Secondary | ICD-10-CM

## 2024-04-03 DIAGNOSIS — L8990 Pressure ulcer of unspecified site, unspecified stage: Secondary | ICD-10-CM | POA: Insufficient documentation

## 2024-04-03 DIAGNOSIS — E119 Type 2 diabetes mellitus without complications: Secondary | ICD-10-CM | POA: Diagnosis not present

## 2024-04-03 DIAGNOSIS — J9811 Atelectasis: Secondary | ICD-10-CM | POA: Diagnosis not present

## 2024-04-03 DIAGNOSIS — Z743 Need for continuous supervision: Secondary | ICD-10-CM | POA: Diagnosis not present

## 2024-04-03 DIAGNOSIS — Z634 Disappearance and death of family member: Secondary | ICD-10-CM

## 2024-04-03 DIAGNOSIS — M8580 Other specified disorders of bone density and structure, unspecified site: Secondary | ICD-10-CM | POA: Diagnosis present

## 2024-04-03 DIAGNOSIS — Z888 Allergy status to other drugs, medicaments and biological substances status: Secondary | ICD-10-CM

## 2024-04-03 DIAGNOSIS — F03C3 Unspecified dementia, severe, with mood disturbance: Secondary | ICD-10-CM | POA: Diagnosis present

## 2024-04-03 LAB — RESP PANEL BY RT-PCR (RSV, FLU A&B, COVID)  RVPGX2
Influenza A by PCR: NEGATIVE
Influenza B by PCR: NEGATIVE
Resp Syncytial Virus by PCR: NEGATIVE
SARS Coronavirus 2 by RT PCR: NEGATIVE

## 2024-04-03 LAB — COMPREHENSIVE METABOLIC PANEL WITH GFR
ALT: 10 U/L (ref 0–44)
AST: 22 U/L (ref 15–41)
Albumin: 3 g/dL — ABNORMAL LOW (ref 3.5–5.0)
Alkaline Phosphatase: 39 U/L (ref 38–126)
Anion gap: 11 (ref 5–15)
BUN: 9 mg/dL (ref 8–23)
CO2: 25 mmol/L (ref 22–32)
Calcium: 8.5 mg/dL — ABNORMAL LOW (ref 8.9–10.3)
Chloride: 100 mmol/L (ref 98–111)
Creatinine, Ser: 0.62 mg/dL (ref 0.44–1.00)
GFR, Estimated: >60 mL/min (ref >60)
Glucose, Bld: 113 mg/dL — ABNORMAL HIGH (ref 70–99)
Potassium: 3.7 mmol/L (ref 3.5–5.1)
Sodium: 136 mmol/L (ref 135–145)
Total Bilirubin: 0.4 mg/dL (ref 0.0–1.2)
Total Protein: 5.4 g/dL — ABNORMAL LOW (ref 6.5–8.1)

## 2024-04-03 LAB — CBG MONITORING, ED
Glucose-Capillary: 107 mg/dL — ABNORMAL HIGH (ref 70–99)
Glucose-Capillary: 83 mg/dL (ref 70–99)
Glucose-Capillary: 87 mg/dL (ref 70–99)

## 2024-04-03 LAB — PROTIME-INR
INR: 1.1 (ref 0.8–1.2)
Prothrombin Time: 14.4 s (ref 11.4–15.2)

## 2024-04-03 LAB — I-STAT CG4 LACTIC ACID, ED: Lactic Acid, Venous: 3.5 mmol/L (ref 0.5–1.9)

## 2024-04-03 MED ORDER — LACTATED RINGERS IV BOLUS
1000.0000 mL | Freq: Once | INTRAVENOUS | Status: AC
  Administered 2024-04-03 (×2): 1000 mL via INTRAVENOUS

## 2024-04-03 MED ORDER — ATORVASTATIN CALCIUM 40 MG PO TABS: 40.0000 mg | ORAL_TABLET | Freq: Every day | ORAL | 3 refills | Status: DC

## 2024-04-03 NOTE — ED Provider Notes (Signed)
 Care assumed at 2330.  Patient with history of dementia here for evaluation of altered mental status and hypoglycemia.  She did receive D10 by EMS.  Blood sugars have downtrended during her ED stay.  She is now taking p.o.  Care assumed pending CBC.  CBC with leukopenia, anemia.  Lactic acid is elevated, she was started on antibiotics but there is no definite evidence of sepsis at this time.  Given her hypoglycemia, elevated lactate medicine consulted for admission for ongoing care.   Griselda Norris, MD 04/04/24 [phone]

## 2024-04-03 NOTE — ED Notes (Signed)
 Pt given snack and beverage per RN,

## 2024-04-03 NOTE — ED Provider Notes (Signed)
 [place] EMERGENCY DEPARTMENT AT [facility] Provider Note  CSN: [id number] Arrival date & time: 04/03/24 2100  Chief Complaint(s) Hypoglycemia  HPI Hayley Fisher is a 89 y.o. female with past medical history as below, significant for dementia, HLD, HTN,  who presents to the ED with complaint of hypoglycemia.  Pt here with family, she is poor historian 2/2 dementia. Family provides history. Pt recently admitted with AMS, sepsis likely 2/2 UTI. She completed cefepime  / zosyn  and then ciprofloxacin  > fosfomycin. She was discharged 7/31 with improved symptoms. Pt returns today with family 2/2 hypoglycemia, family reports pt with poor po intake over the last few days, eating only 1 meal a day from her typical 3. Her glucose has been running low, prior to arrival family went to check on patient at mealtime and she was minimally responsive in her bed and soaked in sweat. On EMS arrival her glucose was <50, she was given oral glucose and then D50 with improvement in her glucose and mentation. On arrival pt is hypothermic. Family is concerned for another urinary tract infection. She has chronic wound to her sacrum that seems to be improving per family at bedside. Pt has no complaints other than she is hungry. Is at baseline currently per family.   Past Medical History Past Medical History:  Diagnosis Date   Arthritis    Cataract    bil cateracts removed   Chronic kidney disease    spot on one of my kidneys per pt   Diabetes mellitus    Diarrhea 02/20/2022   Gastroesophageal reflux disease without esophagitis 09/16/2020   Glaucoma    Goals of care, counseling/discussion 09/09/2021   Hyperkalemia 07/02/2020   Hyperlipidemia    Hyperplastic colon polyp    Hypertension    Hypothyroidism 12/09/2020   Leukocytopenia 06/27/2021   Metabolic acidosis 07/02/2020   S/P IVC filter 01/16/2021   DVT 12/2020   Syncope    Thyroid  disease    Urge incontinence 05/05/2022   Patient Active  Problem List   Diagnosis Date Noted   Behavioral and psychological symptoms of dementia (HCC) 04/03/2024   Acute cystitis without hematuria 03/21/2024   Somnolence 03/19/2024   Anemia 03/17/2024   Sepsis without acute organ dysfunction (HCC) 03/15/2024   Chronic health problem 03/15/2024   Sacral decubitus ulcer 03/15/2024   Acute hypoxemic respiratory failure (HCC) 03/16/2023   Bilateral sensorineural hearing loss 09/07/2022   Weight loss, unintentional 05/06/2022   Diabetic neuropathy (HCC) 05/05/2022   Chronic suprapubic catheter (HCC)    Polypharmacy 03/02/2022   Type 2 diabetes mellitus with hyperglycemia, with long-term current use of insulin  (HCC) 02/11/2022   History of DVT (deep vein thrombosis) 11/04/2021   Mood disorder (HCC) 09/09/2021   Constipation 08/31/2021   Insomnia 08/31/2021   Malnutrition of moderate degree 02/23/2021   Dementia (HCC) 02/21/2021   Hypothyroidism 12/09/2020   Unstable gait 04/03/2018   Frequent falls 04/03/2018   Skin ulcer of sacrum, limited to breakdown of skin (HCC) 03/16/2017   Spinal stenosis of lumbar region 01/13/2016   Chronic venous insufficiency 11/09/2015   DDD (degenerative disc disease), lumbosacral 11/24/2014   Frequent UTI 10/29/2012   Onychomycosis 04/03/2009   Hyperlipidemia associated with type 2 diabetes mellitus (HCC) 10/19/2006   Glaucoma 10/19/2006   Hypertension associated with diabetes (HCC) 10/19/2006   Osteoarthritis, multiple sites 10/19/2006   Osteopenia 10/19/2006   Home Medication(s) Prior to Admission medications   Medication Sig Start Date End Date Taking? Authorizing Provider  ACCU-CHEK AVIVA  PLUS test strip USE TO TEST BLOOD SUGAR UP TO 3 TIMES A DAY. 03/06/24   Shamleffer, Ibtehal Jaralla, MD  acetaminophen  (TYLENOL ) 500 MG tablet Take 2 tablets (1,000 mg total) by mouth every 8 (eight) hours as needed for moderate pain (pain score 4-6). 03/21/24   Shitarev, Dimitry, MD  amLODipine  (NORVASC ) 5 MG tablet  Take 1 tablet (5 mg total) by mouth daily. 02/28/24   Baker, Raguel MATSU, DO  ascorbic acid  (VITAMIN C ) 500 MG tablet Take 1 tablet (500 mg total) by mouth 2 (two) times daily. 03/21/24   Shitarev, Dimitry, MD  aspirin  81 MG chewable tablet Chew 81 mg by mouth daily.    [provider]  atorvastatin  (LIPITOR) 40 MG tablet Take 1 tablet (40 mg total) by mouth daily. 04/03/24   Baker, Raguel MATSU, DO  brexpiprazole  (REXULTI ) 2 MG TABS tablet Take 1 tablet (2 mg total) by mouth daily. 04/01/24   Baker, Raguel MATSU, DO  brimonidine  (ALPHAGAN ) 0.2 % ophthalmic solution Place 1 drop into the left eye 2 (two) times daily. 03/30/21   [provider]  Control Gel Formula Dressing (DUODERM CGF EXTRA THIN) MISC Apply 1 each topically every three (3) days as needed (Change every 3-5 days, or sooner if soiled.). 03/06/24   Lafe Domino, DO  diclofenac  Sodium (VOLTAREN ) 1 % GEL Apply 2 g topically 4 (four) times daily. Apply over the affected area.  Please avoid the genital areas. 10/03/23   Rosendo Rush, MD  dorzolamide -timolol  (COSOPT ) 22.3-6.8 MG/ML ophthalmic solution Place 1 drop into both eyes 2 (two) times daily. 05/18/20   [provider]  famotidine  (PEPCID ) 10 MG tablet Take 1 tablet (10 mg total) by mouth daily. 03/22/24   Shitarev, Dimitry, MD  feeding supplement, GLUCERNA SHAKE, (GLUCERNA SHAKE) LIQD Take 237 mLs by mouth 3 (three) times daily between meals. Patient not taking: Reported on 04/01/2024 03/21/24   Shitarev, Dimitry, MD  FEROSUL 325 (65 Fe) MG tablet TAKE 1 TABLET BY MOUTH DAILY WITH BREAKFAST 10/11/23   Christia Budds, MD  insulin  glargine (LANTUS  SOLOSTAR) 100 UNIT/ML Solostar Pen Inject 6 Units into the skin daily. 02/08/24   Christia Budds, MD  Insulin  Pen Needle 32G X 4 MM MISC 1 Device by Does not apply route daily in the afternoon. 10/31/23   Shamleffer, Ibtehal Jaralla, MD  levothyroxine  (SYNTHROID ) 50 MCG tablet Take 1 tablet (50 mcg total) by mouth daily. 11/01/23    Shamleffer, Ibtehal Jaralla, MD  lidocaine -prilocaine  (EMLA ) cream Apply 1 Application topically as needed (Apply to ulcer as needed for pain.). 03/06/24 04/05/24  Lafe Domino, DO  metFORMIN  (GLUCOPHAGE -XR) 500 MG 24 hr tablet Take 2 tablets (1,000 mg total) by mouth daily with breakfast. 10/31/23   Shamleffer, Ibtehal Jaralla, MD  metoprolol  succinate (TOPROL -XL) 25 MG 24 hr tablet Take 1 tablet (25 mg total) by mouth daily. 03/07/24   Baker, Amelia G, DO  Multiple Vitamin (MULTIVITAMIN WITH MINERALS) TABS tablet Take 1 tablet by mouth daily. Patient not taking: Reported on 04/01/2024 03/21/24   Shitarev, Dimitry, MD  Nutritional Supplements (,FEEDING SUPPLEMENT, PROSOURCE PLUS) liquid Take 30 mLs by mouth 2 (two) times daily between meals. Patient not taking: Reported on 04/01/2024 03/21/24   Shitarev, Dimitry, MD  polyethylene glycol powder (GLYCOLAX /MIRALAX ) 17 GM/SCOOP powder Take 17 g by mouth in the morning and at bedtime. 08/20/21   Christia Budds, MD  psyllium (METAMUCIL) 58.6 % packet Take 1 packet by mouth daily. Patient not taking: Reported on 04/01/2024 10/31/23  Jagadish, Mayuri, MD  SARNA lotion Apply 1 application  topically 4 (four) times daily as needed for itching (affected areas). Patient not taking: Reported on 04/01/2024 05/27/21   [provider]  senna (SENOKOT) 8.6 MG TABS tablet Take 1 tablet (8.6 mg total) by mouth 2 (two) times daily. 08/09/21   Marlee Lynwood NOVAK, MD  tamsulosin  (FLOMAX ) 0.4 MG CAPS capsule Take 1 capsule (0.4 mg total) by mouth daily. 02/28/24   Baker, Raguel MATSU, DO  VYZULTA  0.024 % SOLN Place 1 drop into both eyes at bedtime. Patient not taking: Reported on 04/01/2024    [provider]  Zinc  Sulfate 220 (50 Zn) MG TABS Take 1 tablet (220 mg total) by mouth daily. Patient not taking: Reported on 04/01/2024 03/21/24   Toma Matas, MD                                                                                                                                     Past Surgical History Past Surgical History:  Procedure Laterality Date   ABDOMINAL HYSTERECTOMY     BLADDER SUSPENSION     bladder tacking     COLONOSCOPY     EYE SURGERY     INGUINAL HERNIA REPAIR Right    IR CATHETER TUBE CHANGE  09/15/2022   IR IVC FILTER PLMT / S&I /IMG GUID/MOD SED  01/16/2021   IR US  GUIDE BX ASP/DRAIN  08/02/2022   Family History Family History  Problem Relation Age of Onset   Colon cancer Mother    Other Father        cerebral hemorrhage   Diabetes Brother    Diabetes Sister        x 2   Bone cancer Daughter    Breast cancer Daughter 67   Esophageal cancer Neg Hx    Rectal cancer Neg Hx    Stomach cancer Neg Hx     Social History Social History   Tobacco Use   Smoking status: Former    Current packs/day: 0.00    Types: Cigarettes    Quit date: 11/29/1980    Years since quitting: 43.3    Passive exposure: Past   Smokeless tobacco: Never  Vaping Use   Vaping status: Never Used  Substance Use Topics   Alcohol  use: No    Alcohol /week: 0.0 standard drinks of alcohol    Drug use: No   Allergies Levemir  [insulin  detemir] and Tramadol   Review of Systems A thorough review of systems was obtained and all systems are negative except as noted in the HPI and PMH.   Physical Exam Vital Signs  I have reviewed the triage vital signs BP (!) 122/95   Pulse 74   Temp (!) 97.3 F (36.3 C) (Oral)   Resp (!) 22   SpO2 100%  Physical Exam Vitals and nursing note reviewed.  Constitutional:      General: She is not in  acute distress.    Appearance: Normal appearance. She is well-developed. She is not ill-appearing.  HENT:     Head: Normocephalic and atraumatic.     Right Ear: External ear normal.     Left Ear: External ear normal.     Nose: Nose normal.     Mouth/Throat:     Mouth: Mucous membranes are moist.  Eyes:     General: No scleral icterus.       Right eye: No discharge.        Left eye: No discharge.   Cardiovascular:     Rate and Rhythm: Normal rate.  Pulmonary:     Effort: Pulmonary effort is normal. Tachypnea present. No respiratory distress.     Breath sounds: No stridor.  Abdominal:     General: Abdomen is flat. There is no distension.     Palpations: Abdomen is soft.     Tenderness: There is no abdominal tenderness. There is no guarding.     Comments: Suprapubic catheter  Musculoskeletal:        General: No deformity.     Cervical back: No rigidity.  Skin:    General: Skin is cool and dry.     Coloration: Skin is not cyanotic, jaundiced or pale.  Neurological:     Mental Status: She is alert and oriented to person, place, and time.     GCS: GCS eye subscore is 4. GCS verbal subscore is 5. GCS motor subscore is 6.  Psychiatric:        Speech: Speech normal.        Behavior: Behavior normal. Behavior is cooperative.     ED Results and Treatments Labs (all labs ordered are listed, but only abnormal results are displayed) Labs Reviewed  COMPREHENSIVE METABOLIC PANEL WITH GFR - Abnormal; Notable for the following components:      Result Value   Glucose, Bld 113 (*)    Calcium  8.5 (*)    Total Protein 5.4 (*)    Albumin 3.0 (*)    All other components within normal limits  CBG MONITORING, ED - Abnormal; Notable for the following components:   Glucose-Capillary 107 (*)    All other components within normal limits  I-STAT CG4 LACTIC ACID, ED - Abnormal; Notable for the following components:   Lactic Acid, Venous 3.5 (*)    All other components within normal limits  RESP PANEL BY RT-PCR (RSV, FLU A&B, COVID)  RVPGX2  CULTURE, BLOOD (ROUTINE X 2)  CULTURE, BLOOD (ROUTINE X 2)  PROTIME-INR  CBC  URINALYSIS, W/ REFLEX TO CULTURE (INFECTION SUSPECTED)  CBC WITH DIFFERENTIAL/PLATELET  CBG MONITORING, ED  CBG MONITORING, ED  CBG MONITORING, ED  CBG MONITORING, ED  I-STAT CG4 LACTIC ACID, ED  CBG MONITORING, ED                                                                                                                           Radiology DG Chest  Port 1 View Result Date: 04/03/2024 CLINICAL DATA:  Questionable sepsis - evaluate for abnormality EXAM: PORTABLE CHEST - 1 VIEW COMPARISON:  March 15, 2024 FINDINGS: Low lung volumes with bronchovascular crowding. Subsegmental atelectasis in both lung bases. No pleural effusion or pneumothorax. Mild cardiomegaly. No acute fracture or destructive lesions. Multilevel thoracic osteophytosis. Osteopenia. IMPRESSION: Low lung volumes with streaky bibasilar atelectasis. Electronically Signed   By: Rogelia Myers M.D.   On: 04/03/2024 22:18    Pertinent labs & imaging results that were available during my care of the patient were reviewed by me and considered in my medical decision making (see MDM for details).  Medications Ordered in ED Medications  lactated ringers  bolus 1,000 mL (0 mLs Intravenous Stopped 04/03/24 2312)                                                                                                                                     Procedures Procedures  (including critical care time)  Medical Decision Making / ED Course    Medical Decision Making:    Shatiqua Heroux is a 89 y.o. female with past medical history as below, significant for dementia, HLD, HTN,  who presents to the ED with complaint of hypoglycemia.. The complaint involves an extensive differential diagnosis and also carries with it a high risk of complications and morbidity.  Serious etiology was considered. Ddx includes but is not limited to: Differential diagnoses for altered mental status includes but is not exclusive to alcohol , illicit or prescription medications, intracranial pathology such as stroke, intracerebral hemorrhage, fever or infectious causes including sepsis, hypoxemia, uremia, trauma, endocrine related disorders such as diabetes, hypoglycemia, thyroid -related diseases, etc.   Complete initial physical exam  performed, notably the patient was in no acute distress, she is hypothermic.    Reviewed and confirmed nursing documentation for past medical history, family history, social history.  Vital signs reviewed.    Hypoglycemia  Hypothermia >  - IDDM w/ lantus  and metformin , poor po intake last few days  - glucose 50's prior to arrival, did improve with oral/IV dextrose  - glucose down-trending while in the ED, she was given PO, serial CBG - pt w/ hypothermia, she has indwelling suprapubic catheter and recent UTI. No clear source of infection identified at this time.   Handoff to incoming EDP pending remainder of labs and eventual admission.                      Additional history obtained: -Additional history obtained from family -External records from outside source obtained and reviewed including: Chart review including previous notes, labs, imaging, consultation notes including  Recent admission, prior labs/imaging    Lab Tests: -I ordered, reviewed, and interpreted labs.   The pertinent results include:   Labs Reviewed  COMPREHENSIVE METABOLIC PANEL WITH GFR - Abnormal; Notable for the following components:      Result Value   Glucose,  Bld 113 (*)    Calcium  8.5 (*)    Total Protein 5.4 (*)    Albumin 3.0 (*)    All other components within normal limits  CBG MONITORING, ED - Abnormal; Notable for the following components:   Glucose-Capillary 107 (*)    All other components within normal limits  I-STAT CG4 LACTIC ACID, ED - Abnormal; Notable for the following components:   Lactic Acid, Venous 3.5 (*)    All other components within normal limits  RESP PANEL BY RT-PCR (RSV, FLU A&B, COVID)  RVPGX2  CULTURE, BLOOD (ROUTINE X 2)  CULTURE, BLOOD (ROUTINE X 2)  PROTIME-INR  CBC  URINALYSIS, W/ REFLEX TO CULTURE (INFECTION SUSPECTED)  CBC WITH DIFFERENTIAL/PLATELET  CBG MONITORING, ED  CBG MONITORING, ED  CBG MONITORING, ED  CBG MONITORING, ED  I-STAT CG4 LACTIC  ACID, ED  CBG MONITORING, ED    Notable for lactic acidosis, labs o/w pending   EKG   EKG Interpretation Date/Time:    Ventricular Rate:    PR Interval:    QRS Duration:    QT Interval:    QTC Calculation:   R Axis:      Text Interpretation:           Imaging Studies ordered: I ordered imaging studies including cxr I independently visualized the following imaging with scope of interpretation limited to determining acute life threatening conditions related to emergency care; findings noted above I agree with the radiologist interpretation If any imaging was obtained with contrast I closely monitored patient for any possible adverse reaction a/w contrast administration in the emergency department   Medicines ordered and prescription drug management: Meds ordered this encounter  Medications   lactated ringers  bolus 1,000 mL    -I have reviewed the patients home medicines and have made adjustments as needed   Consultations Obtained: na   Cardiac Monitoring: The patient was maintained on a cardiac monitor.  I personally viewed and interpreted the cardiac monitored which showed an underlying rhythm of: nsr Continuous pulse oximetry interpreted by myself, 100% on ra.    Social Determinants of Health:  Diagnosis or treatment significantly limited by social determinants of health: former smoker   Reevaluation: After the interventions noted above, I reevaluated the patient and found that they have improved  Co morbidities that complicate the patient evaluation  Past Medical History:  Diagnosis Date   Arthritis    Cataract    bil cateracts removed   Chronic kidney disease    spot on one of my kidneys per pt   Diabetes mellitus    Diarrhea 02/20/2022   Gastroesophageal reflux disease without esophagitis 09/16/2020   Glaucoma    Goals of care, counseling/discussion 09/09/2021   Hyperkalemia 07/02/2020   Hyperlipidemia    Hyperplastic colon polyp     Hypertension    Hypothyroidism 12/09/2020   Leukocytopenia 06/27/2021   Metabolic acidosis 07/02/2020   S/P IVC filter 01/16/2021   DVT 12/2020   Syncope    Thyroid  disease    Urge incontinence 05/05/2022      Dispostion: Disposition decision including need for hospitalization was considered, and patient disposition pending at time of sign out.    Final Clinical Impression(s) / ED Diagnoses Final diagnoses:  Hypoglycemia  Hypothermia, initial encounter        Elnor Jayson LABOR, DO 04/04/24 0004

## 2024-04-03 NOTE — ED Triage Notes (Signed)
 Pt bib ems for hypoglycemia. Pt was intially 36 on scene. Now 236. Pt has history of dementia

## 2024-04-03 NOTE — ED Triage Notes (Signed)
 BIB EMS from home.  Hx of dementia.  EMS called out for hypoglycemia.  Fire found pt with CBG of 36, given oral glucose and D10.  Repeat sugar with EMS was 230.  Pt has known sacral decubitus ulcer and that is her only complaint at this time. Recently treated for UTI at this facility.

## 2024-04-04 ENCOUNTER — Ambulatory Visit

## 2024-04-04 DIAGNOSIS — R627 Adult failure to thrive: Secondary | ICD-10-CM | POA: Diagnosis present

## 2024-04-04 DIAGNOSIS — E440 Moderate protein-calorie malnutrition: Secondary | ICD-10-CM | POA: Diagnosis present

## 2024-04-04 DIAGNOSIS — I10 Essential (primary) hypertension: Secondary | ICD-10-CM | POA: Diagnosis present

## 2024-04-04 DIAGNOSIS — R6510 Systemic inflammatory response syndrome (SIRS) of non-infectious origin without acute organ dysfunction: Secondary | ICD-10-CM | POA: Diagnosis present

## 2024-04-04 DIAGNOSIS — E039 Hypothyroidism, unspecified: Secondary | ICD-10-CM | POA: Diagnosis present

## 2024-04-04 DIAGNOSIS — L89313 Pressure ulcer of right buttock, stage 3: Secondary | ICD-10-CM | POA: Diagnosis present

## 2024-04-04 DIAGNOSIS — F39 Unspecified mood [affective] disorder: Secondary | ICD-10-CM | POA: Diagnosis present

## 2024-04-04 DIAGNOSIS — F03918 Unspecified dementia, unspecified severity, with other behavioral disturbance: Secondary | ICD-10-CM

## 2024-04-04 DIAGNOSIS — E1140 Type 2 diabetes mellitus with diabetic neuropathy, unspecified: Secondary | ICD-10-CM | POA: Diagnosis present

## 2024-04-04 DIAGNOSIS — Z9359 Other cystostomy status: Secondary | ICD-10-CM | POA: Diagnosis not present

## 2024-04-04 DIAGNOSIS — H409 Unspecified glaucoma: Secondary | ICD-10-CM | POA: Diagnosis present

## 2024-04-04 DIAGNOSIS — E785 Hyperlipidemia, unspecified: Secondary | ICD-10-CM | POA: Diagnosis present

## 2024-04-04 DIAGNOSIS — Z66 Do not resuscitate: Secondary | ICD-10-CM | POA: Diagnosis present

## 2024-04-04 DIAGNOSIS — Z7401 Bed confinement status: Secondary | ICD-10-CM | POA: Diagnosis not present

## 2024-04-04 DIAGNOSIS — F03C3 Unspecified dementia, severe, with mood disturbance: Secondary | ICD-10-CM | POA: Diagnosis present

## 2024-04-04 DIAGNOSIS — Z743 Need for continuous supervision: Secondary | ICD-10-CM | POA: Diagnosis not present

## 2024-04-04 DIAGNOSIS — Z79899 Other long term (current) drug therapy: Secondary | ICD-10-CM | POA: Diagnosis not present

## 2024-04-04 DIAGNOSIS — E11649 Type 2 diabetes mellitus with hypoglycemia without coma: Secondary | ICD-10-CM | POA: Insufficient documentation

## 2024-04-04 DIAGNOSIS — F32A Depression, unspecified: Secondary | ICD-10-CM | POA: Diagnosis present

## 2024-04-04 DIAGNOSIS — Z79890 Hormone replacement therapy (postmenopausal): Secondary | ICD-10-CM | POA: Diagnosis not present

## 2024-04-04 DIAGNOSIS — D649 Anemia, unspecified: Secondary | ICD-10-CM | POA: Diagnosis present

## 2024-04-04 DIAGNOSIS — E872 Acidosis, unspecified: Secondary | ICD-10-CM | POA: Diagnosis present

## 2024-04-04 DIAGNOSIS — Z794 Long term (current) use of insulin: Secondary | ICD-10-CM | POA: Diagnosis not present

## 2024-04-04 DIAGNOSIS — L89322 Pressure ulcer of left buttock, stage 2: Secondary | ICD-10-CM | POA: Diagnosis present

## 2024-04-04 DIAGNOSIS — D72819 Decreased white blood cell count, unspecified: Secondary | ICD-10-CM | POA: Diagnosis present

## 2024-04-04 DIAGNOSIS — Z435 Encounter for attention to cystostomy: Secondary | ICD-10-CM | POA: Diagnosis not present

## 2024-04-04 DIAGNOSIS — R404 Transient alteration of awareness: Secondary | ICD-10-CM | POA: Diagnosis not present

## 2024-04-04 DIAGNOSIS — Z7984 Long term (current) use of oral hypoglycemic drugs: Secondary | ICD-10-CM | POA: Diagnosis not present

## 2024-04-04 DIAGNOSIS — L89312 Pressure ulcer of right buttock, stage 2: Secondary | ICD-10-CM | POA: Diagnosis not present

## 2024-04-04 DIAGNOSIS — L89153 Pressure ulcer of sacral region, stage 3: Secondary | ICD-10-CM | POA: Diagnosis present

## 2024-04-04 DIAGNOSIS — E162 Hypoglycemia, unspecified: Secondary | ICD-10-CM | POA: Diagnosis not present

## 2024-04-04 LAB — CBG MONITORING, ED
Glucose-Capillary: 106 mg/dL — ABNORMAL HIGH (ref 70–99)
Glucose-Capillary: 131 mg/dL — ABNORMAL HIGH (ref 70–99)
Glucose-Capillary: 148 mg/dL — ABNORMAL HIGH (ref 70–99)
Glucose-Capillary: 66 mg/dL — ABNORMAL LOW (ref 70–99)
Glucose-Capillary: 76 mg/dL (ref 70–99)
Glucose-Capillary: 82 mg/dL (ref 70–99)
Glucose-Capillary: 93 mg/dL (ref 70–99)

## 2024-04-04 LAB — COMPREHENSIVE METABOLIC PANEL WITH GFR
ALT: 11 U/L (ref 0–44)
AST: 23 U/L (ref 15–41)
Albumin: 2.9 g/dL — ABNORMAL LOW (ref 3.5–5.0)
Alkaline Phosphatase: 41 U/L (ref 38–126)
Anion gap: 8 (ref 5–15)
BUN: 8 mg/dL (ref 8–23)
CO2: 29 mmol/L (ref 22–32)
Calcium: 8.5 mg/dL — ABNORMAL LOW (ref 8.9–10.3)
Chloride: 102 mmol/L (ref 98–111)
Creatinine, Ser: 0.53 mg/dL (ref 0.44–1.00)
GFR, Estimated: >60 mL/min (ref >60)
Glucose, Bld: 94 mg/dL (ref 70–99)
Potassium: 3.7 mmol/L (ref 3.5–5.1)
Sodium: 139 mmol/L (ref 135–145)
Total Bilirubin: 0.6 mg/dL (ref 0.0–1.2)
Total Protein: 5.4 g/dL — ABNORMAL LOW (ref 6.5–8.1)

## 2024-04-04 LAB — IRON AND TIBC
Iron: 46 ug/dL (ref 28–170)
Saturation Ratios: 19 % (ref 10.4–31.8)
TIBC: 245 ug/dL — ABNORMAL LOW (ref 250–450)
UIBC: 199 ug/dL

## 2024-04-04 LAB — CBC
HCT: 30.3 % — ABNORMAL LOW (ref 36.0–46.0)
Hemoglobin: 9.6 g/dL — ABNORMAL LOW (ref 12.0–15.0)
MCH: 31.4 pg (ref 26.0–34.0)
MCHC: 31.7 g/dL (ref 30.0–36.0)
MCV: 99 fL (ref 80.0–100.0)
Platelets: 209 K/uL (ref 150–400)
RBC: 3.06 MIL/uL — ABNORMAL LOW (ref 3.87–5.11)
RDW: 14.6 % (ref 11.5–15.5)
WBC: 5.5 K/uL (ref 4.0–10.5)
nRBC: 0 % (ref 0.0–0.2)

## 2024-04-04 LAB — GLUCOSE, CAPILLARY
Glucose-Capillary: 157 mg/dL — ABNORMAL HIGH (ref 70–99)
Glucose-Capillary: 179 mg/dL — ABNORMAL HIGH (ref 70–99)
Glucose-Capillary: 156 mg/dL — ABNORMAL HIGH (ref 70–99)
Glucose-Capillary: 240 mg/dL — ABNORMAL HIGH (ref 70–99)
Glucose-Capillary: 272 mg/dL — ABNORMAL HIGH (ref 70–99)

## 2024-04-04 LAB — URINALYSIS, W/ REFLEX TO CULTURE (INFECTION SUSPECTED)
Bilirubin Urine: NEGATIVE
Glucose, UA: NEGATIVE mg/dL
Ketones, ur: NEGATIVE mg/dL
Nitrite: NEGATIVE
Protein, ur: NEGATIVE mg/dL
Specific Gravity, Urine: 1.003 — ABNORMAL LOW (ref 1.005–1.030)
pH: 6 (ref 5.0–8.0)

## 2024-04-04 LAB — CBC WITH DIFFERENTIAL/PLATELET
Abs Immature Granulocytes: 0.02 K/uL (ref 0.00–0.07)
Basophils Absolute: 0 K/uL (ref 0.0–0.1)
Basophils Relative: 0 %
Eosinophils Absolute: 0 K/uL (ref 0.0–0.5)
Eosinophils Relative: 1 %
HCT: 26.4 % — ABNORMAL LOW (ref 36.0–46.0)
Hemoglobin: 8.4 g/dL — ABNORMAL LOW (ref 12.0–15.0)
Immature Granulocytes: 1 %
Lymphocytes Relative: 12 %
Lymphs Abs: 0.3 K/uL — ABNORMAL LOW (ref 0.7–4.0)
MCH: 31.8 pg (ref 26.0–34.0)
MCHC: 31.8 g/dL (ref 30.0–36.0)
MCV: 100 fL (ref 80.0–100.0)
Monocytes Absolute: 0.2 K/uL (ref 0.1–1.0)
Monocytes Relative: 9 %
Neutro Abs: 2 K/uL (ref 1.7–7.7)
Neutrophils Relative %: 77 %
Platelets: 161 K/uL (ref 150–400)
RBC: 2.64 MIL/uL — ABNORMAL LOW (ref 3.87–5.11)
RDW: 14.4 % (ref 11.5–15.5)
WBC: 2.6 K/uL — ABNORMAL LOW (ref 4.0–10.5)
nRBC: 0 % (ref 0.0–0.2)

## 2024-04-04 LAB — I-STAT CG4 LACTIC ACID, ED: Lactic Acid, Venous: 3.9 mmol/L (ref 0.5–1.9)

## 2024-04-04 LAB — CORTISOL-AM, BLOOD: Cortisol - AM: 17.6 ug/dL (ref 6.7–22.6)

## 2024-04-04 LAB — FERRITIN: Ferritin: 28 ng/mL (ref 11–307)

## 2024-04-04 LAB — PROTIME-INR
INR: 1 (ref 0.8–1.2)
Prothrombin Time: 13.3 s (ref 11.4–15.2)

## 2024-04-04 MED ORDER — ORAL CARE MOUTH RINSE
15.0000 mL | OROMUCOSAL | Status: DC | PRN
Start: 2024-04-04 — End: 2024-04-07

## 2024-04-04 MED ORDER — FERROUS SULFATE 325 (65 FE) MG PO TABS
325.0000 mg | ORAL_TABLET | Freq: Every day | ORAL | Status: DC
  Administered 2024-04-04 – 2024-04-06 (×3): 325 mg via ORAL
  Filled 2024-04-04 (×3): qty 1

## 2024-04-04 MED ORDER — DORZOLAMIDE HCL-TIMOLOL MAL 2-0.5 % OP SOLN
1.0000 [drp] | Freq: Two times a day (BID) | OPHTHALMIC | Status: DC
  Administered 2024-04-04 – 2024-04-06 (×6): 1 [drp] via OPHTHALMIC
  Filled 2024-04-04: qty 10

## 2024-04-04 MED ORDER — ENOXAPARIN SODIUM 40 MG/0.4ML IJ SOSY
40.0000 mg | PREFILLED_SYRINGE | INTRAMUSCULAR | Status: DC
  Administered 2024-04-04 – 2024-04-06 (×3): 40 mg via SUBCUTANEOUS
  Filled 2024-04-04 (×3): qty 0.4

## 2024-04-04 MED ORDER — POLYETHYLENE GLYCOL 3350 17 G PO PACK: 17.0000 g | PACK | Freq: Every day | ORAL | Status: DC | PRN

## 2024-04-04 MED ORDER — ORAL CARE MOUTH RINSE
15.0000 mL | OROMUCOSAL | Status: DC
  Administered 2024-04-04 – 2024-04-06 (×8): 15 mL via OROMUCOSAL

## 2024-04-04 MED ORDER — PIPERACILLIN-TAZOBACTAM 3.375 G IVPB 30 MIN
3.3750 g | Freq: Three times a day (TID) | INTRAVENOUS | Status: DC
  Administered 2024-04-04: 3.375 g via INTRAVENOUS
  Filled 2024-04-04: qty 50

## 2024-04-04 MED ORDER — DEXTROSE IN LACTATED RINGERS 5 % IV SOLN: INTRAVENOUS | Status: AC

## 2024-04-04 MED ORDER — ATORVASTATIN CALCIUM 40 MG PO TABS
40.0000 mg | ORAL_TABLET | Freq: Every day | ORAL | Status: DC
  Administered 2024-04-04 – 2024-04-06 (×3): 40 mg via ORAL
  Filled 2024-04-04 (×3): qty 1

## 2024-04-04 MED ORDER — ACETAMINOPHEN 325 MG PO TABS
650.0000 mg | ORAL_TABLET | Freq: Four times a day (QID) | ORAL | Status: DC | PRN
  Administered 2024-04-04 – 2024-04-05 (×2): 650 mg via ORAL
  Filled 2024-04-04 (×2): qty 2

## 2024-04-04 MED ORDER — SENNA 8.6 MG PO TABS
1.0000 | ORAL_TABLET | Freq: Two times a day (BID) | ORAL | Status: DC
  Administered 2024-04-04 – 2024-04-06 (×5): 8.6 mg via ORAL
  Filled 2024-04-04 (×5): qty 1

## 2024-04-04 MED ORDER — ONDANSETRON HCL 4 MG PO TABS
4.0000 mg | ORAL_TABLET | Freq: Four times a day (QID) | ORAL | Status: DC | PRN
Start: 2024-04-04 — End: 2024-04-07

## 2024-04-04 MED ORDER — ONDANSETRON HCL 4 MG/2ML IJ SOLN: 4.0000 mg | Freq: Four times a day (QID) | INTRAMUSCULAR | Status: DC | PRN

## 2024-04-04 MED ORDER — LACTATED RINGERS IV BOLUS (SEPSIS): 1000.0000 mL | Freq: Once | INTRAVENOUS | Status: DC

## 2024-04-04 MED ORDER — FAMOTIDINE 20 MG PO TABS
10.0000 mg | ORAL_TABLET | Freq: Every day | ORAL | Status: DC
  Administered 2024-04-04 – 2024-04-06 (×3): 10 mg via ORAL
  Filled 2024-04-04 (×3): qty 1

## 2024-04-04 MED ORDER — PIPERACILLIN-TAZOBACTAM 3.375 G IVPB 30 MIN
3.3750 g | Freq: Once | INTRAVENOUS | Status: AC
  Administered 2024-04-04: 3.375 g via INTRAVENOUS
  Filled 2024-04-04: qty 50

## 2024-04-04 MED ORDER — DUODERM CGF EXTRA THIN EX MISC: 1.0000 | CUTANEOUS | Status: DC | PRN

## 2024-04-04 MED ORDER — LEVOTHYROXINE SODIUM 50 MCG PO TABS
50.0000 ug | ORAL_TABLET | Freq: Every day | ORAL | Status: DC
  Administered 2024-04-04 – 2024-04-06 (×3): 50 ug via ORAL
  Filled 2024-04-04: qty 2
  Filled 2024-04-04 (×2): qty 1

## 2024-04-04 MED ORDER — BRIMONIDINE TARTRATE 0.2 % OP SOLN
1.0000 [drp] | Freq: Two times a day (BID) | OPHTHALMIC | Status: DC
  Administered 2024-04-04 – 2024-04-06 (×6): 1 [drp] via OPHTHALMIC
  Filled 2024-04-04: qty 5

## 2024-04-04 MED ORDER — ACETAMINOPHEN 650 MG RE SUPP: 650.0000 mg | Freq: Four times a day (QID) | RECTAL | Status: DC | PRN

## 2024-04-04 MED ORDER — LACTATED RINGERS IV SOLN: 150.0000 mL/h | INTRAVENOUS | Status: DC

## 2024-04-04 MED ORDER — GLUCERNA SHAKE PO LIQD
237.0000 mL | Freq: Three times a day (TID) | ORAL | Status: DC
  Administered 2024-04-04 – 2024-04-06 (×6): 237 mL via ORAL
  Filled 2024-04-04: qty 237

## 2024-04-04 NOTE — Assessment & Plan Note (Signed)
-   Reordered supplemental shakes.  - consider palliative consult

## 2024-04-04 NOTE — Plan of Care (Signed)
 Called Lonell to update her on patient's status. She reports her sister has been her primary care taker at home and has been administering all of her medications as prescribed.   She requests the day team to call her or her sister when they round so they can listen in on the conversation. Will pass along to day team   Damien Pinal, DO [facility], PGY-3 04/04/24 5:56 AM

## 2024-04-04 NOTE — Assessment & Plan Note (Addendum)
 Meets severe sepsis criteria with initial temperature 94.9, leukopenia, lactic acidosis.  Improved s/p 1 L fluid bolus, regulation of glucose.  - Admit to FMTS, attending Dr. Madelon - Progressive, Vital signs per floor - Carb modified diet, passed bedside swallow  - PT/OT to treat - VTE prophylaxis: Lovenox  - Fluids: S/p 1 L LR, continue LR 150 mL/hr  - Antibiotics: Zosyn  (8/13- ), will elect to continue in setting of leukopenia, lactic acidosis, hypotension, hypothermia - AM CBC/BMP - Fall precautions - Delirium precautions  - f/u blood cultures

## 2024-04-04 NOTE — ED Notes (Addendum)
 Pt completed about half of her glucerna shake. Left at the bedside incase she wanted more at a later time.

## 2024-04-04 NOTE — Assessment & Plan Note (Signed)
 Hgb on admission 8.4. Baseline appears >10. No obvious source of bleeding. Possible dilutional component since labs were obtained s/p fluids  - trend CBC daily  - order iron, ferritin, TIBC  - transfuse <7

## 2024-04-04 NOTE — ED Notes (Signed)
 Family updated as to patient's status. Jacquline ranson (daughter) @ [phone] updated on pt's status. Jacquline requested provider call her with updates.

## 2024-04-04 NOTE — Assessment & Plan Note (Signed)
 Appears to be secondary to decrease oral intake and medication compliance at home. Last A1c 8.3, 2 weeks ago Home meds: Lantus  6 units daily, metformin  1000 mg daily - CBG checks every 2 - Hold LAI - Recommend discontinuing insulin  at discharge and continuing patient on metformin  only

## 2024-04-04 NOTE — Plan of Care (Signed)
  Problem: Clinical Measurements: Goal: Diagnostic test results will improve Outcome: Progressing Goal: Signs and symptoms of infection will decrease Outcome: Progressing   Problem: Respiratory: Goal: Ability to maintain adequate ventilation will improve Outcome: Progressing   Problem: Clinical Measurements: Goal: Ability to maintain clinical measurements within normal limits will improve Outcome: Progressing Goal: Will remain free from infection Outcome: Progressing Goal: Diagnostic test results will improve Outcome: Progressing Goal: Respiratory complications will improve Outcome: Progressing Goal: Cardiovascular complication will be avoided Outcome: Progressing   Problem: Nutrition: Goal: Adequate nutrition will be maintained Outcome: Progressing

## 2024-04-04 NOTE — ED Notes (Signed)
 Pt yelling help instead of using call bell system. Pt asked to have the blanket removed from her d/t being hot. Pt repositioned on ED stretcher locked in lowest position with call bell in reach & demonstration on how to use said call bell system. Pt able to be viewed from nursing station for safety.

## 2024-04-04 NOTE — Hospital Course (Addendum)
 This is a 89 year old female with PMH T2DM, HTN, HLD, dementia admitted to FMTS for hypoglycemia and hypothermia secondary to decreased PO with continued insulin  administration.   Hypoglycemia Patient placed on D5LR after stabilization with D10.  Fluids discontinued once patient was able to p.o.  She tolerated a regular diet.  A1c of 7.1, with a goal of less than 8 given her advanced age.  Discontinued insulin , recommend against aggressive treatment outpatient.  Will continue metformin  given need for small doses of short acting during hospitalization.  She was evaluated by PT/OT/SLP, who recommend continued home health services, full assist with feeds and crushed meds-but regular diet with thin liquids.  She was discharged home with home health services in stable and good condition.  Concern for UTI Initial concern for urinary tract infection, however UA not consistent with infection.  Patient initially met SIRS criteria, however with gentle fluid resuscitation she improved rapidly.  Antibiotics discontinued, and patient remained stable and at baseline.  Please note chronic indwelling suprapubic catheter.  Other chronic conditions were medically managed with home medications and formulary alternatives as necessary (severe dementia, HLD, HTN, glaucoma, overactive bladder, arthritis, sacral pressure wound)  PCP recommendations: Reschedule with geriatrics as she missed the appt due to admission Consider palliative care consult/GOC discussions Aspirin  discontinued (not recommended for primary prevention) Discontinue Myrbetriq  since patient has suprapubic catheter

## 2024-04-04 NOTE — H&P (Signed)
 Hospital Admission History and Physical Service Pager: [phone]  Patient name: Hayley Fisher Medical record number: [id number] Date of Birth: [birth year] Age: 89 y.o. Gender: female  Primary Care Provider: Raguel Lee, DO  Consultants: None Code Status: DNR which was confirmed with family if patient unable to confirm   Preferred Emergency Contact:  Contact Information     Name Relation Home Work [place] Daughter [phone]  [phone]   Cornelius,LaLeisa Daughter   [phone]      Other Contacts   None on File     Chief Complaint: AMS, Hypoglycemia  Differential and Medical Decision Making:  Hayley Fisher is a 89 y.o. female presenting with hypoglycemic event secondary to possible decreased p.o. intake and continued insulin  administration at home meeting SIRS criteria on admission.   Questionable infection with lactic acidosis, hypothermia, leukopenia.  Source could either be chronic suprapubic catheter versus stage III sacral decubitus ulcer present on admission.  CXR does not show active pneumonia.    UA has not yet been collected, will send for culture (suspect colonization with chronic suprapubic cath). Previously grown Serratia resistant to CTX and nitrofurantoin .   Assessment & Plan SIRS (systemic inflammatory response syndrome) (HCC) Hypoglycemia Meets severe sepsis criteria with initial temperature 94.9, leukopenia, lactic acidosis.  Improved s/p 1 L fluid bolus, regulation of glucose.  - Admit to FMTS, attending Dr. Madelon - Progressive, Vital signs per floor - Carb modified diet, passed bedside swallow  - PT/OT to treat - VTE prophylaxis: Lovenox  - Fluids: S/p 1 L LR, continue LR 150 mL/hr  - Antibiotics: Zosyn  (8/13- ), will elect to continue in setting of leukopenia, lactic acidosis, hypotension, hypothermia - AM CBC/BMP - Fall precautions - Delirium precautions  - f/u blood cultures Type 2 diabetes mellitus with hypoglycemia  (HCC) Appears to be secondary to decrease oral intake and medication compliance at home. Last A1c 8.3, 2 weeks ago Home meds: Lantus  6 units daily, metformin  1000 mg daily - CBG checks every 2 - Hold LAI - Recommend discontinuing insulin  at discharge and continuing patient on metformin  only Anemia of unknown etiology Hgb on admission 8.4. Baseline appears >10. No obvious source of bleeding. Possible dilutional component since labs were obtained s/p fluids  - trend CBC daily  - order iron, ferritin, TIBC  - transfuse <7 Malnutrition of moderate degree Failure to thrive in adult - Reordered supplemental shakes.  - consider palliative consult  Chronic health problem Severe dementia: Is AAOx3 at this time, but will ask about recent events frequently.  On Rexulti  1 mg daily at bedtime HLD: Continue Lipitor 40 mg daily HTN: Hold amlodipine  5 mg and Metoprolol  25 mg daily Glaucoma: Brimonidine  0.2% eye drop into L eye BID and Cosopt  bilaterally daily, continued on admission. Overactive bladder: Chronic use of suprapubic catheter. On tamsulosin  0.4 mg daily, held on admission 2/2 to hypotension  Arthritis: uses Tylenol  prn for pain  FEN/GI: Carb modified VTE Prophylaxis: Lovenox   Disposition: Progressive   History of Present Illness:  Hayley Fisher is a 89 y.o. female presenting with hypoglycemia.  Family reports she has not had great p.o. intake over the last few days only eating 1 meal a day from typical 3.  Prior to EMS arrival family went to check on her at mealtime she was not responsive in bed soaked in sweat.  When EMS arrived her glucose was less than 50 and she was given oral glucose and D50 with improvement of her glucose and mentation.  She has been receiving 6 units of Lantus  daily.  Recently admitted on 03/15/2024 for urosepsis.  She she received a myriad of antibiotics while admitted including cefepime , vancomycin , Zosyn , ciprofloxacin  and fosfomycin to equal a 5-day total  course.  Known to have a sacral decubitus ulcer stage III.  Known dementia.  During last hospitalization centrally acting medications were decreased prior to discharge.  In the ED, found to be hypothermic and hypoglycemia.  She received a 1 L bolus and Zosyn  to cover for possible sepsis with known sacral decubitus ulcer v UTI 2/2 chronic suprapubic catheter.  UA was ordered but not collected, blood cultures obtained prior to antibiotic ministration.  CBGs improved and patient was given a diet.  Lactic acid continued up trended from 3.5-3.9.  FMTS called for admission.    Review Of Systems: Per HPI with the following additions: as above  Pertinent Past Medical History: Dementia HLD Hypothyroidism  T2DM  Remainder reviewed in history tab.   Pertinent Past Surgical History: Hernia Repair  Abdominal hysterectomy  Bladder suspension   Remainder reviewed in history tab.   Pertinent Social History: Tobacco use: Former, quit 1982 Alcohol  use: denies Other Substance use: denies Lives with sister, daughter, son-in-law  Pertinent Family History: Mother: colon cancer  Daughter: breast cancer   Important Outpatient Medications: Feeding supplement 2 times a day between meals Acetaminophen  1000 mg every 8 hours as needed Amlodipine  5 mg Aspirin  81 mg Atorvastatin  40 mg Ferrous sulfate  325 mg daily Lantus  6 units daily Levothyroxine  50 mcg daily Metformin  1000 mg daily Metoprolol  25 mg daily MiraLAX  17 g as needed Rexulti  1 mg at bedtime Senna 1 tablet 2 times daily Tamsulosin  0.4 mg daily   Objective: BP (!) 102/58   Pulse 77   Temp (!) 97.3 F (36.3 C) (Oral)   Resp (!) 22   SpO2 100%  Exam: General: Chronically ill-appearing, no acute distress, deconditioned  Cardio: RRR, no murmur on exam Pulm: clear, No increased work of breathing Abdomen: Soft, bowel sounds present, nontender Extremity: No peripheral edema   Labs:  CBC BMET  Recent Labs  Lab 04/03/24 2344   WBC 2.6*  HGB 8.4*  HCT 26.4*  PLT 161   Recent Labs  Lab 04/03/24 2114  NA 136  K 3.7  CL 100  CO2 25  BUN 9  CREATININE 0.62  GLUCOSE 113*  CALCIUM  8.5*    Pertinent additional labs: Lactic acid 3.5>3.9 INR 1.1 Quad screen negative  EKG: NSR, low voltage, no ST elevation, normal intervals, QTc 445   Imaging Studies Performed:  CXR: Poor inspiratory effort, clear costophrenic angles, midline trachea and mediastinum, questionable cardiomegaly, streaky basilar atelectasis   Cleotilde Perkins, DO 04/04/2024, 12:53 AM PGY-3, [facility]  FPTS Intern pager: [phone], text pages welcome Secure chat group [facility] Teaching Service

## 2024-04-04 NOTE — ED Notes (Signed)
 Message sent to the pharmacy, ophthalmic solutions have not been received to administer.

## 2024-04-04 NOTE — Progress Notes (Signed)
   04/04/24 1502  Vitals  Temp 98 F (36.7 C)  Temp Source Oral  BP 136/80  MAP (mmHg) 98  BP Location Right Arm  BP Method Automatic  Patient Position (if appropriate) Lying  Pulse Rate 95  ECG Heart Rate 93  Resp 18  Oxygen Therapy  SpO2 100 %  O2 Device Room Air   Patient arrived from [facility] to 4e07, Patient placed on monitor and ccmd made aware vital signs obtained, skin assessed patient with pressure ulcers on admission please see LDA for descriptions, bed side RN in room and aware of patient arrival Caro Brundidge Jessup RN

## 2024-04-04 NOTE — Progress Notes (Signed)
 FMTS Interim Progress Note  S:She does not have any complaints.   O: BP 123/68   Pulse 87   Temp 98 F (36.7 C) (Oral)   Resp 18   SpO2 99%    CV: RRR Pulm: Diminished breath sounds at the bases, CTAB Abd: soft, nontender, no suprapubic fullness Psych: Oriented to self, location, reason for admission, but not date/year   Lactic acid 3.9<3.5 CBC    Component Value Date/Time   WBC 5.5 04/04/2024 0450   RBC 3.06 (L) 04/04/2024 0450   HGB 9.6 (L) 04/04/2024 0450   HGB 11.0 (L) 02/08/2024 1537   HCT 30.3 (L) 04/04/2024 0450   HCT 36.1 02/08/2024 1537   PLT 209 04/04/2024 0450   PLT 207 02/08/2024 1537   MCV 99.0 04/04/2024 0450   MCV 99 (H) 02/08/2024 1537   MCH 31.4 04/04/2024 0450   MCHC 31.7 04/04/2024 0450   RDW 14.6 04/04/2024 0450   RDW 12.6 02/08/2024 1537   LYMPHSABS 0.3 (L) 04/03/2024 2344   LYMPHSABS 0.7 02/08/2024 1537   MONOABS 0.2 04/03/2024 2344   EOSABS 0.0 04/03/2024 2344   EOSABS 0.1 02/08/2024 1537   BASOSABS 0.0 04/03/2024 2344   BASOSABS 0.0 02/08/2024 1537    A/P: Hypoglycemia - continue on D5LR 125 Concern for UTI - Initial met severe SIRS criteria due to hypothermia, elevated lactic acid, leukopenia. She received Zosyn . Now clinically stable with resolved leukopenia. She does have a suprapubic catheter and history of serratia UTI. UA with no nitrites. She did have large leuks but rare bacteria. Less likely she has a UTI. Will plan to discontinue zosyn  now.  Alena Morrison, Elio, MD 04/04/2024, 7:11 AM PGY-1, [facility] Family Medicine Service pager [phone]

## 2024-04-04 NOTE — Assessment & Plan Note (Addendum)
 Severe dementia: Is AAOx3 at this time, but will ask about recent events frequently.  On Rexulti  1 mg daily at bedtime HLD: Continue Lipitor 40 mg daily HTN: Hold amlodipine  5 mg and Metoprolol  25 mg daily Glaucoma: Brimonidine  0.2% eye drop into L eye BID and Cosopt  bilaterally daily, continued on admission. Overactive bladder: Chronic use of suprapubic catheter. On tamsulosin  0.4 mg daily, held on admission 2/2 to hypotension  Arthritis: uses Tylenol  prn for pain

## 2024-04-05 DIAGNOSIS — L8990 Pressure ulcer of unspecified site, unspecified stage: Secondary | ICD-10-CM | POA: Insufficient documentation

## 2024-04-05 DIAGNOSIS — E162 Hypoglycemia, unspecified: Secondary | ICD-10-CM

## 2024-04-05 DIAGNOSIS — L89313 Pressure ulcer of right buttock, stage 3: Secondary | ICD-10-CM | POA: Insufficient documentation

## 2024-04-05 LAB — LIPID PANEL
Cholesterol: 120 mg/dL (ref 0–200)
HDL: 68 mg/dL (ref >40)
LDL Cholesterol: 44 mg/dL (ref 0–99)
Total CHOL/HDL Ratio: 1.8 ratio
Triglycerides: 42 mg/dL (ref <150)
VLDL: 8 mg/dL (ref 0–40)

## 2024-04-05 LAB — GLUCOSE, CAPILLARY
Glucose-Capillary: 232 mg/dL — ABNORMAL HIGH (ref 70–99)
Glucose-Capillary: 242 mg/dL — ABNORMAL HIGH (ref 70–99)
Glucose-Capillary: 268 mg/dL — ABNORMAL HIGH (ref 70–99)
Glucose-Capillary: 299 mg/dL — ABNORMAL HIGH (ref 70–99)
Glucose-Capillary: 317 mg/dL — ABNORMAL HIGH (ref 70–99)
Glucose-Capillary: 365 mg/dL — ABNORMAL HIGH (ref 70–99)

## 2024-04-05 LAB — URINE CULTURE: Culture: NO GROWTH

## 2024-04-05 LAB — HEMOGLOBIN A1C
Hgb A1c MFr Bld: 7.1 % — ABNORMAL HIGH (ref 4.8–5.6)
Mean Plasma Glucose: 157.07 mg/dL

## 2024-04-05 MED ORDER — INSULIN ASPART 100 UNIT/ML IJ SOLN: 0.0000 [IU] | Freq: Three times a day (TID) | INTRAMUSCULAR | Status: DC

## 2024-04-05 MED ORDER — INSULIN ASPART 100 UNIT/ML IJ SOLN
0.0000 [IU] | Freq: Three times a day (TID) | INTRAMUSCULAR | Status: DC
  Administered 2024-04-05: 9 [IU] via SUBCUTANEOUS
  Administered 2024-04-05 – 2024-04-06 (×4): 5 [IU] via SUBCUTANEOUS

## 2024-04-05 MED ORDER — TAMSULOSIN HCL 0.4 MG PO CAPS
0.4000 mg | ORAL_CAPSULE | Freq: Every day | ORAL | Status: DC
  Administered 2024-04-05 – 2024-04-06 (×2): 0.4 mg via ORAL
  Filled 2024-04-05 (×2): qty 1

## 2024-04-05 MED ORDER — AMLODIPINE BESYLATE 5 MG PO TABS
5.0000 mg | ORAL_TABLET | Freq: Every day | ORAL | Status: DC
  Administered 2024-04-05 – 2024-04-06 (×2): 5 mg via ORAL
  Filled 2024-04-05 (×2): qty 1

## 2024-04-05 MED ORDER — OXYCODONE HCL 5 MG PO TABS
2.5000 mg | ORAL_TABLET | Freq: Four times a day (QID) | ORAL | Status: DC | PRN
  Administered 2024-04-05: 2.5 mg via ORAL
  Filled 2024-04-05: qty 1

## 2024-04-05 MED ORDER — METOPROLOL SUCCINATE ER 25 MG PO TB24
25.0000 mg | ORAL_TABLET | Freq: Every day | ORAL | Status: DC
Start: 2024-04-05 — End: 2024-04-07
  Administered 2024-04-05 – 2024-04-06 (×2): 25 mg via ORAL
  Filled 2024-04-05 (×2): qty 1

## 2024-04-05 NOTE — Plan of Care (Signed)
  Problem: Respiratory: Goal: Ability to maintain adequate ventilation will improve Outcome: Progressing   Problem: Fluid Volume: Goal: Hemodynamic stability will improve Outcome: Progressing   Problem: Clinical Measurements: Goal: Signs and symptoms of infection will decrease Outcome: Progressing   Problem: Coping: Goal: Level of anxiety will decrease Outcome: Progressing   Problem: Skin Integrity: Goal: Risk for impaired skin integrity will decrease Outcome: Progressing   Problem: Coping: Goal: Ability to adjust to condition or change in health will improve Outcome: Progressing   Problem: Tissue Perfusion: Goal: Adequacy of tissue perfusion will improve Outcome: Progressing

## 2024-04-05 NOTE — Progress Notes (Addendum)
 Daily Progress Note Intern Pager: [phone]  Patient name: Hayley Fisher Medical record number: [id number] Date of birth: Jan 11, 1931 Age: 89 y.o. Gender: female  Primary Care Provider: Lennie Raguel MATSU, DO Consultants: none Code Status: DNR  Pt Overview and Major Events to Date:  8/14 - admitted  Assessment and Plan:  This is a 89 year old female admitted for hypoglycemia secondary to insulin  use and poor PO. Unclear etiology for diminished PO. I have attempted to contact family with no success - will try again today. Pertinent PMH/PSH includes dementia, HLD, HTN, anemia, T2DM.  Assessment & Plan Hypoglycemia Glucose stable during admission. Patient is not really taking PO. Recommend further evaluation of decreased PO (illness, depression, worsening dementia, etc) as outpatient, and I will try to contact the family again today for collateral. Patient was supposed to see geri clinic outpatient yesterday but missed due to admission. - Continue to encourage PO - carb modified diet - glucerna shake  - space CBG to q4h - add sensitive SSI with meals - PT/OT/SLP to treat - move to med-surg - AM CBC/BMP - Fall precautions - Delirium precautions  Malnutrition of moderate degree Failure to thrive in adult - OT to eval  - continue supplemental shakes.  Type 2 diabetes mellitus with hypoglycemia (HCC) A1C 7.1. Home meds: Lantus  6 units daily, metformin  1000 mg daily - Recommend discontinuing insulin  at discharge and continuing patient on metformin  only Anemia of unknown etiology Hgb on admission 8.4. Baseline appears >10. No obvious source of bleeding. No iron deficiency. Suspect it was dilutional following IVF. Hgb now 9.6. - trend CBC daily  Pressure injury of skin Updated picture of sacral wound in chart. - wound care consulted Suprapubic catheter [facility]) Initially meeting SIRS criteria, UA from chronic cath with Large Leuks. Covered by zosyn . SIRS likely secondary to  hypoglycemia, no symptoms of UTI. D/c abx yesterday. Clinically, she has remained stable off of abx.  - f/up urine cx - blood culture no growth at 12 hours, will follow Chronic health problem Severe dementia: Not oriented to time.  On Rexulti  1 mg daily at bedtime HLD: Continue Lipitor 40 mg daily; LDL 44, cholesterol 120 HTN: Restart amlodipine  5 mg and Metoprolol  25 mg daily Glaucoma: Brimonidine  0.2% eye drop into L eye BID and Cosopt  bilaterally daily, continued on admission. Overactive bladder: Chronic use of suprapubic catheter. On tamsulosin  0.4 mg daily, restart Arthritis: uses Tylenol  prn for pain  FEN/GI: carb modified PPx: lovenox  Dispo:Pending PT recommendations  pending clinical improvement .   Subjective:  She has no complaints at this time.   Spoke with daughter, who reports her mom has been having trouble swallowing for the 2-3 months. They have been pureeing the food. Crushing/cutting pills to get her to eat them. Patient's last living sister died on Christmas day. Does struggle with depression since family deaths.   Objective: Temp:  [98 F (36.7 C)-98.5 F (36.9 C)] 98.1 F (36.7 C) (08/14 2311) Pulse Rate:  [33-107] 86 (08/15 0652) Resp:  [10-25] 14 (08/15 0652) BP: (117-180)/(59-111) 148/98 (08/15 0652) SpO2:  [94 %-100 %] 94 % (08/15 9347)  Physical Exam: General: sitting up in bed Cardiovascular: tachycardic, regular rhythm, no m/r/g Respiratory: transmitted upper respiratory sounds Abdomen: soft, nontender Psych: Oriented to name, location, and reason for admission; not oriented to year  Laboratory: Most recent CBC Lab Results  Component Value Date   WBC 5.5 04/04/2024   HGB 9.6 (L) 04/04/2024   HCT 30.3 (L) 04/04/2024  MCV 99.0 04/04/2024   PLT 209 04/04/2024   Most recent BMP    Latest Ref Rng & Units 04/04/2024    4:50 AM  BMP  Glucose 70 - 99 mg/dL 94   BUN 8 - 23 mg/dL 8   Creatinine 9.55 - 8.99 mg/dL 9.46   Sodium 864 - 854 mmol/L  139   Potassium 3.5 - 5.1 mmol/L 3.7   Chloride 98 - 111 mmol/L 102   CO2 22 - 32 mmol/L 29   Calcium  8.9 - 10.3 mg/dL 8.5     Other pertinent labs  Iron/TIBC/Ferritin/ %Sat    Component Value Date/Time   IRON 46 04/04/2024 0450   TIBC 245 (L) 04/04/2024 0450   FERRITIN 28 04/04/2024 0450   FERRITIN 30 09/21/2020 1113   IRONPCTSAT 19 04/04/2024 0450   A1c 7.1   Imaging/Diagnostic Tests: None  Alena Morrison, Elio, MD 04/05/2024, 7:09 AM  PGY-1, [facility] FPTS Intern pager: [phone], text pages welcome Secure chat group [facility] Teaching Service

## 2024-04-05 NOTE — Assessment & Plan Note (Addendum)
-   OT to eval  - continue supplemental shakes.

## 2024-04-05 NOTE — Assessment & Plan Note (Signed)
 Initially meeting SIRS criteria, UA from chronic cath with Large Leuks. Covered by zosyn . SIRS likely secondary to hypoglycemia, no symptoms of UTI. D/c abx yesterday. Clinically, she has remained stable off of abx.  - f/up urine cx - blood culture no growth at 12 hours, will follow

## 2024-04-05 NOTE — Assessment & Plan Note (Addendum)
 Updated picture of sacral wound in chart. - wound care consulted

## 2024-04-05 NOTE — Progress Notes (Signed)
 Patient transferred to 5C-12 via bed (air mattress). , daughter was with her.

## 2024-04-05 NOTE — Assessment & Plan Note (Addendum)
 A1C 7.1. Home meds: Lantus  6 units daily, metformin  1000 mg daily - Recommend discontinuing insulin  at discharge and continuing patient on metformin  only

## 2024-04-05 NOTE — Assessment & Plan Note (Signed)
 Hgb on admission 8.4. Baseline appears >10. No obvious source of bleeding. No iron deficiency. Suspect it was dilutional following IVF. Hgb now 9.6. - trend CBC daily

## 2024-04-05 NOTE — Evaluation (Signed)
 Physical Therapy Evaluation Patient Details Name: Hayley Fisher MRN: [id number] DOB: [birth year] Today's Date: 04/05/2024  History of Present Illness  Pt is 89 yo presenting to [facility] on 8/13 due to hypoglycemic event. Recent hospitalization with urosepsis.  PMH includes HTN, T2DM, hypothyroidism, urinary incontinence with chronic indwelling urinary catheter, arthritis, CKD, glaucoma, HLD, syncope  Clinical Impression  Pt is presenting close to baseline level of functioning. Pt reports she has assistance at baseline and transfers via hoyer lift from hospital bed to W/C. Pt would benefit from pressure relieving mattress and W/C cushion due to non-healing sacral wound. Pt has proper equipment and assistance at home. Due to pt current functional status, home set up and available assistance at home recommending skilled physical therapy services 3x/week in order to address strength, balance and functional mobility to decrease risk for falls, injury and re-hospitalization.           If plan is discharge home, recommend the following: Two people to help with walking and/or transfers;Assistance with cooking/housework;Assist for transportation;Help with stairs or ramp for entrance;Supervision due to cognitive status   Can travel by private vehicle   No    Equipment Recommendations Other (comment) (ROHO cushion for wound, air mattress for hospital bed or pressure relieving)     Functional Status Assessment Patient has had a recent decline in their functional status and demonstrates the ability to make significant improvements in function in a reasonable and predictable amount of time.     Precautions / Restrictions Precautions Precautions: Fall Recall of Precautions/Restrictions: Intact Precaution/Restrictions Comments: suprapubic catheter; sacral ulcer Restrictions Weight Bearing Restrictions Per Provider Order: No      Mobility  Bed Mobility Overal bed mobility: Needs Assistance Bed  Mobility: Supine to Sit, Sit to Supine     Supine to sit: Max assist Sit to supine: Max assist   General bed mobility comments: Pt requires physical assist to initiate, able to assist moving LE to EOB and pushing trunk up to midline.    Transfers Overall transfer level: Needs assistance Equipment used: 1 person hand held assist Transfers: Sit to/from Stand Sit to Stand: Total assist           General transfer comment: unable to clear buttocks from EOB. Pt with stiffness in the R knee.    Ambulation/Gait   General Gait Details: non-ambulatory at baseline      Balance Overall balance assessment: Needs assistance Sitting-balance support: No upper extremity supported, Feet supported Sitting balance-Leahy Scale: Poor Sitting balance - Comments: leaning to L , 2-3 min of sitting mid line at close CGA, Intermittent Mod A Postural control: Left lateral lean     Standing balance comment: unable to get to standing.       Pertinent Vitals/Pain Pain Assessment Pain Assessment: Faces Faces Pain Scale: Hurts little more Facial Expression: Tense Body Movements: Protection Muscle Tension: Tense, rigid Vocalization (extubated pts.): Sighing, moaning Pain Location: sacral area with positioning Pain Descriptors / Indicators: Grimacing, Discomfort Pain Intervention(s): Monitored during session, Repositioned    Home Living Family/patient expects to be discharged to:: Private residence Living Arrangements: Children;Other relatives Available Help at Discharge: Family;Available 24 hours/day;Personal care attendant (pt reports she has a girl come until 3 pm Mon-Sat) Type of Home: House Home Access: Stairs to enter Entrance Stairs-Rails: Right Entrance Stairs-Number of Steps: 6   Home Layout: One level Home Equipment: Wheelchair - manual;Hospital bed;Other (comment) Additional Comments: hoyer lift.    Prior Function Prior Level of Function : Needs assist  Mobility Comments: Pt reports she is in bed most of the time. Transfers with hoyer lift to W/C ADLs Comments: reports family would assist with bathing/dressing. Pt has caregiver until 3 pm Mon-Sat     Extremity/Trunk Assessment   Upper Extremity Assessment Upper Extremity Assessment: Generalized weakness RUE Deficits / Details: hand contractures    Lower Extremity Assessment Lower Extremity Assessment: Generalized weakness    Cervical / Trunk Assessment Cervical / Trunk Assessment: Kyphotic  Communication   Communication Communication: No apparent difficulties    Cognition Arousal: Alert Behavior During Therapy: Flat affect   PT - Cognitive impairments: History of cognitive impairments   PT - Cognition Comments: per chart, h/o dementia. oriented to self, year, location.   Following commands impaired: Follows one step commands inconsistently, Follows one step commands with increased time     Cueing Cueing Techniques: Verbal cues, Tactile cues, Gestural cues            Assessment/Plan    PT Assessment Patient needs continued PT services  PT Problem List Decreased strength;Decreased activity tolerance;Decreased balance;Decreased mobility;Decreased range of motion;Decreased cognition;Decreased knowledge of use of DME;Impaired sensation;Decreased skin integrity       PT Treatment Interventions DME instruction;Functional mobility training;Therapeutic activities;Therapeutic exercise;Balance training;Neuromuscular re-education;Cognitive remediation;Patient/family education;Wheelchair mobility training    PT Goals (Current goals can be found in the Care Plan section)  Acute Rehab PT Goals Patient Stated Goal: decrease pain PT Goal Formulation: With patient Time For Goal Achievement: 04/19/24 Potential to Achieve Goals: Fair    Frequency Min 1X/week        AM-PAC PT 6 Clicks Mobility  Outcome Measure Help needed turning from your back to your side while in a  flat bed without using bedrails?: A Lot Help needed moving from lying on your back to sitting on the side of a flat bed without using bedrails?: A Lot Help needed moving to and from a bed to a chair (including a wheelchair)?: Total Help needed standing up from a chair using your arms (e.g., wheelchair or bedside chair)?: Total Help needed to walk in hospital room?: Total Help needed climbing 3-5 steps with a railing? : Total 6 Click Score: 8    End of Session   Activity Tolerance: Patient limited by pain;Patient limited by fatigue Patient left: in bed;with bed alarm set;with call bell/phone within reach Nurse Communication: Mobility status PT Visit Diagnosis: Muscle weakness (generalized) (M62.81);Other abnormalities of gait and mobility (R26.89)    Time: 8966-8947 PT Time Calculation (min) (ACUTE ONLY): 19 min   Charges:   PT Evaluation $PT Eval Low Complexity: 1 Low   PT General Charges $$ ACUTE PT VISIT: 1 Visit         Dorothyann Maier, DPT, CLT  Acute Rehabilitation Services Office: [phone] (Secure chat preferred)   Dorothyann VEAR Maier 04/05/2024, 11:03 AM

## 2024-04-05 NOTE — Assessment & Plan Note (Addendum)
 Glucose stable during admission. Patient is not really taking PO. Recommend further evaluation of decreased PO (illness, depression, worsening dementia, etc) as outpatient, and I will try to contact the family again today for collateral. Patient was supposed to see geri clinic outpatient yesterday but missed due to admission. - Continue to encourage PO - carb modified diet - glucerna shake  - space CBG to q4h - add sensitive SSI with meals - PT/OT/SLP to treat - move to med-surg - AM CBC/BMP - Fall precautions - Delirium precautions

## 2024-04-05 NOTE — Assessment & Plan Note (Addendum)
 Severe dementia: Not oriented to time.  On Rexulti  1 mg daily at bedtime HLD: Continue Lipitor 40 mg daily; LDL 44, cholesterol 120 HTN: Restart amlodipine  5 mg and Metoprolol  25 mg daily Glaucoma: Brimonidine  0.2% eye drop into L eye BID and Cosopt  bilaterally daily, continued on admission. Overactive bladder: Chronic use of suprapubic catheter. On tamsulosin  0.4 mg daily, restart Arthritis: uses Tylenol  prn for pain

## 2024-04-05 NOTE — Consult Note (Signed)
 WOC Nurse Consult Note: Reason for Consult: Stg 3/unstageable wound on buttocks; Deep tissue injury  Patient admitted with AMS and hypoglycemia. History of dementia from home  Wound type: Stage 3 Pressure Injury; sacrum: 100% clean; full thickness Stage 3 Pressure Injury: right buttock; 100% clean; full thickness Stage 2 Pressure Injury: left buttock: 100% clean; partial thickness Under pannus; skin fold; next to SP cath; linear; partial thickness; fissure Right distal buttock; healed Pressure Injury POA: Yes Measurement:see nursing flow seets Wound bed: see above  Drainage (amount, consistency, odor) see nursing flowsheets Periwound: intact  Dressing procedure/placement/frequency: Cleanse buttock and sacral wounds with saline, pat dry Cover each with single layer of xeroform and top with foam. Change xeroform daily, assess for acute changes; change foam every 3 days and PRN soilage Add LALM for moisture management and pressure redistribution Turn and reposition per hospital policy   Re consult if needed, will not follow at this time. Thanks  Axyl Sitzman [employer], RN,CWOCN, CNS, [employer] [phone]

## 2024-04-05 NOTE — Plan of Care (Addendum)
 I spoke with Lonell Smack regarding her mother, Hayley Fisher. She reports her mother has not been able to swallow well for the last 2-3 months. Her family has been pureeing her food. No recent illness to incite her decline in PO prior to admission. MS. Mccormack has 3 living children. She lives with her daughter Londa Fleet and Ms. Corenlius' husband. Ms. Randson manages logistics such as appts and communicating with doctors. Her son, Reily Treloar, is the mPOA, per Ms. Randson. His number is [phone]) [phone]. Current concerns for QoL, particularly Ms. Basher' bedsore, per Ms. Randson.  Dr. [facility]

## 2024-04-06 LAB — GLUCOSE, CAPILLARY
Glucose-Capillary: 259 mg/dL — ABNORMAL HIGH (ref 70–99)
Glucose-Capillary: 284 mg/dL — ABNORMAL HIGH (ref 70–99)
Glucose-Capillary: 281 mg/dL — ABNORMAL HIGH (ref 70–99)
Glucose-Capillary: 170 mg/dL — ABNORMAL HIGH (ref 70–99)
Glucose-Capillary: 103 mg/dL — ABNORMAL HIGH (ref 70–99)

## 2024-04-06 NOTE — Evaluation (Signed)
 Clinical/Bedside Swallow Evaluation Patient Details  Name: Hayley Fisher MRN: [id number] Date of Birth: 1931/08/11  Today's Date: 04/06/2024 Time: SLP Start Time (ACUTE ONLY): 0908 SLP Stop Time (ACUTE ONLY): 0920 SLP Time Calculation (min) (ACUTE ONLY): 12 min  Past Medical History:  Past Medical History:  Diagnosis Date   Arthritis    Cataract    bil cateracts removed   Chronic kidney disease    spot on one of my kidneys per pt   Diabetes mellitus    Diarrhea 02/20/2022   Gastroesophageal reflux disease without esophagitis 09/16/2020   Glaucoma    Goals of care, counseling/discussion 09/09/2021   Hyperkalemia 07/02/2020   Hyperlipidemia    Hyperplastic colon polyp    Hypertension    Hypothyroidism 12/09/2020   Leukocytopenia 06/27/2021   Metabolic acidosis 07/02/2020   S/P IVC filter 01/16/2021   DVT 12/2020   Syncope    Thyroid  disease    Urge incontinence 05/05/2022   Past Surgical History:  Past Surgical History:  Procedure Laterality Date   ABDOMINAL HYSTERECTOMY     BLADDER SUSPENSION     bladder tacking     COLONOSCOPY     EYE SURGERY     INGUINAL HERNIA REPAIR Right    IR CATHETER TUBE CHANGE  09/15/2022   IR IVC FILTER PLMT / S&I /IMG GUID/MOD SED  01/16/2021   IR US  GUIDE BX ASP/DRAIN  08/02/2022   HPI:  89 yo presenting to [facility] on 8/13 due to hypoglycemic event. Recent hospitalization with urosepsis.  PMH includes HTN, T2DM, hypothyroidism, urinary incontinence with chronic indwelling urinary catheter, arthritis, CKD, glaucoma, HLD, syncope    Assessment / Plan / Recommendation  Clinical Impression  Pt seen for clinical swallowing evaluation this admission with reports of decreased PO intake and at home diet modifications to puree per notes of chart/family report from difficulty swallowing. Of note, pt seen by SLP last admission 02/2024 with recommendations of regular thin liquids. Pt was alert, upper and lower partials placed amongst pts existing  natural dentition. Oral hygiene was intact. Pt was alert and able to answer simple questions though question some accuracy. Pt masticated solid, consumed puree, ice chips, and thin liquids without overt s/sx of aspiration or difficulty. Would recommend regular thin liquid diet with ability for pt and family to choose preference options and improve PO intake vs restricting to puree diet. SLP relayed info to nursing and MD. No further ST needs identified. SLP Visit Diagnosis: Dysphagia, unspecified (R13.10)    Aspiration Risk  Mild aspiration risk    Diet Recommendation   Thin;Age appropriate regular  Medication Administration: Crushed with puree (as tolerated)    Other  Recommendations Oral Care Recommendations: Oral care BID     Assistance Recommended at Discharge    Functional Status Assessment Patient has not had a recent decline in their functional status  Frequency and Duration            Prognosis Barriers to Reach Goals: Cognitive deficits      Swallow Study   General Date of Onset: 04/03/24 HPI: 89 yo presenting to [facility] on 8/13 due to hypoglycemic event. Recent hospitalization with urosepsis.  PMH includes HTN, T2DM, hypothyroidism, urinary incontinence with chronic indwelling urinary catheter, arthritis, CKD, glaucoma, HLD, syncope Type of Study: Bedside Swallow Evaluation Previous Swallow Assessment: 02/2024; unremarkable, recs for regular thin liquids Diet Prior to this Study: Thin liquids (Level 0);Dysphagia 1 (pureed) Temperature Spikes Noted: No Respiratory Status: Room air History of Recent Intubation: No  Behavior/Cognition: Alert;Cooperative;Pleasant mood Oral Cavity Assessment: Within Functional Limits Oral Care Completed by SLP: No Oral Cavity - Dentition: Adequate natural dentition (upper and lower partials placed amongst natural dentition) Vision: Functional for self-feeding Self-Feeding Abilities: Needs set up Patient Positioning: Upright in bed Baseline  Vocal Quality: Low vocal intensity Volitional Cough: Strong Volitional Swallow: Able to elicit    Oral/Motor/Sensory Function Overall Oral Motor/Sensory Function: Within functional limits   Ice Chips Ice chips: Within functional limits Presentation: Spoon   Thin Liquid Thin Liquid: Within functional limits Presentation: Cup;Straw    Nectar Thick Nectar Thick Liquid: Not tested   Honey Thick Honey Thick Liquid: Not tested   Puree Puree: Within functional limits   Solid     Solid: Within functional limits      Hayley Fisher DEL. MA, CCC-SLP Acute Rehabilitation Services   04/06/2024,9:28 AM

## 2024-04-06 NOTE — Assessment & Plan Note (Addendum)
 Glucose stable during admission. Patient is not really taking PO. Recommend further evaluation of decreased PO (illness, depression, worsening dementia, etc) as outpatient, and I will try to contact the family again today for collateral. Patient was supposed to see geri clinic outpatient yesterday but missed due to admission. - Continue to encourage PO - carb modified diet - glucerna shake  - space CBG to q4h - add sensitive SSI with meals - PT/OT/SLP to treat - Fall precautions - Delirium precautions

## 2024-04-06 NOTE — Assessment & Plan Note (Signed)
 Updated picture of sacral wound in chart. - wound care consulted

## 2024-04-06 NOTE — TOC Transition Note (Signed)
 Transition of Care [facility]) - Discharge Note   Patient Details  Name: Hayley Fisher MRN: [id number] Date of Birth: 01-Aug-1931  Transition of Care [facility]) CM/SW Contact:  Marval Gell, RN Phone Number: 04/06/2024, 5:01 PM   Clinical Narrative:     Spoke to daughter, confirmed address and that someone is home. Patient active in [employer] with [facility], requested order from Dr Howell and notified liaison of DC.  PTAR forms sent to unit and PTAR called   Final next level of care: Home w Home Health Services Barriers to Discharge: No Barriers Identified   Patient Goals and CMS Choice   CMS Medicare.gov Compare Post Acute Care list provided to:: Patient Represenative (must comment) Choice offered to / list presented to : Adult Children      Discharge Placement                       Discharge Plan and Services Additional resources added to the After Visit Summary for                              [facility] Agency: [facility] Date [facility] Agency Contacted: 04/06/24 Time HH Agency Contacted: 1701 Representative spoke with at [facility] Agency: Darleene  Social Drivers of Health (SDOH) Interventions SDOH Screenings   Food Insecurity: Patient Unable To Answer (04/05/2024)  Housing: Unknown (04/05/2024)  Transportation Needs: Patient Unable To Answer (04/05/2024)  Utilities: Not At Risk (03/16/2024)  Alcohol  Screen: Low Risk  (10/26/2023)  Depression (PHQ2-9): Low Risk  (04/01/2024)  Recent Concern: Depression (PHQ2-9) - Medium Risk (02/08/2024)  Financial Resource Strain: Low Risk  (10/26/2023)  Physical Activity: Inactive (10/26/2023)  Social Connections: Patient Unable To Answer (04/05/2024)  Stress: No Stress Concern Present (10/26/2023)  Tobacco Use: Medium Risk (04/03/2024)  Health Literacy: Inadequate Health Literacy (10/26/2023)     Readmission Risk Interventions    03/15/2024    2:01 PM  Readmission Risk Prevention Plan  Transportation Screening Complete  PCP or Specialist Appt  within 5-7 Days Complete  Home Care Screening Complete  Medication Review (RN CM) Complete

## 2024-04-06 NOTE — Progress Notes (Signed)
 Daily Progress Note Intern Pager: [phone]  Patient name: Hayley Fisher Medical record number: [id number] Date of birth: 1930/11/03 Age: 89 y.o. Gender: female  Primary Care Provider: Lennie Raguel MATSU, DO Consultants: none Code Status: DNR  Pt Overview and Major Events to Date:  8/14 - admitted  Assessment and Plan:  This is a 89yo F admitted for hypoglycemia 2/2 insulin  use and poor PO. Unclear etiology for diminished PO.   Pertinent PMH/PSH includes dementia, HLD, HTN, anemia, T2DM.  Assessment & Plan Hypoglycemia Glucose stable during admission. Patient is not really taking PO. Recommend further evaluation of decreased PO (illness, depression, worsening dementia, etc) as outpatient, and I will try to contact the family again today for collateral. Patient was supposed to see geri clinic outpatient yesterday but missed due to admission. - Continue to encourage PO - carb modified diet - glucerna shake  - space CBG to q4h - add sensitive SSI with meals - PT/OT/SLP to treat - Fall precautions - Delirium precautions  Malnutrition of moderate degree Failure to thrive in adult - OT to eval  - continue supplemental shakes.  Type 2 diabetes mellitus with hypoglycemia (HCC) A1C 7.1. Home meds: Lantus  6 units daily, metformin  1000 mg daily - Recommend discontinuing insulin  at discharge and continuing patient on metformin  only Anemia of unknown etiology Hgb on admission 8.4. Baseline appears >10. No obvious source of bleeding. No iron deficiency. Suspect it was dilutional following IVF. Hgb now 9.6. - No new labs today Pressure injury of skin Updated picture of sacral wound in chart. - wound care consulted Suprapubic catheter [facility]) Initially meeting SIRS criteria, UA from chronic cath with Large Leuks. Covered by zosyn . SIRS likely secondary to hypoglycemia, no symptoms of UTI. D/c abx on 8/14. Clinically, she has remained stable off of abx.  - urine cx with no growth on final  report - blood culture no growth at 36 hours, will follow Chronic health problem Severe dementia: Not oriented to time.  On Rexulti  1 mg daily at bedtime HLD: Continue Lipitor 40 mg daily; LDL 44, cholesterol 120 HTN: Restart amlodipine  5 mg and Metoprolol  25 mg daily Glaucoma: Brimonidine  0.2% eye drop into L eye BID and Cosopt  bilaterally daily, continued on admission. Overactive bladder: Chronic use of suprapubic catheter. On tamsulosin  0.4 mg daily, restart Arthritis: uses Tylenol  prn for pain   FEN/GI: carb modified PPx: lovenox  Dispo:Pending PT recommendations  pending clinical improvement . Barriers include OT consult.   Subjective:  Patient asleep this AM, woke for examination. No complaints at this time.  Objective: Temp:  [97.8 F (36.6 C)-98.9 F (37.2 C)] 97.9 F (36.6 C) (08/16 0317) Pulse Rate:  [86-100] 88 (08/16 0317) Resp:  [16-19] 16 (08/16 0317) BP: (106-177)/(62-93) 133/75 (08/16 0317) SpO2:  [95 %-100 %] 97 % (08/16 0317)  Physical Exam: General: Asleep in bed, easily woken for exam Cardiovascular: RRR, no m/r/g Respiratory: CTAB, no wheezing or crackles Abdomen: soft, non-distended  Laboratory: Most recent CBC Lab Results  Component Value Date   WBC 5.5 04/04/2024   HGB 9.6 (L) 04/04/2024   HCT 30.3 (L) 04/04/2024   MCV 99.0 04/04/2024   PLT 209 04/04/2024   Most recent BMP    Latest Ref Rng & Units 04/04/2024    4:50 AM  BMP  Glucose 70 - 99 mg/dL 94   BUN 8 - 23 mg/dL 8   Creatinine 9.55 - 8.99 mg/dL 9.46   Sodium 864 - 854 mmol/L 139   Potassium  3.5 - 5.1 mmol/L 3.7   Chloride 98 - 111 mmol/L 102   CO2 22 - 32 mmol/L 29   Calcium  8.9 - 10.3 mg/dL 8.5     Other pertinent labs: Iron/TIBC/Ferritin/ %Sat Labs (Brief)          Component Value Date/Time    IRON 46 04/04/2024 0450    TIBC 245 (L) 04/04/2024 0450    FERRITIN 28 04/04/2024 0450    FERRITIN 30 09/21/2020 1113    IRONPCTSAT 19 04/04/2024 0450      A1c 7.1    Imaging/Diagnostic Tests: None  Jerrie Gathers, DO 04/06/2024, 7:26 AM  PGY-1, [facility] FPTS Intern pager: [phone], text pages welcome Secure chat group [facility] Teaching Service

## 2024-04-06 NOTE — Assessment & Plan Note (Addendum)
 Hgb on admission 8.4. Baseline appears >10. No obvious source of bleeding. No iron deficiency. Suspect it was dilutional following IVF. Hgb now 9.6. - No new labs today

## 2024-04-06 NOTE — Evaluation (Signed)
 Occupational Therapy Evaluation Patient Details Name: Hayley Fisher MRN: [id number] DOB: [birth year] Today's Date: 04/06/2024   History of Present Illness   Pt is 89 yo presenting to [facility] on 8/13 due to hypoglycemic event. Recent hospitalization with urosepsis.  PMH includes HTN, T2DM, hypothyroidism, urinary incontinence with chronic indwelling urinary catheter, arthritis, CKD, glaucoma, HLD, syncope     Clinical Impressions PTA, pt lived with daughter and son in law and had PCA. Pt predominantly dependent at baseline, recently working with [facility] PT/OT to prevent functional decline working on UE strengthening and posture/seated balance. Pt needing up to max-total A for bed mobility, transfers, and BADL. Will follow acutely and recommend continued HHOT/PT/aid at home.    If plan is discharge home, recommend the following:   Two people to help with walking and/or transfers;Two people to help with bathing/dressing/bathroom;A lot of help with walking and/or transfers;A lot of help with bathing/dressing/bathroom;Supervision due to cognitive status;Direct supervision/assist for financial management;Assistance with feeding;Direct supervision/assist for medications management;Assistance with cooking/housework     Functional Status Assessment   Patient has had a recent decline in their functional status and demonstrates the ability to make significant improvements in function in a reasonable and predictable amount of time.     Equipment Recommendations   None recommended by OT     Recommendations for Other Services         Precautions/Restrictions   Precautions Precautions: Fall Recall of Precautions/Restrictions: Intact Precaution/Restrictions Comments: suprapubic catheter; sacral ulcer Restrictions Weight Bearing Restrictions Per Provider Order: No     Mobility Bed Mobility Overal bed mobility: Needs Assistance Bed Mobility: Supine to Sit, Sit to Supine     Supine to sit:  Max assist Sit to supine: Max assist   General bed mobility comments: Pt requires physical assist to initiate, able to assist moving LE to EOB and pushing trunk up to midline. step by step cues    Transfers Overall transfer level: Needs assistance Equipment used: 1 person hand held assist Transfers: Sit to/from Stand Sit to Stand: Total assist           General transfer comment: daughter assisting with face to face transfer      Balance Overall balance assessment: Needs assistance Sitting-balance support: No upper extremity supported, Feet supported Sitting balance-Leahy Scale: Poor     Standing balance support: Bilateral upper extremity supported Standing balance-Leahy Scale: Poor Standing balance comment: able to clear bottom but does not demonstrate full extension of hips or knees for standing                           ADL either performed or assessed with clinical judgement   ADL Overall ADL's : Needs assistance/impaired Eating/Feeding: Maximal assistance;Bed level   Grooming: Moderate assistance;Bed level;Sitting           Upper Body Dressing : Total assistance       Toilet Transfer: Total assistance           Functional mobility during ADLs: Maximal assistance;+2 for safety/equipment;Total assistance       Vision Ability to See in Adequate Light: 2 Moderately impaired Vision Assessment?: Vision impaired- to be further tested in functional context     Perception         Praxis         Pertinent Vitals/Pain Pain Assessment Pain Assessment: PAINAD Pain Intervention(s): Monitored during session     Extremity/Trunk Assessment Upper Extremity Assessment Upper Extremity Assessment: Generalized weakness;RUE deficits/detail;LUE  deficits/detail RUE Deficits / Details: hand contractures, bicep tightness with decr elbow extension, decreased shouler ROM LUE Deficits / Details: hand contractures, bicep tightness with decr elbow extension,  decreased shouler ROM. L worse than R   Lower Extremity Assessment Lower Extremity Assessment: Defer to PT evaluation   Cervical / Trunk Assessment Cervical / Trunk Assessment: Kyphotic   Communication Communication Communication: No apparent difficulties   Cognition Arousal: Alert Behavior During Therapy: Flat affect Cognition: Cognition impaired   Orientation impairments: Time, Situation Awareness: Intellectual awareness impaired Memory impairment (select all impairments): Short-term memory Attention impairment (select first level of impairment): Selective attention Executive functioning impairment (select all impairments): Initiation, Problem solving OT - Cognition Comments: per daughter, dementia at baseline. suspect pt is near baseline                 Following commands: Impaired Following commands impaired: Follows one step commands inconsistently, Follows one step commands with increased time     Cueing  General Comments   Cueing Techniques: Verbal cues;Tactile cues;Gestural cues      Exercises Exercises: Other exercises Other Exercises Other Exercises: seated EOB, neck ROM, posture promoting exercises, bil UE reaching   Shoulder Instructions      Home Living Family/patient expects to be discharged to:: Private residence Living Arrangements: Children;Other relatives Available Help at Discharge: Family;Available 24 hours/day;Personal care attendant (PCA Mon-sat until 3) Type of Home: House Home Access: Stairs to enter [employer] of Steps: 6 Entrance Stairs-Rails: Right Home Layout: One level     Bathroom Shower/Tub: Sponge bathes at baseline   [employer]: Standard     Home Equipment: Wheelchair - manual;Hospital bed;Other (comment)   Additional Comments: hoyer lift. has bil resting hand splints, but doesnt wear all that often      Prior Functioning/Environment Prior Level of Function : Needs assist             Mobility  Comments: Pt reports she is in bed most of the time. Transfers with hoyer lift to W/C ADLs Comments: reports family would assist with bathing/dressing. Pt has caregiver until 3 pm Mon-Sat    OT Problem List: Impaired balance (sitting and/or standing);Decreased strength;Decreased activity tolerance;Pain;Impaired UE functional use;Decreased cognition;Impaired vision/perception   OT Treatment/Interventions: Self-care/ADL training;Therapeutic exercise;Patient/family education;Balance training;Therapeutic activities;DME and/or AE instruction      OT Goals(Current goals can be found in the care plan section)   Acute Rehab OT Goals Patient Stated Goal: get better OT Goal Formulation: With patient Time For Goal Achievement: 04/20/24 Potential to Achieve Goals: Fair   OT Frequency:  Min 2X/week    Co-evaluation              AM-PAC OT 6 Clicks Daily Activity     Outcome Measure Help from another person eating meals?: A Lot Help from another person taking care of personal grooming?: A Lot Help from another person toileting, which includes using toliet, bedpan, or urinal?: Total Help from another person bathing (including washing, rinsing, drying)?: A Lot Help from another person to put on and taking off regular upper body clothing?: Total Help from another person to put on and taking off regular lower body clothing?: Total 6 Click Score: 9   End of Session Equipment Utilized During Treatment: Gait belt Nurse Communication: Mobility status;Other (comment)  Activity Tolerance: Patient tolerated treatment well Patient left: in bed;with call bell/phone within reach;with bed alarm set  OT Visit Diagnosis: Other abnormalities of gait and mobility (R26.89);Unsteadiness on feet (R26.81);Muscle weakness (generalized) (M62.81);Other  symptoms and signs involving cognitive function;Pain                Time: 1500-1530 OT Time Calculation (min): 30 min Charges:  OT General Charges $OT  Visit: 1 Visit OT Evaluation $OT Eval Moderate Complexity: 1 Mod OT Treatments $Therapeutic Activity: 8-22 mins  Hayley Fisher FREDERICK, OTR/L [facility] Acute Rehabilitation Office: [phone]   Hayley Fisher 04/06/2024, 3:41 PM

## 2024-04-06 NOTE — Assessment & Plan Note (Signed)
 A1C 7.1. Home meds: Lantus  6 units daily, metformin  1000 mg daily - Recommend discontinuing insulin  at discharge and continuing patient on metformin  only

## 2024-04-06 NOTE — Discharge Summary (Addendum)
 Family Medicine Teaching [facility] Discharge Summary  Patient name: Hayley Fisher Medical record number: [id number] Date of birth: November 18, 1930 Age: 89 y.o. Gender: female Date of Admission: 04/03/2024  Date of Discharge: 04/06/24 Admitting Physician: Donald CHRISTELLA Lai, DO  Primary Care Provider: Lennie Raguel MATSU, DO Consultants: PT/OT/SLP  Indication for Hospitalization: hypoglycemia, decreased PO intake  Brief Hospital Course:  This is a 89 year old female with PMH T2DM, HTN, HLD, dementia admitted to [facility]MT[facility] for hypoglycemia and hypothermia secondary to decreased PO with continued insulin  administration.   Hypoglycemia Patient placed on D5LR after stabilization with D10.  Fluids discontinued once patient was able to p.o.  She tolerated a regular diet.  A1c of 7.1, with a goal of less than 8 given her advanced age.  Discontinued insulin , recommend against aggressive treatment outpatient.  Will continue metformin  given need for small doses of short acting during hospitalization.  She was evaluated by PT/OT/SLP, who recommend continued home health services, full assist with feeds and crushed meds-but regular diet with thin liquids.  She was discharged home with home health services in stable and good condition.  Concern for UTI Initial concern for urinary tract infection, however UA not consistent with infection.  Patient initially met SIRS criteria, however with gentle fluid resuscitation she improved rapidly.  Antibiotics discontinued, and patient remained stable and at baseline.  Please note chronic indwelling suprapubic catheter.  Other chronic conditions were medically managed with home medications and formulary alternatives as necessary (severe dementia, HLD, HTN, glaucoma, overactive bladder, arthritis, sacral pressure wound)  PCP recommendations: Reschedule with geriatrics as she missed the appt due to admission Consider palliative care consult/GOC discussions Aspirin   discontinued (not recommended for primary prevention) Discontinue Myrbetriq  since patient has suprapubic catheter  Disposition: Home  Discharge Condition: Stable  Discharge Exam: per progress note on 8/16  Significant Procedures: None  Discharge Medications:  Allergies as of 04/06/2024       Reactions   Levemir  [insulin  Detemir] Itching   Tramadol  Itching, Other (See Comments)   Takes occasionally with Benadryl         Medication List     STOP taking these medications    Accu-Chek Aviva Plus test strip Generic drug: glucose blood   aspirin  81 MG chewable tablet   cephALEXin  500 MG capsule Commonly known as: KEFLEX    Insulin  Pen Needle 32G X 4 MM Misc   Lantus  SoloStar 100 UNIT/ML Solostar Pen Generic drug: insulin  glargine   multivitamin with minerals Tabs tablet   Myrbetriq  25 MG Tb24 tablet Generic drug: mirabegron  ER   Sarna lotion Generic drug: camphor-menthol    Vyzulta  0.024 % Soln Generic drug: Latanoprostene Bunod    Zinc  Sulfate 220 (50 Zn) MG Tabs       TAKE these medications    Acetaminophen  Extra Strength 500 MG Tabs Take 2 tablets (1,000 mg total) by mouth every 8 (eight) hours as needed for moderate pain (pain score 4-6).   amLODipine  5 MG tablet Commonly known as: NORVASC  Take 1 tablet (5 mg total) by mouth daily.   ascorbic acid  500 MG tablet Commonly known as: VITAMIN C  Take 1 tablet (500 mg total) by mouth 2 (two) times daily.   atorvastatin  40 MG tablet Commonly known as: LIPITOR Take 1 tablet (40 mg total) by mouth daily.   brexpiprazole  2 MG Tabs tablet Commonly known as: Rexulti  Take 1 tablet (2 mg total) by mouth daily.   brimonidine  0.2 % ophthalmic solution Commonly known as: ALPHAGAN  Place 1 drop into  the left eye 2 (two) times daily.   diclofenac  Sodium 1 % Gel Commonly known as: Voltaren  Apply 2 g topically 4 (four) times daily. Apply over the affected area.  Please avoid the genital areas.    dorzolamide -timolol  2-0.5 % ophthalmic solution Commonly known as: COSOPT  Place 1 drop into both eyes 2 (two) times daily.   DuoDERM CGF Extra Thin Misc Apply 1 each topically every three (3) days as needed (Change every 3-5 days, or sooner if soiled.).   famotidine  20 MG tablet Commonly known as: PEPCID  Take 20 mg by mouth daily.   feeding supplement (GLUCERNA SHAKE) Liqd Take 237 mLs by mouth 3 (three) times daily between meals. What changed: Another medication with the same name was removed. Continue taking this medication, and follow the directions you see here.   FeroSul 325 (65 Fe) MG tablet Generic drug: ferrous sulfate  TAKE 1 TABLET BY MOUTH DAILY WITH BREAKFAST   levothyroxine  50 MCG tablet Commonly known as: SYNTHROID  Take 1 tablet (50 mcg total) by mouth daily.   metFORMIN  500 MG 24 hr tablet Commonly known as: GLUCOPHAGE -XR Take 2 tablets (1,000 mg total) by mouth daily with breakfast.   metoprolol  succinate 25 MG 24 hr tablet Commonly known as: TOPROL -XL Take 1 tablet (25 mg total) by mouth daily.   polyethylene glycol powder 17 GM/SCOOP powder Commonly known as: GLYCOLAX /MIRALAX  Take 17 g by mouth in the morning and at bedtime. What changed: when to take this   psyllium 58.6 % packet Commonly known as: METAMUCIL Take 1 packet by mouth daily.   senna 8.6 MG Tabs tablet Commonly known as: SENOKOT Take 1 tablet (8.6 mg total) by mouth 2 (two) times daily. What changed:  when to take this reasons to take this   tamsulosin  0.4 MG Caps capsule Commonly known as: FLOMAX  Take 1 capsule (0.4 mg total) by mouth daily.       ASK your doctor about these medications    lidocaine -prilocaine  cream Commonly known as: EMLA  Apply 1 Application topically as needed (Apply to ulcer as needed for pain.). Ask about: Should I take this medication?        Discharge Instructions: Please refer to Patient Instructions section of EMR for full details.  Patient was  counseled important signs and symptoms that should prompt return to medical care, changes in medications, dietary instructions, activity restrictions, and follow up appointments.   Follow-Up Appointments:  Follow-up Information     Lennie Raguel MATSU, DO. Schedule an appointment as soon as possible for a visit.   Specialty: Family Medicine Why: Call first thing Monday to make hospital follow-up with medicine center Contact information: [address] [place] [address] [phone]         Care, [facility] Follow up.   Specialty: Home Health Services Contact information: [address] [place] [address] [phone]                 Darren Jernigan, DO 04/06/2024, 5:31 PM PGY-1, [place] [facility]   Upper Level Addendum: I have seen and evaluated this patient along with Dr. Jernigan and reviewed the above note, making necessary revisions as appropriate. I agree with the medical decision making and physical exam as noted above. Gladis Church, DO PGY-3 [facility]

## 2024-04-06 NOTE — Discharge Instructions (Addendum)
 Dear Hayley Fisher,  Thank you for letting us  participate in your care.  POST-HOSPITAL & CARE INSTRUCTIONS We recommend you continue to take your metformin . Crush meds that she cannot swallow, can put with pure or applesauce You can stop aspirin .  For more details on your medications, see the updated medication list attached. Go to your follow up appointments (listed below)   DOCTOR'S APPOINTMENT   Future Appointments  Date Time Provider Department Center  05/02/2024  1:15 PM Loreda Hacker, DPM [facility]  05/03/2024 11:30 AM Shamleffer, Donell Cardinal, MD [facility] None  10/28/2024  1:40 PM [facility] ANNUAL WELLNESS VISIT [facility]     Take care and be well!  Family Medicine Teaching Service Inpatient Team [place]  [facility]  [address] [place], [place] [address] [phone]   Wound Care Recommendations per Inpatient WOCN:  Cleanse buttock and sacral wounds with saline, pat dry Cover each with single layer of xeroform and top with foam. Change xeroform daily, assess for acute changes; change foam every 3 days and PRN soilage

## 2024-04-06 NOTE — Assessment & Plan Note (Addendum)
 Initially meeting SIRS criteria, UA from chronic cath with Large [place]. Covered by zosyn . SIRS likely secondary to hypoglycemia, no symptoms of UTI. D/c abx on 8/14. Clinically, she has remained stable off of abx.  - urine cx with no growth on final report - blood culture no growth at 36 hours, will follow

## 2024-04-06 NOTE — Plan of Care (Signed)

## 2024-04-06 NOTE — Assessment & Plan Note (Signed)
 Severe dementia: Not oriented to time.  On Rexulti  1 mg daily at bedtime HLD: Continue Lipitor 40 mg daily; LDL 44, cholesterol 120 HTN: Restart amlodipine  5 mg and Metoprolol  25 mg daily Glaucoma: Brimonidine  0.2% eye drop into L eye BID and Cosopt  bilaterally daily, continued on admission. Overactive bladder: Chronic use of suprapubic catheter. On tamsulosin  0.4 mg daily, restart Arthritis: uses Tylenol  prn for pain

## 2024-04-06 NOTE — Progress Notes (Signed)
 DC order noted per MD. Primary RN handling discharge home/HH. Noted patient going home with [facility] home [facility]. Added HH/wound care/med details to AVS for discharge instructions. Primary RN notified.

## 2024-04-06 NOTE — Assessment & Plan Note (Signed)
-   OT to eval  - continue supplemental shakes.

## 2024-04-07 LAB — GLUCOSE, CAPILLARY: Glucose-Capillary: 112 mg/dL — ABNORMAL HIGH (ref 70–99)

## 2024-04-07 NOTE — Plan of Care (Signed)
  Problem: Fluid Volume: Goal: Hemodynamic stability will improve Outcome: Completed/Met   Problem: Clinical Measurements: Goal: Diagnostic test results will improve Outcome: Completed/Met Goal: Signs and symptoms of infection will decrease Outcome: Completed/Met   Problem: Respiratory: Goal: Ability to maintain adequate ventilation will improve Outcome: Completed/Met   Problem: Education: Goal: Knowledge of General Education information will improve Description: Including pain rating scale, medication(s)/side effects and non-pharmacologic comfort measures Outcome: Completed/Met   Problem: Health Behavior/Discharge Planning: Goal: Ability to manage health-related needs will improve Outcome: Completed/Met   Problem: Clinical Measurements: Goal: Ability to maintain clinical measurements within normal limits will improve Outcome: Completed/Met Goal: Will remain free from infection Outcome: Completed/Met Goal: Diagnostic test results will improve Outcome: Completed/Met Goal: Respiratory complications will improve Outcome: Completed/Met Goal: Cardiovascular complication will be avoided Outcome: Completed/Met   Problem: Activity: Goal: Risk for activity intolerance will decrease Outcome: Completed/Met   Problem: Nutrition: Goal: Adequate nutrition will be maintained Outcome: Completed/Met   Problem: Coping: Goal: Level of anxiety will decrease Outcome: Completed/Met   Problem: Elimination: Goal: Will not experience complications related to bowel motility Outcome: Completed/Met Goal: Will not experience complications related to urinary retention Outcome: Completed/Met   Problem: Pain Managment: Goal: General experience of comfort will improve and/or be controlled Outcome: Completed/Met   Problem: Safety: Goal: Ability to remain free from injury will improve Outcome: Completed/Met   Problem: Skin Integrity: Goal: Risk for impaired skin integrity will  decrease Outcome: Completed/Met   Problem: Education: Goal: Ability to describe self-care measures that may prevent or decrease complications (Diabetes Survival Skills Education) will improve Outcome: Completed/Met Goal: Individualized Educational Video(s) Outcome: Completed/Met   Problem: Coping: Goal: Ability to adjust to condition or change in health will improve Outcome: Completed/Met   Problem: Fluid Volume: Goal: Ability to maintain a balanced intake and output will improve Outcome: Completed/Met   Problem: Health Behavior/Discharge Planning: Goal: Ability to identify and utilize available resources and services will improve Outcome: Completed/Met Goal: Ability to manage health-related needs will improve Outcome: Completed/Met   Problem: Metabolic: Goal: Ability to maintain appropriate glucose levels will improve Outcome: Completed/Met   Problem: Nutritional: Goal: Maintenance of adequate nutrition will improve Outcome: Completed/Met Goal: Progress toward achieving an optimal weight will improve Outcome: Completed/Met   Problem: Skin Integrity: Goal: Risk for impaired skin integrity will decrease Outcome: Completed/Met   Problem: Tissue Perfusion: Goal: Adequacy of tissue perfusion will improve Outcome: Completed/Met

## 2024-04-08 ENCOUNTER — Telehealth: Payer: Self-pay

## 2024-04-08 LAB — CULTURE, BLOOD (ROUTINE X 2)
Culture: NO GROWTH
Culture: NO GROWTH
Special Requests: ADEQUATE

## 2024-04-08 NOTE — Transitions of Care (Post Inpatient/ED Visit) (Signed)
   04/08/2024  Name: Hayley Fisher MRN: [id number] DOB: March 05, 1931  Today's TOC FU Call Status: Today's TOC FU Call Status:: Unsuccessful Call (1st Attempt) Unsuccessful Call (1st Attempt) Date: 04/08/24  Attempted to reach the patient regarding the most recent Inpatient/ED visit.  Follow Up Plan: Additional outreach attempts will be made to reach the patient to complete the Transitions of Care (Post Inpatient/ED visit) call.   Arvin Seip RN, BSN, CCM [employer], Population Health Case Manager Phone: [phone]

## 2024-04-10 ENCOUNTER — Telehealth: Payer: Self-pay

## 2024-04-10 NOTE — Transitions of Care (Post Inpatient/ED Visit) (Signed)
   04/10/2024  Name: Hayley Fisher MRN: [id number] DOB: 10/10/1930  Today's TOC FU Call Status: Today's TOC FU Call Status:: Unsuccessful Call (2nd Attempt) Unsuccessful Call (2nd Attempt) Date: 04/10/24  Attempted to reach the patient regarding the most recent Inpatient/ED visit.  Follow Up Plan: Additional outreach attempts will be made to reach the patient to complete the Transitions of Care (Post Inpatient/ED visit) call.    Bing Edison MSN, RN RN Case Sales executive Health  [facility]-Population Health Office Hours M-F [phone] Direct Dial : [phone] Main Phone [phone]  Fax: [phone] [email or web address]

## 2024-04-11 ENCOUNTER — Telehealth: Payer: Self-pay

## 2024-04-11 NOTE — Transitions of Care (Post Inpatient/ED Visit) (Signed)
   04/11/2024  Name: Hayley Fisher MRN: [id number] DOB: 16-Mar-1931  Today's TOC FU Call Status: Today's TOC FU Call Status:: Unsuccessful Call (3rd Attempt) Unsuccessful Call (3rd Attempt) Date: 04/11/24  Attempted to reach the patient regarding the most recent Inpatient/ED visit.Call is disconnected each time, unable to leave a voice mail with daughter as indicated on DPR.   Follow Up Plan: No further outreach attempts will be made at this time. We have been unable to contact the patient.   Bing Edison MSN, RN RN Case Sales executive Health  [facility]-Population Health Office Hours M-F [phone] Direct Dial : [phone] Main Phone [phone]  Fax: [phone] [email or web address]

## 2024-04-12 DIAGNOSIS — E785 Hyperlipidemia, unspecified: Secondary | ICD-10-CM | POA: Diagnosis not present

## 2024-04-12 DIAGNOSIS — H409 Unspecified glaucoma: Secondary | ICD-10-CM | POA: Diagnosis not present

## 2024-04-12 DIAGNOSIS — E119 Type 2 diabetes mellitus without complications: Secondary | ICD-10-CM | POA: Diagnosis not present

## 2024-04-12 DIAGNOSIS — Z435 Encounter for attention to cystostomy: Secondary | ICD-10-CM | POA: Diagnosis not present

## 2024-04-12 DIAGNOSIS — I10 Essential (primary) hypertension: Secondary | ICD-10-CM | POA: Diagnosis not present

## 2024-04-12 DIAGNOSIS — E039 Hypothyroidism, unspecified: Secondary | ICD-10-CM | POA: Diagnosis not present

## 2024-04-12 DIAGNOSIS — K219 Gastro-esophageal reflux disease without esophagitis: Secondary | ICD-10-CM | POA: Diagnosis not present

## 2024-04-12 DIAGNOSIS — Z7984 Long term (current) use of oral hypoglycemic drugs: Secondary | ICD-10-CM | POA: Diagnosis not present

## 2024-04-12 DIAGNOSIS — L89313 Pressure ulcer of right buttock, stage 3: Secondary | ICD-10-CM | POA: Diagnosis not present

## 2024-04-12 DIAGNOSIS — Z794 Long term (current) use of insulin: Secondary | ICD-10-CM | POA: Diagnosis not present

## 2024-04-12 DIAGNOSIS — G9340 Encephalopathy, unspecified: Secondary | ICD-10-CM | POA: Diagnosis not present

## 2024-04-12 DIAGNOSIS — Z466 Encounter for fitting and adjustment of urinary device: Secondary | ICD-10-CM | POA: Diagnosis not present

## 2024-04-12 DIAGNOSIS — Z7982 Long term (current) use of aspirin: Secondary | ICD-10-CM | POA: Diagnosis not present

## 2024-04-12 DIAGNOSIS — D649 Anemia, unspecified: Secondary | ICD-10-CM | POA: Diagnosis not present

## 2024-04-12 DIAGNOSIS — N3281 Overactive bladder: Secondary | ICD-10-CM | POA: Diagnosis not present

## 2024-04-12 DIAGNOSIS — Z9181 History of falling: Secondary | ICD-10-CM | POA: Diagnosis not present

## 2024-04-15 DIAGNOSIS — I10 Essential (primary) hypertension: Secondary | ICD-10-CM | POA: Diagnosis not present

## 2024-04-15 DIAGNOSIS — G9340 Encephalopathy, unspecified: Secondary | ICD-10-CM | POA: Diagnosis not present

## 2024-04-15 DIAGNOSIS — Z435 Encounter for attention to cystostomy: Secondary | ICD-10-CM | POA: Diagnosis not present

## 2024-04-15 DIAGNOSIS — D649 Anemia, unspecified: Secondary | ICD-10-CM | POA: Diagnosis not present

## 2024-04-15 DIAGNOSIS — N3281 Overactive bladder: Secondary | ICD-10-CM | POA: Diagnosis not present

## 2024-04-15 DIAGNOSIS — Z7984 Long term (current) use of oral hypoglycemic drugs: Secondary | ICD-10-CM | POA: Diagnosis not present

## 2024-04-15 DIAGNOSIS — H409 Unspecified glaucoma: Secondary | ICD-10-CM | POA: Diagnosis not present

## 2024-04-15 DIAGNOSIS — L89313 Pressure ulcer of right buttock, stage 3: Secondary | ICD-10-CM | POA: Diagnosis not present

## 2024-04-15 DIAGNOSIS — K219 Gastro-esophageal reflux disease without esophagitis: Secondary | ICD-10-CM | POA: Diagnosis not present

## 2024-04-15 DIAGNOSIS — Z7982 Long term (current) use of aspirin: Secondary | ICD-10-CM | POA: Diagnosis not present

## 2024-04-15 DIAGNOSIS — Z466 Encounter for fitting and adjustment of urinary device: Secondary | ICD-10-CM | POA: Diagnosis not present

## 2024-04-15 DIAGNOSIS — Z794 Long term (current) use of insulin: Secondary | ICD-10-CM | POA: Diagnosis not present

## 2024-04-15 DIAGNOSIS — E785 Hyperlipidemia, unspecified: Secondary | ICD-10-CM | POA: Diagnosis not present

## 2024-04-15 DIAGNOSIS — Z9181 History of falling: Secondary | ICD-10-CM | POA: Diagnosis not present

## 2024-04-15 DIAGNOSIS — E119 Type 2 diabetes mellitus without complications: Secondary | ICD-10-CM | POA: Diagnosis not present

## 2024-04-15 DIAGNOSIS — E039 Hypothyroidism, unspecified: Secondary | ICD-10-CM | POA: Diagnosis not present

## 2024-04-17 ENCOUNTER — Telehealth: Payer: Self-pay

## 2024-04-17 DIAGNOSIS — G9340 Encephalopathy, unspecified: Secondary | ICD-10-CM | POA: Diagnosis not present

## 2024-04-17 DIAGNOSIS — E785 Hyperlipidemia, unspecified: Secondary | ICD-10-CM | POA: Diagnosis not present

## 2024-04-17 DIAGNOSIS — H409 Unspecified glaucoma: Secondary | ICD-10-CM | POA: Diagnosis not present

## 2024-04-17 DIAGNOSIS — Z7984 Long term (current) use of oral hypoglycemic drugs: Secondary | ICD-10-CM | POA: Diagnosis not present

## 2024-04-17 DIAGNOSIS — E039 Hypothyroidism, unspecified: Secondary | ICD-10-CM | POA: Diagnosis not present

## 2024-04-17 DIAGNOSIS — D649 Anemia, unspecified: Secondary | ICD-10-CM | POA: Diagnosis not present

## 2024-04-17 DIAGNOSIS — K219 Gastro-esophageal reflux disease without esophagitis: Secondary | ICD-10-CM | POA: Diagnosis not present

## 2024-04-17 DIAGNOSIS — Z9181 History of falling: Secondary | ICD-10-CM | POA: Diagnosis not present

## 2024-04-17 DIAGNOSIS — L89313 Pressure ulcer of right buttock, stage 3: Secondary | ICD-10-CM | POA: Diagnosis not present

## 2024-04-17 DIAGNOSIS — Z794 Long term (current) use of insulin: Secondary | ICD-10-CM | POA: Diagnosis not present

## 2024-04-17 DIAGNOSIS — E119 Type 2 diabetes mellitus without complications: Secondary | ICD-10-CM | POA: Diagnosis not present

## 2024-04-17 DIAGNOSIS — Z7982 Long term (current) use of aspirin: Secondary | ICD-10-CM | POA: Diagnosis not present

## 2024-04-17 DIAGNOSIS — Z466 Encounter for fitting and adjustment of urinary device: Secondary | ICD-10-CM | POA: Diagnosis not present

## 2024-04-17 DIAGNOSIS — N3281 Overactive bladder: Secondary | ICD-10-CM | POA: Diagnosis not present

## 2024-04-17 DIAGNOSIS — Z435 Encounter for attention to cystostomy: Secondary | ICD-10-CM | POA: Diagnosis not present

## 2024-04-17 NOTE — Telephone Encounter (Signed)
 Eulalio RN with [facility] calls nurse line requesting verbal orders as follows.   Cath changes every 28 days.  Attempted to call Angelina back to give VO. However, no answer or confidential VM.  Will await her return call.

## 2024-04-18 ENCOUNTER — Telehealth: Payer: Self-pay

## 2024-04-18 ENCOUNTER — Other Ambulatory Visit: Payer: Self-pay

## 2024-04-18 NOTE — Telephone Encounter (Signed)
 Spoke with patients daughter Lonell Smack [phone]. To make Geri appt for Sept. 4th. Geri packet is being sent out. Nelson Land, CMA

## 2024-04-23 ENCOUNTER — Telehealth: Payer: Self-pay

## 2024-04-23 ENCOUNTER — Ambulatory Visit

## 2024-04-23 DIAGNOSIS — Z9181 History of falling: Secondary | ICD-10-CM | POA: Diagnosis not present

## 2024-04-23 DIAGNOSIS — D649 Anemia, unspecified: Secondary | ICD-10-CM | POA: Diagnosis not present

## 2024-04-23 DIAGNOSIS — N3281 Overactive bladder: Secondary | ICD-10-CM | POA: Diagnosis not present

## 2024-04-23 DIAGNOSIS — E039 Hypothyroidism, unspecified: Secondary | ICD-10-CM | POA: Diagnosis not present

## 2024-04-23 DIAGNOSIS — H409 Unspecified glaucoma: Secondary | ICD-10-CM | POA: Diagnosis not present

## 2024-04-23 DIAGNOSIS — Z7984 Long term (current) use of oral hypoglycemic drugs: Secondary | ICD-10-CM | POA: Diagnosis not present

## 2024-04-23 DIAGNOSIS — L89313 Pressure ulcer of right buttock, stage 3: Secondary | ICD-10-CM | POA: Diagnosis not present

## 2024-04-23 DIAGNOSIS — I10 Essential (primary) hypertension: Secondary | ICD-10-CM | POA: Diagnosis not present

## 2024-04-23 DIAGNOSIS — Z435 Encounter for attention to cystostomy: Secondary | ICD-10-CM | POA: Diagnosis not present

## 2024-04-23 DIAGNOSIS — Z794 Long term (current) use of insulin: Secondary | ICD-10-CM | POA: Diagnosis not present

## 2024-04-23 DIAGNOSIS — K219 Gastro-esophageal reflux disease without esophagitis: Secondary | ICD-10-CM | POA: Diagnosis not present

## 2024-04-23 DIAGNOSIS — E785 Hyperlipidemia, unspecified: Secondary | ICD-10-CM | POA: Diagnosis not present

## 2024-04-23 DIAGNOSIS — Z466 Encounter for fitting and adjustment of urinary device: Secondary | ICD-10-CM | POA: Diagnosis not present

## 2024-04-23 DIAGNOSIS — E119 Type 2 diabetes mellitus without complications: Secondary | ICD-10-CM | POA: Diagnosis not present

## 2024-04-23 DIAGNOSIS — Z7982 Long term (current) use of aspirin: Secondary | ICD-10-CM | POA: Diagnosis not present

## 2024-04-23 DIAGNOSIS — G9340 Encephalopathy, unspecified: Secondary | ICD-10-CM | POA: Diagnosis not present

## 2024-04-23 NOTE — Telephone Encounter (Signed)
 Spoke with patients daughter Lonell Smack [phone]. To confirm that they are keeping their Geri appt on 9/4. She stated that they are keeping it. Nelson Land, CMA

## 2024-04-25 ENCOUNTER — Ambulatory Visit

## 2024-04-25 VITALS — BP 139/84 | HR 81 | Ht 62.0 in

## 2024-04-25 DIAGNOSIS — R4789 Other speech disturbances: Secondary | ICD-10-CM

## 2024-04-25 DIAGNOSIS — Z515 Encounter for palliative care: Secondary | ICD-10-CM | POA: Diagnosis not present

## 2024-04-25 DIAGNOSIS — F03918 Unspecified dementia, unspecified severity, with other behavioral disturbance: Secondary | ICD-10-CM | POA: Diagnosis not present

## 2024-04-25 DIAGNOSIS — Z23 Encounter for immunization: Secondary | ICD-10-CM

## 2024-04-25 DIAGNOSIS — F5104 Psychophysiologic insomnia: Secondary | ICD-10-CM

## 2024-04-25 DIAGNOSIS — Z636 Dependent relative needing care at home: Secondary | ICD-10-CM | POA: Diagnosis not present

## 2024-04-25 DIAGNOSIS — L89313 Pressure ulcer of right buttock, stage 3: Secondary | ICD-10-CM

## 2024-04-25 DIAGNOSIS — L89322 Pressure ulcer of left buttock, stage 2: Secondary | ICD-10-CM

## 2024-04-25 DIAGNOSIS — F03C18 Unspecified dementia, severe, with other behavioral disturbance: Secondary | ICD-10-CM

## 2024-04-25 DIAGNOSIS — Z79899 Other long term (current) drug therapy: Secondary | ICD-10-CM | POA: Diagnosis not present

## 2024-04-25 NOTE — Patient Instructions (Signed)
 We will look into medications that help with sleep in the family of medications called orexin blockers.    We will look into the cost to this medication before we try to prescribe it.

## 2024-04-25 NOTE — Progress Notes (Unsigned)
 Hayley Fisher is {Pc accompanied by:5710} Sources of clinical information for visit is/are {Information source:60032}. Nursing assessment for this office visit was reviewed with the patient for accuracy and revision.   Previous Report(s) Reviewed: {Outside review:15817}     04/01/2024    2:45 PM  Depression screen PHQ 2/9  Decreased Interest 0  Down, Depressed, Hopeless 0  PHQ - 2 Score 0  Altered sleeping 1  Tired, decreased energy 1  Change in appetite 0  Feeling bad or failure about yourself  0  Trouble concentrating 0  Moving slowly or fidgety/restless 0  Suicidal thoughts 0  PHQ-9 Score 2  Difficult doing work/chores Somewhat difficult   Flowsheet Row Office Visit from 04/01/2024 in [facility] - [facility] Of [place]. [facility] Office Visit from 02/08/2024 in [facility] - [facility] Dept Of [place]. [facility] Office Visit from 10/31/2023 in [facility] - [facility] Dept Of [place]. [facility]  Thoughts that you would be better off dead, or of hurting yourself in some way Not at all Not at all Not at all  PHQ-9 Total Score 2 5 0       04/25/2024    2:28 PM 03/06/2024    9:35 AM 02/08/2024    2:39 PM 10/31/2023    3:39 PM 10/26/2023    1:50 PM  Fall Risk   Falls in the past year? 0 0 0 0 0  Number falls in past yr: 0 0 0 0 0  Injury with Fall? 0 0 0 0 0  Risk for fall due to :     No Fall Risks  Follow up     Falls prevention discussed;Falls evaluation completed       04/01/2024    2:45 PM 02/08/2024    2:39 PM 10/31/2023    3:39 PM  PHQ9 SCORE ONLY  PHQ-9 Total Score 2  5  0      Data saved with a previous flowsheet row definition    There are no preventive care reminders to display for this patient.  Health Maintenance Due  Topic Date Due   Zoster Vaccines- Shingrix (1 of 2) Never done   DTaP/Tdap/Td (2 - Tdap) 08/23/2011   OPHTHALMOLOGY EXAM  05/05/2023   COVID-19 Vaccine (4 - 2025-26 season) 04/22/2024       History/P.E. limitations: {exam; limitations ed:60112}  There are no preventive care reminders to display for this patient.  Diabetes Health Maintenance Due  Topic Date Due   OPHTHALMOLOGY EXAM  05/05/2023   HEMOGLOBIN A1C  10/06/2024   FOOT EXAM  10/30/2024    Health Maintenance Due  Topic Date Due   Zoster Vaccines- Shingrix (1 of 2) Never done   DTaP/Tdap/Td (2 - Tdap) 08/23/2011   OPHTHALMOLOGY EXAM  05/05/2023   COVID-19 Vaccine (4 - 2025-26 season) 04/22/2024     No chief complaint on file.    Discussed the use of AI scribe software for clinical note transcription with the patient, who gave verbal consent to proceed.  History of Present Illness      SDOH Screenings   Food Insecurity: Patient Unable To Answer (04/05/2024)  Housing: Unknown (04/05/2024)  Transportation Needs: Patient Unable To Answer (04/05/2024)  Utilities: Not At Risk (03/16/2024)  Alcohol  Screen: Low Risk  (10/26/2023)  Depression (PHQ2-9): Low Risk  (04/01/2024)  Recent Concern: Depression (PHQ2-9) - Medium Risk (02/08/2024)  Financial Resource Strain: Low Risk  (  10/26/2023)  Physical Activity: Inactive (10/26/2023)  Social Connections: Patient Unable To Answer (04/05/2024)  Stress: No Stress Concern Present (10/26/2023)  Tobacco Use: Medium Risk (04/25/2024)  Health Literacy: Inadequate Health Literacy (10/26/2023)   --------------------------------------------------------------------------------------------------------------------------------------------- Visit Problem List with Assessment and Plan   Assessment and Plan Assessment & Plan      No problem-specific Assessment & Plan notes found for this encounter.

## 2024-04-26 ENCOUNTER — Telehealth: Payer: Self-pay

## 2024-04-26 ENCOUNTER — Encounter: Payer: Self-pay | Admitting: Family Medicine

## 2024-04-26 DIAGNOSIS — Z636 Dependent relative needing care at home: Secondary | ICD-10-CM | POA: Insufficient documentation

## 2024-04-26 DIAGNOSIS — Z515 Encounter for palliative care: Secondary | ICD-10-CM | POA: Insufficient documentation

## 2024-04-26 MED ORDER — BELSOMRA 10 MG PO TABS: 1.0000 | ORAL_TABLET | Freq: Every day | ORAL | 0 refills | Status: DC

## 2024-04-26 NOTE — Assessment & Plan Note (Addendum)
 Established problem Ongoing problem of calling out for her daughter or SIL at night. Disrupting caregiver's sleep multiple times a night  Non-pharmacological interventions that have some evidence of benefit needs-driven behaviors.  These interventions are best when used in combination.  Music and audiobook stimulation (daughter is already doing this) Audiotapes of reassuring familiar voices and language on loop Pet therapy (if patient is comfortable with pets in bedroom) Aromatherapy (evidence is mostly in hospitalized patients)

## 2024-04-26 NOTE — Assessment & Plan Note (Signed)
 Established problem Hayley Fisher' combination of moderate to severe dementia with pressure ulcers makes palliative care emphasis appropriate

## 2024-04-26 NOTE — Assessment & Plan Note (Signed)
 Established problem Ongoing problems, especially with nocturnal calling out for family members to come to her bedroom.  Uncertain origin of this behavior: Disturbances of perception/thoughts (disturbing hallucinations), mood (anxiety/depression), or learned behavior (loss of self-comforting skills). The situation is complicated by the diurnal inversion of Hayley Fisher' sleep pattern that does not match the usual sleep pattern of her caretakers.  Unfortunately, this mismatch   This inversion is usually the result of sleep phase advancement of the circadian sleep pattern.  The patients start of their long-sleep phase earlier than others leading to their long-sleep occurring during the afternoon, and their wake phase during the night.   Non-pharmacological intervention Behavioral intervention Usual recommended treatment of this circadian disorder is to gradually delay the patient's onset of sleep to 15- 30 minutes later every few days.  It is recommended that this intervention be accompanied by sunlight exposure later in the day for a couple hours to help reset the circadian rhythm.     Continuing [facility] Physical Therapy/Occupational Therapy is helpful to keep patient stimulated and awake during the day as well as giving them exercise.   Pharmacological Interventions Use of sleeping adds during this transition back to an family-aligned sleep rhythm has conflicting evidence of benefit as the difficulty is keeping the person awake rather than putting them to sleep.   Pharmacological interventions If there are disturbing nocturnal hallucinations, then antipsychotic medications would be appropriate. Hayley Fisher does show increased motor tone and bradykinesia (no tremor). Given these dopaminergic signs and her prolonged sedation after exposure to quetiapine  with brexpiprazole  during recent hospitalization, it may be prudent to avoid antidopaminergic medications.         If something was needed for disturbing  hallucinations, then the 5-HT2A inverse receptor antagonist antipsychotic may be useful and with less   adverse effects.  If mood disordering is thought to be a major contributor to Hayley Fisher's nocturnal behaviors, consider restarting her Cymbalta  she has taken without report of adverse effect in past or trial of mirtazapine 7.5 mg at bedtime for sleep and mood.  If sleep induction is needed at some point, neither trazodone nor Belsomra  (orexin antagonist) are on the list of potentially inappropriate medications in the elderly.

## 2024-04-26 NOTE — Assessment & Plan Note (Signed)
 Established problem that has improved and has meet goal of wound closure.  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

## 2024-04-26 NOTE — Assessment & Plan Note (Addendum)
 Uncertain role of Flomax  in patient with indwelling foley bladder catheter and indication for iron supplementation.  I discussed with Ms Hayley Fisher trial of stopping both medications.  She agreed to a trial off these two medications.   Return to clinic in ~ 6 weeks with PCP to check Hgb and see how patient doing off the two meds.    Melatonin was not helpful.  Daughter stopped it.

## 2024-04-26 NOTE — Telephone Encounter (Signed)
 Patient will see pcp on 10/16 @315  pm

## 2024-04-29 ENCOUNTER — Other Ambulatory Visit: Payer: Self-pay

## 2024-04-29 DIAGNOSIS — R350 Frequency of micturition: Secondary | ICD-10-CM

## 2024-05-02 ENCOUNTER — Ambulatory Visit (INDEPENDENT_AMBULATORY_CARE_PROVIDER_SITE_OTHER): Admitting: Podiatry

## 2024-05-02 ENCOUNTER — Encounter: Payer: Self-pay | Admitting: Podiatry

## 2024-05-02 VITALS — Ht 62.0 in | Wt 145.5 lb

## 2024-05-02 DIAGNOSIS — B351 Tinea unguium: Secondary | ICD-10-CM | POA: Diagnosis not present

## 2024-05-02 DIAGNOSIS — E1140 Type 2 diabetes mellitus with diabetic neuropathy, unspecified: Secondary | ICD-10-CM | POA: Diagnosis not present

## 2024-05-02 DIAGNOSIS — M79675 Pain in left toe(s): Secondary | ICD-10-CM

## 2024-05-02 DIAGNOSIS — M79674 Pain in right toe(s): Secondary | ICD-10-CM | POA: Diagnosis not present

## 2024-05-02 DIAGNOSIS — Z794 Long term (current) use of insulin: Secondary | ICD-10-CM

## 2024-05-02 NOTE — Progress Notes (Signed)
This patient returns to my office for at risk foot care.  This patient requires this care by a professional since this patient will be at risk due to having diabetes.  This patient is unable to cut nails herself since the patient cannot reach her nails.These nails are painful walking and wearing shoes.  She presents to the office with her son  in a wheelchair.   This patient presents for at risk foot care today.  General Appearance  Alert, conversant and in no acute stress.  Vascular  Dorsalis pedis and posterior tibial  pulses are  weakly palpable  bilaterally.  Capillary return is within normal limits  bilaterally. Temperature is within normal limits  bilaterally.  Neurologic  Senn-Weinstein monofilament wire test within normal limits  bilaterally. Muscle power within normal limits bilaterally.  Nails Thick disfigured discolored nails with subungual debris  from hallux to fifth toes bilaterally. No evidence of bacterial infection or drainage bilaterally.  Orthopedic  No limitations of motion  feet .  No crepitus or effusions noted.  No bony pathology or digital deformities noted.  Skin  normotropic skin with no porokeratosis noted bilaterally.  No signs of infections or ulcers noted.     Onychomycosis  Pain in right toes  Pain in left toes  Consent was obtained for treatment procedures.   Mechanical debridement of nails 1-5  bilaterally performed with a nail nipper.  Filed with dremel without incident.    Return office visit   4  months                   Told patient to return for periodic foot care and evaluation due to potential at risk complications.   Helane Gunther DPM

## 2024-05-03 ENCOUNTER — Ambulatory Visit: Admitting: Internal Medicine

## 2024-05-08 DIAGNOSIS — L89313 Pressure ulcer of right buttock, stage 3: Secondary | ICD-10-CM | POA: Diagnosis not present

## 2024-05-08 DIAGNOSIS — Z435 Encounter for attention to cystostomy: Secondary | ICD-10-CM | POA: Diagnosis not present

## 2024-05-08 DIAGNOSIS — N3281 Overactive bladder: Secondary | ICD-10-CM | POA: Diagnosis not present

## 2024-05-08 DIAGNOSIS — E119 Type 2 diabetes mellitus without complications: Secondary | ICD-10-CM | POA: Diagnosis not present

## 2024-05-08 DIAGNOSIS — Z9181 History of falling: Secondary | ICD-10-CM | POA: Diagnosis not present

## 2024-05-08 DIAGNOSIS — I10 Essential (primary) hypertension: Secondary | ICD-10-CM | POA: Diagnosis not present

## 2024-05-08 DIAGNOSIS — Z7982 Long term (current) use of aspirin: Secondary | ICD-10-CM | POA: Diagnosis not present

## 2024-05-08 DIAGNOSIS — E039 Hypothyroidism, unspecified: Secondary | ICD-10-CM | POA: Diagnosis not present

## 2024-05-08 DIAGNOSIS — Z7984 Long term (current) use of oral hypoglycemic drugs: Secondary | ICD-10-CM | POA: Diagnosis not present

## 2024-05-08 DIAGNOSIS — Z466 Encounter for fitting and adjustment of urinary device: Secondary | ICD-10-CM | POA: Diagnosis not present

## 2024-05-08 DIAGNOSIS — H409 Unspecified glaucoma: Secondary | ICD-10-CM | POA: Diagnosis not present

## 2024-05-08 DIAGNOSIS — Z794 Long term (current) use of insulin: Secondary | ICD-10-CM | POA: Diagnosis not present

## 2024-05-08 DIAGNOSIS — D649 Anemia, unspecified: Secondary | ICD-10-CM | POA: Diagnosis not present

## 2024-05-08 DIAGNOSIS — K219 Gastro-esophageal reflux disease without esophagitis: Secondary | ICD-10-CM | POA: Diagnosis not present

## 2024-05-08 DIAGNOSIS — G9340 Encephalopathy, unspecified: Secondary | ICD-10-CM | POA: Diagnosis not present

## 2024-05-08 DIAGNOSIS — E785 Hyperlipidemia, unspecified: Secondary | ICD-10-CM | POA: Diagnosis not present

## 2024-05-09 ENCOUNTER — Encounter: Payer: Self-pay | Admitting: Family Medicine

## 2024-05-10 ENCOUNTER — Other Ambulatory Visit: Payer: Self-pay | Admitting: Family Medicine

## 2024-05-10 DIAGNOSIS — F99 Mental disorder, not otherwise specified: Secondary | ICD-10-CM

## 2024-05-10 DIAGNOSIS — F03918 Unspecified dementia, unspecified severity, with other behavioral disturbance: Secondary | ICD-10-CM

## 2024-05-10 MED ORDER — TRAZODONE HCL 50 MG PO TABS: 25.0000 mg | ORAL_TABLET | Freq: Every evening | ORAL | 0 refills | Status: DC | PRN

## 2024-05-10 NOTE — Progress Notes (Unsigned)
 MyChart message from Ms Jomarie (daughter and primary caretaker f Ms Car) that the [facility]  was not helpful with her mother's disruptive, nocturnal calling out behavior.  Will try Trazodone  50 to 100 mg at bedtime prn with recommendations with gradual delay of her mother's afternoon onset of her long-sleep with goal of brining it in line with her bedtime.   Unfortunately, there is no medication for calling out behavior.  I offered to make a referral to a Dr Tasia if this pharmocological and non-pharmacological interventions do not help.

## 2024-05-13 DIAGNOSIS — K219 Gastro-esophageal reflux disease without esophagitis: Secondary | ICD-10-CM | POA: Diagnosis not present

## 2024-05-13 DIAGNOSIS — E785 Hyperlipidemia, unspecified: Secondary | ICD-10-CM | POA: Diagnosis not present

## 2024-05-13 DIAGNOSIS — Z7982 Long term (current) use of aspirin: Secondary | ICD-10-CM | POA: Diagnosis not present

## 2024-05-13 DIAGNOSIS — E119 Type 2 diabetes mellitus without complications: Secondary | ICD-10-CM | POA: Diagnosis not present

## 2024-05-13 DIAGNOSIS — H409 Unspecified glaucoma: Secondary | ICD-10-CM | POA: Diagnosis not present

## 2024-05-13 DIAGNOSIS — Z435 Encounter for attention to cystostomy: Secondary | ICD-10-CM | POA: Diagnosis not present

## 2024-05-13 DIAGNOSIS — Z7984 Long term (current) use of oral hypoglycemic drugs: Secondary | ICD-10-CM | POA: Diagnosis not present

## 2024-05-13 DIAGNOSIS — E039 Hypothyroidism, unspecified: Secondary | ICD-10-CM | POA: Diagnosis not present

## 2024-05-13 DIAGNOSIS — G9340 Encephalopathy, unspecified: Secondary | ICD-10-CM | POA: Diagnosis not present

## 2024-05-13 DIAGNOSIS — D649 Anemia, unspecified: Secondary | ICD-10-CM | POA: Diagnosis not present

## 2024-05-13 DIAGNOSIS — Z794 Long term (current) use of insulin: Secondary | ICD-10-CM | POA: Diagnosis not present

## 2024-05-13 DIAGNOSIS — N3281 Overactive bladder: Secondary | ICD-10-CM | POA: Diagnosis not present

## 2024-05-13 DIAGNOSIS — Z466 Encounter for fitting and adjustment of urinary device: Secondary | ICD-10-CM | POA: Diagnosis not present

## 2024-05-13 DIAGNOSIS — L89313 Pressure ulcer of right buttock, stage 3: Secondary | ICD-10-CM | POA: Diagnosis not present

## 2024-05-13 DIAGNOSIS — I10 Essential (primary) hypertension: Secondary | ICD-10-CM | POA: Diagnosis not present

## 2024-05-13 DIAGNOSIS — Z9181 History of falling: Secondary | ICD-10-CM | POA: Diagnosis not present

## 2024-05-14 ENCOUNTER — Telehealth: Payer: Self-pay

## 2024-05-14 NOTE — Telephone Encounter (Signed)
 Angeolina, RN [employer] calls nurse line requesting verbal orders for continued skilled nursing.   RN reports that patient was supposed to be discharged yesterday, however, had episode of hypotension and low oxygen saturation levels. EMS was called. BP of 80/50, SpO2 of 84%.   EMS provided with oxygen support for about 30 minutes, vitals normalized. Family declined EMS transfer to hospital.   Provided home health RN with verbal orders for visits on 9/26, 9/29, 10/2.  Called and spoke with patient's daughter regarding concern.   She reports that patient has been fine since episode yesterday. No difficulty breathing, shortness of breath, dizziness, fever, cough, congestion.   Recommended that patient receive follow up evaluation.   Scheduled in ATC tomorrow morning. ED precautions discussed.   Chiquita JAYSON English, RN

## 2024-05-15 ENCOUNTER — Ambulatory Visit: Admitting: Family Medicine

## 2024-05-15 VITALS — BP 124/73 | HR 83 | Ht 62.0 in

## 2024-05-15 DIAGNOSIS — R7981 Abnormal blood-gas level: Secondary | ICD-10-CM

## 2024-05-15 NOTE — Patient Instructions (Signed)
 1) Your vitals were stable as we monitored you here. Your exam was reassuring too. It is also very reassuring that she was feeling normal at the time they checked her vitals.   I would want you to seek emergency medical attention if she: - has trouble breathing - has chest pain - had a new fever - feels lightheaded or faints - is acting different or confused

## 2024-05-15 NOTE — Progress Notes (Signed)
    SUBJECTIVE:   CHIEF COMPLAINT / HPI:   CD is a 89yo F that pf abnormal vitals. - Son-in-law and daughter helps to provide history.  - Nurse came over yesterday, helped to change the catheter, then nurse noticed her BP and pulse ox was low (BP of 80/50, SpO2 of 84% ). Daughter thought pt looked at baseline, but they called EMS due to the vitals. EMS came, gave oxygen, they assessed her after 30 minutes and pt was at neurologic baseline and vitals were baseline. They decided not to go to hospital.  - Having regular appetite, ate a big breakfast this morning.  - No recent infections, fevers - Pt and daughter both report that pt was at her baseline this whole, did not have any symptoms or SOB during this.  - Daughter thinks the reading may have been from pts fingers being cold  - This has not happened before. She normally comes to clinic to get her cath changed, so it was different for her to get it changed at home.     PERTINENT  PMH / PSH: dementia, DDD, T2DM, HTN  OBJECTIVE:   BP 122/68   Pulse 82   Ht 5' 2 (1.575 m)   SpO2 96%   BMI 26.61 kg/m   General: Alert, pleasantly demented older woman sitting in wheelchair. NAD. HEENT: NCAT. MMM. CV: RRR, no murmurs. Cap refill <2. Resp: CTAB, no wheezing or crackles. Normal WOB on RA.  Abm: Soft, nontender, nondistended. BS present Skin: Warm, well perfused   ASSESSMENT/PLAN:   Assessment & Plan Low O2 saturation Suspect this was a measurement error, potentially from cold finger tips. Less likely actual hypoxic episode given pt was at baseline, was asymptomatic. Vitals stable today x2 on RA, resp exam unremarkable. - Provided reassurance - Discussed return precautions    Twyla Nearing, MD [facility]

## 2024-05-17 DIAGNOSIS — D649 Anemia, unspecified: Secondary | ICD-10-CM | POA: Diagnosis not present

## 2024-05-17 DIAGNOSIS — E039 Hypothyroidism, unspecified: Secondary | ICD-10-CM | POA: Diagnosis not present

## 2024-05-17 DIAGNOSIS — Z435 Encounter for attention to cystostomy: Secondary | ICD-10-CM | POA: Diagnosis not present

## 2024-05-17 DIAGNOSIS — Z7984 Long term (current) use of oral hypoglycemic drugs: Secondary | ICD-10-CM | POA: Diagnosis not present

## 2024-05-17 DIAGNOSIS — I10 Essential (primary) hypertension: Secondary | ICD-10-CM | POA: Diagnosis not present

## 2024-05-17 DIAGNOSIS — K219 Gastro-esophageal reflux disease without esophagitis: Secondary | ICD-10-CM | POA: Diagnosis not present

## 2024-05-17 DIAGNOSIS — H409 Unspecified glaucoma: Secondary | ICD-10-CM | POA: Diagnosis not present

## 2024-05-17 DIAGNOSIS — Z794 Long term (current) use of insulin: Secondary | ICD-10-CM | POA: Diagnosis not present

## 2024-05-17 DIAGNOSIS — G9340 Encephalopathy, unspecified: Secondary | ICD-10-CM | POA: Diagnosis not present

## 2024-05-17 DIAGNOSIS — Z466 Encounter for fitting and adjustment of urinary device: Secondary | ICD-10-CM | POA: Diagnosis not present

## 2024-05-17 DIAGNOSIS — E785 Hyperlipidemia, unspecified: Secondary | ICD-10-CM | POA: Diagnosis not present

## 2024-05-17 DIAGNOSIS — L89313 Pressure ulcer of right buttock, stage 3: Secondary | ICD-10-CM | POA: Diagnosis not present

## 2024-05-17 DIAGNOSIS — Z9181 History of falling: Secondary | ICD-10-CM | POA: Diagnosis not present

## 2024-05-17 DIAGNOSIS — E119 Type 2 diabetes mellitus without complications: Secondary | ICD-10-CM | POA: Diagnosis not present

## 2024-05-17 DIAGNOSIS — Z7982 Long term (current) use of aspirin: Secondary | ICD-10-CM | POA: Diagnosis not present

## 2024-05-17 DIAGNOSIS — N3281 Overactive bladder: Secondary | ICD-10-CM | POA: Diagnosis not present

## 2024-05-20 DIAGNOSIS — K219 Gastro-esophageal reflux disease without esophagitis: Secondary | ICD-10-CM | POA: Diagnosis not present

## 2024-05-20 DIAGNOSIS — Z435 Encounter for attention to cystostomy: Secondary | ICD-10-CM | POA: Diagnosis not present

## 2024-05-20 DIAGNOSIS — I10 Essential (primary) hypertension: Secondary | ICD-10-CM | POA: Diagnosis not present

## 2024-05-20 DIAGNOSIS — E119 Type 2 diabetes mellitus without complications: Secondary | ICD-10-CM | POA: Diagnosis not present

## 2024-05-20 DIAGNOSIS — Z794 Long term (current) use of insulin: Secondary | ICD-10-CM | POA: Diagnosis not present

## 2024-05-20 DIAGNOSIS — G9340 Encephalopathy, unspecified: Secondary | ICD-10-CM | POA: Diagnosis not present

## 2024-05-20 DIAGNOSIS — Z466 Encounter for fitting and adjustment of urinary device: Secondary | ICD-10-CM | POA: Diagnosis not present

## 2024-05-20 DIAGNOSIS — H409 Unspecified glaucoma: Secondary | ICD-10-CM | POA: Diagnosis not present

## 2024-05-20 DIAGNOSIS — Z7984 Long term (current) use of oral hypoglycemic drugs: Secondary | ICD-10-CM | POA: Diagnosis not present

## 2024-05-20 DIAGNOSIS — L89313 Pressure ulcer of right buttock, stage 3: Secondary | ICD-10-CM | POA: Diagnosis not present

## 2024-05-20 DIAGNOSIS — E039 Hypothyroidism, unspecified: Secondary | ICD-10-CM | POA: Diagnosis not present

## 2024-05-20 DIAGNOSIS — Z7982 Long term (current) use of aspirin: Secondary | ICD-10-CM | POA: Diagnosis not present

## 2024-05-20 DIAGNOSIS — E785 Hyperlipidemia, unspecified: Secondary | ICD-10-CM | POA: Diagnosis not present

## 2024-05-20 DIAGNOSIS — D649 Anemia, unspecified: Secondary | ICD-10-CM | POA: Diagnosis not present

## 2024-05-20 DIAGNOSIS — N3281 Overactive bladder: Secondary | ICD-10-CM | POA: Diagnosis not present

## 2024-05-20 DIAGNOSIS — Z9181 History of falling: Secondary | ICD-10-CM | POA: Diagnosis not present

## 2024-05-22 ENCOUNTER — Ambulatory Visit (INDEPENDENT_AMBULATORY_CARE_PROVIDER_SITE_OTHER): Admitting: Internal Medicine

## 2024-05-22 ENCOUNTER — Encounter: Payer: Self-pay | Admitting: Internal Medicine

## 2024-05-22 VITALS — BP 120/76 | HR 94 | Ht 62.0 in

## 2024-05-22 DIAGNOSIS — E1140 Type 2 diabetes mellitus with diabetic neuropathy, unspecified: Secondary | ICD-10-CM | POA: Diagnosis not present

## 2024-05-22 DIAGNOSIS — Z794 Long term (current) use of insulin: Secondary | ICD-10-CM

## 2024-05-22 DIAGNOSIS — E039 Hypothyroidism, unspecified: Secondary | ICD-10-CM

## 2024-05-22 MED ORDER — LEVOTHYROXINE SODIUM 50 MCG PO TABS: 50.0000 ug | ORAL_TABLET | Freq: Every day | ORAL | 3 refills | Status: DC

## 2024-05-22 MED ORDER — METFORMIN HCL ER 500 MG PO TB24: 1000.0000 mg | ORAL_TABLET | Freq: Every day | ORAL | 3 refills | Status: DC

## 2024-05-22 MED ORDER — LANTUS SOLOSTAR 100 UNIT/ML ~~LOC~~ SOPN: 6.0000 [IU] | PEN_INJECTOR | Freq: Every day | SUBCUTANEOUS | Status: DC

## 2024-05-22 NOTE — Patient Instructions (Signed)
-   Decrease Lantus  6 units daily  - Continue Metformin  500 mg ,2 tablets with breakfast    HOW TO TREAT LOW BLOOD SUGARS (Blood sugar LESS THAN 70 MG/DL) Please follow the RULE OF 15 for the treatment of hypoglycemia treatment (when your (blood sugars are less than 70 mg/dL)   STEP 1: Take 15 grams of carbohydrates when your blood sugar is low, which includes:  3-4 GLUCOSE TABS  OR 3-4 OZ OF JUICE OR REGULAR SODA OR ONE TUBE OF GLUCOSE GEL    STEP 2: RECHECK blood sugar in 15 MINUTES STEP 3: If your blood sugar is still low at the 15 minute recheck --> then, go back to STEP 1 and treat AGAIN with another 15 grams of carbohydrates.

## 2024-05-22 NOTE — Progress Notes (Signed)
 Name: Hayley Fisher  Age/ Sex: 89 y.o., female   MRN/ DOB: [id number], Apr 25, 1931     PCP: Hayley Raguel MATSU, DO   Reason for Endocrinology Evaluation: Type 2 Diabetes Mellitus  Initial Endocrine Consultative Visit: 09/30/2020    PATIENT IDENTIFIER: [name] is a 89 y.o. female with a past medical history of T2DM, Hx DVT, DJD, Hypothyroidism. The patient has followed with Endocrinology clinic since 09/30/2020 for consultative assistance with management of her diabetes.  DIABETIC HISTORY:  Hayley Fisher was diagnosed with DM 2000. Her hemoglobin A1c has ranged from 6.3% in 2022, peaking at 10.2% in 2021.   Rybelsus  was discontinued 10/2022 due to nausea  SUBJECTIVE:   During the last visit (10/31/2023): A1c 6.9%     Today (05/22/2024): Hayley Fisher is here for follow-up on diabetes management.  She is accompanied by daughter Hayley Fisher. She checks her blood sugars 1 times daily. The patient has had hypoglycemic episodes since the last clinic visit, she was symptomatic at the time  The patient presented to the ED on 04/03/2024 due to hypoglycemia with decreased p.o. intake, patient did not meet SIRS criteria, no UTI.  Palliative care was recommended  She did have a follow-up with podiatry She has a good appetite  No nausea or vomiting  No constipation or diarrhea  Dementia is worsening , pt screams at night , she is found with a genetic specialist who has made medication adjustments.    HOME DIABETES REGIMEN:  Lantus  8 units daily  Metformin  500 mg ,2 tabs daily      METER DOWNLOAD SUMMARY: unable to download 90-day average 190 MGs/DL   This a.m. 729  [phone]   DIABETIC COMPLICATIONS: Microvascular complications:  Neuropathy  Denies: CKD  Last Eye Exam: Completed 08/31/2022  Macrovascular complications:   Denies: CAD, CVA, PVD   HISTORY:  Past Medical History:  Past Medical History:  Diagnosis Date   Arthritis    Cataract    bil cateracts removed   Chronic  kidney disease    spot on one of my kidneys per pt   Diabetes mellitus    Diarrhea 02/20/2022   Gastroesophageal reflux disease without esophagitis 09/16/2020   Glaucoma    Goals of care, counseling/discussion 09/09/2021   Hyperkalemia 07/02/2020   Hyperlipidemia    Hyperplastic colon polyp    Hypertension    Hypothyroidism 12/09/2020   Leukocytopenia 06/27/2021   Malnutrition of moderate degree 02/23/2021   Metabolic acidosis 07/02/2020   S/P IVC filter 01/16/2021   DVT 12/2020   Syncope    Thyroid  disease    Urge incontinence 05/05/2022   Past Surgical History:  Past Surgical History:  Procedure Laterality Date   ABDOMINAL HYSTERECTOMY     BLADDER SUSPENSION     bladder tacking     COLONOSCOPY     EYE SURGERY     INGUINAL HERNIA REPAIR Right    IR CATHETER TUBE CHANGE  09/15/2022   IR IVC FILTER PLMT / S&I /IMG GUID/MOD SED  01/16/2021   IR US  GUIDE BX ASP/DRAIN  08/02/2022   Social History:  reports that she quit smoking about 43 years ago. Her smoking use included cigarettes. She has been exposed to tobacco smoke. She has never used smokeless tobacco. She reports that she does not drink alcohol  and does not use drugs. Family History:  Family History  Problem Relation Age of Onset   Colon cancer Mother    Other Father        cerebral  hemorrhage   Diabetes Brother    Diabetes Sister        x 2   Bone cancer Daughter    Breast cancer Daughter 49   Esophageal cancer Neg Hx    Rectal cancer Neg Hx    Stomach cancer Neg Hx      HOME MEDICATIONS: Allergies as of 05/22/2024       Reactions   Doxepin  Hcl Other (See Comments)   Hallucinations increased   Rexulti  [brexpiprazole ] Other (See Comments)   Increase agitation   Levemir  [insulin  Detemir] Itching   Tramadol  Itching, Other (See Comments)   Takes occasionally with Benadryl         Medication List        Accurate as of May 22, 2024  2:13 PM. If you have any questions, ask your nurse or doctor.           acetaminophen  500 MG tablet Commonly known as: TYLENOL  Take 2 tablets (1,000 mg total) by mouth every 8 (eight) hours.   amLODipine  5 MG tablet Commonly known as: NORVASC  Take 1 tablet (5 mg total) by mouth daily.   ascorbic acid  500 MG tablet Commonly known as: VITAMIN C  Take 1 tablet (500 mg total) by mouth 2 (two) times daily.   atorvastatin  40 MG tablet Commonly known as: LIPITOR Take 1 tablet (40 mg total) by mouth daily.   brimonidine  0.2 % ophthalmic solution Commonly known as: ALPHAGAN  Place 1 drop into the left eye 2 (two) times daily.   diclofenac  Sodium 1 % Gel Commonly known as: Voltaren  Apply 2 g topically 4 (four) times daily. Apply over the affected area.  Please avoid the genital areas.   dorzolamide -timolol  2-0.5 % ophthalmic solution Commonly known as: COSOPT  Place 1 drop into both eyes 2 (two) times daily.   DuoDERM CGF Extra Thin Misc Apply 1 each topically every three (3) days as needed (Change every 3-5 days, or sooner if soiled.).   famotidine  20 MG tablet Commonly known as: PEPCID  Take 20 mg by mouth daily.   Lantus  SoloStar 100 UNIT/ML Solostar Pen Generic drug: insulin  glargine Inject 8 Units into the skin daily.   levothyroxine  50 MCG tablet Commonly known as: SYNTHROID  Take 1 tablet (50 mcg total) by mouth daily.   metFORMIN  500 MG 24 hr tablet Commonly known as: GLUCOPHAGE -XR Take 2 tablets (1,000 mg total) by mouth daily with breakfast.   metoprolol  succinate 25 MG 24 hr tablet Commonly known as: TOPROL -XL Take 1 tablet (25 mg total) by mouth daily.   polyethylene glycol powder 17 GM/SCOOP powder Commonly known as: GLYCOLAX /MIRALAX  Take 17 g by mouth in the morning and at bedtime. What changed: when to take this   senna 8.6 MG Tabs tablet Commonly known as: SENOKOT Take 1 tablet (8.6 mg total) by mouth 2 (two) times daily. What changed:  when to take this reasons to take this   traZODone  50 MG tablet Commonly  known as: DESYREL  Take 0.5-1 tablets (25-50 mg total) by mouth at bedtime as needed for sleep.         OBJECTIVE:   Vital Signs: BP 120/76 (BP Location: Left Arm, Patient Position: Sitting, Cuff Size: Large)   Pulse 94   Ht 5' 2 (1.575 m)   SpO2 90%   BMI 26.61 kg/m   Wt Readings from Last 3 Encounters:  05/02/24 145 lb 8 oz (66 kg)  03/22/24 145 lb 8.1 oz (66 kg)  03/11/24 132 lb 4.4 oz (60 kg)  Exam: General: Pt in a wheelchair drowsy  Lungs: Clear with good BS bilat   Heart: RRR   Extremities: 1+ pretibial edema.    Dm Foot Exam 05/02/2024 per podiatry    DATA REVIEWED:  Lab Results  Component Value Date   HGBA1C 7.1 (H) 04/05/2024   HGBA1C 8.3 (H) 03/15/2024   HGBA1C 6.9 10/03/2023    Latest Reference Range & Units 04/04/24 04:50  Sodium 135 - 145 mmol/L 139  Potassium 3.5 - 5.1 mmol/L 3.7  Chloride 98 - 111 mmol/L 102  CO2 22 - 32 mmol/L 29  Glucose 70 - 99 mg/dL 94  BUN 8 - 23 mg/dL 8  Creatinine 9.55 - 8.99 mg/dL 9.46  Calcium  8.9 - 10.3 mg/dL 8.5 (L)  Anion gap 5 - 15  8  Alkaline Phosphatase 38 - 126 U/L 41  Albumin 3.5 - 5.0 g/dL 2.9 (L)  AST 15 - 41 U/L 23  ALT 0 - 44 U/L 11  Total Protein 6.5 - 8.1 g/dL 5.4 (L)  Total Bilirubin 0.0 - 1.2 mg/dL 0.6  GFR, Estimated >39 mL/min >60  (L): Data is abnormally low  Old records , labs and images have been reviewed.   ASSESSMENT / PLAN / RECOMMENDATIONS:   1) Type 2 Diabetes Mellitus, Optimally  controlled, With Neuropathic  complications - Most recent A1c of 7.1%. Goal A1c < 8.0 %.    -The patient did have assisted hypoglycemia episode during service. -During hospitalization they attempted to wean her off the insulin , but unfortunately she developed severe hyperglycemia -The goal is to prevent hypoglycemia and severe hyperglycemia -Intolerant to Rybelsus  due to nausea -Not a candidate for SGLT2 inhibitors due to suprapubic catheter and recent UTI -Not a candidate for pioglitazone due to  lower extremity edema -Intolerant to higher doses of metformin  due to nausea - I will decrease her insulin  as below  MEDICATIONS: Continue  Metformin  500 mg , 2 tabs with breakfast  Decrease Lantus  6 units daily   EDUCATION / INSTRUCTIONS: BG monitoring instructions: Patient is instructed to check her blood sugars 1 times a day, fasting. Call [facility] if: BG persistently < 70  I reviewed the Rule of 15 for the treatment of hypoglycemia in detail with the patient. Literature supplied.   2) Diabetic complications:  Eye: Does not have known diabetic retinopathy.  Neuro/ Feet: Does  have known diabetic peripheral neuropathy .  Renal: Patient does not have known baseline CKD. She   is  on an ACEI/ARB at present.    3) Hypothyroidism:  -Patient with worsening dementia which attributes to a lot of her symptoms - TSH normal in July, 2025 - No change   Medication  Levothyroxine  50 mcg daily     F/U in 4 months   Signed electronically by: Stefano Redgie Butts, MD  [facility] Endocrinology  [facility] [address] [place] [place], [place] [address] Phone: [phone] FAX: [phone]   CC: Hayley Raguel MATSU, DO [address] [place] [address] Phone: [phone]  Fax: [phone]  Return to Endocrinology clinic as below: Future Appointments  Date Time Provider Department Center  06/06/2024  3:15 PM Hayley Fisher [facility]  09/02/2024  4:15 PM Loreda Hacker, DPM [facility]  10/28/2024  1:40 PM [facility] ANNUAL WELLNESS VISIT [facility]

## 2024-05-27 ENCOUNTER — Encounter: Payer: Self-pay | Admitting: Family Medicine

## 2024-05-28 ENCOUNTER — Other Ambulatory Visit: Payer: Self-pay

## 2024-05-28 DIAGNOSIS — I10 Essential (primary) hypertension: Secondary | ICD-10-CM

## 2024-05-28 MED ORDER — AMLODIPINE BESYLATE 5 MG PO TABS: 5.0000 mg | ORAL_TABLET | Freq: Every day | ORAL | 3 refills | Status: DC

## 2024-05-30 ENCOUNTER — Other Ambulatory Visit: Payer: Self-pay

## 2024-06-05 ENCOUNTER — Encounter: Payer: Self-pay | Admitting: Family Medicine

## 2024-06-06 ENCOUNTER — Ambulatory Visit (INDEPENDENT_AMBULATORY_CARE_PROVIDER_SITE_OTHER)

## 2024-06-06 VITALS — BP 161/94 | HR 86 | Ht 62.0 in

## 2024-06-06 DIAGNOSIS — F99 Mental disorder, not otherwise specified: Secondary | ICD-10-CM | POA: Diagnosis not present

## 2024-06-06 DIAGNOSIS — F5105 Insomnia due to other mental disorder: Secondary | ICD-10-CM

## 2024-06-06 DIAGNOSIS — Z978 Presence of other specified devices: Secondary | ICD-10-CM

## 2024-06-06 MED ORDER — TRAZODONE HCL 100 MG PO TABS: 100.0000 mg | ORAL_TABLET | Freq: Every evening | ORAL | 0 refills | Status: DC | PRN

## 2024-06-06 NOTE — Patient Instructions (Addendum)
 Today I put in for Trazadone 100 mg. I also put in a home health referral for the catheter change. Dr. McDiarmid will take care of the wheelchair.

## 2024-06-06 NOTE — Progress Notes (Signed)
    SUBJECTIVE:   CHIEF COMPLAINT / HPI: Sleep disturbance  Ms. Mozingo is accompanied by her daughter.  She states she is doing well today.  Her daughter is concerned because her mother does not sleep well throughout the night.  She states that she falls asleep easily but will wake up in the middle of the night calling out.  This has been an ongoing issue for Ms. Rodenbaugh.  Her daughter was given trazodone  at a previous visit.  After messaging with me she went up to 75 mg, and eventually tried 100 mg.  This seemed to work well for Ms. Bessire, but she was still having some wakings during the night.  However, it was not making her sedated.  Her daughter is also asking about a specific wheelchair for the patient that was recommended by Dr. Keneth.  Since family has already been in contact with Dr. McDiarmid for the wheelchair, I let them know that he would take care of this.  She also asks if there is any way Ms. Yazdani can get her Foley catheter changed at home.  They have a difficult time getting her to the doctor's office and having her lay down for the change.  PERTINENT  PMH / PSH: Sacral skin breakdown, indwelling Foley catheter, dementia  OBJECTIVE:   BP (!) 161/94   Pulse 86   Ht 5' 2 (1.575 m)   SpO2 96%   BMI 26.61 kg/m   General: Elderly female, well appearing, NAD Cardiovascular: RRR, no M/R/G Respiratory: CTAB, normal work of breathing on room air Extremities: 1+ pitting edema bilaterally Skin: Sacral skin breakdown as below    ASSESSMENT/PLAN:   Assessment & Plan Insomnia due to other mental disorder Last nighttime awakenings with trazodone  100 mg.  No morning sedation. - Ordered trazodone  100 mg Foley catheter in place Indwelling Foley catheter that requires changes monthly. - Home health referral for Foley catheter changes     Raguel KANDICE Lee, DO [facility]

## 2024-06-07 ENCOUNTER — Other Ambulatory Visit: Payer: Self-pay | Admitting: Family Medicine

## 2024-06-07 DIAGNOSIS — R262 Difficulty in walking, not elsewhere classified: Secondary | ICD-10-CM

## 2024-06-07 DIAGNOSIS — Z4689 Encounter for fitting and adjustment of other specified devices: Secondary | ICD-10-CM

## 2024-06-07 NOTE — Progress Notes (Signed)
 Order to [facility] for wheelchair assessment and fitting

## 2024-06-07 NOTE — Assessment & Plan Note (Signed)
 Last nighttime awakenings with trazodone  100 mg.  No morning sedation. - Ordered trazodone  100 mg

## 2024-06-07 NOTE — Telephone Encounter (Signed)
 Ms Leiner will need a wheelchair assessment physical therapy at [facility].  I will put the referral in for the assessment.

## 2024-06-12 ENCOUNTER — Encounter: Payer: Self-pay | Admitting: Family Medicine

## 2024-06-12 NOTE — Telephone Encounter (Signed)
 Ronnald,  Consider a trial of mirtazapine 15 mg at bedtime, nightly.  This may help with mood and sleep.   You may want to offer, again, a referral for a consultation with a geriatric psychiatrist, Dr Elna Lo ([facility], [facility]).  He is good at managing the behavioral and psychological symptoms of dementia.   Krystal

## 2024-06-13 DIAGNOSIS — R338 Other retention of urine: Secondary | ICD-10-CM | POA: Diagnosis not present

## 2024-06-13 MED ORDER — MIRTAZAPINE 15 MG PO TABS: 15.0000 mg | ORAL_TABLET | Freq: Every day | ORAL | 0 refills | Status: DC

## 2024-06-14 NOTE — Progress Notes (Signed)
 Indwelling suprapubic catheter Managed by urology. I am unable to access their notes. However, it seems her catheter is in place due to chronic cystitis and urge incontinence. Along with this, the patient is of advanced age with dementia and wheelchair bound.   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

## 2024-06-18 ENCOUNTER — Encounter: Payer: Self-pay | Admitting: Physical Therapy

## 2024-06-18 ENCOUNTER — Ambulatory Visit: Attending: Family Medicine | Admitting: Physical Therapy

## 2024-06-18 DIAGNOSIS — R2689 Other abnormalities of gait and mobility: Secondary | ICD-10-CM | POA: Insufficient documentation

## 2024-06-18 DIAGNOSIS — R262 Difficulty in walking, not elsewhere classified: Secondary | ICD-10-CM | POA: Diagnosis not present

## 2024-06-18 DIAGNOSIS — R29898 Other symptoms and signs involving the musculoskeletal system: Secondary | ICD-10-CM | POA: Diagnosis present

## 2024-06-18 DIAGNOSIS — M6281 Muscle weakness (generalized): Secondary | ICD-10-CM | POA: Diagnosis present

## 2024-06-18 DIAGNOSIS — R29818 Other symptoms and signs involving the nervous system: Secondary | ICD-10-CM | POA: Insufficient documentation

## 2024-06-18 DIAGNOSIS — R293 Abnormal posture: Secondary | ICD-10-CM | POA: Diagnosis present

## 2024-06-18 DIAGNOSIS — Z4689 Encounter for fitting and adjustment of other specified devices: Secondary | ICD-10-CM | POA: Diagnosis not present

## 2024-06-18 NOTE — Therapy (Signed)
 OUTPATIENT PHYSICAL THERAPY WHEELCHAIR EVALUATION   Patient Name: Hayley Fisher MRN: [id number] DOB:11-18-1930, 89 y.o., female Today's Date: 06/18/2024  END OF SESSION:  PT End of Session - 06/18/24 0919     Visit Number 1    Number of Visits 1    Authorization Type [employer] Dual Complete    PT Start Time 0800    PT Stop Time 0902    PT Time Calculation (min) 62 min    Activity Tolerance Patient tolerated treatment well    Behavior During Therapy WFL for tasks assessed/performed          Past Medical History:  Diagnosis Date   Arthritis    Cataract    bil cateracts removed   Chronic kidney disease    spot on one of my kidneys per pt   Diabetes mellitus    Diarrhea 02/20/2022   Gastroesophageal reflux disease without esophagitis 09/16/2020   Glaucoma    Goals of care, counseling/discussion 09/09/2021   Hyperkalemia 07/02/2020   Hyperlipidemia    Hyperplastic colon polyp    Hypertension    Hypothyroidism 12/09/2020   Leukocytopenia 06/27/2021   Malnutrition of moderate degree 02/23/2021   Metabolic acidosis 07/02/2020   S/P IVC filter 01/16/2021   DVT 12/2020   Syncope    Thyroid  disease    Urge incontinence 05/05/2022   Past Surgical History:  Procedure Laterality Date   ABDOMINAL HYSTERECTOMY     BLADDER SUSPENSION     bladder tacking     COLONOSCOPY     EYE SURGERY     INGUINAL HERNIA REPAIR Right    IR CATHETER TUBE CHANGE  09/15/2022   IR IVC FILTER PLMT / S&I /IMG GUID/MOD SED  01/16/2021   IR US  GUIDE BX ASP/DRAIN  08/02/2022   Patient Active Problem List   Diagnosis Date Noted   Caregiver burden 04/26/2024   Palliative care patient 04/26/2024   Pressure ulcer of left buttock, stage 2 (HCC) 04/25/2024   Pressure ulcer of right buttock, stage 3 (HCC) 04/05/2024   SIRS (systemic inflammatory response syndrome) (HCC) 04/04/2024   Type 2 diabetes mellitus with hypoglycemia (HCC) 04/04/2024   Failure to thrive in adult 04/04/2024   Behavioral and  psychological symptoms of dementia (HCC) 04/03/2024   Acute cystitis without hematuria 03/21/2024   Somnolence 03/19/2024   Anemia of unknown etiology 03/17/2024   Chronic health problem 03/15/2024   Sacral decubitus ulcer 03/15/2024   Hypoglycemia    Acute hypoxemic respiratory failure (HCC) 03/16/2023   Bilateral sensorineural hearing loss 09/07/2022   Weight loss, unintentional 05/06/2022   Diabetic neuropathy (HCC) 05/05/2022   Suprapubic catheter (HCC)    Polypharmacy 03/02/2022   Type 2 diabetes mellitus with hyperglycemia, with long-term current use of insulin  (HCC) 02/11/2022   History of DVT (deep vein thrombosis) 11/04/2021   Mood disorder 09/09/2021   Constipation 08/31/2021   Insomnia 08/31/2021   Unspecified dementia, severe, with other behavioral disturbance (HCC) 02/21/2021   Hypothyroidism 12/09/2020   Unstable gait 04/03/2018   Frequent falls 04/03/2018   Skin ulcer of sacrum, limited to breakdown of skin (HCC) 03/16/2017   Spinal stenosis of lumbar region 01/13/2016   Chronic venous insufficiency 11/09/2015   DDD (degenerative disc disease), lumbosacral 11/24/2014   Frequent UTI 10/29/2012   Onychomycosis 04/03/2009   Hyperlipidemia associated with type 2 diabetes mellitus (HCC) 10/19/2006   Glaucoma 10/19/2006   Hypertension associated with diabetes (HCC) 10/19/2006   Osteoarthritis, multiple sites 10/19/2006   Osteopenia 10/19/2006  PCP: Lennie Raguel MATSU., DO  REFERRING PROVIDER: McDiarmid, Krystal BIRCH, MD  REFERRING DIAG: Z46.89 (ICD-10-CM) - Wheelchair fitting or adjustment R26.2 (ICD-10-CM) - Ambulatory dysfunction  THERAPY DIAG:  Other abnormalities of gait and mobility  Abnormal posture  Other symptoms and signs involving the musculoskeletal system  Muscle weakness (generalized)  Other symptoms and signs involving the nervous system  ONSET DATE: Referral date 06-07-24  Rationale for Evaluation and Treatment: Habilitation  SUBJECTIVE:                                                                                                                                                                                            SUBJECTIVE STATEMENT: Pt presents to PT eval in a standard manual wheelchair; leaning toward Lt side; accompanied by daughter and son-in-law. Pt is dependent for propulsion (pt has dementia)  PERTINENT HISTORY:  T2DM, HTN, HLD, dementia, RA, lumbosacral DDD, diabetic neuropathy  PAIN: daughter reports pt has pain in her bottom with prolonged sitting - has had decubitus ulcer stage 3   PRECAUTIONS: Fall and Other: pt is nonambulatory - dependent for all mobility  WEIGHT BEARING RESTRICTIONS: No  FALLS:  Has patient fallen in last 6 months? No  LIVING ENVIRONMENT: Lives with: lives with their family Lives in: House/apartment Stairs: No Has following equipment at home: Wheelchair (manual), Ramped entry, and nurse, adult   OCCUPATION: retired  PLOF: Needs assistance with ADLs, Needs assistance with homemaking, Needs assistance with transfers, and Leisure: church activities  PATIENT GOALS: family's goal - obtain manual wheelchair with manual tilt for patient   PATIENT INFORMATION: This Evaluation form will serve as the LMN for the following suppliers:  Supplier:  [employer] Contact Person:  Sidra Bucker, ATP Phone:  [phone]   Reason for Referral: Patient/caregiver Goals: Patient was seen for face-to-face evaluation for new power wheelchair.  Also present was  Josh Cadle, ATP with [employer]  to discuss recommendations and wheelchair options.  Further paperwork was completed and sent to vendor.  Patient appears to qualify for power mobility device at this time per objective findings.   MEDICAL HISTORY: Diagnosis:  Dementia: h/o sacral decubitus stage 3; Rheumatoid arthritis, SIRS,  spinal stenosis of lumbar region Primary Diagnosis Onset:  2023 for dementia:   July 2025 for stage III sacral decubitus  ulcer [] Progressive Disease Relevant Past and Future Surgeries: Height:  5'2 Weight:  127# Explain and recent changes or trends in weight:   Relevant History including falls:  Pt diagnosed with mild dementia in 2023, has progressed to severe dementia per MD chart note.  Family reports no falls. Pt is dependent for all mobility -  requires total assist to stand/transfer; daughter states they have a hoyer lift at home but usually just perform total lift transfer for convenience      HOME ENVIRONMENT: [x] House  [] Condo/town home  [] Apartment  [] Assisted Living    [] Lives Alone [x]  Lives with Others                                                    Hours with caregiver: 24+  [] Home is accessible to patient            Stairs  [] Yes [x]  No     Ramp [x] Yes [] No Comments:  has aide from 8:00 - 4:00 Mon - Sun.    COMMUNITY ADL: TRANSPORTATION: [x] Car    [] Fleeta    [] Therapist, Music    [] Adapted w/c Lift   [] Ambulance   [] Other:       [] Sits in wheelchair during transport  Employment/School:     Specific requirements pertaining to mobility                                                     Other:                                       FUNCTIONAL/SENSORY PROCESSING SKILLS:  Handedness:   [] Right     [] Left    [x] NA  Comments:                                 Functional Processing Skills for Wheeled Mobility [] Processing Skills are adequate for safe wheelchair operation  Areas of concern than may interfere with safe operation of wheelchair Description of problem   []  Attention to environment     [] Judgment     []  Hearing  []  Vision or visual processing    [] Motor Planning  []  Fluctuations in Behavior                                                   VERBAL COMMUNICATION: [] WFL receptive []  WFL expressive [x] Understandable  [] Difficult to understand  [] non-communicative []  Uses an augmented communication device    CURRENT SEATING / MOBILITY: Current Mobility Base:    [] None  [] Dependent  [x] Manual  [] Scooter  [] Power   Type of Control:                       Manufacturer:                         Size:  16 x 16                      Age:   89 yrs old                         Current Condition of  Mobility Base:   current wheelchair is no longer appropriate due to pt's change in medical status, I.e. development of pressure ulcer and postural asymmetries                                                                                                              Current Wheelchair components:                                                                                                                                   Describe posture in present seating system: pt is leaning to left side - needs pillow under her Lt arm to provide support for upright sitting; pt is windswept to Lt side with Rt pelvis elevated                                                                            SENSATION and SKIN ISSUES: Sensation [x] Intact [] Impaired [] Absent   Level of sensation:                           Pressure Relief: Able to perform effective pressure relief :   [] Yes  [x]  No Method:                                                                              If not, Why?:  decreased trunk control and musc. Weakness; also due to decreased cognition due to dementia                                                                       Skin Issues/Skin Integrity  Current Skin Issues   [x] Yes [] No  [x] Intact []  Red area []  Open Area  [x] Scar Tissue [x] At risk from prolonged sitting  Where sacral region                             History of Skin Issues   [x] Yes [] No  Where   sacrum                                       When:  July 2025                                              Hx of skin flap surgeries [] Yes [x] No  Where                  When                                                  Limited sitting tolerance [x] Yes [] No Hours spent sitting  in wheelchair daily:   every 2 hours she is transferred from wheelchair to chair to bed for pressure relief for increased comfort                                                      Complaint of Pain:  Please describe:  pain with prolonged sitting due to h/o decubitus ulcer in sacral region/Rt hip area (per daughter's report)                                                                                                        Swelling/Edema:     in bil. Lower legs and feet                                                                                                                                          ADL STATUS (in reference to wheelchair use):  Indep  Assist Unable Indep with Equip Not assessed Comments  Dressing                          X                                 Dresses from bed             Eating                         X                               Pt is fed - unable to feed herself                                                                      Toileting                            X                                        Has suprapubic catheter and wears Depends                                                           Bathing                     X                                         Sponge baths                                                                       Grooming/ Hygiene                        X                                       Performs from wheelchair  Meal Prep                            X                                                                                              IADLS                          X                                                                                        Bowel Management: [] Continent  [x] Incontinent  [] Accidents Comments: wears adult diapers                                                 Bladder Management: [] Continent  [x] Incontinent   [] Accidents Comments: has suprapubic catheter                                              WHEELCHAIR SKILLS: Manual w/c Propulsion: [] UE or LE strength and endurance sufficient to participate in ADLs using manual wheelchair Arm :  [] left [] right  [] Both                                   Foot:   [] left [] right  [] Both  Distance:   Operate Scooter: []  Strength, hand grip, balance and transfer appropriate for use [] Living environment is accessible for use of scooter  Operate Power w/c:  []  Std. Joystick   []  Alternative Controls Indep []  Assist []  Dependent/ Unable []  N/A [x]  [] Safe          []  Functional      Distance:                Bed confined without wheelchair [x]  Yes []  No   STRENGTH/RANGE OF MOTION:  Active Range of Motion Strength  Shoulder    Bil. Shoulder flexion approx. 90 degrees                                                    Grossly 3-/5 bil. Shoulder musc.  Elbow     Bil. Elbow extension approx. -30 degrees                                                WFL's bil. Elbow flexors and extensors                                                          Wrist/Hand       Fingers flexed due to RA                                                             2-/5 fingers/hand due to contractures due to RA                                                                     Hip      Minimal active hip flexion  in seated position                                                           2+ - 3-/5 bil. Hip flexors                                                          Knee        Bil. Knee ROM WFL's                                                        Grossly                                                                Ankle Minimal AROM bil. Ankles     Grossly 2-/5 bil. ankles  MOBILITY/BALANCE:  [x]  Patient is totally dependent for mobility                                                                                                Balance Transfers Ambulation  Sitting Balance: Standing Balance: []  Independent []  Independent/Modified Independent  []  WFL     []  WFL []  Supervision []  Supervision  []  Uses UE for balance  []  Supervision []  Min Assist []  Ambulates with Assist                           []  Min Assist []  Min assist []  Mod Assist []  Ambulates with Device:  []  RW   []  StW   []  Cane   []                 [x]  Mod Assist []  Mod assist []  Max assist   []  Max Assist [x]  Max assist [x]  Dependent []  Indep. Short Distance Only  []  Unable []  Unable [x]  Lift / Sling Required Distance (in feet)                             []  Sliding board [x]  Unable to Ambulate: (Explain:  Cardio Status:  [x] Intact  []  Impaired   []  NA                              Respiratory Status:  [x] Intact   [] Impaired   [] NA                                     Orthotics/Prosthetics:                                                                         Comments (Address manual vs power w/c vs scooter):  Pt is dependent for all mobility and ADL's due to multiple medical co-morbidities including severe dementia and RA.  Pt is currently using a standard wheelchair for mobility but is dependent for propulsion.  This wheelchair is no longer appropriate for her due to change in her status including development of sacral decubitus in July 2025 and also due to poor postural control as she leans to the left side in her current wheelchair.  She is at high risk for additional skin breakdown due to her inability to perform pressure relief.  A manual tilt in space wheelchair with postural supports is medically necessary for her caregiver to perform effective  pressure relief while she is seated in the wheelchair.  She needs trunk supports due to her inability to sit upright due to decreased trunk control and trunk musc. strenth.  She  also needs thigh supports for pelvic positioning due to her hips  being windswept to the left.  A manual tilt in space wheelchair will maximize comfort with prolonged sitting, promote optimal sitting posture with postural supports and assist in maintaining skin integrity by enabling caregiver to perform adequate pressure relief for this patient while she is seated in the wheelchair.                                            Anterior / Posterior Obliquity Rotation-Pelvis  PELVIS    [] Neutral  [x]  Posterior  []  Anterior     [] WFL  [x] Right Elevated  [] Left Elevated   [x] WFL  [] Right Anterior []   Left Anterior    []  Fixed [x]  Partly Flexible []  Flexible  []  Other  []  Fixed  [x]  Partly Flexible  []  Flexible []  Other  []  Fixed  []  Partly Flexible  [x]  Flexible []  Other  TRUNK [] WFL [x] Thoracic Kyphosis [] Lumbar Lordosis   []  WFL [x] Convex Right [] Convex Left   [] c-curve [] s-curve [] multiple  [x]  Neutral []  Left-anterior []  Right-anterior    []  Fixed []  Flexible [x]  Partly Flexible       Other  []  Fixed []  Flexible [x]  Partly Flexible []  Other  []  Fixed           [x]  Flexible []  Partly Flexible []  Other   Position Windswept   HIPS  []  Neutral []  Abduct [x]  ADduct []  Neutral []  Right [x]  Left       []  Fixed  [x]  Partly Flexible             []  Dislocated []  Flexible []  Subluxed    []  Fixed [x]  Partly Flexible  []  Flexible []  Other              Foot Positioning Knee Positioning   Knees and  Feet  [x]  WFL [x] Left [x] Right [x]  WFL [x] Left [x] Right   KNEES ROM concerns: ROM concerns:   & Dorsi-Flexed                    [] Lt [] Rt                                  FEET Plantar Flexed                  [] Lt [] Rt     Inversion                    [] Lt [] Rt     Eversion                    [] Lt [] Rt    HEAD []  Functional []  Good Head Control   & [x]  Flexed         []  Extended [x]  Adequate Head Control   NECK []  Rotated  Lt  []  Lat Flexed Lt []  Rotated  Rt []  Lat Flexed Rt []  Limited Head Control    []   Cervical Hyperextension []  Absent  Head Control    SHOULDERS ELBOWS WRIST& HAND         Left     Right    Left     Right  U/E [x] Functional  Left            [x] Functional  Right                                 [x] Fisting             [x] Fisting     [] elevated Left [] depressed  Left [] elevated Right [] depressed  Right   Fingers flexed due to RA   [] protracted Left [] retracted Left [] protracted Right [] retracted Right [] subluxed  Left              [] subluxed  Right         Goals for Wheelchair Mobility  []  Independence with mobility in the home with motor related ADLs (MRADLs)  []  Independence with MRADLs in the community [x]  Provide dependent mobility  []  Provide recline     [x] Provide tilt   Goals for Seating system [x]  Optimize pressure distribution [x]  Provide support needed to facilitate function or safety [x]  Provide corrective forces to assist with maintaining or improving posture [x]  Accommodate client's posture: current seated postures and positions are not flexible or will not tolerate corrective forces []  Client to be independent with relieving pressure in the wheelchair [] Enhance physiological function such as breathing, swallowing, digestion  Simulation ideas/Equipment trials:                                                                                                State why other equipment was unsuccessful:                                                                           MOBILITY BASE RECOMMENDATIONS and JUSTIFICATION: MOBILITY COMPONENT JUSTIFICATION  Manufacturer: [employer]          Model:  Focus CR            Size: Width 16          Seat Depth  18           [x] provide transport from point A to B [] promote Indep mobility  [x] is not a safe, functional ambulator [x] walker or cane inadequate [] non-standard width/depth necessary to accommodate anatomical measurement []                             [x] Manual Mobility Base [x] non-functional ambulator     [] Scooter/POV  [] can safely operate  [] can safely transfer   [] has adequate trunk stability  [] cannot functionally propel manual w/c  [] Power Mobility Base  [] non-ambulatory  [] cannot functionally propel manual wheelchair  []  cannot functionally and safely operate scooter/POV [] can safely operate and willing to  [] Stroller Base [] infant/child  [] unable to propel manual wheelchair [] allows for growth [] non-functional ambulator [] non-functional UE [] Indep mobility is not a goal at this time  [x] Tilt  [] Forward                   [  x]Backward                  [] Powered tilt              [x] Manual tilt  [x] change position against gravitational force on head and shoulders  [x] change position for pressure relief/cannot weight shift [x] transfers  [] management of tone [x] rest periods [x] control edema [x] facilitate postural control  []                                       [] Recline  [] Power recline on power base [] Manual recline on manual base  [] accommodate femur to back angle  [] bring to full recline for ADL care  [] change position for pressure relief/cannot weight shift [] rest periods [] repositioning for transfers or clothing/diaper /catheter changes [] head positioning  [] Lighter weight required [] self- propulsion  [] lifting []                                                 [] Heavy Duty required [] user weight greater than 250# [] extreme tone/ over active movement [] broken frame on previous chair []                                     [x]  Back  []  Angle Adjustable []  Custom molded      Acta Relief positioning                     [x] postural control [] control of tone/spasticity [] accommodation of range of motion [] UE functional control [] accommodation for seating system []                                          [x] provide lateral trunk support [] accommodate deformity [x] provide posterior trunk support [x] provide lumbar/sacral support [x] support trunk in midline [x] Pressure  relief over spinal processes  [x]  Seat Cushion   Roho Hybrid Select - adjustable skin protection & positioning                  [] impaired sensation  [] decubitus ulcers present [x] history of pressure ulceration [x] prevent pelvic extension (recently healed)  [] low maintenance  [] stabilize pelvis  [x] accommodate obliquity [] accommodate multiple deformity [x] neutralize lower extremity position [x] increase pressure distribution [x]  At risk with prolonged sitting                                       [x]  Pelvic/thigh support  []  Lateral thigh guide [x]  Distal medial pad  []  Distal lateral pad [x]  pelvis in neutral [] accommodate pelvis [x]  position upper legs [x]  alignment []  accommodate ROM []  decrease adduction [] accommodate tone [x] removable for transfers [] decrease abduction  [x]  Lateral trunk Supports [x]  Lt     [x]  Rt [x] decrease lateral trunk leaning [] control tone [] contour for increased contact [x] safety  [] accommodate asymmetry []                                                 [  x] Mounting hardware  [x] lateral trunk supports  [] back   [] seat [x] headrest      [x]  thigh support [] fixed   [x] swing away [] attach seat platform/cushion to w/c frame [] attach back cushion to w/c frame [x] mount postural supports [x] mount headrest  [x] swing medial thigh support away [x] swing lateral supports away for transfers  []                                                     Armrests  [] fixed [x] adjustable height [x] removable   [] swing away  [] flip back   [] reclining [] full length pads [x] desk    [] pads tubular  [x] provide support with elbow at 90   [] provide support for w/c tray [x] change of height/angles for variable activities [x] remove for transfers [] allow to come closer to table top [x] remove for access to tables []                                               Hangers/ Leg rests  [] 60 [x] 70 [] 90 [] elevating [] heavy duty  [] articulating [] fixed [x] lift off [x] swing away      [] power [x] provide LE support  [] accommodate to hamstring tightness [] elevate legs during recline   [x] provide change in position for Legs [] Maintain placement of feet on footplate [] durability [x] enable transfers [x] decrease edema [] Accommodate lower leg length []                                         Foot support Footplate    [x] Lt  [x]  Rt  []  Center mount [x] flip up                            [x] depth/angle adjustable [] Amputee adapter    []  Lt     []  Rt [x] provide foot support [x] accommodate to ankle ROM [x] transfers [] Provide support for residual extremity []  allow foot to go under wheelchair base []  decrease tone  []                                                 [x]  Ankle strap/heel loops and calf panel [x] support foot on foot support [] decrease extraneous movement [] provide input to heel  [x] protect foot and secure lower extremities in position  Tires: [] pneumatic  [x] flat free inserts  [] solid  [x] decrease maintenance  [x] prevent frequent flats [] increase shock absorbency [] decrease pain from road shock [] decrease spasms from road shock []                                              [x]  Headrest  [x] provide posterior head support [x] provide posterior neck support [] provide lateral head support [] provide anterior head support [x] support during tilt and recline [] improve feeding   [] improve respiration [] placement of switches [x] safety  [] accommodate ROM  [] accommodate tone [] improve visual orientation  []  Anterior chest strap []  Vest []  Shoulder retractors  [] decrease  forward movement of shoulder [] accommodation of TLSO [] decrease forward movement of trunk [] decrease shoulder elevation [] added abdominal support [] alignment [] assistance with shoulder control  []                                               Pelvic Positioner [x] Belt [] SubASIS bar [] Dual Pull [] stabilize tone [x] decrease falling out of chair/ **will not Decrease potential for sliding  due to pelvic tilting [] prevent excessive rotation [] pad for protection over boney prominence [] prominence comfort [] special pull angle to control rotation []                                                  Upper ExtremitySupport  [] L   []  R [] Arm trough   [] hand support []  tray       [] full tray [] swivel mount [] decrease edema      [] decrease subluxation   [] control tone   [] placement for AAC/Computer/EADL [] decrease gravitational pull on shoulders [] provide midline positioning [] provide support to increase UE function [] provide hand support in natural position [] provide work surface   POWER WHEELCHAIR CONTROLS  [] Proportional  [] Non-Proportional Type                                      [] Left  [] Right [] provides access for controlling wheelchair   [] lacks motor control to operate proportional drive control [] unable to understand proportional controls  Actuator Control Module  [] Single  [] Multiple   [] Allow the client to operate the power seat function(s) through the joystick control   [] Safety Reset Switches [] Used to change modes and stop the wheelchair when driving in latch mode    [] Upgraded Electronics   [] programming for accurate control [] progressive Disease/changing condition [] non-proportional drive control needed [] Needed in order to operate power seat functions through joystick control   [] Display box [] Allows user to see in which mode and drive the wheelchair is set  [] necessary for alternate controls    [] Digital interface electronics [] Allows w/c to operate when using alternative drive controls  [] ASL Head Array [] Allows client to operate wheelchair  through switches placed in tri-panel headrest  [] Sip and puff with tubing kit [] needed to operate sip and puff drive controls  [] Upgraded tracking electronics [] increase safety when driving [] correct tracking when on uneven surfaces  [] Mount for switches or joystick [] Attaches switches to w/c  [] Swing away for  access or transfers [] midline for optimal placement [] provides for consistent access  [] Attendant controlled joystick plus mount [] safety [] long distance driving [] operation of seat functions [] compliance with transportation regulations []                                             Rear wheel placement/Axle adjustability [] None [x] semi adjustable [] fully adjustable  [] improved UE access to wheels [x] improved stability [] changing angle in space for improvement of postural stability [] 1-arm drive access [] amputee pad placement []   Wheel rims/ hand rims  [] metal   [] plastic coated [] oblique projections           [] vertical projections [] Provide ability to propel manual wheelchair  []  Increase self-propulsion with hand weakness/decreased grasp  Push handles [] extended   [] angle adjustable              [] standard [] caregiver access [] caregiver assist [] allows "hooking" to enable increased ability to perform ADLs or maintain balance  One armed device   [] Lt   [] Rt [] enable propulsion of manual wheelchair with one arm   []                                            Brake/wheel lock extension []  Lt   []  Rt [] increase indep in applying wheel locks   [] Side guards [] prevent clothing getting caught in wheel or becoming soiled []  prevent skin tears/abrasions  Battery:                                            [] to power wheelchair                                                         Other:   Antitippers                                                                                                                     The above equipment has a life- long use expectancy. Growth and changes in medical and/or functional conditions would be the exceptions. This is to certify that the therapist has no financial relationship with durable medical provider or manufacturer. The therapist will not receive remuneration of any kind for the equipment recommended in this  evaluation.   Patient has mobility limitation that significantly impairs safe, timely participation in one or more mobility related ADL's. (bathing, toileting, feeding, dressing, grooming, moving from room to room)  [x]  Yes []  No  Will mobility device sufficiently improve ability to participate and/or be aided in participation of MRADL's?      [x]  Yes []  No  Can limitation be compensated for with use of a cane or walker?                                    []  Yes [x]  No  Does patient or caregiver demonstrate ability/potential ability & willingness to safely use the mobility device?    [x]  Yes []  No  Does patient's home environment support use of recommended mobility device?            [  x] Yes []  No  Does patient have sufficient upper extremity function necessary to functionally propel a manual wheelchair?     []  Yes [x]  No - for dependent mobility  Does patient have sufficient strength and trunk stability to safely operate a POV (scooter)?                                  []  Yes [x]  No  Does patient need additional features/benefits provided by a power wheelchair for MRADL's in the home?        [x]  Yes []  No  Does the patient demonstrate the ability to safely use a power wheelchair?    N/A                  Physician's Name Printed:        McDiarmid, Krystal BIRCH, MD                                                Physician's Signature:  Date:     This is to certify that I, the above signed therapist have the following affiliations: []  This DME provider []  Manufacturer of recommended equipment []  Patient's long term care facility [x]  None of the above  Therapist Name/Signature:  Elvie Kussmaul, PT                                         Date:  06-18-24  ASSESSMENT:  CLINICAL IMPRESSION: Patient is a 89 y.o. lady who was seen today for physical therapy evaluation for manual tilt in space wheelchair due to dependencies with mobility due to dementia.  Josh Cadle, ATP with [facility], present  in consult for wheelchair evaluation.  Pt requires a manual tilt in space wheelchair with postural supports including lateral trunk supports and a thigh guide for optimal positioning and support.  Manual tilt will enable pt's caregiver to adequately perform pressure relief for patient while seated in wheelchair.  Tilted positioning will assist in maintaining good skin integrity for the patient and help to prevent further skin breakdown due to pressure.     CLINICAL DECISION MAKING: Evolving/moderate complexity  EVALUATION COMPLEXITY: Moderate   PLAN:  PT FREQUENCY: one time visit  PT DURATION: 1 week  PLANNED INTERVENTIONS: evaluation, assistive technology report .  PLAN FOR NEXT SESSION: N/A - w/c eval only   Kussmaul Rock Elvie, PT 06/18/2024, 3:47 PM

## 2024-06-21 ENCOUNTER — Encounter (HOSPITAL_COMMUNITY): Payer: Self-pay

## 2024-06-21 ENCOUNTER — Observation Stay (HOSPITAL_COMMUNITY)
Admission: EM | Admit: 2024-06-21 | Discharge: 2024-06-22 | Disposition: A | Discharge status: Home / Self Care | Attending: Emergency Medicine | Admitting: Emergency Medicine

## 2024-06-21 ENCOUNTER — Other Ambulatory Visit: Payer: Self-pay

## 2024-06-21 ENCOUNTER — Emergency Department (HOSPITAL_COMMUNITY)

## 2024-06-21 DIAGNOSIS — F0390 Unspecified dementia without behavioral disturbance: Secondary | ICD-10-CM | POA: Insufficient documentation

## 2024-06-21 DIAGNOSIS — N3281 Overactive bladder: Secondary | ICD-10-CM | POA: Diagnosis not present

## 2024-06-21 DIAGNOSIS — L89150 Pressure ulcer of sacral region, unstageable: Secondary | ICD-10-CM | POA: Diagnosis not present

## 2024-06-21 DIAGNOSIS — I10 Essential (primary) hypertension: Secondary | ICD-10-CM | POA: Diagnosis not present

## 2024-06-21 DIAGNOSIS — N390 Urinary tract infection, site not specified: Secondary | ICD-10-CM | POA: Diagnosis present

## 2024-06-21 DIAGNOSIS — E785 Hyperlipidemia, unspecified: Secondary | ICD-10-CM | POA: Diagnosis not present

## 2024-06-21 DIAGNOSIS — E039 Hypothyroidism, unspecified: Secondary | ICD-10-CM | POA: Diagnosis not present

## 2024-06-21 DIAGNOSIS — H409 Unspecified glaucoma: Secondary | ICD-10-CM | POA: Diagnosis not present

## 2024-06-21 DIAGNOSIS — R109 Unspecified abdominal pain: Secondary | ICD-10-CM | POA: Insufficient documentation

## 2024-06-21 DIAGNOSIS — Z79899 Other long term (current) drug therapy: Secondary | ICD-10-CM | POA: Diagnosis not present

## 2024-06-21 DIAGNOSIS — M1380 Other specified arthritis, unspecified site: Secondary | ICD-10-CM | POA: Diagnosis not present

## 2024-06-21 DIAGNOSIS — I700 Atherosclerosis of aorta: Secondary | ICD-10-CM | POA: Diagnosis not present

## 2024-06-21 NOTE — ED Triage Notes (Signed)
 Pt BIB daughter who reports that pt has a UTI and that she needs to make sure she isnt septic. Pt just got put on abx for UTI today and has not had first dose yet.

## 2024-06-22 ENCOUNTER — Telehealth: Payer: Self-pay | Admitting: Family Medicine

## 2024-06-22 ENCOUNTER — Other Ambulatory Visit (HOSPITAL_COMMUNITY): Payer: Self-pay

## 2024-06-22 ENCOUNTER — Other Ambulatory Visit: Payer: Self-pay | Admitting: Family Medicine

## 2024-06-22 ENCOUNTER — Emergency Department (HOSPITAL_COMMUNITY)

## 2024-06-22 DIAGNOSIS — N390 Urinary tract infection, site not specified: Principal | ICD-10-CM | POA: Diagnosis present

## 2024-06-22 LAB — BASIC METABOLIC PANEL WITH GFR
Anion gap: 10 (ref 5–15)
BUN: 13 mg/dL (ref 8–23)
CO2: 28 mmol/L (ref 22–32)
Calcium: 8.5 mg/dL — ABNORMAL LOW (ref 8.9–10.3)
Chloride: 103 mmol/L (ref 98–111)
Creatinine, Ser: 0.64 mg/dL (ref 0.44–1.00)
GFR, Estimated: >60 mL/min (ref >60)
Glucose, Bld: 74 mg/dL (ref 70–99)
Potassium: 3.5 mmol/L (ref 3.5–5.1)
Sodium: 141 mmol/L (ref 135–145)

## 2024-06-22 LAB — COMPREHENSIVE METABOLIC PANEL WITH GFR
ALT: 9 U/L (ref 0–44)
AST: 14 U/L — ABNORMAL LOW (ref 15–41)
Albumin: 3.2 g/dL — ABNORMAL LOW (ref 3.5–5.0)
Alkaline Phosphatase: 38 U/L (ref 38–126)
Anion gap: 11 (ref 5–15)
BUN: 14 mg/dL (ref 8–23)
CO2: 27 mmol/L (ref 22–32)
Calcium: 8.6 mg/dL — ABNORMAL LOW (ref 8.9–10.3)
Chloride: 102 mmol/L (ref 98–111)
Creatinine, Ser: 0.63 mg/dL (ref 0.44–1.00)
GFR, Estimated: >60 mL/min (ref >60)
Glucose, Bld: 115 mg/dL — ABNORMAL HIGH (ref 70–99)
Potassium: 4 mmol/L (ref 3.5–5.1)
Sodium: 140 mmol/L (ref 135–145)
Total Bilirubin: 0.9 mg/dL (ref 0.0–1.2)
Total Protein: 5.9 g/dL — ABNORMAL LOW (ref 6.5–8.1)

## 2024-06-22 LAB — CBC WITH DIFFERENTIAL/PLATELET
Abs Immature Granulocytes: 0.02 K/uL (ref 0.00–0.07)
Basophils Absolute: 0 K/uL (ref 0.0–0.1)
Basophils Relative: 0 %
Eosinophils Absolute: 0.1 K/uL (ref 0.0–0.5)
Eosinophils Relative: 3 %
HCT: 34.4 % — ABNORMAL LOW (ref 36.0–46.0)
Hemoglobin: 11 g/dL — ABNORMAL LOW (ref 12.0–15.0)
Immature Granulocytes: 1 %
Lymphocytes Relative: 23 %
Lymphs Abs: 0.8 K/uL (ref 0.7–4.0)
MCH: 31.2 pg (ref 26.0–34.0)
MCHC: 32 g/dL (ref 30.0–36.0)
MCV: 97.5 fL (ref 80.0–100.0)
Monocytes Absolute: 0.3 K/uL (ref 0.1–1.0)
Monocytes Relative: 9 %
Neutro Abs: 2.2 K/uL (ref 1.7–7.7)
Neutrophils Relative %: 64 %
Platelets: 216 K/uL (ref 150–400)
RBC: 3.53 MIL/uL — ABNORMAL LOW (ref 3.87–5.11)
RDW: 13.7 % (ref 11.5–15.5)
WBC: 3.5 K/uL — ABNORMAL LOW (ref 4.0–10.5)
nRBC: 0 % (ref 0.0–0.2)

## 2024-06-22 LAB — URINALYSIS, ROUTINE W REFLEX MICROSCOPIC
Bilirubin Urine: NEGATIVE
Glucose, UA: NEGATIVE mg/dL
Ketones, ur: NEGATIVE mg/dL
Nitrite: NEGATIVE
Protein, ur: 100 mg/dL — AB
RBC / HPF: >50 RBC/hpf (ref 0–5)
Specific Gravity, Urine: 1.015 (ref 1.005–1.030)
WBC, UA: >50 WBC/hpf (ref 0–5)
pH: 6 (ref 5.0–8.0)

## 2024-06-22 LAB — LACTIC ACID, PLASMA: Lactic Acid, Venous: 1.1 mmol/L (ref 0.5–1.9)

## 2024-06-22 MED ORDER — CIPROFLOXACIN HCL 250 MG PO TABS: 250.0000 mg | ORAL_TABLET | Freq: Two times a day (BID) | ORAL | 0 refills | Status: DC

## 2024-06-22 MED ORDER — SODIUM CHLORIDE 0.9 % IV SOLN
1.0000 g | Freq: Once | INTRAVENOUS | Status: AC
  Administered 2024-06-22: 1 g via INTRAVENOUS
  Filled 2024-06-22: qty 10

## 2024-06-22 MED ORDER — ACETAMINOPHEN 500 MG PO TABS
1000.0000 mg | ORAL_TABLET | Freq: Four times a day (QID) | ORAL | Status: DC | PRN
  Filled 2024-06-22: qty 2

## 2024-06-22 MED ORDER — SULFAMETHOXAZOLE-TRIMETHOPRIM 800-160 MG PO TABS
1.0000 | ORAL_TABLET | Freq: Two times a day (BID) | ORAL | 0 refills | Status: AC
  Filled 2024-06-22: qty 14, 7d supply, fill #0

## 2024-06-22 MED ORDER — SULFAMETHOXAZOLE-TRIMETHOPRIM 800-160 MG PO TABS
1.0000 | ORAL_TABLET | Freq: Two times a day (BID) | ORAL | Status: DC
  Administered 2024-06-22 (×2): 1 via ORAL
  Filled 2024-06-22 (×2): qty 1

## 2024-06-22 MED ORDER — SULFAMETHOXAZOLE-TRIMETHOPRIM 800-160 MG PO TABS
2.0000 | ORAL_TABLET | Freq: Two times a day (BID) | ORAL | Status: DC
Start: 2024-06-22 — End: 2024-06-22

## 2024-06-22 NOTE — Progress Notes (Signed)
 Patient arrived to the unit in a stretcher, came from ED. Assessment and vital signs completed, WNL. Patient does not report any complaints nor pain at the moment. Call light was left within reach. Will continue to monitor.

## 2024-06-22 NOTE — Telephone Encounter (Signed)
 Called patient's daughter to update regarding urine culture results. Discussed Ciprofloxacin  regimen to better organisms on culture. Verified with daughter that they already had picked up a script for Ciprofloxacin  earlier this week but had not started it. Per daughter, this script is Ciprofloaxin 250 BID x 7 days. Advised daughter to give this medication to patient as prescribed and discontinue the Bactrim  that prescribed at discharge from the hospital. Daughter has given the evening dose of Bactrim  already today. Advised to begin full Ciprofloxacin  regimen tomorrow. Questions addressed. Patient daughter expressed understanding and was appreciative of call.

## 2024-06-22 NOTE — Hospital Course (Addendum)
 CD is a 89yo F with history of dementia, suprapubic cath, UTI that is admitted for UTI.  Complicated UTI UA c/w UTI. Afebrile, no leukocytosis, and hemodynamically stable. Ct Renal without hydronephrosis or stone. Patient was started on ceftriaxone  and transitioned to Bactrim  x 7 days (Prior UCX positive for serratia). Suprapubic catheter exchanged on 06/22/2024. Pt monitored ON, VSS and was discharged with PCP follow-up.    Follow-up Recommendations 1) Follow-up urine culture, transition antibiotics as needed.  2) Recheck BMP 3) Follow possible constipation seen on imaging

## 2024-06-22 NOTE — Plan of Care (Signed)

## 2024-06-22 NOTE — H&P (Addendum)
 Hospital Admission History and Physical Service Pager: [phone]  Patient name: Hayley Fisher Medical record number: [id number] Date of Birth: [birth year] Age: 89 y.o. Gender: female  Primary Care Provider: Raguel Lee, DO Consultants: None Code Status: Full code, but only desires one round of CPR, which was confirmed with both daughters Hayley Fisher and Hayley Fisher over the phone. They do not want to continue CPR past 1 round.  Preferred Emergency Contact:  Contact Information     Name Relation Home Work [place] Daughter [phone]  [phone]   Cornelius,LaLeisa Daughter   [phone]      Other Contacts   None on File      Chief Complaint: UTI  Differential and Medical Decision Making:  Hayley Fisher is a 89 y.o. female presenting with UTI and being admitted for observation. Assessment & Plan UTI (urinary tract infection) Patient with UTI diagnosed elsewhere today.  Prescribed antibiotics that were not picked up.  Daughter brought her to ED due to concern for sepsis.  Patient not septic at this time.  S/p 1 g ceftriaxone .  Admitted for observation. - Admit to FMTS, attending Dr. Donzetta - Vital signs per floor - Bactrim  800-160 mg 1 tablet every 12 hours (Previously grew serratia susceptible to this on 7/21 Ucx) - blood cultures x2 pending - Urine cultures pending - Tylenol  1000 mg every 6 hours as needed - AM BMP Chronic health problem *Will wait to order home meds, pending time of discharge*  Severe dementia: On mirtazapine 15 mg and trazodone  100 mg nightly HLD: Lipitor 40 mg daily HTN: amlodipine  5 mg and Metoprolol  25 mg daily Glaucoma: Brimonidine  0.2% eye drop into L eye BID and Cosopt  bilaterally daily, continued on admission. Overactive bladder: Chronic use of suprapubic catheter Arthritis: uses Tylenol  prn for pain Sacral pressure wound: Tylenol  for pain    FEN/GI: Regular diet  VTE Prophylaxis: SCDs  Disposition: med surg   History  of Present Illness:  Hayley Fisher is a 89 y.o. female presenting with her daughter for UTI.  Patient has a history of UTIs and sepsis in the setting of a suprapubic catheter.  Her daughter is concerned that she could be septic.  Her catheter was last changed 1 week ago.  She was taken somewhere today and got a urinalysis which showed a UTI.  The patient was prescribed antibiotics, however they were not picked up.  Patient complains of pain on her backside in the crease of her buttocks.  She has a known pressure wound.  In the ED, patient was afebrile, WBC 3.5, hemoglobin 11.  Lactic acid 1.1.  UA was amber and turbid with small hemoglobin, 100 protein, large leukocytes, few bacteria.  CT renal stone was negative for hydronephrosis or ureteral stone, suprapubic catheter and bladder, diffuse thick-walled appearance of bladder, large stool burden throughout the colon suggesting constipation, aortic atherosclerosis.  Patient was given 1 g ceftriaxone  in the ED.  Review Of Systems: Per HPI   Pertinent Past Medical History: Dementia HLD Hypothyroidism  T2DM   Remainder reviewed in history tab.   Pertinent Past Surgical History: Hernia Repair  Abdominal hysterectomy  Bladder suspension     Remainder reviewed in history tab.  Pertinent Social History: Tobacco use: Former, quit 1982 Alcohol  use: denies  Other Substance use: denies  Lives with daughter, son-in-law  Pertinent Family History: Mother: colon cancer  Daughter: breast cancer   Important Outpatient Medications: Acetaminophen  1000 mg every 8 hours as needed Amlodipine  5 mg Atorvastatin   40 mg Voltaren  1% gel  Pepcid  20 mg daily  Lantus  6 units daily Levothyroxine  50 mcg daily Metformin  1000 mg daily Metoprolol  25 mg daily Mirtazapine 15 mg at bedtime  MiraLAX  17 g as needed Senna 1 tablet 2 times daily Trazodone  100 mg at bedtime    Objective: BP (!) 169/89   Pulse 84   Temp 98.3 F (36.8 C) (Oral)   Resp 15    Ht 5' 2 (1.575 m)   Wt 57.6 kg   SpO2 100%   BMI 23.23 kg/m  Exam: General: Elderly female, NAD Cardiovascular: RRR, no M/R/G Respiratory: CTAB, normal work of breathing on room air Gastrointestinal: Soft, nondistended, nontender to palpation Psych: Pleasantly demented  Labs:  CBC BMET  Recent Labs  Lab 06/21/24 2349  WBC 3.5*  HGB 11.0*  HCT 34.4*  PLT 216   Recent Labs  Lab 06/21/24 2349  NA 140  K 4.0  CL 102  CO2 27  BUN 14  CREATININE 0.63  GLUCOSE 115*  CALCIUM  8.6*    Pertinent additional labs UA was amber and turbid with small hemoglobin, 100 protein, large leukocytes, few bacteria.  Lactic acid 1.1.  Imaging Studies Performed:  CT Renal Stone  1. Negative for hydronephrosis or ureteral stone. 2. Suprapubic catheter in the bladder. Diffuse thick-walled appearance of the bladder, correlate with urinalysis 3. Large stool burden throughout the colon suggests constipation. 4. Aortic atherosclerosis.   Lennie Raguel MATSU, DO 06/22/2024, 2:04 AM PGY-1, [facility] Family [facility]  FPTS Intern pager: [phone], text pages welcome Secure chat group [facility] Teaching Service

## 2024-06-22 NOTE — Assessment & Plan Note (Signed)
*  Will wait to order home meds, pending time of discharge*  Severe dementia: On mirtazapine 15 mg and trazodone  100 mg nightly HLD: Lipitor 40 mg daily HTN: amlodipine  5 mg and Metoprolol  25 mg daily Glaucoma: Brimonidine  0.2% eye drop into L eye BID and Cosopt  bilaterally daily, continued on admission. Overactive bladder: Chronic use of suprapubic catheter Arthritis: uses Tylenol  prn for pain Sacral pressure wound: Tylenol  for pain

## 2024-06-22 NOTE — Discharge Summary (Signed)
 Family Medicine Teaching [facility] Discharge Summary  Patient name: Hayley Fisher Medical record number: [id number] Date of birth: Jul 12, 1931 Age: 89 y.o. Gender: female Date of Admission: 06/21/2024  Date of Discharge: 06/22/24 Admitting Physician: Twyla Nearing, MD  Primary Care Provider: Lennie Raguel MATSU, DO Consultants: N/a  Indication for Hospitalization: UTI  Discharge Diagnoses/Problem List:  Principal Problem for Admission: UTI Other Problems addressed during stay:  Active Problems:   UTI (urinary tract infection)   Brief Hospital Course:  CD is a 89yo F with history of dementia, suprapubic cath, UTI that is admitted for UTI.  Complicated UTI UA c/w UTI. Afebrile, no leukocytosis, and hemodynamically stable. Ct Renal without hydronephrosis or stone. Patient was started on ceftriaxone  and transitioned to Bactrim  x 7 days (Prior UCX positive for serratia). Suprapubic catheter exchanged on 06/22/2024. Pt monitored ON, VSS and was discharged with PCP follow-up.    Follow-up Recommendations 1) Follow-up urine culture, transition antibiotics as needed.  2) Recheck BMP 3) Follow possible constipation seen on imaging   Results/Tests Pending at Time of Discharge:  Unresulted Labs (From admission, onward)     Start     Ordered   06/21/24 2330  Urine Culture  Once,   URGENT       Question:  Indication  Answer:  Dysuria   06/21/24 2330           Disposition: Home  Discharge Condition: Improved and stable  Discharge Exam:  Vitals:   06/22/24 0811 06/22/24 1215  BP: (!) 151/78 (!) 153/76  Pulse: 86 92  Resp: 15   Temp: 97.8 F (36.6 C) 98.2 F (36.8 C)  SpO2: 97% 98%   General: No acute distress. Resting comfortably in room. CV: Known soft systolic murmur, D8/D7. Warm and well-perfused. Pulm: Breathing comfortably on room air. CTAB of anterior fields. No increased WOB. Abd: Soft, non-tender, non-distended.  Superpubic catheter in place. Skin:  Warm,  dry. Psych: Pleasant and appropriate.   Significant Procedures: Suprapubic catheter exchanged on 06/22/2024.  Significant Labs and Imaging:  Recent Labs  Lab 06/21/24 2349  WBC 3.5*  HGB 11.0*  HCT 34.4*  PLT 216   Recent Labs  Lab 06/21/24 2349 06/22/24 0542  NA 140 141  K 4.0 3.5  CL 102 103  CO2 27 28  GLUCOSE 115* 74  BUN 14 13  CREATININE 0.63 0.64  CALCIUM  8.6* 8.5*  ALKPHOS 38  --   AST 14*  --   ALT 9  --   ALBUMIN 3.2*  --    CT Renal: Negative for hydronephrosis or ureteral stone.  Discharge Medications:  Allergies as of 06/22/2024       Reactions   Doxepin  Hcl Other (See Comments)   Hallucinations increased   Rexulti  [brexpiprazole ] Other (See Comments)   Increase agitation   Levemir  [insulin  Detemir] Itching   Tramadol  Itching, Other (See Comments)   Takes occasionally with Benadryl         Medication List     STOP taking these medications    ciprofloxacin  250 MG tablet Commonly known as: CIPRO        TAKE these medications    acetaminophen  500 MG tablet Commonly known as: TYLENOL  Take 2 tablets (1,000 mg total) by mouth every 8 (eight) hours.   amLODipine  5 MG tablet Commonly known as: NORVASC  Take 1 tablet (5 mg total) by mouth daily.   atorvastatin  40 MG tablet Commonly known as: LIPITOR Take 1 tablet (40 mg total) by mouth daily.  brimonidine  0.2 % ophthalmic solution Commonly known as: ALPHAGAN  Place 1 drop into the left eye 2 (two) times daily.   dorzolamide -timolol  2-0.5 % ophthalmic solution Commonly known as: COSOPT  Place 1 drop into both eyes 2 (two) times daily.   DuoDERM CGF Extra Thin Misc Apply 1 each topically every three (3) days as needed (Change every 3-5 days, or sooner if soiled.).   famotidine  20 MG tablet Commonly known as: PEPCID  Take 40 mg by mouth daily as needed for heartburn or indigestion.   Lantus  SoloStar 100 UNIT/ML Solostar Pen Generic drug: insulin  glargine Inject 6 Units into the skin  daily. What changed: how much to take   levothyroxine  50 MCG tablet Commonly known as: SYNTHROID  Take 1 tablet (50 mcg total) by mouth daily.   metFORMIN  500 MG 24 hr tablet Commonly known as: GLUCOPHAGE -XR Take 2 tablets (1,000 mg total) by mouth daily with breakfast.   metoprolol  succinate 25 MG 24 hr tablet Commonly known as: TOPROL -XL Take 1 tablet (25 mg total) by mouth daily.   mirtazapine 15 MG tablet Commonly known as: Remeron Take 1 tablet (15 mg total) by mouth at bedtime.   Myrbetriq  25 MG Tb24 tablet Generic drug: mirabegron  ER Take 25 mg by mouth daily.   polyethylene glycol powder 17 GM/SCOOP powder Commonly known as: GLYCOLAX /MIRALAX  Take 17 g by mouth in the morning and at bedtime. What changed: when to take this   senna 8.6 MG Tabs tablet Commonly known as: SENOKOT Take 1 tablet (8.6 mg total) by mouth 2 (two) times daily. What changed:  when to take this reasons to take this   sulfamethoxazole -trimethoprim  800-160 MG tablet Commonly known as: BACTRIM  DS Take 1 tablet by mouth 2 (two) times daily for 7 days.   tamsulosin  0.4 MG Caps capsule Commonly known as: FLOMAX  Take 0.4 mg by mouth daily.   traZODone  100 MG tablet Commonly known as: DESYREL  Take 1 tablet (100 mg total) by mouth at bedtime as needed for sleep.        Discharge Instructions: Please refer to Patient Instructions section of EMR for full details.  Patient was counseled important signs and symptoms that should prompt return to medical care, changes in medications, dietary instructions, activity restrictions, and follow up appointments.   Follow-Up Appointments:  Future Appointments  Date Time Provider Department Center  06/26/2024  9:10 AM Romelle Booty, MD [facility]  09/02/2024  4:15 PM Loreda Hacker, DPM [facility]  09/24/2024  2:00 PM Shamleffer, Donell Cardinal, MD [facility] None  10/28/2024  1:40 PM [facility] ANNUAL [place] VISIT [facility]    Diona Perkins, MD 06/22/2024, 3:44 PM PGY-2, [place] [facility]

## 2024-06-22 NOTE — Care Management Obs Status (Signed)
 MEDICARE OBSERVATION STATUS NOTIFICATION   Patient Details  Name: Allaya Abbasi MRN: [id number] Date of Birth: [birth year]   Medicare Observation Status Notification Given:  Yes    Jonatha Gagen G., RN 06/22/2024, 9:39 AM

## 2024-06-22 NOTE — Progress Notes (Signed)
 Discharge Nurse Summary: DC order noted per MD. DC RN at bedside with patient. Patient agreeable with discharge plan, dtr at the bedside for transport. AVS printed/reviewed. PIV removed, skin intact. No DME needs. No home meds. TOC meds delivered to the patient. CP/Edu resolved. Telemonitor not present on assessment. All belongings accounted for. Suprapubic catheter CDI, intact. See LDAs. Patient wheeled downstairs for discharge by private auto.   Rosario EMERSON Lund, RN

## 2024-06-22 NOTE — Discharge Instructions (Addendum)
 Dear Hayley Fisher,   Thank you so much for allowing us  to be part of your care!  You were admitted to [facility] for a urinary tract infection (UTI). Please finish taking the antibiotics as prescribed. Your catheter was exchanged on 06/22/24.   You are scheduled to follow up in at the [facility] next Wednesday 11/5 at 9 AM.   POST-HOSPITAL & CARE INSTRUCTIONS Please let PCP/Specialists know of any changes that were made.  Please see medications section of this packet for any medication changes.   DOCTOR'S APPOINTMENT & FOLLOW UP CARE INSTRUCTIONS  Future Appointments  Date Time Provider Department Center  06/26/2024  9:10 AM Romelle Booty, MD [facility]  09/02/2024  4:15 PM Loreda Hacker, DPM [facility]  09/24/2024  2:00 PM Shamleffer, Donell Cardinal, MD [facility] None  10/28/2024  1:40 PM [facility] ANNUAL WELLNESS VISIT [facility]    Take care and be well!  Family Medicine Teaching Service  [place]  [facility]  [address] [place], [place] [address] [phone]

## 2024-06-22 NOTE — Plan of Care (Signed)

## 2024-06-22 NOTE — Progress Notes (Signed)
 Suprapubic catheter site was cleaned and its dressing was changed. Noted some pus discharge with foul smell surrounding the catheter.

## 2024-06-22 NOTE — ED Provider Notes (Addendum)
 [place] EMERGENCY DEPARTMENT AT [facility] Provider Note   CSN: [id number] Arrival date & time: 06/21/24  1729     Patient presents with: Urinary Frequency   Hayley Fisher is a 89 y.o. female.   Patient with a history of hypertension, diabetes, suprapubic catheter in place presents with concern for UTI.  She was given antibiotics today which she has not yet filled.  Daughter is concerned that she could be septic.  There is no fever.  There is no vomiting, nausea, chest pain, shortness of breath.   No fever.  No vomiting.  Having some pain to the catheter.  It was changed 1 week ago on Thursday.    Denies any dysuria or hematuria.  Still urinating normally.  The history is provided by the patient.  Urinary Frequency Pertinent negatives include no chest pain, no abdominal pain and no shortness of breath.       Prior to Admission medications   Medication Sig Start Date End Date Taking? Authorizing Provider  acetaminophen  (TYLENOL ) 500 MG tablet Take 2 tablets (1,000 mg total) by mouth every 8 (eight) hours. 04/26/24   McDiarmid, Krystal BIRCH, MD  amLODipine  (NORVASC ) 5 MG tablet Take 1 tablet (5 mg total) by mouth daily. 05/28/24   Baker, Raguel MATSU, DO  ascorbic acid  (VITAMIN C ) 500 MG tablet Take 1 tablet (500 mg total) by mouth 2 (two) times daily. 03/21/24   Shitarev, Dimitry, MD  atorvastatin  (LIPITOR) 40 MG tablet Take 1 tablet (40 mg total) by mouth daily. 04/03/24   Baker, Raguel MATSU, DO  brimonidine  (ALPHAGAN ) 0.2 % ophthalmic solution Place 1 drop into the left eye 2 (two) times daily. 03/30/21   [provider]  Control Gel Formula Dressing (DUODERM CGF EXTRA THIN) MISC Apply 1 each topically every three (3) days as needed (Change every 3-5 days, or sooner if soiled.). 03/06/24   Lafe Domino, DO  diclofenac  Sodium (VOLTAREN ) 1 % GEL Apply 2 g topically 4 (four) times daily. Apply over the affected area.  Please avoid the genital areas. 10/03/23   Rosendo Rush, MD   dorzolamide -timolol  (COSOPT ) 22.3-6.8 MG/ML ophthalmic solution Place 1 drop into both eyes 2 (two) times daily. 05/18/20   [provider]  famotidine  (PEPCID ) 20 MG tablet Take 20 mg by mouth daily. 04/01/24   [provider]  insulin  glargine (LANTUS  SOLOSTAR) 100 UNIT/ML Solostar Pen Inject 6 Units into the skin daily. 05/22/24   Shamleffer, Ibtehal Jaralla, MD  levothyroxine  (SYNTHROID ) 50 MCG tablet Take 1 tablet (50 mcg total) by mouth daily. 05/22/24   Shamleffer, Ibtehal Jaralla, MD  metFORMIN  (GLUCOPHAGE -XR) 500 MG 24 hr tablet Take 2 tablets (1,000 mg total) by mouth daily with breakfast. 05/22/24   Shamleffer, Donell Cardinal, MD  metoprolol  succinate (TOPROL -XL) 25 MG 24 hr tablet Take 1 tablet (25 mg total) by mouth daily. 05/30/24   Baker, Raguel MATSU, DO  mirtazapine (REMERON) 15 MG tablet Take 1 tablet (15 mg total) by mouth at bedtime. 06/13/24   Baker, Amelia G, DO  polyethylene glycol powder (GLYCOLAX /MIRALAX ) 17 GM/SCOOP powder Take 17 g by mouth in the morning and at bedtime. Patient taking differently: Take 17 g by mouth daily. 08/20/21   Jagadish, Mayuri, MD  senna (SENOKOT) 8.6 MG TABS tablet Take 1 tablet (8.6 mg total) by mouth 2 (two) times daily. Patient taking differently: Take 1 tablet by mouth daily as needed for mild constipation. 08/09/21   Marlee Lynwood NOVAK, MD  traZODone  (DESYREL ) 100 MG  tablet Take 1 tablet (100 mg total) by mouth at bedtime as needed for sleep. 06/06/24   Lennie Raguel MATSU, DO    Allergies: Doxepin  hcl, Rexulti  [brexpiprazole ], Levemir  [insulin  detemir], and Tramadol     Review of Systems  Constitutional:  Negative for activity change, appetite change and fever.  HENT:  Negative for congestion and rhinorrhea.   Respiratory:  Negative for cough, chest tightness and shortness of breath.   Cardiovascular:  Negative for chest pain.  Gastrointestinal:  Negative for abdominal pain, nausea and vomiting.  Genitourinary:  Positive for dysuria  and frequency. Negative for hematuria.  Musculoskeletal:  Negative for arthralgias and myalgias.  Skin:  Negative for wound.  Neurological:  Negative for facial asymmetry and weakness.    all other systems are negative except as noted in the HPI and PMH.   Updated Vital Signs BP 136/76 (BP Location: Right Arm)   Pulse 89   Temp 98.4 F (36.9 C)   Resp 17   Ht 5' 2 (1.575 m)   Wt 57.6 kg   SpO2 96%   BMI 23.23 kg/m   Physical Exam Vitals and nursing note reviewed.  Constitutional:      General: She is not in acute distress.    Appearance: She is well-developed. She is not ill-appearing.  HENT:     Head: Normocephalic and atraumatic.     Mouth/Throat:     Pharynx: No oropharyngeal exudate.  Eyes:     Conjunctiva/sclera: Conjunctivae normal.     Pupils: Pupils are equal, round, and reactive to light.  Neck:     Comments: No meningismus. Cardiovascular:     Rate and Rhythm: Normal rate and regular rhythm.     Heart sounds: Normal heart sounds. No murmur heard. Pulmonary:     Effort: Pulmonary effort is normal. No respiratory distress.     Breath sounds: Normal breath sounds.  Abdominal:     Palpations: Abdomen is soft.     Tenderness: There is no abdominal tenderness. There is no guarding or rebound.     Comments: Suprapubic catheter in place. No drainage or discharge  Musculoskeletal:        General: No tenderness. Normal range of motion.     Cervical back: Normal range of motion and neck supple.  Skin:    General: Skin is warm.  Neurological:     Mental Status: She is alert and oriented to person, place, and time.     Cranial Nerves: No cranial nerve deficit.     Motor: No abnormal muscle tone.     Coordination: Coordination normal.     Comments:  5/5 strength throughout. CN 2-12 intact.Equal grip strength.   Psychiatric:        Behavior: Behavior normal.     (all labs ordered are listed, but only abnormal results are displayed) Labs Reviewed  URINALYSIS,  ROUTINE W REFLEX MICROSCOPIC - Abnormal; Notable for the following components:      Result Value   Color, Urine AMBER (*)    APPearance TURBID (*)    Hgb urine dipstick SMALL (*)    Protein, ur 100 (*)    Leukocytes,Ua LARGE (*)    Bacteria, UA FEW (*)    All other components within normal limits  CBC WITH DIFFERENTIAL/PLATELET - Abnormal; Notable for the following components:   WBC 3.5 (*)    RBC 3.53 (*)    Hemoglobin 11.0 (*)    HCT 34.4 (*)    All other components within  normal limits  COMPREHENSIVE METABOLIC PANEL WITH GFR - Abnormal; Notable for the following components:   Glucose, Bld 115 (*)    Calcium  8.6 (*)    Total Protein 5.9 (*)    Albumin 3.2 (*)    AST 14 (*)    All other components within normal limits  BASIC METABOLIC PANEL WITH GFR - Abnormal; Notable for the following components:   Calcium  8.5 (*)    All other components within normal limits  URINE CULTURE  CULTURE, BLOOD (ROUTINE X 2)  CULTURE, BLOOD (ROUTINE X 2)  LACTIC ACID, PLASMA    EKG: None  Radiology: CT Renal Stone Study Result Date: 06/22/2024 CLINICAL DATA:  Flank pain EXAM: CT ABDOMEN AND PELVIS WITHOUT CONTRAST TECHNIQUE: Multidetector CT imaging of the abdomen and pelvis was performed following the standard protocol without IV contrast. RADIATION DOSE REDUCTION: This exam was performed according to the departmental dose-optimization program which includes automated exposure control, adjustment of the mA and/or kV according to patient size and/or use of iterative reconstruction technique. COMPARISON:  CT 05/31/2023, 05/02/2023, 08/08/2021 FINDINGS: Lower chest: Lung bases demonstrate atelectasis or pneumonia at the bases. Multi-vessel coronary vascular calcification. Aortic valvular calcification. Hepatobiliary: No focal liver abnormality is seen. No gallstones, gallbladder wall thickening, or biliary dilatation. Pancreas: Unremarkable. No pancreatic ductal dilatation or surrounding inflammatory  changes. Spleen: Normal in size without focal abnormality. Adrenals/Urinary Tract: Adrenal glands are normal. No hydronephrosis. Large cyst left kidney, no imaging follow-up is recommended. Suprapubic catheter in the bladder. Diffuse thick-walled appearance of the bladder. Stomach/Bowel: Stomach nonenlarged. No dilated small bowel. No acute bowel wall thickening. Large stool burden throughout the colon Vascular/Lymphatic: Advanced aortic atherosclerosis. No aneurysm. IVC filter again noted. Reproductive: Hysterectomy.  No adnexal mass Other: No ascites or free air Musculoskeletal: No acute osseous abnormality IMPRESSION: 1. Negative for hydronephrosis or ureteral stone. 2. Suprapubic catheter in the bladder. Diffuse thick-walled appearance of the bladder, correlate with urinalysis 3. Large stool burden throughout the colon suggests constipation. 4. Aortic atherosclerosis. Aortic Atherosclerosis (ICD10-I70.0). Electronically Signed   By: Luke Bun M.D.   On: 06/22/2024 00:29     Procedures   Medications Ordered in the ED - No data to display                                  Medical Decision Making Amount and/or Complexity of Data Reviewed Labs: ordered. Decision-making details documented in ED Course. Radiology: ordered and independent interpretation performed. Decision-making details documented in ED Course. ECG/medicine tests: ordered and independent interpretation performed. Decision-making details documented in ED Course.  Risk Decision regarding hospitalization.   Suprapubic pain and concern for UTI.  Does have suprapubic catheter in place.  Stable vitals.  No fever.  No chest pain or shortness of breath.  Vital signs remained stable.  Urinalysis concerning for infection.  More confusion from baseline per daughter. Denies abdominal pain or back pain. Lactate normal. No significant leukocytosis.  Does not appear toxic or septic.   Urinalysis concerning for infection.  Cultures are  sent.  Vital signs are stable.  No fever.  No leukocytosis.  Creatinine at baseline.  Does not appear that she is septic or toxic.  CT scan does not show any obstructive uropathy. Does show cystitis and constipation.  Daughter feels she is not at her baseline and is concerned that she could get sicker than she is now.  Given age and risk factors  would not be unreasonable to admit while awaiting cultures. IV rocephin  given.  D/w family practice residents.     Final diagnoses:  Complicated UTI (urinary tract infection)    ED Discharge Orders     None          Marye Eagen, Garnette, MD 06/22/24 9067    Carita Garnette, MD 06/22/24 [phone]

## 2024-06-22 NOTE — Assessment & Plan Note (Addendum)
 Patient with UTI diagnosed elsewhere today.  Prescribed antibiotics that were not picked up.  Daughter brought her to ED due to concern for sepsis.  Patient not septic at this time.  S/p 1 g ceftriaxone .  Admitted for observation. - Admit to FMTS, attending Dr. Donzetta - Vital signs per floor - Bactrim  800-160 mg 1 tablet every 12 hours (Previously grew serratia susceptible to this on 7/21 Ucx) - blood cultures x2 pending - Urine cultures pending - Tylenol  1000 mg every 6 hours as needed - AM BMP

## 2024-06-24 ENCOUNTER — Ambulatory Visit: Payer: Self-pay

## 2024-06-24 LAB — URINE CULTURE: Culture: >=100000 — AB

## 2024-06-25 ENCOUNTER — Other Ambulatory Visit: Payer: Self-pay

## 2024-06-25 MED ORDER — MYRBETRIQ 25 MG PO TB24: 25.0000 mg | ORAL_TABLET | Freq: Every day | ORAL | 1 refills | Status: DC

## 2024-06-26 ENCOUNTER — Inpatient Hospital Stay: Payer: Self-pay | Admitting: Family Medicine

## 2024-06-26 NOTE — Progress Notes (Deleted)
    SUBJECTIVE:   CHIEF COMPLAINT / HPI:   Hospital follow-up Was admitted 10/31-11/1 for UTI in the setting of her dementia, suprapubic cath.  She was initially discharged on Bactrim  but this was switched to Cipro  250 mg twice daily x 7 days on 11/1 as culture grew Pseudomonas and Serratia. ***  Per DC summary: Follow-up Recommendations 1) Follow-up urine culture, transition antibiotics as needed.  2) Recheck BMP 3) Follow possible constipation seen on imaging   Today:  ***  PERTINENT  PMH / PSH: ***  OBJECTIVE:   There were no vitals taken for this visit.  ***  ASSESSMENT/PLAN:   Assessment & Plan    Payton Coward, MD [facility]

## 2024-06-27 LAB — CULTURE, BLOOD (ROUTINE X 2)
Culture: NO GROWTH
Culture: NO GROWTH

## 2024-07-01 ENCOUNTER — Encounter: Payer: Self-pay | Admitting: Family Medicine

## 2024-07-01 ENCOUNTER — Ambulatory Visit (INDEPENDENT_AMBULATORY_CARE_PROVIDER_SITE_OTHER): Admitting: Family Medicine

## 2024-07-01 VITALS — BP 140/77 | HR 93

## 2024-07-01 DIAGNOSIS — N390 Urinary tract infection, site not specified: Secondary | ICD-10-CM | POA: Diagnosis not present

## 2024-07-01 DIAGNOSIS — Z978 Presence of other specified devices: Secondary | ICD-10-CM

## 2024-07-01 NOTE — Progress Notes (Unsigned)
    SUBJECTIVE:   CHIEF COMPLAINT / HPI:   UTI Patient presenting today for hospital follow-up for recent admission for UTI.  Patient completed oral antibiotic regimen with improvement of symptoms.  Per family, patient is doing better overall.  Recent urine in catheter bag has been more clear.  No reports of pain.  No fevers.  Eating as she normally does.  PERTINENT  PMH / PSH: Dementia, chronic indwelling Foley, HTN, T2DM,   OBJECTIVE:   BP (!) 140/77   Pulse 93   SpO2 100%   General: No acute distress resting comfortably in wheelchair. CV: S1/S2 with known soft systolic murmur. Warm and well-perfused. Pulm: Breathing comfortably on room air. CTAB. No increased WOB. Abd: Soft, non-tender, non-distended. Skin:  Warm, dry. Ext: Mild edema around bilateral ankles, noted to be typical baseline for patient per family.  Bilateral calves and overall lower extremities nontender to palpation.  ASSESSMENT/PLAN:   Assessment & Plan Urinary tract infection without hematuria, site unspecified Foley catheter in place Patient doing well following recent hospitalization for UTI.  Urine culture with Serratia and Pseudomonas, patient completed ciprofloxacin  antibiotic regimen.  Exam reassuring today.  Foley last exchanged on day of hospital discharge 06/22/24.  - Continue home medications as currently prescribed - Discussed and provided advanced directive information   RTC for PCP follow-up as needed  Damien Cassis, MD [facility]

## 2024-07-01 NOTE — Patient Instructions (Signed)
 Thank you for visiting clinic today and allowing us  to participate in your care!  So glad you are doing well. Please continue taking your medications as prescribed.   You can fill out the Advanced Directive form and return to clinic to get it notarized. Feel free to call our clinic any time about this.   Please schedule an appointment as needed with your PCP.    Reach out any time with any questions or concerns you may have - we are here for you!  Damien Cassis, MD [facility] [phone]

## 2024-07-02 NOTE — Assessment & Plan Note (Signed)
 Patient doing well following recent hospitalization for UTI.  Urine culture with Serratia and Pseudomonas, patient completed ciprofloxacin  antibiotic regimen.  Exam reassuring today.  Foley last exchanged on day of hospital discharge 06/22/24.  - Continue home medications as currently prescribed - Discussed and provided advanced directive information

## 2024-07-04 ENCOUNTER — Encounter (HOSPITAL_COMMUNITY): Payer: Self-pay

## 2024-07-04 ENCOUNTER — Emergency Department (HOSPITAL_COMMUNITY)
Admission: EM | Admit: 2024-07-04 | Discharge: 2024-07-04 | Disposition: A | Discharge status: Home / Self Care | Attending: Emergency Medicine | Admitting: Emergency Medicine

## 2024-07-04 DIAGNOSIS — E11649 Type 2 diabetes mellitus with hypoglycemia without coma: Secondary | ICD-10-CM | POA: Diagnosis present

## 2024-07-04 DIAGNOSIS — R7881 Bacteremia: Secondary | ICD-10-CM | POA: Diagnosis not present

## 2024-07-04 DIAGNOSIS — B957 Other staphylococcus as the cause of diseases classified elsewhere: Secondary | ICD-10-CM | POA: Insufficient documentation

## 2024-07-04 DIAGNOSIS — Z79899 Other long term (current) drug therapy: Secondary | ICD-10-CM | POA: Diagnosis not present

## 2024-07-04 DIAGNOSIS — B9562 Methicillin resistant Staphylococcus aureus infection as the cause of diseases classified elsewhere: Secondary | ICD-10-CM | POA: Insufficient documentation

## 2024-07-04 DIAGNOSIS — Z960 Presence of urogenital implants: Secondary | ICD-10-CM | POA: Diagnosis not present

## 2024-07-04 DIAGNOSIS — E162 Hypoglycemia, unspecified: Secondary | ICD-10-CM

## 2024-07-04 DIAGNOSIS — F0390 Unspecified dementia without behavioral disturbance: Secondary | ICD-10-CM | POA: Diagnosis not present

## 2024-07-04 DIAGNOSIS — Z7984 Long term (current) use of oral hypoglycemic drugs: Secondary | ICD-10-CM | POA: Diagnosis not present

## 2024-07-04 DIAGNOSIS — Z794 Long term (current) use of insulin: Secondary | ICD-10-CM | POA: Insufficient documentation

## 2024-07-04 DIAGNOSIS — N390 Urinary tract infection, site not specified: Secondary | ICD-10-CM | POA: Diagnosis not present

## 2024-07-04 DIAGNOSIS — R0902 Hypoxemia: Secondary | ICD-10-CM | POA: Insufficient documentation

## 2024-07-04 LAB — URINALYSIS, ROUTINE W REFLEX MICROSCOPIC
Bacteria, UA: NONE SEEN
Bilirubin Urine: NEGATIVE
Glucose, UA: 150 mg/dL — AB
Ketones, ur: NEGATIVE mg/dL
Nitrite: NEGATIVE
Protein, ur: 30 mg/dL — AB
Specific Gravity, Urine: 1.017 (ref 1.005–1.030)
WBC, UA: >50 WBC/hpf (ref 0–5)
pH: 5 (ref 5.0–8.0)

## 2024-07-04 LAB — CBC WITH DIFFERENTIAL/PLATELET
Abs Immature Granulocytes: 0.01 K/uL (ref 0.00–0.07)
Basophils Absolute: 0 K/uL (ref 0.0–0.1)
Basophils Relative: 0 %
Eosinophils Absolute: 0 K/uL (ref 0.0–0.5)
Eosinophils Relative: 1 %
HCT: 34.8 % — ABNORMAL LOW (ref 36.0–46.0)
Hemoglobin: 11 g/dL — ABNORMAL LOW (ref 12.0–15.0)
Immature Granulocytes: 0 %
Lymphocytes Relative: 17 %
Lymphs Abs: 0.6 K/uL — ABNORMAL LOW (ref 0.7–4.0)
MCH: 31.3 pg (ref 26.0–34.0)
MCHC: 31.6 g/dL (ref 30.0–36.0)
MCV: 98.9 fL (ref 80.0–100.0)
Monocytes Absolute: 0.2 K/uL (ref 0.1–1.0)
Monocytes Relative: 7 %
Neutro Abs: 2.4 K/uL (ref 1.7–7.7)
Neutrophils Relative %: 75 %
Platelets: 252 K/uL (ref 150–400)
RBC: 3.52 MIL/uL — ABNORMAL LOW (ref 3.87–5.11)
RDW: 13.8 % (ref 11.5–15.5)
WBC: 3.3 K/uL — ABNORMAL LOW (ref 4.0–10.5)
nRBC: 0 % (ref 0.0–0.2)

## 2024-07-04 LAB — CBG MONITORING, ED
Glucose-Capillary: 53 mg/dL — ABNORMAL LOW (ref 70–99)
Glucose-Capillary: 113 mg/dL — ABNORMAL HIGH (ref 70–99)
Glucose-Capillary: 124 mg/dL — ABNORMAL HIGH (ref 70–99)
Glucose-Capillary: 223 mg/dL — ABNORMAL HIGH (ref 70–99)
Glucose-Capillary: 236 mg/dL — ABNORMAL HIGH (ref 70–99)

## 2024-07-04 LAB — I-STAT CHEM 8, ED
BUN: 19 mg/dL (ref 8–23)
Calcium, Ion: 1.13 mmol/L — ABNORMAL LOW (ref 1.15–1.40)
Chloride: 100 mmol/L (ref 98–111)
Creatinine, Ser: 0.8 mg/dL (ref 0.44–1.00)
Glucose, Bld: 222 mg/dL — ABNORMAL HIGH (ref 70–99)
HCT: 34 % — ABNORMAL LOW (ref 36.0–46.0)
Hemoglobin: 11.6 g/dL — ABNORMAL LOW (ref 12.0–15.0)
Potassium: 3.8 mmol/L (ref 3.5–5.1)
Sodium: 138 mmol/L (ref 135–145)
TCO2: 26 mmol/L (ref 22–32)

## 2024-07-04 LAB — I-STAT CG4 LACTIC ACID, ED: Lactic Acid, Venous: 1 mmol/L (ref 0.5–1.9)

## 2024-07-04 MED ORDER — DEXTROSE 50 % IV SOLN: 25.0000 g | INTRAVENOUS | Status: AC

## 2024-07-04 MED ORDER — DEXTROSE 50 % IV SOLN
INTRAVENOUS | Status: AC
Start: 2024-07-04 — End: 2024-07-04
  Administered 2024-07-04: 25 g via INTRAVENOUS
  Filled 2024-07-04: qty 50

## 2024-07-04 MED ORDER — LANTUS SOLOSTAR 100 UNIT/ML ~~LOC~~ SOPN: 6.0000 [IU] | PEN_INJECTOR | Freq: Every day | SUBCUTANEOUS | 0 refills | Status: DC

## 2024-07-04 NOTE — ED Notes (Signed)
 Pt contact notified of pt transport being called

## 2024-07-04 NOTE — Discharge Instructions (Addendum)
 Decrease the insulin  back down to 6 units.  Follow-up with her doctor and they may want to add some more coverage for episodic treatment if the sugar goes high.  Watch for signs of infection.  Follow-up with her doctor.

## 2024-07-04 NOTE — ED Notes (Addendum)
 Pt contracture in left arm and unable to straighten. Therefore, unable to obtain a second IV or second set of cultures

## 2024-07-04 NOTE — ED Notes (Signed)
 Warming blanket placed on pt.

## 2024-07-04 NOTE — ED Provider Notes (Signed)
 [place] EMERGENCY DEPARTMENT AT [facility] Provider Note   CSN: [id number] Arrival date & time: 07/04/24  1108     Patient presents with: Hypoglycemia   Hayley Fisher is a 89 y.o. female.    Hypoglycemia Patient brought in for decreased responsiveness.  Reportedly found to have a sugar of 53 for EMS.  Does have baseline dementia with reported alert and oriented x 1 at baseline.  Sugar to 53 for EMS and was given D10 with improvement up to 110.  However upon arrival to ER here back down to 53.  Complaining only of some rear-end pain.  Does have chronic Foley catheter.  Recently treated for UTI.     Prior to Admission medications   Medication Sig Start Date End Date Taking? Authorizing Provider  acetaminophen  (TYLENOL ) 500 MG tablet Take 2 tablets (1,000 mg total) by mouth every 8 (eight) hours. 04/26/24   McDiarmid, Krystal BIRCH, MD  amLODipine  (NORVASC ) 5 MG tablet Take 1 tablet (5 mg total) by mouth daily. 05/28/24   Baker, Raguel MATSU, DO  atorvastatin  (LIPITOR) 40 MG tablet Take 1 tablet (40 mg total) by mouth daily. 04/03/24   Lennie Raguel MATSU, DO  brimonidine  (ALPHAGAN ) 0.2 % ophthalmic solution Place 1 drop into the left eye 2 (two) times daily. 03/30/21   [provider]  Control Gel Formula Dressing (DUODERM CGF EXTRA THIN) MISC Apply 1 each topically every three (3) days as needed (Change every 3-5 days, or sooner if soiled.). 03/06/24   Lafe Domino, DO  dorzolamide -timolol  (COSOPT ) 22.3-6.8 MG/ML ophthalmic solution Place 1 drop into both eyes 2 (two) times daily. 05/18/20   [provider]  famotidine  (PEPCID ) 20 MG tablet Take 40 mg by mouth daily as needed for heartburn or indigestion. 04/01/24   [provider]  insulin  glargine (LANTUS  SOLOSTAR) 100 UNIT/ML Solostar Pen Inject 6 Units into the skin daily. 07/04/24   Patsey Lot, MD  levothyroxine  (SYNTHROID ) 50 MCG tablet Take 1 tablet (50 mcg total) by mouth daily. 05/22/24    Shamleffer, Ibtehal Jaralla, MD  metFORMIN  (GLUCOPHAGE -XR) 500 MG 24 hr tablet Take 2 tablets (1,000 mg total) by mouth daily with breakfast. 05/22/24   Shamleffer, Ibtehal Jaralla, MD  metoprolol  succinate (TOPROL -XL) 25 MG 24 hr tablet Take 1 tablet (25 mg total) by mouth daily. 05/30/24   Baker, Raguel MATSU, DO  mirtazapine (REMERON) 15 MG tablet Take 1 tablet (15 mg total) by mouth at bedtime. 06/13/24   Baker, Raguel MATSU, DO  MYRBETRIQ  25 MG TB24 tablet Take 1 tablet (25 mg total) by mouth daily. 06/25/24   Majeed, Camie, DO  polyethylene glycol powder (GLYCOLAX /MIRALAX ) 17 GM/SCOOP powder Take 17 g by mouth in the morning and at bedtime. Patient taking differently: Take 17 g by mouth every other day. 08/20/21   Jagadish, Mayuri, MD  senna (SENOKOT) 8.6 MG TABS tablet Take 1 tablet (8.6 mg total) by mouth 2 (two) times daily. Patient taking differently: Take 1 tablet by mouth daily as needed for mild constipation. 08/09/21   Marlee Lynwood NOVAK, MD  tamsulosin  (FLOMAX ) 0.4 MG CAPS capsule Take 0.4 mg by mouth daily. 06/13/24   [provider]  traZODone  (DESYREL ) 100 MG tablet Take 1 tablet (100 mg total) by mouth at bedtime as needed for sleep. 06/06/24   Lennie Raguel MATSU, DO    Allergies: Doxepin  hcl, Rexulti  [brexpiprazole ], Levemir  [insulin  detemir], and Tramadol     Review of Systems  Updated Vital Signs BP (!) 142/94  Pulse 85   Temp 98 F (36.7 C)   Resp 16   Ht 5' 2 (1.575 m)   SpO2 98%   BMI 23.23 kg/m   Physical Exam Vitals and nursing note reviewed.  HENT:     Head: Atraumatic.  Cardiovascular:     Rate and Rhythm: Regular rhythm.  Abdominal:     Tenderness: There is no abdominal tenderness.  Neurological:     Mental Status: She is alert. Mental status is at baseline.     (all labs ordered are listed, but only abnormal results are displayed) Labs Reviewed  URINALYSIS, ROUTINE W REFLEX MICROSCOPIC - Abnormal; Notable for the following components:      Result  Value   APPearance HAZY (*)    Glucose, UA 150 (*)    Hgb urine dipstick SMALL (*)    Protein, ur 30 (*)    Leukocytes,Ua LARGE (*)    All other components within normal limits  CBC WITH DIFFERENTIAL/PLATELET - Abnormal; Notable for the following components:   WBC 3.3 (*)    RBC 3.52 (*)    Hemoglobin 11.0 (*)    HCT 34.8 (*)    Lymphs Abs 0.6 (*)    All other components within normal limits  CBG MONITORING, ED - Abnormal; Notable for the following components:   Glucose-Capillary 53 (*)    All other components within normal limits  I-STAT CHEM 8, ED - Abnormal; Notable for the following components:   Glucose, Bld 222 (*)    Calcium , Ion 1.13 (*)    Hemoglobin 11.6 (*)    HCT 34.0 (*)    All other components within normal limits  CBG MONITORING, ED - Abnormal; Notable for the following components:   Glucose-Capillary 113 (*)    All other components within normal limits  CBG MONITORING, ED - Abnormal; Notable for the following components:   Glucose-Capillary 124 (*)    All other components within normal limits  CULTURE, BLOOD (ROUTINE X 2)  CULTURE, BLOOD (ROUTINE X 2)  I-STAT CG4 LACTIC ACID, ED  I-STAT CG4 LACTIC ACID, ED    EKG: None  Radiology: No results found.   Procedures   Medications Ordered in the ED  dextrose  50 % solution 25 g (25 g Intravenous Given 07/04/24 1139)                                    Medical Decision Making Amount and/or Complexity of Data Reviewed Labs: ordered.  Risk Prescription drug management.   Patient with hyperglycemia.  History of diabetes.  Unsure if she has eaten today.  Has recurrent hypoglycemia after D10.  Will now feed.  Has had D50 here.  Reviewed recent PCP note as had UTI and treated with Cipro  due to cultures.  Blood work overall reassuring.  White count mildly low but similar to previous episodes.  Lactic acid normal.  Urine does show some white cells but no bacteria.  Has chronic Foley.  Hypothermia  improving.  Feeling better.  Sugars stayed up and has eaten.  Further discussion with daughter.  Had had previously 10 units of her insulin  and then decreased down to 8 and then down to 6 because of hypoglycemic episodes.  Had hyperglycemic episodes on 6 so increased back up.  I think with the hypoglycemia potentially will need decrease of insulin  again.  Maybe would need a sliding scale but will let PCP  decide on top of that. Had questionable hypoxia per nursing.  Had started on oxygen.  Will attempt to titrate off.  Patient's daughter states there is no large wound in her buttock area.      Final diagnoses:  Hypoglycemia    ED Discharge Orders          Ordered    insulin  glargine (LANTUS  SOLOSTAR) 100 UNIT/ML Solostar Pen  Daily        07/04/24 1505               Patsey Lot, MD 07/04/24 1505

## 2024-07-04 NOTE — ED Notes (Signed)
 PTAR cancelled at pt family request. Pt family educated on safety risk, still declined transport . Nurse and staff escorted pt out of building and helped pt into pt family car.

## 2024-07-04 NOTE — ED Notes (Signed)
 Bear hugger applied

## 2024-07-04 NOTE — ED Triage Notes (Addendum)
 Note that pt has dementia and her baseline is orientation to person only. Pt family noticed at 830 that she was somnolent and not herself, CBG ws 52. Upon EMS arrival, she only arouses to painful stimuli and CBG was 62. Right AC IV, given 18mg  (150 cc) of D10. CBG then approved to 113 and she then started to open eyes and converse w family. Pt presents w pain to tailbone d/t being bed bound.   EMS vitals   BP 164/96  HR 86 Sinus arrhythmia  SPO2 96 RA  CBG 53 currently

## 2024-07-04 NOTE — ED Notes (Signed)
Bair Hugger turned off.

## 2024-07-04 NOTE — ED Provider Notes (Signed)
  Physical Exam  BP (!) 142/94   Pulse 85   Temp 98 F (36.7 C)   Resp 16   Ht 5' 2 (1.575 m)   SpO2 98%   BMI 23.23 kg/m   Physical Exam  Procedures  Procedures  ED Course / MDM    Medical Decision Making Care assumed at 3 pm.  Patient is here with hypoglycemia.  Patient's blood sugar has been stable for the last several hours.  Patient has a Foley catheter and likely has contamination.  Questionable oxygen saturation initially.  Patient taken off oxygen and oxygen remained above 95%.  Previous provider decreased the insulin  regiment.  Patient will need to follow-up with family practice  Problems Addressed: Hypoglycemia: acute illness or injury  Amount and/or Complexity of Data Reviewed Labs: ordered.  Risk Prescription drug management.          Patt Alm Macho, MD 07/04/24 [phone]

## 2024-07-04 NOTE — Inpatient Diabetes Management (Signed)
 Inpatient Diabetes Program Recommendations  AACE/ADA: New Consensus Statement on Inpatient Glycemic Control (2015)  Target Ranges:  Prepandial:   less than 140 mg/dL      Peak postprandial:   less than 180 mg/dL (1-2 hours)      Critically ill patients:  140 - 180 mg/dL   Lab Results  Component Value Date   GLUCAP 124 (H) 07/04/2024   HGBA1C 7.1 (H) 04/05/2024    Review of Glycemic Control  Latest Reference Range & Units 07/04/24 11:27 07/04/24 12:28 07/04/24 14:35  Glucose-Capillary 70 - 99 mg/dL 53 (L) 886 (H) 875 (H)  (L): Data is abnormally low (H): Data is abnormally high Diabetes history: Type 2 DM Outpatient Diabetes medications: Lantus  8 units every day, Metformin  1000 mg BID Current orders for Inpatient glycemic control: None  Inpatient Diabetes Program Recommendations:    Hypoglycemia on presentation. Dextrose  added. Consider adding CBGs 2H.   Thanks, Tinnie Minus, MSN, RNC-OB Diabetes Coordinator [phone] (8a-5p)

## 2024-07-05 LAB — BLOOD CULTURE ID PANEL (REFLEXED) - BCID2

## 2024-07-05 NOTE — ED Notes (Signed)
 07/05/24 1305 spoke with Lonell, pts daughter, who reports mother is doing well other than he blood sugar a little high, no other reported problems. Explained + BC, if pt has any fever, chills, N/V or other symptoms to please follow up wit pts PCP or return to the ED. Jackie verbalizes understanding.

## 2024-07-07 LAB — CULTURE, BLOOD (ROUTINE X 2)

## 2024-07-08 ENCOUNTER — Emergency Department (HOSPITAL_COMMUNITY)

## 2024-07-08 ENCOUNTER — Encounter (HOSPITAL_COMMUNITY): Payer: Self-pay | Admitting: Emergency Medicine

## 2024-07-08 ENCOUNTER — Other Ambulatory Visit: Payer: Self-pay

## 2024-07-08 ENCOUNTER — Observation Stay (HOSPITAL_COMMUNITY)
Admission: EM | Admit: 2024-07-08 | Discharge: 2024-07-11 | Disposition: A | Discharge status: Home / Self Care | Attending: Emergency Medicine | Admitting: Emergency Medicine

## 2024-07-08 DIAGNOSIS — N390 Urinary tract infection, site not specified: Secondary | ICD-10-CM | POA: Insufficient documentation

## 2024-07-08 DIAGNOSIS — Z9359 Other cystostomy status: Secondary | ICD-10-CM

## 2024-07-08 DIAGNOSIS — E1165 Type 2 diabetes mellitus with hyperglycemia: Secondary | ICD-10-CM | POA: Insufficient documentation

## 2024-07-08 DIAGNOSIS — F03C18 Unspecified dementia, severe, with other behavioral disturbance: Secondary | ICD-10-CM | POA: Diagnosis not present

## 2024-07-08 DIAGNOSIS — Z79899 Other long term (current) drug therapy: Secondary | ICD-10-CM | POA: Diagnosis not present

## 2024-07-08 DIAGNOSIS — K5900 Constipation, unspecified: Secondary | ICD-10-CM | POA: Diagnosis not present

## 2024-07-08 DIAGNOSIS — D649 Anemia, unspecified: Secondary | ICD-10-CM | POA: Diagnosis not present

## 2024-07-08 DIAGNOSIS — E039 Hypothyroidism, unspecified: Secondary | ICD-10-CM | POA: Diagnosis present

## 2024-07-08 DIAGNOSIS — L89322 Pressure ulcer of left buttock, stage 2: Secondary | ICD-10-CM | POA: Insufficient documentation

## 2024-07-08 DIAGNOSIS — I10 Essential (primary) hypertension: Secondary | ICD-10-CM | POA: Insufficient documentation

## 2024-07-08 DIAGNOSIS — Z794 Long term (current) use of insulin: Secondary | ICD-10-CM | POA: Diagnosis not present

## 2024-07-08 DIAGNOSIS — R7881 Bacteremia: Principal | ICD-10-CM | POA: Insufficient documentation

## 2024-07-08 DIAGNOSIS — L089 Local infection of the skin and subcutaneous tissue, unspecified: Secondary | ICD-10-CM | POA: Diagnosis present

## 2024-07-08 DIAGNOSIS — H409 Unspecified glaucoma: Secondary | ICD-10-CM | POA: Diagnosis not present

## 2024-07-08 DIAGNOSIS — E785 Hyperlipidemia, unspecified: Secondary | ICD-10-CM | POA: Diagnosis not present

## 2024-07-08 DIAGNOSIS — E1169 Type 2 diabetes mellitus with other specified complication: Secondary | ICD-10-CM | POA: Diagnosis present

## 2024-07-08 DIAGNOSIS — L89159 Pressure ulcer of sacral region, unspecified stage: Secondary | ICD-10-CM | POA: Diagnosis present

## 2024-07-08 DIAGNOSIS — G4700 Insomnia, unspecified: Secondary | ICD-10-CM | POA: Diagnosis present

## 2024-07-08 DIAGNOSIS — E1159 Type 2 diabetes mellitus with other circulatory complications: Secondary | ICD-10-CM | POA: Diagnosis present

## 2024-07-08 LAB — URINALYSIS, W/ REFLEX TO CULTURE (INFECTION SUSPECTED)
Bilirubin Urine: NEGATIVE
Glucose, UA: NEGATIVE mg/dL
Ketones, ur: NEGATIVE mg/dL
Nitrite: NEGATIVE
Protein, ur: 100 mg/dL — AB
Specific Gravity, Urine: 1.016 (ref 1.005–1.030)
WBC, UA: >50 WBC/hpf (ref 0–5)
pH: 6 (ref 5.0–8.0)

## 2024-07-08 LAB — COMPREHENSIVE METABOLIC PANEL WITH GFR
ALT: 6 U/L (ref 0–44)
AST: 18 U/L (ref 15–41)
Albumin: 3.8 g/dL (ref 3.5–5.0)
Alkaline Phosphatase: 47 U/L (ref 38–126)
Anion gap: 8 (ref 5–15)
BUN: 17 mg/dL (ref 8–23)
CO2: 29 mmol/L (ref 22–32)
Calcium: 8.8 mg/dL — ABNORMAL LOW (ref 8.9–10.3)
Chloride: 102 mmol/L (ref 98–111)
Creatinine, Ser: 0.62 mg/dL (ref 0.44–1.00)
GFR, Estimated: >60 mL/min (ref >60)
Glucose, Bld: 107 mg/dL — ABNORMAL HIGH (ref 70–99)
Potassium: 4 mmol/L (ref 3.5–5.1)
Sodium: 139 mmol/L (ref 135–145)
Total Bilirubin: 0.5 mg/dL (ref 0.0–1.2)
Total Protein: 6.3 g/dL — ABNORMAL LOW (ref 6.5–8.1)

## 2024-07-08 LAB — CBC WITH DIFFERENTIAL/PLATELET
Abs Immature Granulocytes: 0.02 K/uL (ref 0.00–0.07)
Basophils Absolute: 0 K/uL (ref 0.0–0.1)
Basophils Relative: 0 %
Eosinophils Absolute: 0.1 K/uL (ref 0.0–0.5)
Eosinophils Relative: 2 %
HCT: 33 % — ABNORMAL LOW (ref 36.0–46.0)
Hemoglobin: 10.6 g/dL — ABNORMAL LOW (ref 12.0–15.0)
Immature Granulocytes: 0 %
Lymphocytes Relative: 14 %
Lymphs Abs: 0.7 K/uL (ref 0.7–4.0)
MCH: 31.7 pg (ref 26.0–34.0)
MCHC: 32.1 g/dL (ref 30.0–36.0)
MCV: 98.8 fL (ref 80.0–100.0)
Monocytes Absolute: 0.3 K/uL (ref 0.1–1.0)
Monocytes Relative: 6 %
Neutro Abs: 3.8 K/uL (ref 1.7–7.7)
Neutrophils Relative %: 78 %
Platelets: 209 K/uL (ref 150–400)
RBC: 3.34 MIL/uL — ABNORMAL LOW (ref 3.87–5.11)
RDW: 13.9 % (ref 11.5–15.5)
WBC: 4.8 K/uL (ref 4.0–10.5)
nRBC: 0 % (ref 0.0–0.2)

## 2024-07-08 LAB — I-STAT CG4 LACTIC ACID, ED: Lactic Acid, Venous: 0.9 mmol/L (ref 0.5–1.9)

## 2024-07-08 LAB — GLUCOSE, CAPILLARY
Glucose-Capillary: 114 mg/dL — ABNORMAL HIGH (ref 70–99)
Glucose-Capillary: 162 mg/dL — ABNORMAL HIGH (ref 70–99)

## 2024-07-08 MED ORDER — INSULIN GLARGINE-YFGN 100 UNIT/ML ~~LOC~~ SOLN
6.0000 [IU] | Freq: Every day | SUBCUTANEOUS | Status: DC
  Administered 2024-07-09 – 2024-07-11 (×3): 6 [IU] via SUBCUTANEOUS
  Filled 2024-07-08 (×3): qty 0.06

## 2024-07-08 MED ORDER — ONDANSETRON HCL 4 MG PO TABS: 4.0000 mg | ORAL_TABLET | Freq: Four times a day (QID) | ORAL | Status: DC | PRN

## 2024-07-08 MED ORDER — SODIUM CHLORIDE 0.9 % IV SOLN: INTRAVENOUS | Status: DC

## 2024-07-08 MED ORDER — ONDANSETRON HCL 4 MG/2ML IJ SOLN: 4.0000 mg | Freq: Four times a day (QID) | INTRAMUSCULAR | Status: DC | PRN

## 2024-07-08 MED ORDER — METOPROLOL SUCCINATE ER 25 MG PO TB24
25.0000 mg | ORAL_TABLET | Freq: Every day | ORAL | Status: DC
  Administered 2024-07-08 – 2024-07-11 (×4): 25 mg via ORAL
  Filled 2024-07-08 (×4): qty 1

## 2024-07-08 MED ORDER — INSULIN ASPART 100 UNIT/ML IJ SOLN
0.0000 [IU] | Freq: Three times a day (TID) | INTRAMUSCULAR | Status: DC
  Administered 2024-07-09: 3 [IU] via SUBCUTANEOUS
  Administered 2024-07-09 (×2): 2 [IU] via SUBCUTANEOUS
  Administered 2024-07-10: 1 [IU] via SUBCUTANEOUS
  Administered 2024-07-10: 2 [IU] via SUBCUTANEOUS
  Administered 2024-07-10: 3 [IU] via SUBCUTANEOUS
  Administered 2024-07-11: 1 [IU] via SUBCUTANEOUS
  Filled 2024-07-08 (×2): qty 2
  Filled 2024-07-08: qty 1
  Filled 2024-07-08: qty 3

## 2024-07-08 MED ORDER — AMLODIPINE BESYLATE 5 MG PO TABS
5.0000 mg | ORAL_TABLET | Freq: Every day | ORAL | Status: DC
  Administered 2024-07-08 – 2024-07-11 (×4): 5 mg via ORAL
  Filled 2024-07-08 (×4): qty 1

## 2024-07-08 MED ORDER — HALOPERIDOL LACTATE 5 MG/ML IJ SOLN
2.0000 mg | Freq: Four times a day (QID) | INTRAMUSCULAR | Status: DC | PRN
  Administered 2024-07-08: 2 mg via INTRAVENOUS
  Filled 2024-07-08: qty 1

## 2024-07-08 MED ORDER — ACETAMINOPHEN 325 MG PO TABS
650.0000 mg | ORAL_TABLET | Freq: Four times a day (QID) | ORAL | Status: DC | PRN
  Administered 2024-07-08 – 2024-07-10 (×3): 650 mg via ORAL
  Filled 2024-07-08 (×3): qty 2

## 2024-07-08 MED ORDER — MIRTAZAPINE 15 MG PO TABS: 15.0000 mg | ORAL_TABLET | Freq: Every day | ORAL | Status: DC

## 2024-07-08 MED ORDER — VANCOMYCIN HCL IN DEXTROSE 1-5 GM/200ML-% IV SOLN
1000.0000 mg | Freq: Once | INTRAVENOUS | Status: AC
  Administered 2024-07-08: 1000 mg via INTRAVENOUS
  Filled 2024-07-08: qty 200

## 2024-07-08 MED ORDER — TRAZODONE HCL 100 MG PO TABS
100.0000 mg | ORAL_TABLET | Freq: Every evening | ORAL | Status: DC | PRN
  Administered 2024-07-10: 100 mg via ORAL
  Filled 2024-07-08: qty 1

## 2024-07-08 MED ORDER — LEVOTHYROXINE SODIUM 50 MCG PO TABS
50.0000 ug | ORAL_TABLET | Freq: Every day | ORAL | Status: DC
  Administered 2024-07-09 – 2024-07-11 (×3): 50 ug via ORAL
  Filled 2024-07-08 (×3): qty 1

## 2024-07-08 MED ORDER — SENNA 8.6 MG PO TABS: 1.0000 | ORAL_TABLET | Freq: Every day | ORAL | Status: DC | PRN

## 2024-07-08 MED ORDER — ATORVASTATIN CALCIUM 40 MG PO TABS
40.0000 mg | ORAL_TABLET | Freq: Every day | ORAL | Status: DC
  Administered 2024-07-09 – 2024-07-11 (×3): 40 mg via ORAL
  Filled 2024-07-08 (×3): qty 1

## 2024-07-08 MED ORDER — BRIMONIDINE TARTRATE 0.2 % OP SOLN
1.0000 [drp] | Freq: Two times a day (BID) | OPHTHALMIC | Status: DC
  Administered 2024-07-08 – 2024-07-11 (×6): 1 [drp] via OPHTHALMIC
  Filled 2024-07-08: qty 5

## 2024-07-08 MED ORDER — ENOXAPARIN SODIUM 40 MG/0.4ML IJ SOSY
40.0000 mg | PREFILLED_SYRINGE | INTRAMUSCULAR | Status: DC
  Administered 2024-07-08 – 2024-07-10 (×3): 40 mg via SUBCUTANEOUS
  Filled 2024-07-08 (×3): qty 0.4

## 2024-07-08 MED ORDER — TAMSULOSIN HCL 0.4 MG PO CAPS
0.4000 mg | ORAL_CAPSULE | Freq: Every day | ORAL | Status: DC
  Administered 2024-07-08 – 2024-07-11 (×4): 0.4 mg via ORAL
  Filled 2024-07-08 (×4): qty 1

## 2024-07-08 MED ORDER — FAMOTIDINE 20 MG PO TABS: 40.0000 mg | ORAL_TABLET | Freq: Every day | ORAL | Status: DC | PRN

## 2024-07-08 MED ORDER — POLYETHYLENE GLYCOL 3350 17 GM/SCOOP PO POWD
17.0000 g | ORAL | Status: DC
  Administered 2024-07-10: 17 g via ORAL
  Filled 2024-07-08: qty 119

## 2024-07-08 MED ORDER — METFORMIN HCL ER 500 MG PO TB24
1000.0000 mg | ORAL_TABLET | Freq: Every day | ORAL | Status: DC
  Administered 2024-07-09 – 2024-07-11 (×3): 1000 mg via ORAL
  Filled 2024-07-08 (×3): qty 2

## 2024-07-08 MED ORDER — MIRABEGRON ER 25 MG PO TB24
25.0000 mg | ORAL_TABLET | Freq: Every day | ORAL | Status: DC
  Administered 2024-07-08 – 2024-07-11 (×4): 25 mg via ORAL
  Filled 2024-07-08 (×4): qty 1

## 2024-07-08 MED ORDER — DORZOLAMIDE HCL-TIMOLOL MAL 2-0.5 % OP SOLN
1.0000 [drp] | Freq: Two times a day (BID) | OPHTHALMIC | Status: DC
  Administered 2024-07-08 – 2024-07-11 (×6): 1 [drp] via OPHTHALMIC
  Filled 2024-07-08: qty 10

## 2024-07-08 MED ORDER — ACETAMINOPHEN 650 MG RE SUPP
650.0000 mg | Freq: Four times a day (QID) | RECTAL | Status: DC | PRN
Start: 2024-07-08 — End: 2024-07-11

## 2024-07-08 MED ORDER — SODIUM CHLORIDE 0.9 % IV SOLN
2.0000 g | Freq: Two times a day (BID) | INTRAVENOUS | Status: DC
  Administered 2024-07-08 – 2024-07-09 (×2): 2 g via INTRAVENOUS
  Filled 2024-07-08 (×2): qty 12.5

## 2024-07-08 NOTE — ED Provider Notes (Signed)
 [place] EMERGENCY DEPARTMENT AT [facility] Provider Note   CSN: [id number] Arrival date & time: 07/08/24  1128     Patient presents with: Wound Infection   Hayley Fisher is a 89 y.o. female.   Patient seen November 13.  Had blood cultures done patient was hypoglycemic.  Patient's cultures ended up growing staph epidermis.  Methicillin resistant.  Patient pretty much bedridden has a longstanding sacral decubitus.  Patient's been somewhat baseline other than her p.o. intake has been decreased.  She does get around with a wheelchair.  No nausea vomiting or diarrhea.  No fevers.  Temp here is 98.3 pulse 92 respiration 16 blood pressure 158/89 oxygen sats 94% on room air.  Patient has suprapubic catheter.  Normally is changed every month due to be changed tomorrow.       Prior to Admission medications   Medication Sig Start Date End Date Taking? Authorizing Provider  acetaminophen  (TYLENOL ) 500 MG tablet Take 2 tablets (1,000 mg total) by mouth every 8 (eight) hours. 04/26/24   McDiarmid, Krystal BIRCH, MD  amLODipine  (NORVASC ) 5 MG tablet Take 1 tablet (5 mg total) by mouth daily. 05/28/24   Baker, Raguel MATSU, DO  atorvastatin  (LIPITOR) 40 MG tablet Take 1 tablet (40 mg total) by mouth daily. 04/03/24   Lennie Raguel MATSU, DO  brimonidine  (ALPHAGAN ) 0.2 % ophthalmic solution Place 1 drop into the left eye 2 (two) times daily. 03/30/21   [provider]  Control Gel Formula Dressing (DUODERM CGF EXTRA THIN) MISC Apply 1 each topically every three (3) days as needed (Change every 3-5 days, or sooner if soiled.). 03/06/24   Lafe Domino, DO  dorzolamide -timolol  (COSOPT ) 22.3-6.8 MG/ML ophthalmic solution Place 1 drop into both eyes 2 (two) times daily. 05/18/20   [provider]  famotidine  (PEPCID ) 20 MG tablet Take 40 mg by mouth daily as needed for heartburn or indigestion. 04/01/24   [provider]  insulin  glargine (LANTUS  SOLOSTAR) 100 UNIT/ML Solostar Pen Inject  6 Units into the skin daily. 07/04/24   Patsey Lot, MD  levothyroxine  (SYNTHROID ) 50 MCG tablet Take 1 tablet (50 mcg total) by mouth daily. 05/22/24   Shamleffer, Ibtehal Jaralla, MD  metFORMIN  (GLUCOPHAGE -XR) 500 MG 24 hr tablet Take 2 tablets (1,000 mg total) by mouth daily with breakfast. 05/22/24   Shamleffer, Ibtehal Jaralla, MD  metoprolol  succinate (TOPROL -XL) 25 MG 24 hr tablet Take 1 tablet (25 mg total) by mouth daily. 05/30/24   Baker, Raguel MATSU, DO  mirtazapine (REMERON) 15 MG tablet Take 1 tablet (15 mg total) by mouth at bedtime. 06/13/24   Baker, Raguel MATSU, DO  MYRBETRIQ  25 MG TB24 tablet Take 1 tablet (25 mg total) by mouth daily. 06/25/24   Majeed, Camie, DO  polyethylene glycol powder (GLYCOLAX /MIRALAX ) 17 GM/SCOOP powder Take 17 g by mouth in the morning and at bedtime. Patient taking differently: Take 17 g by mouth every other day. 08/20/21   Jagadish, Mayuri, MD  senna (SENOKOT) 8.6 MG TABS tablet Take 1 tablet (8.6 mg total) by mouth 2 (two) times daily. Patient taking differently: Take 1 tablet by mouth daily as needed for mild constipation. 08/09/21   Marlee Lynwood NOVAK, MD  tamsulosin  (FLOMAX ) 0.4 MG CAPS capsule Take 0.4 mg by mouth daily. 06/13/24   [provider]  traZODone  (DESYREL ) 100 MG tablet Take 1 tablet (100 mg total) by mouth at bedtime as needed for sleep. 06/06/24   Lennie Raguel MATSU, DO    Allergies: Doxepin   hcl, Rexulti  [brexpiprazole ], Levemir  [insulin  detemir], and Tramadol     Review of Systems  Constitutional:  Positive for appetite change. Negative for chills and fever.  Respiratory:  Negative for cough.   Gastrointestinal:  Negative for vomiting.  Skin:  Negative for color change and rash.  All other systems reviewed and are negative.   Updated Vital Signs BP (!) 150/83 (BP Location: Right Arm)   Pulse 88   Temp 98.3 F (36.8 C) (Oral)   Resp 16   SpO2 96%   Physical Exam Vitals and nursing note reviewed.  Constitutional:       General: She is not in acute distress.    Appearance: Normal appearance. She is well-developed. She is not ill-appearing.  HENT:     Head: Normocephalic and atraumatic.  Eyes:     Extraocular Movements: Extraocular movements intact.     Conjunctiva/sclera: Conjunctivae normal.     Pupils: Pupils are equal, round, and reactive to light.  Cardiovascular:     Rate and Rhythm: Normal rate and regular rhythm.     Heart sounds: No murmur heard. Pulmonary:     Effort: Pulmonary effort is normal. No respiratory distress.     Breath sounds: Normal breath sounds.  Abdominal:     Palpations: Abdomen is soft.     Tenderness: There is no abdominal tenderness.     Comments: Suprapubic catheter in place.  Urine very cloudy.  Musculoskeletal:        General: No swelling.     Cervical back: Neck supple.     Comments: Sacral wound area with slight opening.  No significant discharge.  Almost completely healed.  Skin:    General: Skin is warm and dry.     Capillary Refill: Capillary refill takes less than 2 seconds.  Neurological:     Mental Status: She is alert. Mental status is at baseline.  Psychiatric:        Mood and Affect: Mood normal.     (all labs ordered are listed, but only abnormal results are displayed) Labs Reviewed  CBC WITH DIFFERENTIAL/PLATELET - Abnormal; Notable for the following components:      Result Value   RBC 3.34 (*)    Hemoglobin 10.6 (*)    HCT 33.0 (*)    All other components within normal limits  COMPREHENSIVE METABOLIC PANEL WITH GFR - Abnormal; Notable for the following components:   Glucose, Bld 107 (*)    Calcium  8.8 (*)    Total Protein 6.3 (*)    All other components within normal limits  CULTURE, BLOOD (ROUTINE X 2)  CULTURE, BLOOD (ROUTINE X 2)  URINALYSIS, W/ REFLEX TO CULTURE (INFECTION SUSPECTED)  I-STAT CG4 LACTIC ACID, ED  I-STAT CG4 LACTIC ACID, ED    EKG: EKG Interpretation Date/Time:  Monday July 08 2024 13:35:56 EST Ventricular  Rate:  89 PR Interval:  199 QRS Duration:  85 QT Interval:  370 QTC Calculation: 451 R Axis:   -44  Text Interpretation: Sinus rhythm Ventricular premature complex Left axis deviation Anterior infarct, old No significant change since last tracing Confirmed by Ulysee Fyock [phone]) on 07/08/2024 2:19:30 PM  Radiology: [facility] Chest Port 1 View Result Date: 07/08/2024 CLINICAL DATA:  Sacral wound.  Bacteremia. EXAM: PORTABLE CHEST 1 VIEW COMPARISON:  04/03/2024. FINDINGS: Low lung volumes with bronchovascular crowding, similar to the prior exam. Cardiomediastinal silhouette is unchanged. Left-greater-than-right bibasilar subsegmental atelectasis. No sizable pleural effusion or pneumothorax. No acute osseous abnormality. IMPRESSION: Low lung volumes with bibasilar  atelectasis. Electronically Signed   By: Harrietta Sherry M.D.   On: 07/08/2024 14:56     Procedures   Medications Ordered in the ED  0.9 %  sodium chloride  infusion ( Intravenous New Bag/Given 07/08/24 1356)  vancomycin  (VANCOCIN ) IVPB 1000 mg/200 mL premix (has no administration in time range)                                    Medical Decision Making Amount and/or Complexity of Data Reviewed Labs: ordered. Radiology: ordered.  Risk Prescription drug management.   Patient with Staph epidermidis cultures x 2 from the 13th.  Discussed with hospitalist here will admit.  Give a dose of vancomycin  and have repeated cultures today.  Patient's vital signs without significant toxicity.  White count is 4.8 hemoglobin 10.6 lactic acid 0.9 urinalysis pending.  Patient does have a suprapubic catheter.  Complete metabolic panel normal with normal renal function.  Anion gap 8.  Portable chest x-ray is pending.  Final diagnoses:  Positive blood cultures    ED Discharge Orders     None          Geraldene Hamilton, MD 07/08/24 1500

## 2024-07-08 NOTE — H&P (Signed)
 History and Physical    Patient: Hayley Fisher FMW:[id number] DOB: July 31, 1931 DOA: 07/08/2024 DOS: the patient was seen and examined on 07/08/2024 PCP: Lennie Raguel MATSU, DO  Patient coming from: Home  Chief Complaint:  Chief Complaint  Patient presents with   Wound Infection   HPI: Hayley Fisher is a 89 y.o. female with medical history significant of osteoarthritis, bilateral cataracts, diarrhea, glaucoma, unspecified chronic kidney disease, hyperkalemia, type II DM, hyperlipidemia, colon polyps, hypertension, hypothyroidism, history of metabolic acidosis, history of syncopal episode, urgent continence, history UTI, history of suprapubic catheter, history of DVT status post IVC filter, sacral pressure ulcer who was seen in the emergency department 4 days ago for hyperglycemia.  Blood cultures were done during that visit.  They were positive for Staph epidermidis and Staph hominis and she was called to return to the hospital.  The patient's daughters stated she has been restless with decreased appetite and sometimes is not able to recognize them.  This has been happening for several days. No measured fever, but she has had night sweats.  No nausea, vomiting or diarrhea.  Lab work: CBC showed a white count of 4.8, hemoglobin 10.6 g/dL and platelets 790.  CMP showed a glucose of 107 mg/dL and a total protein of 6.3 g/dL, the rest of the CMP measurements were normal after calcium  was corrected.  Imaging: Portable 1 view chest radiograph showing low lung volumes with bibasilar atelectasis.   ED course: Initial vital signs were temperature 98.3 F, pulse 92, respirations 16, BP 158/89 mmHg and O2 sat 94% on room air.  The patient received vancomycin  1000 mg IVPB x 1.  Review of Systems: As mentioned in the history of present illness. All other systems reviewed and are negative. Past Medical History:  Diagnosis Date   Arthritis    Cataract    bil cateracts removed   Chronic kidney disease     spot on one of my kidneys per pt   Diabetes mellitus    Diarrhea 02/20/2022   Gastroesophageal reflux disease without esophagitis 09/16/2020   Glaucoma    Goals of care, counseling/discussion 09/09/2021   Hyperkalemia 07/02/2020   Hyperlipidemia    Hyperplastic colon polyp    Hypertension    Hypothyroidism 12/09/2020   Leukocytopenia 06/27/2021   Malnutrition of moderate degree 02/23/2021   Metabolic acidosis 07/02/2020   S/P IVC filter 01/16/2021   DVT 12/2020   Syncope    Thyroid  disease    Urge incontinence 05/05/2022   Past Surgical History:  Procedure Laterality Date   ABDOMINAL HYSTERECTOMY     BLADDER SUSPENSION     bladder tacking     COLONOSCOPY     EYE SURGERY     INGUINAL HERNIA REPAIR Right    IR CATHETER TUBE CHANGE  09/15/2022   IR IVC FILTER PLMT / S&I /IMG GUID/MOD SED  01/16/2021   IR US  GUIDE BX ASP/DRAIN  08/02/2022   Social History:  reports that she quit smoking about 43 years ago. Her smoking use included cigarettes. She has been exposed to tobacco smoke. She has never used smokeless tobacco. She reports that she does not drink alcohol  and does not use drugs.  Allergies  Allergen Reactions   Doxepin  Hcl Other (See Comments)    Hallucinations increased   Rexulti  [Brexpiprazole ] Other (See Comments)    Increase agitation   Levemir  [Insulin  Detemir] Itching   Tramadol  Itching and Other (See Comments)    Takes occasionally with Benadryl     Family History  Problem Relation Age of Onset   Colon cancer Mother    Other Father        cerebral hemorrhage   Diabetes Brother    Diabetes Sister        x 2   Bone cancer Daughter    Breast cancer Daughter 45   Esophageal cancer Neg Hx    Rectal cancer Neg Hx    Stomach cancer Neg Hx     Prior to Admission medications   Medication Sig Start Date End Date Taking? Authorizing Provider  acetaminophen  (TYLENOL ) 500 MG tablet Take 2 tablets (1,000 mg total) by mouth every 8 (eight) hours. 04/26/24    McDiarmid, Krystal BIRCH, MD  amLODipine  (NORVASC ) 5 MG tablet Take 1 tablet (5 mg total) by mouth daily. 05/28/24   Baker, Raguel MATSU, DO  atorvastatin  (LIPITOR) 40 MG tablet Take 1 tablet (40 mg total) by mouth daily. 04/03/24   Baker, Raguel MATSU, DO  brimonidine  (ALPHAGAN ) 0.2 % ophthalmic solution Place 1 drop into the left eye 2 (two) times daily. 03/30/21   [provider]  Control Gel Formula Dressing (DUODERM CGF EXTRA THIN) MISC Apply 1 each topically every three (3) days as needed (Change every 3-5 days, or sooner if soiled.). 03/06/24   Lafe Domino, DO  dorzolamide -timolol  (COSOPT ) 22.3-6.8 MG/ML ophthalmic solution Place 1 drop into both eyes 2 (two) times daily. 05/18/20   [provider]  famotidine  (PEPCID ) 20 MG tablet Take 40 mg by mouth daily as needed for heartburn or indigestion. 04/01/24   [provider]  insulin  glargine (LANTUS  SOLOSTAR) 100 UNIT/ML Solostar Pen Inject 6 Units into the skin daily. 07/04/24   Patsey Lot, MD  levothyroxine  (SYNTHROID ) 50 MCG tablet Take 1 tablet (50 mcg total) by mouth daily. 05/22/24   Shamleffer, Ibtehal Jaralla, MD  metFORMIN  (GLUCOPHAGE -XR) 500 MG 24 hr tablet Take 2 tablets (1,000 mg total) by mouth daily with breakfast. 05/22/24   Shamleffer, Ibtehal Jaralla, MD  metoprolol  succinate (TOPROL -XL) 25 MG 24 hr tablet Take 1 tablet (25 mg total) by mouth daily. 05/30/24   Baker, Raguel MATSU, DO  mirtazapine (REMERON) 15 MG tablet Take 1 tablet (15 mg total) by mouth at bedtime. 06/13/24   Baker, Raguel MATSU, DO  MYRBETRIQ  25 MG TB24 tablet Take 1 tablet (25 mg total) by mouth daily. 06/25/24   Majeed, Camie, DO  polyethylene glycol powder (GLYCOLAX /MIRALAX ) 17 GM/SCOOP powder Take 17 g by mouth in the morning and at bedtime. Patient taking differently: Take 17 g by mouth every other day. 08/20/21   Jagadish, Mayuri, MD  senna (SENOKOT) 8.6 MG TABS tablet Take 1 tablet (8.6 mg total) by mouth 2 (two) times daily. Patient taking  differently: Take 1 tablet by mouth daily as needed for mild constipation. 08/09/21   Marlee Lynwood NOVAK, MD  tamsulosin  (FLOMAX ) 0.4 MG CAPS capsule Take 0.4 mg by mouth daily. 06/13/24   [provider]  traZODone  (DESYREL ) 100 MG tablet Take 1 tablet (100 mg total) by mouth at bedtime as needed for sleep. 06/06/24   Lennie Raguel MATSU, DO    Physical Exam: Vitals:   07/08/24 1143 07/08/24 1337  BP: (!) 158/89 (!) 150/83  Pulse: 92 88  Resp: 16 16  Temp: 98.3 F (36.8 C)   TempSrc: Oral   SpO2: 94% 96%   Physical Exam Vitals reviewed.  Constitutional:      General: She is awake. She is not in acute distress.    Appearance: She is  ill-appearing.  HENT:     Head: Normocephalic.     Nose: No rhinorrhea.     Mouth/Throat:     Mouth: Mucous membranes are moist.  Eyes:     General: No scleral icterus.    Pupils: Pupils are equal, round, and reactive to light.  Neck:     Vascular: No JVD.  Cardiovascular:     Rate and Rhythm: Normal rate and regular rhythm.     Heart sounds: S1 normal and S2 normal.  Pulmonary:     Effort: Pulmonary effort is normal.     Breath sounds: Normal breath sounds. No wheezing, rhonchi or rales.  Abdominal:     General: Bowel sounds are normal. There is no distension.     Palpations: Abdomen is soft.     Tenderness: There is no abdominal tenderness. There is no right CVA tenderness or left CVA tenderness.  Musculoskeletal:     Cervical back: Neck supple.     Right lower leg: No edema.     Left lower leg: No edema.  Skin:    General: Skin is warm and dry.  Neurological:     General: No focal deficit present.     Mental Status: She is alert and oriented to person, place, and time.  Psychiatric:        Mood and Affect: Mood normal.        Behavior: Behavior normal. Behavior is cooperative.     Data Reviewed:  Results are pending, will review when available.  08/09/2021 echocardiogram report. IMPRESSIONS:   1. Left ventricular  ejection fraction, by estimation, is 60 to 65%. The  left ventricle has normal function. The left ventricle has no regional  wall motion abnormalities. There is moderate asymmetric left ventricular  hypertrophy of the basal-septal  segment. Left ventricular diastolic parameters are consistent with Grade I  diastolic dysfunction (impaired relaxation).   2. Right ventricular systolic function is mildly reduced. The right  ventricular size is normal. There is mildly elevated pulmonary artery  systolic pressure. The estimated right ventricular systolic pressure is  35.3 mmHg.   3. The mitral valve is normal in structure. No evidence of mitral valve  regurgitation.   4. The aortic valve was not well visualized. Aortic valve regurgitation  is trivial. Aortic valve sclerosis/calcification is present, without any  evidence of aortic stenosis.   5. The inferior vena cava is normal in size with greater than 50%  respiratory variability, suggesting right atrial pressure of 3 mmHg.   EKG: Vent. rate 89 BPM PR interval 199 ms QRS duration 85 ms QT/QTcB 370/451 ms P-R-T axes 49 -44 51 Sinus rhythm Ventricular premature complex Left axis deviation Anterior infarct, old  Assessment and Plan: Principal Problem:   Positive blood culture Likely a contaminant. However, having AMS. Blood cultures x 2 drawn. Received vancomycin  in the emergency department. Will check with ID on-call.  Active Problems:   Complicated UTI (urinary tract infection) In the setting of:   Suprapubic catheter (HCC) Begin cefepime  2 g IVPB Follow-up blood culture and sensitivity. Follow-up urine culture and sensitivity.    Unspecified dementia, severe, with other behavioral disturbance (HCC) Supportive care. Haloperidol  2 mg IVP every 6 hours as needed.    Pressure ulcer of left buttock, stage 2 (HCC) Continue local care and preventive measures.    Insomnia Continue trazodone  100 mg p.o. bedtime.    Anemia  of unknown etiology Monitor hematocrit hemoglobin.    Type 2 diabetes mellitus with hyperglycemia  long-term current use of insulin  (HCC) Carbohydrate modified diet. Continue Lantus  6 units daily. Continue metformin  1000 mg p.o. with breakfast. CBG monitoring with RI SS. Check hemoglobin A1c.     Hyperlipidemia associated with type 2 diabetes mellitus (HCC) Continue atorvastatin  40 mg p.o. daily.     Hypertension associated with diabetes (HCC) Continue metoprolol  succinate 25 mg p.o. daily. Continue amlodipine  5 mg p.o. daily.     Glaucoma Continue Cosopt  and Alphagan  drops. Follow-up with ophthalmology as an outpatient.     Hypothyroidism Continue levothyroxine  50 mcg p.o. daily.     Constipation Continue as needed MiraLAX  and Senokot.      Advance Care Planning:   Code Status: Do not attempt resuscitation (DNR) PRE-ARREST INTERVENTIONS DESIRED   Consults:   Family Communication:   Severity of Illness: The appropriate patient status for this patient is OBSERVATION. Observation status is judged to be reasonable and necessary in order to provide the required intensity of service to ensure the patient's safety. The patient's presenting symptoms, physical exam findings, and initial radiographic and laboratory data in the context of their medical condition is felt to place them at decreased risk for further clinical deterioration. Furthermore, it is anticipated that the patient will be medically stable for discharge from the hospital within 2 midnights of admission.   Author: Alm Dorn Castor, MD 07/08/2024 2:51 PM  For on call review [email or web address].   This document was prepared using Dragon voice recognition software and may contain some unintended transcription errors.

## 2024-07-08 NOTE — ED Triage Notes (Signed)
 Patient presents due to staph infection of her sacral wound. Family reports wound appears to heal, but re opens when she is moved. MD wanted her sent here.     EMS vitals: 126/64 BP 94 HR 97% SPO2 on room air 18 RR 132 CBG 97.3 temp

## 2024-07-09 ENCOUNTER — Other Ambulatory Visit: Payer: Self-pay

## 2024-07-09 ENCOUNTER — Other Ambulatory Visit: Payer: Self-pay | Admitting: Internal Medicine

## 2024-07-09 DIAGNOSIS — E039 Hypothyroidism, unspecified: Secondary | ICD-10-CM

## 2024-07-09 DIAGNOSIS — R7881 Bacteremia: Secondary | ICD-10-CM | POA: Diagnosis not present

## 2024-07-09 LAB — COMPREHENSIVE METABOLIC PANEL WITH GFR
ALT: 6 U/L (ref 0–44)
AST: 16 U/L (ref 15–41)
Albumin: 3.7 g/dL (ref 3.5–5.0)
Alkaline Phosphatase: 44 U/L (ref 38–126)
Anion gap: 8 (ref 5–15)
BUN: 11 mg/dL (ref 8–23)
CO2: 31 mmol/L (ref 22–32)
Calcium: 9 mg/dL (ref 8.9–10.3)
Chloride: 103 mmol/L (ref 98–111)
Creatinine, Ser: 0.51 mg/dL (ref 0.44–1.00)
GFR, Estimated: >60 mL/min (ref >60)
Glucose, Bld: 191 mg/dL — ABNORMAL HIGH (ref 70–99)
Potassium: 3.9 mmol/L (ref 3.5–5.1)
Sodium: 141 mmol/L (ref 135–145)
Total Bilirubin: 0.6 mg/dL (ref 0.0–1.2)
Total Protein: 6 g/dL — ABNORMAL LOW (ref 6.5–8.1)

## 2024-07-09 LAB — GLUCOSE, CAPILLARY
Glucose-Capillary: 161 mg/dL — ABNORMAL HIGH (ref 70–99)
Glucose-Capillary: 222 mg/dL — ABNORMAL HIGH (ref 70–99)
Glucose-Capillary: 161 mg/dL — ABNORMAL HIGH (ref 70–99)
Glucose-Capillary: 157 mg/dL — ABNORMAL HIGH (ref 70–99)

## 2024-07-09 LAB — CBC
HCT: 33 % — ABNORMAL LOW (ref 36.0–46.0)
Hemoglobin: 10.8 g/dL — ABNORMAL LOW (ref 12.0–15.0)
MCH: 31.7 pg (ref 26.0–34.0)
MCHC: 32.7 g/dL (ref 30.0–36.0)
MCV: 96.8 fL (ref 80.0–100.0)
Platelets: 210 K/uL (ref 150–400)
RBC: 3.41 MIL/uL — ABNORMAL LOW (ref 3.87–5.11)
RDW: 13.6 % (ref 11.5–15.5)
WBC: 3.4 K/uL — ABNORMAL LOW (ref 4.0–10.5)
nRBC: 0 % (ref 0.0–0.2)

## 2024-07-09 MED ORDER — OLANZAPINE 10 MG IM SOLR
2.5000 mg | Freq: Once | INTRAMUSCULAR | Status: AC | PRN
  Administered 2024-07-09: 2.5 mg via INTRAMUSCULAR
  Filled 2024-07-09: qty 10

## 2024-07-09 MED ORDER — HYDROXYZINE HCL 10 MG PO TABS
5.0000 mg | ORAL_TABLET | Freq: Once | ORAL | Status: AC | PRN
  Administered 2024-07-09: 5 mg via ORAL
  Filled 2024-07-09: qty 1

## 2024-07-09 MED ORDER — OLANZAPINE 10 MG IM SOLR
2.5000 mg | Freq: Once | INTRAMUSCULAR | Status: DC | PRN
  Filled 2024-07-09: qty 10

## 2024-07-09 NOTE — Plan of Care (Signed)

## 2024-07-09 NOTE — Progress Notes (Signed)
 PROGRESS NOTE    Hayley Fisher  FMW:[id number] DOB: 22-Sep-1930 DOA: 07/08/2024 PCP: Lennie Raguel MATSU, DO  Chronically ill 89/F with history of dementia, osteoarthritis, cataracts, chronic diarrhea, urinary retention with chronic Foley, history of DVT, IVC filter, sacral ulcer was seen in the ED 4 days ago for weakness, hyperglycemia, blood cultures drawn during that time ended up being positive for Staph epidermidis and hominis, asked to return to the hospital.  Family reported poor appetite, ongoing cognitive issues.  WBC 4.8, hemoglobin 10.6, chest x-ray with low lung volumes and bibasilar atelectasis, UA from catheter with greater than 50 WBCs, cloudy, large leukocyte esterase   Subjective: -Patient seen and examined, pleasant, laying in bed, reports no complaints this morning  Assessment and Plan:  Positive blood culture with staph epidermidis and hominis - Suspect this was a skin contaminant, drawn in the ER 11/13, unfortunately only 1 set obtained then - Afebrile, no leukocytosis - Monitor off antibiotics, follow-up repeat blood cultures drawn in the ER yesterday  Pressure ulcer left buttock stage II - This has healed, no signs or symptoms of infection at this time  Abnormal urinalysis - No signs or symptoms of infection, catheter last exchanged on 11/1 at the time of discharge from [facility] - Discontinue antibiotics and monitor  Dementia  Type 2 diabetes mellitus CBGs are stable continue metformin , SSI  Hypertension Continue amlodipine  and metoprolol   DVT prophylaxis: Lovenox  Code Status: DNR Family Communication: no Family at bedside Disposition Plan: Back home tomorrow if repeat blood cultures remain negative  Consultants:    Procedures:   Antimicrobials:    Objective: Vitals:   07/08/24 2010 07/08/24 2012 07/09/24 0443 07/09/24 0904  BP: (!) 178/80 (!) 155/72 139/68 (!) 166/74  Pulse: 85 84 77 84  Resp:   18 16  Temp:  98.4 F (36.9 C) 98.9 F (37.2 C) 97.9  F (36.6 C)  TempSrc:      SpO2: 100% 99% 100% 98%    Intake/Output Summary (Last 24 hours) at 07/09/2024 1022 Last data filed at 07/09/2024 0700 Gross per 24 hour  Intake --  Output 3800 ml  Net -3800 ml   There were no vitals filed for this visit.  Examination:  General exam: Appears calm and comfortable AO x 2, pleasant, no distress Respiratory system: Clear to auscultation Cardiovascular system: S1 & S2 heard, RRR.  Abd: nondistended, soft and nontender.Normal bowel sounds heard. Central nervous system: Awake alert, oriented to self and partly to place, some confusion and cognitive deficits noted Extremities: no edema Skin: Healed skin ulcer on the sacrum Psychiatry:  Mood & affect appropriate.     Data Reviewed:   CBC: Recent Labs  Lab 07/04/24 1151 07/04/24 1203 07/08/24 1357 07/09/24 0608  WBC  --  3.3* 4.8 3.4*  NEUTROABS  --  2.4 3.8  --   HGB 11.6* 11.0* 10.6* 10.8*  HCT 34.0* 34.8* 33.0* 33.0*  MCV  --  98.9 98.8 96.8  PLT  --  252 209 210   Basic Metabolic Panel: Recent Labs  Lab 07/04/24 1151 07/08/24 1357 07/09/24 0608  NA 138 139 141  K 3.8 4.0 3.9  CL 100 102 103  CO2  --  29 31  GLUCOSE 222* 107* 191*  BUN 19 17 11   CREATININE 0.80 0.62 0.51  CALCIUM   --  8.8* 9.0   GFR: CrCl cannot be calculated (Unknown ideal weight.). Liver Function Tests: Recent Labs  Lab 07/08/24 1357 07/09/24 0608  AST 18 16  ALT  6 6  ALKPHOS 47 44  BILITOT 0.5 0.6  PROT 6.3* 6.0*  ALBUMIN 3.8 3.7   No results for input(s): LIPASE, AMYLASE in the last 168 hours. No results for input(s): AMMONIA in the last 168 hours. Coagulation Profile: No results for input(s): INR, PROTIME in the last 168 hours. Cardiac Enzymes: No results for input(s): CKTOTAL, CKMB, CKMBINDEX, TROPONINI in the last 168 hours. BNP (last 3 results) No results for input(s): PROBNP in the last 8760 hours. HbA1C: No results for input(s): HGBA1C in the last 72  hours. CBG: Recent Labs  Lab 07/04/24 1526 07/04/24 1755 07/08/24 1718 07/08/24 2116 07/09/24 0714  GLUCAP 223* 236* 114* 162* 161*   Lipid Profile: No results for input(s): CHOL, HDL, LDLCALC, TRIG, CHOLHDL, LDLDIRECT in the last 72 hours. Thyroid  Function Tests: No results for input(s): TSH, T4TOTAL, FREET4, T3FREE, THYROIDAB in the last 72 hours. Anemia Panel: No results for input(s): VITAMINB12, FOLATE, FERRITIN, TIBC, IRON, RETICCTPCT in the last 72 hours. Urine analysis:    Component Value Date/Time   COLORURINE YELLOW 07/08/2024 1430   APPEARANCEUR CLOUDY (A) 07/08/2024 1430   LABSPEC 1.016 07/08/2024 1430   PHURINE 6.0 07/08/2024 1430   GLUCOSEU NEGATIVE 07/08/2024 1430   GLUCOSEU >=1000 (A) 12/16/2021 1447   HGBUR SMALL (A) 07/08/2024 1430   HGBUR trace-intact 07/20/2010 1433   BILIRUBINUR NEGATIVE 07/08/2024 1430   BILIRUBINUR negative 05/11/2021 1130   BILIRUBINUR NEG 08/09/2016 1500   KETONESUR NEGATIVE 07/08/2024 1430   PROTEINUR 100 (A) 07/08/2024 1430   UROBILINOGEN 0.2 12/16/2021 1447   NITRITE NEGATIVE 07/08/2024 1430   LEUKOCYTESUR LARGE (A) 07/08/2024 1430   Sepsis Labs: @LABRCNTIP (procalcitonin:4,lacticidven:4)  ) Recent Results (from the past 240 hours)  Culture, blood (routine x 2)     Status: Abnormal   Collection Time: 07/04/24 12:08 PM   Specimen: BLOOD  Result Value Ref Range Status   Specimen Description   Final    BLOOD RIGHT ANTECUBITAL Performed at [facility], [address], [place], [place] [address]    Special Requests   Final    BOTTLES DRAWN AEROBIC AND ANAEROBIC Blood Culture results may not be optimal due to an inadequate volume of blood received in culture bottles Performed at [facility], [address], [place], [place] [address]    Culture  Setup Time   Final    GRAM POSITIVE COCCI IN CLUSTERS ANAEROBIC BOTTLE ONLY CRITICAL RESULT CALLED TO, READ  BACK BY AND VERIFIED WITH: RN OLIVIA CORNELIA ON [id number] @1200  BY SM    Culture (A)  Final    STAPHYLOCOCCUS EPIDERMIDIS STAPHYLOCOCCUS HOMINIS THE SIGNIFICANCE OF ISOLATING THIS ORGANISM FROM A SINGLE SET OF BLOOD CULTURES WHEN MULTIPLE SETS ARE DRAWN IS UNCERTAIN. PLEASE NOTIFY THE MICROBIOLOGY DEPARTMENT WITHIN ONE WEEK IF SPECIATION AND SENSITIVITIES ARE REQUIRED. Performed at [facility], [address], [place], [place] [address]    Report Status 07/07/2024 FINAL  Final  Blood Culture ID Panel (Reflexed)     Status: Abnormal   Collection Time: 07/04/24 12:08 PM  Result Value Ref Range Status   Enterococcus faecalis NOT DETECTED NOT DETECTED Final   Enterococcus Faecium NOT DETECTED NOT DETECTED Final   Listeria monocytogenes NOT DETECTED NOT DETECTED Final   Staphylococcus species DETECTED (A) NOT DETECTED Final    Comment: CRITICAL RESULT CALLED TO, READ BACK BY AND VERIFIED WITH: RN OLIVIA CORNELIA ON [id number] @1200  BY SM    Staphylococcus aureus (BCID) NOT DETECTED NOT DETECTED Final  Staphylococcus epidermidis DETECTED (A) NOT DETECTED Final    Comment: Methicillin (oxacillin) resistant coagulase negative staphylococcus. Possible blood culture contaminant (unless isolated from more than one blood culture draw or clinical case suggests pathogenicity). No antibiotic treatment is indicated for blood  culture contaminants. CRITICAL RESULT CALLED TO, READ BACK BY AND VERIFIED WITH: RN ELYSSE GODAIRE ON [id number] @1200  BY SM    Staphylococcus lugdunensis NOT DETECTED NOT DETECTED Final   Streptococcus species NOT DETECTED NOT DETECTED Final   Streptococcus agalactiae NOT DETECTED NOT DETECTED Final   Streptococcus pneumoniae NOT DETECTED NOT DETECTED Final   Streptococcus pyogenes NOT DETECTED NOT DETECTED Final   A.calcoaceticus-baumannii NOT DETECTED NOT DETECTED Final   Bacteroides fragilis NOT DETECTED NOT DETECTED Final   Enterobacterales NOT DETECTED NOT DETECTED Final    Enterobacter cloacae complex NOT DETECTED NOT DETECTED Final   Escherichia coli NOT DETECTED NOT DETECTED Final   Klebsiella aerogenes NOT DETECTED NOT DETECTED Final   Klebsiella oxytoca NOT DETECTED NOT DETECTED Final   Klebsiella pneumoniae NOT DETECTED NOT DETECTED Final   Proteus species NOT DETECTED NOT DETECTED Final   Salmonella species NOT DETECTED NOT DETECTED Final   Serratia marcescens NOT DETECTED NOT DETECTED Final   Haemophilus influenzae NOT DETECTED NOT DETECTED Final   Neisseria meningitidis NOT DETECTED NOT DETECTED Final   Pseudomonas aeruginosa NOT DETECTED NOT DETECTED Final   Stenotrophomonas maltophilia NOT DETECTED NOT DETECTED Final   Candida albicans NOT DETECTED NOT DETECTED Final   Candida auris NOT DETECTED NOT DETECTED Final   Candida glabrata NOT DETECTED NOT DETECTED Final   Candida krusei NOT DETECTED NOT DETECTED Final   Candida parapsilosis NOT DETECTED NOT DETECTED Final   Candida tropicalis NOT DETECTED NOT DETECTED Final   Cryptococcus neoformans/gattii NOT DETECTED NOT DETECTED Final   Methicillin resistance mecA/C DETECTED (A) NOT DETECTED Final    Comment: CRITICAL RESULT CALLED TO, READ BACK BY AND VERIFIED WITH: RN OLIVIA CORNELIA ON [id number] @1200  BY SM Performed at [facility], [address], [place], [place] [address]   Culture, blood (Routine X 2) w Reflex to ID Panel     Status: None (Preliminary result)   Collection Time: 07/08/24  1:57 PM   Specimen: BLOOD LEFT ARM  Result Value Ref Range Status   Specimen Description   Final    BLOOD LEFT ARM Performed at [facility], [address] [place], [place] [address]    Special Requests   Final    BOTTLES DRAWN AEROBIC AND ANAEROBIC Blood Culture adequate volume Performed at [facility], [address] [place], [place] [address]    Culture   Final    NO GROWTH < 24 HOURS Performed at [facility], [address], [place], [place] [address]     Report Status PENDING  Incomplete  Culture, blood (Routine X 2) w Reflex to ID Panel     Status: None (Preliminary result)   Collection Time: 07/08/24  1:57 PM   Specimen: BLOOD RIGHT ARM  Result Value Ref Range Status   Specimen Description   Final    BLOOD RIGHT ARM Performed at [facility], [address], [place], [place] [address]    Special Requests   Final    BOTTLES DRAWN AEROBIC AND ANAEROBIC Blood Culture adequate volume Performed at [facility], [address] [place], [place] [address]    Culture   Final    NO GROWTH < 24  HOURS Performed at [facility], [address], [place], [place] [address]    Report Status PENDING  Incomplete  Urine Culture     Status: None (Preliminary result)   Collection Time: 07/08/24  2:30 PM   Specimen: Urine, Random  Result Value Ref Range Status   Specimen Description   Final    URINE, RANDOM Performed at [facility], [address], [place], [place] [address]    Special Requests URINE, SUPRAPUBIC  Final   Culture   Final    CULTURE REINCUBATED FOR BETTER GROWTH Performed at [facility], [address] [place], [place] [address]    Report Status PENDING  Incomplete     Radiology Studies: DG Chest Port 1 View Result Date: 07/08/2024 CLINICAL DATA:  Sacral wound.  Bacteremia. EXAM: PORTABLE CHEST 1 VIEW COMPARISON:  04/03/2024. FINDINGS: Low lung volumes with bronchovascular crowding, similar to the prior exam. Cardiomediastinal silhouette is unchanged. Left-greater-than-right bibasilar subsegmental atelectasis. No sizable pleural effusion or pneumothorax. No acute osseous abnormality. IMPRESSION: Low lung volumes with bibasilar atelectasis. Electronically Signed   By: Harrietta Sherry M.D.   On: 07/08/2024 14:56     Scheduled Meds:  amLODipine   5 mg Oral Daily   atorvastatin   40 mg Oral Daily   brimonidine   1 drop Left Eye BID   dorzolamide -timolol   1 drop Both Eyes  BID   enoxaparin  (LOVENOX ) injection  40 mg Subcutaneous Q24H   insulin  aspart  0-9 Units Subcutaneous TID WC   insulin  glargine-yfgn  6 Units Subcutaneous Daily   levothyroxine   50 mcg Oral Daily   metFORMIN   1,000 mg Oral Q breakfast   metoprolol  succinate  25 mg Oral Daily   mirabegron  ER  25 mg Oral Daily   polyethylene glycol powder  17 g Oral QODAY   tamsulosin   0.4 mg Oral Daily   Continuous Infusions:  sodium chloride  100 mL/hr at 07/09/24 0128   ceFEPime  (MAXIPIME ) IV 2 g (07/09/24 0915)     LOS: 0 days    Time spent:    Sigurd Pac, MD [facility]   07/09/2024, 10:22 AM

## 2024-07-09 NOTE — Care Management Obs Status (Signed)
 MEDICARE OBSERVATION STATUS NOTIFICATION   Patient Details  Name: Hayley Fisher MRN: [id number] Date of Birth: [birth year]   Medicare Observation Status Notification Given:  Yes (Delivered telephonically with pt daughter Hayley Fisher at [phone])    Jon ONEIDA Anon, RN 07/09/2024, 2:03 PM

## 2024-07-09 NOTE — TOC Initial Note (Signed)
 Transition of Care [facility]) - Initial/Assessment Note    Patient Details  Name: Hayley Fisher MRN: [id number] Date of Birth: [birth year]  [facility]) CM/SW Contact:    Jon ONEIDA Anon, RN Phone Number: 07/09/2024, 1:43 PM  Clinical Narrative:                 Pt is from home with daughter. RNCM spoke with pt daughter [name] at [phone] to discuss discharge plans. Pt daughter states she lives at home with daughter [name]. States she uses a W/C for mobility at baseline, as pt does not walk. She has a hospital bed and hoyer lift in the home. Pt also has 8 hours a day of CAP service and respite care. HHAides provided through [facility]. Pt will require transportation by ambulance at discharge. PT/OT consulted, will await any new recommendations. IP CM will continue to follow.     Expected Discharge Plan: Home w Home Health Services Barriers to Discharge: Continued Medical Work up   Patient Goals and CMS Choice Patient states their goals for this hospitalization and ongoing recovery are:: To return home with family CMS Medicare.gov Compare Post Acute Care list provided to:: Patient Represenative (must comment) (Pt daughter Lonell Smack) Choice offered to / list presented to : Adult Children [place] ownership interest in [facility].provided to:: Adult Children    Expected Discharge Plan and Services In-house Referral: NA Discharge Planning Services: CM Consult Post Acute Care Choice: Durable Medical Equipment, Resumption of Svcs/PTA Provider (CAP services, Respite Care) Living arrangements for the past 2 months: Single Family Home                 DME Arranged: N/A DME Agency: NA       HH Arranged: NA HH Agency: NA        Prior Living Arrangements/Services Living arrangements for the past 2 months: Single Family Home Lives with:: Adult Children Patient language and need for interpreter reviewed:: Yes Do you feel safe going back to  the place where you live?: Yes      Need for Family Participation in Patient Care: Yes (Comment) Care giver support system in place?: Yes (comment) Current home services: DME, Homehealth aide Criminal Activity/Legal Involvement Pertinent to Current Situation/Hospitalization: No - Comment as needed  Activities of Daily Living   ADL Screening (condition at time of admission) Independently performs ADLs?: No Does the patient have a NEW difficulty with bathing/dressing/toileting/self-feeding that is expected to last >3 days?: Yes (Initiates electronic notice to provider for possible OT consult) Does the patient have a NEW difficulty with getting in/out of bed, walking, or climbing stairs that is expected to last >3 days?: No Does the patient have a NEW difficulty with communication that is expected to last >3 days?: No Is the patient deaf or have difficulty hearing?: Yes Does the patient have difficulty seeing, even when wearing glasses/contacts?: Yes Does the patient have difficulty concentrating, remembering, or making decisions?: Yes  Permission Sought/Granted Permission sought to share information with : Family Supports    Share Information with NAME: [name] (Daughter)  [phone]           Emotional Assessment Appearance:: Appears stated age Attitude/Demeanor/Rapport: Unable to Assess Affect (typically observed): Unable to Assess Orientation: : Oriented to Self Alcohol  / Substance Use: Not Applicable Psych Involvement: No (comment)  Admission diagnosis:  Positive blood cultures [R78.81] Positive blood culture [R78.81] Patient Active Problem List   Diagnosis Date Noted   Positive  blood culture 07/08/2024   Complicated UTI (urinary tract infection) 07/08/2024   UTI (urinary tract infection) 06/22/2024   Caregiver burden 04/26/2024   Palliative care patient 04/26/2024   Pressure ulcer of left buttock, stage 2 (HCC) 04/25/2024   Pressure ulcer of right buttock,  stage 3 (HCC) 04/05/2024   SIRS (systemic inflammatory response syndrome) (HCC) 04/04/2024   Type 2 diabetes mellitus with hypoglycemia (HCC) 04/04/2024   Failure to thrive in adult 04/04/2024   Behavioral and psychological symptoms of dementia (HCC) 04/03/2024   Acute cystitis without hematuria 03/21/2024   Somnolence 03/19/2024   Anemia of unknown etiology 03/17/2024   Chronic health problem 03/15/2024   Sacral decubitus ulcer 03/15/2024   Hypoglycemia    Acute hypoxemic respiratory failure (HCC) 03/16/2023   Bilateral sensorineural hearing loss 09/07/2022   Weight loss, unintentional 05/06/2022   Diabetic neuropathy (HCC) 05/05/2022   Suprapubic catheter (HCC)    Polypharmacy 03/02/2022   Type 2 diabetes mellitus with hyperglycemia, with long-term current use of insulin  (HCC) 02/11/2022   History of DVT (deep vein thrombosis) 11/04/2021   Mood disorder 09/09/2021   Constipation 08/31/2021   Insomnia 08/31/2021   Unspecified dementia, severe, with other behavioral disturbance (HCC) 02/21/2021   Hypothyroidism 12/09/2020   Unstable gait 04/03/2018   Frequent falls 04/03/2018   Skin ulcer of sacrum, limited to breakdown of skin (HCC) 03/16/2017   Spinal stenosis of lumbar region 01/13/2016   Chronic venous insufficiency 11/09/2015   DDD (degenerative disc disease), lumbosacral 11/24/2014   Frequent UTI 10/29/2012   Onychomycosis 04/03/2009   Hyperlipidemia associated with type 2 diabetes mellitus (HCC) 10/19/2006   Glaucoma 10/19/2006   Hypertension associated with diabetes (HCC) 10/19/2006   Osteoarthritis, multiple sites 10/19/2006   Osteopenia 10/19/2006   PCP:  Lennie Raguel MATSU, DO Pharmacy:   [facility] [place], [place] - [address] [place] [address] Phone: [phone] Fax: [phone]     Social Drivers of Health (SDOH) Social History: SDOH Screenings   Food Insecurity: Patient Declined (07/08/2024)  Housing: Patient  Declined (07/08/2024)  Transportation Needs: Patient Declined (07/08/2024)  Utilities: Patient Declined (07/08/2024)  Alcohol  Screen: Low Risk  (10/26/2023)  Depression (PHQ2-9): Low Risk  (06/06/2024)  Financial Resource Strain: Low Risk  (10/26/2023)  Physical Activity: Inactive (10/26/2023)  Social Connections: Patient Declined (07/08/2024)  Stress: No Stress Concern Present (10/26/2023)  Tobacco Use: Medium Risk (07/08/2024)  Health Literacy: Inadequate Health Literacy (10/26/2023)   SDOH Interventions: Housing Interventions: Patient Declined Transportation Interventions: [facility]) Utilities Interventions: Patient Declined Social Connections Interventions: Patient Declined   Readmission Risk Interventions    03/15/2024    2:01 PM  Readmission Risk Prevention Plan  Transportation Screening Complete  PCP or Specialist Appt within 5-7 Days Complete  Home Care Screening Complete  Medication Review (RN CM) Complete

## 2024-07-10 DIAGNOSIS — R7881 Bacteremia: Secondary | ICD-10-CM | POA: Diagnosis not present

## 2024-07-10 LAB — GLUCOSE, CAPILLARY
Glucose-Capillary: 126 mg/dL — ABNORMAL HIGH (ref 70–99)
Glucose-Capillary: 234 mg/dL — ABNORMAL HIGH (ref 70–99)
Glucose-Capillary: 186 mg/dL — ABNORMAL HIGH (ref 70–99)
Glucose-Capillary: 215 mg/dL — ABNORMAL HIGH (ref 70–99)

## 2024-07-10 LAB — CBC
HCT: 32.2 % — ABNORMAL LOW (ref 36.0–46.0)
Hemoglobin: 10.2 g/dL — ABNORMAL LOW (ref 12.0–15.0)
MCH: 30.8 pg (ref 26.0–34.0)
MCHC: 31.7 g/dL (ref 30.0–36.0)
MCV: 97.3 fL (ref 80.0–100.0)
Platelets: 211 K/uL (ref 150–400)
RBC: 3.31 MIL/uL — ABNORMAL LOW (ref 3.87–5.11)
RDW: 13.6 % (ref 11.5–15.5)
WBC: 3.1 K/uL — ABNORMAL LOW (ref 4.0–10.5)
nRBC: 0 % (ref 0.0–0.2)

## 2024-07-10 LAB — URINE CULTURE: Culture: >=100000 — AB

## 2024-07-10 LAB — BASIC METABOLIC PANEL WITH GFR
Anion gap: 11 (ref 5–15)
BUN: 12 mg/dL (ref 8–23)
CO2: 28 mmol/L (ref 22–32)
Calcium: 8.4 mg/dL — ABNORMAL LOW (ref 8.9–10.3)
Chloride: 105 mmol/L (ref 98–111)
Creatinine, Ser: 0.63 mg/dL (ref 0.44–1.00)
GFR, Estimated: >60 mL/min (ref >60)
Glucose, Bld: 139 mg/dL — ABNORMAL HIGH (ref 70–99)
Potassium: 3.7 mmol/L (ref 3.5–5.1)
Sodium: 143 mmol/L (ref 135–145)

## 2024-07-10 NOTE — Evaluation (Signed)
 Physical Therapy Brief Evaluation and Discharge Note Patient Details Name: Hayley Fisher MRN: [id number] DOB: 1931-04-22 Today's Date: 07/10/2024   History of Present Illness  Patient is 89 y.o. female admitted on 07/08/2024 due to blood culture from prior presentation to ED 11/13 positive for Staph epidermidis and hominis. Pt exhibiting weakness, poor po intake and cognitive changes with hx of underlying dementia. Pt PMH includes but is not limited to: DM II, hypothyroidism, HTN, anemia, dementia, L buttock stage II pressure ulcer, urinary incontinence with chronic indwelling urinary catheter, arthritis, glaucoma, HLD, CKD, syncope and constipation.  Clinical Impression    Patient evaluated by Physical Therapy with no further acute PT needs identified. Pt is to transition to hospice care. See below for any follow-up Physical Therapy or DME needs. PT is signing off. Thank you for this referral.       PT Assessment    Assistance Needed at Discharge       Equipment Recommendations None recommended by PT  Recommendations for Other Services       Precautions/Restrictions Precautions Precautions: Fall;Other (comment) Precaution/Restrictions Comments: sacral decubitus ulcer, B UE and LE contractures, no signs of skin breakdown B UE nor LE Restrictions Weight Bearing Restrictions Per Provider Order: No Other Position/Activity Restrictions: pt non ambulatory, w/c level and bed bound recently        Mobility  Bed Mobility          Transfers                   General transfer comment: NT --pt requires total A lift/hoyer at baseline    Ambulation/Gait           General Gait Details: NT  Home Activity Instructions    Stairs            Modified Rankin (Stroke Patients Only)        Balance   Sitting-balance support: Feet supported Sitting balance-Leahy Scale: Zero Sitting balance - Comments: mod to max A x 2 for sitting balance with strong L lateral  lean                  Pertinent Vitals/Pain   Pain Assessment Pain Assessment: No/denies pain (no pain report or apparent response) Breathing: normal Negative Vocalization: none Facial Expression: smiling or inexpressive Body Language: relaxed Consolability: no need to console PAINAD Score: 0     Home Living   Living Arrangements: Children       Home Equipment: Wheelchair - manual;Hospital bed;Other (comment)   Additional Comments: info obtained from pevious chart from 3 months [id number]:[id number] lift, B resting hand splints, but doesnt wear all that often    Prior Function        UE/LE Assessment               Communication   Communication Communication: No apparent difficulties (increased time and soft spoken) Factors Affecting Communication: Alexandra GLENWOOD Due valve     Cognition         General Comments      Exercises     Assessment/Plan    PT Problem List         PT Visit Diagnosis      No Skilled PT     Co-evaluation PT/OT/SLP Co-Evaluation/Treatment: Yes Reason for Co-Treatment: Complexity of the patient's impairments (multi-system involvement);For patient/therapist safety PT goals addressed during session: Mobility/safety with mobility;Balance;Proper use of DME;Strengthening/ROM OT goals addressed during session: ADL's and self-care  AMPAC 6 Clicks Help needed turning from your back to your side while in a flat bed without using bedrails?: Total Help needed moving from lying on your back to sitting on the side of a flat bed without using bedrails?: Total Help needed moving to and from a bed to a chair (including a wheelchair)?: Total Help needed standing up from a chair using your arms (e.g., wheelchair or bedside chair)?: Total Help needed to walk in hospital room?: Total Help needed climbing 3-5 steps with a railing? : Total 6 Click Score: 6      End of Session   Activity Tolerance: Patient tolerated treatment well Patient  left: in bed;with call bell/phone within reach;with bed alarm set Nurse Communication: Mobility status;Need for lift equipment       Time: 1110-1131 PT Time Calculation (min) (ACUTE ONLY): 21 min  Charges:   PT Evaluation $PT Eval Low Complexity: 1 Low      [place], PT Acute Rehab   [place] VEAR Drone  07/10/2024, 3:26 PM

## 2024-07-10 NOTE — Progress Notes (Signed)
 [facility] Liaison Note  Received request from [facility] for hospice services at home after discharge. Spoke with patient's daughter  to initiate education related to hospice philosophy, services and team approach to care. Daughter verbalized understanding of information given. Per discussion, the plan is for discharge home by EMS tomorrow.   DME needs discussed. Patient has the following equipment in the home: hospital bed, wheelchair, hoyer lift. Family requests the following equipment for delivery: none at this time.   Please send signed and completed DNR home with patient/family. Please provide prescriptions at discharge as needed to ensure ongoing symptom management.  [facility] information and contact numbers given to patient's daughter. Please call with any concerns.  Thank you for the opportunity to participate in this patient's care.   Eleanor Nail, LPN [facility] Liaison [phone]

## 2024-07-10 NOTE — Consult Note (Signed)
 Palliative Medicine Inpatient Consult Note  Consulting Provider: Fairy Frames, MD   Reason for consult:   Palliative Care Consult Services Palliative Medicine Consult  Reason for Consult? Goals of care   07/10/2024  HPI:  Per intake H&P --> Chronically ill 89/F with history of dementia, osteoarthritis, cataracts, chronic diarrhea, urinary retention with chronic Foley, history of DVT, IVC filter, sacral ulcer was seen in the ED 4 days ago for weakness, hyperglycemia, blood cultures drawn during that time ended up being positive for Staph epidermidis and hominis, asked to return to the hospital. Family reported poor appetite, ongoing cognitive issues. WBC 4.8, hemoglobin 10.6, chest x-ray with low lung volumes and bibasilar atelectasis, UA from catheter with greater than 50 WBCs, cloudy, large leukocyte esterase. The Palliative care team has been asked to support additional goals of care conversations.   Clinical Assessment/Goals of Care:  *Please note that this is a verbal dictation therefore any spelling or grammatical errors are due to the Dragon Medical One system interpretation.  I have reviewed medical records including EPIC notes, labs and imaging, received report from bedside RN, assessed the patient who is lying in bed in no acute distress.    I met with patient's daughters, Olam American, and son-in-law Sam to further discuss diagnosis prognosis, GOC, EOL wishes, disposition and options.   I introduced Palliative Medicine as specialized medical care for people living with serious illness. It focuses on providing relief from the symptoms and stress of a serious illness. The goal is to improve quality of life for both the patient and the family.  Medical History Review and Understanding:  A review of Eldene's past medical history significant for diabetes mellitus, hypertension, dementia, osteoarthritis, chronic Foley catheter with recurrent urinary infections, DVT with IVC  filter, stage II sacral ulcer, cataracts, and chronic diarrhea was completed.  Social History:  Orlean lives in [place], [place] .  She is 1 of 6 children and per her daughters the last 1 living.  She herself had 4 children 2 of whom are deceased, 8 grandchildren, and 3 great-grandchildren with 1 on the way.  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  Milynn is a woman of strong faith and is born again she used to enjoy going to church though this has become more difficult over the past few years.  Functional and Nutritional State:  Prior to hospitalization Kelina was living in a home with her daughter Olam and son-in-law Sam.  She is predominantly bedbound for the past 3 years though previously she was able to get to a wheelchair with a Deitra lift however this has been more limited as of recently due to her sacral decubitus ulcer.  She does have been Program which comes every day from 8 AM to 4 PM.  She has been able to eat and drink fairly decently.  Advance Directives:  A detailed discussion was had today regarding advanced directives.  Manya has 2 surviving children who are her surrogate decision makers.  Code Status:  Concepts specific to code status, artifical feeding and hydration, continued IV antibiotics and rehospitalization was had.  The difference between a aggressive medical intervention path  and a palliative comfort care path for this patient at this time was had.   Detrice is an established DO NOT RESUSCITATE DO NOT INTUBATE CODE STATUS.  Discussion:  Prior to hospitalization Charlie per her family had had 2 weeks whereby she was more profoundly weak and from a musculoskeletal perspective.  She is also during this time  been calling out for her dead relatives at a more frequent cadence.  She had recently been hospitalized in the setting of hypoglycemia and hypothermia receiving antibiotics and going back home.  Despite that treatment this happened again leading to  hospitalization.  Patient's daughter, Olam shares that Shontelle has been very vocal about her readiness to meet the Jacquetta and leave the earth when it is her time.  We discussed the options moving forward 1 of which is hospice which it does appear would align most with patient's wishes. I described hospice as a service for patients who have a life expectancy of 6 months or less. The goal of hospice is the preservation of dignity and quality at the end phases of life. Under hospice care, the focus changes from curative to symptom relief.   Patient's daughter share that Scherry was on hospice previously and ended up graduating from services.  They are familiar with hospice and very appreciative of the philosophy associated with this.  They are in agreement with hospice as long as the patient can continue to receive her CAP benefits.  I was able to call with patient's daughter her CAP social worker, Joy who did confirm she can have hospice and her CAP benefits as long as the services do not overlap.  She shares they frequently work with [facility] which patient's family was also familiar with and in agreement with.  As of presently the plan will be for another day of observation of [place] for medical optimization and then to transition home with hospice services.  Discussed the importance of continued conversation with family and their  medical providers regarding overall plan of care and treatment options, ensuring decisions are within the context of the patients values and GOCs.  Decision Maker: Courtney American (Daughter): [phone] (Mobile)   SUMMARY OF RECOMMENDATIONS   DNAR/DNI  Continue current care   Patients family are open to moving forward on discharge with hospice services through [facility] - confirmed that patient can keep CAP benefits as long as there is not overlap; CAP MSW Joy [phone]  Ongoing PMT support  Code Status/Advance Care Planning: DNAR/DNI  Palliative  Prophylaxis:  Aspiration, Bowel Regimen, Delirium Protocol, Frequent Pain Assessment, Oral Care, Palliative Wound Care, and Turn Reposition  Additional Recommendations (Limitations, Scope, Preferences): Continue current care  Psycho-social/Spiritual:  Desire for further Chaplaincy support: Yes Additional Recommendations: Education on dementia progression, EOL   Prognosis: Limited overall.   Discharge Planning: Discharge home once medically optimized.   Vitals:   07/09/24 0904 07/09/24 2017  BP: (!) 166/74 114/65  Pulse: 84 76  Resp: 16 17  Temp: 97.9 F (36.6 C) 99.9 F (37.7 C)  SpO2: 98% 96%    Intake/Output Summary (Last 24 hours) at 07/10/2024 0946 Last data filed at 07/09/2024 1836 Gross per 24 hour  Intake 1904.9 ml  Output --  Net 1904.9 ml   Last Weight  Most recent update: 07/09/2024  2:15 PM    Weight  57 kg (125 lb 10.6 oz)            LABS: CBC:    Component Value Date/Time   WBC 3.1 (L) 07/10/2024 0548   HGB 10.2 (L) 07/10/2024 0548   HGB 11.0 (L) 02/08/2024 1537   HCT 32.2 (L) 07/10/2024 0548   HCT 36.1 02/08/2024 1537   PLT 211 07/10/2024 0548   PLT 207 02/08/2024 1537   MCV 97.3 07/10/2024 0548   MCV 99 (H) 02/08/2024 1537   NEUTROABS 3.8 07/08/2024 1357  NEUTROABS 3.1 02/08/2024 1537   LYMPHSABS 0.7 07/08/2024 1357   LYMPHSABS 0.7 02/08/2024 1537   MONOABS 0.3 07/08/2024 1357   EOSABS 0.1 07/08/2024 1357   EOSABS 0.1 02/08/2024 1537   BASOSABS 0.0 07/08/2024 1357   BASOSABS 0.0 02/08/2024 1537   Comprehensive Metabolic Panel:    Component Value Date/Time   NA 143 07/10/2024 0548   NA 140 02/08/2024 1537   K 3.7 07/10/2024 0548   CL 105 07/10/2024 0548   CO2 28 07/10/2024 0548   BUN 12 07/10/2024 0548   BUN 20 02/08/2024 1537   CREATININE 0.63 07/10/2024 0548   CREATININE 0.74 03/25/2016 0921   GLUCOSE 139 (H) 07/10/2024 0548   CALCIUM  8.4 (L) 07/10/2024 0548   AST 16 07/09/2024 0608   ALT 6 07/09/2024 0608   ALKPHOS 44  07/09/2024 0608   BILITOT 0.6 07/09/2024 0608   BILITOT 0.4 02/08/2024 1537   PROT 6.0 (L) 07/09/2024 0608   PROT 5.9 (L) 02/08/2024 1537   ALBUMIN 3.7 07/09/2024 0608   ALBUMIN 3.9 02/08/2024 1537   Gen:  Frail elderly AA F HEENT: moist mucous membranes CV: Regular rate and rhythm  PULM:  On RA, breathing is even and nonlabored ABD: soft/nontender  EXT: No edema  Neuro: Alert and oriented x1  PPS: 20%   This conversation/these recommendations were discussed with patient primary care team, Dr. Fairy ______________________________________________________ Rosaline [facility] Palliative Medicine Team Team Cell Phone: [phone] Please utilize secure chat with additional questions, if there is no response within 30 minutes please call the above phone number  Total Time: 75 Billing based on MDM: High  Palliative Medicine Team providers are available by phone from 7am to 7pm daily and can be reached through the team cell phone.  Should this patient require assistance outside of these hours, please call the patient's attending physician.

## 2024-07-10 NOTE — Evaluation (Addendum)
 Occupational Therapy Evaluation Patient Details Name: Hayley Fisher MRN: [id number] DOB: April 14, 1931 Today's Date: 07/10/2024   History of Present Illness   89 y/o with history of dementia, osteoarthritis, cataracts, chronic diarrhea, urinary retention with chronic Foley, history of DVT, IVC filter, sacral ulcer was seen in the ED 4 days ago for weakness, hyperglycemia, blood cultures drawn during that time ended up being positive for Staph epidermidis and hominis, asked to return to the hospital. Family reported poor appetite, ongoing cognitive issues.     Clinical Impressions Pt ist at baseline total A level with feeding, selfcare and transfers (hoyer). PTA pt live with her daughter and son in law with 24/7 care. Pt with hx of B UE contractures with B hand splints at home. Pt required total A +2 for sitting EOB and for balance/support with  leaning observed. Pt mostly bed bound at home. Pt/family considering d/c home with hospice services. No further acute OT services are indicated at this time.      If plan is discharge home, recommend the following:   Two people to help with bathing/dressing/bathroom;Two people to help with walking and/or transfers;Assistance with feeding;Assist for transportation;Direct supervision/assist for financial management;Assistance with cooking/housework;Direct supervision/assist for medications management     Functional Status Assessment   Patient has not had a recent decline in their functional status     Equipment Recommendations   None recommended by OT     Recommendations for Other Services         Precautions/Restrictions   Precautions Precautions: Fall;Other (comment) Precaution/Restrictions Comments: sacral decubitus ulcer, B UE and LE contractures, no signs of skin breakdown Restrictions Other Position/Activity Restrictions: pt non ambulatory, w/c level and bed bound recently     Mobility Bed Mobility                General bed mobility comments: total +2 with use of pad underneath pt to rotate, total A +2 for balance, pt with lean    Transfers                   General transfer comment: pt requires total A lift/hoyer at baseline      Balance                                           ADL either performed or assessed with clinical judgement   ADL                                         General ADL Comments: pt requires total A at baseline including self feeding     Vision Baseline Vision/History: 1 Wears glasses Ability to See in Adequate Light: 0 Adequate Patient Visual Report: No change from baseline       Perception         Praxis         Pertinent Vitals/Pain Pain Assessment Pain Assessment: No/denies pain     Extremity/Trunk Assessment Upper Extremity Assessment Upper Extremity Assessment: Generalized weakness;Right hand dominant;RUE deficits/detail;LUE deficits/detail RUE Deficits / Details: wrist/hand contractures. ha shand splinst at home LUE Deficits / Details: hand/wrist and elobe contractures. has hand splints at home   Lower Extremity Assessment Lower Extremity Assessment: Defer to PT evaluation       Communication Communication Communication: Impaired  Factors Affecting Communication: [place] - Muir valve   Cognition Arousal: Alert Behavior During Therapy: WFL for tasks assessed/performed Cognition: History of cognitive impairments                                       Cueing  General Comments   Cueing Techniques: Verbal cues      Exercises     Shoulder Instructions      Home Living Family/patient expects to be discharged to:: Private residence Living Arrangements: Children Available Help at Discharge: Family;Available 24 hours/day;Personal care attendant Type of Home: House Home Access: Stairs to enter [employer] of Steps: 6 Entrance Stairs-Rails: Right Home Layout:  One level     Bathroom Shower/Tub: Sponge bathes at baseline   [employer]: Standard     Home Equipment: Wheelchair - manual;Hospital bed;Other (comment)   Additional Comments: info obtained from pevious chart from 3 months jhn:ynbzm lift, B resting hand splints, but doesnt wear all that often      Prior Functioning/Environment Prior Level of Function : Needs assist             Mobility Comments: mostly bed bound, family total lifts pt, has hoyer ADLs Comments: Pt is total A with feeding, grooming, bathing, dressing    OT Problem List: Decreased strength;Decreased range of motion;Decreased coordination;Impaired tone;Decreased cognition;Decreased activity tolerance;Impaired balance (sitting and/or standing)   OT Treatment/Interventions:        OT Goals(Current goals can be found in the care plan section)   Acute Rehab OT Goals Patient Stated Goal: none stated   OT Frequency:       Co-evaluation PT/OT/SLP Co-Evaluation/Treatment: Yes Reason for Co-Treatment: Complexity of the patient's impairments (multi-system involvement);For patient/therapist safety   OT goals addressed during session: ADL's and self-care      AM-PAC OT 6 Clicks Daily Activity     Outcome Measure Help from another person eating meals?: Total Help from another person taking care of personal grooming?: Total Help from another person toileting, which includes using toliet, bedpan, or urinal?: Total Help from another person bathing (including washing, rinsing, drying)?: Total Help from another person to put on and taking off regular upper body clothing?: Total Help from another person to put on and taking off regular lower body clothing?: Total 6 Click Score: 6   End of Session    Activity Tolerance: Patient limited by fatigue Patient left: in bed;with call bell/phone within reach;with bed alarm set  OT Visit Diagnosis: Other abnormalities of gait and mobility (R26.89);Muscle weakness  (generalized) (M62.81)                Time: 8886-8868 OT Time Calculation (min): 18 min Charges:  OT General Charges $OT Visit: 1 Visit OT Evaluation $OT Eval Moderate Complexity: 1 Mod    Jacques Karna Loose 07/10/2024, 12:45 PM

## 2024-07-10 NOTE — Progress Notes (Addendum)
 PROGRESS NOTE    Hayley Fisher  FMW:[id number] DOB: [birth year] DOA: 07/08/2024 PCP: Lennie Raguel MATSU, DO  Chronically ill 89/F with history of dementia, osteoarthritis, cataracts, chronic diarrhea, urinary retention with chronic Foley, history of DVT, IVC filter, sacral ulcer was seen in the ED 4 days ago for weakness, hyperglycemia, blood cultures drawn during that time ended up being positive for Staph epidermidis and hominis, asked to return to the hospital.  Family reported poor appetite, ongoing cognitive issues.  WBC 4.8, hemoglobin 10.6, chest x-ray with low lung volumes and bibasilar atelectasis, UA from catheter with greater than 50 WBCs, cloudy, large leukocyte esterase   Subjective: -Patient seen and examined, pleasant, laying in bed, reports no complaints this morning  Assessment and Plan:  Positive blood culture with staph epidermidis and hominis - Suspect this was a skin contaminant, drawn in the ER 11/13, unfortunately only 1 set obtained then - Afebrile, no leukocytosis - Monitor off antibiotics, repeat blood cultures are negative  Dementia Delirium, fluctuating mental status - Frail cachectic malnourished, contracted - Discussed poor prognosis with daughter, palliative consulted for goals of care, discussed consideration of hospice  Pressure ulcer left buttock stage II - This has healed, no signs or symptoms of infection at this time  Abnormal urinalysis - No signs or symptoms of infection,  - Urine culture with Staph aureus, likely colonization of previous catheter, -suprapubic catheter exchanged in the ER 11/17  Dementia  Type 2 diabetes mellitus CBGs are stable continue metformin , SSI  Hypertension Continue amlodipine  and metoprolol   DVT prophylaxis: Lovenox  Code Status: DNR Family Communication: no Family at bedside, called and updated daughter Disposition Plan: Home soon,  Consultants:    Procedures:   Antimicrobials:    Objective: Vitals:    07/09/24 0443 07/09/24 0904 07/09/24 1415 07/09/24 2017  BP: 139/68 (!) 166/74  114/65  Pulse: 77 84  76  Resp: 18 16  17   Temp: 98.9 F (37.2 C) 97.9 F (36.6 C)  99.9 F (37.7 C)  TempSrc:    Oral  SpO2: 100% 98%  96%  Weight:   57 kg   Height:   5' 2 (1.575 m)     Intake/Output Summary (Last 24 hours) at 07/10/2024 9076 Last data filed at 07/09/2024 1836 Gross per 24 hour  Intake 1904.9 ml  Output --  Net 1904.9 ml   Filed Weights   07/09/24 1415  Weight: 57 kg    Examination:  General exam: Appears calm and comfortable, awake alert, mumbles, unable to have a clear conversation this morning Respiratory system: Clear to auscultation Cardiovascular system: S1 & S2 heard, RRR.  Abd: nondistended, soft and nontender.Normal bowel sounds heard.  Suprapubic catheter Central nervous system: Awake alert,, mumbling, upper extremities stiff and contracted Extremities: no edema Skin: Healed skin ulcer on the sacrum Psychiatry:  Mood & affect appropriate.     Data Reviewed:   CBC: Recent Labs  Lab 07/04/24 1151 07/04/24 1203 07/08/24 1357 07/09/24 0608 07/10/24 0548  WBC  --  3.3* 4.8 3.4* 3.1*  NEUTROABS  --  2.4 3.8  --   --   HGB 11.6* 11.0* 10.6* 10.8* 10.2*  HCT 34.0* 34.8* 33.0* 33.0* 32.2*  MCV  --  98.9 98.8 96.8 97.3  PLT  --  252 209 210 211   Basic Metabolic Panel: Recent Labs  Lab 07/04/24 1151 07/08/24 1357 07/09/24 0608 07/10/24 0548  NA 138 139 141 143  K 3.8 4.0 3.9 3.7  CL 100 102 103 105  CO2  --  29 31 28   GLUCOSE 222* 107* 191* 139*  BUN 19 17 11 12   CREATININE 0.80 0.62 0.51 0.63  CALCIUM   --  8.8* 9.0 8.4*   GFR: Estimated Creatinine Clearance: 36.2 mL/min (by C-G formula based on SCr of 0.63 mg/dL). Liver Function Tests: Recent Labs  Lab 07/08/24 1357 07/09/24 0608  AST 18 16  ALT 6 6  ALKPHOS 47 44  BILITOT 0.5 0.6  PROT 6.3* 6.0*  ALBUMIN 3.8 3.7   No results for input(s): LIPASE, AMYLASE in the last 168  hours. No results for input(s): AMMONIA in the last 168 hours. Coagulation Profile: No results for input(s): INR, PROTIME in the last 168 hours. Cardiac Enzymes: No results for input(s): CKTOTAL, CKMB, CKMBINDEX, TROPONINI in the last 168 hours. BNP (last 3 results) No results for input(s): PROBNP in the last 8760 hours. HbA1C: No results for input(s): HGBA1C in the last 72 hours. CBG: Recent Labs  Lab 07/09/24 0714 07/09/24 1135 07/09/24 1629 07/09/24 2153 07/10/24 0723  GLUCAP 161* 222* 161* 157* 126*   Lipid Profile: No results for input(s): CHOL, HDL, LDLCALC, TRIG, CHOLHDL, LDLDIRECT in the last 72 hours. Thyroid  Function Tests: No results for input(s): TSH, T4TOTAL, FREET4, T3FREE, THYROIDAB in the last 72 hours. Anemia Panel: No results for input(s): VITAMINB12, FOLATE, FERRITIN, TIBC, IRON, RETICCTPCT in the last 72 hours. Urine analysis:    Component Value Date/Time   COLORURINE YELLOW 07/08/2024 1430   APPEARANCEUR CLOUDY (A) 07/08/2024 1430   LABSPEC 1.016 07/08/2024 1430   PHURINE 6.0 07/08/2024 1430   GLUCOSEU NEGATIVE 07/08/2024 1430   GLUCOSEU >=1000 (A) 12/16/2021 1447   HGBUR SMALL (A) 07/08/2024 1430   HGBUR trace-intact 07/20/2010 1433   BILIRUBINUR NEGATIVE 07/08/2024 1430   BILIRUBINUR negative 05/11/2021 1130   BILIRUBINUR NEG 08/09/2016 1500   KETONESUR NEGATIVE 07/08/2024 1430   PROTEINUR 100 (A) 07/08/2024 1430   UROBILINOGEN 0.2 12/16/2021 1447   NITRITE NEGATIVE 07/08/2024 1430   LEUKOCYTESUR LARGE (A) 07/08/2024 1430   Sepsis Labs: @LABRCNTIP (procalcitonin:4,lacticidven:4)  ) Recent Results (from the past 240 hours)  Culture, blood (routine x 2)     Status: Abnormal   Collection Time: 07/04/24 12:08 PM   Specimen: BLOOD  Result Value Ref Range Status   Specimen Description   Final    BLOOD RIGHT ANTECUBITAL Performed at [facility], [address]  [place], [place] [address]    Special Requests   Final    BOTTLES DRAWN AEROBIC AND ANAEROBIC Blood Culture results may not be optimal due to an inadequate volume of blood received in culture bottles Performed at [facility], [address] [place], [place] [address]    Culture  Setup Time   Final    GRAM POSITIVE COCCI IN CLUSTERS ANAEROBIC BOTTLE ONLY CRITICAL RESULT CALLED TO, READ BACK BY AND VERIFIED WITH: RN OLIVIA CORNELIA ON [id number] @1200  BY SM    Culture (A)  Final    STAPHYLOCOCCUS EPIDERMIDIS STAPHYLOCOCCUS HOMINIS THE SIGNIFICANCE OF ISOLATING THIS ORGANISM FROM A SINGLE SET OF BLOOD CULTURES WHEN MULTIPLE SETS ARE DRAWN IS UNCERTAIN. PLEASE NOTIFY THE MICROBIOLOGY DEPARTMENT WITHIN ONE WEEK IF SPECIATION AND SENSITIVITIES ARE REQUIRED. Performed at [facility], [address], [place], [place] [address]    Report Status 07/07/2024 FINAL  Final  Blood Culture ID Panel (Reflexed)     Status: Abnormal   Collection Time: 07/04/24 12:08 PM  Result Value Ref Range Status   Enterococcus  faecalis NOT DETECTED NOT DETECTED Final   Enterococcus Faecium NOT DETECTED NOT DETECTED Final   Listeria monocytogenes NOT DETECTED NOT DETECTED Final   Staphylococcus species DETECTED (A) NOT DETECTED Final    Comment: CRITICAL RESULT CALLED TO, READ BACK BY AND VERIFIED WITH: RN OLIVIA CORNELIA ON [id number] @1200  BY SM    Staphylococcus aureus (BCID) NOT DETECTED NOT DETECTED Final   Staphylococcus epidermidis DETECTED (A) NOT DETECTED Final    Comment: Methicillin (oxacillin) resistant coagulase negative staphylococcus. Possible blood culture contaminant (unless isolated from more than one blood culture draw or clinical case suggests pathogenicity). No antibiotic treatment is indicated for blood  culture contaminants. CRITICAL RESULT CALLED TO, READ BACK BY AND VERIFIED WITH: RN ELYSSE GODAIRE ON [id number] @1200  BY SM    Staphylococcus lugdunensis NOT DETECTED NOT DETECTED  Final   Streptococcus species NOT DETECTED NOT DETECTED Final   Streptococcus agalactiae NOT DETECTED NOT DETECTED Final   Streptococcus pneumoniae NOT DETECTED NOT DETECTED Final   Streptococcus pyogenes NOT DETECTED NOT DETECTED Final   A.calcoaceticus-baumannii NOT DETECTED NOT DETECTED Final   Bacteroides fragilis NOT DETECTED NOT DETECTED Final   Enterobacterales NOT DETECTED NOT DETECTED Final   Enterobacter cloacae complex NOT DETECTED NOT DETECTED Final   Escherichia coli NOT DETECTED NOT DETECTED Final   Klebsiella aerogenes NOT DETECTED NOT DETECTED Final   Klebsiella oxytoca NOT DETECTED NOT DETECTED Final   Klebsiella pneumoniae NOT DETECTED NOT DETECTED Final   Proteus species NOT DETECTED NOT DETECTED Final   Salmonella species NOT DETECTED NOT DETECTED Final   Serratia marcescens NOT DETECTED NOT DETECTED Final   Haemophilus influenzae NOT DETECTED NOT DETECTED Final   Neisseria meningitidis NOT DETECTED NOT DETECTED Final   Pseudomonas aeruginosa NOT DETECTED NOT DETECTED Final   Stenotrophomonas maltophilia NOT DETECTED NOT DETECTED Final   Candida albicans NOT DETECTED NOT DETECTED Final   Candida auris NOT DETECTED NOT DETECTED Final   Candida glabrata NOT DETECTED NOT DETECTED Final   Candida krusei NOT DETECTED NOT DETECTED Final   Candida parapsilosis NOT DETECTED NOT DETECTED Final   Candida tropicalis NOT DETECTED NOT DETECTED Final   Cryptococcus neoformans/gattii NOT DETECTED NOT DETECTED Final   Methicillin resistance mecA/C DETECTED (A) NOT DETECTED Final    Comment: CRITICAL RESULT CALLED TO, READ BACK BY AND VERIFIED WITH: RN OLIVIA CORNELIA ON [id number] @1200  BY SM Performed at [facility], [address], [place], [place] [address]   Culture, blood (Routine X 2) w Reflex to ID Panel     Status: None (Preliminary result)   Collection Time: 07/08/24  1:57 PM   Specimen: BLOOD LEFT ARM  Result Value Ref Range Status   Specimen Description   Final     BLOOD LEFT ARM Performed at [facility], [address], [place], [place] [address]    Special Requests   Final    BOTTLES DRAWN AEROBIC AND ANAEROBIC Blood Culture adequate volume Performed at [facility], [address] [place], [place] [address]    Culture   Final    NO GROWTH 2 DAYS Performed at [facility], [address], [place], [place] [address]    Report Status PENDING  Incomplete  Culture, blood (Routine X 2) w Reflex to ID Panel     Status: None (Preliminary result)   Collection Time: 07/08/24  1:57 PM   Specimen: BLOOD RIGHT ARM  Result Value Ref Range Status   Specimen Description  Final    BLOOD RIGHT ARM Performed at [facility], [address], [place], [place] [address]    Special Requests   Final    BOTTLES DRAWN AEROBIC AND ANAEROBIC Blood Culture adequate volume Performed at [facility], [address] [place], [place] [address]    Culture   Final    NO GROWTH 2 DAYS Performed at [facility], [address], [place], [place] [address]    Report Status PENDING  Incomplete  Urine Culture     Status: Abnormal   Collection Time: 07/08/24  2:30 PM   Specimen: Urine, Random  Result Value Ref Range Status   Specimen Description   Final    URINE, RANDOM Performed at [facility], [address], [place], [place] [address]    Special Requests   Final    URINE, SUPRAPUBIC Performed at [facility], [address], [place], [place] [address]    Culture (A)  Final    >=100,000 COLONIES/mL METHICILLIN RESISTANT STAPHYLOCOCCUS AUREUS   Report Status 07/10/2024 FINAL  Final   Organism ID, Bacteria METHICILLIN RESISTANT STAPHYLOCOCCUS AUREUS (A)  Final      Susceptibility   Methicillin resistant staphylococcus aureus - MIC*    CIPROFLOXACIN  >=8 RESISTANT Resistant     GENTAMICIN <=0.5 SENSITIVE Sensitive     NITROFURANTOIN  <=16 SENSITIVE Sensitive     OXACILLIN >=4  RESISTANT Resistant     TETRACYCLINE <=1 SENSITIVE Sensitive     VANCOMYCIN  1 SENSITIVE Sensitive     TRIMETH /SULFA  >=320 RESISTANT Resistant     RIFAMPIN <=0.5 SENSITIVE Sensitive     Inducible Clindamycin NEGATIVE Sensitive     LINEZOLID 2 SENSITIVE Sensitive     * >=100,000 COLONIES/mL METHICILLIN RESISTANT STAPHYLOCOCCUS AUREUS     Radiology Studies: DG Chest Port 1 View Result Date: 07/08/2024 CLINICAL DATA:  Sacral wound.  Bacteremia. EXAM: PORTABLE CHEST 1 VIEW COMPARISON:  04/03/2024. FINDINGS: Low lung volumes with bronchovascular crowding, similar to the prior exam. Cardiomediastinal silhouette is unchanged. Left-greater-than-right bibasilar subsegmental atelectasis. No sizable pleural effusion or pneumothorax. No acute osseous abnormality. IMPRESSION: Low lung volumes with bibasilar atelectasis. Electronically Signed   By: Harrietta Sherry M.D.   On: 07/08/2024 14:56     Scheduled Meds:  amLODipine   5 mg Oral Daily   atorvastatin   40 mg Oral Daily   brimonidine   1 drop Left Eye BID   dorzolamide -timolol   1 drop Both Eyes BID   enoxaparin  (LOVENOX ) injection  40 mg Subcutaneous Q24H   insulin  aspart  0-9 Units Subcutaneous TID WC   insulin  glargine-yfgn  6 Units Subcutaneous Daily   levothyroxine   50 mcg Oral Daily   metFORMIN   1,000 mg Oral Q breakfast   metoprolol  succinate  25 mg Oral Daily   mirabegron  ER  25 mg Oral Daily   polyethylene glycol powder  17 g Oral QODAY   tamsulosin   0.4 mg Oral Daily   Continuous Infusions:  sodium chloride  100 mL/hr at 07/09/24 2116     LOS: 0 days    Time spent:    Sigurd Pac, MD [facility]   07/10/2024, 9:23 AM

## 2024-07-10 NOTE — Plan of Care (Signed)

## 2024-07-10 NOTE — Plan of Care (Signed)
   Problem: Clinical Measurements: Goal: Diagnostic test results will improve Outcome: Progressing

## 2024-07-11 DIAGNOSIS — R7881 Bacteremia: Secondary | ICD-10-CM | POA: Diagnosis not present

## 2024-07-11 LAB — GLUCOSE, CAPILLARY
Glucose-Capillary: 148 mg/dL — ABNORMAL HIGH (ref 70–99)
Glucose-Capillary: 216 mg/dL — ABNORMAL HIGH (ref 70–99)

## 2024-07-11 LAB — BASIC METABOLIC PANEL WITH GFR
Anion gap: 8 (ref 5–15)
BUN: 11 mg/dL (ref 8–23)
CO2: 27 mmol/L (ref 22–32)
Calcium: 8.3 mg/dL — ABNORMAL LOW (ref 8.9–10.3)
Chloride: 107 mmol/L (ref 98–111)
Creatinine, Ser: 0.68 mg/dL (ref 0.44–1.00)
GFR, Estimated: >60 mL/min (ref >60)
Glucose, Bld: 166 mg/dL — ABNORMAL HIGH (ref 70–99)
Potassium: 3.6 mmol/L (ref 3.5–5.1)
Sodium: 142 mmol/L (ref 135–145)

## 2024-07-11 NOTE — Plan of Care (Signed)
 Palliative:  Chart reviewed by PMT per previous provider's request.  Discharge summary is in with plans to dc home today. No palliative needs noted.   Please reach out to PMT with any needs.  Tobey Jama Barnacle, DNP, AGNP-C Palliative Medicine Team Team Phone # [phone]  Pager # [phone]

## 2024-07-11 NOTE — Discharge Summary (Signed)
 Physician Discharge Summary  Hayley Fisher FMW:[id number] DOB: 1930-08-24 DOA: 07/08/2024  PCP: Lennie Raguel MATSU, DO  Admit date: 07/08/2024 Discharge date: 07/11/2024  Time spent: 45 minutes  Recommendations for Outpatient Follow-up:  Home with hospice services for comfort focused care  Discharge Diagnoses:  Principal Problem:   Positive blood culture Dementia Delirium   Hyperlipidemia associated with type 2 diabetes mellitus (HCC)   Hypertension associated with diabetes (HCC)   Unspecified dementia, severe, with other behavioral disturbance (HCC)   Type 2 diabetes mellitus with hyperglycemia, with long-term current use of insulin  (HCC)   Pressure ulcer of left buttock, stage 2 (HCC)   Glaucoma   Hypothyroidism   Constipation   Suprapubic catheter (HCC)   Insomnia   Sacral decubitus ulcer   Anemia of unknown etiology   Complicated UTI (urinary tract infection)   Discharge Condition: Fair  Diet recommendation: Comfort feeds  Filed Weights   07/09/24 1415  Weight: 57 kg    History of present illness:  Chronically ill 89/F with history of dementia, osteoarthritis, cataracts, chronic diarrhea, urinary retention with chronic Foley, history of DVT, IVC filter, sacral ulcer was seen in the ED 4 days ago for weakness, hyperglycemia, blood cultures drawn during that time ended up being positive for Staph epidermidis and hominis, asked to return to the hospital.  Family reported poor appetite, ongoing cognitive issues.  WBC 4.8, hemoglobin 10.6, chest x-ray with low lung volumes and bibasilar atelectasis, UA from catheter with greater than 50 WBCs, cloudy, large leukocyte esterase    Hospital Course:   Positive blood culture with staph epidermidis and hominis, 1 set - Suspect this was a skin contaminant, drawn in the ER 11/13, unfortunately only 1 set obtained then - Afebrile, no leukocytosis - Monitor off antibiotics, repeat blood cultures are negative   Dementia Delirium,  fluctuating mental status - Frail cachectic malnourished, contracted - Discussed poor prognosis with daughter, palliative consulted for goals of care, discussed consideration of hospice -Palliative meeting completed yesterday, plan for discharge home with hospice services   Pressure ulcer left buttock stage II - This has healed, no signs or symptoms of infection at this time   Abnormal urinalysis - No signs or symptoms of infection,  - Urine culture with Staph aureus, likely colonization of previous catheter, -suprapubic catheter exchanged in the ER 11/17   Dementia Total care   Type 2 diabetes mellitus CBGs are stable continue metformin , SSI   Hypertension Continue amlodipine  and metoprolol   Discharge Exam: Vitals:   07/11/24 0523 07/11/24 0908  BP: 127/69 127/69  Pulse: 83   Resp: 16   Temp: 98.4 F (36.9 C)   SpO2: 93%    General exam: Appears calm and comfortable, awake alert, oriented to self and partly to place, moderate cognitive deficits  respiratory system: Clear to auscultation Cardiovascular system: S1 & S2 heard, RRR.  Abd: nondistended, soft and nontender.Normal bowel sounds heard.  Suprapubic catheter Central nervous system: Awake alert,, partly oriented upper extremities stiff and contracted Extremities: no edema Skin: Healed skin ulcer on the sacrum Psychiatry: Pleasant, flat affect    Discharge Instructions   Discharge Instructions     Diet - low sodium heart healthy   Complete by: As directed    Discharge wound care:   Complete by: As directed    routine   Increase activity slowly   Complete by: As directed       Allergies as of 07/11/2024       Reactions   Doxepin   Hcl Other (See Comments)   Hallucinations increased   Rexulti  [brexpiprazole ] Other (See Comments)   Increase agitation   Levemir  [insulin  Detemir] Itching   Tramadol  Itching, Other (See Comments)   Takes occasionally with Benadryl         Medication List     STOP  taking these medications    mirtazapine 15 MG tablet Commonly known as: Remeron       TAKE these medications    acetaminophen  500 MG tablet Commonly known as: TYLENOL  Take 1,000 mg by mouth every 8 (eight) hours as needed for mild pain (pain score 1-3).   amLODipine  5 MG tablet Commonly known as: NORVASC  Take 1 tablet (5 mg total) by mouth daily.   atorvastatin  40 MG tablet Commonly known as: LIPITOR Take 1 tablet (40 mg total) by mouth daily.   brimonidine  0.2 % ophthalmic solution Commonly known as: ALPHAGAN  Place 1 drop into the left eye 2 (two) times daily.   dorzolamide -timolol  2-0.5 % ophthalmic solution Commonly known as: COSOPT  Place 1 drop into both eyes in the morning and at bedtime.   DuoDERM CGF Extra Thin Misc Apply 1 each topically every three (3) days as needed (Change every 3-5 days, or sooner if soiled.).   famotidine  20 MG tablet Commonly known as: PEPCID  Take 20 mg by mouth daily before breakfast.   FeroSul 325 (65 Fe) MG tablet Generic drug: ferrous sulfate  Take 325 mg by mouth daily with breakfast.   Lantus  SoloStar 100 UNIT/ML Solostar Pen Generic drug: insulin  glargine Inject 6 Units into the skin daily.   levothyroxine  50 MCG tablet Commonly known as: SYNTHROID  Take 1 tablet (50 mcg total) by mouth daily before breakfast.   metFORMIN  500 MG 24 hr tablet Commonly known as: GLUCOPHAGE -XR Take 2 tablets (1,000 mg total) by mouth daily with breakfast.   metoprolol  succinate 25 MG 24 hr tablet Commonly known as: TOPROL -XL Take 1 tablet (25 mg total) by mouth daily.   Myrbetriq  25 MG Tb24 tablet Generic drug: mirabegron  ER Take 1 tablet (25 mg total) by mouth daily.   polyethylene glycol powder 17 GM/SCOOP powder Commonly known as: GLYCOLAX /MIRALAX  Take 17 g by mouth in the morning and at bedtime. What changed:  when to take this reasons to take this   senna 8.6 MG Tabs tablet Commonly known as: SENOKOT Take 1 tablet (8.6 mg  total) by mouth 2 (two) times daily. What changed:  when to take this reasons to take this   tamsulosin  0.4 MG Caps capsule Commonly known as: FLOMAX  Take 0.4 mg by mouth daily.   traZODone  100 MG tablet Commonly known as: DESYREL  Take 1 tablet (100 mg total) by mouth at bedtime as needed for sleep.               Discharge Care Instructions  (From admission, onward)           Start     Ordered   07/11/24 0000  Discharge wound care:       Comments: routine   07/11/24 0816           Allergies  Allergen Reactions   Doxepin  Hcl Other (See Comments)    Hallucinations increased   Rexulti  [Brexpiprazole ] Other (See Comments)    Increase agitation   Levemir  [Insulin  Detemir] Itching   Tramadol  Itching and Other (See Comments)    Takes occasionally with Benadryl       The results of significant diagnostics from this hospitalization (including imaging, microbiology, ancillary and laboratory)  are listed below for reference.    Significant Diagnostic Studies: DG Chest Port 1 View Result Date: 07/08/2024 CLINICAL DATA:  Sacral wound.  Bacteremia. EXAM: PORTABLE CHEST 1 VIEW COMPARISON:  04/03/2024. FINDINGS: Low lung volumes with bronchovascular crowding, similar to the prior exam. Cardiomediastinal silhouette is unchanged. Left-greater-than-right bibasilar subsegmental atelectasis. No sizable pleural effusion or pneumothorax. No acute osseous abnormality. IMPRESSION: Low lung volumes with bibasilar atelectasis. Electronically Signed   By: Harrietta Sherry M.D.   On: 07/08/2024 14:56   CT Renal Stone Study Result Date: 06/22/2024 CLINICAL DATA:  Flank pain EXAM: CT ABDOMEN AND PELVIS WITHOUT CONTRAST TECHNIQUE: Multidetector CT imaging of the abdomen and pelvis was performed following the standard protocol without IV contrast. RADIATION DOSE REDUCTION: This exam was performed according to the departmental dose-optimization program which includes automated exposure  control, adjustment of the mA and/or kV according to patient size and/or use of iterative reconstruction technique. COMPARISON:  CT 05/31/2023, 05/02/2023, 08/08/2021 FINDINGS: Lower chest: Lung bases demonstrate atelectasis or pneumonia at the bases. Multi-vessel coronary vascular calcification. Aortic valvular calcification. Hepatobiliary: No focal liver abnormality is seen. No gallstones, gallbladder wall thickening, or biliary dilatation. Pancreas: Unremarkable. No pancreatic ductal dilatation or surrounding inflammatory changes. Spleen: Normal in size without focal abnormality. Adrenals/Urinary Tract: Adrenal glands are normal. No hydronephrosis. Large cyst left kidney, no imaging follow-up is recommended. Suprapubic catheter in the bladder. Diffuse thick-walled appearance of the bladder. Stomach/Bowel: Stomach nonenlarged. No dilated small bowel. No acute bowel wall thickening. Large stool burden throughout the colon Vascular/Lymphatic: Advanced aortic atherosclerosis. No aneurysm. IVC filter again noted. Reproductive: Hysterectomy.  No adnexal mass Other: No ascites or free air Musculoskeletal: No acute osseous abnormality IMPRESSION: 1. Negative for hydronephrosis or ureteral stone. 2. Suprapubic catheter in the bladder. Diffuse thick-walled appearance of the bladder, correlate with urinalysis 3. Large stool burden throughout the colon suggests constipation. 4. Aortic atherosclerosis. Aortic Atherosclerosis (ICD10-I70.0). Electronically Signed   By: Luke Bun M.D.   On: 06/22/2024 00:29    Microbiology: Recent Results (from the past 240 hours)  Culture, blood (routine x 2)     Status: Abnormal   Collection Time: 07/04/24 12:08 PM   Specimen: BLOOD  Result Value Ref Range Status   Specimen Description   Final    BLOOD RIGHT ANTECUBITAL Performed at [facility] [address] [place], [place] [address]    Special Requests   Final    BOTTLES DRAWN AEROBIC AND ANAEROBIC  Blood Culture results may not be optimal due to an inadequate volume of blood received in culture bottles Performed at [facility], [address] [place], [place] [address]    Culture  Setup Time   Final    GRAM POSITIVE COCCI IN CLUSTERS ANAEROBIC BOTTLE ONLY CRITICAL RESULT CALLED TO, READ BACK BY AND VERIFIED WITH: RN OLIVIA CORNELIA ON [id number] @1200  BY SM    Culture (A)  Final    STAPHYLOCOCCUS EPIDERMIDIS STAPHYLOCOCCUS HOMINIS THE SIGNIFICANCE OF ISOLATING THIS ORGANISM FROM A SINGLE SET OF BLOOD CULTURES WHEN MULTIPLE SETS ARE DRAWN IS UNCERTAIN. PLEASE NOTIFY THE MICROBIOLOGY DEPARTMENT WITHIN ONE WEEK IF SPECIATION AND SENSITIVITIES ARE REQUIRED. Performed at [facility], [address], [place], [place] [address]    Report Status 07/07/2024 FINAL  Final  Blood Culture ID Panel (Reflexed)     Status: Abnormal   Collection Time: 07/04/24 12:08 PM  Result Value Ref Range Status   Enterococcus faecalis NOT DETECTED NOT DETECTED Final  Enterococcus Faecium NOT DETECTED NOT DETECTED Final   Listeria monocytogenes NOT DETECTED NOT DETECTED Final   Staphylococcus species DETECTED (A) NOT DETECTED Final    Comment: CRITICAL RESULT CALLED TO, READ BACK BY AND VERIFIED WITH: RN OLIVIA CORNELIA ON [id number] @1200  BY SM    Staphylococcus aureus (BCID) NOT DETECTED NOT DETECTED Final   Staphylococcus epidermidis DETECTED (A) NOT DETECTED Final    Comment: Methicillin (oxacillin) resistant coagulase negative staphylococcus. Possible blood culture contaminant (unless isolated from more than one blood culture draw or clinical case suggests pathogenicity). No antibiotic treatment is indicated for blood  culture contaminants. CRITICAL RESULT CALLED TO, READ BACK BY AND VERIFIED WITH: RN OLIVIA CORNELIA ON [id number] @1200  BY SM    Staphylococcus lugdunensis NOT DETECTED NOT DETECTED Final   Streptococcus species NOT DETECTED NOT DETECTED Final   Streptococcus agalactiae  NOT DETECTED NOT DETECTED Final   Streptococcus pneumoniae NOT DETECTED NOT DETECTED Final   Streptococcus pyogenes NOT DETECTED NOT DETECTED Final   A.calcoaceticus-baumannii NOT DETECTED NOT DETECTED Final   Bacteroides fragilis NOT DETECTED NOT DETECTED Final   Enterobacterales NOT DETECTED NOT DETECTED Final   Enterobacter cloacae complex NOT DETECTED NOT DETECTED Final   Escherichia coli NOT DETECTED NOT DETECTED Final   Klebsiella aerogenes NOT DETECTED NOT DETECTED Final   Klebsiella oxytoca NOT DETECTED NOT DETECTED Final   Klebsiella pneumoniae NOT DETECTED NOT DETECTED Final   Proteus species NOT DETECTED NOT DETECTED Final   Salmonella species NOT DETECTED NOT DETECTED Final   Serratia marcescens NOT DETECTED NOT DETECTED Final   Haemophilus influenzae NOT DETECTED NOT DETECTED Final   Neisseria meningitidis NOT DETECTED NOT DETECTED Final   Pseudomonas aeruginosa NOT DETECTED NOT DETECTED Final   Stenotrophomonas maltophilia NOT DETECTED NOT DETECTED Final   Candida albicans NOT DETECTED NOT DETECTED Final   Candida auris NOT DETECTED NOT DETECTED Final   Candida glabrata NOT DETECTED NOT DETECTED Final   Candida krusei NOT DETECTED NOT DETECTED Final   Candida parapsilosis NOT DETECTED NOT DETECTED Final   Candida tropicalis NOT DETECTED NOT DETECTED Final   Cryptococcus neoformans/gattii NOT DETECTED NOT DETECTED Final   Methicillin resistance mecA/C DETECTED (A) NOT DETECTED Final    Comment: CRITICAL RESULT CALLED TO, READ BACK BY AND VERIFIED WITH: RN OLIVIA CORNELIA ON [id number] @1200  BY SM Performed at [facility], [address], [place], [place] [address]   Culture, blood (Routine X 2) w Reflex to ID Panel     Status: None (Preliminary result)   Collection Time: 07/08/24  1:57 PM   Specimen: BLOOD LEFT ARM  Result Value Ref Range Status   Specimen Description   Final    BLOOD LEFT ARM Performed at [facility], [address], [place], [place]  [address]    Special Requests   Final    BOTTLES DRAWN AEROBIC AND ANAEROBIC Blood Culture adequate volume Performed at [facility], [address] [place], [place] [address]    Culture   Final    NO GROWTH 3 DAYS Performed at [facility], [address], [place], [place] [address]    Report Status PENDING  Incomplete  Culture, blood (Routine X 2) w Reflex to ID Panel     Status: None (Preliminary result)   Collection Time: 07/08/24  1:57 PM   Specimen: BLOOD RIGHT ARM  Result Value Ref Range Status   Specimen Description   Final    BLOOD RIGHT ARM Performed  at [facility], [address], [place], [place] [address]    Special Requests   Final    BOTTLES DRAWN AEROBIC AND ANAEROBIC Blood Culture adequate volume Performed at [facility], [address], [place], [place] [address]    Culture   Final    NO GROWTH 3 DAYS Performed at [facility], [address] [place], [place] [address]    Report Status PENDING  Incomplete  Urine Culture     Status: Abnormal   Collection Time: 07/08/24  2:30 PM   Specimen: Urine, Random  Result Value Ref Range Status   Specimen Description   Final    URINE, RANDOM Performed at [facility], [address] [place], [place] [address]    Special Requests   Final    URINE, SUPRAPUBIC Performed at [facility], [address] [place], [place] [address]    Culture (A)  Final    >=100,000 COLONIES/mL METHICILLIN RESISTANT STAPHYLOCOCCUS AUREUS   Report Status 07/10/2024 FINAL  Final   Organism ID, Bacteria METHICILLIN RESISTANT STAPHYLOCOCCUS AUREUS (A)  Final      Susceptibility   Methicillin resistant staphylococcus aureus - MIC*    CIPROFLOXACIN  >=8 RESISTANT Resistant     GENTAMICIN <=0.5 SENSITIVE Sensitive     NITROFURANTOIN  <=16 SENSITIVE Sensitive     OXACILLIN >=4 RESISTANT Resistant     TETRACYCLINE <=1 SENSITIVE Sensitive     VANCOMYCIN  1  SENSITIVE Sensitive     TRIMETH /SULFA  >=320 RESISTANT Resistant     RIFAMPIN <=0.5 SENSITIVE Sensitive     Inducible Clindamycin NEGATIVE Sensitive     LINEZOLID 2 SENSITIVE Sensitive     * >=100,000 COLONIES/mL METHICILLIN RESISTANT STAPHYLOCOCCUS AUREUS     Labs: Basic Metabolic Panel: Recent Labs  Lab 07/04/24 1151 07/08/24 1357 07/09/24 0608 07/10/24 0548 07/11/24 0648  NA 138 139 141 143 142  K 3.8 4.0 3.9 3.7 3.6  CL 100 102 103 105 107  CO2  --  29 31 28 27   GLUCOSE 222* 107* 191* 139* 166*  BUN 19 17 11 12 11   CREATININE 0.80 0.62 0.51 0.63 0.68  CALCIUM   --  8.8* 9.0 8.4* 8.3*   Liver Function Tests: Recent Labs  Lab 07/08/24 1357 07/09/24 0608  AST 18 16  ALT 6 6  ALKPHOS 47 44  BILITOT 0.5 0.6  PROT 6.3* 6.0*  ALBUMIN 3.8 3.7   No results for input(s): LIPASE, AMYLASE in the last 168 hours. No results for input(s): AMMONIA in the last 168 hours. CBC: Recent Labs  Lab 07/04/24 1151 07/04/24 1203 07/08/24 1357 07/09/24 0608 07/10/24 0548  WBC  --  3.3* 4.8 3.4* 3.1*  NEUTROABS  --  2.4 3.8  --   --   HGB 11.6* 11.0* 10.6* 10.8* 10.2*  HCT 34.0* 34.8* 33.0* 33.0* 32.2*  MCV  --  98.9 98.8 96.8 97.3  PLT  --  252 209 210 211   Cardiac Enzymes: No results for input(s): CKTOTAL, CKMB, CKMBINDEX, TROPONINI in the last 168 hours. BNP: BNP (last 3 results) No results for input(s): BNP in the last 8760 hours.  ProBNP (last 3 results) No results for input(s): PROBNP in the last 8760 hours.  CBG: Recent Labs  Lab 07/10/24 0723 07/10/24 1150 07/10/24 1701 07/10/24 2053 07/11/24 0821  GLUCAP 126* 234* 186* 215* 148*       Signed:  Sigurd Pac MD.  [facility] 07/11/2024, 9:31 AM

## 2024-07-11 NOTE — TOC Transition Note (Addendum)
 Transition of Care [facility]) - Discharge Note   Patient Details  Name: Hayley Fisher MRN: [id number] Date of Birth: 1931-07-28  [facility]) CM/SW Contact:  Sonda Manuella Quill, RN Phone Number: 07/11/2024, 9:36 AM   Clinical Narrative:    D/C orders received; pt home w/ [facility]; spoke w/ pt's dtr Lonell Smack ; [phone]); she verified d/c address [address] [place], [place] [address]; she requested call when [facility] arrives for transport; Kelly, LPN notified; she requested [facility] be called in 1 hour.  -[phone]- [facility] called for transport; spoke w/ Parne; no IP CM needs.  Final next level of care: Home w Hospice Care Barriers to Discharge: No Barriers Identified   Patient Goals and CMS Choice Patient states their goals for this hospitalization and ongoing recovery are:: To return home with family CMS Medicare.gov Compare Post Acute Care list provided to:: Patient Represenative (must comment) (Pt daughter Lonell Smack) Choice offered to / list presented to : Adult Children [place] ownership interest in [facility].provided to:: Adult Children    Discharge Placement                  Name of family member notified: Lonell Smack (dtr) [phone] Patient and family notified of of transfer: 07/11/24  Discharge Plan and Services Additional resources added to the After Visit Summary for   In-house Referral: NA Discharge Planning Services: CM Consult Post Acute Care Choice: Durable Medical Equipment, Resumption of Svcs/PTA Provider (CAP services, Respite Care)          DME Arranged: N/A DME Agency: NA       HH Arranged: NA HH Agency: NA        Social Drivers of Health (SDOH) Interventions SDOH Screenings   Food Insecurity: Patient Declined (07/08/2024)  Housing: Patient Declined (07/08/2024)  Transportation Needs: Patient Declined (07/08/2024)  Utilities: Patient Declined (07/08/2024)  Alcohol  Screen: Low Risk  (10/26/2023)  Depression  (PHQ2-9): Low Risk  (06/06/2024)  Financial Resource Strain: Low Risk  (10/26/2023)  Physical Activity: Inactive (10/26/2023)  Social Connections: Patient Declined (07/08/2024)  Stress: No Stress Concern Present (10/26/2023)  Tobacco Use: Medium Risk (07/08/2024)  Health Literacy: Inadequate Health Literacy (10/26/2023)     Readmission Risk Interventions    03/15/2024    2:01 PM  Readmission Risk Prevention Plan  Transportation Screening Complete  PCP or Specialist Appt within 5-7 Days Complete  Home Care Screening Complete  Medication Review (RN CM) Complete

## 2024-07-13 LAB — CULTURE, BLOOD (ROUTINE X 2)
Culture: NO GROWTH
Culture: NO GROWTH
Special Requests: ADEQUATE
Special Requests: ADEQUATE

## 2024-07-15 ENCOUNTER — Encounter: Payer: Self-pay | Admitting: Family Medicine

## 2024-07-15 DIAGNOSIS — F03C18 Unspecified dementia, severe, with other behavioral disturbance: Secondary | ICD-10-CM

## 2024-07-15 DIAGNOSIS — F05 Delirium due to known physiological condition: Secondary | ICD-10-CM

## 2024-07-15 MED ORDER — HALOPERIDOL 2 MG PO TABS: ORAL_TABLET | ORAL | 0 refills | Status: DC

## 2024-07-16 ENCOUNTER — Telehealth: Payer: Self-pay

## 2024-07-16 NOTE — Addendum Note (Signed)
 Addended by: LENNIE RAGUEL MATSU on: 07/16/2024 09:31 AM   Modules accepted: Orders

## 2024-07-16 NOTE — Telephone Encounter (Signed)
 Star with [facility] calls nurse line on Friday.  She reports hospice has been ordered for the patient. She reports the family has chosen PCP to be hospice provider. Requesting VO for this.   I spoke with patients daughter and they are no longer interested in hospice care at this time.  Discussed with Star. She reports they have closed out her chart for hospice care at this time.   No further action needed at this time.

## 2024-07-26 ENCOUNTER — Other Ambulatory Visit: Payer: Self-pay

## 2024-07-26 DIAGNOSIS — F05 Delirium due to known physiological condition: Secondary | ICD-10-CM

## 2024-07-26 MED ORDER — HALOPERIDOL 2 MG PO TABS: ORAL_TABLET | ORAL | 0 refills | Status: DC

## 2024-07-29 MED ORDER — HALOPERIDOL 5 MG PO TABS: ORAL_TABLET | ORAL | 0 refills | Status: DC

## 2024-07-29 NOTE — Addendum Note (Signed)
 Addended by: LENNIE RAGUEL MATSU on: 07/29/2024 02:36 PM   Modules accepted: Orders

## 2024-08-01 ENCOUNTER — Telehealth: Payer: Self-pay

## 2024-08-01 DIAGNOSIS — Z515 Encounter for palliative care: Secondary | ICD-10-CM

## 2024-08-01 NOTE — Telephone Encounter (Signed)
 PJ with [facility] calls nurse line requesting a referral to hospice care.  She reports on 12/10 the family agreed to transition to hospice care.   She reports once the referral is placed we can fax to: [phone].  Advised will forward to PCP to place referral.

## 2024-08-22 DEATH — deceased

## 2024-09-02 ENCOUNTER — Ambulatory Visit: Admitting: Podiatry

## 2024-09-24 ENCOUNTER — Telehealth: Admitting: Internal Medicine

## 2024-10-28 ENCOUNTER — Encounter

## 2024-11-27 ENCOUNTER — Ambulatory Visit: Admitting: Neurology
